# Patient Record
Sex: Male | Born: 1940
Health system: Southern US, Community
[De-identification: ages and names within clinical notes are randomized; demographics above are authoritative.]

## PROBLEM LIST (undated history)

## (undated) DIAGNOSIS — I10 Essential (primary) hypertension: Secondary | ICD-10-CM

## (undated) DIAGNOSIS — R519 Headache, unspecified: Secondary | ICD-10-CM

## (undated) DIAGNOSIS — K635 Polyp of colon: Secondary | ICD-10-CM

## (undated) DIAGNOSIS — I340 Nonrheumatic mitral (valve) insufficiency: Secondary | ICD-10-CM

## (undated) DIAGNOSIS — I2510 Atherosclerotic heart disease of native coronary artery without angina pectoris: Secondary | ICD-10-CM

## (undated) DIAGNOSIS — M1990 Unspecified osteoarthritis, unspecified site: Secondary | ICD-10-CM

## (undated) DIAGNOSIS — R1310 Dysphagia, unspecified: Secondary | ICD-10-CM

## (undated) DIAGNOSIS — E1121 Type 2 diabetes mellitus with diabetic nephropathy: Secondary | ICD-10-CM

## (undated) DIAGNOSIS — E785 Hyperlipidemia, unspecified: Secondary | ICD-10-CM

## (undated) DIAGNOSIS — D696 Thrombocytopenia, unspecified: Secondary | ICD-10-CM

## (undated) DIAGNOSIS — N183 Chronic kidney disease, stage 3 unspecified: Secondary | ICD-10-CM

## (undated) DIAGNOSIS — D500 Iron deficiency anemia secondary to blood loss (chronic): Secondary | ICD-10-CM

## (undated) DIAGNOSIS — C9110 Chronic lymphocytic leukemia of B-cell type not having achieved remission: Secondary | ICD-10-CM

## (undated) DIAGNOSIS — R001 Bradycardia, unspecified: Secondary | ICD-10-CM

## (undated) DIAGNOSIS — E039 Hypothyroidism, unspecified: Secondary | ICD-10-CM

## (undated) DIAGNOSIS — M21949 Unspecified acquired deformity of hand, unspecified hand: Secondary | ICD-10-CM

## (undated) DIAGNOSIS — K922 Gastrointestinal hemorrhage, unspecified: Secondary | ICD-10-CM

## (undated) DIAGNOSIS — R609 Edema, unspecified: Secondary | ICD-10-CM

## (undated) DIAGNOSIS — H409 Unspecified glaucoma: Secondary | ICD-10-CM

## (undated) DIAGNOSIS — N281 Cyst of kidney, acquired: Secondary | ICD-10-CM

## (undated) DIAGNOSIS — I779 Disorder of arteries and arterioles, unspecified: Secondary | ICD-10-CM

## (undated) DIAGNOSIS — R51 Headache: Secondary | ICD-10-CM

## (undated) DIAGNOSIS — D649 Anemia, unspecified: Secondary | ICD-10-CM

## (undated) HISTORY — DX: Gastrointestinal hemorrhage, unspecified: K92.2

## (undated) HISTORY — DX: Hypothyroidism, unspecified: E03.9

## (undated) HISTORY — DX: Chronic lymphocytic leukemia of B-cell type not having achieved remission: C91.10

## (undated) HISTORY — DX: Iron deficiency anemia secondary to blood loss (chronic): D50.0

## (undated) HISTORY — DX: Headache, unspecified: R51.9

## (undated) HISTORY — DX: Thrombocytopenia, unspecified: D69.6

## (undated) HISTORY — DX: Unspecified osteoarthritis, unspecified site: M19.90

## (undated) HISTORY — DX: Edema, unspecified: R60.9

## (undated) HISTORY — DX: Polyp of colon: K63.5

## (undated) HISTORY — DX: Disorder of arteries and arterioles, unspecified: I77.9

## (undated) HISTORY — DX: Nonrheumatic mitral (valve) insufficiency: I34.0

## (undated) HISTORY — DX: Atherosclerotic heart disease of native coronary artery without angina pectoris: I25.10

## (undated) HISTORY — DX: Type 2 diabetes mellitus with diabetic nephropathy: E11.21

## (undated) HISTORY — PX: PTCA: SHX146

## (undated) HISTORY — DX: Chronic kidney disease, stage 3 unspecified: N18.30

## (undated) HISTORY — DX: Chronic kidney disease, stage 3 (moderate): N18.3

## (undated) HISTORY — DX: Bradycardia, unspecified: R00.1

## (undated) HISTORY — DX: Dysphagia, unspecified: R13.10

## (undated) HISTORY — DX: Cyst of kidney, acquired: N28.1

## (undated) HISTORY — DX: Hyperlipidemia, unspecified: E78.5

## (undated) HISTORY — DX: Unspecified acquired deformity of hand, unspecified hand: M21.949

## (undated) HISTORY — DX: Anemia, unspecified: D64.9

## (undated) HISTORY — DX: Headache: R51

## (undated) HISTORY — PX: BACK SURGERY: SHX140

## (undated) HISTORY — DX: Unspecified glaucoma: H40.9

---

## 1960-03-04 DIAGNOSIS — M21949 Unspecified acquired deformity of hand, unspecified hand: Secondary | ICD-10-CM | POA: Insufficient documentation

## 1960-03-04 HISTORY — DX: Unspecified acquired deformity of hand, unspecified hand: M21.949

## 2001-10-22 ENCOUNTER — Encounter (INDEPENDENT_AMBULATORY_CARE_PROVIDER_SITE_OTHER): Payer: Self-pay | Admitting: Specialist

## 2001-10-22 ENCOUNTER — Ambulatory Visit (HOSPITAL_COMMUNITY): Admission: RE | Admit: 2001-10-22 | Discharge: 2001-10-22 | Payer: Self-pay | Admitting: *Deleted

## 2002-05-12 ENCOUNTER — Encounter: Admission: RE | Admit: 2002-05-12 | Discharge: 2002-08-10 | Payer: Self-pay | Admitting: Endocrinology

## 2005-06-26 ENCOUNTER — Encounter: Admission: RE | Admit: 2005-06-26 | Discharge: 2005-06-26 | Payer: Self-pay | Admitting: Nephrology

## 2006-12-01 ENCOUNTER — Ambulatory Visit (HOSPITAL_COMMUNITY): Admission: RE | Admit: 2006-12-01 | Discharge: 2006-12-01 | Payer: Self-pay | Admitting: *Deleted

## 2006-12-01 ENCOUNTER — Encounter (INDEPENDENT_AMBULATORY_CARE_PROVIDER_SITE_OTHER): Payer: Self-pay | Admitting: *Deleted

## 2008-11-02 HISTORY — PX: COLONOSCOPY: SHX174

## 2009-10-24 ENCOUNTER — Ambulatory Visit: Payer: Self-pay | Admitting: Endocrinology

## 2010-03-04 HISTORY — PX: EYE SURGERY: SHX253

## 2010-07-17 NOTE — Op Note (Signed)
NAME:  Scott [name], Scott [name]                ACCOUNT NO.:  [id number]   MEDICAL RECORD NO.:  [id number]          PATIENT TYPE:  AMB   LOCATION:  ENDO                         FACILITY:  [facility]   PHYSICIAN:  Georgiana Spinner, M.D.    DATE OF BIRTH:  [birth year]   DATE OF PROCEDURE:  12/01/2006  DATE OF DISCHARGE:                               OPERATIVE REPORT   PROCEDURE:  Colonoscopy.   INDICATIONS:  Colon polyp.   ANESTHESIA:  Fentanyl 100 mcg, Versed 9 mg.   PROCEDURE:  With the patient mildly sedated in the left lateral  decubitus position, the Pentax videoscopic colonoscope was inserted in  the rectum after what appeared to be a normal rectal exam.  Prostate  felt normal to my examination.  The colonoscope was passed under direct  vision to the cecum identified by ileocecal valve and appendiceal  orifice, both of which were photographed.  From this point the  colonoscope was slowly withdrawn taking circumferential views of colonic  mucosa, stopping at 40 cm from the anal verge at which point a polyp was  seen, photographed and removed using hot biopsy forceps technique,  setting of 20/150 blended current.  We next stopped in the rectum where  a second polyp was seen, photographed and it too was removed using the  hot biopsy forceps technique with the same setting.  We then placed the  endoscope and retroflexed view to view the anal canal from above.  Internal hemorrhoids were seen.  The endoscope was straightened and  withdrawn.  The patient's vital signs and pulse oximeter remained  stable.  The patient tolerated the procedure well without apparent  complications.   FINDINGS:  Diverticulosis scattered throughout the colon but fairly  mild, internal hemorrhoids, polyps as described above 40 cm from anal  verge in the rectum.  Await biopsy report.  The patient will call me for  results and follow-up with me as an outpatient.           ______________________________  Georgiana Spinner,  M.D.     GMO/MEDQ  D:  12/01/2006  T:  12/01/2006  Job:  [id number]

## 2010-07-20 NOTE — Op Note (Signed)
   TNAME:  Aure, FLORIAN CHAUCA                         ACCOUNT NO.:  [id number]   MEDICAL RECORD NO.:  [id number]                   PATIENT TYPE:  AMB   LOCATION:  ENDO                                 FACILITY:  [facility]   PHYSICIAN:  Georgiana Spinner, M.D.                 DATE OF BIRTH:  04-24-1940   DATE OF PROCEDURE:  10/22/2001  DATE OF DISCHARGE:                                 OPERATIVE REPORT   PROCEDURE:  Upper endoscopy.   INDICATIONS FOR PROCEDURE:  Gastroesophageal reflux disease, heme positive.   ANESTHESIA:  Demerol 50, Versed 6 mg.   DESCRIPTION OF PROCEDURE:  With the patient mildly sedated in the left  lateral decubitus position, the Olympus videoscopic endoscope was inserted  in the mouth and passed under direct vision through the esophagus which  appeared normal into the stomach where the fundus was found to be  erythematous and showed changes of snake skinning which was photographed and  biopsied. The body, antrum, duodenal bulb and second portion of the duodenum  all appeared normal. From this point, the endoscope was slowly withdrawn  taking circumferential views, the entire duodenal mucosa visualized,  until  the endoscope was then pulled back in the stomach, placed in retroflexion to  view the stomach from below. The endoscope was straightened and withdrawn  taking circumferential views of the remaining gastric and esophageal mucosa.  The patient's vital signs and pulse oximeter remained stable. The patient  tolerated the procedure well without apparent complications.   FINDINGS:  Changes of gastritis and fundus of stomach biopsied. Await biopsy  report. The patient to call me for results and followup with me as an  outpatient. Proceed to colonoscopy.                                                Georgiana Spinner, M.D.    GMO/MEDQ  D:  10/22/2001  T:  10/23/2001  Job:  [id number]

## 2010-07-20 NOTE — Op Note (Signed)
   TNAME:  Scott [name], ACIE CUSTIS                         ACCOUNT NO.:  [id number]   MEDICAL RECORD NO.:  [id number]                   PATIENT TYPE:  AMB   LOCATION:  ENDO                                 FACILITY:  [facility]   PHYSICIAN:  Georgiana Spinner, M.D.                 DATE OF BIRTH:  [birth year]   DATE OF PROCEDURE:  DATE OF DISCHARGE:                                 OPERATIVE REPORT   PROCEDURE:  Colonoscopy   INDICATIONS:  Hemoccult positivity   ANESTHESIA:  Demerol 20, Versed 2mg    PROCEDURE:  The patient was mildly sedated and laid flat in a decubitus  position. The Olympus videoscopic colonoscope was inserted in the rectum and  passed under direct vision to the cecum, identified by ileocecal valve and  appendiceal orifice. Prep was suboptimal in the cecal area despite washings  and suctioning. From this point, the colonoscope was slowly withdrawn taking  circle views of the entire colon  stopping only in the rectum, which  appeared normal. The rectum showed small hemorrhoids in retroflex. The  endoscope was straightened and withdrawn. The patient's vital signs and  pulse oximeter remained stable. Patient tolerated the procedure well without  complications following a negative colonoscopic examination limited somewhat  by prep in the right colon.   PLAN:  Repeat examination possibly in five years.                                                 Georgiana Spinner, M.D.    GMO/MEDQ  D:  10/22/2001  T:  10/22/2001  Job:  [id number]

## 2011-03-06 DIAGNOSIS — E0789 Other specified disorders of thyroid: Secondary | ICD-10-CM | POA: Diagnosis not present

## 2011-03-06 DIAGNOSIS — E119 Type 2 diabetes mellitus without complications: Secondary | ICD-10-CM | POA: Diagnosis not present

## 2011-03-06 DIAGNOSIS — Z125 Encounter for screening for malignant neoplasm of prostate: Secondary | ICD-10-CM | POA: Diagnosis not present

## 2011-03-06 DIAGNOSIS — I10 Essential (primary) hypertension: Secondary | ICD-10-CM | POA: Diagnosis not present

## 2011-03-06 DIAGNOSIS — E789 Disorder of lipoprotein metabolism, unspecified: Secondary | ICD-10-CM | POA: Diagnosis not present

## 2011-03-06 LAB — CBC
HEMOGLOBIN: 12 g/dL
PLATELET COUNT: 121
WBC: 5

## 2011-03-11 DIAGNOSIS — N289 Disorder of kidney and ureter, unspecified: Secondary | ICD-10-CM | POA: Diagnosis not present

## 2011-03-11 DIAGNOSIS — E119 Type 2 diabetes mellitus without complications: Secondary | ICD-10-CM | POA: Diagnosis not present

## 2011-03-11 DIAGNOSIS — E789 Disorder of lipoprotein metabolism, unspecified: Secondary | ICD-10-CM | POA: Diagnosis not present

## 2011-03-11 DIAGNOSIS — R030 Elevated blood-pressure reading, without diagnosis of hypertension: Secondary | ICD-10-CM | POA: Diagnosis not present

## 2011-03-11 DIAGNOSIS — I10 Essential (primary) hypertension: Secondary | ICD-10-CM | POA: Diagnosis not present

## 2011-03-12 DIAGNOSIS — N2581 Secondary hyperparathyroidism of renal origin: Secondary | ICD-10-CM | POA: Diagnosis not present

## 2011-04-25 DIAGNOSIS — I10 Essential (primary) hypertension: Secondary | ICD-10-CM | POA: Diagnosis not present

## 2011-04-25 DIAGNOSIS — N289 Disorder of kidney and ureter, unspecified: Secondary | ICD-10-CM | POA: Diagnosis not present

## 2011-04-25 DIAGNOSIS — E7800 Pure hypercholesterolemia, unspecified: Secondary | ICD-10-CM | POA: Diagnosis not present

## 2011-04-25 DIAGNOSIS — E119 Type 2 diabetes mellitus without complications: Secondary | ICD-10-CM | POA: Diagnosis not present

## 2011-06-04 DIAGNOSIS — E119 Type 2 diabetes mellitus without complications: Secondary | ICD-10-CM | POA: Diagnosis not present

## 2011-06-04 DIAGNOSIS — E7800 Pure hypercholesterolemia, unspecified: Secondary | ICD-10-CM | POA: Diagnosis not present

## 2011-06-06 DIAGNOSIS — N289 Disorder of kidney and ureter, unspecified: Secondary | ICD-10-CM | POA: Diagnosis not present

## 2011-06-06 DIAGNOSIS — I10 Essential (primary) hypertension: Secondary | ICD-10-CM | POA: Diagnosis not present

## 2011-06-06 DIAGNOSIS — E7800 Pure hypercholesterolemia, unspecified: Secondary | ICD-10-CM | POA: Diagnosis not present

## 2011-07-31 DIAGNOSIS — H409 Unspecified glaucoma: Secondary | ICD-10-CM | POA: Diagnosis not present

## 2011-07-31 DIAGNOSIS — H2510 Age-related nuclear cataract, unspecified eye: Secondary | ICD-10-CM | POA: Diagnosis not present

## 2011-07-31 DIAGNOSIS — H4011X0 Primary open-angle glaucoma, stage unspecified: Secondary | ICD-10-CM | POA: Diagnosis not present

## 2011-08-06 DIAGNOSIS — M722 Plantar fascial fibromatosis: Secondary | ICD-10-CM | POA: Diagnosis not present

## 2011-09-03 DIAGNOSIS — M722 Plantar fascial fibromatosis: Secondary | ICD-10-CM | POA: Diagnosis not present

## 2011-09-04 DIAGNOSIS — E7800 Pure hypercholesterolemia, unspecified: Secondary | ICD-10-CM | POA: Diagnosis not present

## 2011-09-04 LAB — HEMOGLOBIN A1C: A1c: 7.5

## 2011-09-04 LAB — COMPREHENSIVE METABOLIC PANEL
ALT: 23 U/L (ref 10–40)
Creat: 1.9
Glucose: 210
Potassium: 4.4 mmol/L
Sodium: 140 mmol/L (ref 137–147)
Total Bilirubin: 0.6 mg/dL

## 2011-09-04 LAB — LIPID PANEL
Cholesterol, Total: 153
Direct LDL: 84
HDL: 53 mg/dL (ref 35–70)
Triglycerides: 78

## 2011-09-09 DIAGNOSIS — N289 Disorder of kidney and ureter, unspecified: Secondary | ICD-10-CM | POA: Diagnosis not present

## 2011-09-09 DIAGNOSIS — I10 Essential (primary) hypertension: Secondary | ICD-10-CM | POA: Diagnosis not present

## 2011-09-09 DIAGNOSIS — E7800 Pure hypercholesterolemia, unspecified: Secondary | ICD-10-CM | POA: Diagnosis not present

## 2011-09-09 DIAGNOSIS — E1129 Type 2 diabetes mellitus with other diabetic kidney complication: Secondary | ICD-10-CM | POA: Diagnosis not present

## 2011-10-01 DIAGNOSIS — M722 Plantar fascial fibromatosis: Secondary | ICD-10-CM | POA: Diagnosis not present

## 2011-10-09 ENCOUNTER — Observation Stay (HOSPITAL_COMMUNITY)
Admission: EM | Admit: 2011-10-09 | Discharge: 2011-10-11 | Disposition: A | Discharge status: Home / Self Care | Payer: Medicare Other | Attending: Cardiology | Admitting: Cardiology

## 2011-10-09 ENCOUNTER — Emergency Department (HOSPITAL_COMMUNITY): Payer: Medicare Other

## 2011-10-09 ENCOUNTER — Encounter (HOSPITAL_COMMUNITY): Payer: Self-pay | Admitting: Adult Health

## 2011-10-09 DIAGNOSIS — I2510 Atherosclerotic heart disease of native coronary artery without angina pectoris: Secondary | ICD-10-CM | POA: Diagnosis present

## 2011-10-09 DIAGNOSIS — I10 Essential (primary) hypertension: Secondary | ICD-10-CM | POA: Diagnosis present

## 2011-10-09 DIAGNOSIS — N189 Chronic kidney disease, unspecified: Secondary | ICD-10-CM | POA: Insufficient documentation

## 2011-10-09 DIAGNOSIS — E119 Type 2 diabetes mellitus without complications: Secondary | ICD-10-CM | POA: Diagnosis not present

## 2011-10-09 DIAGNOSIS — I498 Other specified cardiac arrhythmias: Secondary | ICD-10-CM | POA: Diagnosis not present

## 2011-10-09 DIAGNOSIS — E039 Hypothyroidism, unspecified: Secondary | ICD-10-CM | POA: Diagnosis present

## 2011-10-09 DIAGNOSIS — R55 Syncope and collapse: Secondary | ICD-10-CM | POA: Diagnosis not present

## 2011-10-09 DIAGNOSIS — I129 Hypertensive chronic kidney disease with stage 1 through stage 4 chronic kidney disease, or unspecified chronic kidney disease: Secondary | ICD-10-CM | POA: Diagnosis not present

## 2011-10-09 DIAGNOSIS — I200 Unstable angina: Secondary | ICD-10-CM

## 2011-10-09 DIAGNOSIS — R079 Chest pain, unspecified: Principal | ICD-10-CM | POA: Insufficient documentation

## 2011-10-09 DIAGNOSIS — Z9861 Coronary angioplasty status: Secondary | ICD-10-CM | POA: Diagnosis not present

## 2011-10-09 DIAGNOSIS — R42 Dizziness and giddiness: Secondary | ICD-10-CM

## 2011-10-09 DIAGNOSIS — R51 Headache: Secondary | ICD-10-CM | POA: Diagnosis not present

## 2011-10-09 DIAGNOSIS — R0602 Shortness of breath: Secondary | ICD-10-CM | POA: Diagnosis not present

## 2011-10-09 DIAGNOSIS — R001 Bradycardia, unspecified: Secondary | ICD-10-CM | POA: Diagnosis present

## 2011-10-09 DIAGNOSIS — R519 Headache, unspecified: Secondary | ICD-10-CM

## 2011-10-09 HISTORY — DX: Essential (primary) hypertension: I10

## 2011-10-09 LAB — BASIC METABOLIC PANEL
BUN: 32 mg/dL — ABNORMAL HIGH (ref 6–23)
CO2: 24 mEq/L (ref 19–32)
Chloride: 111 mEq/L (ref 96–112)
Creatinine, Ser: 2.38 mg/dL — ABNORMAL HIGH (ref 0.50–1.35)

## 2011-10-09 LAB — CBC
HCT: 34.7 % — ABNORMAL LOW (ref 39.0–52.0)
Hemoglobin: 11.6 g/dL — ABNORMAL LOW (ref 13.0–17.0)
MCH: 29.7 pg (ref 26.0–34.0)
MCHC: 33.4 g/dL (ref 30.0–36.0)
RDW: 13.1 % (ref 11.5–15.5)

## 2011-10-09 MED ORDER — NITROGLYCERIN 0.4 MG SL SUBL: 0.4000 mg | SUBLINGUAL_TABLET | SUBLINGUAL | Status: DC | PRN

## 2011-10-09 MED ORDER — ASPIRIN 325 MG PO TABS
325.0000 mg | ORAL_TABLET | ORAL | Status: AC
  Administered 2011-10-09: 325 mg via ORAL
  Filled 2011-10-09: qty 1

## 2011-10-09 NOTE — ED Provider Notes (Signed)
History     CSN: [id number]  Arrival date & time 10/09/11  2122   First MD Initiated Contact with Patient 10/09/11 2145      Chief Complaint  Patient presents with  . Chest Pain    HPI One week of CP feeling dizzy, associated with nausea. Pain located on left side of chest and is a pressure. Pt took one nitro today and it decreased the pressure.  Past Medical History  Diagnosis Date  . Myocardial infarct   . Diabetes mellitus   . Hypertension   . Renal disorder   . Thyroid disease     Past Surgical History  Procedure Date  . Angioplasty     History reviewed. No pertinent family history.  History  Substance Use Topics  . Smoking status: Never Smoker   . Smokeless tobacco: Not on file  . Alcohol Use: No      Review of Systems  All other systems reviewed and are negative.    Allergies  Review of patient's allergies indicates no known allergies.  Home Medications   Current Outpatient Rx  Name Route Sig Dispense Refill  . ASPIRIN 325 MG PO TABS Oral Take 325 mg by mouth daily.    Marland Kitchen CARVEDILOL 25 MG PO TABS Oral Take 25 mg by mouth 2 (two) times daily with a meal.    . FUROSEMIDE 40 MG PO TABS Oral Take 20 mg by mouth daily as needed. For fluid retention    . INSULIN LISPRO (HUMAN) 100 UNIT/ML [employer] SOLN Subcutaneous Inject 20-28 Units into the skin 2 (two) times daily. 28 in a.m. 20 in p.m.    Marland Kitchen LEVOTHYROXINE SODIUM 50 MCG PO TABS Oral Take 50 mcg by mouth daily.    Marland Kitchen NIACIN ER (ANTIHYPERLIPIDEMIC) 1000 MG PO TBCR Oral Take 1,000 mg by mouth at bedtime.    Marland Kitchen ROSUVASTATIN CALCIUM 20 MG PO TABS Oral Take 20 mg by mouth daily.    . TELMISARTAN 80 MG PO TABS Oral Take 80 mg by mouth daily.      BP 143/69  Pulse 61  Temp 97.9 F (36.6 C) (Oral)  Resp 14  SpO2 96%  Physical Exam  Nursing note and vitals reviewed. Constitutional: He is oriented to person, place, and time. He appears well-developed. No distress.  HENT:  Head: Normocephalic and atraumatic.    Eyes: Pupils are equal, round, and reactive to light.  Neck: Normal range of motion.  Cardiovascular: Intact distal pulses.  Bradycardia present.        Sinus bradycardia Rate = 55 Axis: Borderline left axis deviation Interpretation: Abnormal EKG  Pulmonary/Chest: No respiratory distress.  Abdominal: Normal appearance. He exhibits no distension.  Musculoskeletal: Normal range of motion.  Neurological: He is alert and oriented to person, place, and time. No cranial nerve deficit.  Skin: Skin is warm and dry. No rash noted.  Psychiatric: He has a normal mood and affect. His behavior is normal.    ED Course  Procedures (including critical care time)  Labs Reviewed  CBC - Abnormal; Notable for the following:    RBC 3.90 (*)     Hemoglobin 11.6 (*)     HCT 34.7 (*)     Platelets 97 (*)  PLATELET COUNT CONFIRMED BY SMEAR   All other components within normal limits  BASIC METABOLIC PANEL - Abnormal; Notable for the following:    Sodium 146 (*)     Glucose, Bld 104 (*)     BUN 32 (*)  Creatinine, Ser 2.38 (*)     GFR calc non Af Amer 26 (*)     GFR calc Af Amer 30 (*)     All other components within normal limits  POCT I-STAT TROPONIN I   Dg Chest Portable 1 View  10/09/2011  *RADIOLOGY REPORT*  Clinical Data: Chest pain, shortness of breath  PORTABLE CHEST - 1 VIEW  Comparison: 03/11/2011  Findings: Low volume exam.  No focal pneumonia, edema, effusion or pneumothorax. Prominent heart size and vascularity.  IMPRESSION: Low volume exam without acute process  Original Report Authenticated By: Judie Petit. Ruel Favors, M.D.     1. Unstable angina       MDM  Patient admitted to cardiology        Nelia Shi, MD 10/09/11 2351

## 2011-10-09 NOTE — ED Notes (Signed)
One week of CP feeling dizzy, associated with nausea. Pain located on left side of chest and is a pressure. Pt took one nitro today and it decreased the pressure.

## 2011-10-10 ENCOUNTER — Encounter (HOSPITAL_COMMUNITY): Payer: Self-pay | Admitting: Internal Medicine

## 2011-10-10 ENCOUNTER — Inpatient Hospital Stay (HOSPITAL_COMMUNITY): Payer: Medicare Other

## 2011-10-10 DIAGNOSIS — R001 Bradycardia, unspecified: Secondary | ICD-10-CM | POA: Insufficient documentation

## 2011-10-10 DIAGNOSIS — R42 Dizziness and giddiness: Secondary | ICD-10-CM | POA: Diagnosis not present

## 2011-10-10 DIAGNOSIS — I10 Essential (primary) hypertension: Secondary | ICD-10-CM | POA: Diagnosis present

## 2011-10-10 DIAGNOSIS — R51 Headache: Secondary | ICD-10-CM | POA: Diagnosis not present

## 2011-10-10 DIAGNOSIS — R519 Headache, unspecified: Secondary | ICD-10-CM

## 2011-10-10 DIAGNOSIS — I2510 Atherosclerotic heart disease of native coronary artery without angina pectoris: Secondary | ICD-10-CM

## 2011-10-10 DIAGNOSIS — R55 Syncope and collapse: Secondary | ICD-10-CM | POA: Diagnosis not present

## 2011-10-10 DIAGNOSIS — I200 Unstable angina: Secondary | ICD-10-CM

## 2011-10-10 DIAGNOSIS — E039 Hypothyroidism, unspecified: Secondary | ICD-10-CM

## 2011-10-10 HISTORY — DX: Atherosclerotic heart disease of native coronary artery without angina pectoris: I25.10

## 2011-10-10 HISTORY — DX: Bradycardia, unspecified: R00.1

## 2011-10-10 HISTORY — DX: Hypothyroidism, unspecified: E03.9

## 2011-10-10 LAB — CARDIAC PANEL(CRET KIN+CKTOT+MB+TROPI)
CK, MB: 3.5 ng/mL (ref 0.3–4.0)
Relative Index: 2.7 — ABNORMAL HIGH (ref 0.0–2.5)
Total CK: 161 U/L (ref 7–232)
Total CK: 129 U/L (ref 7–232)
Total CK: 107 U/L (ref 7–232)
Troponin I: <0.3 ng/mL (ref <0.30)

## 2011-10-10 LAB — BASIC METABOLIC PANEL
BUN: 30 mg/dL — ABNORMAL HIGH (ref 6–23)
CO2: 23 mEq/L (ref 19–32)
Calcium: 9.3 mg/dL (ref 8.4–10.5)
Creatinine, Ser: 2.27 mg/dL — ABNORMAL HIGH (ref 0.50–1.35)
GFR calc non Af Amer: 27 mL/min — ABNORMAL LOW (ref >90)
Glucose, Bld: 134 mg/dL — ABNORMAL HIGH (ref 70–99)

## 2011-10-10 LAB — CBC
HCT: 31.8 % — ABNORMAL LOW (ref 39.0–52.0)
Hemoglobin: 10.7 g/dL — ABNORMAL LOW (ref 13.0–17.0)
MCH: 30.1 pg (ref 26.0–34.0)
MCH: 29.1 pg (ref 26.0–34.0)
MCHC: 33.6 g/dL (ref 30.0–36.0)
MCV: 89.3 fL (ref 78.0–100.0)
MCV: 90 fL (ref 78.0–100.0)
Platelets: 85 10*3/uL — ABNORMAL LOW (ref 150–400)
RBC: 3.56 MIL/uL — ABNORMAL LOW (ref 4.22–5.81)
RDW: 13.3 % (ref 11.5–15.5)
WBC: 4.4 10*3/uL (ref 4.0–10.5)

## 2011-10-10 LAB — LIPID PANEL
Cholesterol: 116 mg/dL (ref 0–200)
VLDL: 10 mg/dL (ref 0–40)

## 2011-10-10 LAB — GLUCOSE, CAPILLARY
Glucose-Capillary: 124 mg/dL — ABNORMAL HIGH (ref 70–99)
Glucose-Capillary: 122 mg/dL — ABNORMAL HIGH (ref 70–99)
Glucose-Capillary: 154 mg/dL — ABNORMAL HIGH (ref 70–99)

## 2011-10-10 LAB — HEMOGLOBIN A1C: Hgb A1c MFr Bld: 8.2 % — ABNORMAL HIGH (ref <5.7)

## 2011-10-10 LAB — PROTIME-INR: Prothrombin Time: 15.3 seconds — ABNORMAL HIGH (ref 11.6–15.2)

## 2011-10-10 MED ORDER — ASPIRIN 81 MG PO CHEW
324.0000 mg | CHEWABLE_TABLET | ORAL | Status: AC
  Administered 2011-10-10: 324 mg via ORAL
  Filled 2011-10-10: qty 4

## 2011-10-10 MED ORDER — ATORVASTATIN CALCIUM 20 MG PO TABS
20.0000 mg | ORAL_TABLET | Freq: Every day | ORAL | Status: DC
  Administered 2011-10-10: 20 mg via ORAL
  Filled 2011-10-10 (×2): qty 1

## 2011-10-10 MED ORDER — HEPARIN BOLUS VIA INFUSION
4000.0000 [IU] | Freq: Once | INTRAVENOUS | Status: AC
  Administered 2011-10-10: 4000 [IU] via INTRAVENOUS
  Filled 2011-10-10: qty 4000

## 2011-10-10 MED ORDER — HEPARIN (PORCINE) IN NACL 100-0.45 UNIT/ML-% IJ SOLN
1000.0000 [IU]/h | INTRAMUSCULAR | Status: DC
  Administered 2011-10-10 (×2): 1000 [IU]/h via INTRAVENOUS
  Filled 2011-10-10: qty 250

## 2011-10-10 MED ORDER — INSULIN ASPART 100 UNIT/ML ~~LOC~~ SOLN
0.0000 [IU] | Freq: Every day | SUBCUTANEOUS | Status: DC
  Administered 2011-10-10: 3 [IU] via SUBCUTANEOUS

## 2011-10-10 MED ORDER — NIACIN ER 500 MG PO CPCR
1000.0000 mg | ORAL_CAPSULE | Freq: Every day | ORAL | Status: DC
  Administered 2011-10-10: 1000 mg via ORAL
  Filled 2011-10-10 (×3): qty 2

## 2011-10-10 MED ORDER — ONDANSETRON HCL 4 MG/2ML IJ SOLN: 4.0000 mg | Freq: Four times a day (QID) | INTRAMUSCULAR | Status: DC | PRN

## 2011-10-10 MED ORDER — ACETAMINOPHEN 325 MG PO TABS: 650.0000 mg | ORAL_TABLET | ORAL | Status: DC | PRN

## 2011-10-10 MED ORDER — LEVOTHYROXINE SODIUM 50 MCG PO TABS
50.0000 ug | ORAL_TABLET | Freq: Every day | ORAL | Status: DC
  Administered 2011-10-10 – 2011-10-11 (×2): 50 ug via ORAL
  Filled 2011-10-10 (×3): qty 1

## 2011-10-10 MED ORDER — NITROGLYCERIN 0.4 MG SL SUBL: 0.4000 mg | SUBLINGUAL_TABLET | SUBLINGUAL | Status: DC | PRN

## 2011-10-10 MED ORDER — NIACIN ER (ANTIHYPERLIPIDEMIC) 1000 MG PO TBCR: 1000.0000 mg | EXTENDED_RELEASE_TABLET | Freq: Every day | ORAL | Status: DC

## 2011-10-10 MED ORDER — ACETAMINOPHEN 325 MG PO TABS: 650.0000 mg | ORAL_TABLET | Freq: Four times a day (QID) | ORAL | Status: DC | PRN

## 2011-10-10 MED ORDER — CARVEDILOL 25 MG PO TABS
25.0000 mg | ORAL_TABLET | Freq: Two times a day (BID) | ORAL | Status: DC
  Administered 2011-10-10 – 2011-10-11 (×4): 25 mg via ORAL
  Filled 2011-10-10 (×5): qty 1

## 2011-10-10 MED ORDER — HEPARIN (PORCINE) IN NACL 100-0.45 UNIT/ML-% IJ SOLN
900.0000 [IU]/h | INTRAMUSCULAR | Status: DC
  Administered 2011-10-11: 900 [IU]/h via INTRAVENOUS
  Filled 2011-10-10 (×3): qty 250

## 2011-10-10 MED ORDER — SODIUM CHLORIDE 0.9 % IV SOLN
INTRAVENOUS | Status: AC
Start: 2011-10-10 — End: 2011-10-10
  Administered 2011-10-10: 75 mL/h via INTRAVENOUS

## 2011-10-10 MED ORDER — IRBESARTAN 300 MG PO TABS
300.0000 mg | ORAL_TABLET | Freq: Every day | ORAL | Status: DC
  Administered 2011-10-10 – 2011-10-11 (×2): 300 mg via ORAL
  Filled 2011-10-10 (×2): qty 1

## 2011-10-10 MED ORDER — ACETAMINOPHEN 325 MG PO TABS
650.0000 mg | ORAL_TABLET | Freq: Four times a day (QID) | ORAL | Status: DC | PRN
  Administered 2011-10-10: 650 mg via ORAL
  Filled 2011-10-10: qty 2

## 2011-10-10 MED ORDER — ASPIRIN EC 81 MG PO TBEC
81.0000 mg | DELAYED_RELEASE_TABLET | Freq: Every day | ORAL | Status: DC
  Administered 2011-10-11: 81 mg via ORAL
  Filled 2011-10-10 (×2): qty 1

## 2011-10-10 MED ORDER — INSULIN ASPART 100 UNIT/ML ~~LOC~~ SOLN
0.0000 [IU] | Freq: Three times a day (TID) | SUBCUTANEOUS | Status: DC
  Administered 2011-10-10 – 2011-10-11 (×3): 3 [IU] via SUBCUTANEOUS
  Administered 2011-10-11: 2 [IU] via SUBCUTANEOUS

## 2011-10-10 MED ORDER — ASPIRIN EC 81 MG PO TBEC: 81.0000 mg | DELAYED_RELEASE_TABLET | Freq: Every day | ORAL | Status: DC

## 2011-10-10 MED ORDER — ASPIRIN 300 MG RE SUPP
300.0000 mg | RECTAL | Status: AC
  Filled 2011-10-10: qty 1

## 2011-10-10 NOTE — Progress Notes (Signed)
Patient ID: Scott [name], male   DOB: 1940/08/11, 71 y.o.   MRN: [id number]   I spoke with Dr. Briant Cedar about the patient concerning his renal status. He will obtain his office records and put them in the chart. We think that his creatinine is close to baseline. He will not be doing a formal renal consult unless we need further help.  Jerral Bonito, MD

## 2011-10-10 NOTE — Progress Notes (Signed)
ANTICOAGULATION CONSULT NOTE - Follow Up Consult  Pharmacy Consult for Heparin Indication: chest pain/ACS  No Known Allergies  Patient Measurements: Height: 6\' 1"  (185.4 cm) Weight: 190 lb 11.2 oz (86.501 kg) IBW/kg (Calculated) : 79.9  Heparin Dosing Weight: 86.5kg  Vital Signs: Temp: 97.1 F (36.2 C) (08/08 2016) Temp src: Oral (08/08 2016) BP: 140/70 mmHg (08/08 2056) Pulse Rate: 52  (08/08 2016)  Labs:  Basename 10/10/11 2141 10/10/11 1708 10/10/11 1034 10/10/11 0847 10/10/11 0846 10/10/11 0144 10/09/11 2138  HGB 9.9* -- -- -- 10.7* -- --  HCT 30.6* -- -- -- 31.8* -- 34.7*  PLT 85* -- -- -- 89* -- 97*  APTT -- -- -- -- -- 34 --  LABPROT -- -- -- -- -- 15.3* --  INR -- -- -- -- -- 1.18 --  HEPARINUNFRC 0.22* -- 1.19* -- -- -- --  CREATININE -- -- -- -- 2.27* -- 2.38*  CKTOTAL -- 107 -- 129 -- 161 --  CKMB -- 3.3 -- 3.5 -- 4.4* --  TROPONINI -- <0.30 -- <0.30 -- <0.30 --    Estimated Creatinine Clearance: 33.7 ml/min (by C-G formula based on Cr of 2.27).   Medications:  Heparin infusion 750 units/hr  Assessment: 71yom on heparin for [place]. Heparin is now subtherapeutic on 750 units/hr.  Hgb continues to trend down, PLTC stable ~ 90K, no bleeding noted.  Pt may accumulate drug with SCr 2.2.  Will increase cautiously.  Goal of Therapy:  Heparin level 0.3-0.7 units/ml Monitor platelets by anticoagulation protocol: Yes   Plan:  1. Increase heparin to 900 units/hr. 2. Check heparin level and CBC with AM labs.   3. Monitor for s/s of bleeding.  [employer], Pharm.D., BCPS Clinical Pharmacist Pager [phone] 10/10/2011 10:28 PM

## 2011-10-10 NOTE — Care Management Note (Signed)
    Page 1 of 1   10/10/2011     10:48:55 AM   CARE MANAGEMENT NOTE 10/10/2011  Patient:  Scott [name], Scott [name]   Account Number:  [id number]  Date Initiated:  10/10/2011  Documentation initiated by:  Avie Arenas  Subjective/Objective Assessment:   Admitted with CP and syncope.  Has spouse.     Action/Plan:   Anticipated DC Date:  10/11/2011   Anticipated DC Plan:  HOME/SELF CARE      DC Planning Services  CM consult      Choice offered to / List presented to:             Status of service:  In process, will continue to follow Medicare Important Message given?   (If response is "NO", the following Medicare IM given date fields will be blank) Date Medicare IM given:   Date Additional Medicare IM given:    Discharge Disposition:    Per UR Regulation:  Reviewed for med. necessity/level of care/duration of stay  If discussed at Long Length of Stay Meetings, dates discussed:    Comments:  PCP- Dr. Juleen China - Dr. Briant Cedar for renal  Contact:  Wife - Scott [name]   [phone]

## 2011-10-10 NOTE — Progress Notes (Signed)
ANTICOAGULATION CONSULT NOTE - Follow Up Consult  Pharmacy Consult for Heparin Indication: chest pain/ACS  No Known Allergies  Patient Measurements: Height: 6\' 1"  (185.4 cm) Weight: 190 lb 11.2 oz (86.501 kg) IBW/kg (Calculated) : 79.9  Heparin Dosing Weight: 86.5kg  Vital Signs: Temp: 97.4 F (36.3 C) (08/08 0430) Temp src: Oral (08/08 0430) BP: 117/68 mmHg (08/08 1005) Pulse Rate: 60  (08/08 1005)  Labs:  Basename 10/10/11 1034 10/10/11 0847 10/10/11 0846 10/10/11 0144 10/09/11 2138  HGB -- -- 10.7* -- 11.6*  HCT -- -- 31.8* -- 34.7*  PLT -- -- 89* -- 97*  APTT -- -- -- 34 --  LABPROT -- -- -- 15.3* --  INR -- -- -- 1.18 --  HEPARINUNFRC 1.19* -- -- -- --  CREATININE -- -- 2.27* -- 2.38*  CKTOTAL -- 129 -- 161 --  CKMB -- 3.5 -- 4.4* --  TROPONINI -- <0.30 -- <0.30 --    Estimated Creatinine Clearance: 33.7 ml/min (by C-G formula based on Cr of 2.27).   Medications:  Heparin 1000 units/hr  Assessment: 71yom on heparin for [place]. Heparin level (1.19) is supratherpautic - will hold heparin drip ~1hr and then restart at reduced rate. Spoke with patient - verified labs were drawn from opposite arm and no bleeding was reported. - Hg 10.7 - trending down, monitor - Plts 89 - trending down, monitor  Goal of Therapy:  Heparin level 0.3-0.7 units/ml Monitor platelets by anticoagulation protocol: Yes   Plan:  1. Hold heparin drip ~ 1hr (turned off by RN ~1150). Restart heparin drip 750 units/hr (7.5 ml/hr) at 1300 2. Check heparin level and CBC 8 hours after restart 3. Monitor for s/s of bleeding  Cleon Dew [phone] 10/10/2011,12:04 PM

## 2011-10-10 NOTE — H&P (Signed)
Cardiology H&P  Primary Care Povider: No primary provider on file. Primary Cardiologist: Wall  HPI: [name] is a 71 y.o.male previously followed by Dr. Daleen Squibb who presents with unstable angina and near syncope.  He reports chest discomfort with mild exertion that has been occurring intermittently over the last few months.  Recently he has had a few episodes at rest.  He took a nitro tonight with relief but normally it just relieves with rest.  He also reports episodes of feeling dizzy and "head spinning" particuarly when he stands up from a sitting position.  This usually resolves with sitting.  This also seemed worse today. He has never had syncope.  He currently is pain free without EKG changes.  He has not had orthopnea or PND or swelling.  He takes lasix as needed but has not taken it recently.  This pain is not similar to his previous heart attack but it is exertional.  He has no other symptoms.   Past Medical History  Diagnosis Date  . Myocardial infarct 1990s    Angioplasty  . Diabetes mellitus   . Hypertension   . CKD (chronic kidney disease)   . Thyroid disease     Past Surgical History  Procedure Date  . Angioplasty   . Back surgery     Family History  Problem Relation Age of Onset  . Coronary artery disease Son   . Diabetes Sister   . Stomach cancer Sister     Social History:  reports that he has never smoked. He does not have any smokeless tobacco history on file. He reports that he does not drink alcohol or use illicit drugs.  Allergies: No Known Allergies  Current Facility-Administered Medications  Medication Dose Route Frequency Provider Last Rate Last Dose  . aspirin tablet 325 mg  325 mg Oral STAT Nelia Shi, MD   325 mg at 10/09/11 2141  . DISCONTD: nitroGLYCERIN (NITROSTAT) SL tablet 0.4 mg  0.4 mg Sublingual Q5 min PRN Nelia Shi, MD       Current Outpatient Prescriptions  Medication Sig Dispense Refill  . aspirin 325 MG tablet Take 325 mg by  mouth daily.      . carvedilol (COREG) 25 MG tablet Take 25 mg by mouth 2 (two) times daily with a meal.      . furosemide (LASIX) 40 MG tablet Take 20 mg by mouth daily as needed. For fluid retention      . insulin lispro (HUMALOG) 100 UNIT/ML injection Inject 20-28 Units into the skin 2 (two) times daily. 28 in a.m. 20 in p.m.      Marland Kitchen levothyroxine (SYNTHROID, LEVOTHROID) 50 MCG tablet Take 50 mcg by mouth daily.      . niacin (NIASPAN) 1000 MG CR tablet Take 1,000 mg by mouth at bedtime.      . rosuvastatin (CRESTOR) 20 MG tablet Take 20 mg by mouth daily.      Marland Kitchen telmisartan (MICARDIS) 80 MG tablet Take 80 mg by mouth daily.        ROS: A full review of systems is obtained and is negative except as noted in the HPI.  Physical Exam: Blood pressure 143/69, pulse 61, temperature 97.9 F (36.6 C), temperature source Oral, resp. rate 14, SpO2 96.00%.  GENERAL: no acute distress.  EYES: Extra ocular movements are intact. There is no lid lag. Sclera is anicteric.  ENT: Oropharynx is clear. Dentition is within normal limits.  NECK: Supple. The thyroid is not  enlarged.  LYMPH: There are no masses or lymphadenopathy present.  HEART: Regular rate and rhythm with no m/g/r.  Normal S1/S2. No JVD LUNGS: Clear to auscultation There are no rales, rhonchi, or wheezes.  ABDOMEN: Soft, non-tender, and non-distended with normoactive bowel sounds. There is no hepatosplenomegaly.  EXTREMITIES: No clubbing, cyanosis, or edema.  PULSES: . DP/PT pulses were +2 and equal bilaterally.  SKIN: Warm, dry, and intact.  NEUROLOGIC: The patient was oriented to person, place, and time. No overt neurologic deficits were detected.  PSYCH: Normal judgment and insight, mood is appropriate.   Results: Results for orders placed during the hospital encounter of 10/09/11 (from the past 24 hour(s))  CBC     Status: Abnormal   Collection Time   10/09/11  9:38 PM      Component Value Range   WBC 5.9  4.0 - 10.5 K/uL   RBC  3.90 (*) 4.22 - 5.81 MIL/uL   Hemoglobin 11.6 (*) 13.0 - 17.0 g/dL   HCT 47.8 (*) 29.5 - 62.1 %   MCV 89.0  78.0 - 100.0 fL   MCH 29.7  26.0 - 34.0 pg   MCHC 33.4  30.0 - 36.0 g/dL   RDW 30.8  65.7 - 84.6 %   Platelets 97 (*) 150 - 400 K/uL  BASIC METABOLIC PANEL     Status: Abnormal   Collection Time   10/09/11  9:38 PM      Component Value Range   Sodium 146 (*) 135 - 145 mEq/L   Potassium 3.8  3.5 - 5.1 mEq/L   Chloride 111  96 - 112 mEq/L   CO2 24  19 - 32 mEq/L   Glucose, Bld 104 (*) 70 - 99 mg/dL   BUN 32 (*) 6 - 23 mg/dL   Creatinine, Ser 9.62 (*) 0.50 - 1.35 mg/dL   Calcium 95.2  8.4 - 84.1 mg/dL   GFR calc non Af Amer 26 (*) >90 mL/min   GFR calc Af Amer 30 (*) >90 mL/min  POCT I-STAT TROPONIN I     Status: Normal   Collection Time   10/09/11  9:53 PM      Component Value Range   Troponin i, poc 0.00  0.00 - 0.08 ng/mL   Comment 3             EKG: NSR without STT changes CXR: clear  Assessment/Plan: 71 yo WM with CAD and prior AMI s/p angioplasty in the 1990s who is here with unstable angina and near syncope. 1. Unstable Angina - ASA 325 and heparin ggt - trend cardiac enzymes - continue BB, statin, ARB - cath vs functional study tomorrow  2. Near syncope: orthostatic by history, also consider bradyarrhythmias - telemetry - check 2D echo in AM    Ridgely Anastacio 10/10/2011, 12:30 AM

## 2011-10-10 NOTE — Progress Notes (Signed)
Patient ID: Scott [name], male   DOB: [birth year], 71 y.o.   MRN: [id number]   SUBJECTIVE: The patient's primary M.D. Is Dr.Kohut. He is followed by Dr. Briant Cedar for chronic kidney disease. The patient relates a history of coronary disease. He has been seen by Dr. Daleen Squibb in the past. He relates to catheterization that was done in 1990. These records are not available at this time. I have searched the old: System. It is not clear if he had any type of intervention. He has not seen cardiology in many years.  He describes feeling a sensation of dizziness and some mild abdominal discomfort and fleeting localized chest pain. This became worse yesterday became to the hospital. He also has had a mild headache. He thinks his headache is improving since he is been here. He's not had any recurrent chest pain.  His headache continued when he did not sleep during the night. He says it is improving this morning.  Filed Vitals:   10/09/11 2228 10/09/11 2320 10/10/11 0112 10/10/11 0430  BP: 141/73 143/69 147/68 147/74  Pulse: 56 61 53 50  Temp:   97.1 F (36.2 C) 97.4 F (36.3 C)  TempSrc:   Oral Oral  Resp:  14 18 18   Height:   6\' 1"  (1.854 m)   Weight:   190 lb 11.2 oz (86.501 kg)   SpO2: 98% 96% 96% 96%    Intake/Output Summary (Last 24 hours) at 10/10/11 0746 Last data filed at 10/10/11 0645  Gross per 24 hour  Intake    370 ml  Output    600 ml  Net   -230 ml    LABS: Basic Metabolic Panel:  Basename 10/09/11 2138  NA 146*  K 3.8  CL 111  CO2 24  GLUCOSE 104*  BUN 32*  CREATININE 2.38*  CALCIUM 10.0  MG --  PHOS --   Liver Function Tests: No results found for this basename: AST:2,ALT:2,ALKPHOS:2,BILITOT:2,PROT:2,ALBUMIN:2 in the last 72 hours No results found for this basename: LIPASE:2,AMYLASE:2 in the last 72 hours CBC:  Basename 10/09/11 2138  WBC 5.9  NEUTROABS --  HGB 11.6*  HCT 34.7*  MCV 89.0  PLT 97*   Cardiac Enzymes:  Basename 10/10/11 0144  CKTOTAL 161    CKMB 4.4*  CKMBINDEX --  TROPONINI <0.30   BNP: No components found with this basename: POCBNP:3 D-Dimer: No results found for this basename: DDIMER:2 in the last 72 hours Hemoglobin A1C: No results found for this basename: HGBA1C in the last 72 hours Fasting Lipid Panel: No results found for this basename: CHOL,HDL,LDLCALC,TRIG,CHOLHDL,LDLDIRECT in the last 72 hours Thyroid Function Tests: No results found for this basename: TSH,T4TOTAL,FREET3,T3FREE,THYROIDAB in the last 72 hours  RADIOLOGY: Dg Chest Portable 1 View  10/09/2011  *RADIOLOGY REPORT*  Clinical Data: Chest pain, shortness of breath  PORTABLE CHEST - 1 VIEW  Comparison: 03/11/2011  Findings: Low volume exam.  No focal pneumonia, edema, effusion or pneumothorax. Prominent heart size and vascularity.  IMPRESSION: Low volume exam without acute process  Original Report Authenticated By: Judie Petit. Ruel Favors, M.D.    PHYSICAL EXAM  Patient is oriented to person time and place. Affect is normal. His family is in the room at the time of my visit. Head is atraumatic. There is no jugulovenous distention. Lungs reveal scattered rhonchi. Cardiac exam reveals S1 and S2. There no clicks or significant murmurs. Abdomen is soft. There is no peripheral edema.   TELEMETRY:  I have personally reviewed telemetry today October 10, 2011,. There is normal sinus rhythm.   ASSESSMENT AND PLAN:   Unstable angina    The patient has had fleeting chest pain. It is not yet clear if this coronary origin. So far we have only one troponin value. We will have to wait further troponins before further testing. In addition we will need input from his nephrology team to see what his baseline creatinine is as we have no labs available. Will need a recommendation as to whether or not we can proceed with catheterization.   CAD (coronary artery disease)     The patient gives a history of coronary disease with catheterization in the 1990s. At this point I cannot  access any of this information. We think he did not have any definite intervention but we're not sure.   Diabetes mellitus    Patient is on meds for his diabetes. I last for help with internal medicine if his glucoses not controlled.   Hypertension     Blood pressure is stable at this time.   Chronic kidney disease     Patient has known chronic kidney disease but I do not know what his baseline level is. We will need help from the nephrology team.   Hypothyroidism     The patient is on thyroid medication. TSH will be checked.   Dizziness    Etiology of his dizziness yesterday is not clear. He has mild bradycardia. His blood pressure has not been low. We'll continue to monitor him.   Bradycardia.    Patient has bradycardia but it is mild. He is now on a beta blocker. He will be monitored.   Headache    Patient is having a mild persistent headache. In addition he had significant dizziness. Etiology is not clear. I will proceed with a noncontrast head CT to be sure there is no obvious pathology as the patient is receiving heparin and may need to receive other medications.  The patient will be allowed to eat this a.m. As we will not be proceeding with any Or exercise study. Information will be gathered as outlined above. I will schedule a nuclear stress study for tomorrow pending other of evaluation and further cardiac enzymes.  Willa Rough 10/10/2011 7:46 AM

## 2011-10-10 NOTE — Progress Notes (Signed)
Patient ID: Scott [name], male   DOB: 15-Aug-1940, 71 y.o.   MRN: [id number]   Medical records has found the cath report from 1995.:   Remote PTCA 1994, chronic reocclusion distal right with collaterals from circumflex.   08/24/1993  PTCA of Dx-OM2 proximal concentric stenosis, good LV function and and and walks in

## 2011-10-10 NOTE — ED Notes (Signed)
REPORT GIVEN TO FLOOR NURSE , TRANSPORTED TO FLOOR IN STABLE CONDITION , NO CHEST PAIN AT TIME OF TRANSPORT. RESPIRATIONS UNLABORED.

## 2011-10-10 NOTE — Progress Notes (Signed)
ANTICOAGULATION CONSULT NOTE - Initial Consult  Pharmacy Consult for heparin Indication: chest pain/ACS  No Known Allergies  Patient Measurements: Height: 6\' 1"  (185.4 cm) Weight: 190 lb 11.2 oz (86.501 kg) IBW/kg (Calculated) : 79.9  Heparin Dosing Weight: 82 kg  Vital Signs: Temp: 97.1 F (36.2 C) (08/08 0112) Temp src: Oral (08/08 0112) BP: 147/68 mmHg (08/08 0112) Pulse Rate: 53  (08/08 0112)  Labs:  Alvira Philips 10/09/11 2138  HGB 11.6*  HCT 34.7*  PLT 97*  APTT --  LABPROT --  INR --  HEPARINUNFRC --  CREATININE 2.38*  CKTOTAL --  CKMB --  TROPONINI --    Estimated Creatinine Clearance: 32.2 ml/min (by C-G formula based on Cr of 2.38).   Medical History: Past Medical History  Diagnosis Date  . Myocardial infarct 1990s    Angioplasty  . Diabetes mellitus   . Hypertension   . CKD (chronic kidney disease)   . Thyroid disease     Medications:  Scheduled:    . aspirin EC  81 mg Oral Daily  . aspirin  325 mg Oral STAT  . atorvastatin  20 mg Oral q1800  . carvedilol  25 mg Oral BID WC  . insulin aspart  0-15 Units Subcutaneous TID WC  . insulin aspart  0-5 Units Subcutaneous QHS  . irbesartan  300 mg Oral Daily  . levothyroxine  50 mcg Oral QAC breakfast  . niacin  1,000 mg Oral QHS  . DISCONTD: aspirin EC  81 mg Oral Daily  . DISCONTD: niacin  1,000 mg Oral QHS    Assessment: 71 yo male with h/o AMI admitted with unstable angina. Pharmacy consulted to manage IV heparin. Noted baseline Hgb 11.6 and Plt 97.   Goal of Therapy:  Heparin level 0.3-0.7 units/ml Monitor platelets by anticoagulation protocol: Yes   Plan:  1. Heparin IV bolus of 4000 units x 1,  then IV infusion of 1000 units/hr.  2. Heparin level in 8 hours.  3. Daily CBC, heparin level.   Emeline Gins 10/10/2011,1:44 AM

## 2011-10-11 ENCOUNTER — Inpatient Hospital Stay (HOSPITAL_COMMUNITY): Payer: Medicare Other

## 2011-10-11 ENCOUNTER — Encounter (HOSPITAL_COMMUNITY): Payer: Self-pay | Admitting: Physician Assistant

## 2011-10-11 DIAGNOSIS — D693 Immune thrombocytopenic purpura: Secondary | ICD-10-CM | POA: Insufficient documentation

## 2011-10-11 DIAGNOSIS — D649 Anemia, unspecified: Secondary | ICD-10-CM

## 2011-10-11 DIAGNOSIS — I517 Cardiomegaly: Secondary | ICD-10-CM

## 2011-10-11 DIAGNOSIS — D631 Anemia in chronic kidney disease: Secondary | ICD-10-CM | POA: Insufficient documentation

## 2011-10-11 DIAGNOSIS — D696 Thrombocytopenia, unspecified: Secondary | ICD-10-CM

## 2011-10-11 DIAGNOSIS — R079 Chest pain, unspecified: Secondary | ICD-10-CM | POA: Diagnosis not present

## 2011-10-11 HISTORY — DX: Thrombocytopenia, unspecified: D69.6

## 2011-10-11 HISTORY — DX: Anemia, unspecified: D64.9

## 2011-10-11 LAB — BASIC METABOLIC PANEL
BUN: 29 mg/dL — ABNORMAL HIGH (ref 6–23)
CO2: 22 mEq/L (ref 19–32)
Calcium: 9.6 mg/dL (ref 8.4–10.5)
Creatinine, Ser: 2.33 mg/dL — ABNORMAL HIGH (ref 0.50–1.35)

## 2011-10-11 LAB — CBC
HCT: 34 % — ABNORMAL LOW (ref 39.0–52.0)
MCH: 30.4 pg (ref 26.0–34.0)
MCV: 90.7 fL (ref 78.0–100.0)
Platelets: 92 10*3/uL — ABNORMAL LOW (ref 150–400)
RBC: 3.75 MIL/uL — ABNORMAL LOW (ref 4.22–5.81)

## 2011-10-11 LAB — GLUCOSE, CAPILLARY: Glucose-Capillary: 141 mg/dL — ABNORMAL HIGH (ref 70–99)

## 2011-10-11 MED ORDER — NITROGLYCERIN 0.4 MG SL SUBL: 0.4000 mg | SUBLINGUAL_TABLET | SUBLINGUAL | Status: DC | PRN

## 2011-10-11 MED ORDER — REGADENOSON 0.4 MG/5ML IV SOLN
0.4000 mg | Freq: Once | INTRAVENOUS | Status: AC
  Administered 2011-10-11: 0.4 mg via INTRAVENOUS
  Filled 2011-10-11: qty 5

## 2011-10-11 MED ORDER — TECHNETIUM TC 99M TETROFOSMIN IV KIT
10.0000 | PACK | Freq: Once | INTRAVENOUS | Status: AC | PRN
  Administered 2011-10-11: 10 via INTRAVENOUS

## 2011-10-11 MED ORDER — REGADENOSON 0.4 MG/5ML IV SOLN
INTRAVENOUS | Status: AC
  Administered 2011-10-11: 0.4 mg
  Filled 2011-10-11: qty 5

## 2011-10-11 MED ORDER — TECHNETIUM TC 99M TETROFOSMIN IV KIT
30.0000 | PACK | Freq: Once | INTRAVENOUS | Status: AC | PRN
  Administered 2011-10-11: 30 via INTRAVENOUS

## 2011-10-11 NOTE — Progress Notes (Signed)
Patient ID: Scott [name], male   DOB: 1941/01/26, 71 y.o.   MRN: [id number] Patient in stress lab. Chart reviewed. If no significant ischemia, will treat medically and could go home this evening.

## 2011-10-11 NOTE — Progress Notes (Signed)
  Echocardiogram 2D Echocardiogram has been performed.  Scott [name] 10/11/2011, 11:05 AM

## 2011-10-11 NOTE — Progress Notes (Signed)
Pt d/c home with instructions, f/u appt, and r/x. Pt and son verbalized understanding of instructions, pt home with son, escorted self out per pt request.

## 2011-10-11 NOTE — Progress Notes (Signed)
  ANTICOAGULATION CONSULT NOTE - Follow Up Consult  Pharmacy Consult for Heparin Indication: [place]  No Known Allergies  Patient Measurements: Height: 6\' 1"  (185.4 cm) Weight: 190 lb 11.2 oz (86.501 kg) IBW/kg (Calculated) : 79.9  Heparin Dosing Weight: 86.5kg  Vital Signs: Temp: 97.2 F (36.2 C) (08/09 0418) Temp src: Oral (08/09 0418) BP: 147/74 mmHg (08/09 0852) Pulse Rate: 55  (08/09 0852)  Labs:  Basename 10/11/11 0650 10/10/11 2141 10/10/11 1708 10/10/11 1034 10/10/11 0847 10/10/11 0846 10/10/11 0144 10/09/11 2138  HGB 11.4* 9.9* -- -- -- -- -- --  HCT 34.0* 30.6* -- -- -- 31.8* -- --  PLT 92* 85* -- -- -- 89* -- --  APTT -- -- -- -- -- -- 34 --  LABPROT -- -- -- -- -- -- 15.3* --  INR -- -- -- -- -- -- 1.18 --  HEPARINUNFRC 0.31 0.22* -- 1.19* -- -- -- --  CREATININE 2.33* -- -- -- -- 2.27* -- 2.38*  CKTOTAL -- -- 107 -- 129 -- 161 --  CKMB -- -- 3.3 -- 3.5 -- 4.4* --  TROPONINI -- -- <0.30 -- <0.30 -- <0.30 --    Estimated Creatinine Clearance: 32.9 ml/min (by C-G formula based on Cr of 2.33).   Medications:  Heparin 900 units/hr  Assessment: 71yom on heparin for [place]. Heparin level (0.31) is now therapeutic after rate increase last PM. Patient is currently having a stress test performed - will leave heparin at current rate and follow-up test results.  - H/H and Plts low but improving - No significant bleeding reported  Goal of Therapy:  Heparin level 0.3-0.7 units/ml Monitor platelets by anticoagulation protocol: Yes   Plan:  1. Continue heparin drip 900 units/hr (9 ml/hr) 2. Follow-up stress test results - may need to adjust heparin rate up slightly if significant ischemia occurs 3. Daily heparin level and CBC  Cleon Dew [phone] 10/11/2011,8:57 AM

## 2011-10-11 NOTE — Discharge Summary (Signed)
CARDIOLOGY DISCHARGE SUMMARY   Patient ID: Scott [name] MRN: [id number] DOB/AGE: 1941/01/14 71 y.o.  Admit date: 10/09/2011 Discharge date: 10/11/2011  Primary Discharge Diagnosis:  Chest pain - enzymes negative for MI, MV negative for ischemia  Secondary Discharge Diagnosis:  Past Medical History  Diagnosis Date  . Myocardial infarct 1990s    Angioplasty  . Diabetes mellitus   . Hypertension   . CKD (chronic kidney disease)    Remote PTCA 1994, chronic reocclusion distal right with collaterals from circumflex.  08/24/1993 PTCA of Dx-OM2 proximal concentric stenosis, good LV function   . Thyroid disease    Procedures: CT head w/o contrast, Lexiscan MV  Hospital Course: Scott [name] is a 71 year old male with a history of CAD. He had chest pain and came to the ER, where he was admitted for further evaluation and treatment.  Scott [name] also complained of orthostatic dizziness. He was in sinus bradycardia at times but no sustained heart rates less than 50, so his Coreg was continued. He had no critical arrhythmias overnight. His renal function is abnormal and he is to follow up with primary care for this as well as for his thrombocytopenia and anemia. His cardiac enzymes were negative for MI. He had a Lexiscan MV which was negative for ischemia or infarct. He was ambulating without chest pain or SOB and considered stable for discharge, to follow up as an outpatient with cardiology and primary care.  Labs:   Lab Results  Component Value Date   WBC 4.4 10/11/2011   HGB 11.4* 10/11/2011   HCT 34.0* 10/11/2011   MCV 90.7 10/11/2011   PLT 92* 10/11/2011    Lab 10/11/11 0650  NA 147*  K 4.4  CL 113*  CO2 22  BUN 29*  CREATININE 2.33*  CALCIUM 9.6  PROT --  BILITOT --  ALKPHOS --  ALT --  AST --  GLUCOSE 130*    Basename 10/10/11 1708 10/10/11 0847 10/10/11 0144  CKTOTAL 107 129 161  CKMB 3.3 3.5 4.4*  CKMBINDEX -- -- --  TROPONINI <0.30 <0.30 <0.30   Lipid Panel     Component  Value Date/Time   CHOL 116 10/10/2011 0846   TRIG 49 10/10/2011 0846   HDL 54 10/10/2011 0846   CHOLHDL 2.1 10/10/2011 0846   VLDL 10 10/10/2011 0846   LDLCALC 52 10/10/2011 0846    Basename 10/10/11 0144  INR 1.18   Radiology: Ct Head Wo Contrast 10/10/2011  *RADIOLOGY REPORT*  Clinical Data: Syncopal episodes.  Dizziness.  Headaches.  CT HEAD WITHOUT CONTRAST  Technique:  Contiguous axial images were obtained from the base of the skull through the vertex without contrast.  Comparison: None.  Findings: No acute cortical infarct, hemorrhage, mass lesion is present.  Mild generalized atrophy is likely within normal limits for age.  The ventricles are of normal size.  No significant extra- axial fluid collection is present.  Vascular calcifications are present within the cavernous carotid arteries and at the dural margins of the vertebral arteries bilaterally.  The paranasal sinuses and mastoid air cells are clear.  The osseous skull is intact.  IMPRESSION:  1.  Normal CT appearance of the brain for age. 2.  Atherosclerosis.  Original Report Authenticated By: Jamesetta Orleans. MATTERN, M.D.   Nm Myocar Multi W/spect W/wall Motion / Ef 10/11/2011  *RADIOLOGY REPORT*  Clinical Data:  Chest pain  MYOCARDIAL IMAGING WITH SPECT (REST AND PHARMACOLOGIC-STRESS) GATED LEFT VENTRICULAR WALL MOTION STUDY LEFT VENTRICULAR EJECTION  FRACTION  Technique:  Standard myocardial SPECT imaging was performed after resting intravenous injection of 10 mCi Tc-33m tetrofosmin. Subsequently, intravenous infusion of regadenoson was performed under the supervision of the Cardiology staff.  At peak effect of the drug, 30 mCi Tc-15m tetrofosmin was injected intravenously and standard myocardial SPECT  imaging was performed.  Quantitative gated imaging was also performed to evaluate left ventricular wall motion, and estimate left ventricular ejection fraction.  Comparison:  None.  Findings: Lexiscan stress ECG showed no changes with infusion  (reported separately).  Gated images showed normal wall motion, EF 60%.  On resting images, there was a small mild apical septal perfusion defect.  On stress images, there was a small mild apical septal perfusion defect.  IMPRESSION: Fixed, small mild apical septal perfusion defect with normal wall motion likely represents apical thinning. No evidence for ischemia or infarction.  Normal LV systolic function.  Original Report Authenticated By: [email or web address]   Dg Chest Portable 1 View 10/09/2011  *RADIOLOGY REPORT*  Clinical Data: Chest pain, shortness of breath  PORTABLE CHEST - 1 VIEW  Comparison: 03/11/2011  Findings: Low volume exam.  No focal pneumonia, edema, effusion or pneumothorax. Prominent heart size and vascularity.  IMPRESSION: Low volume exam without acute process  Original Report Authenticated By: Judie Petit. Ruel Favors, M.D.   EKG: 10-Oct-2011 03:30:43 Sinus bradycardia PVC Abnormal ECG Vent. rate 53 BPM PR interval 136 ms QRS duration 86 ms QT/QTc 458/429 ms P-R-T axes * 194 153  FOLLOW UP PLANS AND APPOINTMENTS No Known Allergies      BRING ALL MEDICATIONS WITH YOU TO FOLLOW UP APPOINTMENTS  Time spent with patient to include physician time: 32 min Signed: Theodore Demark 10/11/2011, 3:02 PM Co-Sign MD

## 2011-10-15 DIAGNOSIS — R42 Dizziness and giddiness: Secondary | ICD-10-CM | POA: Diagnosis not present

## 2011-10-15 DIAGNOSIS — H9319 Tinnitus, unspecified ear: Secondary | ICD-10-CM | POA: Diagnosis not present

## 2011-10-15 DIAGNOSIS — H903 Sensorineural hearing loss, bilateral: Secondary | ICD-10-CM | POA: Diagnosis not present

## 2011-10-15 NOTE — Discharge Summary (Signed)
Scott [name] C. Whittney Steenson, MD, FACC [facility] Pager:  [phone]  

## 2011-10-17 ENCOUNTER — Encounter: Payer: Self-pay | Admitting: *Deleted

## 2011-10-28 ENCOUNTER — Encounter: Payer: Self-pay | Admitting: Cardiology

## 2011-10-28 ENCOUNTER — Ambulatory Visit (INDEPENDENT_AMBULATORY_CARE_PROVIDER_SITE_OTHER): Payer: Medicare Other | Admitting: Cardiology

## 2011-10-28 VITALS — BP 130/78 | HR 64 | Ht 73.0 in | Wt 186.0 lb

## 2011-10-28 DIAGNOSIS — N189 Chronic kidney disease, unspecified: Secondary | ICD-10-CM | POA: Diagnosis not present

## 2011-10-28 DIAGNOSIS — I2510 Atherosclerotic heart disease of native coronary artery without angina pectoris: Secondary | ICD-10-CM | POA: Diagnosis not present

## 2011-10-28 DIAGNOSIS — I498 Other specified cardiac arrhythmias: Secondary | ICD-10-CM | POA: Diagnosis not present

## 2011-10-28 DIAGNOSIS — I10 Essential (primary) hypertension: Secondary | ICD-10-CM

## 2011-10-28 DIAGNOSIS — R51 Headache: Secondary | ICD-10-CM

## 2011-10-28 DIAGNOSIS — E119 Type 2 diabetes mellitus without complications: Secondary | ICD-10-CM

## 2011-10-28 DIAGNOSIS — R001 Bradycardia, unspecified: Secondary | ICD-10-CM

## 2011-10-28 NOTE — Assessment & Plan Note (Signed)
Myoview and echo reviewed with patient. Continue secondary preventative therapy. I'll see him back in 2 years.

## 2011-10-28 NOTE — Progress Notes (Signed)
HPI [name] returns for close followup after hospital discharge for chest pain rule out MI. Echocardiogram showed EF of 55-60% with mild biatrial enlargement. There was no significant valvular heart disease. He was also no paracardial effusion. Stress Myoview showed EF of 60% with no ischemia.  Since discharge she's had no further chest discomfort. He has been set up to followup with Dr.J. Reesa Chew at [facility]. He has not been as compliant patient. Had not seen him in quite some time prior to this admission.  He says his blood pressure bottoms out and he gets dizzy when he takes a full dose of carvedilol. He's actually taking 12.5 mg twice a day. I've checked his Journal today and his blood pressures are actually under good control.  Past Medical History  Diagnosis Date  . Myocardial infarct 1994    PTCA  . Diabetes mellitus   . Hypertension   . CKD (chronic kidney disease)   . Thyroid disease   . S/P PTCA (percutaneous transluminal coronary angioplasty) 1995    Dx-OM2 proximal concentric stenosis  . CAD (coronary artery disease) 10/10/2011  . Anemia 10/11/2011  . Bradycardia 10/10/2011  . Hypothyroidism 10/10/2011  . Thrombocytopenia 10/11/2011    Current Outpatient Prescriptions  Medication Sig Dispense Refill  . aspirin 325 MG tablet Take 325 mg by mouth daily.      . carvedilol (COREG) 25 MG tablet Take 25 mg by mouth 2 (two) times daily with a meal.      . furosemide (LASIX) 40 MG tablet Take 20 mg by mouth daily as needed. For fluid retention      . HUMALOG MIX 50/50 KWIKPEN (50-50) 100 UNIT/ML SUSP 28 units in the am and 20 units at night      . levothyroxine (SYNTHROID, LEVOTHROID) 50 MCG tablet Take 50 mcg by mouth daily.      . niacin (NIASPAN) 1000 MG CR tablet Take 1,000 mg by mouth at bedtime.      . nitroGLYCERIN (NITROSTAT) 0.4 MG SL tablet Place 1 tablet (0.4 mg total) under the tongue every 5 (five) minutes as needed for chest pain.  25 tablet  3  . rosuvastatin  (CRESTOR) 20 MG tablet Take 20 mg by mouth daily.      Marland Kitchen telmisartan (MICARDIS) 80 MG tablet Take 80 mg by mouth daily.      Marland Kitchen zolpidem (AMBIEN) 10 MG tablet Take 1 tablet by mouth at bedtime.        No Known Allergies  Family History  Problem Relation Age of Onset  . Coronary artery disease Son   . Diabetes Sister   . Stomach cancer Sister     History   Social History  . Marital Status: Married    Spouse Name: N/A    Number of Children: N/A  . Years of Education: N/A   Occupational History  . Not on file.   Social History Main Topics  . Smoking status: Never Smoker   . Smokeless tobacco: Not on file  . Alcohol Use: No  . Drug Use: No  . Sexually Active:    Other Topics Concern  . Not on file   Social History Narrative  . No narrative on file    ROS ALL NEGATIVE EXCEPT THOSE NOTED IN HPI  PE  General Appearance: well developed, well nourished in no acute distress HEENT: symmetrical face, PERRLA, good dentition  Neck: no JVD, thyromegaly, or adenopathy, trachea midline Chest: symmetric without deformity Cardiac: PMI non-displaced, RRR,  normal S1, S2, no gallop or murmur Lung: clear to ausculation and percussion Vascular: all pulses full without bruits  Abdominal: nondistended, nontender, good bowel sounds, no HSM, no bruits Extremities: no cyanosis, clubbing or edema, no sign of DVT, no varicosities  Skin: normal color, no rashes Neuro: alert and oriented x 3, non-focal Pysch: normal affect  EKG  BMET    Component Value Date/Time   NA 147* 10/11/2011 0650   K 4.4 10/11/2011 0650   CL 113* 10/11/2011 0650   CO2 22 10/11/2011 0650   GLUCOSE 130* 10/11/2011 0650   BUN 29* 10/11/2011 0650   CREATININE 2.33* 10/11/2011 0650   CALCIUM 9.6 10/11/2011 0650   GFRNONAA 26* 10/11/2011 0650   GFRAA 31* 10/11/2011 0650    Lipid Panel     Component Value Date/Time   CHOL 116 10/10/2011 0846   TRIG 49 10/10/2011 0846   HDL 54 10/10/2011 0846   CHOLHDL 2.1 10/10/2011 0846   VLDL 10  10/10/2011 0846   LDLCALC 52 10/10/2011 0846    CBC    Component Value Date/Time   WBC 4.4 10/11/2011 0650   RBC 3.75* 10/11/2011 0650   HGB 11.4* 10/11/2011 0650   HCT 34.0* 10/11/2011 0650   PLT 92* 10/11/2011 0650   MCV 90.7 10/11/2011 0650   MCH 30.4 10/11/2011 0650   MCHC 33.5 10/11/2011 0650   RDW 13.4 10/11/2011 0650

## 2011-10-28 NOTE — Assessment & Plan Note (Signed)
Under good control. Continue with 12.5 mg of carvedilol twice a day. Followup with primary care.

## 2011-10-28 NOTE — Patient Instructions (Addendum)
Your physician recommends that you continue on your current medications as directed. Please refer to the Current Medication list given to you today.  Your physician wants you to follow-up in: 2 years  You will receive a reminder letter in the mail two months in advance. If you don't receive a letter, please call our office to schedule the follow-up appointment.  

## 2011-12-03 ENCOUNTER — Encounter: Payer: Self-pay | Admitting: Family Medicine

## 2011-12-03 ENCOUNTER — Ambulatory Visit (INDEPENDENT_AMBULATORY_CARE_PROVIDER_SITE_OTHER): Payer: Medicare Other | Admitting: Family Medicine

## 2011-12-03 VITALS — BP 136/82 | HR 72 | Temp 98.2°F | Ht 73.0 in | Wt 189.0 lb

## 2011-12-03 DIAGNOSIS — E1121 Type 2 diabetes mellitus with diabetic nephropathy: Secondary | ICD-10-CM | POA: Insufficient documentation

## 2011-12-03 DIAGNOSIS — E1122 Type 2 diabetes mellitus with diabetic chronic kidney disease: Secondary | ICD-10-CM | POA: Insufficient documentation

## 2011-12-03 DIAGNOSIS — N058 Unspecified nephritic syndrome with other morphologic changes: Secondary | ICD-10-CM

## 2011-12-03 DIAGNOSIS — E785 Hyperlipidemia, unspecified: Secondary | ICD-10-CM

## 2011-12-03 DIAGNOSIS — E1129 Type 2 diabetes mellitus with other diabetic kidney complication: Secondary | ICD-10-CM

## 2011-12-03 DIAGNOSIS — I2510 Atherosclerotic heart disease of native coronary artery without angina pectoris: Secondary | ICD-10-CM

## 2011-12-03 DIAGNOSIS — D649 Anemia, unspecified: Secondary | ICD-10-CM | POA: Diagnosis not present

## 2011-12-03 DIAGNOSIS — E1165 Type 2 diabetes mellitus with hyperglycemia: Secondary | ICD-10-CM

## 2011-12-03 DIAGNOSIS — Z23 Encounter for immunization: Secondary | ICD-10-CM | POA: Diagnosis not present

## 2011-12-03 DIAGNOSIS — N183 Chronic kidney disease, stage 3 (moderate): Secondary | ICD-10-CM

## 2011-12-03 DIAGNOSIS — E039 Hypothyroidism, unspecified: Secondary | ICD-10-CM

## 2011-12-03 DIAGNOSIS — E1169 Type 2 diabetes mellitus with other specified complication: Secondary | ICD-10-CM | POA: Insufficient documentation

## 2011-12-03 DIAGNOSIS — I10 Essential (primary) hypertension: Secondary | ICD-10-CM

## 2011-12-03 MED ORDER — INSULIN LISPRO PROT & LISPRO (50-50 MIX) 100 UNIT/ML ~~LOC~~ SUSP: SUBCUTANEOUS | Status: DC

## 2011-12-03 MED ORDER — TELMISARTAN 80 MG PO TABS: 80.0000 mg | ORAL_TABLET | Freq: Every day | ORAL | Status: DC

## 2011-12-03 MED ORDER — NIACIN ER (ANTIHYPERLIPIDEMIC) 1000 MG PO TBCR: 1000.0000 mg | EXTENDED_RELEASE_TABLET | Freq: Every day | ORAL | Status: DC

## 2011-12-03 MED ORDER — FUROSEMIDE 40 MG PO TABS: 20.0000 mg | ORAL_TABLET | Freq: Every day | ORAL | Status: DC | PRN

## 2011-12-03 MED ORDER — ROSUVASTATIN CALCIUM 20 MG PO TABS: 20.0000 mg | ORAL_TABLET | Freq: Every day | ORAL | Status: DC

## 2011-12-03 MED ORDER — ZOLPIDEM TARTRATE 10 MG PO TABS: 5.0000 mg | ORAL_TABLET | Freq: Every day | ORAL | Status: DC

## 2011-12-03 MED ORDER — LEVOTHYROXINE SODIUM 50 MCG PO TABS: 50.0000 ug | ORAL_TABLET | Freq: Every day | ORAL | Status: DC

## 2011-12-03 MED ORDER — CARVEDILOL 25 MG PO TABS: 25.0000 mg | ORAL_TABLET | Freq: Two times a day (BID) | ORAL | Status: DC

## 2011-12-03 NOTE — Patient Instructions (Addendum)
You need to work on better diet.  Increase water.  Increase amount of vegetables in diet.  Fresh or frozen vegetables are better than canned. Medicines sent to pharmacy today. Return in 1-2 months for follow up on sugars and blood pressure. Good to meet you today, call us with questions. Flu shot today. Call your insurance about the shingles shot to see if it is covered or how much it would cost and where is cheaper (here or pharmacy).  If you want to receive here, call for nurse visit.

## 2011-12-03 NOTE — Assessment & Plan Note (Signed)
Continue crestor.  Samples provided today.  Reviewed records

## 2011-12-03 NOTE — Assessment & Plan Note (Signed)
Sent in generic levothyroxine, discussed need to be checked if change in size of pills. Will need to check TSH next visit.

## 2011-12-03 NOTE — Assessment & Plan Note (Signed)
Recently seen by cards, rec f/u 2 yrs. Denies anginal sxs. Recent hospitalization for chest pain, r/o.

## 2011-12-03 NOTE — Assessment & Plan Note (Signed)
Chronic, stable.  Continue to monitor. Baseline CR seems to be 1.7-1.9.

## 2011-12-03 NOTE — Progress Notes (Signed)
Subjective:    Patient ID: Scott [name], male    DOB: [birth year], 71 y.o.   MRN: [id number]  HPI CC: new medicare pt to establish  Mr. Baratta is a very pleasant 71 yo with h/o CAD/MI, T2DM with nephropathy, CKD stage 3 (sees Dr. Briant Cedar), HLD, and hypothyroidism who presents today to establish care.  Recent hospitalization 2 mo ago.  T2DM - brings log of random sugars - ranging from 60-220s.  Some low sugars and hypoglycemic sxs.  Keeps OJ in fridge.  Next eye exam 01/2012.  Checks sugars several times in day.  Endorses paresthesias in bilat feet. Lab Results  Component Value Date   HGBA1C 8.2* 10/10/2011  prior A1c 7.5% 09/2011.  HTN - takes metoprolol 25mg  bid, micardis 80mg  daily, sometimes takes 1/2 pill, sometimes takes whole pill.  Also takes lasix 20mg  prn leg swelling.  Brings log showing BP ranging 110-160s/50-70s.  HLD - compliant with crestor and niaspan.  No myalgias.  Hypothyroid - on synthroid.  Has never tried generic - prior PCP did not want switch.  CRI - sees renal Briant Cedar) 2x/year.  Denies falls in last year.   Denies depression/sadness.  Enjoys hobbies. Walks dog.  Preventative: Last CPE 02/2011. UTD pneumovax.  Flu shot today. Doesn't think UTD tetanus.  Has never had shingles shot. Colonoscopy 2010, rec rpt 5 yrs given personal hx colon polyps.  +diverticulosis.  Medications and allergies reviewed and updated in chart.  Past histories reviewed and updated if relevant as below. Patient Active Problem List  Diagnosis  . CAD (coronary artery disease)  . Hypertension  . Hypothyroidism  . Dizziness  . Headache  . Anemia  . Thrombocytopenia  . HLD (hyperlipidemia)  . CKD (chronic kidney disease) stage 3, GFR 30-59 ml/min  . Type 2 diabetes with nephropathy   Past Medical History  Diagnosis Date  . Type 2 diabetes with nephropathy   . Hypertension   . CKD (chronic kidney disease) stage 3, GFR 30-59 ml/min   . CAD (coronary artery disease)  10/10/2011    MI s/p PTCA (Dx-OM2 proximal concentric stenosis)  . Anemia 10/11/2011  . Bradycardia 10/10/2011  . Hypothyroidism 10/10/2011  . Thrombocytopenia 10/11/2011  . HLD (hyperlipidemia)   . Arthritis   . Generalized headaches     frequent  . Glaucoma     s/p laser surgery  . Colon polyps   . Acquired hand deformity 1962    hand saw accident at work   Past Surgical History  Procedure Date  . Back surgery     cervical neck  . Coronary angioplasty 1994, 1995    PTCA  . Colonoscopy 11/2008    1 polyp, diverticulosis, rec rpt 5 yrs (Dr. Randa Evens, [facility])  . Eye surgery 2012    laser surgery for glaucoma   History  Substance Use Topics  . Smoking status: Former Smoker    Quit date: 03/04/1978  . Smokeless tobacco: Never Used  . Alcohol Use: No   Family History  Problem Relation Age of Onset  . Coronary artery disease Son 62    5v CABG and stents  . Diabetes Sister   . Cancer Sister 63    stomach  . Hyperlipidemia Sister   . Stroke Neg Hx    No Known Allergies Current Outpatient Prescriptions on File Prior to Visit  Medication Sig Dispense Refill  . aspirin 325 MG tablet Take 325 mg by mouth daily.      . nitroGLYCERIN (NITROSTAT) 0.4 MG  SL tablet Place 1 tablet (0.4 mg total) under the tongue every 5 (five) minutes as needed for chest pain.  25 tablet  3  . DISCONTD: carvedilol (COREG) 25 MG tablet Take 25 mg by mouth 2 (two) times daily with a meal.      . DISCONTD: furosemide (LASIX) 40 MG tablet Take 20 mg by mouth daily as needed. For fluid retention      . DISCONTD: HUMALOG MIX 50/50 KWIKPEN (50-50) 100 UNIT/ML SUSP 28 units in the am and 20 units at night      . DISCONTD: levothyroxine (SYNTHROID, LEVOTHROID) 50 MCG tablet Take 50 mcg by mouth daily.      Marland Kitchen DISCONTD: niacin (NIASPAN) 1000 MG CR tablet Take 1,000 mg by mouth at bedtime.      Marland Kitchen DISCONTD: rosuvastatin (CRESTOR) 20 MG tablet Take 20 mg by mouth at bedtime.       Marland Kitchen DISCONTD: telmisartan (MICARDIS) 80 MG  tablet Take 80 mg by mouth daily.      Marland Kitchen DISCONTD: zolpidem (AMBIEN) 10 MG tablet Take 5 mg by mouth at bedtime.          Review of Systems  Constitutional: Negative for fever, chills, activity change, appetite change, fatigue and unexpected weight change.  HENT: Negative for hearing loss and neck pain.   Eyes: Positive for visual disturbance.  Respiratory: Negative for cough, chest tightness, shortness of breath and wheezing.   Cardiovascular: Positive for leg swelling. Negative for chest pain and palpitations.  Gastrointestinal: Negative for nausea, vomiting, abdominal pain, diarrhea, constipation, blood in stool and abdominal distention.  Genitourinary: Negative for hematuria and difficulty urinating.  Musculoskeletal: Negative for myalgias and arthralgias.  Skin: Negative for rash.  Neurological: Negative for dizziness, seizures, syncope and headaches.  Hematological: Does not bruise/bleed easily.  Psychiatric/Behavioral: Negative for dysphoric mood. The patient is not nervous/anxious.        Objective:   Physical Exam  Nursing note and vitals reviewed. Constitutional: He is oriented to person, place, and time. He appears well-developed and well-nourished. No distress.  HENT:  Head: Normocephalic and atraumatic.  Right Ear: Hearing, tympanic membrane, external ear and ear canal normal.  Left Ear: Hearing, tympanic membrane, external ear and ear canal normal.  Nose: Nose normal.  Mouth/Throat: Oropharynx is clear and moist. No oropharyngeal exudate.  Eyes: Conjunctivae normal and EOM are normal. Pupils are equal, round, and reactive to light. No scleral icterus.  Neck: Normal range of motion. Neck supple.  Cardiovascular: Normal rate, regular rhythm, normal heart sounds and intact distal pulses.   No murmur heard. Pulses:      Radial pulses are 2+ on the right side, and 2+ on the left side.  Pulmonary/Chest: Effort normal and breath sounds normal. No respiratory distress. He  has no wheezes. He has no rales.  Abdominal: Soft. Bowel sounds are normal. He exhibits no distension and no mass. There is no tenderness. There is no rebound and no guarding.  Musculoskeletal: Normal range of motion. He exhibits no edema.       Diabetic foot exam: Normal inspection No skin breakdown Early callus formation L>R Normal DP/PT pulses Normal sensation to light touch and monofilament Nails normal  Lymphadenopathy:    He has no cervical adenopathy.  Neurological: He is alert and oriented to person, place, and time.       CN grossly intact, station and gait intact  Skin: Skin is warm and dry. No rash noted.  Psychiatric: He has a normal  mood and affect. His behavior is normal. Judgment and thought content normal.       Assessment & Plan:

## 2011-12-03 NOTE — Assessment & Plan Note (Signed)
Check CBC next blood draw. 

## 2011-12-03 NOTE — Assessment & Plan Note (Signed)
BP Readings from Last 3 Encounters:  12/03/11 136/82  10/28/11 130/78  10/11/11 130/65   chronic, stable. Continue meds. On brand micardis, on lasix prn, and coreg 25mg  bid.

## 2011-12-04 NOTE — Assessment & Plan Note (Signed)
Continue current med of humulog 50/50 28u am and 20u pm - sent in refills.   May need switch to 70/30 as current insulin may no longer be covered by insurance. Has not been adherent to diabetic diet - encouraged stricter compliance with this.

## 2011-12-12 ENCOUNTER — Encounter: Payer: Self-pay | Admitting: Family Medicine

## 2012-01-28 DIAGNOSIS — H4011X0 Primary open-angle glaucoma, stage unspecified: Secondary | ICD-10-CM | POA: Diagnosis not present

## 2012-01-28 DIAGNOSIS — H409 Unspecified glaucoma: Secondary | ICD-10-CM | POA: Diagnosis not present

## 2012-01-28 DIAGNOSIS — H2510 Age-related nuclear cataract, unspecified eye: Secondary | ICD-10-CM | POA: Diagnosis not present

## 2012-02-03 ENCOUNTER — Encounter: Payer: Self-pay | Admitting: Family Medicine

## 2012-02-03 ENCOUNTER — Ambulatory Visit (INDEPENDENT_AMBULATORY_CARE_PROVIDER_SITE_OTHER): Payer: Medicare Other | Admitting: Family Medicine

## 2012-02-03 VITALS — BP 142/90 | HR 62 | Temp 97.9°F | Wt 186.5 lb

## 2012-02-03 DIAGNOSIS — E1129 Type 2 diabetes mellitus with other diabetic kidney complication: Secondary | ICD-10-CM

## 2012-02-03 DIAGNOSIS — E119 Type 2 diabetes mellitus without complications: Secondary | ICD-10-CM

## 2012-02-03 DIAGNOSIS — I10 Essential (primary) hypertension: Secondary | ICD-10-CM | POA: Diagnosis not present

## 2012-02-03 DIAGNOSIS — I2510 Atherosclerotic heart disease of native coronary artery without angina pectoris: Secondary | ICD-10-CM | POA: Diagnosis not present

## 2012-02-03 DIAGNOSIS — E1121 Type 2 diabetes mellitus with diabetic nephropathy: Secondary | ICD-10-CM

## 2012-02-03 DIAGNOSIS — N058 Unspecified nephritic syndrome with other morphologic changes: Secondary | ICD-10-CM

## 2012-02-03 DIAGNOSIS — E785 Hyperlipidemia, unspecified: Secondary | ICD-10-CM

## 2012-02-03 DIAGNOSIS — N183 Chronic kidney disease, stage 3 unspecified: Secondary | ICD-10-CM

## 2012-02-03 MED ORDER — TELMISARTAN 80 MG PO TABS: 80.0000 mg | ORAL_TABLET | Freq: Every day | ORAL | Status: DC

## 2012-02-03 MED ORDER — ROSUVASTATIN CALCIUM 20 MG PO TABS: 20.0000 mg | ORAL_TABLET | Freq: Every day | ORAL | Status: DC

## 2012-02-03 MED ORDER — INSULIN LISPRO PROT & LISPRO (50-50 MIX) 100 UNIT/ML ~~LOC~~ SUSP: SUBCUTANEOUS | Status: DC

## 2012-02-03 MED ORDER — LEVOTHYROXINE SODIUM 50 MCG PO TABS: 50.0000 ug | ORAL_TABLET | Freq: Every day | ORAL | Status: DC

## 2012-02-03 MED ORDER — LISINOPRIL 20 MG PO TABS: 20.0000 mg | ORAL_TABLET | Freq: Two times a day (BID) | ORAL | Status: DC

## 2012-02-03 MED ORDER — CARVEDILOL 25 MG PO TABS: 25.0000 mg | ORAL_TABLET | Freq: Two times a day (BID) | ORAL | Status: DC

## 2012-02-03 MED ORDER — NIACIN ER 500 MG PO CPCR: 1000.0000 mg | ORAL_CAPSULE | Freq: Every day | ORAL | Status: DC

## 2012-02-03 MED ORDER — ZOLPIDEM TARTRATE 10 MG PO TABS: 5.0000 mg | ORAL_TABLET | Freq: Every day | ORAL | Status: DC

## 2012-02-03 NOTE — Progress Notes (Addendum)
  Subjective:    Patient ID: Scott [name], male    DOB: 1940/05/26, 71 y.o.   MRN: [id number]  HPI CC: f/u DM  Mr. Ursua is a very pleasant 71 yo with h/o CAD/MI, T2DM with nephropathy, CKD stage 3 (sees Dr. Briant Cedar), HLD, and hypothyroidism who presents for 3 mo f/u.  Prior saw Dr. Juleen China.  Worried about insurance changes - worried about affording medications.  Will do trial of   DM - needs insulin changed as insurance won't cover 50/50.  Brings log of sugars - some low PM sugars to 58-60s, am sugars always high.eye exam on tuesday  HTN - No HA, vision changes, CP/tightness, SOB, leg swelling.  Brings log of sugars showing 105-160/60-80s overall good control, HR 40-60s, mostly 50s.  Denies dizziness.  CRI - last check here was stage 4.    Thinks has had pneumonia shot - will bring me copy of paperwork he received from prior PCP.  Past Medical History  Diagnosis Date  . Type 2 diabetes with nephropathy   . Hypertension   . CKD (chronic kidney disease) stage 3, GFR 30-59 ml/min   . CAD (coronary artery disease) 10/10/2011    MI s/p PTCA (Dx-OM2 proximal concentric stenosis)  . Anemia 10/11/2011  . Bradycardia 10/10/2011  . Hypothyroidism 10/10/2011  . Thrombocytopenia 10/11/2011  . HLD (hyperlipidemia)   . Arthritis   . Generalized headaches     frequent  . Glaucoma     s/p laser surgery  . Colon polyps   . Acquired hand deformity 1962    hand saw accident at work    Review of Systems Per HPI    Objective:   Physical Exam  Nursing note and vitals reviewed. Constitutional: He appears well-developed and well-nourished. No distress.  HENT:  Head: Normocephalic and atraumatic.  Mouth/Throat: Oropharynx is clear and moist. No oropharyngeal exudate.  Neck: Normal range of motion. Neck supple.  Cardiovascular: Normal rate, regular rhythm, normal heart sounds and intact distal pulses.   No murmur heard. Pulmonary/Chest: Effort normal and breath sounds normal. No respiratory  distress. He has no wheezes. He has no rales.  Musculoskeletal: He exhibits no edema.  Skin: Skin is warm and dry. No rash noted.  Psychiatric: He has a normal mood and affect.       Assessment & Plan:

## 2012-02-03 NOTE — Assessment & Plan Note (Signed)
BP Readings from Last 3 Encounters:  02/03/12 142/90  12/03/11 136/82  10/28/11 130/78  a bit high today.  Chronic. Continue meds. Change micardis to lisinopril given pt cost concern - if Dr. Briant Cedar agrees.

## 2012-02-03 NOTE — Assessment & Plan Note (Signed)
Continue ASA, crestor, coreg, and ACEI or ARB.

## 2012-02-03 NOTE — Assessment & Plan Note (Signed)
Continue crestor.  Samples provided today. Great control as of FLP 10/2011. Change niaspan to niacin OTC. Lab Results  Component Value Date   CHOL 116 10/10/2011   HDL 54 10/10/2011   LDLCALC 52 10/10/2011   LDLDIRECT 84 09/04/2011   TRIG 49 10/10/2011   CHOLHDL 2.1 10/10/2011

## 2012-02-03 NOTE — Patient Instructions (Addendum)
Plan to transition from micardis 80mg  to lisinopril 20mg  once daily - if blood pressure consistently >140/90 after 1 week, may go up to lisinopril 20mg  twice daily.  --> check with Dr. Briant Cedar first.  I've printed out prescription for Micardis for 1 month while you see and ask Dr. Briant Cedar. Change from prescription niaspan CR to over the counter niacin 500mg  2 pills nightly. Return to see me in 1 month to see how we're doing with these changes. We may change insulin from 50/50 to 70/30 novolog next visit. Decrease morning insulin to 24 units and increase dinner insulin to 22 units Bring me copy of recent paperwork for immunizations.

## 2012-02-03 NOTE — Assessment & Plan Note (Signed)
PM sugars consistently low, AM sugars high.  Will titrate 50/50 accordingly. rtc 1 mo for f/u, consider switch to 70/30 insulin as likely cheaper.

## 2012-02-03 NOTE — Assessment & Plan Note (Signed)
Follows with renal.  Check renal panel today.

## 2012-02-04 ENCOUNTER — Telehealth: Payer: Self-pay

## 2012-02-04 LAB — RENAL FUNCTION PANEL
BUN: 35 mg/dL — ABNORMAL HIGH (ref 6–23)
Creatinine, Ser: 1.8 mg/dL — ABNORMAL HIGH (ref 0.4–1.5)
GFR: 38.9 mL/min — ABNORMAL LOW (ref >60.00)
Glucose, Bld: 172 mg/dL — ABNORMAL HIGH (ref 70–99)
Phosphorus: 3.4 mg/dL (ref 2.3–4.6)

## 2012-02-04 LAB — MICROALBUMIN / CREATININE URINE RATIO: Microalb Creat Ratio: 15.6 mg/g (ref 0.0–30.0)

## 2012-02-04 LAB — HEMOGLOBIN A1C: Hgb A1c MFr Bld: 8.2 % — ABNORMAL HIGH (ref 4.6–6.5)

## 2012-02-04 MED ORDER — INSULIN LISPRO PROT & LISPRO (50-50 MIX) 100 UNIT/ML ~~LOC~~ SUSP: SUBCUTANEOUS | Status: DC

## 2012-02-04 NOTE — Telephone Encounter (Signed)
Patient notified as instructed by telephone. 

## 2012-02-04 NOTE — Telephone Encounter (Signed)
Please notify sent in pen form of humalog 50/50.

## 2012-02-04 NOTE — Telephone Encounter (Signed)
Pt saw Dr Reece Agar on 02/03/12; pt went to pick up insulin at [facility] and it was in vial form rather than pen. Pt did not pick up insulin; pt thought Dr Reece Agar had already switched pt to new insulin due to insurance changes that do not go into effect until Jan.  Pt does not want to switch to new insulin until Jan and request one more month of Humalog 50/50 in pen form sent to [address]. pts BS last night at 9 pm was 300. Pt out of insulin; pt has not taken insulin since yesterday morning.Please advise.

## 2012-02-06 ENCOUNTER — Encounter: Payer: Self-pay | Admitting: *Deleted

## 2012-03-06 ENCOUNTER — Ambulatory Visit (INDEPENDENT_AMBULATORY_CARE_PROVIDER_SITE_OTHER): Payer: Medicare Other | Admitting: Family Medicine

## 2012-03-06 ENCOUNTER — Encounter: Payer: Self-pay | Admitting: Family Medicine

## 2012-03-06 VITALS — BP 164/90 | HR 56 | Wt 186.0 lb

## 2012-03-06 DIAGNOSIS — E1129 Type 2 diabetes mellitus with other diabetic kidney complication: Secondary | ICD-10-CM

## 2012-03-06 DIAGNOSIS — I10 Essential (primary) hypertension: Secondary | ICD-10-CM

## 2012-03-06 DIAGNOSIS — E1121 Type 2 diabetes mellitus with diabetic nephropathy: Secondary | ICD-10-CM

## 2012-03-06 DIAGNOSIS — N058 Unspecified nephritic syndrome with other morphologic changes: Secondary | ICD-10-CM | POA: Diagnosis not present

## 2012-03-06 MED ORDER — INSULIN ASPART PROT & ASPART (70-30 MIX) 100 UNIT/ML ~~LOC~~ SUSP: 20.0000 [IU] | Freq: Two times a day (BID) | SUBCUTANEOUS | Status: DC

## 2012-03-06 NOTE — Patient Instructions (Signed)
Let's switch you over to novolog 70/30 20units twice daily.  Start at lower dose to see how you do with new insulin.  Call me with any questions or concerns. Return in 1 month for follow up sugars. Good to see you today, call us with questions. No changes to your blood pressure medicines.

## 2012-03-06 NOTE — Progress Notes (Addendum)
  Subjective:    Patient ID: Scott [name], male    DOB: Feb 01, 1941, 72 y.o.   MRN: [id number]  HPI CC: 1 mo f/u DM  DM - most sugars 150-160, few low 100s.  Has had some low PM sugars after supper 10pm - down to 60-70, feels symptomatic with this.  Only happens 1-2x/mo.  Currently on 50/50 - 24u in am and 22 at night.  ADDENDUM ==> pt brings in log of sugars: am sugars 150-200, PM sugars high 100s to 200s.  Lows of 69 and 77 at 10pm.  HTN - No HA, vision changes, CP/tightness, SOB, leg swelling.  Compliant with coreg, lasix, lisinopril. Brings log of BP - ranging 90-150/60s  Sees Dr. Briant Cedar next week.  ok'd switch from micardis to lisinopril 20mg  bid.  Lab Results  Component Value Date   CREATININE 1.8* 02/03/2012    Lab Results  Component Value Date   HGBA1C 8.2* 02/03/2012    Review of Systems Per HPI    Objective:   Physical Exam  Nursing note and vitals reviewed. Constitutional: He appears well-developed and well-nourished. No distress.  HENT:  Mouth/Throat: Oropharynx is clear and moist. No oropharyngeal exudate.  Eyes: Conjunctivae normal and EOM are normal. Pupils are equal, round, and reactive to light.  Cardiovascular: Normal rate, regular rhythm, normal heart sounds and intact distal pulses.   No murmur heard. Pulmonary/Chest: Effort normal and breath sounds normal. No respiratory distress. He has no wheezes. He has no rales.  Musculoskeletal: He exhibits edema (tr pedal edema).  Psychiatric: He has a normal mood and affect.       Assessment & Plan:

## 2012-03-06 NOTE — Assessment & Plan Note (Signed)
Chronic. Having trouble with lows after dinner.  Will switch humalog 50/50 to novalog 70/30 in hopes of 1. Improvement of this problem and 2. Hopeful for cheaper insulin.  Start at lower overall dose of 20u bid (prior was on 24u in am and 22 u in pm). rtc 1 mo for f/u.

## 2012-03-06 NOTE — Assessment & Plan Note (Signed)
Home log very adequate.  No changes today.

## 2012-03-11 ENCOUNTER — Telehealth: Payer: Self-pay | Admitting: Family Medicine

## 2012-03-11 NOTE — Telephone Encounter (Signed)
Received log from pt - Sugars - fasting 180-200s, pm 80-200s BP - 100-140/50-60s  plz phone pt - at what times is he taking 70/30?   Recommend try to schedule meals at same time each day and schedule insulin at same time each day - prior to breakfast and prior to dinner. Recommend increase am 70/30 to 24 units and bring me log next week.  Continue pm 70/30 at 22units.  Blood pressure doing well

## 2012-03-12 NOTE — Telephone Encounter (Signed)
Spoke with patient. He said he takes his insulin normally at 9 am and 6 pm and that it may vary 30 minutes either way sometimes. Advised to try to eat and take insulin at the same time daily and to increase AM dose to 24 units and to continue 22 units in the PM. He verbalized understanding and will bring another sugar log next week.

## 2012-03-20 ENCOUNTER — Telehealth: Payer: Self-pay | Admitting: *Deleted

## 2012-03-20 DIAGNOSIS — E119 Type 2 diabetes mellitus without complications: Secondary | ICD-10-CM | POA: Diagnosis not present

## 2012-03-20 DIAGNOSIS — I10 Essential (primary) hypertension: Secondary | ICD-10-CM | POA: Diagnosis not present

## 2012-03-20 DIAGNOSIS — N2581 Secondary hyperparathyroidism of renal origin: Secondary | ICD-10-CM | POA: Diagnosis not present

## 2012-03-20 DIAGNOSIS — I129 Hypertensive chronic kidney disease with stage 1 through stage 4 chronic kidney disease, or unspecified chronic kidney disease: Secondary | ICD-10-CM | POA: Diagnosis not present

## 2012-03-20 NOTE — Telephone Encounter (Signed)
Patient dropped off sugar and BP log. In your IN box.

## 2012-03-22 NOTE — Telephone Encounter (Signed)
Reviewed log: BP: 100-140/60-70s, one isolated to 171/68.  HR 50-80s. Sugars: fasting 110-160, pm 69-190 Recently increased am insulin to 24 and pm insulin at 22  Sugars are improved - no changes until next appointment.  Monitor and notify me if having low sugars.

## 2012-03-23 NOTE — Telephone Encounter (Signed)
Message left notifying patient.

## 2012-04-06 ENCOUNTER — Encounter: Payer: Self-pay | Admitting: Family Medicine

## 2012-04-06 ENCOUNTER — Ambulatory Visit (INDEPENDENT_AMBULATORY_CARE_PROVIDER_SITE_OTHER): Payer: Medicare Other | Admitting: Family Medicine

## 2012-04-06 VITALS — BP 124/82 | HR 60 | Temp 98.2°F | Wt 186.8 lb

## 2012-04-06 DIAGNOSIS — N058 Unspecified nephritic syndrome with other morphologic changes: Secondary | ICD-10-CM

## 2012-04-06 DIAGNOSIS — E1129 Type 2 diabetes mellitus with other diabetic kidney complication: Secondary | ICD-10-CM | POA: Diagnosis not present

## 2012-04-06 DIAGNOSIS — I10 Essential (primary) hypertension: Secondary | ICD-10-CM

## 2012-04-06 DIAGNOSIS — E039 Hypothyroidism, unspecified: Secondary | ICD-10-CM | POA: Diagnosis not present

## 2012-04-06 DIAGNOSIS — E1121 Type 2 diabetes mellitus with diabetic nephropathy: Secondary | ICD-10-CM

## 2012-04-06 MED ORDER — INSULIN ASPART PROT & ASPART (70-30 MIX) 100 UNIT/ML ~~LOC~~ SUSP: 20.0000 [IU] | Freq: Two times a day (BID) | SUBCUTANEOUS | Status: DC

## 2012-04-06 MED ORDER — ROSUVASTATIN CALCIUM 20 MG PO TABS: 20.0000 mg | ORAL_TABLET | Freq: Every day | ORAL | Status: DC

## 2012-04-06 MED ORDER — INSULIN ASPART PROT & ASPART (70-30 MIX) 100 UNIT/ML ~~LOC~~ SUSP: 24.0000 [IU] | Freq: Two times a day (BID) | SUBCUTANEOUS | Status: DC

## 2012-04-06 NOTE — Assessment & Plan Note (Signed)
Chronic, stable Great control based on home bp #s.

## 2012-04-06 NOTE — Patient Instructions (Addendum)
Return in 2-3 months for medicare wellness visit. Blood pressure and sugars are doing well. Let's decrease carvedilol to 25mg  in am and 12.5mg  (1/2 tablet) in pm. Let's increase novolog 70/30 to 26 units in morning, continue at 24 units at night time. Diabetic shoe prescription provided today.

## 2012-04-06 NOTE — Assessment & Plan Note (Signed)
Check blood work next visit. Discussed goal BP with [facility].

## 2012-04-06 NOTE — Assessment & Plan Note (Signed)
Check TSH next blood work.

## 2012-04-06 NOTE — Progress Notes (Signed)
  Subjective:    Patient ID: Scott [name], male    DOB: June 28, 1940, 72 y.o.   MRN: [id number]  HPI CC: DM f/u  Brings logs:  cbg fasting - 100-160 (one isolated to 259) cbg random - 100-250 (mostly 100s) BP - 86-130/50-60  HR - 40-60s  DM - novolog 70/30 24u bid with meals.  Foot exam 12/2011.  Eye exam 01/2012.  Denies low sugars or hypoglycemic sxs. Lab Results  Component Value Date   HGBA1C 8.2* 02/03/2012    HTN - taking carvedilol 25bid, lisinopril 20mg  bid, lasix 40mg  daily prn (rarely takes).  Noticing mild headache that comes on in evenings.  No dizziness.  No leg swelling, chest pain or tightness, shortness of breath.  Hypothyroid - compliant with med. Lab Results  Component Value Date   TSH 1.335 10/10/2011    CRI - sees Dr. Briant Cedar.    Past Medical History  Diagnosis Date  . Type 2 diabetes with nephropathy   . Hypertension   . CKD (chronic kidney disease) stage 3, GFR 30-59 ml/min   . CAD (coronary artery disease) 10/10/2011    MI s/p PTCA (Dx-OM2 proximal concentric stenosis)  . Anemia 10/11/2011  . Bradycardia 10/10/2011  . Hypothyroidism 10/10/2011  . Thrombocytopenia 10/11/2011  . HLD (hyperlipidemia)   . Arthritis   . Generalized headaches     frequent  . Glaucoma     s/p laser surgery  . Colon polyps   . Acquired hand deformity 1962    hand saw accident at work     Review of Systems Per HPI    Objective:   Physical Exam  Nursing note and vitals reviewed. Constitutional: He appears well-developed and well-nourished. No distress.  HENT:  Head: Normocephalic and atraumatic.  Mouth/Throat: Oropharynx is clear and moist. No oropharyngeal exudate.  Eyes: Conjunctivae normal and EOM are normal. Pupils are equal, round, and reactive to light.  Neck: Normal range of motion. Neck supple. Carotid bruit is not present.  Cardiovascular: Normal rate, regular rhythm, normal heart sounds and intact distal pulses.   No murmur heard. Pulmonary/Chest: Effort  normal and breath sounds normal. No respiratory distress. He has no wheezes. He has no rales.  Musculoskeletal: He exhibits edema (1+ pedal edema).  Lymphadenopathy:    He has no cervical adenopathy.  Psychiatric: He has a normal mood and affect.       Assessment & Plan:

## 2012-04-06 NOTE — Assessment & Plan Note (Signed)
Chronic, good numbers. Some high PM sugars - so will increase am 70/30 to 26u. rtc 2-3 mo for f/u, check A1c at that time.

## 2012-04-14 ENCOUNTER — Telehealth: Payer: Self-pay | Admitting: *Deleted

## 2012-04-14 NOTE — Telephone Encounter (Signed)
Form in your IN box.

## 2012-04-14 NOTE — Telephone Encounter (Signed)
Pt dropped off form with prescription that Dr. Sharen Hones had given him for shoe inserts.  The [employer] is requesting additional information with office notes.  When complete to fax to [phone]. Fax.    Patient number if needed, is [phone].

## 2012-04-15 NOTE — Telephone Encounter (Signed)
Forms and office notes faxed.

## 2012-04-15 NOTE — Telephone Encounter (Signed)
Filled out and placed in Kim's box. 

## 2012-04-24 ENCOUNTER — Other Ambulatory Visit: Payer: Self-pay | Admitting: Family Medicine

## 2012-04-24 NOTE — Telephone Encounter (Signed)
plz phone in. 

## 2012-04-24 NOTE — Telephone Encounter (Signed)
Rx phoned to pharmacy.  

## 2012-04-24 NOTE — Telephone Encounter (Signed)
Ok to refill 

## 2012-05-25 ENCOUNTER — Other Ambulatory Visit: Payer: Self-pay | Admitting: Family Medicine

## 2012-05-25 NOTE — Telephone Encounter (Signed)
plz phone in. 

## 2012-05-25 NOTE — Telephone Encounter (Signed)
Ok to refill 

## 2012-05-26 NOTE — Telephone Encounter (Signed)
Rx called in as directed.   

## 2012-06-25 ENCOUNTER — Other Ambulatory Visit: Payer: Self-pay | Admitting: Family Medicine

## 2012-06-25 DIAGNOSIS — I10 Essential (primary) hypertension: Secondary | ICD-10-CM

## 2012-06-25 DIAGNOSIS — E785 Hyperlipidemia, unspecified: Secondary | ICD-10-CM

## 2012-06-25 DIAGNOSIS — E039 Hypothyroidism, unspecified: Secondary | ICD-10-CM

## 2012-06-25 DIAGNOSIS — D649 Anemia, unspecified: Secondary | ICD-10-CM

## 2012-06-25 DIAGNOSIS — E1121 Type 2 diabetes mellitus with diabetic nephropathy: Secondary | ICD-10-CM

## 2012-06-30 ENCOUNTER — Other Ambulatory Visit (INDEPENDENT_AMBULATORY_CARE_PROVIDER_SITE_OTHER): Payer: Medicare Other

## 2012-06-30 DIAGNOSIS — E039 Hypothyroidism, unspecified: Secondary | ICD-10-CM

## 2012-06-30 DIAGNOSIS — D649 Anemia, unspecified: Secondary | ICD-10-CM | POA: Diagnosis not present

## 2012-06-30 DIAGNOSIS — N058 Unspecified nephritic syndrome with other morphologic changes: Secondary | ICD-10-CM

## 2012-06-30 DIAGNOSIS — N183 Chronic kidney disease, stage 3 unspecified: Secondary | ICD-10-CM

## 2012-06-30 DIAGNOSIS — I10 Essential (primary) hypertension: Secondary | ICD-10-CM

## 2012-06-30 DIAGNOSIS — E1129 Type 2 diabetes mellitus with other diabetic kidney complication: Secondary | ICD-10-CM | POA: Diagnosis not present

## 2012-06-30 DIAGNOSIS — E785 Hyperlipidemia, unspecified: Secondary | ICD-10-CM | POA: Diagnosis not present

## 2012-06-30 DIAGNOSIS — E1121 Type 2 diabetes mellitus with diabetic nephropathy: Secondary | ICD-10-CM

## 2012-06-30 LAB — LIPID PANEL
HDL: 49.5 mg/dL (ref >39.00)
Total CHOL/HDL Ratio: 3
VLDL: 19.8 mg/dL (ref 0.0–40.0)

## 2012-06-30 LAB — RENAL FUNCTION PANEL
CO2: 25 mEq/L (ref 19–32)
Calcium: 9.5 mg/dL (ref 8.4–10.5)
Chloride: 108 mEq/L (ref 96–112)
GFR: 32.27 mL/min — ABNORMAL LOW (ref >60.00)
Potassium: 5 mEq/L (ref 3.5–5.1)
Sodium: 141 mEq/L (ref 135–145)

## 2012-06-30 LAB — CBC WITH DIFFERENTIAL/PLATELET
Basophils Relative: 0.5 % (ref 0.0–3.0)
Eosinophils Relative: 4.5 % (ref 0.0–5.0)
MCV: 88.4 fl (ref 78.0–100.0)
Monocytes Relative: 7.6 % (ref 3.0–12.0)
Neutrophils Relative %: 55.1 % (ref 43.0–77.0)
Platelets: 103 10*3/uL — ABNORMAL LOW (ref 150.0–400.0)
RBC: 4 Mil/uL — ABNORMAL LOW (ref 4.22–5.81)
WBC: 4.7 10*3/uL (ref 4.5–10.5)

## 2012-06-30 LAB — HEMOGLOBIN A1C: Hgb A1c MFr Bld: 7.6 % — ABNORMAL HIGH (ref 4.6–6.5)

## 2012-06-30 LAB — TSH: TSH: 0.79 u[IU]/mL (ref 0.35–5.50)

## 2012-07-06 ENCOUNTER — Encounter: Payer: Self-pay | Admitting: Family Medicine

## 2012-07-06 ENCOUNTER — Ambulatory Visit (INDEPENDENT_AMBULATORY_CARE_PROVIDER_SITE_OTHER): Payer: Medicare Other | Admitting: Family Medicine

## 2012-07-06 VITALS — BP 134/80 | HR 56 | Temp 97.5°F | Ht 73.0 in | Wt 185.5 lb

## 2012-07-06 DIAGNOSIS — D649 Anemia, unspecified: Secondary | ICD-10-CM | POA: Diagnosis not present

## 2012-07-06 DIAGNOSIS — E1129 Type 2 diabetes mellitus with other diabetic kidney complication: Secondary | ICD-10-CM | POA: Diagnosis not present

## 2012-07-06 DIAGNOSIS — Z0000 Encounter for general adult medical examination without abnormal findings: Secondary | ICD-10-CM | POA: Diagnosis not present

## 2012-07-06 DIAGNOSIS — N058 Unspecified nephritic syndrome with other morphologic changes: Secondary | ICD-10-CM

## 2012-07-06 DIAGNOSIS — D696 Thrombocytopenia, unspecified: Secondary | ICD-10-CM

## 2012-07-06 DIAGNOSIS — E1121 Type 2 diabetes mellitus with diabetic nephropathy: Secondary | ICD-10-CM

## 2012-07-06 DIAGNOSIS — N183 Chronic kidney disease, stage 3 unspecified: Secondary | ICD-10-CM

## 2012-07-06 MED ORDER — ROSUVASTATIN CALCIUM 20 MG PO TABS: 20.0000 mg | ORAL_TABLET | Freq: Every day | ORAL | Status: DC

## 2012-07-06 NOTE — Assessment & Plan Note (Signed)
Chronic, stable. Lab Results  Component Value Date   HGBA1C 7.6* 06/30/2012  goal <8% for him given comorbidities and recent hypoglycemic readings. rtc 4 mo for f/u.

## 2012-07-06 NOTE — Assessment & Plan Note (Signed)
Followed by Dr. Briant Cedar

## 2012-07-06 NOTE — Patient Instructions (Addendum)
Let's drop morning novolog 70/30 to 24 units.  This should help night time lows. Call your insurance about the shingles shot to see if it is covered or how much it would cost and where is cheaper (here or pharmacy).  If you want to receive here, call for nurse visit.  Advanced directive packet provided today. Good to see you today, call us with questions. You are doing well. Continue other meds as up to now. Return in 4 months for follow up.

## 2012-07-06 NOTE — Assessment & Plan Note (Signed)
I have personally reviewed the Medicare Annual Wellness questionnaire and have noted 1. The patient's medical and social history 2. Their use of alcohol, tobacco or illicit drugs 3. Their current medications and supplements 4. The patient's functional ability including ADL's, fall risks, home safety risks and hearing or visual impairment. 5. Diet and physical activity 6. Evidence for depression or mood disorders The patients weight, height, BMI have been recorded in the chart.  Hearing and vision has been addressed. I have made referrals, counseling and provided education to the patient based review of the above and I have provided the pt with a written personalized care plan for preventive services. See scanned questionairre. Advanced directives discussed: packet provided.  i also asked him to check with family/friends on how they've completed.  Reviewed preventative protocols and updated unless pt declined. Consider colonoscopy next year for h/o polyps (vs iFOB). Check prostate next year, if normal, will likely age out given comorbidities.

## 2012-07-06 NOTE — Assessment & Plan Note (Signed)
Possibly due to ASA.  Overall very stable recently.  Consider further w/u if deteriorates.

## 2012-07-06 NOTE — Progress Notes (Signed)
Subjective:    Patient ID: Scott [name], male    DOB: January 24, 1941, 72 y.o.   MRN: [id number]  HPI CC: medicare wellness exam  Overall feels well.  Brings logs:  cbg fasting - 100-190 cbg random - 60s-120 BP - 107-144/50-70s HR - 50-60s  Wt Readings from Last 3 Encounters:  07/06/12 185 lb 8 oz (84.142 kg)  04/06/12 186 lb 12 oz (84.709 kg)  03/06/12 186 lb (84.369 kg)  Body mass index is 24.48 kg/(m^2).  Low sugars in evenings - frequent. Eats peanut butter crackers and drinks orange juice.  Hypoglycemic awareness at 60s. When sugars are low, only takes 20u nightly. Lab Results  Component Value Date   HGBA1C 7.6* 06/30/2012  novolog 70/30 26 units at 8am and 24 units at 6pm  1 fall in last year - slipped in mud walking dog, no injury Denies depression/sadness. Enjoys hobbies.  Walks dog.   Passes hearing and vision screens.  Sees Dr. Dione Booze Q6 mo for h/o glaucoma s/p laser surgery.  Preventative:  Last CPE 02/2011.  UTD pneumovax (2013). Flu shot last year Doesn't think UTD tetanus - will await given not covered by insurance.  Has never had shingles shot.  Colonoscopy 2010, rec rpt 5 yrs given personal hx colon polyps. +diverticulosis.  Due 2015 Randa Evens). Prostate screening - denies urinary changes.  Will defer screening this year.  Consider next year.  States has had checked every year and normal but no records available Advanced directives - discussed.  Interested in setting this up.  Medications and allergies reviewed and updated in chart.  Past histories reviewed and updated if relevant as below. Patient Active Problem List   Diagnosis Date Noted  . HLD (hyperlipidemia)   . CKD (chronic kidney disease) stage 3, GFR 30-59 ml/min   . Type 2 diabetes with nephropathy   . Anemia 10/11/2011  . Thrombocytopenia 10/11/2011  . CAD (coronary artery disease) 10/10/2011  . Hypertension 10/10/2011  . Hypothyroidism 10/10/2011   Past Medical History  Diagnosis Date  .  Type 2 diabetes with nephropathy   . Hypertension   . CKD (chronic kidney disease) stage 3, GFR 30-59 ml/min   . CAD (coronary artery disease) 10/10/2011    MI s/p PTCA (Dx-OM2 proximal concentric stenosis)  . Anemia 10/11/2011  . Bradycardia 10/10/2011  . Hypothyroidism 10/10/2011  . Thrombocytopenia 10/11/2011  . HLD (hyperlipidemia)   . Arthritis   . Generalized headaches     frequent  . Glaucoma     s/p laser surgery  . Colon polyps   . Acquired hand deformity 1962    hand saw accident at work   Past Surgical History  Procedure Laterality Date  . Back surgery      cervical neck  . Coronary angioplasty  1994, 1995    PTCA  . Colonoscopy  11/2008    1 polyp, diverticulosis, rec rpt 5 yrs (Dr. Randa Evens, [facility])  . Eye surgery  2012    laser surgery for glaucoma   History  Substance Use Topics  . Smoking status: Former Smoker    Quit date: 03/04/1978  . Smokeless tobacco: Never Used  . Alcohol Use: No   Family History  Problem Relation Age of Onset  . Coronary artery disease Son 49    5v CABG and stents  . Diabetes Sister   . Cancer Sister 50    stomach  . Hyperlipidemia Sister   . Stroke Neg Hx    No Known Allergies Current  Outpatient Prescriptions on File Prior to Visit  Medication Sig Dispense Refill  . aspirin 325 MG tablet Take 325 mg by mouth daily.      . carvedilol (COREG) 25 MG tablet Take 1 tablet (25 mg total) by mouth 2 (two) times daily with a meal.  80 tablet  3  . furosemide (LASIX) 40 MG tablet Take 0.5 tablets (20 mg total) by mouth daily as needed. For fluid retention  30 tablet  11  . levothyroxine (SYNTHROID, LEVOTHROID) 50 MCG tablet Take 1 tablet (50 mcg total) by mouth daily.  90 tablet  3  . lisinopril (PRINIVIL,ZESTRIL) 20 MG tablet Take 1 tablet (20 mg total) by mouth 2 (two) times daily.  180 tablet  3  . niacin 500 MG CR capsule Take 2 capsules (1,000 mg total) by mouth at bedtime.  60 capsule  11  . nitroGLYCERIN (NITROSTAT) 0.4 MG SL tablet  Place 1 tablet (0.4 mg total) under the tongue every 5 (five) minutes as needed for chest pain.  25 tablet  3  . rosuvastatin (CRESTOR) 20 MG tablet Take 1 tablet (20 mg total) by mouth at bedtime.  90 tablet  3  . zolpidem (AMBIEN) 10 MG tablet TAKE ONE-HALF TABLET BY MOUTH AT BEDTIME  30 tablet  0   No current facility-administered medications on file prior to visit.     Review of Systems  Constitutional: Negative for fever, chills, activity change, appetite change, fatigue and unexpected weight change.  HENT: Negative for hearing loss and neck pain.   Eyes: Negative for visual disturbance.  Respiratory: Negative for cough, chest tightness, shortness of breath and wheezing.   Cardiovascular: Positive for leg swelling. Negative for chest pain and palpitations.  Gastrointestinal: Negative for nausea, vomiting, abdominal pain, diarrhea, constipation, blood in stool and abdominal distention.  Genitourinary: Negative for hematuria and difficulty urinating.  Musculoskeletal: Negative for myalgias and arthralgias.  Skin: Negative for rash.  Neurological: Negative for dizziness, seizures, syncope and headaches.  Hematological: Negative for adenopathy. Does not bruise/bleed easily.  Psychiatric/Behavioral: Negative for dysphoric mood. The patient is not nervous/anxious.        Objective:   Physical Exam  Nursing note and vitals reviewed. Constitutional: He is oriented to person, place, and time. He appears well-developed and well-nourished. No distress.  HENT:  Head: Normocephalic and atraumatic.  Nose: Nose normal.  Mouth/Throat: Oropharynx is clear and moist. No oropharyngeal exudate.  Eyes: Conjunctivae and EOM are normal. Pupils are equal, round, and reactive to light. No scleral icterus.  Neck: Normal range of motion. Neck supple. Carotid bruit is not present. No thyromegaly present.  Cardiovascular: Normal rate, regular rhythm, normal heart sounds and intact distal pulses.   No  murmur heard. Pulses:      Radial pulses are 2+ on the right side, and 2+ on the left side.  Pulmonary/Chest: Effort normal and breath sounds normal. No respiratory distress. He has no wheezes. He has no rales.  Musculoskeletal: Normal range of motion. He exhibits edema (tr pedal edema).  Lymphadenopathy:    He has no cervical adenopathy.  Neurological: He is alert and oriented to person, place, and time.  CN grossly intact, station and gait intact  Skin: Skin is warm and dry. No rash noted.  Psychiatric: He has a normal mood and affect.       Assessment & Plan:

## 2012-07-06 NOTE — Assessment & Plan Note (Signed)
Continued improvement.  Anticipate CKD anemia.

## 2012-07-31 DIAGNOSIS — H4011X0 Primary open-angle glaucoma, stage unspecified: Secondary | ICD-10-CM | POA: Diagnosis not present

## 2012-07-31 DIAGNOSIS — H04129 Dry eye syndrome of unspecified lacrimal gland: Secondary | ICD-10-CM | POA: Diagnosis not present

## 2012-07-31 DIAGNOSIS — E119 Type 2 diabetes mellitus without complications: Secondary | ICD-10-CM | POA: Diagnosis not present

## 2012-07-31 DIAGNOSIS — H2510 Age-related nuclear cataract, unspecified eye: Secondary | ICD-10-CM | POA: Diagnosis not present

## 2012-07-31 LAB — HM DIABETES EYE EXAM

## 2012-08-04 ENCOUNTER — Encounter: Payer: Self-pay | Admitting: Family Medicine

## 2012-08-07 ENCOUNTER — Other Ambulatory Visit: Payer: Self-pay | Admitting: *Deleted

## 2012-08-07 MED ORDER — CARVEDILOL 25 MG PO TABS: 25.0000 mg | ORAL_TABLET | Freq: Two times a day (BID) | ORAL | Status: DC

## 2012-08-12 ENCOUNTER — Other Ambulatory Visit: Payer: Self-pay | Admitting: Family Medicine

## 2012-08-12 NOTE — Telephone Encounter (Signed)
Plz phone in

## 2012-08-13 ENCOUNTER — Telehealth: Payer: Self-pay

## 2012-08-13 DIAGNOSIS — E039 Hypothyroidism, unspecified: Secondary | ICD-10-CM

## 2012-08-13 NOTE — Telephone Encounter (Signed)
Ok to do if pt ok with this - but will need to come in 6 wks after change in med for TSH. Lab Results  Component Value Date   TSH 0.79 06/30/2012

## 2012-08-13 NOTE — Telephone Encounter (Signed)
[facility] [address] faxed question; ;is it OK to switch to [facility] generic Levothyroxine.Please advise.

## 2012-08-13 NOTE — Telephone Encounter (Signed)
Rx called in as directed.   

## 2012-08-17 NOTE — Telephone Encounter (Signed)
Pt came by office; said OK with him to change to [facility], [facility] notified; Scott [name] scheduled appt for 6 weeks for TSH 09/28/12 at 8:15 am.

## 2012-09-10 ENCOUNTER — Other Ambulatory Visit: Payer: Self-pay | Admitting: Family Medicine

## 2012-09-11 ENCOUNTER — Other Ambulatory Visit: Payer: Self-pay | Admitting: Family Medicine

## 2012-09-11 NOTE — Telephone Encounter (Signed)
Medication phoned to pharmacy.  

## 2012-09-11 NOTE — Telephone Encounter (Signed)
plz phone in. 

## 2012-09-13 NOTE — Telephone Encounter (Signed)
plz phone in. 

## 2012-09-14 NOTE — Telephone Encounter (Signed)
Rx called in as directed.   

## 2012-09-25 ENCOUNTER — Encounter: Payer: Self-pay | Admitting: Radiology

## 2012-09-28 ENCOUNTER — Other Ambulatory Visit (INDEPENDENT_AMBULATORY_CARE_PROVIDER_SITE_OTHER): Payer: Medicare Other

## 2012-09-28 DIAGNOSIS — E039 Hypothyroidism, unspecified: Secondary | ICD-10-CM | POA: Diagnosis not present

## 2012-09-28 LAB — TSH: TSH: 1.67 u[IU]/mL (ref 0.35–5.50)

## 2012-09-29 ENCOUNTER — Encounter: Payer: Self-pay | Admitting: *Deleted

## 2012-09-30 DIAGNOSIS — Z79899 Other long term (current) drug therapy: Secondary | ICD-10-CM | POA: Diagnosis not present

## 2012-10-01 DIAGNOSIS — N184 Chronic kidney disease, stage 4 (severe): Secondary | ICD-10-CM | POA: Diagnosis not present

## 2012-10-01 DIAGNOSIS — I10 Essential (primary) hypertension: Secondary | ICD-10-CM | POA: Diagnosis not present

## 2012-10-06 ENCOUNTER — Encounter: Payer: Self-pay | Admitting: Family Medicine

## 2012-10-16 ENCOUNTER — Encounter: Payer: Self-pay | Admitting: Family Medicine

## 2012-10-23 ENCOUNTER — Encounter: Payer: Self-pay | Admitting: Nephrology

## 2012-11-06 ENCOUNTER — Encounter: Payer: Self-pay | Admitting: Family Medicine

## 2012-11-06 ENCOUNTER — Ambulatory Visit (INDEPENDENT_AMBULATORY_CARE_PROVIDER_SITE_OTHER): Payer: Medicare Other | Admitting: Family Medicine

## 2012-11-06 VITALS — BP 130/84 | HR 56 | Temp 97.7°F | Wt 183.2 lb

## 2012-11-06 DIAGNOSIS — E1129 Type 2 diabetes mellitus with other diabetic kidney complication: Secondary | ICD-10-CM | POA: Diagnosis not present

## 2012-11-06 DIAGNOSIS — E1121 Type 2 diabetes mellitus with diabetic nephropathy: Secondary | ICD-10-CM

## 2012-11-06 DIAGNOSIS — Z23 Encounter for immunization: Secondary | ICD-10-CM

## 2012-11-06 DIAGNOSIS — N058 Unspecified nephritic syndrome with other morphologic changes: Secondary | ICD-10-CM

## 2012-11-06 MED ORDER — LISINOPRIL 20 MG PO TABS: 20.0000 mg | ORAL_TABLET | Freq: Two times a day (BID) | ORAL | Status: DC

## 2012-11-06 MED ORDER — GLUCOSE BLOOD VI STRP: ORAL_STRIP | Status: DC

## 2012-11-06 MED ORDER — LEVOTHYROXINE SODIUM 50 MCG PO TABS: 50.0000 ug | ORAL_TABLET | Freq: Every day | ORAL | Status: DC

## 2012-11-06 NOTE — Assessment & Plan Note (Signed)
Concerning that he's having low sugars after dinner and PM 70/30 dose. Will decrease AM and PM dose to 22u bid. Defer blood work today. Foot exam today.  UTD eye exam and pneumovax.

## 2012-11-06 NOTE — Progress Notes (Signed)
  Subjective:    Patient ID: Scott [name], male    DOB: [birth year], 72 y.o.   MRN: [id number]  HPI CC: 4 mo f/u  DM - last visit we decreased insulin to 24 u in am.  Notes intermittently bottoming out in evenings after dinner and 70/30 shot - as low as 40 - needs to drink ritz peanut butter crackers with OJ and this helps.  Hypoglycemic awareness to 60s.  Takes 70/30 novolog with meals twice daily around 8am and 8pm.  Most lows are at night time. Occasional paresthesias. Eye exam every 6 months, last 07/2012.   Foot exam today. Lab Results  Component Value Date   HGBA1C 7.6* 06/30/2012   Brings log of sugars:  Fasting 110-200s PM sugars 40-70s.  Brings bp log: 90-140/50-60s  BP Readings from Last 3 Encounters:  11/06/12 130/84  07/06/12 134/80  04/06/12 124/82   Wt Readings from Last 3 Encounters:  11/06/12 183 lb 4 oz (83.122 kg)  07/06/12 185 lb 8 oz (84.142 kg)  04/06/12 186 lb 12 oz (84.709 kg)   Lab Results  Component Value Date   CREATININE 2.2* 06/30/2012   Past Medical History  Diagnosis Date  . Type 2 diabetes with nephropathy   . Hypertension   . CKD (chronic kidney disease) stage 3, GFR 30-59 ml/min     [name]  . CAD (coronary artery disease) 10/10/2011    MI s/p PTCA (Dx-OM2 proximal concentric stenosis)  . Anemia 10/11/2011  . Bradycardia 10/10/2011  . Hypothyroidism 10/10/2011  . Thrombocytopenia 10/11/2011  . HLD (hyperlipidemia)   . Arthritis   . Generalized headaches     frequent  . Glaucoma     s/p laser surgery  . Colon polyps   . Acquired hand deformity 1962    hand saw accident at work     Review of Systems Per HPI    Objective:   Physical Exam  Nursing note and vitals reviewed. Constitutional: He appears well-developed and well-nourished. No distress.  HENT:  Head: Normocephalic and atraumatic.  Mouth/Throat: Oropharynx is clear and moist. No oropharyngeal exudate.  Eyes: Conjunctivae and EOM are normal. Pupils are equal, round, and  reactive to light. No scleral icterus.  Neck: Normal range of motion. Neck supple.  Cardiovascular: Normal rate, regular rhythm, normal heart sounds and intact distal pulses.   No murmur heard. Pulmonary/Chest: Effort normal and breath sounds normal. No respiratory distress. He has no wheezes. He has no rales.  Musculoskeletal: He exhibits no edema.  Diabetic foot exam: Normal inspection No skin breakdown No calluses  Normal DP/PT pulses Normal sensation to light touch and monofilament Nails normal  Lymphadenopathy:    He has no cervical adenopathy.  Skin: Skin is warm and dry. No rash noted.  Psychiatric: He has a normal mood and affect.       Assessment & Plan:

## 2012-11-06 NOTE — Addendum Note (Signed)
Addended by: Josph Macho A on: 11/06/2012 11:05 AM   Modules accepted: Orders

## 2012-11-06 NOTE — Patient Instructions (Signed)
Let's decrease insulin to 22 units in the morning and 22 units in the evenings. Return in 4 months for diabetes follow up. Good to see you today, call us with questions

## 2012-11-26 ENCOUNTER — Other Ambulatory Visit: Payer: Self-pay | Admitting: *Deleted

## 2012-11-26 MED ORDER — GLUCOSE BLOOD VI STRP: ORAL_STRIP | Status: DC

## 2013-01-02 LAB — HM DIABETES EYE EXAM

## 2013-01-14 ENCOUNTER — Other Ambulatory Visit: Payer: Self-pay | Admitting: Family Medicine

## 2013-01-14 NOTE — Telephone Encounter (Signed)
plz phone in. 

## 2013-01-14 NOTE — Telephone Encounter (Signed)
Ok to refill 

## 2013-01-15 ENCOUNTER — Other Ambulatory Visit: Payer: Self-pay | Admitting: Family Medicine

## 2013-01-15 NOTE — Telephone Encounter (Signed)
plz phone in if not already done. 

## 2013-01-15 NOTE — Telephone Encounter (Signed)
Already called in this AM. 

## 2013-01-15 NOTE — Telephone Encounter (Signed)
Rx called in as directed.   

## 2013-02-01 ENCOUNTER — Other Ambulatory Visit: Payer: Self-pay | Admitting: Family Medicine

## 2013-02-01 NOTE — Telephone Encounter (Signed)
Rx called in as directed.   

## 2013-02-01 NOTE — Telephone Encounter (Signed)
plz phone in. 

## 2013-02-02 DIAGNOSIS — H0230 Blepharochalasis unspecified eye, unspecified eyelid: Secondary | ICD-10-CM | POA: Diagnosis not present

## 2013-02-02 DIAGNOSIS — H2510 Age-related nuclear cataract, unspecified eye: Secondary | ICD-10-CM | POA: Diagnosis not present

## 2013-02-02 DIAGNOSIS — H4011X0 Primary open-angle glaucoma, stage unspecified: Secondary | ICD-10-CM | POA: Diagnosis not present

## 2013-02-02 DIAGNOSIS — H04129 Dry eye syndrome of unspecified lacrimal gland: Secondary | ICD-10-CM | POA: Diagnosis not present

## 2013-03-17 ENCOUNTER — Encounter: Payer: Self-pay | Admitting: Family Medicine

## 2013-03-17 ENCOUNTER — Ambulatory Visit (INDEPENDENT_AMBULATORY_CARE_PROVIDER_SITE_OTHER): Payer: Medicare Other | Admitting: Family Medicine

## 2013-03-17 ENCOUNTER — Other Ambulatory Visit: Payer: Self-pay | Admitting: Family Medicine

## 2013-03-17 VITALS — BP 114/62 | HR 60 | Temp 97.6°F | Wt 183.5 lb

## 2013-03-17 DIAGNOSIS — I10 Essential (primary) hypertension: Secondary | ICD-10-CM

## 2013-03-17 DIAGNOSIS — N183 Chronic kidney disease, stage 3 unspecified: Secondary | ICD-10-CM

## 2013-03-17 DIAGNOSIS — E785 Hyperlipidemia, unspecified: Secondary | ICD-10-CM

## 2013-03-17 DIAGNOSIS — Z23 Encounter for immunization: Secondary | ICD-10-CM | POA: Diagnosis not present

## 2013-03-17 DIAGNOSIS — Z2911 Encounter for prophylactic immunotherapy for respiratory syncytial virus (RSV): Secondary | ICD-10-CM | POA: Diagnosis not present

## 2013-03-17 DIAGNOSIS — N058 Unspecified nephritic syndrome with other morphologic changes: Secondary | ICD-10-CM

## 2013-03-17 DIAGNOSIS — E1129 Type 2 diabetes mellitus with other diabetic kidney complication: Secondary | ICD-10-CM

## 2013-03-17 DIAGNOSIS — E039 Hypothyroidism, unspecified: Secondary | ICD-10-CM

## 2013-03-17 DIAGNOSIS — E1121 Type 2 diabetes mellitus with diabetic nephropathy: Secondary | ICD-10-CM

## 2013-03-17 LAB — BASIC METABOLIC PANEL
BUN: 25 mg/dL — AB (ref 6–23)
CHLORIDE: 108 meq/L (ref 96–112)
CO2: 28 mEq/L (ref 19–32)
CREATININE: 1.7 mg/dL — AB (ref 0.4–1.5)
Calcium: 10.4 mg/dL (ref 8.4–10.5)
GFR: 43.1 mL/min — AB (ref >60.00)
Glucose, Bld: 93 mg/dL (ref 70–99)
Potassium: 4.1 mEq/L (ref 3.5–5.1)
Sodium: 141 mEq/L (ref 135–145)

## 2013-03-17 LAB — HEMOGLOBIN A1C: HEMOGLOBIN A1C: 8 % — AB (ref 4.6–6.5)

## 2013-03-17 LAB — TSH: TSH: 1.19 u[IU]/mL (ref 0.35–5.50)

## 2013-03-17 NOTE — Patient Instructions (Addendum)
Blood work today - we will discuss diabetes doctor referral if A1c >8%.  No changes for now until we get blood work back. Good to see you today, call us with quesitons. Look at website for more resources [email or web address] Pass by [place]'s office for referral to diabetes educationrefresher course

## 2013-03-17 NOTE — Progress Notes (Signed)
Subjective:    Patient ID: Scott [name], male    DOB: 09/07/1940, 73 y.o.   MRN: [id number]  HPI CC: 4 mo f/u  Overall doing well.  Sunday had stomach bug with abd cramping, watery diarrhea.  Yesterday started feeling better.  Started pepto bismol.  No nausea/vomiting.  No recent abx use.  Also having some nose bleeds since last seen here.  I recommended against decreased aspirin dose or skipped doses.  Rather recommended nasal saline irrigation.  DM - regularly does check sugars fasting: 130-200, PM 59-188.  No rhyme or reason to his sugar levels.  Compliant with antihyperglycemic regimen which includes: 70/30 22u bid.  He has had low sugars (down to 50) with hypoglycemic symptoms.  Nighttime paresthesias. Last diabetic eye exam 01/2013.  Pneumovax: 2013.   Injections into stomach, alternates sites.  Does wait several seconds after injections.  Times insulin injection with meals.  Did complete diabetes education 4-5 years ago.  HTN - Compliant with current antihypertensive regimen of carvedilol 25mg  bid, and lisinopril 20mg  bid.  Also on lasix 20mg  daily prn swelling - hasn't been needing.  Does check blood pressures at home: 110-150/60s, HR 48-60s.  No low blood pressure readings or symptoms of dizziness/syncope.  Denies HA, vision changes, CP/tightness, SOB, leg swelling.  HLD - complaint with crestor 20mg  nightlyl and niacin 1000mg  CR nightly.  No myalgias  Hypothyroid - compliant with levothyroxine 4mcg daily.  Lab Results  Component Value Date   HGBA1C 7.6* 06/30/2012    BP Readings from Last 3 Encounters:  03/17/13 114/62  11/06/12 130/84  07/06/12 134/80    Wt Readings from Last 3 Encounters:  03/17/13 183 lb 8 oz (83.235 kg)  11/06/12 183 lb 4 oz (83.122 kg)  07/06/12 185 lb 8 oz (84.142 kg)    Past Medical History  Diagnosis Date  . Type 2 diabetes with nephropathy   . Hypertension   . CKD (chronic kidney disease) stage 3, GFR 30-59 ml/min     [name]  . CAD  (coronary artery disease) 10/10/2011    MI s/p PTCA (Dx-OM2 proximal concentric stenosis)  . Anemia 10/11/2011  . Bradycardia 10/10/2011  . Hypothyroidism 10/10/2011  . Thrombocytopenia 10/11/2011  . HLD (hyperlipidemia)   . Arthritis   . Generalized headaches     frequent  . Glaucoma     s/p laser surgery  . Colon polyps   . Acquired hand deformity 1962    hand saw accident at work    Review of Systems Per HPi    Objective:   Physical Exam  Nursing note and vitals reviewed. Constitutional: He appears well-developed and well-nourished. No distress.  HENT:  Head: Normocephalic and atraumatic.  Right Ear: External ear normal.  Left Ear: External ear normal.  Nose: Nose normal.  Mouth/Throat: Oropharynx is clear and moist. No oropharyngeal exudate.  Eyes: Conjunctivae and EOM are normal. Pupils are equal, round, and reactive to light. No scleral icterus.  Neck: Normal range of motion. Neck supple. Carotid bruit is not present.  Cardiovascular: Normal rate, regular rhythm, normal heart sounds and intact distal pulses.   No murmur heard. Pulmonary/Chest: Effort normal and breath sounds normal. No respiratory distress. He has no wheezes. He has no rales.  Musculoskeletal: He exhibits no edema.  Diabetic foot exam: Normal inspection No skin breakdown No calluses  Normal DP/PT pulses Normal sensation to light tough and monofilament Nails normal  Lymphadenopathy:    He has no cervical adenopathy.  Skin:  Skin is warm and dry. No rash noted.  Psychiatric: He has a normal mood and affect.       Assessment & Plan:

## 2013-03-17 NOTE — Assessment & Plan Note (Signed)
Chronic, overall stable. No changes today

## 2013-03-17 NOTE — Assessment & Plan Note (Signed)
Check TSH today

## 2013-03-17 NOTE — Assessment & Plan Note (Signed)
Followed by renal. °

## 2013-03-17 NOTE — Progress Notes (Signed)
Patient aware he is responsible for cost of shingles vaccine if his insurance does not cover it. KAD

## 2013-03-17 NOTE — Assessment & Plan Note (Addendum)
persistent highs and lows - without firm pattern appreciated. I will check A1c today without changes to insulin.  If >8%, will recommend referral to endocrinologist for further eval. Would anticipate goal A1c for him <8% given comorbidities Want to avoid hypoglycemia 2/2 CAD and other comorbidities.  Pt agrees. Will also refer for refresher course on diabetes.

## 2013-03-17 NOTE — Assessment & Plan Note (Signed)
Continue crestor and niaspan.

## 2013-03-17 NOTE — Progress Notes (Signed)
Pre-visit discussion using our clinic review tool. No additional management support is needed unless otherwise documented below in the visit note.  

## 2013-03-17 NOTE — Addendum Note (Signed)
Addended by: Royann Shivers A on: 03/17/2013 11:07 AM   Modules accepted: Orders

## 2013-03-18 ENCOUNTER — Telehealth: Payer: Self-pay | Admitting: Family Medicine

## 2013-03-18 ENCOUNTER — Telehealth: Payer: Self-pay

## 2013-03-18 NOTE — Telephone Encounter (Signed)
Relevant patient education assigned to patient using Emmi. ° °

## 2013-03-31 DIAGNOSIS — N183 Chronic kidney disease, stage 3 unspecified: Secondary | ICD-10-CM | POA: Diagnosis not present

## 2013-03-31 DIAGNOSIS — I129 Hypertensive chronic kidney disease with stage 1 through stage 4 chronic kidney disease, or unspecified chronic kidney disease: Secondary | ICD-10-CM | POA: Diagnosis not present

## 2013-03-31 DIAGNOSIS — E1129 Type 2 diabetes mellitus with other diabetic kidney complication: Secondary | ICD-10-CM | POA: Diagnosis not present

## 2013-03-31 DIAGNOSIS — R809 Proteinuria, unspecified: Secondary | ICD-10-CM | POA: Diagnosis not present

## 2013-04-12 ENCOUNTER — Ambulatory Visit: Payer: Self-pay

## 2013-04-12 DIAGNOSIS — I10 Essential (primary) hypertension: Secondary | ICD-10-CM | POA: Diagnosis not present

## 2013-04-12 DIAGNOSIS — Z7189 Other specified counseling: Secondary | ICD-10-CM | POA: Diagnosis not present

## 2013-04-12 DIAGNOSIS — E119 Type 2 diabetes mellitus without complications: Secondary | ICD-10-CM | POA: Diagnosis not present

## 2013-04-21 ENCOUNTER — Encounter: Payer: Self-pay | Admitting: Family Medicine

## 2013-04-26 ENCOUNTER — Other Ambulatory Visit: Payer: Self-pay | Admitting: Family Medicine

## 2013-05-02 ENCOUNTER — Ambulatory Visit: Payer: Self-pay

## 2013-05-02 DIAGNOSIS — E119 Type 2 diabetes mellitus without complications: Secondary | ICD-10-CM | POA: Diagnosis not present

## 2013-05-02 DIAGNOSIS — Z7189 Other specified counseling: Secondary | ICD-10-CM | POA: Diagnosis not present

## 2013-05-02 DIAGNOSIS — E785 Hyperlipidemia, unspecified: Secondary | ICD-10-CM | POA: Diagnosis not present

## 2013-05-02 DIAGNOSIS — I129 Hypertensive chronic kidney disease with stage 1 through stage 4 chronic kidney disease, or unspecified chronic kidney disease: Secondary | ICD-10-CM | POA: Diagnosis not present

## 2013-05-02 DIAGNOSIS — N183 Chronic kidney disease, stage 3 unspecified: Secondary | ICD-10-CM | POA: Diagnosis not present

## 2013-05-10 ENCOUNTER — Other Ambulatory Visit: Payer: Self-pay | Admitting: *Deleted

## 2013-05-10 MED ORDER — ZOLPIDEM TARTRATE 10 MG PO TABS: ORAL_TABLET | ORAL | Status: DC

## 2013-05-10 NOTE — Telephone Encounter (Signed)
Rx called in as directed/cs 

## 2013-05-10 NOTE — Telephone Encounter (Signed)
Ok to refill 

## 2013-05-10 NOTE — Telephone Encounter (Signed)
plz phone in. 

## 2013-06-02 ENCOUNTER — Ambulatory Visit: Payer: Self-pay

## 2013-06-30 ENCOUNTER — Other Ambulatory Visit: Payer: Self-pay

## 2013-06-30 MED ORDER — ZOLPIDEM TARTRATE 10 MG PO TABS: ORAL_TABLET | ORAL | Status: DC

## 2013-06-30 NOTE — Telephone Encounter (Signed)
plz phone in. 

## 2013-06-30 NOTE — Telephone Encounter (Signed)
Rx called in as directed.   

## 2013-06-30 NOTE — Telephone Encounter (Signed)
pts wife request refill zolpidem to [facility] [address]. Please advise. Pt is out of med.

## 2013-07-17 ENCOUNTER — Other Ambulatory Visit: Payer: Self-pay | Admitting: Family Medicine

## 2013-07-21 ENCOUNTER — Encounter: Payer: Self-pay | Admitting: Family Medicine

## 2013-07-21 ENCOUNTER — Ambulatory Visit (INDEPENDENT_AMBULATORY_CARE_PROVIDER_SITE_OTHER): Payer: Medicare Other | Admitting: Family Medicine

## 2013-07-21 VITALS — BP 124/72 | HR 56 | Temp 97.5°F | Wt 184.5 lb

## 2013-07-21 DIAGNOSIS — E1129 Type 2 diabetes mellitus with other diabetic kidney complication: Secondary | ICD-10-CM | POA: Diagnosis not present

## 2013-07-21 DIAGNOSIS — N058 Unspecified nephritic syndrome with other morphologic changes: Secondary | ICD-10-CM | POA: Diagnosis not present

## 2013-07-21 DIAGNOSIS — Z23 Encounter for immunization: Secondary | ICD-10-CM

## 2013-07-21 DIAGNOSIS — E1121 Type 2 diabetes mellitus with diabetic nephropathy: Secondary | ICD-10-CM

## 2013-07-21 NOTE — Addendum Note (Signed)
Addended by: Royann Shivers A on: 07/21/2013 11:36 AM   Modules accepted: Orders

## 2013-07-21 NOTE — Progress Notes (Signed)
BP 124/72  Pulse 56  Temp(Src) 97.5 F (36.4 C) (Oral)  Wt 184 lb 8 oz (83.689 kg)   CC: 4 mo f/u  Subjective:    Patient ID: Scott [name], male    DOB: Apr 12, 1940, 73 y.o.   MRN: [id number]  HPI: Scott [name] is a 73 y.o. male presenting on 07/21/2013 for Follow-up   DM - regularly does check sugars fasting.  Highest sugar has been 250.  Overall <200 however. Compliant with antihyperglycemic regimen which includes: novolog 70/30 19u bid. Occasional hypoglycemia. Night-time paresthesias. Last diabetic eye exam 01/2013. Pneumovax: 2013. Prevnar: today. Injections into stomach, alternates sites. Does wait several seconds after injections. Times insulin injection with meals. Did complete diabetes education 4-5 years ago. Lab Results  Component Value Date   HGBA1C 8.0* 03/17/2013  I asked him to change novolog 70/30 to 20 units if mealtime cbg <100 and 24 units if mealtime cbg >200 otherwise 22 units. However when he took 22 and 24, noticed increased low sugars. Last few weeks has been taking 19 u bid. This morning fasting sugar 114.  Relevant past medical, surgical, family and social history reviewed and updated as indicated.  Allergies and medications reviewed and updated. Current Outpatient Prescriptions on File Prior to Visit  Medication Sig  . acetaminophen (TYLENOL) 500 MG tablet Take 500 mg by mouth every 6 (six) hours as needed for pain.  Marland Kitchen aspirin 325 MG tablet Take 325 mg by mouth daily.  . carvedilol (COREG) 25 MG tablet TAKE ONE TABLET BY MOUTH TWICE DAILY WITH A MEAL  . CRESTOR 20 MG tablet TAKE ONE TABLET BY MOUTH AT BEDTIME  . furosemide (LASIX) 40 MG tablet Take 0.5 tablets (20 mg total) by mouth daily as needed. For fluid retention  . glucose blood test strip Use to test blood sugar twice daily or as directed. Dx 250.40,  583.81  . levothyroxine (SYNTHROID, LEVOTHROID) 50 MCG tablet Take 1 tablet (50 mcg total) by mouth daily.  . Multiple Vitamin (MULTIVITAMIN)  tablet Take 1 tablet by mouth daily.  . niacin 500 MG CR capsule Take 2 capsules (1,000 mg total) by mouth at bedtime.  . nitroGLYCERIN (NITROSTAT) 0.4 MG SL tablet Place 1 tablet (0.4 mg total) under the tongue every 5 (five) minutes as needed for chest pain.  Marland Kitchen lisinopril (PRINIVIL,ZESTRIL) 20 MG tablet Take 1 tablet (20 mg total) by mouth 2 (two) times daily.  Marland Kitchen NOVOLOG MIX 70/30 FLEXPEN (70-30) 100 UNIT/ML Pen INJECT 26 UNITS SQ IN THE MORNING AND INJECT 24 UNITS SQ AT NIGHT   No current facility-administered medications on file prior to visit.    Review of Systems Per HPI unless specifically indicated above    Objective:    BP 124/72  Pulse 56  Temp(Src) 97.5 F (36.4 C) (Oral)  Wt 184 lb 8 oz (83.689 kg)  Physical Exam  Nursing note and vitals reviewed. Constitutional: He appears well-developed and well-nourished. No distress.  HENT:  Mouth/Throat: Oropharynx is clear and moist. No oropharyngeal exudate.  Cardiovascular: Normal rate, regular rhythm, normal heart sounds and intact distal pulses.   No murmur heard. Pulmonary/Chest: Effort normal and breath sounds normal. No respiratory distress. He has no wheezes. He has no rales.  Musculoskeletal: He exhibits no edema.   Results for orders placed in visit on 03/17/13  HEMOGLOBIN A1C      Result Value Ref Range   Hemoglobin A1C 8.0 (*) 4.6 - 6.5 %  BASIC METABOLIC PANEL  Result Value Ref Range   Sodium 141  135 - 145 mEq/L   Potassium 4.1  3.5 - 5.1 mEq/L   Chloride 108  96 - 112 mEq/L   CO2 28  19 - 32 mEq/L   Glucose, Bld 93  70 - 99 mg/dL   BUN 25 (*) 6 - 23 mg/dL   Creatinine, Ser 1.7 (*) 0.4 - 1.5 mg/dL   Calcium 10.4  8.4 - 10.5 mg/dL   GFR 43.10 (*) >60.00 mL/min  TSH      Result Value Ref Range   TSH 1.19  0.35 - 5.50 uIU/mL  HM DIABETES EYE EXAM      Result Value Ref Range   HM Diabetic Eye Exam per patient         Assessment & Plan:   Problem List Items Addressed This Visit   Type 2 diabetes  with nephropathy - Primary     Still with some low sugars with prior regimen. Discussed change in insulin regimen to basal + mealtime coverage but pt just filled novolog 70/30, concern with finances and donut hole.  Will continue current insulin. Will return to 19-20 u bid of 70/30 to minimize low sugars. Provided with sugar log to bring filled out to next visit.        Follow up plan: Return in about 3 months (around 10/21/2013), or if symptoms worsen or fail to improve, for follow up visit.

## 2013-07-21 NOTE — Progress Notes (Signed)
Pre visit review using our clinic review tool, if applicable. No additional management support is needed unless otherwise documented below in the visit note. 

## 2013-07-21 NOTE — Assessment & Plan Note (Signed)
Still with some low sugars with prior regimen. Discussed change in insulin regimen to basal + mealtime coverage but pt just filled novolog 70/30, concern with finances and donut hole.  Will continue current insulin. Will return to 19-20 u bid of 70/30 to minimize low sugars. Provided with sugar log to bring filled out to next visit.

## 2013-07-21 NOTE — Patient Instructions (Addendum)
Prevnar today. Good to see you today. Return in 51months for follow up Continue keeping track of sugars as up to now, but 2 weeks prior to next appointment use log I gave you today to monitor sugars - several times a day but at different times and jot down numbers for [place] to review.

## 2013-08-04 DIAGNOSIS — H2510 Age-related nuclear cataract, unspecified eye: Secondary | ICD-10-CM | POA: Diagnosis not present

## 2013-08-04 DIAGNOSIS — H31099 Other chorioretinal scars, unspecified eye: Secondary | ICD-10-CM | POA: Diagnosis not present

## 2013-08-04 DIAGNOSIS — E119 Type 2 diabetes mellitus without complications: Secondary | ICD-10-CM | POA: Diagnosis not present

## 2013-08-04 DIAGNOSIS — H02839 Dermatochalasis of unspecified eye, unspecified eyelid: Secondary | ICD-10-CM | POA: Diagnosis not present

## 2013-08-04 DIAGNOSIS — H4011X0 Primary open-angle glaucoma, stage unspecified: Secondary | ICD-10-CM | POA: Diagnosis not present

## 2013-08-04 LAB — HM DIABETES EYE EXAM

## 2013-08-05 ENCOUNTER — Encounter: Payer: Self-pay | Admitting: Family Medicine

## 2013-08-28 ENCOUNTER — Other Ambulatory Visit: Payer: Self-pay | Admitting: Family Medicine

## 2013-08-29 NOTE — Telephone Encounter (Signed)
plz phone in. 

## 2013-08-30 NOTE — Telephone Encounter (Signed)
Rx called in as directed.   

## 2013-09-13 ENCOUNTER — Other Ambulatory Visit: Payer: Self-pay | Admitting: Family Medicine

## 2013-09-14 ENCOUNTER — Encounter: Payer: Self-pay | Admitting: Family Medicine

## 2013-09-20 ENCOUNTER — Other Ambulatory Visit: Payer: Self-pay | Admitting: Family Medicine

## 2013-10-02 HISTORY — PX: US ECHOCARDIOGRAPHY: HXRAD669

## 2013-10-07 ENCOUNTER — Encounter: Payer: Self-pay | Admitting: *Deleted

## 2013-10-16 LAB — BASIC METABOLIC PANEL: Creatinine: 1.6 mg/dL — AB (ref <=1.3)

## 2013-10-16 LAB — HEMOGLOBIN A1C: Hgb A1c MFr Bld: 7.7 % — AB (ref 4.0–6.0)

## 2013-10-18 DIAGNOSIS — R809 Proteinuria, unspecified: Secondary | ICD-10-CM | POA: Diagnosis not present

## 2013-10-18 DIAGNOSIS — E1129 Type 2 diabetes mellitus with other diabetic kidney complication: Secondary | ICD-10-CM | POA: Diagnosis not present

## 2013-10-18 DIAGNOSIS — I129 Hypertensive chronic kidney disease with stage 1 through stage 4 chronic kidney disease, or unspecified chronic kidney disease: Secondary | ICD-10-CM | POA: Diagnosis not present

## 2013-10-18 DIAGNOSIS — N183 Chronic kidney disease, stage 3 unspecified: Secondary | ICD-10-CM | POA: Diagnosis not present

## 2013-10-19 ENCOUNTER — Encounter: Payer: Self-pay | Admitting: Cardiology

## 2013-10-19 ENCOUNTER — Ambulatory Visit (INDEPENDENT_AMBULATORY_CARE_PROVIDER_SITE_OTHER): Payer: Medicare Other | Admitting: Cardiology

## 2013-10-19 VITALS — BP 138/82 | HR 59 | Ht 71.0 in | Wt 181.0 lb

## 2013-10-19 DIAGNOSIS — I2510 Atherosclerotic heart disease of native coronary artery without angina pectoris: Secondary | ICD-10-CM | POA: Diagnosis not present

## 2013-10-19 DIAGNOSIS — I10 Essential (primary) hypertension: Secondary | ICD-10-CM

## 2013-10-19 NOTE — Patient Instructions (Signed)
Your physician wants you to follow-up in: [place] will receive a reminder letter in the mail two months in advance. If you don't receive a letter, please call our office to schedule the follow-up appointment.

## 2013-10-19 NOTE — Progress Notes (Signed)
Patient ID: Scott [name], male   DOB: [birth year], 73 y.o.   MRN: [id number]     Patient Name: Scott [name] Date of Encounter: 10/19/2013  Primary Care Provider:  [name], MD Primary Cardiologist: Dorothy Spark (Dr Verl Blalock)  Problem List   Past Medical History  Diagnosis Date  . Type 2 diabetes with nephropathy     DM refresher course [facility] (04/2013)  . Hypertension   . CKD (chronic kidney disease) stage 3, GFR 30-59 ml/min     [name]  . CAD (coronary artery disease) 10/10/2011    MI s/p PTCA (Dx-OM2 proximal concentric stenosis)  . Anemia 10/11/2011  . Bradycardia 10/10/2011  . Hypothyroidism 10/10/2011  . Thrombocytopenia 10/11/2011  . HLD (hyperlipidemia)   . Arthritis   . Generalized headaches     frequent  . Glaucoma     s/p laser surgery  . Colon polyps   . Acquired hand deformity 1962    hand saw accident at work   Past Surgical History  Procedure Laterality Date  . Back surgery      cervical neck  . [place]  . Colonoscopy  11/2008    1 polyp, diverticulosis, rec rpt 5 yrs (Dr. Oletta Lamas, [name])  . Eye surgery  2012    laser surgery for glaucoma    Allergies  No Known Allergies  HPI  [name] is a former patient of Dr Verl Blalock who underwent 2 cardiac caths in 1990 for chest pain and it is unclear if he underwent any PCI or stenting.  He was doing well until 2013 when he was admitted for chest pain, ruled out for ACS and an exercise stress test was negative for ischemia or prior scar.   He is coming after 2 years. He continues being very active, exercising daily. He has no limitations and no symptoms. He is very complaint with his medicines.   His last echocardiogram showed EF of 55-60% with mild biatrial enlargement. There was no significant valvular heart disease. He was also no pericardial effusion.    Home Medications  Prior to Admission medications   Medication Sig Start Date End Date Taking? Authorizing Provider  acetaminophen  (TYLENOL) 500 MG tablet Take 500 mg by mouth every 6 (six) hours as needed for pain.   Yes Historical Provider, MD  aspirin 325 MG tablet Take 325 mg by mouth daily.   Yes Historical Provider, MD  carvedilol (COREG) 25 MG tablet TAKE ONE TABLET BY MOUTH TWICE DAILY WITH A MEAL 09/13/13  Yes [name], MD  CRESTOR 20 MG tablet TAKE ONE TABLET BY MOUTH AT BEDTIME   Yes [name], MD  furosemide (LASIX) 40 MG tablet Take 0.5 tablets (20 mg total) by mouth daily as needed. For fluid retention 12/03/11  Yes [name], MD  glucose blood test strip Use to test blood sugar twice daily or as directed. Dx 250.40,  583.81 11/26/12  Yes [name], MD  Insulin Aspart Prot & Aspart (NOVOLOG MIX 70/30 FLEXPEN) (70-30) 100 UNIT/ML Pen INJECT 19 UNITS SUBCUTANEOUSLY IN THE MORNING AND 24 UNITS AT NIGHT 09/20/13  Yes [name], MD  levothyroxine (SYNTHROID, LEVOTHROID) 50 MCG tablet Take 1 tablet (50 mcg total) by mouth daily. 11/06/12  Yes [name], MD  lisinopril (PRINIVIL,ZESTRIL) 20 MG tablet Take 1 tablet (20 mg total) by mouth 2 (two) times daily. 11/06/12  Yes [name], MD  Multiple Vitamin (MULTIVITAMIN) tablet Take 1 tablet by mouth daily.  Yes Historical Provider, MD  niacin 500 MG CR capsule Take 2 capsules (1,000 mg total) by mouth at bedtime. 02/03/12  Yes [name], MD  nitroGLYCERIN (NITROSTAT) 0.4 MG SL tablet Place 1 tablet (0.4 mg total) under the tongue every 5 (five) minutes as needed for chest pain. 10/11/11 11/07/14 Yes Rhonda G Barrett, PA-C  zolpidem (AMBIEN) 10 MG tablet TAKE ONE-HALF TABLET BY MOUTH AT BEDTIME   Yes [name], MD    Family History  Family History  Problem Relation Age of Onset  . Coronary artery disease Son 62    5v CABG and stents  . Diabetes Sister   . Stomach cancer Sister 37  . Hyperlipidemia Sister   . Stroke Neg Hx     Social History  History   Social History  . Marital Status: Married    Spouse Name:  N/A    Number of Children: N/A  . Years of Education: N/A   Occupational History  . Not on file.   Social History Main Topics  . Smoking status: Former Smoker    Quit date: 03/04/1978  . Smokeless tobacco: Never Used  . Alcohol Use: No  . Drug Use: No  . Sexual Activity: Not on file   Other Topics Concern  . Not on file   Social History Narrative   Lives with wife, 1 dog   Occupation: retired, was route Hotel manager   Edu: 11th gr   Activity: walks dog 3-4x/day, yardwork, rides bicycle.   Diet: drinks diet coke, no vegetables     Review of Systems, as per HPI, otherwise negative General:  No chills, fever, night sweats or weight changes.  Cardiovascular:  No chest pain, dyspnea on exertion, edema, orthopnea, palpitations, paroxysmal nocturnal dyspnea. Dermatological: No rash, lesions/masses Respiratory: No cough, dyspnea Urologic: No hematuria, dysuria Abdominal:   No nausea, vomiting, diarrhea, bright red blood per rectum, melena, or hematemesis Neurologic:  No visual changes, wkns, changes in mental status. All other systems reviewed and are otherwise negative except as noted above.  Physical Exam  Blood pressure 138/82, pulse 59, height 5\' 11"  (1.803 m), weight 181 lb (82.101 kg).  General: Pleasant, NAD Psych: Normal affect. Neuro: Alert and oriented X 3. Moves all extremities spontaneously. HEENT: Normal  Neck: Supple without bruits or JVD. Lungs:  Resp regular and unlabored, CTA. Heart: RRR no s3, s4, or murmurs. Abdomen: Soft, non-tender, non-distended, BS + x 4.  Extremities: No clubbing, cyanosis or edema. DP/PT/Radials 2+ and equal bilaterally.  Labs:  No results found for this basename: CKTOTAL, CKMB, TROPONINI,  in the last 72 hours Lab Results  Component Value Date   WBC 4.7 06/30/2012   HGB 11.8* 06/30/2012   HCT 35.3* 06/30/2012   MCV 88.4 06/30/2012   PLT 103.0 Repeated and verified X2.* 06/30/2012    No results found for this basename: DDIMER   No  components found with this basename: POCBNP,     Component Value Date/Time   NA 141 03/17/2013 1105   NA 140 09/04/2011   K 4.1 03/17/2013 1105   K 4.4 09/04/2011   CL 108 03/17/2013 1105   CO2 28 03/17/2013 1105   GLUCOSE 93 03/17/2013 1105   BUN 25* 03/17/2013 1105   CREATININE 1.7* 03/17/2013 1105   CREATININE 1.9 09/04/2011   CALCIUM 10.4 03/17/2013 1105   PROT 6.7 09/04/2011   ALBUMIN 4.1 06/30/2012 0936   AST 18 09/04/2011   ALT 23 09/04/2011   ALKPHOS 94 09/04/2011  BILITOT 0.6 09/04/2011   GFRNONAA 26* 10/11/2011 0650   GFRAA 31* 10/11/2011 0650   Lab Results  Component Value Date   CHOL 138 06/30/2012   HDL 49.50 06/30/2012   LDLCALC 69 06/30/2012   TRIG 99.0 06/30/2012    Accessory Clinical Findings  Echocardiogram - 2013 Study Conclusions  - Left ventricle: The cavity size was normal. Wall thickness was normal. The estimated ejection fraction was 55%. Wall motion was normal; there were no regional wall motion abnormalities. Features are consistent with a pseudonormal left ventricular filling pattern, with concomitant abnormal relaxation and increased filling pressure (grade 2 diastolic dysfunction). - Aortic valve: There was no stenosis. - Mitral valve: Trivial regurgitation. - Left atrium: The atrium was mildly dilated. - Right ventricle: The cavity size was normal. Systolic function was normal. - Right atrium: The atrium was mildly dilated. - Tricuspid valve: Peak RV-RA gradient: 55mm Hg (S). - Systemic veins: IVC not visualized. Impressions:  - Normal LV size and systolic function, EF 82%. Moderate diastoilc dysfunction. Normal RV size and systolic function. No significant valvular dysfunction.  ECG - Sinus bradycardia 59 BPM, otherwise normal ECG    Assessment & Plan  1.  CAD, ? Prior intervention, currently asymptomatic on ASA, moderate dose rosuvastatin, carvedilol and lisinopril. Preserved LVEF, negative stress test in 2013. There is no need for further ischemic  evaluation at this time.   2. HTN - well controlled on current regimen  3. Hyperlipidemia - well controlled, lipids will be checked the next wee k at the PCP office  Dorothy Spark, MD, [facility] 10/19/2013, 4:44 PM

## 2013-10-21 ENCOUNTER — Ambulatory Visit (INDEPENDENT_AMBULATORY_CARE_PROVIDER_SITE_OTHER): Payer: Medicare Other | Admitting: Family Medicine

## 2013-10-21 ENCOUNTER — Encounter: Payer: Self-pay | Admitting: Family Medicine

## 2013-10-21 VITALS — BP 116/62 | HR 57 | Temp 97.8°F | Wt 181.5 lb

## 2013-10-21 DIAGNOSIS — E1121 Type 2 diabetes mellitus with diabetic nephropathy: Secondary | ICD-10-CM

## 2013-10-21 DIAGNOSIS — I2510 Atherosclerotic heart disease of native coronary artery without angina pectoris: Secondary | ICD-10-CM

## 2013-10-21 DIAGNOSIS — N058 Unspecified nephritic syndrome with other morphologic changes: Secondary | ICD-10-CM | POA: Diagnosis not present

## 2013-10-21 DIAGNOSIS — E785 Hyperlipidemia, unspecified: Secondary | ICD-10-CM | POA: Diagnosis not present

## 2013-10-21 DIAGNOSIS — E1129 Type 2 diabetes mellitus with other diabetic kidney complication: Secondary | ICD-10-CM

## 2013-10-21 MED ORDER — INSULIN ASPART 100 UNIT/ML FLEXPEN: 5.0000 [IU] | PEN_INJECTOR | Freq: Two times a day (BID) | SUBCUTANEOUS | Status: DC

## 2013-10-21 MED ORDER — INSULIN DETEMIR 100 UNIT/ML FLEXPEN: 20.0000 [IU] | PEN_INJECTOR | Freq: Every day | SUBCUTANEOUS | Status: DC

## 2013-10-21 MED ORDER — ATORVASTATIN CALCIUM 80 MG PO TABS: 80.0000 mg | ORAL_TABLET | Freq: Every day | ORAL | Status: DC

## 2013-10-21 NOTE — Assessment & Plan Note (Signed)
Persistent low sugars despite decreasing novolog 70/30 to 19u bid. Also has high sugars in 200s Will change insulin regimen - to levemir 20 u in am with breakfast (easier to remember) and discussed slow titration to 25u daily Will start novolog aspart at 5 u with largest meal of day, may add onto second largest meal of day if high sugars with this. Pt expressed understanding and agrees with plan  Call me if sugars stay elevated with 25u levemir or if any lows with change. Latest A1c well controlled at 7.7%.

## 2013-10-21 NOTE — Patient Instructions (Addendum)
Stop crestor, start lipitor 80mg  nightly for cholesterol (generic is atorvastatin). Stop novolog 70/30. Start levemir 20 units with breakfast once a day (long acting). May increase by 1 unit every 3 days (to max of 25 units) if average fasting sugar >150. If you're reached 25 units daily, and fasting sugars are still averaging >150 call me or let me know. Start plain novolog 5 units with 1-2 largest meals of the day (for mealtime coverage). Call me if any low sugars persist. Return in 3-4 months for medicare wellness visit.

## 2013-10-21 NOTE — Progress Notes (Signed)
Pre visit review using our clinic review tool, if applicable. No additional management support is needed unless otherwise documented below in the visit note. 

## 2013-10-21 NOTE — Progress Notes (Signed)
BP 116/62  Pulse 57  Temp(Src) 97.8 F (36.6 C) (Oral)  Wt 181 lb 8 oz (82.328 kg)  SpO2 97%   CC: 3 mo f/u  Subjective:    Patient ID: Scott [name], male    DOB: [birth year], 73 y.o.   MRN: [id number]  HPI: Scott [name] is a 73 y.o. male presenting on 10/21/2013 for Follow-up   Saw nephrologist Mercy Moore on Monday - had A1c checked and was 7.7%. Cr 1.62. Tuesday saw Dr. Meda Coffee cardiologist - ok EKG, clear report.   At last visit:  Still with some low sugars with prior regimen.  Discussed change in insulin regimen to basal + mealtime coverage but pt just filled novolog 70/30, concern with finances and donut hole. Will continue current insulin.  Will return to 19-20 u bid of 70/30 to minimize low sugars.  Provided with sugar log to bring filled out to next visit.  DM - regularly does check sugars and brings log today: fasting 130-225, post meal 50-190s.  Does have hypoglycemic awareness. Compliant with antihyperglycemic regimen which includes: novolog 70/30 19u twice daily.  Denies paresthesias. Last diabetic eye exam 08/2013.  Pneumovax: 03/2011.  Prevnar: 07/2013.  Lab Results  Component Value Date   HGBA1C 7.7* 10/16/2013    HLD - compliant with crestor 20mg  daily. Requests alternative 2/2 cost. Will change to lipitor 80mg  daily.  Relevant past medical, surgical, family and social history reviewed and updated as indicated.  Allergies and medications reviewed and updated. Current Outpatient Prescriptions on File Prior to Visit  Medication Sig  . acetaminophen (TYLENOL) 500 MG tablet Take 500 mg by mouth every 6 (six) hours as needed for pain.  Marland Kitchen aspirin 325 MG tablet Take 325 mg by mouth daily.  . carvedilol (COREG) 25 MG tablet TAKE ONE TABLET BY MOUTH TWICE DAILY WITH A MEAL  . furosemide (LASIX) 40 MG tablet Take 0.5 tablets (20 mg total) by mouth daily as needed. For fluid retention  . glucose blood test strip Use to test blood sugar twice daily or as directed. Dx  250.40,  583.81  . levothyroxine (SYNTHROID, LEVOTHROID) 50 MCG tablet Take 1 tablet (50 mcg total) by mouth daily.  Marland Kitchen lisinopril (PRINIVIL,ZESTRIL) 20 MG tablet Take 1 tablet (20 mg total) by mouth 2 (two) times daily.  . Multiple Vitamin (MULTIVITAMIN) tablet Take 1 tablet by mouth daily.  . niacin 500 MG CR capsule Take 2 capsules (1,000 mg total) by mouth at bedtime.  . nitroGLYCERIN (NITROSTAT) 0.4 MG SL tablet Place 1 tablet (0.4 mg total) under the tongue every 5 (five) minutes as needed for chest pain.  Marland Kitchen zolpidem (AMBIEN) 10 MG tablet TAKE ONE-HALF TABLET BY MOUTH AT BEDTIME   No current facility-administered medications on file prior to visit.    Review of Systems Per HPI unless specifically indicated above    Objective:    BP 116/62  Pulse 57  Temp(Src) 97.8 F (36.6 C) (Oral)  Wt 181 lb 8 oz (82.328 kg)  SpO2 97%  Physical Exam  Nursing note and vitals reviewed. Constitutional: He appears well-developed and well-nourished. No distress.  Psychiatric: He has a normal mood and affect.   Results for orders placed in visit on 10/21/13  HEMOGLOBIN A1C      Result Value Ref Range   Hemoglobin A1C 7.7 (*) 4.0 - 6.0 %      Assessment & Plan:   Problem List Items Addressed This Visit   HLD (hyperlipidemia)  Chronic, stable. crestor very expensive - will replace with lipitor 80mg  for same potency.    Relevant Medications      atorvastatin (LIPITOR) tablet   Type 2 diabetes with nephropathy - Primary     Persistent low sugars despite decreasing novolog 70/30 to 19u bid. Also has high sugars in 200s Will change insulin regimen - to levemir 20 u in am with breakfast (easier to remember) and discussed slow titration to 25u daily Will start novolog aspart at 5 u with largest meal of day, may add onto second largest meal of day if high sugars with this. Pt expressed understanding and agrees with plan  Call me if sugars stay elevated with 25u levemir or if any lows with  change. Latest A1c well controlled at 7.7%.    Relevant Medications      atorvastatin (LIPITOR) tablet      Insulin Detemir (LEVEMIR) 100 UNIT/ML Pen      insulin aspart (NOVOLOG FLEXPEN) 100 UNIT/ML FlexPen       Follow up plan: Return in about 3 months (around 01/21/2014), or as needed, for medicare wellness.

## 2013-10-21 NOTE — Assessment & Plan Note (Signed)
Chronic, stable. crestor very expensive - will replace with lipitor 80mg  for same potency.

## 2013-10-25 ENCOUNTER — Encounter (HOSPITAL_COMMUNITY): Payer: Self-pay | Admitting: Emergency Medicine

## 2013-10-25 DIAGNOSIS — I498 Other specified cardiac arrhythmias: Secondary | ICD-10-CM | POA: Diagnosis not present

## 2013-10-25 DIAGNOSIS — D649 Anemia, unspecified: Secondary | ICD-10-CM | POA: Diagnosis not present

## 2013-10-25 DIAGNOSIS — I5030 Unspecified diastolic (congestive) heart failure: Secondary | ICD-10-CM | POA: Insufficient documentation

## 2013-10-25 DIAGNOSIS — E039 Hypothyroidism, unspecified: Secondary | ICD-10-CM | POA: Insufficient documentation

## 2013-10-25 DIAGNOSIS — Z794 Long term (current) use of insulin: Secondary | ICD-10-CM | POA: Insufficient documentation

## 2013-10-25 DIAGNOSIS — N179 Acute kidney failure, unspecified: Secondary | ICD-10-CM | POA: Insufficient documentation

## 2013-10-25 DIAGNOSIS — I129 Hypertensive chronic kidney disease with stage 1 through stage 4 chronic kidney disease, or unspecified chronic kidney disease: Secondary | ICD-10-CM | POA: Insufficient documentation

## 2013-10-25 DIAGNOSIS — R079 Chest pain, unspecified: Principal | ICD-10-CM | POA: Insufficient documentation

## 2013-10-25 DIAGNOSIS — Z7982 Long term (current) use of aspirin: Secondary | ICD-10-CM | POA: Insufficient documentation

## 2013-10-25 DIAGNOSIS — I252 Old myocardial infarction: Secondary | ICD-10-CM | POA: Insufficient documentation

## 2013-10-25 DIAGNOSIS — I509 Heart failure, unspecified: Secondary | ICD-10-CM | POA: Diagnosis not present

## 2013-10-25 DIAGNOSIS — N183 Chronic kidney disease, stage 3 unspecified: Secondary | ICD-10-CM | POA: Diagnosis not present

## 2013-10-25 DIAGNOSIS — E1129 Type 2 diabetes mellitus with other diabetic kidney complication: Secondary | ICD-10-CM | POA: Diagnosis not present

## 2013-10-25 DIAGNOSIS — E785 Hyperlipidemia, unspecified: Secondary | ICD-10-CM | POA: Insufficient documentation

## 2013-10-25 DIAGNOSIS — I2510 Atherosclerotic heart disease of native coronary artery without angina pectoris: Secondary | ICD-10-CM | POA: Diagnosis not present

## 2013-10-25 LAB — BASIC METABOLIC PANEL
ANION GAP: 15 (ref 5–15)
BUN: 32 mg/dL — ABNORMAL HIGH (ref 6–23)
CO2: 23 mEq/L (ref 19–32)
Calcium: 9.6 mg/dL (ref 8.4–10.5)
Chloride: 105 mEq/L (ref 96–112)
Creatinine, Ser: 1.78 mg/dL — ABNORMAL HIGH (ref 0.50–1.35)
GFR calc non Af Amer: 36 mL/min — ABNORMAL LOW (ref >90)
GFR, EST AFRICAN AMERICAN: 42 mL/min — AB (ref >90)
Glucose, Bld: 223 mg/dL — ABNORMAL HIGH (ref 70–99)
Potassium: 5.1 mEq/L (ref 3.7–5.3)
Sodium: 143 mEq/L (ref 137–147)

## 2013-10-25 LAB — CBC
HCT: 32.9 % — ABNORMAL LOW (ref 39.0–52.0)
Hemoglobin: 11 g/dL — ABNORMAL LOW (ref 13.0–17.0)
MCH: 30.5 pg (ref 26.0–34.0)
MCHC: 33.4 g/dL (ref 30.0–36.0)
MCV: 91.1 fL (ref 78.0–100.0)
PLATELETS: 100 10*3/uL — AB (ref 150–400)
RBC: 3.61 MIL/uL — ABNORMAL LOW (ref 4.22–5.81)
RDW: 12.7 % (ref 11.5–15.5)
WBC: 6.2 10*3/uL (ref 4.0–10.5)

## 2013-10-25 LAB — I-STAT TROPONIN, ED: Troponin i, poc: 0 ng/mL (ref 0.00–0.08)

## 2013-10-25 NOTE — ED Notes (Signed)
Patient here with chest pain beginning today. States that his left arm has been hurting for about a week. Today he began to feel burning chest pain in his left chest. States currently the arm feels okay, but he sometimes feels cramping type pain in the tricep of that arm. States recent visit with cardiologist 1 week ago and was said to be okay at that time.

## 2013-10-25 NOTE — ED Notes (Signed)
Patient takes ASA 325 daily and took his dose today.

## 2013-10-26 ENCOUNTER — Emergency Department (HOSPITAL_COMMUNITY): Payer: Medicare Other

## 2013-10-26 ENCOUNTER — Observation Stay (HOSPITAL_COMMUNITY)
Admission: EM | Admit: 2013-10-26 | Discharge: 2013-10-26 | Disposition: A | Discharge status: Home / Self Care | Payer: Medicare Other | Attending: Internal Medicine | Admitting: Internal Medicine

## 2013-10-26 DIAGNOSIS — R079 Chest pain, unspecified: Secondary | ICD-10-CM | POA: Diagnosis not present

## 2013-10-26 DIAGNOSIS — E1129 Type 2 diabetes mellitus with other diabetic kidney complication: Secondary | ICD-10-CM

## 2013-10-26 DIAGNOSIS — I10 Essential (primary) hypertension: Secondary | ICD-10-CM

## 2013-10-26 DIAGNOSIS — I059 Rheumatic mitral valve disease, unspecified: Secondary | ICD-10-CM | POA: Diagnosis not present

## 2013-10-26 DIAGNOSIS — N039 Chronic nephritic syndrome with unspecified morphologic changes: Secondary | ICD-10-CM | POA: Diagnosis not present

## 2013-10-26 DIAGNOSIS — N189 Chronic kidney disease, unspecified: Secondary | ICD-10-CM

## 2013-10-26 DIAGNOSIS — I5033 Acute on chronic diastolic (congestive) heart failure: Secondary | ICD-10-CM

## 2013-10-26 DIAGNOSIS — D631 Anemia in chronic kidney disease: Secondary | ICD-10-CM

## 2013-10-26 DIAGNOSIS — N058 Unspecified nephritic syndrome with other morphologic changes: Secondary | ICD-10-CM

## 2013-10-26 DIAGNOSIS — N183 Chronic kidney disease, stage 3 unspecified: Secondary | ICD-10-CM

## 2013-10-26 DIAGNOSIS — I509 Heart failure, unspecified: Secondary | ICD-10-CM

## 2013-10-26 DIAGNOSIS — D696 Thrombocytopenia, unspecified: Secondary | ICD-10-CM

## 2013-10-26 DIAGNOSIS — Z0000 Encounter for general adult medical examination without abnormal findings: Secondary | ICD-10-CM

## 2013-10-26 DIAGNOSIS — E039 Hypothyroidism, unspecified: Secondary | ICD-10-CM

## 2013-10-26 DIAGNOSIS — E785 Hyperlipidemia, unspecified: Secondary | ICD-10-CM

## 2013-10-26 DIAGNOSIS — E1121 Type 2 diabetes mellitus with diabetic nephropathy: Secondary | ICD-10-CM

## 2013-10-26 LAB — CBC
HEMATOCRIT: 31.6 % — AB (ref 39.0–52.0)
HEMOGLOBIN: 10.2 g/dL — AB (ref 13.0–17.0)
MCH: 29.7 pg (ref 26.0–34.0)
MCHC: 32.3 g/dL (ref 30.0–36.0)
MCV: 91.9 fL (ref 78.0–100.0)
Platelets: 83 10*3/uL — ABNORMAL LOW (ref 150–400)
RBC: 3.44 MIL/uL — ABNORMAL LOW (ref 4.22–5.81)
RDW: 12.8 % (ref 11.5–15.5)
WBC: 6 10*3/uL (ref 4.0–10.5)

## 2013-10-26 LAB — CREATININE, SERUM
Creatinine, Ser: 1.64 mg/dL — ABNORMAL HIGH (ref 0.50–1.35)
GFR calc Af Amer: 46 mL/min — ABNORMAL LOW (ref >90)
GFR calc non Af Amer: 40 mL/min — ABNORMAL LOW (ref >90)

## 2013-10-26 LAB — TROPONIN I
Troponin I: <0.3 ng/mL (ref <0.30)
Troponin I: <0.3 ng/mL (ref <0.30)
Troponin I: <0.3 ng/mL (ref <0.30)

## 2013-10-26 LAB — GLUCOSE, CAPILLARY
Glucose-Capillary: 181 mg/dL — ABNORMAL HIGH (ref 70–99)
Glucose-Capillary: 262 mg/dL — ABNORMAL HIGH (ref 70–99)

## 2013-10-26 LAB — HEMOGLOBIN A1C
HEMOGLOBIN A1C: 7.7 % — AB (ref <5.7)
MEAN PLASMA GLUCOSE: 174 mg/dL — AB (ref <117)

## 2013-10-26 LAB — PRO B NATRIURETIC PEPTIDE: PRO B NATRI PEPTIDE: 1542 pg/mL — AB (ref 0–125)

## 2013-10-26 MED ORDER — ONDANSETRON HCL 4 MG PO TABS: 4.0000 mg | ORAL_TABLET | Freq: Four times a day (QID) | ORAL | Status: DC | PRN

## 2013-10-26 MED ORDER — LEVOTHYROXINE SODIUM 50 MCG PO TABS
50.0000 ug | ORAL_TABLET | Freq: Every day | ORAL | Status: DC
  Administered 2013-10-26: 50 ug via ORAL
  Filled 2013-10-26: qty 1

## 2013-10-26 MED ORDER — LISINOPRIL 20 MG PO TABS
20.0000 mg | ORAL_TABLET | Freq: Two times a day (BID) | ORAL | Status: DC
  Administered 2013-10-26: 20 mg via ORAL
  Filled 2013-10-26 (×3): qty 1

## 2013-10-26 MED ORDER — ASPIRIN 325 MG PO TABS
325.0000 mg | ORAL_TABLET | Freq: Every day | ORAL | Status: DC
  Administered 2013-10-26: 325 mg via ORAL
  Filled 2013-10-26: qty 1

## 2013-10-26 MED ORDER — FUROSEMIDE 10 MG/ML IJ SOLN
20.0000 mg | Freq: Every day | INTRAMUSCULAR | Status: DC
  Administered 2013-10-26: 20 mg via INTRAVENOUS
  Filled 2013-10-26: qty 2

## 2013-10-26 MED ORDER — INSULIN ASPART 100 UNIT/ML ~~LOC~~ SOLN
0.0000 [IU] | Freq: Three times a day (TID) | SUBCUTANEOUS | Status: DC
  Administered 2013-10-26: 5 [IU] via SUBCUTANEOUS
  Administered 2013-10-26: 2 [IU] via SUBCUTANEOUS

## 2013-10-26 MED ORDER — ONDANSETRON HCL 4 MG/2ML IJ SOLN: 4.0000 mg | Freq: Four times a day (QID) | INTRAMUSCULAR | Status: DC | PRN

## 2013-10-26 MED ORDER — ACETAMINOPHEN 325 MG PO TABS: 650.0000 mg | ORAL_TABLET | ORAL | Status: DC | PRN

## 2013-10-26 MED ORDER — GI COCKTAIL ~~LOC~~
30.0000 mL | Freq: Four times a day (QID) | ORAL | Status: DC | PRN
  Filled 2013-10-26: qty 30

## 2013-10-26 MED ORDER — HEPARIN SODIUM (PORCINE) 5000 UNIT/ML IJ SOLN: 5000.0000 [IU] | Freq: Three times a day (TID) | INTRAMUSCULAR | Status: DC

## 2013-10-26 MED ORDER — ATORVASTATIN CALCIUM 80 MG PO TABS
80.0000 mg | ORAL_TABLET | Freq: Every day | ORAL | Status: DC
  Administered 2013-10-26: 80 mg via ORAL
  Filled 2013-10-26: qty 1

## 2013-10-26 NOTE — Progress Notes (Signed)
  Echocardiogram 2D Echocardiogram has been performed.  Scott [name] M 10/26/2013, 3:24 PM

## 2013-10-26 NOTE — H&P (Signed)
[facility] History and Physical  Scott [name] IWL:[id number] DOB: May 30, 1940 DOA: 10/26/2013  Referring physician: Dr Claudine Mouton, [place] PCP: [name], MD  Specialists: none  Chief Complaint: CHest pain Assessment/Plan Active Problems:   * No active hospital problems. * Type II DM w/ nephropathy HTN CKD III CAD s/p MI Anemia HLD dCHF  CP: cardiac vs reflux vs MSK. HEART Score 5. POC trop neg. EKG w/o sign of ischemia.  - Admit for CP r/o - Obs - Tele - Cycle troponins (0/3) - GI cocktail - continue home ASA - Cont Statin  HTN: hypertensive on admission.  - Continue home Coreg, Lisinopril - monitor and change as necessary  dCHF: ?? Exacerbation as w/ CKD and mild symptoms. Last Echo BNP 1542, 1+ LE edema. Uses lasix prn for LE edema. Recent SOB w/ CP episode. Las Echo 10/2011 w/ Grade II diastolic and 17% EF. - Lasix 20 IV Qday - +/- 2D Echo if worsens - Strict I/O - Daily wts  CKD: Cr 1.78 on admission. Baseline around 2.2 in past.  - monitor as will likely increase w/ diuresis  Anemia: 11 on admission. Near baseline. Likely of chronic disease - monitor  Hypothyroid: continue synthroid  DVT Prophylaxis: Hep Smock TID   Code Status: FULL Family Communication: Family not present for pt encounter Disposition Plan: pending workup  HPI: Scott [name] is a 73 y.o. male came [facility] ed 10/26/2013 with  CP. Left sided. BUrining sensation w/ radiation to L arm. H/o previous MI and pt states that this feels just like others. Nitro w/ relief on 8/23 but no relief today. Pain comes and goes and is not necessarily associated w/ exertion. Todays event lasted 10 min. Associated w/ diaphoresis, but denies nausea and SOB. Denies fevers, rash, dysuria, abd pain.  Endorses recent increase in physical exercise around the home w/ brush clearing Also reports heart burn w/ orange juice which he's had to drink more regluarly due to episodes of hypoglycemia   Review of Systems: Per  HPI w/ all other systems negative.   Past Medical History  Diagnosis Date  . Type 2 diabetes with nephropathy     DM refresher course [facility] (04/2013)  . Hypertension   . CKD (chronic kidney disease) stage 3, GFR 30-59 ml/min     [name]  . CAD (coronary artery disease) 10/10/2011    MI s/p PTCA (Dx-OM2 proximal concentric stenosis)  . Anemia 10/11/2011  . Bradycardia 10/10/2011  . Hypothyroidism 10/10/2011  . Thrombocytopenia 10/11/2011  . HLD (hyperlipidemia)   . Arthritis   . Generalized headaches     frequent  . Glaucoma     s/p laser surgery  . Colon polyps   . Acquired hand deformity 1962    hand saw accident at work   Past Surgical History  Procedure Laterality Date  . Back surgery      cervical neck  . [place]  . Colonoscopy  11/2008    1 polyp, diverticulosis, rec rpt 5 yrs (Dr. Oletta Lamas, [name])  . Eye surgery  2012    laser surgery for glaucoma   Social History:  History   Social History Narrative   Lives with wife, 1 dog   Occupation: retired, was route Hotel manager   Edu: 11th gr   Activity: walks dog 3-4x/day, yardwork, rides bicycle.   Diet: drinks diet coke, no vegetables    No Known Allergies  Family History  Problem Relation Age of Onset  . Coronary  artery disease Son 78    5v CABG and stents  . Diabetes Sister   . Stomach cancer Sister 38  . Hyperlipidemia Sister   . Stroke Neg Hx     Prior to Admission medications   Medication Sig Start Date End Date Taking? Authorizing Provider  acetaminophen (TYLENOL) 500 MG tablet Take 1,000 mg by mouth every 6 (six) hours as needed for mild pain.    Yes Historical Provider, MD  aspirin 325 MG tablet Take 325 mg by mouth daily.   Yes Historical Provider, MD  atorvastatin (LIPITOR) 80 MG tablet Take 1 tablet (80 mg total) by mouth daily. 10/21/13  Yes [name], MD  carvedilol (COREG) 25 MG tablet Take 25 mg by mouth 2 (two) times daily with a meal.   Yes Historical Provider, MD  furosemide (LASIX) 40  MG tablet Take 0.5 tablets (20 mg total) by mouth daily as needed. For fluid retention 12/03/11  Yes [name], MD  insulin aspart (NOVOLOG FLEXPEN) 100 UNIT/ML FlexPen Inject 5 Units into the skin See admin instructions. When eating a big meal for dinner   Yes Historical Provider, MD  Insulin Detemir (LEVEMIR FLEXTOUCH) 100 UNIT/ML Pen Inject 20 Units into the skin every morning.   Yes Historical Provider, MD  levothyroxine (SYNTHROID, LEVOTHROID) 50 MCG tablet Take 1 tablet (50 mcg total) by mouth daily. 11/06/12  Yes [name], MD  lisinopril (PRINIVIL,ZESTRIL) 20 MG tablet Take 1 tablet (20 mg total) by mouth 2 (two) times daily. 11/06/12  Yes [name], MD  Multiple Vitamin (MULTIVITAMIN) tablet Take 1 tablet by mouth daily.   Yes Historical Provider, MD  niacin 500 MG CR capsule Take 2 capsules (1,000 mg total) by mouth at bedtime. 02/03/12  Yes [name], MD  nitroGLYCERIN (NITROSTAT) 0.4 MG SL tablet Place 1 tablet (0.4 mg total) under the tongue every 5 (five) minutes as needed for chest pain. 10/11/11 11/07/14 Yes Rhonda G Barrett, PA-C  zolpidem (AMBIEN) 10 MG tablet Take 5 mg by mouth at bedtime.   Yes Historical Provider, MD   Physical Exam: Filed Vitals:   10/25/13 2205 10/26/13 0200 10/26/13 0215  BP: 195/65 188/73 179/71  Pulse: 56 51 57  Temp: 98.2 F (36.8 C)    TempSrc: Oral    Resp: 20 18 16   SpO2: 97% 98% 99%     General:  NAD, WNWD  Eyes: EOMI, PERRL  ENT: mmm  Neck: soft, ROM nml  Cardiovascular: RRR, II/VI systolic murmur  Respiratory: CTAB, nml effort  Abdomen: soft, nonttp  Skin: intact, diffuse ecchymosis   Musculoskeletal: no effusions, CP non reproducible on palpation.   Psychiatric: nml affect  Neurologic: CN2-12 grossly intact.   Labs on Admission:  Basic Metabolic Panel:  Recent Labs Lab 10/25/13 2209  NA 143  K 5.1  CL 105  CO2 23  GLUCOSE 223*  BUN 32*  CREATININE 1.78*  CALCIUM 9.6   Liver Function  Tests: No results found for this basename: AST, ALT, ALKPHOS, BILITOT, PROT, ALBUMIN,  in the last 168 hours No results found for this basename: LIPASE, AMYLASE,  in the last 168 hours No results found for this basename: AMMONIA,  in the last 168 hours CBC:  Recent Labs Lab 10/25/13 2209  WBC 6.2  HGB 11.0*  HCT 32.9*  MCV 91.1  PLT 100*   Cardiac Enzymes: No results found for this basename: CKTOTAL, CKMB, CKMBINDEX, TROPONINI,  in the last 168 hours  BNP (last 3 results) No  results found for this basename: PROBNP,  in the last 8760 hours CBG: No results found for this basename: GLUCAP,  in the last 168 hours  Radiological Exams on Admission: No results found.     Time spent: greater than 70 min in direct pt care and cordination   MERRELL, DAVID J, MD [facility] [email or web address] Password [email or web address] 10/26/2013, 3:06 AM

## 2013-10-26 NOTE — ED Provider Notes (Signed)
CSN: [id number]     Arrival date & time 10/25/13  2155 History   First MD Initiated Contact with Patient 10/26/13 0203     Chief Complaint  Patient presents with  . Chest Pain     (Consider location/radiation/quality/duration/timing/severity/associated sxs/prior Treatment) HPI Scott [name] is a 73 year old male with a past medical history of AMI and CAD coming in with left-sided chest pain. Patient describes the pain as a burning sensation that radiates to his left arm. He states his left arm feels as if it sprained. This is exactly how his prior AMIs have felt.  On Sunday patient stated his symptoms resolved with nitroglycerin. However today when it reoccurred today and nitroglycerin did not help. The pain comes intermittently at random times. It is not exertional. It lasted approximately 10 minutes before spontaneously resolving. He has associated diaphoresis, but denies shortness of breath or emesis. Patient denies fevers recent infections abdominal pain changes in his stool or urine.  Past Medical History  Diagnosis Date  . Type 2 diabetes with nephropathy     DM refresher course [facility] (04/2013)  . Hypertension   . CKD (chronic kidney disease) stage 3, GFR 30-59 ml/min     [name]  . CAD (coronary artery disease) 10/10/2011    MI s/p PTCA (Dx-OM2 proximal concentric stenosis)  . Anemia 10/11/2011  . Bradycardia 10/10/2011  . Hypothyroidism 10/10/2011  . Thrombocytopenia 10/11/2011  . HLD (hyperlipidemia)   . Arthritis   . Generalized headaches     frequent  . Glaucoma     s/p laser surgery  . Colon polyps   . Acquired hand deformity 1962    hand saw accident at work   Past Surgical History  Procedure Laterality Date  . Back surgery      cervical neck  . [place]  . Colonoscopy  11/2008    1 polyp, diverticulosis, rec rpt 5 yrs (Dr. Oletta Lamas, [name])  . Eye surgery  2012    laser surgery for glaucoma   Family History  Problem Relation Age of Onset  . Coronary artery  disease Son 75    5v CABG and stents  . Diabetes Sister   . Stomach cancer Sister 77  . Hyperlipidemia Sister   . Stroke Neg Hx    History  Substance Use Topics  . Smoking status: Former Smoker    Quit date: 03/04/1978  . Smokeless tobacco: Never Used  . Alcohol Use: No    Review of Systems 10 Systems reviewed and are negative for acute change except as noted in the HPI.    Allergies  Review of patient's allergies indicates no known allergies.  Home Medications   Prior to Admission medications   Medication Sig Start Date End Date Taking? Authorizing Provider  acetaminophen (TYLENOL) 500 MG tablet Take 1,000 mg by mouth every 6 (six) hours as needed for mild pain.    Yes Historical Provider, MD  aspirin 325 MG tablet Take 325 mg by mouth daily.   Yes Historical Provider, MD  atorvastatin (LIPITOR) 80 MG tablet Take 1 tablet (80 mg total) by mouth daily. 10/21/13  Yes [name], MD  carvedilol (COREG) 25 MG tablet Take 25 mg by mouth 2 (two) times daily with a meal.   Yes Historical Provider, MD  furosemide (LASIX) 40 MG tablet Take 0.5 tablets (20 mg total) by mouth daily as needed. For fluid retention 12/03/11  Yes [name], MD  insulin aspart (NOVOLOG FLEXPEN) 100 UNIT/ML FlexPen Inject  5 Units into the skin See admin instructions. When eating a big meal for dinner   Yes Historical Provider, MD  Insulin Detemir (LEVEMIR FLEXTOUCH) 100 UNIT/ML Pen Inject 20 Units into the skin every morning.   Yes Historical Provider, MD  levothyroxine (SYNTHROID, LEVOTHROID) 50 MCG tablet Take 1 tablet (50 mcg total) by mouth daily. 11/06/12  Yes [name], MD  lisinopril (PRINIVIL,ZESTRIL) 20 MG tablet Take 1 tablet (20 mg total) by mouth 2 (two) times daily. 11/06/12  Yes [name], MD  Multiple Vitamin (MULTIVITAMIN) tablet Take 1 tablet by mouth daily.   Yes Historical Provider, MD  niacin 500 MG CR capsule Take 2 capsules (1,000 mg total) by mouth at bedtime. 02/03/12   Yes [name], MD  nitroGLYCERIN (NITROSTAT) 0.4 MG SL tablet Place 1 tablet (0.4 mg total) under the tongue every 5 (five) minutes as needed for chest pain. 10/11/11 11/07/14 Yes Rhonda G Barrett, PA-C  zolpidem (AMBIEN) 10 MG tablet Take 5 mg by mouth at bedtime.   Yes Historical Provider, MD   BP 179/71  Pulse 57  Temp(Src) 98.2 F (36.8 C) (Oral)  Resp 16  SpO2 99% Physical Exam  Nursing note and vitals reviewed. Constitutional: He is oriented to person, place, and time. Vital signs are normal. He appears well-developed and well-nourished.  Non-toxic appearance. He does not appear ill. No distress.  HENT:  Head: Normocephalic and atraumatic.  Nose: Nose normal.  Mouth/Throat: Oropharynx is clear and moist. No oropharyngeal exudate.  Eyes: Conjunctivae and EOM are normal. Pupils are equal, round, and reactive to light. No scleral icterus.  Neck: Normal range of motion. Neck supple. No tracheal deviation, no edema, no erythema and normal range of motion present. No mass and no thyromegaly present.  Cardiovascular: Normal rate, regular rhythm, S1 normal, S2 normal, normal heart sounds, intact distal pulses and normal pulses.  Exam reveals no gallop and no friction rub.   No murmur heard. Pulses:      Radial pulses are 2+ on the right side, and 2+ on the left side.       Dorsalis pedis pulses are 2+ on the right side, and 2+ on the left side.  Pulmonary/Chest: Effort normal and breath sounds normal. No respiratory distress. He has no wheezes. He has no rhonchi. He has no rales.  Abdominal: Soft. Normal appearance and bowel sounds are normal. He exhibits no distension, no ascites and no mass. There is no hepatosplenomegaly. There is no tenderness. There is no rebound, no guarding and no CVA tenderness.  Erythematous indurated area in the left lower quadrant where insulin injections are given.  Musculoskeletal: Normal range of motion. He exhibits no edema and no tenderness.  Right upper  extremity is amputated digits from accident when the patient was 73 years old.  Lymphadenopathy:    He has no cervical adenopathy.  Neurological: He is alert and oriented to person, place, and time. He has normal strength. No cranial nerve deficit or sensory deficit. GCS eye subscore is 4. GCS verbal subscore is 5. GCS motor subscore is 6.  Skin: Skin is warm, dry and intact. No petechiae and no rash noted. He is not diaphoretic. No erythema. No pallor.  Psychiatric: He has a normal mood and affect. His behavior is normal. Judgment normal.    ED Course  Procedures (including critical care time) Labs Review Labs Reviewed  CBC - Abnormal; Notable for the following:    RBC 3.61 (*)    Hemoglobin 11.0 (*)  HCT 32.9 (*)    Platelets 100 (*)    All other components within normal limits  BASIC METABOLIC PANEL - Abnormal; Notable for the following:    Glucose, Bld 223 (*)    BUN 32 (*)    Creatinine, Ser 1.78 (*)    GFR calc non Af Amer 36 (*)    GFR calc Af Amer 42 (*)    All other components within normal limits  PRO B NATRIURETIC PEPTIDE - Abnormal; Notable for the following:    Pro B Natriuretic peptide (BNP) 1542.0 (*)    All other components within normal limits  I-STAT TROPOININ, ED    Imaging Review Dg Chest 2 View  10/26/2013   CLINICAL DATA:  Left chest pain and pain down the left arm.  EXAM: CHEST  2 VIEW  COMPARISON:  10/09/2011  FINDINGS: The heart size and mediastinal contours are within normal limits. Both lungs are clear. The visualized skeletal structures are unremarkable.  IMPRESSION: No active cardiopulmonary disease.   Electronically Signed   By: Lucienne Capers M.D.   On: 10/26/2013 03:13     EKG Interpretation None    MUSE is not working, EKG=NSR, rate 60, LAD, no sig ST/T wave changes.  MDM   Final diagnoses:  Chest pain, unspecified chest pain type   Patient presents with a concerning story for ACS. He states this pain is similar to his prior AMI. If  resolved with nitroglycerin yesterday but did not resolve with nitroglycerin today. Patient states he came to the emergency department because the pain persisted and was severe. Patient will be retained in the hospital for workup for his chest pain. Patient's blood pressure has been elevated in the emergency department, she states at normal for him despite taking his medications.  He continues to be asymptomatic and is denying any headache or chest pain currently.  Everlene Balls, MD 10/26/13 [phone]

## 2013-10-26 NOTE — Discharge Instructions (Signed)
STOP taking coreg until you see your doctor We have stopped your coreg as your heart rate was low in 40's and has stabilized to 50's off the medication    Bradycardia Bradycardia is a term for a heart rate (pulse) that, in adults, is slower than 60 beats per minute. A normal rate is 60 to 100 beats per minute. A heart rate below 60 beats per minute may be normal for some adults with healthy hearts. If the rate is too slow, the heart may have trouble pumping the volume of blood the body needs. If the heart rate gets too low, blood flow to the brain may be decreased and may make you feel lightheaded, dizzy, or faint. The heart has a natural pacemaker in the top of the heart called the SA node (sinoatrial or sinus node). This pacemaker sends out regular electrical signals to the muscle of the heart, telling the heart muscle when to beat (contract). The electrical signal travels from the upper parts of the heart (atria) through the AV node (atrioventricular node), to the lower chambers of the heart (ventricles). The ventricles squeeze, pumping the blood from your heart to your lungs and to the rest of your body. CAUSES   Problem with the heart's electrical system.  Problem with the heart's natural pacemaker.  Heart disease, damage, or infection.  Medications.  Problems with minerals and salts (electrolytes). SYMPTOMS   Fainting (syncope).  Fatigue and weakness.  Shortness of breath (dyspnea).  Chest pain (angina).  Drowsiness.  Confusion. DIAGNOSIS   An electrocardiogram (ECG) can help your caregiver determine the type of slow heart rate you have.  If the cause is not seen on an ECG, you may need to wear a heart monitor that records your heart rhythm for several hours or days.  Blood tests. TREATMENT   Electrolyte supplements.  Medications.  Withholding medication which is causing a slow heart rate.  Pacemaker placement. SEEK IMMEDIATE MEDICAL CARE IF:   You feel  lightheaded or faint.  You develop an irregular heart rate.  You feel chest pain or have trouble breathing. MAKE SURE YOU:   Understand these instructions.  Will watch your condition.  Will get help right away if you are not doing well or get worse. Document Released: 11/10/2001 Document Revised: 05/13/2011 Document Reviewed: 05/26/2013 [facility] Patient Information 2015 [place], [place]. This information is not intended to replace advice given to you by your health care provider. Make sure you discuss any questions you have with your health care provider.

## 2013-10-26 NOTE — Progress Notes (Signed)
UR completed 

## 2013-10-26 NOTE — Discharge Summary (Signed)
Physician Discharge Summary  Scott [name] CBJ:[id number] DOB: [birth year] DOA: 10/26/2013  PCP: [name], MD  Admit date: 10/26/2013 Discharge date: 10/26/2013  Recommendations for Outpatient Follow-up:  1. Pt will need to follow up with PCP in 2-3 weeks post discharge 2. Please obtain BMP to evaluate electrolytes and kidney function 3. Please also check CBC to evaluate Hg and Hct levels 4. Stop taking coreg until seen by PCP due to bradycardia with heart rate in 40's  Discharge Diagnoses:  Active Problems:   Chest pain    Discharge Condition: Stable  Diet recommendation: Heart healthy diet discussed in details   History of present illness:  73 y.o. male came [facility] ed 10/26/2013 with left sided chest pain radiating to the left arm. Resolved at the time of the admission. Nitro given in ED and pt explains it help with pain. Denies nausea and SOB. Denies fevers, rash, dysuria, abd pain. Endorses recent increase in physical exercise around the home w/ brush clearing Also reports heart burn w/ orange juice which he's had to drink more regluarly due to episodes of hypoglycemia   Hospital Course:  Active Problems:   Chest pain - now resolved - pt wants to go home - he say she has an appointment with cardiologist and PCP   Acute on chronic renal failure - will continue home medical regimen for now - pt made aware to talk to PCP about continuing Lisinopril    Bradycardia - with HR on 30-40's - will stop Coreg and pt made aware    Procedures/Studies: Dg Chest 2 View  10/26/2013   CLINICAL DATA:  Left chest pain and pain down the left arm.  EXAM: CHEST  2 VIEW  COMPARISON:  10/09/2011  FINDINGS: The heart size and mediastinal contours are within normal limits. Both lungs are clear. The visualized skeletal structures are unremarkable.  IMPRESSION: No active cardiopulmonary disease.   Electronically Signed   By: Lucienne Capers M.D.   On: 10/26/2013 03:13    Consultations:  None  Antibiotics:  None  Discharge Exam: Filed Vitals:   10/26/13 0759  BP: 171/73  Pulse: 51  Temp: 97.9 F (36.6 C)  Resp: 18   Filed Vitals:   10/26/13 0530 10/26/13 0600 10/26/13 0630 10/26/13 0759  BP: 165/73 179/72 169/70 171/73  Pulse: 63 49 45 51  Temp:    97.9 F (36.6 C)  TempSrc:    Oral  Resp: 18 16 13 18   Weight:    82.4 kg (181 lb 10.5 oz)  SpO2: 97% 97% 94% 93%    General: Pt is alert, follows commands appropriately, not in acute distress Cardiovascular: Regular rhythm, bradycardia, no rubs, no gallops Respiratory: Clear to auscultation bilaterally, no wheezing, no crackles, no rhonchi Abdominal: Soft, non tender, non distended, bowel sounds +, no guarding Extremities: no edema, no cyanosis, pulses palpable bilaterally DP and PT Neuro: Grossly nonfocal  Discharge Instructions  Discharge Instructions   Diet - low sodium heart healthy    Complete by:  As directed      Increase activity slowly    Complete by:  As directed             Medication List    STOP taking these medications       carvedilol 25 MG tablet  Commonly known as:  COREG      TAKE these medications       acetaminophen 500 MG tablet  Commonly known as:  TYLENOL  Take 1,000  mg by mouth every 6 (six) hours as needed for mild pain.     aspirin 325 MG tablet  Take 325 mg by mouth daily.     atorvastatin 80 MG tablet  Commonly known as:  LIPITOR  Take 1 tablet (80 mg total) by mouth daily.     furosemide 40 MG tablet  Commonly known as:  LASIX  Take 0.5 tablets (20 mg total) by mouth daily as needed. For fluid retention     LEVEMIR FLEXTOUCH 100 UNIT/ML Pen  Generic drug:  Insulin Detemir  Inject 20 Units into the skin every morning.     levothyroxine 50 MCG tablet  Commonly known as:  SYNTHROID, LEVOTHROID  Take 1 tablet (50 mcg total) by mouth daily.     lisinopril 20 MG tablet  Commonly known as:  PRINIVIL,ZESTRIL  Take 1 tablet (20 mg  total) by mouth 2 (two) times daily.     multivitamin tablet  Take 1 tablet by mouth daily.     niacin 500 MG CR capsule  Take 2 capsules (1,000 mg total) by mouth at bedtime.     nitroGLYCERIN 0.4 MG SL tablet  Commonly known as:  NITROSTAT  Place 1 tablet (0.4 mg total) under the tongue every 5 (five) minutes as needed for chest pain.     NOVOLOG FLEXPEN 100 UNIT/ML FlexPen  Generic drug:  insulin aspart  Inject 5 Units into the skin See admin instructions. When eating a big meal for dinner     zolpidem 10 MG tablet  Commonly known as:  AMBIEN  Take 5 mg by mouth at bedtime.           Follow-up Information   Schedule an appointment as soon as possible for a visit with [name], MD.   Specialty:  [facility]   Contact information:   [place] [address] [phone]        The results of significant diagnostics from this hospitalization (including imaging, microbiology, ancillary and laboratory) are listed below for reference.     Microbiology: No results found for this or any previous visit (from the past 240 hour(s)).   Labs: Basic Metabolic Panel:  Recent Labs Lab 10/25/13 2209 10/26/13 0703  NA 143  --   K 5.1  --   CL 105  --   CO2 23  --   GLUCOSE 223*  --   BUN 32*  --   CREATININE 1.78* 1.64*  CALCIUM 9.6  --    Liver Function Tests: No results found for this basename: AST, ALT, ALKPHOS, BILITOT, PROT, ALBUMIN,  in the last 168 hours No results found for this basename: LIPASE, AMYLASE,  in the last 168 hours No results found for this basename: AMMONIA,  in the last 168 hours CBC:  Recent Labs Lab 10/25/13 2209 10/26/13 0703  WBC 6.2 6.0  HGB 11.0* 10.2*  HCT 32.9* 31.6*  MCV 91.1 91.9  PLT 100* 83*   Cardiac Enzymes:  Recent Labs Lab 10/26/13 0703 10/26/13 0842  TROPONINI <0.30 <0.30   BNP: BNP (last 3 results)  Recent Labs  10/25/13 2209  PROBNP 1542.0*   CBG:  Recent Labs Lab  10/26/13 0832  GLUCAP 181*     SIGNED: Time coordinating discharge: Over 30 minutes  Faye Ramsay, MD  [facility] 10/26/2013, 11:26 AM Pager [phone]  If 7PM-7AM, please contact night-coverage [email or web address] Password [email or web address]

## 2013-10-26 NOTE — ED Notes (Signed)
Spoke with lab, they will add on BNP

## 2013-10-26 NOTE — ED Notes (Signed)
Attempted report 

## 2013-10-26 NOTE — Progress Notes (Signed)
Inpatient Diabetes Program Recommendations  AACE/ADA: New Consensus Statement on Inpatient Glycemic Control (2013)  Target Ranges:  Prepandial:   less than 140 mg/dL      Peak postprandial:   less than 180 mg/dL (1-2 hours)      Critically ill patients:  140 - 180 mg/dL   Results for QUINLAN, VOLLMER (MRN [id number]) as of 10/26/2013 12:43  Ref. Range 10/11/2011 11:37 10/26/2013 08:32 10/26/2013 11:14  Glucose-Capillary Latest Range: 70-99 mg/dL 182 (H) 181 (H) 262 (H)   Diabetes history: DM2 Outpatient Diabetes medications: Levemir 20 units QAM, Novolog 5 units daily (with largest meal) Current orders for Inpatient glycemic control: Novolog 0-9 units AC  Inpatient Diabetes Program Recommendations Insulin - Basal: Please consider ordering Levemir 10 units Q24H (starting now).  Note: Noted in reviewing the chart that patient was on 70/30 19 units BID and was recently changed to "Levemir 20 units QAM (titrate up to 25 units) and Novolog 5 units daily with largest meal, may add onto second largest meal of day if high sugars with this" per Dr. Danise Mina office note. Currently patient is not ordered any basal insulin. Please consider ordering Levemir 10 units Q24H.   Thanks, Barnie Alderman, RN, MSN, CCRN Diabetes Coordinator Inpatient Diabetes Program [phone] (Team Pager) [phone] ([facility] office) [phone] [facility] office)

## 2013-10-27 ENCOUNTER — Encounter: Payer: Self-pay | Admitting: Family Medicine

## 2013-10-29 ENCOUNTER — Ambulatory Visit (INDEPENDENT_AMBULATORY_CARE_PROVIDER_SITE_OTHER): Payer: Medicare Other | Admitting: Family Medicine

## 2013-10-29 ENCOUNTER — Encounter: Payer: Self-pay | Admitting: Family Medicine

## 2013-10-29 VITALS — BP 136/74 | HR 56 | Temp 97.5°F | Wt 176.5 lb

## 2013-10-29 DIAGNOSIS — N183 Chronic kidney disease, stage 3 unspecified: Secondary | ICD-10-CM

## 2013-10-29 DIAGNOSIS — E1129 Type 2 diabetes mellitus with other diabetic kidney complication: Secondary | ICD-10-CM

## 2013-10-29 DIAGNOSIS — N189 Chronic kidney disease, unspecified: Secondary | ICD-10-CM

## 2013-10-29 DIAGNOSIS — D631 Anemia in chronic kidney disease: Secondary | ICD-10-CM

## 2013-10-29 DIAGNOSIS — I10 Essential (primary) hypertension: Secondary | ICD-10-CM | POA: Diagnosis not present

## 2013-10-29 DIAGNOSIS — N058 Unspecified nephritic syndrome with other morphologic changes: Secondary | ICD-10-CM

## 2013-10-29 DIAGNOSIS — R079 Chest pain, unspecified: Secondary | ICD-10-CM

## 2013-10-29 DIAGNOSIS — I2510 Atherosclerotic heart disease of native coronary artery without angina pectoris: Secondary | ICD-10-CM | POA: Diagnosis not present

## 2013-10-29 DIAGNOSIS — N039 Chronic nephritic syndrome with unspecified morphologic changes: Secondary | ICD-10-CM

## 2013-10-29 DIAGNOSIS — E1121 Type 2 diabetes mellitus with diabetic nephropathy: Secondary | ICD-10-CM

## 2013-10-29 MED ORDER — ZOLPIDEM TARTRATE 10 MG PO TABS: 5.0000 mg | ORAL_TABLET | Freq: Every day | ORAL | Status: DC

## 2013-10-29 NOTE — Assessment & Plan Note (Signed)
Recent hospital w/u ok - cycled cardiac enzymes, EKG but echo showed new inferior wall motion abnormality. Suggested f/u with cardiology for further evaluation of this new finding. Pt now off coreg, only on lisinopril 20mg  bid, statin, lasix 20mg  prn swelling, and aspirin 325mg  daily.

## 2013-10-29 NOTE — Progress Notes (Signed)
Pre visit review using our clinic review tool, if applicable. No additional management support is needed unless otherwise documented below in the visit note. 

## 2013-10-29 NOTE — Patient Instructions (Addendum)
Continue slow titration of levemir up to 25 units daily as long as no low sugars. Decrease novolog to 3 units with supper. Schedule follow up appointment with Dr. Meda Coffee. Hold coreg for now. Goal blood pressure <130/90. Good to see you today

## 2013-10-29 NOTE — Assessment & Plan Note (Signed)
Stable during hospitalization 

## 2013-10-29 NOTE — Assessment & Plan Note (Signed)
Anticipate CKD anemia

## 2013-10-29 NOTE — Assessment & Plan Note (Signed)
bp stable on current regimen off coreg so will continue this. Did not see need to check BMP today.

## 2013-10-29 NOTE — Progress Notes (Signed)
BP 136/74  Pulse 56  Temp(Src) 97.5 F (36.4 C) (Oral)  Wt 176 lb 8 oz (80.06 kg)   CC: hosp f/u visit  Subjective:    Patient ID: Scott [name], male    DOB: Apr 18, 1940, 73 y.o.   MRN: [id number]  HPI: Scott [name] is a 73 y.o. male presenting on 10/29/2013 for Follow-up   Scott [name] presents today for hospital follow up visit for chest pain with radiation down left arm. It quickly resolved with sublingual nitroglycerin. Completed chest pain workup which I reviewed. He has not scheduled appt with his cardiologist yet (Dr. Meda Coffee). Prior to hospitalization, he may have been overworking himself in the hot yard.   TnI neg x3, EKG NSR 60s without acute changes, echo (see below). Carvedilol was stopped 2/2 bradycardia. Cr stable at 1.6 (his baseline). Hgb 10.2.  Wt Readings from Last 3 Encounters:  10/29/13 176 lb 8 oz (80.06 kg)  10/26/13 181 lb 10.5 oz (82.4 kg)  10/21/13 181 lb 8 oz (82.328 kg)  notes weight dropping, despite good appetite. Hasn't been eating dinner. Brings log of sugars - sugars seem to significantly drop after 5u novolog. Some lows after novolog to 50s, otherwise 100-200s.  On levemir 21u in am and 5u novolog with dinner.  10/2013 2D echo: Study Conclusions - Left ventricle: Inferior wall hypokinesis (new.) The cavity size was normal. Wall thickness was increased in a pattern of mild LVH. Systolic function was normal. The estimated ejection fraction was in the range of 50% to 55%. - Mitral valve: There was mild regurgitation. - Left atrium: The atrium was mildly dilated. - Atrial septum: No defect or patent foramen ovale was identified.  2013 - admitted for chest pain, ruled out for ACS and an exercise stress test was negative for ischemia or prior scar.   Admit date: 10/26/2013  Discharge date: 10/26/2013  No f/u phone call completed - received D/C summary late. Recommendations for Outpatient Follow-up:  1. Pt will need to follow up with PCP in 2-3 weeks post  discharge 2. Please obtain BMP to evaluate electrolytes and kidney function 3. Please also check CBC to evaluate Hg and Hct levels 4. Stop taking coreg until seen by PCP due to bradycardia with heart rate in 40's Discharge Diagnoses:  Active Problems:  Chest pain  Discharge Condition: Stable  Diet recommendation: Heart healthy diet discussed in details  History of present illness:  73 y.o. male came to [facility] ed 10/26/2013 with left sided chest pain radiating to the left arm. Resolved at the time of the admission. Nitro given in ED and pt explains it help with pain. Denies nausea and SOB. Denies fevers, rash, dysuria, abd pain. Endorses recent increase in physical exercise around the home w/ brush clearing Also reports heart burn w/ orange juice which he's had to drink more regluarly due to episodes of hypoglycemia  Hospital Course:  Active Problems:  Chest pain  - now resolved  - pt wants to go home  - he say she has an appointment with cardiologist and PCP  Acute on chronic renal failure  - will continue home medical regimen for now  - pt made aware to talk to PCP about continuing Lisinopril  Bradycardia  - with HR on 30-40's  - will stop Coreg and pt made aware  Relevant past medical, surgical, family and social history reviewed and updated as indicated.  Allergies and medications reviewed and updated. Current Outpatient Prescriptions on File Prior to Visit  Medication Sig  . acetaminophen (TYLENOL) 500 MG tablet Take 1,000 mg by mouth every 6 (six) hours as needed for mild pain.   Marland Kitchen aspirin 325 MG tablet Take 325 mg by mouth daily.  Marland Kitchen atorvastatin (LIPITOR) 80 MG tablet Take 1 tablet (80 mg total) by mouth daily.  . furosemide (LASIX) 40 MG tablet Take 0.5 tablets (20 mg total) by mouth daily as needed. For fluid retention  . Insulin Detemir (LEVEMIR FLEXTOUCH) 100 UNIT/ML Pen Inject 20 Units into the skin every morning.  Marland Kitchen levothyroxine (SYNTHROID, LEVOTHROID) 50 MCG tablet Take 1  tablet (50 mcg total) by mouth daily.  Marland Kitchen lisinopril (PRINIVIL,ZESTRIL) 20 MG tablet Take 1 tablet (20 mg total) by mouth 2 (two) times daily.  . Multiple Vitamin (MULTIVITAMIN) tablet Take 1 tablet by mouth daily.  . niacin 500 MG CR capsule Take 2 capsules (1,000 mg total) by mouth at bedtime.  . nitroGLYCERIN (NITROSTAT) 0.4 MG SL tablet Place 1 tablet (0.4 mg total) under the tongue every 5 (five) minutes as needed for chest pain.   No current facility-administered medications on file prior to visit.    Review of Systems Per HPI unless specifically indicated above    Objective:    BP 136/74  Pulse 56  Temp(Src) 97.5 F (36.4 C) (Oral)  Wt 176 lb 8 oz (80.06 kg)  Physical Exam  Nursing note and vitals reviewed. Constitutional: He appears well-developed and well-nourished. No distress.  Cardiovascular: Regular rhythm, normal heart sounds and intact distal pulses.  Bradycardia present.   No murmur heard. Pulmonary/Chest: Effort normal and breath sounds normal. No respiratory distress. He has no wheezes. He has no rales.  Musculoskeletal: He exhibits no edema.  Psychiatric: He has a normal mood and affect.       Assessment & Plan:   Problem List Items Addressed This Visit   Anemia in CKD (chronic kidney disease)     Anticipate CKD anemia    Chest pain - Primary     Recent hospital w/u ok - cycled cardiac enzymes, EKG but echo showed new inferior wall motion abnormality. Suggested f/u with cardiology for further evaluation of this new finding. Pt now off coreg, only on lisinopril 20mg  bid, statin, lasix 20mg  prn swelling, and aspirin 325mg  daily.    CKD (chronic kidney disease) stage 3, GFR 30-59 ml/min     Stable during hospitalization.    Hypertension     bp stable on current regimen off coreg so will continue this. Did not see need to check BMP today.    Type 2 diabetes with nephropathy     Has slowly increased levemir to 21 u in am with breakfast. Discussed  importance of taking novolog with a meal, and given marked effect of novolog on cbg's will decrease mealtime correction to 3u with dinner. Advised pt keep me informed with update on sugars. Want to avoid hypoglycemia at all costs. Goal A1c <8%    Relevant Medications      insulin aspart (NOVOLOG FLEXPEN) 100 UNIT/ML FlexPen       Follow up plan: Return if symptoms worsen or fail to improve, for keep scheduled appointment.

## 2013-10-29 NOTE — Assessment & Plan Note (Signed)
Has slowly increased levemir to 21 u in am with breakfast. Discussed importance of taking novolog with a meal, and given marked effect of novolog on cbg's will decrease mealtime correction to 3u with dinner. Advised pt keep me informed with update on sugars. Want to avoid hypoglycemia at all costs. Goal A1c <8%

## 2013-11-11 ENCOUNTER — Other Ambulatory Visit: Payer: Self-pay | Admitting: Family Medicine

## 2013-11-11 DIAGNOSIS — R079 Chest pain, unspecified: Secondary | ICD-10-CM | POA: Diagnosis not present

## 2013-11-11 DIAGNOSIS — Z86010 Personal history of colonic polyps: Secondary | ICD-10-CM | POA: Diagnosis not present

## 2013-11-15 ENCOUNTER — Encounter: Payer: Self-pay | Admitting: Cardiology

## 2013-11-15 ENCOUNTER — Ambulatory Visit (INDEPENDENT_AMBULATORY_CARE_PROVIDER_SITE_OTHER): Payer: Medicare Other | Admitting: Cardiology

## 2013-11-15 ENCOUNTER — Telehealth: Payer: Self-pay | Admitting: Family Medicine

## 2013-11-15 VITALS — BP 140/80 | HR 75 | Ht 71.0 in | Wt 176.0 lb

## 2013-11-15 DIAGNOSIS — R079 Chest pain, unspecified: Secondary | ICD-10-CM | POA: Diagnosis not present

## 2013-11-15 DIAGNOSIS — I2510 Atherosclerotic heart disease of native coronary artery without angina pectoris: Secondary | ICD-10-CM

## 2013-11-15 DIAGNOSIS — Z01810 Encounter for preprocedural cardiovascular examination: Secondary | ICD-10-CM | POA: Insufficient documentation

## 2013-11-15 MED ORDER — GLUCOSE BLOOD VI STRP: ORAL_STRIP | Status: DC

## 2013-11-15 MED ORDER — CARVEDILOL 6.25 MG PO TABS: 6.2500 mg | ORAL_TABLET | Freq: Two times a day (BID) | ORAL | Status: DC

## 2013-11-15 NOTE — Telephone Encounter (Signed)
In your IN box for review. Test strips refilled.

## 2013-11-15 NOTE — Patient Instructions (Addendum)
**Note De-Identified Kais Monje Obfuscation** Your physician has recommended you make the following change in your medication: start taking Coreg 6.25 mg twice daily  Your physician has requested that you have a lexiscan myoview. For further information please visit [email or web address]. Please follow instruction sheet, as given.   Your physician wants you to follow-up in: 6 months. You will receive a reminder letter in the mail two months in advance. If you don't receive a letter, please call our office to schedule the follow-up appointment.

## 2013-11-15 NOTE — Telephone Encounter (Signed)
Pt dropped off his bp and sugar readings  Pt stated he has been checking blood sugar readings more often And has ran out of his test strips  [facility] [address]  Please advise when this has been called in Readings on kim's desk

## 2013-11-15 NOTE — Progress Notes (Signed)
Patient ID: CAIDE CAMPI, male   DOB: [birth year], 73 y.o.   MRN: [id number]    Patient Name: Scott [name] Date of Encounter: 11/15/2013  Primary Care Provider:  [name], MD Primary Cardiologist: Dorothy Spark (Dr Verl Blalock)  Problem List   Past Medical History  Diagnosis Date  . Type 2 diabetes with nephropathy     DM refresher course [facility] (04/2013)  . Hypertension   . CKD (chronic kidney disease) stage 3, GFR 30-59 ml/min     [name]  . CAD (coronary artery disease) 10/10/2011    MI s/p PTCA (Dx-OM2 proximal concentric stenosis)  . Anemia 10/11/2011  . Bradycardia 10/10/2011  . Hypothyroidism 10/10/2011  . Thrombocytopenia 10/11/2011  . HLD (hyperlipidemia)   . Arthritis   . Generalized headaches     frequent  . Glaucoma     s/p laser surgery  . Colon polyps   . Acquired hand deformity 1962    hand saw accident at work   Past Surgical History  Procedure Laterality Date  . Back surgery      cervical neck  . [place]  . Colonoscopy  11/2008    1 polyp, diverticulosis, rec rpt 5 yrs (Dr. Oletta Lamas, [name])  . Eye surgery  2012    laser surgery for glaucoma  . US echocardiography  10/2013    inferior wall hypokinesis, mild LVH, EF 50-55%, mild MR and LA dilation    Allergies  No Known Allergies  HPI  Scott [name] is a former patient of Dr Verl Blalock who underwent 2 cardiac caths in 1990 for chest pain and it is unclear if he underwent any PCI or stenting. He was doing well until 2013 when he was admitted for chest pain, ruled out for ACS and an exercise stress test was negative for ischemia or prior scar. His last echocardiogram showed EF of 55-60% with mild biatrial enlargement. There was no significant valvular heart disease. He was also no pericardial effusion.  He came after 2 years on 10/19/2013. He continued being very active, exercising daily. He had no limitations and no symptoms. He is very complaint with his medicines.  He was scheduled to come back after 1  year.   Only about 2 weeks later he was working daily with wood and developed typical exertional chest pain that was mildly improved by sl NTG. He went to the ED, ACS was ruled out. No chest pain since then. His ECG unchanged from prior. His BB was stopped in the ED because of bradycardia.  Home Medications  Prior to Admission medications   Medication Sig Start Date End Date Taking? Authorizing Provider  acetaminophen (TYLENOL) 500 MG tablet Take 500 mg by mouth every 6 (six) hours as needed for pain.   Yes Historical Provider, MD  aspirin 325 MG tablet Take 325 mg by mouth daily.   Yes Historical Provider, MD  carvedilol (COREG) 25 MG tablet TAKE ONE TABLET BY MOUTH TWICE DAILY WITH A MEAL 09/13/13  Yes [name], MD  CRESTOR 20 MG tablet TAKE ONE TABLET BY MOUTH AT BEDTIME   Yes [name], MD  furosemide (LASIX) 40 MG tablet Take 0.5 tablets (20 mg total) by mouth daily as needed. For fluid retention 12/03/11  Yes [name], MD  glucose blood test strip Use to test blood sugar twice daily or as directed. Dx 250.40,  583.81 11/26/12  Yes [name], MD  Insulin Aspart Prot & Aspart (NOVOLOG MIX 70/30 FLEXPEN) (70-30)  100 UNIT/ML Pen INJECT 19 UNITS SUBCUTANEOUSLY IN THE MORNING AND 24 UNITS AT NIGHT 09/20/13  Yes [name], MD  levothyroxine (SYNTHROID, LEVOTHROID) 50 MCG tablet Take 1 tablet (50 mcg total) by mouth daily. 11/06/12  Yes [name], MD  lisinopril (PRINIVIL,ZESTRIL) 20 MG tablet Take 1 tablet (20 mg total) by mouth 2 (two) times daily. 11/06/12  Yes [name], MD  Multiple Vitamin (MULTIVITAMIN) tablet Take 1 tablet by mouth daily.   Yes Historical Provider, MD  niacin 500 MG CR capsule Take 2 capsules (1,000 mg total) by mouth at bedtime. 02/03/12  Yes [name], MD  nitroGLYCERIN (NITROSTAT) 0.4 MG SL tablet Place 1 tablet (0.4 mg total) under the tongue every 5 (five) minutes as needed for chest pain. 10/11/11 11/07/14 Yes Rhonda G  Barrett, PA-C  zolpidem (AMBIEN) 10 MG tablet TAKE ONE-HALF TABLET BY MOUTH AT BEDTIME   Yes [name], MD    Family History  Family History  Problem Relation Age of Onset  . Coronary artery disease Son 47    5v CABG and stents  . Diabetes Sister   . Stomach cancer Sister 51  . Hyperlipidemia Sister   . Stroke Neg Hx     Social History  History   Social History  . Marital Status: Married    Spouse Name: N/A    Number of Children: N/A  . Years of Education: N/A   Occupational History  . Not on file.   Social History Main Topics  . Smoking status: Former Smoker    Quit date: 03/04/1978  . Smokeless tobacco: Never Used  . Alcohol Use: No  . Drug Use: No  . Sexual Activity: Not on file   Other Topics Concern  . Not on file   Social History Narrative   Lives with wife, 1 dog   Occupation: retired, was route Hotel manager   Edu: 11th gr   Activity: walks dog 3-4x/day, yardwork, rides bicycle.   Diet: drinks diet coke, no vegetables     Review of Systems, as per HPI, otherwise negative General:  No chills, fever, night sweats or weight changes.  Cardiovascular:  No chest pain, dyspnea on exertion, edema, orthopnea, palpitations, paroxysmal nocturnal dyspnea. Dermatological: No rash, lesions/masses Respiratory: No cough, dyspnea Urologic: No hematuria, dysuria Abdominal:   No nausea, vomiting, diarrhea, bright red blood per rectum, melena, or hematemesis Neurologic:  No visual changes, wkns, changes in mental status. All other systems reviewed and are otherwise negative except as noted above.  Physical Exam  Blood pressure 140/80, pulse 75, height 5\' 11"  (1.803 m), weight 176 lb (79.833 kg), SpO2 96.00%.  General: Pleasant, NAD Psych: Normal affect. Neuro: Alert and oriented X 3. Moves all extremities spontaneously. HEENT: Normal  Neck: Supple without bruits or JVD. Lungs:  Resp regular and unlabored, CTA. Heart: RRR no s3, s4, or murmurs. Abdomen: Soft,  non-tender, non-distended, BS + x 4.  Extremities: No clubbing, cyanosis or edema. DP/PT/Radials 2+ and equal bilaterally.  Labs:  No results found for this basename: CKTOTAL, CKMB, TROPONINI,  in the last 72 hours Lab Results  Component Value Date   WBC 6.0 10/26/2013   HGB 10.2* 10/26/2013   HCT 31.6* 10/26/2013   MCV 91.9 10/26/2013   PLT 83* 10/26/2013    No results found for this basename: DDIMER   No components found with this basename: POCBNP,     Component Value Date/Time   NA 143 10/25/2013 2209   NA 140 09/04/2011  K 5.1 10/25/2013 2209   K 4.4 09/04/2011   CL 105 10/25/2013 2209   CO2 23 10/25/2013 2209   GLUCOSE 223* 10/25/2013 2209   BUN 32* 10/25/2013 2209   CREATININE 1.64* 10/26/2013 0703   CREATININE 1.6* 10/16/2013   CREATININE 1.9 09/04/2011   CALCIUM 9.6 10/25/2013 2209   PROT 6.7 09/04/2011   ALBUMIN 4.1 06/30/2012 0936   AST 18 09/04/2011   ALT 23 09/04/2011   ALKPHOS 94 09/04/2011   BILITOT 0.6 09/04/2011   GFRNONAA 40* 10/26/2013 0703   GFRAA 46* 10/26/2013 0703   Lab Results  Component Value Date   CHOL 138 06/30/2012   HDL 49.50 06/30/2012   LDLCALC 69 06/30/2012   TRIG 99.0 06/30/2012    Accessory Clinical Findings  Echocardiogram - 2013 Study Conclusions  - Left ventricle: The cavity size was normal. Wall thickness was normal. The estimated ejection fraction was 55%. Wall motion was normal; there were no regional wall motion abnormalities. Features are consistent with a pseudonormal left ventricular filling pattern, with concomitant abnormal relaxation and increased filling pressure (grade 2 diastolic dysfunction). - Aortic valve: There was no stenosis. - Mitral valve: Trivial regurgitation. - Left atrium: The atrium was mildly dilated. - Right ventricle: The cavity size was normal. Systolic function was normal. - Right atrium: The atrium was mildly dilated. - Tricuspid valve: Peak RV-RA gradient: 53mm Hg (S). - Systemic veins: IVC not  visualized. Impressions:  - Normal LV size and systolic function, EF 40%. Moderate diastoilc dysfunction. Normal RV size and systolic function. No significant valvular dysfunction.  ECG - Sinus bradycardia 59 BPM, otherwise normal ECG  Echo 10/26/2013 - Left ventricle: Inferior wall hypokinesis. The cavity size was normal. Wall thickness was increased in a pattern of mild LVH. Systolic function was normal. The estimated ejection fraction was in the range of 50% to 55%. - Mitral valve: There was mild regurgitation. - Left atrium: The atrium was mildly dilated. - Atrial septum: No defect or patent foramen ovale was identified.  ECG: SR, old inferior MI, otherwise normal  Assessment & Plan  1.  CAD, ? Prior intervention, new typical chest pain, ideally we would cath, however CKD stage 3 (baseline Crea 1.6-1.78), we will schedule a Lexiscan nuclear stress test.  Continue  ASA, moderate dose rosuvastatin, lisinopril, restart lower dose of carvedilol. Preserved LVEF, negative stress test in 2013.   2. HTN - uncontrolled, restart lower dose of carvedilol 6.25 mg PO BID.  3. Hyperlipidemia - well controlled, lipids will be checked the next wee k at the PCP office  4. Preoperative evaluation for colonoscopy - if negative stress test we will send a letter  If negative stress test, follow up in 6 months.  Dorothy Spark, MD, [facility] 11/15/2013, 11:32 AM

## 2013-11-16 NOTE — Telephone Encounter (Signed)
Lab Results  Component Value Date   HGBA1C 7.7* 10/26/2013   reviewed log:  Blood pressures: 102-169/50-80, HR 60-90s Sugars: am overall ok. Few high readings randomly throughout the day for which it seems he's taking extra novolog 5u.  1. plz ensure he's taking levemir 22u every morning and check if he's just taking novolog with dinner or with other meals if sugars are elevated. 2. He was also just restarted on coreg - advise him to watch for low sugars with restarting this medication. No changes to meds otherwise.

## 2013-11-17 ENCOUNTER — Emergency Department: Payer: Self-pay | Admitting: Emergency Medicine

## 2013-11-17 ENCOUNTER — Telehealth: Payer: Self-pay | Admitting: Family Medicine

## 2013-11-17 DIAGNOSIS — T383X1A Poisoning by insulin and oral hypoglycemic [antidiabetic] drugs, accidental (unintentional), initial encounter: Secondary | ICD-10-CM | POA: Diagnosis not present

## 2013-11-17 DIAGNOSIS — Z7982 Long term (current) use of aspirin: Secondary | ICD-10-CM | POA: Diagnosis not present

## 2013-11-17 DIAGNOSIS — E119 Type 2 diabetes mellitus without complications: Secondary | ICD-10-CM | POA: Diagnosis not present

## 2013-11-17 DIAGNOSIS — Z794 Long term (current) use of insulin: Secondary | ICD-10-CM | POA: Diagnosis not present

## 2013-11-17 DIAGNOSIS — Z79899 Other long term (current) drug therapy: Secondary | ICD-10-CM | POA: Diagnosis not present

## 2013-11-17 NOTE — Telephone Encounter (Signed)
Pt called in stating he accidentially took too much insulin, so he started eatting tablets to bring it down, but his blood sugar was dropping rapidly. I put the pt on hold and spoke with Mearl Latin and Benjie Karvonen and they advised to send the pt directly to the ER. Pt was asked if he had anyone with him that could accompany him, he stated yes, his wife. When we hung up, pt was headed straight to the ER.  Thank you

## 2013-11-18 NOTE — Telephone Encounter (Signed)
Can we call for an update today?

## 2013-11-18 NOTE — Telephone Encounter (Signed)
Called pt and he is back home. He said they gave him medication and got his blood sugar stable. Pt said he is doing good and feeling fine and he is back home resting because they kept him until 2:00am

## 2013-11-23 ENCOUNTER — Ambulatory Visit (HOSPITAL_COMMUNITY): Payer: Medicare Other | Attending: Internal Medicine | Admitting: Radiology

## 2013-11-23 VITALS — BP 180/83 | HR 50 | Ht 70.0 in | Wt 176.0 lb

## 2013-11-23 DIAGNOSIS — I10 Essential (primary) hypertension: Secondary | ICD-10-CM | POA: Diagnosis not present

## 2013-11-23 DIAGNOSIS — Z01810 Encounter for preprocedural cardiovascular examination: Secondary | ICD-10-CM | POA: Diagnosis not present

## 2013-11-23 DIAGNOSIS — E119 Type 2 diabetes mellitus without complications: Secondary | ICD-10-CM | POA: Diagnosis not present

## 2013-11-23 DIAGNOSIS — R079 Chest pain, unspecified: Secondary | ICD-10-CM

## 2013-11-23 MED ORDER — TECHNETIUM TC 99M SESTAMIBI GENERIC - CARDIOLITE
33.0000 | Freq: Once | INTRAVENOUS | Status: AC | PRN
  Administered 2013-11-23: 33 via INTRAVENOUS

## 2013-11-23 MED ORDER — TECHNETIUM TC 99M SESTAMIBI GENERIC - CARDIOLITE
11.0000 | Freq: Once | INTRAVENOUS | Status: AC | PRN
  Administered 2013-11-23: 11 via INTRAVENOUS

## 2013-11-23 MED ORDER — REGADENOSON 0.4 MG/5ML IV SOLN
0.4000 mg | Freq: Once | INTRAVENOUS | Status: AC
  Administered 2013-11-23: 0.4 mg via INTRAVENOUS

## 2013-11-23 NOTE — Progress Notes (Signed)
[facility] [address] [place], [place] [address] [phone]    Cardiology Nuclear Med Study  Scott [name] is a 73 y.o. male     MRN : [id number]     DOB: [birth year]  Procedure Date: 11/23/2013  Nuclear Med Background Indication for Stress Test:  Evaluation for Ischemia and Surgical Clearance:Colonoscopy with Dr. Oletta Lamas History:  CAD, MPI 2013 ([facility]) EF 60% Cardiac Risk Factors: Hypertension and IDDM  Symptoms:  Chest Pain with Exertion    Nuclear Pre-Procedure Caffeine/Decaff Intake:  11:00pm NPO After: 7:30am   Lungs:  clear O2 Sat: 97% on room air. IV 0.9% NS with Angio Cath:  22g  IV Site: R Wrist  IV Started by:  Matilde Haymaker, RN  Chest Size (in):  40 Cup Size: n/a  Height: 5\' 10"  (1.778 m)  Weight:  176 lb (79.833 kg)  BMI:  Body mass index is 25.25 kg/(m^2). Tech Comments:  No Coreg x 14 hrs    Nuclear Med Study 1 or 2 day study: 1 day  Stress Test Type:  Treadmill/Lexiscan  Reading MD: n/a  Order Authorizing Provider:  Filiberto Pinks  Resting Radionuclide: Technetium 84m Sestamibi  Resting Radionuclide Dose: 11.0 mCi   Stress Radionuclide:  Technetium 65m Sestamibi  Stress Radionuclide Dose: 33.0 mCi           Stress Protocol Rest HR: 50 Stress HR: 96  Rest BP: 180/83 Stress BP: 137/60  Exercise Time (min): n/a METS: n/a           Dose of Adenosine (mg):  n/a Dose of Lexiscan: 0.4 mg  Dose of Atropine (mg): n/a Dose of Dobutamine: n/a mcg/kg/min (at max HR)  Stress Test Technologist: Glade Lloyd, BS-ES  Nuclear Technologist:  Earl Many, CNMT     Rest Procedure:  Myocardial perfusion imaging was performed at rest 45 minutes following the intravenous administration of Technetium 59m Sestamibi. Rest ECG: NSR with non-specific ST-T wave changes  Stress Procedure:  The patient received IV Lexiscan 0.4 mg over 15-seconds with concurrent low level exercise and then Technetium 98m Sestamibi was injected at  30-seconds while the patient continued walking one more minute.  Quantitative spect images were obtained after a 45-minute delay.  During the infusion of Lexiscan the patient complained of SOB and feeling anxious.  These symptoms began to resolve in recovery.  Stress ECG: No significant change from baseline ECG  QPS Raw Data Images:  Normal; no motion artifact; normal heart/lung ratio. Stress Images:  Normal homogeneous uptake in all areas of the myocardium. Rest Images:  Normal homogeneous uptake in all areas of the myocardium. Subtraction (SDS):  No evidence of ischemia. Transient Ischemic Dilatation (Normal <1.22):  1.10 Lung/Heart Ratio (Normal <0.45):  0.28  Quantitative Gated Spect Images QGS EDV:  143 ml QGS ESV:  59 ml  Impression Exercise Capacity:  Lexiscan with low level exercise. BP Response:  Normal blood pressure response. Clinical Symptoms:  Anxiety, shortness of breath ECG Impression:  No significant ST segment change suggestive of ischemia. Comparison with Prior Nuclear Study: No images to compare  Overall Impression:  Low risk stress nuclear study with no significant ischemia identified. Perfusion images suggestive of left ventricular hypertrophy..  LV Ejection Fraction: 58%.  LV Wall Motion:  NL LV Function; NL Wall Motion  Candee Furbish, MD

## 2013-11-24 ENCOUNTER — Encounter: Payer: Self-pay | Admitting: *Deleted

## 2014-01-14 ENCOUNTER — Other Ambulatory Visit: Payer: Self-pay | Admitting: Family Medicine

## 2014-01-14 NOTE — Telephone Encounter (Signed)
plz phone in. 

## 2014-01-14 NOTE — Telephone Encounter (Signed)
Rx called in as directed.   

## 2014-01-14 NOTE — Telephone Encounter (Signed)
Ok to refill 

## 2014-01-19 ENCOUNTER — Other Ambulatory Visit: Payer: Self-pay | Admitting: Family Medicine

## 2014-01-19 DIAGNOSIS — E039 Hypothyroidism, unspecified: Secondary | ICD-10-CM

## 2014-01-19 DIAGNOSIS — D696 Thrombocytopenia, unspecified: Secondary | ICD-10-CM

## 2014-01-19 DIAGNOSIS — N183 Chronic kidney disease, stage 3 unspecified: Secondary | ICD-10-CM

## 2014-01-19 DIAGNOSIS — D631 Anemia in chronic kidney disease: Secondary | ICD-10-CM

## 2014-01-19 DIAGNOSIS — N189 Chronic kidney disease, unspecified: Secondary | ICD-10-CM

## 2014-01-19 DIAGNOSIS — E1121 Type 2 diabetes mellitus with diabetic nephropathy: Secondary | ICD-10-CM

## 2014-01-19 DIAGNOSIS — I10 Essential (primary) hypertension: Secondary | ICD-10-CM

## 2014-01-19 DIAGNOSIS — E785 Hyperlipidemia, unspecified: Secondary | ICD-10-CM

## 2014-01-19 DIAGNOSIS — Z125 Encounter for screening for malignant neoplasm of prostate: Secondary | ICD-10-CM

## 2014-01-21 ENCOUNTER — Other Ambulatory Visit (INDEPENDENT_AMBULATORY_CARE_PROVIDER_SITE_OTHER): Payer: Medicare Other

## 2014-01-21 DIAGNOSIS — I10 Essential (primary) hypertension: Secondary | ICD-10-CM | POA: Diagnosis not present

## 2014-01-21 DIAGNOSIS — N183 Chronic kidney disease, stage 3 unspecified: Secondary | ICD-10-CM

## 2014-01-21 DIAGNOSIS — D696 Thrombocytopenia, unspecified: Secondary | ICD-10-CM

## 2014-01-21 DIAGNOSIS — E785 Hyperlipidemia, unspecified: Secondary | ICD-10-CM

## 2014-01-21 DIAGNOSIS — E1121 Type 2 diabetes mellitus with diabetic nephropathy: Secondary | ICD-10-CM | POA: Diagnosis not present

## 2014-01-21 DIAGNOSIS — Z125 Encounter for screening for malignant neoplasm of prostate: Secondary | ICD-10-CM

## 2014-01-21 DIAGNOSIS — D631 Anemia in chronic kidney disease: Secondary | ICD-10-CM | POA: Diagnosis not present

## 2014-01-21 DIAGNOSIS — N189 Chronic kidney disease, unspecified: Secondary | ICD-10-CM

## 2014-01-21 DIAGNOSIS — E039 Hypothyroidism, unspecified: Secondary | ICD-10-CM

## 2014-01-21 LAB — CBC WITH DIFFERENTIAL/PLATELET
BASOS PCT: 0.3 % (ref 0.0–3.0)
Basophils Absolute: 0 10*3/uL (ref 0.0–0.1)
EOS PCT: 1.1 % (ref 0.0–5.0)
Eosinophils Absolute: 0.1 10*3/uL (ref 0.0–0.7)
HEMATOCRIT: 36.3 % — AB (ref 39.0–52.0)
HEMOGLOBIN: 12 g/dL — AB (ref 13.0–17.0)
LYMPHS ABS: 3.5 10*3/uL (ref 0.7–4.0)
Lymphocytes Relative: 48.7 % — ABNORMAL HIGH (ref 12.0–46.0)
MCHC: 33 g/dL (ref 30.0–36.0)
MCV: 91.6 fl (ref 78.0–100.0)
MONO ABS: 0.4 10*3/uL (ref 0.1–1.0)
Monocytes Relative: 5.9 % (ref 3.0–12.0)
NEUTROS ABS: 3.2 10*3/uL (ref 1.4–7.7)
NEUTROS PCT: 44 % (ref 43.0–77.0)
Platelets: 114 10*3/uL — ABNORMAL LOW (ref 150.0–400.0)
RBC: 3.97 Mil/uL — AB (ref 4.22–5.81)
RDW: 13.8 % (ref 11.5–15.5)
WBC: 7.3 10*3/uL (ref 4.0–10.5)

## 2014-01-21 LAB — RENAL FUNCTION PANEL
ALBUMIN: 4.3 g/dL (ref 3.5–5.2)
BUN: 31 mg/dL — ABNORMAL HIGH (ref 6–23)
CO2: 29 mEq/L (ref 19–32)
Calcium: 10.1 mg/dL (ref 8.4–10.5)
Chloride: 109 mEq/L (ref 96–112)
Creatinine, Ser: 1.6 mg/dL — ABNORMAL HIGH (ref 0.4–1.5)
GFR: 46.86 mL/min — ABNORMAL LOW (ref >60.00)
GLUCOSE: 106 mg/dL — AB (ref 70–99)
PHOSPHORUS: 2.8 mg/dL (ref 2.3–4.6)
POTASSIUM: 4.8 meq/L (ref 3.5–5.1)
Sodium: 144 mEq/L (ref 135–145)

## 2014-01-21 LAB — LIPID PANEL
CHOL/HDL RATIO: 3
Cholesterol: 154 mg/dL (ref 0–200)
HDL: 60.6 mg/dL (ref >39.00)
LDL CALC: 81 mg/dL (ref 0–99)
NonHDL: 93.4
TRIGLYCERIDES: 63 mg/dL (ref 0.0–149.0)
VLDL: 12.6 mg/dL (ref 0.0–40.0)

## 2014-01-21 LAB — TSH: TSH: 1.66 u[IU]/mL (ref 0.35–4.50)

## 2014-01-21 LAB — PSA, MEDICARE: PSA: 2.44 ng/ml (ref 0.10–4.00)

## 2014-01-21 LAB — HEMOGLOBIN A1C: HEMOGLOBIN A1C: 8.8 % — AB (ref 4.6–6.5)

## 2014-01-25 ENCOUNTER — Ambulatory Visit (INDEPENDENT_AMBULATORY_CARE_PROVIDER_SITE_OTHER): Payer: Medicare Other | Admitting: Family Medicine

## 2014-01-25 ENCOUNTER — Encounter: Payer: Self-pay | Admitting: Family Medicine

## 2014-01-25 VITALS — BP 110/72 | HR 56 | Temp 97.6°F | Ht 73.0 in | Wt 172.8 lb

## 2014-01-25 DIAGNOSIS — Z7189 Other specified counseling: Secondary | ICD-10-CM | POA: Insufficient documentation

## 2014-01-25 DIAGNOSIS — Z0000 Encounter for general adult medical examination without abnormal findings: Secondary | ICD-10-CM | POA: Diagnosis not present

## 2014-01-25 DIAGNOSIS — E1165 Type 2 diabetes mellitus with hyperglycemia: Secondary | ICD-10-CM

## 2014-01-25 DIAGNOSIS — Z23 Encounter for immunization: Secondary | ICD-10-CM | POA: Diagnosis not present

## 2014-01-25 DIAGNOSIS — I2510 Atherosclerotic heart disease of native coronary artery without angina pectoris: Secondary | ICD-10-CM

## 2014-01-25 DIAGNOSIS — E785 Hyperlipidemia, unspecified: Secondary | ICD-10-CM

## 2014-01-25 DIAGNOSIS — E039 Hypothyroidism, unspecified: Secondary | ICD-10-CM

## 2014-01-25 DIAGNOSIS — N183 Chronic kidney disease, stage 3 unspecified: Secondary | ICD-10-CM

## 2014-01-25 DIAGNOSIS — D696 Thrombocytopenia, unspecified: Secondary | ICD-10-CM

## 2014-01-25 DIAGNOSIS — N189 Chronic kidney disease, unspecified: Secondary | ICD-10-CM

## 2014-01-25 DIAGNOSIS — I10 Essential (primary) hypertension: Secondary | ICD-10-CM

## 2014-01-25 DIAGNOSIS — E1121 Type 2 diabetes mellitus with diabetic nephropathy: Secondary | ICD-10-CM

## 2014-01-25 DIAGNOSIS — IMO0002 Reserved for concepts with insufficient information to code with codable children: Secondary | ICD-10-CM

## 2014-01-25 DIAGNOSIS — D631 Anemia in chronic kidney disease: Secondary | ICD-10-CM

## 2014-01-25 NOTE — Assessment & Plan Note (Signed)

## 2014-01-25 NOTE — Assessment & Plan Note (Signed)
Continue levemir 25u QAM and novolog flexpen 5u with supper. Will need to change to humalog kwikpen 2/2 at beginning of year 2/2 formulary change.

## 2014-01-25 NOTE — Assessment & Plan Note (Signed)
Great control,continue regimen.

## 2014-01-25 NOTE — Assessment & Plan Note (Signed)
Stabilized to slightly improved.

## 2014-01-25 NOTE — Addendum Note (Signed)
Addended by: Royann Shivers A on: 01/25/2014 11:12 AM   Modules accepted: Orders

## 2014-01-25 NOTE — Assessment & Plan Note (Signed)
Chronic, stable. Continue levothyroxine. 

## 2014-01-25 NOTE — Assessment & Plan Note (Signed)
Actually improved. Continue to monitor.

## 2014-01-25 NOTE — Progress Notes (Signed)
Pre visit review using our clinic review tool, if applicable. No additional management support is needed unless otherwise documented below in the visit note. 

## 2014-01-25 NOTE — Progress Notes (Signed)
BP 110/72 mmHg  Pulse 56  Temp(Src) 97.6 F (36.4 C) (Oral)  Ht 6\' 1"  (1.854 m)  Wt 172 lb 12 oz (78.359 kg)  BMI 22.80 kg/m2   CC: medicare wellness visit  Subjective:    Patient ID: Scott [name], male    DOB: March 21, 1940, 73 y.o.   MRN: [id number]  HPI: Scott [name] is a 73 y.o. male presenting on 01/25/2014 for Annual Exam   Failed hearing screen today. Notices some trouble. Has seen audiologist, [place] in past. Recent eye exam - Sees Dr. Katy Fitch Q6 mo for h/o glaucoma s/p laser surgery. No falls in last year. Denies depression/sadness. Enjoys hobbies. Walks dog.   Preventative: Colonoscopy 2010, rec rpt 5 yrs given personal hx colon polyps. +diverticulosis. To schedule rpt 2015 Oletta Lamas).  Prostate screening - denies urinary changes. To check this year .year.States has had checked every year and normal but no records available Flu shot today. UTD pneumovax (2013). prevnar 07/2013 zostavax 2015 Doesn't think UTD tetanus - will await given not covered by insurance.  Advanced directives - discussed.wants full code. Packet provided today. Unsure HCPOA, but likely 2 sons.  Lives with wife, 1 dog Occupation: retired, was route Hotel manager Edu: 11th gr Activity: walks dog 3-4x/day, yardwork, rides bicycle. Diet: drinks diet coke, no vegetables  Relevant past medical, surgical, family and social history reviewed and updated as indicated.  Allergies and medications reviewed and updated. Current Outpatient Prescriptions on File Prior to Visit  Medication Sig  . acetaminophen (TYLENOL) 500 MG tablet Take 1,000 mg by mouth every 6 (six) hours as needed for mild pain.   Marland Kitchen aspirin 325 MG tablet Take 325 mg by mouth daily.  Marland Kitchen atorvastatin (LIPITOR) 80 MG tablet Take 1 tablet (80 mg total) by mouth daily.  . carvedilol (COREG) 6.25 MG tablet Take 1 tablet (6.25 mg total) by mouth 2 (two) times daily.  . furosemide (LASIX) 40 MG tablet Take 0.5 tablets (20 mg total) by  mouth daily as needed. For fluid retention  . glucose blood test strip Use to check sugar twice daily or as directed. ONE TOUCH ULTRA Dx: 250.40  . insulin aspart (NOVOLOG FLEXPEN) 100 UNIT/ML FlexPen Inject 5 Units into the skin See admin instructions. With supper  . Insulin Detemir (LEVEMIR FLEXTOUCH) 100 UNIT/ML Pen Inject 25 Units into the skin every morning.   Marland Kitchen levothyroxine (SYNTHROID, LEVOTHROID) 50 MCG tablet TAKE ONE TABLET BY MOUTH ONCE DAILY  . lisinopril (PRINIVIL,ZESTRIL) 20 MG tablet TAKE ONE TABLET BY MOUTH TWICE DAILY  . Multiple Vitamin (MULTIVITAMIN) tablet Take 1 tablet by mouth daily.  . niacin 500 MG CR capsule Take 2 capsules (1,000 mg total) by mouth at bedtime.  . nitroGLYCERIN (NITROSTAT) 0.4 MG SL tablet Place 1 tablet (0.4 mg total) under the tongue every 5 (five) minutes as needed for chest pain.  Marland Kitchen zolpidem (AMBIEN) 10 MG tablet TAKE ONE-HALF TABLET BY MOUTH AT BEDTIME   No current facility-administered medications on file prior to visit.    Review of Systems Per HPI unless specifically indicated above    Objective:    BP 110/72 mmHg  Pulse 56  Temp(Src) 97.6 F (36.4 C) (Oral)  Ht 6\' 1"  (1.854 m)  Wt 172 lb 12 oz (78.359 kg)  BMI 22.80 kg/m2  Physical Exam  Constitutional: He is oriented to person, place, and time. He appears well-developed and well-nourished. No distress.  HENT:  Head: Normocephalic and atraumatic.  Right Ear: Hearing, tympanic membrane, external  ear and ear canal normal.  Left Ear: Hearing, tympanic membrane, external ear and ear canal normal.  Nose: Nose normal.  Mouth/Throat: Uvula is midline, oropharynx is clear and moist and mucous membranes are normal. No oropharyngeal exudate, posterior oropharyngeal edema or posterior oropharyngeal erythema.  Eyes: Conjunctivae and EOM are normal. Pupils are equal, round, and reactive to light. No scleral icterus.  Neck: Normal range of motion. Neck supple. Carotid bruit is not present. No  thyromegaly present.  Cardiovascular: Normal rate, regular rhythm, normal heart sounds and intact distal pulses.   No murmur heard. Pulses:      Radial pulses are 2+ on the right side, and 2+ on the left side.  Pulmonary/Chest: Effort normal and breath sounds normal. No respiratory distress. He has no wheezes. He has no rales.  Abdominal: Soft. Bowel sounds are normal. He exhibits no distension and no mass. There is no tenderness. There is no rebound and no guarding.  Genitourinary: Rectum normal and prostate normal. Rectal exam shows no external hemorrhoid, no internal hemorrhoid, no fissure, no mass, no tenderness and anal tone normal. Prostate is not enlarged (20gm) and not tender.  Musculoskeletal: Normal range of motion. He exhibits no edema.  Lymphadenopathy:    He has no cervical adenopathy.  Neurological: He is alert and oriented to person, place, and time.  CN grossly intact, station and gait intact Recall 0/3 with cue 0/3 Calculation 4/5 serial 3s  Skin: Skin is warm and dry. No rash noted.  Psychiatric: He has a normal mood and affect. His behavior is normal. Judgment and thought content normal.  Nursing note and vitals reviewed.  Results for orders placed or performed in visit on 01/21/14  CBC with Differential  Result Value Ref Range   WBC 7.3 4.0 - 10.5 K/uL   RBC 3.97 (L) 4.22 - 5.81 Mil/uL   Hemoglobin 12.0 (L) 13.0 - 17.0 g/dL   HCT 36.3 (L) 39.0 - 52.0 %   MCV 91.6 78.0 - 100.0 fl   MCHC 33.0 30.0 - 36.0 g/dL   RDW 13.8 11.5 - 15.5 %   Platelets 114.0 (L) 150.0 - 400.0 K/uL   Neutrophils Relative % 44.0 43.0 - 77.0 %   Lymphocytes Relative 48.7 (H) 12.0 - 46.0 %   Monocytes Relative 5.9 3.0 - 12.0 %   Eosinophils Relative 1.1 0.0 - 5.0 %   Basophils Relative 0.3 0.0 - 3.0 %   Neutro Abs 3.2 1.4 - 7.7 K/uL   Lymphs Abs 3.5 0.7 - 4.0 K/uL   Monocytes Absolute 0.4 0.1 - 1.0 K/uL   Eosinophils Absolute 0.1 0.0 - 0.7 K/uL   Basophils Absolute 0.0 0.0 - 0.1 K/uL    Lipid panel  Result Value Ref Range   Cholesterol 154 0 - 200 mg/dL   Triglycerides 63.0 0.0 - 149.0 mg/dL   HDL 60.60 >39.00 mg/dL   VLDL 12.6 0.0 - 40.0 mg/dL   LDL Cholesterol 81 0 - 99 mg/dL   Total CHOL/HDL Ratio 3    NonHDL 93.40   Renal function panel  Result Value Ref Range   Sodium 144 135 - 145 mEq/L   Potassium 4.8 3.5 - 5.1 mEq/L   Chloride 109 96 - 112 mEq/L   CO2 29 19 - 32 mEq/L   Calcium 10.1 8.4 - 10.5 mg/dL   Albumin 4.3 3.5 - 5.2 g/dL   BUN 31 (H) 6 - 23 mg/dL   Creatinine, Ser 1.6 (H) 0.4 - 1.5 mg/dL   Glucose,  Bld 106 (H) 70 - 99 mg/dL   Phosphorus 2.8 2.3 - 4.6 mg/dL   GFR 46.86 (L) >60.00 mL/min  TSH  Result Value Ref Range   TSH 1.66 0.35 - 4.50 uIU/mL  Hemoglobin A1c  Result Value Ref Range   Hgb A1c MFr Bld 8.8 (H) 4.6 - 6.5 %  PSA, Medicare  Result Value Ref Range   PSA 2.44 0.10 - 4.00 ng/ml      Assessment & Plan:   Problem List Items Addressed This Visit    Uncontrolled type 2 diabetes mellitus with nephropathy    Continue levemir 25u QAM and novolog flexpen 5u with supper. Will need to change to humalog kwikpen 2/2 at beginning of year 2/2 formulary change.    Thrombocytopenia    Stabilized to slightly improved.    Medicare annual wellness visit, subsequent - Primary    I have personally reviewed the Medicare Annual Wellness questionnaire and have noted 1. The patient's medical and social history 2. Their use of alcohol, tobacco or illicit drugs 3. Their current medications and supplements 4. The patient's functional ability including ADL's, fall risks, home safety risks and hearing or visual impairment. 5. Diet and physical activity 6. Evidence for depression or mood disorders The patients weight, height, BMI have been recorded in the chart.  Hearing and vision has been addressed. I have made referrals, counseling and provided education to the patient based review of the above and I have provided the pt with a written personalized  care plan for preventive services. Provider list updated - see scanned questionairre.  Reviewed preventative protocols and updated unless pt declined.    Hypothyroidism    Chronic, stable. Continue levothyroxine.    Hypertension    Chronic, stable. Continue current regimen.    HLD (hyperlipidemia)    Great control,continue regimen.    CKD (chronic kidney disease) stage 3, GFR 30-59 ml/min    Stable. Continue to monitor.    CAD (coronary artery disease)    Currently asxs. Continue medical management.    Anemia in CKD (chronic kidney disease)    Actually improved. Continue to monitor.    Advanced care planning/counseling discussion    Advanced directives - discussed.wants full code. Packet provided today. Unsure HCPOA, but likely 2 sons.        Follow up plan: Return in about 3 months (around 04/27/2014), or as needed, for follow up visit.

## 2014-01-25 NOTE — Assessment & Plan Note (Signed)
Stable.       - Continue to monitor

## 2014-01-25 NOTE — Patient Instructions (Addendum)
Flu shot today. You did fail your hearing test today. Consider hearing aides. Schedule colonoscopy. Remind [place] to change novolog flexpen to humalog kwik pen at your next insulin refill. Return in 3-4 months for diabetes follow up.

## 2014-01-25 NOTE — Assessment & Plan Note (Signed)
Chronic, stable. Continue current regimen. 

## 2014-01-25 NOTE — Assessment & Plan Note (Signed)
Advanced directives - discussed.wants full code. Packet provided today. Unsure HCPOA, but likely 2 sons.

## 2014-01-25 NOTE — Assessment & Plan Note (Signed)
Currently asxs. Continue medical management.

## 2014-02-16 ENCOUNTER — Other Ambulatory Visit: Payer: Self-pay | Admitting: *Deleted

## 2014-02-16 DIAGNOSIS — H2513 Age-related nuclear cataract, bilateral: Secondary | ICD-10-CM | POA: Diagnosis not present

## 2014-02-16 DIAGNOSIS — H40013 Open angle with borderline findings, low risk, bilateral: Secondary | ICD-10-CM | POA: Diagnosis not present

## 2014-02-16 MED ORDER — GLUCOSE BLOOD VI STRP: ORAL_STRIP | Status: DC

## 2014-03-09 ENCOUNTER — Telehealth: Payer: Self-pay | Admitting: Family Medicine

## 2014-03-09 MED ORDER — INSULIN LISPRO 100 UNIT/ML (KWIKPEN): 5.0000 [IU] | PEN_INJECTOR | Freq: Every day | SUBCUTANEOUS | Status: DC

## 2014-03-09 NOTE — Telephone Encounter (Signed)
On kim's desk

## 2014-03-09 NOTE — Telephone Encounter (Signed)
Insurance will no longer cover Novolog. Will need substitution to either Humulin or Humalog sent into [facility] today. Only has 1 dose of Novolog left.

## 2014-03-09 NOTE — Telephone Encounter (Signed)
Pt dropped off insurance letter indicating change in coverage for novolog flex pen.  Pt has a letter with covered prescriptions to replace it with. Pt is on last dose of medication and needs refill today.  [address].  On Lugene's desk

## 2014-03-09 NOTE — Telephone Encounter (Signed)
Sent new med [employer], plz ensure levemir still ok to use.

## 2014-03-09 NOTE — Telephone Encounter (Signed)
Patient notified and said that levemir was still okay.

## 2014-03-17 ENCOUNTER — Other Ambulatory Visit: Payer: Self-pay | Admitting: Family Medicine

## 2014-03-17 NOTE — Telephone Encounter (Signed)
plz phone in. 

## 2014-03-17 NOTE — Telephone Encounter (Signed)
Ok to refill 

## 2014-03-18 ENCOUNTER — Other Ambulatory Visit: Payer: Self-pay | Admitting: Family Medicine

## 2014-03-18 NOTE — Telephone Encounter (Signed)
Rx called in as directed.   

## 2014-04-11 ENCOUNTER — Telehealth: Payer: Self-pay | Admitting: Family Medicine

## 2014-04-11 NOTE — Telephone Encounter (Signed)
Pt dropped off list of blood pressure readings for your review.  Pt said that dr Rivka Spring has put pt on lower dosage of medication and pt has been having headaches.  Pt is wondering if he is on correct dosage of medication.  Please review and contact pt if changes need to be made.  Thanks

## 2014-04-11 NOTE — Telephone Encounter (Signed)
Reviewed bp log:  130-160/50-70s, HR 50-60s. One isolated reading to 203/111.  This dose of carvedilol is ok - it was decreased because he was having more bradycardia on higher dose. Ensure he's also taking lisinopril 20mg  bid. If persistent headaches would offer office visit.

## 2014-04-11 NOTE — Telephone Encounter (Signed)
Spoke with patient. He said he has been taking one half of the 25 mg Carvedilol that he had left over on some of the days that he had the headaches and they would go away. I advised him that it was decreased due to his heart rate dropping too low and that it was dangerous to do that without a doctor approving the change. I told him that you suggested an appt for the headaches. He said he knew they were BP related since they went away when he increased the carvedilol. He said he had an appt scheduled for March. I moved his appt to this coming Friday since he didn't need to wait a month for eval.

## 2014-04-11 NOTE — Telephone Encounter (Signed)
In your IN box for review. Medication is carvedilol.

## 2014-04-15 ENCOUNTER — Encounter: Payer: Self-pay | Admitting: Family Medicine

## 2014-04-15 ENCOUNTER — Ambulatory Visit (INDEPENDENT_AMBULATORY_CARE_PROVIDER_SITE_OTHER): Payer: Medicare Other | Admitting: Family Medicine

## 2014-04-15 VITALS — BP 162/69 | HR 60 | Temp 97.9°F | Wt 169.8 lb

## 2014-04-15 DIAGNOSIS — E1129 Type 2 diabetes mellitus with other diabetic kidney complication: Secondary | ICD-10-CM | POA: Diagnosis not present

## 2014-04-15 DIAGNOSIS — E1165 Type 2 diabetes mellitus with hyperglycemia: Secondary | ICD-10-CM | POA: Diagnosis not present

## 2014-04-15 DIAGNOSIS — E1121 Type 2 diabetes mellitus with diabetic nephropathy: Secondary | ICD-10-CM

## 2014-04-15 DIAGNOSIS — I10 Essential (primary) hypertension: Secondary | ICD-10-CM | POA: Diagnosis not present

## 2014-04-15 DIAGNOSIS — IMO0002 Reserved for concepts with insufficient information to code with codable children: Secondary | ICD-10-CM

## 2014-04-15 MED ORDER — AMLODIPINE BESYLATE 2.5 MG PO TABS: 2.5000 mg | ORAL_TABLET | Freq: Every day | ORAL | Status: DC

## 2014-04-15 NOTE — Progress Notes (Signed)
BP 162/69 mmHg  Pulse 60  Temp(Src) 97.9 F (36.6 C) (Oral)  Wt 169 lb 12 oz (76.998 kg)   CC: headache, check BP  Subjective:    Patient ID: Scott [name], male    DOB: 1940/06/15, 74 y.o.   MRN: [id number]  HPI: Scott [name] is a 74 y.o. male presenting on 04/15/2014 for Follow-up and Headache   Worried about elevated blood pressure leading to headaches. Noticing some nose bleeds this week. No significant congestion/pressure in head. Headache described as lingering discomfort around eyes. No vision changes. Denies eye straining. Significant screen time. Last eye exam 08/2013. Hasn't tried tylenol for pain.  Brings log of sugars fasting 140-200s and post prandial 63-300.  Brings log of blood pressures -123-162/60-70s, HR 50-60s.  Pt's meter compared to ours - 16 pts higher  Lab Results  Component Value Date   CREATININE 1.6* 01/21/2014   Lab Results  Component Value Date   HGBA1C 8.8* 01/21/2014   Trouble sleeping at night time. Takes 1am snack nightly.   Relevant past medical, surgical, family and social history reviewed and updated as indicated. Interim medical history since our last visit reviewed. Allergies and medications reviewed and updated. Current Outpatient Prescriptions on File Prior to Visit  Medication Sig  . acetaminophen (TYLENOL) 500 MG tablet Take 1,000 mg by mouth every 6 (six) hours as needed for mild pain.   Marland Kitchen aspirin 325 MG tablet Take 325 mg by mouth daily.  Marland Kitchen atorvastatin (LIPITOR) 80 MG tablet Take 1 tablet (80 mg total) by mouth daily.  . carvedilol (COREG) 6.25 MG tablet Take 1 tablet (6.25 mg total) by mouth 2 (two) times daily.  . furosemide (LASIX) 40 MG tablet Take 0.5 tablets (20 mg total) by mouth daily as needed. For fluid retention  . glucose blood test strip Use to check sugar twice daily or as directed. ONE TOUCH ULTRA BLUE Dx: E11.29  . Insulin Detemir (LEVEMIR FLEXTOUCH) 100 UNIT/ML Pen Inject 30 Units into the skin every morning.     Marland Kitchen levothyroxine (SYNTHROID, LEVOTHROID) 50 MCG tablet TAKE ONE TABLET BY MOUTH ONCE DAILY  . lisinopril (PRINIVIL,ZESTRIL) 20 MG tablet TAKE ONE TABLET BY MOUTH TWICE DAILY  . Multiple Vitamin (MULTIVITAMIN) tablet Take 1 tablet by mouth daily.  . niacin 500 MG CR capsule Take 2 capsules (1,000 mg total) by mouth at bedtime.  . nitroGLYCERIN (NITROSTAT) 0.4 MG SL tablet Place 1 tablet (0.4 mg total) under the tongue every 5 (five) minutes as needed for chest pain.  Marland Kitchen zolpidem (AMBIEN) 10 MG tablet TAKE ONE-HALF TABLET BY MOUTH AT BEDTIME   No current facility-administered medications on file prior to visit.    Review of Systems Per HPI unless specifically indicated above     Objective:    BP 162/69 mmHg  Pulse 60  Temp(Src) 97.9 F (36.6 C) (Oral)  Wt 169 lb 12 oz (76.998 kg)  Wt Readings from Last 3 Encounters:  04/15/14 169 lb 12 oz (76.998 kg)  01/25/14 172 lb 12 oz (78.359 kg)  11/23/13 176 lb (79.833 kg)    Physical Exam  Constitutional: He appears well-developed and well-nourished. No distress.  HENT:  Mouth/Throat: Oropharynx is clear and moist. No oropharyngeal exudate.  Cardiovascular: Normal rate, regular rhythm, normal heart sounds and intact distal pulses.   No murmur heard. Pulmonary/Chest: Effort normal and breath sounds normal. No respiratory distress. He has no wheezes. He has no rales.  Musculoskeletal: He exhibits no edema.  Skin: Skin  is warm and dry. No rash noted.  Psychiatric: He has a normal mood and affect.  Nursing note and vitals reviewed.      Assessment & Plan:   Problem List Items Addressed This Visit    Uncontrolled type 2 diabetes mellitus with nephropathy    Will slowly taper levemir up to 30u goal am. Will decrease lispro to 5u with meals (breakfast and dinner). Discussed when to check sugars as well as need to monitor for low sugars with any change in insulin regimen.      Relevant Medications   insulin lispro (HUMALOG) 100 UNIT/ML  KiwkPen   Hypertension - Primary    Anticipate elevated BP leading to recent headaches. Discussed reason why he's on lower carvediolol. Will continue carvediolol 6.25mg  bid and lisinopril 20mg  bid. Start amlodipine 2.5mg  daily. Pt has upcoming f/u with eye doctor.      Relevant Medications   amLODIpine (NORVASC)  tablet       Follow up plan: Return in about 2 months (around 06/14/2014), or if symptoms worsen or fail to improve.

## 2014-04-15 NOTE — Assessment & Plan Note (Signed)
Anticipate elevated BP leading to recent headaches. Discussed reason why he's on lower carvediolol. Will continue carvediolol 6.25mg  bid and lisinopril 20mg  bid. Start amlodipine 2.5mg  daily. Pt has upcoming f/u with eye doctor.

## 2014-04-15 NOTE — Progress Notes (Signed)
Pre visit review using our clinic review tool, if applicable. No additional management support is needed unless otherwise documented below in the visit note. 

## 2014-04-15 NOTE — Patient Instructions (Addendum)
Increase levemir by 1 unit every 3 days if average sugar > 150. Max levemir will be 30 units in the morning. Decrease lispro to 5 units with meals (breakfast and dinner). Watch for low sugars. Check sugars either prior to a meal or 2 hours after a meal.  Always take insulin with a meal. For blood pressure - start amlodipine 2.5mg  daily. Continue lisinopril and carvedilol. I think your headache is coming from high blood pressures.  Keep next month's appointment for follow up.

## 2014-04-15 NOTE — Assessment & Plan Note (Signed)
Will slowly taper levemir up to 30u goal am. Will decrease lispro to 5u with meals (breakfast and dinner). Discussed when to check sugars as well as need to monitor for low sugars with any change in insulin regimen.

## 2014-04-20 ENCOUNTER — Encounter: Payer: Self-pay | Admitting: *Deleted

## 2014-05-05 ENCOUNTER — Encounter: Payer: Self-pay | Admitting: Family Medicine

## 2014-05-05 ENCOUNTER — Ambulatory Visit (INDEPENDENT_AMBULATORY_CARE_PROVIDER_SITE_OTHER): Payer: Medicare Other | Admitting: Family Medicine

## 2014-05-05 VITALS — BP 150/80 | HR 48 | Temp 98.0°F | Wt 168.1 lb

## 2014-05-05 DIAGNOSIS — E1129 Type 2 diabetes mellitus with other diabetic kidney complication: Secondary | ICD-10-CM

## 2014-05-05 DIAGNOSIS — N183 Chronic kidney disease, stage 3 unspecified: Secondary | ICD-10-CM

## 2014-05-05 DIAGNOSIS — E1121 Type 2 diabetes mellitus with diabetic nephropathy: Secondary | ICD-10-CM

## 2014-05-05 DIAGNOSIS — E1165 Type 2 diabetes mellitus with hyperglycemia: Secondary | ICD-10-CM

## 2014-05-05 DIAGNOSIS — I10 Essential (primary) hypertension: Secondary | ICD-10-CM | POA: Diagnosis not present

## 2014-05-05 DIAGNOSIS — IMO0002 Reserved for concepts with insufficient information to code with codable children: Secondary | ICD-10-CM

## 2014-05-05 LAB — RENAL FUNCTION PANEL
ALBUMIN: 4.4 g/dL (ref 3.5–5.2)
BUN: 28 mg/dL — AB (ref 6–23)
CO2: 30 mEq/L (ref 19–32)
Calcium: 10.3 mg/dL (ref 8.4–10.5)
Chloride: 105 mEq/L (ref 96–112)
Creatinine, Ser: 1.52 mg/dL — ABNORMAL HIGH (ref 0.40–1.50)
GFR: 47.9 mL/min — ABNORMAL LOW (ref >60.00)
Glucose, Bld: 191 mg/dL — ABNORMAL HIGH (ref 70–99)
Phosphorus: 3.2 mg/dL (ref 2.3–4.6)
Potassium: 5.1 mEq/L (ref 3.5–5.1)
Sodium: 139 mEq/L (ref 135–145)

## 2014-05-05 LAB — HEMOGLOBIN A1C: HEMOGLOBIN A1C: 8.4 % — AB (ref 4.6–6.5)

## 2014-05-05 NOTE — Assessment & Plan Note (Signed)
bp better on recheck but still elevated with CKD. However brings log of great control at home. Will not make any changes today. F/u in 3 mo. Pt will continue monitoring bp closely at hoem. Satisfied with low dose amlodipine effect.

## 2014-05-05 NOTE — Assessment & Plan Note (Signed)
Over last week improvement noted in glycemic control - correlating with wife's stricter diabetic diet and lifestyle changes which have translated to positive change for him. Continue current regimen, advised to notify me if any recurrent low sugars of hypoglycemic symptoms.

## 2014-05-05 NOTE — Progress Notes (Signed)
Pre visit review using our clinic review tool, if applicable. No additional management support is needed unless otherwise documented below in the visit note. 

## 2014-05-05 NOTE — Progress Notes (Addendum)
BP 150/80 mmHg  Pulse 48  Temp(Src) 98 F (36.7 C)  Wt 168 lb 1.9 oz (76.259 kg)   CC: f/u visit  Subjective:    Patient ID: Scott [name], male    DOB: 1940/08/07, 74 y.o.   MRN: [id number]  HPI: Scott [name] is a 74 y.o. male presenting on 05/05/2014 for Follow-up   Elevated blood pressures recently - last visit we continued carvedilol 6.25mg  bid and lisinopril 20mg  bid and started amlodipine 2.5mg  daily. bp remaining elevated. Brings log of blood pressures which range 110-140/60s, HR 50-70.   DM - last visit we increased levemir to 30u in am, decreased humalog to 5u with breakfast and dinner. Brings log showing sugars ranging 50-200s.  Lab Results  Component Value Date   HGBA1C 8.4* 05/05/2014   Lab Results  Component Value Date   CREATININE 1.52* 05/05/2014    Relevant past medical, surgical, family and social history reviewed and updated as indicated. Interim medical history since our last visit reviewed. Allergies and medications reviewed and updated. Current Outpatient Prescriptions on File Prior to Visit  Medication Sig  . acetaminophen (TYLENOL) 500 MG tablet Take 1,000 mg by mouth every 6 (six) hours as needed for mild pain.   Marland Kitchen amLODipine (NORVASC) 2.5 MG tablet Take 1 tablet (2.5 mg total) by mouth daily.  Marland Kitchen aspirin 325 MG tablet Take 325 mg by mouth daily.  Marland Kitchen atorvastatin (LIPITOR) 80 MG tablet Take 1 tablet (80 mg total) by mouth daily.  . carvedilol (COREG) 6.25 MG tablet Take 1 tablet (6.25 mg total) by mouth 2 (two) times daily.  . furosemide (LASIX) 40 MG tablet Take 0.5 tablets (20 mg total) by mouth daily as needed. For fluid retention  . glucose blood test strip Use to check sugar twice daily or as directed. ONE TOUCH ULTRA BLUE Dx: E11.29  . Insulin Detemir (LEVEMIR FLEXTOUCH) 100 UNIT/ML Pen Inject 30 Units into the skin every morning.   . insulin lispro (HUMALOG) 100 UNIT/ML KiwkPen Inject 5 Units into the skin 2 (two) times daily before a meal.   .  levothyroxine (SYNTHROID, LEVOTHROID) 50 MCG tablet TAKE ONE TABLET BY MOUTH ONCE DAILY  . lisinopril (PRINIVIL,ZESTRIL) 20 MG tablet TAKE ONE TABLET BY MOUTH TWICE DAILY  . niacin 500 MG CR capsule Take 2 capsules (1,000 mg total) by mouth at bedtime.  . nitroGLYCERIN (NITROSTAT) 0.4 MG SL tablet Place 1 tablet (0.4 mg total) under the tongue every 5 (five) minutes as needed for chest pain.  Marland Kitchen zolpidem (AMBIEN) 10 MG tablet TAKE ONE-HALF TABLET BY MOUTH AT BEDTIME  . Multiple Vitamin (MULTIVITAMIN) tablet Take 1 tablet by mouth daily.   No current facility-administered medications on file prior to visit.    Review of Systems Per HPI unless specifically indicated above     Objective:    BP 150/80 mmHg  Pulse 48  Temp(Src) 98 F (36.7 C)  Wt 168 lb 1.9 oz (76.259 kg)  Wt Readings from Last 3 Encounters:  05/05/14 168 lb 1.9 oz (76.259 kg)  04/15/14 169 lb 12 oz (76.998 kg)  01/25/14 172 lb 12 oz (78.359 kg)    Physical Exam  Constitutional: He appears well-developed and well-nourished. No distress.  HENT:  Head: Normocephalic and atraumatic.  Right Ear: External ear normal.  Left Ear: External ear normal.  Nose: Nose normal.  Mouth/Throat: Oropharynx is clear and moist. No oropharyngeal exudate.  Eyes: Conjunctivae and EOM are normal. Pupils are equal, round, and reactive  to light. No scleral icterus.  Neck: Normal range of motion. Neck supple.  Cardiovascular: Normal rate, regular rhythm, normal heart sounds and intact distal pulses.   No murmur heard. Pulmonary/Chest: Effort normal and breath sounds normal. No respiratory distress. He has no wheezes. He has no rales.  Musculoskeletal: He exhibits no edema.  See HPI for foot exam if done  Lymphadenopathy:    He has no cervical adenopathy.  Skin: Skin is warm and dry. No rash noted.  Psychiatric: He has a normal mood and affect.  Nursing note and vitals reviewed.  Results for orders placed or performed in visit on  05/05/14  Hemoglobin A1c  Result Value Ref Range   Hgb A1c MFr Bld 8.4 (H) 4.6 - 6.5 %  Renal function panel  Result Value Ref Range   Sodium 139 135 - 145 mEq/L   Potassium 5.1 3.5 - 5.1 mEq/L   Chloride 105 96 - 112 mEq/L   CO2 30 19 - 32 mEq/L   Calcium 10.3 8.4 - 10.5 mg/dL   Albumin 4.4 3.5 - 5.2 g/dL   BUN 28 (H) 6 - 23 mg/dL   Creatinine, Ser 1.52 (H) 0.40 - 1.50 mg/dL   Glucose, Bld 191 (H) 70 - 99 mg/dL   Phosphorus 3.2 2.3 - 4.6 mg/dL   GFR 47.90 (L) >60.00 mL/min      Assessment & Plan:   Problem List Items Addressed This Visit    Uncontrolled type 2 diabetes mellitus with nephropathy    Over last week improvement noted in glycemic control - correlating with wife's stricter diabetic diet and lifestyle changes which have translated to positive change for him. Continue current regimen, advised to notify me if any recurrent low sugars of hypoglycemic symptoms.      Relevant Orders   Hemoglobin A1c (Completed)   Hypertension    bp better on recheck but still elevated with CKD. However brings log of great control at home. Will not make any changes today. F/u in 3 mo. Pt will continue monitoring bp closely at hoem. Satisfied with low dose amlodipine effect.      CKD (chronic kidney disease) stage 3, GFR 30-59 ml/min - Primary    Check renal panel today      Relevant Orders   Renal function panel (Completed)       Follow up plan: Return in about 3 months (around 08/05/2014), or if symptoms worsen or fail to improve, for follow up visit.

## 2014-05-05 NOTE — Assessment & Plan Note (Signed)
Check renal panel today. 

## 2014-05-05 NOTE — Patient Instructions (Addendum)
A1c today. Continue current insulin regimen. Watch for any more low sugars and let me know if this happens. No blood pressure med changes today. Keep watching at home. Return in 77mo for follow up visit, sooner if needed.

## 2014-05-06 ENCOUNTER — Encounter: Payer: Self-pay | Admitting: *Deleted

## 2014-05-19 ENCOUNTER — Other Ambulatory Visit: Payer: Self-pay | Admitting: Family Medicine

## 2014-05-19 NOTE — Telephone Encounter (Signed)
plz phone in. 

## 2014-05-19 NOTE — Telephone Encounter (Signed)
Ok to refill 

## 2014-05-19 NOTE — Telephone Encounter (Signed)
Rx called in as directed.   

## 2014-05-23 ENCOUNTER — Ambulatory Visit (INDEPENDENT_AMBULATORY_CARE_PROVIDER_SITE_OTHER): Payer: Medicare Other | Admitting: Family Medicine

## 2014-05-23 ENCOUNTER — Encounter: Payer: Self-pay | Admitting: Family Medicine

## 2014-05-23 VITALS — BP 150/78 | HR 60 | Temp 97.8°F | Wt 167.0 lb

## 2014-05-23 DIAGNOSIS — R42 Dizziness and giddiness: Secondary | ICD-10-CM

## 2014-05-23 DIAGNOSIS — H8111 Benign paroxysmal vertigo, right ear: Secondary | ICD-10-CM | POA: Diagnosis not present

## 2014-05-23 DIAGNOSIS — H8110 Benign paroxysmal vertigo, unspecified ear: Secondary | ICD-10-CM | POA: Insufficient documentation

## 2014-05-23 MED ORDER — LISINOPRIL 20 MG PO TABS: 20.0000 mg | ORAL_TABLET | Freq: Every day | ORAL | Status: DC

## 2014-05-23 NOTE — Patient Instructions (Addendum)
I think you have 2 separate issues going on - benign positional vertigo of right ear and overtreatment with blood pressure medicine Try exercises provided today for vertigo. Decrease lisinopril to 20mg  ONCE daily. Continue amloidipine 2.5mg  daily and carvedilol 6.25mg  twice daily. Call me with an update on blood pressures and dizziness next week.

## 2014-05-23 NOTE — Assessment & Plan Note (Signed)
Describes story for BPPV and dix hallpike positive for this - epley maneuver performed in office and pt tolerated well Advised against sudden head movements, recommended home modified epley and handout provided Update if not improving with this treatment. Not consistent with central cause today.

## 2014-05-23 NOTE — Progress Notes (Signed)
Pre visit review using our clinic review tool, if applicable. No additional management support is needed unless otherwise documented below in the visit note. 

## 2014-05-23 NOTE — Progress Notes (Addendum)
BP 150/78 mmHg  Pulse 60  Temp(Src) 97.8 F (36.6 C) (Oral)  Wt 167 lb (75.751 kg)   CC: dizziness  Subjective:    Patient ID: Scott [name], male    DOB: 29-Jun-1940, 74 y.o.   MRN: [id number]  HPI: Scott [name] is a 74 y.o. male presenting on 05/23/2014 for Dizziness   Ongoing dizziness over last month described as lightheaded - especially noticed early in the morning. This lasts 15-20 min. Had one episode of "room spinning" vertigo right when he lay down and turned his head to left. Slept in recliner. No vertigo. No recent viral illness. No hearing changes or fevers recently.  Amlodipine 2.5mg  daily was started 04/15/2014 with concern for hypertension. Continues lisinopril 20mg  bid and carvedilol 6.25mg  bid.   Lab Results  Component Value Date   TSH 1.66 01/21/2014    Brings long of bp and sugars bp - 99-150/50-60s, HR 50-70s. cbg - isolated lows to 50-60s and highs to >200. Overall 100s.  Relevant past medical, surgical, family and social history reviewed and updated as indicated. Interim medical history since our last visit reviewed. Allergies and medications reviewed and updated. Current Outpatient Prescriptions on File Prior to Visit  Medication Sig  . acetaminophen (TYLENOL) 500 MG tablet Take 1,000 mg by mouth every 6 (six) hours as needed for mild pain.   Marland Kitchen amLODipine (NORVASC) 2.5 MG tablet Take 1 tablet (2.5 mg total) by mouth daily.  Marland Kitchen aspirin 325 MG tablet Take 325 mg by mouth daily.  Marland Kitchen atorvastatin (LIPITOR) 80 MG tablet Take 1 tablet (80 mg total) by mouth daily.  . carvedilol (COREG) 6.25 MG tablet Take 1 tablet (6.25 mg total) by mouth 2 (two) times daily.  . furosemide (LASIX) 40 MG tablet Take 0.5 tablets (20 mg total) by mouth daily as needed. For fluid retention  . glucose blood test strip Use to check sugar twice daily or as directed. ONE TOUCH ULTRA BLUE Dx: E11.29  . Insulin Detemir (LEVEMIR FLEXTOUCH) 100 UNIT/ML Pen Inject 30 Units into the skin  every morning.   . insulin lispro (HUMALOG) 100 UNIT/ML KiwkPen Inject 5 Units into the skin 2 (two) times daily before a meal.   . levothyroxine (SYNTHROID, LEVOTHROID) 50 MCG tablet TAKE ONE TABLET BY MOUTH ONCE DAILY  . Multiple Vitamin (MULTIVITAMIN) tablet Take 1 tablet by mouth daily.  . niacin 500 MG CR capsule Take 2 capsules (1,000 mg total) by mouth at bedtime.  . nitroGLYCERIN (NITROSTAT) 0.4 MG SL tablet Place 1 tablet (0.4 mg total) under the tongue every 5 (five) minutes as needed for chest pain.  Marland Kitchen zolpidem (AMBIEN) 10 MG tablet TAKE ONE-HALF TABLET BY MOUTH AT BEDTIME   No current facility-administered medications on file prior to visit.    Review of Systems Per HPI unless specifically indicated above     Objective:    BP 150/78 mmHg  Pulse 60  Temp(Src) 97.8 F (36.6 C) (Oral)  Wt 167 lb (75.751 kg)  Wt Readings from Last 3 Encounters:  05/23/14 167 lb (75.751 kg)  05/05/14 168 lb 1.9 oz (76.259 kg)  04/15/14 169 lb 12 oz (76.998 kg)    Physical Exam  Constitutional: He appears well-developed and well-nourished. No distress.  HENT:  Right Ear: Tympanic membrane and ear canal normal.  Left Ear: Tympanic membrane and ear canal normal.  Mouth/Throat: Oropharynx is clear and moist. No oropharyngeal exudate.  Eyes: Conjunctivae and EOM are normal. Pupils are equal, round, and reactive  to light. No scleral icterus.  Cardiovascular: Normal rate, normal heart sounds and intact distal pulses.  An irregular rhythm present.  No murmur heard. Pulmonary/Chest: Effort normal and breath sounds normal. No respiratory distress. He has no wheezes. He has no rales.  Neurological:  CN 2-12 intact + dix hallpike on right epley canalith repositioning maneuver performed in office.  Nursing note and vitals reviewed.  Results for orders placed or performed in visit on 05/05/14  Hemoglobin A1c  Result Value Ref Range   Hgb A1c MFr Bld 8.4 (H) 4.6 - 6.5 %  Renal function panel    Result Value Ref Range   Sodium 139 135 - 145 mEq/L   Potassium 5.1 3.5 - 5.1 mEq/L   Chloride 105 96 - 112 mEq/L   CO2 30 19 - 32 mEq/L   Calcium 10.3 8.4 - 10.5 mg/dL   Albumin 4.4 3.5 - 5.2 g/dL   BUN 28 (H) 6 - 23 mg/dL   Creatinine, Ser 1.52 (H) 0.40 - 1.50 mg/dL   Glucose, Bld 191 (H) 70 - 99 mg/dL   Phosphorus 3.2 2.3 - 4.6 mg/dL   GFR 47.90 (L) >60.00 mL/min   Orthostatic vital signs positive today - supine 150/78 HR 60, standing 110/70 HR 74.    Assessment & Plan:   Problem List Items Addressed This Visit    Orthostatic dizziness    Anticipate from overtreatment given + orthostatics in office today. Will decrease lisinopril to 20mg  daily and continue amlodipine and carvedilol as previously prescribed. Call me in 2 wks with bp update. Pt agrees with plan.  EKG - sinus bradycardia rate 50s, normal axis, intervals, no acute ST/T changes      Relevant Orders   EKG 12-Lead (Completed)   BPV (benign positional vertigo) - Primary    Describes story for BPPV and dix hallpike positive for this - epley maneuver performed in office and pt tolerated well Advised against sudden head movements, recommended home modified epley and handout provided Update if not improving with this treatment. Not consistent with central cause today.          Follow up plan: Return if symptoms worsen or fail to improve.

## 2014-05-23 NOTE — Assessment & Plan Note (Addendum)
Anticipate from overtreatment given + orthostatics in office today. Will decrease lisinopril to 20mg  daily and continue amlodipine and carvedilol as previously prescribed. Call me in 2 wks with bp update. Pt agrees with plan.  EKG - sinus bradycardia rate 50s, normal axis, intervals, no acute ST/T changes

## 2014-05-24 ENCOUNTER — Ambulatory Visit: Payer: Medicare Other | Admitting: Cardiology

## 2014-05-27 ENCOUNTER — Encounter: Payer: Self-pay | Admitting: Cardiology

## 2014-05-27 ENCOUNTER — Ambulatory Visit (INDEPENDENT_AMBULATORY_CARE_PROVIDER_SITE_OTHER): Payer: Medicare Other | Admitting: Cardiology

## 2014-05-27 VITALS — BP 126/60 | HR 85 | Ht 73.0 in | Wt 167.2 lb

## 2014-05-27 DIAGNOSIS — E785 Hyperlipidemia, unspecified: Secondary | ICD-10-CM

## 2014-05-27 DIAGNOSIS — I10 Essential (primary) hypertension: Secondary | ICD-10-CM | POA: Diagnosis not present

## 2014-05-27 DIAGNOSIS — R072 Precordial pain: Secondary | ICD-10-CM

## 2014-05-27 NOTE — Progress Notes (Signed)
Patient ID: Scott [name], male   DOB: 1941-01-27, 74 y.o.   MRN: [id number]    Patient Name: Scott [name] Date of Encounter: 05/27/2014  Primary Care Provider:  [name], MD Primary Cardiologist: Dorothy Spark (Dr Verl Blalock)  Chief complain: 6 months follow up  Problem List   Past Medical History  Diagnosis Date  . Type 2 diabetes with nephropathy     DM refresher course [facility] (04/2013)  . Hypertension   . CKD (chronic kidney disease) stage 3, GFR 30-59 ml/min     [name]  . CAD (coronary artery disease) 10/10/2011    MI s/p PTCA (Dx-OM2 proximal concentric stenosis)  . Anemia 10/11/2011  . Bradycardia 10/10/2011  . Hypothyroidism 10/10/2011  . Thrombocytopenia 10/11/2011  . HLD (hyperlipidemia)   . Arthritis   . Generalized headaches     frequent  . Glaucoma     s/p laser surgery  . Colon polyps   . Acquired hand deformity 1962    hand saw accident at work   Past Surgical History  Procedure Laterality Date  . Back surgery      cervical neck  . Scarbro  . Colonoscopy  11/2008    1 polyp, diverticulosis, rec rpt 5 yrs (Dr. Oletta Lamas, [name])  . Eye surgery  2012    laser surgery for glaucoma  . US echocardiography  10/2013    inferior wall hypokinesis, mild LVH, EF 50-55%, mild MR and LA dilation    Allergies  No Known Allergies  HPI  Mr. [name] is a former patient of Dr Verl Blalock who underwent 2 cardiac caths in 1990 for chest pain and it is unclear if he underwent any PCI or stenting. He was doing well until 2013 when he was admitted for chest pain, ruled out for ACS and an exercise stress test was negative for ischemia or prior scar. His last echocardiogram showed EF of 55-60% with mild biatrial enlargement. There was no significant valvular heart disease. He was also no pericardial effusion.  He came after 2 years on 10/19/2013. He continued being very active, exercising daily. He had no limitations and no symptoms. He is very complaint with his medicines.    He was scheduled to come back after 1 year.   Only about 2 weeks later he was working daily with wood and developed typical exertional chest pain that was mildly improved by sl NTG. He went to the ED, ACS was ruled out. No chest pain since then. His ECG unchanged from prior. His BB was stopped in the ED because of bradycardia.  05/27/2014 - the patient is coming after 6 months, he denies any chest pain or SOB, no LE edema, orthopnea or claudications. No palpitations or syncope. He is complaint with his meds and denies any side effects.   Home Medications  Prior to Admission medications   Medication Sig Start Date End Date Taking? Authorizing Provider  acetaminophen (TYLENOL) 500 MG tablet Take 500 mg by mouth every 6 (six) hours as needed for pain.   Yes Historical Provider, MD  aspirin 325 MG tablet Take 325 mg by mouth daily.   Yes Historical Provider, MD  carvedilol (COREG) 25 MG tablet TAKE ONE TABLET BY MOUTH TWICE DAILY WITH A MEAL 09/13/13  Yes [name], MD  CRESTOR 20 MG tablet TAKE ONE TABLET BY MOUTH AT BEDTIME   Yes [name], MD  furosemide (LASIX) 40 MG tablet Take 0.5 tablets (20 mg total) by mouth daily  as needed. For fluid retention 12/03/11  Yes [name], MD  glucose blood test strip Use to test blood sugar twice daily or as directed. Dx 250.40,  583.81 11/26/12  Yes [name], MD  Insulin Aspart Prot & Aspart (NOVOLOG MIX 70/30 FLEXPEN) (70-30) 100 UNIT/ML Pen INJECT 19 UNITS SUBCUTANEOUSLY IN THE MORNING AND 24 UNITS AT NIGHT 09/20/13  Yes [name], MD  levothyroxine (SYNTHROID, LEVOTHROID) 50 MCG tablet Take 1 tablet (50 mcg total) by mouth daily. 11/06/12  Yes [name], MD  lisinopril (PRINIVIL,ZESTRIL) 20 MG tablet Take 1 tablet (20 mg total) by mouth 2 (two) times daily. 11/06/12  Yes [name], MD  Multiple Vitamin (MULTIVITAMIN) tablet Take 1 tablet by mouth daily.   Yes Historical Provider, MD  niacin 500 MG CR capsule Take 2  capsules (1,000 mg total) by mouth at bedtime. 02/03/12  Yes [name], MD  nitroGLYCERIN (NITROSTAT) 0.4 MG SL tablet Place 1 tablet (0.4 mg total) under the tongue every 5 (five) minutes as needed for chest pain. 10/11/11 11/07/14 Yes Rhonda G Barrett, PA-C  zolpidem (AMBIEN) 10 MG tablet TAKE ONE-HALF TABLET BY MOUTH AT BEDTIME   Yes [name], MD    Family History  Family History  Problem Relation Age of Onset  . Coronary artery disease Son 3    5v CABG and stents  . Diabetes Sister   . Stomach cancer Sister 40  . Hyperlipidemia Sister   . Stroke Neg Hx   . Pneumonia Father     caused death  . Other Brother     no communication with brother so unsure of any health conditions    Social History  History   Social History  . Marital Status: Married    Spouse Name: N/A  . Number of Children: N/A  . Years of Education: N/A   Occupational History  . Not on file.   Social History Main Topics  . Smoking status: Former Smoker    Quit date: 03/04/1978  . Smokeless tobacco: Never Used  . Alcohol Use: No  . Drug Use: No  . Sexual Activity: Not on file   Other Topics Concern  . Not on file   Social History Narrative   Lives with wife, 1 dog   Occupation: retired, was route Hotel manager   Edu: 11th gr   Activity: walks dog 3-4x/day, yardwork, rides bicycle.   Diet: drinks diet coke, no vegetables     Review of Systems, as per HPI, otherwise negative General:  No chills, fever, night sweats or weight changes.  Cardiovascular:  No chest pain, dyspnea on exertion, edema, orthopnea, palpitations, paroxysmal nocturnal dyspnea. Dermatological: No rash, lesions/masses Respiratory: No cough, dyspnea Urologic: No hematuria, dysuria Abdominal:   No nausea, vomiting, diarrhea, bright red blood per rectum, melena, or hematemesis Neurologic:  No visual changes, wkns, changes in mental status. All other systems reviewed and are otherwise negative except as noted  above.  Physical Exam  Blood pressure 126/60, pulse 85, height 6\' 1"  (1.854 m), weight 167 lb 3.2 oz (75.841 kg).  General: Pleasant, NAD Psych: Normal affect. Neuro: Alert and oriented X 3. Moves all extremities spontaneously. HEENT: Normal  Neck: Supple without bruits or JVD. Lungs:  Resp regular and unlabored, CTA. Heart: RRR no s3, s4, or murmurs. Abdomen: Soft, non-tender, non-distended, BS + x 4.  Extremities: No clubbing, cyanosis or edema. DP/PT/Radials 2+ and equal bilaterally.  Labs:  No results for input(s): CKTOTAL, CKMB, TROPONINI in the last 72  hours. Lab Results  Component Value Date   WBC 7.3 01/21/2014   HGB 12.0* 01/21/2014   HCT 36.3* 01/21/2014   MCV 91.6 01/21/2014   PLT 114.0* 01/21/2014    No results found for: DDIMER Invalid input(s): POCBNP    Component Value Date/Time   NA 139 05/05/2014 1034   NA 140 09/04/2011   K 5.1 05/05/2014 1034   K 4.4 09/04/2011   CL 105 05/05/2014 1034   CO2 30 05/05/2014 1034   GLUCOSE 191* 05/05/2014 1034   BUN 28* 05/05/2014 1034   CREATININE 1.52* 05/05/2014 1034   CREATININE 1.6* 10/16/2013   CREATININE 1.9 09/04/2011   CALCIUM 10.3 05/05/2014 1034   PROT 6.7 09/04/2011   ALBUMIN 4.4 05/05/2014 1034   AST 18 09/04/2011   ALT 23 09/04/2011   ALKPHOS 94 09/04/2011   BILITOT 0.6 09/04/2011   GFRNONAA 40* 10/26/2013 0703   GFRAA 46* 10/26/2013 0703   Lab Results  Component Value Date   CHOL 154 01/21/2014   HDL 60.60 01/21/2014   LDLCALC 81 01/21/2014   TRIG 63.0 01/21/2014    Accessory Clinical Findings  Echocardiogram - 2013 Study Conclusions  - Left ventricle: The cavity size was normal. Wall thickness was normal. The estimated ejection fraction was 55%. Wall motion was normal; there were no regional wall motion abnormalities. Features are consistent with a pseudonormal left ventricular filling pattern, with concomitant abnormal relaxation and increased filling pressure (grade 2 diastolic  dysfunction). - Aortic valve: There was no stenosis. - Mitral valve: Trivial regurgitation. - Left atrium: The atrium was mildly dilated. - Right ventricle: The cavity size was normal. Systolic function was normal. - Right atrium: The atrium was mildly dilated. - Tricuspid valve: Peak RV-RA gradient: 83mm Hg (S). - Systemic veins: IVC not visualized. Impressions:  - Normal LV size and systolic function, EF 24%. Moderate diastoilc dysfunction. Normal RV size and systolic function. No significant valvular dysfunction.  ECG - Sinus bradycardia 59 BPM, otherwise normal ECG  Echo 10/26/2013 - Left ventricle: Inferior wall hypokinesis. The cavity size was normal. Wall thickness was increased in a pattern of mild LVH. Systolic function was normal. The estimated ejection fraction was in the range of 50% to 55%. - Mitral valve: There was mild regurgitation. - Left atrium: The atrium was mildly dilated. - Atrial septum: No defect or patent foramen ovale was identified.  ECG: SR, old inferior MI, otherwise normal  Lexiscan nuclear stress test: 11/24/2014 Impression Exercise Capacity: Lexiscan with low level exercise. BP Response: Normal blood pressure response. Clinical Symptoms: Anxiety, shortness of breath ECG Impression: No significant ST segment change suggestive of ischemia. Comparison with Prior Nuclear Study: No images to compare  Overall Impression: Low risk stress nuclear study with no significant ischemia identified. Perfusion images suggestive of left ventricular hypertrophy..  LV Ejection Fraction: 58%. LV Wall Motion: NL LV Function; NL Wall Motion  SKAINS, MARK, MD   Assessment & Plan  1.  CAD, ? Prior intervention, normal Lexiscan stress test in 11/2014, no further work up.  Continue  ASA, moderate dose rosuvastatin, lisinopril, restart lower dose of carvedilol. Preserved LVEF.  2. HTN - finally controlled with starting  of carvedilol 6.25 mg PO BID.  3.  Hyperlipidemia - on atorvastatin and niacin, all lipids at goal, on 05/05/14 LDL 81, TG 63.  4. Preoperative evaluation for colonoscopy - no contraindication.   Follow up in 6 months.  Dorothy Spark, MD, [facility] [place] 05/27/2014, 11:34 AM

## 2014-05-27 NOTE — Patient Instructions (Signed)
Your physician recommends that you continue on your current medications as directed. Please refer to the Current Medication list given to you today.     Your physician wants you to follow-up in: 6 MONTHS WITH DR NELSON You will receive a reminder letter in the mail two months in advance. If you don't receive a letter, please call our office to schedule the follow-up appointment.  

## 2014-06-03 ENCOUNTER — Telehealth: Payer: Self-pay | Admitting: Family Medicine

## 2014-06-03 HISTORY — PX: COLONOSCOPY: SHX174

## 2014-06-03 NOTE — Telephone Encounter (Signed)
Message left advising patient.  

## 2014-06-03 NOTE — Telephone Encounter (Signed)
Reviewed log of BP and cbgs pt brings. BP: 100-130/50-60s, HR 50-70s cbg: isolated low to 51. Otherwise 90-200. Dizziness improving.   Continue current regimen of lisinopril 20mg , amlodipine 2.5mg  and carvedilol 6.25mg  bid.

## 2014-06-08 ENCOUNTER — Other Ambulatory Visit: Payer: Self-pay | Admitting: Gastroenterology

## 2014-06-08 DIAGNOSIS — Z86010 Personal history of colonic polyps: Secondary | ICD-10-CM | POA: Diagnosis not present

## 2014-06-08 DIAGNOSIS — K641 Second degree hemorrhoids: Secondary | ICD-10-CM | POA: Diagnosis not present

## 2014-06-08 DIAGNOSIS — D122 Benign neoplasm of ascending colon: Secondary | ICD-10-CM | POA: Diagnosis not present

## 2014-06-08 DIAGNOSIS — D124 Benign neoplasm of descending colon: Secondary | ICD-10-CM | POA: Diagnosis not present

## 2014-06-08 DIAGNOSIS — Z09 Encounter for follow-up examination after completed treatment for conditions other than malignant neoplasm: Secondary | ICD-10-CM | POA: Diagnosis not present

## 2014-06-08 DIAGNOSIS — K635 Polyp of colon: Secondary | ICD-10-CM | POA: Diagnosis not present

## 2014-06-08 LAB — HM COLONOSCOPY

## 2014-06-16 DIAGNOSIS — I129 Hypertensive chronic kidney disease with stage 1 through stage 4 chronic kidney disease, or unspecified chronic kidney disease: Secondary | ICD-10-CM | POA: Diagnosis not present

## 2014-06-16 DIAGNOSIS — N183 Chronic kidney disease, stage 3 (moderate): Secondary | ICD-10-CM | POA: Diagnosis not present

## 2014-06-16 DIAGNOSIS — R809 Proteinuria, unspecified: Secondary | ICD-10-CM | POA: Diagnosis not present

## 2014-06-16 DIAGNOSIS — E1129 Type 2 diabetes mellitus with other diabetic kidney complication: Secondary | ICD-10-CM | POA: Diagnosis not present

## 2014-06-23 ENCOUNTER — Other Ambulatory Visit: Payer: Self-pay | Admitting: Cardiology

## 2014-06-24 NOTE — Telephone Encounter (Signed)
Per Dr Meda Coffee Note on 3.25.16

## 2014-07-14 ENCOUNTER — Encounter: Payer: Self-pay | Admitting: Family Medicine

## 2014-07-18 DIAGNOSIS — Z0289 Encounter for other administrative examinations: Secondary | ICD-10-CM

## 2014-07-21 ENCOUNTER — Other Ambulatory Visit: Payer: Self-pay | Admitting: Family Medicine

## 2014-07-21 ENCOUNTER — Encounter: Payer: Self-pay | Admitting: Family Medicine

## 2014-07-21 NOTE — Telephone Encounter (Signed)
plz phone in. 

## 2014-07-21 NOTE — Telephone Encounter (Signed)
Rx called in as directed.   

## 2014-08-08 ENCOUNTER — Encounter: Payer: Self-pay | Admitting: Family Medicine

## 2014-08-08 ENCOUNTER — Ambulatory Visit (INDEPENDENT_AMBULATORY_CARE_PROVIDER_SITE_OTHER): Payer: Medicare Other | Admitting: Family Medicine

## 2014-08-08 VITALS — BP 132/74 | HR 56 | Temp 97.3°F | Wt 163.2 lb

## 2014-08-08 DIAGNOSIS — N183 Chronic kidney disease, stage 3 unspecified: Secondary | ICD-10-CM

## 2014-08-08 DIAGNOSIS — E1165 Type 2 diabetes mellitus with hyperglycemia: Secondary | ICD-10-CM | POA: Diagnosis not present

## 2014-08-08 DIAGNOSIS — E1129 Type 2 diabetes mellitus with other diabetic kidney complication: Secondary | ICD-10-CM

## 2014-08-08 DIAGNOSIS — I10 Essential (primary) hypertension: Secondary | ICD-10-CM | POA: Diagnosis not present

## 2014-08-08 DIAGNOSIS — E1121 Type 2 diabetes mellitus with diabetic nephropathy: Secondary | ICD-10-CM

## 2014-08-08 DIAGNOSIS — IMO0002 Reserved for concepts with insufficient information to code with codable children: Secondary | ICD-10-CM

## 2014-08-08 LAB — RENAL FUNCTION PANEL
Albumin: 4 g/dL (ref 3.5–5.2)
BUN: 28 mg/dL — AB (ref 6–23)
CO2: 30 mEq/L (ref 19–32)
Calcium: 9.6 mg/dL (ref 8.4–10.5)
Chloride: 107 mEq/L (ref 96–112)
Creatinine, Ser: 1.32 mg/dL (ref 0.40–1.50)
GFR: 56.32 mL/min — ABNORMAL LOW (ref >60.00)
Glucose, Bld: 156 mg/dL — ABNORMAL HIGH (ref 70–99)
Phosphorus: 2.7 mg/dL (ref 2.3–4.6)
Potassium: 4.6 mEq/L (ref 3.5–5.1)
Sodium: 140 mEq/L (ref 135–145)

## 2014-08-08 LAB — HEMOGLOBIN A1C: Hgb A1c MFr Bld: 7.7 % — ABNORMAL HIGH (ref 4.6–6.5)

## 2014-08-08 NOTE — Assessment & Plan Note (Signed)
Chronic, stable. Continue current regimen. 

## 2014-08-08 NOTE — Assessment & Plan Note (Addendum)
Adequate control, discussed low sugars (tend to happen when he waits between novolog shot and meal). Check A1c today. Goal <8% Foot exam today.

## 2014-08-08 NOTE — Progress Notes (Signed)
Pre visit review using our clinic review tool, if applicable. No additional management support is needed unless otherwise documented below in the visit note. 

## 2014-08-08 NOTE — Progress Notes (Signed)
BP 132/74 mmHg  Pulse 56  Temp(Src) 97.3 F (36.3 C) (Oral)  Wt 163 lb 4 oz (74.05 kg)   CC: f/u visit  Subjective:    Patient ID: Scott [name], male    DOB: February 04, 1941, 74 y.o.   MRN: [id number]  HPI: Scott [name] is a 74 y.o. male presenting on 08/08/2014 for Follow-up   Stressed - wife in hospital and now in rehab. Has been very busy taking care of house and wife. Walks dog regularly.  DM - regularly does check sugars: 70-200.  Compliant with antihyperglycemic regimen which includes: levemir 30u QAM and humalog 5u BID AC.  Some low sugars and hypoglycemic symptoms (only once or twice). Denies paresthesias. Last diabetic eye exam 08/04/2013.  Pneumovax: 2015.  Prevnar: 2013. Lab Results  Component Value Date   HGBA1C 8.4* 05/05/2014   Diabetic Foot Exam - Simple   Simple Foot Form  Diabetic Foot exam was performed with the following findings:  Yes 08/08/2014  9:29 AM  Visual Inspection  No deformities, no ulcerations, no other skin breakdown bilaterally:  Yes  Sensation Testing  Intact to touch and monofilament testing bilaterally:  Yes  Pulse Check  Posterior Tibialis and Dorsalis pulse intact bilaterally:  Yes  Comments      HTN - compliant with current regimen. Lisinopril recently decreased to 20mg  once daily 2/2 orthostatic dizziness. bp stable today. Denies any further dizzy episodes. he also had BPPV. Compliant with amlodipine 2.5mg  daily, carvedilol 6.25mg  bid, lasix 20mg  prn, lisinopril 20mg  daily.  CKD - bp stable.  Lab Results  Component Value Date   CREATININE 1.52* 05/05/2014     Relevant past medical, surgical, family and social history reviewed and updated as indicated. Interim medical history since our last visit reviewed. Allergies and medications reviewed and updated. Current Outpatient Prescriptions on File Prior to Visit  Medication Sig  . acetaminophen (TYLENOL) 500 MG tablet Take 1,000 mg by mouth every 6 (six) hours as needed for mild pain.   Marland Kitchen  amLODipine (NORVASC) 2.5 MG tablet Take 1 tablet (2.5 mg total) by mouth daily.  Marland Kitchen aspirin 325 MG tablet Take 325 mg by mouth daily.  Marland Kitchen atorvastatin (LIPITOR) 80 MG tablet Take 1 tablet (80 mg total) by mouth daily.  . carvedilol (COREG) 6.25 MG tablet TAKE ONE TABLET BY MOUTH TWICE DAILY  . furosemide (LASIX) 40 MG tablet Take 0.5 tablets (20 mg total) by mouth daily as needed. For fluid retention  . glucose blood test strip Use to check sugar twice daily or as directed. ONE TOUCH ULTRA BLUE Dx: E11.29  . Insulin Detemir (LEVEMIR FLEXTOUCH) 100 UNIT/ML Pen Inject 30 Units into the skin every morning.   . insulin lispro (HUMALOG) 100 UNIT/ML KiwkPen Inject 5 Units into the skin 2 (two) times daily before a meal.   . levothyroxine (SYNTHROID, LEVOTHROID) 50 MCG tablet TAKE ONE TABLET BY MOUTH ONCE DAILY  . lisinopril (PRINIVIL,ZESTRIL) 20 MG tablet Take 1 tablet (20 mg total) by mouth daily.  . nitroGLYCERIN (NITROSTAT) 0.4 MG SL tablet Place 0.4 mg under the tongue every 5 (five) minutes as needed for chest pain ((MAX of 3 doses)).  Marland Kitchen zolpidem (AMBIEN) 10 MG tablet TAKE ONE-HALF TABLET BY MOUTH AT BEDTIME   No current facility-administered medications on file prior to visit.    Review of Systems Per HPI unless specifically indicated above     Objective:    BP 132/74 mmHg  Pulse 56  Temp(Src) 97.3 F (36.3  C) (Oral)  Wt 163 lb 4 oz (74.05 kg)  Wt Readings from Last 3 Encounters:  08/08/14 163 lb 4 oz (74.05 kg)  05/27/14 167 lb 3.2 oz (75.841 kg)  05/23/14 167 lb (75.751 kg)    Physical Exam  Constitutional: He appears well-developed and well-nourished. No distress.  HENT:  Head: Normocephalic and atraumatic.  Right Ear: External ear normal.  Left Ear: External ear normal.  Nose: Nose normal.  Mouth/Throat: Oropharynx is clear and moist. No oropharyngeal exudate.  Eyes: Conjunctivae and EOM are normal. Pupils are equal, round, and reactive to light. No scleral icterus.  Neck:  Normal range of motion. Neck supple.  Cardiovascular: Normal rate, regular rhythm, normal heart sounds and intact distal pulses.   No murmur heard. Pulmonary/Chest: Effort normal and breath sounds normal. No respiratory distress. He has no wheezes. He has no rales.  Musculoskeletal: He exhibits no edema.  See HPI for foot exam if done  Lymphadenopathy:    He has no cervical adenopathy.  Skin: Skin is warm and dry. No rash noted.  Psychiatric: He has a normal mood and affect.  Nursing note and vitals reviewed.  Results for orders placed or performed in visit on 07/14/14  HM COLONOSCOPY  Result Value Ref Range   HM Colonoscopy hyperplastic polyps rpt 5 yrs       Assessment & Plan:   Problem List Items Addressed This Visit    CKD (chronic kidney disease) stage 3, GFR 30-59 ml/min    Check renal panel today. Aware of importance of good hydration. Avoid NSAIDs and other nephrotoxic agents.      Relevant Orders   Renal function panel   Hypertension    Chronic, stable. Continue current regimen.      Relevant Medications   niacin (NIASPAN) 500 MG CR tablet   Uncontrolled type 2 diabetes mellitus with nephropathy - Primary    Adequate control, discussed low sugars (tend to happen when he waits between novolog shot and meal). Check A1c today. Goal <8% Foot exam today.      Relevant Orders   Hemoglobin A1c       Follow up plan: Return in about 6 months (around 02/07/2015), or as needed, for medicare wellness visit.

## 2014-08-08 NOTE — Assessment & Plan Note (Signed)
Check renal panel today. Aware of importance of good hydration. Avoid NSAIDs and other nephrotoxic agents.

## 2014-08-08 NOTE — Patient Instructions (Addendum)
Good to see you today, call us with questions. Return as needed or in 6 months for medicare wellness visit Blood work today. You are doing well today.

## 2014-08-09 ENCOUNTER — Encounter: Payer: Self-pay | Admitting: *Deleted

## 2014-08-24 ENCOUNTER — Other Ambulatory Visit: Payer: Self-pay | Admitting: Family Medicine

## 2014-08-24 NOTE — Telephone Encounter (Signed)
Ok to refill 

## 2014-08-24 NOTE — Telephone Encounter (Signed)
plz phone in. 

## 2014-08-24 NOTE — Telephone Encounter (Signed)
Rx called in as directed.   

## 2014-08-31 DIAGNOSIS — H2513 Age-related nuclear cataract, bilateral: Secondary | ICD-10-CM | POA: Diagnosis not present

## 2014-08-31 DIAGNOSIS — H02831 Dermatochalasis of right upper eyelid: Secondary | ICD-10-CM | POA: Diagnosis not present

## 2014-08-31 DIAGNOSIS — H02834 Dermatochalasis of left upper eyelid: Secondary | ICD-10-CM | POA: Diagnosis not present

## 2014-08-31 DIAGNOSIS — H4011X2 Primary open-angle glaucoma, moderate stage: Secondary | ICD-10-CM | POA: Diagnosis not present

## 2014-09-07 DIAGNOSIS — H4011X2 Primary open-angle glaucoma, moderate stage: Secondary | ICD-10-CM | POA: Diagnosis not present

## 2014-09-28 DIAGNOSIS — H4011X1 Primary open-angle glaucoma, mild stage: Secondary | ICD-10-CM | POA: Diagnosis not present

## 2014-10-19 ENCOUNTER — Other Ambulatory Visit: Payer: Self-pay | Admitting: Family Medicine

## 2014-11-03 LAB — HM DIABETES EYE EXAM

## 2014-11-09 DIAGNOSIS — H2513 Age-related nuclear cataract, bilateral: Secondary | ICD-10-CM | POA: Diagnosis not present

## 2014-11-09 DIAGNOSIS — H02834 Dermatochalasis of left upper eyelid: Secondary | ICD-10-CM | POA: Diagnosis not present

## 2014-11-09 DIAGNOSIS — H10413 Chronic giant papillary conjunctivitis, bilateral: Secondary | ICD-10-CM | POA: Diagnosis not present

## 2014-11-09 DIAGNOSIS — H02831 Dermatochalasis of right upper eyelid: Secondary | ICD-10-CM | POA: Diagnosis not present

## 2014-11-23 ENCOUNTER — Encounter: Payer: Self-pay | Admitting: Cardiology

## 2014-11-23 ENCOUNTER — Ambulatory Visit (INDEPENDENT_AMBULATORY_CARE_PROVIDER_SITE_OTHER): Payer: Medicare Other | Admitting: Cardiology

## 2014-11-23 ENCOUNTER — Other Ambulatory Visit: Payer: Self-pay | Admitting: Family Medicine

## 2014-11-23 VITALS — BP 140/70 | HR 46 | Ht 73.0 in | Wt 168.4 lb

## 2014-11-23 DIAGNOSIS — I10 Essential (primary) hypertension: Secondary | ICD-10-CM

## 2014-11-23 DIAGNOSIS — I2583 Coronary atherosclerosis due to lipid rich plaque: Secondary | ICD-10-CM

## 2014-11-23 DIAGNOSIS — I2510 Atherosclerotic heart disease of native coronary artery without angina pectoris: Secondary | ICD-10-CM | POA: Diagnosis not present

## 2014-11-23 DIAGNOSIS — E785 Hyperlipidemia, unspecified: Secondary | ICD-10-CM

## 2014-11-23 NOTE — Progress Notes (Signed)
Patient ID: JIE STICKELS, male   DOB: 09/25/1940, 74 y.o.   MRN: [id number]    Patient Name: Scott [name] Date of Encounter: 11/23/2014  Primary Care Provider:  [name], MD Primary Cardiologist: Dorothy Spark (Dr Verl Blalock)  Chief complain: 6 months follow up  Problem List   Past Medical History  Diagnosis Date  . Type 2 diabetes with nephropathy     DM refresher course [facility] (04/2013)  . Hypertension   . CKD (chronic kidney disease) stage 3, GFR 30-59 ml/min     [name]  . CAD (coronary artery disease) 10/10/2011    MI s/p PTCA (Dx-OM2 proximal concentric stenosis)  . Anemia 10/11/2011  . Bradycardia 10/10/2011  . Hypothyroidism 10/10/2011  . Thrombocytopenia 10/11/2011  . HLD (hyperlipidemia)   . Arthritis   . Generalized headaches     frequent  . Glaucoma     s/p laser surgery  . Colon polyps   . Acquired hand deformity 1962    hand saw accident at work   Past Surgical History  Procedure Laterality Date  . Back surgery      cervical neck  . [place]  . Colonoscopy  11/2008    1 polyp, diverticulosis, rec rpt 5 yrs (Dr. Oletta Lamas, [name])  . Eye surgery  2012    laser surgery for glaucoma  . US echocardiography  10/2013    inferior wall hypokinesis, mild LVH, EF 50-55%, mild MR and LA dilation  . Colonoscopy  06/2014    hyperplastic polyp, rpt 5 yrs Oletta Lamas)    Allergies  No Known Allergies  HPI  Mr. [name] is a former patient of Dr Verl Blalock who underwent 2 cardiac caths in 1990 for chest pain and it is unclear if he underwent any PCI or stenting. He was doing well until 2013 when he was admitted for chest pain, ruled out for ACS and an exercise stress test was negative for ischemia or prior scar. His last echocardiogram showed EF of 55-60% with mild biatrial enlargement. There was no significant valvular heart disease. He was also no pericardial effusion.  He came after 2 years on 10/19/2013. He continued being very active, exercising daily. He had no  limitations and no symptoms. He is very complaint with his medicines.  He was scheduled to come back after 1 year.   Only about 2 weeks later he was working daily with wood and developed typical exertional chest pain that was mildly improved by sl NTG. He went to the ED, ACS was ruled out. No chest pain since then. His ECG unchanged from prior. His BB was stopped in the ED because of bradycardia.  11/23/2014 - the patient is coming after 6 months, he denies any chest pain or SOB, no LE edema, orthopnea or claudications. No palpitations or syncope. He is complaint with his meds and denies any side effects. He bruises easily. He brings BP log and his BP runs in 95-140' range, controlled most of the time. He works about 4 hours daily in his yard. No symptoms.  Home Medications  Prior to Admission medications   Medication Sig Start Date End Date Taking? Authorizing Provider  acetaminophen (TYLENOL) 500 MG tablet Take 500 mg by mouth every 6 (six) hours as needed for pain.   Yes Historical Provider, MD  aspirin 325 MG tablet Take 325 mg by mouth daily.   Yes Historical Provider, MD  carvedilol (COREG) 25 MG tablet TAKE ONE TABLET BY MOUTH TWICE DAILY  WITH A MEAL 09/13/13  Yes [name], MD  CRESTOR 20 MG tablet TAKE ONE TABLET BY MOUTH AT BEDTIME   Yes [name], MD  furosemide (LASIX) 40 MG tablet Take 0.5 tablets (20 mg total) by mouth daily as needed. For fluid retention 12/03/11  Yes [name], MD  glucose blood test strip Use to test blood sugar twice daily or as directed. Dx 250.40,  583.81 11/26/12  Yes [name], MD  Insulin Aspart Prot & Aspart (NOVOLOG MIX 70/30 FLEXPEN) (70-30) 100 UNIT/ML Pen INJECT 19 UNITS SUBCUTANEOUSLY IN THE MORNING AND 24 UNITS AT NIGHT 09/20/13  Yes [name], MD  levothyroxine (SYNTHROID, LEVOTHROID) 50 MCG tablet Take 1 tablet (50 mcg total) by mouth daily. 11/06/12  Yes [name], MD  lisinopril (PRINIVIL,ZESTRIL) 20 MG tablet  Take 1 tablet (20 mg total) by mouth 2 (two) times daily. 11/06/12  Yes [name], MD  Multiple Vitamin (MULTIVITAMIN) tablet Take 1 tablet by mouth daily.   Yes Historical Provider, MD  niacin 500 MG CR capsule Take 2 capsules (1,000 mg total) by mouth at bedtime. 02/03/12  Yes [name], MD  nitroGLYCERIN (NITROSTAT) 0.4 MG SL tablet Place 1 tablet (0.4 mg total) under the tongue every 5 (five) minutes as needed for chest pain. 10/11/11 11/07/14 Yes Rhonda G Barrett, PA-C  zolpidem (AMBIEN) 10 MG tablet TAKE ONE-HALF TABLET BY MOUTH AT BEDTIME   Yes [name], MD    Family History  Family History  Problem Relation Age of Onset  . Coronary artery disease Son 39    5v CABG and stents  . Diabetes Sister   . Stomach cancer Sister 42  . Hyperlipidemia Sister   . Stroke Neg Hx   . Pneumonia Father     caused death  . Other Brother     no communication with brother so unsure of any health conditions    Social History  Social History   Social History  . Marital Status: Married    Spouse Name: N/A  . Number of Children: N/A  . Years of Education: N/A   Occupational History  . Not on file.   Social History Main Topics  . Smoking status: Former Smoker    Quit date: 03/04/1978  . Smokeless tobacco: Never Used  . Alcohol Use: No  . Drug Use: No  . Sexual Activity: Not on file   Other Topics Concern  . Not on file   Social History Narrative   Lives with wife, 1 dog   Occupation: retired, was route Hotel manager   Edu: 11th gr   Activity: walks dog (pomeranian) 3-4x/day, yardwork, rides bicycle.   Diet: drinks diet coke, no vegetables     Review of Systems, as per HPI, otherwise negative General:  No chills, fever, night sweats or weight changes.  Cardiovascular:  No chest pain, dyspnea on exertion, edema, orthopnea, palpitations, paroxysmal nocturnal dyspnea. Dermatological: No rash, lesions/masses Respiratory: No cough, dyspnea Urologic: No hematuria,  dysuria Abdominal:   No nausea, vomiting, diarrhea, bright red blood per rectum, melena, or hematemesis Neurologic:  No visual changes, wkns, changes in mental status. All other systems reviewed and are otherwise negative except as noted above.  Physical Exam  Blood pressure 140/70, pulse 46, height 6\' 1"  (1.854 m), weight 168 lb 6.4 oz (76.386 kg), SpO2 98 %.  General: Pleasant, NAD Psych: Normal affect. Neuro: Alert and oriented X 3. Moves all extremities spontaneously. HEENT: Normal  Neck: Supple without bruits or JVD. Lungs:  Resp regular and unlabored, CTA. Heart: RRR no s3, s4, or murmurs. Abdomen: Soft, non-tender, non-distended, BS + x 4.  Extremities: No clubbing, cyanosis or edema. DP/PT/Radials 2+ and equal bilaterally.  Labs:  No results for input(s): CKTOTAL, CKMB, TROPONINI in the last 72 hours. Lab Results  Component Value Date   WBC 7.3 01/21/2014   HGB 12.0* 01/21/2014   HCT 36.3* 01/21/2014   MCV 91.6 01/21/2014   PLT 114.0* 01/21/2014    No results found for: DDIMER Invalid input(s): POCBNP    Component Value Date/Time   NA 140 08/08/2014 0951   NA 140 09/04/2011   K 4.6 08/08/2014 0951   K 4.4 09/04/2011   CL 107 08/08/2014 0951   CO2 30 08/08/2014 0951   GLUCOSE 156* 08/08/2014 0951   BUN 28* 08/08/2014 0951   CREATININE 1.32 08/08/2014 0951   CREATININE 1.6* 10/16/2013   CREATININE 1.9 09/04/2011   CALCIUM 9.6 08/08/2014 0951   PROT 6.7 09/04/2011   ALBUMIN 4.0 08/08/2014 0951   AST 18 09/04/2011   ALT 23 09/04/2011   ALKPHOS 94 09/04/2011   BILITOT 0.6 09/04/2011   GFRNONAA 40* 10/26/2013 0703   GFRAA 46* 10/26/2013 0703   Lab Results  Component Value Date   CHOL 154 01/21/2014   HDL 60.60 01/21/2014   LDLCALC 81 01/21/2014   TRIG 63.0 01/21/2014    Accessory Clinical Findings  Echocardiogram - 2013 Study Conclusions  - Left ventricle: The cavity size was normal. Wall thickness was normal. The estimated ejection fraction was  55%. Wall motion was normal; there were no regional wall motion abnormalities. Features are consistent with a pseudonormal left ventricular filling pattern, with concomitant abnormal relaxation and increased filling pressure (grade 2 diastolic dysfunction). - Aortic valve: There was no stenosis. - Mitral valve: Trivial regurgitation. - Left atrium: The atrium was mildly dilated. - Right ventricle: The cavity size was normal. Systolic function was normal. - Right atrium: The atrium was mildly dilated. - Tricuspid valve: Peak RV-RA gradient: 62mm Hg (S). - Systemic veins: IVC not visualized. Impressions:  - Normal LV size and systolic function, EF 37%. Moderate diastoilc dysfunction. Normal RV size and systolic function. No significant valvular dysfunction.  ECG - Sinus bradycardia 59 BPM, otherwise normal ECG  Echo 10/26/2013 - Left ventricle: Inferior wall hypokinesis. The cavity size was normal. Wall thickness was increased in a pattern of mild LVH. Systolic function was normal. The estimated ejection fraction was in the range of 50% to 55%. - Mitral valve: There was mild regurgitation. - Left atrium: The atrium was mildly dilated. - Atrial septum: No defect or patent foramen ovale was identified.  ECG: SR, old inferior MI, otherwise normal  Lexiscan nuclear stress test: 11/24/2014 Impression Exercise Capacity: Lexiscan with low level exercise. BP Response: Normal blood pressure response. Clinical Symptoms: Anxiety, shortness of breath ECG Impression: No significant ST segment change suggestive of ischemia. Comparison with Prior Nuclear Study: No images to compare  Overall Impression: Low risk stress nuclear study with no significant ischemia identified. Perfusion images suggestive of left ventricular hypertrophy.. LV Ejection Fraction: 58%. LV Wall Motion: NL LV Function; NL Wall Motion  SKAINS, MARK, MD  ECG: Sinus bradycardia, otherwise normal ECG, unchanged  from prior   Assessment & Plan  1.  CAD, ? Prior intervention, normal Lexiscan stress test in 11/2013, no further work up.  Continue  ASA, moderate dose rosuvastatin, lisinopril, restart lower dose of carvedilol. Preserved LVEF.  2. HTN - controlled, continue the same  meds. BP controlled most of the time, gets to 90', so no increase in BP meds.  3. Hyperlipidemia - on atorvastatin and niacin, all lipids at goal, on 05/05/14 LDL 81, TG 63.  4. Sinus bradycardia - asymptomatic, we will continue carvedilol  Follow up in 1 year.  Dorothy Spark, MD, [facility] 11/23/2014, 10:54 AM

## 2014-11-23 NOTE — Patient Instructions (Signed)
Medication Instructions:   Your physician recommends that you continue on your current medications as directed. Please refer to the Current Medication list given to you today.     Labwork:  ONE YEAR PRIOR TO YOUR ONE YEAR FOLLOW-UP APPOINTMENT WITH DR NELSON--CMET, TSH, CBC W DIFF, AND LIPIDS---PLEASE COME FASTING TO THIS LAB APPOINTMENT     Follow-Up:  Your physician wants you to follow-up in: [place] will receive a reminder letter in the mail two months in advance. If you don't receive a letter, please call our office to schedule the follow-up appointment.  PLEASE HAVE YOUR LABS DONE PRIOR TO THIS OFFICE VISIT

## 2014-11-23 NOTE — Telephone Encounter (Signed)
plz phone in. 

## 2014-11-24 NOTE — Telephone Encounter (Signed)
Rx called in as directed.   

## 2014-12-07 ENCOUNTER — Other Ambulatory Visit: Payer: Self-pay | Admitting: Family Medicine

## 2015-01-11 ENCOUNTER — Other Ambulatory Visit: Payer: Self-pay | Admitting: Family Medicine

## 2015-01-20 DIAGNOSIS — I129 Hypertensive chronic kidney disease with stage 1 through stage 4 chronic kidney disease, or unspecified chronic kidney disease: Secondary | ICD-10-CM | POA: Diagnosis not present

## 2015-01-20 DIAGNOSIS — N183 Chronic kidney disease, stage 3 (moderate): Secondary | ICD-10-CM | POA: Diagnosis not present

## 2015-01-20 DIAGNOSIS — E1129 Type 2 diabetes mellitus with other diabetic kidney complication: Secondary | ICD-10-CM | POA: Diagnosis not present

## 2015-01-20 DIAGNOSIS — R809 Proteinuria, unspecified: Secondary | ICD-10-CM | POA: Diagnosis not present

## 2015-01-28 ENCOUNTER — Other Ambulatory Visit: Payer: Self-pay | Admitting: Cardiology

## 2015-01-28 ENCOUNTER — Other Ambulatory Visit: Payer: Self-pay | Admitting: Family Medicine

## 2015-01-28 DIAGNOSIS — IMO0002 Reserved for concepts with insufficient information to code with codable children: Secondary | ICD-10-CM

## 2015-01-28 DIAGNOSIS — D696 Thrombocytopenia, unspecified: Secondary | ICD-10-CM

## 2015-01-28 DIAGNOSIS — E1165 Type 2 diabetes mellitus with hyperglycemia: Secondary | ICD-10-CM

## 2015-01-28 DIAGNOSIS — E1121 Type 2 diabetes mellitus with diabetic nephropathy: Secondary | ICD-10-CM

## 2015-01-28 DIAGNOSIS — D631 Anemia in chronic kidney disease: Secondary | ICD-10-CM

## 2015-01-28 DIAGNOSIS — E785 Hyperlipidemia, unspecified: Secondary | ICD-10-CM

## 2015-01-28 DIAGNOSIS — N183 Chronic kidney disease, stage 3 unspecified: Secondary | ICD-10-CM

## 2015-01-28 DIAGNOSIS — E039 Hypothyroidism, unspecified: Secondary | ICD-10-CM

## 2015-01-28 DIAGNOSIS — N189 Chronic kidney disease, unspecified: Secondary | ICD-10-CM

## 2015-01-28 DIAGNOSIS — I10 Essential (primary) hypertension: Secondary | ICD-10-CM

## 2015-01-30 ENCOUNTER — Other Ambulatory Visit: Payer: Self-pay | Admitting: Family Medicine

## 2015-01-30 NOTE — Telephone Encounter (Signed)
Plz phone in

## 2015-01-30 NOTE — Telephone Encounter (Signed)
Ok to refill 

## 2015-01-31 ENCOUNTER — Other Ambulatory Visit: Payer: Self-pay | Admitting: Family Medicine

## 2015-01-31 NOTE — Telephone Encounter (Signed)
Rx called in as directed.   

## 2015-02-01 ENCOUNTER — Other Ambulatory Visit (INDEPENDENT_AMBULATORY_CARE_PROVIDER_SITE_OTHER): Payer: Medicare Other

## 2015-02-01 DIAGNOSIS — N183 Chronic kidney disease, stage 3 unspecified: Secondary | ICD-10-CM

## 2015-02-01 DIAGNOSIS — D631 Anemia in chronic kidney disease: Secondary | ICD-10-CM

## 2015-02-01 DIAGNOSIS — N189 Chronic kidney disease, unspecified: Secondary | ICD-10-CM | POA: Diagnosis not present

## 2015-02-01 DIAGNOSIS — E039 Hypothyroidism, unspecified: Secondary | ICD-10-CM | POA: Diagnosis not present

## 2015-02-01 DIAGNOSIS — E1165 Type 2 diabetes mellitus with hyperglycemia: Secondary | ICD-10-CM

## 2015-02-01 DIAGNOSIS — E1121 Type 2 diabetes mellitus with diabetic nephropathy: Secondary | ICD-10-CM | POA: Diagnosis not present

## 2015-02-01 DIAGNOSIS — E785 Hyperlipidemia, unspecified: Secondary | ICD-10-CM | POA: Diagnosis not present

## 2015-02-01 DIAGNOSIS — I10 Essential (primary) hypertension: Secondary | ICD-10-CM

## 2015-02-01 DIAGNOSIS — D696 Thrombocytopenia, unspecified: Secondary | ICD-10-CM

## 2015-02-01 DIAGNOSIS — IMO0002 Reserved for concepts with insufficient information to code with codable children: Secondary | ICD-10-CM

## 2015-02-01 LAB — LIPID PANEL
CHOL/HDL RATIO: 3
Cholesterol: 136 mg/dL (ref 0–200)
HDL: 49.4 mg/dL (ref >39.00)
LDL CALC: 73 mg/dL (ref 0–99)
NonHDL: 86.71
TRIGLYCERIDES: 70 mg/dL (ref 0.0–149.0)
VLDL: 14 mg/dL (ref 0.0–40.0)

## 2015-02-01 LAB — CBC WITH DIFFERENTIAL/PLATELET
BASOS ABS: 0 10*3/uL (ref 0.0–0.1)
Basophils Relative: 0.3 % (ref 0.0–3.0)
EOS ABS: 0.1 10*3/uL (ref 0.0–0.7)
Eosinophils Relative: 1.2 % (ref 0.0–5.0)
HCT: 35.8 % — ABNORMAL LOW (ref 39.0–52.0)
Hemoglobin: 11.6 g/dL — ABNORMAL LOW (ref 13.0–17.0)
LYMPHS ABS: 4.7 10*3/uL — AB (ref 0.7–4.0)
Lymphocytes Relative: 60.4 % — ABNORMAL HIGH (ref 12.0–46.0)
MCHC: 32.5 g/dL (ref 30.0–36.0)
MCV: 95.1 fl (ref 78.0–100.0)
MONOS PCT: 3.9 % (ref 3.0–12.0)
Monocytes Absolute: 0.3 10*3/uL (ref 0.1–1.0)
NEUTROS ABS: 2.7 10*3/uL (ref 1.4–7.7)
NEUTROS PCT: 34.2 % — AB (ref 43.0–77.0)
PLATELETS: 134 10*3/uL — AB (ref 150.0–400.0)
RBC: 3.77 Mil/uL — ABNORMAL LOW (ref 4.22–5.81)
RDW: 13.9 % (ref 11.5–15.5)
WBC: 7.8 10*3/uL (ref 4.0–10.5)

## 2015-02-01 LAB — RENAL FUNCTION PANEL
Albumin: 4.1 g/dL (ref 3.5–5.2)
BUN: 34 mg/dL — ABNORMAL HIGH (ref 6–23)
CHLORIDE: 108 meq/L (ref 96–112)
CO2: 29 meq/L (ref 19–32)
Calcium: 9.8 mg/dL (ref 8.4–10.5)
Creatinine, Ser: 1.48 mg/dL (ref 0.40–1.50)
GFR: 49.29 mL/min — AB (ref >60.00)
Glucose, Bld: 65 mg/dL — ABNORMAL LOW (ref 70–99)
PHOSPHORUS: 3.3 mg/dL (ref 2.3–4.6)
Potassium: 4.5 mEq/L (ref 3.5–5.1)
Sodium: 145 mEq/L (ref 135–145)

## 2015-02-01 LAB — MICROALBUMIN / CREATININE URINE RATIO
CREATININE, U: 53.2 mg/dL
Microalb Creat Ratio: 4.3 mg/g (ref 0.0–30.0)
Microalb, Ur: 2.3 mg/dL — ABNORMAL HIGH (ref 0.0–1.9)

## 2015-02-01 LAB — HEMOGLOBIN A1C: Hgb A1c MFr Bld: 8.2 % — ABNORMAL HIGH (ref 4.6–6.5)

## 2015-02-01 LAB — TSH: TSH: 1.65 u[IU]/mL (ref 0.35–4.50)

## 2015-02-08 ENCOUNTER — Encounter: Payer: Self-pay | Admitting: Family Medicine

## 2015-02-08 ENCOUNTER — Ambulatory Visit (INDEPENDENT_AMBULATORY_CARE_PROVIDER_SITE_OTHER): Payer: Medicare Other | Admitting: Family Medicine

## 2015-02-08 VITALS — BP 130/60 | HR 64 | Temp 97.4°F | Ht 71.5 in | Wt 168.2 lb

## 2015-02-08 DIAGNOSIS — N183 Chronic kidney disease, stage 3 unspecified: Secondary | ICD-10-CM

## 2015-02-08 DIAGNOSIS — N189 Chronic kidney disease, unspecified: Secondary | ICD-10-CM

## 2015-02-08 DIAGNOSIS — I2510 Atherosclerotic heart disease of native coronary artery without angina pectoris: Secondary | ICD-10-CM | POA: Diagnosis not present

## 2015-02-08 DIAGNOSIS — E1165 Type 2 diabetes mellitus with hyperglycemia: Secondary | ICD-10-CM

## 2015-02-08 DIAGNOSIS — Z23 Encounter for immunization: Secondary | ICD-10-CM | POA: Diagnosis not present

## 2015-02-08 DIAGNOSIS — Z7189 Other specified counseling: Secondary | ICD-10-CM

## 2015-02-08 DIAGNOSIS — IMO0002 Reserved for concepts with insufficient information to code with codable children: Secondary | ICD-10-CM

## 2015-02-08 DIAGNOSIS — D696 Thrombocytopenia, unspecified: Secondary | ICD-10-CM

## 2015-02-08 DIAGNOSIS — E039 Hypothyroidism, unspecified: Secondary | ICD-10-CM

## 2015-02-08 DIAGNOSIS — I10 Essential (primary) hypertension: Secondary | ICD-10-CM

## 2015-02-08 DIAGNOSIS — D631 Anemia in chronic kidney disease: Secondary | ICD-10-CM

## 2015-02-08 DIAGNOSIS — E1121 Type 2 diabetes mellitus with diabetic nephropathy: Secondary | ICD-10-CM

## 2015-02-08 DIAGNOSIS — E785 Hyperlipidemia, unspecified: Secondary | ICD-10-CM

## 2015-02-08 DIAGNOSIS — Z0000 Encounter for general adult medical examination without abnormal findings: Secondary | ICD-10-CM | POA: Diagnosis not present

## 2015-02-08 MED ORDER — INSULIN LISPRO 100 UNIT/ML (KWIKPEN): 5.0000 [IU] | PEN_INJECTOR | Freq: Three times a day (TID) | SUBCUTANEOUS | Status: DC

## 2015-02-08 NOTE — Assessment & Plan Note (Signed)
Stable. ?ITP.

## 2015-02-08 NOTE — Assessment & Plan Note (Signed)
Chronic, stable 

## 2015-02-08 NOTE — Assessment & Plan Note (Addendum)
Advanced directives - wants full code. Packet provided today. Unsure HCPOA, but likely 2 sons.

## 2015-02-08 NOTE — Patient Instructions (Addendum)
Set up advanced directives. Packet provided today.  We will send labs to Dr Mercy Moore.  Increase water to keep up with recent diarrhea.  Ok to decrease lasix back to 1/2 tablet as needed for leg swelling.  Good to see you today, call us with questions. Return in 4 months for follow up visit.   Health Maintenance, Male A healthy lifestyle and preventative care can promote health and wellness.  Maintain regular health, dental, and eye exams.  Eat a healthy diet. Foods like vegetables, fruits, whole grains, low-fat dairy products, and lean protein foods contain the nutrients you need and are low in calories. Decrease your intake of foods high in solid fats, added sugars, and salt. Get information about a proper diet from your health care provider, if necessary.  Regular physical exercise is one of the most important things you can do for your health. Most adults should get at least 150 minutes of moderate-intensity exercise (any activity that increases your heart rate and causes you to sweat) each week. In addition, most adults need muscle-strengthening exercises on 2 or more days a week.   Maintain a healthy weight. The body mass index (BMI) is a screening tool to identify possible weight problems. It provides an estimate of body fat based on height and weight. Your health care provider can find your BMI and can help you achieve or maintain a healthy weight. For males 20 years and older:  A BMI below 18.5 is considered underweight.  A BMI of 18.5 to 24.9 is normal.  A BMI of 25 to 29.9 is considered overweight.  A BMI of 30 and above is considered obese.  Maintain normal blood lipids and cholesterol by exercising and minimizing your intake of saturated fat. Eat a balanced diet with plenty of fruits and vegetables. Blood tests for lipids and cholesterol should begin at age 24 and be repeated every 5 years. If your lipid or cholesterol levels are high, you are over age 77, or you are at high  risk for heart disease, you may need your cholesterol levels checked more frequently.Ongoing high lipid and cholesterol levels should be treated with medicines if diet and exercise are not working.  If you smoke, find out from your health care provider how to quit. If you do not use tobacco, do not start.  Lung cancer screening is recommended for adults aged 52-80 years who are at high risk for developing lung cancer because of a history of smoking. A yearly low-dose CT scan of the lungs is recommended for people who have at least a 30-pack-year history of smoking and are current smokers or have quit within the past 15 years. A pack year of smoking is smoking an average of 1 pack of cigarettes a day for 1 year (for example, a 30-pack-year history of smoking could mean smoking 1 pack a day for 30 years or 2 packs a day for 15 years). Yearly screening should continue until the smoker has stopped smoking for at least 15 years. Yearly screening should be stopped for people who develop a health problem that would prevent them from having lung cancer treatment.  If you choose to drink alcohol, do not have more than 2 drinks per day. One drink is considered to be 12 oz (360 mL) of beer, 5 oz (150 mL) of wine, or 1.5 oz (45 mL) of liquor.  Avoid the use of street drugs. Do not share needles with anyone. Ask for help if you need support or instructions  about stopping the use of drugs.  High blood pressure causes heart disease and increases the risk of stroke. High blood pressure is more likely to develop in:  People who have blood pressure in the end of the normal range (100-139/85-89 mm Hg).  People who are overweight or obese.  People who are African American.  If you are 59-65 years of age, have your blood pressure checked every 3-5 years. If you are 87 years of age or older, have your blood pressure checked every year. You should have your blood pressure measured twice--once when you are at a hospital  or clinic, and once when you are not at a hospital or clinic. Record the average of the two measurements. To check your blood pressure when you are not at a hospital or clinic, you can use:  An automated blood pressure machine at a pharmacy.  A home blood pressure monitor.  If you are 34-63 years old, ask your health care provider if you should take aspirin to prevent heart disease.  Diabetes screening involves taking a blood sample to check your fasting blood sugar level. This should be done once every 3 years after age 31 if you are at a normal weight and without risk factors for diabetes. Testing should be considered at a younger age or be carried out more frequently if you are overweight and have at least 1 risk factor for diabetes.  Colorectal cancer can be detected and often prevented. Most routine colorectal cancer screening begins at the age of 53 and continues through age 70. However, your health care provider may recommend screening at an earlier age if you have risk factors for colon cancer. On a yearly basis, your health care provider may provide home test kits to check for hidden blood in the stool. A small camera at the end of a tube may be used to directly examine the colon (sigmoidoscopy or colonoscopy) to detect the earliest forms of colorectal cancer. Talk to your health care provider about this at age 59 when routine screening begins. A direct exam of the colon should be repeated every 5-10 years through age 29, unless early forms of precancerous polyps or small growths are found.  People who are at an increased risk for hepatitis B should be screened for this virus. You are considered at high risk for hepatitis B if:  You were born in a country where hepatitis B occurs often. Talk with your health care provider about which countries are considered high risk.  Your parents were born in a high-risk country and you have not received a shot to protect against hepatitis B (hepatitis B  vaccine).  You have HIV or AIDS.  You use needles to inject street drugs.  You live with, or have sex with, someone who has hepatitis B.  You are a man who has sex with other men (MSM).  You get hemodialysis treatment.  You take certain medicines for conditions like cancer, organ transplantation, and autoimmune conditions.  Hepatitis C blood testing is recommended for all people born from [birth year] through 1965 and any individual with known risk factors for hepatitis C.  Healthy men should no longer receive prostate-specific antigen (PSA) blood tests as part of routine cancer screening. Talk to your health care provider about prostate cancer screening.  Testicular cancer screening is not recommended for adolescents or adult males who have no symptoms. Screening includes self-exam, a health care provider exam, and other screening tests. Consult with your health care provider  about any symptoms you have or any concerns you have about testicular cancer.  Practice safe sex. Use condoms and avoid high-risk sexual practices to reduce the spread of sexually transmitted infections (STIs).  You should be screened for STIs, including gonorrhea and chlamydia if:  You are sexually active and are younger than 24 years.  You are older than 24 years, and your health care provider tells you that you are at risk for this type of infection.  Your sexual activity has changed since you were last screened, and you are at an increased risk for chlamydia or gonorrhea. Ask your health care provider if you are at risk.  If you are at risk of being infected with HIV, it is recommended that you take a prescription medicine daily to prevent HIV infection. This is called pre-exposure prophylaxis (PrEP). You are considered at risk if:  You are a man who has sex with other men (MSM).  You are a heterosexual man who is sexually active with multiple partners.  You take drugs by injection.  You are sexually active  with a partner who has HIV.  Talk with your health care provider about whether you are at high risk of being infected with HIV. If you choose to begin PrEP, you should first be tested for HIV. You should then be tested every 3 months for as long as you are taking PrEP.  Use sunscreen. Apply sunscreen liberally and repeatedly throughout the day. You should seek shade when your shadow is shorter than you. Protect yourself by wearing long sleeves, pants, a wide-brimmed hat, and sunglasses year round whenever you are outdoors.  Tell your health care provider of new moles or changes in moles, especially if there is a change in shape or color. Also, tell your health care provider if a mole is larger than the size of a pencil eraser.  A one-time screening for abdominal aortic aneurysm (AAA) and surgical repair of large AAAs by ultrasound is recommended for men aged 23-75 years who are current or former smokers.  Stay current with your vaccines (immunizations).   This information is not intended to replace advice given to you by your health care provider. Make sure you discuss any questions you have with your health care provider.   Document Released: 08/17/2007 Document Revised: 03/11/2014 Document Reviewed: 07/16/2010 Elsevier Interactive Patient Education [employer].

## 2015-02-08 NOTE — Assessment & Plan Note (Signed)
Chronic, stable. A1c above goal <8%. No changes today. Reviewed med regimen, pt unsure if he takes novolog or humalog. refilled humalog.

## 2015-02-08 NOTE — Assessment & Plan Note (Signed)
Appreciate renal care - will send today's labs attn renal.

## 2015-02-08 NOTE — Progress Notes (Signed)
Pre visit review using our clinic review tool, if applicable. No additional management support is needed unless otherwise documented below in the visit note. 

## 2015-02-08 NOTE — Assessment & Plan Note (Signed)
Chronic, stable. Continue levothyroxine 50mcg daily.  ?

## 2015-02-08 NOTE — Assessment & Plan Note (Signed)

## 2015-02-08 NOTE — Assessment & Plan Note (Signed)
Stable. Followed by cards.  

## 2015-02-08 NOTE — Assessment & Plan Note (Signed)
Chronic, stable. Continue atorvastatin and niacin.

## 2015-02-08 NOTE — Assessment & Plan Note (Signed)
Chronic, stable. Continue current regimen. Discussed ok to return lasix back to 1/2 tab prn leg swelling.

## 2015-02-08 NOTE — Progress Notes (Signed)
BP 130/60 mmHg  Pulse 64  Temp(Src) 97.4 F (36.3 C) (Oral)  Ht 5' 11.5" (1.816 m)  Wt 168 lb 4 oz (76.318 kg)  BMI 23.14 kg/m2   CC: medicare wellness visit  Subjective:    Patient ID: Scott [name], male    DOB: 07/04/1940, 74 y.o.   MRN: [id number]  HPI: MANTEJ TRITZ is a 74 y.o. male presenting on 02/08/2015 for Annual Exam   Upset stomach for last 2 days with diarrhea. Requests labs sent to Dr Mercy Moore.   Brings log of sugars and blood pressures: Sugars fasting 90-150, post prandial 90-250 Blood pressures 110-140/50-70s, HR 50-70s No low sugars. Several high readings.  Started on lasix 40mg  daily by renal - has noted some malaise and hypotension.   Failed hearing screen. Has seen [place] audiologist, but not recently.  Recent eye exam - Sees Dr. Katy Fitch Q6 mo for h/o glaucoma s/p laser surgery. + fall in last year - tripped over junk in the garage, no injury.  Denies depression/sadness. Enjoys hobbies. Walks dog.   Preventative: COLONOSCOPY Date: 06/2014 hyperplastic polyp, rpt 5 yrs Oletta Lamas) Prostate screening - normal DRE and PSA last year - will stop. Flu shot 02/08/2015 Pneumovax 2013. prevnar 07/2013 zostavax 2015 Advanced directives - wants full code. Packet provided today. Unsure HCPOA, but likely 2 sons. Seat belt use discussed Sunscreen use discussed.  Lives with wife, 1 dog Occupation: retired, was route Hotel manager Edu: 11th gr Activity: walks dog 3-4x/day, yardwork, rides bicycle. Diet: drinks diet coke, no vegetables  Relevant past medical, surgical, family and social history reviewed and updated as indicated. Interim medical history since our last visit reviewed. Allergies and medications reviewed and updated. Current Outpatient Prescriptions on File Prior to Visit  Medication Sig  . acetaminophen (TYLENOL) 500 MG tablet Take 1,000 mg by mouth every 6 (six) hours as needed for mild pain.   Marland Kitchen amLODipine (NORVASC) 2.5 MG tablet Take 1 tablet (2.5 mg  total) by mouth daily.  Marland Kitchen aspirin 325 MG tablet Take 325 mg by mouth daily.  Marland Kitchen atorvastatin (LIPITOR) 80 MG tablet TAKE ONE TABLET BY MOUTH ONCE DAILY  . carvedilol (COREG) 6.25 MG tablet TAKE ONE TABLET BY MOUTH TWICE DAILY  . glucose blood (ONE TOUCH ULTRA TEST) test strip Use to check sugar twice daily and as needed Dx: E11.21; E11.65  . Insulin Detemir (LEVEMIR FLEXTOUCH) 100 UNIT/ML Pen Inject 30 Units into the skin daily at 10 pm.  . levothyroxine (SYNTHROID, LEVOTHROID) 50 MCG tablet TAKE ONE TABLET BY MOUTH ONCE DAILY  . lisinopril (PRINIVIL,ZESTRIL) 20 MG tablet Take 1 tablet (20 mg total) by mouth daily.  . niacin (SLO-NIACIN) 500 MG tablet Take 500 mg by mouth at bedtime.  . nitroGLYCERIN (NITROSTAT) 0.4 MG SL tablet Place 0.4 mg under the tongue every 5 (five) minutes as needed for chest pain ((MAX of 3 doses)).  Marland Kitchen zolpidem (AMBIEN) 10 MG tablet TAKE ONE-HALF TABLET BY MOUTH AT BEDTIME   No current facility-administered medications on file prior to visit.    Review of Systems Per HPI unless specifically indicated in ROS section     Objective:    BP 130/60 mmHg  Pulse 64  Temp(Src) 97.4 F (36.3 C) (Oral)  Ht 5' 11.5" (1.816 m)  Wt 168 lb 4 oz (76.318 kg)  BMI 23.14 kg/m2  Wt Readings from Last 3 Encounters:  02/08/15 168 lb 4 oz (76.318 kg)  11/23/14 168 lb 6.4 oz (76.386 kg)  08/08/14 163 lb  4 oz (74.05 kg)    Physical Exam  Constitutional: He is oriented to person, place, and time. He appears well-developed and well-nourished. No distress.  HENT:  Head: Normocephalic and atraumatic.  Right Ear: Hearing, tympanic membrane, external ear and ear canal normal.  Left Ear: Hearing, tympanic membrane, external ear and ear canal normal.  Nose: Nose normal.  Mouth/Throat: Uvula is midline, oropharynx is clear and moist and mucous membranes are normal. No oropharyngeal exudate, posterior oropharyngeal edema or posterior oropharyngeal erythema.  Eyes: Conjunctivae and EOM  are normal. Pupils are equal, round, and reactive to light. No scleral icterus.  Neck: Normal range of motion. Neck supple. Carotid bruit is not present. No thyromegaly present.  Cardiovascular: Normal rate, regular rhythm, normal heart sounds and intact distal pulses.   No murmur heard. Pulses:      Radial pulses are 2+ on the right side, and 2+ on the left side.  Pulmonary/Chest: Effort normal and breath sounds normal. No respiratory distress. He has no wheezes. He has no rales.  Abdominal: Soft. Bowel sounds are normal. He exhibits no distension and no mass. There is no hepatosplenomegaly. There is no tenderness. There is no rebound and no guarding.  Musculoskeletal: Normal range of motion. He exhibits edema (tr).  Lymphadenopathy:    He has no cervical adenopathy.  Neurological: He is alert and oriented to person, place, and time.  CN grossly intact, station and gait intact Recall 2/3, 3/3 with cue Calculation 5/5 serial 3s  Skin: Skin is warm and dry. No rash noted.  Psychiatric: He has a normal mood and affect. His behavior is normal. Judgment and thought content normal.  Nursing note and vitals reviewed.  Results for orders placed or performed in visit on 02/08/15  HM DIABETES EYE EXAM  Result Value Ref Range   HM Diabetic Eye Exam No Retinopathy No Retinopathy      Assessment & Plan:   Problem List Items Addressed This Visit    Uncontrolled type 2 diabetes mellitus with nephropathy ([place])    Chronic, stable. A1c above goal <8%. No changes today. Reviewed med regimen, pt unsure if he takes novolog or humalog. refilled humalog.      Relevant Medications   insulin lispro (HUMALOG) 100 UNIT/ML KiwkPen   Thrombocytopenia (HCC)    Stable. ?ITP.      Medicare annual wellness visit, subsequent    I have personally reviewed the Medicare Annual Wellness questionnaire and have noted 1. The patient's medical and social history 2. Their use of alcohol, tobacco or illicit  drugs 3. Their current medications and supplements 4. The patient's functional ability including ADL's, fall risks, home safety risks and hearing or visual impairment. Cognitive function has been assessed and addressed as indicated.  5. Diet and physical activity 6. Evidence for depression or mood disorders The patients weight, height, BMI have been recorded in the chart. I have made referrals, counseling and provided education to the patient based on review of the above and I have provided the pt with a written personalized care plan for preventive services. Provider list updated.. See scanned questionairre as needed for further documentation. Reviewed preventative protocols and updated unless pt declined.       Hypothyroidism    Chronic, stable. Continue levothyroxine 46mcg daily.      Hypertension    Chronic, stable. Continue current regimen. Discussed ok to return lasix back to 1/2 tab prn leg swelling.      Relevant Medications   furosemide (LASIX) 40  MG tablet   HLD (hyperlipidemia)    Chronic, stable. Continue atorvastatin and niacin.       Relevant Medications   furosemide (LASIX) 40 MG tablet   CKD (chronic kidney disease) stage 3, GFR 30-59 ml/min    Appreciate renal care - will send today's labs attn renal.       CAD (coronary artery disease)    Stable. Followed by cards.      Relevant Medications   furosemide (LASIX) 40 MG tablet   Anemia in CKD (chronic kidney disease)    Chronic, stable.       Advanced care planning/counseling discussion    Advanced directives - wants full code. Packet provided today. Unsure HCPOA, but likely 2 sons.       Other Visit Diagnoses    Need for influenza vaccination    -  Primary    Relevant Orders    Flu Vaccine QUAD 36+ mos PF IM (Fluarix & Fluzone Quad PF) (Completed)        Follow up plan: Return in about 4 months (around 06/09/2015), or as needed, for follow up visit.

## 2015-02-22 ENCOUNTER — Other Ambulatory Visit: Payer: Self-pay | Admitting: Family Medicine

## 2015-02-23 ENCOUNTER — Other Ambulatory Visit: Payer: Self-pay | Admitting: *Deleted

## 2015-02-23 MED ORDER — NITROGLYCERIN 0.4 MG SL SUBL: 0.4000 mg | SUBLINGUAL_TABLET | SUBLINGUAL | Status: DC | PRN

## 2015-03-05 HISTORY — PX: CATARACT EXTRACTION, BILATERAL: SHX1313

## 2015-03-17 ENCOUNTER — Telehealth: Payer: Self-pay

## 2015-03-17 NOTE — Telephone Encounter (Signed)
Pt said with levemir pens he is being charged 2 copays; spoke with Sybil at [facility]; levemir comes 5 pens to a box and at pts present dosage is 50 day supply; cannot open box so pt being charged $90.00 for 2 month rx. Pt said he cannot afford that and will cut his dosage back. I advised would send note to Dr Darnell Level but pt said I could if I wanted to but he was going to cut his dosage. Note sent to Dr Darnell Level.

## 2015-03-20 MED ORDER — INSULIN DETEMIR 100 UNIT/ML FLEXPEN: 30.0000 [IU] | PEN_INJECTOR | Freq: Every day | SUBCUTANEOUS | Status: DC

## 2015-03-20 NOTE — Telephone Encounter (Signed)
Don't recommend cutting dosage. Would offer syringes instead of pens but then patient would have to draw out 30 units daily.  Would also suggest he price out options at different pharmacy - there may be a pharmacy that is able to open box to give him exact amt needed.   Lab Results  Component Value Date   HGBA1C 8.2* 02/01/2015

## 2015-03-20 NOTE — Telephone Encounter (Signed)
Message left advising patient to call me and let me know what he prefers to do. Advised to not decrease dosing.

## 2015-03-30 ENCOUNTER — Other Ambulatory Visit: Payer: Self-pay | Admitting: Family Medicine

## 2015-03-30 NOTE — Telephone Encounter (Signed)
plz phone in. Also plz touch base with pt about levemir decision

## 2015-03-30 NOTE — Telephone Encounter (Signed)
Rx called in as directed.   

## 2015-04-20 ENCOUNTER — Other Ambulatory Visit: Payer: Self-pay | Admitting: Family Medicine

## 2015-05-03 ENCOUNTER — Telehealth: Payer: Self-pay

## 2015-05-03 NOTE — Telephone Encounter (Signed)
Hansel came by to see if there was any way to change Humalog to Novalog, the Novalog is in network, Please advise him [phone]

## 2015-05-04 MED ORDER — INSULIN ASPART 100 UNIT/ML FLEXPEN: 5.0000 [IU] | PEN_INJECTOR | Freq: Three times a day (TID) | SUBCUTANEOUS | Status: DC

## 2015-05-04 NOTE — Telephone Encounter (Signed)
plz notify novalog sent in to [facility] instead.  How is he doing with levemir?

## 2015-05-04 NOTE — Telephone Encounter (Signed)
Patient notified. He said the levemir seems to be working well. He will call if he has any issues.

## 2015-05-11 DIAGNOSIS — H10413 Chronic giant papillary conjunctivitis, bilateral: Secondary | ICD-10-CM | POA: Diagnosis not present

## 2015-05-11 DIAGNOSIS — H25813 Combined forms of age-related cataract, bilateral: Secondary | ICD-10-CM | POA: Diagnosis not present

## 2015-05-11 DIAGNOSIS — H04123 Dry eye syndrome of bilateral lacrimal glands: Secondary | ICD-10-CM | POA: Diagnosis not present

## 2015-05-11 DIAGNOSIS — E119 Type 2 diabetes mellitus without complications: Secondary | ICD-10-CM | POA: Diagnosis not present

## 2015-05-11 DIAGNOSIS — H401132 Primary open-angle glaucoma, bilateral, moderate stage: Secondary | ICD-10-CM | POA: Diagnosis not present

## 2015-05-11 LAB — HM DIABETES EYE EXAM

## 2015-05-17 ENCOUNTER — Encounter: Payer: Self-pay | Admitting: Family Medicine

## 2015-05-22 DIAGNOSIS — H2511 Age-related nuclear cataract, right eye: Secondary | ICD-10-CM | POA: Diagnosis not present

## 2015-05-22 DIAGNOSIS — H25812 Combined forms of age-related cataract, left eye: Secondary | ICD-10-CM | POA: Diagnosis not present

## 2015-05-22 DIAGNOSIS — H25811 Combined forms of age-related cataract, right eye: Secondary | ICD-10-CM | POA: Diagnosis not present

## 2015-06-06 DIAGNOSIS — H2512 Age-related nuclear cataract, left eye: Secondary | ICD-10-CM | POA: Diagnosis not present

## 2015-06-09 ENCOUNTER — Ambulatory Visit (INDEPENDENT_AMBULATORY_CARE_PROVIDER_SITE_OTHER): Payer: Medicare Other | Admitting: Family Medicine

## 2015-06-09 ENCOUNTER — Encounter: Payer: Self-pay | Admitting: Family Medicine

## 2015-06-09 VITALS — BP 162/78 | HR 60 | Temp 97.3°F | Wt 172.0 lb

## 2015-06-09 DIAGNOSIS — E1142 Type 2 diabetes mellitus with diabetic polyneuropathy: Secondary | ICD-10-CM

## 2015-06-09 DIAGNOSIS — E1121 Type 2 diabetes mellitus with diabetic nephropathy: Secondary | ICD-10-CM

## 2015-06-09 DIAGNOSIS — N183 Chronic kidney disease, stage 3 unspecified: Secondary | ICD-10-CM

## 2015-06-09 DIAGNOSIS — E1165 Type 2 diabetes mellitus with hyperglycemia: Secondary | ICD-10-CM

## 2015-06-09 DIAGNOSIS — IMO0002 Reserved for concepts with insufficient information to code with codable children: Secondary | ICD-10-CM

## 2015-06-09 DIAGNOSIS — I10 Essential (primary) hypertension: Secondary | ICD-10-CM | POA: Diagnosis not present

## 2015-06-09 DIAGNOSIS — E1140 Type 2 diabetes mellitus with diabetic neuropathy, unspecified: Secondary | ICD-10-CM | POA: Insufficient documentation

## 2015-06-09 LAB — COMPREHENSIVE METABOLIC PANEL
ALT: 12 U/L (ref 0–53)
AST: 17 U/L (ref 0–37)
Albumin: 4.2 g/dL (ref 3.5–5.2)
Alkaline Phosphatase: 65 U/L (ref 39–117)
BUN: 24 mg/dL — AB (ref 6–23)
CALCIUM: 9.9 mg/dL (ref 8.4–10.5)
CHLORIDE: 105 meq/L (ref 96–112)
CO2: 30 meq/L (ref 19–32)
CREATININE: 1.31 mg/dL (ref 0.40–1.50)
GFR: 56.69 mL/min — ABNORMAL LOW (ref >60.00)
Glucose, Bld: 172 mg/dL — ABNORMAL HIGH (ref 70–99)
POTASSIUM: 4.2 meq/L (ref 3.5–5.1)
SODIUM: 143 meq/L (ref 135–145)
Total Bilirubin: 0.8 mg/dL (ref 0.2–1.2)
Total Protein: 6.7 g/dL (ref 6.0–8.3)

## 2015-06-09 LAB — HEMOGLOBIN A1C: Hgb A1c MFr Bld: 8.7 % — ABNORMAL HIGH (ref 4.6–6.5)

## 2015-06-09 NOTE — Progress Notes (Signed)
Pre visit review using our clinic review tool, if applicable. No additional management support is needed unless otherwise documented below in the visit note. 

## 2015-06-09 NOTE — Assessment & Plan Note (Signed)
Discussed possible trial of gabapentin. Pt will let me know if desires to try

## 2015-06-09 NOTE — Assessment & Plan Note (Signed)
Appreciate renal care of patient. 

## 2015-06-09 NOTE — Patient Instructions (Addendum)
You are doing well today. Labs today. Blood pressure too high - start taking lasix water pill 1/2 tablet daily for next week then try as needed again as long as blood pressures stay down. Goal blood pressure ideally <135/85.  Return as needed or in 4 months for follow up visit. Let me know if you'd like to try medicine for stinging of feet (likely from diabetes nerve damage)

## 2015-06-09 NOTE — Progress Notes (Signed)
BP 162/80 mmHg  Pulse 60  Temp(Src) 97.3 F (36.3 C)  Wt 172 lb (78.019 kg)   CC: 4 mo f/u visit DM  Subjective:    Patient ID: Scott [name], male    DOB: [birth year], 75 y.o.   MRN: [id number]  HPI: Scott [name] is a 75 y.o. male presenting on 06/09/2015 for Follow-up   DM - regularly does check sugars and brings log - see below. Compliant with antihyperglycemic regimen which includes: levemir 30u nightly and novolog 5u TID AC. Rare low sugars. + foot paresthesias. Last diabetic eye exam 05/11/2015.  Pneumovax: 2013.  Prevnar: 2015. Lab Results  Component Value Date   HGBA1C 8.2* 02/01/2015   Diabetic Foot Exam - Simple   Simple Foot Form  Diabetic Foot exam was performed with the following findings:  Yes 06/09/2015  9:39 AM  Visual Inspection  See comments:  Yes  Sensation Testing  Intact to touch and monofilament testing bilaterally:  Yes  Pulse Check  Posterior Tibialis and Dorsalis pulse intact bilaterally:  Yes  Comments  Onychomycosis Dry soles throughout      HTN - Compliant with current antihypertensive regimen of amlodipine 2.5mg  daily, carvedilol 6.25mg  bid, lisinopril 20mg  daily.  Does check blood pressures at home: and brings log - see below. No low blood pressure readings or symptoms of dizziness/syncope.  Denies HA, vision changes, CP/tightness, SOB, leg swelling. Stressed with wife's care.   Had one episode of prolonged nose bleed 2 weeks ago.  Cataract surgery a few weeks ago. Continues walking regularly.  Brings log of sugars and blood pressure. bp 120-160/60-70, HR 50-60s Sugars fasting 90-140, PM 180-200. One low sugar 62 this morning, on rpt 15 min later 73 after OJ and peanut butter.   Relevant past medical, surgical, family and social history reviewed and updated as indicated. Interim medical history since our last visit reviewed. Allergies and medications reviewed and updated. Current Outpatient Prescriptions on File Prior to Visit  Medication  Sig  . acetaminophen (TYLENOL) 500 MG tablet Take 1,000 mg by mouth every 6 (six) hours as needed for mild pain.   Marland Kitchen amLODipine (NORVASC) 2.5 MG tablet TAKE ONE TABLET BY MOUTH ONCE DAILY  . aspirin 325 MG tablet Take 325 mg by mouth daily.  Marland Kitchen atorvastatin (LIPITOR) 80 MG tablet TAKE ONE TABLET BY MOUTH ONCE DAILY  . carvedilol (COREG) 6.25 MG tablet TAKE ONE TABLET BY MOUTH TWICE DAILY  . furosemide (LASIX) 40 MG tablet Take 0.5 tablets (20 mg total) by mouth daily as needed for fluid.  Marland Kitchen glucose blood (ONE TOUCH ULTRA TEST) test strip Use to check sugar twice daily and as needed Dx: E11.21; E11.65  . insulin aspart (NOVOLOG FLEXPEN) 100 UNIT/ML FlexPen Inject 5 Units into the skin 3 (three) times daily with meals.  . Insulin Detemir (LEVEMIR FLEXTOUCH) 100 UNIT/ML Pen Inject 30 Units into the skin daily at 10 pm.  . levothyroxine (SYNTHROID, LEVOTHROID) 50 MCG tablet TAKE ONE TABLET BY MOUTH ONCE DAILY  . lisinopril (PRINIVIL,ZESTRIL) 20 MG tablet Take 1 tablet (20 mg total) by mouth daily.  . niacin (SLO-NIACIN) 500 MG tablet Take 1,000 mg by mouth at bedtime.   . nitroGLYCERIN (NITROSTAT) 0.4 MG SL tablet Place 1 tablet (0.4 mg total) under the tongue every 5 (five) minutes as needed for chest pain ((MAX of 3 doses)).  Marland Kitchen zolpidem (AMBIEN) 10 MG tablet TAKE ONE-HALF TABLET BY MOUTH AT BEDTIME   No current facility-administered medications on file prior  to visit.    Review of Systems Per HPI unless specifically indicated in ROS section     Objective:    BP 162/80 mmHg  Pulse 60  Temp(Src) 97.3 F (36.3 C)  Wt 172 lb (78.019 kg)  Wt Readings from Last 3 Encounters:  06/09/15 172 lb (78.019 kg)  02/08/15 168 lb 4 oz (76.318 kg)  11/23/14 168 lb 6.4 oz (76.386 kg)    Physical Exam  Constitutional: He appears well-developed and well-nourished. No distress.  HENT:  Head: Normocephalic and atraumatic.  Right Ear: External ear normal.  Left Ear: External ear normal.  Nose: Nose  normal.  Mouth/Throat: Oropharynx is clear and moist. No oropharyngeal exudate.  Eyes: Conjunctivae and EOM are normal. Pupils are equal, round, and reactive to light. No scleral icterus.  Neck: Normal range of motion. Neck supple.  Cardiovascular: Normal rate, regular rhythm, normal heart sounds and intact distal pulses.   No murmur heard. Pulmonary/Chest: Effort normal and breath sounds normal. No respiratory distress. He has no wheezes. He has no rales.  Musculoskeletal: He exhibits no edema.  Chronic R hand deformity from saw accident at work remotely See HPI for foot exam if done  Lymphadenopathy:    He has no cervical adenopathy.  Skin: Skin is warm and dry. No rash noted.  Psychiatric: He has a normal mood and affect.  Nursing note and vitals reviewed.  Results for orders placed or performed in visit on 05/17/15  HM DIABETES EYE EXAM  Result Value Ref Range   HM Diabetic Eye Exam No Retinopathy No Retinopathy   Lab Results  Component Value Date   WBC 7.8 02/01/2015   HGB 11.6* 02/01/2015   HCT 35.8* 02/01/2015   MCV 95.1 02/01/2015   PLT 134.0* 02/01/2015      Assessment & Plan:   Problem List Items Addressed This Visit    Hypertension    bp remains mildly elevated - advised start taking 1/2 lasix daily for 1 wk then trial PRN as long as bp better controlled. Pt agrees.       CKD (chronic kidney disease) stage 3, GFR 30-59 ml/min    Appreciate renal care of patient.      Relevant Orders   Comprehensive metabolic panel   Uncontrolled type 2 diabetes mellitus with nephropathy ([place]) - Primary    Chronic, overall well controlled based on CBG readings. Check A1c. Anticipate will not need meds titrated at this time. Discussed relation of stress to elevated CBGs.       Relevant Orders   Hemoglobin A1c   Diabetic neuropathy ([place])    Discussed possible trial of gabapentin. Pt will let me know if desires to try          Follow up plan: Return in about 4 months  (around 10/09/2015), or as needed, for follow up visit.  [name], MD

## 2015-06-09 NOTE — Assessment & Plan Note (Signed)
bp remains mildly elevated - advised start taking 1/2 lasix daily for 1 wk then trial PRN as long as bp better controlled. Pt agrees.

## 2015-06-09 NOTE — Assessment & Plan Note (Signed)
Chronic, overall well controlled based on CBG readings. Check A1c. Anticipate will not need meds titrated at this time. Discussed relation of stress to elevated CBGs.

## 2015-06-10 ENCOUNTER — Telehealth: Payer: Self-pay | Admitting: Family Medicine

## 2015-06-10 NOTE — Telephone Encounter (Signed)
Accidentally signed lab results - plz notify: A1c worse at 8.7%. Work on regular meal times. No changes to medicines at this time.  Kidneys stable, liver normal.

## 2015-06-12 ENCOUNTER — Other Ambulatory Visit: Payer: Self-pay | Admitting: Family Medicine

## 2015-06-12 DIAGNOSIS — H2512 Age-related nuclear cataract, left eye: Secondary | ICD-10-CM | POA: Diagnosis not present

## 2015-06-12 DIAGNOSIS — H25812 Combined forms of age-related cataract, left eye: Secondary | ICD-10-CM | POA: Diagnosis not present

## 2015-06-12 NOTE — Telephone Encounter (Signed)
Ok to refill 

## 2015-06-12 NOTE — Telephone Encounter (Signed)
Patient notified as instructed by telephone and verbalized understanding. 

## 2015-06-13 ENCOUNTER — Other Ambulatory Visit: Payer: Self-pay | Admitting: Family Medicine

## 2015-06-13 NOTE — Telephone Encounter (Signed)
Medication phoned to pharmacy.  

## 2015-06-13 NOTE — Telephone Encounter (Signed)
plz phone in. 

## 2015-08-22 ENCOUNTER — Other Ambulatory Visit: Payer: Self-pay | Admitting: Family Medicine

## 2015-08-22 NOTE — Telephone Encounter (Signed)
Px written for call in   

## 2015-08-22 NOTE — Telephone Encounter (Signed)
Ok to refill in Dr. Synthia Innocent absence? Last filled 06/13/15 #30 0RF.

## 2015-08-22 NOTE — Telephone Encounter (Signed)
Rx called in as directed.   

## 2015-08-31 DIAGNOSIS — R809 Proteinuria, unspecified: Secondary | ICD-10-CM | POA: Diagnosis not present

## 2015-08-31 DIAGNOSIS — N183 Chronic kidney disease, stage 3 (moderate): Secondary | ICD-10-CM | POA: Diagnosis not present

## 2015-08-31 DIAGNOSIS — I129 Hypertensive chronic kidney disease with stage 1 through stage 4 chronic kidney disease, or unspecified chronic kidney disease: Secondary | ICD-10-CM | POA: Diagnosis not present

## 2015-08-31 DIAGNOSIS — E1129 Type 2 diabetes mellitus with other diabetic kidney complication: Secondary | ICD-10-CM | POA: Diagnosis not present

## 2015-10-03 ENCOUNTER — Other Ambulatory Visit: Payer: Self-pay | Admitting: Family Medicine

## 2015-10-03 NOTE — Telephone Encounter (Signed)
Ok to refill? Last filled 08/22/15 #30 0RF

## 2015-10-03 NOTE — Telephone Encounter (Signed)
plz phone in. 

## 2015-10-04 NOTE — Telephone Encounter (Signed)
Rx called in as directed.   

## 2015-10-10 ENCOUNTER — Ambulatory Visit (INDEPENDENT_AMBULATORY_CARE_PROVIDER_SITE_OTHER): Payer: Medicare Other | Admitting: Family Medicine

## 2015-10-10 ENCOUNTER — Encounter: Payer: Self-pay | Admitting: Family Medicine

## 2015-10-10 VITALS — BP 128/70 | HR 56 | Temp 97.5°F | Ht 71.5 in | Wt 167.5 lb

## 2015-10-10 DIAGNOSIS — IMO0002 Reserved for concepts with insufficient information to code with codable children: Secondary | ICD-10-CM

## 2015-10-10 DIAGNOSIS — E1165 Type 2 diabetes mellitus with hyperglycemia: Secondary | ICD-10-CM

## 2015-10-10 DIAGNOSIS — I10 Essential (primary) hypertension: Secondary | ICD-10-CM | POA: Diagnosis not present

## 2015-10-10 DIAGNOSIS — E785 Hyperlipidemia, unspecified: Secondary | ICD-10-CM

## 2015-10-10 DIAGNOSIS — E1121 Type 2 diabetes mellitus with diabetic nephropathy: Secondary | ICD-10-CM | POA: Diagnosis not present

## 2015-10-10 DIAGNOSIS — N183 Chronic kidney disease, stage 3 unspecified: Secondary | ICD-10-CM

## 2015-10-10 LAB — RENAL FUNCTION PANEL
Albumin: 4.2 g/dL (ref 3.5–5.2)
BUN: 34 mg/dL — ABNORMAL HIGH (ref 6–23)
CO2: 27 mEq/L (ref 19–32)
Calcium: 10 mg/dL (ref 8.4–10.5)
Chloride: 109 mEq/L (ref 96–112)
Creatinine, Ser: 1.36 mg/dL (ref 0.40–1.50)
GFR: 54.24 mL/min — AB (ref >60.00)
GLUCOSE: 127 mg/dL — AB (ref 70–99)
POTASSIUM: 5.6 meq/L — AB (ref 3.5–5.1)
Phosphorus: 3.3 mg/dL (ref 2.3–4.6)
SODIUM: 142 meq/L (ref 135–145)

## 2015-10-10 LAB — HEMOGLOBIN A1C: Hgb A1c MFr Bld: 8 % — ABNORMAL HIGH (ref 4.6–6.5)

## 2015-10-10 NOTE — Progress Notes (Signed)
Pre visit review using our clinic review tool, if applicable. No additional management support is needed unless otherwise documented below in the visit note. 

## 2015-10-10 NOTE — Patient Instructions (Addendum)
You are doing well today I'm sorry to hear about your wife. Return in 4 months for medicare wellness visit Labs today

## 2015-10-10 NOTE — Assessment & Plan Note (Signed)
Chronic, stable. Check A1c today.  No changes to medications today.

## 2015-10-10 NOTE — Assessment & Plan Note (Addendum)
Recheck renal panel. Latest Cr 1.3 - improvement. Followed by renal Dr Mercy Moore

## 2015-10-10 NOTE — Assessment & Plan Note (Signed)
Chronic, stable. Continue lipitor and niacin.

## 2015-10-10 NOTE — Assessment & Plan Note (Signed)
Chronic, stable today. Elevated at home - will monitor and let me know if persistently elevated.

## 2015-10-10 NOTE — Progress Notes (Signed)
BP 128/70   Pulse (!) 56   Temp 97.5 F (36.4 C) (Oral)   Ht 5' 11.5" (1.816 m)   Wt 167 lb 8 oz (76 kg)   BMI 23.04 kg/m    CC: 4 mo f/u visit Subjective:    Patient ID: Scott [name], male    DOB: 1940/03/10, 75 y.o.   MRN: [id number]  HPI: Scott [name] is a 75 y.o. male presenting on 10/10/2015 for Follow-up (4 montnhs on diabetes)   Wife passed away 27mo ago - of cancer? Family supportive - daughter comes out to house daily, son also involved.   DM - regularly does check sugars and brings log - see below. Compliant with antihyperglycemic regimen which includes: levemir 30u nightly and novolog 5u TID AC. Rare low sugars. + foot paresthesias. Last diabetic eye exam 05/11/2015.  Pneumovax: 2013.  Prevnar: 2015. Lab Results  Component Value Date   HGBA1C 8.7 (H) 06/09/2015     HTN - Compliant with current antihypertensive regimen of amlodipine 2.5mg  daily, carvedilol 6.25mg  bid, lisinopril 20mg  daily. Has lasix 10mg . Does check blood pressures at home: and brings log - see below. No low blood pressure readings or symptoms of dizziness/syncope.  Denies HA, vision changes, CP/tightness, SOB, leg swelling.   Brings log of sugars and blood pressure. BP - 110-150s/50-60s, HR 50-60s. This week a few lows 80s/50s, HR 90-110. Felt lousy with hypotensive readings but denies extra BP med.  cbg - fasting 100-130s, PM 200s. Few lows to 68.   HLD - compliant with atorvastatin 80mg  daily and niacin daily.   Relevant past medical, surgical, family and social history reviewed and updated as indicated. Interim medical history since our last visit reviewed. Allergies and medications reviewed and updated. Current Outpatient Prescriptions on File Prior to Visit  Medication Sig  . acetaminophen (TYLENOL) 500 MG tablet Take 1,000 mg by mouth every 6 (six) hours as needed for mild pain.   Marland Kitchen amLODipine (NORVASC) 2.5 MG tablet TAKE ONE TABLET BY MOUTH ONCE DAILY  . aspirin 325 MG tablet Take 325 mg  by mouth daily.  Marland Kitchen atorvastatin (LIPITOR) 80 MG tablet TAKE ONE TABLET BY MOUTH ONCE DAILY  . carvedilol (COREG) 6.25 MG tablet TAKE ONE TABLET BY MOUTH TWICE DAILY  . furosemide (LASIX) 40 MG tablet Take 0.5 tablets (20 mg total) by mouth daily as needed for fluid.  Marland Kitchen glucose blood (ONE TOUCH ULTRA TEST) test strip Use to check sugar twice daily and as needed Dx: E11.21; E11.65  . insulin aspart (NOVOLOG FLEXPEN) 100 UNIT/ML FlexPen Inject 5 Units into the skin 3 (three) times daily with meals.  . Insulin Detemir (LEVEMIR FLEXTOUCH) 100 UNIT/ML Pen Inject 30 Units into the skin daily at 10 pm.  . levothyroxine (SYNTHROID, LEVOTHROID) 50 MCG tablet TAKE ONE TABLET BY MOUTH ONCE DAILY  . lisinopril (PRINIVIL,ZESTRIL) 20 MG tablet Take 1 tablet (20 mg total) by mouth daily.  Marland Kitchen lisinopril (PRINIVIL,ZESTRIL) 20 MG tablet TAKE ONE TABLET BY MOUTH TWICE DAILY  . niacin (SLO-NIACIN) 500 MG tablet Take 1,000 mg by mouth at bedtime.   . nitroGLYCERIN (NITROSTAT) 0.4 MG SL tablet Place 1 tablet (0.4 mg total) under the tongue every 5 (five) minutes as needed for chest pain ((MAX of 3 doses)).  Marland Kitchen zolpidem (AMBIEN) 10 MG tablet TAKE ONE-HALF TABLET BY MOUTH AT BEDTIME   No current facility-administered medications on file prior to visit.     Review of Systems Per HPI unless specifically indicated in  ROS section     Objective:    BP 128/70   Pulse (!) 56   Temp 97.5 F (36.4 C) (Oral)   Ht 5' 11.5" (1.816 m)   Wt 167 lb 8 oz (76 kg)   BMI 23.04 kg/m   Wt Readings from Last 3 Encounters:  10/10/15 167 lb 8 oz (76 kg)  06/09/15 172 lb (78 kg)  02/08/15 168 lb 4 oz (76.3 kg)    Physical Exam  Constitutional: He appears well-developed and well-nourished. No distress.  HENT:  Head: Normocephalic and atraumatic.  Right Ear: External ear normal.  Left Ear: External ear normal.  Nose: Nose normal.  Mouth/Throat: Oropharynx is clear and moist. No oropharyngeal exudate.  Eyes: Conjunctivae and  EOM are normal. Pupils are equal, round, and reactive to light. No scleral icterus.  Neck: Normal range of motion. Neck supple.  Cardiovascular: Normal rate, regular rhythm, normal heart sounds and intact distal pulses.   No murmur heard. Pulmonary/Chest: Effort normal and breath sounds normal. No respiratory distress. He has no wheezes. He has no rales.  Musculoskeletal: He exhibits no edema.  See HPI for foot exam if done  Lymphadenopathy:    He has no cervical adenopathy.  Skin: Skin is warm and dry. No rash noted.  Psychiatric: He has a normal mood and affect.  Nursing note and vitals reviewed.  Results for orders placed or performed in visit on 06/09/15  Comprehensive metabolic panel  Result Value Ref Range   Sodium 143 135 - 145 mEq/L   Potassium 4.2 3.5 - 5.1 mEq/L   Chloride 105 96 - 112 mEq/L   CO2 30 19 - 32 mEq/L   Glucose, Bld 172 (H) 70 - 99 mg/dL   BUN 24 (H) 6 - 23 mg/dL   Creatinine, Ser 1.31 0.40 - 1.50 mg/dL   Total Bilirubin 0.8 0.2 - 1.2 mg/dL   Alkaline Phosphatase 65 39 - 117 U/L   AST 17 0 - 37 U/L   ALT 12 0 - 53 U/L   Total Protein 6.7 6.0 - 8.3 g/dL   Albumin 4.2 3.5 - 5.2 g/dL   Calcium 9.9 8.4 - 10.5 mg/dL   GFR 56.69 (L) >60.00 mL/min  Hemoglobin A1c  Result Value Ref Range   Hgb A1c MFr Bld 8.7 (H) 4.6 - 6.5 %      Assessment & Plan:   Problem List Items Addressed This Visit    CKD (chronic kidney disease) stage 3, GFR 30-59 ml/min    Recheck renal panel. Latest Cr 1.3 - improvement. Followed by renal Dr Mercy Moore      HLD (hyperlipidemia)    Chronic, stable. Continue lipitor and niacin.      Hypertension    Chronic, stable today. Elevated at home - will monitor and let me know if persistently elevated.       Uncontrolled type 2 diabetes mellitus with nephropathy ([facility]CC) - Primary    Chronic, stable. Check A1c today.  No changes to medications today.       Relevant Orders   Hemoglobin A1c   Renal function panel    Other Visit  Diagnoses   None.      Follow up plan: Return in about 4 months (around 02/09/2016), or as needed, for medicare wellness visit.  [name], MD

## 2015-10-11 ENCOUNTER — Telehealth: Payer: Self-pay

## 2015-10-11 ENCOUNTER — Other Ambulatory Visit: Payer: Self-pay | Admitting: Family Medicine

## 2015-10-11 DIAGNOSIS — E875 Hyperkalemia: Secondary | ICD-10-CM

## 2015-10-11 NOTE — Telephone Encounter (Signed)
Dr. Darnell Level asked me to call patient to advise him on his lab results. Patient is coming in to have his K rechecked on Friday.

## 2015-10-13 ENCOUNTER — Other Ambulatory Visit (INDEPENDENT_AMBULATORY_CARE_PROVIDER_SITE_OTHER): Payer: Medicare Other

## 2015-10-13 ENCOUNTER — Other Ambulatory Visit: Payer: Medicare Other

## 2015-10-13 DIAGNOSIS — E875 Hyperkalemia: Secondary | ICD-10-CM | POA: Diagnosis not present

## 2015-10-13 LAB — POTASSIUM: Potassium: 4.1 mEq/L (ref 3.5–5.1)

## 2015-10-16 ENCOUNTER — Telehealth: Payer: Self-pay | Admitting: Family Medicine

## 2015-10-16 NOTE — Telephone Encounter (Signed)
Pt called to get his lab results from Friday Pt stated he doesn't know how to get into my chart

## 2015-10-16 NOTE — Telephone Encounter (Signed)
LMTRC

## 2015-10-18 NOTE — Telephone Encounter (Signed)
Message left advising patient per DPR.

## 2015-10-21 ENCOUNTER — Emergency Department (HOSPITAL_COMMUNITY)
Admission: EM | Admit: 2015-10-21 | Discharge: 2015-10-21 | Disposition: A | Discharge status: Home / Self Care | Payer: Medicare Other | Attending: Emergency Medicine | Admitting: Emergency Medicine

## 2015-10-21 ENCOUNTER — Emergency Department (HOSPITAL_COMMUNITY): Payer: Medicare Other

## 2015-10-21 ENCOUNTER — Encounter (HOSPITAL_COMMUNITY): Payer: Self-pay | Admitting: Emergency Medicine

## 2015-10-21 DIAGNOSIS — E785 Hyperlipidemia, unspecified: Secondary | ICD-10-CM | POA: Diagnosis not present

## 2015-10-21 DIAGNOSIS — Z87891 Personal history of nicotine dependence: Secondary | ICD-10-CM | POA: Diagnosis not present

## 2015-10-21 DIAGNOSIS — E039 Hypothyroidism, unspecified: Secondary | ICD-10-CM | POA: Diagnosis not present

## 2015-10-21 DIAGNOSIS — E1122 Type 2 diabetes mellitus with diabetic chronic kidney disease: Secondary | ICD-10-CM | POA: Diagnosis not present

## 2015-10-21 DIAGNOSIS — R079 Chest pain, unspecified: Secondary | ICD-10-CM

## 2015-10-21 DIAGNOSIS — Z79899 Other long term (current) drug therapy: Secondary | ICD-10-CM | POA: Diagnosis not present

## 2015-10-21 DIAGNOSIS — R0789 Other chest pain: Secondary | ICD-10-CM | POA: Diagnosis not present

## 2015-10-21 DIAGNOSIS — Z7982 Long term (current) use of aspirin: Secondary | ICD-10-CM | POA: Insufficient documentation

## 2015-10-21 DIAGNOSIS — Z794 Long term (current) use of insulin: Secondary | ICD-10-CM | POA: Insufficient documentation

## 2015-10-21 DIAGNOSIS — I129 Hypertensive chronic kidney disease with stage 1 through stage 4 chronic kidney disease, or unspecified chronic kidney disease: Secondary | ICD-10-CM | POA: Diagnosis not present

## 2015-10-21 DIAGNOSIS — I10 Essential (primary) hypertension: Secondary | ICD-10-CM | POA: Diagnosis not present

## 2015-10-21 DIAGNOSIS — E1140 Type 2 diabetes mellitus with diabetic neuropathy, unspecified: Secondary | ICD-10-CM | POA: Diagnosis not present

## 2015-10-21 DIAGNOSIS — I25118 Atherosclerotic heart disease of native coronary artery with other forms of angina pectoris: Secondary | ICD-10-CM | POA: Diagnosis not present

## 2015-10-21 DIAGNOSIS — I2510 Atherosclerotic heart disease of native coronary artery without angina pectoris: Secondary | ICD-10-CM | POA: Diagnosis not present

## 2015-10-21 DIAGNOSIS — N183 Chronic kidney disease, stage 3 (moderate): Secondary | ICD-10-CM | POA: Diagnosis not present

## 2015-10-21 LAB — BASIC METABOLIC PANEL
ANION GAP: 10 (ref 5–15)
BUN: 32 mg/dL — ABNORMAL HIGH (ref 6–20)
CALCIUM: 10.6 mg/dL — AB (ref 8.9–10.3)
CO2: 23 mmol/L (ref 22–32)
Chloride: 111 mmol/L (ref 101–111)
Creatinine, Ser: 1.4 mg/dL — ABNORMAL HIGH (ref 0.61–1.24)
GFR calc Af Amer: 55 mL/min — ABNORMAL LOW (ref >60)
GFR calc non Af Amer: 48 mL/min — ABNORMAL LOW (ref >60)
GLUCOSE: 102 mg/dL — AB (ref 65–99)
Potassium: 4.1 mmol/L (ref 3.5–5.1)
Sodium: 144 mmol/L (ref 135–145)

## 2015-10-21 LAB — CBG MONITORING, ED: GLUCOSE-CAPILLARY: 140 mg/dL — AB (ref 65–99)

## 2015-10-21 LAB — I-STAT TROPONIN, ED
Troponin i, poc: 0 ng/mL (ref 0.00–0.08)
Troponin i, poc: 0 ng/mL (ref 0.00–0.08)

## 2015-10-21 LAB — CBC
HEMATOCRIT: 38.7 % — AB (ref 39.0–52.0)
HEMOGLOBIN: 12.2 g/dL — AB (ref 13.0–17.0)
MCH: 30.4 pg (ref 26.0–34.0)
MCHC: 31.5 g/dL (ref 30.0–36.0)
MCV: 96.5 fL (ref 78.0–100.0)
Platelets: 134 10*3/uL — ABNORMAL LOW (ref 150–400)
RBC: 4.01 MIL/uL — ABNORMAL LOW (ref 4.22–5.81)
RDW: 13.8 % (ref 11.5–15.5)
WBC: 14.6 10*3/uL — ABNORMAL HIGH (ref 4.0–10.5)

## 2015-10-21 NOTE — Consult Note (Signed)
CARDIOLOGY CONSULT NOTE     Primary Care Physician: [name], MD Referring Physician:  ED physician  Primary Cardiologist:  Elizbeth Squires Date: 10/21/2015  Reason for consultation: chest pain  Scott [name] is a 75 y.o. male with a h/o prior PTCA, DM, and HTN followed by Dr Meda Coffee who now presents with atypical chest pain.  His wife recently died.  Since that time, he has had occasional pain over the left lateral breast.  This is burning in nature.  Denies exertional symptoms.  He is currently pain free.  He has follow-up in cardiology office later this week.  Today, he denies symptoms of palpitations, shortness of breath, orthopnea, PND, lower extremity edema, dizziness, presyncope, syncope, or neurologic sequela. The patient is tolerating medications without difficulties and is otherwise without complaint today.   Past Medical History:  Diagnosis Date  . Acquired hand deformity 1962   hand saw accident at work  . Anemia 10/11/2011  . Arthritis   . Bradycardia 10/10/2011  . CAD (coronary artery disease) 10/10/2011   MI s/p PTCA (Dx-OM2 proximal concentric stenosis)  . CKD (chronic kidney disease) stage 3, GFR 30-59 ml/min    [name]  . Colon polyps   . Generalized headaches    frequent  . Glaucoma    s/p laser surgery  . HLD (hyperlipidemia)   . Hypertension   . Hypothyroidism 10/10/2011  . Thrombocytopenia ([place]) 10/11/2011  . Type 2 diabetes with nephropathy [facility])    DM refresher course [facility] (04/2013)   Past Surgical History:  Procedure Laterality Date  . BACK SURGERY     cervical neck  . CATARACT EXTRACTION, BILATERAL Bilateral 2017  . COLONOSCOPY  11/2008   1 polyp, diverticulosis, rec rpt 5 yrs (Dr. Oletta Lamas, [name])  . COLONOSCOPY  06/2014   hyperplastic polyp, rpt 5 yrs ([facility])  . EYE SURGERY  2012   laser surgery for glaucoma  . PTCA  1994, 1995  . US ECHOCARDIOGRAPHY  10/2013   inferior wall hypokinesis, mild LVH, EF 50-55%, mild MR and LA dilation    No  current facility-administered medications for this encounter.   Current Outpatient Prescriptions:  .  acetaminophen (TYLENOL) 500 MG tablet, Take 500-1,000 mg by mouth daily. , Disp: , Rfl:  .  amLODipine (NORVASC) 2.5 MG tablet, TAKE ONE TABLET BY MOUTH ONCE DAILY, Disp: 90 tablet, Rfl: 3 .  aspirin 325 MG tablet, Take 325 mg by mouth at bedtime. , Disp: , Rfl:  .  atorvastatin (LIPITOR) 80 MG tablet, TAKE ONE TABLET BY MOUTH ONCE DAILY (Patient taking differently: TAKE ONE TABLET BY MOUTH ONCE DAILY AT BEDTIME), Disp: 90 tablet, Rfl: 3 .  carvedilol (COREG) 6.25 MG tablet, TAKE ONE TABLET BY MOUTH TWICE DAILY, Disp: 60 tablet, Rfl: 10 .  furosemide (LASIX) 40 MG tablet, Take 20 mg by mouth daily as needed for fluid (ankle swelling). , Disp: , Rfl:  .  insulin aspart (NOVOLOG FLEXPEN) 100 UNIT/ML FlexPen, Inject 5 Units into the skin 3 (three) times daily with meals., Disp: 15 mL, Rfl: 3 .  Insulin Detemir (LEVEMIR FLEXTOUCH) 100 UNIT/ML Pen, Inject 30 Units into the skin daily at 10 pm., Disp: 9 mL, Rfl: 11 .  levothyroxine (SYNTHROID, LEVOTHROID) 50 MCG tablet, TAKE ONE TABLET BY MOUTH ONCE DAILY, Disp: 90 tablet, Rfl: 3 .  lisinopril (PRINIVIL,ZESTRIL) 20 MG tablet, Take 1 tablet (20 mg total) by mouth daily., Disp: 90 tablet, Rfl: 2 .  niacin (SLO-NIACIN) 500 MG tablet, Take 1,000  mg by mouth at bedtime. , Disp: , Rfl:  .  nitroGLYCERIN (NITROSTAT) 0.4 MG SL tablet, Place 1 tablet (0.4 mg total) under the tongue every 5 (five) minutes as needed for chest pain ((MAX of 3 doses))., Disp: 25 tablet, Rfl: 0 .  zolpidem (AMBIEN) 10 MG tablet, TAKE ONE-HALF TABLET BY MOUTH AT BEDTIME, Disp: 30 tablet, Rfl: 0 .  glucose blood (ONE TOUCH ULTRA TEST) test strip, Use to check sugar twice daily and as needed Dx: E11.21; E11.65, Disp: 100 each, Rfl: 6    No Known Allergies  Social History   Social History  . Marital status: Married    Spouse name: N/A  . Number of children: N/A  . Years of  education: N/A   Occupational History  . Not on file.   Social History Main Topics  . Smoking status: Former Smoker    Quit date: 03/04/1978  . Smokeless tobacco: Never Used  . Alcohol use No  . Drug use: No  . Sexual activity: Not on file   Other Topics Concern  . Not on file   Social History Narrative   Lives with wife, 1 dog   Occupation: retired, was route Hotel manager   Edu: 11th gr   Activity: walks dog (pomeranian) 3-4x/day, yardwork, rides bicycle.   Diet: drinks diet coke, no vegetables    Family History  Problem Relation Age of Onset  . Diabetes Sister   . Stomach cancer Sister 21  . Pneumonia Father     caused death  . Other Brother     no communication with brother so unsure of any health conditions  . Coronary artery disease Son 16    5v CABG and stents  . Hyperlipidemia Sister   . Stroke Neg Hx     ROS- All systems are reviewed and negative except as per the HPI above  Physical Exam: Telemetry: Vitals:   10/21/15 0259 10/21/15 0846 10/21/15 0900 10/21/15 1000  BP: 163/90 160/69 162/76 160/74  Pulse: 64 (!) 57 78 (!) 56  Resp: 18 12 18    Temp: 97.5 F (36.4 C)     TempSrc: Oral     SpO2: 100% 99% 98% 100%  Weight: 169 lb (76.7 kg)     Height: 5\' 11"  (1.803 m)       GEN- The patient is well appearing, alert and oriented x 3 today.   Head- normocephalic, atraumatic Eyes-  Sclera clear, conjunctiva pink Ears- hearing intact Oropharynx- clear Neck- supple, no JVP Lymph- no cervical lymphadenopathy Lungs- Clear to ausculation bilaterally, normal work of breathing Heart- Regular rate and rhythm, no murmurs, rubs or gallops, PMI not laterally displaced GI- soft, NT, ND, + BS Extremities- no clubbing, cyanosis, or edema MS- no significant deformity or atrophy Skin- no rash or lesion Psych- euthymic mood, full affect Neuro- strength and sensation are intact  EKGs: reviewed and reveal sinus bradycardia, no ischemic changes  Labs:   Lab Results    Component Value Date   WBC 14.6 (H) 10/21/2015   HGB 12.2 (L) 10/21/2015   HCT 38.7 (L) 10/21/2015   MCV 96.5 10/21/2015   PLT 134 (L) 10/21/2015    Recent Labs Lab 10/21/15 0321  NA 144  K 4.1  CL 111  CO2 23  BUN 32*  CREATININE 1.40*  CALCIUM 10.6*  GLUCOSE 102*   Lab Results  Component Value Date   CKTOTAL 107 10/10/2011   CKMB 3.3 10/10/2011   TROPONINI <0.30 10/26/2013  Lab Results  Component Value Date   CHOL 136 02/01/2015   CHOL 154 01/21/2014   CHOL 138 06/30/2012   Lab Results  Component Value Date   HDL 49.40 02/01/2015   HDL 60.60 01/21/2014   HDL 49.50 06/30/2012   Lab Results  Component Value Date   LDLCALC 73 02/01/2015   LDLCALC 81 01/21/2014   LDLCALC 69 06/30/2012   Lab Results  Component Value Date   TRIG 70.0 02/01/2015   TRIG 63.0 01/21/2014   TRIG 99.0 06/30/2012   Lab Results  Component Value Date   CHOLHDL 3 02/01/2015   CHOLHDL 3 01/21/2014   CHOLHDL 3 06/30/2012   Lab Results  Component Value Date   LDLDIRECT 84 09/04/2011    myoview 2015 reviewed Dr Thera Flake notes reviewed Pt discussed with ED PA    ASSESSMENT AND PLAN:   1. Chest pain/ CAD Both typical and mostly atypical features ekg and cardiac markers are benign He is current pain free I have advised that he continue his current medicine regimen Outpatient myoview is ordered He has follow-up in our office later this week  2. HTN Stable No change required today  3. HL On statin  OK to discharge to home with close outpatient cardiology follow-up   Thompson Grayer, MD 10/21/2015  11:48 AM

## 2015-10-21 NOTE — Discharge Instructions (Signed)
Please read and follow all provided instructions.  Your diagnoses today include:  1. Chest pain, unspecified chest pain type    Tests performed today include: An EKG of your heart A chest x-ray Cardiac enzymes - a blood test for heart muscle damage Blood counts and electrolytes Vital signs. See below for your results today.   Medications prescribed:   Take any prescribed medications only as directed.  Follow-up instructions: Please follow-up with your Cardiologist at your scheduled appointment for further evaluation of your symptoms.   Return instructions:  SEEK IMMEDIATE MEDICAL ATTENTION IF: You have severe chest pain, especially if the pain is crushing or pressure-like and spreads to the arms, back, neck, or jaw, or if you have sweating, nausea (feeling sick to your stomach), or shortness of breath. THIS IS AN EMERGENCY. Don't wait to see if the pain will go away. Get medical help at once. Call 911 or 0 (operator). DO NOT drive yourself to the hospital.  Your chest pain gets worse and does not go away with rest.  You have an attack of chest pain lasting longer than usual, despite rest and treatment with the medications your caregiver has prescribed.  You wake from sleep with chest pain or shortness of breath. You feel dizzy or faint. You have chest pain not typical of your usual pain for which you originally saw your caregiver.  You have any other emergent concerns regarding your health.  Additional Information: Chest pain comes from many different causes. Your caregiver has diagnosed you as having chest pain that is not specific for one problem, but does not require admission.  You are at low risk for an acute heart condition or other serious illness.   Your vital signs today were: BP 162/76    Pulse 78    Temp 97.5 F (36.4 C) (Oral)    Resp 18    Ht 5\' 11"  (1.803 m)    Wt 76.7 kg    SpO2 98%    BMI 23.57 kg/m  If your blood pressure (BP) was elevated above 135/85 this visit,  please have this repeated by your doctor within one month. --------------

## 2015-10-21 NOTE — ED Provider Notes (Signed)
[facility] DEPT Provider Note   CSN: [id number] Arrival date & time: 10/21/15  0243  History   Chief Complaint Chief Complaint  Patient presents with  . Chest Pain    HPI Scott [name] is a 75 y.o. male.  HPI  75 y.o. male with a hx of DM, HLD, HTN, CAD, presents to the Emergency Department today complaining of chest pain x 1 week. States pain occurs randomly and usually triggered by emotional stress with wife passing away 6 weeks ago. States pain is a dull pressure that is on left side of chest that lasts less than a few minutes and then subsides. Pt denies pain currently. Pt states last bout of pressure was around 2AM. Notes taking NTG and states that he really didn't feel a difference. No N/V. No diaphoresis during episodes. No SOB/ABD pain. No fevers. No headaches or syncope. No recent surgeries. No recent travel. No other symptoms noted.    Cardiologist- Dr. Meda Coffee  Past Medical History:  Diagnosis Date  . Acquired hand deformity 1962   hand saw accident at work  . Anemia 10/11/2011  . Arthritis   . Bradycardia 10/10/2011  . CAD (coronary artery disease) 10/10/2011   MI s/p PTCA (Dx-OM2 proximal concentric stenosis)  . CKD (chronic kidney disease) stage 3, GFR 30-59 ml/min    Scott [name]  . Colon polyps   . Generalized headaches    frequent  . Glaucoma    s/p laser surgery  . HLD (hyperlipidemia)   . Hypertension   . Hypothyroidism 10/10/2011  . Thrombocytopenia ([place]) 10/11/2011  . Type 2 diabetes with nephropathy [facility])    DM refresher course [facility] (04/2013)    Patient Active Problem List   Diagnosis Date Noted  . Diabetic neuropathy ([name]) 06/09/2015  . BPV (benign positional vertigo) 05/23/2014  . Advanced care planning/counseling discussion 01/25/2014  . Chest pain 10/26/2013  . Medicare annual wellness visit, subsequent 07/06/2012  . HLD (hyperlipidemia)   . CKD (chronic kidney disease) stage 3, GFR 30-59 ml/min   . Uncontrolled type 2 diabetes mellitus with  nephropathy ([place])   . Anemia in CKD (chronic kidney disease) 10/11/2011  . Thrombocytopenia ([place]) 10/11/2011  . CAD (coronary artery disease) 10/10/2011  . Hypertension 10/10/2011  . Hypothyroidism 10/10/2011    Past Surgical History:  Procedure Laterality Date  . BACK SURGERY     cervical neck  . CATARACT EXTRACTION, BILATERAL Bilateral 2017  . COLONOSCOPY  11/2008   1 polyp, diverticulosis, rec rpt 5 yrs (Dr. Oletta Lamas, [name])  . COLONOSCOPY  06/2014   hyperplastic polyp, rpt 5 yrs ([facility])  . EYE SURGERY  2012   laser surgery for glaucoma  . PTCA  1994, 1995  . US ECHOCARDIOGRAPHY  10/2013   inferior wall hypokinesis, mild LVH, EF 50-55%, mild MR and LA dilation       Home Medications    Prior to Admission medications   Medication Sig Start Date End Date Taking? Authorizing Provider  acetaminophen (TYLENOL) 500 MG tablet Take 1,000 mg by mouth every 6 (six) hours as needed for mild pain.     Historical Provider, MD  amLODipine (NORVASC) 2.5 MG tablet TAKE ONE TABLET BY MOUTH ONCE DAILY 04/20/15   [name], MD  aspirin 325 MG tablet Take 325 mg by mouth daily.    Historical Provider, MD  atorvastatin (LIPITOR) 80 MG tablet TAKE ONE TABLET BY MOUTH ONCE DAILY 04/20/15   [name], MD  carvedilol (COREG) 6.25 MG tablet TAKE ONE TABLET  BY MOUTH TWICE DAILY 01/30/15   Dorothy Spark, MD  furosemide (LASIX) 40 MG tablet Take 0.5 tablets (20 mg total) by mouth daily as needed for fluid. 02/08/15   [name], MD  glucose blood (ONE TOUCH ULTRA TEST) test strip Use to check sugar twice daily and as needed Dx: E11.21; E11.65 01/11/15   [name], MD  insulin aspart (NOVOLOG FLEXPEN) 100 UNIT/ML FlexPen Inject 5 Units into the skin 3 (three) times daily with meals. 05/04/15   [name], MD  Insulin Detemir (LEVEMIR FLEXTOUCH) 100 UNIT/ML Pen Inject 30 Units into the skin daily at 10 pm. 03/20/15   [name], MD  levothyroxine (SYNTHROID, LEVOTHROID)  50 MCG tablet TAKE ONE TABLET BY MOUTH ONCE DAILY 02/22/15   [name], MD  lisinopril (PRINIVIL,ZESTRIL) 20 MG tablet Take 1 tablet (20 mg total) by mouth daily. 05/23/14   [name], MD  niacin (SLO-NIACIN) 500 MG tablet Take 1,000 mg by mouth at bedtime.     Historical Provider, MD  nitroGLYCERIN (NITROSTAT) 0.4 MG SL tablet Place 1 tablet (0.4 mg total) under the tongue every 5 (five) minutes as needed for chest pain ((MAX of 3 doses)). 02/23/15   [name], MD  zolpidem (AMBIEN) 10 MG tablet TAKE ONE-HALF TABLET BY MOUTH AT BEDTIME 10/03/15   [name], MD    Family History Family History  Problem Relation Age of Onset  . Diabetes Sister   . Stomach cancer Sister 32  . Pneumonia Father     caused death  . Other Brother     no communication with brother so unsure of any health conditions  . Coronary artery disease Son 94    5v CABG and stents  . Hyperlipidemia Sister   . Stroke Neg Hx     Social History Social History  Substance Use Topics  . Smoking status: Former Smoker    Quit date: 03/04/1978  . Smokeless tobacco: Never Used  . Alcohol use No     Allergies   Review of patient's allergies indicates no known allergies.   Review of Systems Review of Systems ROS reviewed and all are negative for acute change except as noted in the HPI.  Physical Exam Updated Vital Signs BP 163/90 (BP Location: Left Arm)   Pulse 64   Temp 97.5 F (36.4 C) (Oral)   Resp 18   Ht 5\' 11"  (1.803 m)   Wt 76.7 kg   SpO2 100%   BMI 23.57 kg/m   Physical Exam  Constitutional: He is oriented to person, place, and time. Vital signs are normal. He appears well-developed and well-nourished.  HENT:  Head: Normocephalic.  Right Ear: Hearing normal.  Left Ear: Hearing normal.  Eyes: Conjunctivae and EOM are normal. Pupils are equal, round, and reactive to light.  Neck: Normal range of motion. Neck supple.  Cardiovascular: Normal rate, regular rhythm, normal heart  sounds and intact distal pulses.   Pulmonary/Chest: Effort normal and breath sounds normal. No respiratory distress. He has no wheezes. He has no rales. He exhibits no tenderness.  Abdominal: Soft.  Neurological: He is alert and oriented to person, place, and time.  Skin: Skin is warm and dry.  Psychiatric: He has a normal mood and affect. His speech is normal and behavior is normal. Thought content normal.   ED Treatments / Results  Labs (all labs ordered are listed, but only abnormal results are displayed) Labs Reviewed  BASIC METABOLIC PANEL - Abnormal; Notable for the following:  Result Value   Glucose, Bld 102 (*)    BUN 32 (*)    Creatinine, Ser 1.40 (*)    Calcium 10.6 (*)    GFR calc non Af Amer 48 (*)    GFR calc Af Amer 55 (*)    All other components within normal limits  CBC - Abnormal; Notable for the following:    WBC 14.6 (*)    RBC 4.01 (*)    Hemoglobin 12.2 (*)    HCT 38.7 (*)    Platelets 134 (*)    All other components within normal limits  CBG MONITORING, ED - Abnormal; Notable for the following:    Glucose-Capillary 140 (*)    All other components within normal limits  I-STAT TROPOININ, ED  I-STAT TROPOININ, ED    EKG  EKG Interpretation  Date/Time:  Saturday October 21 2015 02:54:25 EDT Ventricular Rate:  68 PR Interval:  152 QRS Duration: 84 QT Interval:  408 QTC Calculation: 433 R Axis:   12 Text Interpretation:  Normal sinus rhythm Normal ECG Confirmed by Lita Mains  MD, DAVID ([id number]) on 10/21/2015 8:32:36 AM       Radiology Dg Chest 2 View  Result Date: 10/21/2015 CLINICAL DATA:  Chest pain for 1 day EXAM: CHEST  2 VIEW COMPARISON:  10/26/2013 FINDINGS: Normal heart size and mediastinal contours. Aortic atherosclerotic calcification. No acute infiltrate or edema. No effusion or pneumothorax. No acute osseous findings. Chronic metallic density over the right upper quadrant. IMPRESSION: 1. No active cardiopulmonary disease. 2.  Aortic  Atherosclerosis (ICD10-170.0) Electronically Signed   By: Monte Fantasia M.D.   On: 10/21/2015 03:39    Procedures Procedures (including critical care time)  Medications Ordered in ED Medications - No data to display   Initial Impression / Assessment and Plan / ED Course  I have reviewed the triage vital signs and the nursing notes.  Pertinent labs & imaging results that were available during my care of the patient were reviewed by me and considered in my medical decision making (see chart for details).  Clinical Course    Final Clinical Impressions(s) / ED Diagnoses  I have reviewed and evaluated the relevant laboratory valuesI have reviewed and evaluated the relevant imaging studies. I have interpreted the relevant EKG. I have reviewed the relevant previous healthcare records.I obtained HPI from historian. Patient discussed with supervising physician  ED Course:  Assessment: Pt is a 75yM presents with CP x 1week. Noted increased emotional stress with wife passing 6 weeks ago. Notes pain with thoughts of her. Duration less than a few minutes. CP non exertional. Risk Factors DM, HTN, HLD, CAD. Patient is to be discharged with recommendation to follow up with Cardiologist in regards to today's hospital visit. Chest pain is not likely of cardiac or pulmonary etiology d/t presentation, perc negative, VSS, no tracheal deviation, no JVD or new murmur, RRR, breath sounds equal bilaterally, EKG without acute abnormalities, negative troponin x2, and negative CXR. Pt has been advised to the ED is CP becomes exertional, associated with diaphoresis or nausea, radiates to left jaw/arm, worsens or becomes concerning in any way. Pt appears reliable for follow up and is agreeable to discharge. Patient is in no acute distress. Vital Signs are stable. Patient is able to ambulate. Patient able to tolerate PO.   Pt has Cardiology follow up on Thursday Consult to Cardiology agrees with DC. Has scheduled  follow up.   Disposition/Plan:  DC Home Additional Verbal discharge instructions given and discussed with  patient.  Pt Instructed to f/u with Cardiology in the next week for evaluation and treatment of symptoms. Return precautions given Pt acknowledges and agrees with plan  Supervising Physician Julianne Rice, MD   Final diagnoses:  Chest pain, unspecified chest pain type    New Prescriptions New Prescriptions   No medications on file     Shary Decamp, PA-C 10/21/15 0935    Julianne Rice, MD 10/21/15 [phone]

## 2015-10-21 NOTE — ED Notes (Signed)
Pt tells me that he lost his wife of 85 years in June and he is very sad about this.  Pt is tearful and describes that this is very difficult for him despite the constant support of his children.

## 2015-10-21 NOTE — ED Triage Notes (Signed)
Pt. reports intermittent left chest pain with exertional dyspnea onset last week , denies emesis or diaphoresis , his cardiologist is Dr. Meda Coffee , history of CAD . Pt. Took NTG sl prior to arrival with slight relief.

## 2015-10-23 ENCOUNTER — Other Ambulatory Visit: Payer: Self-pay | Admitting: *Deleted

## 2015-10-23 DIAGNOSIS — R079 Chest pain, unspecified: Secondary | ICD-10-CM

## 2015-10-26 ENCOUNTER — Encounter: Payer: Self-pay | Admitting: Physician Assistant

## 2015-10-26 ENCOUNTER — Ambulatory Visit (INDEPENDENT_AMBULATORY_CARE_PROVIDER_SITE_OTHER): Payer: Medicare Other | Admitting: Physician Assistant

## 2015-10-26 VITALS — BP 136/76 | HR 56 | Ht 71.0 in | Wt 164.1 lb

## 2015-10-26 DIAGNOSIS — R001 Bradycardia, unspecified: Secondary | ICD-10-CM

## 2015-10-26 DIAGNOSIS — I2510 Atherosclerotic heart disease of native coronary artery without angina pectoris: Secondary | ICD-10-CM | POA: Diagnosis not present

## 2015-10-26 DIAGNOSIS — I10 Essential (primary) hypertension: Secondary | ICD-10-CM | POA: Diagnosis not present

## 2015-10-26 DIAGNOSIS — R002 Palpitations: Secondary | ICD-10-CM

## 2015-10-26 DIAGNOSIS — R079 Chest pain, unspecified: Secondary | ICD-10-CM | POA: Diagnosis not present

## 2015-10-26 DIAGNOSIS — E785 Hyperlipidemia, unspecified: Secondary | ICD-10-CM

## 2015-10-26 NOTE — Patient Instructions (Addendum)
Medication Instructions:   Your physician recommends that you continue on your current medications as directed. Please refer to the Current Medication list given to you today.   If you need a refill on your cardiac medications before your next appointment, please call your pharmacy.  Labwork: NONE ORDER TODAY    Testing/Procedures: IN EPIC PER [facility]  Your physician has requested that you have a lexiscan myoview. For further information please visit [email or web address]. Please follow instruction sheet, as given.   Follow-Up:  WITH DE NEL[facility] IN 3 MONTHS    Any Other Special Instructions Will Be Listed Below (If Applicable).

## 2015-10-26 NOTE — Progress Notes (Signed)
Cardiology Office Note    Date:  10/26/2015   ID:  Scott [name], Scott [name] 1940/12/04, MRN [id number]  PCP:  Scott Bush, MD  Cardiologist:  Dr. Meda [name]  CC: chest pain   History of Present Illness:  Scott [name] is a 75 y.o. male with a history of CAD s/p MI s/p PTCA (Dx-OM2 proximal concentric stenosis), DM with peripheral nephropathy, CKD, bradycardia and HTN who presents to clinic for evaluation of chest pain.   [name] is a former patient of Dr Scott [name] who underwent 2 cardiac caths in 1990 for chest pain and it is unclear if he underwent any PCI or stenting. He was doing well until 2013 when he was admitted for chest pain, ruled out for ACS and an exercise stress test was negative for ischemia or prior scar. His last echocardiogram showed EF of 55-60% with mild biatrial enlargement. There was no significant valvular heart disease. He was also no pericardial effusion.   He was recently seen in the ER on 10/21/15 for evaluation of chest pain. His wife had recently died and since that time he has had occasional pain over the left lateral breast that is burning in nature. He ruled out for MI and was seen by Dr. Rayann [name] in consult. He was chest pain free and so he was discharged home with plans for outpatient follow up with myoview, which has not been completed yet.   Wife passed 08/06/15 of bone cancer. He has been having a lot of stress due to this. For about the last 2 weeks or so he has been having intermittent palpitations that last for minutes. He hasn't had any in a couple weeks. When he takes his BP during these episodes his SBP will go up ~ 190. Sometimes gets chest tightness that is not a pain but "something he is just aware of" and then he sits and worries about it. He used to be able to go out and do hours of yard work with no problems but now he is feeling like won't do that anymore because he just wants to sit inside and think about his wife. He gets intermittent chest pain that is  not related to exertion. No associated SOB or DOE. He took a SL NTG and it doesn't seem to help. No LE edema, orthopnea or PND. No dizziness or SOB. Keeps a log of his BP and BS which have all been under good control.     Past Medical History:  Diagnosis Date  . Acquired hand deformity 1962   hand saw accident at work  . Anemia 10/11/2011  . Arthritis   . Bradycardia 10/10/2011  . CAD (coronary artery disease) 10/10/2011   MI s/p PTCA (Dx-OM2 proximal concentric stenosis)  . CKD (chronic kidney disease) stage 3, GFR 30-59 ml/min    Scott [name]  . Colon polyps   . Generalized headaches    frequent  . Glaucoma    s/p laser surgery  . HLD (hyperlipidemia)   . Hypertension   . Hypothyroidism 10/10/2011  . Thrombocytopenia (Scott [name]) 10/11/2011  . Type 2 diabetes with nephropathy Scott [name])    DM refresher course [facility] (04/2013)    Past Surgical History:  Procedure Laterality Date  . BACK SURGERY     cervical neck  . CATARACT EXTRACTION, BILATERAL Bilateral 2017  . COLONOSCOPY  11/2008   1 polyp, diverticulosis, rec rpt 5 yrs (Dr. Oletta [name], Scott [name])  . COLONOSCOPY  06/2014   hyperplastic polyp, rpt 5 yrs (Scott [name])  .  EYE SURGERY  2012   laser surgery for glaucoma  . PTCA  1994, 1995  . US ECHOCARDIOGRAPHY  10/2013   inferior wall hypokinesis, mild LVH, EF 50-55%, mild MR and LA dilation    Current Medications: Outpatient Medications Prior to Visit  Medication Sig Dispense Refill  . acetaminophen (TYLENOL) 500 MG tablet Take 500-1,000 mg by mouth daily.     Marland Kitchen amLODipine (NORVASC) 2.5 MG tablet TAKE ONE TABLET BY MOUTH ONCE DAILY 90 tablet 3  . aspirin 325 MG tablet Take 325 mg by mouth at bedtime.     . carvedilol (COREG) 6.25 MG tablet TAKE ONE TABLET BY MOUTH TWICE DAILY 60 tablet 10  . furosemide (LASIX) 40 MG tablet Take 20 mg by mouth daily as needed for fluid (ankle swelling).     Marland Kitchen glucose blood (ONE TOUCH ULTRA TEST) test strip Use to check sugar twice daily and as needed Dx: E11.21; E11.65  100 each 6  . insulin aspart (NOVOLOG FLEXPEN) 100 UNIT/ML FlexPen Inject 5 Units into the skin 3 (three) times daily with meals. 15 mL 3  . Insulin Detemir (LEVEMIR FLEXTOUCH) 100 UNIT/ML Pen Inject 30 Units into the skin daily at 10 pm. 9 mL 11  . levothyroxine (SYNTHROID, LEVOTHROID) 50 MCG tablet TAKE ONE TABLET BY MOUTH ONCE DAILY 90 tablet 3  . lisinopril (PRINIVIL,ZESTRIL) 20 MG tablet Take 1 tablet (20 mg total) by mouth daily. 90 tablet 2  . niacin (SLO-NIACIN) 500 MG tablet Take 1,000 mg by mouth at bedtime.     . nitroGLYCERIN (NITROSTAT) 0.4 MG SL tablet Place 1 tablet (0.4 mg total) under the tongue every 5 (five) minutes as needed for chest pain ((MAX of 3 doses)). 25 tablet 0  . zolpidem (AMBIEN) 10 MG tablet TAKE ONE-HALF TABLET BY MOUTH AT BEDTIME 30 tablet 0  . atorvastatin (LIPITOR) 80 MG tablet TAKE ONE TABLET BY MOUTH ONCE DAILY (Patient taking differently: TAKE ONE TABLET BY MOUTH ONCE DAILY AT BEDTIME) 90 tablet 3   No facility-administered medications prior to visit.      Allergies:   Review of patient's allergies indicates no known allergies.   Social History   Social History  . Marital status: Married    Spouse name: N/A  . Number of children: N/A  . Years of education: N/A   Social History Main Topics  . Smoking status: Former Smoker    Quit date: 03/04/1978  . Smokeless tobacco: Never Used  . Alcohol use No  . Drug use: No  . Sexual activity: Not Asked   Other Topics Concern  . None   Social History Narrative   Lives with wife, 1 dog   Occupation: retired, was route Hotel manager   Edu: 11th gr   Activity: walks dog (pomeranian) 3-4x/day, yardwork, rides bicycle.   Diet: drinks diet coke, no vegetables     Family History:  The patient's family history includes Coronary artery disease (age of onset: 58) in his son; Diabetes in his sister; Hyperlipidemia in his sister; Other in his brother; Pneumonia in his father; Stomach cancer (age of onset: 58) in his  sister.     ROS:   Please see the history of present illness.    ROS All other systems reviewed and are negative.   PHYSICAL EXAM:   VS:  BP 136/76   Pulse (!) 56   Ht 5\' 11"  (1.803 m)   Wt 164 lb 1.9 oz (74.4 kg)   SpO2 98%   BMI  22.89 kg/m    GEN: Well nourished, well developed, in no acute distress  HEENT: normal  Neck: no JVD, carotid bruits, or masses Cardiac: RRR; no murmurs, rubs, or gallops,no edema  Respiratory:  clear to auscultation bilaterally, normal work of breathing GI: soft, nontender, nondistended, + BS MS: no deformity or atrophy  Skin: warm and dry, no rash Neuro:  Alert and Oriented x 3, Strength and sensation are intact Psych: euthymic mood, full affect  Wt Readings from Last 3 Encounters:  10/26/15 164 lb 1.9 oz (74.4 kg)  10/21/15 169 lb (76.7 kg)  10/10/15 167 lb 8 oz (76 kg)      Studies/Labs Reviewed:   EKG:  EKG is NOT ordered today.    Recent Labs: 02/01/2015: TSH 1.65 06/09/2015: ALT 12 10/21/2015: BUN 32; Creatinine, Ser 1.40; Hemoglobin 12.2; Platelets 134; Potassium 4.1; Sodium 144   Lipid Panel    Component Value Date/Time   CHOL 136 02/01/2015 0923   CHOL 153 09/04/2011   TRIG 70.0 02/01/2015 0923   TRIG 78 09/04/2011   HDL 49.40 02/01/2015 0923   CHOLHDL 3 02/01/2015 0923   VLDL 14.0 02/01/2015 0923   LDLCALC 73 02/01/2015 0923   LDLDIRECT 84 09/04/2011    Additional studies/ records that were reviewed today include:  Echo 10/26/2013 - Left ventricle: Inferior wall hypokinesis. The cavity size was normal. Wall thickness was increased in a pattern of mild LVH. Systolic function was normal. The estimated ejection fraction was in the range of 50% to 55%. - Mitral valve: There was mild regurgitation. - Left atrium: The atrium was mildly dilated. - Atrial septum: No defect or patent foramen ovale was identified.  ECG: SR, old inferior MI, otherwise normal  Lexiscan nuclear stress test: 11/23/2013 Impression Exercise  Capacity: Lexiscan with low level exercise. BP Response: Normal blood pressure response. Clinical Symptoms: Anxiety, shortness of breath ECG Impression: No significant ST segment change suggestive of ischemia. Comparison with Prior Nuclear Study: No images to compare  Overall Impression: Low risk stress nuclear study with no significant ischemia identified. Perfusion images suggestive of left ventricular hypertrophy.. LV Ejection Fraction: 58%. LV Wall Motion: NL LV Function; NL Wall Motion    ASSESSMENT & PLAN:   Chest pain: both typical but mostly atypical. Will set up lexiscan myoview (cannot walk due to arthritis). I think a lot of his sx are from bereavement due to his wifes passing.   Palpitations: I think these are related to anxiety. He has not had recurrence in ~2 weeks. If he has them again we will set up a 30 day cardiac monitor  CAD: prior intervention possible PCTA? Normal Lexiscan stress test in 11/2013. Will update stress test as above. Continue ASA, moderate dose rosuvastatin, lisinopril, and carvedilol. Preserved LVEF.  HTN: controlled, continue the same meds.   Hyperlipidemia: continue atorvastatin and niacin  Sinus bradycardia: asymptomatic, we will continue carvedilol 6.25mg  BID    Medication Adjustments/Labs and Tests Ordered: Current medicines are reviewed at length with the patient today.  Concerns regarding medicines are outlined above.  Medication changes, Labs and Tests ordered today are listed in the Patient Instructions below. Patient Instructions  Medication Instructions:   Your physician recommends that you continue on your current medications as directed. Please refer to the Current Medication list given to you today.   If you need a refill on your cardiac medications before your next appointment, please call your pharmacy.  Labwork: NONE ORDER TODAY    Testing/Procedures: IN EPIC PER ALLRED  Your physician has requested that you have  a lexiscan myoview. For further information please visit [email or web address]. Please follow instruction sheet, as given.   Follow-Up:  WITH DE NELSON IN 3 MONTHS    Any Other Special Instructions Will Be Listed Below (If Applicable).                                                                                                                                                      Mable Fill, PA-C  10/26/2015 11:37 AM    [facility] [address], [place], [place]  [address] Phone: [phone]; Fax: [phone]

## 2015-11-01 ENCOUNTER — Telehealth (HOSPITAL_COMMUNITY): Payer: Self-pay | Admitting: *Deleted

## 2015-11-01 NOTE — Telephone Encounter (Signed)
Attempted to call patient regarding upcoming appointment- no answer, unable to leave message- mailbox full.  Scott [name]

## 2015-11-02 ENCOUNTER — Telehealth (HOSPITAL_COMMUNITY): Payer: Self-pay | Admitting: *Deleted

## 2015-11-02 NOTE — Telephone Encounter (Signed)
Patient given detailed instructions per Myocardial Perfusion Study Information Sheet for the test on 11/07/15. Patient notified to arrive 15 minutes early and that it is imperative to arrive on time for appointment to keep from having the test rescheduled.  If you need to cancel or reschedule your appointment, please call the office within 24 hours of your appointment. Failure to do so may result in a cancellation of your appointment, and a $50 no show fee. Patient verbalized understanding. Kirstie Peri

## 2015-11-07 ENCOUNTER — Ambulatory Visit (HOSPITAL_COMMUNITY): Payer: Medicare Other | Attending: Cardiovascular Disease

## 2015-11-07 DIAGNOSIS — I2510 Atherosclerotic heart disease of native coronary artery without angina pectoris: Secondary | ICD-10-CM | POA: Diagnosis not present

## 2015-11-07 DIAGNOSIS — R002 Palpitations: Secondary | ICD-10-CM | POA: Insufficient documentation

## 2015-11-07 DIAGNOSIS — R079 Chest pain, unspecified: Secondary | ICD-10-CM | POA: Insufficient documentation

## 2015-11-07 DIAGNOSIS — I10 Essential (primary) hypertension: Secondary | ICD-10-CM | POA: Insufficient documentation

## 2015-11-07 DIAGNOSIS — E109 Type 1 diabetes mellitus without complications: Secondary | ICD-10-CM | POA: Diagnosis not present

## 2015-11-07 DIAGNOSIS — R9439 Abnormal result of other cardiovascular function study: Secondary | ICD-10-CM | POA: Insufficient documentation

## 2015-11-07 LAB — MYOCARDIAL PERFUSION IMAGING
CHL CUP NUCLEAR SDS: 3
CHL CUP NUCLEAR SRS: 2
CHL CUP RESTING HR STRESS: 55 {beats}/min
CSEPPHR: 91 {beats}/min
LV dias vol: 132 mL (ref 62–150)
LV sys vol: 56 mL
RATE: 0.2
SSS: 5
TID: 0.89

## 2015-11-07 MED ORDER — TECHNETIUM TC 99M TETROFOSMIN IV KIT
10.4000 | PACK | Freq: Once | INTRAVENOUS | Status: AC | PRN
  Administered 2015-11-07: 10 via INTRAVENOUS
  Filled 2015-11-07: qty 10

## 2015-11-07 MED ORDER — TECHNETIUM TC 99M TETROFOSMIN IV KIT
31.7000 | PACK | Freq: Once | INTRAVENOUS | Status: AC | PRN
  Administered 2015-11-07: 31.7 via INTRAVENOUS
  Filled 2015-11-07: qty 32

## 2015-11-07 MED ORDER — REGADENOSON 0.4 MG/5ML IV SOLN
0.4000 mg | Freq: Once | INTRAVENOUS | Status: AC
  Administered 2015-11-07: 0.4 mg via INTRAVENOUS

## 2015-11-13 ENCOUNTER — Encounter: Payer: Self-pay | Admitting: Emergency Medicine

## 2015-11-13 ENCOUNTER — Emergency Department
Admission: EM | Admit: 2015-11-13 | Discharge: 2015-11-13 | Disposition: A | Discharge status: Home / Self Care | Payer: Medicare Other | Attending: Student in an Organized Health Care Education/Training Program | Admitting: Student in an Organized Health Care Education/Training Program

## 2015-11-13 DIAGNOSIS — E1122 Type 2 diabetes mellitus with diabetic chronic kidney disease: Secondary | ICD-10-CM | POA: Insufficient documentation

## 2015-11-13 DIAGNOSIS — Z794 Long term (current) use of insulin: Secondary | ICD-10-CM | POA: Diagnosis not present

## 2015-11-13 DIAGNOSIS — E039 Hypothyroidism, unspecified: Secondary | ICD-10-CM | POA: Diagnosis not present

## 2015-11-13 DIAGNOSIS — I2510 Atherosclerotic heart disease of native coronary artery without angina pectoris: Secondary | ICD-10-CM | POA: Diagnosis not present

## 2015-11-13 DIAGNOSIS — R040 Epistaxis: Secondary | ICD-10-CM | POA: Insufficient documentation

## 2015-11-13 DIAGNOSIS — I129 Hypertensive chronic kidney disease with stage 1 through stage 4 chronic kidney disease, or unspecified chronic kidney disease: Secondary | ICD-10-CM | POA: Diagnosis not present

## 2015-11-13 DIAGNOSIS — Z87891 Personal history of nicotine dependence: Secondary | ICD-10-CM | POA: Diagnosis not present

## 2015-11-13 DIAGNOSIS — N183 Chronic kidney disease, stage 3 (moderate): Secondary | ICD-10-CM | POA: Diagnosis not present

## 2015-11-13 DIAGNOSIS — Z79899 Other long term (current) drug therapy: Secondary | ICD-10-CM | POA: Diagnosis not present

## 2015-11-13 DIAGNOSIS — D649 Anemia, unspecified: Secondary | ICD-10-CM | POA: Insufficient documentation

## 2015-11-13 LAB — CBC WITH DIFFERENTIAL/PLATELET
BASOS ABS: 0 10*3/uL (ref 0–0.1)
EOS ABS: 0.1 10*3/uL (ref 0–0.7)
HCT: 33 % — ABNORMAL LOW (ref 40.0–52.0)
HEMOGLOBIN: 11.5 g/dL — AB (ref 13.0–18.0)
Lymphocytes Relative: 44 %
Lymphs Abs: 4.1 10*3/uL — ABNORMAL HIGH (ref 1.0–3.6)
MCH: 32.2 pg (ref 26.0–34.0)
MCHC: 34.9 g/dL (ref 32.0–36.0)
MCV: 92.3 fL (ref 80.0–100.0)
Monocytes Absolute: 0.5 10*3/uL (ref 0.2–1.0)
Monocytes Relative: 5 %
Neutro Abs: 4.6 10*3/uL (ref 1.4–6.5)
PLATELETS: 93 10*3/uL — AB (ref 150–440)
RBC: 3.58 MIL/uL — AB (ref 4.40–5.90)
RDW: 14.9 % — ABNORMAL HIGH (ref 11.5–14.5)
WBC: 9.4 10*3/uL (ref 3.8–10.6)

## 2015-11-13 LAB — BASIC METABOLIC PANEL
ANION GAP: 6 (ref 5–15)
BUN: 26 mg/dL — AB (ref 6–20)
CHLORIDE: 107 mmol/L (ref 101–111)
CO2: 29 mmol/L (ref 22–32)
Calcium: 9.9 mg/dL (ref 8.9–10.3)
Creatinine, Ser: 1.21 mg/dL (ref 0.61–1.24)
GFR, EST NON AFRICAN AMERICAN: 57 mL/min — AB (ref >60)
Glucose, Bld: 118 mg/dL — ABNORMAL HIGH (ref 65–99)
POTASSIUM: 4.1 mmol/L (ref 3.5–5.1)
SODIUM: 142 mmol/L (ref 135–145)

## 2015-11-13 MED ORDER — OXYMETAZOLINE HCL 0.05 % NA SOLN
NASAL | Status: AC
  Administered 2015-11-13: 1 via NASAL
  Filled 2015-11-13: qty 15

## 2015-11-13 MED ORDER — OXYMETAZOLINE HCL 0.05 % NA SOLN
1.0000 | Freq: Once | NASAL | Status: AC
  Administered 2015-11-13: 1 via NASAL

## 2015-11-13 NOTE — Discharge Instructions (Signed)
Use nasal clamp as directed

## 2015-11-13 NOTE — ED Triage Notes (Signed)
Patient presents to the ED with nosebleed that began at 1am.  Patient states, "it's been dripping like a faucette."  Patient reports history of htn and diabetes.  Patient states his blood sugar was 267 this morning prior to taking insulin.  Patient has a cotton ball in his nose and bleeding appears controlled.

## 2015-11-13 NOTE — ED Provider Notes (Signed)
[facility] Emergency Department Provider Note   ____________________________________________   First MD Initiated Contact with Patient 11/13/15 1134     (approximate)  I have reviewed the triage vital signs and the nursing notes.   HISTORY  Chief Complaint Epistaxis    HPI Scott [name] is a 75 y.o. male patient complaining of nose bleed that began approximately 1 AM this morning. Patient states she continue having nosebleed. Patient does not want any medication for blood thinners but does take aspirin 800 mg daily. Patient stated there is nose bleed about every 3-4 months. However he was unable to control this episode. Patient arrived to the ED with minimal bleeding at this time. Except for nasal pressure no other palliative measures for this complaint.   Past Medical History:  Diagnosis Date  . Acquired hand deformity 1962   hand saw accident at work  . Anemia 10/11/2011  . Arthritis   . Bradycardia 10/10/2011  . CAD (coronary artery disease) 10/10/2011   MI s/p PTCA (Dx-OM2 proximal concentric stenosis)  . CKD (chronic kidney disease) stage 3, GFR 30-59 ml/min    [name]  . Colon polyps   . Generalized headaches    frequent  . Glaucoma    s/p laser surgery  . HLD (hyperlipidemia)   . Hypertension   . Hypothyroidism 10/10/2011  . Thrombocytopenia ([place]) 10/11/2011  . Type 2 diabetes with nephropathy [facility])    DM refresher course [facility] (04/2013)    Patient Active Problem List   Diagnosis Date Noted  . Diabetic neuropathy ([place]) 06/09/2015  . BPV (benign positional vertigo) 05/23/2014  . Advanced care planning/counseling discussion 01/25/2014  . Chest pain 10/26/2013  . Medicare annual wellness visit, subsequent 07/06/2012  . HLD (hyperlipidemia)   . CKD (chronic kidney disease) stage 3, GFR 30-59 ml/min   . Uncontrolled type 2 diabetes mellitus with nephropathy ([place])   . Anemia in CKD (chronic kidney disease) 10/11/2011  . Thrombocytopenia  ([place]) 10/11/2011  . CAD (coronary artery disease) 10/10/2011  . Hypertension 10/10/2011  . Hypothyroidism 10/10/2011    Past Surgical History:  Procedure Laterality Date  . BACK SURGERY     cervical neck  . CATARACT EXTRACTION, BILATERAL Bilateral 2017  . COLONOSCOPY  11/2008   1 polyp, diverticulosis, rec rpt 5 yrs (Dr. Oletta Lamas, [name])  . COLONOSCOPY  06/2014   hyperplastic polyp, rpt 5 yrs ([place])  . EYE SURGERY  2012   laser surgery for glaucoma  . PTCA  1994, 1995  . US ECHOCARDIOGRAPHY  10/2013   inferior wall hypokinesis, mild LVH, EF 50-55%, mild MR and LA dilation    Prior to Admission medications   Medication Sig Start Date End Date Taking? Authorizing Provider  acetaminophen (TYLENOL) 500 MG tablet Take 500-1,000 mg by mouth daily.    Yes Historical Provider, MD  amLODipine (NORVASC) 2.5 MG tablet TAKE ONE TABLET BY MOUTH ONCE DAILY 04/20/15  Yes [name], MD  aspirin 325 MG tablet Take 325 mg by mouth at bedtime.    Yes Historical Provider, MD  atorvastatin (LIPITOR) 80 MG tablet Take 80 mg by mouth at bedtime.   Yes Historical Provider, MD  carvedilol (COREG) 6.25 MG tablet TAKE ONE TABLET BY MOUTH TWICE DAILY 01/30/15  Yes Dorothy Spark, MD  furosemide (LASIX) 40 MG tablet Take 20 mg by mouth daily as needed for fluid (ankle swelling).  02/08/15  Yes [name], MD  glucose blood (ONE TOUCH ULTRA TEST) test strip Use to  check sugar twice daily and as needed Dx: E11.21; E11.65 01/11/15  Yes [name], MD  insulin aspart (NOVOLOG FLEXPEN) 100 UNIT/ML FlexPen Inject 5 Units into the skin 3 (three) times daily with meals. 05/04/15  Yes [name], MD  Insulin Detemir (LEVEMIR FLEXTOUCH) 100 UNIT/ML Pen Inject 30 Units into the skin daily at 10 pm. 03/20/15  Yes [name], MD  levothyroxine (SYNTHROID, LEVOTHROID) 50 MCG tablet TAKE ONE TABLET BY MOUTH ONCE DAILY 02/22/15  Yes [name], MD  lisinopril (PRINIVIL,ZESTRIL) 20 MG tablet  Take 1 tablet (20 mg total) by mouth daily. 05/23/14  Yes [name], MD  niacin (SLO-NIACIN) 500 MG tablet Take 1,000 mg by mouth at bedtime.    Yes Historical Provider, MD  nitroGLYCERIN (NITROSTAT) 0.4 MG SL tablet Place 1 tablet (0.4 mg total) under the tongue every 5 (five) minutes as needed for chest pain ((MAX of 3 doses)). 02/23/15  Yes [name], MD  zolpidem (AMBIEN) 10 MG tablet TAKE ONE-HALF TABLET BY MOUTH AT BEDTIME 10/03/15  Yes [name], MD    Allergies Review of patient's allergies indicates no known allergies.  Family History  Problem Relation Age of Onset  . Diabetes Sister   . Stomach cancer Sister 27  . Pneumonia Father     caused death  . Other Brother     no communication with brother so unsure of any health conditions  . Coronary artery disease Son 10    5v CABG and stents  . Hyperlipidemia Sister   . Stroke Neg Hx     Social History Social History  Substance Use Topics  . Smoking status: Former Smoker    Quit date: 03/04/1978  . Smokeless tobacco: Never Used  . Alcohol use No    Review of Systems Constitutional: No fever/chills Eyes: No visual changes. ENT: No sore throat. Nosebleed Cardiovascular: Denies chest pain. Respiratory: Denies shortness of breath. Gastrointestinal: No abdominal pain.  No nausea, no vomiting.  No diarrhea.  No constipation. Genitourinary: Negative for dysuria. Musculoskeletal: Negative for back pain. Skin: Negative for rash. Neurological: Negative for headaches, focal weakness or numbness. Endocrine:Hypertension, diabetes, and hyperlipidemia. Hematological/Lymphatic:Thrombocytopenia and anemia.  ____________________________________________   PHYSICAL EXAM:  VITAL SIGNS: ED Triage Vitals  Enc Vitals Group     BP 11/13/15 1044 140/65     Pulse Rate 11/13/15 1044 (!) 55     Resp 11/13/15 1044 18     Temp 11/13/15 1044 97.7 F (36.5 C)     Temp Source 11/13/15 1044 Oral     SpO2 11/13/15 1044  100 %     Weight 11/13/15 1045 165 lb (74.8 kg)     Height 11/13/15 1045 5\' 11"  (1.803 m)     Head Circumference --      Peak Flow --      Pain Score 11/13/15 1044 0     Pain Loc --      Pain Edu? --      Excl. in [place]? --     Constitutional: Alert and oriented. Well appearing and in no acute distress. Eyes: Conjunctivae are normal. PERRL. EOMI. Head: Atraumatic. Nose: No congestion/rhinnorhea. Bleeding from left nostril Mouth/Throat: Mucous membranes are moist.  Oropharynx non-erythematous. Neck: No stridor.  No cervical spine tenderness to palpation. Hematological/Lymphatic/Immunilogical: No cervical lymphadenopathy. Cardiovascular: Normal rate, regular rhythm. Grossly normal heart sounds.  Good peripheral circulation. Respiratory: Normal respiratory effort.  No retractions. Lungs CTAB. Gastrointestinal: Soft and nontender. No distention. No abdominal bruits. No CVA tenderness.  Musculoskeletal: No lower extremity tenderness nor edema.  No joint effusions. Neurologic:  Normal speech and language. No gross focal neurologic deficits are appreciated. No gait instability. Skin:  Skin is warm, dry and intact. No rash noted. Psychiatric: Mood and affect are normal. Speech and behavior are normal.  ____________________________________________   LABS (all labs ordered are listed, but only abnormal results are displayed)  Labs Reviewed  CBC WITH DIFFERENTIAL/PLATELET - Abnormal; Notable for the following:       Result Value   RBC 3.58 (*)    Hemoglobin 11.5 (*)    HCT 33.0 (*)    RDW 14.9 (*)    Platelets 93 (*)    Lymphs Abs 4.1 (*)    All other components within normal limits  BASIC METABOLIC PANEL - Abnormal; Notable for the following:    Glucose, Bld 118 (*)    BUN 26 (*)    GFR calc non Af Amer 57 (*)    All other components within normal limits  PROTIME-INR    ____________________________________________  EKG   ____________________________________________  RADIOLOGY   ____________________________________________   PROCEDURES  Procedure(s) performed: None  Procedures  Critical Care performed: No  ____________________________________________   INITIAL IMPRESSION / ASSESSMENT AND PLAN / ED COURSE  Pertinent labs & imaging results that were available during my care of the patient were reviewed by me and considered in my medical decision making (see chart for details).  Anterior left nostril epistaxis. Patient given discharge Instructions.  Clinical Course  Patient clear nasal passages by blowing and Afrin was sprayed into the nostril. Nasal clamp was applied for 20 minutes.   ____________________________________________   FINAL CLINICAL IMPRESSION(S) / ED DIAGNOSES  Final diagnoses:  Left-sided epistaxis  Anterior epistaxis  Epistaxis, recurrent      NEW MEDICATIONS STARTED DURING THIS VISIT:  New Prescriptions   No medications on file     Note:  This document was prepared using Dragon voice recognition software and may include unintentional dictation errors.    Sable Feil, PA-C 11/13/15 [place], MD 11/13/15 [phone]

## 2015-11-15 ENCOUNTER — Telehealth: Payer: Self-pay | Admitting: *Deleted

## 2015-11-15 NOTE — Telephone Encounter (Signed)
-----   Message from Thompson Grayer, MD sent at 11/13/2015  1:52 PM EDT ----- Results reviewed.  Claiborne Billings, please inform pt of low risk result. Continue current mediacl management. I will route to primary care also.

## 2015-11-15 NOTE — Telephone Encounter (Signed)
COULDN'T LEAVE MESSAGE DUE TO VM NOT BEING SET UP

## 2015-12-13 ENCOUNTER — Other Ambulatory Visit: Payer: Medicare Other | Admitting: *Deleted

## 2015-12-13 DIAGNOSIS — I2510 Atherosclerotic heart disease of native coronary artery without angina pectoris: Secondary | ICD-10-CM | POA: Diagnosis not present

## 2015-12-13 DIAGNOSIS — E7800 Pure hypercholesterolemia, unspecified: Secondary | ICD-10-CM | POA: Diagnosis not present

## 2015-12-13 DIAGNOSIS — I10 Essential (primary) hypertension: Secondary | ICD-10-CM | POA: Diagnosis not present

## 2015-12-13 DIAGNOSIS — E02 Subclinical iodine-deficiency hypothyroidism: Secondary | ICD-10-CM | POA: Diagnosis not present

## 2015-12-13 LAB — COMPREHENSIVE METABOLIC PANEL
ALBUMIN: 3.8 g/dL (ref 3.6–5.1)
ALK PHOS: 48 U/L (ref 40–115)
ALT: 12 U/L (ref 9–46)
AST: 18 U/L (ref 10–35)
BILIRUBIN TOTAL: 0.5 mg/dL (ref 0.2–1.2)
BUN: 33 mg/dL — ABNORMAL HIGH (ref 7–25)
CALCIUM: 9.1 mg/dL (ref 8.6–10.3)
CO2: 24 mmol/L (ref 20–31)
Chloride: 112 mmol/L — ABNORMAL HIGH (ref 98–110)
Creat: 1.3 mg/dL — ABNORMAL HIGH (ref 0.70–1.18)
Glucose, Bld: 137 mg/dL — ABNORMAL HIGH (ref 65–99)
Potassium: 4.5 mmol/L (ref 3.5–5.3)
Sodium: 143 mmol/L (ref 135–146)
Total Protein: 5.8 g/dL — ABNORMAL LOW (ref 6.1–8.1)

## 2015-12-13 LAB — LIPID PANEL
Cholesterol: 124 mg/dL — ABNORMAL LOW (ref 125–200)
HDL: 48 mg/dL (ref >=40)
LDL Cholesterol: 60 mg/dL (ref <130)
TRIGLYCERIDES: 80 mg/dL (ref <150)
Total CHOL/HDL Ratio: 2.6 Ratio (ref <=5.0)
VLDL: 16 mg/dL (ref <30)

## 2015-12-13 LAB — CBC WITH DIFFERENTIAL/PLATELET
BASOS PCT: 0 %
Basophils Absolute: 0 cells/uL (ref 0–200)
EOS PCT: 2 %
Eosinophils Absolute: 186 cells/uL (ref 15–500)
HCT: 31.4 % — ABNORMAL LOW (ref 38.5–50.0)
Hemoglobin: 10.4 g/dL — ABNORMAL LOW (ref 13.2–17.1)
Lymphocytes Relative: 68 %
Lymphs Abs: 6324 cells/uL — ABNORMAL HIGH (ref 850–3900)
MCH: 30.7 pg (ref 27.0–33.0)
MCHC: 33.1 g/dL (ref 32.0–36.0)
MCV: 92.6 fL (ref 80.0–100.0)
MONOS PCT: 4 %
MPV: 9.9 fL (ref 7.5–12.5)
Monocytes Absolute: 372 cells/uL (ref 200–950)
NEUTROS ABS: 2418 {cells}/uL (ref 1500–7800)
Neutrophils Relative %: 26 %
PLATELETS: 107 10*3/uL — AB (ref 140–400)
RBC: 3.39 MIL/uL — AB (ref 4.20–5.80)
RDW: 13.7 % (ref 11.0–15.0)
WBC: 9.3 10*3/uL (ref 3.8–10.8)

## 2015-12-13 LAB — TSH: TSH: 2.03 m[IU]/L (ref 0.40–4.50)

## 2015-12-13 NOTE — Addendum Note (Signed)
Addended by: Eulis Foster on: 12/13/2015 08:59 AM   Modules accepted: Orders

## 2015-12-14 LAB — PATHOLOGIST SMEAR REVIEW

## 2015-12-16 ENCOUNTER — Other Ambulatory Visit: Payer: Self-pay | Admitting: Family Medicine

## 2015-12-19 ENCOUNTER — Telehealth: Payer: Self-pay | Admitting: Family Medicine

## 2015-12-19 DIAGNOSIS — N183 Chronic kidney disease, stage 3 (moderate): Secondary | ICD-10-CM

## 2015-12-19 DIAGNOSIS — D696 Thrombocytopenia, unspecified: Secondary | ICD-10-CM

## 2015-12-19 DIAGNOSIS — D631 Anemia in chronic kidney disease: Secondary | ICD-10-CM

## 2015-12-19 MED ORDER — ZOLPIDEM TARTRATE 10 MG PO TABS: 5.0000 mg | ORAL_TABLET | Freq: Every day | ORAL | 0 refills | Status: DC

## 2015-12-19 NOTE — Telephone Encounter (Signed)
plz phone in. 

## 2015-12-19 NOTE — Telephone Encounter (Signed)
Rx called in as directed.   

## 2015-12-19 NOTE — Telephone Encounter (Signed)
Pt called wanting to know if you received lab work from dr Meda Coffee  (cardology)  They told him is blood was low and he wanted to know what he needed to do

## 2015-12-20 ENCOUNTER — Ambulatory Visit: Payer: Medicare Other | Admitting: Cardiology

## 2015-12-20 NOTE — Telephone Encounter (Signed)
Pt called again checking to see if you received lab work form dr Meda Coffee Best number [phone] Please call pt

## 2015-12-21 NOTE — Telephone Encounter (Signed)
Yes labs reviewed. Let's have him come in for further labwork at his convenience and then 1 wk later for office visit. Thank you.

## 2015-12-21 NOTE — Telephone Encounter (Signed)
Pt returned call - would like call back, declined to schedule appt with me.  Thanks

## 2015-12-21 NOTE — Telephone Encounter (Signed)
Message left advising patient to call and schedule appts.

## 2015-12-22 NOTE — Telephone Encounter (Signed)
Message left for patient to return call.

## 2015-12-25 NOTE — Telephone Encounter (Signed)
Message left for patient to return my call.  

## 2015-12-27 ENCOUNTER — Other Ambulatory Visit (INDEPENDENT_AMBULATORY_CARE_PROVIDER_SITE_OTHER): Payer: Medicare Other

## 2015-12-27 DIAGNOSIS — D696 Thrombocytopenia, unspecified: Secondary | ICD-10-CM

## 2015-12-27 DIAGNOSIS — N183 Chronic kidney disease, stage 3 unspecified: Secondary | ICD-10-CM

## 2015-12-27 DIAGNOSIS — D631 Anemia in chronic kidney disease: Secondary | ICD-10-CM | POA: Diagnosis not present

## 2015-12-27 LAB — IBC PANEL
Iron: 56 ug/dL (ref 42–165)
SATURATION RATIOS: 17.7 % — AB (ref 20.0–50.0)
TRANSFERRIN: 226 mg/dL (ref 212.0–360.0)

## 2015-12-27 LAB — FERRITIN: Ferritin: 34.4 ng/mL (ref 22.0–322.0)

## 2015-12-27 NOTE — Telephone Encounter (Signed)
Appts scheduled:

## 2015-12-29 LAB — PROTEIN ELECTROPHORESIS, SERUM, WITH REFLEX
ALPHA-1-GLOBULIN: 0.2 g/dL (ref 0.2–0.3)
ALPHA-2-GLOBULIN: 0.7 g/dL (ref 0.5–0.9)
Albumin ELP: 4.2 g/dL (ref 3.8–4.8)
BETA GLOBULIN: 0.4 g/dL (ref 0.4–0.6)
Beta 2: 0.3 g/dL (ref 0.2–0.5)
Gamma Globulin: 0.7 g/dL — ABNORMAL LOW (ref 0.8–1.7)
Total Protein, Serum Electrophoresis: 6.4 g/dL (ref 6.1–8.1)

## 2016-01-01 ENCOUNTER — Other Ambulatory Visit: Payer: Self-pay | Admitting: Cardiology

## 2016-01-02 LAB — CD57, CD3, CD8, FLOW CYTOMETRY
CD57+/CD3-/CD8-of % lymphs: 1 % (ref 1–5)
CD57+/CD3-OF % LYMPHS: 2 % (ref 1–10)
CD57+/CD8-of % lymphs: 2 % (ref 1–15)

## 2016-01-03 ENCOUNTER — Encounter: Payer: Self-pay | Admitting: Family Medicine

## 2016-01-03 ENCOUNTER — Ambulatory Visit (INDEPENDENT_AMBULATORY_CARE_PROVIDER_SITE_OTHER): Payer: Medicare Other | Admitting: Family Medicine

## 2016-01-03 VITALS — BP 152/82 | HR 56 | Temp 97.8°F | Wt 165.5 lb

## 2016-01-03 DIAGNOSIS — F5104 Psychophysiologic insomnia: Secondary | ICD-10-CM

## 2016-01-03 DIAGNOSIS — F4320 Adjustment disorder, unspecified: Secondary | ICD-10-CM | POA: Diagnosis not present

## 2016-01-03 DIAGNOSIS — N183 Chronic kidney disease, stage 3 unspecified: Secondary | ICD-10-CM

## 2016-01-03 DIAGNOSIS — I2510 Atherosclerotic heart disease of native coronary artery without angina pectoris: Secondary | ICD-10-CM | POA: Diagnosis not present

## 2016-01-03 DIAGNOSIS — D631 Anemia in chronic kidney disease: Secondary | ICD-10-CM

## 2016-01-03 DIAGNOSIS — D696 Thrombocytopenia, unspecified: Secondary | ICD-10-CM

## 2016-01-03 DIAGNOSIS — F4321 Adjustment disorder with depressed mood: Secondary | ICD-10-CM

## 2016-01-03 DIAGNOSIS — Z23 Encounter for immunization: Secondary | ICD-10-CM

## 2016-01-03 MED ORDER — CARVEDILOL 6.25 MG PO TABS: 6.2500 mg | ORAL_TABLET | Freq: Two times a day (BID) | ORAL | 3 refills | Status: DC

## 2016-01-03 NOTE — Assessment & Plan Note (Signed)
Anemia previously presumed to be related to chronic kidney disease. However recently worsening despite stable kidney function, and with abnormal periph smear - will await flow cytometry testing and discussed possible referral to heme if abnormal. Stable SPEP.  Mild iron deficiency as evidenced by low % sat and low normal ferritin levels - recommended increased iron in diet and starting ferrous sulfate daily. Will also recheck CBC and iron levels next month at CPE.

## 2016-01-03 NOTE — Progress Notes (Signed)
BP (!) 152/82 Comment: No carvedilol x 2 days  Pulse (!) 56   Temp 97.8 F (36.6 C) (Oral)   Wt 165 lb 8 oz (75.1 kg)   BMI 23.08 kg/m    CC: f/u visit Subjective:    Patient ID: ZEPH SISTARE, male    DOB: [birth year], 75 y.o.   MRN: [id number]  HPI: RAHEM DUNNER is a 75 y.o. male presenting on 01/03/2016 for Follow-up   Wife passed away 09-14-2015 - continue struggling with this, notes more trouble at night time. Feels ok during the day. Declines grievance counseling. Ongoing insomnia long term treated with ambien 5mg  nightly. This isn't helping. Declines additional medication.   Recent ER visits for chest pain and epistaxis (resolved with afrin and nasal clamping, no bleeding since). Saw cardiology and lexiscan was ordered - low risk study.   Found to have abnormal CBC - anemia to 10.4, plt at 104, periph smear with absolute lymphocytosis and atypical lymphs suggestive of immunoproliferative process. SPEP normal. Ferritin 34 and %sat 17%. Flow cytometry pending. He did have 20-30lb weight loss over 1 year ago but weight has stabilized in the last several months. Denies night sweats, fatigue, fevers/chills.  Body mass index is 23.08 kg/m.   Relevant past medical, surgical, family and social history reviewed and updated as indicated. Interim medical history since our last visit reviewed. Allergies and medications reviewed and updated. Current Outpatient Prescriptions on File Prior to Visit  Medication Sig  . acetaminophen (TYLENOL) 500 MG tablet Take 500-1,000 mg by mouth daily.   Marland Kitchen amLODipine (NORVASC) 2.5 MG tablet TAKE ONE TABLET BY MOUTH ONCE DAILY  . aspirin 325 MG tablet Take 325 mg by mouth at bedtime.   Marland Kitchen atorvastatin (LIPITOR) 80 MG tablet Take 80 mg by mouth at bedtime.  . furosemide (LASIX) 40 MG tablet Take 20 mg by mouth daily as needed for fluid (ankle swelling).   Marland Kitchen glucose blood (ONE TOUCH ULTRA TEST) test strip Use to check sugar twice daily and as needed Dx:  E11.21; E11.65  . insulin aspart (NOVOLOG FLEXPEN) 100 UNIT/ML FlexPen Inject 5 Units into the skin 3 (three) times daily with meals.  . Insulin Detemir (LEVEMIR FLEXTOUCH) 100 UNIT/ML Pen Inject 30 Units into the skin daily at 10 pm.  . levothyroxine (SYNTHROID, LEVOTHROID) 50 MCG tablet TAKE ONE TABLET BY MOUTH ONCE DAILY  . lisinopril (PRINIVIL,ZESTRIL) 20 MG tablet Take 1 tablet (20 mg total) by mouth daily.  . niacin (SLO-NIACIN) 500 MG tablet Take 1,000 mg by mouth at bedtime.   . nitroGLYCERIN (NITROSTAT) 0.4 MG SL tablet Place 1 tablet (0.4 mg total) under the tongue every 5 (five) minutes as needed for chest pain ((MAX of 3 doses)).  Marland Kitchen zolpidem (AMBIEN) 10 MG tablet Take 0.5 tablets (5 mg total) by mouth at bedtime.   No current facility-administered medications on file prior to visit.     Review of Systems Per HPI unless specifically indicated in ROS section     Objective:    BP (!) 152/82 Comment: No carvedilol x 2 days  Pulse (!) 56   Temp 97.8 F (36.6 C) (Oral)   Wt 165 lb 8 oz (75.1 kg)   BMI 23.08 kg/m   Wt Readings from Last 3 Encounters:  01/03/16 165 lb 8 oz (75.1 kg)  11/13/15 165 lb (74.8 kg)  10/26/15 164 lb 1.9 oz (74.4 kg)    Physical Exam  Constitutional: He appears well-developed and well-nourished. No distress.  Cardiovascular: Regular rhythm, normal heart sounds and intact distal pulses.  Bradycardia present.   No murmur heard. Pulmonary/Chest: Effort normal and breath sounds normal. No respiratory distress. He has no wheezes. He has no rales.  Musculoskeletal: He exhibits no edema.  Skin: Skin is warm and dry. No rash noted.  Nursing note and vitals reviewed.  Results for orders placed or performed in visit on 12/27/15  Ferritin  Result Value Ref Range   Ferritin 34.4 22.0 - 322.0 ng/mL  IBC panel  Result Value Ref Range   Iron 56 42 - 165 ug/dL   Transferrin 226.0 212.0 - 360.0 mg/dL   Saturation Ratios 17.7 (L) 20.0 - 50.0 %  Serum  protein electrophoresis with reflex  Result Value Ref Range   Total Protein, Serum Electrophoresis 6.4 6.1 - 8.1 g/dL   Albumin ELP 4.2 3.8 - 4.8 g/dL   Alpha-1-Globulin 0.2 0.2 - 0.3 g/dL   Alpha-2-Globulin 0.7 0.5 - 0.9 g/dL   Beta Globulin 0.4 0.4 - 0.6 g/dL   Beta 2 0.3 0.2 - 0.5 g/dL   Gamma Globulin 0.7 (L) 0.8 - 1.7 g/dL   Abnormal Protein Band1 NOT DET g/dL   SPE Interp. SEE NOTE    Abnormal Protein Band2 NOT DET g/dL   Abnormal Protein Band3 NOT DET g/dL   Lab Results  Component Value Date   HGBA1C 8.0 (H) 10/10/2015    Lab Results  Component Value Date   WBC 9.3 12/13/2015   HGB 10.4 (L) 12/13/2015   HCT 31.4 (L) 12/13/2015   MCV 92.6 12/13/2015   PLT 107 (L) 12/13/2015       Assessment & Plan:   Problem List Items Addressed This Visit    Anemia in CKD (chronic kidney disease) - Primary    Anemia previously presumed to be related to chronic kidney disease. However recently worsening despite stable kidney function, and with abnormal periph smear - will await flow cytometry testing and discussed possible referral to heme if abnormal. Stable SPEP.  Mild iron deficiency as evidenced by low % sat and low normal ferritin levels - recommended increased iron in diet and starting ferrous sulfate daily. Will also recheck CBC and iron levels next month at [facility].       Chronic insomnia    Ongoing chronic insomnia previously treated with ambien 5mg  nightly - not very effective. Reviewed sleep hygiene.      Grieving    Ongoing mourning over wife's death over summer. Declines counseling or pharmacotherapy.       Thrombocytopenia ([place])    Platelets have fluctuated from 90s -130s over last several years. ?ITP. Await flow cytometry, consider heme referral.        Other Visit Diagnoses    Need for influenza vaccination       Relevant Orders   Flu Vaccine QUAD 36+ mos PF IM (Fluarix & Fluzone Quad PF) (Completed)       Follow up plan: No Follow-up on file.  [name], MD

## 2016-01-03 NOTE — Assessment & Plan Note (Signed)
Ongoing mourning over wife's death over summer. Declines counseling or pharmacotherapy.

## 2016-01-03 NOTE — Assessment & Plan Note (Signed)
Ongoing chronic insomnia previously treated with ambien 5mg  nightly - not very effective. Reviewed sleep hygiene.

## 2016-01-03 NOTE — Progress Notes (Signed)
Pre visit review using our clinic review tool, if applicable. No additional management support is needed unless otherwise documented below in the visit note. 

## 2016-01-03 NOTE — Assessment & Plan Note (Signed)
Platelets have fluctuated from 90s -130s over last several years. ?ITP. Await flow cytometry, consider heme referral.

## 2016-01-03 NOTE — Patient Instructions (Addendum)
Flu shot today We will let you know results of last test and may refer you to blood doctor pending results.  Let me know if you'd like to try different sleep and mood medicine.  I recommend increasing iron in diet (handout provided) and starting iron tablet daily if tolerated (ferrous sulfate 325mg  (65FE)).  Keep follow up physical.

## 2016-01-10 ENCOUNTER — Telehealth: Payer: Self-pay | Admitting: Family Medicine

## 2016-01-10 ENCOUNTER — Other Ambulatory Visit: Payer: Medicare Other

## 2016-01-10 ENCOUNTER — Encounter: Payer: Self-pay | Admitting: Family Medicine

## 2016-01-10 DIAGNOSIS — C9110 Chronic lymphocytic leukemia of B-cell type not having achieved remission: Secondary | ICD-10-CM

## 2016-01-10 DIAGNOSIS — D696 Thrombocytopenia, unspecified: Secondary | ICD-10-CM

## 2016-01-10 DIAGNOSIS — N183 Chronic kidney disease, stage 3 unspecified: Secondary | ICD-10-CM

## 2016-01-10 NOTE — Telephone Encounter (Signed)
Spoke with patient and lab appt scheduled. Spoke with Dr. Darnell Level and he spoke with Terri about labs to draw.

## 2016-01-10 NOTE — Telephone Encounter (Signed)
Called, left message.  Flow cytometry drawn 12/27/2015 was not able to be completed. Pt will need to return for repeat labwork.  I spoke with Dr Marjorie Smolder of [facility] and discussed case, he recommends [facility] order [phone] which is lymphocytic leukemia panel for further evaluation of this.

## 2016-01-13 LAB — CLL LYMPHOMA DIAGNOSTIC PANEL
NUMBER OF MARKERS: 18
VIABILITY: 92 %

## 2016-01-18 DIAGNOSIS — C9110 Chronic lymphocytic leukemia of B-cell type not having achieved remission: Secondary | ICD-10-CM | POA: Insufficient documentation

## 2016-01-18 NOTE — Telephone Encounter (Signed)
Received results today - CLL lymphoma diagnostic panel consistent with chronic lymphocytic leukemia, clonal B cells detected coexpressing CD5, CD23 and surface lambda light chain. CD38 is expressed in 90% of CD19+CD5+ B cells. Will ask to stat scan into chart. Spoke with patient, discussed results, will refer to hematology/oncology, try to expedite.

## 2016-01-18 NOTE — Addendum Note (Signed)
Addended by: [name] on: 01/18/2016 04:45 PM   Modules accepted: Orders

## 2016-01-21 NOTE — Progress Notes (Signed)
[name]  [phone] [phone]) [phone]  ID: [name] OB: 1940/08/09  MR#: [id number]  OIZ#:[id number]  Patient Care Team: [name], MD as PCP - General (Family Medicine)  CHIEF COMPLAINT: CLL.  INTERVAL HISTORY: Patient is a 75 year old male who was noted to have a normal white blood cell, but increased lymphocytes on routine blood work. Subsequent flow cytometry confirmed CLL. He currently is anxious, but otherwise feels well. He has no neurologic complaints. He denies any recent fevers or illnesses. He has a good appetite and denies weight loss. He denies any night sweats. He has noted no lymphadenopathy. He has no chest pain or shortness of breath. He denies any nausea, vomiting, constipation, or diarrhea. He has no urinary complaints. Patient feels at his baseline and offers no further specific complaints today.  REVIEW OF SYSTEMS:   Review of Systems  Constitutional: Negative.  Negative for fever, malaise/fatigue and weight loss.  Respiratory: Negative.  Negative for cough and shortness of breath.   Cardiovascular: Negative.  Negative for chest pain and leg swelling.  Gastrointestinal: Negative.  Negative for abdominal pain.  Genitourinary: Negative.   Musculoskeletal: Negative.   Neurological: Negative.  Negative for weakness.  Psychiatric/Behavioral: The patient is nervous/anxious.     As per HPI. Otherwise, a complete review of systems is negative.  PAST MEDICAL HISTORY: Past Medical History:  Diagnosis Date  . Acquired hand deformity 1962   hand saw accident at work  . Anemia 10/11/2011  . Arthritis   . Bradycardia 10/10/2011  . CAD (coronary artery disease) 10/10/2011   MI s/p PTCA (Dx-OM2 proximal concentric stenosis)  . CKD (chronic kidney disease) stage 3, GFR 30-59 ml/min    [name]  . Colon polyps   . Generalized headaches    frequent  . Glaucoma    s/p laser surgery  . HLD (hyperlipidemia)   . Hypertension   .  Hypothyroidism 10/10/2011  . Thrombocytopenia ([place]) 10/11/2011  . Type 2 diabetes with nephropathy [facility])    DM refresher course [facility] (04/2013)    PAST SURGICAL HISTORY: Past Surgical History:  Procedure Laterality Date  . BACK SURGERY     cervical neck  . CATARACT EXTRACTION, BILATERAL Bilateral 2017  . COLONOSCOPY  11/2008   1 polyp, diverticulosis, rec rpt 5 yrs (Dr. Oletta Lamas, [name])  . COLONOSCOPY  06/2014   hyperplastic polyp, rpt 5 yrs ([place])  . EYE SURGERY  2012   laser surgery for glaucoma  . PTCA  1994, 1995  . US ECHOCARDIOGRAPHY  10/2013   inferior wall hypokinesis, mild LVH, EF 50-55%, mild MR and LA dilation    FAMILY HISTORY: Family History  Problem Relation Age of Onset  . Diabetes Sister   . Stomach cancer Sister 24  . Pneumonia Father     caused death  . Other Brother     no communication with brother so unsure of any health conditions  . Coronary artery disease Son 57    5v CABG and stents  . Hyperlipidemia Sister   . Stroke Neg Hx     ADVANCED DIRECTIVES (Y/N):  N  HEALTH MAINTENANCE: Social History  Substance Use Topics  . Smoking status: Former Smoker    Quit date: 03/04/1978  . Smokeless tobacco: Never Used  . Alcohol use No     Colonoscopy:  PAP:  Bone density:  Lipid panel:  No Known Allergies  Current Outpatient Prescriptions  Medication Sig Dispense Refill  . acetaminophen (TYLENOL) 500 MG tablet Take  500-1,000 mg by mouth daily.     Marland Kitchen amLODipine (NORVASC) 2.5 MG tablet TAKE ONE TABLET BY MOUTH ONCE DAILY 90 tablet 3  . aspirin 325 MG tablet Take 325 mg by mouth at bedtime.     Marland Kitchen atorvastatin (LIPITOR) 80 MG tablet Take 80 mg by mouth at bedtime.    . carvedilol (COREG) 6.25 MG tablet Take 1 tablet (6.25 mg total) by mouth 2 (two) times daily. 180 tablet 3  . ferrous sulfate 325 (65 FE) MG tablet Take 325 mg by mouth daily with breakfast.    . furosemide (LASIX) 40 MG tablet Take 40 mg by mouth daily as needed for fluid (ankle  swelling).     . insulin aspart (NOVOLOG FLEXPEN) 100 UNIT/ML FlexPen Inject 5 Units into the skin 3 (three) times daily with meals. 15 mL 3  . Insulin Detemir (LEVEMIR FLEXTOUCH) 100 UNIT/ML Pen Inject 30 Units into the skin daily at 10 pm. 9 mL 11  . levothyroxine (SYNTHROID, LEVOTHROID) 50 MCG tablet TAKE ONE TABLET BY MOUTH ONCE DAILY 90 tablet 3  . lisinopril (PRINIVIL,ZESTRIL) 20 MG tablet Take 1 tablet (20 mg total) by mouth daily. 90 tablet 2  . niacin (SLO-NIACIN) 500 MG tablet Take 1,000 mg by mouth at bedtime.     . nitroGLYCERIN (NITROSTAT) 0.4 MG SL tablet Place 1 tablet (0.4 mg total) under the tongue every 5 (five) minutes as needed for chest pain ((MAX of 3 doses)). 25 tablet 0  . zolpidem (AMBIEN) 10 MG tablet Take 0.5 tablets (5 mg total) by mouth at bedtime. 30 tablet 0   No current facility-administered medications for this visit.     OBJECTIVE: Vitals:   01/22/16 1345  BP: (!) 154/76  Pulse: 60  Resp: 18  Temp: (!) 96.5 F (35.8 C)     Body mass index is 22.57 kg/m.    ECOG FS:0 - Asymptomatic  General: Well-developed, well-nourished, no acute distress. Eyes: Pink conjunctiva, anicteric sclera. HEENT: Normocephalic, moist mucous membranes, clear oropharnyx. Lungs: Clear to auscultation bilaterally. Heart: Regular rate and rhythm. No rubs, murmurs, or gallops. Abdomen: Soft, nontender, nondistended. No organomegaly noted, normoactive bowel sounds. Musculoskeletal: No edema, cyanosis, or clubbing. Neuro: Alert, answering all questions appropriately. Cranial nerves grossly intact. Skin: No rashes or petechiae noted. Psych: Normal affect. Lymphatics: No cervical, calvicular, axillary or inguinal LAD.   LAB RESULTS:  Lab Results  Component Value Date   NA 143 12/13/2015   K 4.5 12/13/2015   CL 112 (H) 12/13/2015   CO2 24 12/13/2015   GLUCOSE 137 (H) 12/13/2015   BUN 33 (H) 12/13/2015   CREATININE 1.30 (H) 12/13/2015   CALCIUM 9.1 12/13/2015   PROT 5.8  (L) 12/13/2015   ALBUMIN 3.8 12/13/2015   AST 18 12/13/2015   ALT 12 12/13/2015   ALKPHOS 48 12/13/2015   BILITOT 0.5 12/13/2015   GFRNONAA 57 (L) 11/13/2015   GFRAA >60 11/13/2015    Lab Results  Component Value Date   WBC 9.3 12/13/2015   NEUTROABS 2,418 12/13/2015   HGB 10.4 (L) 12/13/2015   HCT 31.4 (L) 12/13/2015   MCV 92.6 12/13/2015   PLT 107 (L) 12/13/2015     STUDIES: No results found.  ASSESSMENT: CLL, Rai stage 0.  PLAN:    1. CLL: Although patient has a normal white blood cell count, his lymphocyte percentage was significantly elevated. CLL confirmed by peripheral blood flow cytometry. No intervention is needed at this time. Can consider imaging with CT scan  in the future to assess for lymphadenopathy, but this is not necessary at this time. Patient does not require bone marrow biopsy. Return to clinic in 3 months with repeat laboratory work and further evaluation. If his blood counts remain stable, he likely can be transitioned to laboratory work and evaluation every 6 months. 2. Thrombocytopenia: Likely secondary to CLL. Patient's final count is greater than 100 therefore he continues to be stage 0. Continue to monitor.  Approximately 35 minutes was spent in discussion of which greater than 50% was consultation.  Patient expressed understanding and was in agreement with this plan. He also understands that He can call clinic at any time with any questions, concerns, or complaints.    Lloyd Huger, MD   01/22/2016 4:20 PM

## 2016-01-22 ENCOUNTER — Inpatient Hospital Stay: Payer: Medicare Other | Attending: Oncology | Admitting: Oncology

## 2016-01-22 VITALS — BP 154/76 | HR 60 | Temp 96.5°F | Resp 18 | Wt 161.8 lb

## 2016-01-22 DIAGNOSIS — D696 Thrombocytopenia, unspecified: Secondary | ICD-10-CM | POA: Diagnosis not present

## 2016-01-22 DIAGNOSIS — I129 Hypertensive chronic kidney disease with stage 1 through stage 4 chronic kidney disease, or unspecified chronic kidney disease: Secondary | ICD-10-CM | POA: Insufficient documentation

## 2016-01-22 DIAGNOSIS — Z79899 Other long term (current) drug therapy: Secondary | ICD-10-CM | POA: Insufficient documentation

## 2016-01-22 DIAGNOSIS — Z87891 Personal history of nicotine dependence: Secondary | ICD-10-CM | POA: Insufficient documentation

## 2016-01-22 DIAGNOSIS — E039 Hypothyroidism, unspecified: Secondary | ICD-10-CM | POA: Insufficient documentation

## 2016-01-22 DIAGNOSIS — E785 Hyperlipidemia, unspecified: Secondary | ICD-10-CM | POA: Insufficient documentation

## 2016-01-22 DIAGNOSIS — N183 Chronic kidney disease, stage 3 (moderate): Secondary | ICD-10-CM | POA: Insufficient documentation

## 2016-01-22 DIAGNOSIS — H409 Unspecified glaucoma: Secondary | ICD-10-CM | POA: Insufficient documentation

## 2016-01-22 DIAGNOSIS — C9110 Chronic lymphocytic leukemia of B-cell type not having achieved remission: Secondary | ICD-10-CM | POA: Diagnosis not present

## 2016-01-22 DIAGNOSIS — E1122 Type 2 diabetes mellitus with diabetic chronic kidney disease: Secondary | ICD-10-CM | POA: Diagnosis not present

## 2016-01-22 DIAGNOSIS — Z7982 Long term (current) use of aspirin: Secondary | ICD-10-CM | POA: Diagnosis not present

## 2016-01-22 DIAGNOSIS — Z794 Long term (current) use of insulin: Secondary | ICD-10-CM | POA: Diagnosis not present

## 2016-01-22 DIAGNOSIS — Z86010 Personal history of colonic polyps: Secondary | ICD-10-CM | POA: Diagnosis not present

## 2016-01-22 DIAGNOSIS — I2510 Atherosclerotic heart disease of native coronary artery without angina pectoris: Secondary | ICD-10-CM | POA: Insufficient documentation

## 2016-01-22 DIAGNOSIS — M1990 Unspecified osteoarthritis, unspecified site: Secondary | ICD-10-CM | POA: Diagnosis not present

## 2016-01-22 NOTE — Progress Notes (Signed)
New evaluation for leukemia. Offers no complaints.

## 2016-02-01 ENCOUNTER — Ambulatory Visit (INDEPENDENT_AMBULATORY_CARE_PROVIDER_SITE_OTHER): Payer: Medicare Other | Admitting: Cardiology

## 2016-02-01 ENCOUNTER — Encounter: Payer: Self-pay | Admitting: Cardiology

## 2016-02-01 VITALS — BP 160/80 | HR 52 | Ht 71.0 in | Wt 165.6 lb

## 2016-02-01 DIAGNOSIS — R001 Bradycardia, unspecified: Secondary | ICD-10-CM

## 2016-02-01 DIAGNOSIS — I10 Essential (primary) hypertension: Secondary | ICD-10-CM

## 2016-02-01 DIAGNOSIS — E785 Hyperlipidemia, unspecified: Secondary | ICD-10-CM

## 2016-02-01 DIAGNOSIS — R002 Palpitations: Secondary | ICD-10-CM | POA: Diagnosis not present

## 2016-02-01 DIAGNOSIS — R072 Precordial pain: Secondary | ICD-10-CM

## 2016-02-01 DIAGNOSIS — I2510 Atherosclerotic heart disease of native coronary artery without angina pectoris: Secondary | ICD-10-CM

## 2016-02-01 MED ORDER — ASPIRIN EC 81 MG PO TBEC: 81.0000 mg | DELAYED_RELEASE_TABLET | Freq: Every day | ORAL | 3 refills | Status: DC

## 2016-02-01 MED ORDER — AMLODIPINE BESYLATE 5 MG PO TABS: 5.0000 mg | ORAL_TABLET | Freq: Every day | ORAL | 3 refills | Status: DC

## 2016-02-01 NOTE — Patient Instructions (Signed)
Medication Instructions:   STOP TAKING ASPIRIN 325 MG NOW  START TAKING ASPIRIN 81 MG ONCE DAILY  INCREASE YOUR AMLODIPINE TO 5 MG ONCE DAILY    Follow-Up:  3 MONTHS WITH DR Meda Coffee      If you need a refill on your cardiac medications before your next appointment, please call your pharmacy.

## 2016-02-01 NOTE — Progress Notes (Signed)
Cardiology Office Note    Date:  02/02/2016   ID:  Scott [name], DOB [birth year], MRN [id number]  PCP:  [name], MD  Cardiologist:  Dr. Meda Coffee  CC: chest pain   History of Present Illness:  Scott [name] is a 75 y.o. male with a history of CAD s/p MI s/p PTCA (Dx-OM2 proximal concentric stenosis), DM with peripheral nephropathy, CKD, bradycardia and HTN who presents to clinic for evaluation of chest pain.   Scott [name] is a former patient of Dr Verl Blalock who underwent 2 cardiac caths in 1990 for chest pain and it is unclear if he underwent any PCI or stenting. He was doing well until 2013 when he was admitted for chest pain, ruled out for ACS and an exercise stress test was negative for ischemia or prior scar. His last echocardiogram showed EF of 55-60% with mild biatrial enlargement. There was no significant valvular heart disease. He was also no pericardial effusion.   He was recently seen in the ER on 10/21/15 for evaluation of chest pain. His wife had recently died and since that time he has had occasional pain over the left lateral breast that is burning in nature. He ruled out for MI and was seen by Dr. Rayann Heman in consult. He was chest pain free and so he was discharged home with plans for outpatient follow up with [facility], which has not been completed yet.   Wife passed 08/06/15 of bone cancer. He has been having a lot of stress due to this. For about the last 2 weeks or so he has been having intermittent palpitations that last for minutes. He hasn't had any in a couple weeks. When he takes his BP during these episodes his SBP will go up ~ 190. Sometimes gets chest tightness that is not a pain but "something he is just aware of" and then he sits and worries about it. He used to be able to go out and do hours of yard work with no problems but now he is feeling like won't do that anymore because he just wants to sit inside and think about his wife. He gets intermittent chest pain that is  not related to exertion. No associated SOB or DOE. He took a SL NTG and it doesn't seem to help. No LE edema, orthopnea or PND. No dizziness or SOB. Keeps a log of his BP and BS which have all been under good control.   02/01/2016 - the patient states that since his last visit he symptoms have improved his chest tightness has improved and palpitations have resolved. He underwent nuclear stress test in September 2017 that showed one segment of ischemia in the apical anterior wall and conservative management was decided. He has been compliant to his meds and denies any symptoms of chest pain or shortness of breath no lower extremity edema orthopnea or proximal nocturnal dyspnea. His concern or nosebleeds, on one occasion he had to go to the ER.  Past Medical History:  Diagnosis Date  . Acquired hand deformity 1962   hand saw accident at work  . Anemia 10/11/2011  . Arthritis   . Bradycardia 10/10/2011  . CAD (coronary artery disease) 10/10/2011   MI s/p PTCA (Dx-OM2 proximal concentric stenosis)  . CKD (chronic kidney disease) stage 3, GFR 30-59 ml/min    [name]  . Colon polyps   . Generalized headaches    frequent  . Glaucoma    s/p laser surgery  . HLD (hyperlipidemia)   .  Hypertension   . Hypothyroidism 10/10/2011  . Thrombocytopenia ([place]) 10/11/2011  . Type 2 diabetes with nephropathy [facility])    DM refresher course [facility] (04/2013)    Past Surgical History:  Procedure Laterality Date  . BACK SURGERY     cervical neck  . CATARACT EXTRACTION, BILATERAL Bilateral 2017  . COLONOSCOPY  11/2008   1 polyp, diverticulosis, rec rpt 5 yrs (Dr. Oletta Lamas, [name])  . COLONOSCOPY  06/2014   hyperplastic polyp, rpt 5 yrs ([facility])  . EYE SURGERY  2012   laser surgery for glaucoma  . PTCA  1994, 1995  . US ECHOCARDIOGRAPHY  10/2013   inferior wall hypokinesis, mild LVH, EF 50-55%, mild MR and LA dilation    Current Medications: Outpatient Medications Prior to Visit  Medication Sig Dispense Refill  .  acetaminophen (TYLENOL) 500 MG tablet Take 500-1,000 mg by mouth daily.     Marland Kitchen atorvastatin (LIPITOR) 80 MG tablet Take 80 mg by mouth at bedtime.    . carvedilol (COREG) 6.25 MG tablet Take 1 tablet (6.25 mg total) by mouth 2 (two) times daily. 180 tablet 3  . ferrous sulfate 325 (65 FE) MG tablet Take 325 mg by mouth daily with breakfast.    . furosemide (LASIX) 40 MG tablet Take 40 mg by mouth daily as needed for fluid (ankle swelling).     . insulin aspart (NOVOLOG FLEXPEN) 100 UNIT/ML FlexPen Inject 5 Units into the skin 3 (three) times daily with meals. 15 mL 3  . Insulin Detemir (LEVEMIR FLEXTOUCH) 100 UNIT/ML Pen Inject 30 Units into the skin daily at 10 pm. 9 mL 11  . levothyroxine (SYNTHROID, LEVOTHROID) 50 MCG tablet TAKE ONE TABLET BY MOUTH ONCE DAILY 90 tablet 3  . lisinopril (PRINIVIL,ZESTRIL) 20 MG tablet Take 1 tablet (20 mg total) by mouth daily. 90 tablet 2  . niacin (SLO-NIACIN) 500 MG tablet Take 1,000 mg by mouth at bedtime.     . nitroGLYCERIN (NITROSTAT) 0.4 MG SL tablet Place 1 tablet (0.4 mg total) under the tongue every 5 (five) minutes as needed for chest pain ((MAX of 3 doses)). 25 tablet 0  . zolpidem (AMBIEN) 10 MG tablet Take 0.5 tablets (5 mg total) by mouth at bedtime. 30 tablet 0  . amLODipine (NORVASC) 2.5 MG tablet TAKE ONE TABLET BY MOUTH ONCE DAILY 90 tablet 3  . aspirin 325 MG tablet Take 325 mg by mouth at bedtime.      No facility-administered medications prior to visit.      Allergies:   Patient has no known allergies.   Social History   Social History  . Marital status: Married    Spouse name: N/A  . Number of children: N/A  . Years of education: N/A   Social History Main Topics  . Smoking status: Former Smoker    Quit date: 03/04/1978  . Smokeless tobacco: Never Used  . Alcohol use No  . Drug use: No  . Sexual activity: Not Asked   Other Topics Concern  . None   Social History Narrative   Widower. Wife passed 08/2015 from cancer. 1 dog     Occupation: retired, was route Hotel manager   Edu: 11th gr   Activity: walks dog (pomeranian) 3-4x/day, yardwork, rides bicycle.   Diet: drinks diet coke, no vegetables     Family History:  The patient's family history includes Coronary artery disease (age of onset: 23) in his son; Diabetes in his sister; Hyperlipidemia in his sister; Other in his brother;  Pneumonia in his father; Stomach cancer (age of onset: 79) in his sister.     ROS:   Please see the history of present illness.    ROS All other systems reviewed and are negative.   PHYSICAL EXAM:   VS:  BP (!) 160/80   Pulse (!) 52   Ht 5\' 11"  (1.803 m)   Wt 165 lb 9.6 oz (75.1 kg)   BMI 23.10 kg/m    GEN: Well nourished, well developed, in no acute distress  HEENT: normal  Neck: no JVD, carotid bruits, or masses Cardiac: RRR; no murmurs, rubs, or gallops,no edema  Respiratory:  clear to auscultation bilaterally, normal work of breathing GI: soft, nontender, nondistended, + BS MS: no deformity or atrophy  Skin: warm and dry, no rash Neuro:  Alert and Oriented x 3, Strength and sensation are intact Psych: euthymic mood, full affect  Wt Readings from Last 3 Encounters:  02/01/16 165 lb 9.6 oz (75.1 kg)  01/22/16 161 lb 13.1 oz (73.4 kg)  01/03/16 165 lb 8 oz (75.1 kg)      Studies/Labs Reviewed:   EKG:  EKG is NOT ordered today.    Recent Labs: 12/13/2015: ALT 12; BUN 33; Creat 1.30; Hemoglobin 10.4; Platelets 107; Potassium 4.5; Sodium 143; TSH 2.03   Lipid Panel    Component Value Date/Time   CHOL 124 (L) 12/13/2015 0859   CHOL 153 09/04/2011   TRIG 80 12/13/2015 0859   TRIG 78 09/04/2011   HDL 48 12/13/2015 0859   CHOLHDL 2.6 12/13/2015 0859   VLDL 16 12/13/2015 0859   LDLCALC 60 12/13/2015 0859   LDLDIRECT 84 09/04/2011    Additional studies/ records that were reviewed today include:  Echo 10/26/2013 - Left ventricle: Inferior wall hypokinesis. The cavity size was normal. Wall thickness was increased  in a pattern of mild LVH. Systolic function was normal. The estimated ejection fraction was in the range of 50% to 55%. - Mitral valve: There was mild regurgitation. - Left atrium: The atrium was mildly dilated. - Atrial septum: No defect or patent foramen ovale was identified.  ECG: SR, old inferior MI, otherwise normal  Lexiscan nuclear stress test: 11/23/2013 Impression Exercise Capacity: Lexiscan with low level exercise. BP Response: Normal blood pressure response. Clinical Symptoms: Anxiety, shortness of breath ECG Impression: No significant ST segment change suggestive of ischemia. Comparison with Prior Nuclear Study: No images to compare  Overall Impression: Low risk stress nuclear study with no significant ischemia identified. Perfusion images suggestive of left ventricular hypertrophy.. LV Ejection Fraction: 58%. LV Wall Motion: NL LV Function; NL Wall Motion  Lexiscan Nuclear stress test: 11/2015  Nuclear stress EF: 58%.  There was no ST segment deviation noted during stress.  Defect 1: There is a small defect of severe severity present in the apex location.  This is a low risk study.  The left ventricular ejection fraction is normal (55-65%).   Low risk stress nuclear study with mild apical thinning and mild apical ischemia; EF 58 with normal wall motion   ASSESSMENT & PLAN:   Chest pain: Minimal ischemia in apical anterior wall, patient is now asymptomatic we'll continue medical management with more intensive blood pressure control.   Palpitations: I think these were related to anxiety after his wife passed, there are now resolved.  CAD: prior intervention possible PCTA? ,  Continue ASA, moderate dose rosuvastatin, lisinopril, and carvedilol. Preserved LVEF.  HTN: uncontrolled, we will increase amlodipine to 5 mg once daily  Hyperlipidemia:  continue atorvastatin and niacin  Sinus bradycardia: asymptomatic, we will continue carvedilol 6.25mg  BID     Medication Adjustments/Labs and Tests Ordered: Current medicines are reviewed at length with the patient today.  Concerns regarding medicines are outlined above.  Medication changes, Labs and Tests ordered today are listed in the Patient Instructions below. Patient Instructions  Medication Instructions:   STOP TAKING ASPIRIN 325 MG NOW  START TAKING ASPIRIN 81 MG ONCE DAILY  INCREASE YOUR AMLODIPINE TO 5 MG ONCE DAILY    Follow-Up:  3 MONTHS WITH DR Meda Coffee      If you need a refill on your cardiac medications before your next appointment, please call your pharmacy.      Signed, Ena Dawley, MD  02/02/2016 11:06 AM    [address], [place], [place]  [address] Phone: [phone]; Fax: [phone]

## 2016-02-02 ENCOUNTER — Other Ambulatory Visit: Payer: Self-pay | Admitting: Family Medicine

## 2016-02-12 ENCOUNTER — Other Ambulatory Visit: Payer: Medicare Other

## 2016-02-12 ENCOUNTER — Ambulatory Visit (INDEPENDENT_AMBULATORY_CARE_PROVIDER_SITE_OTHER): Payer: Medicare Other

## 2016-02-12 VITALS — BP 118/70 | HR 58 | Temp 97.6°F | Ht 70.0 in | Wt 161.5 lb

## 2016-02-12 DIAGNOSIS — Z0000 Encounter for general adult medical examination without abnormal findings: Secondary | ICD-10-CM

## 2016-02-12 NOTE — Progress Notes (Signed)
Subjective:   Scott [name] is a 75 y.o. male who presents for Medicare Annual/Subsequent preventive examination.  Review of Systems:  N/A Cardiac Risk Factors include: advanced age (>63men, >31 women);male gender;dyslipidemia;diabetes mellitus;hypertension     Objective:    Vitals: BP 118/70 (BP Location: Left Arm, Patient Position: Sitting, Cuff Size: Normal)   Pulse (!) 58   Temp 97.6 F (36.4 C) (Oral)   Ht 5\' 10"  (1.778 m) Comment: no shoes  Wt 161 lb 8 oz (73.3 kg)   SpO2 98%   BMI 23.17 kg/m   Body mass index is 23.17 kg/m.  Tobacco History  Smoking Status  . Former Smoker  . Quit date: 03/04/1978  Smokeless Tobacco  . Never Used     Counseling given: No   Past Medical History:  Diagnosis Date  . Acquired hand deformity 1962   hand saw accident at work  . Anemia 10/11/2011  . Arthritis   . Bradycardia 10/10/2011  . CAD (coronary artery disease) 10/10/2011   MI s/p PTCA (Dx-OM2 proximal concentric stenosis)  . CKD (chronic kidney disease) stage 3, GFR 30-59 ml/min    [name]  . Colon polyps   . Generalized headaches    frequent  . Glaucoma    s/p laser surgery  . HLD (hyperlipidemia)   . Hypertension   . Hypothyroidism 10/10/2011  . Thrombocytopenia ([place]) 10/11/2011  . Type 2 diabetes with nephropathy [facility])    DM refresher course [facility] (04/2013)   Past Surgical History:  Procedure Laterality Date  . BACK SURGERY     cervical neck  . CATARACT EXTRACTION, BILATERAL Bilateral 2017  . COLONOSCOPY  11/2008   1 polyp, diverticulosis, rec rpt 5 yrs (Dr. Oletta Lamas, [name])  . COLONOSCOPY  06/2014   hyperplastic polyp, rpt 5 yrs ([facility])  . EYE SURGERY  2012   laser surgery for glaucoma  . PTCA  1994, 1995  . US ECHOCARDIOGRAPHY  10/2013   inferior wall hypokinesis, mild LVH, EF 50-55%, mild MR and LA dilation   Family History  Problem Relation Age of Onset  . Diabetes Sister   . Stomach cancer Sister 25  . Pneumonia Father     caused death  . Other  Brother     no communication with brother so unsure of any health conditions  . Coronary artery disease Son 63    5v CABG and stents  . Hyperlipidemia Sister   . Stroke Neg Hx   . Heart attack Neg Hx    History  Sexual Activity  . Sexual activity: No    Outpatient Encounter Prescriptions as of 02/12/2016  Medication Sig  . acetaminophen (TYLENOL) 500 MG tablet Take 500-1,000 mg by mouth daily.   Marland Kitchen amLODipine (NORVASC) 5 MG tablet Take 1 tablet (5 mg total) by mouth daily.  Marland Kitchen aspirin EC 81 MG tablet Take 1 tablet (81 mg total) by mouth daily.  Marland Kitchen atorvastatin (LIPITOR) 80 MG tablet Take 80 mg by mouth at bedtime.  . carvedilol (COREG) 6.25 MG tablet Take 1 tablet (6.25 mg total) by mouth 2 (two) times daily.  . ferrous sulfate 325 (65 FE) MG tablet Take 325 mg by mouth daily with breakfast.  . furosemide (LASIX) 40 MG tablet Take 40 mg by mouth daily as needed for fluid (ankle swelling).   Marland Kitchen glucose blood (ONE TOUCH ULTRA TEST) test strip Use to check sugar twice daily and as needed Dx:E11.65  . insulin aspart (NOVOLOG FLEXPEN) 100 UNIT/ML FlexPen Inject 5  Units into the skin 3 (three) times daily with meals.  . Insulin Detemir (LEVEMIR FLEXTOUCH) 100 UNIT/ML Pen Inject 30 Units into the skin daily at 10 pm.  . levothyroxine (SYNTHROID, LEVOTHROID) 50 MCG tablet TAKE ONE TABLET BY MOUTH ONCE DAILY  . lisinopril (PRINIVIL,ZESTRIL) 20 MG tablet Take 1 tablet (20 mg total) by mouth daily.  . niacin (SLO-NIACIN) 500 MG tablet Take 1,000 mg by mouth at bedtime.   . nitroGLYCERIN (NITROSTAT) 0.4 MG SL tablet Place 1 tablet (0.4 mg total) under the tongue every 5 (five) minutes as needed for chest pain ((MAX of 3 doses)).  Marland Kitchen zolpidem (AMBIEN) 10 MG tablet Take 0.5 tablets (5 mg total) by mouth at bedtime.   No facility-administered encounter medications on file as of 02/12/2016.     Activities of Daily Living In your present state of health, do you have any difficulty performing the  following activities: 02/12/2016  Hearing? Y  Vision? N  Difficulty concentrating or making decisions? Y  Walking or climbing stairs? Y  Dressing or bathing? N  Doing errands, shopping? N  Preparing Food and eating ? N  Using the Toilet? N  In the past six months, have you accidently leaked urine? N  Do you have problems with loss of bowel control? N  Managing your Medications? N  Managing your Finances? N  Housekeeping or managing your Housekeeping? N  Some recent data might be hidden    Patient Care Team: [name], MD as PCP - General (Family Medicine) Lloyd Huger, MD as Consulting Physician (Oncology) Clent Jacks, MD as Consulting Physician (Ophthalmology) Fleet Contras, MD as Consulting Physician (Nephrology) Dorothy Spark, MD as Consulting Physician (Cardiology)   Assessment:     Hearing Screening   125Hz  250Hz  500Hz  1000Hz  2000Hz  3000Hz  4000Hz  6000Hz  8000Hz   Right ear:   40 40 40  0    Left ear:   40 40 0  0    Vision Screening Comments: Last vision exam in June 2017 with Dr. Katy Fitch   Exercise Activities and Dietary recommendations Current Exercise Habits: Home exercise routine, Type of exercise: treadmill;walking, Time (Minutes): 30, Frequency (Times/Week): 4, Weekly Exercise (Minutes/Week): 120, Intensity: Mild, Exercise limited by: None identified  Goals    . Increase physical activity          Starting 02/12/2016, I will continue to exercise at least 30 min 3-5 days per week.       Fall Risk Fall Risk  02/12/2016 02/08/2015 01/25/2014 07/06/2012  Falls in the past year? No Yes No Yes  Number falls in past yr: - 1 - 1  Injury with Fall? - No - No   Depression Screen PHQ 2/9 Scores 02/12/2016 02/08/2015 01/25/2014 07/06/2012  PHQ - 2 Score 0 0 0 0    Cognitive Function MMSE - Mini Mental State Exam 02/12/2016  Orientation to time 5  Orientation to Place 5  Registration 3  Attention/ Calculation 0  Recall 3  Language- name 2 objects  0  Language- repeat 1  Language- follow 3 step command 3  Language- read & follow direction 0  Write a sentence 0  Copy design 0  Total score 20       PLEASE NOTE: A Mini-Cog screen was completed. Maximum score is 20. A value of 0 denotes this part of Folstein MMSE was not completed or the patient failed this part of the Mini-Cog screening.   Mini-Cog Screening Orientation to Time - Max 5 pts Orientation  to Place - Max 5 pts Registration - Max 3 pts Recall - Max 3 pts Language Repeat - Max 1 pts Language Follow 3 Step Command - Max 3 pts   Immunization History  Administered Date(s) Administered  . Influenza Split 12/03/2011  . Influenza,inj,Quad PF,36+ Mos 11/06/2012, 01/25/2014, 02/08/2015, 01/03/2016  . Pneumococcal Conjugate-13 07/21/2013  . Pneumococcal Polysaccharide-23 03/11/2011  . Zoster 03/17/2013   Screening Tests Health Maintenance  Topic Date Due  . DTaP/Tdap/Td (1 - Tdap) 02/11/2026 (Originally 06/02/1959)  . TETANUS/TDAP  02/11/2026 (Originally 06/02/1959)  . HEMOGLOBIN A1C  04/11/2016  . OPHTHALMOLOGY EXAM  05/10/2016  . FOOT EXAM  06/08/2016  . COLONOSCOPY  06/08/2019  . INFLUENZA VACCINE  Completed  . ZOSTAVAX  Completed  . PNA vac Low Risk Adult  Completed      Plan:     I have personally reviewed and addressed the Medicare Annual Wellness questionnaire and have noted the following in the patient's chart:  A. Medical and social history B. Use of alcohol, tobacco or illicit drugs  C. Current medications and supplements D. Functional ability and status E.  Nutritional status F.  Physical activity G. Advance directives H. List of other physicians I.  Hospitalizations, surgeries, and ER visits in previous 12 months J.  [place] to include hearing, vision, cognitive, depression L. Referrals and appointments - none  In addition, I have reviewed and discussed with patient certain preventive protocols, quality metrics, and best practice  recommendations. A written personalized care plan for preventive services as well as general preventive health recommendations were provided to patient.  See attached scanned questionnaire for additional information.   Signed,   Lindell Noe, MHA, BS, LPN Health Coach

## 2016-02-12 NOTE — Progress Notes (Signed)
Pre visit review using our clinic review tool, if applicable. No additional management support is needed unless otherwise documented below in the visit note. 

## 2016-02-12 NOTE — Patient Instructions (Signed)
Scott [name] , Thank you for taking time to come for your Medicare Wellness Visit. I appreciate your ongoing commitment to your health goals. Please review the following plan we discussed and let me know if I can assist you in the future.   These are the goals we discussed: Goals    . Increase physical activity          Starting 02/12/2016, I will continue to exercise at least 30 min 3-5 days per week.        This is a list of the screening recommended for you and due dates:  Health Maintenance  Topic Date Due  . DTaP/Tdap/Td vaccine (1 - Tdap) 02/11/2026*  . Tetanus Vaccine  02/11/2026*  . Hemoglobin A1C  04/11/2016  . Eye exam for diabetics  05/10/2016  . Complete foot exam   06/08/2016  . Colon Cancer Screening  06/08/2019  . Flu Shot  Completed  . Shingles Vaccine  Completed  . Pneumonia vaccines  Completed  *Topic was postponed. The date shown is not the original due date.   Preventive Care for Adults  A healthy lifestyle and preventive care can promote health and wellness. Preventive health guidelines for adults include the following key practices.  . A routine yearly physical is a good way to check with your health care provider about your health and preventive screening. It is a chance to share any concerns and updates on your health and to receive a thorough exam.  . Visit your dentist for a routine exam and preventive care every 6 months. Brush your teeth twice a day and floss once a day. Good oral hygiene prevents tooth decay and gum disease.  . The frequency of eye exams is based on your age, health, family medical history, use  of contact lenses, and other factors. Follow your health care provider's ecommendations for frequency of eye exams.  . Eat a healthy diet. Foods like vegetables, fruits, whole grains, low-fat dairy products, and lean protein foods contain the nutrients you need without too many calories. Decrease your intake of foods high in solid fats, added  sugars, and salt. Eat the right amount of calories for you. Get information about a proper diet from your health care provider, if necessary.  . Regular physical exercise is one of the most important things you can do for your health. Most adults should get at least 150 minutes of moderate-intensity exercise (any activity that increases your heart rate and causes you to sweat) each week. In addition, most adults need muscle-strengthening exercises on 2 or more days a week.  Silver Sneakers may be a benefit available to you. To determine eligibility, you may visit the website: www.silversneakers.com or contact program at 470-807-6564 Mon-Fri between 8AM-8PM.   . Maintain a healthy weight. The body mass index (BMI) is a screening tool to identify possible weight problems. It provides an estimate of body fat based on height and weight. Your health care provider can find your BMI and can help you achieve or maintain a healthy weight.   For adults 20 years and older: ? A BMI below 18.5 is considered underweight. ? A BMI of 18.5 to 24.9 is normal. ? A BMI of 25 to 29.9 is considered overweight. ? A BMI of 30 and above is considered obese.   . Maintain normal blood lipids and cholesterol levels by exercising and minimizing your intake of saturated fat. Eat a balanced diet with plenty of fruit and vegetables. Blood tests for lipids  and cholesterol should begin at age 38 and be repeated every 5 years. If your lipid or cholesterol levels are high, you are over 50, or you are at high risk for heart disease, you may need your cholesterol levels checked more frequently. Ongoing high lipid and cholesterol levels should be treated with medicines if diet and exercise are not working.  . If you smoke, find out from your health care provider how to quit. If you do not use tobacco, please do not start.  . If you choose to drink alcohol, please do not consume more than 2 drinks per day. One drink is considered to  be 12 ounces (355 mL) of beer, 5 ounces (148 mL) of wine, or 1.5 ounces (44 mL) of liquor.  . If you are 45-61 years old, ask your health care provider if you should take aspirin to prevent strokes.  . Use sunscreen. Apply sunscreen liberally and repeatedly throughout the day. You should seek shade when your shadow is shorter than you. Protect yourself by wearing long sleeves, pants, a wide-brimmed hat, and sunglasses year round, whenever you are outdoors.  . Once a month, do a whole body skin exam, using a mirror to look at the skin on your back. Tell your health care provider of new moles, moles that have irregular borders, moles that are larger than a pencil eraser, or moles that have changed in shape or color.

## 2016-02-12 NOTE — Progress Notes (Signed)
PCP notes:   Health maintenance:  Tetanus - postponed/insurance  Abnormal screenings:   Hearing - failed  Patient concerns:   None  Nurse concerns:  No labs were completed during this encounter due to patient's extensive history of labs ordered in 2017. Patient was advised PCP would assess him and order any necessary labs at next appt.  Next PCP appt:   02/16/16 @ 0930

## 2016-02-16 ENCOUNTER — Encounter: Payer: Self-pay | Admitting: Family Medicine

## 2016-02-16 ENCOUNTER — Ambulatory Visit (INDEPENDENT_AMBULATORY_CARE_PROVIDER_SITE_OTHER): Payer: Medicare Other | Admitting: Family Medicine

## 2016-02-16 VITALS — BP 138/60 | HR 72 | Temp 97.4°F | Wt 168.5 lb

## 2016-02-16 DIAGNOSIS — E1165 Type 2 diabetes mellitus with hyperglycemia: Secondary | ICD-10-CM | POA: Diagnosis not present

## 2016-02-16 DIAGNOSIS — F5104 Psychophysiologic insomnia: Secondary | ICD-10-CM

## 2016-02-16 DIAGNOSIS — Z7189 Other specified counseling: Secondary | ICD-10-CM

## 2016-02-16 DIAGNOSIS — I2510 Atherosclerotic heart disease of native coronary artery without angina pectoris: Secondary | ICD-10-CM | POA: Diagnosis not present

## 2016-02-16 DIAGNOSIS — I10 Essential (primary) hypertension: Secondary | ICD-10-CM | POA: Diagnosis not present

## 2016-02-16 DIAGNOSIS — IMO0002 Reserved for concepts with insufficient information to code with codable children: Secondary | ICD-10-CM

## 2016-02-16 DIAGNOSIS — F4321 Adjustment disorder with depressed mood: Secondary | ICD-10-CM

## 2016-02-16 DIAGNOSIS — C9110 Chronic lymphocytic leukemia of B-cell type not having achieved remission: Secondary | ICD-10-CM

## 2016-02-16 DIAGNOSIS — E1121 Type 2 diabetes mellitus with diabetic nephropathy: Secondary | ICD-10-CM | POA: Diagnosis not present

## 2016-02-16 DIAGNOSIS — D696 Thrombocytopenia, unspecified: Secondary | ICD-10-CM

## 2016-02-16 DIAGNOSIS — F4320 Adjustment disorder, unspecified: Secondary | ICD-10-CM

## 2016-02-16 LAB — HEMOGLOBIN A1C: Hgb A1c MFr Bld: 7.8 % — ABNORMAL HIGH (ref 4.6–6.5)

## 2016-02-16 MED ORDER — TRAZODONE HCL 50 MG PO TABS: 25.0000 mg | ORAL_TABLET | Freq: Every evening | ORAL | 3 refills | Status: DC | PRN

## 2016-02-16 NOTE — Assessment & Plan Note (Signed)
Will trial trazodone in place of ambien. Pt agrees.

## 2016-02-16 NOTE — Assessment & Plan Note (Signed)
Chronic, sugar log reviewed. Check A1c today - if >8%, will discuss med changes.

## 2016-02-16 NOTE — Assessment & Plan Note (Signed)
Chronic, improved with recent med changes by cardiology

## 2016-02-16 NOTE — Patient Instructions (Addendum)
A1c today. Trial trazodone for sleep and mood - start at 1/2 pill at bedtime. Let me know how this helps. If helpful, hopefully we will be able to stop ambien.  Return in 6 months for follow up

## 2016-02-16 NOTE — Assessment & Plan Note (Signed)
Likely CLL related. Anticipate improvement with lower aspirin dose.

## 2016-02-16 NOTE — Progress Notes (Addendum)
BP 138/60   Pulse 72   Temp 97.4 F (36.3 C) (Oral)   Wt 168 lb 8 oz (76.4 kg)   BMI 24.18 kg/m    CC: AMW f/u visit Subjective:    Patient ID: Scott [name], male    DOB: Jul 13, 1940, 74 y.o.   MRN: [id number]  HPI: Scott [name] is a 75 y.o. male presenting on 02/16/2016 for Annual Exam   Saw Katha Cabal on Monday, note reviewed.   Recent dx CLL, stage 0 - established with Dr [name]. Overall asymptomatic. Planned f/u in 3 months then Q6 months.   Ongoing mourning over loss of wife 08/2015. Does better during the day when he's more active. Worse trouble at night time. Trouble with healthy meal choices.   Insomnia - ongoing ambien 5mg  use at bedtome, not very effective. Agrees to trial trazodone.   Brings extensive log of sugars and blood pressures which were reviewed.  Eye exam 05/2015.  Foot exam 06/2015 - rpt today.   Preventative: COLONOSCOPY Date: 06/2014 hyperplastic polyp, rpt 5 yrs Oletta Lamas) Prostate screening - normal DRE and PSA 2015 - decided to stop screening. Flu shot yearly Pneumovax 2013. prevnar 07/2013 zostavax 2015 Advanced directives - previously wanted full code but with wife's death experience, thinks may change this. Packet provided on Monday. Unsure HCPOA, but likely 2 sons.  Seat belt use discussed.  Sunscreen use discussed.  Ex smoker Alcohol use - none  Lives with wife, 1 dog Occupation: retired, was route Hotel manager Edu: 11th gr Activity: walks dog 3-4x/day, yardwork, rides bicycle. Diet: drinks diet coke, no vegetables  Relevant past medical, surgical, family and social history reviewed and updated as indicated. Interim medical history since our last visit reviewed. Allergies and medications reviewed and updated. Current Outpatient Prescriptions on File Prior to Visit  Medication Sig  . acetaminophen (TYLENOL) 500 MG tablet Take 500-1,000 mg by mouth daily.   Marland Kitchen amLODipine (NORVASC) 5 MG tablet Take 1 tablet (5 mg total) by mouth daily.  Marland Kitchen  aspirin EC 81 MG tablet Take 1 tablet (81 mg total) by mouth daily.  Marland Kitchen atorvastatin (LIPITOR) 80 MG tablet Take 80 mg by mouth at bedtime.  . carvedilol (COREG) 6.25 MG tablet Take 1 tablet (6.25 mg total) by mouth 2 (two) times daily.  . ferrous sulfate 325 (65 FE) MG tablet Take 325 mg by mouth daily with breakfast.  . furosemide (LASIX) 40 MG tablet Take 40 mg by mouth daily as needed for fluid (ankle swelling).   Marland Kitchen glucose blood (ONE TOUCH ULTRA TEST) test strip Use to check sugar twice daily and as needed Dx:E11.65  . insulin aspart (NOVOLOG FLEXPEN) 100 UNIT/ML FlexPen Inject 5 Units into the skin 3 (three) times daily with meals.  . Insulin Detemir (LEVEMIR FLEXTOUCH) 100 UNIT/ML Pen Inject 30 Units into the skin daily at 10 pm.  . levothyroxine (SYNTHROID, LEVOTHROID) 50 MCG tablet TAKE ONE TABLET BY MOUTH ONCE DAILY  . lisinopril (PRINIVIL,ZESTRIL) 20 MG tablet Take 1 tablet (20 mg total) by mouth daily.  . niacin (SLO-NIACIN) 500 MG tablet Take 1,000 mg by mouth at bedtime.   . nitroGLYCERIN (NITROSTAT) 0.4 MG SL tablet Place 1 tablet (0.4 mg total) under the tongue every 5 (five) minutes as needed for chest pain ((MAX of 3 doses)).  Marland Kitchen zolpidem (AMBIEN) 10 MG tablet Take 0.5 tablets (5 mg total) by mouth at bedtime.   No current facility-administered medications on file prior to visit.     Review  of Systems Per HPI unless specifically indicated in ROS section     Objective:    BP 138/60   Pulse 72   Temp 97.4 F (36.3 C) (Oral)   Wt 168 lb 8 oz (76.4 kg)   BMI 24.18 kg/m   Wt Readings from Last 3 Encounters:  02/16/16 168 lb 8 oz (76.4 kg)  02/12/16 161 lb 8 oz (73.3 kg)  02/01/16 165 lb 9.6 oz (75.1 kg)    Physical Exam  Constitutional: He appears well-developed and well-nourished. No distress.  HENT:  Head: Normocephalic and atraumatic.  Right Ear: External ear normal.  Left Ear: External ear normal.  Nose: Nose normal.  Mouth/Throat: Oropharynx is clear and  moist. No oropharyngeal exudate.  Eyes: Conjunctivae and EOM are normal. Pupils are equal, round, and reactive to light. No scleral icterus.  Neck: Normal range of motion. Neck supple.  Cardiovascular: Normal rate, regular rhythm, normal heart sounds and intact distal pulses.   No murmur heard. Pulmonary/Chest: Effort normal and breath sounds normal. No respiratory distress. He has no wheezes. He has no rales.  Musculoskeletal: He exhibits edema (1+ bilat).  See HPI for foot exam if done  Lymphadenopathy:    He has no cervical adenopathy.  Skin: Skin is warm and dry. No rash noted.  Psychiatric: He has a normal mood and affect.  Nursing note and vitals reviewed.  Results for orders placed or performed in visit on 01/10/16  CLL Lymphoma Diagnostic Panel  Result Value Ref Range   Clinical Information NOT PROVIDED    Specimen Type NOT PROVIDED    Viability 92 %   Interpretation REPORT    Sample Description REPORT    Gating Strategy REPORT    Number of Markers 18    Markers REPORT    Specimen Type     Clinical Indication     Lab Results  Component Value Date   TSH 2.03 12/13/2015       Assessment & Plan:   Problem List Items Addressed This Visit    Advanced care planning/counseling discussion    Advanced directives - previously wanted full code but with wife's death experience, thinks may change this. Packet provided on Monday. Unsure HCPOA, but likely 2 sons.       Chronic insomnia    Will trial trazodone in place of ambien. Pt agrees.       CLL (chronic lymphocytic leukemia) (HCC)    Stage 0. Appreciate onc care.       Grieving    Ongoing. Support provided. Trial trazodone for sleep.       Hypertension    Chronic, improved with recent med changes by cardiology       Thrombocytopenia ([place])    Likely CLL related. Anticipate improvement with lower aspirin dose.       Uncontrolled type 2 diabetes mellitus with nephropathy (HCC) - Primary    Chronic, sugar log  reviewed. Check A1c today - if >8%, will discuss med changes.       Relevant Orders   Hemoglobin A1c       Follow up plan: Return in about 6 months (around 08/16/2016) for follow up visit.  [name], MD

## 2016-02-16 NOTE — Assessment & Plan Note (Signed)
Stage 0. Appreciate onc care.

## 2016-02-16 NOTE — Assessment & Plan Note (Signed)
Advanced directives - previously wanted full code but with wife's death experience, thinks may change this. Packet provided on Monday. Unsure HCPOA, but likely 2 sons.

## 2016-02-16 NOTE — Assessment & Plan Note (Addendum)
Ongoing. Support provided. Trial trazodone for sleep.

## 2016-02-16 NOTE — Progress Notes (Signed)
Pre visit review using our clinic review tool, if applicable. No additional management support is needed unless otherwise documented below in the visit note. 

## 2016-02-18 NOTE — Progress Notes (Signed)
I reviewed health advisor's note, was available for consultation on the day of service listed in this note, and agree with documentation and plan. Aysia Lowder, MD.   

## 2016-02-21 ENCOUNTER — Telehealth: Payer: Self-pay | Admitting: Family Medicine

## 2016-02-21 DIAGNOSIS — E119 Type 2 diabetes mellitus without complications: Secondary | ICD-10-CM | POA: Diagnosis not present

## 2016-02-21 DIAGNOSIS — H401132 Primary open-angle glaucoma, bilateral, moderate stage: Secondary | ICD-10-CM | POA: Diagnosis not present

## 2016-02-21 DIAGNOSIS — Z961 Presence of intraocular lens: Secondary | ICD-10-CM | POA: Diagnosis not present

## 2016-02-21 DIAGNOSIS — E02 Subclinical iodine-deficiency hypothyroidism: Secondary | ICD-10-CM

## 2016-02-21 LAB — HM DIABETES EYE EXAM

## 2016-02-23 NOTE — Addendum Note (Signed)
Addended by: [name] on: 02/23/2016 01:37 PM   Modules accepted: Orders

## 2016-02-23 NOTE — Telephone Encounter (Signed)
[facility] has changed its generic manufacturer and requires providers approval to change the pt to the new manufacturer.

## 2016-02-23 NOTE — Telephone Encounter (Addendum)
Ok to do - plz notify patient of change and schedule f/u TFTs 6 wks after starts new med. ordered

## 2016-02-28 NOTE — Telephone Encounter (Signed)
Pharmacy and patient notified. Lab appt scheduled. 

## 2016-03-27 ENCOUNTER — Other Ambulatory Visit: Payer: Self-pay | Admitting: Family Medicine

## 2016-03-27 DIAGNOSIS — I129 Hypertensive chronic kidney disease with stage 1 through stage 4 chronic kidney disease, or unspecified chronic kidney disease: Secondary | ICD-10-CM | POA: Diagnosis not present

## 2016-03-27 DIAGNOSIS — I151 Hypertension secondary to other renal disorders: Secondary | ICD-10-CM | POA: Diagnosis not present

## 2016-03-27 DIAGNOSIS — E1129 Type 2 diabetes mellitus with other diabetic kidney complication: Secondary | ICD-10-CM | POA: Diagnosis not present

## 2016-03-27 DIAGNOSIS — N183 Chronic kidney disease, stage 3 (moderate): Secondary | ICD-10-CM | POA: Diagnosis not present

## 2016-03-27 DIAGNOSIS — R809 Proteinuria, unspecified: Secondary | ICD-10-CM | POA: Diagnosis not present

## 2016-04-10 ENCOUNTER — Other Ambulatory Visit: Payer: Self-pay | Admitting: Family Medicine

## 2016-04-11 ENCOUNTER — Other Ambulatory Visit (INDEPENDENT_AMBULATORY_CARE_PROVIDER_SITE_OTHER): Payer: Medicare Other

## 2016-04-11 DIAGNOSIS — E02 Subclinical iodine-deficiency hypothyroidism: Secondary | ICD-10-CM | POA: Diagnosis not present

## 2016-04-11 LAB — TSH: TSH: 1.97 u[IU]/mL (ref 0.35–4.50)

## 2016-04-11 LAB — T4, FREE: FREE T4: 0.84 ng/dL (ref 0.60–1.60)

## 2016-04-17 ENCOUNTER — Encounter: Payer: Self-pay | Admitting: *Deleted

## 2016-04-21 NOTE — Progress Notes (Signed)
[facility]  [phone] [phone]  ID: Scott [name] OB: August 12, 1940  MR#: [id number]  QHU#:[id number]  Patient Care Team: [name], MD as PCP - General (Family Medicine) Lloyd Huger, MD as Consulting Physician (Oncology) Clent Jacks, MD as Consulting Physician (Ophthalmology) Fleet Contras, MD as Consulting Physician (Nephrology) Dorothy Spark, MD as Consulting Physician (Cardiology)  CHIEF COMPLAINT: CLL.  INTERVAL HISTORY: Patient returns to clinic today for repeat laboratory work and further evaluation. He continues to feel well and remains asymptomatic. He has no neurologic complaints. He denies any recent fevers or illnesses. He has a good appetite and denies weight loss. He denies any night sweats. He has noted no lymphadenopathy. He has no chest pain or shortness of breath. He denies any nausea, vomiting, constipation, or diarrhea. He has no urinary complaints. Patient feels at his baseline and offers no specific complaints today.  REVIEW OF SYSTEMS:   Review of Systems  Constitutional: Negative.  Negative for fever, malaise/fatigue and weight loss.  Respiratory: Negative.  Negative for cough and shortness of breath.   Cardiovascular: Negative.  Negative for chest pain and leg swelling.  Gastrointestinal: Negative.  Negative for abdominal pain.  Genitourinary: Negative.   Musculoskeletal: Negative.   Neurological: Negative.  Negative for weakness.  Psychiatric/Behavioral: The patient is nervous/anxious.     As per HPI. Otherwise, a complete review of systems is negative.  PAST MEDICAL HISTORY: Past Medical History:  Diagnosis Date  . Acquired hand deformity 1962   hand saw accident at work  . Anemia 10/11/2011  . Arthritis   . Bradycardia 10/10/2011  . CAD (coronary artery disease) 10/10/2011   MI s/p PTCA (Dx-OM2 proximal concentric stenosis)  . CKD (chronic kidney disease) stage 3, GFR 30-59 ml/min    Scott [name]  . CLL (chronic lymphocytic leukemia) ([place])   . Colon polyps   . Generalized headaches    frequent  . Glaucoma    s/p laser surgery  . HLD (hyperlipidemia)   . Hypertension   . Hypothyroidism 10/10/2011  . Thrombocytopenia ([place]) 10/11/2011  . Type 2 diabetes with nephropathy [facility])    DM refresher course [facility] (04/2013)    PAST SURGICAL HISTORY: Past Surgical History:  Procedure Laterality Date  . BACK SURGERY     cervical neck  . CATARACT EXTRACTION, BILATERAL Bilateral 2017  . COLONOSCOPY  11/2008   1 polyp, diverticulosis, rec rpt 5 yrs (Dr. Oletta Lamas, [name])  . COLONOSCOPY  06/2014   hyperplastic polyp, rpt 5 yrs ([place])  . EYE SURGERY  2012   laser surgery for glaucoma  . PTCA  1994, 1995  . US ECHOCARDIOGRAPHY  10/2013   inferior wall hypokinesis, mild LVH, EF 50-55%, mild MR and LA dilation    FAMILY HISTORY: Family History  Problem Relation Age of Onset  . Diabetes Sister   . Stomach cancer Sister 36  . Pneumonia Father     caused death  . Other Brother     no communication with brother so unsure of any health conditions  . Coronary artery disease Son 69    5v CABG and stents  . Hyperlipidemia Sister   . Stroke Neg Hx   . Heart attack Neg Hx     ADVANCED DIRECTIVES (Y/N):  N  HEALTH MAINTENANCE: Social History  Substance Use Topics  . Smoking status: Former Smoker    Quit date: 03/04/1978  . Smokeless tobacco: Never Used  . Alcohol use No     Colonoscopy:  PAP:  Bone density:  Lipid panel:  No Known Allergies  Current Outpatient Prescriptions  Medication Sig Dispense Refill  . acetaminophen (TYLENOL) 500 MG tablet Take 650 mg by mouth 2 (two) times daily. 2 pills two times a day    . amLODipine (NORVASC) 5 MG tablet Take 1 tablet (5 mg total) by mouth daily. 90 tablet 3  . aspirin EC 81 MG tablet Take 1 tablet (81 mg total) by mouth daily. 90 tablet 3  . atorvastatin (LIPITOR) 80 MG tablet TAKE ONE TABLET BY MOUTH ONCE DAILY 90 tablet 3  .  carvedilol (COREG) 6.25 MG tablet Take 1 tablet (6.25 mg total) by mouth 2 (two) times daily. 180 tablet 3  . ferrous sulfate 325 (65 FE) MG tablet Take 325 mg by mouth daily with breakfast.    . furosemide (LASIX) 40 MG tablet Take 40 mg by mouth daily as needed for fluid (ankle swelling).     Marland Kitchen glucose blood (ONE TOUCH ULTRA TEST) test strip Use to check sugar twice daily and as needed Dx:E11.65 300 each 3  . insulin aspart (NOVOLOG FLEXPEN) 100 UNIT/ML FlexPen Inject 5 Units into the skin 3 (three) times daily with meals. 15 mL 3  . LEVEMIR FLEXTOUCH 100 UNIT/ML Pen INJECT 30 UNITS SUBCUTANEOUSLY DAILY AT 10 PM 15 pen 3  . levothyroxine (SYNTHROID, LEVOTHROID) 50 MCG tablet TAKE ONE TABLET BY MOUTH ONCE DAILY 90 tablet 3  . lisinopril (PRINIVIL,ZESTRIL) 20 MG tablet Take 1 tablet (20 mg total) by mouth daily. 90 tablet 2  . niacin (SLO-NIACIN) 500 MG tablet Take 1,000 mg by mouth at bedtime.     . nitroGLYCERIN (NITROSTAT) 0.4 MG SL tablet Place 1 tablet (0.4 mg total) under the tongue every 5 (five) minutes as needed for chest pain ((MAX of 3 doses)). 25 tablet 0  . traZODone (DESYREL) 50 MG tablet Take 0.5-1 tablets (25-50 mg total) by mouth at bedtime as needed for sleep. (Patient taking differently: Take 25 mg by mouth at bedtime. ) 30 tablet 3   No current facility-administered medications for this visit.     OBJECTIVE: Vitals:   04/22/16 1359  BP: (!) 146/76  Pulse: (!) 57  Resp: 18  Temp: 97.6 F (36.4 C)     Body mass index is 23.79 kg/m.    ECOG FS:0 - Asymptomatic  General: Well-developed, well-nourished, no acute distress. Eyes: Pink conjunctiva, anicteric sclera. Lungs: Clear to auscultation bilaterally. Heart: Regular rate and rhythm. No rubs, murmurs, or gallops. Abdomen: Soft, nontender, nondistended. No organomegaly noted, normoactive bowel sounds. Musculoskeletal: No edema, cyanosis, or clubbing. Neuro: Alert, answering all questions appropriately. Cranial nerves  grossly intact. Skin: No rashes or petechiae noted. Psych: Normal affect.  LAB RESULTS:  Lab Results  Component Value Date   NA 143 12/13/2015   K 4.5 12/13/2015   CL 112 (H) 12/13/2015   CO2 24 12/13/2015   GLUCOSE 137 (H) 12/13/2015   BUN 33 (H) 12/13/2015   CREATININE 1.30 (H) 12/13/2015   CALCIUM 9.1 12/13/2015   PROT 5.8 (L) 12/13/2015   ALBUMIN 3.8 12/13/2015   AST 18 12/13/2015   ALT 12 12/13/2015   ALKPHOS 48 12/13/2015   BILITOT 0.5 12/13/2015   GFRNONAA 57 (L) 11/13/2015   GFRAA >60 11/13/2015    Lab Results  Component Value Date   WBC 14.2 (H) 04/22/2016   NEUTROABS 3.6 04/22/2016   HGB 11.7 (L) 04/22/2016   HCT 34.8 (L) 04/22/2016   MCV 94.3 04/22/2016  PLT 106 (L) 04/22/2016     STUDIES: No results found.  ASSESSMENT: CLL, Rai stage 0.  PLAN:    1. CLL: Confirmed by peripheral blood flow cytometry. No intervention is needed at this time. Can consider imaging with CT scan in the future to assess for lymphadenopathy, but this is not necessary at this time. Patient does not require bone marrow biopsy. Return to clinic in 3 months with repeat laboratory work only and then in 6 months for laboratory work and further evaluation. 2. Thrombocytopenia: Likely secondary to CLL. Patient's final count is greater than 100 therefore he continues to be stage 0. Continue to monitor.  Approximately 20 minutes was spent in discussion of which greater than 50% was consultation.  Patient expressed understanding and was in agreement with this plan. He also understands that He can call clinic at any time with any questions, concerns, or complaints.    Lloyd Huger, MD   04/22/2016 2:07 PM

## 2016-04-22 ENCOUNTER — Encounter: Payer: Self-pay | Admitting: Oncology

## 2016-04-22 ENCOUNTER — Inpatient Hospital Stay: Payer: Medicare Other | Attending: Oncology | Admitting: Oncology

## 2016-04-22 ENCOUNTER — Inpatient Hospital Stay: Payer: Medicare Other

## 2016-04-22 VITALS — BP 146/76 | HR 57 | Temp 97.6°F | Resp 18 | Ht 70.0 in | Wt 165.8 lb

## 2016-04-22 DIAGNOSIS — E1122 Type 2 diabetes mellitus with diabetic chronic kidney disease: Secondary | ICD-10-CM | POA: Insufficient documentation

## 2016-04-22 DIAGNOSIS — E039 Hypothyroidism, unspecified: Secondary | ICD-10-CM | POA: Insufficient documentation

## 2016-04-22 DIAGNOSIS — H409 Unspecified glaucoma: Secondary | ICD-10-CM | POA: Insufficient documentation

## 2016-04-22 DIAGNOSIS — E785 Hyperlipidemia, unspecified: Secondary | ICD-10-CM | POA: Diagnosis not present

## 2016-04-22 DIAGNOSIS — C9110 Chronic lymphocytic leukemia of B-cell type not having achieved remission: Secondary | ICD-10-CM | POA: Insufficient documentation

## 2016-04-22 DIAGNOSIS — I129 Hypertensive chronic kidney disease with stage 1 through stage 4 chronic kidney disease, or unspecified chronic kidney disease: Secondary | ICD-10-CM | POA: Insufficient documentation

## 2016-04-22 DIAGNOSIS — N183 Chronic kidney disease, stage 3 (moderate): Secondary | ICD-10-CM | POA: Insufficient documentation

## 2016-04-22 DIAGNOSIS — Z7982 Long term (current) use of aspirin: Secondary | ICD-10-CM | POA: Insufficient documentation

## 2016-04-22 DIAGNOSIS — I2510 Atherosclerotic heart disease of native coronary artery without angina pectoris: Secondary | ICD-10-CM | POA: Diagnosis not present

## 2016-04-22 DIAGNOSIS — D696 Thrombocytopenia, unspecified: Secondary | ICD-10-CM | POA: Insufficient documentation

## 2016-04-22 DIAGNOSIS — Z86010 Personal history of colonic polyps: Secondary | ICD-10-CM | POA: Insufficient documentation

## 2016-04-22 DIAGNOSIS — Z87891 Personal history of nicotine dependence: Secondary | ICD-10-CM | POA: Diagnosis not present

## 2016-04-22 DIAGNOSIS — Z79899 Other long term (current) drug therapy: Secondary | ICD-10-CM | POA: Diagnosis not present

## 2016-04-22 LAB — CBC WITH DIFFERENTIAL/PLATELET
BASOS ABS: 0.1 10*3/uL (ref 0–0.1)
Basophils Relative: 1 %
Eosinophils Absolute: 0.1 10*3/uL (ref 0–0.7)
Eosinophils Relative: 1 %
HEMATOCRIT: 34.8 % — AB (ref 40.0–52.0)
Hemoglobin: 11.7 g/dL — ABNORMAL LOW (ref 13.0–18.0)
LYMPHS ABS: 9.9 10*3/uL — AB (ref 1.0–3.6)
LYMPHS PCT: 69 %
MCH: 31.8 pg (ref 26.0–34.0)
MCHC: 33.7 g/dL (ref 32.0–36.0)
MCV: 94.3 fL (ref 80.0–100.0)
MONO ABS: 0.5 10*3/uL (ref 0.2–1.0)
MONOS PCT: 3 %
NEUTROS ABS: 3.6 10*3/uL (ref 1.4–6.5)
Neutrophils Relative %: 26 %
Platelets: 106 10*3/uL — ABNORMAL LOW (ref 150–440)
RBC: 3.69 MIL/uL — ABNORMAL LOW (ref 4.40–5.90)
RDW: 13.7 % (ref 11.5–14.5)
WBC: 14.2 10*3/uL — ABNORMAL HIGH (ref 3.8–10.6)

## 2016-04-25 ENCOUNTER — Encounter: Payer: Self-pay | Admitting: Cardiology

## 2016-04-28 LAB — COMP PANEL: LEUKEMIA/LYMPHOMA
Immunophenotypic Profile: 48
PATH INTERP XXX-IMP: POSITIVE

## 2016-05-06 ENCOUNTER — Ambulatory Visit (INDEPENDENT_AMBULATORY_CARE_PROVIDER_SITE_OTHER): Payer: Medicare Other | Admitting: Cardiology

## 2016-05-06 ENCOUNTER — Encounter: Payer: Self-pay | Admitting: Cardiology

## 2016-05-06 VITALS — BP 124/64 | HR 60 | Ht 70.0 in | Wt 165.0 lb

## 2016-05-06 DIAGNOSIS — E785 Hyperlipidemia, unspecified: Secondary | ICD-10-CM

## 2016-05-06 DIAGNOSIS — R079 Chest pain, unspecified: Secondary | ICD-10-CM

## 2016-05-06 DIAGNOSIS — I2510 Atherosclerotic heart disease of native coronary artery without angina pectoris: Secondary | ICD-10-CM | POA: Diagnosis not present

## 2016-05-06 DIAGNOSIS — E7849 Other hyperlipidemia: Secondary | ICD-10-CM

## 2016-05-06 DIAGNOSIS — R002 Palpitations: Secondary | ICD-10-CM | POA: Diagnosis not present

## 2016-05-06 DIAGNOSIS — I10 Essential (primary) hypertension: Secondary | ICD-10-CM | POA: Diagnosis not present

## 2016-05-06 DIAGNOSIS — E784 Other hyperlipidemia: Secondary | ICD-10-CM | POA: Diagnosis not present

## 2016-05-06 MED ORDER — AMLODIPINE BESYLATE 5 MG PO TABS: 5.0000 mg | ORAL_TABLET | Freq: Every day | ORAL | 3 refills | Status: DC

## 2016-05-06 MED ORDER — FUROSEMIDE 40 MG PO TABS: 40.0000 mg | ORAL_TABLET | Freq: Every day | ORAL | 3 refills | Status: DC | PRN

## 2016-05-06 MED ORDER — CARVEDILOL 6.25 MG PO TABS: 6.2500 mg | ORAL_TABLET | Freq: Two times a day (BID) | ORAL | 3 refills | Status: DC

## 2016-05-06 MED ORDER — ATORVASTATIN CALCIUM 80 MG PO TABS: 80.0000 mg | ORAL_TABLET | Freq: Every day | ORAL | 3 refills | Status: DC

## 2016-05-06 MED ORDER — NITROGLYCERIN 0.4 MG SL SUBL: 0.4000 mg | SUBLINGUAL_TABLET | SUBLINGUAL | 0 refills | Status: DC | PRN

## 2016-05-06 MED ORDER — LISINOPRIL 20 MG PO TABS: 20.0000 mg | ORAL_TABLET | Freq: Every day | ORAL | 2 refills | Status: DC

## 2016-05-06 NOTE — Progress Notes (Signed)
Cardiology Office Note    Date:  05/06/2016   ID:  Scott [name], DOB 1940/04/01, MRN [id number]  PCP:  [name], MD  Cardiologist:  Dr. Meda Coffee  CC: chest pain   History of Present Illness:  Scott [name] is a 76 y.o. male with a history of CAD s/p MI s/p PTCA (Dx-OM2 proximal concentric stenosis), DM with peripheral nephropathy, CKD, bradycardia and HTN who presents to clinic for evaluation of chest pain.   Scott [name] is a former patient of Dr Verl Blalock who underwent 2 cardiac caths in 1990 for chest pain and it is unclear if he underwent any PCI or stenting. He was doing well until 2013 when he was admitted for chest pain, ruled out for ACS and an exercise stress test was negative for ischemia or prior scar. His last echocardiogram showed EF of 55-60% with mild biatrial enlargement. There was no significant valvular heart disease. He was also no pericardial effusion.   He was recently seen in the ER on 10/21/15 for evaluation of chest pain. His wife had recently died and since that time he has had occasional pain over the left lateral breast that is burning in nature. He ruled out for MI and was seen by Dr. Rayann Heman in consult. He was chest pain free and so he was discharged home with plans for outpatient follow up with [facility], which has not been completed yet.   Wife passed 08/06/15 of bone cancer. He has been having a lot of stress due to this. For about the last 2 weeks or so he has been having intermittent palpitations that last for minutes. He hasn't had any in a couple weeks. When he takes his BP during these episodes his SBP will go up ~ 190. Sometimes gets chest tightness that is not a pain but "something he is just aware of" and then he sits and worries about it. He used to be able to go out and do hours of yard work with no problems but now he is feeling like won't do that anymore because he just wants to sit inside and think about his wife. He gets intermittent chest pain that is not  related to exertion. No associated SOB or DOE. He took a SL NTG and it doesn't seem to help. No LE edema, orthopnea or PND. No dizziness or SOB. Keeps a log of his BP and BS which have all been under good control.   05/06/2016, this is a 3 months follow-up the patient feels significantly better, he is engaged with housework and yardwork. He doesn't feel so depressed anymore. He denies any chest pain, shortness of breath, dizziness, palpitations. He doesn't like cooking so he basically prepares can food that contains a lot of salt and it looks occasional lower extremity edema for which he takes Lasix as needed. He has lost 4 pounds in the last year.  Past Medical History:  Diagnosis Date  . Acquired hand deformity 1962   hand saw accident at work  . Anemia 10/11/2011  . Arthritis   . Bradycardia 10/10/2011  . CAD (coronary artery disease) 10/10/2011   MI s/p PTCA (Dx-OM2 proximal concentric stenosis)  . CKD (chronic kidney disease) stage 3, GFR 30-59 ml/min    Scott [name]  . CLL (chronic lymphocytic leukemia) ([place])   . Colon polyps   . Generalized headaches    frequent  . Glaucoma    s/p laser surgery  . HLD (hyperlipidemia)   . Hypertension   .  Hypothyroidism 10/10/2011  . Thrombocytopenia ([place]) 10/11/2011  . Type 2 diabetes with nephropathy [facility])    DM refresher course [facility] (04/2013)    Past Surgical History:  Procedure Laterality Date  . BACK SURGERY     cervical neck  . CATARACT EXTRACTION, BILATERAL Bilateral 2017  . COLONOSCOPY  11/2008   1 polyp, diverticulosis, rec rpt 5 yrs (Dr. Oletta Lamas, [name])  . COLONOSCOPY  06/2014   hyperplastic polyp, rpt 5 yrs ([facility])  . EYE SURGERY  2012   laser surgery for glaucoma  . PTCA  1994, 1995  . US ECHOCARDIOGRAPHY  10/2013   inferior wall hypokinesis, mild LVH, EF 50-55%, mild MR and LA dilation    Current Medications: Outpatient Medications Prior to Visit  Medication Sig Dispense Refill  . acetaminophen (TYLENOL) 500 MG tablet Take 650 mg  by mouth 2 (two) times daily. 2 pills two times a day    . aspirin EC 81 MG tablet Take 1 tablet (81 mg total) by mouth daily. 90 tablet 3  . ferrous sulfate 325 (65 FE) MG tablet Take 325 mg by mouth daily with breakfast.    . glucose blood (ONE TOUCH ULTRA TEST) test strip Use to check sugar twice daily and as needed Dx:E11.65 300 each 3  . insulin aspart (NOVOLOG FLEXPEN) 100 UNIT/ML FlexPen Inject 5 Units into the skin 3 (three) times daily with meals. 15 mL 3  . LEVEMIR FLEXTOUCH 100 UNIT/ML Pen INJECT 30 UNITS SUBCUTANEOUSLY DAILY AT 10 PM 15 pen 3  . levothyroxine (SYNTHROID, LEVOTHROID) 50 MCG tablet TAKE ONE TABLET BY MOUTH ONCE DAILY 90 tablet 3  . niacin (SLO-NIACIN) 500 MG tablet Take 1,000 mg by mouth at bedtime.     . traZODone (DESYREL) 50 MG tablet Take 0.5-1 tablets (25-50 mg total) by mouth at bedtime as needed for sleep. (Patient taking differently: Take 25 mg by mouth at bedtime. ) 30 tablet 3  . amLODipine (NORVASC) 5 MG tablet Take 1 tablet (5 mg total) by mouth daily. 90 tablet 3  . atorvastatin (LIPITOR) 80 MG tablet TAKE ONE TABLET BY MOUTH ONCE DAILY 90 tablet 3  . carvedilol (COREG) 6.25 MG tablet Take 1 tablet (6.25 mg total) by mouth 2 (two) times daily. 180 tablet 3  . furosemide (LASIX) 40 MG tablet Take 40 mg by mouth daily as needed for fluid (ankle swelling).     Marland Kitchen lisinopril (PRINIVIL,ZESTRIL) 20 MG tablet Take 1 tablet (20 mg total) by mouth daily. 90 tablet 2  . nitroGLYCERIN (NITROSTAT) 0.4 MG SL tablet Place 1 tablet (0.4 mg total) under the tongue every 5 (five) minutes as needed for chest pain ((MAX of 3 doses)). 25 tablet 0   No facility-administered medications prior to visit.      Allergies:   Patient has no known allergies.   Social History   Social History  . Marital status: Married    Spouse name: N/A  . Number of children: N/A  . Years of education: N/A   Social History Main Topics  . Smoking status: Former Smoker    Quit date: 03/04/1978    . Smokeless tobacco: Never Used  . Alcohol use No  . Drug use: No  . Sexual activity: No   Other Topics Concern  . None   Social History Narrative   Widower. Wife passed 08/2015 from cancer. 1 dog   Occupation: retired, was route Hotel manager   Edu: 11th gr   Activity: walks dog (pomeranian) 3-4x/day, yardwork, rides  bicycle.   Diet: drinks diet coke, no vegetables     Family History:  The patient's family history includes Coronary artery disease (age of onset: 92) in his son; Diabetes in his sister; Hyperlipidemia in his sister; Other in his brother; Pneumonia in his father; Stomach cancer (age of onset: 81) in his sister.     ROS:   Please see the history of present illness.    ROS All other systems reviewed and are negative.   PHYSICAL EXAM:   VS:  BP 124/64   Pulse 60   Ht 5\' 10"  (1.778 m)   Wt 165 lb (74.8 kg)   BMI 23.68 kg/m    GEN: Well nourished, well developed, in no acute distress  HEENT: normal  Neck: no JVD, carotid bruits, or masses Cardiac: RRR; no murmurs, rubs, or gallops,no edema  Respiratory:  clear to auscultation bilaterally, normal work of breathing GI: soft, nontender, nondistended, + BS MS: no deformity or atrophy  Skin: warm and dry, no rash Neuro:  Alert and Oriented x 3, Strength and sensation are intact Psych: euthymic mood, full affect  Wt Readings from Last 3 Encounters:  05/06/16 165 lb (74.8 kg)  04/22/16 165 lb 12.6 oz (75.2 kg)  02/16/16 168 lb 8 oz (76.4 kg)      Studies/Labs Reviewed:   EKG:  EKG is NOT ordered today.    Recent Labs: 12/13/2015: ALT 12; BUN 33; Creat 1.30; Potassium 4.5; Sodium 143 04/11/2016: TSH 1.97 04/22/2016: Hemoglobin 11.7; Platelets 106   Lipid Panel    Component Value Date/Time   CHOL 124 (L) 12/13/2015 0859   CHOL 153 09/04/2011   TRIG 80 12/13/2015 0859   TRIG 78 09/04/2011   HDL 48 12/13/2015 0859   CHOLHDL 2.6 12/13/2015 0859   VLDL 16 12/13/2015 0859   LDLCALC 60 12/13/2015 0859    LDLDIRECT 84 09/04/2011    Additional studies/ records that were reviewed today include:  Echo 10/26/2013 - Left ventricle: Inferior wall hypokinesis. The cavity size was normal. Wall thickness was increased in a pattern of mild LVH. Systolic function was normal. The estimated ejection fraction was in the range of 50% to 55%. - Mitral valve: There was mild regurgitation. - Left atrium: The atrium was mildly dilated. - Atrial septum: No defect or patent foramen ovale was identified.  ECG: SR, old inferior MI, otherwise normal  Lexiscan nuclear stress test: 11/23/2013 Impression Exercise Capacity: Lexiscan with low level exercise. BP Response: Normal blood pressure response. Clinical Symptoms: Anxiety, shortness of breath ECG Impression: No significant ST segment change suggestive of ischemia. Comparison with Prior Nuclear Study: No images to compare  Overall Impression: Low risk stress nuclear study with no significant ischemia identified. Perfusion images suggestive of left ventricular hypertrophy.. LV Ejection Fraction: 58%. LV Wall Motion: NL LV Function; NL Wall Motion  Lexiscan Nuclear stress test: 11/2015  Nuclear stress EF: 58%.  There was no ST segment deviation noted during stress.  Defect 1: There is a small defect of severe severity present in the apex location.  This is a low risk study.  The left ventricular ejection fraction is normal (55-65%).   Low risk stress nuclear study with mild apical thinning and mild apical ischemia; EF 58 with normal wall motion   ASSESSMENT & PLAN:   Chest pain: Minimal ischemia in apical anterior wall, patient is now asymptomatic we'll continue medical management, His blood pressure is now controlled. He brings his diary with majority of normal blood pressure readings.  Palpitations: Most probably related to anxiety after his wife passed away they're now resolved..  CAD: prior intervention possible PCTA? ,  Continue ASA,  moderate dose rosuvastatin, lisinopril, and carvedilol. Preserved LVEF. Lipids at goal in October 2017, normal LFTs, we will repeat prior to next appointment in 6 months.  HTN: Controlled after we increased his amlodipine.  Hyperlipidemia: continue atorvastatin and niacin, no side effects.  Sinus bradycardia: asymptomatic, we will continue carvedilol 6.25mg  BID    Medication Adjustments/Labs and Tests Ordered: Current medicines are reviewed at length with the patient today.  Concerns regarding medicines are outlined above.  Medication changes, Labs and Tests ordered today are listed in the Patient Instructions below. Patient Instructions  Medication Instructions:   Your physician recommends that you continue on your current medications as directed. Please refer to the Current Medication list given to you today.     Labwork:  PRIOR TOO YOUR 6 MONTH FOLLOW-UP APPOINTMENT WITH DR Meda Coffee TO CHECK A ---CMET, CBC W DIFF, TSH, AND LIPIDS---PLEASE COME FASTING TO THIS LAB APPOINTMENT      Follow-Up:  Your physician wants you to follow-up in: [place] will receive a reminder letter in the mail two months in advance. If you don't receive a letter, please call our office to schedule the follow-up appointment.  PLEASE HAVE YOUR LABS DONE A WEEK PRIOR TO THIS APPOINTMENT        If you need a refill on your cardiac medications before your next appointment, please call your pharmacy.      Signed, Ena Dawley, MD  05/06/2016 9:52 AM    [address], [place], [place]  [address] Phone: [phone]; Fax: [phone]

## 2016-05-06 NOTE — Patient Instructions (Signed)
Medication Instructions:   Your physician recommends that you continue on your current medications as directed. Please refer to the Current Medication list given to you today.     Labwork:  PRIOR TOO YOUR 6 MONTH FOLLOW-UP APPOINTMENT WITH DR Meda Coffee TO CHECK A ---CMET, CBC W DIFF, TSH, AND LIPIDS---PLEASE COME FASTING TO THIS LAB APPOINTMENT      Follow-Up:  Your physician wants you to follow-up in: [place] will receive a reminder letter in the mail two months in advance. If you don't receive a letter, please call our office to schedule the follow-up appointment.  PLEASE HAVE YOUR LABS DONE A WEEK PRIOR TO THIS APPOINTMENT        If you need a refill on your cardiac medications before your next appointment, please call your pharmacy.

## 2016-05-07 ENCOUNTER — Other Ambulatory Visit: Payer: Self-pay | Admitting: Family Medicine

## 2016-05-24 ENCOUNTER — Encounter: Payer: Self-pay | Admitting: Family Medicine

## 2016-07-22 ENCOUNTER — Inpatient Hospital Stay: Payer: Medicare Other | Attending: Oncology

## 2016-07-22 DIAGNOSIS — Z79899 Other long term (current) drug therapy: Secondary | ICD-10-CM | POA: Insufficient documentation

## 2016-07-22 DIAGNOSIS — E785 Hyperlipidemia, unspecified: Secondary | ICD-10-CM | POA: Insufficient documentation

## 2016-07-22 DIAGNOSIS — E1122 Type 2 diabetes mellitus with diabetic chronic kidney disease: Secondary | ICD-10-CM | POA: Insufficient documentation

## 2016-07-22 DIAGNOSIS — Z86010 Personal history of colonic polyps: Secondary | ICD-10-CM | POA: Diagnosis not present

## 2016-07-22 DIAGNOSIS — I2510 Atherosclerotic heart disease of native coronary artery without angina pectoris: Secondary | ICD-10-CM | POA: Insufficient documentation

## 2016-07-22 DIAGNOSIS — I129 Hypertensive chronic kidney disease with stage 1 through stage 4 chronic kidney disease, or unspecified chronic kidney disease: Secondary | ICD-10-CM | POA: Diagnosis not present

## 2016-07-22 DIAGNOSIS — C9110 Chronic lymphocytic leukemia of B-cell type not having achieved remission: Secondary | ICD-10-CM | POA: Diagnosis not present

## 2016-07-22 DIAGNOSIS — Z7982 Long term (current) use of aspirin: Secondary | ICD-10-CM | POA: Diagnosis not present

## 2016-07-22 DIAGNOSIS — H409 Unspecified glaucoma: Secondary | ICD-10-CM | POA: Insufficient documentation

## 2016-07-22 DIAGNOSIS — N183 Chronic kidney disease, stage 3 (moderate): Secondary | ICD-10-CM | POA: Insufficient documentation

## 2016-07-22 DIAGNOSIS — D696 Thrombocytopenia, unspecified: Secondary | ICD-10-CM | POA: Diagnosis not present

## 2016-07-22 DIAGNOSIS — Z87891 Personal history of nicotine dependence: Secondary | ICD-10-CM | POA: Insufficient documentation

## 2016-07-22 DIAGNOSIS — E039 Hypothyroidism, unspecified: Secondary | ICD-10-CM | POA: Diagnosis not present

## 2016-07-22 LAB — CBC WITH DIFFERENTIAL/PLATELET
Basophils Absolute: 0 10*3/uL (ref 0–0.1)
Basophils Relative: 0 %
EOS PCT: 1 %
Eosinophils Absolute: 0.1 10*3/uL (ref 0–0.7)
HCT: 34.2 % — ABNORMAL LOW (ref 40.0–52.0)
Hemoglobin: 11.5 g/dL — ABNORMAL LOW (ref 13.0–18.0)
Lymphocytes Relative: 73 %
Lymphs Abs: 8.3 10*3/uL — ABNORMAL HIGH (ref 1.0–3.6)
MCH: 31.9 pg (ref 26.0–34.0)
MCHC: 33.6 g/dL (ref 32.0–36.0)
MCV: 94.8 fL (ref 80.0–100.0)
Monocytes Absolute: 0.4 10*3/uL (ref 0.2–1.0)
Monocytes Relative: 3 %
Neutro Abs: 2.7 10*3/uL (ref 1.4–6.5)
Neutrophils Relative %: 23 %
PLATELETS: 92 10*3/uL — AB (ref 150–440)
RBC: 3.61 MIL/uL — AB (ref 4.40–5.90)
RDW: 13.8 % (ref 11.5–14.5)
WBC: 11.5 10*3/uL — ABNORMAL HIGH (ref 3.8–10.6)

## 2016-07-24 ENCOUNTER — Encounter: Payer: Self-pay | Admitting: Oncology

## 2016-08-16 ENCOUNTER — Encounter: Payer: Self-pay | Admitting: Family Medicine

## 2016-08-16 ENCOUNTER — Ambulatory Visit (INDEPENDENT_AMBULATORY_CARE_PROVIDER_SITE_OTHER): Payer: Medicare Other | Admitting: Family Medicine

## 2016-08-16 VITALS — BP 124/78 | HR 51 | Temp 97.5°F | Wt 164.0 lb

## 2016-08-16 DIAGNOSIS — I2510 Atherosclerotic heart disease of native coronary artery without angina pectoris: Secondary | ICD-10-CM | POA: Diagnosis not present

## 2016-08-16 DIAGNOSIS — E1165 Type 2 diabetes mellitus with hyperglycemia: Secondary | ICD-10-CM | POA: Diagnosis not present

## 2016-08-16 DIAGNOSIS — C9190 Lymphoid leukemia, unspecified not having achieved remission: Secondary | ICD-10-CM

## 2016-08-16 DIAGNOSIS — C9110 Chronic lymphocytic leukemia of B-cell type not having achieved remission: Secondary | ICD-10-CM

## 2016-08-16 DIAGNOSIS — N183 Chronic kidney disease, stage 3 unspecified: Secondary | ICD-10-CM

## 2016-08-16 DIAGNOSIS — E1121 Type 2 diabetes mellitus with diabetic nephropathy: Secondary | ICD-10-CM | POA: Diagnosis not present

## 2016-08-16 DIAGNOSIS — I10 Essential (primary) hypertension: Secondary | ICD-10-CM | POA: Diagnosis not present

## 2016-08-16 DIAGNOSIS — IMO0002 Reserved for concepts with insufficient information to code with codable children: Secondary | ICD-10-CM

## 2016-08-16 LAB — BASIC METABOLIC PANEL
BUN: 61 mg/dL — ABNORMAL HIGH (ref 6–23)
CALCIUM: 10 mg/dL (ref 8.4–10.5)
CO2: 24 meq/L (ref 19–32)
Chloride: 110 mEq/L (ref 96–112)
Creatinine, Ser: 1.9 mg/dL — ABNORMAL HIGH (ref 0.40–1.50)
GFR: 36.79 mL/min — ABNORMAL LOW (ref >60.00)
Glucose, Bld: 110 mg/dL — ABNORMAL HIGH (ref 70–99)
Potassium: 4.5 mEq/L (ref 3.5–5.1)
SODIUM: 143 meq/L (ref 135–145)

## 2016-08-16 LAB — HEMOGLOBIN A1C: HEMOGLOBIN A1C: 8.5 % — AB (ref 4.6–6.5)

## 2016-08-16 NOTE — Patient Instructions (Addendum)
Labs today. You are doing well today. Continue current medicines Keep an eye on feet every day. Return in 6 months for medicare wellness with Katha Cabal and follow up with me.

## 2016-08-16 NOTE — Assessment & Plan Note (Signed)
Update Cr.  

## 2016-08-16 NOTE — Assessment & Plan Note (Signed)
Chronic, stable. Continue current regimen. 

## 2016-08-16 NOTE — Progress Notes (Signed)
BP 124/78   Pulse (!) 51   Temp 97.5 F (36.4 C) (Oral)   Wt 164 lb (74.4 kg)   SpO2 98%   BMI 23.53 kg/m    CC: f/u visit Subjective:    Patient ID: Scott [name], male    DOB: 31-Jul-1940, 76 y.o.   MRN: [id number]  HPI: Scott [name] is a 76 y.o. male presenting on 08/16/2016 for Follow-up   Wife passed away 09/03/15 of bone cancer.  Dog passed away 3 months ago (throat cancer). Has new schnauser.   Recent dx CLL - stage 0 and asymptomatic. Saw Dr [name]. No intervention needed at this time. Planned monitoring Q6 mo.   Brings extensive log of sugars and blood pressures which were reviewed.   DM - regularly does check sugars and brings log mostly fasting - 90-200s. High sugars when he has midnight snack. Compliant with antihyperglycemic regimen which includes: levemir 30u QHS and novolog flexpen 5u TID with meals (actually averages 2 meals per day). Denies low sugars or hypoglycemic symptoms. Nadir 76. Denies paresthesias. Last diabetic eye exam 02/2016.  UTD pneumococcal vaccines Lab Results  Component Value Date   HGBA1C 7.8 (H) 02/16/2016   Diabetic Foot Exam - Simple   Simple Foot Form Diabetic Foot exam was performed with the following findings:  Yes 08/16/2016  9:40 AM  Visual Inspection See comments:  Yes Sensation Testing Intact to touch and monofilament testing bilaterally:  Yes Pulse Check Posterior Tibialis and Dorsalis pulse intact bilaterally:  Yes Comments Left dorsal 2nd toe with fresh abrasion    HTN - Compliant with current antihypertensive regimen of amlodipine 5mg  daily, carvedilol 6.25mg  bid, lisinopril 20mg  daily. Does check blood pressures at home: 120-140/50-60s.  No low blood pressure readings or symptoms of dizziness/syncope. Rare dizziness despite bradycardia. Denies HA, vision changes, CP/tightness, SOB, leg swelling.    Relevant past medical, surgical, family and social history reviewed and updated as indicated. Interim medical history since  our last visit reviewed. Allergies and medications reviewed and updated. Outpatient Medications Prior to Visit  Medication Sig Dispense Refill  . acetaminophen (TYLENOL) 500 MG tablet Take 650 mg by mouth 2 (two) times daily. 2 pills two times a day    . amLODipine (NORVASC) 5 MG tablet Take 1 tablet (5 mg total) by mouth daily. 90 tablet 3  . aspirin EC 81 MG tablet Take 1 tablet (81 mg total) by mouth daily. 90 tablet 3  . atorvastatin (LIPITOR) 80 MG tablet Take 1 tablet (80 mg total) by mouth daily. 90 tablet 3  . carvedilol (COREG) 6.25 MG tablet Take 1 tablet (6.25 mg total) by mouth 2 (two) times daily. 180 tablet 3  . ferrous sulfate 325 (65 FE) MG tablet Take 325 mg by mouth daily with breakfast.    . furosemide (LASIX) 40 MG tablet Take 1 tablet (40 mg total) by mouth daily as needed for fluid (ankle swelling). 30 tablet 3  . glucose blood (ONE TOUCH ULTRA TEST) test strip Use to check sugar twice daily and as needed Dx:E11.65 300 each 3  . LEVEMIR FLEXTOUCH 100 UNIT/ML Pen INJECT 30 UNITS SUBCUTANEOUSLY DAILY AT 10 PM 15 pen 3  . levothyroxine (SYNTHROID, LEVOTHROID) 50 MCG tablet TAKE ONE TABLET BY MOUTH ONCE DAILY 90 tablet 3  . lisinopril (PRINIVIL,ZESTRIL) 20 MG tablet Take 1 tablet (20 mg total) by mouth daily. 90 tablet 2  . niacin (SLO-NIACIN) 500 MG tablet Take 1,000 mg by mouth at bedtime.     Marland Kitchen  nitroGLYCERIN (NITROSTAT) 0.4 MG SL tablet Place 1 tablet (0.4 mg total) under the tongue every 5 (five) minutes as needed for chest pain ((MAX of 3 doses)). 25 tablet 0  . NOVOLOG FLEXPEN 100 UNIT/ML FlexPen INJECT 5 UNITS SUBCUTANEOUSLY THREE TIMES DAILY WITH MEALS 15 pen 3  . traZODone (DESYREL) 50 MG tablet Take 0.5-1 tablets (25-50 mg total) by mouth at bedtime as needed for sleep. (Patient taking differently: Take 25 mg by mouth at bedtime. ) 30 tablet 3   No facility-administered medications prior to visit.      Per HPI unless specifically indicated in ROS section  below Review of Systems     Objective:    BP 124/78   Pulse (!) 51   Temp 97.5 F (36.4 C) (Oral)   Wt 164 lb (74.4 kg)   SpO2 98%   BMI 23.53 kg/m   Wt Readings from Last 3 Encounters:  08/16/16 164 lb (74.4 kg)  05/06/16 165 lb (74.8 kg)  04/22/16 165 lb 12.6 oz (75.2 kg)    Physical Exam  Constitutional: He appears well-developed and well-nourished. No distress.  HENT:  Head: Normocephalic and atraumatic.  Right Ear: External ear normal.  Left Ear: External ear normal.  Nose: Nose normal.  Mouth/Throat: Oropharynx is clear and moist. No oropharyngeal exudate.  Eyes: Conjunctivae and EOM are normal. Pupils are equal, round, and reactive to light. No scleral icterus.  Neck: Normal range of motion. Neck supple.  Cardiovascular: Normal rate, regular rhythm, normal heart sounds and intact distal pulses.   No murmur heard. Pulmonary/Chest: Effort normal and breath sounds normal. No respiratory distress. He has no wheezes. He has no rales.  Musculoskeletal: He exhibits no edema.  See HPI for foot exam if done  Lymphadenopathy:    He has no cervical adenopathy.  Skin: Skin is warm and dry. No rash noted.  Psychiatric: He has a normal mood and affect.  Nursing note and vitals reviewed.  Results for orders placed or performed in visit on 07/22/16  CBC with Differential  Result Value Ref Range   WBC 11.5 (H) 3.8 - 10.6 K/uL   RBC 3.61 (L) 4.40 - 5.90 MIL/uL   Hemoglobin 11.5 (L) 13.0 - 18.0 g/dL   HCT 34.2 (L) 40.0 - 52.0 %   MCV 94.8 80.0 - 100.0 fL   MCH 31.9 26.0 - 34.0 pg   MCHC 33.6 32.0 - 36.0 g/dL   RDW 13.8 11.5 - 14.5 %   Platelets 92 (L) 150 - 440 K/uL   Neutrophils Relative % 23 %   Neutro Abs 2.7 1.4 - 6.5 K/uL   Lymphocytes Relative 73 %   Lymphs Abs 8.3 (H) 1.0 - 3.6 K/uL   Monocytes Relative 3 %   Monocytes Absolute 0.4 0.2 - 1.0 K/uL   Eosinophils Relative 1 %   Eosinophils Absolute 0.1 0 - 0.7 K/uL   Basophils Relative 0 %   Basophils Absolute 0.0  0 - 0.1 K/uL      Assessment & Plan:   Problem List Items Addressed This Visit    CKD (chronic kidney disease) stage 3, GFR 30-59 ml/min    Update Cr.      CLL (chronic lymphocytic leukemia) ([facility])    Stage 0. Appreciate onc care. Discussed monitoring parameters and signs/symptoms to watch for.       Hypertension    Chronic, stable. Continue current regimen.       Uncontrolled type 2 diabetes mellitus with nephropathy ([place]) -  Primary    Update A1c today. Discussed scheduled meals and snack choices. No changes unless A1c trends agove 8%. Foot exam today.       Relevant Orders   Hemoglobin J2T   Basic metabolic panel       Follow up plan: Return in about 6 months (around 02/15/2017) for medicare wellness visit, follow up visit.  [name], MD

## 2016-08-16 NOTE — Assessment & Plan Note (Addendum)
Update A1c today. Discussed scheduled meals and snack choices. No changes unless A1c trends agove 8%. Foot exam today.

## 2016-08-16 NOTE — Assessment & Plan Note (Signed)
Stage 0. Appreciate onc care. Discussed monitoring parameters and signs/symptoms to watch for.

## 2016-08-21 DIAGNOSIS — H401132 Primary open-angle glaucoma, bilateral, moderate stage: Secondary | ICD-10-CM | POA: Diagnosis not present

## 2016-08-27 ENCOUNTER — Telehealth: Payer: Self-pay | Admitting: Family Medicine

## 2016-08-27 NOTE — Telephone Encounter (Signed)
Received sugar log.  He had one low reading 67 and a few highs otherwise ok.  Asked to scan. plz call - overall ok, let us know if further lows <70. Remind to check either before a meal or 2 hours after a meal.

## 2016-08-28 NOTE — Telephone Encounter (Signed)
Attempted to contact pt; mailbox full and unable to receive messages

## 2016-08-30 NOTE — Telephone Encounter (Signed)
Attempted to contact pt; mailbox full and unable to receive messages.

## 2016-09-17 ENCOUNTER — Encounter: Payer: Self-pay | Admitting: *Deleted

## 2016-09-17 NOTE — Telephone Encounter (Signed)
attempted to contact pt; unable to leave vm. Attempt to contact letter mailed

## 2016-09-25 NOTE — Telephone Encounter (Signed)
Spoke to pt and advised per Dr Darnell Level. Pt states his BS readings have been in the min 120s to low 130s

## 2016-09-28 ENCOUNTER — Other Ambulatory Visit: Payer: Self-pay | Admitting: Family Medicine

## 2016-10-09 DIAGNOSIS — N183 Chronic kidney disease, stage 3 (moderate): Secondary | ICD-10-CM | POA: Diagnosis not present

## 2016-10-09 DIAGNOSIS — I129 Hypertensive chronic kidney disease with stage 1 through stage 4 chronic kidney disease, or unspecified chronic kidney disease: Secondary | ICD-10-CM | POA: Diagnosis not present

## 2016-10-09 DIAGNOSIS — E1122 Type 2 diabetes mellitus with diabetic chronic kidney disease: Secondary | ICD-10-CM | POA: Diagnosis not present

## 2016-10-10 ENCOUNTER — Other Ambulatory Visit: Payer: Self-pay | Admitting: Family Medicine

## 2016-10-10 NOTE — Telephone Encounter (Signed)
Last filled 02/16/16 with 3 refills... Please advise

## 2016-10-12 NOTE — Telephone Encounter (Signed)
Reviewed new bp and sugar log - scanned. plz notify pt - bp good, sugars doing well. Continue current regimen. Lab Results  Component Value Date   HGBA1C 8.5 (H) 08/16/2016

## 2016-10-14 NOTE — Telephone Encounter (Signed)
Spoke to pt and advised per Dr G 

## 2016-10-21 ENCOUNTER — Encounter: Payer: Self-pay | Admitting: Oncology

## 2016-10-21 ENCOUNTER — Inpatient Hospital Stay: Payer: Medicare Other

## 2016-10-21 ENCOUNTER — Inpatient Hospital Stay: Payer: Medicare Other | Attending: Oncology | Admitting: Oncology

## 2016-10-21 VITALS — BP 127/71 | HR 57 | Temp 97.8°F | Resp 18 | Ht 70.0 in | Wt 164.7 lb

## 2016-10-21 DIAGNOSIS — I129 Hypertensive chronic kidney disease with stage 1 through stage 4 chronic kidney disease, or unspecified chronic kidney disease: Secondary | ICD-10-CM | POA: Insufficient documentation

## 2016-10-21 DIAGNOSIS — N183 Chronic kidney disease, stage 3 (moderate): Secondary | ICD-10-CM | POA: Insufficient documentation

## 2016-10-21 DIAGNOSIS — C9190 Lymphoid leukemia, unspecified not having achieved remission: Secondary | ICD-10-CM

## 2016-10-21 DIAGNOSIS — E785 Hyperlipidemia, unspecified: Secondary | ICD-10-CM | POA: Insufficient documentation

## 2016-10-21 DIAGNOSIS — C9110 Chronic lymphocytic leukemia of B-cell type not having achieved remission: Secondary | ICD-10-CM | POA: Insufficient documentation

## 2016-10-21 DIAGNOSIS — E119 Type 2 diabetes mellitus without complications: Secondary | ICD-10-CM | POA: Insufficient documentation

## 2016-10-21 DIAGNOSIS — Z79899 Other long term (current) drug therapy: Secondary | ICD-10-CM | POA: Diagnosis not present

## 2016-10-21 DIAGNOSIS — I252 Old myocardial infarction: Secondary | ICD-10-CM | POA: Diagnosis not present

## 2016-10-21 DIAGNOSIS — D696 Thrombocytopenia, unspecified: Secondary | ICD-10-CM | POA: Diagnosis not present

## 2016-10-21 DIAGNOSIS — H409 Unspecified glaucoma: Secondary | ICD-10-CM | POA: Insufficient documentation

## 2016-10-21 DIAGNOSIS — I2510 Atherosclerotic heart disease of native coronary artery without angina pectoris: Secondary | ICD-10-CM | POA: Insufficient documentation

## 2016-10-21 DIAGNOSIS — Z87891 Personal history of nicotine dependence: Secondary | ICD-10-CM | POA: Insufficient documentation

## 2016-10-21 LAB — CBC WITH DIFFERENTIAL/PLATELET
BASOS PCT: 0 %
Basophils Absolute: 0 10*3/uL (ref 0–0.1)
EOS ABS: 0.1 10*3/uL (ref 0–0.7)
EOS PCT: 1 %
HCT: 32.7 % — ABNORMAL LOW (ref 40.0–52.0)
Hemoglobin: 11 g/dL — ABNORMAL LOW (ref 13.0–18.0)
LYMPHS ABS: 8.9 10*3/uL — AB (ref 1.0–3.6)
Lymphocytes Relative: 70 %
MCH: 32.1 pg (ref 26.0–34.0)
MCHC: 33.7 g/dL (ref 32.0–36.0)
MCV: 95.2 fL (ref 80.0–100.0)
Monocytes Absolute: 0.3 10*3/uL (ref 0.2–1.0)
Monocytes Relative: 3 %
NEUTROS PCT: 26 %
Neutro Abs: 3.3 10*3/uL (ref 1.4–6.5)
Platelets: 92 10*3/uL — ABNORMAL LOW (ref 150–440)
RBC: 3.43 MIL/uL — AB (ref 4.40–5.90)
RDW: 13.7 % (ref 11.5–14.5)
WBC: 12.7 10*3/uL — AB (ref 3.8–10.6)

## 2016-10-21 NOTE — Progress Notes (Signed)
Pt has no complaints

## 2016-10-21 NOTE — Progress Notes (Signed)
.  [place]  [phone] [phone]  ID: [name] OB: [birth year]  MR#: [id number]  OMB#:[id number]  Patient Care Team: [name], MD as PCP - General (Family Medicine) Lloyd Huger, MD as Consulting Physician (Oncology) Clent Jacks, MD as Consulting Physician (Ophthalmology) Fleet Contras, MD as Consulting Physician (Nephrology) Dorothy Spark, MD as Consulting Physician (Cardiology)  CHIEF COMPLAINT: CLL.  INTERVAL HISTORY: Patient returns to clinic today for repeat laboratory work and further evaluation. He continues to feel well and remains asymptomatic. He has no neurologic complaints. He denies any recent fevers or illnesses. He has a good appetite and denies weight loss. He denies any night sweats. He has noted no lymphadenopathy. He has no chest pain or shortness of breath. He denies any nausea, vomiting, constipation, or diarrhea. He has no urinary complaints. Patient feels at his baseline and offers no specific complaints today.  REVIEW OF SYSTEMS:   Review of Systems  Constitutional: Negative.  Negative for fever, malaise/fatigue and weight loss.  Respiratory: Negative.  Negative for cough and shortness of breath.   Cardiovascular: Negative.  Negative for chest pain and leg swelling.  Gastrointestinal: Negative.  Negative for abdominal pain.  Genitourinary: Negative.   Musculoskeletal: Negative.   Neurological: Negative.  Negative for weakness.  Psychiatric/Behavioral: The patient is nervous/anxious.     As per HPI. Otherwise, a complete review of systems is negative.  PAST MEDICAL HISTORY: Past Medical History:  Diagnosis Date  . Acquired hand deformity 1962   hand saw accident at work  . Anemia 10/11/2011  . Arthritis   . Bradycardia 10/10/2011  . CAD (coronary artery disease) 10/10/2011   MI s/p PTCA (Dx-OM2 proximal concentric stenosis)  . CKD (chronic kidney disease) stage 3, GFR 30-59 ml/min      [name]  . CLL (chronic lymphocytic leukemia) ([place])   . Colon polyps   . Generalized headaches    frequent  . Glaucoma    s/p laser surgery  . HLD (hyperlipidemia)   . Hypertension   . Hypothyroidism 10/10/2011  . Thrombocytopenia ([place]) 10/11/2011  . Type 2 diabetes with nephropathy [facility])    DM refresher course [facility] (04/2013)    PAST SURGICAL HISTORY: Past Surgical History:  Procedure Laterality Date  . BACK SURGERY     cervical neck  . CATARACT EXTRACTION, BILATERAL Bilateral 2017  . COLONOSCOPY  11/2008   1 polyp, diverticulosis, rec rpt 5 yrs (Dr. Oletta Lamas, [name])  . COLONOSCOPY  06/2014   hyperplastic polyp, rpt 5 yrs ([place])  . EYE SURGERY  2012   laser surgery for glaucoma  . PTCA  1994, 1995  . US ECHOCARDIOGRAPHY  10/2013   inferior wall hypokinesis, mild LVH, EF 50-55%, mild MR and LA dilation    FAMILY HISTORY: Family History  Problem Relation Age of Onset  . Diabetes Sister   . Stomach cancer Sister 70  . Pneumonia Father        caused death  . Other Brother        no communication with brother so unsure of any health conditions  . Coronary artery disease Son 70       5v CABG and stents  . Hyperlipidemia Sister   . Stroke Neg Hx   . Heart attack Neg Hx     ADVANCED DIRECTIVES (Y/N):  N  HEALTH MAINTENANCE: Social History  Substance Use Topics  . Smoking status: Former Smoker    Quit date: 03/04/1978  . Smokeless tobacco:  Never Used  . Alcohol use No     Colonoscopy:  PAP:  Bone density:  Lipid panel:  No Known Allergies  Current Outpatient Prescriptions  Medication Sig Dispense Refill  . acetaminophen (TYLENOL) 500 MG tablet Take 650 mg by mouth 2 (two) times daily. 2 pills two times a day    . amLODipine (NORVASC) 5 MG tablet Take 1 tablet (5 mg total) by mouth daily. 90 tablet 3  . aspirin EC 81 MG tablet Take 1 tablet (81 mg total) by mouth daily. 90 tablet 3  . atorvastatin (LIPITOR) 80 MG tablet Take 1 tablet (80 mg total) by mouth  daily. 90 tablet 3  . carvedilol (COREG) 6.25 MG tablet Take 1 tablet (6.25 mg total) by mouth 2 (two) times daily. 180 tablet 3  . ferrous sulfate 325 (65 FE) MG tablet Take 325 mg by mouth daily with breakfast.    . furosemide (LASIX) 40 MG tablet Take 1 tablet (40 mg total) by mouth daily as needed for fluid (ankle swelling). (Patient taking differently: Take 40 mg by mouth every other day. ) 30 tablet 3  . glucose blood (ONE TOUCH ULTRA TEST) test strip Use to check sugar twice daily and as needed Dx:E11.65 300 each 3  . LEVEMIR FLEXTOUCH 100 UNIT/ML Pen INJECT 30 UNITS SUBCUTANEOUSLY ONCE DAILY AT  10PM 15 pen 3  . levothyroxine (SYNTHROID, LEVOTHROID) 50 MCG tablet TAKE ONE TABLET BY MOUTH ONCE DAILY 90 tablet 3  . lisinopril (PRINIVIL,ZESTRIL) 20 MG tablet Take 1 tablet (20 mg total) by mouth daily. 90 tablet 2  . niacin (SLO-NIACIN) 500 MG tablet Take 1,000 mg by mouth at bedtime.     . nitroGLYCERIN (NITROSTAT) 0.4 MG SL tablet Place 1 tablet (0.4 mg total) under the tongue every 5 (five) minutes as needed for chest pain ((MAX of 3 doses)). 25 tablet 0  . NOVOLOG FLEXPEN 100 UNIT/ML FlexPen INJECT 5 UNITS SUBCUTANEOUSLY THREE TIMES DAILY WITH MEALS 15 pen 3  . traZODone (DESYREL) 50 MG tablet TAKE ONE-HALF TO ONE TABLET BY MOUTH AT BEDTIME AS NEEDED FOR SLEEP 30 tablet 3   No current facility-administered medications for this visit.     OBJECTIVE: Vitals:   10/21/16 1448  BP: 127/71  Pulse: (!) 57  Resp: 18  Temp: 97.8 F (36.6 C)     Body mass index is 23.63 kg/m.    ECOG FS:0 - Asymptomatic  General: Well-developed, well-nourished, no acute distress. Eyes: Pink conjunctiva, anicteric sclera. Lungs: Clear to auscultation bilaterally. Heart: Regular rate and rhythm. No rubs, murmurs, or gallops. Abdomen: Soft, nontender, nondistended. No organomegaly noted, normoactive bowel sounds. Musculoskeletal: No edema, cyanosis, or clubbing. Neuro: Alert, answering all questions  appropriately. Cranial nerves grossly intact. Skin: No rashes or petechiae noted. Psych: Normal affect.  LAB RESULTS:  Lab Results  Component Value Date   NA 143 08/16/2016   K 4.5 08/16/2016   CL 110 08/16/2016   CO2 24 08/16/2016   GLUCOSE 110 (H) 08/16/2016   BUN 61 (H) 08/16/2016   CREATININE 1.90 (H) 08/16/2016   CALCIUM 10.0 08/16/2016   PROT 5.8 (L) 12/13/2015   ALBUMIN 3.8 12/13/2015   AST 18 12/13/2015   ALT 12 12/13/2015   ALKPHOS 48 12/13/2015   BILITOT 0.5 12/13/2015   GFRNONAA 57 (L) 11/13/2015   GFRAA >60 11/13/2015    Lab Results  Component Value Date   WBC 12.7 (H) 10/21/2016   NEUTROABS 3.3 10/21/2016   HGB 11.0 (L) 10/21/2016  HCT 32.7 (L) 10/21/2016   MCV 95.2 10/21/2016   PLT 92 (L) 10/21/2016     STUDIES: No results found.  ASSESSMENT: CLL, Rai stage 0.  PLAN:    1. CLL: Confirmed by peripheral blood flow cytometry. No intervention is needed at this time. Can consider imaging with CT scan in the future to assess for lymphadenopathy, but this is not necessary at this time. Patient does not require bone marrow biopsy. Return to clinic in 3 months with repeat laboratory work only and then in 6 months for laboratory work and further evaluation. 2. Thrombocytopenia: Likely secondary to CLL. Patient's platelet count trending down. Last two lab draws plt were 92. Discussed with Dr. [name] and we are ok to continue to monitor for now.   Approximately 20 minutes was spent in discussion of which greater than 50% was consultation.  Patient expressed understanding and was in agreement with this plan. He also understands that He can call clinic at any time with any questions, concerns, or complaints.    Jacquelin Hawking, NP   10/21/2016 3:06 PM

## 2016-11-05 ENCOUNTER — Other Ambulatory Visit: Payer: Medicare Other | Admitting: *Deleted

## 2016-11-05 DIAGNOSIS — I2510 Atherosclerotic heart disease of native coronary artery without angina pectoris: Secondary | ICD-10-CM | POA: Diagnosis not present

## 2016-11-05 DIAGNOSIS — E785 Hyperlipidemia, unspecified: Secondary | ICD-10-CM

## 2016-11-05 DIAGNOSIS — I10 Essential (primary) hypertension: Secondary | ICD-10-CM | POA: Diagnosis not present

## 2016-11-05 DIAGNOSIS — E784 Other hyperlipidemia: Secondary | ICD-10-CM | POA: Diagnosis not present

## 2016-11-05 DIAGNOSIS — E7849 Other hyperlipidemia: Secondary | ICD-10-CM

## 2016-11-05 LAB — COMPREHENSIVE METABOLIC PANEL
ALT: 11 IU/L (ref 0–44)
AST: 19 IU/L (ref 0–40)
Albumin/Globulin Ratio: 2.2 (ref 1.2–2.2)
Albumin: 4.4 g/dL (ref 3.5–4.8)
Alkaline Phosphatase: 60 IU/L (ref 39–117)
BUN/Creatinine Ratio: 27 — ABNORMAL HIGH (ref 10–24)
BUN: 41 mg/dL — ABNORMAL HIGH (ref 8–27)
Bilirubin Total: 0.5 mg/dL (ref 0.0–1.2)
CO2: 21 mmol/L (ref 20–29)
Calcium: 9.7 mg/dL (ref 8.6–10.2)
Chloride: 110 mmol/L — ABNORMAL HIGH (ref 96–106)
Creatinine, Ser: 1.54 mg/dL — ABNORMAL HIGH (ref 0.76–1.27)
GFR calc Af Amer: 50 mL/min/{1.73_m2} — ABNORMAL LOW (ref >59)
GFR calc non Af Amer: 43 mL/min/{1.73_m2} — ABNORMAL LOW (ref >59)
Globulin, Total: 2 g/dL (ref 1.5–4.5)
Glucose: 136 mg/dL — ABNORMAL HIGH (ref 65–99)
Potassium: 5.1 mmol/L (ref 3.5–5.2)
Sodium: 147 mmol/L — ABNORMAL HIGH (ref 134–144)
Total Protein: 6.4 g/dL (ref 6.0–8.5)

## 2016-11-06 ENCOUNTER — Telehealth: Payer: Self-pay | Admitting: Cardiology

## 2016-11-06 LAB — CBC WITH DIFFERENTIAL/PLATELET
Basophils Absolute: 0 10*3/uL (ref 0.0–0.2)
Basos: 0 %
EOS (ABSOLUTE): 0.2 10*3/uL (ref 0.0–0.4)
Eos: 1 %
Hematocrit: 32 % — ABNORMAL LOW (ref 37.5–51.0)
Hemoglobin: 10.7 g/dL — ABNORMAL LOW (ref 13.0–17.7)
Immature Grans (Abs): 0 10*3/uL (ref 0.0–0.1)
Immature Granulocytes: 0 %
Lymphocytes Absolute: 12.5 10*3/uL — ABNORMAL HIGH (ref 0.7–3.1)
Lymphs: 80 %
MCH: 30.7 pg (ref 26.6–33.0)
MCHC: 33.4 g/dL (ref 31.5–35.7)
MCV: 92 fL (ref 79–97)
Monocytes Absolute: 0.3 10*3/uL (ref 0.1–0.9)
Monocytes: 2 %
Neutrophils Absolute: 2.7 10*3/uL (ref 1.4–7.0)
Neutrophils: 17 %
Platelets: 90 10*3/uL — CL (ref 150–379)
RBC: 3.48 x10E6/uL — ABNORMAL LOW (ref 4.14–5.80)
RDW: 13.4 % (ref 12.3–15.4)
WBC: 15.7 10*3/uL — ABNORMAL HIGH (ref 3.4–10.8)

## 2016-11-06 LAB — LIPID PANEL
Chol/HDL Ratio: 2.9 ratio (ref 0.0–5.0)
Cholesterol, Total: 126 mg/dL (ref 100–199)
HDL: 43 mg/dL (ref >39)
LDL Calculated: 68 mg/dL (ref 0–99)
Triglycerides: 76 mg/dL (ref 0–149)
VLDL Cholesterol Cal: 15 mg/dL (ref 5–40)

## 2016-11-06 LAB — TSH: TSH: 2.09 u[IU]/mL (ref 0.450–4.500)

## 2016-11-06 NOTE — Telephone Encounter (Signed)
F/u message ° °Pt returning Rn call. Please call back to discuss  °

## 2016-11-06 NOTE — Telephone Encounter (Signed)
Notes recorded by Dorothy Spark, MD on 11/06/2016 at 7:54 AM EDT Improved Crea, stable CBC, normal TSH   Notified the pt of lab results per Dr Meda Coffee, as mentioned above. Pt verbalized understanding.

## 2016-11-13 DIAGNOSIS — H10522 Angular blepharoconjunctivitis, left eye: Secondary | ICD-10-CM | POA: Diagnosis not present

## 2016-11-13 DIAGNOSIS — H02834 Dermatochalasis of left upper eyelid: Secondary | ICD-10-CM | POA: Diagnosis not present

## 2016-11-13 DIAGNOSIS — H02831 Dermatochalasis of right upper eyelid: Secondary | ICD-10-CM | POA: Diagnosis not present

## 2017-01-20 ENCOUNTER — Inpatient Hospital Stay: Payer: Medicare Other | Attending: Oncology

## 2017-01-20 ENCOUNTER — Other Ambulatory Visit: Payer: Self-pay

## 2017-01-20 DIAGNOSIS — H409 Unspecified glaucoma: Secondary | ICD-10-CM | POA: Insufficient documentation

## 2017-01-20 DIAGNOSIS — E119 Type 2 diabetes mellitus without complications: Secondary | ICD-10-CM | POA: Diagnosis not present

## 2017-01-20 DIAGNOSIS — I252 Old myocardial infarction: Secondary | ICD-10-CM | POA: Insufficient documentation

## 2017-01-20 DIAGNOSIS — C9110 Chronic lymphocytic leukemia of B-cell type not having achieved remission: Secondary | ICD-10-CM | POA: Insufficient documentation

## 2017-01-20 DIAGNOSIS — I129 Hypertensive chronic kidney disease with stage 1 through stage 4 chronic kidney disease, or unspecified chronic kidney disease: Secondary | ICD-10-CM | POA: Diagnosis not present

## 2017-01-20 DIAGNOSIS — Z87891 Personal history of nicotine dependence: Secondary | ICD-10-CM | POA: Insufficient documentation

## 2017-01-20 DIAGNOSIS — Z79899 Other long term (current) drug therapy: Secondary | ICD-10-CM | POA: Insufficient documentation

## 2017-01-20 DIAGNOSIS — E785 Hyperlipidemia, unspecified: Secondary | ICD-10-CM | POA: Diagnosis not present

## 2017-01-20 DIAGNOSIS — D696 Thrombocytopenia, unspecified: Secondary | ICD-10-CM | POA: Diagnosis not present

## 2017-01-20 DIAGNOSIS — I2510 Atherosclerotic heart disease of native coronary artery without angina pectoris: Secondary | ICD-10-CM | POA: Insufficient documentation

## 2017-01-20 DIAGNOSIS — N183 Chronic kidney disease, stage 3 (moderate): Secondary | ICD-10-CM | POA: Insufficient documentation

## 2017-01-20 LAB — CBC WITH DIFFERENTIAL/PLATELET
BASOS PCT: 0 %
Basophils Absolute: 0 10*3/uL (ref 0–0.1)
Eosinophils Absolute: 0.2 10*3/uL (ref 0–0.7)
Eosinophils Relative: 1 %
HEMATOCRIT: 32.4 % — AB (ref 40.0–52.0)
HEMOGLOBIN: 10.7 g/dL — AB (ref 13.0–18.0)
LYMPHS ABS: 15.2 10*3/uL — AB (ref 1.0–3.6)
LYMPHS PCT: 77 %
MCH: 32.5 pg (ref 26.0–34.0)
MCHC: 33.1 g/dL (ref 32.0–36.0)
MCV: 98 fL (ref 80.0–100.0)
MONO ABS: 0.5 10*3/uL (ref 0.2–1.0)
MONOS PCT: 3 %
NEUTROS ABS: 3.7 10*3/uL (ref 1.4–6.5)
NEUTROS PCT: 19 %
Platelets: 93 10*3/uL — ABNORMAL LOW (ref 150–440)
RBC: 3.3 MIL/uL — ABNORMAL LOW (ref 4.40–5.90)
RDW: 13.8 % (ref 11.5–14.5)
WBC: 19.6 10*3/uL — ABNORMAL HIGH (ref 3.8–10.6)

## 2017-01-26 ENCOUNTER — Encounter: Payer: Self-pay | Admitting: Oncology

## 2017-01-29 ENCOUNTER — Encounter: Payer: Self-pay | Admitting: Cardiology

## 2017-01-29 NOTE — Telephone Encounter (Signed)
Left the pt a message to call back and ask the operator to assist him with scheduling his yearly follow-up appt with Dr Meda Coffee at her very next available.  Attempted to call him x 2 to assist with scheduling his appt, but he did not answer.

## 2017-01-29 NOTE — Telephone Encounter (Signed)
Spoke with the pt and scheduled his 1 year follow-up appt with Dr Meda Coffee on 03/14/17 at 0900.  Pt agreed to appt date and time.

## 2017-02-12 ENCOUNTER — Ambulatory Visit: Payer: Medicare Other

## 2017-02-12 ENCOUNTER — Telehealth: Payer: Self-pay | Admitting: Family Medicine

## 2017-02-12 DIAGNOSIS — N183 Chronic kidney disease, stage 3 unspecified: Secondary | ICD-10-CM

## 2017-02-12 DIAGNOSIS — E1121 Type 2 diabetes mellitus with diabetic nephropathy: Secondary | ICD-10-CM

## 2017-02-12 DIAGNOSIS — IMO0002 Reserved for concepts with insufficient information to code with codable children: Secondary | ICD-10-CM

## 2017-02-12 DIAGNOSIS — E039 Hypothyroidism, unspecified: Secondary | ICD-10-CM

## 2017-02-12 DIAGNOSIS — E1165 Type 2 diabetes mellitus with hyperglycemia: Secondary | ICD-10-CM

## 2017-02-12 DIAGNOSIS — D631 Anemia in chronic kidney disease: Secondary | ICD-10-CM

## 2017-02-12 NOTE — Telephone Encounter (Signed)
Rescheduled awv for 02/19/17. Pt aware

## 2017-02-12 NOTE — Telephone Encounter (Signed)
Ordered

## 2017-02-12 NOTE — Telephone Encounter (Signed)
Copied from [place] [phone]. Topic: Quick Communication - See Telephone Encounter >> Feb 12, 2017  1:55 PM Cleaster Corin, [place] wrote: CRM for notification. See Telephone encounter for:   02/12/17. Pt. Calling to reschedule [place]  sent pt. To Ebony Hail vm please give pt. A call back wants to get appt. Done before the end of the year

## 2017-02-12 NOTE — Telephone Encounter (Signed)
Please enter awv lab orders. Pt coming in tomorrow and annual OV is scheduled for Monday

## 2017-02-13 ENCOUNTER — Other Ambulatory Visit (INDEPENDENT_AMBULATORY_CARE_PROVIDER_SITE_OTHER): Payer: Medicare Other

## 2017-02-13 DIAGNOSIS — IMO0002 Reserved for concepts with insufficient information to code with codable children: Secondary | ICD-10-CM

## 2017-02-13 DIAGNOSIS — E1121 Type 2 diabetes mellitus with diabetic nephropathy: Secondary | ICD-10-CM

## 2017-02-13 DIAGNOSIS — E1165 Type 2 diabetes mellitus with hyperglycemia: Secondary | ICD-10-CM

## 2017-02-13 DIAGNOSIS — D631 Anemia in chronic kidney disease: Secondary | ICD-10-CM

## 2017-02-13 DIAGNOSIS — N183 Chronic kidney disease, stage 3 unspecified: Secondary | ICD-10-CM

## 2017-02-13 LAB — RENAL FUNCTION PANEL
ALBUMIN: 4.2 g/dL (ref 3.5–5.2)
BUN: 34 mg/dL — ABNORMAL HIGH (ref 6–23)
CHLORIDE: 113 meq/L — AB (ref 96–112)
CO2: 25 mEq/L (ref 19–32)
Calcium: 9.5 mg/dL (ref 8.4–10.5)
Creatinine, Ser: 1.41 mg/dL (ref 0.40–1.50)
GFR: 51.84 mL/min — AB (ref >60.00)
Glucose, Bld: 118 mg/dL — ABNORMAL HIGH (ref 70–99)
PHOSPHORUS: 3.3 mg/dL (ref 2.3–4.6)
POTASSIUM: 4.5 meq/L (ref 3.5–5.1)
SODIUM: 145 meq/L (ref 135–145)

## 2017-02-13 LAB — FERRITIN: Ferritin: 64.2 ng/mL (ref 22.0–322.0)

## 2017-02-13 LAB — HEMOGLOBIN A1C: Hgb A1c MFr Bld: 7.5 % — ABNORMAL HIGH (ref 4.6–6.5)

## 2017-02-17 ENCOUNTER — Ambulatory Visit (INDEPENDENT_AMBULATORY_CARE_PROVIDER_SITE_OTHER): Payer: Medicare Other | Admitting: Family Medicine

## 2017-02-17 ENCOUNTER — Encounter: Payer: Self-pay | Admitting: Family Medicine

## 2017-02-17 VITALS — BP 130/72 | HR 51 | Temp 98.0°F | Ht 70.5 in | Wt 160.0 lb

## 2017-02-17 DIAGNOSIS — E1165 Type 2 diabetes mellitus with hyperglycemia: Secondary | ICD-10-CM | POA: Diagnosis not present

## 2017-02-17 DIAGNOSIS — E1121 Type 2 diabetes mellitus with diabetic nephropathy: Secondary | ICD-10-CM

## 2017-02-17 DIAGNOSIS — Z23 Encounter for immunization: Secondary | ICD-10-CM

## 2017-02-17 DIAGNOSIS — N183 Chronic kidney disease, stage 3 unspecified: Secondary | ICD-10-CM

## 2017-02-17 DIAGNOSIS — IMO0002 Reserved for concepts with insufficient information to code with codable children: Secondary | ICD-10-CM

## 2017-02-17 DIAGNOSIS — C9190 Lymphoid leukemia, unspecified not having achieved remission: Secondary | ICD-10-CM

## 2017-02-17 DIAGNOSIS — Z7189 Other specified counseling: Secondary | ICD-10-CM

## 2017-02-17 DIAGNOSIS — I2510 Atherosclerotic heart disease of native coronary artery without angina pectoris: Secondary | ICD-10-CM | POA: Diagnosis not present

## 2017-02-17 DIAGNOSIS — C9110 Chronic lymphocytic leukemia of B-cell type not having achieved remission: Secondary | ICD-10-CM

## 2017-02-17 MED ORDER — INSULIN ASPART 100 UNIT/ML FLEXPEN: 5.0000 [IU] | PEN_INJECTOR | Freq: Every day | SUBCUTANEOUS | 3 refills | Status: DC

## 2017-02-17 MED ORDER — INSULIN DETEMIR 100 UNIT/ML FLEXPEN: 25.0000 [IU] | PEN_INJECTOR | Freq: Every day | SUBCUTANEOUS | 3 refills | Status: DC

## 2017-02-17 NOTE — Assessment & Plan Note (Signed)
Stable period. Appreciate onc care. F/u planned 04/2017.

## 2017-02-17 NOTE — Assessment & Plan Note (Addendum)
Chronic. Stable to improved. Continue to monitor.

## 2017-02-17 NOTE — Assessment & Plan Note (Signed)
Advanced directives - previously wanted full code but with wife's death experience, thinks may change this. Packet provided last year. Unsure HCPOA, but likely 2 sons.

## 2017-02-17 NOTE — Progress Notes (Signed)
BP 130/72 (BP Location: Left Arm, Patient Position: Sitting, Cuff Size: Normal)   Pulse (!) 51   Temp 98 F (36.7 C) (Oral)   Ht 5' 10.5" (1.791 m)   Wt 160 lb (72.6 kg)   SpO2 97%   BMI 22.63 kg/m    CC: AMW f/u visit  Subjective:    Patient ID: Scott [name], male    DOB: 1940-05-03, 76 y.o.   MRN: [id number]  HPI: Scott [name] is a 76 y.o. male presenting on 02/17/2017 for Annual Exam (Pt 2. Provided recent BP and BS readings)   AMW with [facility] rescheduled to this Wednesday due to snow.   Noticing increased knee pain. Would like to try cream but doesn't know name. Will call us with name.   Brings sugar and bp log which was reviewed. BP 120-140/60s, HR 50s. cbg's - 90-200s, he has had some hypoglycemic episodes. 63 on Sunday. Diabetic regimen is levemir 30 units daily at 10pm, novolog 5 units three times daily with meals. He usually eats breakfast and supper at 5pm, small snack with levemir at bedtime.   Preventative: COLONOSCOPY Date: 06/2014 hyperplastic polyp, rpt 5 yrs Oletta Lamas) Prostate screening - normal DRE and PSA 2015 - decided to stop screening. Flu shot yearly Pneumovax 2013. prevnar 07/2013 zostavax 2015 shingrix - discussed Advanced directives - previously wanted full code but with wife's death experience, thinks may change this. Packet provided last year. Unsure HCPOA, but likely 2 sons.  Seat belt use discussed.  Sunscreen use discussed.  Ex smoker Alcohol use - none  Lives with wife, 1 dog Occupation: retired, was route Hotel manager Edu: 11th gr Activity: walks dog 3-4x/day, yardwork, rides bicycle. Diet: drinks diet coke, no vegetables  Relevant past medical, surgical, family and social history reviewed and updated as indicated. Interim medical history since our last visit reviewed. Allergies and medications reviewed and updated. Outpatient Medications Prior to Visit  Medication Sig Dispense Refill  . acetaminophen (TYLENOL) 500 MG tablet Take 650 mg  by mouth 2 (two) times daily. 2 pills two times a day    . amLODipine (NORVASC) 5 MG tablet Take 1 tablet (5 mg total) by mouth daily. 90 tablet 3  . aspirin EC 81 MG tablet Take 1 tablet (81 mg total) by mouth daily. 90 tablet 3  . atorvastatin (LIPITOR) 80 MG tablet Take 1 tablet (80 mg total) by mouth daily. 90 tablet 3  . carvedilol (COREG) 6.25 MG tablet Take 1 tablet (6.25 mg total) by mouth 2 (two) times daily. 180 tablet 3  . ferrous sulfate 325 (65 FE) MG tablet Take 325 mg by mouth daily with breakfast.    . furosemide (LASIX) 40 MG tablet Take 1 tablet (40 mg total) by mouth daily as needed for fluid (ankle swelling). (Patient taking differently: Take 40 mg by mouth every other day. ) 30 tablet 3  . glucose blood (ONE TOUCH ULTRA TEST) test strip Use to check sugar twice daily and as needed Dx:E11.65 300 each 3  . levothyroxine (SYNTHROID, LEVOTHROID) 50 MCG tablet TAKE ONE TABLET BY MOUTH ONCE DAILY 90 tablet 3  . lisinopril (PRINIVIL,ZESTRIL) 20 MG tablet Take 1 tablet (20 mg total) by mouth daily. 90 tablet 2  . niacin (SLO-NIACIN) 500 MG tablet Take 1,000 mg by mouth at bedtime.     . nitroGLYCERIN (NITROSTAT) 0.4 MG SL tablet Place 1 tablet (0.4 mg total) under the tongue every 5 (five) minutes as needed for chest pain ((MAX of  3 doses)). 25 tablet 0  . traZODone (DESYREL) 50 MG tablet TAKE ONE-HALF TO ONE TABLET BY MOUTH AT BEDTIME AS NEEDED FOR SLEEP 30 tablet 3  . LEVEMIR FLEXTOUCH 100 UNIT/ML Pen INJECT 30 UNITS SUBCUTANEOUSLY ONCE DAILY AT  10PM 15 pen 3  . NOVOLOG FLEXPEN 100 UNIT/ML FlexPen INJECT 5 UNITS SUBCUTANEOUSLY THREE TIMES DAILY WITH MEALS 15 pen 3   No facility-administered medications prior to visit.      Per HPI unless specifically indicated in ROS section below Review of Systems     Objective:    BP 130/72 (BP Location: Left Arm, Patient Position: Sitting, Cuff Size: Normal)   Pulse (!) 51   Temp 98 F (36.7 C) (Oral)   Ht 5' 10.5" (1.791 m)   Wt 160  lb (72.6 kg)   SpO2 97%   BMI 22.63 kg/m   Wt Readings from Last 3 Encounters:  02/17/17 160 lb (72.6 kg)  10/21/16 164 lb 11.2 oz (74.7 kg)  08/16/16 164 lb (74.4 kg)    Physical Exam  Constitutional: He appears well-developed and well-nourished. No distress.  HENT:  Mouth/Throat: Oropharynx is clear and moist. No oropharyngeal exudate.  Cardiovascular: Normal rate, regular rhythm, normal heart sounds and intact distal pulses.  No murmur heard. Pulmonary/Chest: Effort normal and breath sounds normal. No respiratory distress. He has no wheezes. He has no rales.  Musculoskeletal: He exhibits edema (tr).  Skin: Skin is warm and dry. No rash noted.  Psychiatric: He has a normal mood and affect.  Nursing note and vitals reviewed.  Results for orders placed or performed in visit on 02/13/17  Ferritin  Result Value Ref Range   Ferritin 64.2 22.0 - 322.0 ng/mL  Hemoglobin A1c  Result Value Ref Range   Hgb A1c MFr Bld 7.5 (H) 4.6 - 6.5 %  Renal function panel  Result Value Ref Range   Sodium 145 135 - 145 mEq/L   Potassium 4.5 3.5 - 5.1 mEq/L   Chloride 113 (H) 96 - 112 mEq/L   CO2 25 19 - 32 mEq/L   Calcium 9.5 8.4 - 10.5 mg/dL   Albumin 4.2 3.5 - 5.2 g/dL   BUN 34 (H) 6 - 23 mg/dL   Creatinine, Ser 1.41 0.40 - 1.50 mg/dL   Glucose, Bld 118 (H) 70 - 99 mg/dL   Phosphorus 3.3 2.3 - 4.6 mg/dL   GFR 51.84 (L) >60.00 mL/min      Assessment & Plan:   Problem List Items Addressed This Visit    Advanced care planning/counseling discussion    Advanced directives - previously wanted full code but with wife's death experience, thinks may change this. Packet provided last year. Unsure HCPOA, but likely 2 sons.       CKD (chronic kidney disease) stage 3, GFR 30-59 ml/min (H[facility])    Chronic. Stable to improved. Continue to monitor.       CLL (chronic lymphocytic leukemia) ([facility])    Stable period. Appreciate onc care. F/u planned 04/2017.      Uncontrolled type 2 diabetes mellitus  with nephropathy ([facility]) - Primary    Chronic, stable control. Hypoglycemic episodes. Will change insulin regimen - decreased levemir to 25u nightly, stop breakfast novolog and only take with supper at 5pm. I did ask him to check sugars before and 2 hours after supper to monitor insulin effect.       Relevant Medications   Insulin Detemir (LEVEMIR FLEXTOUCH) 100 UNIT/ML Pen   insulin aspart (NOVOLOG FLEXPEN) 100  UNIT/ML FlexPen    Other Visit Diagnoses    Need for influenza vaccination       Relevant Orders   Flu Vaccine QUAD 36+ mos IM (Completed)       Follow up plan: Return in about 3 months (around 05/18/2017) for follow up visit.  [name], MD

## 2017-02-17 NOTE — Patient Instructions (Addendum)
Flu shot today Change around insulin - to levemir 25 units at bedtime, and novolog 5 units with supper.  If interested, check with pharmacy about new 2 shot shingles series (shingrix).  Return in 3 months for follow up visit.

## 2017-02-17 NOTE — Assessment & Plan Note (Addendum)
Chronic, stable control. Hypoglycemic episodes. Will change insulin regimen - decreased levemir to 25u nightly, stop breakfast novolog and only take with supper at 5pm. I did ask him to check sugars before and 2 hours after supper to monitor insulin effect.

## 2017-02-18 ENCOUNTER — Ambulatory Visit: Payer: Medicare Other

## 2017-02-19 ENCOUNTER — Ambulatory Visit (INDEPENDENT_AMBULATORY_CARE_PROVIDER_SITE_OTHER): Payer: Medicare Other

## 2017-02-19 VITALS — BP 110/64 | HR 54 | Temp 97.6°F | Ht 70.5 in | Wt 164.5 lb

## 2017-02-19 DIAGNOSIS — Z0000 Encounter for general adult medical examination without abnormal findings: Secondary | ICD-10-CM | POA: Diagnosis not present

## 2017-02-19 NOTE — Patient Instructions (Signed)
Scott [name] , Thank you for taking time to come for your Medicare Wellness Visit. I appreciate your ongoing commitment to your health goals. Please review the following plan we discussed and let me know if I can assist you in the future.   These are the goals we discussed: Goals    . Increase physical activity     Starting 02/19/2017, I will continue to walk at least 10 minutes daily.        This is a list of the screening recommended for you and due dates:  Health Maintenance  Topic Date Due  . DTaP/Tdap/Td vaccine (1 - Tdap) 02/11/2026*  . Tetanus Vaccine  02/11/2026*  . Eye exam for diabetics  02/20/2017  . Hemoglobin A1C  08/14/2017  . Complete foot exam   08/16/2017  . Colon Cancer Screening  06/08/2019  . Flu Shot  Completed  . Pneumonia vaccines  Completed  *Topic was postponed. The date shown is not the original due date.   Preventive Care for Adults  A healthy lifestyle and preventive care can promote health and wellness. Preventive health guidelines for adults include the following key practices.  . A routine yearly physical is a good way to check with your health care provider about your health and preventive screening. It is a chance to share any concerns and updates on your health and to receive a thorough exam.  . Visit your dentist for a routine exam and preventive care every 6 months. Brush your teeth twice a day and floss once a day. Good oral hygiene prevents tooth decay and gum disease.  . The frequency of eye exams is based on your age, health, family medical history, use  of contact lenses, and other factors. Follow your health care provider's recommendations for frequency of eye exams.  . Eat a healthy diet. Foods like vegetables, fruits, whole grains, low-fat dairy products, and lean protein foods contain the nutrients you need without too many calories. Decrease your intake of foods high in solid fats, added sugars, and salt. Eat the right amount of calories  for you. Get information about a proper diet from your health care provider, if necessary.  . Regular physical exercise is one of the most important things you can do for your health. Most adults should get at least 150 minutes of moderate-intensity exercise (any activity that increases your heart rate and causes you to sweat) each week. In addition, most adults need muscle-strengthening exercises on 2 or more days a week.  Silver Sneakers may be a benefit available to you. To determine eligibility, you may visit the website: www.silversneakers.com or contact program at 346-458-9488 Mon-Fri between 8AM-8PM.   . Maintain a healthy weight. The body mass index (BMI) is a screening tool to identify possible weight problems. It provides an estimate of body fat based on height and weight. Your health care provider can find your BMI and can help you achieve or maintain a healthy weight.   For adults 20 years and older: ? A BMI below 18.5 is considered underweight. ? A BMI of 18.5 to 24.9 is normal. ? A BMI of 25 to 29.9 is considered overweight. ? A BMI of 30 and above is considered obese.   . Maintain normal blood lipids and cholesterol levels by exercising and minimizing your intake of saturated fat. Eat a balanced diet with plenty of fruit and vegetables. Blood tests for lipids and cholesterol should begin at age 65 and be repeated every 5 years. If  your lipid or cholesterol levels are high, you are over 50, or you are at high risk for heart disease, you may need your cholesterol levels checked more frequently. Ongoing high lipid and cholesterol levels should be treated with medicines if diet and exercise are not working.  . If you smoke, find out from your health care provider how to quit. If you do not use tobacco, please do not start.  . If you choose to drink alcohol, please do not consume more than 2 drinks per day. One drink is considered to be 12 ounces (355 mL) of beer, 5 ounces (148 mL) of  wine, or 1.5 ounces (44 mL) of liquor.  . If you are 39-16 years old, ask your health care provider if you should take aspirin to prevent strokes.  . Use sunscreen. Apply sunscreen liberally and repeatedly throughout the day. You should seek shade when your shadow is shorter than you. Protect yourself by wearing long sleeves, pants, a wide-brimmed hat, and sunglasses year round, whenever you are outdoors.  . Once a month, do a whole body skin exam, using a mirror to look at the skin on your back. Tell your health care provider of new moles, moles that have irregular borders, moles that are larger than a pencil eraser, or moles that have changed in shape or color.

## 2017-02-19 NOTE — Progress Notes (Signed)
Pre visit review using our clinic review tool, if applicable. No additional management support is needed unless otherwise documented below in the visit note. 

## 2017-02-19 NOTE — Progress Notes (Signed)
Subjective:   Scott [name] is a 76 y.o. male who presents for Medicare Annual/Subsequent preventive examination.  Review of Systems:  N/A Cardiac Risk Factors include: advanced age (>2men, >51 women);male gender;dyslipidemia;diabetes mellitus;hypertension     Objective:    Vitals: BP 110/64 (BP Location: Left Arm, Patient Position: Sitting, Cuff Size: Normal)   Pulse (!) 54   Temp 97.6 F (36.4 C) (Oral)   Ht 5' 10.5" (1.791 m)   Wt 164 lb 8 oz (74.6 kg)   SpO2 98%   BMI 23.27 kg/m   Body mass index is 23.27 kg/m.  Advanced Directives 02/19/2017 10/21/2016 04/22/2016 02/12/2016 01/22/2016 11/13/2015 10/21/2015  Does Patient Have a Medical Advance Directive? No No No No No No No  Would patient like information on creating a medical advance directive? Yes (MAU/Ambulatory/Procedural Areas - Information given) No - Patient declined (No Data) - No - patient declined information No - patient declined information -  Pre-existing out of facility DNR order (yellow form or pink MOST form) - - - - - - -    Tobacco Social History   Tobacco Use  Smoking Status Former Smoker  . Last attempt to quit: 03/04/1978  . Years since quitting: 38.9  Smokeless Tobacco Never Used     Counseling given: No   Clinical Intake:  Pre-visit preparation completed: Yes  Pain : No/denies pain Pain Score: 0-No pain     Nutritional Status: BMI of 19-24  Normal Nutritional Risks: None Diabetes: Yes CBG done?: No Did pt. bring in CBG monitor from home?: No  How often do you need to have someone help you when you read instructions, pamphlets, or other written materials from your doctor or pharmacy?: 1 - Never What is the last grade level you completed in school?: 11th grade  Interpreter Needed?: No  Comments: pt is a widower and lives alone Information entered by :: LPinson, LPN  Past Medical History:  Diagnosis Date  . Acquired hand deformity 1962   hand saw accident at work  . Anemia  10/11/2011  . Arthritis   . Bradycardia 10/10/2011  . CAD (coronary artery disease) 10/10/2011   MI s/p PTCA (Dx-OM2 proximal concentric stenosis)  . CKD (chronic kidney disease) stage 3, GFR 30-59 ml/min (HCC)    [name]  . CLL (chronic lymphocytic leukemia) ([place])   . Colon polyps   . Generalized headaches    frequent  . Glaucoma    s/p laser surgery  . HLD (hyperlipidemia)   . Hypertension   . Hypothyroidism 10/10/2011  . Thrombocytopenia ([place]) 10/11/2011  . Type 2 diabetes with nephropathy [facility])    DM refresher course [facility] (04/2013)   Past Surgical History:  Procedure Laterality Date  . BACK SURGERY     cervical neck  . CATARACT EXTRACTION, BILATERAL Bilateral 2017  . COLONOSCOPY  11/2008   1 polyp, diverticulosis, rec rpt 5 yrs (Dr. Oletta Lamas, [name])  . COLONOSCOPY  06/2014   hyperplastic polyp, rpt 5 yrs ([facility])  . EYE SURGERY  2012   laser surgery for glaucoma  . PTCA  1994, 1995  . US ECHOCARDIOGRAPHY  10/2013   inferior wall hypokinesis, mild LVH, EF 50-55%, mild MR and LA dilation   Family History  Problem Relation Age of Onset  . Diabetes Sister   . Stomach cancer Sister 108  . Pneumonia Father        caused death  . Other Brother        no communication with brother  so unsure of any health conditions  . Coronary artery disease Son 9       5v CABG and stents  . Hyperlipidemia Sister   . Stroke Neg Hx   . Heart attack Neg Hx    Social History   Socioeconomic History  . Marital status: Married    Spouse name: None  . Number of children: None  . Years of education: None  . Highest education level: None  Social Needs  . Financial resource strain: None  . Food insecurity - worry: None  . Food insecurity - inability: None  . Transportation needs - medical: None  . Transportation needs - non-medical: None  Occupational History  . None  Tobacco Use  . Smoking status: Former Smoker    Last attempt to quit: 03/04/1978    Years since quitting: 38.9  . Smokeless  tobacco: Never Used  Substance and Sexual Activity  . Alcohol use: No  . Drug use: No  . Sexual activity: No  Other Topics Concern  . None  Social History Narrative   Widower. Wife passed 08/2015 from cancer. 1 dog   Occupation: retired, was route Hotel manager   Edu: 11th gr   Activity: walks dog (pomeranian) 3-4x/day, yardwork, rides bicycle.   Diet: drinks diet coke, no vegetables    Outpatient Encounter Medications as of 02/19/2017  Medication Sig  . acetaminophen (TYLENOL) 500 MG tablet Take 650 mg by mouth 2 (two) times daily. 2 pills two times a day  . amLODipine (NORVASC) 5 MG tablet Take 1 tablet (5 mg total) by mouth daily.  Marland Kitchen aspirin EC 81 MG tablet Take 1 tablet (81 mg total) by mouth daily.  Marland Kitchen atorvastatin (LIPITOR) 80 MG tablet Take 1 tablet (80 mg total) by mouth daily.  . carvedilol (COREG) 6.25 MG tablet Take 1 tablet (6.25 mg total) by mouth 2 (two) times daily.  . ferrous sulfate 325 (65 FE) MG tablet Take 325 mg by mouth daily with breakfast.  . furosemide (LASIX) 40 MG tablet Take 1 tablet (40 mg total) by mouth daily as needed for fluid (ankle swelling). (Patient taking differently: Take 40 mg by mouth every other day. )  . glucose blood (ONE TOUCH ULTRA TEST) test strip Use to check sugar twice daily and as needed Dx:E11.65  . insulin aspart (NOVOLOG FLEXPEN) 100 UNIT/ML FlexPen Inject 5 Units into the skin daily with supper. (Patient taking differently: Inject into the skin. )  . Insulin Detemir (LEVEMIR FLEXTOUCH) 100 UNIT/ML Pen Inject 25 Units into the skin daily at 10 pm.  . levothyroxine (SYNTHROID, LEVOTHROID) 50 MCG tablet TAKE ONE TABLET BY MOUTH ONCE DAILY  . lisinopril (PRINIVIL,ZESTRIL) 20 MG tablet Take 1 tablet (20 mg total) by mouth daily.  . niacin (SLO-NIACIN) 500 MG tablet Take 1,000 mg by mouth at bedtime.   . nitroGLYCERIN (NITROSTAT) 0.4 MG SL tablet Place 1 tablet (0.4 mg total) under the tongue every 5 (five) minutes as needed for chest pain ((MAX  of 3 doses)).  Marland Kitchen traZODone (DESYREL) 50 MG tablet TAKE ONE-HALF TO ONE TABLET BY MOUTH AT BEDTIME AS NEEDED FOR SLEEP   No facility-administered encounter medications on file as of 02/19/2017.     Activities of Daily Living In your present state of health, do you have any difficulty performing the following activities: 02/19/2017  Hearing? N  Vision? N  Difficulty concentrating or making decisions? N  Walking or climbing stairs? N  Dressing or bathing? N  Doing errands, shopping?  N  Preparing Food and eating ? N  Using the Toilet? N  In the past six months, have you accidently leaked urine? N  Do you have problems with loss of bowel control? N  Managing your Medications? N  Managing your Finances? N  Housekeeping or managing your Housekeeping? N  Some recent data might be hidden    Patient Care Team: [name], MD as PCP - General (Family Medicine) [name], Kathlene November, MD as Consulting Physician (Oncology) Clent Jacks, MD as Consulting Physician (Ophthalmology) Fleet Contras, MD as Consulting Physician (Nephrology) Dorothy Spark, MD as Consulting Physician (Cardiology)   Assessment:   This is a routine wellness examination for [place].  Hearing Screening   125Hz  250Hz  500Hz  1000Hz  2000Hz  3000Hz  4000Hz  6000Hz  8000Hz   Right ear:   40 40 40  0    Left ear:   40 40 0  0    Vision Screening Comments: Jun 2018 with Dr. Katy Fitch  Exercise Activities and Dietary recommendations Current Exercise Habits: Home exercise routine, Type of exercise: treadmill;walking, Time (Minutes): 10, Frequency (Times/Week): 7, Weekly Exercise (Minutes/Week): 70, Intensity: Mild, Exercise limited by: None identified  Goals    . Increase physical activity     Starting 02/19/2017, I will continue to walk at least 10 minutes daily.        Fall Risk Fall Risk  02/19/2017 02/12/2016 02/08/2015 01/25/2014 07/06/2012  Falls in the past year? No No Yes No Yes  Number falls in past yr: - - 1  - 1  Comment - - Tripped over "junk" in garage - Walking the dog and slipped in the mud  Injury with Fall? - - No - No   Depression Screen PHQ 2/9 Scores 02/19/2017 02/12/2016 02/08/2015 01/25/2014  PHQ - 2 Score 0 0 0 0  PHQ- 9 Score 0 - - -    Cognitive Function MMSE - Mini Mental State Exam 02/19/2017 02/12/2016  Orientation to time 5 5  Orientation to Place 5 5  Registration 3 3  Attention/ Calculation 0 0  Recall 1 3  Recall-comments unable to recall 2 of 3 words -  Language- name 2 objects 0 0  Language- repeat 1 1  Language- follow 3 step command 3 3  Language- read & follow direction 0 0  Write a sentence 0 0  Copy design 0 0  Total score 18 20     PLEASE NOTE: A Mini-Cog screen was completed. Maximum score is 20. A value of 0 denotes this part of [place] MMSE was not completed or the patient failed this part of the Mini-Cog screening.   Mini-Cog Screening Orientation to Time - Max 5 pts Orientation to Place - Max 5 pts Registration - Max 3 pts Recall - Max 3 pts Language Repeat - Max 1 pts Language Follow 3 Step Command - Max 3 pts     Immunization History  Administered Date(s) Administered  . Influenza Split 12/03/2011  . Influenza,inj,Quad PF,6+ Mos 11/06/2012, 01/25/2014, 02/08/2015, 01/03/2016, 02/17/2017  . Pneumococcal Conjugate-13 07/21/2013  . Pneumococcal Polysaccharide-23 03/11/2011  . Zoster 03/17/2013    Screening Tests Health Maintenance  Topic Date Due  . DTaP/Tdap/Td (1 - Tdap) 02/11/2026 (Originally 06/02/1959)  . TETANUS/TDAP  02/11/2026 (Originally 06/02/1959)  . OPHTHALMOLOGY EXAM  02/20/2017  . HEMOGLOBIN A1C  08/14/2017  . FOOT EXAM  08/16/2017  . COLONOSCOPY  06/08/2019  . INFLUENZA VACCINE  Completed  . PNA vac Low Risk Adult  Completed  Plan:     I have personally reviewed, addressed, and noted the following in the patient's chart:  A. Medical and social history B. Use of alcohol, tobacco or illicit drugs  C. Current  medications and supplements D. Functional ability and status E.  Nutritional status F.  Physical activity G. Advance directives H. List of other physicians I.  Hospitalizations, surgeries, and ER visits in previous 12 months J.  [place] to include hearing, vision, cognitive, depression L. Referrals and appointments - none  In addition, I have reviewed and discussed with patient certain preventive protocols, quality metrics, and best practice recommendations. A written personalized care plan for preventive services as well as general preventive health recommendations were provided to patient.  See attached scanned questionnaire for additional information.   Signed,   Lindell Noe, MHA, BS, LPN Health Coach

## 2017-02-19 NOTE — Progress Notes (Signed)
PCP notes:   Health maintenance:  No gaps identified.  Abnormal screenings:   Mini-Cog score: 18/20 Hearing - failed  Hearing Screening   125Hz  250Hz  500Hz  1000Hz  2000Hz  3000Hz  4000Hz  6000Hz  8000Hz   Right ear:   40 40 40  0    Left ear:   40 40 0  0     Patient concerns:   Elevated blood glucose readings - PCP was provided a log of patient's blood glucose readings. PCP changed dosage and frequency of Insulin Aspart as well as instruct patient to continue recording blood glucose readings. Patient verbalized understanding. Patient's medication list updated in Epic.   Nurse concerns:  None  Next PCP appt:   05/19/17 @ 0915

## 2017-02-22 NOTE — Progress Notes (Signed)
I reviewed health advisor's note, was available for consultation, and agree with documentation and plan.  

## 2017-02-26 ENCOUNTER — Other Ambulatory Visit: Payer: Self-pay | Admitting: Family Medicine

## 2017-02-26 DIAGNOSIS — H409 Unspecified glaucoma: Secondary | ICD-10-CM | POA: Insufficient documentation

## 2017-02-26 DIAGNOSIS — K635 Polyp of colon: Secondary | ICD-10-CM | POA: Insufficient documentation

## 2017-02-26 DIAGNOSIS — R51 Headache: Secondary | ICD-10-CM

## 2017-02-26 DIAGNOSIS — E02 Subclinical iodine-deficiency hypothyroidism: Secondary | ICD-10-CM

## 2017-02-26 DIAGNOSIS — M1990 Unspecified osteoarthritis, unspecified site: Secondary | ICD-10-CM | POA: Insufficient documentation

## 2017-03-05 ENCOUNTER — Emergency Department
Admission: EM | Admit: 2017-03-05 | Discharge: 2017-03-05 | Disposition: A | Discharge status: Home / Self Care | Payer: Medicare Other | Attending: Emergency Medicine | Admitting: Emergency Medicine

## 2017-03-05 ENCOUNTER — Other Ambulatory Visit: Payer: Self-pay

## 2017-03-05 DIAGNOSIS — I2510 Atherosclerotic heart disease of native coronary artery without angina pectoris: Secondary | ICD-10-CM | POA: Diagnosis not present

## 2017-03-05 DIAGNOSIS — I129 Hypertensive chronic kidney disease with stage 1 through stage 4 chronic kidney disease, or unspecified chronic kidney disease: Secondary | ICD-10-CM | POA: Diagnosis not present

## 2017-03-05 DIAGNOSIS — Z7982 Long term (current) use of aspirin: Secondary | ICD-10-CM | POA: Diagnosis not present

## 2017-03-05 DIAGNOSIS — Z87891 Personal history of nicotine dependence: Secondary | ICD-10-CM | POA: Diagnosis not present

## 2017-03-05 DIAGNOSIS — Z856 Personal history of leukemia: Secondary | ICD-10-CM | POA: Diagnosis not present

## 2017-03-05 DIAGNOSIS — Z794 Long term (current) use of insulin: Secondary | ICD-10-CM | POA: Diagnosis not present

## 2017-03-05 DIAGNOSIS — E039 Hypothyroidism, unspecified: Secondary | ICD-10-CM | POA: Diagnosis not present

## 2017-03-05 DIAGNOSIS — N183 Chronic kidney disease, stage 3 (moderate): Secondary | ICD-10-CM | POA: Insufficient documentation

## 2017-03-05 DIAGNOSIS — Z79899 Other long term (current) drug therapy: Secondary | ICD-10-CM | POA: Diagnosis not present

## 2017-03-05 DIAGNOSIS — E162 Hypoglycemia, unspecified: Secondary | ICD-10-CM

## 2017-03-05 DIAGNOSIS — E1140 Type 2 diabetes mellitus with diabetic neuropathy, unspecified: Secondary | ICD-10-CM | POA: Insufficient documentation

## 2017-03-05 DIAGNOSIS — E11649 Type 2 diabetes mellitus with hypoglycemia without coma: Secondary | ICD-10-CM | POA: Diagnosis not present

## 2017-03-05 DIAGNOSIS — I252 Old myocardial infarction: Secondary | ICD-10-CM | POA: Insufficient documentation

## 2017-03-05 LAB — CBC
HEMATOCRIT: 35.6 % — AB (ref 40.0–52.0)
Hemoglobin: 11.6 g/dL — ABNORMAL LOW (ref 13.0–18.0)
MCH: 31.7 pg (ref 26.0–34.0)
MCHC: 32.6 g/dL (ref 32.0–36.0)
MCV: 97.2 fL (ref 80.0–100.0)
PLATELETS: 98 10*3/uL — AB (ref 150–440)
RBC: 3.66 MIL/uL — ABNORMAL LOW (ref 4.40–5.90)
RDW: 13.7 % (ref 11.5–14.5)
WBC: 28.4 10*3/uL — AB (ref 3.8–10.6)

## 2017-03-05 LAB — COMPREHENSIVE METABOLIC PANEL
ALBUMIN: 4.8 g/dL (ref 3.5–5.0)
ALT: 18 U/L (ref 17–63)
AST: 33 U/L (ref 15–41)
Alkaline Phosphatase: 66 U/L (ref 38–126)
Anion gap: 10 (ref 5–15)
BUN: 37 mg/dL — AB (ref 6–20)
CHLORIDE: 111 mmol/L (ref 101–111)
CO2: 25 mmol/L (ref 22–32)
Calcium: 10.2 mg/dL (ref 8.9–10.3)
Creatinine, Ser: 1.36 mg/dL — ABNORMAL HIGH (ref 0.61–1.24)
GFR calc Af Amer: 57 mL/min — ABNORMAL LOW (ref >60)
GFR calc non Af Amer: 49 mL/min — ABNORMAL LOW (ref >60)
GLUCOSE: 39 mg/dL — AB (ref 65–99)
POTASSIUM: 3.6 mmol/L (ref 3.5–5.1)
SODIUM: 146 mmol/L — AB (ref 135–145)
Total Bilirubin: 1 mg/dL (ref 0.3–1.2)
Total Protein: 7.6 g/dL (ref 6.5–8.1)

## 2017-03-05 LAB — GLUCOSE, CAPILLARY
GLUCOSE-CAPILLARY: 56 mg/dL — AB (ref 65–99)
GLUCOSE-CAPILLARY: 62 mg/dL — AB (ref 65–99)
GLUCOSE-CAPILLARY: 81 mg/dL (ref 65–99)
GLUCOSE-CAPILLARY: 119 mg/dL — AB (ref 65–99)
GLUCOSE-CAPILLARY: 132 mg/dL — AB (ref 65–99)
Glucose-Capillary: 103 mg/dL — ABNORMAL HIGH (ref 65–99)
Glucose-Capillary: 157 mg/dL — ABNORMAL HIGH (ref 65–99)
Glucose-Capillary: 42 mg/dL — CL (ref 65–99)
Glucose-Capillary: 59 mg/dL — ABNORMAL LOW (ref 65–99)

## 2017-03-05 MED ORDER — DEXTROSE 50 % IV SOLN
INTRAVENOUS | Status: AC
  Administered 2017-03-05: 50 mL via INTRAVENOUS
  Filled 2017-03-05: qty 50

## 2017-03-05 MED ORDER — DEXTROSE-NACL 5-0.9 % IV SOLN
INTRAVENOUS | Status: DC
  Administered 2017-03-05: 01:00:00 via INTRAVENOUS

## 2017-03-05 MED ORDER — DEXTROSE 50 % IV SOLN
1.0000 | Freq: Once | INTRAVENOUS | Status: AC
Start: 2017-03-05 — End: 2017-03-05
  Administered 2017-03-05: 50 mL via INTRAVENOUS

## 2017-03-05 MED ORDER — DEXTROSE 50 % IV SOLN
1.0000 | Freq: Once | INTRAVENOUS | Status: AC
  Administered 2017-03-05: 50 mL via INTRAVENOUS

## 2017-03-05 NOTE — Discharge Instructions (Addendum)
Please follow up with your primary care physician and please be careful when taking your medications

## 2017-03-05 NOTE — ED Notes (Signed)
Patient assisted to toilet. States he fells much better. Will continue to monitor

## 2017-03-05 NOTE — ED Triage Notes (Signed)
Pt states 45 min prior to coming in he accidentally took 25 u of his humolog versus his lantus. FSBS prior to coming was 59.

## 2017-03-05 NOTE — ED Notes (Signed)
Pt given graham crackers and peanut butter.

## 2017-03-05 NOTE — ED Provider Notes (Addendum)
[facility] Emergency Department Provider Note   ____________________________________________   First MD Initiated Contact with Patient 03/05/17 [phone]     (approximate)  I have reviewed the triage vital signs and the nursing notes.   HISTORY  Chief Complaint Hypoglycemia    HPI Scott [name] is a 77 y.o. male who comes into the hospital today with hypoglycemia.  The patient states that he took 25 units of his Humalog instead of his Lantus.  He reports that he realized what he had done so he came right into the hospital.  He states that he was feeling a little shaky and lightheaded when he came in.  His blood sugars were in the 80s at home and then 50s when he arrived here.  They dropped into the 40s by the time he got into her room.  The patient states that he drank some orange juice and took some glucagon prior to coming in.  The patient states that the pens were on the desk and he just picked up around 1 because he was distracted.  He is here for evaluation and treatment.   Past Medical History:  Diagnosis Date  . Acquired hand deformity 1962   hand saw accident at work  . Anemia 10/11/2011  . Arthritis   . Bradycardia 10/10/2011  . CAD (coronary artery disease) 10/10/2011   MI s/p PTCA (Dx-OM2 proximal concentric stenosis)  . CKD (chronic kidney disease) stage 3, GFR 30-59 ml/min (HCC)    [name]  . CLL (chronic lymphocytic leukemia) ([place])   . Colon polyps   . Generalized headaches    frequent  . Glaucoma    s/p laser surgery  . HLD (hyperlipidemia)   . Hypertension   . Hypothyroidism 10/10/2011  . Thrombocytopenia ([place]) 10/11/2011  . Type 2 diabetes with nephropathy [facility])    DM refresher course [facility] (04/2013)    Patient Active Problem List   Diagnosis Date Noted  . Type 2 diabetes with nephropathy ([place])   . Glaucoma   . Generalized headaches   . Colon polyps   . Arthritis   . CLL (chronic lymphocytic leukemia) ([place]) 01/18/2016  . Grieving  01/03/2016  . Chronic insomnia 01/03/2016  . Diabetic neuropathy ([place]) 06/09/2015  . BPV (benign positional vertigo) 05/23/2014  . Advanced care planning/counseling discussion 01/25/2014  . Chest pain 10/26/2013  . Medicare annual wellness visit, subsequent 07/06/2012  . HLD (hyperlipidemia)   . CKD (chronic kidney disease) stage 3, GFR 30-59 ml/min (HCC)   . Uncontrolled type 2 diabetes mellitus with nephropathy ([place])   . Anemia in CKD (chronic kidney disease) 10/11/2011  . Thrombocytopenia ([place]) 10/11/2011  . Anemia 10/11/2011  . CAD (coronary artery disease) 10/10/2011  . Hypertension 10/10/2011  . Hypothyroidism 10/10/2011  . Bradycardia 10/10/2011  . Acquired hand deformity 03/04/1960    Past Surgical History:  Procedure Laterality Date  . BACK SURGERY     cervical neck  . CATARACT EXTRACTION, BILATERAL Bilateral 2017  . COLONOSCOPY  11/2008   1 polyp, diverticulosis, rec rpt 5 yrs (Dr. Oletta Lamas, [name])  . COLONOSCOPY  06/2014   hyperplastic polyp, rpt 5 yrs ([facility])  . EYE SURGERY  2012   laser surgery for glaucoma  . PTCA  1994, 1995  . US ECHOCARDIOGRAPHY  10/2013   inferior wall hypokinesis, mild LVH, EF 50-55%, mild MR and LA dilation    Prior to Admission medications   Medication Sig Start Date End Date Taking? Authorizing Provider  acetaminophen (  TYLENOL) 500 MG tablet Take 650 mg by mouth 2 (two) times daily. 2 pills two times a day    [provider]  amLODipine (NORVASC) 5 MG tablet Take 1 tablet (5 mg total) by mouth daily. 05/06/16   Dorothy Spark, MD  aspirin EC 81 MG tablet Take 1 tablet (81 mg total) by mouth daily. 02/01/16   Dorothy Spark, MD  atorvastatin (LIPITOR) 80 MG tablet Take 1 tablet (80 mg total) by mouth daily. 05/06/16   Dorothy Spark, MD  carvedilol (COREG) 6.25 MG tablet Take 1 tablet (6.25 mg total) by mouth 2 (two) times daily. 05/06/16   Dorothy Spark, MD  ferrous sulfate 325 (65 FE) MG tablet Take 325 mg by mouth  daily with breakfast.    [provider]  furosemide (LASIX) 40 MG tablet Take 1 tablet (40 mg total) by mouth daily as needed for fluid (ankle swelling). Patient taking differently: Take 40 mg by mouth every other day.  05/06/16   Dorothy Spark, MD  glucose blood (ONE TOUCH ULTRA TEST) test strip Use to check sugar twice daily and as needed Dx:E11.65 02/02/16   [name], MD  insulin aspart (NOVOLOG FLEXPEN) 100 UNIT/ML FlexPen Inject 5 Units into the skin daily with supper. Patient taking differently: Inject into the skin.  02/17/17   [name], MD  Insulin Detemir (LEVEMIR FLEXTOUCH) 100 UNIT/ML Pen Inject 25 Units into the skin daily at 10 pm. 02/17/17   [name], MD  levothyroxine (SYNTHROID, LEVOTHROID) 50 MCG tablet TAKE ONE TABLET BY MOUTH ONCE DAILY 02/26/17   [name], MD  lisinopril (PRINIVIL,ZESTRIL) 20 MG tablet Take 1 tablet (20 mg total) by mouth daily. 05/06/16   Dorothy Spark, MD  niacin (SLO-NIACIN) 500 MG tablet Take 1,000 mg by mouth at bedtime.     [provider]  nitroGLYCERIN (NITROSTAT) 0.4 MG SL tablet Place 1 tablet (0.4 mg total) under the tongue every 5 (five) minutes as needed for chest pain ((MAX of 3 doses)). 05/06/16   Dorothy Spark, MD  traZODone (DESYREL) 50 MG tablet TAKE ONE-HALF TO ONE TABLET BY MOUTH AT BEDTIME AS NEEDED FOR SLEEP 10/10/16   [name], MD    Allergies Patient has no known allergies.  Family History  Problem Relation Age of Onset  . Diabetes Sister   . Stomach cancer Sister 35  . Pneumonia Father        caused death  . Other Brother        no communication with brother so unsure of any health conditions  . Coronary artery disease Son 97       5v CABG and stents  . Hyperlipidemia Sister   . Stroke Neg Hx   . Heart attack Neg Hx     Social History Social History   Tobacco Use  . Smoking status: Former Smoker    Last attempt to quit: 03/04/1978    Years since  quitting: 39.0  . Smokeless tobacco: Never Used  Substance Use Topics  . Alcohol use: No  . Drug use: No    Review of Systems  Constitutional: Sweats Eyes: No visual changes. ENT: No sore throat. Cardiovascular: Denies chest pain. Respiratory: Denies shortness of breath. Gastrointestinal: No abdominal pain.  No nausea, no vomiting.  No diarrhea.  No constipation. Genitourinary: Negative for dysuria. Musculoskeletal: Negative for back pain. Skin: Negative for rash. Neurological: Lightheaded, dizzy   ____________________________________________   PHYSICAL EXAM:  VITAL SIGNS:  ED Triage Vitals  Enc Vitals Group     BP 03/05/17 0010 (!) 171/92     Pulse Rate 03/05/17 0010 86     Resp 03/05/17 0010 20     Temp 03/05/17 0010 (!) 97.5 F (36.4 C)     Temp Source 03/05/17 0010 Oral     SpO2 03/05/17 0010 100 %     Weight 03/05/17 0011 160 lb (72.6 kg)     Height 03/05/17 0011 6' (1.829 m)     Head Circumference --      Peak Flow --      Pain Score 03/05/17 0010 0     Pain Loc --      Pain Edu? --      Excl. in [place]? --     Constitutional: Alert and oriented. Well appearing and in no acute distress. Eyes: Conjunctivae are normal. PERRL. EOMI. Head: Atraumatic. Nose: No congestion/rhinnorhea. Mouth/Throat: Mucous membranes are moist.  Oropharynx non-erythematous. Cardiovascular: Normal rate, regular rhythm. Grossly normal heart sounds.  Good peripheral circulation. Respiratory: Normal respiratory effort.  No retractions. Lungs CTAB. Gastrointestinal: Soft and nontender. No distention.  Musculoskeletal: No lower extremity tenderness nor edema.   Neurologic:  Normal speech and language.  Skin:  Skin is warm, dry and intact.  Psychiatric: Mood and affect are normal.   ____________________________________________   LABS (all labs ordered are listed, but only abnormal results are displayed)  Labs Reviewed  CBC - Abnormal; Notable for the following components:       Result Value   WBC 28.4 (*)    RBC 3.66 (*)    Hemoglobin 11.6 (*)    HCT 35.6 (*)    Platelets 98 (*)    All other components within normal limits  COMPREHENSIVE METABOLIC PANEL - Abnormal; Notable for the following components:   Sodium 146 (*)    Glucose, Bld 39 (*)    BUN 37 (*)    Creatinine, Ser 1.36 (*)    GFR calc non Af Amer 49 (*)    GFR calc Af Amer 57 (*)    All other components within normal limits  GLUCOSE, CAPILLARY - Abnormal; Notable for the following components:   Glucose-Capillary 42 (*)    All other components within normal limits  GLUCOSE, CAPILLARY - Abnormal; Notable for the following components:   Glucose-Capillary 103 (*)    All other components within normal limits  GLUCOSE, CAPILLARY - Abnormal; Notable for the following components:   Glucose-Capillary 56 (*)    All other components within normal limits  GLUCOSE, CAPILLARY - Abnormal; Notable for the following components:   Glucose-Capillary 62 (*)    All other components within normal limits  GLUCOSE, CAPILLARY - Abnormal; Notable for the following components:   Glucose-Capillary 59 (*)    All other components within normal limits  GLUCOSE, CAPILLARY - Abnormal; Notable for the following components:   Glucose-Capillary 119 (*)    All other components within normal limits  GLUCOSE, CAPILLARY - Abnormal; Notable for the following components:   Glucose-Capillary 132 (*)    All other components within normal limits  GLUCOSE, CAPILLARY - Abnormal; Notable for the following components:   Glucose-Capillary 157 (*)    All other components within normal limits  GLUCOSE, CAPILLARY   ____________________________________________  EKG  none ____________________________________________  RADIOLOGY  No results found.  ____________________________________________   PROCEDURES  Procedure(s) performed: please, see procedure note(s).  .Critical Care Performed by: Loney Hering, MD Authorized  by: Dahlia Client,  Eduard Roux, MD   Critical care provider statement:    Critical care time (minutes):  30   Critical care start time:  03/05/2017 12:35 AM   Critical care end time:  03/05/2017 5:15 AM   Critical care time was exclusive of:  Separately billable procedures and treating other patients   Critical care was necessary to treat or prevent imminent or life-threatening deterioration of the following conditions:  Endocrine crisis   Critical care was time spent personally by me on the following activities:  Development of treatment plan with patient or surrogate, evaluation of patient's response to treatment, examination of patient, obtaining history from patient or surrogate, ordering and performing treatments and interventions, ordering and review of laboratory studies, re-evaluation of patient's condition and review of old charts   I assumed direction of critical care for this patient from another provider in my specialty: no      Critical Care performed: Yes, see critical care note(s)  ____________________________________________   INITIAL IMPRESSION / ASSESSMENT AND PLAN / ED COURSE  As part of my medical decision making, I reviewed the following data within the electronic MEDICAL RECORD NUMBER Notes from prior ED visits and [place] Controlled Substance Database   This is a 78 year old male who comes into the hospital today after accidentally taking too much of his short acting insulin.  The patient's blood sugars when he arrived in the ED were in the 40s.  We did give the patient a dose of dextrose and we also allow the patient to eat.  We continue to monitoring the patient's blood sugar.  It went up slightly and then went back down to the 50s. We then gave the patient D5 normal saline at 150 mL's per hour.  The patient received a second dose of dextrose after another blood sugar in the 50s.  We continue to monitor the patient over 4 hours and then after his blood sugar started to increase we took him  off of the drip.  His blood sugars continued to increase so the decision was made to send the patient home.  The patient is here today for evaluation.      ____________________________________________   FINAL CLINICAL IMPRESSION(S) / ED DIAGNOSES  Final diagnoses:  Hypoglycemia     ED Discharge Orders    None       Note:  This document was prepared using Dragon voice recognition software and may include unintentional dictation errors.    Loney Hering, MD 03/05/17 [id number]    Loney Hering, MD 03/30/17 [phone]

## 2017-03-05 NOTE — ED Notes (Signed)
Pt given orange juice and [place] sandwich tray.

## 2017-03-05 NOTE — ED Notes (Signed)
Fluids stopped per verbal order. Will recheck FS in 30 mins. Patient updated to same.

## 2017-03-14 ENCOUNTER — Encounter: Payer: Self-pay | Admitting: Cardiology

## 2017-03-14 ENCOUNTER — Ambulatory Visit (INDEPENDENT_AMBULATORY_CARE_PROVIDER_SITE_OTHER): Payer: Medicare Other | Admitting: Cardiology

## 2017-03-14 VITALS — BP 144/70 | HR 50 | Ht 71.0 in | Wt 163.2 lb

## 2017-03-14 DIAGNOSIS — R002 Palpitations: Secondary | ICD-10-CM

## 2017-03-14 DIAGNOSIS — E7849 Other hyperlipidemia: Secondary | ICD-10-CM

## 2017-03-14 DIAGNOSIS — I2510 Atherosclerotic heart disease of native coronary artery without angina pectoris: Secondary | ICD-10-CM

## 2017-03-14 DIAGNOSIS — I10 Essential (primary) hypertension: Secondary | ICD-10-CM

## 2017-03-14 DIAGNOSIS — I5033 Acute on chronic diastolic (congestive) heart failure: Secondary | ICD-10-CM

## 2017-03-14 NOTE — Progress Notes (Signed)
Cardiology Office Note    Date:  03/14/2017   ID:  Scott [name], DOB 03/03/1941, MRN [id number]  PCP:  [name], MD  Cardiologist:  Dr. Meda Coffee  CC: 1 year follow up   History of Present Illness:  Scott [name] is a 77 y.o. male with a history of CAD s/p MI s/p PTCA (Dx-OM2 proximal concentric stenosis), DM with peripheral nephropathy, CKD, bradycardia and HTN who presents to clinic for evaluation of chest pain.   Scott [name] is a former patient of Dr Verl Blalock who underwent 2 cardiac caths in 1990 for chest pain and it is unclear if he underwent any PCI or stenting. He was doing well until 2013 when he was admitted for chest pain, ruled out for ACS and an exercise stress test was negative for ischemia or prior scar. His last echocardiogram showed EF of 55-60% with mild biatrial enlargement. There was no significant valvular heart disease. He was also no pericardial effusion.   He was recently seen in the ER on 10/21/15 for evaluation of chest pain. His wife had recently died and since that time he has had occasional pain over the left lateral breast that is burning in nature. He ruled out for MI and was seen by Dr. Rayann Heman in consult. He was chest pain free and so he was discharged home with plans for outpatient follow up with [facility], which has not been completed yet.   Wife passed 08/06/15 of bone cancer. He has been having a lot of stress due to this. For about the last 2 weeks or so he has been having intermittent palpitations that last for minutes. He hasn't had any in a couple weeks. When he takes his BP during these episodes his SBP will go up ~ 190. Sometimes gets chest tightness that is not a pain but "something he is just aware of" and then he sits and worries about it. He used to be able to go out and do hours of yard work with no problems but now he is feeling like won't do that anymore because he just wants to sit inside and think about his wife.   05/06/2016, this is a 3 months  follow-up the patient feels significantly better, he is engaged with housework and yardwork. He doesn't feel so depressed anymore. He denies any chest pain, shortness of breath, dizziness, palpitations. He doesn't like cooking so he basically prepares can food that contains a lot of salt and it looks occasional lower extremity edema for which he takes Lasix as needed. He has lost 4 pounds in the last year.  03/14/2017 - 1 year follow up, the patient feels well, he is working around his house, enjoys his granddaughter. He denies any chest pain or SOB. He denies any chest pain shortness of breath, no palpitations claudications falls or syncope. He has stable lower extremity edema for which he needs to take Lasix approximately 3 times a week. He didn't like using compression stockings as they left line in his calves. He'll obtain his flu shot, denies any fever or chills or cough. His compliant with his medications and tolerating them well. He was diagnosed with CLL a year ago, no therapy indicated he is just being followed.  Past Medical History:  Diagnosis Date  . Acquired hand deformity 1962   hand saw accident at work  . Anemia 10/11/2011  . Arthritis   . Bradycardia 10/10/2011  . CAD (coronary artery disease) 10/10/2011   MI s/p PTCA (Dx-OM2  proximal concentric stenosis)  . CKD (chronic kidney disease) stage 3, GFR 30-59 ml/min (HCC)    Scott [name]  . CLL (chronic lymphocytic leukemia) ([place])   . Colon polyps   . Generalized headaches    frequent  . Glaucoma    s/p laser surgery  . HLD (hyperlipidemia)   . Hypertension   . Hypothyroidism 10/10/2011  . Thrombocytopenia ([place]) 10/11/2011  . Type 2 diabetes with nephropathy [facility])    DM refresher course [facility] (04/2013)    Past Surgical History:  Procedure Laterality Date  . BACK SURGERY     cervical neck  . CATARACT EXTRACTION, BILATERAL Bilateral 2017  . COLONOSCOPY  11/2008   1 polyp, diverticulosis, rec rpt 5 yrs (Dr. Oletta Lamas, [name])  . COLONOSCOPY   06/2014   hyperplastic polyp, rpt 5 yrs ([facility])  . EYE SURGERY  2012   laser surgery for glaucoma  . PTCA  1994, 1995  . US ECHOCARDIOGRAPHY  10/2013   inferior wall hypokinesis, mild LVH, EF 50-55%, mild MR and LA dilation    Current Medications: Outpatient Medications Prior to Visit  Medication Sig Dispense Refill  . acetaminophen (TYLENOL) 500 MG tablet Take 1,000 mg by mouth 2 (two) times daily. 2 pills two times a day    . amLODipine (NORVASC) 5 MG tablet Take 1 tablet (5 mg total) by mouth daily. 90 tablet 3  . aspirin EC 81 MG tablet Take 1 tablet (81 mg total) by mouth daily. 90 tablet 3  . atorvastatin (LIPITOR) 80 MG tablet Take 1 tablet (80 mg total) by mouth daily. 90 tablet 3  . carvedilol (COREG) 6.25 MG tablet Take 1 tablet (6.25 mg total) by mouth 2 (two) times daily. 180 tablet 3  . ferrous sulfate 325 (65 FE) MG tablet Take 325 mg by mouth daily with breakfast.    . furosemide (LASIX) 40 MG tablet Take 40 mg by mouth every other day.    Marland Kitchen glucose blood (ONE TOUCH ULTRA TEST) test strip Use to check sugar twice daily and as needed Dx:E11.65 300 each 3  . insulin aspart (NOVOLOG) 100 UNIT/ML FlexPen Inject 3-7 Units into the skin as directed.    . Insulin Detemir (LEVEMIR FLEXTOUCH) 100 UNIT/ML Pen Inject 25 Units into the skin daily at 10 pm. 15 pen 3  . levothyroxine (SYNTHROID, LEVOTHROID) 50 MCG tablet TAKE ONE TABLET BY MOUTH ONCE DAILY 90 tablet 0  . lisinopril (PRINIVIL,ZESTRIL) 20 MG tablet Take 1 tablet (20 mg total) by mouth daily. 90 tablet 2  . niacin (SLO-NIACIN) 500 MG tablet Take 1,000 mg by mouth at bedtime.     . nitroGLYCERIN (NITROSTAT) 0.4 MG SL tablet Place 1 tablet (0.4 mg total) under the tongue every 5 (five) minutes as needed for chest pain ((MAX of 3 doses)). 25 tablet 0  . traZODone (DESYREL) 50 MG tablet TAKE ONE-HALF TO ONE TABLET BY MOUTH AT BEDTIME AS NEEDED FOR SLEEP 30 tablet 3  . furosemide (LASIX) 40 MG tablet Take 1 tablet (40 mg total)  by mouth daily as needed for fluid (ankle swelling). (Patient not taking: Reported on 03/14/2017) 30 tablet 3  . insulin aspart (NOVOLOG FLEXPEN) 100 UNIT/ML FlexPen Inject 5 Units into the skin daily with supper. (Patient not taking: Reported on 03/14/2017) 15 pen 3   No facility-administered medications prior to visit.      Allergies:   Patient has no known allergies.   Social History   Socioeconomic History  . Marital status: Married  Spouse name: None  . Number of children: None  . Years of education: None  . Highest education level: None  Social Needs  . Financial resource strain: None  . Food insecurity - worry: None  . Food insecurity - inability: None  . Transportation needs - medical: None  . Transportation needs - non-medical: None  Occupational History  . None  Tobacco Use  . Smoking status: Former Smoker    Last attempt to quit: 03/04/1978    Years since quitting: 39.0  . Smokeless tobacco: Never Used  Substance and Sexual Activity  . Alcohol use: No  . Drug use: No  . Sexual activity: No  Other Topics Concern  . None  Social History Narrative   Widower. Wife passed 08/2015 from cancer. 1 dog   Occupation: retired, was route Hotel manager   Edu: 11th gr   Activity: walks dog (pomeranian) 3-4x/day, yardwork, rides bicycle.   Diet: drinks diet coke, no vegetables     Family History:  The patient's family history includes Coronary artery disease (age of onset: 60) in his son; Diabetes in his sister; Hyperlipidemia in his sister; Other in his brother; Pneumonia in his father; Stomach cancer (age of onset: 84) in his sister.     ROS:   Please see the history of present illness.    ROS All other systems reviewed and are negative.   PHYSICAL EXAM:   VS:  BP (!) 144/70   Pulse (!) 50   Ht 5\' 11"  (1.803 m)   Wt 163 lb 3.2 oz (74 kg)   BMI 22.76 kg/m    GEN: Well nourished, well developed, in no acute distress  HEENT: normal  Neck: no JVD, carotid bruits, or  masses Cardiac: RRR; no murmurs, rubs, or gallops,no edema  Respiratory:  clear to auscultation bilaterally, normal work of breathing GI: soft, nontender, nondistended, + BS MS: no deformity or atrophy  Skin: warm and dry, no rash Neuro:  Alert and Oriented x 3, Strength and sensation are intact Psych: euthymic mood, full affect  Wt Readings from Last 3 Encounters:  03/14/17 163 lb 3.2 oz (74 kg)  03/05/17 160 lb (72.6 kg)  02/19/17 164 lb 8 oz (74.6 kg)      Studies/Labs Reviewed:   EKG:  EKG is done today, 03/14/2017, shows SB 50 BPM< unchanged from prior, personally reviewed.   Recent Labs: 11/05/2016: TSH 2.090 03/05/2017: ALT 18; BUN 37; Creatinine, Ser 1.36; Hemoglobin 11.6; Platelets 98; Potassium 3.6; Sodium 146   Lipid Panel    Component Value Date/Time   CHOL 126 11/05/2016 0831   CHOL 153 09/04/2011   TRIG 76 11/05/2016 0831   TRIG 78 09/04/2011   HDL 43 11/05/2016 0831   CHOLHDL 2.9 11/05/2016 0831   CHOLHDL 2.6 12/13/2015 0859   VLDL 16 12/13/2015 0859   LDLCALC 68 11/05/2016 0831   LDLDIRECT 84 09/04/2011    Additional studies/ records that were reviewed today include:  Echo 10/26/2013 - Left ventricle: Inferior wall hypokinesis. The cavity size was normal. Wall thickness was increased in a pattern of mild LVH. Systolic function was normal. The estimated ejection fraction was in the range of 50% to 55%. - Mitral valve: There was mild regurgitation. - Left atrium: The atrium was mildly dilated. - Atrial septum: No defect or patent foramen ovale was identified.  ECG: SR, old inferior MI, otherwise normal  Lexiscan nuclear stress test: 11/23/2013 Impression Exercise Capacity: Lexiscan with low level exercise. BP Response: Normal blood pressure  response. Clinical Symptoms: Anxiety, shortness of breath ECG Impression: No significant ST segment change suggestive of ischemia. Comparison with Prior Nuclear Study: No images to compare  Overall  Impression: Low risk stress nuclear study with no significant ischemia identified. Perfusion images suggestive of left ventricular hypertrophy.. LV Ejection Fraction: 58%. LV Wall Motion: NL LV Function; NL Wall Motion  Lexiscan Nuclear stress test: 11/2015  Nuclear stress EF: 58%.  There was no ST segment deviation noted during stress.  Defect 1: There is a small defect of severe severity present in the apex location.  This is a low risk study.  The left ventricular ejection fraction is normal (55-65%).   Low risk stress nuclear study with mild apical thinning and mild apical ischemia; EF 58 with normal wall motion   ASSESSMENT & PLAN:   CAD: prior intervention in 1990',  he is currently asymptomatic, Continue ASA, moderate dose atorvastatin, followed by his primary care physicians, lipids were all at goal in September 2018, continue lisinopril and carvedilol. Normal LFTs.  HTN: Controlled after we increased his amlodipine. Repeated blood pressure today 124/70 mmHg.  Hyperlipidemia: continue atorvastatin and niacin, no side effects.  Sinus bradycardia: asymptomatic, we will continue carvedilol 6.25mg  BID   Medication Adjustments/Labs and Tests Ordered: Current medicines are reviewed at length with the patient today.  Concerns regarding medicines are outlined above.  Medication changes, Labs and Tests ordered today are listed in the Patient Instructions below. There are no Patient Instructions on file for this visit.   Signed, Ena Dawley, MD  03/14/2017 9:42 AM    [address], [place], [place]  [address] Phone: [phone]; Fax: [phone]

## 2017-03-14 NOTE — Patient Instructions (Signed)

## 2017-03-16 ENCOUNTER — Other Ambulatory Visit: Payer: Self-pay | Admitting: Family Medicine

## 2017-03-21 DIAGNOSIS — Z961 Presence of intraocular lens: Secondary | ICD-10-CM | POA: Diagnosis not present

## 2017-03-21 DIAGNOSIS — E119 Type 2 diabetes mellitus without complications: Secondary | ICD-10-CM | POA: Diagnosis not present

## 2017-03-21 DIAGNOSIS — H401132 Primary open-angle glaucoma, bilateral, moderate stage: Secondary | ICD-10-CM | POA: Diagnosis not present

## 2017-03-21 DIAGNOSIS — H26491 Other secondary cataract, right eye: Secondary | ICD-10-CM | POA: Diagnosis not present

## 2017-03-21 LAB — HM DIABETES EYE EXAM

## 2017-04-05 ENCOUNTER — Emergency Department
Admission: EM | Admit: 2017-04-05 | Discharge: 2017-04-05 | Disposition: A | Discharge status: Home / Self Care | Payer: Medicare Other | Attending: Emergency Medicine | Admitting: Emergency Medicine

## 2017-04-05 ENCOUNTER — Other Ambulatory Visit: Payer: Self-pay

## 2017-04-05 ENCOUNTER — Encounter: Payer: Self-pay | Admitting: Emergency Medicine

## 2017-04-05 DIAGNOSIS — J069 Acute upper respiratory infection, unspecified: Secondary | ICD-10-CM | POA: Insufficient documentation

## 2017-04-05 DIAGNOSIS — I129 Hypertensive chronic kidney disease with stage 1 through stage 4 chronic kidney disease, or unspecified chronic kidney disease: Secondary | ICD-10-CM | POA: Diagnosis not present

## 2017-04-05 DIAGNOSIS — B349 Viral infection, unspecified: Secondary | ICD-10-CM | POA: Insufficient documentation

## 2017-04-05 DIAGNOSIS — N183 Chronic kidney disease, stage 3 (moderate): Secondary | ICD-10-CM | POA: Insufficient documentation

## 2017-04-05 DIAGNOSIS — Z794 Long term (current) use of insulin: Secondary | ICD-10-CM | POA: Insufficient documentation

## 2017-04-05 DIAGNOSIS — J3489 Other specified disorders of nose and nasal sinuses: Secondary | ICD-10-CM | POA: Diagnosis present

## 2017-04-05 DIAGNOSIS — E039 Hypothyroidism, unspecified: Secondary | ICD-10-CM | POA: Diagnosis not present

## 2017-04-05 DIAGNOSIS — E785 Hyperlipidemia, unspecified: Secondary | ICD-10-CM | POA: Diagnosis not present

## 2017-04-05 DIAGNOSIS — E1121 Type 2 diabetes mellitus with diabetic nephropathy: Secondary | ICD-10-CM | POA: Insufficient documentation

## 2017-04-05 DIAGNOSIS — I2510 Atherosclerotic heart disease of native coronary artery without angina pectoris: Secondary | ICD-10-CM | POA: Insufficient documentation

## 2017-04-05 DIAGNOSIS — Z7982 Long term (current) use of aspirin: Secondary | ICD-10-CM | POA: Diagnosis not present

## 2017-04-05 DIAGNOSIS — Z87891 Personal history of nicotine dependence: Secondary | ICD-10-CM | POA: Insufficient documentation

## 2017-04-05 DIAGNOSIS — Z79899 Other long term (current) drug therapy: Secondary | ICD-10-CM | POA: Insufficient documentation

## 2017-04-05 LAB — INFLUENZA PANEL BY PCR (TYPE A & B)
Influenza A By PCR: NEGATIVE
Influenza B By PCR: NEGATIVE

## 2017-04-05 MED ORDER — CETIRIZINE HCL 10 MG PO TABS: 10.0000 mg | ORAL_TABLET | Freq: Every day | ORAL | 0 refills | Status: DC

## 2017-04-05 MED ORDER — OSELTAMIVIR PHOSPHATE 75 MG PO CAPS: 75.0000 mg | ORAL_CAPSULE | Freq: Two times a day (BID) | ORAL | 0 refills | Status: AC

## 2017-04-05 NOTE — ED Triage Notes (Signed)
States runny nose x 3 days, states L eye irritation with clear drainage began today.

## 2017-04-05 NOTE — ED Notes (Signed)
Pt discharged to home.  Family member driving.  Discharge instructions reviewed.  Verbalized understanding.  No questions or concerns at this time.  Teach back verified.  Pt in NAD.  No items left in ED.   

## 2017-04-05 NOTE — ED Notes (Signed)
Pt states that his eye and nose feels like fire.  Pt states that he woke up a few days ago with cold symptoms.  Pt states he is taking an OTC cold medications, but just doesn't feel good.  Pt denies knowing if he has had fevers, but states that he has been having chills at times.

## 2017-04-05 NOTE — ED Provider Notes (Signed)
[facility] Emergency Department Provider Note  ____________________________________________  Time seen: Approximately 9:51 PM  I have reviewed the triage vital signs and the nursing notes.   HISTORY  Chief Complaint Eye Drainage and Nasal Congestion    HPI Scott [name] is a 77 y.o. male presents to the emergency department with copious rhinorrhea and increased tearing of the left eye that started at 3:00 PM today.  Patient reports that symptoms are interfering with his time at the [place].  He denies headache, nonproductive cough, fever, chills, nausea, vomiting, diarrhea or abdominal pain.  No prior history of seasonal allergies.  Patient denies foreign body sensation, pain with extraocular eye muscle movement, photophobia or blurry vision.  No alleviating measures of been attempted.   Past Medical History:  Diagnosis Date  . Acquired hand deformity 1962   hand saw accident at work  . Anemia 10/11/2011  . Arthritis   . Bradycardia 10/10/2011  . CAD (coronary artery disease) 10/10/2011   MI s/p PTCA (Dx-OM2 proximal concentric stenosis)  . CKD (chronic kidney disease) stage 3, GFR 30-59 ml/min (HCC)    [name]  . CLL (chronic lymphocytic leukemia) ([place])   . Colon polyps   . Generalized headaches    frequent  . Glaucoma    s/p laser surgery  . HLD (hyperlipidemia)   . Hypertension   . Hypothyroidism 10/10/2011  . Thrombocytopenia ([place]) 10/11/2011  . Type 2 diabetes with nephropathy [facility])    DM refresher course [facility] (04/2013)    Patient Active Problem List   Diagnosis Date Noted  . Type 2 diabetes with nephropathy ([place])   . Glaucoma   . Generalized headaches   . Colon polyps   . Arthritis   . CLL (chronic lymphocytic leukemia) ([place]) 01/18/2016  . Grieving 01/03/2016  . Chronic insomnia 01/03/2016  . Diabetic neuropathy ([place]) 06/09/2015  . BPV (benign positional vertigo) 05/23/2014  . Advanced care planning/counseling discussion 01/25/2014   . Chest pain 10/26/2013  . Medicare annual wellness visit, subsequent 07/06/2012  . HLD (hyperlipidemia)   . CKD (chronic kidney disease) stage 3, GFR 30-59 ml/min (HCC)   . Uncontrolled type 2 diabetes mellitus with nephropathy ([place])   . Anemia in CKD (chronic kidney disease) 10/11/2011  . Thrombocytopenia ([place]) 10/11/2011  . Anemia 10/11/2011  . CAD (coronary artery disease) 10/10/2011  . Hypertension 10/10/2011  . Hypothyroidism 10/10/2011  . Bradycardia 10/10/2011  . Acquired hand deformity 03/04/1960    Past Surgical History:  Procedure Laterality Date  . BACK SURGERY     cervical neck  . CATARACT EXTRACTION, BILATERAL Bilateral 2017  . COLONOSCOPY  11/2008   1 polyp, diverticulosis, rec rpt 5 yrs (Dr. Oletta Lamas, [name])  . COLONOSCOPY  06/2014   hyperplastic polyp, rpt 5 yrs ([facility])  . EYE SURGERY  2012   laser surgery for glaucoma  . PTCA  1994, 1995  . US ECHOCARDIOGRAPHY  10/2013   inferior wall hypokinesis, mild LVH, EF 50-55%, mild MR and LA dilation    Prior to Admission medications   Medication Sig Start Date End Date Taking? Authorizing Provider  acetaminophen (TYLENOL) 500 MG tablet Take 1,000 mg by mouth 2 (two) times daily. 2 pills two times a day    [provider]  amLODipine (NORVASC) 5 MG tablet Take 1 tablet (5 mg total) by mouth daily. 05/06/16   Dorothy Spark, MD  aspirin EC 81 MG tablet Take 1 tablet (81 mg total) by mouth daily. 02/01/16  Dorothy Spark, MD  atorvastatin (LIPITOR) 80 MG tablet Take 1 tablet (80 mg total) by mouth daily. 05/06/16   Dorothy Spark, MD  carvedilol (COREG) 6.25 MG tablet Take 1 tablet (6.25 mg total) by mouth 2 (two) times daily. 05/06/16   Dorothy Spark, MD  cetirizine (ZYRTEC ALLERGY) 10 MG tablet Take 1 tablet (10 mg total) by mouth daily for 10 days. 04/05/17 04/15/17  Lannie Fields, PA-C  ferrous sulfate 325 (65 FE) MG tablet Take 325 mg by mouth daily with breakfast.    [provider]   furosemide (LASIX) 40 MG tablet Take 40 mg by mouth every other day.    [provider]  glucose blood (ONE TOUCH ULTRA TEST) test strip USE ONE STRIP TO CHECK GLUCOSE TWICE DAILY AND  AS  NEEDED 03/17/17   [name], MD  insulin aspart (NOVOLOG) 100 UNIT/ML FlexPen Inject 3-7 Units into the skin as directed.    [provider]  Insulin Detemir (LEVEMIR FLEXTOUCH) 100 UNIT/ML Pen Inject 25 Units into the skin daily at 10 pm. 02/17/17   [name], MD  levothyroxine (SYNTHROID, LEVOTHROID) 50 MCG tablet TAKE ONE TABLET BY MOUTH ONCE DAILY 02/26/17   [name], MD  lisinopril (PRINIVIL,ZESTRIL) 20 MG tablet Take 1 tablet (20 mg total) by mouth daily. 05/06/16   Dorothy Spark, MD  niacin (SLO-NIACIN) 500 MG tablet Take 1,000 mg by mouth at bedtime.     [provider]  nitroGLYCERIN (NITROSTAT) 0.4 MG SL tablet Place 1 tablet (0.4 mg total) under the tongue every 5 (five) minutes as needed for chest pain ((MAX of 3 doses)). 05/06/16   Dorothy Spark, MD  oseltamivir (TAMIFLU) 75 MG capsule Take 1 capsule (75 mg total) by mouth 2 (two) times daily for 5 days. 04/05/17 04/10/17  Lannie Fields, PA-C  traZODone (DESYREL) 50 MG tablet TAKE ONE-HALF TO ONE TABLET BY MOUTH AT BEDTIME AS NEEDED FOR SLEEP 10/10/16   [name], MD    Allergies Patient has no known allergies.  Family History  Problem Relation Age of Onset  . Diabetes Sister   . Stomach cancer Sister 44  . Pneumonia Father        caused death  . Other Brother        no communication with brother so unsure of any health conditions  . Coronary artery disease Son 70       5v CABG and stents  . Hyperlipidemia Sister   . Stroke Neg Hx   . Heart attack Neg Hx     Social History Social History   Tobacco Use  . Smoking status: Former Smoker    Last attempt to quit: 03/04/1978    Years since quitting: 39.1  . Smokeless tobacco: Never Used  Substance Use Topics  . Alcohol use:  No  . Drug use: No     Review of Systems  Constitutional: No fever/chills Eyes: Patient has increased tearing of left eye. ENT: No upper respiratory complaints. Cardiovascular: no chest pain. Respiratory: no cough. No SOB. Gastrointestinal: No abdominal pain.  No nausea, no vomiting.  No diarrhea.  No constipation. Genitourinary: Negative for dysuria. No hematuria Musculoskeletal: Negative for musculoskeletal pain. Skin: Negative for rash, abrasions, lacerations, ecchymosis. Neurological: Negative for headaches, focal weakness or numbness.  ____________________________________________   PHYSICAL EXAM:  VITAL SIGNS: ED Triage Vitals  Enc Vitals Group     BP 04/05/17 1745 (!) 179/65     Pulse Rate  04/05/17 1745 70     Resp 04/05/17 1745 18     Temp 04/05/17 1745 98.7 F (37.1 C)     Temp Source 04/05/17 1745 Oral     SpO2 04/05/17 1745 100 %     Weight 04/05/17 1747 160 lb (72.6 kg)     Height 04/05/17 1747 5\' 11"  (1.803 m)     Head Circumference --      Peak Flow --      Pain Score 04/05/17 1747 8     Pain Loc --      Pain Edu? --      Excl. in Fate? --      Constitutional: Alert and oriented. Well appearing and in no acute distress. Eyes: Conjunctivae are normal. PERRL. EOMI. increased tearing of left eye visualized.  Patient is holding left eye open and has no pain elicited with extraocular eye muscle testing. Head: Atraumatic. ENT:      Ears: TMs are pearly bilaterally.      Nose: Copious rhinorrhea visualized.      Mouth/Throat: Mucous membranes are moist.  Neck: No stridor.  No cervical spine tenderness to palpation.  Cardiovascular: Normal rate, regular rhythm. Normal S1 and S2.  Good peripheral circulation. Respiratory: Normal respiratory effort without tachypnea or retractions. Lungs CTAB. Good air entry to the bases with no decreased or absent breath sounds. Gastrointestinal: Bowel sounds 4 quadrants. Soft and nontender to palpation. No guarding or  rigidity. No palpable masses. No distention. No CVA tenderness. Musculoskeletal: Full range of motion to all extremities. No gross deformities appreciated. Neurologic:  Normal speech and language. No gross focal neurologic deficits are appreciated.  Skin:  Skin is warm, dry and intact. No rash noted. Psychiatric: Mood and affect are normal. Speech and behavior are normal. Patient exhibits appropriate insight and judgement.   ____________________________________________   LABS (all labs ordered are listed, but only abnormal results are displayed)  Labs Reviewed  INFLUENZA PANEL BY PCR (TYPE A & B)   ____________________________________________  EKG   ____________________________________________  RADIOLOGY   No results found.  ____________________________________________    PROCEDURES  Procedure(s) performed:    Procedures    Medications - No data to display   ____________________________________________   INITIAL IMPRESSION / ASSESSMENT AND PLAN / ED COURSE  Pertinent labs & imaging results that were available during my care of the patient were reviewed by me and considered in my medical decision making (see chart for details).  Review of the [place] CSRS was performed in accordance of the [place] prior to dispensing any controlled drugs.    Assessment and plan Upper respiratory tract infection Patient presents to the emergency department with copious rhinorrhea and increased tearing of the left eye.  Original differential diagnosis included influenza, unspecified viral URI and seasonal allergies.  As patient does not have a history of seasonal, I have increase suspicion for viral process.  Patient tested negative for influenza in the emergency department.  He was discharged with Tamiflu and advised to only start aforementioned medication if he were to experience additional symptoms such as headache, congestion, pharyngitis, nausea, vomiting and diarrhea.  Patient voiced  understanding.  Patient was also discharged with Zyrtec.  Vital signs remained reassuring throughout emergency department course.  Further workup is not necessary at this time.  All patient questions were answered.   ____________________________________________  FINAL CLINICAL IMPRESSION(S) / ED DIAGNOSES  Final diagnoses:  Viral upper respiratory tract infection      NEW MEDICATIONS STARTED DURING  THIS VISIT:  ED Discharge Orders        Ordered    oseltamivir (TAMIFLU) 75 MG capsule  2 times daily     04/05/17 2010    cetirizine (ZYRTEC ALLERGY) 10 MG tablet  Daily     04/05/17 2010          This chart was dictated using voice recognition software/Dragon. Despite best efforts to proofread, errors can occur which can change the meaning. Any change was purely unintentional.    Lannie Fields, PA-C 04/05/17 2156    Earleen Newport, MD 04/05/17 2221

## 2017-04-08 ENCOUNTER — Telehealth: Payer: Self-pay

## 2017-04-08 ENCOUNTER — Encounter: Payer: Self-pay | Admitting: Family Medicine

## 2017-04-08 MED ORDER — GLUCOSE BLOOD VI STRP: ORAL_STRIP | 3 refills | Status: DC

## 2017-04-08 NOTE — Telephone Encounter (Signed)
Spoke with Theadora Rama at [facility] asking about rx for test strips sent 03/17/17.   Says they need dx code on a new rx.  Edited rx to include dx code: E11.21. Sent new rx to pharmacy. Notified pt via [place].

## 2017-04-14 ENCOUNTER — Encounter: Payer: Self-pay | Admitting: Family Medicine

## 2017-04-20 NOTE — Progress Notes (Signed)
[place]  [phone]) [phone] [phone]) [phone]  ID: [name] OB: [birth year]  MR#: [id number]  ZDG#:[id number]  Patient Care Team: [name], MD as PCP - General (Family Medicine) Lloyd Huger, MD as Consulting Physician (Oncology) Clent Jacks, MD as Consulting Physician (Ophthalmology) Fleet Contras, MD as Consulting Physician (Nephrology) Dorothy Spark, MD as Consulting Physician (Cardiology)  CHIEF COMPLAINT: CLL.  INTERVAL HISTORY: Patient returns to clinic today for repeat laboratory work and further evaluation. He recently had cold and congestion, but denies any fevers and states he is feeling better and back to his baseline.  Influenza testing was negative.  He otherwise has felt well and has been asymptomatic. He has no neurologic complaints.  He has a good appetite and denies weight loss. He denies any night sweats. He has noted no lymphadenopathy. He has no chest pain or shortness of breath. He denies any nausea, vomiting, constipation, or diarrhea. He has no urinary complaints. Patient offers no specific complaints today.  REVIEW OF SYSTEMS:   Review of Systems  Constitutional: Negative.  Negative for fever, malaise/fatigue and weight loss.  Respiratory: Negative.  Negative for cough and shortness of breath.   Cardiovascular: Negative.  Negative for chest pain and leg swelling.  Gastrointestinal: Negative.  Negative for abdominal pain.  Genitourinary: Negative.  Negative for dysuria.  Musculoskeletal: Negative.   Skin: Negative.  Negative for rash.  Neurological: Negative.  Negative for weakness.  Psychiatric/Behavioral: The patient is nervous/anxious.     As per HPI. Otherwise, a complete review of systems is negative.  PAST MEDICAL HISTORY: Past Medical History:  Diagnosis Date  . Acquired hand deformity 1962   hand saw accident at work  . Anemia 10/11/2011  . Arthritis   . Bradycardia 10/10/2011  . CAD  (coronary artery disease) 10/10/2011   MI s/p PTCA (Dx-OM2 proximal concentric stenosis)  . CKD (chronic kidney disease) stage 3, GFR 30-59 ml/min (HCC)    [name]  . CLL (chronic lymphocytic leukemia) ([place])   . Colon polyps   . Generalized headaches    frequent  . Glaucoma    s/p laser surgery  . HLD (hyperlipidemia)   . Hypertension   . Hypothyroidism 10/10/2011  . Thrombocytopenia ([place]) 10/11/2011  . Type 2 diabetes with nephropathy [facility])    DM refresher course [facility] (04/2013)    PAST SURGICAL HISTORY: Past Surgical History:  Procedure Laterality Date  . BACK SURGERY     cervical neck  . CATARACT EXTRACTION, BILATERAL Bilateral 2017  . COLONOSCOPY  11/2008   1 polyp, diverticulosis, rec rpt 5 yrs (Dr. Oletta Lamas, [name])  . COLONOSCOPY  06/2014   hyperplastic polyp, rpt 5 yrs ([place])  . EYE SURGERY  2012   laser surgery for glaucoma  . PTCA  1994, 1995  . US ECHOCARDIOGRAPHY  10/2013   inferior wall hypokinesis, mild LVH, EF 50-55%, mild MR and LA dilation    FAMILY HISTORY: Family History  Problem Relation Age of Onset  . Diabetes Sister   . Stomach cancer Sister 64  . Pneumonia Father        caused death  . Other Brother        no communication with brother so unsure of any health conditions  . Coronary artery disease Son 22       5v CABG and stents  . Hyperlipidemia Sister   . Stroke Neg Hx   . Heart attack Neg Hx     ADVANCED DIRECTIVES (Y/N):  N  HEALTH MAINTENANCE: Social History   Tobacco Use  . Smoking status: Former Smoker    Last attempt to quit: 03/04/1978    Years since quitting: 39.1  . Smokeless tobacco: Never Used  Substance Use Topics  . Alcohol use: No  . Drug use: No     Colonoscopy:  PAP:  Bone density:  Lipid panel:  No Known Allergies  Current Outpatient Medications  Medication Sig Dispense Refill  . acetaminophen (TYLENOL) 500 MG tablet Take 1,000 mg by mouth 2 (two) times daily. 2 pills two times a day    . amLODipine (NORVASC)  5 MG tablet Take 1 tablet (5 mg total) by mouth daily. 90 tablet 3  . aspirin EC 81 MG tablet Take 1 tablet (81 mg total) by mouth daily. 90 tablet 3  . atorvastatin (LIPITOR) 80 MG tablet Take 1 tablet (80 mg total) by mouth daily. 90 tablet 3  . carvedilol (COREG) 6.25 MG tablet Take 1 tablet (6.25 mg total) by mouth 2 (two) times daily. 180 tablet 3  . ferrous sulfate 325 (65 FE) MG tablet Take 325 mg by mouth daily with breakfast.    . furosemide (LASIX) 40 MG tablet Take 40 mg by mouth every other day.    Marland Kitchen glucose blood (ONE TOUCH ULTRA TEST) test strip USE ONE STRIP TO CHECK GLUCOSE TWICE DAILY AND  AS  NEEDED. Dx code:  E11.21 150 each 3  . insulin aspart (NOVOLOG) 100 UNIT/ML FlexPen Inject 3-7 Units into the skin as directed.    . Insulin Detemir (LEVEMIR FLEXTOUCH) 100 UNIT/ML Pen Inject 25 Units into the skin daily at 10 pm. 15 pen 3  . levothyroxine (SYNTHROID, LEVOTHROID) 50 MCG tablet TAKE ONE TABLET BY MOUTH ONCE DAILY 90 tablet 0  . lisinopril (PRINIVIL,ZESTRIL) 20 MG tablet Take 1 tablet (20 mg total) by mouth daily. 90 tablet 2  . niacin (SLO-NIACIN) 500 MG tablet Take 1,000 mg by mouth at bedtime.     . nitroGLYCERIN (NITROSTAT) 0.4 MG SL tablet Place 1 tablet (0.4 mg total) under the tongue every 5 (five) minutes as needed for chest pain ((MAX of 3 doses)). 25 tablet 0  . traZODone (DESYREL) 50 MG tablet TAKE ONE-HALF TO ONE TABLET BY MOUTH AT BEDTIME AS NEEDED FOR SLEEP 30 tablet 3  . cetirizine (ZYRTEC ALLERGY) 10 MG tablet Take 1 tablet (10 mg total) by mouth daily for 10 days. 20 tablet 0   No current facility-administered medications for this visit.     OBJECTIVE: Vitals:   04/21/17 1404  BP: (!) 145/76  Pulse: (!) 56  Resp: 18  Temp: 98.3 F (36.8 C)     Body mass index is 22.21 kg/m.    ECOG FS:0 - Asymptomatic  General: Well-developed, well-nourished, no acute distress. Eyes: Pink conjunctiva, anicteric sclera. Lungs: Clear to auscultation  bilaterally. Heart: Regular rate and rhythm. No rubs, murmurs, or gallops. Abdomen: Soft, nontender, nondistended. No organomegaly noted, normoactive bowel sounds. Musculoskeletal: No edema, cyanosis, or clubbing. Neuro: Alert, answering all questions appropriately. Cranial nerves grossly intact. Skin: No rashes or petechiae noted. Psych: Normal affect.  LAB RESULTS:  Lab Results  Component Value Date   NA 146 (H) 03/05/2017   K 3.6 03/05/2017   CL 111 03/05/2017   CO2 25 03/05/2017   GLUCOSE 39 (LL) 03/05/2017   BUN 37 (H) 03/05/2017   CREATININE 1.36 (H) 03/05/2017   CALCIUM 10.2 03/05/2017   PROT 7.6 03/05/2017   ALBUMIN 4.8 03/05/2017  AST 33 03/05/2017   ALT 18 03/05/2017   ALKPHOS 66 03/05/2017   BILITOT 1.0 03/05/2017   GFRNONAA 49 (L) 03/05/2017   GFRAA 57 (L) 03/05/2017    Lab Results  Component Value Date   WBC 33.9 (H) 04/21/2017   NEUTROABS 3.6 04/21/2017   HGB 9.2 (L) 04/21/2017   HCT 28.0 (L) 04/21/2017   MCV 100.6 (H) 04/21/2017   PLT 104 (L) 04/21/2017     STUDIES: No results found.  ASSESSMENT: CLL, Rai stage 0.  PLAN:    1. CLL: Confirmed by peripheral blood flow cytometry.  Patient's white blood cell count has trended up over the past 6 months.  He is also more anemic than previous.  His platelet count is decreased but stable.  No intervention is needed at this time, but if his white count continues to increase would consider treatment with weekly Rituxan. Can consider imaging with CT scan in the future to assess for lymphadenopathy, but this is not necessary at this time. Patient does not require bone marrow biopsy. Return to clinic in 3 months with repeat laboratory work only and then in 6 months for laboratory work and further evaluation. 2. Thrombocytopenia: Likely secondary to CLL. Patient's platelet count is greater than 100 therefore he continues to be stage 0. Continue to monitor.  Approximately 20 minutes was spent in discussion of which  greater than 50% was consultation.  Patient expressed understanding and was in agreement with this plan. He also understands that He can call clinic at any time with any questions, concerns, or complaints.    Lloyd Huger, MD   04/21/2017 2:09 PM

## 2017-04-21 ENCOUNTER — Inpatient Hospital Stay (HOSPITAL_BASED_OUTPATIENT_CLINIC_OR_DEPARTMENT_OTHER): Payer: Medicare Other | Admitting: Oncology

## 2017-04-21 ENCOUNTER — Inpatient Hospital Stay: Payer: Medicare Other | Attending: Oncology

## 2017-04-21 VITALS — BP 145/76 | HR 56 | Temp 98.3°F | Resp 18 | Wt 159.2 lb

## 2017-04-21 DIAGNOSIS — D696 Thrombocytopenia, unspecified: Secondary | ICD-10-CM | POA: Diagnosis not present

## 2017-04-21 DIAGNOSIS — N183 Chronic kidney disease, stage 3 (moderate): Secondary | ICD-10-CM | POA: Diagnosis not present

## 2017-04-21 DIAGNOSIS — Z87891 Personal history of nicotine dependence: Secondary | ICD-10-CM | POA: Diagnosis not present

## 2017-04-21 DIAGNOSIS — C9110 Chronic lymphocytic leukemia of B-cell type not having achieved remission: Secondary | ICD-10-CM | POA: Insufficient documentation

## 2017-04-21 DIAGNOSIS — D649 Anemia, unspecified: Secondary | ICD-10-CM | POA: Insufficient documentation

## 2017-04-21 DIAGNOSIS — I2510 Atherosclerotic heart disease of native coronary artery without angina pectoris: Secondary | ICD-10-CM

## 2017-04-21 LAB — CBC WITH DIFFERENTIAL/PLATELET
Basophils Absolute: 0 10*3/uL (ref 0–0.1)
Basophils Relative: 0 %
EOS ABS: 0.2 10*3/uL (ref 0–0.7)
Eosinophils Relative: 1 %
HCT: 28 % — ABNORMAL LOW (ref 40.0–52.0)
HEMOGLOBIN: 9.2 g/dL — AB (ref 13.0–18.0)
Lymphocytes Relative: 86 %
Lymphs Abs: 29.4 10*3/uL — ABNORMAL HIGH (ref 1.0–3.6)
MCH: 33.2 pg (ref 26.0–34.0)
MCHC: 33 g/dL (ref 32.0–36.0)
MCV: 100.6 fL — ABNORMAL HIGH (ref 80.0–100.0)
MONOS PCT: 2 %
Monocytes Absolute: 0.6 10*3/uL (ref 0.2–1.0)
NEUTROS PCT: 11 %
Neutro Abs: 3.6 10*3/uL (ref 1.4–6.5)
PLATELETS: 104 10*3/uL — AB (ref 150–440)
RBC: 2.78 MIL/uL — AB (ref 4.40–5.90)
RDW: 14.2 % (ref 11.5–14.5)
WBC: 33.9 10*3/uL — AB (ref 3.8–10.6)

## 2017-04-21 NOTE — Progress Notes (Signed)
Pt in today for follow up denies any difficulties or concerns at this time

## 2017-04-29 DIAGNOSIS — I129 Hypertensive chronic kidney disease with stage 1 through stage 4 chronic kidney disease, or unspecified chronic kidney disease: Secondary | ICD-10-CM | POA: Diagnosis not present

## 2017-04-29 DIAGNOSIS — N183 Chronic kidney disease, stage 3 (moderate): Secondary | ICD-10-CM | POA: Diagnosis not present

## 2017-04-29 DIAGNOSIS — E1122 Type 2 diabetes mellitus with diabetic chronic kidney disease: Secondary | ICD-10-CM | POA: Diagnosis not present

## 2017-04-29 DIAGNOSIS — C9190 Lymphoid leukemia, unspecified not having achieved remission: Secondary | ICD-10-CM | POA: Diagnosis not present

## 2017-05-08 ENCOUNTER — Other Ambulatory Visit: Payer: Self-pay | Admitting: Cardiology

## 2017-05-08 DIAGNOSIS — E785 Hyperlipidemia, unspecified: Secondary | ICD-10-CM

## 2017-05-08 DIAGNOSIS — E7849 Other hyperlipidemia: Secondary | ICD-10-CM

## 2017-05-08 DIAGNOSIS — I2510 Atherosclerotic heart disease of native coronary artery without angina pectoris: Secondary | ICD-10-CM

## 2017-05-08 DIAGNOSIS — I10 Essential (primary) hypertension: Secondary | ICD-10-CM

## 2017-05-14 ENCOUNTER — Other Ambulatory Visit: Payer: Self-pay | Admitting: Family Medicine

## 2017-05-14 DIAGNOSIS — E1165 Type 2 diabetes mellitus with hyperglycemia: Secondary | ICD-10-CM

## 2017-05-14 DIAGNOSIS — IMO0002 Reserved for concepts with insufficient information to code with codable children: Secondary | ICD-10-CM

## 2017-05-14 DIAGNOSIS — E1121 Type 2 diabetes mellitus with diabetic nephropathy: Secondary | ICD-10-CM

## 2017-05-15 ENCOUNTER — Other Ambulatory Visit (INDEPENDENT_AMBULATORY_CARE_PROVIDER_SITE_OTHER): Payer: Medicare Other

## 2017-05-15 DIAGNOSIS — E1121 Type 2 diabetes mellitus with diabetic nephropathy: Secondary | ICD-10-CM

## 2017-05-15 DIAGNOSIS — IMO0002 Reserved for concepts with insufficient information to code with codable children: Secondary | ICD-10-CM

## 2017-05-15 DIAGNOSIS — E1165 Type 2 diabetes mellitus with hyperglycemia: Secondary | ICD-10-CM

## 2017-05-15 LAB — HEMOGLOBIN A1C: HEMOGLOBIN A1C: 6.9 % — AB (ref 4.6–6.5)

## 2017-05-15 LAB — RENAL FUNCTION PANEL
Albumin: 4.2 g/dL (ref 3.5–5.2)
BUN: 34 mg/dL — ABNORMAL HIGH (ref 6–23)
CHLORIDE: 111 meq/L (ref 96–112)
CO2: 26 meq/L (ref 19–32)
Calcium: 9.8 mg/dL (ref 8.4–10.5)
Creatinine, Ser: 1.59 mg/dL — ABNORMAL HIGH (ref 0.40–1.50)
GFR: 45.1 mL/min — AB (ref >60.00)
Glucose, Bld: 100 mg/dL — ABNORMAL HIGH (ref 70–99)
PHOSPHORUS: 3.6 mg/dL (ref 2.3–4.6)
POTASSIUM: 4 meq/L (ref 3.5–5.1)
SODIUM: 145 meq/L (ref 135–145)

## 2017-05-19 ENCOUNTER — Encounter: Payer: Self-pay | Admitting: Family Medicine

## 2017-05-19 ENCOUNTER — Ambulatory Visit (INDEPENDENT_AMBULATORY_CARE_PROVIDER_SITE_OTHER): Payer: Medicare Other | Admitting: Family Medicine

## 2017-05-19 VITALS — BP 140/78 | HR 50 | Temp 97.9°F | Wt 166.0 lb

## 2017-05-19 DIAGNOSIS — C9110 Chronic lymphocytic leukemia of B-cell type not having achieved remission: Secondary | ICD-10-CM

## 2017-05-19 DIAGNOSIS — IMO0002 Reserved for concepts with insufficient information to code with codable children: Secondary | ICD-10-CM

## 2017-05-19 DIAGNOSIS — M25561 Pain in right knee: Secondary | ICD-10-CM | POA: Diagnosis not present

## 2017-05-19 DIAGNOSIS — E1165 Type 2 diabetes mellitus with hyperglycemia: Secondary | ICD-10-CM | POA: Diagnosis not present

## 2017-05-19 DIAGNOSIS — N183 Chronic kidney disease, stage 3 unspecified: Secondary | ICD-10-CM

## 2017-05-19 DIAGNOSIS — G8929 Other chronic pain: Secondary | ICD-10-CM

## 2017-05-19 DIAGNOSIS — E1121 Type 2 diabetes mellitus with diabetic nephropathy: Secondary | ICD-10-CM | POA: Diagnosis not present

## 2017-05-19 DIAGNOSIS — I10 Essential (primary) hypertension: Secondary | ICD-10-CM | POA: Diagnosis not present

## 2017-05-19 DIAGNOSIS — C9190 Lymphoid leukemia, unspecified not having achieved remission: Secondary | ICD-10-CM

## 2017-05-19 DIAGNOSIS — I2510 Atherosclerotic heart disease of native coronary artery without angina pectoris: Secondary | ICD-10-CM | POA: Diagnosis not present

## 2017-05-19 NOTE — Assessment & Plan Note (Signed)
Appreciate onc care.  

## 2017-05-19 NOTE — Assessment & Plan Note (Signed)
On voltaren gel.

## 2017-05-19 NOTE — Patient Instructions (Addendum)
No changes today.  Jot down on sugar log whether evening checks are before a meal or 2 hours after a meal.  Return in 3-4 months for diabetes follow up visit.  Good to see you today, call us with questions.

## 2017-05-19 NOTE — Progress Notes (Signed)
BP 140/78 (BP Location: Left Arm, Patient Position: Sitting, Cuff Size: Normal)   Pulse (!) 50   Temp 97.9 F (36.6 C) (Oral)   Wt 166 lb (75.3 kg)   SpO2 99%   BMI 23.15 kg/m    CC: 3 mo f/u visit Subjective:    Patient ID: Scott [name], male    DOB: 03-Nov-1940, 77 y.o.   MRN: [id number]  HPI: Scott [name] is a 77 y.o. male presenting on 05/19/2017 for 3 mo follow-up (Provided record of recent BS and BP readings.)   He's been using voltaren gel for his R knee pain - this has helped.   BP log reviewed: 115-150/50-70s, HR 50-60s CBG log reviewed: fasting 80-120, PM readings 200s (some isolated 296, 397, 444) none <70.  DM - does regularly check sugars: see above - at least twice daily. Compliant with antihyperglycemic regimen which includes: levemir 25u nightly, novolog sliding scale 5 units with meals (supper is sometimes meal, sometimes snack). Few low sugars or hypoglycemic symptoms - one episode needing ER eval 03/2017 (accidental insulin administration). Denies paresthesias. Last diabetic eye exam 03/2017. Pneumovax: 03/2011. Prevnar: 2015. Glucometer brand: one-touch. DSME: refresher course 2015 - declines repeat. New nephrologist.  Lab Results  Component Value Date   HGBA1C 6.9 (H) 05/15/2017   Diabetic Foot Exam - Simple   No data filed     Lab Results  Component Value Date   MICROALBUR 2.3 (H) 02/01/2015     CLL - followed by Dr [name], stable.   Lab Results  Component Value Date   TSH 2.090 11/05/2016    Relevant past medical, surgical, family and social history reviewed and updated as indicated. Interim medical history since our last visit reviewed. Allergies and medications reviewed and updated. Outpatient Medications Prior to Visit  Medication Sig Dispense Refill  . acetaminophen (TYLENOL) 650 MG CR tablet Take 650 mg by mouth every 8 (eight) hours as needed for pain.    Marland Kitchen amLODipine (NORVASC) 5 MG tablet Take 1 tablet (5 mg total) by mouth daily. 90  tablet 3  . aspirin EC 81 MG tablet Take 1 tablet (81 mg total) by mouth daily. 90 tablet 3  . atorvastatin (LIPITOR) 80 MG tablet Take 1 tablet (80 mg total) by mouth daily. 90 tablet 3  . carvedilol (COREG) 6.25 MG tablet TAKE 1 TABLET BY MOUTH TWICE DAILY 180 tablet 3  . diclofenac sodium (VOLTAREN) 1 % GEL Apply topically 4 (four) times daily.    . ferrous sulfate 325 (65 FE) MG tablet Take 325 mg by mouth daily with breakfast.    . furosemide (LASIX) 40 MG tablet Take 40 mg by mouth every other day.    Marland Kitchen glucose blood (ONE TOUCH ULTRA TEST) test strip USE ONE STRIP TO CHECK GLUCOSE TWICE DAILY AND  AS  NEEDED. Dx code:  E11.21 150 each 3  . insulin aspart (NOVOLOG) 100 UNIT/ML FlexPen Inject 3-7 Units into the skin as directed.    . Insulin Detemir (LEVEMIR FLEXTOUCH) 100 UNIT/ML Pen Inject 25 Units into the skin daily at 10 pm. 15 pen 3  . levothyroxine (SYNTHROID, LEVOTHROID) 50 MCG tablet TAKE ONE TABLET BY MOUTH ONCE DAILY 90 tablet 0  . lisinopril (PRINIVIL,ZESTRIL) 20 MG tablet Take 1 tablet (20 mg total) by mouth daily. 90 tablet 2  . niacin (SLO-NIACIN) 500 MG tablet Take 1,000 mg by mouth at bedtime.     . nitroGLYCERIN (NITROSTAT) 0.4 MG SL tablet Place 1 tablet (  0.4 mg total) under the tongue every 5 (five) minutes as needed for chest pain ((MAX of 3 doses)). 25 tablet 0  . traZODone (DESYREL) 50 MG tablet TAKE ONE-HALF TO ONE TABLET BY MOUTH AT BEDTIME AS NEEDED FOR SLEEP 30 tablet 3  . acetaminophen (TYLENOL) 500 MG tablet Take 1,000 mg by mouth 2 (two) times daily. 2 pills two times a day    . cetirizine (ZYRTEC ALLERGY) 10 MG tablet Take 1 tablet (10 mg total) by mouth daily for 10 days. 20 tablet 0   No facility-administered medications prior to visit.      Per HPI unless specifically indicated in ROS section below Review of Systems     Objective:    BP 140/78 (BP Location: Left Arm, Patient Position: Sitting, Cuff Size: Normal)   Pulse (!) 50   Temp 97.9 F (36.6  C) (Oral)   Wt 166 lb (75.3 kg)   SpO2 99%   BMI 23.15 kg/m   Wt Readings from Last 3 Encounters:  05/19/17 166 lb (75.3 kg)  04/21/17 159 lb 4 oz (72.2 kg)  04/05/17 160 lb (72.6 kg)    Physical Exam  Constitutional: He appears well-developed and well-nourished. No distress.  HENT:  Head: Normocephalic and atraumatic.  Right Ear: External ear normal.  Left Ear: External ear normal.  Nose: Nose normal.  Mouth/Throat: Oropharynx is clear and moist. No oropharyngeal exudate.  Eyes: Conjunctivae and EOM are normal. Pupils are equal, round, and reactive to light. No scleral icterus.  Neck: Normal range of motion. Neck supple.  Cardiovascular: Normal rate, regular rhythm, normal heart sounds and intact distal pulses.  No murmur heard. Pulmonary/Chest: Effort normal and breath sounds normal. No respiratory distress. He has no wheezes. He has no rales.  Musculoskeletal: He exhibits edema (tr).  See HPI for foot exam if done  Lymphadenopathy:    He has no cervical adenopathy.  Skin: Skin is warm and dry. No rash noted.  Psychiatric: He has a normal mood and affect.  Nursing note and vitals reviewed.  Results for orders placed or performed in visit on 05/15/17  Renal function panel  Result Value Ref Range   Sodium 145 135 - 145 mEq/L   Potassium 4.0 3.5 - 5.1 mEq/L   Chloride 111 96 - 112 mEq/L   CO2 26 19 - 32 mEq/L   Calcium 9.8 8.4 - 10.5 mg/dL   Albumin 4.2 3.5 - 5.2 g/dL   BUN 34 (H) 6 - 23 mg/dL   Creatinine, Ser 1.59 (H) 0.40 - 1.50 mg/dL   Glucose, Bld 100 (H) 70 - 99 mg/dL   Phosphorus 3.6 2.3 - 4.6 mg/dL   GFR 45.10 (L) >60.00 mL/min  Hemoglobin A1c  Result Value Ref Range   Hgb A1c MFr Bld 6.9 (H) 4.6 - 6.5 %      Assessment & Plan:   Problem List Items Addressed This Visit    CKD (chronic kidney disease) stage 3, GFR 30-59 ml/min (HCC)    Has established with new nephrologist. Continue to monitor this.       CLL (chronic lymphocytic leukemia) ([place])     Appreciate onc care.       Relevant Medications   acetaminophen (TYLENOL) 650 MG CR tablet   Hypertension    Chronic, suboptimal control. Continue current regimen. Ideal goal <130/80 if tolerated.       Right knee pain    On voltaren gel.       Uncontrolled type 2  diabetes mellitus with nephropathy ([place]) - Primary    Chronic. Continue current meds. Some high readings in evenings. Consider splitting levemir. Continue current regimen at this time.  He is using novolog 5 units TID with meals          No orders of the defined types were placed in this encounter.  No orders of the defined types were placed in this encounter.   Follow up plan: Return in about 3 months (around 08/19/2017) for follow up visit.  [name], MD

## 2017-05-19 NOTE — Assessment & Plan Note (Signed)
Chronic, suboptimal control. Continue current regimen. Ideal goal <130/80 if tolerated.

## 2017-05-19 NOTE — Assessment & Plan Note (Signed)
Has established with new nephrologist. Continue to monitor this.

## 2017-05-19 NOTE — Assessment & Plan Note (Addendum)
Chronic. Continue current meds. Some high readings in evenings. Consider splitting levemir. Continue current regimen at this time.  He is using novolog 5 units TID with meals

## 2017-05-27 ENCOUNTER — Other Ambulatory Visit: Payer: Self-pay | Admitting: Family Medicine

## 2017-05-27 DIAGNOSIS — E02 Subclinical iodine-deficiency hypothyroidism: Secondary | ICD-10-CM

## 2017-06-18 ENCOUNTER — Other Ambulatory Visit: Payer: Self-pay | Admitting: Family Medicine

## 2017-06-24 ENCOUNTER — Ambulatory Visit (INDEPENDENT_AMBULATORY_CARE_PROVIDER_SITE_OTHER): Payer: Medicare Other | Admitting: Internal Medicine

## 2017-06-24 ENCOUNTER — Encounter: Payer: Self-pay | Admitting: Internal Medicine

## 2017-06-24 VITALS — BP 118/60 | HR 99 | Wt 164.0 lb

## 2017-06-24 DIAGNOSIS — I952 Hypotension due to drugs: Secondary | ICD-10-CM

## 2017-06-24 DIAGNOSIS — I2510 Atherosclerotic heart disease of native coronary artery without angina pectoris: Secondary | ICD-10-CM | POA: Diagnosis not present

## 2017-06-24 MED ORDER — LISINOPRIL 10 MG PO TABS: 10.0000 mg | ORAL_TABLET | Freq: Every day | ORAL | 0 refills | Status: DC

## 2017-06-24 MED ORDER — LISINOPRIL 20 MG PO TABS: 20.0000 mg | ORAL_TABLET | Freq: Every day | ORAL | 3 refills | Status: DC

## 2017-06-24 NOTE — Patient Instructions (Signed)
Hypotension As your heart beats, it forces blood through your body. This force is called blood pressure. If you have hypotension, you have low blood pressure. When your blood pressure is too low, you may not get enough blood to your brain. You may feel weak, feel light-headed, have a fast heartbeat, or even pass out (faint). Follow these instructions at home: Eating and drinking  Drink enough fluids to keep your pee (urine) clear or pale yellow.  Eat a healthy diet, and follow instructions from your doctor about eating or drinking restrictions. A healthy diet includes: ? Fresh fruits and vegetables. ? Whole grains. ? Low-fat (lean) meats. ? Low-fat dairy products.  Eat extra salt only as told. Do not add extra salt to your diet unless your doctor tells you to.  Eat small meals often.  Avoid standing up quickly after you eat. Medicines  Take over-the-counter and prescription medicines only as told by your doctor. ? Follow instructions from your doctor about changing how much you take (the dosage) of your medicines, if this applies. ? Do not stop or change your medicine on your own. General instructions  Wear compression stockings as told by your doctor.  Get up slowly from lying down or sitting.  Avoid hot showers and a lot of heat as told by your doctor.  Return to your normal activities as told by your doctor. Ask what activities are safe for you.  Do not use any products that contain nicotine or tobacco, such as cigarettes and e-cigarettes. If you need help quitting, ask your doctor.  Keep all follow-up visits as told by your doctor. This is important. Contact a doctor if:  You throw up (vomit).  You have watery poop (diarrhea).  You have a fever for more than 2-3 days.  You feel more thirsty than normal.  You feel weak and tired. Get help right away if:  You have chest pain.  You have a fast or irregular heartbeat.  You lose feeling (get numbness) in any part  of your body.  You cannot move your arms or your legs.  You have trouble talking.  You get sweaty or feel light-headed.  You faint.  You have trouble breathing.  You have trouble staying awake.  You feel confused. This information is not intended to replace advice given to you by your health care provider. Make sure you discuss any questions you have with your health care provider. Document Released: 05/15/2009 Document Revised: 11/07/2015 Document Reviewed: 11/07/2015 Elsevier Interactive Patient Education  2017 [employer]   

## 2017-06-24 NOTE — Progress Notes (Signed)
Subjective:    Patient ID: Scott [name], male    DOB: 03/07/1940, 77 y.o.   MRN: [id number]  HPI  Pt presents to the clinic today with c/o low blood pressure. He has noticed this over this over the last 2 weeks. He reports his blood pressure has been as low as 113/58 and as high as 140/85. He does feel lightheaded, especially with position changes, but denies chest pain, shortness of breath or syncope. He has a history of CAD, CKD, HTN and DM 2 with nephropathy. His BP today is 118/60. He is taking Amlodipine, Carvedilol and Lisinopril as prescribed.  Review of Systems      Past Medical History:  Diagnosis Date  . Acquired hand deformity 1962   hand saw accident at work  . Anemia 10/11/2011  . Arthritis   . Bradycardia 10/10/2011  . CAD (coronary artery disease) 10/10/2011   MI s/p PTCA (Dx-OM2 proximal concentric stenosis)  . CKD (chronic kidney disease) stage 3, GFR 30-59 ml/min (HCC)    [name]  . CLL (chronic lymphocytic leukemia) ([place])   . Colon polyps   . Generalized headaches    frequent  . Glaucoma    s/p laser surgery  . HLD (hyperlipidemia)   . Hypertension   . Hypothyroidism 10/10/2011  . Thrombocytopenia ([place]) 10/11/2011  . Type 2 diabetes with nephropathy [facility])    DM refresher course [facility] (04/2013)    Current Outpatient Medications  Medication Sig Dispense Refill  . acetaminophen (TYLENOL) 650 MG CR tablet Take 650 mg by mouth every 8 (eight) hours as needed for pain.    Marland Kitchen amLODipine (NORVASC) 5 MG tablet Take 1 tablet (5 mg total) by mouth daily. 90 tablet 3  . aspirin EC 81 MG tablet Take 1 tablet (81 mg total) by mouth daily. 90 tablet 3  . atorvastatin (LIPITOR) 80 MG tablet Take 1 tablet (80 mg total) by mouth daily. 90 tablet 3  . carvedilol (COREG) 6.25 MG tablet TAKE 1 TABLET BY MOUTH TWICE DAILY 180 tablet 3  . diclofenac sodium (VOLTAREN) 1 % GEL Apply topically 4 (four) times daily.    . ferrous sulfate 325 (65 FE) MG tablet Take 325 mg by mouth daily  with breakfast.    . furosemide (LASIX) 40 MG tablet Take 40 mg by mouth every other day.    Marland Kitchen glucose blood (ONE TOUCH ULTRA TEST) test strip USE ONE STRIP TO CHECK GLUCOSE TWICE DAILY AND  AS  NEEDED. Dx code:  E11.21 150 each 3  . insulin aspart (NOVOLOG) 100 UNIT/ML FlexPen Inject 3-7 Units into the skin as directed.    . Insulin Detemir (LEVEMIR FLEXTOUCH) 100 UNIT/ML Pen Inject 25 Units into the skin daily at 10 pm. 15 pen 3  . levothyroxine (SYNTHROID, LEVOTHROID) 50 MCG tablet TAKE 1 TABLET BY MOUTH ONCE DAILY 90 tablet 2  . lisinopril (PRINIVIL,ZESTRIL) 20 MG tablet Take 1 tablet (20 mg total) by mouth daily. 90 tablet 2  . niacin (SLO-NIACIN) 500 MG tablet Take 1,000 mg by mouth at bedtime.     . nitroGLYCERIN (NITROSTAT) 0.4 MG SL tablet Place 1 tablet (0.4 mg total) under the tongue every 5 (five) minutes as needed for chest pain ((MAX of 3 doses)). 25 tablet 0  . traZODone (DESYREL) 50 MG tablet TAKE 1/2 TO 1 (ONE-HALF TO ONE) TABLET BY MOUTH AT BEDTIME AS NEEDED FOR SLEEP 30 tablet 2   No current facility-administered medications for this visit.  No Known Allergies  Family History  Problem Relation Age of Onset  . Diabetes Sister   . Stomach cancer Sister 22  . Pneumonia Father        caused death  . Other Brother        no communication with brother so unsure of any health conditions  . Coronary artery disease Son 56       5v CABG and stents  . Hyperlipidemia Sister   . Stroke Neg Hx   . Heart attack Neg Hx     Social History   Socioeconomic History  . Marital status: Married    Spouse name: Not on file  . Number of children: Not on file  . Years of education: Not on file  . Highest education level: Not on file  Occupational History  . Not on file  Social Needs  . Financial resource strain: Not on file  . Food insecurity:    Worry: Not on file    Inability: Not on file  . Transportation needs:    Medical: Not on file    Non-medical: Not on file    Tobacco Use  . Smoking status: Former Smoker    Last attempt to quit: 03/04/1978    Years since quitting: 39.3  . Smokeless tobacco: Never Used  Substance and Sexual Activity  . Alcohol use: No  . Drug use: No  . Sexual activity: Never  Lifestyle  . Physical activity:    Days per week: Not on file    Minutes per session: Not on file  . Stress: Not on file  Relationships  . Social connections:    Talks on phone: Not on file    Gets together: Not on file    Attends religious service: Not on file    Active member of club or organization: Not on file    Attends meetings of clubs or organizations: Not on file    Relationship status: Not on file  . Intimate partner violence:    Fear of current or ex partner: Not on file    Emotionally abused: Not on file    Physically abused: Not on file    Forced sexual activity: Not on file  Other Topics Concern  . Not on file  Social History Narrative   Widower. Wife passed 08/2015 from cancer. 1 dog   Occupation: retired, was route Hotel manager   Edu: 11th gr   Activity: walks dog (pomeranian) 3-4x/day, yardwork, rides bicycle.   Diet: drinks diet coke, no vegetables     Constitutional: Denies fever, malaise, fatigue, headache or abrupt weight changes.  Respiratory: Denies difficulty breathing, shortness of breath, cough or sputum production.   Cardiovascular: Denies chest pain, chest tightness, palpitations or swelling in the hands or feet.  Neurological: Pt reports lightheadedness. Denies difficulty with memory, difficulty with speech or problems with balance and coordination.    Objective:   Physical Exam   BP 118/60 (BP Location: Right Arm, Patient Position: Sitting, Cuff Size: Normal)   Pulse 99   Wt 164 lb (74.4 kg)   SpO2 95%   BMI 22.87 kg/m  Wt Readings from Last 3 Encounters:  06/24/17 164 lb (74.4 kg)  05/19/17 166 lb (75.3 kg)  04/21/17 159 lb 4 oz (72.2 kg)    General: Appears his stated age, in NAD. Cardiovascular:  Normal rate and rhythm. S1,S2 noted.  No murmur, rubs or gallops noted. Pulmonary/Chest: Normal effort and positive vesicular breath sounds. No respiratory distress. No  wheezes, rales or ronchi noted.  Neurological: Alert and oriented.   BMET    Component Value Date/Time   NA 145 05/15/2017 0929   NA 147 (H) 11/05/2016 0831   K 4.0 05/15/2017 0929   K 4.4 09/04/2011   CL 111 05/15/2017 0929   CO2 26 05/15/2017 0929   GLUCOSE 100 (H) 05/15/2017 0929   BUN 34 (H) 05/15/2017 0929   BUN 41 (H) 11/05/2016 0831   CREATININE 1.59 (H) 05/15/2017 0929   CREATININE 1.30 (H) 12/13/2015 0859   CALCIUM 9.8 05/15/2017 0929   GFRNONAA 49 (L) 03/05/2017 0014   GFRAA 57 (L) 03/05/2017 0014    Lipid Panel     Component Value Date/Time   CHOL 126 11/05/2016 0831   CHOL 153 09/04/2011   TRIG 76 11/05/2016 0831   TRIG 78 09/04/2011   HDL 43 11/05/2016 0831   CHOLHDL 2.9 11/05/2016 0831   CHOLHDL 2.6 12/13/2015 0859   VLDL 16 12/13/2015 0859   LDLCALC 68 11/05/2016 0831    CBC    Component Value Date/Time   WBC 33.9 (H) 04/21/2017 1326   RBC 2.78 (L) 04/21/2017 1326   HGB 9.2 (L) 04/21/2017 1326   HGB 10.7 (L) 11/05/2016 0831   HGB 12.0 03/06/2011   HCT 28.0 (L) 04/21/2017 1326   HCT 32.0 (L) 11/05/2016 0831   PLT 104 (L) 04/21/2017 1326   PLT 90 (LL) 11/05/2016 0831   MCV 100.6 (H) 04/21/2017 1326   MCV 92 11/05/2016 0831   MCH 33.2 04/21/2017 1326   MCHC 33.0 04/21/2017 1326   RDW 14.2 04/21/2017 1326   RDW 13.4 11/05/2016 0831   LYMPHSABS 29.4 (H) 04/21/2017 1326   LYMPHSABS 12.5 (H) 11/05/2016 0831   MONOABS 0.6 04/21/2017 1326   EOSABS 0.2 04/21/2017 1326   EOSABS 0.2 11/05/2016 0831   BASOSABS 0.0 04/21/2017 1326   BASOSABS 0.0 11/05/2016 0831    Hgb A1C Lab Results  Component Value Date   HGBA1C 6.9 (H) 05/15/2017           Assessment & Plan:   Hypotension due to Drug:  BP has been lower than normal for him He is symptomatic when BP is  lower Orthostatics negative Will stop Amlodipine Discussed with him why he needs the beta blocker and the ACEI as opposed to the calcium channel blocker He will continue to monitor blood pressures at home  Follow up with PCP in 1-2 weeks for BP recheck Webb Silversmith, NP

## 2017-07-01 ENCOUNTER — Ambulatory Visit (INDEPENDENT_AMBULATORY_CARE_PROVIDER_SITE_OTHER): Payer: Medicare Other | Admitting: Family Medicine

## 2017-07-01 ENCOUNTER — Encounter: Payer: Self-pay | Admitting: Family Medicine

## 2017-07-01 VITALS — BP 122/62 | HR 56 | Temp 97.8°F | Ht 70.5 in | Wt 164.0 lb

## 2017-07-01 DIAGNOSIS — E1121 Type 2 diabetes mellitus with diabetic nephropathy: Secondary | ICD-10-CM

## 2017-07-01 DIAGNOSIS — C9190 Lymphoid leukemia, unspecified not having achieved remission: Secondary | ICD-10-CM

## 2017-07-01 DIAGNOSIS — E1165 Type 2 diabetes mellitus with hyperglycemia: Secondary | ICD-10-CM

## 2017-07-01 DIAGNOSIS — IMO0002 Reserved for concepts with insufficient information to code with codable children: Secondary | ICD-10-CM

## 2017-07-01 DIAGNOSIS — I10 Essential (primary) hypertension: Secondary | ICD-10-CM | POA: Diagnosis not present

## 2017-07-01 DIAGNOSIS — I2510 Atherosclerotic heart disease of native coronary artery without angina pectoris: Secondary | ICD-10-CM

## 2017-07-01 DIAGNOSIS — C9110 Chronic lymphocytic leukemia of B-cell type not having achieved remission: Secondary | ICD-10-CM

## 2017-07-01 NOTE — Assessment & Plan Note (Signed)
Chronic, stable. Continue current regimen. Doing better off amlodipine.

## 2017-07-01 NOTE — Assessment & Plan Note (Addendum)
Monitoring trending white cells. Appreciate excellent onc care.

## 2017-07-01 NOTE — Progress Notes (Signed)
BP 122/62 (BP Location: Left Arm, Patient Position: Sitting, Cuff Size: Normal)   Pulse (!) 56   Temp 97.8 F (36.6 C) (Oral)   Ht 5' 10.5" (1.791 m)   Wt 164 lb (74.4 kg)   SpO2 99%   BMI 23.20 kg/m    CC: HTN f/u visit Subjective:    Patient ID: Scott [name], male    DOB: Dec 26, 1940, 77 y.o.   MRN: [id number]  HPI: JAROD BOZZO is a 77 y.o. male presenting on 07/01/2017 for Hypertension (1 wk follow-up.)   See prior notes for details. Seen by Rollene Fare last week with concern for fluctuating blood pressure readings with orthostatic symptoms when standing when bp low. He was taking his amlodipine, carvedilol, lisinopril and lasix. Amlodipine was discontinued. Here for f/u.   Brings log of blood pressures over the past week 100-150s/30-40s, HR 50-70s. Dizzy symptoms have improved off amlodipine. He feels he is staying well hydrated.   Brings log of sugars concerned about worsening sugar control, decreased energy noted. Log of sugars shows readings 100-300s over the last week. Last few days sugars have been more uncontrolled (hyperglycemia). Denies changes in diet or bowel.  Current insulin regimen is levemir 25u QHS, novolog 3-7 with meals (takes 5u with meals) Lab Results  Component Value Date   HGBA1C 6.9 (H) 05/15/2017     Relevant past medical, surgical, family and social history reviewed and updated as indicated. Interim medical history since our last visit reviewed. Allergies and medications reviewed and updated. Outpatient Medications Prior to Visit  Medication Sig Dispense Refill  . acetaminophen (TYLENOL) 650 MG CR tablet Take 650 mg by mouth every 8 (eight) hours as needed for pain.    Marland Kitchen aspirin EC 81 MG tablet Take 1 tablet (81 mg total) by mouth daily. 90 tablet 3  . atorvastatin (LIPITOR) 80 MG tablet Take 1 tablet (80 mg total) by mouth daily. 90 tablet 3  . carvedilol (COREG) 6.25 MG tablet TAKE 1 TABLET BY MOUTH TWICE DAILY 180 tablet 3  . diclofenac sodium  (VOLTAREN) 1 % GEL Apply topically 4 (four) times daily.    . ferrous sulfate 325 (65 FE) MG tablet Take 325 mg by mouth daily with breakfast.    . furosemide (LASIX) 40 MG tablet Take 40 mg by mouth every other day.    Marland Kitchen glucose blood (ONE TOUCH ULTRA TEST) test strip USE ONE STRIP TO CHECK GLUCOSE TWICE DAILY AND  AS  NEEDED. Dx code:  E11.21 150 each 3  . insulin aspart (NOVOLOG) 100 UNIT/ML FlexPen Inject 3-7 Units into the skin as directed.    . Insulin Detemir (LEVEMIR FLEXTOUCH) 100 UNIT/ML Pen Inject 25 Units into the skin daily at 10 pm. 15 pen 3  . levothyroxine (SYNTHROID, LEVOTHROID) 50 MCG tablet TAKE 1 TABLET BY MOUTH ONCE DAILY 90 tablet 2  . lisinopril (PRINIVIL,ZESTRIL) 20 MG tablet Take 1 tablet (20 mg total) by mouth daily. 90 tablet 3  . niacin (SLO-NIACIN) 500 MG tablet Take 1,000 mg by mouth at bedtime.     . nitroGLYCERIN (NITROSTAT) 0.4 MG SL tablet Place 1 tablet (0.4 mg total) under the tongue every 5 (five) minutes as needed for chest pain ((MAX of 3 doses)). 25 tablet 0  . traZODone (DESYREL) 50 MG tablet TAKE 1/2 TO 1 (ONE-HALF TO ONE) TABLET BY MOUTH AT BEDTIME AS NEEDED FOR SLEEP 30 tablet 2   No facility-administered medications prior to visit.      Per  HPI unless specifically indicated in ROS section below Review of Systems     Objective:    BP 122/62 (BP Location: Left Arm, Patient Position: Sitting, Cuff Size: Normal)   Pulse (!) 56   Temp 97.8 F (36.6 C) (Oral)   Ht 5' 10.5" (1.791 m)   Wt 164 lb (74.4 kg)   SpO2 99%   BMI 23.20 kg/m   Wt Readings from Last 3 Encounters:  07/01/17 164 lb (74.4 kg)  06/24/17 164 lb (74.4 kg)  05/19/17 166 lb (75.3 kg)    Physical Exam  Constitutional: He appears well-developed and well-nourished. No distress.  HENT:  Mouth/Throat: Oropharynx is clear and moist. No oropharyngeal exudate.  Cardiovascular: Normal rate, regular rhythm and intact distal pulses.  No murmur heard. Pulmonary/Chest: Effort normal  and breath sounds normal. No respiratory distress. He has no wheezes. He has no rales.  Musculoskeletal: He exhibits edema (1+ pitting edema).  Psychiatric: He has a normal mood and affect.  Nursing note and vitals reviewed.  Results for orders placed or performed in visit on 05/15/17  Renal function panel  Result Value Ref Range   Sodium 145 135 - 145 mEq/L   Potassium 4.0 3.5 - 5.1 mEq/L   Chloride 111 96 - 112 mEq/L   CO2 26 19 - 32 mEq/L   Calcium 9.8 8.4 - 10.5 mg/dL   Albumin 4.2 3.5 - 5.2 g/dL   BUN 34 (H) 6 - 23 mg/dL   Creatinine, Ser 1.59 (H) 0.40 - 1.50 mg/dL   Glucose, Bld 100 (H) 70 - 99 mg/dL   Phosphorus 3.6 2.3 - 4.6 mg/dL   GFR 45.10 (L) >60.00 mL/min  Hemoglobin A1c  Result Value Ref Range   Hgb A1c MFr Bld 6.9 (H) 4.6 - 6.5 %      Assessment & Plan:   Problem List Items Addressed This Visit    CLL (chronic lymphocytic leukemia) (HCC)    Monitoring trending white cells. Appreciate excellent onc care.       Hypertension - Primary    Chronic, stable. Continue current regimen. Doing better off amlodipine.       Uncontrolled type 2 diabetes mellitus with nephropathy (HCC)    Chronic. Increasing hyperglycemia noted. He has been storing his opened levemir in the fridge. Continue current regimen, but I did recommend he store opened levemir at room temperature according to manufacturer instructions.           No orders of the defined types were placed in this encounter.  No orders of the defined types were placed in this encounter.   Follow up plan: Return if symptoms worsen or fail to improve.  [name], MD

## 2017-07-01 NOTE — Patient Instructions (Signed)
Keep open levemir at room temperature. Try this and see effect on sugar control.  Continue current blood pressure medicines - you are doing better off amlodipine.

## 2017-07-01 NOTE — Assessment & Plan Note (Signed)
Chronic. Increasing hyperglycemia noted. He has been storing his opened levemir in the fridge. Continue current regimen, but I did recommend he store opened levemir at room temperature according to manufacturer instructions.

## 2017-07-02 ENCOUNTER — Telehealth: Payer: Self-pay | Admitting: Cardiology

## 2017-07-02 MED ORDER — FUROSEMIDE 20 MG PO TABS: 20.0000 mg | ORAL_TABLET | Freq: Every day | ORAL | 3 refills | Status: DC

## 2017-07-02 MED ORDER — LISINOPRIL 10 MG PO TABS: 10.0000 mg | ORAL_TABLET | Freq: Every day | ORAL | 3 refills | Status: DC

## 2017-07-02 NOTE — Telephone Encounter (Signed)
Pt calling:  Pt c/o BP issue:  1. What are your last 5 BP readings?  5.1.19   117/39 4.30.19  102/43  2. Are you having any other symptoms (ex. Dizziness, headache, blurred vision, passed out)? dizziness 3. What is your medication issue? Low BP

## 2017-07-02 NOTE — Telephone Encounter (Signed)
Spoke with the pt and informed him that Dr Meda Coffee recommends that we decrease his lasix 20 mg po daily, and lisinopril to 10 mg po daily.  Confirmed the pharmacy of choice with the pt.  Pt education provided on how to correctly obtain his BP daily.  Pt verbalized understanding and agrees with this plan.

## 2017-07-02 NOTE — Telephone Encounter (Signed)
Decrease lasix to 20 mg po daily and lisinopril to 10 mg po daily

## 2017-07-02 NOTE — Telephone Encounter (Signed)
Pt calling to give Dr Meda Coffee his current BP readings.  He states he's dizzy with acceptable readings. Will send this message to Dr Meda Coffee to review and advise on stable readings. Will follow-up with the pt thereafter.

## 2017-07-08 ENCOUNTER — Other Ambulatory Visit: Payer: Self-pay

## 2017-07-08 ENCOUNTER — Inpatient Hospital Stay
Admission: EM | Admit: 2017-07-08 | Discharge: 2017-07-15 | DRG: 841 | Disposition: A | Discharge status: Home Health | Payer: Medicare Other | Attending: Internal Medicine | Admitting: Internal Medicine

## 2017-07-08 ENCOUNTER — Emergency Department: Payer: Medicare Other

## 2017-07-08 DIAGNOSIS — K922 Gastrointestinal hemorrhage, unspecified: Secondary | ICD-10-CM | POA: Diagnosis not present

## 2017-07-08 DIAGNOSIS — E1121 Type 2 diabetes mellitus with diabetic nephropathy: Secondary | ICD-10-CM | POA: Diagnosis present

## 2017-07-08 DIAGNOSIS — E785 Hyperlipidemia, unspecified: Secondary | ICD-10-CM | POA: Diagnosis present

## 2017-07-08 DIAGNOSIS — I10 Essential (primary) hypertension: Secondary | ICD-10-CM | POA: Diagnosis not present

## 2017-07-08 DIAGNOSIS — K921 Melena: Secondary | ICD-10-CM | POA: Diagnosis present

## 2017-07-08 DIAGNOSIS — E1122 Type 2 diabetes mellitus with diabetic chronic kidney disease: Secondary | ICD-10-CM | POA: Diagnosis present

## 2017-07-08 DIAGNOSIS — Z7982 Long term (current) use of aspirin: Secondary | ICD-10-CM | POA: Diagnosis not present

## 2017-07-08 DIAGNOSIS — Z7189 Other specified counseling: Secondary | ICD-10-CM | POA: Diagnosis not present

## 2017-07-08 DIAGNOSIS — D649 Anemia, unspecified: Secondary | ICD-10-CM | POA: Diagnosis not present

## 2017-07-08 DIAGNOSIS — H409 Unspecified glaucoma: Secondary | ICD-10-CM | POA: Diagnosis present

## 2017-07-08 DIAGNOSIS — D693 Immune thrombocytopenic purpura: Secondary | ICD-10-CM | POA: Diagnosis present

## 2017-07-08 DIAGNOSIS — Z79899 Other long term (current) drug therapy: Secondary | ICD-10-CM

## 2017-07-08 DIAGNOSIS — I25119 Atherosclerotic heart disease of native coronary artery with unspecified angina pectoris: Secondary | ICD-10-CM | POA: Diagnosis present

## 2017-07-08 DIAGNOSIS — Z66 Do not resuscitate: Secondary | ICD-10-CM | POA: Diagnosis present

## 2017-07-08 DIAGNOSIS — C9110 Chronic lymphocytic leukemia of B-cell type not having achieved remission: Principal | ICD-10-CM | POA: Diagnosis present

## 2017-07-08 DIAGNOSIS — Z833 Family history of diabetes mellitus: Secondary | ICD-10-CM | POA: Diagnosis not present

## 2017-07-08 DIAGNOSIS — I129 Hypertensive chronic kidney disease with stage 1 through stage 4 chronic kidney disease, or unspecified chronic kidney disease: Secondary | ICD-10-CM | POA: Diagnosis present

## 2017-07-08 DIAGNOSIS — E039 Hypothyroidism, unspecified: Secondary | ICD-10-CM | POA: Diagnosis present

## 2017-07-08 DIAGNOSIS — D696 Thrombocytopenia, unspecified: Secondary | ICD-10-CM | POA: Diagnosis not present

## 2017-07-08 DIAGNOSIS — Z8249 Family history of ischemic heart disease and other diseases of the circulatory system: Secondary | ICD-10-CM

## 2017-07-08 DIAGNOSIS — I248 Other forms of acute ischemic heart disease: Secondary | ICD-10-CM | POA: Diagnosis present

## 2017-07-08 DIAGNOSIS — Z794 Long term (current) use of insulin: Secondary | ICD-10-CM | POA: Diagnosis not present

## 2017-07-08 DIAGNOSIS — R109 Unspecified abdominal pain: Secondary | ICD-10-CM | POA: Diagnosis not present

## 2017-07-08 DIAGNOSIS — D62 Acute posthemorrhagic anemia: Secondary | ICD-10-CM

## 2017-07-08 DIAGNOSIS — Z87891 Personal history of nicotine dependence: Secondary | ICD-10-CM

## 2017-07-08 DIAGNOSIS — D630 Anemia in neoplastic disease: Secondary | ICD-10-CM | POA: Diagnosis present

## 2017-07-08 DIAGNOSIS — N183 Chronic kidney disease, stage 3 (moderate): Secondary | ICD-10-CM | POA: Diagnosis not present

## 2017-07-08 DIAGNOSIS — N179 Acute kidney failure, unspecified: Secondary | ICD-10-CM | POA: Diagnosis present

## 2017-07-08 DIAGNOSIS — Z862 Personal history of diseases of the blood and blood-forming organs and certain disorders involving the immune mechanism: Secondary | ICD-10-CM

## 2017-07-08 DIAGNOSIS — E538 Deficiency of other specified B group vitamins: Secondary | ICD-10-CM | POA: Diagnosis present

## 2017-07-08 DIAGNOSIS — Z9861 Coronary angioplasty status: Secondary | ICD-10-CM

## 2017-07-08 DIAGNOSIS — Z800 Family history of malignant neoplasm of digestive organs: Secondary | ICD-10-CM

## 2017-07-08 DIAGNOSIS — Z79890 Hormone replacement therapy (postmenopausal): Secondary | ICD-10-CM | POA: Diagnosis not present

## 2017-07-08 DIAGNOSIS — D500 Iron deficiency anemia secondary to blood loss (chronic): Secondary | ICD-10-CM | POA: Diagnosis not present

## 2017-07-08 DIAGNOSIS — R079 Chest pain, unspecified: Secondary | ICD-10-CM | POA: Diagnosis not present

## 2017-07-08 DIAGNOSIS — I252 Old myocardial infarction: Secondary | ICD-10-CM

## 2017-07-08 DIAGNOSIS — R531 Weakness: Secondary | ICD-10-CM

## 2017-07-08 DIAGNOSIS — I2510 Atherosclerotic heart disease of native coronary artery without angina pectoris: Secondary | ICD-10-CM | POA: Diagnosis not present

## 2017-07-08 DIAGNOSIS — R5383 Other fatigue: Secondary | ICD-10-CM | POA: Diagnosis not present

## 2017-07-08 HISTORY — DX: Gastrointestinal hemorrhage, unspecified: K92.2

## 2017-07-08 LAB — BASIC METABOLIC PANEL
ANION GAP: 6 (ref 5–15)
BUN: 35 mg/dL — AB (ref 6–20)
CALCIUM: 9.2 mg/dL (ref 8.9–10.3)
CO2: 22 mmol/L (ref 22–32)
Chloride: 114 mmol/L — ABNORMAL HIGH (ref 101–111)
Creatinine, Ser: 1.64 mg/dL — ABNORMAL HIGH (ref 0.61–1.24)
GFR calc Af Amer: 45 mL/min — ABNORMAL LOW (ref >60)
GFR, EST NON AFRICAN AMERICAN: 39 mL/min — AB (ref >60)
GLUCOSE: 204 mg/dL — AB (ref 65–99)
POTASSIUM: 4.3 mmol/L (ref 3.5–5.1)
SODIUM: 142 mmol/L (ref 135–145)

## 2017-07-08 LAB — DIFFERENTIAL
BAND NEUTROPHILS: 0 %
BASOS PCT: 0 %
Basophils Absolute: 0 10*3/uL (ref 0–0.1)
Blasts: 0 %
EOS ABS: 0.4 10*3/uL (ref 0–0.7)
Eosinophils Relative: 1 %
LYMPHS PCT: 82 %
Lymphs Abs: 29.7 10*3/uL — ABNORMAL HIGH (ref 1.0–3.6)
METAMYELOCYTES PCT: 0 %
MONO ABS: 0.4 10*3/uL (ref 0.2–1.0)
MONOS PCT: 1 %
Myelocytes: 0 %
NEUTROS ABS: 5.8 10*3/uL (ref 1.4–6.5)
NRBC: 0 /100{WBCs}
Neutrophils Relative %: 16 %
OTHER: 0 %
PROMYELOCYTES RELATIVE: 0 %

## 2017-07-08 LAB — CBC
HEMATOCRIT: 18.5 % — AB (ref 40.0–52.0)
HEMOGLOBIN: 5.6 g/dL — AB (ref 13.0–18.0)
MCH: 33.6 pg (ref 26.0–34.0)
MCHC: 30.2 g/dL — AB (ref 32.0–36.0)
MCV: 111.2 fL — ABNORMAL HIGH (ref 80.0–100.0)
Platelets: 8 10*3/uL — CL (ref 150–440)
RBC: 1.67 MIL/uL — ABNORMAL LOW (ref 4.40–5.90)
RDW: 16 % — AB (ref 11.5–14.5)
WBC: 36.3 10*3/uL — AB (ref 3.8–10.6)

## 2017-07-08 LAB — PROTIME-INR
INR: 1.1
PROTHROMBIN TIME: 14.1 s (ref 11.4–15.2)

## 2017-07-08 LAB — GLUCOSE, CAPILLARY: Glucose-Capillary: 203 mg/dL — ABNORMAL HIGH (ref 65–99)

## 2017-07-08 LAB — TROPONIN I: TROPONIN I: 0.04 ng/mL — AB (ref <0.03)

## 2017-07-08 LAB — HEPATIC FUNCTION PANEL
ALBUMIN: 3.7 g/dL (ref 3.5–5.0)
ALK PHOS: 53 U/L (ref 38–126)
ALT: 13 U/L — ABNORMAL LOW (ref 17–63)
AST: 22 U/L (ref 15–41)
BILIRUBIN INDIRECT: 0.7 mg/dL (ref 0.3–0.9)
Bilirubin, Direct: 0.1 mg/dL (ref 0.1–0.5)
TOTAL PROTEIN: 5.7 g/dL — AB (ref 6.5–8.1)
Total Bilirubin: 0.8 mg/dL (ref 0.3–1.2)

## 2017-07-08 LAB — PATHOLOGIST SMEAR REVIEW

## 2017-07-08 LAB — MRSA PCR SCREENING: MRSA by PCR: NEGATIVE

## 2017-07-08 LAB — ABO/RH: ABO/RH(D): A POS

## 2017-07-08 MED ORDER — PANTOPRAZOLE SODIUM 40 MG IV SOLR
40.0000 mg | Freq: Two times a day (BID) | INTRAVENOUS | Status: DC
  Administered 2017-07-12 – 2017-07-15 (×7): 40 mg via INTRAVENOUS
  Filled 2017-07-08 (×7): qty 40

## 2017-07-08 MED ORDER — ALBUTEROL SULFATE (2.5 MG/3ML) 0.083% IN NEBU
2.5000 mg | INHALATION_SOLUTION | RESPIRATORY_TRACT | Status: DC | PRN
Start: 2017-07-08 — End: 2017-07-15

## 2017-07-08 MED ORDER — INSULIN ASPART 100 UNIT/ML ~~LOC~~ SOLN
0.0000 [IU] | Freq: Three times a day (TID) | SUBCUTANEOUS | Status: DC
  Administered 2017-07-09: 3 [IU] via SUBCUTANEOUS
  Filled 2017-07-08: qty 1

## 2017-07-08 MED ORDER — ACETAMINOPHEN 325 MG PO TABS
650.0000 mg | ORAL_TABLET | Freq: Four times a day (QID) | ORAL | Status: DC | PRN
  Administered 2017-07-14 (×2): 650 mg via ORAL
  Filled 2017-07-08 (×2): qty 2

## 2017-07-08 MED ORDER — SODIUM CHLORIDE 0.9 % IV SOLN
80.0000 mg | Freq: Once | INTRAVENOUS | Status: AC
  Administered 2017-07-08: 80 mg via INTRAVENOUS
  Filled 2017-07-08: qty 80

## 2017-07-08 MED ORDER — SODIUM CHLORIDE 0.9 % IV SOLN
10.0000 mL/h | Freq: Once | INTRAVENOUS | Status: AC
  Administered 2017-07-08: 10 mL/h via INTRAVENOUS

## 2017-07-08 MED ORDER — OXYMETAZOLINE HCL 0.05 % NA SOLN
1.0000 | Freq: Every evening | NASAL | Status: DC | PRN
  Filled 2017-07-08: qty 15

## 2017-07-08 MED ORDER — ONDANSETRON HCL 4 MG PO TABS: 4.0000 mg | ORAL_TABLET | Freq: Four times a day (QID) | ORAL | Status: DC | PRN

## 2017-07-08 MED ORDER — SODIUM CHLORIDE 0.9% FLUSH
3.0000 mL | Freq: Two times a day (BID) | INTRAVENOUS | Status: DC
  Administered 2017-07-09 – 2017-07-15 (×11): 3 mL via INTRAVENOUS

## 2017-07-08 MED ORDER — ONDANSETRON HCL 4 MG/2ML IJ SOLN: 4.0000 mg | Freq: Four times a day (QID) | INTRAMUSCULAR | Status: DC | PRN

## 2017-07-08 MED ORDER — ACETAMINOPHEN 650 MG RE SUPP: 650.0000 mg | Freq: Four times a day (QID) | RECTAL | Status: DC | PRN

## 2017-07-08 MED ORDER — FUROSEMIDE 40 MG PO TABS
40.0000 mg | ORAL_TABLET | Freq: Once | ORAL | Status: DC
  Filled 2017-07-08: qty 1

## 2017-07-08 MED ORDER — SODIUM CHLORIDE 0.9 % IV SOLN
8.0000 mg/h | INTRAVENOUS | Status: AC
  Administered 2017-07-08 – 2017-07-11 (×6): 8 mg/h via INTRAVENOUS
  Filled 2017-07-08 (×7): qty 80

## 2017-07-08 MED ORDER — OXYCODONE HCL 5 MG PO TABS: 5.0000 mg | ORAL_TABLET | ORAL | Status: DC | PRN

## 2017-07-08 NOTE — Progress Notes (Signed)
Discussed with Dr. Mortimer Fries. Platelets only 8 and anemia, getting transfused. Will admit to stepdown overnight till platelets are improved

## 2017-07-08 NOTE — ED Triage Notes (Signed)
Alert, oriented, ambulatory. States "I had a heart attack in the past and about a month ago I was having a lot of low blood pressures." states his doctor has been changing his meds and taking him off meds for BP. States this morning while walking he started having CP. Has been taking nitro which he states relieves pain. States once he sits down the pain decreases. States this has been going on for a month. Also states dizziness with walking. States SOB with movement. States CHF. Denies COPD. Denies cough. No distress noted in triage.

## 2017-07-08 NOTE — Progress Notes (Signed)
Advance care planning  Purpose of Encounter Discussed regarding acute GI bleed, thrombocytopenia, CODE STATUS discussion  Parties in Attendance Patient and his 2 children.  Son and daughter  Patients Decisional capacity Patient is alert and oriented.  Able to make medical decisions  Discussed with patient regarding severe anemia and thrombocytopenia.  Treatment plan.  Consent obtained for transfusions.  Patient would like aggressive treatment at this time including endoscopy and colonoscopy if needed.  We discussed regarding CODE STATUS.  Patient has no advanced directives in place.  But wants his son and daughter to be healthcare decision makers if he cannot.  After much deliberation he has wished to be DO NOT RESUSCITATE and DO NOT INTUBATE.  CODE STATUS changed.  Orders entered.  Goals of care determination High risk for deterioration and further complications.  Being admitted to stepdown unit   Time spent - 18 min

## 2017-07-08 NOTE — ED Notes (Signed)
Floor unable to take report at this time.

## 2017-07-08 NOTE — H&P (Signed)
[facility] [place] NAME: Scott [name]    MR#:  [id number]  DATE OF BIRTH:  1940-12-16  DATE OF ADMISSION:  07/08/2017  PRIMARY CARE PHYSICIAN: [name], MD   REQUESTING/REFERRING PHYSICIAN: Dr. Quentin Cornwall  CHIEF COMPLAINT:   Chief Complaint  Patient presents with  . Chest Pain    HISTORY OF PRESENT ILLNESS:  Scott [name]  is a 77 y.o. male with a known history of diabetes, hypertension, CLL, hypertension presents to the emergency room complaining of 1 month of shortness of breath and feeling fatigued.  He has had worsening of the symptoms since yesterday with chest pain on exertion.  Feels lightheaded and dizzy.  He has had lower extremity edema.  In the emergency room he was found to have anemia with hemoglobin 5.5 and platelets of 8. He has not noticed any frank blood in stool.  Dark stool.  PAST MEDICAL HISTORY:   Past Medical History:  Diagnosis Date  . Acquired hand deformity 1962   hand saw accident at work  . Anemia 10/11/2011  . Arthritis   . Bradycardia 10/10/2011  . CAD (coronary artery disease) 10/10/2011   MI s/p PTCA (Dx-OM2 proximal concentric stenosis)  . CKD (chronic kidney disease) stage 3, GFR 30-59 ml/min (HCC)    Scott [name]  . CLL (chronic lymphocytic leukemia) ([place])   . Colon polyps   . Generalized headaches    frequent  . Glaucoma    s/p laser surgery  . HLD (hyperlipidemia)   . Hypertension   . Hypothyroidism 10/10/2011  . Thrombocytopenia ([place]) 10/11/2011  . Type 2 diabetes with nephropathy [facility])    DM refresher course [facility] (04/2013)    PAST SURGICAL HISTORY:   Past Surgical History:  Procedure Laterality Date  . BACK SURGERY     cervical neck  . CATARACT EXTRACTION, BILATERAL Bilateral 2017  . COLONOSCOPY  11/2008   1 polyp, diverticulosis, rec rpt 5 yrs (Dr. Oletta Lamas, [name])  . COLONOSCOPY  06/2014   hyperplastic polyp, rpt 5 yrs ([place])  . EYE SURGERY  2012   laser surgery for glaucoma  .  PTCA  1994, 1995  . US ECHOCARDIOGRAPHY  10/2013   inferior wall hypokinesis, mild LVH, EF 50-55%, mild MR and LA dilation    SOCIAL HISTORY:   Social History   Tobacco Use  . Smoking status: Former Smoker    Last attempt to quit: 03/04/1978    Years since quitting: 39.3  . Smokeless tobacco: Never Used  Substance Use Topics  . Alcohol use: No    FAMILY HISTORY:   Family History  Problem Relation Age of Onset  . Diabetes Sister   . Stomach cancer Sister 70  . Pneumonia Father        caused death  . Other Brother        no communication with brother so unsure of any health conditions  . Coronary artery disease Son 67       5v CABG and stents  . Hyperlipidemia Sister   . Stroke Neg Hx   . Heart attack Neg Hx     DRUG ALLERGIES:  No Known Allergies  REVIEW OF SYSTEMS:   Review of Systems  Constitutional: Positive for malaise/fatigue. Negative for chills and fever.  HENT: Negative for sore throat.   Eyes: Negative for blurred vision, double vision and pain.  Respiratory: Positive for shortness of breath. Negative for cough, hemoptysis and wheezing.   Cardiovascular: Positive for chest  pain and leg swelling. Negative for palpitations and orthopnea.  Gastrointestinal: Positive for blood in stool. Negative for abdominal pain, constipation, diarrhea, heartburn, nausea and vomiting.  Genitourinary: Negative for dysuria and hematuria.  Musculoskeletal: Negative for back pain and joint pain.  Skin: Negative for rash.  Neurological: Positive for dizziness. Negative for sensory change, speech change, focal weakness and headaches.  Endo/Heme/Allergies: Does not bruise/bleed easily.  Psychiatric/Behavioral: Negative for depression. The patient is not nervous/anxious.     MEDICATIONS AT HOME:   Prior to Admission medications   Medication Sig Start Date End Date Taking? Authorizing Provider  aspirin EC 81 MG tablet Take 1 tablet (81 mg total) by mouth daily. 02/01/16  Yes  Dorothy Spark, MD  atorvastatin (LIPITOR) 80 MG tablet Take 1 tablet (80 mg total) by mouth daily. 05/06/16  Yes Dorothy Spark, MD  carvedilol (COREG) 6.25 MG tablet TAKE 1 TABLET BY MOUTH TWICE DAILY 05/08/17  Yes Dorothy Spark, MD  ferrous sulfate 325 (65 FE) MG tablet Take 325 mg by mouth daily with breakfast.   Yes [provider]  furosemide (LASIX) 20 MG tablet Take 1 tablet (20 mg total) by mouth daily. 07/02/17 09/30/17 Yes Dorothy Spark, MD  insulin aspart (NOVOLOG) 100 UNIT/ML FlexPen Inject 3-7 Units into the skin 3 (three) times daily with meals.    Yes [provider]  Insulin Detemir (LEVEMIR FLEXTOUCH) 100 UNIT/ML Pen Inject 25 Units into the skin daily at 10 pm. 02/17/17  Yes [name], MD  levothyroxine (SYNTHROID, LEVOTHROID) 50 MCG tablet TAKE 1 TABLET BY MOUTH ONCE DAILY 05/27/17  Yes [name], MD  lisinopril (PRINIVIL,ZESTRIL) 10 MG tablet Take 1 tablet (10 mg total) by mouth daily. 07/02/17 09/30/17 Yes Dorothy Spark, MD  niacin (SLO-NIACIN) 500 MG tablet Take 1,000 mg by mouth at bedtime.    Yes [provider]  traZODone (DESYREL) 50 MG tablet TAKE 1/2 TO 1 (ONE-HALF TO ONE) TABLET BY MOUTH AT BEDTIME AS NEEDED FOR SLEEP 06/18/17  Yes [name], MD  acetaminophen (TYLENOL) 650 MG CR tablet Take 650 mg by mouth every 8 (eight) hours as needed for pain.    [provider]  diclofenac sodium (VOLTAREN) 1 % GEL Apply topically 4 (four) times daily.    [provider]  glucose blood (ONE TOUCH ULTRA TEST) test strip USE ONE STRIP TO CHECK GLUCOSE TWICE DAILY AND  AS  NEEDED. Dx code:  E11.21 04/08/17   [name], MD  nitroGLYCERIN (NITROSTAT) 0.4 MG SL tablet Place 1 tablet (0.4 mg total) under the tongue every 5 (five) minutes as needed for chest pain ((MAX of 3 doses)). 05/06/16   Dorothy Spark, MD     VITAL SIGNS:  Blood pressure (!) 150/67, pulse 71, temperature 98.4 F (36.9 C),  temperature source Oral, resp. rate 14, height 5\' 10"  (1.778 m), weight 74.4 kg (164 lb), SpO2 100 %.  PHYSICAL EXAMINATION:  Physical Exam  GENERAL:  77 y.o.-year-old patient lying in the bed with no acute distress.  Pale looking EYES: Pupils equal, round, reactive to light and accommodation. No scleral icterus. Extraocular muscles intact.  HEENT: Head atraumatic, normocephalic. Oropharynx and nasopharynx clear. No oropharyngeal erythema, moist oral mucosa  NECK:  Supple, no jugular venous distention. No thyroid enlargement, no tenderness.  LUNGS: Normal breath sounds bilaterally, no wheezing, rales, rhonchi. No use of accessory muscles of respiration.  CARDIOVASCULAR: S1, S2 normal. No murmurs, rubs, or gallops.  ABDOMEN: Soft, nontender, nondistended.  Bowel sounds present. No organomegaly or mass.  EXTREMITIES: No  cyanosis, or clubbing. + 2 pedal & radial pulses b/l.  Bilateral lower extremity edema NEUROLOGIC: Cranial nerves II through XII are intact. No focal Motor or sensory deficits appreciated b/l PSYCHIATRIC: The patient is alert and oriented x 3. Good affect.  SKIN: No obvious rash, lesion, or ulcer.   LABORATORY PANEL:   CBC Recent Labs  Lab 07/08/17 1439  WBC 36.3*  HGB 5.6*  HCT 18.5*  PLT 8*   ------------------------------------------------------------------------------------------------------------------  Chemistries  Recent Labs  Lab 07/08/17 1439 07/08/17 1605  NA 142  --   K 4.3  --   CL 114*  --   CO2 22  --   GLUCOSE 204*  --   BUN 35*  --   CREATININE 1.64*  --   CALCIUM 9.2  --   AST  --  22  ALT  --  13*  ALKPHOS  --  53  BILITOT  --  0.8   ------------------------------------------------------------------------------------------------------------------  Cardiac Enzymes Recent Labs  Lab 07/08/17 1439  TROPONINI 0.04*    ------------------------------------------------------------------------------------------------------------------  RADIOLOGY:  Dg Chest 2 View  Result Date: 07/08/2017 CLINICAL DATA:  Chest pain EXAM: CHEST - 2 VIEW COMPARISON:  October 21, 2015 FINDINGS: The heart size and mediastinal contours are within normal limits. Both lungs are clear. The visualized skeletal structures are unremarkable. IMPRESSION: No active cardiopulmonary disease. Electronically Signed   By: Dorise Bullion III M.D   On: 07/08/2017 15:35     IMPRESSION AND PLAN:   *Anemia secondary to GI bleed.  Unclear if it is upper GI bleed versus lower GI bleed.  Patient will be transfused 2 units packed RBC stat.  Start him on Protonix drip.  Consult GI.  Will admit to stepdown with his platelets being extremely low and high risk for deterioration.  Clear liquid diet for now.  *Severe thrombocytopenia of 8000.  Transfuse 1 bag platelets.  Discussed with Dr. [name] his oncologist.  ITP is possible.  No schistocytes on the smear.  Could also be consumptive.  Transfuse to keep over 20,000.  *Chest pain.  Likely due to his severe anemia.  EKG shows no acute changes.  Troponin normal.  Dmitriy monitoring.  *Diabetes mellitus.  Decrease Levemir dose to 10 units.  Normally takes 25 units at home.  Sliding scale insulin.  *DVT prophylaxis with teds  All the records are reviewed and case discussed with ED provider. Management plans discussed with the patient, family and they are in agreement.  CODE STATUS: DO NOT RESUSCITATE  TOTAL TIME TAKING CARE OF THIS PATIENT: 40 minutes.   Leia Alf Scott [name] M.D on 07/08/2017 at 5:22 PM  Between 7am to 6pm - Pager - [phone]  After 6pm go to [email or web address] - password [email or web address] [facility]  Office  [phone]  CC: Primary care physician; [name], MD  Note: This dictation was prepared with Dragon dictation along with smaller phrase technology. Any  transcriptional errors that result from this process are unintentional.

## 2017-07-08 NOTE — ED Notes (Signed)
Blood consent signed and in chart

## 2017-07-08 NOTE — Consult Note (Signed)
Name: Scott [name] MRN: [id number] DOB: 1940-08-06    ADMISSION DATE:  07/08/2017 CONSULTATION DATE: 07/08/2017  REFERRING MD : Dr. Darvin Neighbours   CHIEF COMPLAINT: Chest Pain   BRIEF PATIENT DESCRIPTION:  77 yo male with a hx of CLL admitted with severe anemia likely secondary to GI Bleed guaiac positive , severe thrombocytopenia, and angina    SIGNIFICANT EVENTS  05/7-Pt admitted to ICU with anemia and severe thrombocytopenia   STUDIES:  None  HISTORY OF PRESENT ILLNESS:   This is a 77 yo male with a PMH of Thrombocytopenia, Type II Diabetes Mellitus, Hypothyroidism, HTN, Hyperlipidemia, Colon Polyps, CLL (most recent oncology visit on 04/21/17 pt more anemic, platelet count decreased but stable, and wbc increased planned to consider treatment with weekly Rituxan if wbc continued to increase), CKD Stage III (baseline GFR 30-59 ml/min), CAD, Bradycardia, Arthritis, and Anemia.  He presented to [facility] ER on 05/7 with c/o intermittent midsternal chest pain, exertional dyspnea, generalized weakness, and dizziness. Per ER notes the symptoms started a month ago and has progressively worsened.  He also endorsed hypotension and his PCP has been adjusting his blood pressure medications over the past month. Pt also states he has had dark stools, he does take iron, tylenol, and aspirin daily.  In the ER EKG NSR, no ST elevation, and heart rate 65. Lab results revealed stool guaiac positive, creatinine 1.64, troponin 0.04, wbc 36.3, hbg 5.6, and platelets 8,000 concerning for possible ITP.  Therefore, orders placed for 1 unit of platelets, 2 units of pRBC's, Oncology and GI consulted. He was subsequently admitted to the stepdown unit by hospitalist team for further workup and treatment.   PAST MEDICAL HISTORY :   has a past medical history of Acquired hand deformity (1962), Anemia (10/11/2011), Arthritis, Bradycardia (10/10/2011), CAD (coronary artery disease) (10/10/2011), CKD (chronic kidney disease) stage 3, GFR  30-59 ml/min (HCC), CLL (chronic lymphocytic leukemia) ([place]), Colon polyps, Generalized headaches, Glaucoma, HLD (hyperlipidemia), Hypertension, Hypothyroidism (10/10/2011), Thrombocytopenia ([place]) (10/11/2011), and Type 2 diabetes with nephropathy ([place]).  has a past surgical history that includes Back surgery; Mitral valve replacement (1994, 1995); Colonoscopy (11/2008); Eye surgery (2012); US ECHOCARDIOGRAPHY (10/2013); Colonoscopy (06/2014); and Cataract extraction, bilateral (Bilateral, 2017). Prior to Admission medications   Medication Sig Start Date End Date Taking? Authorizing Provider  aspirin EC 81 MG tablet Take 1 tablet (81 mg total) by mouth daily. 02/01/16  Yes Dorothy Spark, MD  atorvastatin (LIPITOR) 80 MG tablet Take 1 tablet (80 mg total) by mouth daily. 05/06/16  Yes Dorothy Spark, MD  carvedilol (COREG) 6.25 MG tablet TAKE 1 TABLET BY MOUTH TWICE DAILY 05/08/17  Yes Dorothy Spark, MD  ferrous sulfate 325 (65 FE) MG tablet Take 325 mg by mouth daily with breakfast.   Yes [provider]  furosemide (LASIX) 20 MG tablet Take 1 tablet (20 mg total) by mouth daily. 07/02/17 09/30/17 Yes Dorothy Spark, MD  insulin aspart (NOVOLOG) 100 UNIT/ML FlexPen Inject 3-7 Units into the skin 3 (three) times daily with meals.    Yes [provider]  Insulin Detemir (LEVEMIR FLEXTOUCH) 100 UNIT/ML Pen Inject 25 Units into the skin daily at 10 pm. 02/17/17  Yes [name], MD  levothyroxine (SYNTHROID, LEVOTHROID) 50 MCG tablet TAKE 1 TABLET BY MOUTH ONCE DAILY 05/27/17  Yes [name], MD  lisinopril (PRINIVIL,ZESTRIL) 10 MG tablet Take 1 tablet (10 mg total) by mouth daily. 07/02/17 09/30/17 Yes Dorothy Spark, MD  niacin (SLO-NIACIN) 500 MG  tablet Take 1,000 mg by mouth at bedtime.    Yes [provider]  traZODone (DESYREL) 50 MG tablet TAKE 1/2 TO 1 (ONE-HALF TO ONE) TABLET BY MOUTH AT BEDTIME AS NEEDED FOR SLEEP 06/18/17  Yes [name], MD    acetaminophen (TYLENOL) 650 MG CR tablet Take 650 mg by mouth every 8 (eight) hours as needed for pain.    [provider]  diclofenac sodium (VOLTAREN) 1 % GEL Apply topically 4 (four) times daily.    [provider]  glucose blood (ONE TOUCH ULTRA TEST) test strip USE ONE STRIP TO CHECK GLUCOSE TWICE DAILY AND  AS  NEEDED. Dx code:  E11.21 04/08/17   [name], MD  nitroGLYCERIN (NITROSTAT) 0.4 MG SL tablet Place 1 tablet (0.4 mg total) under the tongue every 5 (five) minutes as needed for chest pain ((MAX of 3 doses)). 05/06/16   Dorothy Spark, MD   No Known Allergies  FAMILY HISTORY:  family history includes Coronary artery disease (age of onset: 39) in his son; Diabetes in his sister; Hyperlipidemia in his sister; Other in his brother; Pneumonia in his father; Stomach cancer (age of onset: 77) in his sister. SOCIAL HISTORY:  reports that he quit smoking about 39 years ago. He has never used smokeless tobacco. He reports that he does not drink alcohol or use drugs.  REVIEW OF SYSTEMS: Positives in BOLD  Constitutional: Negative for fever, chills, weight loss, malaise/fatigue and diaphoresis.  HENT: Negative for hearing loss, ear pain, nosebleeds, congestion, sore throat, neck pain, tinnitus and ear discharge.   Eyes: Negative for blurred vision, double vision, photophobia, pain, discharge and redness.  Respiratory:cough, hemoptysis, sputum production, exertional shortness of breath, wheezing and stridor.   Cardiovascular: chest pain, palpitations, orthopnea, claudication, leg swelling and PND.  Gastrointestinal: heartburn, nausea, vomiting, abdominal pain, diarrhea, constipation, blood in stool and melena.  Genitourinary: Negative for dysuria, urgency, frequency, hematuria and flank pain.  Musculoskeletal: Negative for myalgias, back pain, joint pain and falls.  Skin: Negative for itching and rash.  Neurological: dizziness, tingling, tremors, sensory change,  speech change, focal weakness, seizures, loss of consciousness, weakness and headaches.  Endo/Heme/Allergies: Negative for environmental allergies and polydipsia. Does not bruise/bleed easily.  SUBJECTIVE:  No complaints at this time  VITAL SIGNS: Temp:  [98.4 F (36.9 C)] 98.4 F (36.9 C) (05/07 1436) Pulse Rate:  [56-101] 101 (05/07 1830) Resp:  [12-20] 20 (05/07 1830) BP: (132-156)/(53-72) 146/58 (05/07 1830) SpO2:  [98 %-100 %] 100 % (05/07 1830) Weight:  [74.4 kg (164 lb)] 74.4 kg (164 lb) (05/07 1437)  PHYSICAL EXAMINATION: General: well developed, well nourished male, NAD  Neuro: alert and oriented, follows commands  HEENT: supple, no JVD  Cardiovascular: nsr, rrr, no R/G Lungs: clear throughout, even, non labored  Abdomen: +BS x4, soft, obese, non tender, non distended  Musculoskeletal: trace bilateral lower extremity edema  Skin: pale, intact no rashes or lesions   Recent Labs  Lab 07/08/17 1439  NA 142  K 4.3  CL 114*  CO2 22  BUN 35*  CREATININE 1.64*  GLUCOSE 204*   Recent Labs  Lab 07/08/17 1439  HGB 5.6*  HCT 18.5*  WBC 36.3*  PLT 8*   Dg Chest 2 View  Result Date: 07/08/2017 CLINICAL DATA:  Chest pain EXAM: CHEST - 2 VIEW COMPARISON:  October 21, 2015 FINDINGS: The heart size and mediastinal contours are within normal limits. Both lungs are clear. The visualized skeletal structures are unremarkable. IMPRESSION: No  active cardiopulmonary disease. Electronically Signed   By: Dorise Bullion III M.D   On: 07/08/2017 15:35    ASSESSMENT / PLAN: Symptomatic Anemia likely secondary to GI Bleed guaiac positive   Severe Thrombocytopenia CLL Angina likely secondary to severe anemia  Mildly elevated troponin likely secondary to demand ischemia in setting of severe anemia  Acute on chronic renal failure  Diabetes Mellitus P: Supplemental O2 for dyspnea and/or hypoxia  Prn bronchodilator therapy Continuous telemetry monitoring VTE px: SCD's, avoid  chemical prophylaxis  Continue protonix gtt  Trend CBC Monitor for s/sx of bleeding and transfuse for hgb <7 Transfuse platelets to keep platelet count >40,000 GI and Oncology consulted appreciate input  Trend BMP Replace electrolytes as indicated Monitor UOP Trend WBC and monitor fever curve  SSI   Marda Stalker, [place] Pager [phone] (please enter 7 digits) PCCM Consult Pager [phone] (please enter 7 digits)

## 2017-07-08 NOTE — ED Notes (Signed)
Floor unable to take report at this time, primary RN made aware

## 2017-07-08 NOTE — Progress Notes (Signed)
eLink Physician-Brief Progress Note Patient Name: Scott [name] DOB: 09/05/1940 MRN: [id number]   Date of Service  07/08/2017  HPI/Events of Note  77 yo male with a hx of CLL admitted with severe anemia concerning for GI bleed although source of bleed unknown, severe thrombocytopenia, and angina . PCCM asked to assume care in stepdown unit. VSS. Being transfused 2 units PRBC and 1 unit single donor platelets for Hgb = 5.6 and Platelet count = 8.   eICU Interventions  No new orders.      Intervention Category Evaluation Type: New Patient Evaluation  Lysle Dingwall 07/08/2017, 10:13 PM

## 2017-07-08 NOTE — ED Notes (Signed)
RN Lorriane Shire given report

## 2017-07-08 NOTE — ED Provider Notes (Signed)
[facility] Emergency Department Provider Note    First MD Initiated Contact with Patient 07/08/17 1552     (approximate)  I have reviewed the triage vital signs and the nursing notes.   HISTORY  Chief Complaint Chest Pain    HPI ELCHANAN [name] is a 77 y.o. male with a history of anemia as well as CLL on aspirin as well as a history of CAD and CKD presents to the ER with progressively worsening generalized weakness and intermittent episodes of midsternal chest pain significant dizziness when he stands and malaise.  States it is progressively worsening over the past month.  States he was having episodes of low blood pressure 1 month ago and his PCP made some changes to his blood pressure medications but he has had persistent symptoms since then.  States he came today because he was having worsening discomfort and significant exertional dyspnea.  Past Medical History:  Diagnosis Date  . Acquired hand deformity 1962   hand saw accident at work  . Anemia 10/11/2011  . Arthritis   . Bradycardia 10/10/2011  . CAD (coronary artery disease) 10/10/2011   MI s/p PTCA (Dx-OM2 proximal concentric stenosis)  . CKD (chronic kidney disease) stage 3, GFR 30-59 ml/min (HCC)    [name]  . CLL (chronic lymphocytic leukemia) ([place])   . Colon polyps   . Generalized headaches    frequent  . Glaucoma    s/p laser surgery  . HLD (hyperlipidemia)   . Hypertension   . Hypothyroidism 10/10/2011  . Thrombocytopenia ([place]) 10/11/2011  . Type 2 diabetes with nephropathy [facility])    DM refresher course [facility] (04/2013)   Family History  Problem Relation Age of Onset  . Diabetes Sister   . Stomach cancer Sister 72  . Pneumonia Father        caused death  . Other Brother        no communication with brother so unsure of any health conditions  . Coronary artery disease Son 54       5v CABG and stents  . Hyperlipidemia Sister   . Stroke Neg Hx   . Heart attack Neg Hx    Past Surgical  History:  Procedure Laterality Date  . BACK SURGERY     cervical neck  . CATARACT EXTRACTION, BILATERAL Bilateral 2017  . COLONOSCOPY  11/2008   1 polyp, diverticulosis, rec rpt 5 yrs (Dr. Oletta Lamas, [name])  . COLONOSCOPY  06/2014   hyperplastic polyp, rpt 5 yrs ([place])  . EYE SURGERY  2012   laser surgery for glaucoma  . PTCA  1994, 1995  . US ECHOCARDIOGRAPHY  10/2013   inferior wall hypokinesis, mild LVH, EF 50-55%, mild MR and LA dilation   Patient Active Problem List   Diagnosis Date Noted  . GI bleed 07/08/2017  . Right knee pain 05/19/2017  . Glaucoma   . Generalized headaches   . Colon polyps   . Arthritis   . CLL (chronic lymphocytic leukemia) ([place]) 01/18/2016  . Grieving 01/03/2016  . Chronic insomnia 01/03/2016  . Diabetic neuropathy ([place]) 06/09/2015  . BPV (benign positional vertigo) 05/23/2014  . Advanced care planning/counseling discussion 01/25/2014  . Chest pain 10/26/2013  . Medicare annual wellness visit, subsequent 07/06/2012  . HLD (hyperlipidemia)   . CKD (chronic kidney disease) stage 3, GFR 30-59 ml/min (HCC)   . Uncontrolled type 2 diabetes mellitus with nephropathy ([place])   . Anemia in CKD (chronic kidney disease) 10/11/2011  .  Thrombocytopenia ([place]) 10/11/2011  . Anemia 10/11/2011  . CAD (coronary artery disease) 10/10/2011  . Hypertension 10/10/2011  . Hypothyroidism 10/10/2011  . Bradycardia 10/10/2011  . Acquired hand deformity 03/04/1960      Prior to Admission medications   Medication Sig Start Date End Date Taking? Authorizing Provider  aspirin EC 81 MG tablet Take 1 tablet (81 mg total) by mouth daily. 02/01/16  Yes Dorothy Spark, MD  atorvastatin (LIPITOR) 80 MG tablet Take 1 tablet (80 mg total) by mouth daily. 05/06/16  Yes Dorothy Spark, MD  carvedilol (COREG) 6.25 MG tablet TAKE 1 TABLET BY MOUTH TWICE DAILY 05/08/17  Yes Dorothy Spark, MD  ferrous sulfate 325 (65 FE) MG tablet Take 325 mg by mouth daily with breakfast.    Yes [provider]  furosemide (LASIX) 20 MG tablet Take 1 tablet (20 mg total) by mouth daily. 07/02/17 09/30/17 Yes Dorothy Spark, MD  insulin aspart (NOVOLOG) 100 UNIT/ML FlexPen Inject 3-7 Units into the skin 3 (three) times daily with meals.    Yes [provider]  Insulin Detemir (LEVEMIR FLEXTOUCH) 100 UNIT/ML Pen Inject 25 Units into the skin daily at 10 pm. 02/17/17  Yes [name], MD  levothyroxine (SYNTHROID, LEVOTHROID) 50 MCG tablet TAKE 1 TABLET BY MOUTH ONCE DAILY 05/27/17  Yes [name], MD  lisinopril (PRINIVIL,ZESTRIL) 10 MG tablet Take 1 tablet (10 mg total) by mouth daily. 07/02/17 09/30/17 Yes Dorothy Spark, MD  niacin (SLO-NIACIN) 500 MG tablet Take 1,000 mg by mouth at bedtime.    Yes [provider]  traZODone (DESYREL) 50 MG tablet TAKE 1/2 TO 1 (ONE-HALF TO ONE) TABLET BY MOUTH AT BEDTIME AS NEEDED FOR SLEEP 06/18/17  Yes [name], MD  acetaminophen (TYLENOL) 650 MG CR tablet Take 650 mg by mouth every 8 (eight) hours as needed for pain.    [provider]  diclofenac sodium (VOLTAREN) 1 % GEL Apply topically 4 (four) times daily.    [provider]  glucose blood (ONE TOUCH ULTRA TEST) test strip USE ONE STRIP TO CHECK GLUCOSE TWICE DAILY AND  AS  NEEDED. Dx code:  E11.21 04/08/17   [name], MD  nitroGLYCERIN (NITROSTAT) 0.4 MG SL tablet Place 1 tablet (0.4 mg total) under the tongue every 5 (five) minutes as needed for chest pain ((MAX of 3 doses)). 05/06/16   Dorothy Spark, MD    Allergies Patient has no known allergies.    Social History Social History   Tobacco Use  . Smoking status: Former Smoker    Last attempt to quit: 03/04/1978    Years since quitting: 39.3  . Smokeless tobacco: Never Used  Substance Use Topics  . Alcohol use: No  . Drug use: No    Review of Systems Patient denies headaches, rhinorrhea, blurry vision, numbness, shortness of breath, chest pain, edema,  cough, abdominal pain, nausea, vomiting, diarrhea, dysuria, fevers, rashes or hallucinations unless otherwise stated above in HPI. ____________________________________________   PHYSICAL EXAM:  VITAL SIGNS: Vitals:   07/08/17 1800 07/08/17 1830  BP: (!) 142/53 (!) 146/58  Pulse: (!) 56 (!) 101  Resp: 12 20  Temp:    SpO2: 100% 100%    Constitutional: Alert and oriented. Pale appearing Eyes: Conjunctivae are normal. Pale sclera Head: Atraumatic. Nose: No congestion/rhinnorhea. Mouth/Throat: Mucous membranes are moist.   Neck: No stridor. Painless ROM.  Cardiovascular: Normal rate, regular rhythm. Grossly normal heart sounds.  Good peripheral circulation. Respiratory: Normal respiratory  effort.  No retractions. Lungs CTAB. Gastrointestinal: Soft and nontender. No distention. No abdominal bruits. No CVA tenderness. Genitourinary: guaiac positive maroon colored stool Musculoskeletal: No lower extremity tenderness nor edema.  No joint effusions. Neurologic:  Normal speech and language. No gross focal neurologic deficits are appreciated. No facial droop Skin:  Skin is warm, dry and intact. No rash noted. Psychiatric: Mood and affect are normal. Speech and behavior are normal.  ____________________________________________   LABS (all labs ordered are listed, but only abnormal results are displayed)  Results for orders placed or performed during the hospital encounter of 07/08/17 (from the past 24 hour(s))  Basic metabolic panel     Status: Abnormal   Collection Time: 07/08/17  2:39 PM  Result Value Ref Range   Sodium 142 135 - 145 mmol/L   Potassium 4.3 3.5 - 5.1 mmol/L   Chloride 114 (H) 101 - 111 mmol/L   CO2 22 22 - 32 mmol/L   Glucose, Bld 204 (H) 65 - 99 mg/dL   BUN 35 (H) 6 - 20 mg/dL   Creatinine, Ser 1.64 (H) 0.61 - 1.24 mg/dL   Calcium 9.2 8.9 - 10.3 mg/dL   GFR calc non Af Amer 39 (L) >60 mL/min   GFR calc Af Amer 45 (L) >60 mL/min   Anion gap 6 5 - 15  CBC      Status: Abnormal   Collection Time: 07/08/17  2:39 PM  Result Value Ref Range   WBC 36.3 (H) 3.8 - 10.6 K/uL   RBC 1.67 (L) 4.40 - 5.90 MIL/uL   Hemoglobin 5.6 (L) 13.0 - 18.0 g/dL   HCT 18.5 (L) 40.0 - 52.0 %   MCV 111.2 (H) 80.0 - 100.0 fL   MCH 33.6 26.0 - 34.0 pg   MCHC 30.2 (L) 32.0 - 36.0 g/dL   RDW 16.0 (H) 11.5 - 14.5 %   Platelets 8 (LL) 150 - 440 K/uL  Troponin I     Status: Abnormal   Collection Time: 07/08/17  2:39 PM  Result Value Ref Range   Troponin I 0.04 (HH) <0.03 ng/mL  Pathologist smear review     Status: None   Collection Time: 07/08/17  2:39 PM  Result Value Ref Range   Path Review Peripheral blood smear is reviewed.    Differential     Status: Abnormal   Collection Time: 07/08/17  2:39 PM  Result Value Ref Range   Neutrophils Relative % 16 %   Lymphocytes Relative 82 %   Monocytes Relative 1 %   Eosinophils Relative 1 %   Basophils Relative 0 %   Band Neutrophils 0 %   Metamyelocytes Relative 0 %   Myelocytes 0 %   Promyelocytes Relative 0 %   Blasts 0 %   nRBC 0 0 /100 WBC   Other 0 %   Neutro Abs 5.8 1.4 - 6.5 K/uL   Lymphs Abs 29.7 (H) 1.0 - 3.6 K/uL   Monocytes Absolute 0.4 0.2 - 1.0 K/uL   Eosinophils Absolute 0.4 0 - 0.7 K/uL   Basophils Absolute 0.0 0 - 0.1 K/uL   RBC Morphology MIXED RBC POPULATION   ABO/Rh     Status: None   Collection Time: 07/08/17  2:39 PM  Result Value Ref Range   ABO/RH(D)      A POS Performed at [facility], [address] [place], [place] [address]   Hepatic function panel     Status: Abnormal   Collection Time: 07/08/17  4:05 PM  Result Value Ref Range   Total Protein 5.7 (L) 6.5 - 8.1 g/dL   Albumin 3.7 3.5 - 5.0 g/dL   AST 22 15 - 41 U/L   ALT 13 (L) 17 - 63 U/L   Alkaline Phosphatase 53 38 - 126 U/L   Total Bilirubin 0.8 0.3 - 1.2 mg/dL   Bilirubin, Direct 0.1 0.1 - 0.5 mg/dL   Indirect Bilirubin 0.7 0.3 - 0.9 mg/dL  Protime-INR     Status: None   Collection Time: 07/08/17  4:05 PM    Result Value Ref Range   Prothrombin Time 14.1 11.4 - 15.2 seconds   INR 1.10   Type and screen Ordered by PROVIDER DEFAULT     Status: None (Preliminary result)   Collection Time: 07/08/17  4:30 PM  Result Value Ref Range   ABO/RH(D) A POS    Antibody Screen POS    Sample Expiration 07/11/2017    DAT, IgG POS    DAT, complement      POS Performed at [facility], [address] [place], [place] [address]    Antibody Identification PENDING    Antibody ID,T [place] PENDING   Prepare Pheresed Platelets     Status: None (Preliminary result)   Collection Time: 07/08/17  5:01 PM  Result Value Ref Range   Unit Number [id number]    Blood Component Type PLTP LR1 PAS    Unit division 00    Status of Unit ALLOCATED    Transfusion Status OK TO TRANSFUSE    ____________________________________________  EKG My review and personal interpretation at Time: 14:41   Indication: sob  Rate: 65  Rhythm: sinus Axis: normal  Other: normal intervals, nonspecific st abn, no stemi ____________________________________________  RADIOLOGY  I personally reviewed all radiographic images ordered to evaluate for the above acute complaints and reviewed radiology reports and findings.  These findings were personally discussed with the patient.  Please see medical record for radiology report.  ____________________________________________   PROCEDURES  Procedure(s) performed:  .Critical Care Performed by: Merlyn Lot, MD Authorized by: Merlyn Lot, MD   Critical care provider statement:    Critical care time (minutes):  40   Critical care time was exclusive of:  Separately billable procedures and treating other patients   Critical care was necessary to treat or prevent imminent or life-threatening deterioration of the following conditions: GI bleed, anemia.   Critical care was time spent personally by me on the following activities:  Development of treatment plan with patient  or surrogate, discussions with consultants, evaluation of patient's response to treatment, examination of patient, obtaining history from patient or surrogate, ordering and performing treatments and interventions, ordering and review of laboratory studies, ordering and review of radiographic studies, pulse oximetry, re-evaluation of patient's condition and review of old charts      Critical Care performed: yes  ____________________________________________   INITIAL IMPRESSION / [place] / ED COURSE  Pertinent labs & imaging results that were available during my care of the patient were reviewed by me and considered in my medical decision making (see chart for details).  DDX: GI bleed, acute blood loss anemia, malignancy, liver disease, blast crisis, hemolytic anemia  ROMELLE REILEY is a 77 y.o. who presents to the ED with symptoms as described above.  Patient is critically anemic pale and I do suspect primary etiology of his symptoms are secondary to evidence of GI bleed with acute blood loss anemia.  Presentation complicated by  his underlying CLL and the fact that he has 8000 platelets on peripheral smear.  We will need to transfuse both.  The patient will be placed on continuous pulse oximetry and telemetry for monitoring.  Laboratory evaluation will be sent to evaluate for the above complaints.     Clinical Course as of Jul 09 2003  Tue Jul 08, 2017  1655 No evidence of schistocytes on peripheral smear.  More likely secondary to GI blood loss given guaiac positive stool.  Patient started on Protonix.  Will require admission the hospital for further medical management. Will also order platelet transfusion. Have discussed with the patient and available family all diagnostics and treatments performed thus far and all questions were answered to the best of my ability. The patient demonstrates understanding and agreement with plan.    [PR]    Clinical Course User Index [PR]  Merlyn Lot, MD     As part of my medical decision making, I reviewed the following data within the [place] notes reviewed and incorporated, Labs reviewed, notes from prior ED visits and [place] Controlled Substance Database   ____________________________________________   FINAL CLINICAL IMPRESSION(S) / ED DIAGNOSES  Final diagnoses:  Acute blood loss anemia  Thrombocytopenia (HCC)  Weakness      NEW MEDICATIONS STARTED DURING THIS VISIT:  New Prescriptions   No medications on file     Note:  This document was prepared using Dragon voice recognition software and may include unintentional dictation errors.    Merlyn Lot, MD 07/08/17 2005

## 2017-07-09 DIAGNOSIS — D649 Anemia, unspecified: Secondary | ICD-10-CM

## 2017-07-09 DIAGNOSIS — D696 Thrombocytopenia, unspecified: Secondary | ICD-10-CM

## 2017-07-09 LAB — BPAM PLATELET PHERESIS
BLOOD PRODUCT EXPIRATION DATE: 201905092359
ISSUE DATE / TIME: 201905072246
Unit Type and Rh: 5100

## 2017-07-09 LAB — BASIC METABOLIC PANEL
ANION GAP: 6 (ref 5–15)
BUN: 34 mg/dL — ABNORMAL HIGH (ref 6–20)
CO2: 22 mmol/L (ref 22–32)
Calcium: 8.8 mg/dL — ABNORMAL LOW (ref 8.9–10.3)
Chloride: 115 mmol/L — ABNORMAL HIGH (ref 101–111)
Creatinine, Ser: 1.64 mg/dL — ABNORMAL HIGH (ref 0.61–1.24)
GFR, EST AFRICAN AMERICAN: 45 mL/min — AB (ref >60)
GFR, EST NON AFRICAN AMERICAN: 39 mL/min — AB (ref >60)
Glucose, Bld: 250 mg/dL — ABNORMAL HIGH (ref 65–99)
Potassium: 4.2 mmol/L (ref 3.5–5.1)
SODIUM: 143 mmol/L (ref 135–145)

## 2017-07-09 LAB — PREPARE PLATELET PHERESIS: UNIT DIVISION: 0

## 2017-07-09 LAB — GLUCOSE, CAPILLARY
GLUCOSE-CAPILLARY: 236 mg/dL — AB (ref 65–99)
GLUCOSE-CAPILLARY: 159 mg/dL — AB (ref 65–99)
Glucose-Capillary: 234 mg/dL — ABNORMAL HIGH (ref 65–99)
Glucose-Capillary: 151 mg/dL — ABNORMAL HIGH (ref 65–99)

## 2017-07-09 LAB — CBC
HCT: 16.7 % — ABNORMAL LOW (ref 40.0–52.0)
HEMOGLOBIN: 5.2 g/dL — AB (ref 13.0–18.0)
MCH: 34.2 pg — ABNORMAL HIGH (ref 26.0–34.0)
MCHC: 30.9 g/dL — ABNORMAL LOW (ref 32.0–36.0)
MCV: 110.8 fL — ABNORMAL HIGH (ref 80.0–100.0)
Platelets: 12 10*3/uL — CL (ref 150–440)
RBC: 1.51 MIL/uL — AB (ref 4.40–5.90)
RDW: 15.8 % — AB (ref 11.5–14.5)
WBC: 28.4 10*3/uL — AB (ref 3.8–10.6)

## 2017-07-09 LAB — FERRITIN: Ferritin: 21 ng/mL — ABNORMAL LOW (ref 24–336)

## 2017-07-09 LAB — IRON AND TIBC
IRON: 32 ug/dL — AB (ref 45–182)
Saturation Ratios: 11 % — ABNORMAL LOW (ref 17.9–39.5)
TIBC: 290 ug/dL (ref 250–450)
UIBC: 258 ug/dL

## 2017-07-09 LAB — TSH: TSH: 4.365 u[IU]/mL (ref 0.350–4.500)

## 2017-07-09 LAB — RETICULOCYTES
RBC.: 1.55 MIL/uL — ABNORMAL LOW (ref 4.40–5.90)
RETIC CT PCT: 5.6 % — AB (ref 0.4–3.1)
Retic Count, Absolute: 86.8 10*3/uL (ref 19.0–183.0)

## 2017-07-09 LAB — LACTATE DEHYDROGENASE: LDH: 178 U/L (ref 98–192)

## 2017-07-09 LAB — VITAMIN B12: Vitamin B-12: 190 pg/mL (ref 180–914)

## 2017-07-09 LAB — HEMOGLOBIN A1C
Hgb A1c MFr Bld: 5 % (ref 4.8–5.6)
Mean Plasma Glucose: 96.8 mg/dL

## 2017-07-09 LAB — FOLATE: Folate: 22.2 ng/mL (ref >5.9)

## 2017-07-09 MED ORDER — INSULIN ASPART 100 UNIT/ML ~~LOC~~ SOLN
0.0000 [IU] | Freq: Three times a day (TID) | SUBCUTANEOUS | Status: DC
  Administered 2017-07-09: 3 [IU] via SUBCUTANEOUS
  Administered 2017-07-10: 11 [IU] via SUBCUTANEOUS
  Administered 2017-07-10: 15 [IU] via SUBCUTANEOUS
  Administered 2017-07-10 – 2017-07-11 (×2): 5 [IU] via SUBCUTANEOUS
  Administered 2017-07-11: 8 [IU] via SUBCUTANEOUS
  Administered 2017-07-11: 5 [IU] via SUBCUTANEOUS
  Administered 2017-07-12: 3 [IU] via SUBCUTANEOUS
  Administered 2017-07-12: 5 [IU] via SUBCUTANEOUS
  Administered 2017-07-12 – 2017-07-13 (×3): 8 [IU] via SUBCUTANEOUS
  Administered 2017-07-14: 2 [IU] via SUBCUTANEOUS
  Administered 2017-07-14: 09:00:00 5 [IU] via SUBCUTANEOUS
  Administered 2017-07-14: 18:00:00 2 [IU] via SUBCUTANEOUS
  Administered 2017-07-15 (×2): 3 [IU] via SUBCUTANEOUS
  Filled 2017-07-09 (×17): qty 1

## 2017-07-09 MED ORDER — PREDNISONE 50 MG PO TABS
70.0000 mg | ORAL_TABLET | Freq: Every day | ORAL | Status: DC
  Administered 2017-07-09 – 2017-07-14 (×6): 70 mg via ORAL
  Filled 2017-07-09 (×6): qty 1

## 2017-07-09 MED ORDER — INSULIN DETEMIR 100 UNIT/ML ~~LOC~~ SOLN
12.0000 [IU] | Freq: Every day | SUBCUTANEOUS | Status: DC
  Administered 2017-07-09 – 2017-07-10 (×2): 12 [IU] via SUBCUTANEOUS
  Filled 2017-07-09 (×3): qty 0.12

## 2017-07-09 MED ORDER — DIPHENHYDRAMINE HCL 50 MG/ML IJ SOLN
12.5000 mg | Freq: Once | INTRAMUSCULAR | Status: AC
  Administered 2017-07-09: 12.5 mg via INTRAVENOUS
  Filled 2017-07-09: qty 1

## 2017-07-09 MED ORDER — INSULIN ASPART 100 UNIT/ML ~~LOC~~ SOLN
0.0000 [IU] | SUBCUTANEOUS | Status: DC
  Administered 2017-07-09: 5 [IU] via SUBCUTANEOUS
  Filled 2017-07-09: qty 1

## 2017-07-09 MED ORDER — DIPHENHYDRAMINE HCL 50 MG/ML IJ SOLN
25.0000 mg | Freq: Once | INTRAMUSCULAR | Status: AC
  Administered 2017-07-09: 25 mg via INTRAVENOUS
  Filled 2017-07-09: qty 1

## 2017-07-09 MED ORDER — INSULIN ASPART 100 UNIT/ML ~~LOC~~ SOLN
0.0000 [IU] | Freq: Every day | SUBCUTANEOUS | Status: DC
  Administered 2017-07-10: 2 [IU] via SUBCUTANEOUS
  Administered 2017-07-11 – 2017-07-12 (×2): 3 [IU] via SUBCUTANEOUS
  Administered 2017-07-13: 21:00:00 2 [IU] via SUBCUTANEOUS
  Administered 2017-07-14: 21:00:00 3 [IU] via SUBCUTANEOUS
  Filled 2017-07-09 (×4): qty 1

## 2017-07-09 NOTE — Consult Note (Signed)
[facility]  [phone] [phone]  ID: Scott [name] OB: 1941/02/14  MR#: [id number]  HXT#:[id number]  Patient Care Team: [name], MD as PCP - General (Family Medicine) Lloyd Huger, MD as Consulting Physician (Oncology) Clent Jacks, MD as Consulting Physician (Ophthalmology) Fleet Contras, MD as Consulting Physician (Nephrology) Dorothy Spark, MD as Consulting Physician (Cardiology)  CHIEF COMPLAINT: CLL, severe anemia and thrombocytopenia.  INTERVAL HISTORY: Patient is a 77 year old male with a known history of CLL who presented with increasing weakness and fatigue and chest pain.  Subsequent work-up revealed a hemoglobin of less than 6 as well as a platelet count of less than 10.  His type and screen revealed warm antibodies.  He currently feels improved since admission, but not back to baseline.  He has no neurologic complaints.  He denies any recent fevers or illnesses.  He has a good appetite and denies weight loss.  He denies any further chest pain, but has significant dyspnea on exertion.  He denies any hemoptysis.  He has no nausea, vomiting, constipation, or diarrhea.  He has dark stools, but denies any hematochezia.  He has no urinary complaints.  Patient otherwise feels well and offers no further specific complaints.  REVIEW OF SYSTEMS:   Review of Systems  Constitutional: Positive for malaise/fatigue. Negative for fever and weight loss.  Respiratory: Positive for shortness of breath. Negative for cough.   Cardiovascular: Positive for chest pain. Negative for leg swelling.  Gastrointestinal: Positive for melena. Negative for abdominal pain and blood in stool.  Genitourinary: Negative.  Negative for hematuria.  Musculoskeletal: Negative.   Skin: Positive for rash.  Neurological: Negative.  Negative for sensory change, speech change and weakness.  Psychiatric/Behavioral: Negative.  The patient is not  nervous/anxious.     As per HPI. Otherwise, a complete review of systems is negative.  PAST MEDICAL HISTORY: Past Medical History:  Diagnosis Date  . Acquired hand deformity 1962   hand saw accident at work  . Anemia 10/11/2011  . Arthritis   . Bradycardia 10/10/2011  . CAD (coronary artery disease) 10/10/2011   MI s/p PTCA (Dx-OM2 proximal concentric stenosis)  . CKD (chronic kidney disease) stage 3, GFR 30-59 ml/min (HCC)    [name]  . CLL (chronic lymphocytic leukemia) ([place])   . Colon polyps   . Generalized headaches    frequent  . Glaucoma    s/p laser surgery  . HLD (hyperlipidemia)   . Hypertension   . Hypothyroidism 10/10/2011  . Thrombocytopenia ([place]) 10/11/2011  . Type 2 diabetes with nephropathy [facility])    DM refresher course [facility] (04/2013)    PAST SURGICAL HISTORY: Past Surgical History:  Procedure Laterality Date  . BACK SURGERY     cervical neck  . CATARACT EXTRACTION, BILATERAL Bilateral 2017  . COLONOSCOPY  11/2008   1 polyp, diverticulosis, rec rpt 5 yrs (Dr. Oletta Lamas, [name])  . COLONOSCOPY  06/2014   hyperplastic polyp, rpt 5 yrs ([place])  . EYE SURGERY  2012   laser surgery for glaucoma  . PTCA  1994, 1995  . US ECHOCARDIOGRAPHY  10/2013   inferior wall hypokinesis, mild LVH, EF 50-55%, mild MR and LA dilation    FAMILY HISTORY: Family History  Problem Relation Age of Onset  . Diabetes Sister   . Stomach cancer Sister 48  . Pneumonia Father        caused death  . Other Brother        no communication with  brother so unsure of any health conditions  . Coronary artery disease Son 41       5v CABG and stents  . Hyperlipidemia Sister   . Stroke Neg Hx   . Heart attack Neg Hx     ADVANCED DIRECTIVES (Y/N):  @ADVDIR @  HEALTH MAINTENANCE: Social History   Tobacco Use  . Smoking status: Former Smoker    Last attempt to quit: 03/04/1978    Years since quitting: 39.3  . Smokeless tobacco: Never Used  Substance Use Topics  . Alcohol use: No  . Drug  use: No     Colonoscopy:  PAP:  Bone density:  Lipid panel:  No Known Allergies  Current Facility-Administered Medications  Medication Dose Route Frequency Provider Last Rate Last Dose  . acetaminophen (TYLENOL) tablet 650 mg  650 mg Oral Q6H PRN Hillary Bow, MD       Or  . acetaminophen (TYLENOL) suppository 650 mg  650 mg Rectal Q6H PRN Sudini, Srikar, MD      . albuterol (PROVENTIL) (2.5 MG/3ML) 0.083% nebulizer solution 2.5 mg  2.5 mg Nebulization Q2H PRN Sudini, Srikar, MD      . insulin aspart (novoLOG) injection 0-15 Units  0-15 Units Subcutaneous TID WC Pyreddy, Pavan, MD      . insulin aspart (novoLOG) injection 0-5 Units  0-5 Units Subcutaneous QHS Pyreddy, Pavan, MD      . insulin detemir (LEVEMIR) injection 12 Units  12 Units Subcutaneous Daily Saundra Shelling, MD   12 Units at 07/09/17 1208  . ondansetron (ZOFRAN) tablet 4 mg  4 mg Oral Q6H PRN Hillary Bow, MD       Or  . ondansetron (ZOFRAN) injection 4 mg  4 mg Intravenous Q6H PRN Sudini, Srikar, MD      . oxyCODONE (Oxy IR/ROXICODONE) immediate release tablet 5 mg  5 mg Oral Q4H PRN Sudini, Srikar, MD      . oxymetazoline (AFRIN) 0.05 % nasal spray 1 spray  1 spray Each Nare QHS PRN Awilda Bill, NP      . pantoprazole (PROTONIX) 80 mg in sodium chloride 0.9 % 250 mL (0.32 mg/mL) infusion  8 mg/hr Intravenous Continuous Hillary Bow, MD 25 mL/hr at 07/09/17 0743 8 mg/hr at 07/09/17 0743  . [START ON 07/12/2017] pantoprazole (PROTONIX) injection 40 mg  40 mg Intravenous Q12H Sudini, Srikar, MD      . sodium chloride flush (NS) 0.9 % injection 3 mL  3 mL Intravenous Q12H Sudini, Srikar, MD   3 mL at 07/09/17 1111    OBJECTIVE: Vitals:   07/09/17 1211 07/09/17 1300  BP: (!) 150/69 139/60  Pulse: 64 67  Resp: 19 17  Temp:    SpO2: 100% 100%     Body mass index is 23.28 kg/m.    ECOG FS:2 - Symptomatic, <50% confined to bed  General: Well-developed, well-nourished, no acute distress. Eyes: Pink  conjunctiva, anicteric sclera. HEENT: Normocephalic, moist mucous membranes, clear oropharnyx. Lungs: Clear to auscultation bilaterally. Heart: Regular rate and rhythm. No rubs, murmurs, or gallops. Abdomen: Soft, nontender, nondistended. No organomegaly noted, normoactive bowel sounds. Musculoskeletal: No edema, cyanosis, or clubbing. Neuro: Alert, answering all questions appropriately. Cranial nerves grossly intact. Skin: No rashes or petechiae noted. Psych: Normal affect. Lymphatics: No cervical, calvicular, axillary or inguinal LAD.   LAB RESULTS:  Lab Results  Component Value Date   NA 143 07/09/2017   K 4.2 07/09/2017   CL 115 (H) 07/09/2017   CO2 22 07/09/2017  GLUCOSE 250 (H) 07/09/2017   BUN 34 (H) 07/09/2017   CREATININE 1.64 (H) 07/09/2017   CALCIUM 8.8 (L) 07/09/2017   PROT 5.7 (L) 07/08/2017   ALBUMIN 3.7 07/08/2017   AST 22 07/08/2017   ALT 13 (L) 07/08/2017   ALKPHOS 53 07/08/2017   BILITOT 0.8 07/08/2017   GFRNONAA 39 (L) 07/09/2017   GFRAA 45 (L) 07/09/2017    Lab Results  Component Value Date   WBC 28.4 (H) 07/09/2017   NEUTROABS 5.8 07/08/2017   HGB 5.2 (L) 07/09/2017   HCT 16.7 (L) 07/09/2017   MCV 110.8 (H) 07/09/2017   PLT 12 (LL) 07/09/2017     STUDIES: Dg Chest 2 View  Result Date: 07/08/2017 CLINICAL DATA:  Chest pain EXAM: CHEST - 2 VIEW COMPARISON:  October 21, 2015 FINDINGS: The heart size and mediastinal contours are within normal limits. Both lungs are clear. The visualized skeletal structures are unremarkable. IMPRESSION: No active cardiopulmonary disease. Electronically Signed   By: Dorise Bullion III M.D   On: 07/08/2017 15:35    ASSESSMENT: CLL, severe anemia and thrombocytopenia.  PLAN:    1.  CLL: Patient's white blood cell count appears to be approximately his baseline at 28.4.  The warm antibody seen on type and screen may be secondary to his underlying CLL.  Patient was given 70 mg of prednisone daily or approximately 1  mg/kg.  Continue to monitor daily CBC. 2.  Anemia: No evidence of TTP or DIC.  Although patient has a normal LDH and bilirubin, he still may have a low level hemolysis given the warm antibody seen in type and screen.  Prednisone as above and proceed with blood transfusion as ordered.  Patient also noted to have iron deficiency and possible heme positive stools.  Appreciate GI input and patient will need luminal evaluation once his platelets have improved. 3.  Thrombocytopenia: Possibly multifactorial.  May be consumptive secondary to GI bleed, but patient may also have a component of ITP secondary to his CLL.  This is not TTP or DIC as above.  Continue prednisone approximately 1 mg/kg.  Patient only had mild improvement with platelet transfusion.  Monitor.  Appreciate consult, will follow.    Patient expressed understanding and was in agreement with this plan. He also understands that He can call clinic at any time with any questions, concerns, or complaints.    Lloyd Huger, MD   07/09/2017 1:48 PM

## 2017-07-09 NOTE — Progress Notes (Signed)
Notified Dr. [name] of new onset of itching and rash on left leg and back during blood transfusion. Ordered to give 37.5 mg of Benadryl and continue blood administration. No complaints of SOB or chest pain. Vital signs stable. Will continue to monitor closely.

## 2017-07-09 NOTE — Progress Notes (Signed)
Scott [name] at [facility] NAME: Scott [name]    MR#:  [id number]  DATE OF BIRTH:  1941-01-20  SUBJECTIVE:  Patient seen and evaluated today Is lying down comfortably Has on and off chest pain No blood in the stool No vomiting of blood   REVIEW OF SYSTEMS:    ROS  CONSTITUTIONAL: No documented fever. Has fatigue, weakness. No weight gain, no weight loss.  EYES: No blurry or double vision.  ENT: No tinnitus. No postnasal drip. No redness of the oropharynx.  RESPIRATORY: No cough, no wheeze, no hemoptysis. No dyspnea.  CARDIOVASCULAR: No chest pain. No orthopnea. No palpitations. No syncope.  GASTROINTESTINAL: No nausea, no vomiting or diarrhea. No abdominal pain. No melena or hematochezia.  GENITOURINARY: No dysuria or hematuria.  ENDOCRINE: No polyuria or nocturia. No heat or cold intolerance.  HEMATOLOGY: Has anemia. No bruising. No bleeding.  INTEGUMENTARY: No rashes. No lesions.  MUSCULOSKELETAL: No arthritis. No swelling. No gout.  NEUROLOGIC: No numbness, tingling, or ataxia. No seizure-type activity.  PSYCHIATRIC: No anxiety. No insomnia. No ADD.   DRUG ALLERGIES:  No Known Allergies  VITALS:  Blood pressure 139/60, pulse 67, temperature 98.6 F (37 C), temperature source Axillary, resp. rate 17, height 5\' 10"  (1.778 m), weight 73.6 kg (162 lb 4.1 oz), SpO2 100 %.  PHYSICAL EXAMINATION:   Physical Exam  GENERAL:  77 y.o.-year-old patient lying in the bed with no acute distress.  EYES: Pupils equal, round, reactive to light and accommodation. No scleral icterus. Extraocular muscles intact. Pallor present HEENT: Head atraumatic, normocephalic. Oropharynx and nasopharynx clear.  NECK:  Supple, no jugular venous distention. No thyroid enlargement, no tenderness.  LUNGS: Normal breath sounds bilaterally, no wheezing, rales, rhonchi. No use of accessory muscles of respiration.  CARDIOVASCULAR: S1, S2 normal. No murmurs, rubs, or  gallops.  ABDOMEN: Soft, nontender, nondistended. Bowel sounds present. No organomegaly or mass.  EXTREMITIES: No cyanosis, clubbing or edema b/l.    NEUROLOGIC: Cranial nerves II through XII are intact. No focal Motor or sensory deficits b/l.   PSYCHIATRIC: The patient is alert and oriented x 3.  SKIN: No obvious rash, lesion, or ulcer.   LABORATORY PANEL:   CBC Recent Labs  Lab 07/09/17 0513  WBC 28.4*  HGB 5.2*  HCT 16.7*  PLT 12*   ------------------------------------------------------------------------------------------------------------------ Chemistries  Recent Labs  Lab 07/08/17 1605 07/09/17 0513  NA  --  143  K  --  4.2  CL  --  115*  CO2  --  22  GLUCOSE  --  250*  BUN  --  34*  CREATININE  --  1.64*  CALCIUM  --  8.8*  AST 22  --   ALT 13*  --   ALKPHOS 53  --   BILITOT 0.8  --    ------------------------------------------------------------------------------------------------------------------  Cardiac Enzymes Recent Labs  Lab 07/08/17 1439  TROPONINI 0.04*   ------------------------------------------------------------------------------------------------------------------  RADIOLOGY:  Dg Chest 2 View  Result Date: 07/08/2017 CLINICAL DATA:  Chest pain EXAM: CHEST - 2 VIEW COMPARISON:  October 21, 2015 FINDINGS: The heart size and mediastinal contours are within normal limits. Both lungs are clear. The visualized skeletal structures are unremarkable. IMPRESSION: No active cardiopulmonary disease. Electronically Signed   By: Dorise Bullion III M.D   On: 07/08/2017 15:35     ASSESSMENT AND PLAN:  77 year old male patient with history of CLL, diabetes mellitus, hypertension currently in stepdown unit for severe low platelets and anemia.  -Symptomatic  acute anemia secondary to CLL PRBC transfusion IV only after blood is available as patient has rare antibodies Working on acquiring blood for transfusion Status post gastroenterology  evaluation Endoscopy deferred  -Severe thrombocytopenia Platelet transfusion intravenously completed Monitor for spontaneous bleeds  -Chronic lymphocytic leukemia Oncology follow-up  -Musculoskeletal chest pain Bump in troponin secondary to demand ischemia   All the records are reviewed and case discussed with Care Management/Social Worker. Management plans discussed with the patient, family and they are in agreement.  CODE STATUS: DNR  DVT Prophylaxis: SCDs  TOTAL TIME TAKING CARE OF THIS PATIENT: 36 minutes.   POSSIBLE D/C IN 2 to 3 DAYS, DEPENDING ON CLINICAL CONDITION.  Saundra Shelling M.D on 07/09/2017 at 4:05 PM  Between 7am to 6pm - Pager - [phone]  After 6pm go to [email or web address] - password [email or web address] [facility]  Office  [phone]  CC: Primary care physician; [name], MD  Note: This dictation was prepared with Dragon dictation along with smaller phrase technology. Any transcriptional errors that result from this process are unintentional.

## 2017-07-09 NOTE — Consult Note (Signed)
Scott [name] , MD [address] [place], [place], [place], [address] [address], [place], [place], [place], [address] Phone: [phone]  Fax: [phone]  Consultation  Referring Provider:   Dr Estanislado Pandy  Primary Care Physician:  [name], MD Primary Gastroenterologist:  None         Reason for Consultation:     Anemia   Date of Admission:  07/08/2017 Date of Consultation:  07/09/2017         HPI:   Scott [name] is a 77 y.o. male with a known history of CLL presented to the ER with shortness of breath , chest pains. In the ER he was noted to have a Hb of 5.5 grams and a platelet count of 8 . No overt blood loss noted. He has been on iron tablets. In terms of his CLL last seen by Oncology 10/2016 and at that time was not on any treatment . 4 months back his Hb was 11.6 grams with an elevated WCC which dropped to 9.2 grams 2 months back. Slight worsening of renal function as well.On admission mildly elevated troponin   He denies use of any NSAID's, no hematemesis, does have dark colored stools but has been on iron for long. Denies any abdominal pain.   Past Medical History:  Diagnosis Date  . Acquired hand deformity 1962   hand saw accident at work  . Anemia 10/11/2011  . Arthritis   . Bradycardia 10/10/2011  . CAD (coronary artery disease) 10/10/2011   MI s/p PTCA (Dx-OM2 proximal concentric stenosis)  . CKD (chronic kidney disease) stage 3, GFR 30-59 ml/min (HCC)    [name]  . CLL (chronic lymphocytic leukemia) ([facility])   . Colon polyps   . Generalized headaches    frequent  . Glaucoma    s/p laser surgery  . HLD (hyperlipidemia)   . Hypertension   . Hypothyroidism 10/10/2011  . Thrombocytopenia ([facility]) 10/11/2011  . Type 2 diabetes with nephropathy [facility])    DM refresher course [facility] (04/2013)    Past Surgical History:  Procedure Laterality Date  . BACK SURGERY     cervical neck  . CATARACT EXTRACTION, BILATERAL Bilateral 2017  . COLONOSCOPY  11/2008   1 polyp,  diverticulosis, rec rpt 5 yrs (Dr. Oletta Lamas, [name])  . COLONOSCOPY  06/2014   hyperplastic polyp, rpt 5 yrs ([facility])  . EYE SURGERY  2012   laser surgery for glaucoma  . PTCA  1994, 1995  . US ECHOCARDIOGRAPHY  10/2013   inferior wall hypokinesis, mild LVH, EF 50-55%, mild MR and LA dilation    Prior to Admission medications   Medication Sig Start Date End Date Taking? Authorizing Provider  aspirin EC 81 MG tablet Take 1 tablet (81 mg total) by mouth daily. 02/01/16  Yes Dorothy Spark, MD  atorvastatin (LIPITOR) 80 MG tablet Take 1 tablet (80 mg total) by mouth daily. 05/06/16  Yes Dorothy Spark, MD  carvedilol (COREG) 6.25 MG tablet TAKE 1 TABLET BY MOUTH TWICE DAILY 05/08/17  Yes Dorothy Spark, MD  ferrous sulfate 325 (65 FE) MG tablet Take 325 mg by mouth daily with breakfast.   Yes [provider]  furosemide (LASIX) 20 MG tablet Take 1 tablet (20 mg total) by mouth daily. 07/02/17 09/30/17 Yes Dorothy Spark, MD  insulin aspart (NOVOLOG) 100 UNIT/ML FlexPen Inject 3-7 Units into the skin 3 (three) times daily with meals.    Yes [provider]  Insulin Detemir (  LEVEMIR FLEXTOUCH) 100 UNIT/ML Pen Inject 25 Units into the skin daily at 10 pm. 02/17/17  Yes [name], MD  levothyroxine (SYNTHROID, LEVOTHROID) 50 MCG tablet TAKE 1 TABLET BY MOUTH ONCE DAILY 05/27/17  Yes [name], MD  lisinopril (PRINIVIL,ZESTRIL) 10 MG tablet Take 1 tablet (10 mg total) by mouth daily. 07/02/17 09/30/17 Yes Dorothy Spark, MD  niacin (SLO-NIACIN) 500 MG tablet Take 1,000 mg by mouth at bedtime.    Yes [provider]  traZODone (DESYREL) 50 MG tablet TAKE 1/2 TO 1 (ONE-HALF TO ONE) TABLET BY MOUTH AT BEDTIME AS NEEDED FOR SLEEP 06/18/17  Yes [name], MD  acetaminophen (TYLENOL) 650 MG CR tablet Take 650 mg by mouth every 8 (eight) hours as needed for pain.    [provider]  diclofenac sodium (VOLTAREN) 1 % GEL Apply topically 4 (four)  times daily.    [provider]  glucose blood (ONE TOUCH ULTRA TEST) test strip USE ONE STRIP TO CHECK GLUCOSE TWICE DAILY AND  AS  NEEDED. Dx code:  E11.21 04/08/17   [name], MD  nitroGLYCERIN (NITROSTAT) 0.4 MG SL tablet Place 1 tablet (0.4 mg total) under the tongue every 5 (five) minutes as needed for chest pain ((MAX of 3 doses)). 05/06/16   Dorothy Spark, MD    Family History  Problem Relation Age of Onset  . Diabetes Sister   . Stomach cancer Sister 64  . Pneumonia Father        caused death  . Other Brother        no communication with brother so unsure of any health conditions  . Coronary artery disease Son 56       5v CABG and stents  . Hyperlipidemia Sister   . Stroke Neg Hx   . Heart attack Neg Hx      Social History   Tobacco Use  . Smoking status: Former Smoker    Last attempt to quit: 03/04/1978    Years since quitting: 39.3  . Smokeless tobacco: Never Used  Substance Use Topics  . Alcohol use: No  . Drug use: No    Allergies as of 07/08/2017  . (No Known Allergies)    Review of Systems:    All systems reviewed and negative except where noted in HPI.   Physical Exam:  Vital signs in last 24 hours: Temp:  [97.5 F (36.4 C)-98.5 F (36.9 C)] 97.5 F (36.4 C) (05/08 0751) Pulse Rate:  [54-101] 82 (05/08 0700) Resp:  [10-21] 21 (05/08 0700) BP: (122-157)/(50-86) 139/64 (05/08 0700) SpO2:  [97 %-100 %] 98 % (05/08 0700) Weight:  [162 lb 4.1 oz (73.6 kg)-164 lb (74.4 kg)] 162 lb 4.1 oz (73.6 kg) (05/08 0500)   General:   Pleasant, cooperative in NAD Head:  Normocephalic and atraumatic. Eyes:   No icterus.   Conjunctiva pink. PERRLA. Ears:  Normal auditory acuity. Neck:  Supple; no masses or thyroidomegaly Lungs: Respirations even and unlabored. Lungs clear to auscultation bilaterally.   No wheezes, crackles, or rhonchi.  Heart:  Regular rate and rhythm;  Without murmur, clicks, rubs or gallops Abdomen:  Soft, nondistended,  nontender. Normal bowel sounds. No appreciable masses or hepatomegaly.  No rebound or guarding.  Neurologic:  Alert and oriented x3;  grossly normal neurologically. Skin:  Intact without significant lesions or rashes. Cervical Nodes:  No significant cervical adenopathy. Psych:  Alert and cooperative. Normal affect.  LAB RESULTS: Recent Labs    07/08/17 1439 07/09/17  0513  WBC 36.3* 28.4*  HGB 5.6* 5.2*  HCT 18.5* 16.7*  PLT 8* 12*   BMET Recent Labs    07/08/17 1439 07/09/17 0513  NA 142 143  K 4.3 4.2  CL 114* 115*  CO2 22 22  GLUCOSE 204* 250*  BUN 35* 34*  CREATININE 1.64* 1.64*  CALCIUM 9.2 8.8*   LFT Recent Labs    07/08/17 1605  PROT 5.7*  ALBUMIN 3.7  AST 22  ALT 13*  ALKPHOS 53  BILITOT 0.8  BILIDIR 0.1  IBILI 0.7   PT/INR Recent Labs    07/08/17 1605  LABPROT 14.1  INR 1.10    STUDIES: Dg Chest 2 View  Result Date: 07/08/2017 CLINICAL DATA:  Chest pain EXAM: CHEST - 2 VIEW COMPARISON:  October 21, 2015 FINDINGS: The heart size and mediastinal contours are within normal limits. Both lungs are clear. The visualized skeletal structures are unremarkable. IMPRESSION: No active cardiopulmonary disease. Electronically Signed   By: Dorise Bullion III M.D   On: 07/08/2017 15:35      Impression / Plan:   CAMARI WISHAM is a 77 y.o. y/o male admitted with chest pain , fatigue with a known history of CLL on not any treatment and found to have a low Hb of 5.5 grams, some dark stool but been on iron. No frank blood noted in stool. Presently Hb 5.2 grams with platelet count of 12.   Plan  1. PPI 2. With a low platelet count he could have oozing of blood anywhere from the mouth to the anus, urinary tract - a stool test showing blood does not indicate an active bleed or a cause for the anemia. At this time his Hb and platelet counts are very low to perform any form of endoscopy as the procedure itself may cause trauma and bleeding . It is very possible that his  anemia is also secondary to his CLL which we would need more information from Hematology.  3. He has renal failure, thrombocytopenia- ?TTP, ? Autoimmune hemolytic anemia /hemolysis which is seen in CLL patients 4. Check b12, folate ,iron studies , haptoglobulin, reticulocyte count, urineanalysis,LDH  Thank you for involving me in the care of this patient.      LOS: 1 day   Scott Bellows, MD  07/09/2017, 8:19 AM

## 2017-07-10 DIAGNOSIS — I2510 Atherosclerotic heart disease of native coronary artery without angina pectoris: Secondary | ICD-10-CM

## 2017-07-10 DIAGNOSIS — Z87891 Personal history of nicotine dependence: Secondary | ICD-10-CM

## 2017-07-10 DIAGNOSIS — E039 Hypothyroidism, unspecified: Secondary | ICD-10-CM

## 2017-07-10 DIAGNOSIS — C9110 Chronic lymphocytic leukemia of B-cell type not having achieved remission: Principal | ICD-10-CM

## 2017-07-10 DIAGNOSIS — N183 Chronic kidney disease, stage 3 (moderate): Secondary | ICD-10-CM

## 2017-07-10 DIAGNOSIS — R079 Chest pain, unspecified: Secondary | ICD-10-CM

## 2017-07-10 DIAGNOSIS — I10 Essential (primary) hypertension: Secondary | ICD-10-CM

## 2017-07-10 LAB — CBC
HEMATOCRIT: 26.8 % — AB (ref 40.0–52.0)
Hemoglobin: 8.3 g/dL — ABNORMAL LOW (ref 13.0–18.0)
MCH: 30.4 pg (ref 26.0–34.0)
MCHC: 31 g/dL — ABNORMAL LOW (ref 32.0–36.0)
MCV: 98.2 fL (ref 80.0–100.0)
PLATELETS: 5 10*3/uL — AB (ref 150–440)
RBC: 2.73 MIL/uL — AB (ref 4.40–5.90)
RDW: 24.2 % — ABNORMAL HIGH (ref 11.5–14.5)
WBC: 49.1 10*3/uL — AB (ref 3.8–10.6)

## 2017-07-10 LAB — BASIC METABOLIC PANEL
Anion gap: 11 (ref 5–15)
BUN: 32 mg/dL — ABNORMAL HIGH (ref 6–20)
CHLORIDE: 114 mmol/L — AB (ref 101–111)
CO2: 19 mmol/L — AB (ref 22–32)
CREATININE: 1.6 mg/dL — AB (ref 0.61–1.24)
Calcium: 8.8 mg/dL — ABNORMAL LOW (ref 8.9–10.3)
GFR calc non Af Amer: 40 mL/min — ABNORMAL LOW (ref >60)
GFR, EST AFRICAN AMERICAN: 46 mL/min — AB (ref >60)
Glucose, Bld: 254 mg/dL — ABNORMAL HIGH (ref 65–99)
POTASSIUM: 4.4 mmol/L (ref 3.5–5.1)
SODIUM: 144 mmol/L (ref 135–145)

## 2017-07-10 LAB — GLUCOSE, CAPILLARY
GLUCOSE-CAPILLARY: 394 mg/dL — AB (ref 65–99)
Glucose-Capillary: 235 mg/dL — ABNORMAL HIGH (ref 65–99)
Glucose-Capillary: 382 mg/dL — ABNORMAL HIGH (ref 65–99)
Glucose-Capillary: 314 mg/dL — ABNORMAL HIGH (ref 65–99)
Glucose-Capillary: 248 mg/dL — ABNORMAL HIGH (ref 65–99)

## 2017-07-10 LAB — PREPARE RBC (CROSSMATCH)

## 2017-07-10 LAB — HAPTOGLOBIN: Haptoglobin: 56 mg/dL (ref 34–200)

## 2017-07-10 MED ORDER — ACETAMINOPHEN 325 MG PO TABS
650.0000 mg | ORAL_TABLET | Freq: Once | ORAL | Status: AC
  Administered 2017-07-10: 650 mg via ORAL
  Filled 2017-07-10: qty 2

## 2017-07-10 MED ORDER — INSULIN DETEMIR 100 UNIT/ML ~~LOC~~ SOLN
10.0000 [IU] | Freq: Every day | SUBCUTANEOUS | Status: DC
  Administered 2017-07-10: 10 [IU] via SUBCUTANEOUS
  Filled 2017-07-10 (×2): qty 0.1

## 2017-07-10 MED ORDER — FUROSEMIDE 20 MG PO TABS
20.0000 mg | ORAL_TABLET | Freq: Every day | ORAL | Status: DC
  Administered 2017-07-10 – 2017-07-15 (×6): 20 mg via ORAL
  Filled 2017-07-10 (×6): qty 1

## 2017-07-10 MED ORDER — CARVEDILOL 3.125 MG PO TABS
6.2500 mg | ORAL_TABLET | Freq: Two times a day (BID) | ORAL | Status: DC
  Administered 2017-07-10 – 2017-07-15 (×10): 6.25 mg via ORAL
  Filled 2017-07-10: qty 2
  Filled 2017-07-10: qty 1
  Filled 2017-07-10 (×2): qty 2
  Filled 2017-07-10 (×2): qty 1
  Filled 2017-07-10: qty 2
  Filled 2017-07-10: qty 1
  Filled 2017-07-10 (×2): qty 2

## 2017-07-10 MED ORDER — NITROGLYCERIN 0.4 MG SL SUBL: 0.4000 mg | SUBLINGUAL_TABLET | SUBLINGUAL | Status: DC | PRN

## 2017-07-10 MED ORDER — DIPHENHYDRAMINE HCL 25 MG PO CAPS
25.0000 mg | ORAL_CAPSULE | Freq: Once | ORAL | Status: AC
  Administered 2017-07-10: 25 mg via ORAL
  Filled 2017-07-10: qty 1

## 2017-07-10 MED ORDER — DIPHENHYDRAMINE HCL 50 MG/ML IJ SOLN: 25.0000 mg | Freq: Once | INTRAMUSCULAR | Status: DC

## 2017-07-10 MED ORDER — LEVOTHYROXINE SODIUM 50 MCG PO TABS
50.0000 ug | ORAL_TABLET | Freq: Every day | ORAL | Status: DC
  Administered 2017-07-10 – 2017-07-15 (×6): 50 ug via ORAL
  Filled 2017-07-10 (×6): qty 1

## 2017-07-10 MED ORDER — TRAZODONE HCL 50 MG PO TABS
50.0000 mg | ORAL_TABLET | Freq: Every evening | ORAL | Status: DC | PRN
  Administered 2017-07-14 (×2): 50 mg via ORAL
  Filled 2017-07-10 (×2): qty 1

## 2017-07-10 MED ORDER — SODIUM CHLORIDE 0.9 % IV SOLN
Freq: Once | INTRAVENOUS | Status: AC
  Administered 2017-07-10: 23:00:00 via INTRAVENOUS

## 2017-07-10 NOTE — Progress Notes (Signed)
Follow up - Critical Care Medicine Note  Patient Details:    OSHUA [name] is an 77 y.o. male.with history of CLL, anemia, thrombocytopenia presented with severe anemia, difficult blood match secondary to auto antibodies, status post transfusion of 2 units packed red blood cells  Lines, Airways, Drains:    Anti-infectives:  Anti-infectives (From admission, onward)   None      Microbiology: Results for orders placed or performed during the hospital encounter of 07/08/17  MRSA PCR Screening     Status: None   Collection Time: 07/08/17 10:00 PM  Result Value Ref Range Status   MRSA by PCR NEGATIVE NEGATIVE Final    Comment:        The GeneXpert MRSA Assay (FDA approved for NASAL specimens only), is one component of a comprehensive MRSA colonization surveillance program. It is not intended to diagnose MRSA infection nor to guide or monitor treatment for MRSA infections. Performed at [facility], [address] [place], [place] [address]     Studies: Dg Chest 2 View  Result Date: 07/08/2017 CLINICAL DATA:  Chest pain EXAM: CHEST - 2 VIEW COMPARISON:  October 21, 2015 FINDINGS: The heart size and mediastinal contours are within normal limits. Both lungs are clear. The visualized skeletal structures are unremarkable. IMPRESSION: No active cardiopulmonary disease. Electronically Signed   By: Dorise Bullion III M.D   On: 07/08/2017 15:35    Consults: Treatment Team:  Pccm, Ander Gaster, MD Jonathon Bellows, MD Lloyd Huger, MD   Subjective:    Overnight Issues: patient received 2 units of blood, did have a hive reaction to the blood, received Benadryl  Objective:  Vital signs for last 24 hours: Temp:  [97.5 F (36.4 C)-98.6 F (37 C)] 98.4 F (36.9 C) (05/09 0400) Pulse Rate:  [48-73] 48 (05/09 0600) Resp:  [13-22] 14 (05/09 0600) BP: (123-165)/(58-94) 141/68 (05/09 0600) SpO2:  [95 %-100 %] 99 % (05/09 0600) Weight:  [164 lb 14.5 oz (74.8 kg)] 164 lb  14.5 oz (74.8 kg) (05/09 0347)  Hemodynamic parameters for last 24 hours:    Intake/Output from previous day: 05/08 0701 - 05/09 0700 In: 1188.8 [I.V.:498.8; Blood:690] Out: 1640 [[id number]]  Intake/Output this shift: No intake/output data recorded.  Vent settings for last 24 hours:    Physical Exam:  Patient is awake, alert, oriented in no acute distress Vital signs: Please see the above listed vital signs Cardiovascular: Regular rate and rhythm Pulmonary: Clear to auscultation Abdominal: Positive bowel sounds, soft exam Extremities: Amputations noted no clubbing cyanosis or edema Neurologic: No focal deficits appreciated  Assessment/Plan:   Severe anemia. Status post transfusion of 2 units packed red blood cells. Pending repeat H&H, seen by gastroenterology yesterday do not feel active GI bleeding or emergent need for endoscopy. If H&H stable will transfer to the floor  Thrombocytopenia status post platelet transfusion  CLL being followed by hematology oncology for consideration for therapy  Scott [name] 07/10/2017  *Care during the described time interval was provided by me and/or other providers on the critical care team.  I have reviewed this patient's available data, including medical history, events of note, physical examination and test results as part of my evaluation.

## 2017-07-10 NOTE — Progress Notes (Signed)
[place]  [phone] [phone]) [phone]  ID: Scott [name] OB: 05-15-1940  MR#: [id number]  JEH#:[id number]  Patient Care Team: [name], MD as PCP - General (Family Medicine) Lloyd Huger, MD as Consulting Physician (Oncology) Clent Jacks, MD as Consulting Physician (Ophthalmology) Fleet Contras, MD as Consulting Physician (Nephrology) Dorothy Spark, MD as Consulting Physician (Cardiology)  CHIEF COMPLAINT: CLL, severe anemia and thrombocytopenia  INTERVAL HISTORY: Patient more tired today, but otherwise feels well and offers no specific complaints. Mild reaction to blood transfusion last night, but after 25 mg IV benadryl, patient completed the transfusion without incident.  Platelets decreased today.  REVIEW OF SYSTEMS:   Review of Systems  Constitutional: Positive for malaise/fatigue. Negative for fever and weight loss.  Respiratory: Negative.  Negative for cough and shortness of breath.   Cardiovascular: Negative.  Negative for chest pain and leg swelling.  Gastrointestinal: Negative.  Negative for abdominal pain, blood in stool and melena.  Genitourinary: Negative.   Musculoskeletal: Negative.   Skin: Negative.  Negative for rash.  Neurological: Positive for weakness. Negative for sensory change and focal weakness.  Psychiatric/Behavioral: Negative.  The patient is not nervous/anxious.     As per HPI. Otherwise, a complete review of systems is negative.  PAST MEDICAL HISTORY: Past Medical History:  Diagnosis Date  . Acquired hand deformity 1962   hand saw accident at work  . Anemia 10/11/2011  . Arthritis   . Bradycardia 10/10/2011  . CAD (coronary artery disease) 10/10/2011   MI s/p PTCA (Dx-OM2 proximal concentric stenosis)  . CKD (chronic kidney disease) stage 3, GFR 30-59 ml/min (HCC)    [name]  . CLL (chronic lymphocytic leukemia) ([place])   . Colon polyps   . Generalized headaches    frequent  .  Glaucoma    s/p laser surgery  . HLD (hyperlipidemia)   . Hypertension   . Hypothyroidism 10/10/2011  . Thrombocytopenia ([place]) 10/11/2011  . Type 2 diabetes with nephropathy [facility])    DM refresher course [facility] (04/2013)    PAST SURGICAL HISTORY: Past Surgical History:  Procedure Laterality Date  . BACK SURGERY     cervical neck  . CATARACT EXTRACTION, BILATERAL Bilateral 2017  . COLONOSCOPY  11/2008   1 polyp, diverticulosis, rec rpt 5 yrs (Dr. Oletta Lamas, [name])  . COLONOSCOPY  06/2014   hyperplastic polyp, rpt 5 yrs ([place])  . EYE SURGERY  2012   laser surgery for glaucoma  . PTCA  1994, 1995  . US ECHOCARDIOGRAPHY  10/2013   inferior wall hypokinesis, mild LVH, EF 50-55%, mild MR and LA dilation    FAMILY HISTORY: Family History  Problem Relation Age of Onset  . Diabetes Sister   . Stomach cancer Sister 6  . Pneumonia Father        caused death  . Other Brother        no communication with brother so unsure of any health conditions  . Coronary artery disease Son 32       5v CABG and stents  . Hyperlipidemia Sister   . Stroke Neg Hx   . Heart attack Neg Hx     ADVANCED DIRECTIVES (Y/N):  _0 @  HEALTH MAINTENANCE: Social History   Tobacco Use  . Smoking status: Former Smoker    Last attempt to quit: 03/04/1978    Years since quitting: 39.3  . Smokeless tobacco: Never Used  Substance Use Topics  . Alcohol use: No  . Drug use: No  Colonoscopy:  PAP:  Bone density:  Lipid panel:  No Known Allergies  Current Facility-Administered Medications  Medication Dose Route Frequency Provider Last Rate Last Dose  . acetaminophen (TYLENOL) tablet 650 mg  650 mg Oral Q6H PRN Hillary Bow, MD       Or  . acetaminophen (TYLENOL) suppository 650 mg  650 mg Rectal Q6H PRN Sudini, Srikar, MD      . albuterol (PROVENTIL) (2.5 MG/3ML) 0.083% nebulizer solution 2.5 mg  2.5 mg Nebulization Q2H PRN Sudini, Srikar, MD      . carvedilol (COREG) tablet 6.25 mg  6.25 mg Oral  BID Sudini, Srikar, MD      . furosemide (LASIX) tablet 20 mg  20 mg Oral Daily Sudini, Srikar, MD      . insulin aspart (novoLOG) injection 0-15 Units  0-15 Units Subcutaneous TID WC Saundra Shelling, MD   3 Units at 07/09/17 1652  . insulin aspart (novoLOG) injection 0-5 Units  0-5 Units Subcutaneous QHS Pyreddy, Pavan, MD      . insulin detemir (LEVEMIR) injection 10 Units  10 Units Subcutaneous Q2200 Sudini, Srikar, MD      . insulin detemir (LEVEMIR) injection 12 Units  12 Units Subcutaneous Daily Saundra Shelling, MD   12 Units at 07/09/17 1208  . levothyroxine (SYNTHROID, LEVOTHROID) tablet 50 mcg  50 mcg Oral QAC breakfast Sudini, Srikar, MD      . nitroGLYCERIN (NITROSTAT) SL tablet 0.4 mg  0.4 mg Sublingual Q5 min PRN Sudini, Alveta Heimlich, MD      . ondansetron (ZOFRAN) tablet 4 mg  4 mg Oral Q6H PRN Hillary Bow, MD       Or  . ondansetron (ZOFRAN) injection 4 mg  4 mg Intravenous Q6H PRN Sudini, Alveta Heimlich, MD      . oxyCODONE (Oxy IR/ROXICODONE) immediate release tablet 5 mg  5 mg Oral Q4H PRN Sudini, Srikar, MD      . oxymetazoline (AFRIN) 0.05 % nasal spray 1 spray  1 spray Each Nare QHS PRN Awilda Bill, NP      . pantoprazole (PROTONIX) 80 mg in sodium chloride 0.9 % 250 mL (0.32 mg/mL) infusion  8 mg/hr Intravenous Continuous Hillary Bow, MD 25 mL/hr at 07/10/17 0347 8 mg/hr at 07/10/17 0347  . [START ON 07/12/2017] pantoprazole (PROTONIX) injection 40 mg  40 mg Intravenous Q12H Sudini, Srikar, MD      . predniSONE (DELTASONE) tablet 70 mg  70 mg Oral Daily Lloyd Huger, MD   70 mg at 07/09/17 1651  . sodium chloride flush (NS) 0.9 % injection 3 mL  3 mL Intravenous Q12H Hillary Bow, MD   3 mL at 07/09/17 1111  . traZODone (DESYREL) tablet 50 mg  50 mg Oral QHS PRN Sudini, Alveta Heimlich, MD        OBJECTIVE: Vitals:   07/10/17 0500 07/10/17 0600  BP: 128/63 (!) 141/68  Pulse: (!) 59 (!) 48  Resp: 15 14  Temp:    SpO2: 95% 99%     Body mass index is 23.66 kg/m.    ECOG FS:2  - Symptomatic, <50% confined to bed  General: Well-developed, well-nourished, no acute distress. Eyes: Pink conjunctiva, anicteric sclera. Lungs: Clear to auscultation bilaterally. Heart: Regular rate and rhythm. No rubs, murmurs, or gallops. Abdomen: Soft, nontender, nondistended. No organomegaly noted, normoactive bowel sounds. Musculoskeletal: No edema, cyanosis, or clubbing. Neuro: Alert, answering all questions appropriately. Cranial nerves grossly intact. Skin: No rashes or petechiae noted. Psych: Normal affect.   LAB  RESULTS:  Lab Results  Component Value Date   NA 144 07/10/2017   K 4.4 07/10/2017   CL 114 (H) 07/10/2017   CO2 19 (L) 07/10/2017   GLUCOSE 254 (H) 07/10/2017   BUN 32 (H) 07/10/2017   CREATININE 1.60 (H) 07/10/2017   CALCIUM 8.8 (L) 07/10/2017   PROT 5.7 (L) 07/08/2017   ALBUMIN 3.7 07/08/2017   AST 22 07/08/2017   ALT 13 (L) 07/08/2017   ALKPHOS 53 07/08/2017   BILITOT 0.8 07/08/2017   GFRNONAA 40 (L) 07/10/2017   GFRAA 46 (L) 07/10/2017    Lab Results  Component Value Date   WBC 49.1 (H) 07/10/2017   NEUTROABS 5.8 07/08/2017   HGB 8.3 (L) 07/10/2017   HCT 26.8 (L) 07/10/2017   MCV 98.2 07/10/2017   PLT 5 (LL) 07/10/2017     STUDIES: Dg Chest 2 View  Result Date: 07/08/2017 CLINICAL DATA:  Chest pain EXAM: CHEST - 2 VIEW COMPARISON:  October 21, 2015 FINDINGS: The heart size and mediastinal contours are within normal limits. Both lungs are clear. The visualized skeletal structures are unremarkable. IMPRESSION: No active cardiopulmonary disease. Electronically Signed   By: Dorise Bullion III M.D   On: 07/08/2017 15:35    ASSESSMENT: CLL, severe anemia and thrombocytopenia  PLAN:    1.  CLL: Patient's white blood cell count increased today, likely from 70 mg prednisone given last night.  The warm antibody seen on type and screen may be secondary to his underlying CLL.  Continue to monitor daily CBC. 2.  Anemia: No evidence of TTP or DIC.   Patient's hemoglobin significantly improved with blood transfusion last night.  He does not require additional blood today.  Given his iron deficiency, he may benefit from IV iron in the near future.  Continue prednisone as above.  Patient will require luminal evaluation once his platelets have improved. 3.  Thrombocytopenia: Platelet count worse today.  Continue 70 mg prednisone daily.  Patient only has had 1 dose at this point.  Proceed with platelet transfusion today.  May be consumptive secondary to GI bleed, but patient may also have a component of ITP secondary to his CLL.  This is not TTP or DIC as above.    If no improvement in platelet count by the weekend, will consider bone marrow biopsy early next week.  Will follow.    Lloyd Huger, MD   07/10/2017 8:39 AM

## 2017-07-11 LAB — GLUCOSE, CAPILLARY
GLUCOSE-CAPILLARY: 248 mg/dL — AB (ref 65–99)
Glucose-Capillary: 245 mg/dL — ABNORMAL HIGH (ref 65–99)
Glucose-Capillary: 254 mg/dL — ABNORMAL HIGH (ref 65–99)
Glucose-Capillary: 299 mg/dL — ABNORMAL HIGH (ref 65–99)

## 2017-07-11 LAB — CBC WITH DIFFERENTIAL/PLATELET
Basophils Absolute: 0 10*3/uL (ref 0–0.1)
Basophils Relative: 0 %
EOS ABS: 0 10*3/uL (ref 0–0.7)
Eosinophils Relative: 0 %
HEMATOCRIT: 21.8 % — AB (ref 40.0–52.0)
HEMOGLOBIN: 6.9 g/dL — AB (ref 13.0–18.0)
LYMPHS ABS: 24.1 10*3/uL — AB (ref 1.0–3.6)
LYMPHS PCT: 68 %
MCH: 30.4 pg (ref 26.0–34.0)
MCHC: 31.4 g/dL — ABNORMAL LOW (ref 32.0–36.0)
MCV: 96.7 fL (ref 80.0–100.0)
MONO ABS: 0.9 10*3/uL (ref 0.2–1.0)
Monocytes Relative: 3 %
NEUTROS ABS: 10 10*3/uL — AB (ref 1.4–6.5)
Neutrophils Relative %: 29 %
Platelets: 14 10*3/uL — CL (ref 150–440)
RBC: 2.26 MIL/uL — ABNORMAL LOW (ref 4.40–5.90)
RDW: 24.2 % — AB (ref 11.5–14.5)
WBC: 35 10*3/uL — AB (ref 3.8–10.6)

## 2017-07-11 LAB — BASIC METABOLIC PANEL
Anion gap: 7 (ref 5–15)
BUN: 41 mg/dL — AB (ref 6–20)
CALCIUM: 8.5 mg/dL — AB (ref 8.9–10.3)
CHLORIDE: 112 mmol/L — AB (ref 101–111)
CO2: 23 mmol/L (ref 22–32)
CREATININE: 1.66 mg/dL — AB (ref 0.61–1.24)
GFR calc Af Amer: 44 mL/min — ABNORMAL LOW (ref >60)
GFR calc non Af Amer: 38 mL/min — ABNORMAL LOW (ref >60)
Glucose, Bld: 275 mg/dL — ABNORMAL HIGH (ref 65–99)
Potassium: 4.3 mmol/L (ref 3.5–5.1)
SODIUM: 142 mmol/L (ref 135–145)

## 2017-07-11 LAB — BPAM PLATELET PHERESIS
Blood Product Expiration Date: 201905102157
ISSUE DATE / TIME: 201905092344
Unit Type and Rh: 6200

## 2017-07-11 LAB — CBC
HCT: 26 % — ABNORMAL LOW (ref 40.0–52.0)
HEMOGLOBIN: 8.2 g/dL — AB (ref 13.0–18.0)
MCH: 29.2 pg (ref 26.0–34.0)
MCHC: 31.5 g/dL — ABNORMAL LOW (ref 32.0–36.0)
MCV: 92.7 fL (ref 80.0–100.0)
PLATELETS: 22 10*3/uL — AB (ref 150–440)
RBC: 2.8 MIL/uL — AB (ref 4.40–5.90)
RDW: 25.7 % — AB (ref 11.5–14.5)
WBC: 42.2 10*3/uL — AB (ref 3.8–10.6)

## 2017-07-11 LAB — PREPARE PLATELET PHERESIS: Unit division: 0

## 2017-07-11 LAB — PREPARE RBC (CROSSMATCH)

## 2017-07-11 MED ORDER — ACETAMINOPHEN 325 MG PO TABS
650.0000 mg | ORAL_TABLET | Freq: Once | ORAL | Status: AC
Start: 2017-07-11 — End: 2017-07-11
  Administered 2017-07-11: 650 mg via ORAL
  Filled 2017-07-11: qty 2

## 2017-07-11 MED ORDER — INSULIN DETEMIR 100 UNIT/ML ~~LOC~~ SOLN
13.0000 [IU] | Freq: Two times a day (BID) | SUBCUTANEOUS | Status: DC
  Administered 2017-07-11 – 2017-07-15 (×7): 13 [IU] via SUBCUTANEOUS
  Filled 2017-07-11 (×10): qty 0.13

## 2017-07-11 MED ORDER — INSULIN ASPART 100 UNIT/ML ~~LOC~~ SOLN
6.0000 [IU] | Freq: Three times a day (TID) | SUBCUTANEOUS | Status: DC
  Administered 2017-07-11 – 2017-07-15 (×12): 6 [IU] via SUBCUTANEOUS
  Filled 2017-07-11 (×12): qty 1

## 2017-07-11 MED ORDER — DIPHENHYDRAMINE HCL 25 MG PO CAPS
25.0000 mg | ORAL_CAPSULE | Freq: Once | ORAL | Status: AC
  Administered 2017-07-11: 25 mg via ORAL
  Filled 2017-07-11: qty 1

## 2017-07-11 MED ORDER — SODIUM CHLORIDE 0.9 % IV SOLN
Freq: Once | INTRAVENOUS | Status: AC
  Administered 2017-07-11: 12:00:00 via INTRAVENOUS

## 2017-07-11 MED ORDER — SODIUM CHLORIDE 0.9 % IV SOLN: Freq: Once | INTRAVENOUS | Status: DC

## 2017-07-11 MED ORDER — ACETAMINOPHEN 325 MG PO TABS
650.0000 mg | ORAL_TABLET | Freq: Once | ORAL | Status: AC
  Administered 2017-07-11: 650 mg via ORAL
  Filled 2017-07-11: qty 2

## 2017-07-11 NOTE — Progress Notes (Signed)
[place] at [place] NAME: Scott [name]    MR#:  [id number]  DATE OF BIRTH:  [birth year]  SUBJECTIVE:   Laying in bed.  Feels stronger after transfusion. No shortness of breath. No further bleeding.   REVIEW OF SYSTEMS:    ROS  CONSTITUTIONAL: No documented fever. Has fatigue, weakness. No weight gain, no weight loss.  EYES: No blurry or double vision.  ENT: No tinnitus. No postnasal drip. No redness of the oropharynx.  RESPIRATORY: No cough, no wheeze, no hemoptysis. No dyspnea.  CARDIOVASCULAR: No chest pain. No orthopnea. No palpitations. No syncope.  GASTROINTESTINAL: No nausea, no vomiting or diarrhea. No abdominal pain. No melena or hematochezia.  GENITOURINARY: No dysuria or hematuria.  ENDOCRINE: No polyuria or nocturia. No heat or cold intolerance.  HEMATOLOGY: Has anemia. No bruising. No bleeding.  INTEGUMENTARY: No rashes. No lesions.  MUSCULOSKELETAL: No arthritis. No swelling. No gout.  NEUROLOGIC: No numbness, tingling, or ataxia. No seizure-type activity.  PSYCHIATRIC: No anxiety. No insomnia. No ADD.   DRUG ALLERGIES:  No Known Allergies  VITALS:  Blood pressure (!) 152/71, pulse (!) 58, temperature (!) 97.4 F (36.3 C), temperature source Oral, resp. rate 18, height 5\' 10"  (1.778 m), weight 74.8 kg (164 lb 14.5 oz), SpO2 100 %.  PHYSICAL EXAMINATION:   Physical Exam  GENERAL:  77 y.o.-year-old patient lying in the bed with no acute distress.  EYES: Pupils equal, round, reactive to light and accommodation. No scleral icterus. Extraocular muscles intact. Pallor present HEENT: Head atraumatic, normocephalic. Oropharynx and nasopharynx clear.  NECK:  Supple, no jugular venous distention. No thyroid enlargement, no tenderness.  LUNGS: Normal breath sounds bilaterally, no wheezing, rales, rhonchi. No use of accessory muscles of respiration.  CARDIOVASCULAR: S1, S2 normal. No murmurs, rubs, or gallops.  ABDOMEN: Soft,  nontender, nondistended. Bowel sounds present. No organomegaly or mass.  EXTREMITIES: No cyanosis, clubbing .  Minimal edema in lower extremities NEUROLOGIC: Cranial nerves II through XII are intact. No focal Motor or sensory deficits b/l.   PSYCHIATRIC: The patient is alert and oriented x 3.  SKIN: No obvious rash, lesion, or ulcer.   LABORATORY PANEL:   CBC Recent Labs  Lab 07/11/17 0535  WBC 35.0*  HGB 6.9*  HCT 21.8*  PLT 14*   ------------------------------------------------------------------------------------------------------------------ Chemistries  Recent Labs  Lab 07/08/17 1605  07/11/17 0535  NA  --    < > 142  K  --    < > 4.3  CL  --    < > 112*  CO2  --    < > 23  GLUCOSE  --    < > 275*  BUN  --    < > 41*  CREATININE  --    < > 1.66*  CALCIUM  --    < > 8.5*  AST 22  --   --   ALT 13*  --   --   ALKPHOS 53  --   --   BILITOT 0.8  --   --    < > = values in this interval not displayed.   ------------------------------------------------------------------------------------------------------------------  Cardiac Enzymes Recent Labs  Lab 07/08/17 1439  TROPONINI 0.04*   ------------------------------------------------------------------------------------------------------------------  RADIOLOGY:  No results found.   ASSESSMENT AND PLAN:  77 year old male patient with history of CLL, diabetes mellitus, hypertension currently in stepdown unit for severe low platelets and anemia.  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  Also possible consumption from GI bleed.  -Chronic lymphocytic leukemia Oncology follow-up  -Chest pain was likely due to severe anemia.  Has resolved.  -  DM SSI  All the records are reviewed and case discussed with Care Management/Social Worker. Management plans discussed with the patient, family and they are in agreement.  CODE STATUS: DNR  DVT Prophylaxis: SCDs  TOTAL TIME TAKING CARE OF THIS PATIENT: 35 minutes.   Scott [name] M.D on 07/11/2017 at 12:25 PM  Between 7am to 6pm - Pager - [phone]  After 6pm go to [email or web address] - password [email or web address] [facility]  Office  [phone]  CC: Primary care physician; Scott Bush, MD  Note: This dictation was prepared with Dragon dictation along with smaller phrase technology. Any transcriptional errors that result from this process are unintentional.

## 2017-07-11 NOTE — Progress Notes (Signed)
Inpatient Diabetes Program Recommendations  AACE/ADA: New Consensus Statement on Inpatient Glycemic Control (2015)  Target Ranges:  Prepandial:   less than 140 mg/dL      Peak postprandial:   less than 180 mg/dL (1-2 hours)      Critically ill patients:  140 - 180 mg/dL   Results for LEMON, STERNBERG (MRN [id number]) as of 07/11/2017 09:36  Ref. Range 07/10/2017 07:32 07/10/2017 09:26 07/10/2017 11:38 07/10/2017 16:22 07/10/2017 22:19 07/11/2017 07:38  Glucose-Capillary Latest Ref Range: 65 - 99 mg/dL 235 (H)  Novolog 5 units 382 (H)     Levemir 12 units 394 (H)  Novolog 15 units 314 (H)  Novolog 11 units 248 (H)  Novolog 2 units  Levemir 10 units 245 (H)  Novolog 5 units   Review of Glycemic Control  Diabetes history: DM2 Outpatient Diabetes medications: Levemir 25 units QHS, Novolog 3-7 units TID with meals Current orders for Inpatient glycemic control: Levemir 12 units daily, Levemir 10 units QHS, Novolog 0-15 units TID with meals, Novolog 0-5 units QHS; Prednisone 70 mg daily  Inpatient Diabetes Program Recommendations:  Insulin - Basal: If steroids are continued, please consider increasing Levemir to 13 units BID. Insulin - Meal Coverage: If steroids are continued, please consider ordering Novolog 6 units TID with meals for meal coverage if patient eats at least 50% of meals (in addition to Novolog correction scale). Diet: If appropriate, please discontinue Regular diet and order Carb Modified diet.  Noted consult for assistance with inpatient glycemic control. Chart reviewed.  Thanks, Barnie Alderman, RN, MSN, CDE Diabetes Coordinator Inpatient Diabetes Program [phone] (Team Pager from 8am to 5pm)

## 2017-07-11 NOTE — Progress Notes (Signed)
Scott [name] at [place] NAME: Scott [name]    MR#:  [id number]  DATE OF BIRTH:  [birth year]  SUBJECTIVE:   Sitting up in bed.  Son at bedside. One episode of bloody stool overnight.   REVIEW OF SYSTEMS:    ROS  CONSTITUTIONAL: No documented fever. Has fatigue, weakness. No weight gain, no weight loss.  EYES: No blurry or double vision.  ENT: No tinnitus. No postnasal drip. No redness of the oropharynx.  RESPIRATORY: No cough, no wheeze, no hemoptysis. No dyspnea.  CARDIOVASCULAR: No chest pain. No orthopnea. No palpitations. No syncope.  GASTROINTESTINAL: No nausea, no vomiting or diarrhea. No abdominal pain. No melena or hematochezia.  GENITOURINARY: No dysuria or hematuria.  ENDOCRINE: No polyuria or nocturia. No heat or cold intolerance.  HEMATOLOGY: Has anemia. No bruising. No bleeding.  INTEGUMENTARY: No rashes. No lesions.  MUSCULOSKELETAL: No arthritis. No swelling. No gout.  NEUROLOGIC: No numbness, tingling, or ataxia. No seizure-type activity.  PSYCHIATRIC: No anxiety. No insomnia. No ADD.   DRUG ALLERGIES:  No Known Allergies  VITALS:  Blood pressure (!) 152/71, pulse (!) 58, temperature (!) 97.4 F (36.3 C), temperature source Oral, resp. rate 18, height 5\' 10"  (1.778 m), weight 74.8 kg (164 lb 14.5 oz), SpO2 100 %.  PHYSICAL EXAMINATION:   Physical Exam  GENERAL:  77 y.o.-year-old patient lying in the bed with no acute distress.  EYES: Pupils equal, round, reactive to light and accommodation. No scleral icterus. Extraocular muscles intact. Pallor present HEENT: Head atraumatic, normocephalic. Oropharynx and nasopharynx clear.  NECK:  Supple, no jugular venous distention. No thyroid enlargement, no tenderness.  LUNGS: Normal breath sounds bilaterally, no wheezing, rales, rhonchi. No use of accessory muscles of respiration.  CARDIOVASCULAR: S1, S2 normal. No murmurs, rubs, or gallops.  ABDOMEN: Soft, nontender,  nondistended. Bowel sounds present. No organomegaly or mass.  EXTREMITIES: No cyanosis, clubbing .  Minimal edema in lower extremities NEUROLOGIC: Cranial nerves II through XII are intact. No focal Motor or sensory deficits b/l.   PSYCHIATRIC: The patient is alert and oriented x 3.  SKIN: No obvious rash, lesion, or ulcer.   LABORATORY PANEL:   CBC Recent Labs  Lab 07/11/17 0535  WBC 35.0*  HGB 6.9*  HCT 21.8*  PLT 14*   ------------------------------------------------------------------------------------------------------------------ Chemistries  Recent Labs  Lab 07/08/17 1605  07/11/17 0535  NA  --    < > 142  K  --    < > 4.3  CL  --    < > 112*  CO2  --    < > 23  GLUCOSE  --    < > 275*  BUN  --    < > 41*  CREATININE  --    < > 1.66*  CALCIUM  --    < > 8.5*  AST 22  --   --   ALT 13*  --   --   ALKPHOS 53  --   --   BILITOT 0.8  --   --    < > = values in this interval not displayed.   ------------------------------------------------------------------------------------------------------------------  Cardiac Enzymes Recent Labs  Lab 07/08/17 1439  TROPONINI 0.04*   ------------------------------------------------------------------------------------------------------------------  RADIOLOGY:  No results found.   ASSESSMENT AND PLAN:  77 year old male patient with history of CLL, diabetes mellitus, hypertension currently in stepdown unit for severe low platelets and anemia.  - Acute anemia secondary to CLL and GI bleeding Blood transfusion today to  keep hemoglobin greater than 8. 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  Also possible consumption from GI bleed. Oncology following  -Chronic lymphocytic leukemia Oncology follow-up  -Chest pain was likely due to severe anemia.  Has resolved.  - DM SSI  All the  records are reviewed and case discussed with Care Management/Social Worker. Management plans discussed with the patient, family and they are in agreement.  CODE STATUS: DNR  DVT Prophylaxis: SCDs  TOTAL TIME TAKING CARE OF THIS PATIENT: 35 minutes.   Scott [name] M.D on 07/11/2017 at 12:34 PM  Between 7am to 6pm - Pager - [phone]  After 6pm go to [email or web address] - password [email or web address] [facility]  Office  [phone]  CC: Primary care physician; Scott Bush, MD  Note: This dictation was prepared with Dragon dictation along with smaller phrase technology. Any transcriptional errors that result from this process are unintentional.

## 2017-07-11 NOTE — Progress Notes (Signed)
Follow up - Critical Care Medicine Note  Patient Details:    Scott [name] is an 77 y.o. male.with history of CLL, anemia, thrombocytopenia presented with severe anemia, difficult blood match secondary to auto antibodies, status post transfusion of 2 units packed red blood cells  Lines, Airways, Drains:    Anti-infectives:  Anti-infectives (From admission, onward)   None      Microbiology: Results for orders placed or performed during the hospital encounter of 07/08/17  MRSA PCR Screening     Status: None   Collection Time: 07/08/17 10:00 PM  Result Value Ref Range Status   MRSA by PCR NEGATIVE NEGATIVE Final    Comment:        The GeneXpert MRSA Assay (FDA approved for NASAL specimens only), is one component of a comprehensive MRSA colonization surveillance program. It is not intended to diagnose MRSA infection nor to guide or monitor treatment for MRSA infections. Performed at [facility], [address] [place], [place] [address]     Studies: Dg Chest 2 View  Result Date: 07/08/2017 CLINICAL DATA:  Chest pain EXAM: CHEST - 2 VIEW COMPARISON:  October 21, 2015 FINDINGS: The heart size and mediastinal contours are within normal limits. Both lungs are clear. The visualized skeletal structures are unremarkable. IMPRESSION: No active cardiopulmonary disease. Electronically Signed   By: Dorise Bullion III M.D   On: 07/08/2017 15:35    Consults: Treatment Team:  Pccm, Ander Gaster, MD Jonathon Bellows, MD Lloyd Huger, MD   Subjective:    Overnight Issues: patient had an episode of bright red blood per rectum. Has received now 2 units of packed red blood cells and 2 units of platelets. Repeat hemoglobin this morning is 6.9 with a platelet count of 14  Objective:  Vital signs for last 24 hours: Temp:  [97.8 F (36.6 C)-98.2 F (36.8 C)] 97.9 F (36.6 C) (05/10 0400) Pulse Rate:  [53-90] 56 (05/10 0500) Resp:  [12-28] 17 (05/10 0500) BP:  (130-160)/(59-85) 153/69 (05/10 0500) SpO2:  [95 %-100 %] 99 % (05/10 0500) Weight:  [164 lb 14.5 oz (74.8 kg)] 164 lb 14.5 oz (74.8 kg) (05/10 0500)  Hemodynamic parameters for last 24 hours:    Intake/Output from previous day: 05/09 0701 - 05/10 0700 In: 921.7 [I.V.:475; Blood:446.7] Out: 1650 [Urine:1650]  Intake/Output this shift: No intake/output data recorded.  Vent settings for last 24 hours:    Physical Exam:  Patient is awake, alert, oriented in no acute distress Vital signs: Please see the above listed vital signs Cardiovascular: Regular rate and rhythm Pulmonary: Clear to auscultation Abdominal: Positive bowel sounds, soft exam Extremities: Amputations noted no clubbing cyanosis or edema Neurologic: No focal deficits appreciated  Assessment/Plan:   Severe anemia / Thrombocytopenia. Status post transfusion of 2 units packed red blood cells. Appreciate Dr. Gary Fleet consultation. Not felt to be ITP or TTP, most likely autoimmune with warm antibody secondary to CLL. Patient with an episode of prior blood per rectum. Hemoglobin this morning is 6.9 with a platelet count of 14, will transfuse 1 unit of packed red blood cells and another round of platelets. Dr. [name] is considering bone marrow biopsy.  Gastrointestinal bleeding. Appreciate gastroenterology's input. Holding off on endoscopy at this time with the goal to replace platelets and blood products  [name] 07/11/2017  *Care during the described time interval was provided by me and/or other providers on the critical care team.  I have reviewed this patient's available data, including medical history, events of  note, physical examination and test results as part of my evaluation. Patient ID: Scott [name], male   DOB: [birth year], 77 y.o.   MRN: [id number]

## 2017-07-12 DIAGNOSIS — K922 Gastrointestinal hemorrhage, unspecified: Secondary | ICD-10-CM

## 2017-07-12 DIAGNOSIS — R109 Unspecified abdominal pain: Secondary | ICD-10-CM

## 2017-07-12 DIAGNOSIS — R5383 Other fatigue: Secondary | ICD-10-CM

## 2017-07-12 LAB — GLUCOSE, CAPILLARY
GLUCOSE-CAPILLARY: 221 mg/dL — AB (ref 65–99)
GLUCOSE-CAPILLARY: 254 mg/dL — AB (ref 65–99)
Glucose-Capillary: 157 mg/dL — ABNORMAL HIGH (ref 65–99)
Glucose-Capillary: 286 mg/dL — ABNORMAL HIGH (ref 65–99)

## 2017-07-12 LAB — BPAM RBC
BLOOD PRODUCT EXPIRATION DATE: 201906012359
BLOOD PRODUCT EXPIRATION DATE: 201906032359
BLOOD PRODUCT EXPIRATION DATE: 201906032359
Blood Product Expiration Date: 201905312359
ISSUE DATE / TIME: 201905082059
ISSUE DATE / TIME: 201905081713
ISSUE DATE / TIME: 201905101043
UNIT TYPE AND RH: 6200
Unit Type and Rh: 6200
Unit Type and Rh: 6200
Unit Type and Rh: 6200

## 2017-07-12 LAB — PREPARE PLATELET PHERESIS: UNIT DIVISION: 0

## 2017-07-12 LAB — TYPE AND SCREEN
ABO/RH(D): A POS
ANTIBODY SCREEN: POSITIVE
DAT, IgG: POSITIVE
DAT, complement: POSITIVE
UNIT DIVISION: 0
UNIT DIVISION: 0
Unit division: 0
Unit division: 0

## 2017-07-12 LAB — COMPREHENSIVE METABOLIC PANEL
ALT: 19 U/L (ref 17–63)
ANION GAP: 7 (ref 5–15)
AST: 24 U/L (ref 15–41)
Albumin: 3.3 g/dL — ABNORMAL LOW (ref 3.5–5.0)
Alkaline Phosphatase: 50 U/L (ref 38–126)
BUN: 46 mg/dL — ABNORMAL HIGH (ref 6–20)
CALCIUM: 8.5 mg/dL — AB (ref 8.9–10.3)
CHLORIDE: 111 mmol/L (ref 101–111)
CO2: 24 mmol/L (ref 22–32)
Creatinine, Ser: 1.49 mg/dL — ABNORMAL HIGH (ref 0.61–1.24)
GFR calc non Af Amer: 44 mL/min — ABNORMAL LOW (ref >60)
GFR, EST AFRICAN AMERICAN: 50 mL/min — AB (ref >60)
Glucose, Bld: 287 mg/dL — ABNORMAL HIGH (ref 65–99)
Potassium: 4 mmol/L (ref 3.5–5.1)
SODIUM: 142 mmol/L (ref 135–145)
Total Bilirubin: 0.8 mg/dL (ref 0.3–1.2)
Total Protein: 5.4 g/dL — ABNORMAL LOW (ref 6.5–8.1)

## 2017-07-12 LAB — BPAM PLATELET PHERESIS
Blood Product Expiration Date: 201905112359
ISSUE DATE / TIME: 201905101430
Unit Type and Rh: 600

## 2017-07-12 LAB — CBC
HCT: 25.1 % — ABNORMAL LOW (ref 40.0–52.0)
Hemoglobin: 7.9 g/dL — ABNORMAL LOW (ref 13.0–18.0)
MCH: 29.1 pg (ref 26.0–34.0)
MCHC: 31.4 g/dL — AB (ref 32.0–36.0)
MCV: 93 fL (ref 80.0–100.0)
PLATELETS: 12 10*3/uL — AB (ref 150–440)
RBC: 2.7 MIL/uL — ABNORMAL LOW (ref 4.40–5.90)
RDW: 25.7 % — AB (ref 11.5–14.5)
WBC: 44 10*3/uL — ABNORMAL HIGH (ref 3.8–10.6)

## 2017-07-12 LAB — PHOSPHORUS: Phosphorus: 4.6 mg/dL (ref 2.5–4.6)

## 2017-07-12 LAB — MAGNESIUM: Magnesium: 1.2 mg/dL — ABNORMAL LOW (ref 1.7–2.4)

## 2017-07-12 MED ORDER — IMMUNE GLOBULIN (HUMAN) 10 GM/100ML IV SOLN
70.0000 g | INTRAVENOUS | Status: AC
  Administered 2017-07-12 – 2017-07-13 (×2): 70 g via INTRAVENOUS
  Filled 2017-07-12: qty 600
  Filled 2017-07-12: qty 700

## 2017-07-12 MED ORDER — HYDRALAZINE HCL 20 MG/ML IJ SOLN: 10.0000 mg | INTRAMUSCULAR | Status: DC | PRN

## 2017-07-12 MED ORDER — IRON DEXTRAN 50 MG/ML IJ SOLN
100.0000 mg | Freq: Once | INTRAMUSCULAR | Status: AC
  Administered 2017-07-12: 100 mg via INTRAVENOUS
  Filled 2017-07-12 (×4): qty 2

## 2017-07-12 MED ORDER — ALUM & MAG HYDROXIDE-SIMETH 200-200-20 MG/5ML PO SUSP
15.0000 mL | Freq: Four times a day (QID) | ORAL | Status: DC | PRN
  Filled 2017-07-12: qty 30

## 2017-07-12 MED ORDER — MAGNESIUM SULFATE 4 GM/100ML IV SOLN
4.0000 g | Freq: Once | INTRAVENOUS | Status: AC
  Administered 2017-07-12: 4 g via INTRAVENOUS
  Filled 2017-07-12: qty 100

## 2017-07-12 NOTE — Progress Notes (Signed)
   07/12/17 0000  Vitals  BP (!) 166/77  MAP (mmHg) 101  Pulse Rate (!) 47  ECG Heart Rate (!) 49  Cardiac Rhythm SB  Resp 15  Oxygen Therapy  SpO2 98 %   Discussed BP with Ms. Patria Mane, NP, discussed HR, & home medications. Was instructed to alert NP if SBP was > 190, will continue to monitor pt. Closely

## 2017-07-12 NOTE — Progress Notes (Signed)
[facility] [place] NAME: Scott [name]    MR#:  [id number]  DATE OF BIRTH:  04/19/1940  SUBJECTIVE:  CHIEF COMPLAINT:   Chief Complaint  Patient presents with  . Chest Pain  No events overnight, oncology/intensivist input appreciated-possible floor transfer later today  REVIEW OF SYSTEMS:  CONSTITUTIONAL: No fever, fatigue or weakness.  EYES: No blurred or double vision.  EARS, NOSE, AND THROAT: No tinnitus or ear pain.  RESPIRATORY: No cough, shortness of breath, wheezing or hemoptysis.  CARDIOVASCULAR: No chest pain, orthopnea, edema.  GASTROINTESTINAL: No nausea, vomiting, diarrhea or abdominal pain.  GENITOURINARY: No dysuria, hematuria.  ENDOCRINE: No polyuria, nocturia,  HEMATOLOGY: No anemia, easy bruising or bleeding SKIN: No rash or lesion. MUSCULOSKELETAL: No joint pain or arthritis.   NEUROLOGIC: No tingling, numbness, weakness.  PSYCHIATRY: No anxiety or depression.   ROS  DRUG ALLERGIES:  No Known Allergies  VITALS:  Blood pressure (!) 150/67, pulse 62, temperature 97.8 F (36.6 C), temperature source Oral, resp. rate 19, height 5\' 10"  (1.778 m), weight 73.8 kg (162 lb 11.2 oz), SpO2 99 %.  PHYSICAL EXAMINATION:  GENERAL:  77 y.o.-year-old patient lying in the bed with no acute distress.  EYES: Pupils equal, round, reactive to light and accommodation. No scleral icterus. Extraocular muscles intact.  HEENT: Head atraumatic, normocephalic. Oropharynx and nasopharynx clear.  NECK:  Supple, no jugular venous distention. No thyroid enlargement, no tenderness.  LUNGS: Normal breath sounds bilaterally, no wheezing, rales,rhonchi or crepitation. No use of accessory muscles of respiration.  CARDIOVASCULAR: S1, S2 normal. No murmurs, rubs, or gallops.  ABDOMEN: Soft, nontender, nondistended. Bowel sounds present. No organomegaly or mass.  EXTREMITIES: No pedal edema, cyanosis, or clubbing.  NEUROLOGIC: Cranial nerves II  through XII are intact. Muscle strength 5/5 in all extremities. Sensation intact. Gait not checked.  PSYCHIATRIC: The patient is alert and oriented x 3.  SKIN: No obvious rash, lesion, or ulcer.   Physical Exam LABORATORY PANEL:   CBC Recent Labs  Lab 07/12/17 0529  WBC 44.0*  HGB 7.9*  HCT 25.1*  PLT 12*   ------------------------------------------------------------------------------------------------------------------  Chemistries  Recent Labs  Lab 07/12/17 0529  NA 142  K 4.0  CL 111  CO2 24  GLUCOSE 287*  BUN 46*  CREATININE 1.49*  CALCIUM 8.5*  MG 1.2*  AST 24  ALT 19  ALKPHOS 50  BILITOT 0.8   ------------------------------------------------------------------------------------------------------------------  Cardiac Enzymes Recent Labs  Lab 07/08/17 1439  TROPONINI 0.04*   ------------------------------------------------------------------------------------------------------------------  RADIOLOGY:  No results found.  ASSESSMENT AND PLAN:  77 year old male patient with history of CLL, diabetes mellitus, hypertension currently in stepdown unit for severe low platelets and anemia.  - Acute anemia secondary to CLL and GI bleeding Status post transfusion  Gastroenterology input appreciated - enndoscopy deferred due to thrombocytopenia Per oncology-IV iron prior to discharge  -Severe thrombocytopenia Secondary to ITP in setting of CLL-oncology input greatly appreciated/IVIG daily for 2 doses Persistent despite platelet transfusion   -Chronic lymphocytic leukemia Oncology follow-up  -Chest pain  Resolved  likely due to severe anemia  - DM SSI  All the records are reviewed and case discussed with Care Management/Social Workerr. Management plans discussed with the patient, family and they are in agreement.  CODE STATUS: DNR  TOTAL TIME TAKING CARE OF THIS PATIENT: 35 minutes.     POSSIBLE D/C IN 2-3 DAYS, DEPENDING ON CLINICAL  CONDITION.   Avel Peace Euan Wandler M.D on 07/12/2017   Between  7am to 6pm - Pager - [phone]  After 6pm go to [email or web address] - password [email or web address] [facility]  Office  [phone]  CC: Primary care physician; [name], MD  Note: This dictation was prepared with Dragon dictation along with smaller phrase technology. Any transcriptional errors that result from this process are unintentional.

## 2017-07-12 NOTE — Progress Notes (Signed)
Follow up - Critical Care Medicine Note  Patient Details:    Scott [name] is an 77 y.o. male.with history of CLL, anemia, thrombocytopenia presented with severe anemia, difficult blood match secondary to auto antibodies, status post transfusion of 2 units packed red blood cells  Lines, Airways, Drains:    Anti-infectives:  Anti-infectives (From admission, onward)   None      Microbiology: Results for orders placed or performed during the hospital encounter of 07/08/17  MRSA PCR Screening     Status: None   Collection Time: 07/08/17 10:00 PM  Result Value Ref Range Status   MRSA by PCR NEGATIVE NEGATIVE Final    Comment:        The GeneXpert MRSA Assay (FDA approved for NASAL specimens only), is one component of a comprehensive MRSA colonization surveillance program. It is not intended to diagnose MRSA infection nor to guide or monitor treatment for MRSA infections. Performed at [name], [address] [place], [place] [address]     Studies: Dg Chest 2 View  Result Date: 07/08/2017 CLINICAL DATA:  Chest pain EXAM: CHEST - 2 VIEW COMPARISON:  October 21, 2015 FINDINGS: The heart size and mediastinal contours are within normal limits. Both lungs are clear. The visualized skeletal structures are unremarkable. IMPRESSION: No active cardiopulmonary disease. Electronically Signed   By: Dorise Bullion III M.D   On: 07/08/2017 15:35    Consults: Treatment Team:  Pccm, Ander Gaster, MD Jonathon Bellows, MD Lloyd Huger, MD   Subjective:    Overnight Issues:no significant issues over the last 24 hours. Patient does not have any further evidence of active GI bleeding. Received one unit of blood with 1 unit of platelets. Hemoglobin 7.9, platelets 12, refractory to transfusion  Objective:  Vital signs for last 24 hours: Temp:  [97.4 F (36.3 C)-98.4 F (36.9 C)] 97.8 F (36.6 C) (05/11 0744) Pulse Rate:  [45-81] 56 (05/11 0744) Resp:  [14-28] 28 (05/11  0744) BP: (128-167)/(52-90) 156/74 (05/11 0700) SpO2:  [97 %-100 %] 99 % (05/11 0744) Weight:  [162 lb 11.2 oz (73.8 kg)] 162 lb 11.2 oz (73.8 kg) (05/11 0500)  Hemodynamic parameters for last 24 hours:    Intake/Output from previous day: 05/10 0701 - 05/11 0700 In: 1361.1 [I.V.:323.1; Blood:1038] Out: 2925 [Urine:2925]  Intake/Output this shift: No intake/output data recorded.  Vent settings for last 24 hours:    Physical Exam:  Patient is awake, alert, oriented in no acute distress Vital signs: Please see the above listed vital signs Cardiovascular: Regular rate and rhythm Pulmonary: Clear to auscultation Abdominal: Positive bowel sounds, soft exam Extremities: Amputations noted no clubbing cyanosis or edema Neurologic: No focal deficits appreciated  Assessment/Plan:   Severe anemia / Thrombocytopenia. Appreciate Dr. Gary Fleet consultation. Not felt to be ITP or TTP, most likely autoimmune with warm antibody secondary to CLL. patient had one unit of blood and 1 unit of platelets given. Hemoglobin stable, likely limits do not respond to transfusion.  Gastrointestinal bleeding. Appreciate gastroenterology's input. Holding off on endoscopy at this time with the goal to replace platelets and blood products  At this point patient is doing well stable for floor transfer  Arber Wiemers 07/12/2017  *Care during the described time interval was provided by me and/or other providers on the critical care team.  I have reviewed this patient's available data, including medical history, events of note, physical examination and test results as part of my evaluation. Patient ID: [name], male   DOB:  01-Feb-1941, 77 y.o.   MRN: [id number] Patient ID: BRENDIN SITU, male   DOB: Dec 10, 1940, 77 y.o.   MRN: [id number]

## 2017-07-12 NOTE — Plan of Care (Signed)
Pt. A&O x 4, VSS O/N, labs stable, Room air, saline locked. Pt. Rested well, no care concerns at this time. Report given to [place].

## 2017-07-12 NOTE — Progress Notes (Signed)
Scott [name]   DOB:[birth year]   YP#:[id number]    Subjective: Thrombocytopenia/anemia/GI bleed Patient denies any blood in stools overnight.  He had not had a bowel movement this morning.  Complains of fatigue.  Otherwise resting in the bed comfortably.  Complains of mild abdominal pain epigastric.  No fevers.  Admits to easy bruising but no spontaneous bleeding.  Objective:  Vitals:   07/12/17 0744 07/12/17 0800  BP:  (!) 157/74  Pulse: (!) 56 61  Resp: (!) 28 19  Temp: 97.8 F (36.6 C)   SpO2: 99% 99%     Intake/Output Summary (Last 24 hours) at 07/12/2017 0913 Last data filed at 07/12/2017 0744 Gross per 24 hour  Intake 1311.08 ml  Output 3175 ml  Net -1863.92 ml    GENERAL Alert, no distress and comfortable.  Thin built.  Alone. EYES: no pallor or icterus OROPHARYNX: no thrush or ulceration. NECK: supple, no masses felt LYMPH:  no palpable lymphadenopathy in the cervical, axillary or inguinal regions LUNGS: decreased breath sounds to auscultation at bases and  No wheeze or crackles HEART/CVS: regular rate & rhythm and no murmurs; No lower extremity edema ABDOMEN: abdomen soft, tender  on deep palpation. and normal bowel sounds no epigastric tenderness on deep palpation. Musculoskeletal:no cyanosis of digits and no clubbing  PSYCH: alert & oriented x 3 with fluent speech NEURO: no focal motor/sensory deficits SKIN:  no rashes or significant lesions   Labs:  Lab Results  Component Value Date   WBC 44.0 (H) 07/12/2017   HGB 7.9 (L) 07/12/2017   HCT 25.1 (L) 07/12/2017   MCV 93.0 07/12/2017   PLT 12 (LL) 07/12/2017   NEUTROABS 10.0 (H) 07/11/2017    Lab Results  Component Value Date   NA 142 07/12/2017   K 4.0 07/12/2017   CL 111 07/12/2017   CO2 24 07/12/2017    Studies:  No results found.  Assessment & Plan:   #77 year old male patient history of CLL-admitted the hospital for GI bleed/anemia/thrombocytopenia.  #Acute ITP-in the context of CLL-not  responsive to steroids-prednisone 70 mg a day.  Platelets today 12.  Given the recent GI bleed/slow trending down of hemoglobin.  Recommend IVIG 1 g/kg/day x 2.  Discussed the potential side effects with the patient including but not limited to-infusion reactions; renal dysfunction etc.  Hold platelet transfusion; unless patient is actively bleeding.  #Severe anemia hemoglobin 7.9 status post PRBC transfusion no evidence of active hemolysis/likely secondary GI bleed/'s chronic kidney disease.  Iron saturation 11%/ferritin 21-patient may benefit from IV iron infusion prior to discharge.  #CLL-elevated white count 42~predominant lymphocytosis as expected of CLL.  Defer to patient's primary oncologist Dr. [name] for further management once acute issues resolve.  Abdominal discomfort  #Abdominal discomfort-question secondary to steroids/patient on PPI twice a day; recommend adding Maalox.   #The above plan of care was discussed with the patient in detail./Also discussed with the nursing staff.   Cammie Sickle, MD 07/12/2017  9:13 AM

## 2017-07-13 DIAGNOSIS — D62 Acute posthemorrhagic anemia: Secondary | ICD-10-CM

## 2017-07-13 LAB — CBC
HCT: 27.2 % — ABNORMAL LOW (ref 40.0–52.0)
Hemoglobin: 8.6 g/dL — ABNORMAL LOW (ref 13.0–18.0)
MCH: 29.5 pg (ref 26.0–34.0)
MCHC: 31.8 g/dL — ABNORMAL LOW (ref 32.0–36.0)
MCV: 92.7 fL (ref 80.0–100.0)
PLATELETS: 14 10*3/uL — AB (ref 150–440)
RBC: 2.93 MIL/uL — AB (ref 4.40–5.90)
RDW: 24 % — ABNORMAL HIGH (ref 11.5–14.5)
WBC: 44.9 10*3/uL — ABNORMAL HIGH (ref 3.8–10.6)

## 2017-07-13 LAB — BASIC METABOLIC PANEL
ANION GAP: 5 (ref 5–15)
BUN: 41 mg/dL — ABNORMAL HIGH (ref 6–20)
CALCIUM: 8.8 mg/dL — AB (ref 8.9–10.3)
CO2: 27 mmol/L (ref 22–32)
Chloride: 108 mmol/L (ref 101–111)
Creatinine, Ser: 1.2 mg/dL (ref 0.61–1.24)
GFR, EST NON AFRICAN AMERICAN: 57 mL/min — AB (ref >60)
Glucose, Bld: 106 mg/dL — ABNORMAL HIGH (ref 65–99)
Potassium: 3.6 mmol/L (ref 3.5–5.1)
Sodium: 140 mmol/L (ref 135–145)

## 2017-07-13 LAB — GLUCOSE, CAPILLARY
GLUCOSE-CAPILLARY: 273 mg/dL — AB (ref 65–99)
GLUCOSE-CAPILLARY: 277 mg/dL — AB (ref 65–99)
Glucose-Capillary: 106 mg/dL — ABNORMAL HIGH (ref 65–99)
Glucose-Capillary: 210 mg/dL — ABNORMAL HIGH (ref 65–99)

## 2017-07-13 LAB — MAGNESIUM: MAGNESIUM: 1.6 mg/dL — AB (ref 1.7–2.4)

## 2017-07-13 MED ORDER — MAGNESIUM SULFATE 2 GM/50ML IV SOLN
2.0000 g | Freq: Once | INTRAVENOUS | Status: AC
  Administered 2017-07-13: 13:00:00 2 g via INTRAVENOUS
  Filled 2017-07-13: qty 50

## 2017-07-13 NOTE — Progress Notes (Signed)
Scott [name]   DOB:10/12/1940   KL#:[id number]    Subjective: Thrombocytopenia/anemia/GI bleed Patient had IVIG infusion x1 yesterday; tolerated well without major side effects.  Had small amount of blood in the stools.  No profuse rectal bleeding. Denies any abdominal discomfort.  Objective:  Vitals:   07/13/17 1543 07/13/17 1612  BP: (!) 152/69 (!) 166/71  Pulse: (!) 58 (!) 56  Resp:    Temp:    SpO2: 98% 100%     Intake/Output Summary (Last 24 hours) at 07/13/2017 1654 Last data filed at 07/13/2017 1540 Gross per 24 hour  Intake 1660 ml  Output -  Net 1660 ml    GENERAL Alert, no distress and comfortable.  Thin built.  He is accompanied by his 2 sons. EYES: no pallor or icterus OROPHARYNX: no thrush or ulceration. NECK: supple, no masses felt LYMPH:  no palpable lymphadenopathy in the cervical, axillary or inguinal regions LUNGS: decreased breath sounds to auscultation at bases and  No wheeze or crackles HEART/CVS: regular rate & rhythm and no murmurs; No lower extremity edema ABDOMEN: abdomen soft, non-tender; no hepatosplenomegaly. Musculoskeletal:no cyanosis of digits and no clubbing  PSYCH: alert & oriented x 3 with fluent speech NEURO: no focal motor/sensory deficits SKIN:  no rashes or significant lesions   Labs:  Lab Results  Component Value Date   WBC 44.9 (H) 07/13/2017   HGB 8.6 (L) 07/13/2017   HCT 27.2 (L) 07/13/2017   MCV 92.7 07/13/2017   PLT 14 (LL) 07/13/2017   NEUTROABS 10.0 (H) 07/11/2017    Lab Results  Component Value Date   NA 140 07/13/2017   K 3.6 07/13/2017   CL 108 07/13/2017   CO2 27 07/13/2017    Studies:  No results found.  Assessment & Plan:   #77 year old male patient history of CLL-admitted the hospital for GI bleed/anemia/thrombocytopenia.  #Acute ITP CLL/-nonresponse to prednisone 70 mg a day status post IVIG-1 g/kg-platelets today 14 proceed with.  Infusion #2  #Severe anemia hemoglobin 8.5/likely secondary to  underlying CLL/mild GI bleed-monitor for now  #CLL-elevated white count 42~predominant lymphocytosis as expected of CLL.  Defer to patient's primary oncologist Dr. [name] for further management once acute issues resolve.    #Abdominal discomfort-question underlying CLL steroids/patient on PPI twice a day-improved.  #The above plan of care was discussed with the patient in detail./Also discussed with Dr.Salary. Cammie Sickle, MD 07/13/2017  4:54 PM

## 2017-07-13 NOTE — Progress Notes (Signed)
[facility] [place] [facility] [place] NAME: Scott [name]    MR#:  [id number]  DATE OF BIRTH:  04/24/1940  SUBJECTIVE:  CHIEF COMPLAINT:   Chief Complaint  Patient presents with  . Chest Pain  Patient without complaint, stools continue to look dark per patient, no events overnight per nursing staff, patient discussed with oncology/Dr. Brahmanday-continue IVIG, CBC in the morning, replete magnesium  REVIEW OF SYSTEMS:  CONSTITUTIONAL: No fever, fatigue or weakness.  EYES: No blurred or double vision.  EARS, NOSE, AND THROAT: No tinnitus or ear pain.  RESPIRATORY: No cough, shortness of breath, wheezing or hemoptysis.  CARDIOVASCULAR: No chest pain, orthopnea, edema.  GASTROINTESTINAL: No nausea, vomiting, diarrhea or abdominal pain.  GENITOURINARY: No dysuria, hematuria.  ENDOCRINE: No polyuria, nocturia,  HEMATOLOGY: No anemia, easy bruising or bleeding SKIN: No rash or lesion. MUSCULOSKELETAL: No joint pain or arthritis.   NEUROLOGIC: No tingling, numbness, weakness.  PSYCHIATRY: No anxiety or depression.   ROS  DRUG ALLERGIES:  No Known Allergies  VITALS:  Blood pressure 139/72, pulse (!) 55, temperature 98.1 F (36.7 C), temperature source Oral, resp. rate 15, height 5\' 10"  (1.778 m), weight 73.5 kg (162 lb 1.6 oz), SpO2 96 %.  PHYSICAL EXAMINATION:  GENERAL:  77 y.o.-year-old patient lying in the bed with no acute distress.  EYES: Pupils equal, round, reactive to light and accommodation. No scleral icterus. Extraocular muscles intact.  HEENT: Head atraumatic, normocephalic. Oropharynx and nasopharynx clear.  NECK:  Supple, no jugular venous distention. No thyroid enlargement, no tenderness.  LUNGS: Normal breath sounds bilaterally, no wheezing, rales,rhonchi or crepitation. No use of accessory muscles of respiration.  CARDIOVASCULAR: S1, S2 normal. No murmurs, rubs, or gallops.  ABDOMEN: Soft, nontender, nondistended. Bowel sounds present. No  organomegaly or mass.  EXTREMITIES: No pedal edema, cyanosis, or clubbing.  NEUROLOGIC: Cranial nerves II through XII are intact. Muscle strength 5/5 in all extremities. Sensation intact. Gait not checked.  PSYCHIATRIC: The patient is alert and oriented x 3.  SKIN: No obvious rash, lesion, or ulcer.   Physical Exam LABORATORY PANEL:   CBC Recent Labs  Lab 07/13/17 0829  WBC 44.9*  HGB 8.6*  HCT 27.2*  PLT 14*   ------------------------------------------------------------------------------------------------------------------  Chemistries  Recent Labs  Lab 07/12/17 0529 07/13/17 0829  NA 142 140  K 4.0 3.6  CL 111 108  CO2 24 27  GLUCOSE 287* 106*  BUN 46* 41*  CREATININE 1.49* 1.20  CALCIUM 8.5* 8.8*  MG 1.2* 1.6*  AST 24  --   ALT 19  --   ALKPHOS 50  --   BILITOT 0.8  --    ------------------------------------------------------------------------------------------------------------------  Cardiac Enzymes Recent Labs  Lab 07/08/17 1439  TROPONINI 0.04*   ------------------------------------------------------------------------------------------------------------------  RADIOLOGY:  No results found.  ASSESSMENT AND PLAN:  77 year old male patient with history of CLL, diabetes mellitus, hypertension currently in stepdown unit for severe low platelets and anemia.  - Acute anemia secondary to CLL and GI bleeding Stable Status post transfusion  Gastroenterology input appreciated - enndoscopy deferred due to thrombocytopenia Per oncology-status post IV iron infusion   -Severe thrombocytopenia Stable Secondary to ITP in setting of CLL-oncology input greatly appreciated/IVIG daily for 2 doses Persistent despite platelet transfusion  CBC in the morning  -Chronic lymphocytic leukemia Oncology follow-up with Dr. [name] status post discharge  -Chest pain  Resolved  likely due to severe anemia  - DM SSI  All the records are reviewed and case  discussed  with Care Management/Social Workerr. Management plans discussed with the patient, family and they are in agreement.  CODE STATUS: DNR  TOTAL TIME TAKING CARE OF THIS PATIENT: 35 minutes.     POSSIBLE D/C IN 1-3 DAYS, DEPENDING ON CLINICAL CONDITION.   Avel Peace Shaughnessy Gethers M.D on 07/13/2017   Between 7am to 6pm - Pager - [phone]  After 6pm go to [email or web address] - password [email or web address] [facility]  Office  [phone]  CC: Primary care physician; [name], MD  Note: This dictation was prepared with Dragon dictation along with smaller phrase technology. Any transcriptional errors that result from this process are unintentional.

## 2017-07-13 NOTE — Progress Notes (Signed)
  Lucilla Lame, MD [facility]   [address] [place], [place] [address] Phone: [phone] Fax : [phone]   Subjective: The patient continues to have those cytopenias despite platelet transfusion.  The patient's hemoglobin is slightly up to 8.6 today after having a blood transfusion.  He reports that his stools are dark but have gotten a bit lighter   Objective: Vital signs in last 24 hours: Vitals:   07/12/17 1602 07/12/17 2032 07/13/17 0300 07/13/17 0432  BP: (!) 164/71 134/75  139/72  Pulse: 61 62  (!) 55  Resp: 18 16  15   Temp: 98 F (36.7 C) 98 F (36.7 C)  98.1 F (36.7 C)  TempSrc: Oral Oral  Oral  SpO2: 100% 98%  96%  Weight:   162 lb 1.6 oz (73.5 kg)   Height:       Weight change: -9.6 oz (-0.272 kg)  Intake/Output Summary (Last 24 hours) at 07/13/2017 1322 Last data filed at 07/13/2017 1010 Gross per 24 hour  Intake 1420 ml  Output 225 ml  Net 1195 ml     Exam: General: Patient is alert and orientated 3 in no apparent distress Extremities without cyanosis clubbing or edema  Lab Results: @LABTEST2 @ Micro Results: Recent Results (from the past 240 hour(s))  MRSA PCR Screening     Status: None   Collection Time: 07/08/17 10:00 PM  Result Value Ref Range Status   MRSA by PCR NEGATIVE NEGATIVE Final    Comment:        The GeneXpert MRSA Assay (FDA approved for NASAL specimens only), is one component of a comprehensive MRSA colonization surveillance program. It is not intended to diagnose MRSA infection nor to guide or monitor treatment for MRSA infections. Performed at [facility], [address], [place],  [address]    Studies/Results: No results found. Medications: I have reviewed the patient's current medications. Scheduled Meds: . carvedilol  6.25 mg Oral BID  . furosemide  20 mg Oral Daily  . insulin aspart  0-15 Units Subcutaneous TID WC  . insulin aspart  0-5 Units Subcutaneous QHS  . insulin aspart  6 Units  Subcutaneous TID WC  . insulin detemir  13 Units Subcutaneous BID  . levothyroxine  50 mcg Oral QAC breakfast  . pantoprazole  40 mg Intravenous Q12H  . predniSONE  70 mg Oral Daily  . sodium chloride flush  3 mL Intravenous Q12H   Continuous Infusions: . sodium chloride    . IMMUNE GLOBULIN 10% (HUMAN) IV - For Fluid Restriction Only    . magnesium sulfate 1 - 4 g bolus IVPB 2 g (07/13/17 1309)   PRN Meds:.acetaminophen **OR** acetaminophen, albuterol, alum & mag hydroxide-simeth, hydrALAZINE, nitroGLYCERIN, ondansetron **OR** ondansetron (ZOFRAN) IV, oxyCODONE, oxymetazoline, traZODone   Assessment: Active Problems:   GI bleed    Plan: This patient has anemia with Thrombocytopenia and CLL.  The patient has been followed for a GI bleed.  The patient is stable and there is no plan to do any procedures on the patient with thrombocytopenia at this time.   LOS: 5 days   Lucilla Lame 07/13/2017, 1:22 PM

## 2017-07-14 DIAGNOSIS — D500 Iron deficiency anemia secondary to blood loss (chronic): Secondary | ICD-10-CM

## 2017-07-14 LAB — CBC
HEMATOCRIT: 24.8 % — AB (ref 40.0–52.0)
Hemoglobin: 8 g/dL — ABNORMAL LOW (ref 13.0–18.0)
MCH: 30.2 pg (ref 26.0–34.0)
MCHC: 32.4 g/dL (ref 32.0–36.0)
MCV: 93 fL (ref 80.0–100.0)
PLATELETS: 15 10*3/uL — AB (ref 150–440)
RBC: 2.66 MIL/uL — ABNORMAL LOW (ref 4.40–5.90)
RDW: 23.3 % — AB (ref 11.5–14.5)
WBC: 32.2 10*3/uL — AB (ref 3.8–10.6)

## 2017-07-14 LAB — DIRECT PLATELET ANTIBODY

## 2017-07-14 LAB — GLUCOSE, CAPILLARY
Glucose-Capillary: 213 mg/dL — ABNORMAL HIGH (ref 65–99)
Glucose-Capillary: 135 mg/dL — ABNORMAL HIGH (ref 65–99)
Glucose-Capillary: 122 mg/dL — ABNORMAL HIGH (ref 65–99)
Glucose-Capillary: 270 mg/dL — ABNORMAL HIGH (ref 65–99)

## 2017-07-14 LAB — MAGNESIUM: MAGNESIUM: 1.7 mg/dL (ref 1.7–2.4)

## 2017-07-14 MED ORDER — VITAMIN B-12 1000 MCG PO TABS
1000.0000 ug | ORAL_TABLET | Freq: Every day | ORAL | Status: DC
  Administered 2017-07-14 – 2017-07-15 (×2): 1000 ug via ORAL
  Filled 2017-07-14 (×2): qty 1

## 2017-07-14 MED ORDER — SODIUM CHLORIDE 0.9 % IV SOLN
510.0000 mg | Freq: Once | INTRAVENOUS | Status: AC
  Administered 2017-07-14: 510 mg via INTRAVENOUS
  Filled 2017-07-14: qty 17

## 2017-07-14 MED ORDER — METHYLPREDNISOLONE SODIUM SUCC 125 MG IJ SOLR
125.0000 mg | Freq: Every day | INTRAMUSCULAR | Status: AC
  Administered 2017-07-14 – 2017-07-15 (×2): 125 mg via INTRAVENOUS
  Filled 2017-07-14 (×2): qty 2

## 2017-07-14 NOTE — Progress Notes (Signed)
[place]  [phone] [phone]  ID: Scott [name] OB: [birth year]  MR#: [id number]  QIH#:[id number]  Patient Care Team: [name], MD as PCP - General (Family Medicine) Lloyd Huger, MD as Consulting Physician (Oncology) Clent Jacks, MD as Consulting Physician (Ophthalmology) Fleet Contras, MD as Consulting Physician (Nephrology) Dorothy Spark, MD as Consulting Physician (Cardiology)  CHIEF COMPLAINT: CLL, severe anemia and thrombocytopenia  INTERVAL HISTORY: Patient is significantly improved and feels nearly back to his baseline.  He has noted no further blood in his stool.  His platelets remain decreased despite transfusions, high-dose prednisone, and IVIG.   REVIEW OF SYSTEMS:   Review of Systems  Constitutional: Negative.  Negative for fever, malaise/fatigue and weight loss.  Respiratory: Negative.  Negative for cough and shortness of breath.   Cardiovascular: Negative.  Negative for chest pain and leg swelling.  Gastrointestinal: Negative.  Negative for abdominal pain, blood in stool and melena.  Genitourinary: Negative.   Musculoskeletal: Negative.   Skin: Negative.  Negative for rash.  Neurological: Negative.  Negative for sensory change, focal weakness and weakness.  Endo/Heme/Allergies: Does not bruise/bleed easily.  Psychiatric/Behavioral: Negative.  The patient is not nervous/anxious.     As per HPI. Otherwise, a complete review of systems is negative.  PAST MEDICAL HISTORY: Past Medical History:  Diagnosis Date  . Acquired hand deformity 1962   hand saw accident at work  . Anemia 10/11/2011  . Arthritis   . Bradycardia 10/10/2011  . CAD (coronary artery disease) 10/10/2011   MI s/p PTCA (Dx-OM2 proximal concentric stenosis)  . CKD (chronic kidney disease) stage 3, GFR 30-59 ml/min (HCC)    [name]  . CLL (chronic lymphocytic leukemia) ([place])   . Colon polyps   . Generalized headaches    frequent  . Glaucoma    s/p laser surgery  . HLD (hyperlipidemia)   . Hypertension   . Hypothyroidism 10/10/2011  . Thrombocytopenia ([facility]) 10/11/2011  . Type 2 diabetes with nephropathy [facility])    DM refresher course [facility] (04/2013)    PAST SURGICAL HISTORY: Past Surgical History:  Procedure Laterality Date  . BACK SURGERY     cervical neck  . CATARACT EXTRACTION, BILATERAL Bilateral 2017  . COLONOSCOPY  11/2008   1 polyp, diverticulosis, rec rpt 5 yrs (Dr. Oletta Lamas, [name])  . COLONOSCOPY  06/2014   hyperplastic polyp, rpt 5 yrs ([place])  . EYE SURGERY  2012   laser surgery for glaucoma  . PTCA  1994, 1995  . US ECHOCARDIOGRAPHY  10/2013   inferior wall hypokinesis, mild LVH, EF 50-55%, mild MR and LA dilation    FAMILY HISTORY: Family History  Problem Relation Age of Onset  . Diabetes Sister   . Stomach cancer Sister 70  . Pneumonia Father        caused death  . Other Brother        no communication with brother so unsure of any health conditions  . Coronary artery disease Son 46       5v CABG and stents  . Hyperlipidemia Sister   . Stroke Neg Hx   . Heart attack Neg Hx     ADVANCED DIRECTIVES (Y/N):  '@ADVDIR' @  HEALTH MAINTENANCE: Social History   Tobacco Use  . Smoking status: Former Smoker    Last attempt to quit: 03/04/1978    Years since quitting: 39.3  . Smokeless tobacco: Never Used  Substance Use Topics  . Alcohol use: No  . Drug use:  No     Colonoscopy:  PAP:  Bone density:  Lipid panel:  No Known Allergies  Current Facility-Administered Medications  Medication Dose Route Frequency Provider Last Rate Last Dose  . 0.9 %  sodium chloride infusion   Intravenous Once Conforti, John, DO      . acetaminophen (TYLENOL) tablet 650 mg  650 mg Oral Q6H PRN Hillary Bow, MD   650 mg at 07/14/17 1696   Or  . acetaminophen (TYLENOL) suppository 650 mg  650 mg Rectal Q6H PRN Sudini, Alveta Heimlich, MD      . albuterol (PROVENTIL) (2.5 MG/3ML) 0.083% nebulizer solution  2.5 mg  2.5 mg Nebulization Q2H PRN Sudini, Alveta Heimlich, MD      . alum & mag hydroxide-simeth (MAALOX/MYLANTA) 200-200-20 MG/5ML suspension 15 mL  15 mL Oral Q6H PRN Charlaine Dalton R, MD      . carvedilol (COREG) tablet 6.25 mg  6.25 mg Oral BID Hillary Bow, MD   6.25 mg at 07/14/17 0908  . furosemide (LASIX) tablet 20 mg  20 mg Oral Daily Hillary Bow, MD   20 mg at 07/14/17 0908  . hydrALAZINE (APRESOLINE) injection 10 mg  10 mg Intravenous Q4H PRN Tukov-Yual, Magdalene S, NP      . insulin aspart (novoLOG) injection 0-15 Units  0-15 Units Subcutaneous TID WC Saundra Shelling, MD   2 Units at 07/14/17 1234  . insulin aspart (novoLOG) injection 0-5 Units  0-5 Units Subcutaneous QHS Saundra Shelling, MD   2 Units at 07/13/17 2120  . insulin aspart (novoLOG) injection 6 Units  6 Units Subcutaneous TID WC Hillary Bow, MD   6 Units at 07/14/17 1233  . insulin detemir (LEVEMIR) injection 13 Units  13 Units Subcutaneous BID Hillary Bow, MD   13 Units at 07/14/17 [phone]  . levothyroxine (SYNTHROID, LEVOTHROID) tablet 50 mcg  50 mcg Oral QAC breakfast Hillary Bow, MD   50 mcg at 07/14/17 0908  . methylPREDNISolone sodium succinate (SOLU-MEDROL) 125 mg/2 mL injection 125 mg  125 mg Intravenous Daily Lloyd Huger, MD      . nitroGLYCERIN (NITROSTAT) SL tablet 0.4 mg  0.4 mg Sublingual Q5 min PRN Sudini, Alveta Heimlich, MD      . ondansetron (ZOFRAN) tablet 4 mg  4 mg Oral Q6H PRN Hillary [place], MD       Or  . ondansetron (ZOFRAN) injection 4 mg  4 mg Intravenous Q6H PRN Sudini, Srikar, MD      . oxyCODONE (Oxy IR/ROXICODONE) immediate release tablet 5 mg  5 mg Oral Q4H PRN Sudini, Srikar, MD      . oxymetazoline (AFRIN) 0.05 % nasal spray 1 spray  1 spray Each Nare QHS PRN Awilda Bill, NP      . pantoprazole (PROTONIX) injection 40 mg  40 mg Intravenous Q12H Hillary Bow, MD   40 mg at 07/14/17 0909  . sodium chloride flush (NS) 0.9 % injection 3 mL  3 mL Intravenous Q12H Hillary Bow, MD    3 mL at 07/14/17 0910  . traZODone (DESYREL) tablet 50 mg  50 mg Oral QHS PRN Hillary Bow, MD   50 mg at 07/14/17 0030    OBJECTIVE: Vitals:   07/14/17 0407 07/14/17 1016  BP: (!) 147/73 136/82  Pulse: (!) 56 62  Resp: 15 18  Temp: (!) 97.5 F (36.4 C) 98.4 F (36.9 C)  SpO2: 99% 98%     Body mass index is 22.97 kg/m.    ECOG FS:0 - Asymptomatic  General:  Well-developed, well-nourished, no acute distress. Eyes: Pink conjunctiva, anicteric sclera. Lungs: Clear to auscultation bilaterally. Heart: Regular rate and rhythm. No rubs, murmurs, or gallops. Abdomen: Soft, nontender, nondistended. No organomegaly noted, normoactive bowel sounds. Musculoskeletal: No edema, cyanosis, or clubbing. Neuro: Alert, answering all questions appropriately. Cranial nerves grossly intact. Skin: No rashes or petechiae noted. Psych: Normal affect.   LAB RESULTS:  Lab Results  Component Value Date   NA 140 07/13/2017   K 3.6 07/13/2017   CL 108 07/13/2017   CO2 27 07/13/2017   GLUCOSE 106 (H) 07/13/2017   BUN 41 (H) 07/13/2017   CREATININE 1.20 07/13/2017   CALCIUM 8.8 (L) 07/13/2017   PROT 5.4 (L) 07/12/2017   ALBUMIN 3.3 (L) 07/12/2017   AST 24 07/12/2017   ALT 19 07/12/2017   ALKPHOS 50 07/12/2017   BILITOT 0.8 07/12/2017   GFRNONAA 57 (L) 07/13/2017   GFRAA >60 07/13/2017    Lab Results  Component Value Date   WBC 32.2 (H) 07/14/2017   NEUTROABS 10.0 (H) 07/11/2017   HGB 8.0 (L) 07/14/2017   HCT 24.8 (L) 07/14/2017   MCV 93.0 07/14/2017   PLT 15 (LL) 07/14/2017     STUDIES: Dg Chest 2 View  Result Date: 07/08/2017 CLINICAL DATA:  Chest pain EXAM: CHEST - 2 VIEW COMPARISON:  October 21, 2015 FINDINGS: The heart size and mediastinal contours are within normal limits. Both lungs are clear. The visualized skeletal structures are unremarkable. IMPRESSION: No active cardiopulmonary disease. Electronically Signed   By: Dorise Bullion III M.D   On: 07/08/2017 15:35     ASSESSMENT: CLL, severe anemia and thrombocytopenia  PLAN:    1.  CLL: Patient's white blood cell remains increased, but essentially stable.  No intervention needed at this time.  Patient has been instructed to keep his follow-up appointment in the cancer center on Thursday, Jul 17, 2017.  Repeat CBC in morning.   2.  Anemia: No evidence of TTP or DIC.  Patient's hemoglobin remains decreased, but stable.  He has not required a blood transfusion in several days.  Given his iron deficiency, he may benefit from IV iron in the near future.  Patient will require luminal evaluation once his platelets have improved. 3.  Thrombocytopenia: Only mild improvement with high-dose prednisone, IVIG, and transfusions.  Have discontinued oral prednisone and patient will receive daily 125 mg IV Solu-Medrol instead.  If platelet count remains the same or is improved tomorrow morning, patient can be discharged.  If platelet count decreases, will consider bone marrow biopsy in the next several days.  Patient has been instructed to keep follow-up on Thursday as above.     Will follow.    Lloyd Huger, MD   07/14/2017 1:08 PM

## 2017-07-14 NOTE — Progress Notes (Signed)
    Scott Darby, MD [address]  [name]  [name], [name] [address]  Main: [phone]  Fax: [phone] Pager: [phone]   Subjective: Reports light brown stools today. Feels well Family is bedside   Objective: Vital signs in last 24 hours: Vitals:   07/14/17 0407 07/14/17 1016 07/14/17 1341 07/14/17 1928  BP: (!) 147/73 136/82 120/71 (!) 148/81  Pulse: (!) 56 62 61 63  Resp: 15 18  14   Temp: (!) 97.5 F (36.4 C) 98.4 F (36.9 C) (!) 97.3 F (36.3 C) 97.8 F (36.6 C)  TempSrc: Oral  Oral Oral  SpO2: 99% 98% 99% 99%  Weight: 160 lb 1.6 oz (72.6 kg)     Height:       Weight change: -2 lb (-0.907 kg)  Intake/Output Summary (Last 24 hours) at 07/14/2017 1932 Last data filed at 07/14/2017 1901 Gross per 24 hour  Intake 1200 ml  Output -  Net 1200 ml     Exam: Heart:: Regular rate and rhythm or S1S2 present Lungs: clear to auscultation Abdomen: soft, nontender, normal bowel sounds   Lab Results: CBC Latest Ref Rng & Units 07/14/2017 07/13/2017 07/12/2017  WBC 3.8 - 10.6 K/uL 32.2(H) 44.9(H) 44.0(H)  Hemoglobin 13.0 - 18.0 g/dL 8.0(L) 8.6(L) 7.9(L)  Hematocrit 40.0 - 52.0 % 24.8(L) 27.2(L) 25.1(L)  Platelets 150 - 440 K/uL 15(LL) 14(LL) 12(LL)   Micro Results: Recent Results (from the past 240 hour(s))  MRSA PCR Screening     Status: None   Collection Time: 07/08/17 10:00 PM  Result Value Ref Range Status   MRSA by PCR NEGATIVE NEGATIVE Final    Comment:        The GeneXpert MRSA Assay (FDA approved for NASAL specimens only), is one component of a comprehensive MRSA colonization surveillance program. It is not intended to diagnose MRSA infection nor to guide or monitor treatment for MRSA infections. Performed at [facility], [address] [place], [place] [address]    Studies/Results: No results found. Medications: I have reviewed the patient's current medications. Scheduled Meds: . carvedilol  6.25 mg Oral BID  .  furosemide  20 mg Oral Daily  . insulin aspart  0-15 Units Subcutaneous TID WC  . insulin aspart  0-5 Units Subcutaneous QHS  . insulin aspart  6 Units Subcutaneous TID WC  . insulin detemir  13 Units Subcutaneous BID  . levothyroxine  50 mcg Oral QAC breakfast  . methylPREDNISolone (SOLU-MEDROL) injection  125 mg Intravenous Daily  . pantoprazole  40 mg Intravenous Q12H  . sodium chloride flush  3 mL Intravenous Q12H   Continuous Infusions: . sodium chloride     PRN Meds:.acetaminophen **OR** acetaminophen, albuterol, alum & mag hydroxide-simeth, hydrALAZINE, nitroGLYCERIN, ondansetron **OR** ondansetron (ZOFRAN) IV, oxyCODONE, oxymetazoline, traZODone   Assessment: Active Problems:   GI bleed   Acute blood loss anemia  Has iron deficiency and mild B12 deficiency anemia  No evidence of active GI bleed  Plan: Recommend parenteral iron therapy, ferriheme ordered Recommend to start oral B12 1032mcg daily Follow up with GI as outpatient after improvement of thrombocytopenia for endoscopic evaluation  GI will sign off for now, please call us back with questions/concerns   LOS: 6 days   Scott [name] 07/14/2017, 7:32 PM

## 2017-07-14 NOTE — Progress Notes (Signed)
[place] [place] NAME: Laithan Conchas    MR#:  [id number]  DATE OF BIRTH:  10-05-1940  SUBJECTIVE:  CHIEF COMPLAINT:   Chief Complaint  Patient presents with  . Chest Pain  Patient without complaint, case discussed with oncology, continue hospitalization given critically low platelet count, gastroenterology input appreciated  REVIEW OF SYSTEMS:  CONSTITUTIONAL: No fever, fatigue or weakness.  EYES: No blurred or double vision.  EARS, NOSE, AND THROAT: No tinnitus or ear pain.  RESPIRATORY: No cough, shortness of breath, wheezing or hemoptysis.  CARDIOVASCULAR: No chest pain, orthopnea, edema.  GASTROINTESTINAL: No nausea, vomiting, diarrhea or abdominal pain.  GENITOURINARY: No dysuria, hematuria.  ENDOCRINE: No polyuria, nocturia,  HEMATOLOGY: No anemia, easy bruising or bleeding SKIN: No rash or lesion. MUSCULOSKELETAL: No joint pain or arthritis.   NEUROLOGIC: No tingling, numbness, weakness.  PSYCHIATRY: No anxiety or depression.   ROS  DRUG ALLERGIES:  No Known Allergies  VITALS:  Blood pressure 136/82, pulse 62, temperature 98.4 F (36.9 C), resp. rate 18, height '5\' 10"'  (1.778 m), weight 72.6 kg (160 lb 1.6 oz), SpO2 98 %.  PHYSICAL EXAMINATION:  GENERAL:  77 y.o.-year-old patient lying in the bed with no acute distress.  EYES: Pupils equal, round, reactive to light and accommodation. No scleral icterus. Extraocular muscles intact.  HEENT: Head atraumatic, normocephalic. Oropharynx and nasopharynx clear.  NECK:  Supple, no jugular venous distention. No thyroid enlargement, no tenderness.  LUNGS: Normal breath sounds bilaterally, no wheezing, rales,rhonchi or crepitation. No use of accessory muscles of respiration.  CARDIOVASCULAR: S1, S2 normal. No murmurs, rubs, or gallops.  ABDOMEN: Soft, nontender, nondistended. Bowel sounds present. No organomegaly or mass.  EXTREMITIES: No pedal edema, cyanosis, or clubbing.   NEUROLOGIC: Cranial nerves II through XII are intact. Muscle strength 5/5 in all extremities. Sensation intact. Gait not checked.  PSYCHIATRIC: The patient is alert and oriented x 3.  SKIN: No obvious rash, lesion, or ulcer.   Physical Exam LABORATORY PANEL:   CBC Recent Labs  Lab 07/14/17 0415  WBC 32.2*  HGB 8.0*  HCT 24.8*  PLT 15*   ------------------------------------------------------------------------------------------------------------------  Chemistries  Recent Labs  Lab 07/12/17 0529 07/13/17 0829 07/14/17 0415  NA 142 140  --   K 4.0 3.6  --   CL 111 108  --   CO2 24 27  --   GLUCOSE 287* 106*  --   BUN 46* 41*  --   CREATININE 1.49* 1.20  --   CALCIUM 8.5* 8.8*  --   MG 1.2* 1.6* 1.7  AST 24  --   --   ALT 19  --   --   ALKPHOS 50  --   --   BILITOT 0.8  --   --    ------------------------------------------------------------------------------------------------------------------  Cardiac Enzymes Recent Labs  Lab 07/08/17 1439  TROPONINI 0.04*   ------------------------------------------------------------------------------------------------------------------  RADIOLOGY:  No results found.  ASSESSMENT AND PLAN:  77 year old male patient with history of CLL, diabetes mellitus, hypertension currently in stepdown unit for severe low platelets and anemia.  - Acute anemia secondary to CLL and GI bleeding Stable S/p transfusion  Gastroenterology input appreciated-no enndoscopy given continued severe thrombocytopenia  Per oncology-status post IV iron infusion, check CBC in the morning   -Severe thrombocytopenia Stable Treatment resistant/no improvement with high-dose prednisone, received IVIG x2, and s/p platelet transfusions Secondary to ITP in setting of CLL Case discussed with oncology-IV Solu-Medrol for now, continue hospitalization, check CBC in  the morning-if platelet count is improved for the same patient can be discharged home, if  decreased patient will be considered for bone marrow biopsy in the next several days, outpatient follow-up appointment planned for on Thursday with Dr. [name]  -Chronic lymphocytic leukemia Oncology following-there input is greatly appreciated   -Chest pain Resolved likely due to severe anemia  - DM SSI  Disposition pending clinical course to home   All the records are reviewed and case discussed with Care Management/Social Workerr. Management plans discussed with the patient, family and they are in agreement.  CODE STATUS: DNR  TOTAL TIME TAKING CARE OF THIS PATIENT: 35 minutes.     POSSIBLE D/C IN 1-3 DAYS, DEPENDING ON CLINICAL CONDITION.   Avel Peace Salary M.D on 07/14/2017   Between 7am to 6pm - Pager - [phone]  After 6pm go to [email or web address] - password [email or web address] [facility]  Office  [phone]  CC: Primary care physician; [name], MD  Note: This dictation was prepared with Dragon dictation along with smaller phrase technology. Any transcriptional errors that result from this process are unintentional.

## 2017-07-14 NOTE — Progress Notes (Signed)
PT Cancellation Note  Patient Details Name: Scott [name] MRN: [id number] DOB: 1940-04-23   Cancelled Treatment:    Reason Eval/Treat Not Completed: Medical issues which prohibited therapy; Platelets currently at 15 which is below cutoff value for participation with PT services.  Will follow pt and attempt to see pt at a future date as medically appropriate.     Linus Salmons PT, DPT 07/14/17, 8:47 AM

## 2017-07-14 NOTE — Care Management Important Message (Signed)
Copy of signed IM left in patient's room.    

## 2017-07-14 NOTE — Plan of Care (Signed)
  Problem: Health Behavior/Discharge Planning: Goal: Ability to manage health-related needs will improve Outcome: Progressing   Problem: Activity: Goal: Risk for activity intolerance will decrease Outcome: Progressing   Problem: Coping: Goal: Level of anxiety will decrease Outcome: Progressing   Problem: Safety: Goal: Ability to remain free from injury will improve Outcome: Progressing   

## 2017-07-15 ENCOUNTER — Other Ambulatory Visit: Payer: Self-pay | Admitting: Cardiology

## 2017-07-15 DIAGNOSIS — E785 Hyperlipidemia, unspecified: Secondary | ICD-10-CM

## 2017-07-15 DIAGNOSIS — E7849 Other hyperlipidemia: Secondary | ICD-10-CM

## 2017-07-15 DIAGNOSIS — I10 Essential (primary) hypertension: Secondary | ICD-10-CM

## 2017-07-15 DIAGNOSIS — I2510 Atherosclerotic heart disease of native coronary artery without angina pectoris: Secondary | ICD-10-CM

## 2017-07-15 LAB — CBC WITH DIFFERENTIAL/PLATELET
BASOS ABS: 0 10*3/uL (ref 0–0.1)
Basophils Relative: 0 %
EOS ABS: 0 10*3/uL (ref 0–0.7)
Eosinophils Relative: 0 %
HEMATOCRIT: 25.6 % — AB (ref 40.0–52.0)
HEMOGLOBIN: 8.3 g/dL — AB (ref 13.0–18.0)
LYMPHS ABS: 26.9 10*3/uL — AB (ref 1.0–3.6)
Lymphocytes Relative: 82 %
MCH: 30 pg (ref 26.0–34.0)
MCHC: 32.4 g/dL (ref 32.0–36.0)
MCV: 92.7 fL (ref 80.0–100.0)
MONO ABS: 0.3 10*3/uL (ref 0.2–1.0)
Monocytes Relative: 1 %
NEUTROS PCT: 17 %
Neutro Abs: 5.6 10*3/uL (ref 1.4–6.5)
Platelets: 20 10*3/uL — CL (ref 150–440)
RBC: 2.76 MIL/uL — ABNORMAL LOW (ref 4.40–5.90)
RDW: 22.9 % — ABNORMAL HIGH (ref 11.5–14.5)
WBC: 32.8 10*3/uL — AB (ref 3.8–10.6)

## 2017-07-15 LAB — GLUCOSE, CAPILLARY
GLUCOSE-CAPILLARY: 160 mg/dL — AB (ref 65–99)
Glucose-Capillary: 179 mg/dL — ABNORMAL HIGH (ref 65–99)

## 2017-07-15 MED ORDER — PANTOPRAZOLE SODIUM 40 MG PO TBEC: 40.0000 mg | DELAYED_RELEASE_TABLET | Freq: Every day | ORAL | 0 refills | Status: DC

## 2017-07-15 MED ORDER — CYANOCOBALAMIN 1000 MCG PO TABS: 1000.0000 ug | ORAL_TABLET | Freq: Every day | ORAL | 0 refills | Status: DC

## 2017-07-15 NOTE — Progress Notes (Signed)
Discussed discharge instructions and medications with patient and his son. IV removed. All questions addressed. Patient transported home via car by his son.  Clarise Cruz, RN

## 2017-07-15 NOTE — Discharge Summary (Signed)
[facility] [place] NAME: Scott [name]    MR#:  [id number]  DATE OF BIRTH:  02-25-1941  DATE OF ADMISSION:  07/08/2017 ADMITTING PHYSICIAN: Hillary Bow, MD  DATE OF DISCHARGE: 07/15/2017  PRIMARY CARE PHYSICIAN: [name], MD    ADMISSION DIAGNOSIS:  Acute blood loss anemia [D62] Weakness [R53.1] Thrombocytopenia (HCC) [D69.6]  DISCHARGE DIAGNOSIS:  Active Problems:   GI bleed   Acute blood loss anemia   SECONDARY DIAGNOSIS:   Past Medical History:  Diagnosis Date  . Acquired hand deformity 1962   hand saw accident at work  . Anemia 10/11/2011  . Arthritis   . Bradycardia 10/10/2011  . CAD (coronary artery disease) 10/10/2011   MI s/p PTCA (Dx-OM2 proximal concentric stenosis)  . CKD (chronic kidney disease) stage 3, GFR 30-59 ml/min (HCC)    [name]  . CLL (chronic lymphocytic leukemia) ([place])   . Colon polyps   . Generalized headaches    frequent  . Glaucoma    s/p laser surgery  . HLD (hyperlipidemia)   . Hypertension   . Hypothyroidism 10/10/2011  . Thrombocytopenia ([place]) 10/11/2011  . Type 2 diabetes with nephropathy [facility])    DM refresher course [place] (04/2013)    HOSPITAL COURSE:  77 year old male with a history of CLL, diabetes and ITP who presented to the emergency room due to low platelets and anemia.  1.  Acute on chronic anemia due to CLL and GI bleed: Patient was evaluated by GI.  No endoscopy given severe thrombocytopenia.  Patient can follow-up with GI as an outpatient if indicated.  His hemoglobin remained stable. He did receive IV iron as recommended by GI.  2.  Severe thrombocytopenia with history of ITP: Patient was on IV Solu-Medrol as well as IVIG.  His platelet count has improved.  He will have outpatient follow-up with Dr. [name].  It is advised that patient not use scissors, knives and or any sharp objects including razors for now.   3.  CLL: Patient will follow-up with oncology. 4.   Diabetes: Patient may resume outpatient medications 5.  Essential hypertension: Patient will remain on outpatient medications.  6.  B12 deficiency: Patient is started on B12 supplements.  DISCHARGE CONDITIONS AND DIET:   Stable for discharge on heart healthy diet  CONSULTS OBTAINED:  Treatment Team:  Jonathon Bellows, MD Lloyd Huger, MD  DRUG ALLERGIES:  No Known Allergies  DISCHARGE MEDICATIONS:   Allergies as of 07/15/2017   No Known Allergies     Medication List    TAKE these medications   acetaminophen 650 MG CR tablet Commonly known as:  TYLENOL Take 650 mg by mouth every 8 (eight) hours as needed for pain.   aspirin EC 81 MG tablet Take 1 tablet (81 mg total) by mouth daily.   atorvastatin 80 MG tablet Commonly known as:  LIPITOR Take 1 tablet (80 mg total) by mouth daily.   carvedilol 6.25 MG tablet Commonly known as:  COREG TAKE 1 TABLET BY MOUTH TWICE DAILY   cyanocobalamin 1000 MCG tablet Take 1 tablet (1,000 mcg total) by mouth daily. Start taking on:  07/16/2017   diclofenac sodium 1 % Gel Commonly known as:  VOLTAREN Apply topically 4 (four) times daily.   ferrous sulfate 325 (65 FE) MG tablet Take 325 mg by mouth daily with breakfast.   furosemide 20 MG tablet Commonly known as:  LASIX Take 1 tablet (20 mg total) by mouth daily.  glucose blood test strip Commonly known as:  ONE TOUCH ULTRA TEST USE ONE STRIP TO CHECK GLUCOSE TWICE DAILY AND  AS  NEEDED. Dx code:  E11.21   insulin aspart 100 UNIT/ML FlexPen Commonly known as:  NOVOLOG Inject 3-7 Units into the skin 3 (three) times daily with meals.   Insulin Detemir 100 UNIT/ML Pen Commonly known as:  LEVEMIR FLEXTOUCH Inject 25 Units into the skin daily at 10 pm.   levothyroxine 50 MCG tablet Commonly known as:  SYNTHROID, LEVOTHROID TAKE 1 TABLET BY MOUTH ONCE DAILY   lisinopril 10 MG tablet Commonly known as:  PRINIVIL,ZESTRIL Take 1 tablet (10 mg total) by mouth daily.    niacin 500 MG tablet Commonly known as:  SLO-NIACIN Take 1,000 mg by mouth at bedtime.   nitroGLYCERIN 0.4 MG SL tablet Commonly known as:  NITROSTAT Place 1 tablet (0.4 mg total) under the tongue every 5 (five) minutes as needed for chest pain ((MAX of 3 doses)).   pantoprazole 40 MG tablet Commonly known as:  PROTONIX Take 1 tablet (40 mg total) by mouth daily.   traZODone 50 MG tablet Commonly known as:  DESYREL TAKE 1/2 TO 1 (ONE-HALF TO ONE) TABLET BY MOUTH AT BEDTIME AS NEEDED FOR SLEEP         Today   CHIEF COMPLAINT:   Patient ready to be discharged home today no acute events overnight.   VITAL SIGNS:  Blood pressure (!) 154/73, pulse (!) 103, temperature 98 F (36.7 C), temperature source Oral, resp. rate 14, height 5\' 10"  (1.778 m), weight 73.2 kg (161 lb 6.4 oz), SpO2 94 %.   REVIEW OF SYSTEMS:  Review of Systems  Constitutional: Negative.  Negative for chills, fever and malaise/fatigue.  HENT: Negative.  Negative for ear discharge, ear pain, hearing loss, nosebleeds and sore throat.   Eyes: Negative.  Negative for blurred vision and pain.  Respiratory: Negative.  Negative for cough, hemoptysis, shortness of breath and wheezing.   Cardiovascular: Negative.  Negative for chest pain, palpitations and leg swelling.  Gastrointestinal: Negative.  Negative for abdominal pain, blood in stool, diarrhea, nausea and vomiting.  Genitourinary: Negative.  Negative for dysuria.  Musculoskeletal: Negative.  Negative for back pain.  Skin: Negative.   Neurological: Negative for dizziness, tremors, speech change, focal weakness, seizures and headaches.  Endo/Heme/Allergies: Negative.  Does not bruise/bleed easily.  Psychiatric/Behavioral: Negative.  Negative for depression, hallucinations and suicidal ideas.     PHYSICAL EXAMINATION:  GENERAL:  77 y.o.-year-old patient lying in the bed with no acute distress.  NECK:  Supple, no jugular venous distention. No thyroid  enlargement, no tenderness.  LUNGS: Normal breath sounds bilaterally, no wheezing, rales,rhonchi  No use of accessory muscles of respiration.  CARDIOVASCULAR: S1, S2 normal. No murmurs, rubs, or gallops.  ABDOMEN: Soft, non-tender, non-distended. Bowel sounds present. No organomegaly or mass.  EXTREMITIES: No pedal edema, cyanosis, or clubbing.  PSYCHIATRIC: The patient is alert and oriented x 3.  SKIN: No obvious rash, lesion, or ulcer.   DATA REVIEW:   CBC Recent Labs  Lab 07/15/17 0335  WBC 32.8*  HGB 8.3*  HCT 25.6*  PLT 20*    Chemistries  Recent Labs  Lab 07/12/17 0529 07/13/17 0829 07/14/17 0415  NA 142 140  --   K 4.0 3.6  --   CL 111 108  --   CO2 24 27  --   GLUCOSE 287* 106*  --   BUN 46* 41*  --   CREATININE  1.49* 1.20  --   CALCIUM 8.5* 8.8*  --   MG 1.2* 1.6* 1.7  AST 24  --   --   ALT 19  --   --   ALKPHOS 50  --   --   BILITOT 0.8  --   --     Cardiac Enzymes Recent Labs  Lab 07/08/17 1439  TROPONINI 0.04*    Microbiology Results  @MICRORSLT48 @  RADIOLOGY:  No results found.    Allergies as of 07/15/2017   No Known Allergies     Medication List    TAKE these medications   acetaminophen 650 MG CR tablet Commonly known as:  TYLENOL Take 650 mg by mouth every 8 (eight) hours as needed for pain.   aspirin EC 81 MG tablet Take 1 tablet (81 mg total) by mouth daily.   atorvastatin 80 MG tablet Commonly known as:  LIPITOR Take 1 tablet (80 mg total) by mouth daily.   carvedilol 6.25 MG tablet Commonly known as:  COREG TAKE 1 TABLET BY MOUTH TWICE DAILY   cyanocobalamin 1000 MCG tablet Take 1 tablet (1,000 mcg total) by mouth daily. Start taking on:  07/16/2017   diclofenac sodium 1 % Gel Commonly known as:  VOLTAREN Apply topically 4 (four) times daily.   ferrous sulfate 325 (65 FE) MG tablet Take 325 mg by mouth daily with breakfast.   furosemide 20 MG tablet Commonly known as:  LASIX Take 1 tablet (20 mg total) by  mouth daily.   glucose blood test strip Commonly known as:  ONE TOUCH ULTRA TEST USE ONE STRIP TO CHECK GLUCOSE TWICE DAILY AND  AS  NEEDED. Dx code:  E11.21   insulin aspart 100 UNIT/ML FlexPen Commonly known as:  NOVOLOG Inject 3-7 Units into the skin 3 (three) times daily with meals.   Insulin Detemir 100 UNIT/ML Pen Commonly known as:  LEVEMIR FLEXTOUCH Inject 25 Units into the skin daily at 10 pm.   levothyroxine 50 MCG tablet Commonly known as:  SYNTHROID, LEVOTHROID TAKE 1 TABLET BY MOUTH ONCE DAILY   lisinopril 10 MG tablet Commonly known as:  PRINIVIL,ZESTRIL Take 1 tablet (10 mg total) by mouth daily.   niacin 500 MG tablet Commonly known as:  SLO-NIACIN Take 1,000 mg by mouth at bedtime.   nitroGLYCERIN 0.4 MG SL tablet Commonly known as:  NITROSTAT Place 1 tablet (0.4 mg total) under the tongue every 5 (five) minutes as needed for chest pain ((MAX of 3 doses)).   pantoprazole 40 MG tablet Commonly known as:  PROTONIX Take 1 tablet (40 mg total) by mouth daily.   traZODone 50 MG tablet Commonly known as:  DESYREL TAKE 1/2 TO 1 (ONE-HALF TO ONE) TABLET BY MOUTH AT BEDTIME AS NEEDED FOR SLEEP         Management plans discussed with the patient and he is in agreement. Stable for discharge home  Patient should follow up with oncology  CODE STATUS:     Code Status Orders  (From admission, onward)        Start     Ordered   07/08/17 1719  Do not attempt resuscitation (DNR)  Continuous    Question Answer Comment  In the event of cardiac or respiratory ARREST Do not call a "code blue"   In the event of cardiac or respiratory ARREST Do not perform Intubation, CPR, defibrillation or ACLS   In the event of cardiac or respiratory ARREST Use medication by any route, position, wound  care, and other measures to relive pain and suffering. May use oxygen, suction and manual treatment of airway obstruction as needed for comfort.      07/08/17 1720    Code  Status History    Date Active Date Inactive Code Status Order ID Comments User Context   10/26/2013 0603 10/26/2013 1818 Full Code [id number]  Waldemar Dickens, MD ED   10/26/2013 0603 10/26/2013 0603 Full Code [id number]  Waldemar Dickens, MD ED    Advance Directive Documentation     Most Recent Value  Type of Advance Directive  Out of facility DNR (pink MOST or yellow form)  Pre-existing out of facility DNR order (yellow form or pink MOST form)  Yellow form placed in chart (order not valid for inpatient use)  "MOST" Form in Place?  -      TOTAL TIME TAKING CARE OF THIS PATIENT: 38 minutes.    Note: This dictation was prepared with Dragon dictation along with smaller phrase technology. Any transcriptional errors that result from this process are unintentional.  Murline Weigel M.D on 07/15/2017 at 9:18 AM  Between 7am to 6pm - Pager - [phone] After 6pm go to [email or web address] - password [email or web address] [facility]  Office  [phone]  CC: Primary care physician; [name], MD

## 2017-07-15 NOTE — Evaluation (Signed)
Physical Therapy Evaluation Patient Details Name: Scott [name] MRN: [id number] DOB: 01/01/1941 Today's Date: 07/15/2017   History of Present Illness  Pt is a60 y.o.malewith a known history of diabetes, hypertension, CLL, hypertension presents to the emergency room complaining of 1 month of shortness of breath and feeling fatigued. He had worsening of the symptoms recently with chest pain on exertion. Feelt lightheaded and dizzy. He had lower extremity edema. In the emergency room he was found to have anemia with hemoglobin 5.5 and platelets of 8. He has not noticed any blood in stool.  Assessment inlcudes: acute on chronic anemia due to CLL and GI bleed, severe thrombocytopenia with history of ITP, CLL, DM, HTN, and B12 deficiency.     Clinical Impression  Pt presents with minor deficits in strength, gait, balance, and activity tolerance compared to his stated baseline level of function.  Pt was Ind with bed mobility and transfers with good speed and control.  During amb without an AD the pt did present with min instability including mild drifting left and right but he was able to self correct without assistance.  During static balance training per below pt presented with fair to good stability including SLS times of 8 sec on the LLE and 5 sec on the RLE.  Pt will benefit from HHPT services upon discharge to safely address the above deficits for decreased risk of further functional decline and eventual return to PLOF.      Follow Up Recommendations Home health PT    Equipment Recommendations  None recommended by PT    Recommendations for Other Services       Precautions / Restrictions Precautions Precautions: None Restrictions Weight Bearing Restrictions: No      Mobility  Bed Mobility Overal bed mobility: Independent                Transfers Overall transfer level: Independent               General transfer comment: Good eccentric and concentric  control  Ambulation/Gait Ambulation/Gait assistance: Supervision Ambulation Distance (Feet): 120 Feet Assistive device: None Gait Pattern/deviations: Decreased step length - right;Decreased step length - left;Drifts right/left;Step-through pattern Gait velocity: Decreased   General Gait Details: Pt with some minimal drifting left/right during amb but able to self-correct; SpO2 and HR WNL pre and post amb with no adverse symptoms reported  Stairs            Wheelchair Mobility    Modified Rankin (Stroke Patients Only)       Balance Overall balance assessment: Needs assistance   Sitting balance-Leahy Scale: Normal     Standing balance support: No upper extremity supported Standing balance-Leahy Scale: Good                               Pertinent Vitals/Pain Pain Assessment: No/denies pain    Home Living Family/patient expects to be discharged to:: Private residence Living Arrangements: Alone Available Help at Discharge: Family;Available PRN/intermittently Type of Home: House Home Access: Ramped entrance     Home Layout: One level Home Equipment: Walker - 2 wheels      Prior Function Level of Independence: Independent         Comments: Ind amb community distances without AD, Ind with ADLs, no fall history, active including yardwork and woodworking     Hand Dominance   Dominant Hand: Right    Extremity/Trunk Assessment   Upper Extremity  Assessment Upper Extremity Assessment: Overall WFL for tasks assessed    Lower Extremity Assessment Lower Extremity Assessment: Generalized weakness       Communication   Communication: No difficulties  Cognition Arousal/Alertness: Awake/alert Behavior During Therapy: WFL for tasks assessed/performed Overall Cognitive Status: Within Functional Limits for tasks assessed                                        General Comments      Exercises Other Exercises Other Exercises:  Static standing balance training with feet apart, together, and semi-tandem with combinations of eyes open/closed and head still/head turns.  SLS training LLE and RLE.    Assessment/Plan    PT Assessment Patient needs continued PT services  PT Problem List Decreased strength;Decreased activity tolerance;Decreased balance       PT Treatment Interventions Gait training;Balance training;Therapeutic exercise;Therapeutic activities;Patient/family education    PT Goals (Current goals can be found in the Care Plan section)  Acute Rehab PT Goals Patient Stated Goal: To get stronger and get back home PT Goal Formulation: With patient Time For Goal Achievement: 07/28/17 Potential to Achieve Goals: Good    Frequency Min 2X/week   Barriers to discharge        Co-evaluation               AM-PAC PT "6 Clicks" Daily Activity  Outcome Measure Difficulty turning over in bed (including adjusting bedclothes, sheets and blankets)?: None Difficulty moving from lying on back to sitting on the side of the bed? : None Difficulty sitting down on and standing up from a chair with arms (e.g., wheelchair, bedside commode, etc,.)?: None Help needed moving to and from a bed to chair (including a wheelchair)?: None Help needed walking in hospital room?: None Help needed climbing 3-5 steps with a railing? : A Little 6 Click Score: 23    End of Session Equipment Utilized During Treatment: Gait belt Activity Tolerance: Patient tolerated treatment well Patient left: in bed;with call bell/phone within reach;with family/visitor present Nurse Communication: Mobility status PT Visit Diagnosis: Unsteadiness on feet (R26.81);Muscle weakness (generalized) (M62.81)    Time: 6301-6010 PT Time Calculation (min) (ACUTE ONLY): 30 min   Charges:   PT Evaluation $PT Eval Low Complexity: 1 Low PT Treatments $Therapeutic Exercise: 8-22 mins   PT G Codes:        DRoyetta Asal PT, DPT 07/15/17, 11:16  AM

## 2017-07-15 NOTE — Care Management Note (Signed)
Case Management Note  Patient Details  Name: Scott [name] MRN: [id number] Date of Birth: 1940/09/16  Subjective/Objective: Spoke with patient regarding discharge planning. Patient refusing any home health PT at this time. He feels he is ambulating well enough. He has family that will be staying with him and assisting him/ He has a walker if needed. Instructed him on how to access home health in the vent he feels like he needs it in the future.                    Action/Plan:   Expected Discharge Date:  07/15/17               Expected Discharge Plan:     In-House Referral:     Discharge planning Services  CM Consult  Post Acute Care Choice:    Choice offered to:  Patient  DME Arranged:    DME Agency:     HH Arranged:  Patient Refused [place] Agency:     Status of Service:     If discussed at [employer], dates discussed:    Additional Comments:  Jolly Mango, RN 07/15/2017, 11:23 AM

## 2017-07-17 ENCOUNTER — Other Ambulatory Visit: Payer: Self-pay

## 2017-07-17 ENCOUNTER — Inpatient Hospital Stay (HOSPITAL_BASED_OUTPATIENT_CLINIC_OR_DEPARTMENT_OTHER): Payer: Medicare Other | Admitting: Oncology

## 2017-07-17 ENCOUNTER — Inpatient Hospital Stay: Payer: Medicare Other | Attending: Oncology

## 2017-07-17 ENCOUNTER — Ambulatory Visit: Payer: Medicare Other | Admitting: Family Medicine

## 2017-07-17 ENCOUNTER — Encounter: Payer: Self-pay | Admitting: Oncology

## 2017-07-17 VITALS — BP 151/82 | HR 53 | Temp 96.2°F | Resp 18 | Wt 163.0 lb

## 2017-07-17 DIAGNOSIS — D696 Thrombocytopenia, unspecified: Secondary | ICD-10-CM | POA: Diagnosis not present

## 2017-07-17 DIAGNOSIS — I2510 Atherosclerotic heart disease of native coronary artery without angina pectoris: Secondary | ICD-10-CM | POA: Diagnosis not present

## 2017-07-17 DIAGNOSIS — C9110 Chronic lymphocytic leukemia of B-cell type not having achieved remission: Secondary | ICD-10-CM | POA: Insufficient documentation

## 2017-07-17 DIAGNOSIS — E039 Hypothyroidism, unspecified: Secondary | ICD-10-CM | POA: Diagnosis not present

## 2017-07-17 DIAGNOSIS — N183 Chronic kidney disease, stage 3 (moderate): Secondary | ICD-10-CM | POA: Diagnosis not present

## 2017-07-17 DIAGNOSIS — D649 Anemia, unspecified: Secondary | ICD-10-CM | POA: Diagnosis not present

## 2017-07-17 DIAGNOSIS — Z87891 Personal history of nicotine dependence: Secondary | ICD-10-CM

## 2017-07-17 DIAGNOSIS — E1140 Type 2 diabetes mellitus with diabetic neuropathy, unspecified: Secondary | ICD-10-CM | POA: Insufficient documentation

## 2017-07-17 LAB — LACTATE DEHYDROGENASE: LDH: 180 U/L (ref 98–192)

## 2017-07-17 LAB — CBC WITH DIFFERENTIAL/PLATELET
BASOS ABS: 0 10*3/uL (ref 0–0.1)
BASOS PCT: 0 %
EOS ABS: 0 10*3/uL (ref 0–0.7)
EOS PCT: 0 %
HCT: 31 % — ABNORMAL LOW (ref 40.0–52.0)
Hemoglobin: 10 g/dL — ABNORMAL LOW (ref 13.0–18.0)
Lymphocytes Relative: 91 %
Lymphs Abs: 40.4 10*3/uL — ABNORMAL HIGH (ref 1.0–3.6)
MCH: 30.2 pg (ref 26.0–34.0)
MCHC: 32.4 g/dL (ref 32.0–36.0)
MCV: 93.2 fL (ref 80.0–100.0)
MONO ABS: 0.9 10*3/uL (ref 0.2–1.0)
Monocytes Relative: 2 %
Neutro Abs: 3.2 10*3/uL (ref 1.4–6.5)
Neutrophils Relative %: 7 %
PLATELETS: 45 10*3/uL — AB (ref 150–440)
RBC: 3.32 MIL/uL — ABNORMAL LOW (ref 4.40–5.90)
RDW: 22.6 % — AB (ref 11.5–14.5)
WBC: 44.6 10*3/uL — ABNORMAL HIGH (ref 3.8–10.6)

## 2017-07-17 LAB — IRON AND TIBC
IRON: 189 ug/dL — AB (ref 45–182)
Saturation Ratios: 65 % — ABNORMAL HIGH (ref 17.9–39.5)
TIBC: 292 ug/dL (ref 250–450)
UIBC: 103 ug/dL

## 2017-07-17 LAB — FOLATE: Folate: 13.3 ng/mL (ref >5.9)

## 2017-07-17 LAB — FERRITIN: FERRITIN: 247 ng/mL (ref 24–336)

## 2017-07-17 LAB — VITAMIN B12: Vitamin B-12: 1504 pg/mL — ABNORMAL HIGH (ref 180–914)

## 2017-07-17 NOTE — Progress Notes (Signed)
Patient here today for hospital follow up.  

## 2017-07-19 NOTE — Progress Notes (Signed)
[name]  Telephone:(336) [phone] Fax:(336) [phone]  ID: [name] OB: 1940-11-04  MR#: [id number]  ZJQ#:[id number]  Patient Care Team: [name], MD as PCP - General (Family Medicine) Lloyd Huger, MD as Consulting Physician (Oncology) Clent Jacks, MD as Consulting Physician (Ophthalmology) Fleet Contras, MD as Consulting Physician (Nephrology) Dorothy Spark, MD as Consulting Physician (Cardiology)  CHIEF COMPLAINT: CLL, severe anemia and ITP.  INTERVAL HISTORY: Patient returns to clinic today for repeat laboratory work and hospital follow-up.  He was recently admitted to the hospital with significant anemia and thrombocytopenia.  He received high-dose prednisone and IVIG while in the hospital with only moderate improvement of his platelet count.  His hemoglobin has improved with blood transfusions.  Patient noted that heme positive stools and will require colonoscopy in the near future.  He has no neurologic complaints.  He has a good appetite and denies weight loss. He denies any night sweats. He has noted no lymphadenopathy. He has no chest pain or shortness of breath. He denies any nausea, vomiting, constipation, or diarrhea. He has no urinary complaints.  Patient offers no further specific complaints today.  REVIEW OF SYSTEMS:   Review of Systems  Constitutional: Negative.  Negative for fever, malaise/fatigue and weight loss.  Respiratory: Negative.  Negative for cough and shortness of breath.   Cardiovascular: Negative.  Negative for chest pain and leg swelling.  Gastrointestinal: Positive for blood in stool. Negative for abdominal pain.  Genitourinary: Negative.  Negative for dysuria.  Musculoskeletal: Negative.  Negative for back pain.  Skin: Negative.  Negative for rash.  Neurological: Negative.  Negative for sensory change, focal weakness and weakness.  Psychiatric/Behavioral: Negative.  The patient is not nervous/anxious.       As per HPI. Otherwise, a complete review of systems is negative.  PAST MEDICAL HISTORY: Past Medical History:  Diagnosis Date  . Acquired hand deformity 1962   hand saw accident at work  . Anemia 10/11/2011  . Arthritis   . Bradycardia 10/10/2011  . CAD (coronary artery disease) 10/10/2011   MI s/p PTCA (Dx-OM2 proximal concentric stenosis)  . CKD (chronic kidney disease) stage 3, GFR 30-59 ml/min (HCC)    [name]  . CLL (chronic lymphocytic leukemia) ([place])   . Colon polyps   . Generalized headaches    frequent  . Glaucoma    s/p laser surgery  . HLD (hyperlipidemia)   . Hypertension   . Hypothyroidism 10/10/2011  . Thrombocytopenia ([place]) 10/11/2011  . Type 2 diabetes with nephropathy [facility]    DM refresher course [facility] (04/2013)    PAST SURGICAL HISTORY: Past Surgical History:  Procedure Laterality Date  . BACK SURGERY     cervical neck  . CATARACT EXTRACTION, BILATERAL Bilateral 2017  . COLONOSCOPY  11/2008   1 polyp, diverticulosis, rec rpt 5 yrs (Dr. Oletta Lamas, [name])  . COLONOSCOPY  06/2014   hyperplastic polyp, rpt 5 yrs ([facility])  . EYE SURGERY  2012   laser surgery for glaucoma  . PTCA  1994, 1995  . US ECHOCARDIOGRAPHY  10/2013   inferior wall hypokinesis, mild LVH, EF 50-55%, mild MR and LA dilation    FAMILY HISTORY: Family History  Problem Relation Age of Onset  . Diabetes Sister   . Stomach cancer Sister 64  . Pneumonia Father        caused death  . Other Brother        no communication with brother so unsure of any health conditions  .  Coronary artery disease Son 41       5v CABG and stents  . Hyperlipidemia Sister   . Stroke Neg Hx   . Heart attack Neg Hx     ADVANCED DIRECTIVES (Y/N):  N  HEALTH MAINTENANCE: Social History   Tobacco Use  . Smoking status: Former Smoker    Last attempt to quit: 03/04/1978    Years since quitting: 39.4  . Smokeless tobacco: Never Used  Substance Use Topics  . Alcohol use: No  . Drug use: No      Colonoscopy:  PAP:  Bone density:  Lipid panel:  No Known Allergies  Current Outpatient Medications  Medication Sig Dispense Refill  . acetaminophen (TYLENOL) 650 MG CR tablet Take 650 mg by mouth every 8 (eight) hours as needed for pain.    Marland Kitchen atorvastatin (LIPITOR) 80 MG tablet TAKE 1 TABLET BY MOUTH ONCE DAILY 90 tablet 3  . carvedilol (COREG) 6.25 MG tablet TAKE 1 TABLET BY MOUTH TWICE DAILY 180 tablet 3  . diclofenac sodium (VOLTAREN) 1 % GEL Apply topically 4 (four) times daily.    . ferrous sulfate 325 (65 FE) MG tablet Take 325 mg by mouth daily with breakfast.    . furosemide (LASIX) 20 MG tablet Take 1 tablet (20 mg total) by mouth daily. 30 tablet 3  . glucose blood (ONE TOUCH ULTRA TEST) test strip USE ONE STRIP TO CHECK GLUCOSE TWICE DAILY AND  AS  NEEDED. Dx code:  E11.21 150 each 3  . insulin aspart (NOVOLOG) 100 UNIT/ML FlexPen Inject 3-7 Units into the skin 3 (three) times daily with meals.     . Insulin Detemir (LEVEMIR FLEXTOUCH) 100 UNIT/ML Pen Inject 25 Units into the skin daily at 10 pm. 15 pen 3  . levothyroxine (SYNTHROID, LEVOTHROID) 50 MCG tablet TAKE 1 TABLET BY MOUTH ONCE DAILY 90 tablet 2  . lisinopril (PRINIVIL,ZESTRIL) 10 MG tablet Take 1 tablet (10 mg total) by mouth daily. 30 tablet 3  . niacin (SLO-NIACIN) 500 MG tablet Take 1,000 mg by mouth at bedtime.     . nitroGLYCERIN (NITROSTAT) 0.4 MG SL tablet Place 1 tablet (0.4 mg total) under the tongue every 5 (five) minutes as needed for chest pain ((MAX of 3 doses)). 25 tablet 0  . pantoprazole (PROTONIX) 40 MG tablet Take 1 tablet (40 mg total) by mouth daily. 30 tablet 0  . traZODone (DESYREL) 50 MG tablet TAKE 1/2 TO 1 (ONE-HALF TO ONE) TABLET BY MOUTH AT BEDTIME AS NEEDED FOR SLEEP 30 tablet 2  . vitamin B-12 1000 MCG tablet Take 1 tablet (1,000 mcg total) by mouth daily. 120 tablet 0  . aspirin EC 81 MG tablet Take 1 tablet (81 mg total) by mouth daily. (Patient not taking: Reported on 07/17/2017) 90  tablet 3   No current facility-administered medications for this visit.     OBJECTIVE: Vitals:   07/17/17 1000  BP: (!) 151/82  Pulse: (!) 53  Resp: 18  Temp: (!) 96.2 F (35.7 C)     Body mass index is 23.39 kg/m.    ECOG FS:0 - Asymptomatic  General: Well-developed, well-nourished, no acute distress. Eyes: Pink conjunctiva, anicteric sclera. HEENT: Normocephalic, moist mucous membranes, clear oropharnyx. Lungs: Clear to auscultation bilaterally. Heart: Regular rate and rhythm. No rubs, murmurs, or gallops. Abdomen: Soft, nontender, nondistended. No organomegaly noted, normoactive bowel sounds. Musculoskeletal: No edema, cyanosis, or clubbing. Neuro: Alert, answering all questions appropriately. Cranial nerves grossly intact. Skin: No rashes or petechiae  noted. Psych: Normal affect. Lymphatics: No cervical, calvicular, axillary or inguinal LAD.  LAB RESULTS:  Lab Results  Component Value Date   NA 140 07/13/2017   K 3.6 07/13/2017   CL 108 07/13/2017   CO2 27 07/13/2017   GLUCOSE 106 (H) 07/13/2017   BUN 41 (H) 07/13/2017   CREATININE 1.20 07/13/2017   CALCIUM 8.8 (L) 07/13/2017   PROT 5.4 (L) 07/12/2017   ALBUMIN 3.3 (L) 07/12/2017   AST 24 07/12/2017   ALT 19 07/12/2017   ALKPHOS 50 07/12/2017   BILITOT 0.8 07/12/2017   GFRNONAA 57 (L) 07/13/2017   GFRAA >60 07/13/2017    Lab Results  Component Value Date   WBC 44.6 (H) 07/17/2017   NEUTROABS 3.2 07/17/2017   HGB 10.0 (L) 07/17/2017   HCT 31.0 (L) 07/17/2017   MCV 93.2 07/17/2017   PLT 45 (L) 07/17/2017     STUDIES: Dg Chest 2 View  Result Date: 07/08/2017 CLINICAL DATA:  Chest pain EXAM: CHEST - 2 VIEW COMPARISON:  October 21, 2015 FINDINGS: The heart size and mediastinal contours are within normal limits. Both lungs are clear. The visualized skeletal structures are unremarkable. IMPRESSION: No active cardiopulmonary disease. Electronically Signed   By: Dorise Bullion III M.D   On: 07/08/2017 15:35     ASSESSMENT: CLL, Rai stage 0, anemia, ITP.  PLAN:    1. CLL: Confirmed by peripheral blood flow cytometry.  Patient's white blood cell count continues to slowly trend up and is now 44.6.  Given his decreased hemoglobin and now thrombocytopenia with ITP, patient may benefit from treatment with single agent Rituxan in the near future.  Return to clinic in 1 week for laboratory work only and then in 2 weeks for laboratory work and further evaluation. 2.  Thrombocytopenia: Mildly improved.  Likely ITP in the setting of CLL.  Patient received high-dose prednisone and IVIG while in the hospital.  Monitor closely and consider Rituxan as above. 3.  Anemia: Patient's hemoglobin is slowly improving and is now 10.0.  He had heme positive stools while in the hospital and will require luminal evaluation once his platelet count improves.  Approximately 30 minutes was spent in discussion of which greater than 50% was consultation.    Patient expressed understanding and was in agreement with this plan. He also understands that He can call clinic at any time with any questions, concerns, or complaints.    Lloyd Huger, MD   07/19/2017 7:35 AM

## 2017-07-24 ENCOUNTER — Inpatient Hospital Stay: Payer: Medicare Other

## 2017-07-24 DIAGNOSIS — D649 Anemia, unspecified: Secondary | ICD-10-CM | POA: Diagnosis not present

## 2017-07-24 DIAGNOSIS — E039 Hypothyroidism, unspecified: Secondary | ICD-10-CM | POA: Diagnosis not present

## 2017-07-24 DIAGNOSIS — E1140 Type 2 diabetes mellitus with diabetic neuropathy, unspecified: Secondary | ICD-10-CM | POA: Diagnosis not present

## 2017-07-24 DIAGNOSIS — N183 Chronic kidney disease, stage 3 (moderate): Secondary | ICD-10-CM | POA: Diagnosis not present

## 2017-07-24 DIAGNOSIS — D696 Thrombocytopenia, unspecified: Secondary | ICD-10-CM | POA: Diagnosis not present

## 2017-07-24 DIAGNOSIS — C9110 Chronic lymphocytic leukemia of B-cell type not having achieved remission: Secondary | ICD-10-CM | POA: Diagnosis not present

## 2017-07-24 LAB — CBC WITH DIFFERENTIAL/PLATELET
BASOS ABS: 0 10*3/uL (ref 0–0.1)
Basophils Relative: 0 %
EOS ABS: 0.1 10*3/uL (ref 0–0.7)
EOS PCT: 0 %
HCT: 30.3 % — ABNORMAL LOW (ref 40.0–52.0)
Hemoglobin: 9.7 g/dL — ABNORMAL LOW (ref 13.0–18.0)
LYMPHS ABS: 20.9 10*3/uL — AB (ref 1.0–3.6)
LYMPHS PCT: 82 %
MCH: 30.2 pg (ref 26.0–34.0)
MCHC: 32 g/dL (ref 32.0–36.0)
MCV: 94.3 fL (ref 80.0–100.0)
MONO ABS: 0.5 10*3/uL (ref 0.2–1.0)
Monocytes Relative: 2 %
Neutro Abs: 4.1 10*3/uL (ref 1.4–6.5)
Neutrophils Relative %: 16 %
PLATELETS: 70 10*3/uL — AB (ref 150–440)
RBC: 3.21 MIL/uL — ABNORMAL LOW (ref 4.40–5.90)
RDW: 22.1 % — AB (ref 11.5–14.5)
WBC: 25.5 10*3/uL — ABNORMAL HIGH (ref 3.8–10.6)

## 2017-07-29 ENCOUNTER — Other Ambulatory Visit: Payer: Self-pay

## 2017-07-29 MED ORDER — INSULIN ASPART 100 UNIT/ML FLEXPEN: 5.0000 [IU] | PEN_INJECTOR | Freq: Three times a day (TID) | SUBCUTANEOUS | 1 refills | Status: DC

## 2017-07-29 NOTE — Telephone Encounter (Addendum)
Pt came in and said he needed his lantus insulin refilled. I could not fine lantus on med list; I called pt but pt had left waiting room; I called pt at home; pt looked on insulin and said he was needing levemir insulin and not lantus; I spoke with Theadora Rama at [address] and pt picked up levemir on 07/22/17. I asked pt and he checked again and it was novolog that pt needs. Pt said was in hospital first of may and novolog was changed and now pt is supposed to be taking novolog 3-7 units tid with meals. I asked pt how he decided how many units to take; pt said he usually took 5 units unless it was very high or depending on how pt felt. I understood pt to say he did not ck BS before every meal. Pt has about 1 1/2 ml of novolog left per pt.  I spoke with Dr Darnell Level and Dr Darnell Level said to send him a note and let him review. Pt has f/u appt with Dr Darnell Level on 08/22/17. Pt voiced understanding and will wait for cb today or tomorrow about getting refill. I did advise pt to ck BS before each meal. Pt stated"he always does." pt said he has no one to help him with his meds. [facility] [address]

## 2017-07-29 NOTE — Telephone Encounter (Signed)
plz notify sent in. 

## 2017-07-30 NOTE — Telephone Encounter (Signed)
Spoke with pt notifying him rx was sent to [facility].    Pt states he has had edema in bilateral feet for about 1 week.  Says he keeping them elevated and has doubled his furosemide (2 tabs daily).  Denies any pain or redness.

## 2017-07-31 ENCOUNTER — Ambulatory Visit: Payer: Medicare Other | Admitting: Oncology

## 2017-07-31 ENCOUNTER — Other Ambulatory Visit: Payer: Medicare Other

## 2017-07-31 NOTE — Telephone Encounter (Signed)
May double up for 3 days and if not better, schedule office visit for eval.

## 2017-07-31 NOTE — Telephone Encounter (Signed)
Left message on vm per dpr relaying instructions per Dr. Darnell Level.

## 2017-08-01 NOTE — Telephone Encounter (Signed)
Left message on vm per dpr relaying instructions per Dr. Darnell Level.

## 2017-08-05 NOTE — Telephone Encounter (Signed)
Spoke with pt asking about edema.  Says he got my messages and did what Dr. Darnell Level instructed and reports his feet are back to normal.  Expresses his thanks for the call.

## 2017-08-09 NOTE — Progress Notes (Signed)
[place]  Telephone:(336) [phone] Fax:(336) [phone]  ID: [name] OB: 07-Apr-1940  MR#: [id number]  GYJ#:[id number]  Patient Care Team: [name], MD as PCP - General (Family Medicine) Lloyd Huger, MD as Consulting Physician (Oncology) Clent Jacks, MD as Consulting Physician (Ophthalmology) Fleet Contras, MD as Consulting Physician (Nephrology) Dorothy Spark, MD as Consulting Physician (Cardiology)  CHIEF COMPLAINT: CLL, severe anemia and ITP.  INTERVAL HISTORY: Patient returns to clinic today for repeat laboratory work, further evaluation, and hospital follow-up.  He currently feels well and is asymptomatic.  He has no neurologic complaints.  He has a good appetite and denies weight loss. He denies any night sweats. He has noted no lymphadenopathy. He has no chest pain or shortness of breath. He denies any nausea, vomiting, constipation, or diarrhea. He has no urinary complaints.  Patient feels at his baseline offers no specific complaints today.  REVIEW OF SYSTEMS:   Review of Systems  Constitutional: Negative.  Negative for fever, malaise/fatigue and weight loss.  Respiratory: Negative.  Negative for cough and shortness of breath.   Cardiovascular: Negative.  Negative for chest pain and leg swelling.  Gastrointestinal: Negative for abdominal pain and blood in stool.  Genitourinary: Negative.  Negative for dysuria and hematuria.  Musculoskeletal: Negative.  Negative for back pain.  Skin: Negative.  Negative for rash.  Neurological: Negative.  Negative for sensory change, focal weakness and weakness.  Psychiatric/Behavioral: Negative.  The patient is not nervous/anxious.     As per HPI. Otherwise, a complete review of systems is negative.  PAST MEDICAL HISTORY: Past Medical History:  Diagnosis Date  . Acquired hand deformity 1962   hand saw accident at work  . Anemia 10/11/2011  . Arthritis   . Bradycardia 10/10/2011  . CAD  (coronary artery disease) 10/10/2011   MI s/p PTCA (Dx-OM2 proximal concentric stenosis)  . CKD (chronic kidney disease) stage 3, GFR 30-59 ml/min (HCC)    [name]  . CLL (chronic lymphocytic leukemia) ([place])   . Colon polyps   . Generalized headaches    frequent  . Glaucoma    s/p laser surgery  . HLD (hyperlipidemia)   . Hypertension   . Hypothyroidism 10/10/2011  . Thrombocytopenia ([place]) 10/11/2011  . Type 2 diabetes with nephropathy [facility])    DM refresher course [facility] (04/2013)    PAST SURGICAL HISTORY: Past Surgical History:  Procedure Laterality Date  . BACK SURGERY     cervical neck  . CATARACT EXTRACTION, BILATERAL Bilateral 2017  . COLONOSCOPY  11/2008   1 polyp, diverticulosis, rec rpt 5 yrs (Dr. Oletta Lamas, [name])  . COLONOSCOPY  06/2014   hyperplastic polyp, rpt 5 yrs ([facility])  . EYE SURGERY  2012   laser surgery for glaucoma  . PTCA  1994, 1995  . US ECHOCARDIOGRAPHY  10/2013   inferior wall hypokinesis, mild LVH, EF 50-55%, mild MR and LA dilation    FAMILY HISTORY: Family History  Problem Relation Age of Onset  . Diabetes Sister   . Stomach cancer Sister 52  . Pneumonia Father        caused death  . Other Brother        no communication with brother so unsure of any health conditions  . Coronary artery disease Son 49       5v CABG and stents  . Hyperlipidemia Sister   . Stroke Neg Hx   . Heart attack Neg Hx     ADVANCED DIRECTIVES (Y/N):  N  HEALTH MAINTENANCE: Social History   Tobacco Use  . Smoking status: Former Smoker    Last attempt to quit: 03/04/1978    Years since quitting: 39.4  . Smokeless tobacco: Never Used  Substance Use Topics  . Alcohol use: No  . Drug use: No     Colonoscopy:  PAP:  Bone density:  Lipid panel:  No Known Allergies  Current Outpatient Medications  Medication Sig Dispense Refill  . acetaminophen (TYLENOL) 650 MG CR tablet Take 650 mg by mouth every 8 (eight) hours as needed for pain.    Marland Kitchen atorvastatin (LIPITOR)  80 MG tablet TAKE 1 TABLET BY MOUTH ONCE DAILY 90 tablet 3  . carvedilol (COREG) 6.25 MG tablet TAKE 1 TABLET BY MOUTH TWICE DAILY 180 tablet 3  . diclofenac sodium (VOLTAREN) 1 % GEL Apply topically 4 (four) times daily.    . ferrous sulfate 325 (65 FE) MG tablet Take 325 mg by mouth daily with breakfast.    . furosemide (LASIX) 20 MG tablet Take 1 tablet (20 mg total) by mouth daily. 30 tablet 3  . glucose blood (ONE TOUCH ULTRA TEST) test strip USE ONE STRIP TO CHECK GLUCOSE TWICE DAILY AND  AS  NEEDED. Dx code:  E11.21 150 each 3  . insulin aspart (NOVOLOG) 100 UNIT/ML FlexPen Inject 5 Units into the skin 3 (three) times daily with meals. (3-7 units) 15 mL 1  . Insulin Detemir (LEVEMIR FLEXTOUCH) 100 UNIT/ML Pen Inject 25 Units into the skin daily at 10 pm. 15 pen 3  . levothyroxine (SYNTHROID, LEVOTHROID) 50 MCG tablet TAKE 1 TABLET BY MOUTH ONCE DAILY 90 tablet 2  . lisinopril (PRINIVIL,ZESTRIL) 10 MG tablet Take 1 tablet (10 mg total) by mouth daily. 30 tablet 3  . niacin (SLO-NIACIN) 500 MG tablet Take 1,000 mg by mouth at bedtime.     . nitroGLYCERIN (NITROSTAT) 0.4 MG SL tablet Place 1 tablet (0.4 mg total) under the tongue every 5 (five) minutes as needed for chest pain ((MAX of 3 doses)). 25 tablet 0  . pantoprazole (PROTONIX) 40 MG tablet Take 1 tablet (40 mg total) by mouth daily. 30 tablet 0  . traZODone (DESYREL) 50 MG tablet TAKE 1/2 TO 1 (ONE-HALF TO ONE) TABLET BY MOUTH AT BEDTIME AS NEEDED FOR SLEEP 30 tablet 2  . vitamin B-12 1000 MCG tablet Take 1 tablet (1,000 mcg total) by mouth daily. 120 tablet 0  . aspirin EC 81 MG tablet Take 1 tablet (81 mg total) by mouth daily. (Patient not taking: Reported on 07/17/2017) 90 tablet 3   No current facility-administered medications for this visit.     OBJECTIVE: Vitals:   08/11/17 1426  BP: (!) 168/78  Pulse: (!) 54  Resp: 20  Temp: 97.8 F (36.6 C)     Body mass index is 23.2 kg/m.    ECOG FS:0 - Asymptomatic  General:  Well-developed, well-nourished, no acute distress. Eyes: Pink conjunctiva, anicteric sclera. Lungs: Clear to auscultation bilaterally. Heart: Regular rate and rhythm. No rubs, murmurs, or gallops. Abdomen: Soft, nontender, nondistended. No organomegaly noted, normoactive bowel sounds. Musculoskeletal: No edema, cyanosis, or clubbing.  Several fingers missing on right hand. Neuro: Alert, answering all questions appropriately. Cranial nerves grossly intact. Skin: No rashes or petechiae noted. Psych: Normal affect. Lymphatics: No cervical, calvicular, axillary or inguinal LAD.  LAB RESULTS:  Lab Results  Component Value Date   NA 140 07/13/2017   K 3.6 07/13/2017   CL 108 07/13/2017   CO2 27  07/13/2017   GLUCOSE 106 (H) 07/13/2017   BUN 41 (H) 07/13/2017   CREATININE 1.20 07/13/2017   CALCIUM 8.8 (L) 07/13/2017   PROT 5.4 (L) 07/12/2017   ALBUMIN 3.3 (L) 07/12/2017   AST 24 07/12/2017   ALT 19 07/12/2017   ALKPHOS 50 07/12/2017   BILITOT 0.8 07/12/2017   GFRNONAA 57 (L) 07/13/2017   GFRAA >60 07/13/2017    Lab Results  Component Value Date   WBC 9.4 08/11/2017   NEUTROABS 1.2 (L) 08/11/2017   HGB 10.5 (L) 08/11/2017   HCT 31.2 (L) 08/11/2017   MCV 92.7 08/11/2017   PLT 13 (LL) 08/11/2017     STUDIES: No results found.  ASSESSMENT: CLL, Rai stage 0, anemia, ITP.  PLAN:    1. CLL: Confirmed by peripheral blood flow cytometry.  Patient's white blood cell count is now within normal limits.  Given his significant thrombocytopenia, patient will benefit from single agent Rituxan and return later this week for consideration of cycle 1.  Patient will then return to clinic 1 week later for further evaluation and consideration of cycle 2 of 4 of weekly Rituxan. 2.  Thrombocytopenia:  Platelets have significantly declined. Likely ITP in the setting of CLL.  Patient received high-dose prednisone and IVIG while in the hospital, but these treatments were not durable.  Return to  clinic later this week for cycle 1 of Rituxan as above.   3.  Anemia: Patient's hemoglobin is slowly improving and is now greater than 10.0.  He had heme positive stools while in the hospital and will require luminal evaluation once his platelet count improves.  Approximately 30 minutes was spent in discussion of which greater than 50% was consultation.  Patient expressed understanding and was in agreement with this plan. He also understands that He can call clinic at any time with any questions, concerns, or complaints.    Lloyd Huger, MD   08/14/2017 12:07 AM

## 2017-08-11 ENCOUNTER — Encounter: Payer: Self-pay | Admitting: Oncology

## 2017-08-11 ENCOUNTER — Inpatient Hospital Stay (HOSPITAL_BASED_OUTPATIENT_CLINIC_OR_DEPARTMENT_OTHER): Payer: Medicare Other | Admitting: Oncology

## 2017-08-11 ENCOUNTER — Inpatient Hospital Stay: Payer: Medicare Other | Attending: Oncology

## 2017-08-11 VITALS — BP 168/78 | HR 54 | Temp 97.8°F | Resp 20 | Wt 161.7 lb

## 2017-08-11 DIAGNOSIS — C9110 Chronic lymphocytic leukemia of B-cell type not having achieved remission: Secondary | ICD-10-CM

## 2017-08-11 DIAGNOSIS — I129 Hypertensive chronic kidney disease with stage 1 through stage 4 chronic kidney disease, or unspecified chronic kidney disease: Secondary | ICD-10-CM | POA: Insufficient documentation

## 2017-08-11 DIAGNOSIS — Z87891 Personal history of nicotine dependence: Secondary | ICD-10-CM | POA: Insufficient documentation

## 2017-08-11 DIAGNOSIS — E039 Hypothyroidism, unspecified: Secondary | ICD-10-CM | POA: Diagnosis not present

## 2017-08-11 DIAGNOSIS — D696 Thrombocytopenia, unspecified: Secondary | ICD-10-CM

## 2017-08-11 DIAGNOSIS — R0789 Other chest pain: Secondary | ICD-10-CM | POA: Insufficient documentation

## 2017-08-11 DIAGNOSIS — N183 Chronic kidney disease, stage 3 (moderate): Secondary | ICD-10-CM | POA: Diagnosis not present

## 2017-08-11 DIAGNOSIS — D693 Immune thrombocytopenic purpura: Secondary | ICD-10-CM | POA: Diagnosis not present

## 2017-08-11 DIAGNOSIS — I2510 Atherosclerotic heart disease of native coronary artery without angina pectoris: Secondary | ICD-10-CM | POA: Insufficient documentation

## 2017-08-11 DIAGNOSIS — D649 Anemia, unspecified: Secondary | ICD-10-CM | POA: Diagnosis not present

## 2017-08-11 DIAGNOSIS — Z5112 Encounter for antineoplastic immunotherapy: Secondary | ICD-10-CM | POA: Diagnosis not present

## 2017-08-11 LAB — CBC WITH DIFFERENTIAL/PLATELET
BASOS PCT: 0 %
Basophils Absolute: 0 10*3/uL (ref 0–0.1)
Eosinophils Absolute: 0.1 10*3/uL (ref 0–0.7)
Eosinophils Relative: 1 %
HEMATOCRIT: 31.2 % — AB (ref 40.0–52.0)
Hemoglobin: 10.5 g/dL — ABNORMAL LOW (ref 13.0–18.0)
Lymphocytes Relative: 81 %
Lymphs Abs: 7.6 10*3/uL — ABNORMAL HIGH (ref 1.0–3.6)
MCH: 31.1 pg (ref 26.0–34.0)
MCHC: 33.5 g/dL (ref 32.0–36.0)
MCV: 92.7 fL (ref 80.0–100.0)
Monocytes Absolute: 0.5 10*3/uL (ref 0.2–1.0)
Monocytes Relative: 6 %
NEUTROS ABS: 1.2 10*3/uL — AB (ref 1.4–6.5)
Neutrophils Relative %: 12 %
Platelets: 13 10*3/uL — CL (ref 150–440)
RBC: 3.37 MIL/uL — ABNORMAL LOW (ref 4.40–5.90)
RDW: 19.9 % — AB (ref 11.5–14.5)
WBC: 9.4 10*3/uL (ref 3.8–10.6)

## 2017-08-11 NOTE — Progress Notes (Signed)
Patient denies any concerns today.  

## 2017-08-12 ENCOUNTER — Other Ambulatory Visit: Payer: Self-pay | Admitting: Oncology

## 2017-08-12 DIAGNOSIS — D696 Thrombocytopenia, unspecified: Secondary | ICD-10-CM

## 2017-08-13 ENCOUNTER — Inpatient Hospital Stay: Payer: Medicare Other

## 2017-08-13 VITALS — BP 175/75 | HR 69 | Temp 95.2°F | Resp 20 | Wt 161.0 lb

## 2017-08-13 DIAGNOSIS — I2510 Atherosclerotic heart disease of native coronary artery without angina pectoris: Secondary | ICD-10-CM | POA: Diagnosis not present

## 2017-08-13 DIAGNOSIS — T7840XA Allergy, unspecified, initial encounter: Secondary | ICD-10-CM

## 2017-08-13 DIAGNOSIS — N183 Chronic kidney disease, stage 3 (moderate): Secondary | ICD-10-CM | POA: Diagnosis not present

## 2017-08-13 DIAGNOSIS — Z5112 Encounter for antineoplastic immunotherapy: Secondary | ICD-10-CM | POA: Diagnosis not present

## 2017-08-13 DIAGNOSIS — C9110 Chronic lymphocytic leukemia of B-cell type not having achieved remission: Secondary | ICD-10-CM | POA: Diagnosis not present

## 2017-08-13 DIAGNOSIS — D696 Thrombocytopenia, unspecified: Secondary | ICD-10-CM

## 2017-08-13 DIAGNOSIS — D649 Anemia, unspecified: Secondary | ICD-10-CM | POA: Diagnosis not present

## 2017-08-13 DIAGNOSIS — D693 Immune thrombocytopenic purpura: Secondary | ICD-10-CM | POA: Diagnosis not present

## 2017-08-13 MED ORDER — SODIUM CHLORIDE 0.9 % IV SOLN
Freq: Once | INTRAVENOUS | Status: AC
  Administered 2017-08-13: 09:00:00 via INTRAVENOUS
  Filled 2017-08-13: qty 1000

## 2017-08-13 MED ORDER — METHYLPREDNISOLONE SODIUM SUCC 125 MG IJ SOLR
125.0000 mg | Freq: Once | INTRAMUSCULAR | Status: AC | PRN
  Administered 2017-08-13: 125 mg via INTRAVENOUS

## 2017-08-13 MED ORDER — SODIUM CHLORIDE 0.9 % IV SOLN
375.0000 mg/m2 | Freq: Once | INTRAVENOUS | Status: AC
  Administered 2017-08-13: 700 mg via INTRAVENOUS
  Filled 2017-08-13: qty 70
  Filled 2017-08-13: qty 50

## 2017-08-13 MED ORDER — MEPERIDINE HCL 25 MG/ML IJ SOLN
25.0000 mg | Freq: Once | INTRAMUSCULAR | Status: AC
  Administered 2017-08-13: 25 mg via INTRAVENOUS

## 2017-08-13 MED ORDER — DIPHENHYDRAMINE HCL 25 MG PO CAPS
25.0000 mg | ORAL_CAPSULE | Freq: Once | ORAL | Status: AC
  Administered 2017-08-13: 25 mg via ORAL
  Filled 2017-08-13: qty 1

## 2017-08-13 MED ORDER — ACETAMINOPHEN 325 MG PO TABS
650.0000 mg | ORAL_TABLET | Freq: Once | ORAL | Status: AC
  Administered 2017-08-13: 650 mg via ORAL
  Filled 2017-08-13: qty 2

## 2017-08-13 MED ORDER — DIPHENHYDRAMINE HCL 50 MG/ML IJ SOLN
25.0000 mg | Freq: Once | INTRAMUSCULAR | Status: AC | PRN
  Administered 2017-08-13: 25 mg via INTRAVENOUS

## 2017-08-13 MED ORDER — SODIUM CHLORIDE 0.9 % IV SOLN
Freq: Once | INTRAVENOUS | Status: AC | PRN
  Administered 2017-08-13: 11:00:00 via INTRAVENOUS
  Filled 2017-08-13: qty 1000

## 2017-08-13 NOTE — Progress Notes (Signed)
Pt here Pt here receiving his first Rituxan this morning, after 4th bump pt began to complain of "shakey, anxious feeling", infusion stopped, VS taken, see flow sheets, meds given per protocol see MAR, NP Sonia Baller B. At chairside and Dr [name] made aware of situation. Pts son at chairside and explained what was going on and all questions answered at this time.Pt began to feel "normal" and Rituxan infusion restarted at 2nd bump per verbal from NP and pt is tolerating well at this time. Will continue to monitor and assess throughout completion of infusion.

## 2017-08-17 NOTE — Progress Notes (Signed)
[facility]  Telephone:([phone]) [phone] Fax:(336) [phone]  ID: Scott [name] OB: April 24, 1940  MR#: [id number]  NKN#:[id number]  Patient Care Team: [name], MD as PCP - General (Family Medicine) Lloyd Huger, MD as Consulting Physician (Oncology) Clent Jacks, MD as Consulting Physician (Ophthalmology) Fleet Contras, MD as Consulting Physician (Nephrology) Dorothy Spark, MD as Consulting Physician (Cardiology)  CHIEF COMPLAINT: CLL, severe anemia and ITP.  INTERVAL HISTORY: Patient returns to clinic today for further evaluation and consideration of cycle 2 of 4 of weekly Rituxan.  He had a reaction to his infusion last week, but once it resolved and his rate was turned down he completed his infusion.  He noted his blood sugars being extremely high for several days, but he did receive IV steroids for his reaction.  Currently he feels well and is asymptomatic.  He has no neurologic complaints.  He has a good appetite and denies weight loss. He denies any night sweats. He has noted no lymphadenopathy. He has no chest pain or shortness of breath. He denies any nausea, vomiting, constipation, or diarrhea. He has no urinary complaints.  Patient offers no specific complaints today.  REVIEW OF SYSTEMS:   Review of Systems  Constitutional: Negative.  Negative for fever, malaise/fatigue and weight loss.  Respiratory: Negative.  Negative for cough and shortness of breath.   Cardiovascular: Negative.  Negative for chest pain and leg swelling.  Gastrointestinal: Negative for abdominal pain and blood in stool.  Genitourinary: Negative.  Negative for dysuria and hematuria.  Musculoskeletal: Negative.  Negative for back pain.  Skin: Negative.  Negative for rash.  Neurological: Negative.  Negative for sensory change, focal weakness and weakness.  Psychiatric/Behavioral: Negative.  The patient is not nervous/anxious.     As per HPI. Otherwise, a complete review  of systems is negative.  PAST MEDICAL HISTORY: Past Medical History:  Diagnosis Date  . Acquired hand deformity 1962   hand saw accident at work  . Anemia 10/11/2011  . Arthritis   . Bradycardia 10/10/2011  . CAD (coronary artery disease) 10/10/2011   MI s/p PTCA (Dx-OM2 proximal concentric stenosis)  . CKD (chronic kidney disease) stage 3, GFR 30-59 ml/min (HCC)    [name]  . CLL (chronic lymphocytic leukemia) ([name])   . Colon polyps   . Generalized headaches    frequent  . Glaucoma    s/p laser surgery  . HLD (hyperlipidemia)   . Hypertension   . Hypothyroidism 10/10/2011  . Thrombocytopenia ([place]) 10/11/2011  . Type 2 diabetes with nephropathy [facility])    DM refresher course [facility] (04/2013)    PAST SURGICAL HISTORY: Past Surgical History:  Procedure Laterality Date  . BACK SURGERY     cervical neck  . CATARACT EXTRACTION, BILATERAL Bilateral 2017  . COLONOSCOPY  11/2008   1 polyp, diverticulosis, rec rpt 5 yrs (Dr. Oletta Lamas, [name])  . COLONOSCOPY  06/2014   hyperplastic polyp, rpt 5 yrs ([facility])  . EYE SURGERY  2012   laser surgery for glaucoma  . PTCA  1994, 1995  . US ECHOCARDIOGRAPHY  10/2013   inferior wall hypokinesis, mild LVH, EF 50-55%, mild MR and LA dilation    FAMILY HISTORY: Family History  Problem Relation Age of Onset  . Diabetes Sister   . Stomach cancer Sister 56  . Pneumonia Father        caused death  . Other Brother        no communication with brother so unsure of any  health conditions  . Coronary artery disease Son 3       5v CABG and stents  . Hyperlipidemia Sister   . Stroke Neg Hx   . Heart attack Neg Hx     ADVANCED DIRECTIVES (Y/N):  N  HEALTH MAINTENANCE: Social History   Tobacco Use  . Smoking status: Former Smoker    Last attempt to quit: 03/04/1978    Years since quitting: 39.4  . Smokeless tobacco: Never Used  Substance Use Topics  . Alcohol use: No  . Drug use: No     Colonoscopy:  PAP:  Bone density:  Lipid panel:  No  Known Allergies  Current Outpatient Medications  Medication Sig Dispense Refill  . acetaminophen (TYLENOL) 650 MG CR tablet Take 650 mg by mouth every 8 (eight) hours as needed for pain.    Marland Kitchen atorvastatin (LIPITOR) 80 MG tablet TAKE 1 TABLET BY MOUTH ONCE DAILY 90 tablet 3  . carvedilol (COREG) 6.25 MG tablet TAKE 1 TABLET BY MOUTH TWICE DAILY 180 tablet 3  . diclofenac sodium (VOLTAREN) 1 % GEL Apply topically 4 (four) times daily.    . ferrous sulfate 325 (65 FE) MG tablet Take 325 mg by mouth daily with breakfast.    . furosemide (LASIX) 20 MG tablet Take 1 tablet (20 mg total) by mouth daily. 30 tablet 3  . glucose blood (ONE TOUCH ULTRA TEST) test strip USE ONE STRIP TO CHECK GLUCOSE TWICE DAILY AND  AS  NEEDED. Dx code:  E11.21 150 each 3  . insulin aspart (NOVOLOG) 100 UNIT/ML FlexPen Inject 5 Units into the skin 3 (three) times daily with meals. (3-7 units) 15 mL 1  . Insulin Detemir (LEVEMIR FLEXTOUCH) 100 UNIT/ML Pen Inject 25 Units into the skin daily at 10 pm. 15 pen 3  . levothyroxine (SYNTHROID, LEVOTHROID) 50 MCG tablet TAKE 1 TABLET BY MOUTH ONCE DAILY 90 tablet 2  . lisinopril (PRINIVIL,ZESTRIL) 10 MG tablet Take 1 tablet (10 mg total) by mouth daily. 30 tablet 3  . niacin (SLO-NIACIN) 500 MG tablet Take 1,000 mg by mouth at bedtime.     . nitroGLYCERIN (NITROSTAT) 0.4 MG SL tablet Place 1 tablet (0.4 mg total) under the tongue every 5 (five) minutes as needed for chest pain ((MAX of 3 doses)). 25 tablet 0  . pantoprazole (PROTONIX) 40 MG tablet Take 1 tablet (40 mg total) by mouth daily. 30 tablet 0  . traZODone (DESYREL) 50 MG tablet TAKE 1/2 TO 1 (ONE-HALF TO ONE) TABLET BY MOUTH AT BEDTIME AS NEEDED FOR SLEEP 30 tablet 2  . vitamin B-12 1000 MCG tablet Take 1 tablet (1,000 mcg total) by mouth daily. 120 tablet 0  . aspirin EC 81 MG tablet Take 1 tablet (81 mg total) by mouth daily. (Patient not taking: Reported on 07/17/2017) 90 tablet 3   No current facility-administered  medications for this visit.    Facility-Administered Medications Ordered in Other Visits  Medication Dose Route Frequency Provider Last Rate Last Dose  . meperidine (DEMEROL) injection 25 mg  25 mg Intravenous Once PRN Lloyd Huger, MD        OBJECTIVE: Vitals:   08/20/17 0833  BP: (!) 144/76  Pulse: (!) 54  Resp: 20  Temp: (!) 96.4 F (35.8 C)     Body mass index is 23.03 kg/m.    ECOG FS:0 - Asymptomatic  General: Well-developed, well-nourished, no acute distress. Eyes: Pink conjunctiva, anicteric sclera. Lungs: Clear to auscultation bilaterally. Heart: Regular rate  and rhythm. No rubs, murmurs, or gallops. Abdomen: Soft, nontender, nondistended. No organomegaly noted, normoactive bowel sounds. Musculoskeletal: No edema, cyanosis, or clubbing. Neuro: Alert, answering all questions appropriately. Cranial nerves grossly intact. Skin: No rashes or petechiae noted. Psych: Normal affect. Lymphatics: No cervical, calvicular, axillary or inguinal LAD.  LAB RESULTS:  Lab Results  Component Value Date   NA 140 07/13/2017   K 3.6 07/13/2017   CL 108 07/13/2017   CO2 27 07/13/2017   GLUCOSE 106 (H) 07/13/2017   BUN 41 (H) 07/13/2017   CREATININE 1.20 07/13/2017   CALCIUM 8.8 (L) 07/13/2017   PROT 5.4 (L) 07/12/2017   ALBUMIN 3.3 (L) 07/12/2017   AST 24 07/12/2017   ALT 19 07/12/2017   ALKPHOS 50 07/12/2017   BILITOT 0.8 07/12/2017   GFRNONAA 57 (L) 07/13/2017   GFRAA >60 07/13/2017    Lab Results  Component Value Date   WBC 6.0 08/20/2017   NEUTROABS 2.0 08/20/2017   HGB 10.4 (L) 08/20/2017   HCT 30.2 (L) 08/20/2017   MCV 93.6 08/20/2017   PLT 55 (L) 08/20/2017     STUDIES: No results found.  ASSESSMENT: CLL, Rai stage 0, anemia, ITP.  PLAN:    1. CLL: Confirmed by peripheral blood flow cytometry.  Patient's white blood cell count continues to be within normal limits. Given his significant thrombocytopenia, patient will benefit from single agent  Rituxan.  Proceed with cycle 2 of 4 of weekly Rituxan.  Return to clinic in 1 week for further evaluation and consideration of cycle 3.   2.  Thrombocytopenia: Platelets have improved to 55 with just one infusion of Rituxan.  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  Patient received high-dose prednisone and IVIG while in the hospital, but these treatments were not durable.  Return to clinic as above. 3.  Anemia: Patient's hemoglobin is decreased, but stable. He had heme positive stools while in the hospital and will require luminal evaluation once his platelet count improves. 4.  Reaction to Rituxan: Rate-based.  Proceed with treatment as above. 5.  Hyperglycemia: Resolved.  Likely secondary to IV steroids that he received for his reaction last week.   Patient expressed understanding and was in agreement with this plan. He also understands that He can call clinic at any time with any questions, concerns, or complaints.    Lloyd Huger, MD   08/20/2017 11:23 AM

## 2017-08-20 ENCOUNTER — Encounter: Payer: Self-pay | Admitting: Gastroenterology

## 2017-08-20 ENCOUNTER — Ambulatory Visit (INDEPENDENT_AMBULATORY_CARE_PROVIDER_SITE_OTHER): Payer: Medicare Other | Admitting: Gastroenterology

## 2017-08-20 ENCOUNTER — Inpatient Hospital Stay: Payer: Medicare Other

## 2017-08-20 ENCOUNTER — Other Ambulatory Visit: Payer: Self-pay

## 2017-08-20 ENCOUNTER — Encounter: Payer: Self-pay | Admitting: Oncology

## 2017-08-20 ENCOUNTER — Inpatient Hospital Stay (HOSPITAL_BASED_OUTPATIENT_CLINIC_OR_DEPARTMENT_OTHER): Payer: Medicare Other | Admitting: Oncology

## 2017-08-20 ENCOUNTER — Ambulatory Visit: Payer: Medicare Other | Admitting: Gastroenterology

## 2017-08-20 VITALS — BP 164/81 | HR 69 | Resp 17 | Wt 160.6 lb

## 2017-08-20 VITALS — BP 144/76 | HR 54 | Temp 96.4°F | Resp 20 | Wt 160.5 lb

## 2017-08-20 VITALS — BP 144/71 | HR 55 | Resp 20

## 2017-08-20 DIAGNOSIS — I129 Hypertensive chronic kidney disease with stage 1 through stage 4 chronic kidney disease, or unspecified chronic kidney disease: Secondary | ICD-10-CM

## 2017-08-20 DIAGNOSIS — C9110 Chronic lymphocytic leukemia of B-cell type not having achieved remission: Secondary | ICD-10-CM | POA: Diagnosis not present

## 2017-08-20 DIAGNOSIS — Z5112 Encounter for antineoplastic immunotherapy: Secondary | ICD-10-CM | POA: Diagnosis not present

## 2017-08-20 DIAGNOSIS — D649 Anemia, unspecified: Secondary | ICD-10-CM

## 2017-08-20 DIAGNOSIS — D693 Immune thrombocytopenic purpura: Secondary | ICD-10-CM

## 2017-08-20 DIAGNOSIS — N183 Chronic kidney disease, stage 3 (moderate): Secondary | ICD-10-CM

## 2017-08-20 DIAGNOSIS — I2510 Atherosclerotic heart disease of native coronary artery without angina pectoris: Secondary | ICD-10-CM

## 2017-08-20 DIAGNOSIS — D500 Iron deficiency anemia secondary to blood loss (chronic): Secondary | ICD-10-CM | POA: Diagnosis not present

## 2017-08-20 DIAGNOSIS — D696 Thrombocytopenia, unspecified: Secondary | ICD-10-CM

## 2017-08-20 DIAGNOSIS — Z87891 Personal history of nicotine dependence: Secondary | ICD-10-CM

## 2017-08-20 DIAGNOSIS — E039 Hypothyroidism, unspecified: Secondary | ICD-10-CM | POA: Diagnosis not present

## 2017-08-20 LAB — CBC WITH DIFFERENTIAL/PLATELET
BASOS ABS: 0 10*3/uL (ref 0–0.1)
Basophils Relative: 0 %
Eosinophils Absolute: 0.1 10*3/uL (ref 0–0.7)
Eosinophils Relative: 2 %
HEMATOCRIT: 30.2 % — AB (ref 40.0–52.0)
Hemoglobin: 10.4 g/dL — ABNORMAL LOW (ref 13.0–18.0)
LYMPHS ABS: 3.6 10*3/uL (ref 1.0–3.6)
LYMPHS PCT: 59 %
MCH: 32.1 pg (ref 26.0–34.0)
MCHC: 34.3 g/dL (ref 32.0–36.0)
MCV: 93.6 fL (ref 80.0–100.0)
MONO ABS: 0.4 10*3/uL (ref 0.2–1.0)
Monocytes Relative: 6 %
NEUTROS ABS: 2 10*3/uL (ref 1.4–6.5)
Neutrophils Relative %: 33 %
Platelets: 55 10*3/uL — ABNORMAL LOW (ref 150–440)
RBC: 3.23 MIL/uL — AB (ref 4.40–5.90)
RDW: 19 % — ABNORMAL HIGH (ref 11.5–14.5)
WBC: 6 10*3/uL (ref 3.8–10.6)

## 2017-08-20 MED ORDER — SODIUM CHLORIDE 0.9 % IV SOLN
375.0000 mg/m2 | Freq: Once | INTRAVENOUS | Status: AC
  Administered 2017-08-20: 700 mg via INTRAVENOUS
  Filled 2017-08-20: qty 50

## 2017-08-20 MED ORDER — MEPERIDINE HCL 25 MG/ML IJ SOLN: 25.0000 mg | Freq: Once | INTRAMUSCULAR | Status: DC | PRN

## 2017-08-20 MED ORDER — DIPHENHYDRAMINE HCL 25 MG PO CAPS
25.0000 mg | ORAL_CAPSULE | Freq: Once | ORAL | Status: AC
  Administered 2017-08-20: 25 mg via ORAL
  Filled 2017-08-20: qty 1

## 2017-08-20 MED ORDER — ACETAMINOPHEN 325 MG PO TABS
650.0000 mg | ORAL_TABLET | Freq: Once | ORAL | Status: AC
  Administered 2017-08-20: 650 mg via ORAL
  Filled 2017-08-20: qty 2

## 2017-08-20 MED ORDER — SODIUM CHLORIDE 0.9 % IV SOLN
Freq: Once | INTRAVENOUS | Status: AC
  Administered 2017-08-20: 09:00:00 via INTRAVENOUS
  Filled 2017-08-20: qty 1000

## 2017-08-20 NOTE — Progress Notes (Signed)
Patient reports his blood sugars have been running high since infusion last week.

## 2017-08-20 NOTE — Progress Notes (Signed)
[name], MD [address]  [place]  [place], [place] [address]  Main: [phone]  Fax: [phone]    Gastroenterology Consultation  Referring Provider:     [name], MD Primary Care Physician:  [name], MD Primary Gastroenterologist:  Dr. [name] Reason for Consultation:     Iron deficiency anemia        HPI:   Scott [name] is a 77 y.o. male referred by Dr. [name], MD  for consultation & management of and deficiency anemia. Patient was admitted to [facility] in 07/2017 with symptomatic anemia, hemoglobin 5.5, platelets of 8 with known history of CLL. His pancytopenia was thought to be secondary to CLL. Endoscopic evaluation was not performed during that admission because of severe Thrombocytopenia in the setting of CLL.patient received prednisone and IVIG as inpatient. Patient is closely followed by Dr. [name] at [facility] and started on rituximab, received 2 infusions. His platelet count is 55 today, hemoglobin 10.4. He also has severe iron deficiency as well as borderline low B12 levels. He received parenteral iron therapy as well as oral B12. His ferritin levels improved as well as B12 levels. Patient is here to discuss about endoscopy evaluation. He denies abdominal pain, nausea, vomiting, rectal bleeding, melena, hematemesis, hematochezia. He stopped oral iron and B12  NSAIDs:  none  Antiplts/Anticoagulants/Anti thrombotics: none  GI Procedures: underwent colonoscopy in 2010, subcentimeter polyps were removed Underwent colonoscopy in 2016 [address], 10 mm polyp from ascending colon was removed  Past Medical History:  Diagnosis Date  . Acquired hand deformity 1962   hand saw accident at work  . Anemia 10/11/2011  . Arthritis   . Bradycardia 10/10/2011  . CAD (coronary artery disease) 10/10/2011   MI s/p PTCA (Dx-OM2 proximal concentric stenosis)  . CKD (chronic kidney disease) stage 3, GFR  30-59 ml/min (HCC)    [name]  . CLL (chronic lymphocytic leukemia) ([place])   . Colon polyps   . Generalized headaches    frequent  . Glaucoma    s/p laser surgery  . HLD (hyperlipidemia)   . Hypertension   . Hypothyroidism 10/10/2011  . Thrombocytopenia ([place]) 10/11/2011  . Type 2 diabetes with nephropathy [facility])    DM refresher course [facility] (04/2013)    Past Surgical History:  Procedure Laterality Date  . BACK SURGERY     cervical neck  . CATARACT EXTRACTION, BILATERAL Bilateral 2017  . COLONOSCOPY  11/2008   1 polyp, diverticulosis, rec rpt 5 yrs (Dr. Oletta Lamas, [name])  . COLONOSCOPY  06/2014   hyperplastic polyp, rpt 5 yrs ([facility])  . EYE SURGERY  2012   laser surgery for glaucoma  . PTCA  1994, 1995  . US ECHOCARDIOGRAPHY  10/2013   inferior wall hypokinesis, mild LVH, EF 50-55%, mild MR and LA dilation    Current Outpatient Medications:  .  acetaminophen (TYLENOL) 650 MG CR tablet, Take 650 mg by mouth every 8 (eight) hours as needed for pain., Disp: , Rfl:  .  atorvastatin (LIPITOR) 80 MG tablet, TAKE 1 TABLET BY MOUTH ONCE DAILY, Disp: 90 tablet, Rfl: 3 .  carvedilol (COREG) 6.25 MG tablet, TAKE 1 TABLET BY MOUTH TWICE DAILY, Disp: 180 tablet, Rfl: 3 .  ferrous sulfate 325 (65 FE) MG tablet, Take 325 mg by mouth daily with breakfast., Disp: , Rfl:  .  furosemide (LASIX) 20 MG tablet, Take 1 tablet (20 mg total) by mouth daily., Disp: 30 tablet, Rfl: 3 .  glucose blood (ONE TOUCH ULTRA TEST) test strip, USE ONE STRIP TO CHECK GLUCOSE TWICE DAILY AND  AS  NEEDED. Dx code:  E11.21, Disp: 150 each, Rfl: 3 .  insulin aspart (NOVOLOG) 100 UNIT/ML FlexPen, Inject 5 Units into the skin 3 (three) times daily with meals. (3-7 units), Disp: 15 mL, Rfl: 1 .  Insulin Detemir (LEVEMIR FLEXTOUCH) 100 UNIT/ML Pen, Inject 25 Units into the skin daily at 10 pm., Disp: 15 pen, Rfl: 3 .  levothyroxine (SYNTHROID, LEVOTHROID) 50 MCG tablet, TAKE 1 TABLET BY MOUTH ONCE DAILY, Disp: 90 tablet, Rfl:  2 .  lisinopril (PRINIVIL,ZESTRIL) 10 MG tablet, Take 1 tablet (10 mg total) by mouth daily., Disp: 30 tablet, Rfl: 3 .  niacin (SLO-NIACIN) 500 MG tablet, Take 1,000 mg by mouth at bedtime. , Disp: , Rfl:  .  nitroGLYCERIN (NITROSTAT) 0.4 MG SL tablet, Place 1 tablet (0.4 mg total) under the tongue every 5 (five) minutes as needed for chest pain ((MAX of 3 doses))., Disp: 25 tablet, Rfl: 0 .  pantoprazole (PROTONIX) 40 MG tablet, Take 1 tablet (40 mg total) by mouth daily., Disp: 30 tablet, Rfl: 0 .  traZODone (DESYREL) 50 MG tablet, TAKE 1/2 TO 1 (ONE-HALF TO ONE) TABLET BY MOUTH AT BEDTIME AS NEEDED FOR SLEEP, Disp: 30 tablet, Rfl: 2 .  vitamin B-12 1000 MCG tablet, Take 1 tablet (1,000 mcg total) by mouth daily., Disp: 120 tablet, Rfl: 0 .  aspirin EC 81 MG tablet, Take 1 tablet (81 mg total) by mouth daily. (Patient not taking: Reported on 07/17/2017), Disp: 90 tablet, Rfl: 3 .  diclofenac sodium (VOLTAREN) 1 % GEL, Apply topically 4 (four) times daily., Disp: , Rfl:     Family History  Problem Relation Age of Onset  . Diabetes Sister   . Stomach cancer Sister 10  . Pneumonia Father        caused death  . Other Brother        no communication with brother so unsure of any health conditions  . Coronary artery disease Son 59       5v CABG and stents  . Hyperlipidemia Sister   . Stroke Neg Hx   . Heart attack Neg Hx      Social History   Tobacco Use  . Smoking status: Former Smoker    Last attempt to quit: 03/04/1978    Years since quitting: 39.4  . Smokeless tobacco: Never Used  Substance Use Topics  . Alcohol use: No  . Drug use: No    Allergies as of 08/20/2017  . (No Known Allergies)    Review of Systems:    All systems reviewed and negative except where noted in HPI.   Physical Exam:  BP (!) 164/81 (BP Location: Left Arm, Patient Position: Sitting, Cuff Size: Normal)   Pulse 69   Resp 17   Wt 160 lb 9.6 oz (72.8 kg)   BMI 23.04 kg/m  No LMP for male  patient.  General:   Alert,  Well-developed, well-nourished, pleasant and cooperative in NAD Head:  Normocephalic and atraumatic. Eyes:  Sclera clear, no icterus.   Conjunctiva pink. Ears:  Normal auditory acuity. Nose:  No deformity, discharge, or lesions. Mouth:  No deformity or lesions,oropharynx pink & moist. Neck:  Supple; no masses or thyromegaly. Lungs:  Respirations even and unlabored.  Clear throughout to auscultation.   No wheezes, crackles, or rhonchi. No acute distress. Heart:  Regular rate and rhythm; no murmurs, clicks, rubs, or  gallops. Abdomen:  Normal bowel sounds. Soft, non-tender and non-distended without masses, hepatosplenomegaly or hernias noted.  No guarding or rebound tenderness.   Rectal: Not performed Msk:  Symmetrical without gross deformities. Good, equal movement & strength bilaterally. Pulses:  Normal pulses noted. Extremities:  No clubbing or edema.  No cyanosis. Neurologic:  Alert and oriented x3;  grossly normal neurologically. Skin:  Intact without significant lesions or rashes. No jaundice. Lymph Nodes:  No significant cervical adenopathy. Psych:  Alert and cooperative. Normal mood and affect.  Imaging Studies: No abdominal imaging  Assessment and Plan:   Scott [name] is a 77 y.o. Caucasian male with CLL with anemia and Thrombocytopenia, currently on rituximab, cycles 2 out of 4, is seen as a hospital follow-up of iron deficiency anemia  Recommend endoscopic evaluation after completing the course of rituximab and when platelets are consistently about 50  I have discussed alternative options, risks & benefits,  which include, but are not limited to, bleeding, infection, perforation,respiratory complication & drug reaction.  The patient agrees with this plan & written consent will be obtained.     Follow up based on the endoscopy Evaluation   [name], MD

## 2017-08-21 ENCOUNTER — Ambulatory Visit: Payer: Medicare Other | Admitting: Family Medicine

## 2017-08-22 ENCOUNTER — Ambulatory Visit (INDEPENDENT_AMBULATORY_CARE_PROVIDER_SITE_OTHER): Payer: Medicare Other | Admitting: Family Medicine

## 2017-08-22 ENCOUNTER — Encounter: Payer: Self-pay | Admitting: Family Medicine

## 2017-08-22 VITALS — BP 136/82 | HR 53 | Temp 97.8°F | Ht 71.0 in | Wt 159.5 lb

## 2017-08-22 DIAGNOSIS — I2510 Atherosclerotic heart disease of native coronary artery without angina pectoris: Secondary | ICD-10-CM

## 2017-08-22 DIAGNOSIS — E1142 Type 2 diabetes mellitus with diabetic polyneuropathy: Secondary | ICD-10-CM | POA: Diagnosis not present

## 2017-08-22 DIAGNOSIS — K921 Melena: Secondary | ICD-10-CM

## 2017-08-22 DIAGNOSIS — C9110 Chronic lymphocytic leukemia of B-cell type not having achieved remission: Secondary | ICD-10-CM

## 2017-08-22 DIAGNOSIS — D62 Acute posthemorrhagic anemia: Secondary | ICD-10-CM

## 2017-08-22 DIAGNOSIS — C9190 Lymphoid leukemia, unspecified not having achieved remission: Secondary | ICD-10-CM

## 2017-08-22 DIAGNOSIS — E1165 Type 2 diabetes mellitus with hyperglycemia: Secondary | ICD-10-CM

## 2017-08-22 DIAGNOSIS — D693 Immune thrombocytopenic purpura: Secondary | ICD-10-CM | POA: Diagnosis not present

## 2017-08-22 DIAGNOSIS — E1121 Type 2 diabetes mellitus with diabetic nephropathy: Secondary | ICD-10-CM | POA: Diagnosis not present

## 2017-08-22 DIAGNOSIS — R600 Localized edema: Secondary | ICD-10-CM | POA: Diagnosis not present

## 2017-08-22 DIAGNOSIS — IMO0002 Reserved for concepts with insufficient information to code with codable children: Secondary | ICD-10-CM

## 2017-08-22 DIAGNOSIS — R21 Rash and other nonspecific skin eruption: Secondary | ICD-10-CM | POA: Diagnosis not present

## 2017-08-22 MED ORDER — GLUCOSE BLOOD VI STRP: ORAL_STRIP | 3 refills | Status: DC

## 2017-08-22 NOTE — Progress Notes (Signed)
BP 136/82 (BP Location: Left Arm, Patient Position: Sitting, Cuff Size: Normal)   Pulse (!) 53   Temp 97.8 F (36.6 C) (Oral)   Ht 5\' 11"  (1.803 m)   Wt 159 lb 8 oz (72.3 kg)   SpO2 98%   BMI 22.25 kg/m    CC: 3 mo DM f/u Subjective:    Patient ID: Scott [name], male    DOB: [birth year], 77 y.o.   MRN: [id number]  HPI: XACHARY HAMBLY is a 77 y.o. male presenting on 08/22/2017 for Diabetes (Here for 3 mo f/u.)   Recent hospitalization for chest pain and weakness with low platelets and worsened anemia thought ITP in setting of CLL. Received blood transfusions, IV iron, and high dose prednisone and IVIG. Heme positive stools in hospital. Was started on B12 supplementation for B12 deficiency. Code status discussion - DNR per hospital records.   Saw GI ([facility]) - rec EGD/colonoscopy after completes rituxan infusion treatment - scheduled 09/11/2017. Now on daily protonix.   ITP - Has received 2 rituximab infusions, plt count fluctuating. Planned total 4 infusions.   Itchy rash present for months lower abdomen and lower back into buttocks. Has been using anti itch lotion with temporary relief. No other new lotions, detergents, meds, foods.   Chest pain - presumed due to anemia, improved after transfusions. Last nuclear stress test was 2017 - ER 58%, fixed small defect at apex - low risk study without signs of ischemia.  DM - does regularly check sugars and brings log: some high sugars 400-500s after IV steroids in hospital and around rituximab infusions, otherwise running stable. Compliant with antihyperglycemic regimen which includes: levemir 25u daily and novolog 3-7 u TID with meals. Denies low sugars or hypoglycemic symptoms. Last diabetic eye exam 03/2017. Pneumovax: 03/2011. Prevnar: 2015. Glucometer brand: one-touch. DSME: refresher course 2015. Foot exam due.  Lab Results  Component Value Date   HGBA1C 5.0 07/09/2017   Diabetic Foot Exam - Simple   Simple Foot Form Diabetic Foot  exam was performed with the following findings:  Yes 08/22/2017 10:23 AM  Visual Inspection No deformities, no ulcerations, no other skin breakdown bilaterally:  Yes Sensation Testing Intact to touch and monofilament testing bilaterally:  Yes Pulse Check Posterior Tibialis and Dorsalis pulse intact bilaterally:  Yes Comments    Lab Results  Component Value Date   MICROALBUR 2.3 (H) 02/01/2015     Brings BP log - 110-160/50-70s, HR 50-60s  DATE OF ADMISSION:  07/08/2017 DATE OF DISCHARGE: 07/15/2017 TCM f/u phone call not performed. Pt did not return for TCM visit.   D/C diagnosis: Active Problems:   GI bleed   Acute blood loss anemia   Thrombocytopenia  Relevant past medical, surgical, family and social history reviewed and updated as indicated. Interim medical history since our last visit reviewed. Allergies and medications reviewed and updated. Outpatient Medications Prior to Visit  Medication Sig Dispense Refill  . acetaminophen (TYLENOL) 650 MG CR tablet Take 650 mg by mouth every 8 (eight) hours as needed for pain.    Marland Kitchen aspirin EC 81 MG tablet Take 1 tablet (81 mg total) by mouth daily. 90 tablet 3  . atorvastatin (LIPITOR) 80 MG tablet TAKE 1 TABLET BY MOUTH ONCE DAILY 90 tablet 3  . carvedilol (COREG) 6.25 MG tablet TAKE 1 TABLET BY MOUTH TWICE DAILY 180 tablet 3  . diclofenac sodium (VOLTAREN) 1 % GEL Apply topically 4 (four) times daily.    . ferrous sulfate 325 (65  FE) MG tablet Take 325 mg by mouth daily with breakfast.    . furosemide (LASIX) 20 MG tablet Take 1 tablet (20 mg total) by mouth daily. 30 tablet 3  . insulin aspart (NOVOLOG) 100 UNIT/ML FlexPen Inject 5 Units into the skin 3 (three) times daily with meals. (3-7 units) 15 mL 1  . Insulin Detemir (LEVEMIR FLEXTOUCH) 100 UNIT/ML Pen Inject 25 Units into the skin daily at 10 pm. 15 pen 3  . levothyroxine (SYNTHROID, LEVOTHROID) 50 MCG tablet TAKE 1 TABLET BY MOUTH ONCE DAILY 90 tablet 2  . lisinopril  (PRINIVIL,ZESTRIL) 10 MG tablet Take 1 tablet (10 mg total) by mouth daily. 30 tablet 3  . niacin (SLO-NIACIN) 500 MG tablet Take 1,000 mg by mouth at bedtime.     . nitroGLYCERIN (NITROSTAT) 0.4 MG SL tablet Place 1 tablet (0.4 mg total) under the tongue every 5 (five) minutes as needed for chest pain ((MAX of 3 doses)). 25 tablet 0  . pantoprazole (PROTONIX) 40 MG tablet Take 1 tablet (40 mg total) by mouth daily. 30 tablet 0  . traZODone (DESYREL) 50 MG tablet TAKE 1/2 TO 1 (ONE-HALF TO ONE) TABLET BY MOUTH AT BEDTIME AS NEEDED FOR SLEEP 30 tablet 2  . vitamin B-12 1000 MCG tablet Take 1 tablet (1,000 mcg total) by mouth daily. 120 tablet 0  . glucose blood (ONE TOUCH ULTRA TEST) test strip USE ONE STRIP TO CHECK GLUCOSE TWICE DAILY AND  AS  NEEDED. Dx code:  E11.21 150 each 3   No facility-administered medications prior to visit.      Per HPI unless specifically indicated in ROS section below Review of Systems     Objective:    BP 136/82 (BP Location: Left Arm, Patient Position: Sitting, Cuff Size: Normal)   Pulse (!) 53   Temp 97.8 F (36.6 C) (Oral)   Ht 5\' 11"  (1.803 m)   Wt 159 lb 8 oz (72.3 kg)   SpO2 98%   BMI 22.25 kg/m   Wt Readings from Last 3 Encounters:  08/22/17 159 lb 8 oz (72.3 kg)  08/20/17 160 lb 9.6 oz (72.8 kg)  08/20/17 160 lb 8 oz (72.8 kg)    Physical Exam  Constitutional: He appears well-developed and well-nourished. No distress.  HENT:  Head: Normocephalic and atraumatic.  Right Ear: External ear normal.  Left Ear: External ear normal.  Nose: Nose normal.  Mouth/Throat: Oropharynx is clear and moist. No oropharyngeal exudate.  Eyes: Pupils are equal, round, and reactive to light. Conjunctivae and EOM are normal. No scleral icterus.  Neck: Normal range of motion. Neck supple.  Cardiovascular: Normal rate, regular rhythm, normal heart sounds and intact distal pulses.  No murmur heard. Pulmonary/Chest: Effort normal and breath sounds normal. No  respiratory distress. He has no wheezes. He has no rales.  Musculoskeletal: He exhibits edema (1+ pedal edema).  See HPI for foot exam if done  Lymphadenopathy:    He has no cervical adenopathy.  Skin: Skin is warm and dry. No rash noted.  Psychiatric: He has a normal mood and affect.  Nursing note and vitals reviewed.  Results for orders placed or performed in visit on 08/20/17  CBC with Differential/Platelet  Result Value Ref Range   WBC 6.0 3.8 - 10.6 K/uL   RBC 3.23 (L) 4.40 - 5.90 MIL/uL   Hemoglobin 10.4 (L) 13.0 - 18.0 g/dL   HCT 30.2 (L) 40.0 - 52.0 %   MCV 93.6 80.0 - 100.0 fL  MCH 32.1 26.0 - 34.0 pg   MCHC 34.3 32.0 - 36.0 g/dL   RDW 19.0 (H) 11.5 - 14.5 %   Platelets 55 (L) 150 - 440 K/uL   Neutrophils Relative % 33 %   Neutro Abs 2.0 1.4 - 6.5 K/uL   Lymphocytes Relative 59 %   Lymphs Abs 3.6 1.0 - 3.6 K/uL   Monocytes Relative 6 %   Monocytes Absolute 0.4 0.2 - 1.0 K/uL   Eosinophils Relative 2 %   Eosinophils Absolute 0.1 0 - 0.7 K/uL   Basophils Relative 0 %   Basophils Absolute 0.0 0 - 0.1 K/uL   Lab Results  Component Value Date   CREATININE 1.20 07/13/2017   BUN 41 (H) 07/13/2017   NA 140 07/13/2017   K 3.6 07/13/2017   CL 108 07/13/2017   CO2 27 07/13/2017    Lab Results  Component Value Date   HGBA1C 5.0 07/09/2017   Lab Results  Component Value Date   LABPROT 14.1 07/08/2017   Lab Results  Component Value Date   TSH 4.365 07/09/2017       Assessment & Plan:   Problem List Items Addressed This Visit    Uncontrolled type 2 diabetes mellitus with nephropathy ([place]) - Primary    Chronic, fructosamine likely better estimate of sugar control given chronic anemia. Will nee this checked next labwork.  Recent hyperglycemia likely related to IV steroid use.  No changes today.  He is checking sugars > three times a day, and injecting insulin 4 times daily - will see if he qualifies for freestyle libre system.       Skin rash    Itchy rash on  trunk - lower abdomen and lower back into buttock L>R. ?dry sin vs allergic dermatitis. rec mid potency steroid cream and regular moisturizing cream. I also suggested he let Dr [name] know however rash present prior to starting rituximab.       Pedal edema    Noted worsening recently - I did ask him to double up on lasix x2 days to treat this. Update [place] with effect.       Idiopathic thrombocytopenic purpura (ITP) ([facility])    In setting of CLL. Receiving 4 infusions of single agent rituximab. Appreciate heme care.      GI bleed    Heme positive stools during hospitalization in setting of severe thrombocytopenia. Upcoming luminal evaluation next month with GI.       Diabetic neuropathy ([facility])   CLL (chronic lymphocytic leukemia) ([place])    Appreciate heme care.       Acute blood loss anemia    Presumed from GI bleed - pending GI eval.           Meds ordered this encounter  Medications  . glucose blood (ONE TOUCH ULTRA TEST) test strip    Sig: USE TO CHECK GLUCOSE three times DAILY AND AS NEEDED. Dx code:  E11.21    Dispense:  300 each    Refill:  3  . triamcinolone cream (KENALOG) 0.1 %    Sig: Apply 1 application topically 2 (two) times daily. Apply to AA.    Dispense:  45 g    Refill:  0   No orders of the defined types were placed in this encounter.   Follow up plan: Return in about 3 months (around 11/22/2017) for follow up visit.  [name], MD

## 2017-08-22 NOTE — Patient Instructions (Addendum)
We will check into freestyle libre stick-less glucose meter. I have refilled one-touch strips.  Double up on lasix for 2 days to treat leg swelling.  For dry skin rash - use steroid cream in the morning and moisturizing cream like aveeno or eucerin during the day for 2 weeks then update [place].  Continue current medicines.  Return in 3 months for follow up visit.

## 2017-08-23 ENCOUNTER — Emergency Department (HOSPITAL_COMMUNITY): Payer: No Typology Code available for payment source

## 2017-08-23 ENCOUNTER — Telehealth: Payer: Self-pay | Admitting: Family Medicine

## 2017-08-23 ENCOUNTER — Other Ambulatory Visit: Payer: Self-pay

## 2017-08-23 ENCOUNTER — Encounter (HOSPITAL_COMMUNITY): Payer: Self-pay

## 2017-08-23 ENCOUNTER — Emergency Department (HOSPITAL_COMMUNITY)
Admission: EM | Admit: 2017-08-23 | Discharge: 2017-08-24 | Disposition: A | Discharge status: Home / Self Care | Payer: No Typology Code available for payment source | Attending: Emergency Medicine | Admitting: Emergency Medicine

## 2017-08-23 DIAGNOSIS — S3991XA Unspecified injury of abdomen, initial encounter: Secondary | ICD-10-CM | POA: Diagnosis not present

## 2017-08-23 DIAGNOSIS — S59912A Unspecified injury of left forearm, initial encounter: Secondary | ICD-10-CM | POA: Diagnosis not present

## 2017-08-23 DIAGNOSIS — R0789 Other chest pain: Secondary | ICD-10-CM | POA: Diagnosis not present

## 2017-08-23 DIAGNOSIS — S0003XA Contusion of scalp, initial encounter: Secondary | ICD-10-CM | POA: Diagnosis not present

## 2017-08-23 DIAGNOSIS — Z794 Long term (current) use of insulin: Secondary | ICD-10-CM | POA: Insufficient documentation

## 2017-08-23 DIAGNOSIS — T07XXXA Unspecified multiple injuries, initial encounter: Secondary | ICD-10-CM | POA: Insufficient documentation

## 2017-08-23 DIAGNOSIS — N183 Chronic kidney disease, stage 3 (moderate): Secondary | ICD-10-CM | POA: Insufficient documentation

## 2017-08-23 DIAGNOSIS — Z79899 Other long term (current) drug therapy: Secondary | ICD-10-CM | POA: Insufficient documentation

## 2017-08-23 DIAGNOSIS — M7918 Myalgia, other site: Secondary | ICD-10-CM | POA: Insufficient documentation

## 2017-08-23 DIAGNOSIS — Y92410 Unspecified street and highway as the place of occurrence of the external cause: Secondary | ICD-10-CM | POA: Insufficient documentation

## 2017-08-23 DIAGNOSIS — S0990XA Unspecified injury of head, initial encounter: Secondary | ICD-10-CM | POA: Insufficient documentation

## 2017-08-23 DIAGNOSIS — S80811A Abrasion, right lower leg, initial encounter: Secondary | ICD-10-CM | POA: Diagnosis not present

## 2017-08-23 DIAGNOSIS — R079 Chest pain, unspecified: Secondary | ICD-10-CM | POA: Diagnosis not present

## 2017-08-23 DIAGNOSIS — B354 Tinea corporis: Secondary | ICD-10-CM | POA: Insufficient documentation

## 2017-08-23 DIAGNOSIS — S20212A Contusion of left front wall of thorax, initial encounter: Secondary | ICD-10-CM | POA: Diagnosis not present

## 2017-08-23 DIAGNOSIS — Z87891 Personal history of nicotine dependence: Secondary | ICD-10-CM | POA: Diagnosis not present

## 2017-08-23 DIAGNOSIS — R21 Rash and other nonspecific skin eruption: Secondary | ICD-10-CM

## 2017-08-23 DIAGNOSIS — Z23 Encounter for immunization: Secondary | ICD-10-CM | POA: Diagnosis not present

## 2017-08-23 DIAGNOSIS — Z7982 Long term (current) use of aspirin: Secondary | ICD-10-CM | POA: Diagnosis not present

## 2017-08-23 DIAGNOSIS — I2510 Atherosclerotic heart disease of native coronary artery without angina pectoris: Secondary | ICD-10-CM | POA: Insufficient documentation

## 2017-08-23 DIAGNOSIS — I10 Essential (primary) hypertension: Secondary | ICD-10-CM | POA: Diagnosis not present

## 2017-08-23 DIAGNOSIS — Y998 Other external cause status: Secondary | ICD-10-CM | POA: Diagnosis not present

## 2017-08-23 DIAGNOSIS — M542 Cervicalgia: Secondary | ICD-10-CM | POA: Insufficient documentation

## 2017-08-23 DIAGNOSIS — E039 Hypothyroidism, unspecified: Secondary | ICD-10-CM | POA: Diagnosis not present

## 2017-08-23 DIAGNOSIS — S51812A Laceration without foreign body of left forearm, initial encounter: Secondary | ICD-10-CM | POA: Insufficient documentation

## 2017-08-23 DIAGNOSIS — Y9389 Activity, other specified: Secondary | ICD-10-CM | POA: Insufficient documentation

## 2017-08-23 DIAGNOSIS — S2220XA Unspecified fracture of sternum, initial encounter for closed fracture: Secondary | ICD-10-CM | POA: Diagnosis not present

## 2017-08-23 DIAGNOSIS — R600 Localized edema: Secondary | ICD-10-CM | POA: Insufficient documentation

## 2017-08-23 DIAGNOSIS — E1140 Type 2 diabetes mellitus with diabetic neuropathy, unspecified: Secondary | ICD-10-CM | POA: Diagnosis not present

## 2017-08-23 DIAGNOSIS — E1122 Type 2 diabetes mellitus with diabetic chronic kidney disease: Secondary | ICD-10-CM | POA: Insufficient documentation

## 2017-08-23 DIAGNOSIS — S199XXA Unspecified injury of neck, initial encounter: Secondary | ICD-10-CM | POA: Diagnosis not present

## 2017-08-23 DIAGNOSIS — S51819A Laceration without foreign body of unspecified forearm, initial encounter: Secondary | ICD-10-CM

## 2017-08-23 DIAGNOSIS — M79632 Pain in left forearm: Secondary | ICD-10-CM | POA: Diagnosis not present

## 2017-08-23 LAB — COMPREHENSIVE METABOLIC PANEL
ALT: 14 U/L — ABNORMAL LOW (ref 17–63)
AST: 15 U/L (ref 15–41)
Albumin: 3.8 g/dL (ref 3.5–5.0)
Alkaline Phosphatase: 65 U/L (ref 38–126)
Anion gap: 11 (ref 5–15)
BUN: 33 mg/dL — ABNORMAL HIGH (ref 6–20)
CALCIUM: 9.5 mg/dL (ref 8.9–10.3)
CO2: 24 mmol/L (ref 22–32)
CREATININE: 1.75 mg/dL — AB (ref 0.61–1.24)
Chloride: 105 mmol/L (ref 101–111)
GFR calc Af Amer: 42 mL/min — ABNORMAL LOW (ref >60)
GFR, EST NON AFRICAN AMERICAN: 36 mL/min — AB (ref >60)
GLUCOSE: 324 mg/dL — AB (ref 65–99)
Potassium: 4.8 mmol/L (ref 3.5–5.1)
Sodium: 140 mmol/L (ref 135–145)
TOTAL PROTEIN: 6.3 g/dL — AB (ref 6.5–8.1)
Total Bilirubin: 0.8 mg/dL (ref 0.3–1.2)

## 2017-08-23 LAB — SAMPLE TO BLOOD BANK

## 2017-08-23 LAB — CBC
HCT: 34.8 % — ABNORMAL LOW (ref 39.0–52.0)
Hemoglobin: 10.5 g/dL — ABNORMAL LOW (ref 13.0–17.0)
MCH: 29.9 pg (ref 26.0–34.0)
MCHC: 30.2 g/dL (ref 30.0–36.0)
MCV: 99.1 fL (ref 78.0–100.0)
PLATELETS: 74 10*3/uL — AB (ref 150–400)
RBC: 3.51 MIL/uL — ABNORMAL LOW (ref 4.22–5.81)
RDW: 17.4 % — AB (ref 11.5–15.5)
WBC: 7.5 10*3/uL (ref 4.0–10.5)

## 2017-08-23 LAB — PROTIME-INR
INR: 0.98
Prothrombin Time: 12.9 seconds (ref 11.4–15.2)

## 2017-08-23 LAB — CBG MONITORING, ED: Glucose-Capillary: 308 mg/dL — ABNORMAL HIGH (ref 65–99)

## 2017-08-23 MED ORDER — TETANUS-DIPHTH-ACELL PERTUSSIS 5-2.5-18.5 LF-MCG/0.5 IM SUSP
0.5000 mL | Freq: Once | INTRAMUSCULAR | Status: AC
  Administered 2017-08-23: 0.5 mL via INTRAMUSCULAR
  Filled 2017-08-23: qty 0.5

## 2017-08-23 MED ORDER — FREESTYLE LIBRE 14 DAY SENSOR MISC: 1.0000 | 3 refills | Status: DC

## 2017-08-23 MED ORDER — TRIAMCINOLONE ACETONIDE 0.1 % EX CREA: 1.0000 "application " | TOPICAL_CREAM | Freq: Two times a day (BID) | CUTANEOUS | 0 refills | Status: DC

## 2017-08-23 MED ORDER — OXYCODONE-ACETAMINOPHEN 5-325 MG PO TABS
1.0000 | ORAL_TABLET | Freq: Once | ORAL | Status: AC
  Administered 2017-08-23: 1 via ORAL
  Filled 2017-08-23: qty 1

## 2017-08-23 MED ORDER — IOHEXOL 300 MG/ML  SOLN
80.0000 mL | Freq: Once | INTRAMUSCULAR | Status: AC | PRN
  Administered 2017-08-23: 80 mL via INTRAVENOUS

## 2017-08-23 MED ORDER — FREESTYLE LIBRE 14 DAY READER DEVI: 1.0000 | 0 refills | Status: DC

## 2017-08-23 NOTE — Assessment & Plan Note (Signed)
In setting of CLL. Receiving 4 infusions of single agent rituximab. Appreciate heme care.

## 2017-08-23 NOTE — ED Notes (Signed)
Patient transported to CT 

## 2017-08-23 NOTE — Assessment & Plan Note (Addendum)
Chronic, fructosamine likely better estimate of sugar control given chronic anemia. Will nee this checked next labwork.  Recent hyperglycemia likely related to IV steroid use.  No changes today.  He is checking sugars > three times a day, and injecting insulin 4 times daily - will see if he qualifies for freestyle libre system.

## 2017-08-23 NOTE — Assessment & Plan Note (Addendum)
Itchy rash on trunk - lower abdomen and lower back into buttock L>R. ?dry sin vs allergic dermatitis. rec mid potency steroid cream and regular moisturizing cream. I also suggested he let Dr [name] know however rash present prior to starting rituximab.

## 2017-08-23 NOTE — Assessment & Plan Note (Signed)
Appreciate heme care.  

## 2017-08-23 NOTE — Assessment & Plan Note (Signed)
Heme positive stools during hospitalization in setting of severe thrombocytopenia. Upcoming luminal evaluation next month with GI.
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
## 2017-08-23 NOTE — Telephone Encounter (Signed)
plz call on Monday -  1. how is leg swelling after doubling lasix for 2 days? 2. For skin rash - any new detergents, soaps, shampoos, new foods or medicines or other changes that he could be reacting to? 3. Also - I have sent in freestyle libre sensor system for checking sugars to [facility] - plz call [facility] to see if he qualifies for this or what else we need to do to get him approved. He injects insulin 4 times a day, checks sugars >3 times a day.

## 2017-08-23 NOTE — Assessment & Plan Note (Signed)
Noted worsening recently - I did ask him to double up on lasix x2 days to treat this. Update [place] with effect.

## 2017-08-23 NOTE — Assessment & Plan Note (Signed)
Presumed from GI bleed - pending GI eval.

## 2017-08-24 MED ORDER — OXYCODONE-ACETAMINOPHEN 5-325 MG PO TABS: 1.0000 | ORAL_TABLET | ORAL | 0 refills | Status: DC | PRN

## 2017-08-24 NOTE — Discharge Instructions (Addendum)
Walk with a cane for extra stability while taking Percocet. Also recommend a stool softener to avoid constipation. Follow up with your doctor in 2 days for recheck and return here with any severe pain, difficulty breathing, chest pain, vomiting, passing out or for new concern.
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
## 2017-08-25 NOTE — Telephone Encounter (Signed)
Spoke with pt and provided following answers to Dr. Synthia Innocent questions:  1) Leg swelling is a lot better; 2) Also, says the ointment has helped a lot and the rash is better.  Pt had tried a new washing detergent but is going back to what he used to use.  I also, relayed message about the [employer] rx.  I told him I will call him back when I find out something.  Verbalizes understanding.

## 2017-08-26 NOTE — Telephone Encounter (Signed)
Spoke with Crystal at [employer] asking about [employer] reader and sensor rxs.  Medicare does not pay for these items.

## 2017-08-26 NOTE — Telephone Encounter (Signed)
Spoke with pt informing him there may have to pay $50.00 a month for the [employer] sensors.  Says he is willing to give it a try.

## 2017-08-27 ENCOUNTER — Other Ambulatory Visit: Payer: Self-pay

## 2017-08-27 ENCOUNTER — Encounter: Payer: Self-pay | Admitting: Oncology

## 2017-08-27 ENCOUNTER — Inpatient Hospital Stay: Payer: Medicare Other

## 2017-08-27 ENCOUNTER — Inpatient Hospital Stay (HOSPITAL_BASED_OUTPATIENT_CLINIC_OR_DEPARTMENT_OTHER): Payer: Medicare Other | Admitting: Oncology

## 2017-08-27 VITALS — BP 175/88 | HR 71 | Temp 96.8°F

## 2017-08-27 VITALS — BP 169/75 | HR 54 | Temp 98.0°F | Resp 12 | Ht 71.0 in | Wt 163.2 lb

## 2017-08-27 DIAGNOSIS — E039 Hypothyroidism, unspecified: Secondary | ICD-10-CM

## 2017-08-27 DIAGNOSIS — D693 Immune thrombocytopenic purpura: Secondary | ICD-10-CM

## 2017-08-27 DIAGNOSIS — C9110 Chronic lymphocytic leukemia of B-cell type not having achieved remission: Secondary | ICD-10-CM

## 2017-08-27 DIAGNOSIS — Z87891 Personal history of nicotine dependence: Secondary | ICD-10-CM

## 2017-08-27 DIAGNOSIS — I2510 Atherosclerotic heart disease of native coronary artery without angina pectoris: Secondary | ICD-10-CM

## 2017-08-27 DIAGNOSIS — Z5112 Encounter for antineoplastic immunotherapy: Secondary | ICD-10-CM | POA: Diagnosis not present

## 2017-08-27 DIAGNOSIS — R0789 Other chest pain: Secondary | ICD-10-CM | POA: Diagnosis not present

## 2017-08-27 DIAGNOSIS — D649 Anemia, unspecified: Secondary | ICD-10-CM

## 2017-08-27 DIAGNOSIS — D696 Thrombocytopenia, unspecified: Secondary | ICD-10-CM

## 2017-08-27 DIAGNOSIS — N183 Chronic kidney disease, stage 3 (moderate): Secondary | ICD-10-CM | POA: Diagnosis not present

## 2017-08-27 LAB — CBC WITH DIFFERENTIAL/PLATELET
BASOS ABS: 0 10*3/uL (ref 0–0.1)
Basophils Relative: 1 %
Eosinophils Absolute: 0.1 10*3/uL (ref 0–0.7)
Eosinophils Relative: 1 %
HEMATOCRIT: 31.9 % — AB (ref 40.0–52.0)
Hemoglobin: 10.7 g/dL — ABNORMAL LOW (ref 13.0–18.0)
LYMPHS PCT: 55 %
Lymphs Abs: 4.2 10*3/uL — ABNORMAL HIGH (ref 1.0–3.6)
MCH: 31.5 pg (ref 26.0–34.0)
MCHC: 33.7 g/dL (ref 32.0–36.0)
MCV: 93.4 fL (ref 80.0–100.0)
MONO ABS: 0.4 10*3/uL (ref 0.2–1.0)
Monocytes Relative: 5 %
NEUTROS ABS: 2.9 10*3/uL (ref 1.4–6.5)
Neutrophils Relative %: 38 %
Platelets: 74 10*3/uL — ABNORMAL LOW (ref 150–440)
RBC: 3.41 MIL/uL — ABNORMAL LOW (ref 4.40–5.90)
RDW: 18.4 % — AB (ref 11.5–14.5)
WBC: 7.6 10*3/uL (ref 3.8–10.6)

## 2017-08-27 MED ORDER — ACETAMINOPHEN 325 MG PO TABS
650.0000 mg | ORAL_TABLET | Freq: Once | ORAL | Status: AC
  Administered 2017-08-27: 650 mg via ORAL
  Filled 2017-08-27: qty 2

## 2017-08-27 MED ORDER — SODIUM CHLORIDE 0.9 % IV SOLN
375.0000 mg/m2 | Freq: Once | INTRAVENOUS | Status: AC
  Administered 2017-08-27: 700 mg via INTRAVENOUS
  Filled 2017-08-27: qty 50

## 2017-08-27 MED ORDER — DIPHENHYDRAMINE HCL 25 MG PO CAPS
25.0000 mg | ORAL_CAPSULE | Freq: Once | ORAL | Status: AC
  Administered 2017-08-27: 25 mg via ORAL
  Filled 2017-08-27: qty 1

## 2017-08-27 MED ORDER — SODIUM CHLORIDE 0.9 % IV SOLN
Freq: Once | INTRAVENOUS | Status: AC
  Administered 2017-08-27: 10:00:00 via INTRAVENOUS
  Filled 2017-08-27: qty 1000

## 2017-08-27 MED ORDER — SODIUM CHLORIDE 0.9 % IV SOLN: 375.0000 mg/m2 | Freq: Once | INTRAVENOUS | Status: DC

## 2017-08-27 NOTE — Progress Notes (Signed)
Soon after increasing Rituxan infusion to 200 ml/hr at 11:20 pt called this writer to chairside stating he was feeling "nervous, shaky on the inside", stopped Rituxan and started IVF at 999 ml/hr for 10 min. Pt reported feeling back to baseline, restarted infusion at 150 ml/hr and pt tolerated well. After 15 min increased Rituxan to standard 200 ml/hr and pt tolerated remainder of tx without complications. MD notified.
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
## 2017-08-27 NOTE — Telephone Encounter (Signed)
Reviewing chart we received, looks like patient would be eligible for this with traditional medicare through local DME - can we touch base with a local DME to see how we get Rx to them and if they do freestyle libre continuous glucose monitoring? Or touch base with Drug rep about how to get this set up for patient.

## 2017-08-28 NOTE — Telephone Encounter (Addendum)
Yes, I have reached out to our local rep to see if she can help [place] identify a local DME store that we can work with for the patient. So far, the ones contacted either do not file with insurance or do not even have the meters available  Will let you know what I find out.

## 2017-08-28 NOTE — Telephone Encounter (Signed)
Leafy Ro states she will take care of this for pt.
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
## 2017-09-02 NOTE — Telephone Encounter (Signed)
Received information from our rep and passed along to Glorianne Manchester, CMA to have Dr. Danise Mina complete order form and fax to a selected listed partnering commercial DME company that patient/physician have chosen.

## 2017-09-03 ENCOUNTER — Other Ambulatory Visit: Payer: Self-pay | Admitting: *Deleted

## 2017-09-03 ENCOUNTER — Telehealth: Payer: Self-pay | Admitting: Gastroenterology

## 2017-09-03 ENCOUNTER — Inpatient Hospital Stay (HOSPITAL_BASED_OUTPATIENT_CLINIC_OR_DEPARTMENT_OTHER): Payer: Medicare Other | Admitting: Oncology

## 2017-09-03 ENCOUNTER — Inpatient Hospital Stay: Payer: Medicare Other | Attending: Oncology

## 2017-09-03 ENCOUNTER — Other Ambulatory Visit: Payer: Self-pay

## 2017-09-03 ENCOUNTER — Inpatient Hospital Stay: Payer: Medicare Other

## 2017-09-03 VITALS — BP 122/64 | HR 57 | Temp 94.5°F | Resp 18 | Wt 158.5 lb

## 2017-09-03 DIAGNOSIS — D693 Immune thrombocytopenic purpura: Secondary | ICD-10-CM

## 2017-09-03 DIAGNOSIS — C9110 Chronic lymphocytic leukemia of B-cell type not having achieved remission: Secondary | ICD-10-CM | POA: Diagnosis not present

## 2017-09-03 DIAGNOSIS — Z87891 Personal history of nicotine dependence: Secondary | ICD-10-CM | POA: Diagnosis not present

## 2017-09-03 DIAGNOSIS — Z5112 Encounter for antineoplastic immunotherapy: Secondary | ICD-10-CM | POA: Diagnosis not present

## 2017-09-03 DIAGNOSIS — E039 Hypothyroidism, unspecified: Secondary | ICD-10-CM

## 2017-09-03 DIAGNOSIS — I2510 Atherosclerotic heart disease of native coronary artery without angina pectoris: Secondary | ICD-10-CM

## 2017-09-03 DIAGNOSIS — Z79899 Other long term (current) drug therapy: Secondary | ICD-10-CM | POA: Diagnosis not present

## 2017-09-03 DIAGNOSIS — D696 Thrombocytopenia, unspecified: Secondary | ICD-10-CM

## 2017-09-03 LAB — CBC WITH DIFFERENTIAL/PLATELET
BASOS ABS: 0 10*3/uL (ref 0–0.1)
Basophils Relative: 1 %
EOS ABS: 0.1 10*3/uL (ref 0–0.7)
Eosinophils Relative: 2 %
HCT: 33.6 % — ABNORMAL LOW (ref 40.0–52.0)
HEMOGLOBIN: 11.4 g/dL — AB (ref 13.0–18.0)
Lymphocytes Relative: 40 %
Lymphs Abs: 2.6 10*3/uL (ref 1.0–3.6)
MCH: 31.6 pg (ref 26.0–34.0)
MCHC: 33.9 g/dL (ref 32.0–36.0)
MCV: 93.2 fL (ref 80.0–100.0)
Monocytes Absolute: 0.4 10*3/uL (ref 0.2–1.0)
Monocytes Relative: 6 %
NEUTROS PCT: 51 %
Neutro Abs: 3.3 10*3/uL (ref 1.4–6.5)
Platelets: 79 10*3/uL — ABNORMAL LOW (ref 150–440)
RBC: 3.6 MIL/uL — AB (ref 4.40–5.90)
RDW: 17.7 % — ABNORMAL HIGH (ref 11.5–14.5)
WBC: 6.4 10*3/uL (ref 3.8–10.6)

## 2017-09-03 MED ORDER — DIPHENHYDRAMINE HCL 25 MG PO CAPS
25.0000 mg | ORAL_CAPSULE | Freq: Once | ORAL | Status: AC
  Administered 2017-09-03: 25 mg via ORAL
  Filled 2017-09-03: qty 1

## 2017-09-03 MED ORDER — SODIUM CHLORIDE 0.9 % IV SOLN
Freq: Once | INTRAVENOUS | Status: AC
  Administered 2017-09-03: 10:00:00 via INTRAVENOUS
  Filled 2017-09-03: qty 1000

## 2017-09-03 MED ORDER — SODIUM CHLORIDE 0.9 % IV SOLN: 375.0000 mg/m2 | Freq: Once | INTRAVENOUS | Status: DC

## 2017-09-03 MED ORDER — ACETAMINOPHEN 325 MG PO TABS
650.0000 mg | ORAL_TABLET | Freq: Once | ORAL | Status: AC
  Administered 2017-09-03: 650 mg via ORAL
  Filled 2017-09-03: qty 2

## 2017-09-03 MED ORDER — RITUXIMAB CHEMO INJECTION 500 MG/50ML
375.0000 mg/m2 | Freq: Once | INTRAVENOUS | Status: AC
  Administered 2017-09-03: 700 mg via INTRAVENOUS
  Filled 2017-09-03: qty 50

## 2017-09-03 MED ORDER — OXYCODONE-ACETAMINOPHEN 5-325 MG PO TABS: 1.0000 | ORAL_TABLET | ORAL | 0 refills | Status: DC | PRN

## 2017-09-03 NOTE — Telephone Encounter (Signed)
Faxed form today

## 2017-09-03 NOTE — Progress Notes (Signed)
Here for follow up. Stated overall feeling good. Showed staff rash-red mottled,itchy per pt-kenalog cream not helping much . Told pcp Dr Chauncy Passy told pt to show Dr [name].
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
## 2017-09-03 NOTE — Progress Notes (Signed)
Due to previous reaction, confirmed with pharmacy, administered Rituxan at a rate increase of 100/150/200.  Patient tolerating well.
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
## 2017-09-09 ENCOUNTER — Telehealth: Payer: Self-pay

## 2017-09-09 NOTE — Telephone Encounter (Signed)
Received faxed Letter of Medical Necessity form from [facility].  They request form be completed and a copy of pt's most recent chart notes within last 6 mos.    Placed form and printed office office note (08/22/17) in Dr. Synthia Innocent box.

## 2017-09-09 NOTE — Telephone Encounter (Signed)
Filled and in Lisa's box 

## 2017-09-10 ENCOUNTER — Telehealth: Payer: Self-pay

## 2017-09-10 NOTE — Telephone Encounter (Signed)
plz notify patient. If he would still like to try for continuous glucose meter, will need to check TID before meals and at night and keep log for next 2 weeks, then drop off to review.

## 2017-09-10 NOTE — Telephone Encounter (Signed)
Copied from [place] [id number]. Topic: Inquiry >> Sep 10, 2017 10:58 AM Pricilla Handler wrote: Reason for CRM: Missy with [facility] [phone]) called stating that this patient does not qualify for a [facility], because he only checks his glucose 3 times a day. To qualify, Missy states that the patient must check is glucose at lease 4 times a day. Please call Missy at [phone].       Thank You!!!

## 2017-09-10 NOTE — Telephone Encounter (Signed)
Faxed form.

## 2017-09-11 NOTE — Telephone Encounter (Addendum)
Spoke with pt relaying Dr. Synthia Innocent message.  Says he will talk with Dr. Darnell Level about it at [facility] tomorrow.  FYI to Dr. Darnell Level.

## 2017-09-12 ENCOUNTER — Encounter: Payer: Self-pay | Admitting: Family Medicine

## 2017-09-12 ENCOUNTER — Ambulatory Visit (INDEPENDENT_AMBULATORY_CARE_PROVIDER_SITE_OTHER): Payer: Medicare Other | Admitting: Family Medicine

## 2017-09-12 DIAGNOSIS — C9190 Lymphoid leukemia, unspecified not having achieved remission: Secondary | ICD-10-CM | POA: Diagnosis not present

## 2017-09-12 DIAGNOSIS — E1121 Type 2 diabetes mellitus with diabetic nephropathy: Secondary | ICD-10-CM

## 2017-09-12 DIAGNOSIS — E1165 Type 2 diabetes mellitus with hyperglycemia: Secondary | ICD-10-CM

## 2017-09-12 DIAGNOSIS — T148XXA Other injury of unspecified body region, initial encounter: Secondary | ICD-10-CM

## 2017-09-12 DIAGNOSIS — S2220XA Unspecified fracture of sternum, initial encounter for closed fracture: Secondary | ICD-10-CM

## 2017-09-12 DIAGNOSIS — I2510 Atherosclerotic heart disease of native coronary artery without angina pectoris: Secondary | ICD-10-CM

## 2017-09-12 DIAGNOSIS — D693 Immune thrombocytopenic purpura: Secondary | ICD-10-CM | POA: Diagnosis not present

## 2017-09-12 DIAGNOSIS — E1142 Type 2 diabetes mellitus with diabetic polyneuropathy: Secondary | ICD-10-CM | POA: Diagnosis not present

## 2017-09-12 DIAGNOSIS — IMO0002 Reserved for concepts with insufficient information to code with codable children: Secondary | ICD-10-CM

## 2017-09-12 DIAGNOSIS — C9110 Chronic lymphocytic leukemia of B-cell type not having achieved remission: Secondary | ICD-10-CM

## 2017-09-12 MED ORDER — GLUCOSE BLOOD VI STRP: 1.0000 | ORAL_STRIP | Freq: Four times a day (QID) | 3 refills | Status: DC
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
## 2017-09-13 DIAGNOSIS — S2220XA Unspecified fracture of sternum, initial encounter for closed fracture: Secondary | ICD-10-CM | POA: Insufficient documentation

## 2017-09-13 DIAGNOSIS — T148XXA Other injury of unspecified body region, initial encounter: Secondary | ICD-10-CM | POA: Insufficient documentation

## 2017-09-13 HISTORY — DX: Unspecified fracture of sternum, initial encounter for closed fracture: S22.20XA
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
## 2017-09-13 NOTE — Assessment & Plan Note (Signed)
Large wound L forearm - recovering wonderfully without signs of infection. Leg abrasions also healing well.

## 2017-09-13 NOTE — Assessment & Plan Note (Signed)
Stable period, in setting of CLL. Receiving rituximab through heme - appreciate care of Dr Maryjane Hurter.

## 2017-09-13 NOTE — Assessment & Plan Note (Signed)
Chronic. Latest A1c checked during episode of marked anemia in hospital (hemoglobin was 5) - anticipate this was unreliable. Will reassess at f/u visit.  Reviewed continuous glucose monitoring eligibility criteria - I have asked him to check sugars QID AC and HS for the next several weeks and bring log to review - given fluctuating sugars and # of times he injects insulin, this is very reasonable. Will resubmit request at that time.  One-touch test strips refilled.

## 2017-09-18 DIAGNOSIS — H401132 Primary open-angle glaucoma, bilateral, moderate stage: Secondary | ICD-10-CM | POA: Diagnosis not present

## 2017-09-24 ENCOUNTER — Encounter: Payer: Self-pay | Admitting: Oncology

## 2017-09-25 NOTE — Telephone Encounter (Signed)
Megan with [employer] calling to notify Dr. Darnell Level he needs to make an adendum to the chart notes staying the patient check his glucose a minimum of 4x a day in order for Medicare to cover the [facility]. Please addend and re-fax this to to [phone].

## 2017-09-26 NOTE — Telephone Encounter (Signed)
I asked patient to bring log of sugars to review later this month, when I review I will addend office note.

## 2017-09-29 NOTE — Telephone Encounter (Signed)
Noted.  Attempted to return call to [facility] with [place] no answer.  Will try again later.  Need to relay Dr. Synthia Innocent message.

## 2017-10-01 NOTE — Telephone Encounter (Signed)
Spoke with Marylyn Ishihara of [employer] relaying Dr. Synthia Innocent message.  Says he will make a note pt's info will be faxed after next OV.

## 2017-10-03 DIAGNOSIS — D693 Immune thrombocytopenic purpura: Secondary | ICD-10-CM | POA: Diagnosis not present

## 2017-10-03 DIAGNOSIS — C9190 Lymphoid leukemia, unspecified not having achieved remission: Secondary | ICD-10-CM | POA: Diagnosis not present

## 2017-10-03 DIAGNOSIS — I129 Hypertensive chronic kidney disease with stage 1 through stage 4 chronic kidney disease, or unspecified chronic kidney disease: Secondary | ICD-10-CM | POA: Diagnosis not present

## 2017-10-03 DIAGNOSIS — E1122 Type 2 diabetes mellitus with diabetic chronic kidney disease: Secondary | ICD-10-CM | POA: Diagnosis not present

## 2017-10-03 DIAGNOSIS — N183 Chronic kidney disease, stage 3 (moderate): Secondary | ICD-10-CM | POA: Diagnosis not present
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
## 2017-10-08 ENCOUNTER — Encounter: Payer: Self-pay | Admitting: Oncology

## 2017-10-08 ENCOUNTER — Inpatient Hospital Stay: Payer: Medicare Other | Attending: Oncology

## 2017-10-08 ENCOUNTER — Other Ambulatory Visit: Payer: Self-pay

## 2017-10-08 ENCOUNTER — Inpatient Hospital Stay (HOSPITAL_BASED_OUTPATIENT_CLINIC_OR_DEPARTMENT_OTHER): Payer: Medicare Other | Admitting: Oncology

## 2017-10-08 VITALS — BP 155/75 | HR 60 | Temp 97.1°F | Resp 20 | Wt 159.2 lb

## 2017-10-08 DIAGNOSIS — C9110 Chronic lymphocytic leukemia of B-cell type not having achieved remission: Secondary | ICD-10-CM | POA: Diagnosis not present

## 2017-10-08 DIAGNOSIS — I129 Hypertensive chronic kidney disease with stage 1 through stage 4 chronic kidney disease, or unspecified chronic kidney disease: Secondary | ICD-10-CM | POA: Diagnosis not present

## 2017-10-08 DIAGNOSIS — Z87891 Personal history of nicotine dependence: Secondary | ICD-10-CM | POA: Insufficient documentation

## 2017-10-08 DIAGNOSIS — I10 Essential (primary) hypertension: Secondary | ICD-10-CM | POA: Insufficient documentation

## 2017-10-08 DIAGNOSIS — E119 Type 2 diabetes mellitus without complications: Secondary | ICD-10-CM

## 2017-10-08 DIAGNOSIS — D696 Thrombocytopenia, unspecified: Secondary | ICD-10-CM | POA: Diagnosis not present

## 2017-10-08 DIAGNOSIS — D693 Immune thrombocytopenic purpura: Secondary | ICD-10-CM

## 2017-10-08 DIAGNOSIS — I2510 Atherosclerotic heart disease of native coronary artery without angina pectoris: Secondary | ICD-10-CM

## 2017-10-08 DIAGNOSIS — E039 Hypothyroidism, unspecified: Secondary | ICD-10-CM

## 2017-10-08 DIAGNOSIS — D649 Anemia, unspecified: Secondary | ICD-10-CM | POA: Insufficient documentation

## 2017-10-08 DIAGNOSIS — N183 Chronic kidney disease, stage 3 (moderate): Secondary | ICD-10-CM

## 2017-10-08 LAB — CBC WITH DIFFERENTIAL/PLATELET
BASOS PCT: 1 %
Basophils Absolute: 0 10*3/uL (ref 0–0.1)
Eosinophils Absolute: 0.2 10*3/uL (ref 0–0.7)
Eosinophils Relative: 8 %
HCT: 33.7 % — ABNORMAL LOW (ref 40.0–52.0)
Hemoglobin: 11.1 g/dL — ABNORMAL LOW (ref 13.0–18.0)
Lymphocytes Relative: 48 %
Lymphs Abs: 1.3 10*3/uL (ref 1.0–3.6)
MCH: 30.8 pg (ref 26.0–34.0)
MCHC: 33 g/dL (ref 32.0–36.0)
MCV: 93.2 fL (ref 80.0–100.0)
MONOS PCT: 7 %
Monocytes Absolute: 0.2 10*3/uL (ref 0.2–1.0)
NEUTROS PCT: 36 %
Neutro Abs: 1 10*3/uL — ABNORMAL LOW (ref 1.4–6.5)
Platelets: 43 10*3/uL — ABNORMAL LOW (ref 150–440)
RBC: 3.62 MIL/uL — ABNORMAL LOW (ref 4.40–5.90)
RDW: 14.6 % — ABNORMAL HIGH (ref 11.5–14.5)
WBC: 2.7 10*3/uL — ABNORMAL LOW (ref 3.8–10.6)

## 2017-10-08 NOTE — Progress Notes (Signed)
Patient denies any concerns today.  

## 2017-10-09 ENCOUNTER — Telehealth: Payer: Self-pay

## 2017-10-09 NOTE — Telephone Encounter (Signed)
Pt said he cannot get glucose test strips; I spoke with Jasmine at [facility] [address]. Pt has available refills but they need copy of new medicare card. Pt voiced understanding and will go by [facility] [address] now with medicare card. Pt also wants refills on lisinopril and furosemide. Advised to ck with [facility] and if refills needed to ck with Dr Ena Dawley. Pt voiced understanding.

## 2017-10-10 ENCOUNTER — Other Ambulatory Visit: Payer: Self-pay

## 2017-10-10 MED ORDER — FUROSEMIDE 20 MG PO TABS: 20.0000 mg | ORAL_TABLET | Freq: Every day | ORAL | 4 refills | Status: DC

## 2017-10-17 ENCOUNTER — Telehealth: Payer: Self-pay | Admitting: *Deleted

## 2017-10-17 NOTE — Telephone Encounter (Signed)
Patient called and states he need to verify some things regarding the biopsy that is scheduled for 8/21. Please return his call [phone]

## 2017-10-17 NOTE — Telephone Encounter (Signed)
Left vm for patient to return call 

## 2017-10-20 ENCOUNTER — Other Ambulatory Visit: Payer: Medicare Other

## 2017-10-20 ENCOUNTER — Ambulatory Visit: Payer: Medicare Other | Admitting: Oncology

## 2017-10-21 ENCOUNTER — Other Ambulatory Visit: Payer: Self-pay | Admitting: Student

## 2017-10-21 ENCOUNTER — Encounter: Payer: Self-pay | Admitting: Family Medicine

## 2017-10-21 ENCOUNTER — Ambulatory Visit (INDEPENDENT_AMBULATORY_CARE_PROVIDER_SITE_OTHER): Payer: Medicare Other | Admitting: Family Medicine

## 2017-10-21 VITALS — BP 138/72 | HR 56 | Temp 97.8°F | Ht 71.0 in | Wt 156.0 lb

## 2017-10-21 DIAGNOSIS — E1121 Type 2 diabetes mellitus with diabetic nephropathy: Secondary | ICD-10-CM

## 2017-10-21 DIAGNOSIS — I2510 Atherosclerotic heart disease of native coronary artery without angina pectoris: Secondary | ICD-10-CM | POA: Diagnosis not present

## 2017-10-21 DIAGNOSIS — C9190 Lymphoid leukemia, unspecified not having achieved remission: Secondary | ICD-10-CM | POA: Diagnosis not present

## 2017-10-21 DIAGNOSIS — I10 Essential (primary) hypertension: Secondary | ICD-10-CM | POA: Diagnosis not present

## 2017-10-21 DIAGNOSIS — IMO0002 Reserved for concepts with insufficient information to code with codable children: Secondary | ICD-10-CM

## 2017-10-21 DIAGNOSIS — S2220XD Unspecified fracture of sternum, subsequent encounter for fracture with routine healing: Secondary | ICD-10-CM | POA: Diagnosis not present

## 2017-10-21 DIAGNOSIS — C9110 Chronic lymphocytic leukemia of B-cell type not having achieved remission: Secondary | ICD-10-CM

## 2017-10-21 DIAGNOSIS — D693 Immune thrombocytopenic purpura: Secondary | ICD-10-CM

## 2017-10-21 DIAGNOSIS — E1165 Type 2 diabetes mellitus with hyperglycemia: Secondary | ICD-10-CM | POA: Diagnosis not present

## 2017-10-21 LAB — POCT GLYCOSYLATED HEMOGLOBIN (HGB A1C): HEMOGLOBIN A1C: 9.4 % — AB (ref 4.0–5.6)

## 2017-10-21 MED ORDER — TRAZODONE HCL 50 MG PO TABS: 25.0000 mg | ORAL_TABLET | Freq: Every evening | ORAL | 1 refills | Status: DC | PRN

## 2017-10-21 MED ORDER — LISINOPRIL 10 MG PO TABS: 10.0000 mg | ORAL_TABLET | Freq: Every day | ORAL | 1 refills | Status: DC
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
## 2017-10-21 NOTE — Assessment & Plan Note (Signed)
Chronic, stable readings. Goal BP <130/80 given CKD. He has not been taking lisinopril - has been unable to get refill from cardiology office. I will refill today.
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
## 2017-10-21 NOTE — Assessment & Plan Note (Signed)
Worsening. Has bone marrow biopsy scheduled for tomorrow.

## 2017-10-21 NOTE — Assessment & Plan Note (Signed)
Chronic, brittle diabetes based on sugar log he brings. No med changes today.  I have provided him with a printed log to start monitoring more scheduled readings and to bring back short term. Discussed CGM and if he decides to proceed with this, will need to check sugars QID and record readings on new sugar log to review.

## 2017-10-21 NOTE — Assessment & Plan Note (Signed)
Has recovered remarkably well. No apparent residual sequelae. Release letter written for his attorney per patient request.

## 2017-10-21 NOTE — Assessment & Plan Note (Signed)
Persistent pancytopenia. On rituxan. Has bone marrow biopsy scheduled for tomorrow.

## 2017-10-22 ENCOUNTER — Ambulatory Visit: Payer: Medicare Other | Admitting: Family Medicine

## 2017-10-22 ENCOUNTER — Ambulatory Visit: Payer: Medicare Other

## 2017-10-22 ENCOUNTER — Ambulatory Visit: Admission: RE | Admit: 2017-10-22 | Payer: Medicare Other | Source: Ambulatory Visit

## 2017-10-23 ENCOUNTER — Other Ambulatory Visit (HOSPITAL_COMMUNITY)
Admission: RE | Admit: 2017-10-23 | Disposition: A | Discharge status: Home / Self Care | Payer: Medicare Other | Source: Ambulatory Visit | Attending: Oncology | Admitting: Oncology

## 2017-10-23 ENCOUNTER — Ambulatory Visit
Admission: RE | Admit: 2017-10-23 | Discharge: 2017-10-23 | Disposition: A | Discharge status: Home / Self Care | Payer: Medicare Other | Source: Ambulatory Visit | Attending: Oncology | Admitting: Oncology

## 2017-10-23 DIAGNOSIS — E1122 Type 2 diabetes mellitus with diabetic chronic kidney disease: Secondary | ICD-10-CM | POA: Insufficient documentation

## 2017-10-23 DIAGNOSIS — D61818 Other pancytopenia: Secondary | ICD-10-CM | POA: Diagnosis not present

## 2017-10-23 DIAGNOSIS — N189 Chronic kidney disease, unspecified: Secondary | ICD-10-CM | POA: Insufficient documentation

## 2017-10-23 DIAGNOSIS — D693 Immune thrombocytopenic purpura: Secondary | ICD-10-CM | POA: Diagnosis not present

## 2017-10-23 DIAGNOSIS — Z794 Long term (current) use of insulin: Secondary | ICD-10-CM | POA: Insufficient documentation

## 2017-10-23 DIAGNOSIS — C9110 Chronic lymphocytic leukemia of B-cell type not having achieved remission: Secondary | ICD-10-CM | POA: Diagnosis not present

## 2017-10-23 LAB — CBC WITH DIFFERENTIAL/PLATELET
BASOS ABS: 0 10*3/uL (ref 0–0.1)
Basophils Relative: 1 %
EOS ABS: 0.2 10*3/uL (ref 0–0.7)
Eosinophils Relative: 6 %
HCT: 34.2 % — ABNORMAL LOW (ref 40.0–52.0)
Hemoglobin: 11.4 g/dL — ABNORMAL LOW (ref 13.0–18.0)
LYMPHS ABS: 1.7 10*3/uL (ref 1.0–3.6)
Lymphocytes Relative: 49 %
MCH: 30.3 pg (ref 26.0–34.0)
MCHC: 33.3 g/dL (ref 32.0–36.0)
MCV: 91 fL (ref 80.0–100.0)
MONO ABS: 0.2 10*3/uL (ref 0.2–1.0)
MONOS PCT: 7 %
NEUTROS ABS: 1.3 10*3/uL — AB (ref 1.4–6.5)
Neutrophils Relative %: 37 %
PLATELETS: 58 10*3/uL — AB (ref 150–440)
RBC: 3.76 MIL/uL — AB (ref 4.40–5.90)
RDW: 13.9 % (ref 11.5–14.5)
WBC: 3.4 10*3/uL — AB (ref 3.8–10.6)

## 2017-10-23 LAB — GLUCOSE, CAPILLARY: GLUCOSE-CAPILLARY: 195 mg/dL — AB (ref 70–99)

## 2017-10-23 LAB — PROTIME-INR
INR: 1.06
PROTHROMBIN TIME: 13.7 s (ref 11.4–15.2)

## 2017-10-23 LAB — APTT: APTT: 35 s (ref 24–36)

## 2017-10-23 MED ORDER — HEPARIN SOD (PORK) LOCK FLUSH 100 UNIT/ML IV SOLN
INTRAVENOUS | Status: AC
  Filled 2017-10-23: qty 5

## 2017-10-23 MED ORDER — HYDROCODONE-ACETAMINOPHEN 5-325 MG PO TABS
1.0000 | ORAL_TABLET | ORAL | Status: DC | PRN
  Administered 2017-10-23: 1 via ORAL
  Filled 2017-10-23: qty 2

## 2017-10-23 MED ORDER — MIDAZOLAM HCL 2 MG/2ML IJ SOLN
INTRAMUSCULAR | Status: AC
  Filled 2017-10-23: qty 2

## 2017-10-23 MED ORDER — HYDROCODONE-ACETAMINOPHEN 5-325 MG PO TABS
ORAL_TABLET | ORAL | Status: AC
  Filled 2017-10-23: qty 1

## 2017-10-23 MED ORDER — FENTANYL CITRATE (PF) 100 MCG/2ML IJ SOLN
INTRAMUSCULAR | Status: AC | PRN
  Administered 2017-10-23 (×2): 25 ug via INTRAVENOUS

## 2017-10-23 MED ORDER — BUPIVACAINE HCL (PF) 0.25 % IJ SOLN
INTRAMUSCULAR | Status: AC
  Filled 2017-10-23: qty 30

## 2017-10-23 MED ORDER — FENTANYL CITRATE (PF) 100 MCG/2ML IJ SOLN
INTRAMUSCULAR | Status: AC
  Filled 2017-10-23: qty 2

## 2017-10-23 MED ORDER — SODIUM CHLORIDE 0.9 % IV SOLN
INTRAVENOUS | Status: DC
  Administered 2017-10-23: 08:00:00 via INTRAVENOUS

## 2017-10-23 NOTE — Procedures (Signed)
Bone marrow biopsy under CT guidance without difficulty  Complications:  None  Blood Loss: none  See dictation in canopy pacs

## 2017-10-23 NOTE — Discharge Instructions (Signed)
Bone Marrow Aspiration and Bone Marrow Biopsy, Adult, Care After °This sheet gives you information about how to care for yourself after your procedure. Your health care provider may also give you more specific instructions. If you have problems or questions, contact your health care provider. °What can I expect after the procedure? °After the procedure, it is common to have: °· Mild pain and tenderness. °· Swelling. °· Bruising. ° °Follow these instructions at home: °· Take over-the-counter or prescription medicines only as told by your health care provider. °· Do not take baths, swim, or use a hot tub until your health care provider approves. Ask if you can take a shower or have a sponge bath. °· Follow instructions from your health care provider about how to take care of the puncture site. Make sure you: °? Wash your hands with soap and water before you change your bandage (dressing). If soap and water are not available, use hand sanitizer. °? Change your dressing as told by your health care provider. °· Check your puncture site every day for signs of infection. Check for: °? More redness, swelling, or pain. °? More fluid or blood. °? Warmth. °? Pus or a bad smell. °· Return to your normal activities as told by your health care provider. Ask your health care provider what activities are safe for you. °· Do not drive for 24 hours if you were given a medicine to help you relax (sedative). °· Keep all follow-up visits as told by your health care provider. This is important. °Contact a health care provider if: °· You have more redness, swelling, or pain around the puncture site. °· You have more fluid or blood coming from the puncture site. °· Your puncture site feels warm to the touch. °· You have pus or a bad smell coming from the puncture site. °· You have a fever. °· Your pain is not controlled with medicine. °This information is not intended to replace advice given to you by your health care provider. Make sure  you discuss any questions you have with your health care provider. °Document Released: 09/07/2004 Document Revised: 09/08/2015 Document Reviewed: 08/02/2015 °Elsevier Interactive Patient Education © 2018 [employer] ° °

## 2017-10-23 NOTE — OR Nursing (Signed)
Pt reports burning. Dr Golden Circle notified. Cleansed around dressing with aloe wipes. buring improved

## 2017-10-24 DIAGNOSIS — D61818 Other pancytopenia: Secondary | ICD-10-CM | POA: Diagnosis not present

## 2017-10-24 DIAGNOSIS — C9110 Chronic lymphocytic leukemia of B-cell type not having achieved remission: Secondary | ICD-10-CM | POA: Diagnosis not present

## 2017-10-26 NOTE — Progress Notes (Signed)
[place]  [phone] [phone]  ID: Scott [name] OB: 11-05-1940  MR#: [id number]  EMV#:[id number]  Patient Care Team: [name], MD as PCP - General (Family Medicine) Lloyd Huger, MD as Consulting Physician (Oncology) Clent Jacks, MD as Consulting Physician (Ophthalmology) Dorothy Spark, MD as Consulting Physician (Cardiology) Madelon Lips, MD as Consulting Physician (Nephrology)  CHIEF COMPLAINT: CLL.  INTERVAL HISTORY: Patient returns to clinic today for repeat laboratory, further evaluation, and discussion of his bone marrow biopsy results.  He currently feels well and is asymptomatic.  He has no neurologic complaints.  He has a good appetite and denies weight loss. He denies any night sweats. He has noted no lymphadenopathy. He has no chest pain or shortness of breath. He denies any nausea, vomiting, constipation, or diarrhea. He has no urinary complaints.  Patient feels at his baseline offers no specific complaints today.  REVIEW OF SYSTEMS:   Review of Systems  Constitutional: Negative.  Negative for fever, malaise/fatigue and weight loss.  Respiratory: Negative.  Negative for cough and shortness of breath.   Cardiovascular: Negative.  Negative for chest pain and leg swelling.  Gastrointestinal: Negative.  Negative for abdominal pain and blood in stool.  Genitourinary: Negative.  Negative for dysuria and hematuria.  Musculoskeletal: Negative.  Negative for back pain and neck pain.  Skin: Negative.  Negative for itching and rash.  Neurological: Negative.  Negative for sensory change, focal weakness, weakness and headaches.  Psychiatric/Behavioral: Negative.  The patient is not nervous/anxious.     As per HPI. Otherwise, a complete review of systems is negative.  PAST MEDICAL HISTORY: Past Medical History:  Diagnosis Date  . Acquired hand deformity 1962   hand saw accident at work  . Anemia 10/11/2011  .  Arthritis   . Bradycardia 10/10/2011  . CAD (coronary artery disease) 10/10/2011   MI s/p PTCA (Dx-OM2 proximal concentric stenosis)  . CKD (chronic kidney disease) stage 3, GFR 30-59 ml/min (HCC)    [name]  . CLL (chronic lymphocytic leukemia) ([place])   . Colon polyps   . Generalized headaches    frequent  . Glaucoma    s/p laser surgery  . HLD (hyperlipidemia)   . Hypertension   . Hypothyroidism 10/10/2011  . Thrombocytopenia ([place]) 10/11/2011  . Type 2 diabetes with nephropathy [facility])    DM refresher course [facility] (04/2013)    PAST SURGICAL HISTORY: Past Surgical History:  Procedure Laterality Date  . BACK SURGERY     cervical neck  . CATARACT EXTRACTION, BILATERAL Bilateral 2017  . COLONOSCOPY  11/2008   1 polyp, diverticulosis, rec rpt 5 yrs (Dr. Oletta Lamas, [name])  . COLONOSCOPY  06/2014   hyperplastic polyp, rpt 5 yrs ([place])  . EYE SURGERY  2012   laser surgery for glaucoma  . PTCA  1994, 1995  . US ECHOCARDIOGRAPHY  10/2013   inferior wall hypokinesis, mild LVH, EF 50-55%, mild MR and LA dilation    FAMILY HISTORY: Family History  Problem Relation Age of Onset  . Diabetes Sister   . Stomach cancer Sister 1  . Pneumonia Father        caused death  . Other Brother        no communication with brother so unsure of any health conditions  . Coronary artery disease Son 28       5v CABG and stents  . Hyperlipidemia Sister   . Stroke Neg Hx   . Heart attack Neg Hx  ADVANCED DIRECTIVES (Y/N):  N  HEALTH MAINTENANCE: Social History   Tobacco Use  . Smoking status: Former Smoker    Last attempt to quit: 03/04/1978    Years since quitting: 39.6  . Smokeless tobacco: Never Used  Substance Use Topics  . Alcohol use: No  . Drug use: No     Colonoscopy:  PAP:  Bone density:  Lipid panel:  No Known Allergies  Current Outpatient Medications  Medication Sig Dispense Refill  . acetaminophen (TYLENOL) 650 MG CR tablet Take 650 mg by mouth every 8 (eight) hours as  needed for pain.    Marland Kitchen aspirin EC 81 MG tablet Take 1 tablet (81 mg total) by mouth daily. 90 tablet 3  . atorvastatin (LIPITOR) 80 MG tablet TAKE 1 TABLET BY MOUTH ONCE DAILY 90 tablet 3  . carvedilol (COREG) 6.25 MG tablet TAKE 1 TABLET BY MOUTH TWICE DAILY 180 tablet 3  . Continuous Blood Gluc Receiver (FREESTYLE LIBRE 14 DAY READER) DEVI 1 Device by Does not apply route as directed. 1 Device 0  . Continuous Blood Gluc Sensor (FREESTYLE LIBRE 14 DAY SENSOR) MISC 1 Device by Does not apply route every 14 (fourteen) days. 6 each 3  . furosemide (LASIX) 20 MG tablet Take 1 tablet (20 mg total) by mouth daily. 30 tablet 4  . glucose blood (ONE TOUCH ULTRA TEST) test strip 1 each by Other route 4 (four) times daily. and as needed to check sugars Dx code:  E11.21 400 each 3  . insulin aspart (NOVOLOG) 100 UNIT/ML FlexPen Inject 5 Units into the skin 3 (three) times daily with meals. (3-7 units) 15 mL 1  . Insulin Detemir (LEVEMIR FLEXTOUCH) 100 UNIT/ML Pen Inject 25 Units into the skin daily at 10 pm. 15 pen 3  . levothyroxine (SYNTHROID, LEVOTHROID) 50 MCG tablet TAKE 1 TABLET BY MOUTH ONCE DAILY 90 tablet 2  . niacin (SLO-NIACIN) 500 MG tablet Take 1,000 mg by mouth at bedtime.     . nitroGLYCERIN (NITROSTAT) 0.4 MG SL tablet Place 1 tablet (0.4 mg total) under the tongue every 5 (five) minutes as needed for chest pain ((MAX of 3 doses)). 25 tablet 0  . oxyCODONE-acetaminophen (PERCOCET/ROXICET) 5-325 MG tablet Take 1 tablet by mouth every 4 (four) hours as needed for severe pain. 30 tablet 0  . pantoprazole (PROTONIX) 40 MG tablet Take 1 tablet (40 mg total) by mouth daily. 30 tablet 0  . traZODone (DESYREL) 50 MG tablet Take 0.5-1 tablets (25-50 mg total) by mouth at bedtime as needed for sleep. 90 tablet 1  . triamcinolone cream (KENALOG) 0.1 % Apply 1 application topically 2 (two) times daily. Apply to AA. 45 g 0  . vitamin B-12 1000 MCG tablet Take 1 tablet (1,000 mcg total) by mouth daily. 120  tablet 0  . lisinopril (PRINIVIL,ZESTRIL) 10 MG tablet Take 1 tablet (10 mg total) by mouth daily. 90 tablet 1   No current facility-administered medications for this visit.     OBJECTIVE: Vitals:   10/30/17 1014 10/30/17 1018  BP:  (!) 150/70  Pulse:  (!) 51  Resp: 12   Temp:  98.6 F (37 C)     Body mass index is 21.72 kg/m.    ECOG FS:0 - Asymptomatic  General: Well-developed, well-nourished, no acute distress. Eyes: Pink conjunctiva, anicteric sclera. HEENT: Normocephalic, moist mucous membranes. Lungs: Clear to auscultation bilaterally. Heart: Regular rate and rhythm. No rubs, murmurs, or gallops. Abdomen: Soft, nontender, nondistended. No organomegaly noted, normoactive  bowel sounds. Musculoskeletal: No edema, cyanosis, or clubbing. Neuro: Alert, answering all questions appropriately. Cranial nerves grossly intact. Skin: No rashes or petechiae noted. Psych: Normal affect. Lymphatics: No cervical, calvicular, axillary or inguinal LAD.  LAB RESULTS:  Lab Results  Component Value Date   NA 140 08/23/2017   K 4.8 08/23/2017   CL 105 08/23/2017   CO2 24 08/23/2017   GLUCOSE 324 (H) 08/23/2017   BUN 33 (H) 08/23/2017   CREATININE 1.75 (H) 08/23/2017   CALCIUM 9.5 08/23/2017   PROT 6.3 (L) 08/23/2017   ALBUMIN 3.8 08/23/2017   AST 15 08/23/2017   ALT 14 (L) 08/23/2017   ALKPHOS 65 08/23/2017   BILITOT 0.8 08/23/2017   GFRNONAA 36 (L) 08/23/2017   GFRAA 42 (L) 08/23/2017    Lab Results  Component Value Date   WBC 3.4 (L) 2017-11-19   NEUTROABS 1.3 (L) 11/19/17   HGB 11.4 (L) 11-19-2017   HCT 34.2 (L) 11/19/17   MCV 91.0 November 19, 2017   PLT 58 (L) 11/19/2017     STUDIES: Ct Biopsy  Result Date: 11/19/17 INDICATION: ITP EXAM: CT-GUIDED BONE MARROW ASPIRATION AND BIOPSY MEDICATIONS: None. ANESTHESIA/SEDATION: Moderate (conscious) sedation was employed during this procedure. Fentanyl 50 mcg was administered intravenously. Moderate Sedation Time: 15  minutes. The patient's level of consciousness and vital signs were monitored continuously by radiology nursing throughout the procedure under my direct supervision. FLUOROSCOPY TIME:  Not applicable COMPLICATIONS: None immediate. PROCEDURE: Informed written consent was obtained from the patient after a thorough discussion of the procedural risks, benefits and alternatives. All questions were addressed. Maximal Sterile Barrier Technique was utilized including caps, mask, sterile gowns, sterile gloves, sterile drape, hand hygiene and skin antiseptic. A timeout was performed prior to the initiation of the procedure. Utilizing 0.25% Marcaine as a local and deep periosteal anesthetic, an Arrow Oncontrol biopsy needle was placed into the left iliac bone adjacent to the sacroiliac joint. Initial aspiration biopsy was performed and deemed adequate by pathology. Subsequently two 1 cm cores were obtained from the iliac bone without difficulty. The needle was removed and hemostasis obtained at the puncture site. The patient tolerated the procedure well and returned his room in satisfactory condition. IMPRESSION: Successful CT-guided bone marrow biopsy as described. Electronically Signed   By: Inez Catalina M.D.   On: 11-19-2017 10:00    ASSESSMENT: CLL, Rai stage 0, anemia, ITP.  PLAN:    1. CLL: Bone marrow biopsy completed on 2017-11-19 reviewed independently with involvement with CLL, unclear percentage of involvement.  Cytogenetics are pending at time of dictation.  Patient's white blood cell count is now mildly decreased.  He also continues to have mild anemia and thrombocytopenia. He received his last infusion of single agent Rituxan on September 03, 2017.  No further intervention is needed.  If patient requires additional treatment, may have to choose a more aggressive regimen such as Treanda plus Rituxan.  Return to clinic in 4 weeks with repeat laboratory work and further evaluation. 2.  Thrombocytopenia:  Patient's platelets are decreased, but trending up. 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  Patient previously received high-dose prednisone and IVIG, but these treatments were not durable.   3.  Anemia: Hemoglobin decreased, but slightly improved to 11.4.  He was noted to have heme positive stools while in the hospital and will require luminal evaluation in the near future.   4.  Reaction to Rituxan: Rate-based.  Patient will require premedications for any further infusions with  Rituxan.    I spent a total of 30 minutes face-to-face with the patient of which greater than 50% of the visit was spent in counseling and coordination of care as detailed above.    Patient expressed understanding and was in agreement with this plan. He also understands that He can call clinic at any time with any questions, concerns, or complaints.    Lloyd Huger, MD   11/03/2017 7:27 AM

## 2017-10-30 ENCOUNTER — Other Ambulatory Visit: Payer: Self-pay

## 2017-10-30 ENCOUNTER — Inpatient Hospital Stay (HOSPITAL_BASED_OUTPATIENT_CLINIC_OR_DEPARTMENT_OTHER): Payer: Medicare Other | Admitting: Oncology

## 2017-10-30 ENCOUNTER — Encounter: Payer: Self-pay | Admitting: Oncology

## 2017-10-30 VITALS — BP 150/70 | HR 51 | Temp 98.6°F | Resp 12 | Ht 71.0 in | Wt 155.7 lb

## 2017-10-30 DIAGNOSIS — I2510 Atherosclerotic heart disease of native coronary artery without angina pectoris: Secondary | ICD-10-CM

## 2017-10-30 DIAGNOSIS — D696 Thrombocytopenia, unspecified: Secondary | ICD-10-CM

## 2017-10-30 DIAGNOSIS — C9110 Chronic lymphocytic leukemia of B-cell type not having achieved remission: Secondary | ICD-10-CM | POA: Diagnosis not present

## 2017-10-30 DIAGNOSIS — I10 Essential (primary) hypertension: Secondary | ICD-10-CM | POA: Diagnosis not present

## 2017-10-30 DIAGNOSIS — D693 Immune thrombocytopenic purpura: Secondary | ICD-10-CM

## 2017-10-30 DIAGNOSIS — E119 Type 2 diabetes mellitus without complications: Secondary | ICD-10-CM

## 2017-10-30 DIAGNOSIS — Z87891 Personal history of nicotine dependence: Secondary | ICD-10-CM

## 2017-10-30 DIAGNOSIS — D649 Anemia, unspecified: Secondary | ICD-10-CM | POA: Diagnosis not present

## 2017-10-30 NOTE — Progress Notes (Signed)
Patient here for follow up. No changes since last appt. 

## 2017-11-05 ENCOUNTER — Encounter (HOSPITAL_COMMUNITY): Payer: Self-pay | Admitting: Oncology

## 2017-11-17 ENCOUNTER — Ambulatory Visit: Payer: Medicare Other | Admitting: Certified Registered Nurse Anesthetist

## 2017-11-17 ENCOUNTER — Encounter
Admission: RE | Disposition: A | Discharge status: Home / Self Care | Payer: Self-pay | Source: Ambulatory Visit | Attending: Gastroenterology

## 2017-11-17 ENCOUNTER — Encounter: Payer: Self-pay | Admitting: Certified Registered Nurse Anesthetist

## 2017-11-17 ENCOUNTER — Ambulatory Visit
Admission: RE | Admit: 2017-11-17 | Discharge: 2017-11-17 | Disposition: A | Discharge status: Home / Self Care | Payer: Medicare Other | Source: Ambulatory Visit | Attending: Gastroenterology | Admitting: Gastroenterology

## 2017-11-17 DIAGNOSIS — N183 Chronic kidney disease, stage 3 (moderate): Secondary | ICD-10-CM | POA: Diagnosis not present

## 2017-11-17 DIAGNOSIS — Z9861 Coronary angioplasty status: Secondary | ICD-10-CM | POA: Insufficient documentation

## 2017-11-17 DIAGNOSIS — K269 Duodenal ulcer, unspecified as acute or chronic, without hemorrhage or perforation: Secondary | ICD-10-CM | POA: Diagnosis not present

## 2017-11-17 DIAGNOSIS — K2950 Unspecified chronic gastritis without bleeding: Secondary | ICD-10-CM | POA: Insufficient documentation

## 2017-11-17 DIAGNOSIS — D500 Iron deficiency anemia secondary to blood loss (chronic): Secondary | ICD-10-CM | POA: Diagnosis not present

## 2017-11-17 DIAGNOSIS — H409 Unspecified glaucoma: Secondary | ICD-10-CM | POA: Insufficient documentation

## 2017-11-17 DIAGNOSIS — I2510 Atherosclerotic heart disease of native coronary artery without angina pectoris: Secondary | ICD-10-CM | POA: Insufficient documentation

## 2017-11-17 DIAGNOSIS — Z79899 Other long term (current) drug therapy: Secondary | ICD-10-CM | POA: Insufficient documentation

## 2017-11-17 DIAGNOSIS — C9110 Chronic lymphocytic leukemia of B-cell type not having achieved remission: Secondary | ICD-10-CM | POA: Diagnosis not present

## 2017-11-17 DIAGNOSIS — K449 Diaphragmatic hernia without obstruction or gangrene: Secondary | ICD-10-CM | POA: Diagnosis not present

## 2017-11-17 DIAGNOSIS — Z86010 Personal history of colonic polyps: Secondary | ICD-10-CM | POA: Insufficient documentation

## 2017-11-17 DIAGNOSIS — D509 Iron deficiency anemia, unspecified: Secondary | ICD-10-CM | POA: Insufficient documentation

## 2017-11-17 DIAGNOSIS — Z9841 Cataract extraction status, right eye: Secondary | ICD-10-CM | POA: Insufficient documentation

## 2017-11-17 DIAGNOSIS — Z9842 Cataract extraction status, left eye: Secondary | ICD-10-CM | POA: Insufficient documentation

## 2017-11-17 DIAGNOSIS — D120 Benign neoplasm of cecum: Secondary | ICD-10-CM | POA: Insufficient documentation

## 2017-11-17 DIAGNOSIS — Z794 Long term (current) use of insulin: Secondary | ICD-10-CM | POA: Insufficient documentation

## 2017-11-17 DIAGNOSIS — E039 Hypothyroidism, unspecified: Secondary | ICD-10-CM | POA: Insufficient documentation

## 2017-11-17 DIAGNOSIS — I252 Old myocardial infarction: Secondary | ICD-10-CM | POA: Insufficient documentation

## 2017-11-17 DIAGNOSIS — Z7982 Long term (current) use of aspirin: Secondary | ICD-10-CM | POA: Insufficient documentation

## 2017-11-17 DIAGNOSIS — D696 Thrombocytopenia, unspecified: Secondary | ICD-10-CM | POA: Diagnosis not present

## 2017-11-17 DIAGNOSIS — K5730 Diverticulosis of large intestine without perforation or abscess without bleeding: Secondary | ICD-10-CM | POA: Diagnosis not present

## 2017-11-17 DIAGNOSIS — K5790 Diverticulosis of intestine, part unspecified, without perforation or abscess without bleeding: Secondary | ICD-10-CM | POA: Diagnosis not present

## 2017-11-17 DIAGNOSIS — R001 Bradycardia, unspecified: Secondary | ICD-10-CM | POA: Insufficient documentation

## 2017-11-17 DIAGNOSIS — K259 Gastric ulcer, unspecified as acute or chronic, without hemorrhage or perforation: Secondary | ICD-10-CM | POA: Diagnosis not present

## 2017-11-17 DIAGNOSIS — E1122 Type 2 diabetes mellitus with diabetic chronic kidney disease: Secondary | ICD-10-CM | POA: Diagnosis not present

## 2017-11-17 DIAGNOSIS — Z833 Family history of diabetes mellitus: Secondary | ICD-10-CM | POA: Insufficient documentation

## 2017-11-17 DIAGNOSIS — K3189 Other diseases of stomach and duodenum: Secondary | ICD-10-CM

## 2017-11-17 DIAGNOSIS — Z800 Family history of malignant neoplasm of digestive organs: Secondary | ICD-10-CM | POA: Insufficient documentation

## 2017-11-17 DIAGNOSIS — D631 Anemia in chronic kidney disease: Secondary | ICD-10-CM | POA: Diagnosis not present

## 2017-11-17 DIAGNOSIS — M1990 Unspecified osteoarthritis, unspecified site: Secondary | ICD-10-CM | POA: Diagnosis not present

## 2017-11-17 DIAGNOSIS — Z87891 Personal history of nicotine dependence: Secondary | ICD-10-CM | POA: Insufficient documentation

## 2017-11-17 DIAGNOSIS — D126 Benign neoplasm of colon, unspecified: Secondary | ICD-10-CM | POA: Diagnosis not present

## 2017-11-17 DIAGNOSIS — I129 Hypertensive chronic kidney disease with stage 1 through stage 4 chronic kidney disease, or unspecified chronic kidney disease: Secondary | ICD-10-CM | POA: Insufficient documentation

## 2017-11-17 DIAGNOSIS — K279 Peptic ulcer, site unspecified, unspecified as acute or chronic, without hemorrhage or perforation: Secondary | ICD-10-CM | POA: Diagnosis not present

## 2017-11-17 DIAGNOSIS — K635 Polyp of colon: Secondary | ICD-10-CM | POA: Diagnosis not present

## 2017-11-17 DIAGNOSIS — D123 Benign neoplasm of transverse colon: Secondary | ICD-10-CM | POA: Insufficient documentation

## 2017-11-17 DIAGNOSIS — E785 Hyperlipidemia, unspecified: Secondary | ICD-10-CM | POA: Insufficient documentation

## 2017-11-17 DIAGNOSIS — K2960 Other gastritis without bleeding: Secondary | ICD-10-CM | POA: Diagnosis not present

## 2017-11-17 DIAGNOSIS — Z8249 Family history of ischemic heart disease and other diseases of the circulatory system: Secondary | ICD-10-CM | POA: Insufficient documentation

## 2017-11-17 HISTORY — PX: COLONOSCOPY WITH PROPOFOL: SHX5780

## 2017-11-17 HISTORY — PX: ESOPHAGOGASTRODUODENOSCOPY (EGD) WITH PROPOFOL: SHX5813

## 2017-11-17 LAB — GLUCOSE, CAPILLARY: Glucose-Capillary: 143 mg/dL — ABNORMAL HIGH (ref 70–99)

## 2017-11-17 SURGERY — ESOPHAGOGASTRODUODENOSCOPY (EGD) WITH PROPOFOL
Anesthesia: General

## 2017-11-17 MED ORDER — SODIUM CHLORIDE 0.9 % IV SOLN
INTRAVENOUS | Status: DC
  Administered 2017-11-17: 1000 mL via INTRAVENOUS

## 2017-11-17 MED ORDER — PROPOFOL 10 MG/ML IV BOLUS
INTRAVENOUS | Status: DC | PRN
  Administered 2017-11-17: 20 mg via INTRAVENOUS
  Administered 2017-11-17: 60 mg via INTRAVENOUS

## 2017-11-17 MED ORDER — EPHEDRINE SULFATE 50 MG/ML IJ SOLN
INTRAMUSCULAR | Status: AC
  Filled 2017-11-17: qty 1

## 2017-11-17 MED ORDER — PHENYLEPHRINE HCL 10 MG/ML IJ SOLN
INTRAMUSCULAR | Status: AC
  Filled 2017-11-17: qty 1

## 2017-11-17 MED ORDER — LIDOCAINE HCL (PF) 2 % IJ SOLN
INTRAMUSCULAR | Status: AC
  Filled 2017-11-17: qty 10

## 2017-11-17 MED ORDER — PROPOFOL 500 MG/50ML IV EMUL
INTRAVENOUS | Status: AC
  Filled 2017-11-17: qty 50

## 2017-11-17 MED ORDER — PROPOFOL 500 MG/50ML IV EMUL
INTRAVENOUS | Status: DC | PRN
  Administered 2017-11-17: 175 ug/kg/min via INTRAVENOUS

## 2017-11-17 MED ORDER — LIDOCAINE HCL (CARDIAC) PF 100 MG/5ML IV SOSY
PREFILLED_SYRINGE | INTRAVENOUS | Status: DC | PRN
  Administered 2017-11-17: 50 mg via INTRAVENOUS

## 2017-11-17 NOTE — H&P (Signed)
[name], MD [address]  [place]  [place], [place] [address]  Main: [phone]  Fax: [phone] Pager: [phone]  Primary Care Physician:  [name], MD Primary Gastroenterologist:  Dr. [name]  Pre-Procedure History & Physical: HPI:  Scott [name] is a 77 y.o. male is here for an endoscopy and colonoscopy.   Past Medical History:  Diagnosis Date  . Acquired hand deformity 1962   hand saw accident at work  . Anemia 10/11/2011  . Arthritis   . Bradycardia 10/10/2011  . CAD (coronary artery disease) 10/10/2011   MI s/p PTCA (Dx-OM2 proximal concentric stenosis)  . CKD (chronic kidney disease) stage 3, GFR 30-59 ml/min (HCC)    [name]  . CLL (chronic lymphocytic leukemia) ([place])   . Colon polyps   . Generalized headaches    frequent  . Glaucoma    s/p laser surgery  . HLD (hyperlipidemia)   . Hypertension   . Hypothyroidism 10/10/2011  . Thrombocytopenia ([place]) 10/11/2011  . Type 2 diabetes with nephropathy [facility])    DM refresher course [facility] (04/2013)    Past Surgical History:  Procedure Laterality Date  . BACK SURGERY     cervical neck  . CATARACT EXTRACTION, BILATERAL Bilateral 2017  . COLONOSCOPY  11/2008   1 polyp, diverticulosis, rec rpt 5 yrs (Dr. Oletta Lamas, [name])  . COLONOSCOPY  06/2014   hyperplastic polyp, rpt 5 yrs ([place])  . EYE SURGERY  2012   laser surgery for glaucoma  . PTCA  1994, 1995  . US ECHOCARDIOGRAPHY  10/2013   inferior wall hypokinesis, mild LVH, EF 50-55%, mild MR and LA dilation    Prior to Admission medications   Medication Sig Start Date End Date Taking? Authorizing Provider  acetaminophen (TYLENOL) 650 MG CR tablet Take 650 mg by mouth every 8 (eight) hours as needed for pain.   Yes [provider]  aspirin EC 81 MG tablet Take 1 tablet (81 mg total) by mouth daily. 02/01/16  Yes Dorothy Spark, MD  atorvastatin (LIPITOR) 80 MG tablet TAKE 1 TABLET BY MOUTH ONCE DAILY 07/16/17  Yes Dorothy Spark, MD  carvedilol (COREG) 6.25 MG tablet TAKE 1 TABLET BY MOUTH TWICE DAILY 05/08/17  Yes Dorothy Spark, MD  Continuous Blood Gluc Receiver (FREESTYLE LIBRE 14 DAY READER) DEVI 1 Device by Does not apply route as directed. 08/23/17  Yes [name], MD  Continuous Blood Gluc Sensor (FREESTYLE LIBRE 14 DAY SENSOR) MISC 1 Device by Does not apply route every 14 (fourteen) days. 08/23/17  Yes [name], MD  furosemide (LASIX) 20 MG tablet Take 1 tablet (20 mg total) by mouth daily. 10/10/17 01/08/18 Yes Dorothy Spark, MD  glucose blood (ONE TOUCH ULTRA TEST) test strip 1 each by Other route 4 (four) times daily. and as needed to check sugars Dx code:  E11.21 09/12/17  Yes [name], MD  insulin aspart (NOVOLOG) 100 UNIT/ML FlexPen Inject 5 Units into the skin 3 (three) times daily with meals. (3-7 units) 07/29/17  Yes [name], MD  Insulin Detemir (LEVEMIR FLEXTOUCH) 100 UNIT/ML Pen Inject 25 Units into the skin daily at 10 pm. 02/17/17  Yes [name], MD  levothyroxine (SYNTHROID, LEVOTHROID) 50 MCG tablet TAKE 1 TABLET BY MOUTH ONCE DAILY 05/27/17  Yes [name], MD  lisinopril (PRINIVIL,ZESTRIL) 10 MG tablet Take 1 tablet (10 mg total) by mouth daily. 10/21/17  Yes [name], MD  niacin (SLO-NIACIN) 500 MG tablet Take  1,000 mg by mouth at bedtime.    Yes [provider]  nitroGLYCERIN (NITROSTAT) 0.4 MG SL tablet Place 1 tablet (0.4 mg total) under the tongue every 5 (five) minutes as needed for chest pain ((MAX of 3 doses)). 05/06/16  Yes Dorothy Spark, MD  oxyCODONE-acetaminophen (PERCOCET/ROXICET) 5-325 MG tablet Take 1 tablet by mouth every 4 (four) hours as needed for severe pain. 09/03/17  Yes Lloyd Huger, MD  pantoprazole (PROTONIX) 40 MG tablet Take 1 tablet (40 mg total) by mouth daily. 07/15/17  Yes Bettey Costa, MD  traZODone (DESYREL) 50 MG tablet Take 0.5-1 tablets (25-50 mg total) by mouth at bedtime as needed  for sleep. 10/21/17  Yes [name], MD  triamcinolone cream (KENALOG) 0.1 % Apply 1 application topically 2 (two) times daily. Apply to [place]. 08/23/17 08/23/18 Yes [name], MD  vitamin B-12 1000 MCG tablet Take 1 tablet (1,000 mcg total) by mouth daily. 07/16/17  Yes Bettey Costa, MD    Allergies as of 08/20/2017  . (No Known Allergies)    Family History  Problem Relation Age of Onset  . Diabetes Sister   . Stomach cancer Sister 59  . Pneumonia Father        caused death  . Other Brother        no communication with brother so unsure of any health conditions  . Coronary artery disease Son 70       5v CABG and stents  . Hyperlipidemia Sister   . Stroke Neg Hx   . Heart attack Neg Hx     Social History   Socioeconomic History  . Marital status: Widowed    Spouse name: Not on file  . Number of children: Not on file  . Years of education: Not on file  . Highest education level: Not on file  Occupational History  . Not on file  Social Needs  . Financial resource strain: Not on file  . Food insecurity:    Worry: Not on file    Inability: Not on file  . Transportation needs:    Medical: Not on file    Non-medical: Not on file  Tobacco Use  . Smoking status: Former Smoker    Last attempt to quit: 03/04/1978    Years since quitting: 39.7  . Smokeless tobacco: Never Used  Substance and Sexual Activity  . Alcohol use: No  . Drug use: No  . Sexual activity: Never  Lifestyle  . Physical activity:    Days per week: Not on file    Minutes per session: Not on file  . Stress: Not on file  Relationships  . Social connections:    Talks on phone: Not on file    Gets together: Not on file    Attends religious service: Not on file    Active member of club or organization: Not on file    Attends meetings of clubs or organizations: Not on file    Relationship status: Not on file  . Intimate partner violence:    Fear of current or ex partner: Not on file     Emotionally abused: Not on file    Physically abused: Not on file    Forced sexual activity: Not on file  Other Topics Concern  . Not on file  Social History Narrative   Widower. Wife passed 08/2015 from cancer. 1 dog   Occupation: retired, was route Hotel manager   Edu: 11th gr   Activity: walks dog Ecologist)  3-4x/day, yardwork, rides bicycle.   Diet: drinks diet coke, no vegetables    Review of Systems: See HPI, otherwise negative ROS  Physical Exam: BP (!) 110/48 (BP Location: Left Arm)   Pulse 62   Temp (!) 97 F (36.1 C)   Resp 18   Ht 5\' 11"  (1.803 m)   Wt 68 kg   SpO2 100%   BMI 20.92 kg/m  General:   Alert,  pleasant and cooperative in NAD Head:  Normocephalic and atraumatic. Neck:  Supple; no masses or thyromegaly. Lungs:  Clear throughout to auscultation.    Heart:  Regular rate and rhythm. Abdomen:  Soft, nontender and nondistended. Normal bowel sounds, without guarding, and without rebound.   Neurologic:  Alert and  oriented x4;  grossly normal neurologically.  Impression/Plan: Scott [name] is here for an endoscopy and colonoscopy to be performed for iron deficiency anemia  Risks, benefits, limitations, and alternatives regarding  endoscopy and colonoscopy have been reviewed with the patient.  Questions have been answered.  All parties agreeable.   Sherri Sear, MD  11/17/2017, 9:05 AM

## 2017-11-17 NOTE — Anesthesia Procedure Notes (Signed)
Date/Time: 11/17/2017 8:18 AM Performed by: Johnna Acosta, CRNA Pre-anesthesia Checklist: Patient identified, Emergency Drugs available, Suction available, Patient being monitored and Timeout performed Patient Re-evaluated:Patient Re-evaluated prior to induction Oxygen Delivery Method: Nasal cannula Preoxygenation: Pre-oxygenation with 100% oxygen

## 2017-11-17 NOTE — Anesthesia Postprocedure Evaluation (Signed)
Anesthesia Post Note  Patient: Scott [name]  Procedure(s) Performed: ESOPHAGOGASTRODUODENOSCOPY (EGD) WITH PROPOFOL (N/A ) COLONOSCOPY WITH PROPOFOL (N/A )  Patient location during evaluation: Endoscopy Anesthesia Type: General Level of consciousness: awake and alert Pain management: pain level controlled Vital Signs Assessment: post-procedure vital signs reviewed and stable Respiratory status: spontaneous breathing, nonlabored ventilation, respiratory function stable and patient connected to nasal cannula oxygen Cardiovascular status: blood pressure returned to baseline and stable Postop Assessment: no apparent nausea or vomiting Anesthetic complications: no     Last Vitals:  Vitals:   11/17/17 0859 11/17/17 0900  BP: (!) 110/48 (!) 110/48  Pulse:  62  Resp:  18  Temp: (!) 36.1 C (!) 36.1 C  SpO2:  100%    Last Pain:  Vitals:   11/17/17 0944  TempSrc:   PainSc: 0-No pain                 Shalonda Sachse S

## 2017-11-17 NOTE — Op Note (Signed)
[facility] Gastroenterology Patient Name: Scott [name] Procedure Date: 11/17/2017 7:38 AM MRN: [id number] Account #: [id number] Date of Birth: [birth year] Admit Type: Outpatient Age: 77 Room: [facility] ENDO ROOM 2 Gender: Male Note Status: Finalized Procedure:            Upper GI endoscopy Indications:          Unexplained iron deficiency anemia Providers:            Lin Landsman MD, MD Referring MD:         [name] (Referring MD) Medicines:            Monitored Anesthesia Care Complications:        No immediate complications. Estimated blood loss:                        Minimal. Procedure:            Pre-Anesthesia Assessment:                       - Prior to the procedure, a History and Physical was                        performed, and patient medications and allergies were                        reviewed. The patient is competent. The risks and                        benefits of the procedure and the sedation options and                        risks were discussed with the patient. All questions                        were answered and informed consent was obtained.                        Patient identification and proposed procedure were                        verified by the physician, the nurse, the                        anesthesiologist, the anesthetist and the technician in                        the pre-procedure area in the procedure room in the                        endoscopy suite. Mental Status Examination: alert and                        oriented. Airway Examination: normal oropharyngeal                        airway and neck mobility. Respiratory Examination:                        clear to auscultation. CV Examination: normal.  Prophylactic Antibiotics: The patient does not require                        prophylactic antibiotics. Prior Anticoagulants: The                        patient has taken no previous  anticoagulant or                        antiplatelet agents. ASA Grade Assessment: III - A                        patient with severe systemic disease. After reviewing                        the risks and benefits, the patient was deemed in                        satisfactory condition to undergo the procedure. The                        anesthesia plan was to use monitored anesthesia care                        (MAC). Immediately prior to administration of                        medications, the patient was re-assessed for adequacy                        to receive sedatives. The heart rate, respiratory rate,                        oxygen saturations, blood pressure, adequacy of                        pulmonary ventilation, and response to care were                        monitored throughout the procedure. The physical status                        of the patient was re-assessed after the procedure.                       After obtaining informed consent, the endoscope was                        passed under direct vision. Throughout the procedure,                        the patient's blood pressure, pulse, and oxygen                        saturations were monitored continuously. The Endoscope                        was introduced through the mouth, and advanced to the  second part of duodenum. The upper GI endoscopy was                        accomplished without difficulty. The patient tolerated                        the procedure well. Findings:      Three non-bleeding superficial duodenal ulcers with a clean ulcer base       (Forrest Class III) were found in the duodenal bulb and in the second       portion of the duodenum. The largest lesion was 5 mm in largest       dimension.      Two localized, diminutive non-bleeding erosions were found in the       prepyloric region of the stomach. There were stigmata of recent bleeding.      Multiple dispersed,  diminutive non-bleeding erosions were found in the       gastric body. There were stigmata of recent bleeding. Biopsies were       taken with a cold forceps for Helicobacter pylori testing.      A small hiatal hernia was present.      The gastroesophageal junction and examined esophagus were normal. Impression:           - Multiple non-bleeding duodenal ulcers with a clean                        ulcer base (Forrest Class III).                       - Non-bleeding erosive gastropathy.                       - Non-bleeding erosive gastropathy. Biopsied.                       - Small hiatal hernia.                       - Normal gastroesophageal junction and esophagus. Recommendation:       - Await pathology results.                       - Proceed with colonoscopy as scheduled                       See colonoscopy report Procedure Code(s):    --- Professional ---                       [phone], Esophagogastroduodenoscopy, flexible, transoral;                        with biopsy, single or multiple Diagnosis Code(s):    --- Professional ---                       K26.9, Duodenal ulcer, unspecified as acute or chronic,                        without hemorrhage or perforation                       K31.89, Other diseases of stomach and duodenum  K44.9, Diaphragmatic hernia without obstruction or                        gangrene                       D50.9, Iron deficiency anemia, unspecified CPT copyright 2017 American Medical Association. All rights reserved. The codes documented in this report are preliminary and upon coder review may  be revised to meet current compliance requirements. Dr. Ulyess Mort Lin Landsman MD, MD 11/17/2017 8:29:53 AM This report has been signed electronically. Number of Addenda: 0 Note Initiated On: 11/17/2017 7:38 AM      [facility]

## 2017-11-17 NOTE — Anesthesia Post-op Follow-up Note (Signed)
Anesthesia QCDR form completed.        

## 2017-11-17 NOTE — Anesthesia Preprocedure Evaluation (Addendum)
Anesthesia Evaluation  Patient identified by MRN, date of birth, ID band Patient awake    Reviewed: Allergy & Precautions, NPO status , Patient's Chart, lab work & pertinent test results, reviewed documented beta blocker date and time   Airway Mallampati: II  TM Distance: >3 FB     Dental  (+) Upper Dentures, Lower Dentures   Pulmonary former smoker,           Cardiovascular hypertension, Pt. on medications and Pt. on home beta blockers + CAD and + Cardiac Stents       Neuro/Psych  Headaches,    GI/Hepatic   Endo/Other  diabetes, Type 2Hypothyroidism   Renal/GU CRFRenal disease     Musculoskeletal  (+) Arthritis ,   Abdominal   Peds  Hematology  (+) anemia ,   Anesthesia Other Findings CLL.  Reproductive/Obstetrics                            Anesthesia Physical Anesthesia Plan  ASA: III  Anesthesia Plan: General   Post-op Pain Management:    Induction: Intravenous  PONV Risk Score and Plan:   Airway Management Planned:   Additional Equipment:   Intra-op Plan:   Post-operative Plan:   Informed Consent: I have reviewed the patients History and Physical, chart, labs and discussed the procedure including the risks, benefits and alternatives for the proposed anesthesia with the patient or authorized representative who has indicated his/her understanding and acceptance.     Plan Discussed with: CRNA  Anesthesia Plan Comments:         Anesthesia Quick Evaluation

## 2017-11-17 NOTE — Op Note (Signed)
[facility] Gastroenterology Patient Name: Scott [name] Procedure Date: 11/17/2017 7:37 AM MRN: [id number] Account #: [id number] Date of Birth: [birth year] Admit Type: Outpatient Age: 77 Room: [facility] ENDO ROOM 2 Gender: Male Note Status: Finalized Procedure:            Colonoscopy Indications:          Unexplained iron deficiency anemia Providers:            Lin Landsman MD, MD Medicines:            Monitored Anesthesia Care Complications:        No immediate complications. Estimated blood loss: None. Procedure:            Pre-Anesthesia Assessment:                       - Prior to the procedure, a History and Physical was                        performed, and patient medications and allergies were                        reviewed. The patient is competent. The risks and                        benefits of the procedure and the sedation options and                        risks were discussed with the patient. All questions                        were answered and informed consent was obtained.                        Patient identification and proposed procedure were                        verified by the physician, the nurse, the                        anesthesiologist, the anesthetist and the technician in                        the pre-procedure area in the procedure room in the                        endoscopy suite. Mental Status Examination: alert and                        oriented. Airway Examination: normal oropharyngeal                        airway and neck mobility. Respiratory Examination:                        clear to auscultation. CV Examination: normal.                        Prophylactic Antibiotics: The patient does not require  prophylactic antibiotics. Prior Anticoagulants: The                        patient has taken no previous anticoagulant or                        antiplatelet agents. ASA Grade Assessment: III - A                        patient with severe systemic disease. After reviewing                        the risks and benefits, the patient was deemed in                        satisfactory condition to undergo the procedure. The                        anesthesia plan was to use monitored anesthesia care                        (MAC). Immediately prior to administration of                        medications, the patient was re-assessed for adequacy                        to receive sedatives. The heart rate, respiratory rate,                        oxygen saturations, blood pressure, adequacy of                        pulmonary ventilation, and response to care were                        monitored throughout the procedure. The physical status                        of the patient was re-assessed after the procedure.                       After obtaining informed consent, the colonoscope was                        passed under direct vision. Throughout the procedure,                        the patient's blood pressure, pulse, and oxygen                        saturations were monitored continuously. The                        Colonoscope was introduced through the anus and                        advanced to the 20 cm into the ileum. The colonoscopy  was performed without difficulty. The patient tolerated                        the procedure well. The quality of the bowel                        preparation was evaluated using the BBPS [facility] Bowel                        Preparation Scale) with scores of: Right Colon = 3,                        Transverse Colon = 3 and Left Colon = 3 (entire mucosa                        seen well with no residual staining, small fragments of                        stool or opaque liquid). The total BBPS score equals 9. Findings:      The perianal and digital rectal examinations were normal. Pertinent       negatives include normal sphincter  tone and no palpable rectal lesions.      Two sessile polyps were found in the transverse colon and cecum. The       polyps were 5 to 7 mm in size. These polyps were removed with a hot       snare. Resection and retrieval were complete.      Scattered diverticula were found in the sigmoid colon, transverse colon       and ascending colon.      The mucosa vascular pattern in the rectum was locally increased.      Normal mucosa was found in the entire colon. Impression:           - Two 5 to 7 mm polyps in the transverse colon and in                        the cecum, removed with a hot snare. Resected and                        retrieved.                       - Diverticulosis in the sigmoid colon, in the                        transverse colon and in the ascending colon.                       - Increased mucosa vascular pattern in the rectum,                        prominent rectal veins                       - Normal mucosa in the entire examined colon. Recommendation:       - Discharge patient to home (with escort).                       - Resume previous diet  today.                       - Continue present medications.                       - Await pathology results.                       - Consider to repeat colonoscopy in 5 years for                        surveillance based on pathology results and depending                        on patient's overall medical condition at that time. Procedure Code(s):    --- Professional ---                       [phone], Colonoscopy, flexible; with removal of tumor(s),                        polyp(s), or other lesion(s) by snare technique Diagnosis Code(s):    --- Professional ---                       D12.3, Benign neoplasm of transverse colon (hepatic                        flexure or splenic flexure)                       D12.0, Benign neoplasm of cecum                       D50.9, Iron deficiency anemia, unspecified                       K57.30,  Diverticulosis of large intestine without                        perforation or abscess without bleeding CPT copyright 2017 American Medical Association. All rights reserved. The codes documented in this report are preliminary and upon coder review may  be revised to meet current compliance requirements. Dr. Ulyess Mort Lin Landsman MD, MD 11/17/2017 9:00:05 AM This report has been signed electronically. Number of Addenda: 0 Note Initiated On: 11/17/2017 7:37 AM Scope Withdrawal Time: 0 hours 17 minutes 39 seconds  Total Procedure Duration: 0 hours 22 minutes 33 seconds       [facility]

## 2017-11-17 NOTE — Transfer of Care (Signed)
Immediate Anesthesia Transfer of Care Note  Patient: Scott [name]  Procedure(s) Performed: ESOPHAGOGASTRODUODENOSCOPY (EGD) WITH PROPOFOL (N/A ) COLONOSCOPY WITH PROPOFOL (N/A )  Patient Location: PACU  Anesthesia Type:General  Level of Consciousness: sedated  Airway & Oxygen Therapy: Patient Spontanous Breathing and Patient connected to nasal cannula oxygen  Post-op Assessment: Report given to RN and Post -op Vital signs reviewed and stable  Post vital signs: Reviewed and stable  Last Vitals:  Vitals Value Taken Time  BP 110/48 11/17/2017  9:00 AM  Temp 36.1 C 11/17/2017  9:00 AM  Pulse 57 11/17/2017  9:00 AM  Resp 14 11/17/2017  9:00 AM  SpO2 100 % 11/17/2017  9:00 AM  Vitals shown include unvalidated device data.  Last Pain:  Vitals:   11/17/17 0859  TempSrc: Tympanic  PainSc: 0-No pain         Complications: No apparent anesthesia complications

## 2017-11-18 ENCOUNTER — Encounter: Payer: Self-pay | Admitting: Gastroenterology

## 2017-11-19 ENCOUNTER — Encounter: Payer: Self-pay | Admitting: Family Medicine

## 2017-11-19 LAB — SURGICAL PATHOLOGY

## 2017-11-24 ENCOUNTER — Ambulatory Visit: Payer: Medicare Other | Admitting: Family Medicine

## 2017-11-28 ENCOUNTER — Telehealth: Payer: Self-pay | Admitting: Family Medicine

## 2017-11-28 NOTE — Telephone Encounter (Signed)
Patient came into the office to drop off a sugar log for Dr. Danise Mina. Placed on the mail cart.

## 2017-11-28 NOTE — Telephone Encounter (Signed)
Dr. G, do you have this info? 

## 2017-11-30 NOTE — Progress Notes (Signed)
[place]  [phone] [phone]  ID: [name] OB: 1940/04/06  MR#: [id number]  MLY#:[id number]  Patient Care Team: [name], MD as PCP - General (Family Medicine) Lloyd Huger, MD as Consulting Physician (Oncology) Clent Jacks, MD as Consulting Physician (Ophthalmology) Dorothy Spark, MD as Consulting Physician (Cardiology) Madelon Lips, MD as Consulting Physician (Nephrology)  CHIEF COMPLAINT: CLL.  INTERVAL HISTORY: Patient returns to clinic today for repeat laboratory work and further evaluation.  He continues to feel well and remains asymptomatic. He has no neurologic complaints.  He has a good appetite and denies weight loss. He denies any night sweats. He has noted no lymphadenopathy. He has no chest pain or shortness of breath. He denies any nausea, vomiting, constipation, or diarrhea. He has no urinary complaints.  Patient feels at his baseline offers no specific complaints today.  REVIEW OF SYSTEMS:   Review of Systems  Constitutional: Negative.  Negative for fever, malaise/fatigue and weight loss.  Respiratory: Negative.  Negative for cough and shortness of breath.   Cardiovascular: Negative.  Negative for chest pain and leg swelling.  Gastrointestinal: Negative.  Negative for abdominal pain and blood in stool.  Genitourinary: Negative.  Negative for dysuria and hematuria.  Musculoskeletal: Negative.  Negative for back pain and neck pain.  Skin: Negative.  Negative for itching and rash.  Neurological: Negative.  Negative for sensory change, focal weakness, weakness and headaches.  Psychiatric/Behavioral: Negative.  The patient is not nervous/anxious.     As per HPI. Otherwise, a complete review of systems is negative.  PAST MEDICAL HISTORY: Past Medical History:  Diagnosis Date  . Acquired hand deformity 1962   hand saw accident at work  . Anemia 10/11/2011  . Arthritis   . Bradycardia 10/10/2011    . CAD (coronary artery disease) 10/10/2011   MI s/p PTCA (Dx-OM2 proximal concentric stenosis)  . CKD (chronic kidney disease) stage 3, GFR 30-59 ml/min (HCC)    [name]  . CLL (chronic lymphocytic leukemia) ([name])   . Colon polyps   . Generalized headaches    frequent  . Glaucoma    s/p laser surgery  . HLD (hyperlipidemia)   . Hypertension   . Hypothyroidism 10/10/2011  . Thrombocytopenia ([place]) 10/11/2011  . Type 2 diabetes with nephropathy [facility])    DM refresher course [facility] (04/2013)    PAST SURGICAL HISTORY: Past Surgical History:  Procedure Laterality Date  . BACK SURGERY     cervical neck  . CATARACT EXTRACTION, BILATERAL Bilateral 2017  . COLONOSCOPY  11/2008   1 polyp, diverticulosis, rec rpt 5 yrs (Dr. Oletta Lamas, [name])  . COLONOSCOPY  06/2014   hyperplastic polyp, rpt 5 yrs ([place])  . COLONOSCOPY WITH PROPOFOL N/A 11/17/2017   TA, HP, (Vanga, Tally Due, MD)  . ESOPHAGOGASTRODUODENOSCOPY (EGD) WITH PROPOFOL N/A 11/17/2017   healing erosive gastritis, intestinal metaplasia, neg H pylori (Vanga, Tally Due, MD)  . EYE SURGERY  2012   laser surgery for glaucoma  . PTCA  1994, 1995  . US ECHOCARDIOGRAPHY  10/2013   inferior wall hypokinesis, mild LVH, EF 50-55%, mild MR and LA dilation    FAMILY HISTORY: Family History  Problem Relation Age of Onset  . Diabetes Sister   . Stomach cancer Sister 93  . Pneumonia Father        caused death  . Other Brother        no communication with brother so unsure of any health conditions  . Coronary  artery disease Son 44       5v CABG and stents  . Hyperlipidemia Sister   . Stroke Neg Hx   . Heart attack Neg Hx     ADVANCED DIRECTIVES (Y/N):  N  HEALTH MAINTENANCE: Social History   Tobacco Use  . Smoking status: Former Smoker    Last attempt to quit: 03/04/1978    Years since quitting: 39.7  . Smokeless tobacco: Never Used  Substance Use Topics  . Alcohol use: No  . Drug use: No     Colonoscopy:  PAP:  Bone  density:  Lipid panel:  No Known Allergies  Current Outpatient Medications  Medication Sig Dispense Refill  . acetaminophen (TYLENOL) 650 MG CR tablet Take 650 mg by mouth every 8 (eight) hours as needed for pain.    Marland Kitchen aspirin EC 81 MG tablet Take 1 tablet (81 mg total) by mouth daily. 90 tablet 3  . atorvastatin (LIPITOR) 80 MG tablet TAKE 1 TABLET BY MOUTH ONCE DAILY 90 tablet 3  . carvedilol (COREG) 6.25 MG tablet TAKE 1 TABLET BY MOUTH TWICE DAILY 180 tablet 3  . Continuous Blood Gluc Receiver (FREESTYLE LIBRE 14 DAY READER) DEVI 1 Device by Does not apply route as directed. 1 Device 0  . Continuous Blood Gluc Sensor (FREESTYLE LIBRE 14 DAY SENSOR) MISC 1 Device by Does not apply route every 14 (fourteen) days. 6 each 3  . furosemide (LASIX) 20 MG tablet Take 1 tablet (20 mg total) by mouth daily. 30 tablet 4  . glucose blood (ONE TOUCH ULTRA TEST) test strip 1 each by Other route 4 (four) times daily. and as needed to check sugars Dx code:  E11.21 400 each 3  . insulin aspart (NOVOLOG) 100 UNIT/ML FlexPen Inject 5 Units into the skin 3 (three) times daily with meals. (3-7 units) 15 mL 1  . levothyroxine (SYNTHROID, LEVOTHROID) 50 MCG tablet TAKE 1 TABLET BY MOUTH ONCE DAILY 90 tablet 2  . lisinopril (PRINIVIL,ZESTRIL) 10 MG tablet Take 1 tablet (10 mg total) by mouth daily. 90 tablet 1  . niacin (SLO-NIACIN) 500 MG tablet Take 1,000 mg by mouth at bedtime.     . nitroGLYCERIN (NITROSTAT) 0.4 MG SL tablet Place 1 tablet (0.4 mg total) under the tongue every 5 (five) minutes as needed for chest pain ((MAX of 3 doses)). 25 tablet 0  . oxyCODONE-acetaminophen (PERCOCET/ROXICET) 5-325 MG tablet Take 1 tablet by mouth every 4 (four) hours as needed for severe pain. 30 tablet 0  . pantoprazole (PROTONIX) 40 MG tablet Take 1 tablet (40 mg total) by mouth daily. 30 tablet 0  . traZODone (DESYREL) 50 MG tablet Take 0.5-1 tablets (25-50 mg total) by mouth at bedtime as needed for sleep. 90 tablet 1    . triamcinolone cream (KENALOG) 0.1 % Apply 1 application topically 2 (two) times daily. Apply to AA. 45 g 0  . vitamin B-12 1000 MCG tablet Take 1 tablet (1,000 mcg total) by mouth daily. 120 tablet 0  . LEVEMIR FLEXTOUCH 100 UNIT/ML Pen INJECT 25 UNITS SUBCUTANEOUSLY ONCE DAILY AT  10PM 15 pen 3   No current facility-administered medications for this visit.     OBJECTIVE: Vitals:   12/01/17 1030 12/01/17 1034  BP:  (!) 154/71  Pulse:  (!) 51  Resp: 16   Temp:  98.2 F (36.8 C)     Body mass index is 21.53 kg/m.    ECOG FS:0 - Asymptomatic  General: Well-developed, well-nourished, no acute distress.  Eyes: Pink conjunctiva, anicteric sclera. HEENT: Normocephalic, moist mucous membranes. Lungs: Clear to auscultation bilaterally. Heart: Regular rate and rhythm. No rubs, murmurs, or gallops. Abdomen: Soft, nontender, nondistended. No organomegaly noted, normoactive bowel sounds. Musculoskeletal: No edema, cyanosis, or clubbing. Neuro: Alert, answering all questions appropriately. Cranial nerves grossly intact. Skin: No rashes or petechiae noted. Psych: Normal affect. Lymphatics: No cervical, calvicular, axillary or inguinal LAD.  LAB RESULTS:  Lab Results  Component Value Date   NA 140 08/23/2017   K 4.8 08/23/2017   CL 105 08/23/2017   CO2 24 08/23/2017   GLUCOSE 324 (H) 08/23/2017   BUN 33 (H) 08/23/2017   CREATININE 1.75 (H) 08/23/2017   CALCIUM 9.5 08/23/2017   PROT 6.3 (L) 08/23/2017   ALBUMIN 3.8 08/23/2017   AST 15 08/23/2017   ALT 14 (L) 08/23/2017   ALKPHOS 65 08/23/2017   BILITOT 0.8 08/23/2017   GFRNONAA 36 (L) 08/23/2017   GFRAA 42 (L) 08/23/2017    Lab Results  Component Value Date   WBC 5.4 12/01/2017   NEUTROABS 1.3 (L) 10/23/2017   HGB 10.5 (L) 12/01/2017   HCT 31.9 (L) 12/01/2017   MCV 95.4 12/01/2017   PLT 36 (L) 12/01/2017     STUDIES: No results found.  ASSESSMENT: CLL, Rai stage 0, anemia, ITP.  PLAN:    1. CLL: Bone marrow  biopsy completed on October 23, 2017 reviewed independently with involvement with CLL, unclear percentage of involvement.  Although patient was reported to have normal cytogenetics she was noted to have a 50% incidence of mutation in the ATM gene which is commonly associated with deletion 11 q.  Have ordered peripheral FISH panel for further evaluation.  Patient's white blood cell is within normal limits.  He has a mild anemia and worsening thrombocytopenia.  He received his last infusion of single agent Rituxan on September 03, 2017.  No further intervention is needed.  If patient requires additional treatment, may have to choose a more aggressive regimen such as Treanda plus Rituxan.  Return to clinic in 2 weeks for laboratory work only and then in 4 weeks for laboratory work and further evaluation. 2.  Thrombocytopenia: Patient's platelets are trending back down. 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  Patient previously received high-dose prednisone and IVIG, but these treatments were not durable.  Repeat laboratory work in 2 weeks and follow-up in 4 weeks as above. 3.  Anemia: Hemoglobin also trending back down and is now 10.5.  Patient had colonoscopy on November 17, 2017 that reviewed and removed to nonmalignant polyps.  EGD on the same day revealed nonbleeding erosive gastropathy with multiple nonbleeding duodenal ulcers.   4.  Reaction to Rituxan: Rate-based.  Patient will require premedications for any further infusions with Rituxan.    I spent a total of 30 minutes face-to-face with the patient of which greater than 50% of the visit was spent in counseling and coordination of care as detailed above.   Patient expressed understanding and was in agreement with this plan. He also understands that He can call clinic at any time with any questions, concerns, or complaints.    Lloyd Huger, MD   12/04/2017 12:19 PM

## 2017-12-01 ENCOUNTER — Inpatient Hospital Stay (HOSPITAL_BASED_OUTPATIENT_CLINIC_OR_DEPARTMENT_OTHER): Payer: Medicare Other | Admitting: Oncology

## 2017-12-01 ENCOUNTER — Encounter: Payer: Self-pay | Admitting: Oncology

## 2017-12-01 ENCOUNTER — Other Ambulatory Visit: Payer: Self-pay

## 2017-12-01 ENCOUNTER — Inpatient Hospital Stay: Payer: Medicare Other | Attending: Oncology

## 2017-12-01 VITALS — BP 154/71 | HR 51 | Temp 98.2°F | Resp 16 | Ht 71.0 in | Wt 154.4 lb

## 2017-12-01 DIAGNOSIS — N183 Chronic kidney disease, stage 3 (moderate): Secondary | ICD-10-CM | POA: Diagnosis not present

## 2017-12-01 DIAGNOSIS — Z87891 Personal history of nicotine dependence: Secondary | ICD-10-CM | POA: Insufficient documentation

## 2017-12-01 DIAGNOSIS — E039 Hypothyroidism, unspecified: Secondary | ICD-10-CM

## 2017-12-01 DIAGNOSIS — I129 Hypertensive chronic kidney disease with stage 1 through stage 4 chronic kidney disease, or unspecified chronic kidney disease: Secondary | ICD-10-CM | POA: Insufficient documentation

## 2017-12-01 DIAGNOSIS — C9110 Chronic lymphocytic leukemia of B-cell type not having achieved remission: Secondary | ICD-10-CM | POA: Diagnosis not present

## 2017-12-01 DIAGNOSIS — D693 Immune thrombocytopenic purpura: Secondary | ICD-10-CM

## 2017-12-01 DIAGNOSIS — D649 Anemia, unspecified: Secondary | ICD-10-CM

## 2017-12-01 DIAGNOSIS — E1122 Type 2 diabetes mellitus with diabetic chronic kidney disease: Secondary | ICD-10-CM | POA: Diagnosis not present

## 2017-12-01 DIAGNOSIS — D696 Thrombocytopenia, unspecified: Secondary | ICD-10-CM | POA: Diagnosis not present

## 2017-12-01 DIAGNOSIS — I2510 Atherosclerotic heart disease of native coronary artery without angina pectoris: Secondary | ICD-10-CM

## 2017-12-01 LAB — CBC
HCT: 31.9 % — ABNORMAL LOW (ref 40.0–52.0)
HEMOGLOBIN: 10.5 g/dL — AB (ref 13.0–18.0)
MCH: 31.3 pg (ref 26.0–34.0)
MCHC: 32.8 g/dL (ref 32.0–36.0)
MCV: 95.4 fL (ref 80.0–100.0)
PLATELETS: 36 10*3/uL — AB (ref 150–440)
RBC: 3.35 MIL/uL — AB (ref 4.40–5.90)
RDW: 14.6 % — ABNORMAL HIGH (ref 11.5–14.5)
WBC: 5.4 10*3/uL (ref 3.8–10.6)

## 2017-12-01 NOTE — Progress Notes (Signed)
Patient here for follow up.No changes since his last appointment. 

## 2017-12-01 NOTE — Telephone Encounter (Signed)
Received, reviewed log. Sent for scanning. Current sugar regimen: 3-7u with meals, levemir 25u at bedtime.  Plz notify patient - morning sugars looking better but later in the day staying too high - work on scheduled meal times, we will likely change insulin dose next visit - bring sugar log to next visit as well.

## 2017-12-01 NOTE — Telephone Encounter (Signed)
Patient notified as instructed by telephone and verbalized understanding. Patient stated that he has been taking 7-8 units of insulin with meals because of his sugar reading. . Patient stated that he does not have an appetite during the day and if he forces himself to eat he get nauseated. Patient wants to know if there is anything that you can give him to increase his appetite?

## 2017-12-04 ENCOUNTER — Other Ambulatory Visit: Payer: Self-pay | Admitting: Family Medicine

## 2017-12-05 ENCOUNTER — Encounter: Payer: Self-pay | Admitting: Gastroenterology

## 2017-12-05 ENCOUNTER — Ambulatory Visit (INDEPENDENT_AMBULATORY_CARE_PROVIDER_SITE_OTHER): Payer: Medicare Other | Admitting: Gastroenterology

## 2017-12-05 VITALS — BP 157/68 | HR 61 | Resp 16 | Ht 71.0 in | Wt 154.6 lb

## 2017-12-05 DIAGNOSIS — D500 Iron deficiency anemia secondary to blood loss (chronic): Secondary | ICD-10-CM

## 2017-12-05 DIAGNOSIS — K279 Peptic ulcer, site unspecified, unspecified as acute or chronic, without hemorrhage or perforation: Secondary | ICD-10-CM

## 2017-12-05 MED ORDER — PANTOPRAZOLE SODIUM 40 MG PO TBEC: 40.0000 mg | DELAYED_RELEASE_TABLET | Freq: Two times a day (BID) | ORAL | 0 refills | Status: DC

## 2017-12-05 NOTE — Telephone Encounter (Signed)
Spoke to pt. He said his weight is 154#. He is sleeping okay. He will try some Glucerna.

## 2017-12-05 NOTE — Progress Notes (Signed)
[name], MD [address]  [place]  [place], [place] [address]  Main: [phone]  Fax: [phone]    Gastroenterology Consultation  Referring Provider:     [name], MD Primary Care Physician:  [name], MD Primary Gastroenterologist:  Dr. [name] Reason for Consultation:     Iron deficiency anemia        HPI:   Scott [name] is a 77 y.o. male referred by Dr. [name], MD  for consultation & management of and deficiency anemia. Patient was admitted to [facility] in 07/2017 with symptomatic anemia, hemoglobin 5.5, platelets of 8 with known history of CLL. His pancytopenia was thought to be secondary to CLL. Endoscopic evaluation was not performed during that admission because of severe Thrombocytopenia in the setting of CLL.patient received prednisone and IVIG as inpatient. Patient is closely followed by Dr. [name] at [place] and started on rituximab, received 2 infusions. His platelet count is 55 today, hemoglobin 10.4. He also has severe iron deficiency as well as borderline low B12 levels. He received parenteral iron therapy as well as oral B12. His ferritin levels improved as well as B12 levels. Patient is here to discuss about endoscopy evaluation. He denies abdominal pain, nausea, vomiting, rectal bleeding, melena, hematemesis, hematochezia. He stopped oral iron and B12  Follow-up visit 12/05/2017: Patient finished treatment for his CLL with rituximab.  His thrombocytopenia improved.  He underwent EGD and colonoscopy for iron deficiency anemia.  Found to have gastric erosions and duodenal ulcers.  There was no evidence of H. pylori.  Colonoscopy did not reveal evidence of iron deficiency anemia, terminal ileum normal.  Patient denies any bleeding episodes such as hematemesis, hematochezia, epistaxis, rectal bleeding.  He is being followed by Dr. [name] for his CLL.  He denies any GI symptoms.  He is not on  any acid suppression medication.  NSAIDs:  none  Antiplts/Anticoagulants/Anti thrombotics: none  GI Procedures: underwent colonoscopy in 2010, subcentimeter polyps were removed Underwent colonoscopy in 2016 in [place], 10 mm polyp from ascending colon was removed  EGD and colonoscopy 11/2017 - Multiple non-bleeding duodenal ulcers with a clean ulcer base ([place] Class III). - Non-bleeding erosive gastropathy. - Non-bleeding erosive gastropathy. Biopsied. - Small hiatal hernia. - Normal gastroesophageal junction and esophagus.  - Two 5 to 7 mm polyps in the transverse colon and in the cecum, removed with a hot snare. Resected and retrieved. - Diverticulosis in the sigmoid colon, in the transverse colon and in the ascending colon. - Increased mucosa vascular pattern in the rectum, prominent rectal veins - Normal mucosa in the entire examined colon.  DIAGNOSIS:  A. STOMACH, RANDOM; COLD BIOPSY:  - ANTRAL MUCOSA WITH REACTIVE AND HEALING EROSIVE GASTRITIS; FOCAL  INTESTINAL METAPLASIA IS PRESENT.  - OXYNTIC MUCOSA WITH MODERATE CHRONIC GASTRITIS AND CHANGES CONSISTENT  WITH PROTON PUMP INHIBITOR EFFECT.  - IHC FOR HELICOBACTER IS NEGATIVE.  - NEGATIVE FOR DYSPLASIA AND MALIGNANCY.   B. COLON POLYP, CECUM; HOT SNARE:  - TUBULAR ADENOMA.  - NEGATIVE FOR HIGH-GRADE DYSPLASIA AND MALIGNANCY.   C. COLON POLYP, TRANSVERSE; HOT SNARE:  - HYPERPLASTIC POLYP.  - NEGATIVE FOR DYSPLASIA AND MALIGNANCY.   Past Medical History:  Diagnosis Date  . Acquired hand deformity 1962   hand saw accident at work  . Anemia 10/11/2011  . Arthritis   . Bradycardia 10/10/2011  . CAD (coronary artery disease) 10/10/2011   MI s/p PTCA (Dx-OM2 proximal concentric stenosis)  .  CKD (chronic kidney disease) stage 3, GFR 30-59 ml/min (HCC)    [name]  . CLL (chronic lymphocytic leukemia) ([place])   . Colon polyps   . Generalized headaches    frequent  . Glaucoma    s/p laser surgery  . HLD  (hyperlipidemia)   . Hypertension   . Hypothyroidism 10/10/2011  . Thrombocytopenia ([place]) 10/11/2011  . Type 2 diabetes with nephropathy [facility])    DM refresher course [facility] (04/2013)    Past Surgical History:  Procedure Laterality Date  . BACK SURGERY     cervical neck  . CATARACT EXTRACTION, BILATERAL Bilateral 2017  . COLONOSCOPY  11/2008   1 polyp, diverticulosis, rec rpt 5 yrs (Dr. Oletta Lamas, [name])  . COLONOSCOPY  06/2014   hyperplastic polyp, rpt 5 yrs ([place])  . COLONOSCOPY WITH PROPOFOL N/A 11/17/2017   TA, HP, (Vanga, Tally Due, MD)  . ESOPHAGOGASTRODUODENOSCOPY (EGD) WITH PROPOFOL N/A 11/17/2017   healing erosive gastritis, intestinal metaplasia, neg H pylori (Vanga, Tally Due, MD)  . EYE SURGERY  2012   laser surgery for glaucoma  . PTCA  1994, 1995  . US ECHOCARDIOGRAPHY  10/2013   inferior wall hypokinesis, mild LVH, EF 50-55%, mild MR and LA dilation    Current Outpatient Medications:  .  acetaminophen (TYLENOL) 650 MG CR tablet, Take 650 mg by mouth every 8 (eight) hours as needed for pain., Disp: , Rfl:  .  aspirin EC 81 MG tablet, Take 1 tablet (81 mg total) by mouth daily., Disp: 90 tablet, Rfl: 3 .  atorvastatin (LIPITOR) 80 MG tablet, TAKE 1 TABLET BY MOUTH ONCE DAILY, Disp: 90 tablet, Rfl: 3 .  carvedilol (COREG) 6.25 MG tablet, TAKE 1 TABLET BY MOUTH TWICE DAILY, Disp: 180 tablet, Rfl: 3 .  Continuous Blood Gluc Receiver (FREESTYLE LIBRE 14 DAY READER) DEVI, 1 Device by Does not apply route as directed., Disp: 1 Device, Rfl: 0 .  Continuous Blood Gluc Sensor (FREESTYLE LIBRE 14 DAY SENSOR) MISC, 1 Device by Does not apply route every 14 (fourteen) days., Disp: 6 each, Rfl: 3 .  furosemide (LASIX) 20 MG tablet, Take 1 tablet (20 mg total) by mouth daily., Disp: 30 tablet, Rfl: 4 .  glucose blood (ONE TOUCH ULTRA TEST) test strip, 1 each by Other route 4 (four) times daily. and as needed to check sugars Dx code:  E11.21, Disp: 400 each, Rfl: 3 .  insulin aspart  (NOVOLOG) 100 UNIT/ML FlexPen, Inject 5 Units into the skin 3 (three) times daily with meals. (3-7 units), Disp: 15 mL, Rfl: 1 .  LEVEMIR FLEXTOUCH 100 UNIT/ML Pen, INJECT 25 UNITS SUBCUTANEOUSLY ONCE DAILY AT  10PM, Disp: 15 pen, Rfl: 3 .  levothyroxine (SYNTHROID, LEVOTHROID) 50 MCG tablet, TAKE 1 TABLET BY MOUTH ONCE DAILY, Disp: 90 tablet, Rfl: 2 .  lisinopril (PRINIVIL,ZESTRIL) 10 MG tablet, Take 1 tablet (10 mg total) by mouth daily., Disp: 90 tablet, Rfl: 1 .  niacin (SLO-NIACIN) 500 MG tablet, Take 1,000 mg by mouth at bedtime. , Disp: , Rfl:  .  nitroGLYCERIN (NITROSTAT) 0.4 MG SL tablet, Place 1 tablet (0.4 mg total) under the tongue every 5 (five) minutes as needed for chest pain ((MAX of 3 doses))., Disp: 25 tablet, Rfl: 0 .  pantoprazole (PROTONIX) 40 MG tablet, Take 1 tablet (40 mg total) by mouth 2 (two) times daily before a meal., Disp: 180 tablet, Rfl: 0 .  traZODone (DESYREL) 50 MG tablet, Take 0.5-1 tablets (25-50 mg total) by mouth at bedtime as  needed for sleep., Disp: 90 tablet, Rfl: 1 .  triamcinolone cream (KENALOG) 0.1 %, Apply 1 application topically 2 (two) times daily. Apply to AA., Disp: 45 g, Rfl: 0 .  vitamin B-12 1000 MCG tablet, Take 1 tablet (1,000 mcg total) by mouth daily., Disp: 120 tablet, Rfl: 0 .  oxyCODONE-acetaminophen (PERCOCET/ROXICET) 5-325 MG tablet, Take 1 tablet by mouth every 4 (four) hours as needed for severe pain. (Patient not taking: Reported on 12/05/2017), Disp: 30 tablet, Rfl: 0    Family History  Problem Relation Age of Onset  . Diabetes Sister   . Stomach cancer Sister 38  . Pneumonia Father        caused death  . Other Brother        no communication with brother so unsure of any health conditions  . Coronary artery disease Son 55       5v CABG and stents  . Hyperlipidemia Sister   . Stroke Neg Hx   . Heart attack Neg Hx      Social History   Tobacco Use  . Smoking status: Former Smoker    Last attempt to quit: 03/04/1978     Years since quitting: 39.7  . Smokeless tobacco: Never Used  Substance Use Topics  . Alcohol use: No  . Drug use: No    Allergies as of 12/05/2017  . (No Known Allergies)    Review of Systems:    All systems reviewed and negative except where noted in HPI.   Physical Exam:  BP (!) 157/68 (BP Location: Left Arm, Patient Position: Sitting, Cuff Size: Normal)   Pulse 61   Resp 16   Ht 5\' 11"  (1.803 m)   Wt 154 lb 9.6 oz (70.1 kg)   BMI 21.56 kg/m  No LMP for male patient.  General:   Alert,  Well-developed, well-nourished, pleasant and cooperative in NAD Head:  Normocephalic and atraumatic. Eyes:  Sclera clear, no icterus.   Conjunctiva pink. Ears:  Normal auditory acuity. Nose:  No deformity, discharge, or lesions. Mouth:  No deformity or lesions,oropharynx pink & moist. Neck:  Supple; no masses or thyromegaly. Lungs:  Respirations even and unlabored.  Clear throughout to auscultation.   No wheezes, crackles, or rhonchi. No acute distress. Heart:  Regular rate and rhythm; no murmurs, clicks, rubs, or gallops. Abdomen:  Normal bowel sounds. Soft, non-tender and non-distended without masses, hepatosplenomegaly or hernias noted.  No guarding or rebound tenderness.   Rectal: Not performed Msk:  Symmetrical without gross deformities. Good, equal movement & strength bilaterally. Pulses:  Normal pulses noted. Extremities:  No clubbing or edema.  No cyanosis. Neurologic:  Alert and oriented x3;  grossly normal neurologically. Skin:  Intact without significant lesions or rashes. No jaundice. Lymph Nodes:  No significant cervical adenopathy. Psych:  Alert and cooperative. Normal mood and affect.  Imaging Studies: No abdominal imaging  Assessment and Plan:   Scott [name] is a 77 y.o. Caucasian male with CLL with anemia and Thrombocytopenia, s/p rituximab is seen for follow-up of iron deficiency anemia.   Iron deficiency anemia: Most likely blood loss from gastric erosions and  duodenal ulcers No evidence of H. pylori based on biopsies Hemoglobin is fairly stable, ferritin levels are normal Recheck ferritin today Recommend to start Protonix 40 mg twice daily at least for 3 months  Focal intestinal metaplasia in stomach: Will discuss about EGD with gastric mapping during next visit Check H. pylori IgG during next visit and treat empirically with  antibiotics if positive  Personal history of colon adenoma: Repeat colonoscopy in 5 years  Follow up based in 3 months   [name], MD

## 2017-12-05 NOTE — Telephone Encounter (Addendum)
How is he sleeping and how are weights? Would suggest he try glucerna 1/2-1 can 30 min before a meal as this can sometimes help stimulate appetite.

## 2017-12-06 LAB — FERRITIN: Ferritin: 113 ng/mL (ref 30–400)

## 2017-12-15 ENCOUNTER — Inpatient Hospital Stay: Payer: Medicare Other | Attending: Oncology

## 2017-12-15 DIAGNOSIS — C9110 Chronic lymphocytic leukemia of B-cell type not having achieved remission: Secondary | ICD-10-CM | POA: Diagnosis not present

## 2017-12-15 LAB — CBC WITH DIFFERENTIAL/PLATELET
Abs Immature Granulocytes: 0.03 10*3/uL (ref 0.00–0.07)
BASOS ABS: 0 10*3/uL (ref 0.0–0.1)
Basophils Relative: 0 %
EOS PCT: 5 %
Eosinophils Absolute: 0.4 10*3/uL (ref 0.0–0.5)
HCT: 34.5 % — ABNORMAL LOW (ref 39.0–52.0)
HEMOGLOBIN: 11 g/dL — AB (ref 13.0–17.0)
IMMATURE GRANULOCYTES: 0 %
LYMPHS PCT: 55 %
Lymphs Abs: 3.8 10*3/uL (ref 0.7–4.0)
MCH: 31.1 pg (ref 26.0–34.0)
MCHC: 31.9 g/dL (ref 30.0–36.0)
MCV: 97.5 fL (ref 80.0–100.0)
MONO ABS: 0.3 10*3/uL (ref 0.1–1.0)
Monocytes Relative: 4 %
NRBC: 0 % (ref 0.0–0.2)
Neutro Abs: 2.6 10*3/uL (ref 1.7–7.7)
Neutrophils Relative %: 36 %
Platelets: 33 10*3/uL — ABNORMAL LOW (ref 150–400)
RBC: 3.54 MIL/uL — AB (ref 4.22–5.81)
RDW: 14.6 % (ref 11.5–15.5)
Smear Review: DECREASED
WBC: 7.1 10*3/uL (ref 4.0–10.5)

## 2017-12-17 ENCOUNTER — Encounter: Payer: Self-pay | Admitting: Oncology

## 2017-12-22 LAB — BCR-ABL1 FISH
CELLS COUNTED: 200
Cells Analyzed: 200

## 2018-01-04 NOTE — Progress Notes (Signed)
Scott [name]  [phone] [phone]) [phone]  ID: [name] OB: 05/27/1940  MR#: [id number]  OMV#:[id number]  Patient Care Team: [name], MD as PCP - General (Family Medicine) Lloyd Huger, MD as Consulting Physician (Oncology) Clent Jacks, MD as Consulting Physician (Ophthalmology) Dorothy Spark, MD as Consulting Physician (Cardiology) Madelon Lips, MD as Consulting Physician (Nephrology)  CHIEF COMPLAINT: CLL.  INTERVAL HISTORY: Patient returns to clinic today for repeat laboratory can further evaluation.  He continues to feel well and remains asymptomatic.  He denies any easy bleeding or bruising.  He has no neurologic complaints.  He has a good appetite and denies weight loss. He denies any night sweats. He has noted no lymphadenopathy. He has no chest pain or shortness of breath. He denies any nausea, vomiting, constipation, or diarrhea. He has no urinary complaints.  Patient feels at his baseline offers no specific complaints today.  REVIEW OF SYSTEMS:   Review of Systems  Constitutional: Negative.  Negative for fever, malaise/fatigue and weight loss.  Respiratory: Negative.  Negative for cough and shortness of breath.   Cardiovascular: Negative.  Negative for chest pain and leg swelling.  Gastrointestinal: Negative.  Negative for abdominal pain and blood in stool.  Genitourinary: Negative.  Negative for dysuria and hematuria.  Musculoskeletal: Negative.  Negative for back pain and neck pain.  Skin: Negative.  Negative for itching and rash.  Neurological: Negative.  Negative for sensory change, focal weakness, weakness and headaches.  Endo/Heme/Allergies: Does not bruise/bleed easily.  Psychiatric/Behavioral: Negative.  The patient is not nervous/anxious.     As per HPI. Otherwise, a complete review of systems is negative.  PAST MEDICAL HISTORY: Past Medical History:  Diagnosis Date  . Acquired hand deformity  1962   hand saw accident at work  . Anemia 10/11/2011  . Arthritis   . Bradycardia 10/10/2011  . CAD (coronary artery disease) 10/10/2011   MI s/p PTCA (Dx-OM2 proximal concentric stenosis)  . CKD (chronic kidney disease) stage 3, GFR 30-59 ml/min (HCC)    [name]  . CLL (chronic lymphocytic leukemia) ([place])   . Colon polyps   . Generalized headaches    frequent  . GI bleed 07/08/2017  . Glaucoma    s/p laser surgery  . HLD (hyperlipidemia)   . Hypertension   . Hypothyroidism 10/10/2011  . Thrombocytopenia ([place]) 10/11/2011  . Type 2 diabetes with nephropathy [facility])    DM refresher course [facility] (04/2013)    PAST SURGICAL HISTORY: Past Surgical History:  Procedure Laterality Date  . BACK SURGERY     cervical neck  . CATARACT EXTRACTION, BILATERAL Bilateral 2017  . COLONOSCOPY  11/2008   1 polyp, diverticulosis, rec rpt 5 yrs (Dr. Oletta Lamas, [name])  . COLONOSCOPY  06/2014   hyperplastic polyp, rpt 5 yrs ([place])  . COLONOSCOPY WITH PROPOFOL N/A 11/17/2017   TA, HP, (Vanga, Tally Due, MD)  . ESOPHAGOGASTRODUODENOSCOPY (EGD) WITH PROPOFOL N/A 11/17/2017   healing erosive gastritis, intestinal metaplasia, neg H pylori (Vanga, Tally Due, MD)  . EYE SURGERY  2012   laser surgery for glaucoma  . PTCA  1994, 1995  . US ECHOCARDIOGRAPHY  10/2013   inferior wall hypokinesis, mild LVH, EF 50-55%, mild MR and LA dilation    FAMILY HISTORY: Family History  Problem Relation Age of Onset  . Diabetes Sister   . Stomach cancer Sister 71  . Pneumonia Father        caused death  . Other Brother  no communication with brother so unsure of any health conditions  . Coronary artery disease Son 5       5v CABG and stents  . Hyperlipidemia Sister   . Stroke Neg Hx   . Heart attack Neg Hx     ADVANCED DIRECTIVES (Y/N):  N  HEALTH MAINTENANCE: Social History   Tobacco Use  . Smoking status: Former Smoker    Last attempt to quit: 03/04/1978    Years since quitting: 39.8  . Smokeless  tobacco: Never Used  Substance Use Topics  . Alcohol use: No  . Drug use: No     Colonoscopy:  PAP:  Bone density:  Lipid panel:  No Known Allergies  Current Outpatient Medications  Medication Sig Dispense Refill  . acetaminophen (TYLENOL) 650 MG CR tablet Take 650 mg by mouth every 8 (eight) hours as needed for pain.    Marland Kitchen aspirin EC 81 MG tablet Take 1 tablet (81 mg total) by mouth daily. 90 tablet 3  . atorvastatin (LIPITOR) 80 MG tablet TAKE 1 TABLET BY MOUTH ONCE DAILY 90 tablet 3  . carvedilol (COREG) 6.25 MG tablet TAKE 1 TABLET BY MOUTH TWICE DAILY 180 tablet 3  . Continuous Blood Gluc Receiver (FREESTYLE LIBRE 14 DAY READER) DEVI 1 Device by Does not apply route as directed. 1 Device 0  . Continuous Blood Gluc Sensor (FREESTYLE LIBRE 14 DAY SENSOR) MISC 1 Device by Does not apply route every 14 (fourteen) days. 6 each 3  . furosemide (LASIX) 20 MG tablet Take 1 tablet (20 mg total) by mouth daily. 30 tablet 4  . glucose blood (ONE TOUCH ULTRA TEST) test strip 1 each by Other route 4 (four) times daily. and as needed to check sugars Dx code:  E11.21 400 each 3  . insulin aspart (NOVOLOG) 100 UNIT/ML FlexPen Inject 5 Units into the skin 3 (three) times daily with meals. (3-7 units) 15 mL 1  . LEVEMIR FLEXTOUCH 100 UNIT/ML Pen INJECT 25 UNITS SUBCUTANEOUSLY ONCE DAILY AT  10PM 15 pen 3  . levothyroxine (SYNTHROID, LEVOTHROID) 50 MCG tablet TAKE 1 TABLET BY MOUTH ONCE DAILY 90 tablet 2  . lisinopril (PRINIVIL,ZESTRIL) 10 MG tablet Take 1 tablet (10 mg total) by mouth daily. 90 tablet 1  . niacin (SLO-NIACIN) 500 MG tablet Take 1,000 mg by mouth at bedtime.     . nitroGLYCERIN (NITROSTAT) 0.4 MG SL tablet Place 1 tablet (0.4 mg total) under the tongue every 5 (five) minutes as needed for chest pain ((MAX of 3 doses)). 25 tablet 0  . traZODone (DESYREL) 50 MG tablet Take 0.5-1 tablets (25-50 mg total) by mouth at bedtime as needed for sleep. 90 tablet 1  . triamcinolone cream  (KENALOG) 0.1 % Apply 1 application topically 2 (two) times daily. Apply to AA. 45 g 0  . vitamin B-12 1000 MCG tablet Take 1 tablet (1,000 mcg total) by mouth daily. 120 tablet 0  . oxyCODONE-acetaminophen (PERCOCET/ROXICET) 5-325 MG tablet Take 1 tablet by mouth every 4 (four) hours as needed for severe pain. (Patient not taking: Reported on 12/05/2017) 30 tablet 0  . pantoprazole (PROTONIX) 40 MG tablet Take 1 tablet (40 mg total) by mouth 2 (two) times daily before a meal. 180 tablet 0   No current facility-administered medications for this visit.     OBJECTIVE: Vitals:   01/05/18 1133  BP: (!) 147/81  Pulse: 77  Resp: 18  Temp: 97.6 F (36.4 C)     Body mass index  is 21.51 kg/m.    ECOG FS:0 - Asymptomatic  General: Well-developed, well-nourished, no acute distress. Eyes: Pink conjunctiva, anicteric sclera. HEENT: Normocephalic, moist mucous membranes. Lungs: Clear to auscultation bilaterally. Heart: Regular rate and rhythm. No rubs, murmurs, or gallops. Abdomen: Soft, nontender, nondistended. No organomegaly noted, normoactive bowel sounds. Musculoskeletal: No edema, cyanosis, or clubbing. Neuro: Alert, answering all questions appropriately. Cranial nerves grossly intact. Skin: No rashes or petechiae noted. Psych: Normal affect.  LAB RESULTS:  Lab Results  Component Value Date   NA 140 08/23/2017   K 4.8 08/23/2017   CL 105 08/23/2017   CO2 24 08/23/2017   GLUCOSE 324 (H) 08/23/2017   BUN 33 (H) 08/23/2017   CREATININE 1.75 (H) 08/23/2017   CALCIUM 9.5 08/23/2017   PROT 6.3 (L) 08/23/2017   ALBUMIN 3.8 08/23/2017   AST 15 08/23/2017   ALT 14 (L) 08/23/2017   ALKPHOS 65 08/23/2017   BILITOT 0.8 08/23/2017   GFRNONAA 36 (L) 08/23/2017   GFRAA 42 (L) 08/23/2017    Lab Results  Component Value Date   WBC 7.1 01/05/2018   NEUTROABS 2.0 01/05/2018   HGB 10.2 (L) 01/05/2018   HCT 32.9 (L) 01/05/2018   MCV 99.1 01/05/2018   PLT 24 (LL) 01/05/2018      STUDIES: No results found.  ASSESSMENT: CLL, Rai stage 0, anemia, ITP.  PLAN:    1. CLL: Bone marrow biopsy completed on October 23, 2017 reviewed independently with involvement with CLL, unclear percentage of involvement.  Although patient was reported to have normal cytogenetics he was noted to have a 50% incidence of mutation in the ATM gene which is commonly associated with deletion 11q. will order peripheral blood FISH panel for further evaluation.  Patient's white blood cell count continues to be within normal limits.  He received his last infusion of single agent Rituxan on September 03, 2017.  No further intervention is needed.  If patient requires additional treatment could possibly transition him to [place].   2.  Thrombocytopenia: Patient's platelets are trending back down.  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  Patient has received high-dose prednisone, IVIG, and Rituxan with no appreciable durability to improve his platelet count.  Patient will return to clinic next week to receive Nplate injection.  He was then return to clinic in 4 weeks for further evaluation and consideration of additional Nplate.  3.  Anemia: Decreased, but stable at 10.2.  Patient had colonoscopy on November 17, 2017 that removed 2 nonmalignant polyps.  EGD on the same day revealed nonbleeding erosive gastropathy with multiple nonbleeding duodenal ulcers.   4.  Reaction to Rituxan: Rate-based.  Patient will require premedications for any further infusions with Rituxan.    I spent a total of 30 minutes face-to-face with the patient of which greater than 50% of the visit was spent in counseling and coordination of care as detailed above.  Patient expressed understanding and was in agreement with this plan. He also understands that He can call clinic at any time with any questions, concerns, or complaints.    Lloyd Huger, MD   01/06/2018 6:16 AM

## 2018-01-05 ENCOUNTER — Other Ambulatory Visit: Payer: Self-pay | Admitting: *Deleted

## 2018-01-05 ENCOUNTER — Inpatient Hospital Stay: Payer: Medicare Other | Attending: Oncology

## 2018-01-05 ENCOUNTER — Other Ambulatory Visit: Payer: Self-pay

## 2018-01-05 ENCOUNTER — Inpatient Hospital Stay (HOSPITAL_BASED_OUTPATIENT_CLINIC_OR_DEPARTMENT_OTHER): Payer: Medicare Other | Admitting: Oncology

## 2018-01-05 ENCOUNTER — Encounter: Payer: Self-pay | Admitting: Oncology

## 2018-01-05 VITALS — BP 147/81 | HR 77 | Temp 97.6°F | Resp 18 | Wt 154.2 lb

## 2018-01-05 DIAGNOSIS — D693 Immune thrombocytopenic purpura: Secondary | ICD-10-CM | POA: Diagnosis not present

## 2018-01-05 DIAGNOSIS — I129 Hypertensive chronic kidney disease with stage 1 through stage 4 chronic kidney disease, or unspecified chronic kidney disease: Secondary | ICD-10-CM | POA: Diagnosis not present

## 2018-01-05 DIAGNOSIS — I2510 Atherosclerotic heart disease of native coronary artery without angina pectoris: Secondary | ICD-10-CM

## 2018-01-05 DIAGNOSIS — E039 Hypothyroidism, unspecified: Secondary | ICD-10-CM

## 2018-01-05 DIAGNOSIS — N183 Chronic kidney disease, stage 3 (moderate): Secondary | ICD-10-CM | POA: Diagnosis not present

## 2018-01-05 DIAGNOSIS — E1122 Type 2 diabetes mellitus with diabetic chronic kidney disease: Secondary | ICD-10-CM | POA: Insufficient documentation

## 2018-01-05 DIAGNOSIS — D649 Anemia, unspecified: Secondary | ICD-10-CM | POA: Insufficient documentation

## 2018-01-05 DIAGNOSIS — Z79899 Other long term (current) drug therapy: Secondary | ICD-10-CM | POA: Diagnosis not present

## 2018-01-05 DIAGNOSIS — Z87891 Personal history of nicotine dependence: Secondary | ICD-10-CM | POA: Insufficient documentation

## 2018-01-05 DIAGNOSIS — C9110 Chronic lymphocytic leukemia of B-cell type not having achieved remission: Secondary | ICD-10-CM | POA: Insufficient documentation

## 2018-01-05 DIAGNOSIS — R634 Abnormal weight loss: Secondary | ICD-10-CM

## 2018-01-05 LAB — CBC WITH DIFFERENTIAL/PLATELET
ABS IMMATURE GRANULOCYTES: 0.05 10*3/uL (ref 0.00–0.07)
Basophils Absolute: 0 10*3/uL (ref 0.0–0.1)
Basophils Relative: 0 %
EOS PCT: 4 %
Eosinophils Absolute: 0.3 10*3/uL (ref 0.0–0.5)
HEMATOCRIT: 32.9 % — AB (ref 39.0–52.0)
HEMOGLOBIN: 10.2 g/dL — AB (ref 13.0–17.0)
IMMATURE GRANULOCYTES: 1 %
LYMPHS ABS: 4.3 10*3/uL — AB (ref 0.7–4.0)
LYMPHS PCT: 61 %
MCH: 30.7 pg (ref 26.0–34.0)
MCHC: 31 g/dL (ref 30.0–36.0)
MCV: 99.1 fL (ref 80.0–100.0)
MONO ABS: 0.4 10*3/uL (ref 0.1–1.0)
MONOS PCT: 6 %
NEUTROS ABS: 2 10*3/uL (ref 1.7–7.7)
Neutrophils Relative %: 28 %
Platelets: 24 10*3/uL — CL (ref 150–400)
RBC: 3.32 MIL/uL — ABNORMAL LOW (ref 4.22–5.81)
RDW: 14.8 % (ref 11.5–15.5)
WBC Morphology: ABNORMAL
WBC: 7.1 10*3/uL (ref 4.0–10.5)
nRBC: 0 % (ref 0.0–0.2)

## 2018-01-05 NOTE — Progress Notes (Signed)
Pt in for follow up denies any difficulties or concerns today.  

## 2018-01-06 ENCOUNTER — Other Ambulatory Visit: Payer: Self-pay | Admitting: Oncology

## 2018-01-06 ENCOUNTER — Other Ambulatory Visit: Payer: Self-pay | Admitting: *Deleted

## 2018-01-06 DIAGNOSIS — C9110 Chronic lymphocytic leukemia of B-cell type not having achieved remission: Secondary | ICD-10-CM

## 2018-01-12 ENCOUNTER — Inpatient Hospital Stay: Payer: Medicare Other

## 2018-01-12 DIAGNOSIS — D649 Anemia, unspecified: Secondary | ICD-10-CM | POA: Diagnosis not present

## 2018-01-12 DIAGNOSIS — E1122 Type 2 diabetes mellitus with diabetic chronic kidney disease: Secondary | ICD-10-CM | POA: Diagnosis not present

## 2018-01-12 DIAGNOSIS — D693 Immune thrombocytopenic purpura: Secondary | ICD-10-CM | POA: Diagnosis not present

## 2018-01-12 DIAGNOSIS — C9110 Chronic lymphocytic leukemia of B-cell type not having achieved remission: Secondary | ICD-10-CM

## 2018-01-12 DIAGNOSIS — N183 Chronic kidney disease, stage 3 (moderate): Secondary | ICD-10-CM | POA: Diagnosis not present

## 2018-01-12 DIAGNOSIS — I129 Hypertensive chronic kidney disease with stage 1 through stage 4 chronic kidney disease, or unspecified chronic kidney disease: Secondary | ICD-10-CM | POA: Diagnosis not present

## 2018-01-12 LAB — CBC WITH DIFFERENTIAL/PLATELET
Abs Immature Granulocytes: 0.07 10*3/uL (ref 0.00–0.07)
BASOS ABS: 0 10*3/uL (ref 0.0–0.1)
BASOS PCT: 1 %
EOS PCT: 4 %
Eosinophils Absolute: 0.2 10*3/uL (ref 0.0–0.5)
HEMATOCRIT: 30.5 % — AB (ref 39.0–52.0)
Hemoglobin: 9.7 g/dL — ABNORMAL LOW (ref 13.0–17.0)
IMMATURE GRANULOCYTES: 1 %
LYMPHS PCT: 66 %
Lymphs Abs: 3.9 10*3/uL (ref 0.7–4.0)
MCH: 31.7 pg (ref 26.0–34.0)
MCHC: 31.8 g/dL (ref 30.0–36.0)
MCV: 99.7 fL (ref 80.0–100.0)
MONO ABS: 0.3 10*3/uL (ref 0.1–1.0)
Monocytes Relative: 5 %
NEUTROS ABS: 1.4 10*3/uL — AB (ref 1.7–7.7)
NRBC: 0.5 % — AB (ref 0.0–0.2)
Neutrophils Relative %: 23 %
PLATELETS: 27 10*3/uL — AB (ref 150–400)
RBC: 3.06 MIL/uL — AB (ref 4.22–5.81)
RDW: 15.1 % (ref 11.5–15.5)
WBC: 5.9 10*3/uL (ref 4.0–10.5)

## 2018-01-12 MED ORDER — ROMIPLOSTIM 250 MCG ~~LOC~~ SOLR
1.0000 ug/kg | Freq: Once | SUBCUTANEOUS | Status: AC
  Administered 2018-01-12: 70 ug via SUBCUTANEOUS
  Filled 2018-01-12: qty 0.14

## 2018-01-21 LAB — MISC LABCORP TEST (SEND OUT): LABCORP TEST CODE: 113753

## 2018-01-22 ENCOUNTER — Encounter: Payer: Self-pay | Admitting: Family Medicine

## 2018-01-22 ENCOUNTER — Ambulatory Visit (INDEPENDENT_AMBULATORY_CARE_PROVIDER_SITE_OTHER): Payer: Medicare Other | Admitting: Family Medicine

## 2018-01-22 VITALS — BP 120/72 | HR 66 | Temp 97.8°F | Ht 71.0 in | Wt 151.0 lb

## 2018-01-22 DIAGNOSIS — Z23 Encounter for immunization: Secondary | ICD-10-CM | POA: Diagnosis not present

## 2018-01-22 DIAGNOSIS — E7849 Other hyperlipidemia: Secondary | ICD-10-CM | POA: Diagnosis not present

## 2018-01-22 DIAGNOSIS — I2510 Atherosclerotic heart disease of native coronary artery without angina pectoris: Secondary | ICD-10-CM

## 2018-01-22 DIAGNOSIS — E1165 Type 2 diabetes mellitus with hyperglycemia: Secondary | ICD-10-CM

## 2018-01-22 DIAGNOSIS — E785 Hyperlipidemia, unspecified: Secondary | ICD-10-CM | POA: Diagnosis not present

## 2018-01-22 DIAGNOSIS — E1121 Type 2 diabetes mellitus with diabetic nephropathy: Secondary | ICD-10-CM | POA: Diagnosis not present

## 2018-01-22 DIAGNOSIS — IMO0002 Reserved for concepts with insufficient information to code with codable children: Secondary | ICD-10-CM

## 2018-01-22 DIAGNOSIS — E1142 Type 2 diabetes mellitus with diabetic polyneuropathy: Secondary | ICD-10-CM

## 2018-01-22 DIAGNOSIS — C9110 Chronic lymphocytic leukemia of B-cell type not having achieved remission: Secondary | ICD-10-CM | POA: Diagnosis not present

## 2018-01-22 DIAGNOSIS — N183 Chronic kidney disease, stage 3 unspecified: Secondary | ICD-10-CM

## 2018-01-22 DIAGNOSIS — I10 Essential (primary) hypertension: Secondary | ICD-10-CM | POA: Diagnosis not present

## 2018-01-22 DIAGNOSIS — D693 Immune thrombocytopenic purpura: Secondary | ICD-10-CM

## 2018-01-22 LAB — POCT GLYCOSYLATED HEMOGLOBIN (HGB A1C): HEMOGLOBIN A1C: 8.1 % — AB (ref 4.0–5.6)

## 2018-01-22 LAB — MISC LABCORP TEST (SEND OUT): Labcorp test code: 510340

## 2018-01-22 MED ORDER — INSULIN ASPART 100 UNIT/ML FLEXPEN: 5.0000 [IU] | PEN_INJECTOR | Freq: Three times a day (TID) | SUBCUTANEOUS | 3 refills | Status: DC

## 2018-01-22 MED ORDER — TRAZODONE HCL 50 MG PO TABS: 25.0000 mg | ORAL_TABLET | Freq: Every evening | ORAL | 1 refills | Status: DC | PRN

## 2018-01-22 MED ORDER — ATORVASTATIN CALCIUM 80 MG PO TABS: 80.0000 mg | ORAL_TABLET | Freq: Every day | ORAL | 3 refills | Status: DC

## 2018-01-22 MED ORDER — LISINOPRIL 10 MG PO TABS: 10.0000 mg | ORAL_TABLET | Freq: Every day | ORAL | 1 refills | Status: DC

## 2018-01-22 MED ORDER — INSULIN DETEMIR 100 UNIT/ML FLEXPEN: PEN_INJECTOR | SUBCUTANEOUS | 3 refills | Status: DC

## 2018-01-22 NOTE — Assessment & Plan Note (Signed)
Chronic, stable. Continue current regimen. 

## 2018-01-22 NOTE — Assessment & Plan Note (Signed)
Chronic brittle diabetes - no lows recently, but sugars ranging 70-200s. I did ask for closer sugar checks over next 2 days and drop off log to review, titrate insulin accordingly. Anticipate sugar fluctuations largely related to diet and lack of routine mealtimes.  He would be good candidate for CGM, however doesn't currently check QID regularly.

## 2018-01-22 NOTE — Progress Notes (Signed)
BP 120/72 (BP Location: Left Arm, Patient Position: Sitting, Cuff Size: Normal)   Pulse 66   Temp 97.8 F (36.6 C) (Oral)   Ht 5\' 11"  (1.803 m)   Wt 151 lb (68.5 kg)   SpO2 96%   BMI 21.06 kg/m    CC: f/u visit DM Subjective:    Patient ID: Scott [name], male    DOB: 12-17-1940, 77 y.o.   MRN: [id number]  HPI: Scott [name] is a 77 y.o. male presenting on 01/22/2018 for Follow-up (Here for 3 mo f/u. Pt provided copy (to keep) of recent BP and BS readings. )   Dog scratched L dorsal hand this week. Treating with neosporin.   EGD/colonoscopy 11/2017 - nonbleeding erosive gastropathy with multiple duodenal ulcers. H pylori negative. 2 colon polyps were also removed. Started on protonix 40mg  bid for at least 3 mo, rec rpt colonoscopy in 5 yrs.   CLL - platelets markedly dropping started on Nplate injections. Planned f/u next month. He had bone marrow and bone biopsy - returned ok.   He's started drinking glucerna shakes to stimulate appetite.  DM - does regularly check sugars and brings log which was reviewed: Marland Kitchen Compliant with antihyperglycemic regimen which includes: novolog 5-8u with meals, levemir 25u at bedtime. Appetite is improving but he doesn't have a scheduled meal routine. Regularly eats breakfast, less regular with lunch and dinner, does have snacks. He has planned dietician appt coming up with onc 02/02/2018. Denies low sugars or hypoglycemic symptoms. Denies paresthesias. Last diabetic eye exam 03/2017. Pneumovax: 2013. Prevnar: 2015. Glucometer brand: onetouch. DSME: 2015 refresher course. Lab Results  Component Value Date   HGBA1C 8.1 (A) 01/22/2018   Diabetic Foot Exam - Simple   No data filed     Lab Results  Component Value Date   MICROALBUR 2.3 (H) 02/01/2015     HTN - Compliant with current antihypertensive regimen of lasix 20mg  daily, carvedilol 6.25mg  bid, lisinopril 10mg  daily.  Does check blood pressures at home: bp log reviewed 106-155/50-70.  No low  blood pressure readings or symptoms of dizziness/syncope.  Denies HA, vision changes, CP/tightness, SOB, leg swelling.    Relevant past medical, surgical, family and social history reviewed and updated as indicated. Interim medical history since our last visit reviewed. Allergies and medications reviewed and updated. Outpatient Medications Prior to Visit  Medication Sig Dispense Refill  . acetaminophen (TYLENOL) 650 MG CR tablet Take 650 mg by mouth every 8 (eight) hours as needed for pain.    Marland Kitchen aspirin EC 81 MG tablet Take 1 tablet (81 mg total) by mouth daily. 90 tablet 3  . carvedilol (COREG) 6.25 MG tablet TAKE 1 TABLET BY MOUTH TWICE DAILY 180 tablet 3  . glucose blood (ONE TOUCH ULTRA TEST) test strip 1 each by Other route 4 (four) times daily. and as needed to check sugars Dx code:  E11.21 400 each 3  . levothyroxine (SYNTHROID, LEVOTHROID) 50 MCG tablet TAKE 1 TABLET BY MOUTH ONCE DAILY 90 tablet 2  . niacin (SLO-NIACIN) 500 MG tablet Take 1,000 mg by mouth at bedtime.     . nitroGLYCERIN (NITROSTAT) 0.4 MG SL tablet Place 1 tablet (0.4 mg total) under the tongue every 5 (five) minutes as needed for chest pain ((MAX of 3 doses)). 25 tablet 0  . pantoprazole (PROTONIX) 40 MG tablet Take 1 tablet (40 mg total) by mouth 2 (two) times daily before a meal. 180 tablet 0  . vitamin B-12 1000 MCG tablet  Take 1 tablet (1,000 mcg total) by mouth daily. 120 tablet 0  . atorvastatin (LIPITOR) 80 MG tablet TAKE 1 TABLET BY MOUTH ONCE DAILY 90 tablet 3  . insulin aspart (NOVOLOG) 100 UNIT/ML FlexPen Inject 5 Units into the skin 3 (three) times daily with meals. (3-7 units) 15 mL 1  . LEVEMIR FLEXTOUCH 100 UNIT/ML Pen INJECT 25 UNITS SUBCUTANEOUSLY ONCE DAILY AT  10PM 15 pen 3  . lisinopril (PRINIVIL,ZESTRIL) 10 MG tablet Take 1 tablet (10 mg total) by mouth daily. 90 tablet 1  . traZODone (DESYREL) 50 MG tablet Take 0.5-1 tablets (25-50 mg total) by mouth at bedtime as needed for sleep. 90 tablet 1  .  furosemide (LASIX) 20 MG tablet Take 1 tablet (20 mg total) by mouth daily. 30 tablet 4  . Continuous Blood Gluc Receiver (FREESTYLE LIBRE 14 DAY READER) DEVI 1 Device by Does not apply route as directed. 1 Device 0  . Continuous Blood Gluc Sensor (FREESTYLE LIBRE 14 DAY SENSOR) MISC 1 Device by Does not apply route every 14 (fourteen) days. 6 each 3  . oxyCODONE-acetaminophen (PERCOCET/ROXICET) 5-325 MG tablet Take 1 tablet by mouth every 4 (four) hours as needed for severe pain. (Patient not taking: Reported on 12/05/2017) 30 tablet 0  . triamcinolone cream (KENALOG) 0.1 % Apply 1 application topically 2 (two) times daily. Apply to AA. 45 g 0   No facility-administered medications prior to visit.      Per HPI unless specifically indicated in ROS section below Review of Systems     Objective:    BP 120/72 (BP Location: Left Arm, Patient Position: Sitting, Cuff Size: Normal)   Pulse 66   Temp 97.8 F (36.6 C) (Oral)   Ht 5\' 11"  (1.803 m)   Wt 151 lb (68.5 kg)   SpO2 96%   BMI 21.06 kg/m   Wt Readings from Last 3 Encounters:  01/22/18 151 lb (68.5 kg)  01/05/18 154 lb 4 oz (70 kg)  12/05/17 154 lb 9.6 oz (70.1 kg)    Physical Exam  Constitutional: He appears well-developed and well-nourished. No distress.  HENT:  Head: Normocephalic and atraumatic.  Right Ear: External ear normal.  Left Ear: External ear normal.  Nose: Nose normal.  Mouth/Throat: Oropharynx is clear and moist. No oropharyngeal exudate.  Eyes: Pupils are equal, round, and reactive to light. Conjunctivae and EOM are normal. No scleral icterus.  Neck: Normal range of motion. Neck supple.  Cardiovascular: Normal rate, regular rhythm, normal heart sounds and intact distal pulses.  No murmur heard. Pulmonary/Chest: Effort normal and breath sounds normal. No respiratory distress. He has no wheezes. He has no rales.  Musculoskeletal: He exhibits no edema.  See HPI for foot exam if done Chronic acquired R hand  deformity   Lymphadenopathy:    He has no cervical adenopathy.  Skin: Skin is warm and dry. No rash noted.  Laceration L dorsal hand from dog scratch with mild surrounding erythema  Psychiatric: He has a normal mood and affect.  Nursing note and vitals reviewed.  Results for orders placed or performed in visit on 01/22/18  POCT glycosylated hemoglobin (Hb A1C)  Result Value Ref Range   Hemoglobin A1C 8.1 (A) 4.0 - 5.6 %   HbA1c POC (<> result, manual entry)     HbA1c, POC (prediabetic range)     HbA1c, POC (controlled diabetic range)        Assessment & Plan:  Upcoming physical next month Problem List Items Addressed This  Visit    Uncontrolled type 2 diabetes mellitus with nephropathy ([place]) - Primary    Chronic brittle diabetes - no lows recently, but sugars ranging 70-200s. I did ask for closer sugar checks over next 2 days and drop off log to review, titrate insulin accordingly. Anticipate sugar fluctuations largely related to diet and lack of routine mealtimes.  He would be good candidate for CGM, however doesn't currently check QID regularly.       Relevant Medications   atorvastatin (LIPITOR) 80 MG tablet   insulin aspart (NOVOLOG) 100 UNIT/ML FlexPen   Insulin Detemir (LEVEMIR FLEXTOUCH) 100 UNIT/ML Pen   lisinopril (PRINIVIL,ZESTRIL) 10 MG tablet   Other Relevant Orders   POCT glycosylated hemoglobin (Hb A1C) (Completed)   Idiopathic thrombocytopenic purpura (ITP) (HCC)   Hypertension    Chronic, stable. Continue current regimen.       Relevant Medications   atorvastatin (LIPITOR) 80 MG tablet   lisinopril (PRINIVIL,ZESTRIL) 10 MG tablet   HLD (hyperlipidemia)   Relevant Medications   atorvastatin (LIPITOR) 80 MG tablet   lisinopril (PRINIVIL,ZESTRIL) 10 MG tablet   Diabetic neuropathy (HCC)   Relevant Medications   atorvastatin (LIPITOR) 80 MG tablet   insulin aspart (NOVOLOG) 100 UNIT/ML FlexPen   Insulin Detemir (LEVEMIR FLEXTOUCH) 100 UNIT/ML Pen    lisinopril (PRINIVIL,ZESTRIL) 10 MG tablet   CLL (chronic lymphocytic leukemia) (HCC)    Appreciate onc care. Sees regularly.       CKD (chronic kidney disease) stage 3, GFR 30-59 ml/min (HCC)   CAD (coronary artery disease)   Relevant Medications   atorvastatin (LIPITOR) 80 MG tablet   lisinopril (PRINIVIL,ZESTRIL) 10 MG tablet    Other Visit Diagnoses    Need for influenza vaccination       Relevant Orders   Flu Vaccine QUAD 36+ mos IM (Completed)       Meds ordered this encounter  Medications  . atorvastatin (LIPITOR) 80 MG tablet    Sig: Take 1 tablet (80 mg total) by mouth daily.    Dispense:  90 tablet    Refill:  3  . insulin aspart (NOVOLOG) 100 UNIT/ML FlexPen    Sig: Inject 5 Units into the skin 3 (three) times daily with meals. (3-7 units)    Dispense:  15 mL    Refill:  3  . Insulin Detemir (LEVEMIR FLEXTOUCH) 100 UNIT/ML Pen    Sig: INJECT 25 UNITS SUBCUTANEOUSLY ONCE DAILY AT  10PM    Dispense:  15 pen    Refill:  3    Please consider 90 day supplies to promote better adherence  . lisinopril (PRINIVIL,ZESTRIL) 10 MG tablet    Sig: Take 1 tablet (10 mg total) by mouth daily.    Dispense:  90 tablet    Refill:  1  . traZODone (DESYREL) 50 MG tablet    Sig: Take 0.5-1 tablets (25-50 mg total) by mouth at bedtime as needed for sleep.    Dispense:  90 tablet    Refill:  1    Please consider 90 day supplies to promote better adherence   Orders Placed This Encounter  Procedures  . Flu Vaccine QUAD 36+ mos IM  . POCT glycosylated hemoglobin (Hb A1C)    Follow up plan: No follow-ups on file.  [name], MD

## 2018-01-22 NOTE — Patient Instructions (Addendum)
Flu shot today You are doing well today Continue keeping track of sugars, but for 1-2 days this week, use my log to jot down numbers before a meal, 2 hours after a meal, time of meal, and how much insulin you gave yourself for that meal.  Return in 3 months for follow up visit.

## 2018-01-22 NOTE — Assessment & Plan Note (Signed)
Appreciate onc care. Sees regularly. 

## 2018-01-28 ENCOUNTER — Telehealth: Payer: Self-pay | Admitting: Family Medicine

## 2018-01-28 NOTE — Telephone Encounter (Signed)
plz call - I reviewed sugar log he dropped off. Thank you. It looks like he is sometimes giving himself novolog shots every 2-3 hours if sugars remain high on recheck - I don't want him doing this, only do novolog shots with meals three times a day, not at other times. Shouldn't do novolog injection more frequently than Q4 hours as it can build up in system and lead to too low sugars.  We may need to change around how he does novolog. What is his largest meal of the day?

## 2018-01-28 NOTE — Telephone Encounter (Signed)
Spoke with pt relaying Dr. Synthia Innocent instructions.  Pt verbalizes understanding and states breakfast is usually his largest meal of the day.

## 2018-02-01 NOTE — Progress Notes (Signed)
Scott [name]  [phone] Fax:([phone]  ID: Scott [name] OB: 07/09/1940  MR#: [id number]  ATF#:[id number]  Patient Care Team: [name], MD as PCP - General (Family Medicine) Lloyd Huger, MD as Consulting Physician (Oncology) Clent Jacks, MD as Consulting Physician (Ophthalmology) Dorothy Spark, MD as Consulting Physician (Cardiology) Madelon Lips, MD as Consulting Physician (Nephrology)  CHIEF COMPLAINT: 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  INTERVAL HISTORY: Patient returns to clinic today for repeat laboratory, further evaluation, and initiation of Nplate.  He continues to feel well and remains asymptomatic. He denies any easy bleeding or bruising. He has no neurologic complaints.  He has a good appetite and denies weight loss. He denies any night sweats. He has noted no lymphadenopathy. He has no chest pain or shortness of breath. He denies any nausea, vomiting, constipation, or diarrhea. He has no urinary complaints.  Patient feels at his baseline offers no specific complaints today.  REVIEW OF SYSTEMS:   Review of Systems  Constitutional: Negative.  Negative for fever, malaise/fatigue and weight loss.  Respiratory: Negative.  Negative for cough and shortness of breath.   Cardiovascular: Negative.  Negative for chest pain and leg swelling.  Gastrointestinal: Negative.  Negative for abdominal pain and blood in stool.  Genitourinary: Negative.  Negative for dysuria and hematuria.  Musculoskeletal: Negative.  Negative for back pain and neck pain.  Skin: Negative.  Negative for itching and rash.  Neurological: Negative.  Negative for sensory change, focal weakness, weakness and headaches.  Endo/Heme/Allergies: Does not bruise/bleed easily.  Psychiatric/Behavioral: Negative.  The patient is not nervous/anxious.     As per HPI. Otherwise, a complete review of systems is negative.  PAST MEDICAL HISTORY: Past  Medical History:  Diagnosis Date  . Acquired hand deformity 1962   hand saw accident at work  . Anemia 10/11/2011  . Arthritis   . Bradycardia 10/10/2011  . CAD (coronary artery disease) 10/10/2011   MI s/p PTCA (Dx-OM2 proximal concentric stenosis)  . CKD (chronic kidney disease) stage 3, GFR 30-59 ml/min (HCC)    [name]  . CLL (chronic lymphocytic leukemia) ([facility])   . Colon polyps   . Generalized headaches    frequent  . GI bleed 07/08/2017  . Glaucoma    s/p laser surgery  . HLD (hyperlipidemia)   . Hypertension   . Hypothyroidism 10/10/2011  . Thrombocytopenia ([place]) 10/11/2011  . Type 2 diabetes with nephropathy [name])    DM refresher course [facility] (04/2013)    PAST SURGICAL HISTORY: Past Surgical History:  Procedure Laterality Date  . BACK SURGERY     cervical neck  . CATARACT EXTRACTION, BILATERAL Bilateral 2017  . COLONOSCOPY  11/2008   1 polyp, diverticulosis, rec rpt 5 yrs (Dr. Oletta Lamas, [name])  . COLONOSCOPY  06/2014   hyperplastic polyp, rpt 5 yrs ([facility])  . COLONOSCOPY WITH PROPOFOL N/A 11/17/2017   TA, HP, (Vanga, Tally Due, MD)  . ESOPHAGOGASTRODUODENOSCOPY (EGD) WITH PROPOFOL N/A 11/17/2017   healing erosive gastritis, intestinal metaplasia, neg H pylori (Vanga, Tally Due, MD)  . EYE SURGERY  2012   laser surgery for glaucoma  . PTCA  1994, 1995  . US ECHOCARDIOGRAPHY  10/2013   inferior wall hypokinesis, mild LVH, EF 50-55%, mild MR and LA dilation    FAMILY HISTORY: Family History  Problem Relation Age of Onset  . Diabetes Sister   . Stomach cancer Sister 66  . Pneumonia Father  caused death  . Other Brother        no communication with brother so unsure of any health conditions  . Coronary artery disease Son 25       5v CABG and stents  . Hyperlipidemia Sister   . Stroke Neg Hx   . Heart attack Neg Hx     ADVANCED DIRECTIVES (Y/N):  N  HEALTH MAINTENANCE: Social History   Tobacco Use  . Smoking status: Former Smoker    Last attempt to  quit: 03/04/1978    Years since quitting: 39.9  . Smokeless tobacco: Never Used  Substance Use Topics  . Alcohol use: No  . Drug use: No     Colonoscopy:  PAP:  Bone density:  Lipid panel:  No Known Allergies  Current Outpatient Medications  Medication Sig Dispense Refill  . acetaminophen (TYLENOL) 650 MG CR tablet Take 650 mg by mouth every 8 (eight) hours as needed for pain.    Marland Kitchen aspirin EC 81 MG tablet Take 1 tablet (81 mg total) by mouth daily. 90 tablet 3  . atorvastatin (LIPITOR) 80 MG tablet Take 1 tablet (80 mg total) by mouth daily. 90 tablet 3  . carvedilol (COREG) 6.25 MG tablet TAKE 1 TABLET BY MOUTH TWICE DAILY 180 tablet 3  . furosemide (LASIX) 20 MG tablet Take 1 tablet (20 mg total) by mouth daily. 30 tablet 4  . glucose blood (ONE TOUCH ULTRA TEST) test strip 1 each by Other route 4 (four) times daily. and as needed to check sugars Dx code:  E11.21 400 each 3  . insulin aspart (NOVOLOG) 100 UNIT/ML FlexPen Inject 5 Units into the skin 3 (three) times daily with meals. (3-7 units) 15 mL 3  . Insulin Detemir (LEVEMIR FLEXTOUCH) 100 UNIT/ML Pen INJECT 25 UNITS SUBCUTANEOUSLY ONCE DAILY AT  10PM 15 pen 3  . levothyroxine (SYNTHROID, LEVOTHROID) 50 MCG tablet TAKE 1 TABLET BY MOUTH ONCE DAILY 90 tablet 2  . lisinopril (PRINIVIL,ZESTRIL) 10 MG tablet Take 1 tablet (10 mg total) by mouth daily. 90 tablet 1  . niacin (SLO-NIACIN) 500 MG tablet Take 1,000 mg by mouth at bedtime.     . nitroGLYCERIN (NITROSTAT) 0.4 MG SL tablet Place 1 tablet (0.4 mg total) under the tongue every 5 (five) minutes as needed for chest pain ((MAX of 3 doses)). 25 tablet 0  . pantoprazole (PROTONIX) 40 MG tablet Take 1 tablet (40 mg total) by mouth 2 (two) times daily before a meal. 180 tablet 0  . traZODone (DESYREL) 50 MG tablet Take 0.5-1 tablets (25-50 mg total) by mouth at bedtime as needed for sleep. 90 tablet 1  . vitamin B-12 1000 MCG tablet Take 1 tablet (1,000 mcg total) by mouth daily.  (Patient not taking: Reported on 02/02/2018) 120 tablet 0   No current facility-administered medications for this visit.     OBJECTIVE: There were no vitals filed for this visit.   There is no height or weight on file to calculate BMI.    ECOG FS:0 - Asymptomatic  General: Well-developed, well-nourished, no acute distress. Eyes: Pink conjunctiva, anicteric sclera. HEENT: Normocephalic, moist mucous membranes. Lungs: Clear to auscultation bilaterally. Heart: Regular rate and rhythm. No rubs, murmurs, or gallops. Abdomen: Soft, nontender, nondistended. No organomegaly noted, normoactive bowel sounds. Musculoskeletal: No edema, cyanosis, or clubbing. Neuro: Alert, answering all questions appropriately. Cranial nerves grossly intact. Skin: No rashes or petechiae noted. Psych: Normal affect.  LAB RESULTS:  Lab Results  Component Value Date  NA 140 08/23/2017   K 4.8 08/23/2017   CL 105 08/23/2017   CO2 24 08/23/2017   GLUCOSE 324 (H) 08/23/2017   BUN 33 (H) 08/23/2017   CREATININE 1.75 (H) 08/23/2017   CALCIUM 9.5 08/23/2017   PROT 6.3 (L) 08/23/2017   ALBUMIN 3.8 08/23/2017   AST 15 08/23/2017   ALT 14 (L) 08/23/2017   ALKPHOS 65 08/23/2017   BILITOT 0.8 08/23/2017   GFRNONAA 36 (L) 08/23/2017   GFRAA 42 (L) 08/23/2017    Lab Results  Component Value Date   WBC 8.5 02/02/2018   NEUTROABS 2.3 02/02/2018   HGB 10.2 (L) 02/02/2018   HCT 32.3 (L) 02/02/2018   MCV 99.7 02/02/2018   PLT 37 (L) 02/02/2018     STUDIES: No results found.  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  PLAN:    1. CLL: Bone marrow biopsy completed on October 23, 2017 reviewed independently with involvement with CLL, unclear percentage of involvement.  Although patient was reported to have normal cytogenetics he was noted to have a 50% incidence of mutation in the ATM gene which is commonly associated with deletion 11q. 11q- is associated with an unfavorable prognosis and  high risk of not responding to initial treatment. 13q-is a more common mutation and is actually associated with a more favorable prognosis. Patient's white blood cell count continues to be within normal limits.  He received his last infusion of single agent Rituxan on September 03, 2017.  No further intervention is needed.  If patient requires additional treatment could possibly transition him to [place].   2.  Thrombocytopenia: Patient's platelet count has slightly improved and are now 37. 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  Patient has received high-dose prednisone, IVIG, and Rituxan with no appreciable durability to improve his platelet count.  Proceed with Nplate injection today.  Return to clinic in 2 weeks for laboratory work only and then in 4 weeks for laboratory work, further evaluation, and continuation of Nplate.  3.  Anemia: Decreased and essentially stable at 10.2.  Patient had colonoscopy on November 17, 2017 that removed 2 nonmalignant polyps.  EGD on the same day revealed nonbleeding erosive gastropathy with multiple nonbleeding duodenal ulcers.   4.  Reaction to Rituxan: Rate-based.  Patient will require premedications for any further infusions with Rituxan.    I spent a total of 30 minutes face-to-face with the patient of which greater than 50% of the visit was spent in counseling and coordination of care as detailed above.   Patient expressed understanding and was in agreement with this plan. He also understands that He can call clinic at any time with any questions, concerns, or complaints.    Lloyd Huger, MD   02/03/2018 2:14 PM

## 2018-02-01 NOTE — Telephone Encounter (Signed)
Ok - would have him just do novolog 5u with meals, check sugars before and 2 hours after a few times over the next few days and drop off another log if he can prior to next visit.

## 2018-02-02 ENCOUNTER — Inpatient Hospital Stay: Payer: Medicare Other

## 2018-02-02 ENCOUNTER — Other Ambulatory Visit: Payer: Self-pay

## 2018-02-02 ENCOUNTER — Inpatient Hospital Stay (HOSPITAL_BASED_OUTPATIENT_CLINIC_OR_DEPARTMENT_OTHER): Payer: Medicare Other | Admitting: Oncology

## 2018-02-02 ENCOUNTER — Inpatient Hospital Stay: Payer: Medicare Other | Attending: Oncology

## 2018-02-02 DIAGNOSIS — N183 Chronic kidney disease, stage 3 (moderate): Secondary | ICD-10-CM

## 2018-02-02 DIAGNOSIS — I2510 Atherosclerotic heart disease of native coronary artery without angina pectoris: Secondary | ICD-10-CM | POA: Diagnosis not present

## 2018-02-02 DIAGNOSIS — E1122 Type 2 diabetes mellitus with diabetic chronic kidney disease: Secondary | ICD-10-CM | POA: Diagnosis not present

## 2018-02-02 DIAGNOSIS — I129 Hypertensive chronic kidney disease with stage 1 through stage 4 chronic kidney disease, or unspecified chronic kidney disease: Secondary | ICD-10-CM

## 2018-02-02 DIAGNOSIS — D649 Anemia, unspecified: Secondary | ICD-10-CM | POA: Diagnosis not present

## 2018-02-02 DIAGNOSIS — D693 Immune thrombocytopenic purpura: Secondary | ICD-10-CM

## 2018-02-02 DIAGNOSIS — E1121 Type 2 diabetes mellitus with diabetic nephropathy: Secondary | ICD-10-CM

## 2018-02-02 DIAGNOSIS — C9110 Chronic lymphocytic leukemia of B-cell type not having achieved remission: Secondary | ICD-10-CM | POA: Diagnosis not present

## 2018-02-02 DIAGNOSIS — Z87891 Personal history of nicotine dependence: Secondary | ICD-10-CM | POA: Insufficient documentation

## 2018-02-02 DIAGNOSIS — Z79899 Other long term (current) drug therapy: Secondary | ICD-10-CM | POA: Insufficient documentation

## 2018-02-02 LAB — CBC WITH DIFFERENTIAL/PLATELET
Abs Immature Granulocytes: 0.07 10*3/uL (ref 0.00–0.07)
BASOS ABS: 0 10*3/uL (ref 0.0–0.1)
BASOS PCT: 1 %
EOS ABS: 0.3 10*3/uL (ref 0.0–0.5)
EOS PCT: 4 %
HCT: 32.3 % — ABNORMAL LOW (ref 39.0–52.0)
HEMOGLOBIN: 10.2 g/dL — AB (ref 13.0–17.0)
Immature Granulocytes: 1 %
LYMPHS ABS: 5.4 10*3/uL — AB (ref 0.7–4.0)
Lymphocytes Relative: 63 %
MCH: 31.5 pg (ref 26.0–34.0)
MCHC: 31.6 g/dL (ref 30.0–36.0)
MCV: 99.7 fL (ref 80.0–100.0)
Monocytes Absolute: 0.4 10*3/uL (ref 0.1–1.0)
Monocytes Relative: 4 %
NEUTROS PCT: 27 %
NRBC: 0.2 % (ref 0.0–0.2)
Neutro Abs: 2.3 10*3/uL (ref 1.7–7.7)
Platelets: 37 10*3/uL — ABNORMAL LOW (ref 150–400)
RBC: 3.24 MIL/uL — ABNORMAL LOW (ref 4.22–5.81)
RDW: 14.3 % (ref 11.5–15.5)
WBC: 8.5 10*3/uL (ref 4.0–10.5)

## 2018-02-02 MED ORDER — ROMIPLOSTIM 250 MCG ~~LOC~~ SOLR
1.0000 ug/kg | Freq: Once | SUBCUTANEOUS | Status: AC
  Administered 2018-02-02: 70 ug via SUBCUTANEOUS
  Filled 2018-02-02: qty 0.14

## 2018-02-02 NOTE — Telephone Encounter (Signed)
Spoke with pt relaying Dr. G's instructions.  Pt verbalizes understanding.  

## 2018-02-02 NOTE — Progress Notes (Signed)
Patient is here today for follow up for CLL. Patient denies nausea, vomiting, constipation and diarrhea. Patient's appetite is not so good. He takes Glucerna in the morning and at night time to help him with his nutrition.

## 2018-02-02 NOTE — Progress Notes (Signed)
Nutrition  Patient was scheduled to see RD today for nutrition assessment.  Noted appointment was cancelled due to "patient doing what he is suppose to be doing and did not need to see RD."  Medical oncology to refer back to RD if needed in the future.  Kynlea Blackston B. Zenia Resides, [place], [place] Registered Dietitian [phone] (pager)

## 2018-02-04 ENCOUNTER — Telehealth: Payer: Self-pay | Admitting: Family Medicine

## 2018-02-04 NOTE — Telephone Encounter (Signed)
Placed readings in Dr. Synthia Innocent box.

## 2018-02-04 NOTE — Telephone Encounter (Signed)
Best number [phone]  Pt dropped of blood sugar readings In dr g rx tower up front

## 2018-02-06 NOTE — Telephone Encounter (Signed)
Reviewed sugar log. Continue 5 units with meals.  Work on mealtime routine/schedule.  I believe he's only eating 2 meals a day? He has been checking sugars QID recently - would he be interested in getting continuous glucose meter for more easy titration of insulin?

## 2018-02-07 ENCOUNTER — Encounter: Payer: Self-pay | Admitting: Oncology

## 2018-02-09 NOTE — Telephone Encounter (Signed)
Spoke with pt relaying Dr. Synthia Innocent message. Pt verbalizes understanding.  Says he checks BS 3-6 times daily.  Pt says he would like to try the continuous meter.

## 2018-02-12 MED ORDER — FREESTYLE LIBRE 14 DAY SENSOR MISC: 1.0000 | 3 refills | Status: DC

## 2018-02-12 MED ORDER — FREESTYLE LIBRE 14 DAY READER DEVI: 1.0000 | 0 refills | Status: DC

## 2018-02-12 NOTE — Addendum Note (Signed)
Addended by: [name] on: 02/12/2018 12:23 PM   Modules accepted: Orders

## 2018-02-12 NOTE — Telephone Encounter (Signed)
Meter ordered

## 2018-02-16 ENCOUNTER — Inpatient Hospital Stay: Payer: Medicare Other

## 2018-02-16 ENCOUNTER — Other Ambulatory Visit: Payer: Self-pay

## 2018-02-16 DIAGNOSIS — C9110 Chronic lymphocytic leukemia of B-cell type not having achieved remission: Secondary | ICD-10-CM

## 2018-02-16 DIAGNOSIS — E1122 Type 2 diabetes mellitus with diabetic chronic kidney disease: Secondary | ICD-10-CM | POA: Diagnosis not present

## 2018-02-16 DIAGNOSIS — I129 Hypertensive chronic kidney disease with stage 1 through stage 4 chronic kidney disease, or unspecified chronic kidney disease: Secondary | ICD-10-CM | POA: Diagnosis not present

## 2018-02-16 DIAGNOSIS — D693 Immune thrombocytopenic purpura: Secondary | ICD-10-CM | POA: Diagnosis not present

## 2018-02-16 DIAGNOSIS — D649 Anemia, unspecified: Secondary | ICD-10-CM | POA: Diagnosis not present

## 2018-02-16 DIAGNOSIS — N183 Chronic kidney disease, stage 3 (moderate): Secondary | ICD-10-CM | POA: Diagnosis not present

## 2018-02-16 LAB — CBC WITH DIFFERENTIAL/PLATELET
Abs Immature Granulocytes: 0.08 10*3/uL — ABNORMAL HIGH (ref 0.00–0.07)
BASOS PCT: 0 %
Basophils Absolute: 0 10*3/uL (ref 0.0–0.1)
Eosinophils Absolute: 0.2 10*3/uL (ref 0.0–0.5)
Eosinophils Relative: 3 %
HCT: 26.3 % — ABNORMAL LOW (ref 39.0–52.0)
Hemoglobin: 8.3 g/dL — ABNORMAL LOW (ref 13.0–17.0)
Immature Granulocytes: 1 %
Lymphocytes Relative: 67 %
Lymphs Abs: 4.2 10*3/uL — ABNORMAL HIGH (ref 0.7–4.0)
MCH: 31.8 pg (ref 26.0–34.0)
MCHC: 31.6 g/dL (ref 30.0–36.0)
MCV: 100.8 fL — ABNORMAL HIGH (ref 80.0–100.0)
Monocytes Absolute: 0.5 10*3/uL (ref 0.1–1.0)
Monocytes Relative: 8 %
Neutro Abs: 1.3 10*3/uL — ABNORMAL LOW (ref 1.7–7.7)
Neutrophils Relative %: 21 %
Platelets: 30 10*3/uL — ABNORMAL LOW (ref 150–400)
RBC: 2.61 MIL/uL — ABNORMAL LOW (ref 4.22–5.81)
RDW: 14.7 % (ref 11.5–15.5)
WBC: 6.2 10*3/uL (ref 4.0–10.5)
nRBC: 0 % (ref 0.0–0.2)

## 2018-02-19 ENCOUNTER — Other Ambulatory Visit: Payer: Self-pay | Admitting: Family Medicine

## 2018-02-19 ENCOUNTER — Encounter: Payer: Self-pay | Admitting: Oncology

## 2018-02-19 DIAGNOSIS — E1165 Type 2 diabetes mellitus with hyperglycemia: Secondary | ICD-10-CM

## 2018-02-19 DIAGNOSIS — IMO0002 Reserved for concepts with insufficient information to code with codable children: Secondary | ICD-10-CM

## 2018-02-19 DIAGNOSIS — E039 Hypothyroidism, unspecified: Secondary | ICD-10-CM

## 2018-02-19 DIAGNOSIS — C9110 Chronic lymphocytic leukemia of B-cell type not having achieved remission: Secondary | ICD-10-CM

## 2018-02-19 DIAGNOSIS — N183 Chronic kidney disease, stage 3 unspecified: Secondary | ICD-10-CM

## 2018-02-19 DIAGNOSIS — D500 Iron deficiency anemia secondary to blood loss (chronic): Secondary | ICD-10-CM

## 2018-02-19 DIAGNOSIS — E1121 Type 2 diabetes mellitus with diabetic nephropathy: Secondary | ICD-10-CM

## 2018-02-19 DIAGNOSIS — E7849 Other hyperlipidemia: Secondary | ICD-10-CM

## 2018-02-20 ENCOUNTER — Ambulatory Visit: Payer: Medicare Other

## 2018-02-20 ENCOUNTER — Ambulatory Visit (INDEPENDENT_AMBULATORY_CARE_PROVIDER_SITE_OTHER): Payer: Medicare Other

## 2018-02-20 VITALS — BP 100/56 | HR 67 | Temp 97.7°F | Ht 71.5 in | Wt 148.2 lb

## 2018-02-20 DIAGNOSIS — N183 Chronic kidney disease, stage 3 unspecified: Secondary | ICD-10-CM

## 2018-02-20 DIAGNOSIS — E7849 Other hyperlipidemia: Secondary | ICD-10-CM

## 2018-02-20 DIAGNOSIS — E1165 Type 2 diabetes mellitus with hyperglycemia: Secondary | ICD-10-CM

## 2018-02-20 DIAGNOSIS — E1121 Type 2 diabetes mellitus with diabetic nephropathy: Secondary | ICD-10-CM | POA: Diagnosis not present

## 2018-02-20 DIAGNOSIS — IMO0002 Reserved for concepts with insufficient information to code with codable children: Secondary | ICD-10-CM

## 2018-02-20 DIAGNOSIS — D500 Iron deficiency anemia secondary to blood loss (chronic): Secondary | ICD-10-CM

## 2018-02-20 DIAGNOSIS — Z0000 Encounter for general adult medical examination without abnormal findings: Secondary | ICD-10-CM

## 2018-02-20 DIAGNOSIS — E039 Hypothyroidism, unspecified: Secondary | ICD-10-CM | POA: Diagnosis not present

## 2018-02-20 LAB — COMPREHENSIVE METABOLIC PANEL
ALT: 13 U/L (ref 0–53)
AST: 23 U/L (ref 0–37)
Albumin: 4.3 g/dL (ref 3.5–5.2)
Alkaline Phosphatase: 61 U/L (ref 39–117)
BUN: 42 mg/dL — ABNORMAL HIGH (ref 6–23)
CALCIUM: 10.3 mg/dL (ref 8.4–10.5)
CO2: 27 mEq/L (ref 19–32)
CREATININE: 1.8 mg/dL — AB (ref 0.40–1.50)
Chloride: 109 mEq/L (ref 96–112)
GFR: 39.01 mL/min — ABNORMAL LOW (ref >60.00)
Glucose, Bld: 263 mg/dL — ABNORMAL HIGH (ref 70–99)
Potassium: 5.4 mEq/L — ABNORMAL HIGH (ref 3.5–5.1)
Sodium: 143 mEq/L (ref 135–145)
Total Bilirubin: 1.1 mg/dL (ref 0.2–1.2)
Total Protein: 6.8 g/dL (ref 6.0–8.3)

## 2018-02-20 LAB — TSH: TSH: 7.4 u[IU]/mL — ABNORMAL HIGH (ref 0.35–4.50)

## 2018-02-20 LAB — LIPID PANEL
Cholesterol: 117 mg/dL (ref 0–200)
HDL: 31.9 mg/dL — ABNORMAL LOW (ref >39.00)
LDL Cholesterol: 65 mg/dL (ref 0–99)
NonHDL: 85.14
Total CHOL/HDL Ratio: 4
Triglycerides: 103 mg/dL (ref 0.0–149.0)
VLDL: 20.6 mg/dL (ref 0.0–40.0)

## 2018-02-20 LAB — HEMOGLOBIN A1C: Hgb A1c MFr Bld: 9.5 % — ABNORMAL HIGH (ref 4.6–6.5)

## 2018-02-20 LAB — FERRITIN: Ferritin: 124 ng/mL (ref 22.0–322.0)

## 2018-02-20 LAB — IBC PANEL
Iron: 141 ug/dL (ref 42–165)
Saturation Ratios: 50.4 % — ABNORMAL HIGH (ref 20.0–50.0)
Transferrin: 200 mg/dL — ABNORMAL LOW (ref 212.0–360.0)

## 2018-02-20 LAB — VITAMIN D 25 HYDROXY (VIT D DEFICIENCY, FRACTURES): VITD: 48.49 ng/mL (ref 30.00–100.00)

## 2018-02-20 NOTE — Progress Notes (Signed)
PCP notes:   Health maintenance:  A1C - completed  Abnormal screenings:   Hearing - failed  Hearing Screening   125Hz  250Hz  500Hz  1000Hz  2000Hz  3000Hz  4000Hz  6000Hz  8000Hz   Right ear:   40 0 40  0    Left ear:   40 0 40  0    Vision Screening Comments: Vision exam in Jan 2019   Mini-Cog score: 19/20 MMSE - Mini Mental State Exam 02/20/2018 02/19/2017 02/12/2016  Orientation to time 5 5 5   Orientation to Place 5 5 5   Registration 3 3 3   Attention/ Calculation 0 0 0  Recall 2 1 3   Recall-comments unable to recall 1 of 3 word unable to recall 2 of 3 words -  Language- name 2 objects 0 0 0  Language- repeat 1 1 1   Language- follow 3 step command 3 3 3   Language- read & follow direction 0 0 0  Write a sentence 0 0 0  Copy design 0 0 0  Total score 19 18 20     Patient concerns:   Patient verbalized concerns about elevated BG readings and loss of appetite.   Nurse concerns:  None  Next PCP appt:   02/27/18 @ 0930

## 2018-02-20 NOTE — Progress Notes (Signed)
Subjective:   Scott [name] is a 77 y.o. male who presents for Medicare Annual/Subsequent preventive examination.  Review of Systems:  N/A Cardiac Risk Factors include: advanced age (>22men, >63 women);male gender;dyslipidemia;diabetes mellitus;hypertension     Objective:    Vitals: BP (!) 100/56 (BP Location: Right Arm, Patient Position: Sitting, Cuff Size: Normal)   Pulse 67   Temp 97.7 F (36.5 C) (Oral)   Ht 5' 11.5" (1.816 m) Comment: shoes  Wt 148 lb 4 oz (67.2 kg)   SpO2 91%   BMI 20.39 kg/m   Body mass index is 20.39 kg/m.  Advanced Directives 02/20/2018 02/02/2018 01/05/2018 12/01/2017 11/17/2017 10/30/2017 09/03/2017  Does Patient Have a Medical Advance Directive? No No No No No Yes No  Type of Advance Directive - - Public librarian;Living will - [place];Living will -  Does patient want to make changes to medical advance directive? No - Patient declined No - Patient declined - No - Patient declined - No - Patient declined No - Patient declined  Copy of [place] in Chart? - - - No - copy requested - No - copy requested -  Would patient like information on creating a medical advance directive? No - Patient declined No - Patient declined - No - Patient declined - - -  Pre-existing out of facility DNR order (yellow form or pink MOST form) - - - - - - -    Tobacco Social History   Tobacco Use  Smoking Status Former Smoker  . Last attempt to quit: 03/04/1978  . Years since quitting: 39.9  Smokeless Tobacco Never Used     Counseling given: No   Clinical Intake:  Pre-visit preparation completed: Yes  Pain : No/denies pain Pain Score: 0-No pain     Nutritional Status: BMI 25 -29 Overweight Diabetes: Yes CBG done?: No Did pt. bring in CBG monitor from home?: No  How often do you need to have someone help you when you read instructions, pamphlets, or other written materials from your doctor or pharmacy?: 1 -  Never What is the last grade level you completed in school?: 12th grade  Interpreter Needed?: No  Comments: pt is a widower and lives alone Information entered by :: LPinson, LPN  Past Medical History:  Diagnosis Date  . Acquired hand deformity 1962   hand saw accident at work  . Anemia 10/11/2011  . Arthritis   . Bradycardia 10/10/2011  . CAD (coronary artery disease) 10/10/2011   MI s/p PTCA (Dx-OM2 proximal concentric stenosis)  . CKD (chronic kidney disease) stage 3, GFR 30-59 ml/min (HCC)    [name]  . CLL (chronic lymphocytic leukemia) ([place])   . Colon polyps   . Generalized headaches    frequent  . GI bleed 07/08/2017  . Glaucoma    s/p laser surgery  . HLD (hyperlipidemia)   . Hypertension   . Hypothyroidism 10/10/2011  . Thrombocytopenia ([place]) 10/11/2011  . Type 2 diabetes with nephropathy [facility])    DM refresher course [facility] (04/2013)   Past Surgical History:  Procedure Laterality Date  . BACK SURGERY     cervical neck  . CATARACT EXTRACTION, BILATERAL Bilateral 2017  . COLONOSCOPY  11/2008   1 polyp, diverticulosis, rec rpt 5 yrs (Dr. Oletta Lamas, [name])  . COLONOSCOPY  06/2014   hyperplastic polyp, rpt 5 yrs ([facility])  . COLONOSCOPY WITH PROPOFOL N/A 11/17/2017   TA, HP, (Vanga, Tally Due, MD)  . ESOPHAGOGASTRODUODENOSCOPY (  EGD) WITH PROPOFOL N/A 11/17/2017   healing erosive gastritis, intestinal metaplasia, neg H pylori (Vanga, Tally Due, MD)  . EYE SURGERY  2012   laser surgery for glaucoma  . PTCA  1994, 1995  . US ECHOCARDIOGRAPHY  10/2013   inferior wall hypokinesis, mild LVH, EF 50-55%, mild MR and LA dilation   Family History  Problem Relation Age of Onset  . Diabetes Sister   . Stomach cancer Sister 53  . Pneumonia Father        caused death  . Other Brother        no communication with brother so unsure of any health conditions  . Coronary artery disease Son 2       5v CABG and stents  . Hyperlipidemia Sister   . Stroke Neg Hx   . Heart attack Neg  Hx    Social History   Socioeconomic History  . Marital status: Widowed    Spouse name: Not on file  . Number of children: Not on file  . Years of education: Not on file  . Highest education level: Not on file  Occupational History  . Not on file  Social Needs  . Financial resource strain: Not on file  . Food insecurity:    Worry: Not on file    Inability: Not on file  . Transportation needs:    Medical: Not on file    Non-medical: Not on file  Tobacco Use  . Smoking status: Former Smoker    Last attempt to quit: 03/04/1978    Years since quitting: 39.9  . Smokeless tobacco: Never Used  Substance and Sexual Activity  . Alcohol use: No  . Drug use: No  . Sexual activity: Never  Lifestyle  . Physical activity:    Days per week: Not on file    Minutes per session: Not on file  . Stress: Not on file  Relationships  . Social connections:    Talks on phone: Not on file    Gets together: Not on file    Attends religious service: Not on file    Active member of club or organization: Not on file    Attends meetings of clubs or organizations: Not on file    Relationship status: Not on file  Other Topics Concern  . Not on file  Social History Narrative   Widower. Wife passed 08/2015 from cancer. 1 dog   Occupation: retired, was route Hotel manager   Edu: 11th gr   Activity: walks dog (pomeranian) 3-4x/day, yardwork, rides bicycle.   Diet: drinks diet coke, no vegetables    Outpatient Encounter Medications as of 02/20/2018  Medication Sig  . acetaminophen (TYLENOL) 650 MG CR tablet Take 650 mg by mouth every 8 (eight) hours as needed for pain.  Marland Kitchen aspirin EC 81 MG tablet Take 1 tablet (81 mg total) by mouth daily.  Marland Kitchen atorvastatin (LIPITOR) 80 MG tablet Take 1 tablet (80 mg total) by mouth daily.  . carvedilol (COREG) 6.25 MG tablet TAKE 1 TABLET BY MOUTH TWICE DAILY  . Continuous Blood Gluc Receiver (FREESTYLE LIBRE 14 DAY READER) DEVI 1 Device by Does not apply route as  directed. Patient gives insulin and checks sugars 4 times a day. E11.21  . Continuous Blood Gluc Sensor (FREESTYLE LIBRE 14 DAY SENSOR) MISC 1 Device by Does not apply route as directed. Patient gives insulin and checks sugars 4 times a day. E11.21  . glucose blood (ONE TOUCH ULTRA TEST) test strip 1  each by Other route 4 (four) times daily. and as needed to check sugars Dx code:  E11.21  . insulin aspart (NOVOLOG) 100 UNIT/ML FlexPen Inject 5 Units into the skin 3 (three) times daily with meals. (3-7 units)  . Insulin Detemir (LEVEMIR FLEXTOUCH) 100 UNIT/ML Pen INJECT 25 UNITS SUBCUTANEOUSLY ONCE DAILY AT  10PM  . levothyroxine (SYNTHROID, LEVOTHROID) 50 MCG tablet TAKE 1 TABLET BY MOUTH ONCE DAILY  . lisinopril (PRINIVIL,ZESTRIL) 10 MG tablet Take 1 tablet (10 mg total) by mouth daily.  . niacin (SLO-NIACIN) 500 MG tablet Take 1,000 mg by mouth at bedtime.   . nitroGLYCERIN (NITROSTAT) 0.4 MG SL tablet Place 1 tablet (0.4 mg total) under the tongue every 5 (five) minutes as needed for chest pain ((MAX of 3 doses)).  Marland Kitchen pantoprazole (PROTONIX) 40 MG tablet Take 1 tablet (40 mg total) by mouth 2 (two) times daily before a meal.  . traZODone (DESYREL) 50 MG tablet Take 0.5-1 tablets (25-50 mg total) by mouth at bedtime as needed for sleep.  . vitamin B-12 1000 MCG tablet Take 1 tablet (1,000 mcg total) by mouth daily.  . furosemide (LASIX) 20 MG tablet Take 1 tablet (20 mg total) by mouth daily.   No facility-administered encounter medications on file as of 02/20/2018.     Activities of Daily Living In your present state of health, do you have any difficulty performing the following activities: 02/20/2018 07/08/2017  Hearing? Y N  Vision? N N  Difficulty concentrating or making decisions? N N  Walking or climbing stairs? N N  Dressing or bathing? N N  Doing errands, shopping? N N  Preparing Food and eating ? N -  Using the Toilet? N -  In the past six months, have you accidently leaked urine?  N -  Do you have problems with loss of bowel control? N -  Managing your Medications? N -  Managing your Finances? N -  Housekeeping or managing your Housekeeping? N -  Some recent data might be hidden    Patient Care Team: [name], MD as PCP - General (Family Medicine) [name], Kathlene November, MD as Consulting Physician (Oncology) Clent Jacks, MD as Consulting Physician (Ophthalmology) Dorothy Spark, MD as Consulting Physician (Cardiology) Madelon Lips, MD as Consulting Physician (Nephrology)   Assessment:   This is a routine wellness examination for Jardine.   Hearing Screening   125Hz  250Hz  500Hz  1000Hz  2000Hz  3000Hz  4000Hz  6000Hz  8000Hz   Right ear:   40 0 40  0    Left ear:   40 0 40  0    Vision Screening Comments: Vision exam in Jan 2019   Exercise Activities and Dietary recommendations Current Exercise Habits: Home exercise routine, Type of exercise: treadmill, Time (Minutes): 10, Frequency (Times/Week): 1, Weekly Exercise (Minutes/Week): 10, Intensity: Mild, Exercise limited by: None identified  Goals    . Patient Stated     Starting 02/20/2018, I will continue to take medications as prescribed.        Fall Risk Fall Risk  02/20/2018 02/19/2017 02/12/2016 02/08/2015 01/25/2014  Falls in the past year? 0 No No Yes No  Number falls in past yr: - - - 1 -  Comment - - - Tripped over "junk" in garage -  Injury with Fall? - - - No -   Depression Screen PHQ 2/9 Scores 02/20/2018 02/19/2017 02/12/2016 02/08/2015  PHQ - 2 Score 0 0 0 0  PHQ- 9 Score 0 0 - -    Cognitive  Function MMSE - Mini Mental State Exam 02/20/2018 02/19/2017 02/12/2016  Orientation to time 5 5 5   Orientation to Place 5 5 5   Registration 3 3 3   Attention/ Calculation 0 0 0  Recall 2 1 3   Recall-comments unable to recall 1 of 3 word unable to recall 2 of 3 words -  Language- name 2 objects 0 0 0  Language- repeat 1 1 1   Language- follow 3 step command 3 3 3   Language- read &  follow direction 0 0 0  Write a sentence 0 0 0  Copy design 0 0 0  Total score 19 18 20        PLEASE NOTE: A Mini-Cog screen was completed. Maximum score is 20. A value of 0 denotes this part of [place] MMSE was not completed or the patient failed this part of the Mini-Cog screening.   Mini-Cog Screening Orientation to Time - Max 5 pts Orientation to Place - Max 5 pts Registration - Max 3 pts Recall - Max 3 pts Language Repeat - Max 1 pts Language Follow 3 Step Command - Max 3 pts   Immunization History  Administered Date(s) Administered  . Influenza Split 12/03/2011  . Influenza,inj,Quad PF,6+ Mos 11/06/2012, 01/25/2014, 02/08/2015, 01/03/2016, 02/17/2017, 01/22/2018  . Pneumococcal Conjugate-13 07/21/2013  . Pneumococcal Polysaccharide-23 03/11/2011  . Tdap 08/23/2017  . Zoster 03/17/2013    Screening Tests Health Maintenance  Topic Date Due  . OPHTHALMOLOGY EXAM  03/21/2018  . HEMOGLOBIN A1C  08/22/2018  . FOOT EXAM  08/23/2018  . COLONOSCOPY  11/18/2022  . DTaP/Tdap/Td (2 - Td) 08/24/2027  . TETANUS/TDAP  08/24/2027  . INFLUENZA VACCINE  Completed  . PNA vac Low Risk Adult  Completed      Plan:     I have personally reviewed, addressed, and noted the following in the patient's chart:  A. Medical and social history B. Use of alcohol, tobacco or illicit drugs  C. Current medications and supplements D. Functional ability and status E.  Nutritional status F.  Physical activity G. Advance directives H. List of other physicians I.  Hospitalizations, surgeries, and ER visits in previous 12 months J.  [place] to include hearing, vision, cognitive, depression L. Referrals and appointments - none  In addition, I have reviewed and discussed with patient certain preventive protocols, quality metrics, and best practice recommendations. A written personalized care plan for preventive services as well as general preventive health recommendations were  provided to patient.  See attached scanned questionnaire for additional information.   Signed,   Lindell Noe, MHA, BS, LPN Health Coach

## 2018-02-20 NOTE — Patient Instructions (Addendum)
Scott [name] , Thank you for taking time to come for your Medicare Wellness Visit. I appreciate your ongoing commitment to your health goals. Please review the following plan we discussed and let me know if I can assist you in the future.   These are the goals we discussed: Goals    . Patient Stated     Starting 02/20/2018, I will continue to take medications as prescribed.        This is a list of the screening recommended for you and due dates:  Health Maintenance  Topic Date Due  . Eye exam for diabetics  03/21/2018  . Hemoglobin A1C  08/22/2018  . Complete foot exam   08/23/2018  . Colon Cancer Screening  11/18/2022  . DTaP/Tdap/Td vaccine (2 - Td) 08/24/2027  . Tetanus Vaccine  08/24/2027  . Flu Shot  Completed  . Pneumonia vaccines  Completed   Preventive Care for Adults  A healthy lifestyle and preventive care can promote health and wellness. Preventive health guidelines for adults include the following key practices.  . A routine yearly physical is a good way to check with your health care provider about your health and preventive screening. It is a chance to share any concerns and updates on your health and to receive a thorough exam.  . Visit your dentist for a routine exam and preventive care every 6 months. Brush your teeth twice a day and floss once a day. Good oral hygiene prevents tooth decay and gum disease.  . The frequency of eye exams is based on your age, health, family medical history, use  of contact lenses, and other factors. Follow your health care provider's recommendations for frequency of eye exams.  . Eat a healthy diet. Foods like vegetables, fruits, whole grains, low-fat dairy products, and lean protein foods contain the nutrients you need without too many calories. Decrease your intake of foods high in solid fats, added sugars, and salt. Eat the right amount of calories for you. Get information about a proper diet from your health care provider, if  necessary.  . Regular physical exercise is one of the most important things you can do for your health. Most adults should get at least 150 minutes of moderate-intensity exercise (any activity that increases your heart rate and causes you to sweat) each week. In addition, most adults need muscle-strengthening exercises on 2 or more days a week.  Silver Sneakers may be a benefit available to you. To determine eligibility, you may visit the website: www.silversneakers.com or contact program at 205-056-6258 Mon-Fri between 8AM-8PM.   . Maintain a healthy weight. The body mass index (BMI) is a screening tool to identify possible weight problems. It provides an estimate of body fat based on height and weight. Your health care provider can find your BMI and can help you achieve or maintain a healthy weight.   For adults 20 years and older: ? A BMI below 18.5 is considered underweight. ? A BMI of 18.5 to 24.9 is normal. ? A BMI of 25 to 29.9 is considered overweight. ? A BMI of 30 and above is considered obese.   . Maintain normal blood lipids and cholesterol levels by exercising and minimizing your intake of saturated fat. Eat a balanced diet with plenty of fruit and vegetables. Blood tests for lipids and cholesterol should begin at age 78 and be repeated every 5 years. If your lipid or cholesterol levels are high, you are over 50, or you are  at high risk for heart disease, you may need your cholesterol levels checked more frequently. Ongoing high lipid and cholesterol levels should be treated with medicines if diet and exercise are not working.  . If you smoke, find out from your health care provider how to quit. If you do not use tobacco, please do not start.  . If you choose to drink alcohol, please do not consume more than 2 drinks per day. One drink is considered to be 12 ounces (355 mL) of beer, 5 ounces (148 mL) of wine, or 1.5 ounces (44 mL) of liquor.  . If you are 62-1 years old, ask your  health care provider if you should take aspirin to prevent strokes.  . Use sunscreen. Apply sunscreen liberally and repeatedly throughout the day. You should seek shade when your shadow is shorter than you. Protect yourself by wearing long sleeves, pants, a wide-brimmed hat, and sunglasses year round, whenever you are outdoors.  . Once a month, do a whole body skin exam, using a mirror to look at the skin on your back. Tell your health care provider of new moles, moles that have irregular borders, moles that are larger than a pencil eraser, or moles that have changed in shape or color.

## 2018-02-21 ENCOUNTER — Other Ambulatory Visit: Payer: Self-pay | Admitting: Family Medicine

## 2018-02-21 DIAGNOSIS — E1165 Type 2 diabetes mellitus with hyperglycemia: Principal | ICD-10-CM

## 2018-02-21 DIAGNOSIS — E1121 Type 2 diabetes mellitus with diabetic nephropathy: Secondary | ICD-10-CM

## 2018-02-21 DIAGNOSIS — IMO0002 Reserved for concepts with insufficient information to code with codable children: Secondary | ICD-10-CM

## 2018-02-21 NOTE — Progress Notes (Signed)
I reviewed health advisor's note, was available for consultation, and agree with documentation and plan.  

## 2018-02-26 ENCOUNTER — Encounter: Payer: Self-pay | Admitting: Family Medicine

## 2018-02-26 NOTE — Progress Notes (Signed)
BP 120/62 (BP Location: Left Arm, Patient Position: Sitting, Cuff Size: Normal)   Pulse 63   Temp 97.6 F (36.4 C) (Oral)   Ht 5' 11.5" (1.816 m)   Wt 152 lb 8 oz (69.2 kg)   SpO2 97%   BMI 20.97 kg/m    CC: AMW f/u visit Subjective:    Patient ID: Scott [name], male    DOB: April 05, 1940, 77 y.o.   MRN: [id number]  HPI: Scott [name] is a 77 y.o. male presenting on 02/27/2018 for Annual Exam (Pt 2. )    Saw Katha Cabal last week for medicare wellness visit. Note reviewed.   Has not been taking oral b12.   Not feeling well today - cold symptoms, then had nosebleed last night.  Chronic thrombocytopenia presumed due to ITP, despite Nplate  Chronically brittle DM - last visit there was concern of overusing novolog (more than just with meals) so I recommended novolog 5 units with meals only (eats two meals a day). We also prescribed continuous glucose meter as he had increased frequency of cbg checks throughout the day - states CGM would cost $84 q2 wks. Taking levemir 25 u nightly, novolog 8u with meals. Struggles with preparing meals. He does take 1/2-1 glucerna a day. Breakfast is the largest meal.  Lab Results  Component Value Date   HGBA1C 9.5 (H) 02/20/2018     BP running well controlled 110-140s/50-60s, HR 50-70.   Preventative: COLONOSCOPY Date: 06/2014 hyperplastic polyp, rpt 5 yrs Oletta Lamas) Prostate screening - normal DRE and PSA2015-decided tostopscreening. Flu shotyearly Pneumovax 2013. prevnar 07/2013 zostavax 2015 shingrix - discussed, declines Advanced directives -previously wantedfull codebut with wife's death experience, likely would change decision. Has packet at home, needs to get it notarized. Will bring [place] copy when complete. HCPOA likely 2 sons.  Seat belt use discussed. Sunscreen use discussed.No changing moles on skin.  Ex smoker Alcohol use - none  Lives alone - widower 2017, 1 dog Occupation: retired, was route Hotel manager Edu: 11th  gr Activity: walks dog 3-4x/day, yardwork, rides bicycle. Diet: drinks diet coke, no vegetables     Relevant past medical, surgical, family and social history reviewed and updated as indicated. Interim medical history since our last visit reviewed. Allergies and medications reviewed and updated. Outpatient Medications Prior to Visit  Medication Sig Dispense Refill  . acetaminophen (TYLENOL) 650 MG CR tablet Take 650 mg by mouth every 8 (eight) hours as needed for pain.    Marland Kitchen aspirin EC 81 MG tablet Take 1 tablet (81 mg total) by mouth daily. 90 tablet 3  . atorvastatin (LIPITOR) 80 MG tablet Take 1 tablet (80 mg total) by mouth daily. 90 tablet 3  . carvedilol (COREG) 6.25 MG tablet TAKE 1 TABLET BY MOUTH TWICE DAILY 180 tablet 3  . Continuous Blood Gluc Receiver (FREESTYLE LIBRE 14 DAY READER) DEVI 1 Device by Does not apply route as directed. Patient gives insulin and checks sugars 4 times a day. E11.21 1 Device 0  . Continuous Blood Gluc Sensor (FREESTYLE LIBRE 14 DAY SENSOR) MISC 1 Device by Does not apply route as directed. Patient gives insulin and checks sugars 4 times a day. E11.21 6 each 3  . glucose blood (ONE TOUCH ULTRA TEST) test strip 1 each by Other route 4 (four) times daily. and as needed to check sugars Dx code:  E11.21 400 each 3  . Insulin Detemir (LEVEMIR FLEXTOUCH) 100 UNIT/ML Pen INJECT 25 UNITS SUBCUTANEOUSLY ONCE DAILY AT  10PM 15  pen 3  . pantoprazole (PROTONIX) 40 MG tablet Take 1 tablet (40 mg total) by mouth 2 (two) times daily before a meal. 180 tablet 0  . traZODone (DESYREL) 50 MG tablet Take 0.5-1 tablets (25-50 mg total) by mouth at bedtime as needed for sleep. 90 tablet 1  . insulin aspart (NOVOLOG) 100 UNIT/ML FlexPen Inject 5 Units into the skin 3 (three) times daily with meals. (3-7 units) 15 mL 3  . levothyroxine (SYNTHROID, LEVOTHROID) 50 MCG tablet TAKE 1 TABLET BY MOUTH ONCE DAILY 90 tablet 2  . lisinopril (PRINIVIL,ZESTRIL) 10 MG tablet Take 1 tablet (10  mg total) by mouth daily. 90 tablet 1  . niacin (SLO-NIACIN) 500 MG tablet Take 1,000 mg by mouth at bedtime.     . nitroGLYCERIN (NITROSTAT) 0.4 MG SL tablet Place 1 tablet (0.4 mg total) under the tongue every 5 (five) minutes as needed for chest pain ((MAX of 3 doses)). 25 tablet 0  . furosemide (LASIX) 20 MG tablet Take 1 tablet (20 mg total) by mouth daily. 30 tablet 4  . vitamin B-12 1000 MCG tablet Take 1 tablet (1,000 mcg total) by mouth daily. (Patient not taking: Reported on 02/27/2018) 120 tablet 0   No facility-administered medications prior to visit.      Per HPI unless specifically indicated in ROS section below Review of Systems Objective:    BP 120/62 (BP Location: Left Arm, Patient Position: Sitting, Cuff Size: Normal)   Pulse 63   Temp 97.6 F (36.4 C) (Oral)   Ht 5' 11.5" (1.816 m)   Wt 152 lb 8 oz (69.2 kg)   SpO2 97%   BMI 20.97 kg/m   Wt Readings from Last 3 Encounters:  02/27/18 152 lb 8 oz (69.2 kg)  02/20/18 148 lb 4 oz (67.2 kg)  01/22/18 151 lb (68.5 kg)    Physical Exam Vitals signs and nursing note reviewed.  Constitutional:      General: He is not in acute distress.    Appearance: He is well-developed.  HENT:     Head: Normocephalic and atraumatic.     Right Ear: Hearing, tympanic membrane, ear canal and external ear normal.     Left Ear: Hearing, tympanic membrane, ear canal and external ear normal.     Nose: Nose normal.     Mouth/Throat:     Pharynx: Uvula midline. No oropharyngeal exudate or posterior oropharyngeal erythema.  Eyes:     General: No scleral icterus.    Conjunctiva/sclera: Conjunctivae normal.     Pupils: Pupils are equal, round, and reactive to light.  Neck:     Musculoskeletal: Normal range of motion and neck supple.     Vascular: Carotid bruit (L sided) present.  Cardiovascular:     Rate and Rhythm: Normal rate and regular rhythm.     Pulses:          Radial pulses are 2+ on the right side and 2+ on the left side.      Heart sounds: Normal heart sounds. No murmur.  Pulmonary:     Effort: Pulmonary effort is normal. No respiratory distress.     Breath sounds: Normal breath sounds. No wheezing or rales.  Abdominal:     General: Bowel sounds are normal. There is no distension.     Palpations: Abdomen is soft. There is no mass.     Tenderness: There is no abdominal tenderness. There is no guarding or rebound.  Musculoskeletal: Normal range of motion.  Lymphadenopathy:  Cervical: No cervical adenopathy.  Skin:    General: Skin is warm and dry.     Findings: No rash.  Neurological:     Mental Status: He is alert and oriented to person, place, and time.     Comments: CN grossly intact, station and gait intact  Psychiatric:        Behavior: Behavior normal.        Thought Content: Thought content normal.        Judgment: Judgment normal.       Results for orders placed or performed in visit on 02/27/18  Renal function panel  Result Value Ref Range   Sodium 144 135 - 145 mEq/L   Potassium 5.0 3.5 - 5.1 mEq/L   Chloride 109 96 - 112 mEq/L   CO2 25 19 - 32 mEq/L   Calcium 9.9 8.4 - 10.5 mg/dL   Albumin 4.2 3.5 - 5.2 g/dL   BUN 38 (H) 6 - 23 mg/dL   Creatinine, Ser 1.62 (H) 0.40 - 1.50 mg/dL   Glucose, Bld 145 (H) 70 - 99 mg/dL   Phosphorus 3.3 2.3 - 4.6 mg/dL   GFR 44.05 (L) >60.00 mL/min   Lab Results  Component Value Date   WBC 6.2 02/16/2018   HGB 8.3 (L) 02/16/2018   HCT 26.3 (L) 02/16/2018   MCV 100.8 (H) 02/16/2018   PLT 30 (L) 02/16/2018    Lab Results  Component Value Date   CHOL 117 02/20/2018   HDL 31.90 (L) 02/20/2018   LDLCALC 65 02/20/2018   LDLDIRECT 84 09/04/2011   TRIG 103.0 02/20/2018   CHOLHDL 4 02/20/2018    Assessment & Plan:   Problem List Items Addressed This Visit    Uncontrolled type 2 diabetes mellitus with nephropathy ([place]) - Primary    Chronic brittle diabetes - sugar log reviewed with wide fluctuations 110-400s. Latest A1c was worse at 9.5%. Again  reviewed correct timing for novolog to take prior to meals as well as importance of scheduled mealtimes. He normally has breakfast and lunch and tends to not eat dinner. Planning to schedule diabetes refresher course with nutritionist. No further lows since we changed novolog to just with meals - he was previously likely overcorrecting for high sugars too soon between novolog injections. Will again change regimen to 5u with meals but increase to 10u if cbg >250. RTC 1 mo f/u visit. Pt agrees with plan.  We sent in CGM but pt states was unaffordable. I asked him to call insurance as I do think he qualifies for this with good insurance coverage given frequency of sugars checked.       Relevant Medications   insulin aspart (NOVOLOG) 100 UNIT/ML FlexPen   Other Relevant Orders   Renal function panel (Completed)   Idiopathic thrombocytopenic purpura (ITP) (HCC)    Acutely worsening followed by heme, has appt on Monday. Given low plt count, I did suggest he back off aspirin, to check with cards in stent hx.       Hypothyroidism    TSH elevated - will increase levothyroxine to 75mcg daily.       Relevant Medications   levothyroxine (SYNTHROID, LEVOTHROID) 75 MCG tablet   Hypertension    Chronic, overall stable readings. Continue carvedilol, lasix. See below for lisinopril plan.       Relevant Medications   nitroGLYCERIN (NITROSTAT) 0.4 MG SL tablet   Hyperkalemia    Recent K up to 5.4 so we held lisinopril. Will recheck labs, stay  off ACEI for now.       HLD (hyperlipidemia)    Chronic, stable. On niacin and lipitor. Consider stopping niacin in brittle diabetes history.  The ASCVD Risk score Mikey Bussing DC Jr., et al., 2013) failed to calculate for the following reasons:   The valid total cholesterol range is 130 to 320 mg/dL       Relevant Medications   nitroGLYCERIN (NITROSTAT) 0.4 MG SL tablet   Diabetic neuropathy (HCC)   Relevant Medications   insulin aspart (NOVOLOG) 100 UNIT/ML  FlexPen   CLL (chronic lymphocytic leukemia) (HCC)    Followed by onc - appreciate their care.      CKD (chronic kidney disease) stage 3, GFR 30-59 ml/min (HCC)    Sees nephrologist. Cr have recently bumped. See below      CAD (coronary artery disease)   Relevant Medications   nitroGLYCERIN (NITROSTAT) 0.4 MG SL tablet   Anemia in CKD (chronic kidney disease)   Advanced care planning/counseling discussion    Advanced directives -previously wantedfull codebut with wife's death experience, likely would change decision. Has packet at home, needs to get it notarized. Will bring [place] copy when complete. HCPOA likely 2 sons.        Other Visit Diagnoses    Left carotid bruit       Relevant Orders   VAS US CAROTID       Meds ordered this encounter  Medications  . nitroGLYCERIN (NITROSTAT) 0.4 MG SL tablet    Sig: Place 1 tablet (0.4 mg total) under the tongue every 5 (five) minutes as needed for chest pain ((MAX of 3 doses)).    Dispense:  25 tablet    Refill:  0  . insulin aspart (NOVOLOG) 100 UNIT/ML FlexPen    Sig: Take 5 units with meals, take 10 units if mealtime sugar >250    Dispense:  15 mL    Refill:  3  . levothyroxine (SYNTHROID, LEVOTHROID) 75 MCG tablet    Sig: Take 1 tablet (75 mcg total) by mouth daily.    Dispense:  90 tablet    Refill:  1    Note new sig   Orders Placed This Encounter  Procedures  . Renal function panel   Patient Instructions  Labs today.  Stop lisinopril for now.  Increase levothyroxine to 45mcg daily - new dose at pharmacy. New insulin regimen - take 5 units novolog with all meals, if sugars >250 when you check with meal, then take 10 units. Continue levemir 25u at night time.  Call insurance to ask about continuous glucose meter -as you do qualify for it with how much insulin shots you do and how often you're checking. Let me know what you find out.  If interested, check with pharmacy about new 2 shot shingles series (shingrix).    Bring in advanced directive when complete/notarized.  Consider decreasing aspirin frequency - check with cardiology.  We will check carotid ultrasound   Follow up plan: Return in about 4 weeks (around 03/27/2018), or if symptoms worsen or fail to improve, for follow up visit.  [name], MD

## 2018-02-27 ENCOUNTER — Ambulatory Visit (INDEPENDENT_AMBULATORY_CARE_PROVIDER_SITE_OTHER): Payer: Medicare Other | Admitting: Family Medicine

## 2018-02-27 ENCOUNTER — Encounter: Payer: Self-pay | Admitting: Family Medicine

## 2018-02-27 VITALS — BP 120/62 | HR 63 | Temp 97.6°F | Ht 71.5 in | Wt 152.5 lb

## 2018-02-27 DIAGNOSIS — N183 Chronic kidney disease, stage 3 unspecified: Secondary | ICD-10-CM

## 2018-02-27 DIAGNOSIS — I2510 Atherosclerotic heart disease of native coronary artery without angina pectoris: Secondary | ICD-10-CM | POA: Diagnosis not present

## 2018-02-27 DIAGNOSIS — D631 Anemia in chronic kidney disease: Secondary | ICD-10-CM

## 2018-02-27 DIAGNOSIS — Z7189 Other specified counseling: Secondary | ICD-10-CM

## 2018-02-27 DIAGNOSIS — E1142 Type 2 diabetes mellitus with diabetic polyneuropathy: Secondary | ICD-10-CM | POA: Diagnosis not present

## 2018-02-27 DIAGNOSIS — D693 Immune thrombocytopenic purpura: Secondary | ICD-10-CM

## 2018-02-27 DIAGNOSIS — R0989 Other specified symptoms and signs involving the circulatory and respiratory systems: Secondary | ICD-10-CM | POA: Diagnosis not present

## 2018-02-27 DIAGNOSIS — E1165 Type 2 diabetes mellitus with hyperglycemia: Secondary | ICD-10-CM | POA: Diagnosis not present

## 2018-02-27 DIAGNOSIS — E785 Hyperlipidemia, unspecified: Secondary | ICD-10-CM | POA: Diagnosis not present

## 2018-02-27 DIAGNOSIS — I10 Essential (primary) hypertension: Secondary | ICD-10-CM | POA: Diagnosis not present

## 2018-02-27 DIAGNOSIS — IMO0002 Reserved for concepts with insufficient information to code with codable children: Secondary | ICD-10-CM

## 2018-02-27 DIAGNOSIS — E7849 Other hyperlipidemia: Secondary | ICD-10-CM | POA: Diagnosis not present

## 2018-02-27 DIAGNOSIS — E02 Subclinical iodine-deficiency hypothyroidism: Secondary | ICD-10-CM

## 2018-02-27 DIAGNOSIS — E039 Hypothyroidism, unspecified: Secondary | ICD-10-CM | POA: Diagnosis not present

## 2018-02-27 DIAGNOSIS — E1121 Type 2 diabetes mellitus with diabetic nephropathy: Secondary | ICD-10-CM | POA: Diagnosis not present

## 2018-02-27 DIAGNOSIS — E875 Hyperkalemia: Secondary | ICD-10-CM

## 2018-02-27 DIAGNOSIS — C9110 Chronic lymphocytic leukemia of B-cell type not having achieved remission: Secondary | ICD-10-CM

## 2018-02-27 LAB — RENAL FUNCTION PANEL
Albumin: 4.2 g/dL (ref 3.5–5.2)
BUN: 38 mg/dL — ABNORMAL HIGH (ref 6–23)
CO2: 25 meq/L (ref 19–32)
CREATININE: 1.62 mg/dL — AB (ref 0.40–1.50)
Calcium: 9.9 mg/dL (ref 8.4–10.5)
Chloride: 109 mEq/L (ref 96–112)
GFR: 44.05 mL/min — ABNORMAL LOW (ref >60.00)
Glucose, Bld: 145 mg/dL — ABNORMAL HIGH (ref 70–99)
Phosphorus: 3.3 mg/dL (ref 2.3–4.6)
Potassium: 5 mEq/L (ref 3.5–5.1)
Sodium: 144 mEq/L (ref 135–145)

## 2018-02-27 MED ORDER — NITROGLYCERIN 0.4 MG SL SUBL: 0.4000 mg | SUBLINGUAL_TABLET | SUBLINGUAL | 0 refills | Status: DC | PRN

## 2018-02-27 MED ORDER — INSULIN ASPART 100 UNIT/ML FLEXPEN: PEN_INJECTOR | SUBCUTANEOUS | 3 refills | Status: DC

## 2018-02-27 NOTE — Patient Instructions (Addendum)
Labs today.  Stop lisinopril for now.  Increase levothyroxine to 26mcg daily - new dose at pharmacy. New insulin regimen - take 5 units novolog with all meals, if sugars >250 when you check with meal, then take 10 units. Continue levemir 25u at night time.  Call insurance to ask about continuous glucose meter -as you do qualify for it with how much insulin shots you do and how often you're checking. Let me know what you find out.  If interested, check with pharmacy about new 2 shot shingles series (shingrix).  Bring in advanced directive when complete/notarized.  Consider decreasing aspirin frequency - check with cardiology.  We will check carotid ultrasound

## 2018-02-27 NOTE — Progress Notes (Deleted)
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  INTERVAL HISTORY: Patient returns to clinic today for repeat laboratory, further evaluation, and initiation of Nplate.  He continues to feel well and remains asymptomatic. He denies any easy bleeding or bruising. He has no neurologic complaints.  He has a good appetite and denies weight loss. He denies any night sweats. He has noted no lymphadenopathy. He has no chest pain or shortness of breath. He denies any nausea, vomiting, constipation, or diarrhea. He has no urinary complaints.  Patient feels at his baseline offers no specific complaints today.  REVIEW OF SYSTEMS:   Review of Systems  Constitutional: Negative.  Negative for fever, malaise/fatigue and weight loss.  Respiratory: Negative.  Negative for cough and shortness of breath.   Cardiovascular: Negative.  Negative for chest pain and leg swelling.  Gastrointestinal: Negative.  Negative for abdominal pain and blood in stool.  Genitourinary: Negative.  Negative for dysuria and hematuria.  Musculoskeletal: Negative.  Negative for back pain and neck pain.  Skin: Negative.  Negative for itching and rash.  Neurological: Negative.  Negative for sensory change, focal weakness, weakness and headaches.  Endo/Heme/Allergies: Does not bruise/bleed easily.  Psychiatric/Behavioral: Negative.  The patient is not nervous/anxious.     As per HPI. Otherwise, a complete review of systems is negative.  PAST MEDICAL HISTORY: Past  Medical History:  Diagnosis Date  . Acquired hand deformity 1962   hand saw accident at work  . Anemia 10/11/2011  . Arthritis   . Bradycardia 10/10/2011  . CAD (coronary artery disease) 10/10/2011   MI s/p PTCA (Dx-OM2 proximal concentric stenosis)  . CKD (chronic kidney disease) stage 3, GFR 30-59 ml/min (HCC)    [name]  . CLL (chronic lymphocytic leukemia) (Scott [name])   . Colon polyps   . Generalized headaches    frequent  . GI bleed 07/08/2017  . Glaucoma    s/p laser surgery  . HLD (hyperlipidemia)   . Hypertension   . Hypothyroidism 10/10/2011  . Thrombocytopenia ([place]) 10/11/2011  . Type 2 diabetes with nephropathy [facility])    DM refresher course [facility] (04/2013)    PAST SURGICAL HISTORY: Past Surgical History:  Procedure Laterality Date  . BACK SURGERY     cervical neck  . CATARACT EXTRACTION, BILATERAL Bilateral 2017  . COLONOSCOPY  11/2008   1 polyp, diverticulosis, rec rpt 5 yrs (Dr. Oletta Lamas, [name])  . COLONOSCOPY  06/2014   hyperplastic polyp, rpt 5 yrs ([place])  . COLONOSCOPY WITH PROPOFOL N/A 11/17/2017   TA, HP, (Vanga, Tally Due, MD)  . ESOPHAGOGASTRODUODENOSCOPY (EGD) WITH PROPOFOL N/A 11/17/2017   healing erosive gastritis, intestinal metaplasia, neg H pylori (Vanga, Tally Due, MD)  . EYE SURGERY  2012   laser surgery for glaucoma  . PTCA  1994, 1995  . US ECHOCARDIOGRAPHY  10/2013   inferior wall hypokinesis, mild LVH, EF 50-55%, mild MR and LA dilation    FAMILY HISTORY: Family History  Problem Relation Age of Onset  . Diabetes Sister   . Stomach cancer Sister 35  . Pneumonia Father  caused death  . Other Brother        no communication with brother so unsure of any health conditions  . Coronary artery disease Son 43       5v CABG and stents  . Hyperlipidemia Sister   . Stroke Neg Hx   . Heart attack Neg Hx     ADVANCED DIRECTIVES (Y/N):  N  HEALTH MAINTENANCE: Social History   Tobacco Use  . Smoking status: Former Smoker    Last attempt to  quit: 03/04/1978    Years since quitting: 40.0  . Smokeless tobacco: Never Used  Substance Use Topics  . Alcohol use: No  . Drug use: No     Colonoscopy:  PAP:  Bone density:  Lipid panel:  No Known Allergies  Current Outpatient Medications  Medication Sig Dispense Refill  . acetaminophen (TYLENOL) 650 MG CR tablet Take 650 mg by mouth every 8 (eight) hours as needed for pain.    Marland Kitchen aspirin EC 81 MG tablet Take 1 tablet (81 mg total) by mouth daily. 90 tablet 3  . atorvastatin (LIPITOR) 80 MG tablet Take 1 tablet (80 mg total) by mouth daily. 90 tablet 3  . carvedilol (COREG) 6.25 MG tablet TAKE 1 TABLET BY MOUTH TWICE DAILY 180 tablet 3  . Continuous Blood Gluc Receiver (FREESTYLE LIBRE 14 DAY READER) DEVI 1 Device by Does not apply route as directed. Patient gives insulin and checks sugars 4 times a day. E11.21 1 Device 0  . Continuous Blood Gluc Sensor (FREESTYLE LIBRE 14 DAY SENSOR) MISC 1 Device by Does not apply route as directed. Patient gives insulin and checks sugars 4 times a day. E11.21 6 each 3  . furosemide (LASIX) 20 MG tablet Take 1 tablet (20 mg total) by mouth daily. 30 tablet 4  . glucose blood (ONE TOUCH ULTRA TEST) test strip 1 each by Other route 4 (four) times daily. and as needed to check sugars Dx code:  E11.21 400 each 3  . insulin aspart (NOVOLOG) 100 UNIT/ML FlexPen Take 5 units with meals, take 10 units if mealtime sugar >250 15 mL 3  . Insulin Detemir (LEVEMIR FLEXTOUCH) 100 UNIT/ML Pen INJECT 25 UNITS SUBCUTANEOUSLY ONCE DAILY AT  10PM 15 pen 3  . levothyroxine (SYNTHROID, LEVOTHROID) 50 MCG tablet TAKE 1 TABLET BY MOUTH ONCE DAILY 90 tablet 2  . niacin (SLO-NIACIN) 500 MG tablet Take 1,000 mg by mouth at bedtime.     . nitroGLYCERIN (NITROSTAT) 0.4 MG SL tablet Place 1 tablet (0.4 mg total) under the tongue every 5 (five) minutes as needed for chest pain ((MAX of 3 doses)). 25 tablet 0  . pantoprazole (PROTONIX) 40 MG tablet Take 1 tablet (40 mg total) by  mouth 2 (two) times daily before a meal. 180 tablet 0  . traZODone (DESYREL) 50 MG tablet Take 0.5-1 tablets (25-50 mg total) by mouth at bedtime as needed for sleep. 90 tablet 1   No current facility-administered medications for this visit.     OBJECTIVE: There were no vitals filed for this visit.   There is no height or weight on file to calculate BMI.    ECOG FS:0 - Asymptomatic  General: Well-developed, well-nourished, no acute distress. Eyes: Pink conjunctiva, anicteric sclera. HEENT: Normocephalic, moist mucous membranes. Lungs: Clear to auscultation bilaterally. Heart: Regular rate and rhythm. No rubs, murmurs, or gallops. Abdomen: Soft, nontender, nondistended. No organomegaly noted, normoactive bowel sounds. Musculoskeletal: No edema, cyanosis, or clubbing. Neuro: Alert, answering all questions appropriately.  Cranial nerves grossly intact. Skin: No rashes or petechiae noted. Psych: Normal affect.  LAB RESULTS:  Lab Results  Component Value Date   NA 143 02/20/2018   K 5.4 (H) 02/20/2018   CL 109 02/20/2018   CO2 27 02/20/2018   GLUCOSE 263 (H) 02/20/2018   BUN 42 (H) 02/20/2018   CREATININE 1.80 (H) 02/20/2018   CALCIUM 10.3 02/20/2018   PROT 6.8 02/20/2018   ALBUMIN 4.3 02/20/2018   AST 23 02/20/2018   ALT 13 02/20/2018   ALKPHOS 61 02/20/2018   BILITOT 1.1 02/20/2018   GFRNONAA 36 (L) 08/23/2017   GFRAA 42 (L) 08/23/2017    Lab Results  Component Value Date   WBC 6.2 02/16/2018   NEUTROABS 1.3 (L) 02/16/2018   HGB 8.3 (L) 02/16/2018   HCT 26.3 (L) 02/16/2018   MCV 100.8 (H) 02/16/2018   PLT 30 (L) 02/16/2018     STUDIES: No results found.  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  PLAN:    1. CLL: Bone marrow biopsy completed on October 23, 2017 reviewed independently with involvement with CLL, unclear percentage of involvement.  Although patient was reported to have normal cytogenetics he was noted to have a 50% incidence  of mutation in the ATM gene which is commonly associated with deletion 11q. 11q- is associated with an unfavorable prognosis and high risk of not responding to initial treatment. 13q-is a more common mutation and is actually associated with a more favorable prognosis. Patient's white blood cell count continues to be within normal limits.  He received his last infusion of single agent Rituxan on September 03, 2017.  No further intervention is needed.  If patient requires additional treatment could possibly transition him to [place].   2.  Thrombocytopenia: Patient's platelet count has slightly improved and are now 37. 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  Patient has received high-dose prednisone, IVIG, and Rituxan with no appreciable durability to improve his platelet count.  Proceed with Nplate injection today.  Return to clinic in 2 weeks for laboratory work only and then in 4 weeks for laboratory work, further evaluation, and continuation of Nplate.  3.  Anemia: Decreased and essentially stable at 10.2.  Patient had colonoscopy on November 17, 2017 that removed 2 nonmalignant polyps.  EGD on the same day revealed nonbleeding erosive gastropathy with multiple nonbleeding duodenal ulcers.   4.  Reaction to Rituxan: Rate-based.  Patient will require premedications for any further infusions with Rituxan.    I spent a total of 30 minutes face-to-face with the patient of which greater than 50% of the visit was spent in counseling and coordination of care as detailed above.   Patient expressed understanding and was in agreement with this plan. He also understands that He can call clinic at any time with any questions, concerns, or complaints.    Lloyd Huger, MD   02/27/2018 11:00 AM

## 2018-02-28 DIAGNOSIS — E875 Hyperkalemia: Secondary | ICD-10-CM

## 2018-02-28 HISTORY — DX: Hyperkalemia: E87.5

## 2018-02-28 MED ORDER — LEVOTHYROXINE SODIUM 75 MCG PO TABS: 75.0000 ug | ORAL_TABLET | Freq: Every day | ORAL | 1 refills | Status: DC

## 2018-02-28 NOTE — Assessment & Plan Note (Signed)
Sees nephrologist. Cr have recently bumped. See below

## 2018-02-28 NOTE — Assessment & Plan Note (Addendum)
Chronic, overall stable readings. Continue carvedilol, lasix. See below for lisinopril plan.

## 2018-02-28 NOTE — Assessment & Plan Note (Signed)
Followed by onc - appreciate their care.

## 2018-02-28 NOTE — Assessment & Plan Note (Addendum)
Acutely worsening followed by heme, has appt on Monday. Given low plt count, I did suggest he back off aspirin, to check with cards in stent hx.

## 2018-02-28 NOTE — Assessment & Plan Note (Addendum)
TSH elevated - will increase levothyroxine to 40mcg daily.

## 2018-02-28 NOTE — Assessment & Plan Note (Addendum)
Chronic, stable. On niacin and lipitor. Consider stopping niacin in brittle diabetes history.  The ASCVD Risk score Mikey Bussing DC Jr., et al., 2013) failed to calculate for the following reasons:   The valid total cholesterol range is 130 to 320 mg/dL

## 2018-02-28 NOTE — Assessment & Plan Note (Signed)
Recent K up to 5.4 so we held lisinopril. Will recheck labs, stay off ACEI for now.

## 2018-02-28 NOTE — Assessment & Plan Note (Addendum)
Advanced directives -previously wantedfull codebut with wife's death experience, likely would change decision. Has packet at home, needs to get it notarized. Will bring [place] copy when complete. HCPOA likely 2 sons.

## 2018-02-28 NOTE — Assessment & Plan Note (Addendum)
Chronic brittle diabetes - sugar log reviewed with wide fluctuations 110-400s. Latest A1c was worse at 9.5%. Again reviewed correct timing for novolog to take prior to meals as well as importance of scheduled mealtimes. He normally has breakfast and lunch and tends to not eat dinner. Planning to schedule diabetes refresher course with nutritionist. No further lows since we changed novolog to just with meals - he was previously likely overcorrecting for high sugars too soon between novolog injections. Will again change regimen to 5u with meals but increase to 10u if cbg >250. RTC 1 mo f/u visit. Pt agrees with plan.  We sent in CGM but pt states was unaffordable. I asked him to call insurance as I do think he qualifies for this with good insurance coverage given frequency of sugars checked.

## 2018-03-02 ENCOUNTER — Inpatient Hospital Stay: Payer: Medicare Other | Admitting: Oncology

## 2018-03-02 ENCOUNTER — Inpatient Hospital Stay: Payer: Medicare Other

## 2018-03-02 ENCOUNTER — Encounter: Payer: Self-pay | Admitting: Oncology

## 2018-03-02 ENCOUNTER — Inpatient Hospital Stay (HOSPITAL_BASED_OUTPATIENT_CLINIC_OR_DEPARTMENT_OTHER): Payer: Medicare Other | Admitting: Oncology

## 2018-03-02 VITALS — BP 126/66 | HR 66 | Temp 97.4°F | Resp 18 | Wt 147.9 lb

## 2018-03-02 DIAGNOSIS — D693 Immune thrombocytopenic purpura: Secondary | ICD-10-CM

## 2018-03-02 DIAGNOSIS — C9110 Chronic lymphocytic leukemia of B-cell type not having achieved remission: Secondary | ICD-10-CM

## 2018-03-02 DIAGNOSIS — I2510 Atherosclerotic heart disease of native coronary artery without angina pectoris: Secondary | ICD-10-CM

## 2018-03-02 DIAGNOSIS — I129 Hypertensive chronic kidney disease with stage 1 through stage 4 chronic kidney disease, or unspecified chronic kidney disease: Secondary | ICD-10-CM

## 2018-03-02 DIAGNOSIS — N183 Chronic kidney disease, stage 3 (moderate): Secondary | ICD-10-CM

## 2018-03-02 DIAGNOSIS — D649 Anemia, unspecified: Secondary | ICD-10-CM | POA: Diagnosis not present

## 2018-03-02 DIAGNOSIS — E1122 Type 2 diabetes mellitus with diabetic chronic kidney disease: Secondary | ICD-10-CM

## 2018-03-02 LAB — CBC WITH DIFFERENTIAL/PLATELET
Abs Immature Granulocytes: 0.06 10*3/uL (ref 0.00–0.07)
BASOS PCT: 0 %
Basophils Absolute: 0 10*3/uL (ref 0.0–0.1)
Eosinophils Absolute: 0.2 10*3/uL (ref 0.0–0.5)
Eosinophils Relative: 1 %
HCT: 22.7 % — ABNORMAL LOW (ref 39.0–52.0)
Hemoglobin: 7.3 g/dL — ABNORMAL LOW (ref 13.0–17.0)
Immature Granulocytes: 1 %
Lymphocytes Relative: 68 %
Lymphs Abs: 9.1 10*3/uL — ABNORMAL HIGH (ref 0.7–4.0)
MCH: 31.9 pg (ref 26.0–34.0)
MCHC: 32.2 g/dL (ref 30.0–36.0)
MCV: 99.1 fL (ref 80.0–100.0)
Monocytes Absolute: 0.6 10*3/uL (ref 0.1–1.0)
Monocytes Relative: 5 %
NRBC: 0 % (ref 0.0–0.2)
Neutro Abs: 3.4 10*3/uL (ref 1.7–7.7)
Neutrophils Relative %: 25 %
Platelets: 21 10*3/uL — CL (ref 150–400)
RBC: 2.29 MIL/uL — AB (ref 4.22–5.81)
RDW: 15.1 % (ref 11.5–15.5)
WBC: 13.3 10*3/uL — AB (ref 4.0–10.5)

## 2018-03-02 MED ORDER — ROMIPLOSTIM 250 MCG ~~LOC~~ SOLR
2.0000 ug/kg | Freq: Once | SUBCUTANEOUS | Status: AC
  Administered 2018-03-02: 135 ug via SUBCUTANEOUS
  Filled 2018-03-02: qty 0.27

## 2018-03-02 NOTE — Progress Notes (Signed)
Patient here today for follow up regarding CLL, thrombocytopenia. Patient reports that he has a cold the last few days, productive cough with green sputum.

## 2018-03-02 NOTE — Progress Notes (Deleted)
Platelets results of 21 today (verbal given by Deirdre Peer, RN

## 2018-03-02 NOTE — Progress Notes (Signed)
[place]  Telephone:(336) [phone] Fax:(336) [phone]  ID: [name] OB: 02-11-1941  MR#: [id number]  VEL#:[id number]  Patient Care Team: [name], MD as PCP - General (Family Medicine) Lloyd Huger, MD as Consulting Physician (Oncology) Clent Jacks, MD as Consulting Physician (Ophthalmology) Dorothy Spark, MD as Consulting Physician (Cardiology) Madelon Lips, MD as Consulting Physician (Nephrology)  CHIEF COMPLAINT: 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  INTERVAL HISTORY: Patient returns to clinic today for repeat laboratory work, further evaluation, and consideration of additional Nplate.  He complains of increased weakness and occasional balance issues, but denies dizziness.  He otherwise feels well.  He denies any easy bleeding or bruising. He has no other neurologic complaints.  He has a good appetite and denies weight loss. He denies any night sweats. He has noted no lymphadenopathy. He has no chest pain or shortness of breath. He denies any nausea, vomiting, constipation, or diarrhea. He has no urinary complaints.  Patient offers no further specific complaints today.  REVIEW OF SYSTEMS:   Review of Systems  Constitutional: Negative.  Negative for fever, malaise/fatigue and weight loss.  Respiratory: Negative.  Negative for cough and shortness of breath.   Cardiovascular: Negative.  Negative for chest pain and leg swelling.  Gastrointestinal: Negative.  Negative for abdominal pain and blood in stool.  Genitourinary: Negative.  Negative for dysuria and hematuria.  Musculoskeletal: Negative.  Negative for back pain and neck pain.  Skin: Negative.  Negative for itching and rash.  Neurological: Positive for weakness. Negative for sensory change, focal weakness and headaches.  Endo/Heme/Allergies: Does not bruise/bleed easily.  Psychiatric/Behavioral: Negative.  The patient is not nervous/anxious.     As per HPI. Otherwise, a  complete review of systems is negative.  PAST MEDICAL HISTORY: Past Medical History:  Diagnosis Date  . Acquired hand deformity 1962   hand saw accident at work  . Anemia 10/11/2011  . Arthritis   . Bradycardia 10/10/2011  . CAD (coronary artery disease) 10/10/2011   MI s/p PTCA (Dx-OM2 proximal concentric stenosis)  . CKD (chronic kidney disease) stage 3, GFR 30-59 ml/min (HCC)    [name]  . CLL (chronic lymphocytic leukemia) ([place])   . Colon polyps   . Generalized headaches    frequent  . GI bleed 07/08/2017  . Glaucoma    s/p laser surgery  . HLD (hyperlipidemia)   . Hypertension   . Hypothyroidism 10/10/2011  . Thrombocytopenia ([place]) 10/11/2011  . Type 2 diabetes with nephropathy [name])    DM refresher course [facility] (04/2013)    PAST SURGICAL HISTORY: Past Surgical History:  Procedure Laterality Date  . BACK SURGERY     cervical neck  . CATARACT EXTRACTION, BILATERAL Bilateral 2017  . COLONOSCOPY  11/2008   1 polyp, diverticulosis, rec rpt 5 yrs (Dr. Oletta Lamas, [name])  . COLONOSCOPY  06/2014   hyperplastic polyp, rpt 5 yrs ([place])  . COLONOSCOPY WITH PROPOFOL N/A 11/17/2017   TA, HP, (Vanga, Tally Due, MD)  . ESOPHAGOGASTRODUODENOSCOPY (EGD) WITH PROPOFOL N/A 11/17/2017   healing erosive gastritis, intestinal metaplasia, neg H pylori (Vanga, Tally Due, MD)  . EYE SURGERY  2012   laser surgery for glaucoma  . PTCA  1994, 1995  . US ECHOCARDIOGRAPHY  10/2013   inferior wall hypokinesis, mild LVH, EF 50-55%, mild MR and LA dilation    FAMILY HISTORY: Family History  Problem Relation Age of Onset  . Diabetes Sister   . Stomach cancer Sister 10  .  Pneumonia Father        caused death  . Other Brother        no communication with brother so unsure of any health conditions  . Coronary artery disease Son 71       5v CABG and stents  . Hyperlipidemia Sister   . Stroke Neg Hx   . Heart attack Neg Hx     ADVANCED DIRECTIVES (Y/N):  N  HEALTH MAINTENANCE: Social History    Tobacco Use  . Smoking status: Former Smoker    Last attempt to quit: 03/04/1978    Years since quitting: 40.0  . Smokeless tobacco: Never Used  Substance Use Topics  . Alcohol use: No  . Drug use: No     Colonoscopy:  PAP:  Bone density:  Lipid panel:  No Known Allergies  Current Outpatient Medications  Medication Sig Dispense Refill  . acetaminophen (TYLENOL) 650 MG CR tablet Take 650 mg by mouth every 8 (eight) hours as needed for pain.    Marland Kitchen aspirin EC 81 MG tablet Take 1 tablet (81 mg total) by mouth daily. 90 tablet 3  . atorvastatin (LIPITOR) 80 MG tablet Take 1 tablet (80 mg total) by mouth daily. 90 tablet 3  . carvedilol (COREG) 6.25 MG tablet TAKE 1 TABLET BY MOUTH TWICE DAILY 180 tablet 3  . Continuous Blood Gluc Receiver (FREESTYLE LIBRE 14 DAY READER) DEVI 1 Device by Does not apply route as directed. Patient gives insulin and checks sugars 4 times a day. E11.21 1 Device 0  . Continuous Blood Gluc Sensor (FREESTYLE LIBRE 14 DAY SENSOR) MISC 1 Device by Does not apply route as directed. Patient gives insulin and checks sugars 4 times a day. E11.21 6 each 3  . glucose blood (ONE TOUCH ULTRA TEST) test strip 1 each by Other route 4 (four) times daily. and as needed to check sugars Dx code:  E11.21 400 each 3  . insulin aspart (NOVOLOG) 100 UNIT/ML FlexPen Take 5 units with meals, take 10 units if mealtime sugar >250 15 mL 3  . Insulin Detemir (LEVEMIR FLEXTOUCH) 100 UNIT/ML Pen INJECT 25 UNITS SUBCUTANEOUSLY ONCE DAILY AT  10PM 15 pen 3  . levothyroxine (SYNTHROID, LEVOTHROID) 75 MCG tablet Take 1 tablet (75 mcg total) by mouth daily. 90 tablet 1  . nitroGLYCERIN (NITROSTAT) 0.4 MG SL tablet Place 1 tablet (0.4 mg total) under the tongue every 5 (five) minutes as needed for chest pain ((MAX of 3 doses)). 25 tablet 0  . pantoprazole (PROTONIX) 40 MG tablet Take 1 tablet (40 mg total) by mouth 2 (two) times daily before a meal. 180 tablet 0  . traZODone (DESYREL) 50 MG tablet  Take 0.5-1 tablets (25-50 mg total) by mouth at bedtime as needed for sleep. 90 tablet 1  . furosemide (LASIX) 20 MG tablet Take 1 tablet (20 mg total) by mouth daily. 30 tablet 4   No current facility-administered medications for this visit.     OBJECTIVE: Vitals:   03/02/18 0903  BP: 126/66  Pulse: 66  Resp: 18  Temp: (!) 97.4 F (36.3 C)     Body mass index is 20.34 kg/m.    ECOG FS:0 - Asymptomatic  General: Well-developed, well-nourished, no acute distress. Eyes: Pink conjunctiva, anicteric sclera. HEENT: Normocephalic, moist mucous membranes. Lungs: Clear to auscultation bilaterally. Heart: Regular rate and rhythm. No rubs, murmurs, or gallops. Abdomen: Soft, nontender, nondistended. No organomegaly noted, normoactive bowel sounds. Musculoskeletal: No edema, cyanosis, or clubbing. Neuro: Alert,  answering all questions appropriately. Cranial nerves grossly intact. Skin: No rashes or petechiae noted. Psych: Normal affect. Lymphatics: No cervical, calvicular, axillary or inguinal LAD. LAB RESULTS:  Lab Results  Component Value Date   NA 144 02/27/2018   K 5.0 02/27/2018   CL 109 02/27/2018   CO2 25 02/27/2018   GLUCOSE 145 (H) 02/27/2018   BUN 38 (H) 02/27/2018   CREATININE 1.62 (H) 02/27/2018   CALCIUM 9.9 02/27/2018   PROT 6.8 02/20/2018   ALBUMIN 4.2 02/27/2018   AST 23 02/20/2018   ALT 13 02/20/2018   ALKPHOS 61 02/20/2018   BILITOT 1.1 02/20/2018   GFRNONAA 36 (L) 08/23/2017   GFRAA 42 (L) 08/23/2017    Lab Results  Component Value Date   WBC 13.3 (H) 03/02/2018   NEUTROABS 3.4 03/02/2018   HGB 7.3 (L) 03/02/2018   HCT 22.7 (L) 03/02/2018   MCV 99.1 03/02/2018   PLT 21 (LL) 03/02/2018     STUDIES: No results found.  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  PLAN:    1. CLL: Bone marrow biopsy completed on October 23, 2017 reviewed independently with involvement with CLL, unclear percentage of involvement.  He was noted  to have a 50% incidence of mutation in the ATM gene which is commonly associated with deletion 11q. 11q- is associated with an unfavorable prognosis and high risk of not responding to initial treatment. 13q-is a more common mutation and is actually associated with a more favorable prognosis.  Patient's white blood cell count is mildly elevated today at 13.3.  He received his last infusion of single agent Rituxan on September 03, 2017.  No further intervention is needed.  If patient requires additional treatment could possibly transition him to [place].   2.  Thrombocytopenia: Patient's platelet count has decreased to 21. 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  Patient has received high-dose prednisone, IVIG, and Rituxan with no appreciable durability to improve his platelet count.  We will increase Nplate dose of 2 mcg/kg.  Return to clinic in 2 weeks for laboratory work and consideration of Nplate and then in 4 weeks further evaluation.  3.  Anemia: Patient's hemoglobin is trending down and may be related to his balance problem.  He does not require transfusion at this time, but if he trends down below 7.0 would consider one at that point.  Return to clinic as above.  Of note, patient had colonoscopy on November 17, 2017 that removed 2 nonmalignant polyps.  EGD on the same day revealed nonbleeding erosive gastropathy with multiple nonbleeding duodenal ulcers.   4.  Reaction to Rituxan: Rate-based.  Patient will require premedications for any further infusions with Rituxan. 5.  Weakness/balance: Patient reports primary care has ordered carotid Dopplers.  Monitor hemoglobin as above.  Patient expressed understanding and was in agreement with this plan. He also understands that He can call clinic at any time with any questions, concerns, or complaints.    Lloyd Huger, MD   03/02/2018 3:41 PM

## 2018-03-16 ENCOUNTER — Inpatient Hospital Stay: Payer: Medicare Other | Attending: Oncology

## 2018-03-16 ENCOUNTER — Inpatient Hospital Stay: Payer: Medicare Other

## 2018-03-16 DIAGNOSIS — C9110 Chronic lymphocytic leukemia of B-cell type not having achieved remission: Secondary | ICD-10-CM | POA: Diagnosis not present

## 2018-03-16 DIAGNOSIS — D649 Anemia, unspecified: Secondary | ICD-10-CM | POA: Diagnosis not present

## 2018-03-16 DIAGNOSIS — N183 Chronic kidney disease, stage 3 (moderate): Secondary | ICD-10-CM | POA: Insufficient documentation

## 2018-03-16 DIAGNOSIS — D693 Immune thrombocytopenic purpura: Secondary | ICD-10-CM

## 2018-03-16 DIAGNOSIS — Z79899 Other long term (current) drug therapy: Secondary | ICD-10-CM | POA: Diagnosis not present

## 2018-03-16 DIAGNOSIS — I2510 Atherosclerotic heart disease of native coronary artery without angina pectoris: Secondary | ICD-10-CM | POA: Insufficient documentation

## 2018-03-16 DIAGNOSIS — Z87891 Personal history of nicotine dependence: Secondary | ICD-10-CM | POA: Insufficient documentation

## 2018-03-16 DIAGNOSIS — Z7982 Long term (current) use of aspirin: Secondary | ICD-10-CM | POA: Diagnosis not present

## 2018-03-16 DIAGNOSIS — Z794 Long term (current) use of insulin: Secondary | ICD-10-CM | POA: Diagnosis not present

## 2018-03-16 DIAGNOSIS — E1122 Type 2 diabetes mellitus with diabetic chronic kidney disease: Secondary | ICD-10-CM | POA: Diagnosis not present

## 2018-03-16 DIAGNOSIS — I129 Hypertensive chronic kidney disease with stage 1 through stage 4 chronic kidney disease, or unspecified chronic kidney disease: Secondary | ICD-10-CM | POA: Insufficient documentation

## 2018-03-16 LAB — CBC WITH DIFFERENTIAL/PLATELET
Abs Immature Granulocytes: 0.03 10*3/uL (ref 0.00–0.07)
Basophils Absolute: 0 10*3/uL (ref 0.0–0.1)
Basophils Relative: 0 %
Eosinophils Absolute: 0.1 10*3/uL (ref 0.0–0.5)
Eosinophils Relative: 1 %
HEMATOCRIT: 20.6 % — AB (ref 39.0–52.0)
Hemoglobin: 6.6 g/dL — ABNORMAL LOW (ref 13.0–17.0)
Immature Granulocytes: 0 %
Lymphocytes Relative: 57 %
Lymphs Abs: 4.1 10*3/uL — ABNORMAL HIGH (ref 0.7–4.0)
MCH: 32.7 pg (ref 26.0–34.0)
MCHC: 32 g/dL (ref 30.0–36.0)
MCV: 102 fL — AB (ref 80.0–100.0)
MONOS PCT: 6 %
Monocytes Absolute: 0.5 10*3/uL (ref 0.1–1.0)
Neutro Abs: 2.7 10*3/uL (ref 1.7–7.7)
Neutrophils Relative %: 36 %
Platelets: 50 10*3/uL — ABNORMAL LOW (ref 150–400)
RBC: 2.02 MIL/uL — ABNORMAL LOW (ref 4.22–5.81)
RDW: 14.7 % (ref 11.5–15.5)
WBC: 7.4 10*3/uL (ref 4.0–10.5)
nRBC: 0 % (ref 0.0–0.2)

## 2018-03-16 LAB — SAMPLE TO BLOOD BANK

## 2018-03-16 MED ORDER — ROMIPLOSTIM 250 MCG ~~LOC~~ SOLR
135.0000 ug | Freq: Once | SUBCUTANEOUS | Status: AC
  Administered 2018-03-16: 135 ug via SUBCUTANEOUS
  Filled 2018-03-16: qty 0.27

## 2018-03-18 ENCOUNTER — Encounter: Payer: Self-pay | Admitting: Oncology

## 2018-03-18 ENCOUNTER — Telehealth: Payer: Self-pay

## 2018-03-18 NOTE — Telephone Encounter (Signed)
Pt called and has appt 03/26/18 for [place] of neck with FU with Dr Darnell Level on 03/30/18. Pt said when up walking has severe pain in back of neck  But when pt sits down the pain goes away. Pt said yrs ago pt had surgery on back of neck where hurting on and off now. Pt wants to know if carotid US will tell anything about pain in back of neck. Pt request cb.

## 2018-03-18 NOTE — Telephone Encounter (Signed)
Carotid ultrasound will unfortunately not evaluate cervical neck pain as it only checks neck arteries.  If ongoing severe pain, recommend office visit.

## 2018-03-19 ENCOUNTER — Encounter: Payer: Medicare Other | Attending: Family Medicine | Admitting: Dietician

## 2018-03-19 ENCOUNTER — Encounter: Payer: Self-pay | Admitting: Dietician

## 2018-03-19 VITALS — Ht 71.0 in | Wt 148.2 lb

## 2018-03-19 DIAGNOSIS — E1165 Type 2 diabetes mellitus with hyperglycemia: Secondary | ICD-10-CM | POA: Diagnosis not present

## 2018-03-19 DIAGNOSIS — Z794 Long term (current) use of insulin: Secondary | ICD-10-CM

## 2018-03-19 DIAGNOSIS — E1121 Type 2 diabetes mellitus with diabetic nephropathy: Secondary | ICD-10-CM | POA: Insufficient documentation

## 2018-03-19 DIAGNOSIS — Z713 Dietary counseling and surveillance: Secondary | ICD-10-CM | POA: Insufficient documentation

## 2018-03-19 DIAGNOSIS — Z6820 Body mass index (BMI) 20.0-20.9, adult: Secondary | ICD-10-CM | POA: Insufficient documentation

## 2018-03-19 NOTE — Progress Notes (Signed)
Medical Nutrition Therapy: Visit start time: 1330  end time: 1430  Assessment:  Diagnosis: Type 2 diabetes, weight loss Past medical history: HTN, hyperlipidemia, CHF, leukemia Psychosocial issues/ stress concerns: none  Preferred learning method:  . Auditory  Current weight: 148.2lbs Height: 5'11" Medications, supplements: reconciled list in medical record  Progress and evaluation: Patient reports normal weight of about 175lbs, but has experienced decreased appetite in recent months and has gradually lost weight. He is testing BGs regularly; fasting and am BGs in good control, but pm BGs are typically 200-300s. He feels he might be overeating some carb portions with evening meal, most days he eats very little during the day, after a small breakfast.  He used to walk on his treadmill but is now concerned that he will lose more weight if he does exercise.  Physical activity: walking maybe 15 minutes 2x a week  Dietary Intake:  Usual eating pattern includes 2-3 meals and 1-2 snacks per day. Dining out frequency: 5-7 meals per week.  Breakfast: cereal cup with milk; Glucerna 1/2; egg and pc of toast; sometimes out Snack: occasional 1/2 oatmeal cream pie snack cake or 1/2 mini pecan pie Lunch: pkg crackers,or ritz crackers with peanut butter; usually no appetite Snack: low sugar ice cream or none Supper: chicken breast + sweet potato + few hush puppies; 1 small boiled potato with butter; 1/2 glucerna shake if not hungry Snack: none Beverages: diet coke, does not like much water  Nutrition Care Education: Topics covered: diabetes, weight gain Basic nutrition: basic food groups, appropriate nutrient balance, appropriate meal and snack schedule, general nutrition guidelines    Weight gain: importance of eating at regular intervals, including protein with meals and snacks, increasing healthy fats, using light exercise to stimulate appetite Diabetes: appropriate meal and snack schedule,  appropriate carb intake and balance with protein, basic meal planning using plate method and pre-printed menus  Nutritional Diagnosis:  [place]-2.2 Altered nutrition-related laboratory As related to type 2 diabetes.  As evidenced by patient with recent HbA1C of 9.5%. [place]-3.2 Unintentional weight loss As related to poor appetite.  As evidenced by patient report of 25-30lb weight loss, with current BMI of 20.7.  Intervention:   Instruction as noted above.  Patient has added Glucerna shakes to improve nutritional intake.  Established goals to eat at regular intervals and improve nutritional balance in meals.   Patient declined scheduling a follow-up visit but will schedule later if needed.   Will plan to call patient in several weeks to check on progress.  Education Materials given:  . Plate Planner with food lists . Sample meal pattern/ menus . Snacking handout . Goals/ instructions   Learner/ who was taught:  . Patient   Level of understanding: Marland Kitchen Verbalizes/ demonstrates competency   Demonstrated degree of understanding via:   Teach back Learning barriers: . None  Willingness to learn/ readiness for change: . Eager, change in progress   Monitoring and Evaluation:  Dietary intake, exercise, BG control, and body weight      follow up: prn

## 2018-03-19 NOTE — Telephone Encounter (Signed)
Left message on vm per dpr relaying Dr. G's message.  

## 2018-03-19 NOTE — Patient Instructions (Signed)
   Eat a meal or snack every 3-5 hours during the day. Set an alarm to eat if needed.   Control portions of starchy foods like crackers, breads, etc. And include some protein with it like low fat (light, 2%) cheese, eggs, peanut butter or nuts.

## 2018-03-24 DIAGNOSIS — H02834 Dermatochalasis of left upper eyelid: Secondary | ICD-10-CM | POA: Diagnosis not present

## 2018-03-24 DIAGNOSIS — H02831 Dermatochalasis of right upper eyelid: Secondary | ICD-10-CM | POA: Diagnosis not present

## 2018-03-24 DIAGNOSIS — H401132 Primary open-angle glaucoma, bilateral, moderate stage: Secondary | ICD-10-CM | POA: Diagnosis not present

## 2018-03-24 DIAGNOSIS — E119 Type 2 diabetes mellitus without complications: Secondary | ICD-10-CM | POA: Diagnosis not present

## 2018-03-24 DIAGNOSIS — Z961 Presence of intraocular lens: Secondary | ICD-10-CM | POA: Diagnosis not present

## 2018-03-26 ENCOUNTER — Ambulatory Visit (INDEPENDENT_AMBULATORY_CARE_PROVIDER_SITE_OTHER): Payer: Medicare Other

## 2018-03-26 ENCOUNTER — Encounter: Payer: Self-pay | Admitting: Family Medicine

## 2018-03-26 DIAGNOSIS — R0989 Other specified symptoms and signs involving the circulatory and respiratory systems: Secondary | ICD-10-CM

## 2018-03-26 DIAGNOSIS — I6523 Occlusion and stenosis of bilateral carotid arteries: Secondary | ICD-10-CM | POA: Insufficient documentation

## 2018-03-26 NOTE — Progress Notes (Signed)
BP 122/64 (BP Location: Left Arm, Patient Position: Sitting, Cuff Size: Normal)   Pulse 64   Temp 97.7 F (36.5 C) (Oral)   Ht 5' 11.5" (1.816 m)   Wt 150 lb (68 kg)   SpO2 93%   BMI 20.63 kg/m    CC: 1 mo f/u visit Subjective:    Patient ID: Scott [name], male    DOB: 03-09-1940, 78 y.o.   MRN: [id number]  HPI: Scott [name] is a 78 y.o. male presenting on 03/27/2018 for Diabetes (Here for 1 mo f/u. )   CLL with ITP followed by heme. Latest Hgb dropped to 6.6. Continues NPlate for platelets. Denies dyspnea or lightheadedness.   Recent carotid US yesterday, preliminary report up showing 1-39% stenosis R and 40-59% stenosis L. rec rpt 1 yr.   Notes some posterior neck pain with prolonged standing, better when he sits down.   Hypothyroidism - levothyroxine increased to 69mcg last visit, tolerating well.   HLD - on lipitor - niacin stopped last month.   Chronically brittle diabetes - does regularly check sugars and brings log - fasting 80-120s, postprandial 140-1200s. Largely better controlled now. Compliant with antihyperglycemic regimen which includes: levemir 25u nightly, novolog 5u with meals (10 units if cbg >250). Rare 10u. Normally eats breakfast and lunch, skips dinner. CGM - hasn't had a chance to call insurance. Denies low sugars or hypoglycemic symptoms. Denies paresthesias. Last diabetic eye exam 03/2018. Pneumovax: 2013. Prevnar: 2015. Glucometer brand: one-touch. DSME: had diabetes refresher course with nutritionist 03/2018.  Lab Results  Component Value Date   HGBA1C 9.5 (H) 02/20/2018   Diabetic Foot Exam - Simple   No data filed     Lab Results  Component Value Date   MICROALBUR 2.3 (H) 02/01/2015    HTN - brings log which was also reviewed. 120-150/40s-60s, HR 50-80s.  Continued trouble with insomnia despite trazodone 50mg  nightly.      Relevant past medical, surgical, family and social history reviewed and updated as indicated. Interim medical history  since our last visit reviewed. Allergies and medications reviewed and updated. Outpatient Medications Prior to Visit  Medication Sig Dispense Refill  . acetaminophen (TYLENOL) 650 MG CR tablet Take 650 mg by mouth every 8 (eight) hours as needed for pain.    Marland Kitchen aspirin EC 81 MG tablet Take 1 tablet (81 mg total) by mouth daily. 90 tablet 3  . atorvastatin (LIPITOR) 80 MG tablet Take 1 tablet (80 mg total) by mouth daily. 90 tablet 3  . carvedilol (COREG) 6.25 MG tablet TAKE 1 TABLET BY MOUTH TWICE DAILY 180 tablet 3  . glucose blood (ONE TOUCH ULTRA TEST) test strip 1 each by Other route 4 (four) times daily. and as needed to check sugars Dx code:  E11.21 400 each 3  . insulin aspart (NOVOLOG) 100 UNIT/ML FlexPen Take 5 units with meals, take 10 units if mealtime sugar >250 15 mL 3  . Insulin Detemir (LEVEMIR FLEXTOUCH) 100 UNIT/ML Pen INJECT 25 UNITS SUBCUTANEOUSLY ONCE DAILY AT  10PM 15 pen 3  . levothyroxine (SYNTHROID, LEVOTHROID) 75 MCG tablet Take 1 tablet (75 mcg total) by mouth daily. 90 tablet 1  . nitroGLYCERIN (NITROSTAT) 0.4 MG SL tablet Place 1 tablet (0.4 mg total) under the tongue every 5 (five) minutes as needed for chest pain ((MAX of 3 doses)). 25 tablet 0  . traZODone (DESYREL) 50 MG tablet Take 1.5 tablets (75 mg total) by mouth at bedtime as needed for sleep.    Marland Kitchen  traZODone (DESYREL) 50 MG tablet Take 0.5-1 tablets (25-50 mg total) by mouth at bedtime as needed for sleep. 90 tablet 1  . furosemide (LASIX) 20 MG tablet Take 1 tablet (20 mg total) by mouth daily. 30 tablet 4  . Continuous Blood Gluc Receiver (FREESTYLE LIBRE 14 DAY READER) DEVI 1 Device by Does not apply route as directed. Patient gives insulin and checks sugars 4 times a day. E11.21 (Patient not taking: Reported on 03/19/2018) 1 Device 0  . Continuous Blood Gluc Sensor (FREESTYLE LIBRE 14 DAY SENSOR) MISC 1 Device by Does not apply route as directed. Patient gives insulin and checks sugars 4 times a day. E11.21  (Patient not taking: Reported on 03/19/2018) 6 each 3  . pantoprazole (PROTONIX) 40 MG tablet Take 1 tablet (40 mg total) by mouth 2 (two) times daily before a meal. 180 tablet 0   No facility-administered medications prior to visit.      Per HPI unless specifically indicated in ROS section below Review of Systems Objective:    BP 122/64 (BP Location: Left Arm, Patient Position: Sitting, Cuff Size: Normal)   Pulse 64   Temp 97.7 F (36.5 C) (Oral)   Ht 5' 11.5" (1.816 m)   Wt 150 lb (68 kg)   SpO2 93%   BMI 20.63 kg/m   Wt Readings from Last 3 Encounters:  03/27/18 150 lb (68 kg)  03/19/18 148 lb 3.2 oz (67.2 kg)  03/02/18 147 lb 14.4 oz (67.1 kg)    Physical Exam Vitals signs and nursing note reviewed.  Constitutional:      General: He is not in acute distress.    Appearance: He is well-developed.  HENT:     Head: Normocephalic and atraumatic.     Right Ear: External ear normal.     Left Ear: External ear normal.     Nose: Nose normal.     Mouth/Throat:     Pharynx: No oropharyngeal exudate.  Eyes:     General: No scleral icterus.    Conjunctiva/sclera: Conjunctivae normal.     Pupils: Pupils are equal, round, and reactive to light.  Neck:     Musculoskeletal: Normal range of motion and neck supple.  Cardiovascular:     Rate and Rhythm: Normal rate and regular rhythm.     Heart sounds: Normal heart sounds. No murmur.  Pulmonary:     Effort: Pulmonary effort is normal. No respiratory distress.     Breath sounds: Normal breath sounds. No wheezing or rales.  Musculoskeletal: Normal range of motion.     Comments: See HPI for foot exam if done No midline cervical neck pain to palpation, some swelling noted lower cervical spine L of midline. No paracervical or trap mm tenderness  Lymphadenopathy:     Cervical: No cervical adenopathy.  Skin:    General: Skin is warm and dry.     Findings: No rash.       Results for orders placed or performed in visit on 03/16/18    CBC with Differential/Platelet  Result Value Ref Range   WBC 7.4 4.0 - 10.5 K/uL   RBC 2.02 (L) 4.22 - 5.81 MIL/uL   Hemoglobin 6.6 (L) 13.0 - 17.0 g/dL   HCT 20.6 (L) 39.0 - 52.0 %   MCV 102.0 (H) 80.0 - 100.0 fL   MCH 32.7 26.0 - 34.0 pg   MCHC 32.0 30.0 - 36.0 g/dL   RDW 14.7 11.5 - 15.5 %   Platelets 50 (L) 150 -  400 K/uL   nRBC 0.0 0.0 - 0.2 %   Neutrophils Relative % 36 %   Neutro Abs 2.7 1.7 - 7.7 K/uL   Lymphocytes Relative 57 %   Lymphs Abs 4.1 (H) 0.7 - 4.0 K/uL   Monocytes Relative 6 %   Monocytes Absolute 0.5 0.1 - 1.0 K/uL   Eosinophils Relative 1 %   Eosinophils Absolute 0.1 0.0 - 0.5 K/uL   Basophils Relative 0 %   Basophils Absolute 0.0 0.0 - 0.1 K/uL   Immature Granulocytes 0 %   Abs Immature Granulocytes 0.03 0.00 - 0.07 K/uL  Hold Tube- Blood Bank  Result Value Ref Range   Blood Bank Specimen SAMPLE AVAILABLE FOR TESTING    Sample Expiration      03/19/2018 Performed at [facility], [address] [place], [place] [address]    Lab Results  Component Value Date   CREATININE 1.62 (H) 02/27/2018   BUN 38 (H) 02/27/2018   NA 144 02/27/2018   K 5.0 02/27/2018   CL 109 02/27/2018   CO2 25 02/27/2018    Assessment & Plan:   Problem List Items Addressed This Visit    Uncontrolled type 2 diabetes mellitus with nephropathy ([place]) - Primary    Significant improvement in cbg control off niacin and after attending diabetes refresher course. No changes today, will recheck A1c next visit.       Relevant Orders   Microalbumin / creatinine urine ratio   Neck pain    Ongoing, worse with prolonged standing, better with sitting/supine. Check films today.       Relevant Orders   DG Cervical Spine Complete   Hypothyroidism    Now on levothyroxine 57mcg daily. Will reassess next visit.       Hypertension    Chronic, stable off lisinopril - will stay off at this time.       HLD (hyperlipidemia)    Now off niacin with improvement in glycemic  control. Check FLP next visit.       CLL (chronic lymphocytic leukemia) (HCC)   CKD (chronic kidney disease) stage 3, GFR 30-59 ml/min (HCC)   Chronic insomnia    Increase trazodone to 75mg  nightly, consider remeron if ineffective      Carotid stenosis, asymptomatic, bilateral    Reviewed recent carotid US with patient.       Anemia in CKD (chronic kidney disease)    Progressively worse - latest Hgb 6.6. upcoming appt with heme on Monday, will have labs rechecked then.           No orders of the defined types were placed in this encounter.  Orders Placed This Encounter  Procedures  . DG Cervical Spine Complete    Standing Status:   Future    Number of Occurrences:   1    Standing Expiration Date:   05/26/2019    Order Specific Question:   Reason for Exam (SYMPTOM  OR DIAGNOSIS REQUIRED)    Answer:   midline cervical neck pain    Order Specific Question:   Preferred imaging location?    Answer:   [facility]    Order Specific Question:   Radiology Contrast Protocol - do NOT remove file path    Answer:   [email or web address]  . Microalbumin / creatinine urine ratio    Follow up plan: Return in about 3 months (around 06/26/2018) for follow up visit.  [name], MD

## 2018-03-27 ENCOUNTER — Encounter: Payer: Self-pay | Admitting: Family Medicine

## 2018-03-27 ENCOUNTER — Ambulatory Visit (INDEPENDENT_AMBULATORY_CARE_PROVIDER_SITE_OTHER): Payer: Medicare Other | Admitting: Family Medicine

## 2018-03-27 ENCOUNTER — Ambulatory Visit (INDEPENDENT_AMBULATORY_CARE_PROVIDER_SITE_OTHER)
Admission: RE | Admit: 2018-03-27 | Discharge: 2018-03-27 | Disposition: A | Discharge status: Home / Self Care | Payer: Medicare Other | Source: Ambulatory Visit | Attending: Family Medicine | Admitting: Family Medicine

## 2018-03-27 VITALS — BP 122/64 | HR 64 | Temp 97.7°F | Ht 71.5 in | Wt 150.0 lb

## 2018-03-27 DIAGNOSIS — C9110 Chronic lymphocytic leukemia of B-cell type not having achieved remission: Secondary | ICD-10-CM | POA: Diagnosis not present

## 2018-03-27 DIAGNOSIS — I10 Essential (primary) hypertension: Secondary | ICD-10-CM

## 2018-03-27 DIAGNOSIS — E039 Hypothyroidism, unspecified: Secondary | ICD-10-CM | POA: Diagnosis not present

## 2018-03-27 DIAGNOSIS — F5104 Psychophysiologic insomnia: Secondary | ICD-10-CM | POA: Diagnosis not present

## 2018-03-27 DIAGNOSIS — N183 Chronic kidney disease, stage 3 unspecified: Secondary | ICD-10-CM

## 2018-03-27 DIAGNOSIS — E1165 Type 2 diabetes mellitus with hyperglycemia: Secondary | ICD-10-CM

## 2018-03-27 DIAGNOSIS — E7849 Other hyperlipidemia: Secondary | ICD-10-CM

## 2018-03-27 DIAGNOSIS — M542 Cervicalgia: Secondary | ICD-10-CM

## 2018-03-27 DIAGNOSIS — D631 Anemia in chronic kidney disease: Secondary | ICD-10-CM | POA: Diagnosis not present

## 2018-03-27 DIAGNOSIS — M47812 Spondylosis without myelopathy or radiculopathy, cervical region: Secondary | ICD-10-CM | POA: Insufficient documentation

## 2018-03-27 DIAGNOSIS — I6523 Occlusion and stenosis of bilateral carotid arteries: Secondary | ICD-10-CM | POA: Diagnosis not present

## 2018-03-27 DIAGNOSIS — E1121 Type 2 diabetes mellitus with diabetic nephropathy: Secondary | ICD-10-CM

## 2018-03-27 DIAGNOSIS — IMO0002 Reserved for concepts with insufficient information to code with codable children: Secondary | ICD-10-CM

## 2018-03-27 LAB — MICROALBUMIN / CREATININE URINE RATIO
Creatinine,U: 67.4 mg/dL
Microalb Creat Ratio: 8.4 mg/g (ref 0.0–30.0)
Microalb, Ur: 5.6 mg/dL — ABNORMAL HIGH (ref 0.0–1.9)

## 2018-03-27 NOTE — Assessment & Plan Note (Signed)
Ongoing, worse with prolonged standing, better with sitting/supine. Check films today.

## 2018-03-27 NOTE — Assessment & Plan Note (Signed)
Reviewed recent carotid US with patient.

## 2018-03-27 NOTE — Assessment & Plan Note (Signed)
Significant improvement in cbg control off niacin and after attending diabetes refresher course. No changes today, will recheck A1c next visit.

## 2018-03-27 NOTE — Assessment & Plan Note (Signed)
Now off niacin with improvement in glycemic control. Check FLP next visit.

## 2018-03-27 NOTE — Assessment & Plan Note (Signed)
Progressively worse - latest Hgb 6.6. upcoming appt with heme on Monday, will have labs rechecked then.

## 2018-03-27 NOTE — Patient Instructions (Addendum)
Sugars are doing better! Keep up the good work. Call and ask insurance about coverage for continuous glucose monitor. You should qualify - as you've been checking sugars 4 times a day.  Let's get neck xray for neck pain

## 2018-03-27 NOTE — Assessment & Plan Note (Signed)
Increase trazodone to 75mg  nightly, consider remeron if ineffective

## 2018-03-27 NOTE — Assessment & Plan Note (Addendum)
Now on levothyroxine 54mcg daily. Will reassess next visit.

## 2018-03-27 NOTE — Assessment & Plan Note (Signed)
Chronic, stable off lisinopril - will stay off at this time.

## 2018-03-28 ENCOUNTER — Encounter: Payer: Self-pay | Admitting: Family Medicine

## 2018-03-29 NOTE — Progress Notes (Signed)
[place]  Telephone:(336) [phone] Fax:(336) [phone]  ID: Scott [name] OB: Oct 04, 1940  MR#: [id number]  [id number]#:[id number]  Patient Care Team: [name], MD as PCP - General (Family Medicine) Lloyd Huger, MD as Consulting Physician (Oncology) Clent Jacks, MD as Consulting Physician (Ophthalmology) Dorothy Spark, MD as Consulting Physician (Cardiology) Madelon Lips, MD as Consulting Physician (Nephrology)  CHIEF COMPLAINT: 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  INTERVAL HISTORY: Patient returns to clinic today for repeat laboratory work, further evaluation, and continuation of Nplate.  He is noted increased weakness and fatigue as well as occasional dizziness. He denies any easy bleeding or bruising.  He has no other neurologic complaints.  He has a good appetite and denies weight loss. He denies any night sweats. He has noted no lymphadenopathy. He has no chest pain or shortness of breath. He denies any nausea, vomiting, constipation, or diarrhea. He has no urinary complaints.  Patient offers no further specific complaints today.  REVIEW OF SYSTEMS:   Review of Systems  Constitutional: Positive for malaise/fatigue. Negative for fever and weight loss.  Respiratory: Negative.  Negative for cough and shortness of breath.   Cardiovascular: Negative.  Negative for chest pain and leg swelling.  Gastrointestinal: Negative.  Negative for abdominal pain and blood in stool.  Genitourinary: Negative.  Negative for dysuria and hematuria.  Musculoskeletal: Negative.  Negative for back pain and neck pain.  Skin: Negative.  Negative for itching and rash.  Neurological: Positive for dizziness and weakness. Negative for sensory change, focal weakness and headaches.  Endo/Heme/Allergies: Does not bruise/bleed easily.  Psychiatric/Behavioral: Negative.  The patient is not nervous/anxious.     As per HPI. Otherwise, a complete review of systems is  negative.  PAST MEDICAL HISTORY: Past Medical History:  Diagnosis Date  . Acquired hand deformity 1962   hand saw accident at work  . Anemia 10/11/2011  . Arthritis   . Bradycardia 10/10/2011  . CAD (coronary artery disease) 10/10/2011   MI s/p PTCA (Dx-OM2 proximal concentric stenosis)  . CKD (chronic kidney disease) stage 3, GFR 30-59 ml/min (HCC)    [name]  . CLL (chronic lymphocytic leukemia) ([place])   . Colon polyps   . Generalized headaches    frequent  . GI bleed 07/08/2017  . Glaucoma    s/p laser surgery  . HLD (hyperlipidemia)   . Hypertension   . Hypothyroidism 10/10/2011  . Thrombocytopenia ([place]) 10/11/2011  . Type 2 diabetes with nephropathy [facility])    DM refresher course [facility] (04/2013)    PAST SURGICAL HISTORY: Past Surgical History:  Procedure Laterality Date  . BACK SURGERY     cervical neck  . CATARACT EXTRACTION, BILATERAL Bilateral 2017  . COLONOSCOPY  11/2008   1 polyp, diverticulosis, rec rpt 5 yrs (Dr. Oletta Lamas, [name])  . COLONOSCOPY  06/2014   hyperplastic polyp, rpt 5 yrs ([place])  . COLONOSCOPY WITH PROPOFOL N/A 11/17/2017   TA, HP, (Vanga, Tally Due, MD)  . ESOPHAGOGASTRODUODENOSCOPY (EGD) WITH PROPOFOL N/A 11/17/2017   healing erosive gastritis, intestinal metaplasia, neg H pylori (Vanga, Tally Due, MD)  . EYE SURGERY  2012   laser surgery for glaucoma  . PTCA  1994, 1995  . US ECHOCARDIOGRAPHY  10/2013   inferior wall hypokinesis, mild LVH, EF 50-55%, mild MR and LA dilation    FAMILY HISTORY: Family History  Problem Relation Age of Onset  . Diabetes Sister   . Stomach cancer Sister 68  . Pneumonia Father  caused death  . Other Brother        no communication with brother so unsure of any health conditions  . Coronary artery disease Son 59       5v CABG and stents  . Hyperlipidemia Sister   . Stroke Neg Hx   . Heart attack Neg Hx     ADVANCED DIRECTIVES (Y/N):  N  HEALTH MAINTENANCE: Social History   Tobacco Use  . Smoking  status: Former Smoker    Last attempt to quit: 03/04/1978    Years since quitting: 40.1  . Smokeless tobacco: Never Used  Substance Use Topics  . Alcohol use: No  . Drug use: No     Colonoscopy:  PAP:  Bone density:  Lipid panel:  No Known Allergies  Current Outpatient Medications  Medication Sig Dispense Refill  . acetaminophen (TYLENOL) 650 MG CR tablet Take 650 mg by mouth every 8 (eight) hours as needed for pain.    Marland Kitchen aspirin EC 81 MG tablet Take 1 tablet (81 mg total) by mouth daily. 90 tablet 3  . carvedilol (COREG) 6.25 MG tablet TAKE 1 TABLET BY MOUTH TWICE DAILY 180 tablet 3  . glucose blood (ONE TOUCH ULTRA TEST) test strip 1 each by Other route 4 (four) times daily. and as needed to check sugars Dx code:  E11.21 400 each 3  . insulin aspart (NOVOLOG) 100 UNIT/ML FlexPen Take 5 units with meals, take 10 units if mealtime sugar >250 15 mL 3  . levothyroxine (SYNTHROID, LEVOTHROID) 75 MCG tablet Take 1 tablet (75 mcg total) by mouth daily. 90 tablet 1  . nitroGLYCERIN (NITROSTAT) 0.4 MG SL tablet Place 1 tablet (0.4 mg total) under the tongue every 5 (five) minutes as needed for chest pain ((MAX of 3 doses)). 25 tablet 0  . traZODone (DESYREL) 50 MG tablet Take 1.5 tablets (75 mg total) by mouth at bedtime as needed for sleep.    . furosemide (LASIX) 20 MG tablet Take 1 tablet (20 mg total) by mouth daily. (Patient not taking: Reported on 03/30/2018) 30 tablet 4  . Insulin Detemir (LEVEMIR FLEXTOUCH) 100 UNIT/ML Pen INJECT 25 UNITS SUBCUTANEOUSLY ONCE DAILY AT  10PM 15 pen 3   No current facility-administered medications for this visit.     OBJECTIVE: Vitals:   03/30/18 1007  BP: (!) 110/58  Pulse: 61  Temp: (!) 97.5 F (36.4 C)     Body mass index is 20.19 kg/m.    ECOG FS:0 - Asymptomatic  General: Well-developed, well-nourished, no acute distress. Eyes: Pink conjunctiva, anicteric sclera. HEENT: Normocephalic, moist mucous membranes. Lungs: Clear to auscultation  bilaterally. Heart: Regular rate and rhythm. No rubs, murmurs, or gallops. Abdomen: Soft, nontender, nondistended. No organomegaly noted, normoactive bowel sounds. Musculoskeletal: No edema, cyanosis, or clubbing. Neuro: Alert, answering all questions appropriately. Cranial nerves grossly intact. Skin: No rashes or petechiae noted. Psych: Normal affect.  LAB RESULTS:  Lab Results  Component Value Date   NA 144 02/27/2018   K 5.0 02/27/2018   CL 109 02/27/2018   CO2 25 02/27/2018   GLUCOSE 145 (H) 02/27/2018   BUN 38 (H) 02/27/2018   CREATININE 1.62 (H) 02/27/2018   CALCIUM 9.9 02/27/2018   PROT 6.8 02/20/2018   ALBUMIN 4.2 02/27/2018   AST 23 02/20/2018   ALT 13 02/20/2018   ALKPHOS 61 02/20/2018   BILITOT 1.1 02/20/2018   GFRNONAA 36 (L) 08/23/2017   GFRAA 42 (L) 08/23/2017    Lab Results  Component Value  Date   WBC 7.6 03/30/2018   NEUTROABS 2.1 03/30/2018   HGB 6.3 (L) 03/30/2018   HCT 20.7 (L) 03/30/2018   MCV 103.5 (H) 03/30/2018   PLT 78 (L) 03/30/2018     STUDIES: Dg Cervical Spine Complete  Result Date: 03/27/2018 CLINICAL DATA:  Neck pain EXAM: CERVICAL SPINE - COMPLETE 4+ VIEW COMPARISON:  Cervical spine CT 08/23/2017 FINDINGS: 2 mm anterolisthesis C4-5.  Remaining alignment normal. Negative for fracture or mass lesion. Prevertebral soft tissues normal. Multilevel disc and facet degeneration throughout the cervical spine. Mild right foraminal narrowing C3-4, C4-5, C5-6, C6-7. Moderate left foraminal narrowing C5-6 and mild left foraminal narrowing C6-7 IMPRESSION: Cervical spondylosis multilevel.  No acute bony abnormality. Electronically Signed   By: Franchot Gallo M.D.   On: 03/27/2018 10:28   Vas US Carotid  Result Date: 03/26/2018 Carotid Arterial Duplex Study Indications:   Left carotid bruit. Risk Factors:  Hypertension, hyperlipidemia, Diabetes, past history of smoking,                coronary artery disease. Other Factors: Pt c/o "wobbliness" which  has been attributed to anemia. Performing Technologist: Pilar Jarvis RDMS, RVT, RDCS  Examination Guidelines: A complete evaluation includes B-mode imaging, spectral Doppler, color Doppler, and power Doppler as needed of all accessible portions of each vessel. Bilateral testing is considered an integral part of a complete examination. Limited examinations for reoccurring indications may be performed as noted.  Right Carotid Findings: +----------+--------+--------+--------+-----------------------------+--------+           PSV cm/sEDV cm/sStenosisDescribe                     Comments +----------+--------+--------+--------+-----------------------------+--------+ CCA Prox  87      17                                                    +----------+--------+--------+--------+-----------------------------+--------+ CCA Distal109     27                                                    +----------+--------+--------+--------+-----------------------------+--------+ ICA Prox  91      35      1-39%   focal, calcific and irregular         +----------+--------+--------+--------+-----------------------------+--------+ ICA Mid   102     29      1-39%                                         +----------+--------+--------+--------+-----------------------------+--------+ ICA Distal98      27                                                    +----------+--------+--------+--------+-----------------------------+--------+ ECA       70      12                                                    +----------+--------+--------+--------+-----------------------------+--------+ +----------+--------+-------+----------------+-------------------+  PSV cm/sEDV cmsDescribe        Arm Pressure (mmHG) +----------+--------+-------+----------------+-------------------+ [id number]            Multiphasic, [id number]                  +----------+--------+-------+----------------+-------------------+ +---------+--------+---+--------+--+---------+ VertebralPSV cm/s118EDV cm/s22Antegrade +---------+--------+---+--------+--+---------+  Left Carotid Findings: +----------+-------+--------+--------+--------------------------------+--------+           PSV    EDV cm/sStenosisDescribe                        Comments           cm/s                                                            +----------+-------+--------+--------+--------------------------------+--------+ CCA Prox  92     18                                                       +----------+-------+--------+--------+--------------------------------+--------+ CCA Distal96     26              homogeneous and smooth                   +----------+-------+--------+--------+--------------------------------+--------+ ICA Prox  199    56      40-59%  diffuse, hyperechoic and                                                  irregular                                +----------+-------+--------+--------+--------------------------------+--------+ ICA Mid   145    46      40-59%  calcific                                 +----------+-------+--------+--------+--------------------------------+--------+ ICA Distal106    30                                                       +----------+-------+--------+--------+--------------------------------+--------+ ECA       100    15                                                       +----------+-------+--------+--------+--------------------------------+--------+ +----------+--------+--------+----------------+-------------------+ SubclavianPSV cm/sEDV cm/sDescribe        Arm Pressure (mmHG) +----------+--------+--------+----------------+-------------------+           62      7       Multiphasic, [id number]                 +----------+--------+--------+----------------+-------------------+  +---------+--------+--+--------+--+---------+  VertebralPSV cm/s66EDV cm/s23Antegrade +---------+--------+--+--------+--+---------+  Summary: Right Carotid: Velocities in the right ICA are consistent with a 1-39% stenosis.                Non-hemodynamically significant plaque <50% noted in the CCA. The                ECA appears <50% stenosed. Left Carotid: Velocities in the left ICA are consistent with a 40-59% stenosis.               Non-hemodynamically significant plaque noted in the CCA. The ECA               appears <50% stenosed. Vertebrals:  Bilateral vertebral arteries demonstrate antegrade flow. Subclavians: Normal flow hemodynamics were seen in bilateral subclavian              arteries. *See table(s) above for measurements and observations. Suggest follow up study in 12 months. Electronically signed by Ida Rogue MD on 03/26/2018 at 9:22:13 PM.    Final     ASSESSMENT: CLL with ATM (11q-) mutation and 13q-, anemia, ITP.  PLAN:    1. CLL: Bone marrow biopsy completed on October 23, 2017 reviewed independently with involvement with CLL, but unclear percentage.  Although patient was reported to have normal cytogenetics in his bone marrow sample, he was noted to have a 50% incidence of mutation in the ATM gene which is commonly associated with deletion 11q. 11q- is associated with an unfavorable prognosis and high risk of not responding to initial treatment. 13q-is a more common mutation and is actually associated with a more favorable prognosis. Patient's white blood cell count continues to be within normal limits.  He received his last infusion of single agent Rituxan on September 03, 2017.  No further intervention is needed.  If patient requires additional treatment could possibly transition him to [place].   2.  Thrombocytopenia: Patient's platelet count continues to improve and is now 78.  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  Patient has received high-dose prednisone, IVIG, and Rituxan  with no appreciable durability to improve his platelet count.  Proceed with Nplate injection today.  Return to clinic in 4 weeks for repeat laboratory work, further evaluation, and continuation of treatment of his platelet count remains below 100.   3.  Anemia: Patient's hemoglobin has trended down significantly and is now 6.2.  Patient had colonoscopy on November 17, 2017 that removed 2 nonmalignant polyps.  EGD on the same day revealed nonbleeding erosive gastropathy with multiple nonbleeding duodenal ulcers.  Return to clinic on Thursday for 2 units of packed red blood cells. 4.  Reaction to Rituxan: Rate-based.  Patient will require premedications for any further infusions with Rituxan.    I spent a total of 30 minutes face-to-face with the patient of which greater than 50% of the visit was spent in counseling and coordination of care as detailed above.  Patient expressed understanding and was in agreement with this plan. He also understands that He can call clinic at any time with any questions, concerns, or complaints.    Lloyd Huger, MD   03/31/2018 6:40 AM

## 2018-03-30 ENCOUNTER — Inpatient Hospital Stay: Payer: Medicare Other

## 2018-03-30 ENCOUNTER — Other Ambulatory Visit: Payer: Self-pay

## 2018-03-30 ENCOUNTER — Inpatient Hospital Stay (HOSPITAL_BASED_OUTPATIENT_CLINIC_OR_DEPARTMENT_OTHER): Payer: Medicare Other | Admitting: Oncology

## 2018-03-30 VITALS — BP 110/58 | HR 61 | Temp 97.5°F | Ht 71.5 in | Wt 146.8 lb

## 2018-03-30 DIAGNOSIS — D696 Thrombocytopenia, unspecified: Secondary | ICD-10-CM | POA: Diagnosis not present

## 2018-03-30 DIAGNOSIS — D693 Immune thrombocytopenic purpura: Secondary | ICD-10-CM | POA: Diagnosis not present

## 2018-03-30 DIAGNOSIS — D649 Anemia, unspecified: Secondary | ICD-10-CM

## 2018-03-30 DIAGNOSIS — C9110 Chronic lymphocytic leukemia of B-cell type not having achieved remission: Secondary | ICD-10-CM | POA: Diagnosis not present

## 2018-03-30 DIAGNOSIS — E039 Hypothyroidism, unspecified: Secondary | ICD-10-CM

## 2018-03-30 DIAGNOSIS — E1122 Type 2 diabetes mellitus with diabetic chronic kidney disease: Secondary | ICD-10-CM

## 2018-03-30 DIAGNOSIS — I129 Hypertensive chronic kidney disease with stage 1 through stage 4 chronic kidney disease, or unspecified chronic kidney disease: Secondary | ICD-10-CM

## 2018-03-30 DIAGNOSIS — Z79899 Other long term (current) drug therapy: Secondary | ICD-10-CM | POA: Diagnosis not present

## 2018-03-30 DIAGNOSIS — N183 Chronic kidney disease, stage 3 (moderate): Secondary | ICD-10-CM

## 2018-03-30 DIAGNOSIS — Z87891 Personal history of nicotine dependence: Secondary | ICD-10-CM | POA: Diagnosis not present

## 2018-03-30 DIAGNOSIS — I2510 Atherosclerotic heart disease of native coronary artery without angina pectoris: Secondary | ICD-10-CM

## 2018-03-30 LAB — CBC WITH DIFFERENTIAL/PLATELET
Abs Immature Granulocytes: 0.07 10*3/uL (ref 0.00–0.07)
Basophils Absolute: 0 10*3/uL (ref 0.0–0.1)
Basophils Relative: 0 %
Eosinophils Absolute: 0.1 10*3/uL (ref 0.0–0.5)
Eosinophils Relative: 2 %
HCT: 20.7 % — ABNORMAL LOW (ref 39.0–52.0)
HEMOGLOBIN: 6.3 g/dL — AB (ref 13.0–17.0)
Immature Granulocytes: 1 %
Lymphocytes Relative: 60 %
Lymphs Abs: 4.6 10*3/uL — ABNORMAL HIGH (ref 0.7–4.0)
MCH: 31.5 pg (ref 26.0–34.0)
MCHC: 30.4 g/dL (ref 30.0–36.0)
MCV: 103.5 fL — ABNORMAL HIGH (ref 80.0–100.0)
Monocytes Absolute: 0.7 10*3/uL (ref 0.1–1.0)
Monocytes Relative: 9 %
NEUTROS ABS: 2.1 10*3/uL (ref 1.7–7.7)
Neutrophils Relative %: 28 %
Platelets: 78 10*3/uL — ABNORMAL LOW (ref 150–400)
RBC: 2 MIL/uL — AB (ref 4.22–5.81)
RDW: 16.5 % — ABNORMAL HIGH (ref 11.5–15.5)
WBC: 7.6 10*3/uL (ref 4.0–10.5)
nRBC: 0 % (ref 0.0–0.2)

## 2018-03-30 LAB — SAMPLE TO BLOOD BANK

## 2018-03-30 LAB — PREPARE RBC (CROSSMATCH)

## 2018-03-30 MED ORDER — ROMIPLOSTIM 250 MCG ~~LOC~~ SOLR
2.0000 ug/kg | Freq: Once | SUBCUTANEOUS | Status: AC
  Administered 2018-03-30: 135 ug via SUBCUTANEOUS
  Filled 2018-03-30: qty 0.27

## 2018-03-30 NOTE — Progress Notes (Signed)
Patient is here today to follow up on his anemia. Patient stated that he is not "feeling well, funny". Patient denied poor appetite, fever, chills, nausea, vomiting, fatigued, tiredness. Patient is not able to explain his current symptoms.

## 2018-04-01 DIAGNOSIS — D693 Immune thrombocytopenic purpura: Secondary | ICD-10-CM | POA: Diagnosis not present

## 2018-04-01 DIAGNOSIS — C9190 Lymphoid leukemia, unspecified not having achieved remission: Secondary | ICD-10-CM | POA: Diagnosis not present

## 2018-04-01 DIAGNOSIS — E1122 Type 2 diabetes mellitus with diabetic chronic kidney disease: Secondary | ICD-10-CM | POA: Diagnosis not present

## 2018-04-01 DIAGNOSIS — D649 Anemia, unspecified: Secondary | ICD-10-CM | POA: Diagnosis not present

## 2018-04-01 DIAGNOSIS — N183 Chronic kidney disease, stage 3 (moderate): Secondary | ICD-10-CM | POA: Diagnosis not present

## 2018-04-01 DIAGNOSIS — I129 Hypertensive chronic kidney disease with stage 1 through stage 4 chronic kidney disease, or unspecified chronic kidney disease: Secondary | ICD-10-CM | POA: Diagnosis not present

## 2018-04-02 ENCOUNTER — Inpatient Hospital Stay: Payer: Medicare Other

## 2018-04-02 DIAGNOSIS — Z79899 Other long term (current) drug therapy: Secondary | ICD-10-CM | POA: Diagnosis not present

## 2018-04-02 DIAGNOSIS — C9110 Chronic lymphocytic leukemia of B-cell type not having achieved remission: Secondary | ICD-10-CM

## 2018-04-02 DIAGNOSIS — N183 Chronic kidney disease, stage 3 (moderate): Secondary | ICD-10-CM | POA: Diagnosis not present

## 2018-04-02 DIAGNOSIS — I129 Hypertensive chronic kidney disease with stage 1 through stage 4 chronic kidney disease, or unspecified chronic kidney disease: Secondary | ICD-10-CM | POA: Diagnosis not present

## 2018-04-02 DIAGNOSIS — D693 Immune thrombocytopenic purpura: Secondary | ICD-10-CM | POA: Diagnosis not present

## 2018-04-02 DIAGNOSIS — Z87891 Personal history of nicotine dependence: Secondary | ICD-10-CM | POA: Diagnosis not present

## 2018-04-02 MED ORDER — SODIUM CHLORIDE 0.9% IV SOLUTION
250.0000 mL | Freq: Once | INTRAVENOUS | Status: AC
  Administered 2018-04-02: 250 mL via INTRAVENOUS
  Filled 2018-04-02: qty 250

## 2018-04-02 MED ORDER — DIPHENHYDRAMINE HCL 50 MG/ML IJ SOLN: 25.0000 mg | Freq: Once | INTRAMUSCULAR | Status: DC

## 2018-04-02 MED ORDER — ACETAMINOPHEN 325 MG PO TABS
650.0000 mg | ORAL_TABLET | Freq: Once | ORAL | Status: AC
  Administered 2018-04-02: 650 mg via ORAL
  Filled 2018-04-02: qty 2

## 2018-04-02 MED ORDER — DIPHENHYDRAMINE HCL 25 MG PO CAPS
25.0000 mg | ORAL_CAPSULE | Freq: Once | ORAL | Status: AC
  Administered 2018-04-02: 25 mg via ORAL
  Filled 2018-04-02: qty 1

## 2018-04-03 LAB — BPAM RBC
BLOOD PRODUCT EXPIRATION DATE: 202002072359
Blood Product Expiration Date: 202002072359
ISSUE DATE / TIME: 202001300940
ISSUE DATE / TIME: 202001301142
Unit Type and Rh: 600
Unit Type and Rh: 600

## 2018-04-03 LAB — TYPE AND SCREEN
ABO/RH(D): A POS
Antibody Screen: POSITIVE
DAT, COMPLEMENT: POSITIVE
DAT, IgG: POSITIVE
DONOR AG TYPE: NEGATIVE
Unit division: 0
Unit division: 0

## 2018-04-26 NOTE — Progress Notes (Signed)
[name]  Telephone:(336) [phone] Fax:(336) [phone]  ID: [name] OB: May 16, 1940  MR#: [id number]  [id number]#:[id number]  Patient Care Team: [name], MD as PCP - General (Family Medicine) Lloyd Huger, MD as Consulting Physician (Oncology) Clent Jacks, MD as Consulting Physician (Ophthalmology) Dorothy Spark, MD as Consulting Physician (Cardiology) Madelon Lips, MD as Consulting Physician (Nephrology)  CHIEF COMPLAINT: 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  INTERVAL HISTORY: Patient returns to clinic today for repeat laboratory work, further evaluation, and continuation of Nplate as well as blood transfusion.  He continues to have chronic weakness and fatigue, but otherwise feels well.  He does not complain of any dizziness today.  He noted black tarry stools for about a week, but denies any other bleeding.  He continues to have easy bruising.  He has no neurologic complaints. He has a good appetite and denies weight loss. He denies any night sweats. He has noted no lymphadenopathy. He has no chest pain or shortness of breath. He denies any nausea, vomiting, constipation, or diarrhea. He has no urinary complaints.  Patient offers no further specific complaints today.  REVIEW OF SYSTEMS:   Review of Systems  Constitutional: Positive for malaise/fatigue. Negative for fever and weight loss.  Respiratory: Negative.  Negative for cough and shortness of breath.   Cardiovascular: Negative.  Negative for chest pain and leg swelling.  Gastrointestinal: Positive for melena. Negative for abdominal pain and blood in stool.  Genitourinary: Negative.  Negative for dysuria and hematuria.  Musculoskeletal: Negative.  Negative for back pain and neck pain.  Skin: Negative.  Negative for itching and rash.  Neurological: Positive for weakness. Negative for dizziness, sensory change, focal weakness and headaches.  Endo/Heme/Allergies: Bruises/bleeds  easily.  Psychiatric/Behavioral: Negative.  The patient is not nervous/anxious.     As per HPI. Otherwise, a complete review of systems is negative.  PAST MEDICAL HISTORY: Past Medical History:  Diagnosis Date  . Acquired hand deformity 1962   hand saw accident at work  . Anemia 10/11/2011  . Arthritis   . Bradycardia 10/10/2011  . CAD (coronary artery disease) 10/10/2011   MI s/p PTCA (Dx-OM2 proximal concentric stenosis)  . CKD (chronic kidney disease) stage 3, GFR 30-59 ml/min (HCC)    [name]  . CLL (chronic lymphocytic leukemia) ([place])   . Colon polyps   . Generalized headaches    frequent  . GI bleed 07/08/2017  . Glaucoma    s/p laser surgery  . HLD (hyperlipidemia)   . Hypertension   . Hypothyroidism 10/10/2011  . Thrombocytopenia ([place]) 10/11/2011  . Type 2 diabetes with nephropathy [facility])    DM refresher course [facility] (04/2013)    PAST SURGICAL HISTORY: Past Surgical History:  Procedure Laterality Date  . BACK SURGERY     cervical neck  . CATARACT EXTRACTION, BILATERAL Bilateral 2017  . COLONOSCOPY  11/2008   1 polyp, diverticulosis, rec rpt 5 yrs (Dr. Oletta Lamas, [name])  . COLONOSCOPY  06/2014   hyperplastic polyp, rpt 5 yrs ([place])  . COLONOSCOPY WITH PROPOFOL N/A 11/17/2017   TA, HP, (Vanga, Tally Due, MD)  . ESOPHAGOGASTRODUODENOSCOPY (EGD) WITH PROPOFOL N/A 11/17/2017   healing erosive gastritis, intestinal metaplasia, neg H pylori (Vanga, Tally Due, MD)  . EYE SURGERY  2012   laser surgery for glaucoma  . PTCA  1994, 1995  . US ECHOCARDIOGRAPHY  10/2013   inferior wall hypokinesis, mild LVH, EF 50-55%, mild MR and LA dilation    FAMILY  HISTORY: Family History  Problem Relation Age of Onset  . Diabetes Sister   . Stomach cancer Sister 48  . Pneumonia Father        caused death  . Other Brother        no communication with brother so unsure of any health conditions  . Coronary artery disease Son 58       5v CABG and stents  . Hyperlipidemia Sister   .  Stroke Neg Hx   . Heart attack Neg Hx     ADVANCED DIRECTIVES (Y/N):  N  HEALTH MAINTENANCE: Social History   Tobacco Use  . Smoking status: Former Smoker    Last attempt to quit: 03/04/1978    Years since quitting: 40.1  . Smokeless tobacco: Never Used  Substance Use Topics  . Alcohol use: No  . Drug use: No     Colonoscopy:  PAP:  Bone density:  Lipid panel:  No Known Allergies  Current Outpatient Medications  Medication Sig Dispense Refill  . acetaminophen (TYLENOL) 650 MG CR tablet Take 650 mg by mouth every 8 (eight) hours as needed for pain.    Marland Kitchen aspirin EC 81 MG tablet Take 1 tablet (81 mg total) by mouth daily. 90 tablet 3  . atorvastatin (LIPITOR) 80 MG tablet Take 80 mg by mouth daily.    . carvedilol (COREG) 6.25 MG tablet TAKE 1 TABLET BY MOUTH TWICE DAILY 180 tablet 3  . furosemide (LASIX) 20 MG tablet Take 1 tablet (20 mg total) by mouth daily. 30 tablet 4  . glucose blood (ONE TOUCH ULTRA TEST) test strip 1 each by Other route 4 (four) times daily. and as needed to check sugars Dx code:  E11.21 400 each 3  . insulin aspart (NOVOLOG) 100 UNIT/ML FlexPen Take 5 units with meals, take 10 units if mealtime sugar >250 15 mL 3  . Insulin Detemir (LEVEMIR FLEXTOUCH) 100 UNIT/ML Pen INJECT 25 UNITS SUBCUTANEOUSLY ONCE DAILY AT  10PM 15 pen 3  . levothyroxine (SYNTHROID, LEVOTHROID) 75 MCG tablet Take 1 tablet (75 mcg total) by mouth daily. 90 tablet 1  . nitroGLYCERIN (NITROSTAT) 0.4 MG SL tablet Place 1 tablet (0.4 mg total) under the tongue every 5 (five) minutes as needed for chest pain ((MAX of 3 doses)). 25 tablet 0  . traZODone (DESYREL) 50 MG tablet Take 1.5 tablets (75 mg total) by mouth at bedtime as needed for sleep.     No current facility-administered medications for this visit.     OBJECTIVE: Vitals:   04/27/18 1004  BP: 125/69  Pulse: 75  Temp: 98.1 F (36.7 C)     Body mass index is 20.96 kg/m.    ECOG FS:0 - Asymptomatic  General:  Well-developed, well-nourished, no acute distress. Eyes: Pink conjunctiva, anicteric sclera. HEENT: Normocephalic, moist mucous membranes. Lungs: Clear to auscultation bilaterally. Heart: Regular rate and rhythm. No rubs, murmurs, or gallops. Abdomen: Soft, nontender, nondistended. No organomegaly noted, normoactive bowel sounds. Musculoskeletal: No edema, cyanosis, or clubbing. Neuro: Alert, answering all questions appropriately. Cranial nerves grossly intact. Skin: No rashes or petechiae noted. Psych: Normal affect.  LAB RESULTS:  Lab Results  Component Value Date   NA 144 02/27/2018   K 5.0 02/27/2018   CL 109 02/27/2018   CO2 25 02/27/2018   GLUCOSE 145 (H) 02/27/2018   BUN 38 (H) 02/27/2018   CREATININE 1.62 (H) 02/27/2018   CALCIUM 9.9 02/27/2018   PROT 6.8 02/20/2018   ALBUMIN 4.2 02/27/2018  AST 23 02/20/2018   ALT 13 02/20/2018   ALKPHOS 61 02/20/2018   BILITOT 1.1 02/20/2018   GFRNONAA 36 (L) 08/23/2017   GFRAA 42 (L) 08/23/2017    Lab Results  Component Value Date   WBC 6.1 04/27/2018   NEUTROABS 2.3 04/27/2018   HGB 5.7 (L) 04/27/2018   HCT 18.1 (L) 04/27/2018   MCV 101.7 (H) 04/27/2018   PLT 41 (L) 04/27/2018     STUDIES: No results found.  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  PLAN:    1. CLL: Bone marrow biopsy completed on October 23, 2017 reviewed independently with involvement with CLL, but unclear percentage.  Although patient was reported to have normal cytogenetics in his bone marrow sample, peripheral blood FISH testing 50% incidence of mutation in the ATM gene which is commonly associated with deletion 11q. 11q- is associated with an unfavorable prognosis and high risk of not responding to initial treatment. 13q- is a more common mutation and is actually associated with a more favorable prognosis. Patient's white blood cell count continues to be within normal limits.  He received his last infusion of single agent  Rituxan on September 03, 2017.  No further intervention is needed.  If patient requires additional treatment could possibly transition him to [place].   2.  Thrombocytopenia: Patient's platelet count remains decreased and has trended down slightly to 41. 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  Patient has received high-dose prednisone, IVIG, and Rituxan with no appreciable durability to improve his platelet count.  Proceed with Nplate injection today will consider increasing dose with next injection if no significant improvement.  Return to clinic in 2 weeks for repeat laboratory work and further evaluation.    3.  Anemia: Despite receiving 2 units of packed red blood cells 4 weeks ago, patient's hemoglobin remains persistently decreased and is now 5.7.  He reports black tarry stools for about a week that have since resolved. Patient had colonoscopy on November 17, 2017 that removed 2 nonmalignant polyps.  EGD on the same day revealed nonbleeding erosive gastropathy with multiple nonbleeding duodenal ulcers.  Patient will return to clinic on Wednesday for 2 units of packed red blood cells.  We will also draw additional blood work to assess for underlying hemolysis at that time.  If there is no evidence of hemolysis, patient will be referred back to GI for repeat EGD and colonoscopy. 4.  Reaction to Rituxan: Rate-based.  Patient will require premedications for any further infusions with Rituxan.    Patient expressed understanding and was in agreement with this plan. He also understands that He can call clinic at any time with any questions, concerns, or complaints.    Lloyd Huger, MD   04/28/2018 12:35 PM

## 2018-04-27 ENCOUNTER — Other Ambulatory Visit: Payer: Self-pay

## 2018-04-27 ENCOUNTER — Other Ambulatory Visit: Payer: Self-pay | Admitting: *Deleted

## 2018-04-27 ENCOUNTER — Inpatient Hospital Stay: Payer: Medicare Other

## 2018-04-27 ENCOUNTER — Inpatient Hospital Stay: Payer: Medicare Other | Attending: Oncology

## 2018-04-27 ENCOUNTER — Encounter: Payer: Self-pay | Admitting: Oncology

## 2018-04-27 ENCOUNTER — Inpatient Hospital Stay (HOSPITAL_BASED_OUTPATIENT_CLINIC_OR_DEPARTMENT_OTHER): Payer: Medicare Other | Admitting: Oncology

## 2018-04-27 VITALS — BP 125/69 | HR 75 | Temp 98.1°F | Ht 71.0 in | Wt 150.3 lb

## 2018-04-27 DIAGNOSIS — Z7982 Long term (current) use of aspirin: Secondary | ICD-10-CM

## 2018-04-27 DIAGNOSIS — D649 Anemia, unspecified: Secondary | ICD-10-CM | POA: Insufficient documentation

## 2018-04-27 DIAGNOSIS — I10 Essential (primary) hypertension: Secondary | ICD-10-CM

## 2018-04-27 DIAGNOSIS — E1122 Type 2 diabetes mellitus with diabetic chronic kidney disease: Secondary | ICD-10-CM | POA: Insufficient documentation

## 2018-04-27 DIAGNOSIS — Z79899 Other long term (current) drug therapy: Secondary | ICD-10-CM | POA: Insufficient documentation

## 2018-04-27 DIAGNOSIS — Z87891 Personal history of nicotine dependence: Secondary | ICD-10-CM

## 2018-04-27 DIAGNOSIS — C9110 Chronic lymphocytic leukemia of B-cell type not having achieved remission: Secondary | ICD-10-CM

## 2018-04-27 DIAGNOSIS — Z794 Long term (current) use of insulin: Secondary | ICD-10-CM | POA: Diagnosis not present

## 2018-04-27 DIAGNOSIS — D693 Immune thrombocytopenic purpura: Secondary | ICD-10-CM | POA: Insufficient documentation

## 2018-04-27 DIAGNOSIS — N183 Chronic kidney disease, stage 3 (moderate): Secondary | ICD-10-CM

## 2018-04-27 LAB — CBC WITH DIFFERENTIAL/PLATELET
Abs Immature Granulocytes: 0.07 10*3/uL (ref 0.00–0.07)
Basophils Absolute: 0 10*3/uL (ref 0.0–0.1)
Basophils Relative: 0 %
Eosinophils Absolute: 0.1 10*3/uL (ref 0.0–0.5)
Eosinophils Relative: 2 %
HCT: 18.1 % — ABNORMAL LOW (ref 39.0–52.0)
Hemoglobin: 5.7 g/dL — ABNORMAL LOW (ref 13.0–17.0)
Immature Granulocytes: 1 %
LYMPHS ABS: 3.2 10*3/uL (ref 0.7–4.0)
Lymphocytes Relative: 52 %
MCH: 32 pg (ref 26.0–34.0)
MCHC: 31.5 g/dL (ref 30.0–36.0)
MCV: 101.7 fL — ABNORMAL HIGH (ref 80.0–100.0)
Monocytes Absolute: 0.4 10*3/uL (ref 0.1–1.0)
Monocytes Relative: 7 %
NEUTROS PCT: 38 %
Neutro Abs: 2.3 10*3/uL (ref 1.7–7.7)
Platelets: 41 10*3/uL — ABNORMAL LOW (ref 150–400)
RBC: 1.78 MIL/uL — ABNORMAL LOW (ref 4.22–5.81)
RDW: 16.8 % — ABNORMAL HIGH (ref 11.5–15.5)
WBC: 6.1 10*3/uL (ref 4.0–10.5)
nRBC: 0 % (ref 0.0–0.2)

## 2018-04-27 LAB — SAMPLE TO BLOOD BANK

## 2018-04-27 MED ORDER — ROMIPLOSTIM 250 MCG ~~LOC~~ SOLR
2.0000 ug/kg | Freq: Once | SUBCUTANEOUS | Status: AC
  Administered 2018-04-27: 135 ug via SUBCUTANEOUS
  Filled 2018-04-27: qty 0.27

## 2018-04-27 NOTE — Progress Notes (Signed)
Patient is here today to follow up on his Idiopathic thrombocytopenic purpura. Patient stated that he had been doing well with no complaints.

## 2018-04-28 ENCOUNTER — Telehealth: Payer: Self-pay | Admitting: *Deleted

## 2018-04-28 NOTE — Telephone Encounter (Signed)
[facility] called to report that patient has antibodies and they are working on getting his blood crossed matched

## 2018-04-28 NOTE — Telephone Encounter (Signed)
Ok, thanks.

## 2018-04-29 ENCOUNTER — Inpatient Hospital Stay: Payer: Medicare Other

## 2018-04-29 DIAGNOSIS — Z87891 Personal history of nicotine dependence: Secondary | ICD-10-CM | POA: Diagnosis not present

## 2018-04-29 DIAGNOSIS — C9110 Chronic lymphocytic leukemia of B-cell type not having achieved remission: Secondary | ICD-10-CM

## 2018-04-29 DIAGNOSIS — N183 Chronic kidney disease, stage 3 (moderate): Secondary | ICD-10-CM | POA: Diagnosis not present

## 2018-04-29 DIAGNOSIS — D693 Immune thrombocytopenic purpura: Secondary | ICD-10-CM | POA: Diagnosis not present

## 2018-04-29 DIAGNOSIS — D649 Anemia, unspecified: Secondary | ICD-10-CM | POA: Diagnosis not present

## 2018-04-29 DIAGNOSIS — E1122 Type 2 diabetes mellitus with diabetic chronic kidney disease: Secondary | ICD-10-CM | POA: Diagnosis not present

## 2018-04-29 LAB — COMPREHENSIVE METABOLIC PANEL
ALBUMIN: 3.5 g/dL (ref 3.5–5.0)
ALT: 16 U/L (ref 0–44)
AST: 28 U/L (ref 15–41)
Alkaline Phosphatase: 100 U/L (ref 38–126)
Anion gap: 6 (ref 5–15)
BILIRUBIN TOTAL: 1.1 mg/dL (ref 0.3–1.2)
BUN: 41 mg/dL — ABNORMAL HIGH (ref 8–23)
CO2: 28 mmol/L (ref 22–32)
Calcium: 9.4 mg/dL (ref 8.9–10.3)
Chloride: 110 mmol/L (ref 98–111)
Creatinine, Ser: 1.4 mg/dL — ABNORMAL HIGH (ref 0.61–1.24)
GFR calc Af Amer: 56 mL/min — ABNORMAL LOW (ref >60)
GFR calc non Af Amer: 48 mL/min — ABNORMAL LOW (ref >60)
Glucose, Bld: 125 mg/dL — ABNORMAL HIGH (ref 70–99)
Potassium: 4.5 mmol/L (ref 3.5–5.1)
Sodium: 144 mmol/L (ref 135–145)
Total Protein: 6.3 g/dL — ABNORMAL LOW (ref 6.5–8.1)

## 2018-04-29 LAB — CBC WITH DIFFERENTIAL/PLATELET
Abs Immature Granulocytes: 0.04 10*3/uL (ref 0.00–0.07)
BASOS PCT: 0 %
Basophils Absolute: 0 10*3/uL (ref 0.0–0.1)
Eosinophils Absolute: 0.1 10*3/uL (ref 0.0–0.5)
Eosinophils Relative: 2 %
HEMATOCRIT: 17.1 % — AB (ref 39.0–52.0)
Hemoglobin: 5.5 g/dL — ABNORMAL LOW (ref 13.0–17.0)
Immature Granulocytes: 1 %
Lymphocytes Relative: 59 %
Lymphs Abs: 3.4 10*3/uL (ref 0.7–4.0)
MCH: 32.7 pg (ref 26.0–34.0)
MCHC: 32.2 g/dL (ref 30.0–36.0)
MCV: 101.8 fL — ABNORMAL HIGH (ref 80.0–100.0)
Monocytes Absolute: 0.3 10*3/uL (ref 0.1–1.0)
Monocytes Relative: 6 %
NEUTROS ABS: 1.8 10*3/uL (ref 1.7–7.7)
Neutrophils Relative %: 32 %
Platelets: 33 10*3/uL — ABNORMAL LOW (ref 150–400)
RBC: 1.68 MIL/uL — ABNORMAL LOW (ref 4.22–5.81)
RDW: 17.2 % — ABNORMAL HIGH (ref 11.5–15.5)
WBC: 5.7 10*3/uL (ref 4.0–10.5)
nRBC: 0 % (ref 0.0–0.2)

## 2018-04-29 LAB — IRON AND TIBC
Iron: 153 ug/dL (ref 45–182)
Saturation Ratios: 56 % — ABNORMAL HIGH (ref 17.9–39.5)
TIBC: 272 ug/dL (ref 250–450)
UIBC: 119 ug/dL

## 2018-04-29 LAB — FERRITIN: Ferritin: 231 ng/mL (ref 24–336)

## 2018-04-29 LAB — LACTATE DEHYDROGENASE: LDH: 244 U/L — ABNORMAL HIGH (ref 98–192)

## 2018-04-29 MED ORDER — ACETAMINOPHEN 325 MG PO TABS
650.0000 mg | ORAL_TABLET | Freq: Once | ORAL | Status: AC
  Administered 2018-04-29: 650 mg via ORAL
  Filled 2018-04-29: qty 2

## 2018-04-29 MED ORDER — DIPHENHYDRAMINE HCL 50 MG/ML IJ SOLN
25.0000 mg | Freq: Once | INTRAMUSCULAR | Status: AC
  Administered 2018-04-29: 25 mg via INTRAVENOUS
  Filled 2018-04-29: qty 1

## 2018-04-29 MED ORDER — SODIUM CHLORIDE 0.9% IV SOLUTION
250.0000 mL | Freq: Once | INTRAVENOUS | Status: AC
  Administered 2018-04-29: 250 mL via INTRAVENOUS
  Filled 2018-04-29: qty 250

## 2018-04-30 LAB — PREPARE RBC (CROSSMATCH)

## 2018-04-30 LAB — HAPTOGLOBIN: Haptoglobin: 37 mg/dL (ref 34–355)

## 2018-05-03 LAB — TYPE AND SCREEN
ABO/RH(D): A POS
Antibody Screen: POSITIVE
DAT, IgG: POSITIVE
DAT, complement: NEGATIVE
Unit division: 0
Unit division: 0

## 2018-05-03 LAB — BPAM RBC
Blood Product Expiration Date: 202003242359
Blood Product Expiration Date: 202003252359
ISSUE DATE / TIME: 202002260927
ISSUE DATE / TIME: 202002261135
Unit Type and Rh: 6200
Unit Type and Rh: 6200

## 2018-05-05 ENCOUNTER — Telehealth: Payer: Self-pay | Admitting: Dietician

## 2018-05-05 NOTE — Telephone Encounter (Signed)
Called patient to check on dietary progress. He reports improved appetite, and is eating regularly throughout the day. He states he has gained several pounds since his visit in this office on 03/19/18. Encouraged patient to call back with any future questions or concerns.

## 2018-05-08 ENCOUNTER — Other Ambulatory Visit: Payer: Self-pay

## 2018-05-08 ENCOUNTER — Other Ambulatory Visit: Payer: Self-pay | Admitting: *Deleted

## 2018-05-08 ENCOUNTER — Inpatient Hospital Stay: Payer: Medicare Other | Attending: Oncology

## 2018-05-08 DIAGNOSIS — N183 Chronic kidney disease, stage 3 (moderate): Secondary | ICD-10-CM | POA: Diagnosis not present

## 2018-05-08 DIAGNOSIS — C9110 Chronic lymphocytic leukemia of B-cell type not having achieved remission: Secondary | ICD-10-CM

## 2018-05-08 DIAGNOSIS — I6523 Occlusion and stenosis of bilateral carotid arteries: Secondary | ICD-10-CM | POA: Insufficient documentation

## 2018-05-08 DIAGNOSIS — D696 Thrombocytopenia, unspecified: Secondary | ICD-10-CM | POA: Insufficient documentation

## 2018-05-08 DIAGNOSIS — D631 Anemia in chronic kidney disease: Secondary | ICD-10-CM | POA: Diagnosis not present

## 2018-05-08 DIAGNOSIS — E1122 Type 2 diabetes mellitus with diabetic chronic kidney disease: Secondary | ICD-10-CM | POA: Diagnosis not present

## 2018-05-08 DIAGNOSIS — D649 Anemia, unspecified: Secondary | ICD-10-CM

## 2018-05-08 LAB — CBC WITH DIFFERENTIAL/PLATELET
Abs Immature Granulocytes: 0.04 10*3/uL (ref 0.00–0.07)
Basophils Absolute: 0 10*3/uL (ref 0.0–0.1)
Basophils Relative: 0 %
Eosinophils Absolute: 0.1 10*3/uL (ref 0.0–0.5)
Eosinophils Relative: 3 %
HEMATOCRIT: 25.4 % — AB (ref 39.0–52.0)
Hemoglobin: 7.9 g/dL — ABNORMAL LOW (ref 13.0–17.0)
IMMATURE GRANULOCYTES: 1 %
Lymphocytes Relative: 63 %
Lymphs Abs: 3.4 10*3/uL (ref 0.7–4.0)
MCH: 29.8 pg (ref 26.0–34.0)
MCHC: 31.1 g/dL (ref 30.0–36.0)
MCV: 95.8 fL (ref 80.0–100.0)
MONO ABS: 0.4 10*3/uL (ref 0.1–1.0)
Monocytes Relative: 7 %
Neutro Abs: 1.4 10*3/uL — ABNORMAL LOW (ref 1.7–7.7)
Neutrophils Relative %: 26 %
Platelets: 36 10*3/uL — ABNORMAL LOW (ref 150–400)
RBC: 2.65 MIL/uL — ABNORMAL LOW (ref 4.22–5.81)
RDW: 20.3 % — ABNORMAL HIGH (ref 11.5–15.5)
Smear Review: DECREASED
WBC: 5.3 10*3/uL (ref 4.0–10.5)
nRBC: 0 % (ref 0.0–0.2)

## 2018-05-09 LAB — SAMPLE TO BLOOD BANK

## 2018-05-10 NOTE — Progress Notes (Signed)
[place]  [phone] [phone]  ID: Scott [name] OB: January 13, 1941  MR#: [id number]  VVZ#:[id number]  Patient Care Team: [name], MD as PCP - General (Family Medicine) Lloyd Huger, MD as Consulting Physician (Oncology) Clent Jacks, MD as Consulting Physician (Ophthalmology) Dorothy Spark, MD as Consulting Physician (Cardiology) Madelon Lips, MD as Consulting Physician (Nephrology)  CHIEF COMPLAINT: 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  INTERVAL HISTORY: Patient returns to clinic today for repeat laboratory, further evaluation and continuation of Nplate.  He continues to have chronic weakness and fatigue, but this is mildly improved.   He does not complain of any dizziness today.  He has no further black tarry stools.  He continues to have easy bruising.  He has no neurologic complaints. He has a good appetite and denies weight loss. He denies any night sweats. He has noted no lymphadenopathy. He has no chest pain or shortness of breath. He denies any nausea, vomiting, constipation, or diarrhea. He has no urinary complaints.  Patient offers no further specific complaints today.  REVIEW OF SYSTEMS:   Review of Systems  Constitutional: Positive for malaise/fatigue. Negative for fever and weight loss.  Respiratory: Negative.  Negative for cough and shortness of breath.   Cardiovascular: Negative.  Negative for chest pain and leg swelling.  Gastrointestinal: Negative.  Negative for abdominal pain, blood in stool and melena.  Genitourinary: Negative.  Negative for dysuria and hematuria.  Musculoskeletal: Negative.  Negative for back pain and neck pain.  Skin: Negative.  Negative for itching and rash.  Neurological: Positive for weakness. Negative for dizziness, sensory change, focal weakness and headaches.  Endo/Heme/Allergies: Bruises/bleeds easily.  Psychiatric/Behavioral: Negative.  The patient is not  nervous/anxious.     As per HPI. Otherwise, a complete review of systems is negative.  PAST MEDICAL HISTORY: Past Medical History:  Diagnosis Date  . Acquired hand deformity 1962   hand saw accident at work  . Anemia 10/11/2011  . Arthritis   . Bradycardia 10/10/2011  . CAD (coronary artery disease) 10/10/2011   MI s/p PTCA (Dx-OM2 proximal concentric stenosis)  . CKD (chronic kidney disease) stage 3, GFR 30-59 ml/min (HCC)    Scott [name]  . CLL (chronic lymphocytic leukemia) (Scott [name])   . Colon polyps   . Generalized headaches    frequent  . GI bleed 07/08/2017  . Glaucoma    s/p laser surgery  . HLD (hyperlipidemia)   . Hypertension   . Hypothyroidism 10/10/2011  . Thrombocytopenia (Scott [name]) 10/11/2011  . Type 2 diabetes with nephropathy Scott [name])    DM refresher course [facility] (04/2013)    PAST SURGICAL HISTORY: Past Surgical History:  Procedure Laterality Date  . BACK SURGERY     cervical neck  . CATARACT EXTRACTION, BILATERAL Bilateral 2017  . COLONOSCOPY  11/2008   1 polyp, diverticulosis, rec rpt 5 yrs (Dr. Oletta [name], Scott [name])  . COLONOSCOPY  06/2014   hyperplastic polyp, rpt 5 yrs (Scott [name])  . COLONOSCOPY WITH PROPOFOL N/A 11/17/2017   TA, HP, (Scott [name], Scott Due, MD)  . ESOPHAGOGASTRODUODENOSCOPY (EGD) WITH PROPOFOL N/A 11/17/2017   healing erosive gastritis, intestinal metaplasia, neg H pylori (Scott [name], Scott Due, MD)  . EYE SURGERY  2012   laser surgery for glaucoma  . PTCA  1994, 1995  . US ECHOCARDIOGRAPHY  10/2013   inferior wall hypokinesis, mild LVH, EF 50-55%, mild MR and LA dilation    FAMILY HISTORY: Family History  Problem Relation Age of Onset  .  Diabetes Sister   . Stomach cancer Sister 37  . Pneumonia Father        caused death  . Other Brother        no communication with brother so unsure of any health conditions  . Coronary artery disease Son 21       5v CABG and stents  . Hyperlipidemia Sister   . Stroke Neg Hx   . Heart attack Neg Hx     ADVANCED DIRECTIVES  (Y/N):  N  HEALTH MAINTENANCE: Social History   Tobacco Use  . Smoking status: Former Smoker    Last attempt to quit: 03/04/1978    Years since quitting: 40.2  . Smokeless tobacco: Never Used  Substance Use Topics  . Alcohol use: No  . Drug use: No     Colonoscopy:  PAP:  Bone density:  Lipid panel:  No Known Allergies  Current Outpatient Medications  Medication Sig Dispense Refill  . acetaminophen (TYLENOL) 650 MG CR tablet Take 650 mg by mouth every 8 (eight) hours as needed for pain.    Marland Kitchen aspirin EC 81 MG tablet Take 1 tablet (81 mg total) by mouth daily. 90 tablet 3  . atorvastatin (LIPITOR) 80 MG tablet Take 80 mg by mouth daily.    . carvedilol (COREG) 6.25 MG tablet TAKE 1 TABLET BY MOUTH TWICE DAILY 180 tablet 3  . glucose blood (ONE TOUCH ULTRA TEST) test strip 1 each by Other route 4 (four) times daily. and as needed to check sugars Dx code:  E11.21 400 each 3  . insulin aspart (NOVOLOG) 100 UNIT/ML FlexPen Take 5 units with meals, take 10 units if mealtime sugar >250 15 mL 3  . Insulin Detemir (LEVEMIR FLEXTOUCH) 100 UNIT/ML Pen INJECT 25 UNITS SUBCUTANEOUSLY ONCE DAILY AT  10PM 15 pen 3  . levothyroxine (SYNTHROID, LEVOTHROID) 75 MCG tablet Take 1 tablet (75 mcg total) by mouth daily. 90 tablet 1  . nitroGLYCERIN (NITROSTAT) 0.4 MG SL tablet Place 1 tablet (0.4 mg total) under the tongue every 5 (five) minutes as needed for chest pain ((MAX of 3 doses)). 25 tablet 0  . traZODone (DESYREL) 50 MG tablet Take 1.5 tablets (75 mg total) by mouth at bedtime as needed for sleep.    . furosemide (LASIX) 20 MG tablet Take 1 tablet (20 mg total) by mouth daily. 30 tablet 4   No current facility-administered medications for this visit.     OBJECTIVE: Vitals:   05/11/18 0832  BP: 118/64  Pulse: 78  Temp: 97.8 F (36.6 C)     Body mass index is 20.85 kg/m.    ECOG FS:0 - Asymptomatic  General: Well-developed, well-nourished, no acute distress. Eyes: Pink conjunctiva,  anicteric sclera. HEENT: Normocephalic, moist mucous membranes. Lungs: Clear to auscultation bilaterally. Heart: Regular rate and rhythm. No rubs, murmurs, or gallops. Abdomen: Soft, nontender, nondistended. No organomegaly noted, normoactive bowel sounds. Musculoskeletal: No edema, cyanosis, or clubbing. Neuro: Alert, answering all questions appropriately. Cranial nerves grossly intact. Skin: No rashes or petechiae noted. Psych: Normal affect.  LAB RESULTS:  Lab Results  Component Value Date   NA 144 04/29/2018   K 4.5 04/29/2018   CL 110 04/29/2018   CO2 28 04/29/2018   GLUCOSE 125 (H) 04/29/2018   BUN 41 (H) 04/29/2018   CREATININE 1.40 (H) 04/29/2018   CALCIUM 9.4 04/29/2018   PROT 6.3 (L) 04/29/2018   ALBUMIN 3.5 04/29/2018   AST 28 04/29/2018   ALT 16 04/29/2018  ALKPHOS 100 04/29/2018   BILITOT 1.1 04/29/2018   GFRNONAA 48 (L) 04/29/2018   GFRAA 56 (L) 04/29/2018    Lab Results  Component Value Date   WBC 5.3 05/08/2018   NEUTROABS 1.4 (L) 05/08/2018   HGB 7.9 (L) 05/08/2018   HCT 25.4 (L) 05/08/2018   MCV 95.8 05/08/2018   PLT 36 (L) 05/08/2018     STUDIES: No results found.  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  PLAN:    1. CLL: Bone marrow biopsy completed on October 23, 2017 reviewed independently with involvement with CLL, but unclear percentage.  Although patient was reported to have normal cytogenetics in his bone marrow sample, peripheral blood FISH testing 50% incidence of mutation in the ATM gene which is commonly associated with deletion 11q. 11q- is associated with an unfavorable prognosis and high risk of not responding to initial treatment. 13q- is a more common mutation and is actually associated with a more favorable prognosis. Patient's white blood cell count continues to be within normal limits.  He received his last infusion of single agent Rituxan on September 03, 2017. If patient requires additional treatment could  possibly transition him to [place].   2.  Thrombocytopenia: Patient's platelet count remains decreased, but essentially stable at 36. 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  Patient has received high-dose prednisone, IVIG, and Rituxan with no appreciable durability to improve his platelet count.  Will increase dose of Nplate to 3 mcg/kg today.  Return to clinic in 2 weeks with repeat laboratory work and further evaluation.  3.  Anemia: Patient's hemoglobin improved with 2 units of packed red blood cells and is now 7.9.  He previously reported black tarry stools, but none recently.  Patient had colonoscopy on November 17, 2017 that removed 2 nonmalignant polyps.  EGD on the same day revealed nonbleeding erosive gastropathy with multiple nonbleeding duodenal ulcers.  Consider referral back to GI.  Patient does not require blood transfusion today, but may benefit from Procrit in the future.   4.  Reaction to Rituxan: Rate-based.  Patient will require premedications for any further infusions with Rituxan.    Patient expressed understanding and was in agreement with this plan. He also understands that He can call clinic at any time with any questions, concerns, or complaints.    Lloyd Huger, MD   05/11/2018 12:50 PM

## 2018-05-11 ENCOUNTER — Inpatient Hospital Stay (HOSPITAL_BASED_OUTPATIENT_CLINIC_OR_DEPARTMENT_OTHER): Payer: Medicare Other | Admitting: Oncology

## 2018-05-11 ENCOUNTER — Inpatient Hospital Stay: Payer: Medicare Other

## 2018-05-11 ENCOUNTER — Encounter: Payer: Self-pay | Admitting: Oncology

## 2018-05-11 ENCOUNTER — Other Ambulatory Visit: Payer: Self-pay

## 2018-05-11 VITALS — BP 118/64 | HR 78 | Temp 97.8°F | Ht 71.5 in | Wt 151.6 lb

## 2018-05-11 DIAGNOSIS — N183 Chronic kidney disease, stage 3 (moderate): Secondary | ICD-10-CM | POA: Diagnosis not present

## 2018-05-11 DIAGNOSIS — I6523 Occlusion and stenosis of bilateral carotid arteries: Secondary | ICD-10-CM | POA: Diagnosis not present

## 2018-05-11 DIAGNOSIS — D696 Thrombocytopenia, unspecified: Secondary | ICD-10-CM | POA: Diagnosis not present

## 2018-05-11 DIAGNOSIS — C9110 Chronic lymphocytic leukemia of B-cell type not having achieved remission: Secondary | ICD-10-CM

## 2018-05-11 DIAGNOSIS — D693 Immune thrombocytopenic purpura: Secondary | ICD-10-CM | POA: Diagnosis not present

## 2018-05-11 DIAGNOSIS — D631 Anemia in chronic kidney disease: Secondary | ICD-10-CM | POA: Diagnosis not present

## 2018-05-11 DIAGNOSIS — E1122 Type 2 diabetes mellitus with diabetic chronic kidney disease: Secondary | ICD-10-CM | POA: Diagnosis not present

## 2018-05-11 MED ORDER — ROMIPLOSTIM 250 MCG ~~LOC~~ SOLR
3.0000 ug/kg | Freq: Once | SUBCUTANEOUS | Status: AC
  Administered 2018-05-11: 205 ug via SUBCUTANEOUS
  Filled 2018-05-11: qty 0.41

## 2018-05-11 NOTE — Progress Notes (Signed)
Patient is here today to follow up on his CLL. Patient stated that he had been doing well with no complaints.

## 2018-05-20 ENCOUNTER — Other Ambulatory Visit: Payer: Self-pay | Admitting: Cardiology

## 2018-05-20 DIAGNOSIS — E7849 Other hyperlipidemia: Secondary | ICD-10-CM

## 2018-05-20 DIAGNOSIS — I10 Essential (primary) hypertension: Secondary | ICD-10-CM

## 2018-05-20 DIAGNOSIS — E785 Hyperlipidemia, unspecified: Secondary | ICD-10-CM

## 2018-05-20 DIAGNOSIS — I2510 Atherosclerotic heart disease of native coronary artery without angina pectoris: Secondary | ICD-10-CM

## 2018-05-21 ENCOUNTER — Other Ambulatory Visit: Payer: Self-pay

## 2018-05-22 ENCOUNTER — Other Ambulatory Visit: Payer: Self-pay | Admitting: Oncology

## 2018-05-22 ENCOUNTER — Other Ambulatory Visit: Payer: Self-pay

## 2018-05-22 ENCOUNTER — Other Ambulatory Visit: Payer: Self-pay | Admitting: *Deleted

## 2018-05-22 ENCOUNTER — Inpatient Hospital Stay: Payer: Medicare Other

## 2018-05-22 DIAGNOSIS — I6523 Occlusion and stenosis of bilateral carotid arteries: Secondary | ICD-10-CM | POA: Diagnosis not present

## 2018-05-22 DIAGNOSIS — D696 Thrombocytopenia, unspecified: Secondary | ICD-10-CM | POA: Diagnosis not present

## 2018-05-22 DIAGNOSIS — C9110 Chronic lymphocytic leukemia of B-cell type not having achieved remission: Secondary | ICD-10-CM

## 2018-05-22 DIAGNOSIS — N183 Chronic kidney disease, stage 3 (moderate): Secondary | ICD-10-CM | POA: Diagnosis not present

## 2018-05-22 DIAGNOSIS — D649 Anemia, unspecified: Secondary | ICD-10-CM

## 2018-05-22 DIAGNOSIS — E1122 Type 2 diabetes mellitus with diabetic chronic kidney disease: Secondary | ICD-10-CM | POA: Diagnosis not present

## 2018-05-22 DIAGNOSIS — D631 Anemia in chronic kidney disease: Secondary | ICD-10-CM | POA: Diagnosis not present

## 2018-05-22 LAB — CBC WITH DIFFERENTIAL/PLATELET
Abs Immature Granulocytes: 0.04 10*3/uL (ref 0.00–0.07)
Basophils Absolute: 0 10*3/uL (ref 0.0–0.1)
Basophils Relative: 0 %
EOS PCT: 2 %
Eosinophils Absolute: 0.1 10*3/uL (ref 0.0–0.5)
HCT: 17.7 % — ABNORMAL LOW (ref 39.0–52.0)
Hemoglobin: 5.7 g/dL — ABNORMAL LOW (ref 13.0–17.0)
Immature Granulocytes: 1 %
LYMPHS PCT: 61 %
Lymphs Abs: 2.8 10*3/uL (ref 0.7–4.0)
MCH: 30.2 pg (ref 26.0–34.0)
MCHC: 32.2 g/dL (ref 30.0–36.0)
MCV: 93.7 fL (ref 80.0–100.0)
Monocytes Absolute: 0.3 10*3/uL (ref 0.1–1.0)
Monocytes Relative: 7 %
Neutro Abs: 1.3 10*3/uL — ABNORMAL LOW (ref 1.7–7.7)
Neutrophils Relative %: 29 %
Platelets: 33 10*3/uL — ABNORMAL LOW (ref 150–400)
RBC: 1.89 MIL/uL — ABNORMAL LOW (ref 4.22–5.81)
RDW: 22.1 % — ABNORMAL HIGH (ref 11.5–15.5)
Smear Review: NORMAL
WBC: 4.6 10*3/uL (ref 4.0–10.5)
nRBC: 0 % (ref 0.0–0.2)

## 2018-05-22 LAB — SAMPLE TO BLOOD BANK

## 2018-05-24 ENCOUNTER — Other Ambulatory Visit: Payer: Self-pay

## 2018-05-24 LAB — PREPARE RBC (CROSSMATCH)

## 2018-05-24 NOTE — Progress Notes (Signed)
[place]  [phone] [phone]) [phone]  ID: Scott [name] OB: 05/21/1940  MR#: [id number]  YIA#:[id number]  Patient Care Team: [name], MD as PCP - General (Family Medicine) Lloyd Huger, MD as Consulting Physician (Oncology) Clent Jacks, MD as Consulting Physician (Ophthalmology) Dorothy Spark, MD as Consulting Physician (Cardiology) Madelon Lips, MD as Consulting Physician (Nephrology)  CHIEF COMPLAINT: 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  INTERVAL HISTORY: Patient returns to clinic today for repeat laboratory work, further evaluation, and continuation of Nplate, Procrit and blood transfusion.  He has worsening weakness and fatigue, but denies any dizziness. He has no further black tarry stools.  He continues to have easy bruising.  He has no neurologic complaints. He has a good appetite and denies weight loss. He denies any night sweats. He has noted no lymphadenopathy. He has no chest pain or shortness of breath. He denies any nausea, vomiting, constipation, or diarrhea. He has no urinary complaints.  Patient offers no further specific complaints today.    REVIEW OF SYSTEMS:   Review of Systems  Constitutional: Positive for malaise/fatigue. Negative for fever and weight loss.  Respiratory: Negative.  Negative for cough and shortness of breath.   Cardiovascular: Negative.  Negative for chest pain and leg swelling.  Gastrointestinal: Negative.  Negative for abdominal pain, blood in stool and melena.  Genitourinary: Negative.  Negative for dysuria and hematuria.  Musculoskeletal: Negative.  Negative for back pain and neck pain.  Skin: Negative.  Negative for itching and rash.  Neurological: Positive for weakness. Negative for dizziness, sensory change, focal weakness and headaches.  Endo/Heme/Allergies: Bruises/bleeds easily.  Psychiatric/Behavioral: Negative.  The patient is not nervous/anxious.     As per HPI.  Otherwise, a complete review of systems is negative.  PAST MEDICAL HISTORY: Past Medical History:  Diagnosis Date   Acquired hand deformity 1962   hand saw accident at work   Anemia 10/11/2011   Arthritis    Bradycardia 10/10/2011   CAD (coronary artery disease) 10/10/2011   MI s/p PTCA (Dx-OM2 proximal concentric stenosis)   CKD (chronic kidney disease) stage 3, GFR 30-59 ml/min (HCC)    [name]   CLL (chronic lymphocytic leukemia) ([place])    Colon polyps    Generalized headaches    frequent   GI bleed 07/08/2017   Glaucoma    s/p laser surgery   HLD (hyperlipidemia)    Hypertension    Hypothyroidism 10/10/2011   Thrombocytopenia ([name]) 10/11/2011   Type 2 diabetes with nephropathy ([place])    DM refresher course [facility] (04/2013)    PAST SURGICAL HISTORY: Past Surgical History:  Procedure Laterality Date   BACK SURGERY     cervical neck   CATARACT EXTRACTION, BILATERAL Bilateral 2017   COLONOSCOPY  11/2008   1 polyp, diverticulosis, rec rpt 5 yrs (Dr. Oletta Lamas, [name])   COLONOSCOPY  06/2014   hyperplastic polyp, rpt 5 yrs ([place])   COLONOSCOPY WITH PROPOFOL N/A 11/17/2017   TA, HP, ([place], Tally Due, MD)   ESOPHAGOGASTRODUODENOSCOPY (EGD) WITH PROPOFOL N/A 11/17/2017   healing erosive gastritis, intestinal metaplasia, neg H pylori ([place], Tally Due, MD)   EYE SURGERY  2012   laser surgery for glaucoma   PTCA  1994, 1995   US ECHOCARDIOGRAPHY  10/2013   inferior wall hypokinesis, mild LVH, EF 50-55%, mild MR and LA dilation    FAMILY HISTORY: Family History  Problem Relation Age of Onset   Diabetes Sister    Stomach cancer  Sister 102   Pneumonia Father        caused death   Other Brother        no communication with brother so unsure of any health conditions   Coronary artery disease Son 60       5v CABG and stents   Hyperlipidemia Sister    Stroke Neg Hx    Heart attack Neg Hx     ADVANCED DIRECTIVES (Y/N):  N  HEALTH  MAINTENANCE: Social History   Tobacco Use   Smoking status: Former Smoker    Last attempt to quit: 03/04/1978    Years since quitting: 40.2   Smokeless tobacco: Never Used  Substance Use Topics   Alcohol use: No   Drug use: No     Colonoscopy:  PAP:  Bone density:  Lipid panel:  No Known Allergies  Current Outpatient Medications  Medication Sig Dispense Refill   acetaminophen (TYLENOL) 650 MG CR tablet Take 650 mg by mouth every 8 (eight) hours as needed for pain.     aspirin EC 81 MG tablet Take 1 tablet (81 mg total) by mouth daily. 90 tablet 3   atorvastatin (LIPITOR) 80 MG tablet Take 80 mg by mouth daily.     carvedilol (COREG) 6.25 MG tablet Take 1 tablet (6.25 mg total) by mouth 2 (two) times daily. Please make annual appt for future refills. Thank you 180 tablet 0   glucose blood (ONE TOUCH ULTRA TEST) test strip 1 each by Other route 4 (four) times daily. and as needed to check sugars Dx code:  E11.21 400 each 3   insulin aspart (NOVOLOG) 100 UNIT/ML FlexPen Take 5 units with meals, take 10 units if mealtime sugar >250 15 mL 3   Insulin Detemir (LEVEMIR FLEXTOUCH) 100 UNIT/ML Pen INJECT 25 UNITS SUBCUTANEOUSLY ONCE DAILY AT  10PM 15 pen 3   levothyroxine (SYNTHROID, LEVOTHROID) 75 MCG tablet Take 1 tablet (75 mcg total) by mouth daily. 90 tablet 1   nitroGLYCERIN (NITROSTAT) 0.4 MG SL tablet Place 1 tablet (0.4 mg total) under the tongue every 5 (five) minutes as needed for chest pain ((MAX of 3 doses)). 25 tablet 0   traZODone (DESYREL) 50 MG tablet Take 1.5 tablets (75 mg total) by mouth at bedtime as needed for sleep.     furosemide (LASIX) 20 MG tablet Take 1 tablet (20 mg total) by mouth daily. 30 tablet 4   No current facility-administered medications for this visit.     OBJECTIVE: Vitals:   05/25/18 0854  BP: 113/66  Pulse: 65  Resp: 18  Temp: (!) 96.5 F (35.8 C)     Body mass index is 21.45 kg/m.    ECOG FS:0 - Asymptomatic  General:  Well-developed, well-nourished, no acute distress. Eyes: Pink conjunctiva, anicteric sclera. HEENT: Normocephalic, moist mucous membranes. Lungs: Clear to auscultation bilaterally. Heart: Regular rate and rhythm. No rubs, murmurs, or gallops. Abdomen: Soft, nontender, nondistended. No organomegaly noted, normoactive bowel sounds. Musculoskeletal: No edema, cyanosis, or clubbing. Neuro: Alert, answering all questions appropriately. Cranial nerves grossly intact. Skin: No rashes or petechiae noted. Psych: Normal affect.  LAB RESULTS:  Lab Results  Component Value Date   NA 144 04/29/2018   K 4.5 04/29/2018   CL 110 04/29/2018   CO2 28 04/29/2018   GLUCOSE 125 (H) 04/29/2018   BUN 41 (H) 04/29/2018   CREATININE 1.40 (H) 04/29/2018   CALCIUM 9.4 04/29/2018   PROT 6.3 (L) 04/29/2018   ALBUMIN 3.5 04/29/2018  AST 28 04/29/2018   ALT 16 04/29/2018   ALKPHOS 100 04/29/2018   BILITOT 1.1 04/29/2018   GFRNONAA 48 (L) 04/29/2018   GFRAA 56 (L) 04/29/2018    Lab Results  Component Value Date   WBC 4.6 05/22/2018   NEUTROABS 1.3 (L) 05/22/2018   HGB 5.7 (L) 05/22/2018   HCT 17.7 (L) 05/22/2018   MCV 93.7 05/22/2018   PLT 33 (L) 05/22/2018     STUDIES: No results found.  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  PLAN:    1. CLL: Bone marrow biopsy completed on October 23, 2017 reviewed independently with involvement with CLL, but unclear percentage.  Although patient was reported to have normal cytogenetics in his bone marrow sample, peripheral blood FISH testing 50% incidence of mutation in the ATM gene which is commonly associated with deletion 11q. 11q- is associated with an unfavorable prognosis and high risk of not responding to initial treatment. 13q- is a more common mutation and is actually associated with a more favorable prognosis. Patient's white blood cell count continues to be within normal limits.  He received his last infusion of single agent  Rituxan on September 03, 2017. If patient requires additional treatment could possibly transition him to [place].   2.  Thrombocytopenia: Patient's platelet count is decreased, but stable at 33. 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  Patient has received high-dose prednisone, IVIG, and Rituxan with no appreciable durability to improve his platelet count.  Proceed with 3 mcg/kg Nplate today.  Return to clinic in 2 weeks with repeat laboratory work and further evaluation.   3.  Anemia: Patient's hemoglobin has trended down and is now 5.7.  Patient has no evidence of hemolysis and his iron stores are within normal limits.  Given decreased blood supply, patient will only receive 1 unit of packed red blood cells today.  He will also receive Procrit.  He previously reported black tarry stools, but none recently.  Patient had colonoscopy on November 17, 2017 that removed 2 nonmalignant polyps.  EGD on the same day revealed nonbleeding erosive gastropathy with multiple nonbleeding duodenal ulcers.  Consider referral back to GI.  Return to clinic in 2 weeks as above.  We also discussed the possibility of repeating a bone marrow biopsy once the COVID-19 pandemic resolves.  4.  Reaction to Rituxan: Rate-based.  Patient will require premedications for any further infusions with Rituxan.    Patient expressed understanding and was in agreement with this plan. He also understands that He can call clinic at any time with any questions, concerns, or complaints.    Lloyd Huger, MD   05/25/2018 2:52 PM

## 2018-05-25 ENCOUNTER — Encounter: Payer: Self-pay | Admitting: Oncology

## 2018-05-25 ENCOUNTER — Other Ambulatory Visit: Payer: Medicare Other

## 2018-05-25 ENCOUNTER — Ambulatory Visit: Payer: Medicare Other

## 2018-05-25 ENCOUNTER — Inpatient Hospital Stay: Payer: Medicare Other

## 2018-05-25 ENCOUNTER — Other Ambulatory Visit: Payer: Self-pay

## 2018-05-25 ENCOUNTER — Inpatient Hospital Stay (HOSPITAL_BASED_OUTPATIENT_CLINIC_OR_DEPARTMENT_OTHER): Payer: Medicare Other | Admitting: Oncology

## 2018-05-25 ENCOUNTER — Ambulatory Visit: Payer: Medicare Other | Admitting: Oncology

## 2018-05-25 VITALS — BP 113/66 | HR 65 | Temp 96.5°F | Resp 18 | Wt 156.0 lb

## 2018-05-25 VITALS — BP 144/70 | HR 64 | Temp 96.4°F | Resp 18

## 2018-05-25 DIAGNOSIS — E1122 Type 2 diabetes mellitus with diabetic chronic kidney disease: Secondary | ICD-10-CM | POA: Diagnosis not present

## 2018-05-25 DIAGNOSIS — C9110 Chronic lymphocytic leukemia of B-cell type not having achieved remission: Secondary | ICD-10-CM

## 2018-05-25 DIAGNOSIS — I6523 Occlusion and stenosis of bilateral carotid arteries: Secondary | ICD-10-CM

## 2018-05-25 DIAGNOSIS — N183 Chronic kidney disease, stage 3 (moderate): Secondary | ICD-10-CM | POA: Diagnosis not present

## 2018-05-25 DIAGNOSIS — D693 Immune thrombocytopenic purpura: Secondary | ICD-10-CM

## 2018-05-25 DIAGNOSIS — D631 Anemia in chronic kidney disease: Secondary | ICD-10-CM | POA: Diagnosis not present

## 2018-05-25 DIAGNOSIS — D696 Thrombocytopenia, unspecified: Secondary | ICD-10-CM

## 2018-05-25 MED ORDER — DIPHENHYDRAMINE HCL 50 MG/ML IJ SOLN
25.0000 mg | Freq: Once | INTRAMUSCULAR | Status: AC
  Administered 2018-05-25: 25 mg via INTRAVENOUS
  Filled 2018-05-25: qty 1

## 2018-05-25 MED ORDER — EPOETIN ALFA 40000 UNIT/ML IJ SOLN
40000.0000 [IU] | Freq: Once | INTRAMUSCULAR | Status: AC
  Administered 2018-05-25: 40000 [IU] via SUBCUTANEOUS
  Filled 2018-05-25: qty 1

## 2018-05-25 MED ORDER — SODIUM CHLORIDE 0.9% IV SOLUTION
250.0000 mL | Freq: Once | INTRAVENOUS | Status: AC
  Administered 2018-05-25: 250 mL via INTRAVENOUS
  Filled 2018-05-25: qty 250

## 2018-05-25 MED ORDER — ACETAMINOPHEN 325 MG PO TABS
650.0000 mg | ORAL_TABLET | Freq: Once | ORAL | Status: AC
  Administered 2018-05-25: 650 mg via ORAL
  Filled 2018-05-25: qty 2

## 2018-05-25 MED ORDER — ROMIPLOSTIM 250 MCG ~~LOC~~ SOLR
3.0000 ug/kg | Freq: Once | SUBCUTANEOUS | Status: AC
  Administered 2018-05-25: 210 ug via SUBCUTANEOUS
  Filled 2018-05-25: qty 0.42

## 2018-05-25 NOTE — Progress Notes (Signed)
Patient denies any concerns today.  

## 2018-05-30 ENCOUNTER — Emergency Department
Admission: EM | Admit: 2018-05-30 | Discharge: 2018-05-30 | Disposition: A | Discharge status: Home / Self Care | Payer: Medicare Other | Attending: Emergency Medicine | Admitting: Emergency Medicine

## 2018-05-30 ENCOUNTER — Other Ambulatory Visit: Payer: Self-pay

## 2018-05-30 DIAGNOSIS — N183 Chronic kidney disease, stage 3 (moderate): Secondary | ICD-10-CM | POA: Insufficient documentation

## 2018-05-30 DIAGNOSIS — Z794 Long term (current) use of insulin: Secondary | ICD-10-CM | POA: Insufficient documentation

## 2018-05-30 DIAGNOSIS — R040 Epistaxis: Secondary | ICD-10-CM | POA: Insufficient documentation

## 2018-05-30 DIAGNOSIS — Z79899 Other long term (current) drug therapy: Secondary | ICD-10-CM | POA: Insufficient documentation

## 2018-05-30 DIAGNOSIS — I129 Hypertensive chronic kidney disease with stage 1 through stage 4 chronic kidney disease, or unspecified chronic kidney disease: Secondary | ICD-10-CM | POA: Insufficient documentation

## 2018-05-30 DIAGNOSIS — E1140 Type 2 diabetes mellitus with diabetic neuropathy, unspecified: Secondary | ICD-10-CM | POA: Diagnosis not present

## 2018-05-30 DIAGNOSIS — Z7982 Long term (current) use of aspirin: Secondary | ICD-10-CM | POA: Insufficient documentation

## 2018-05-30 DIAGNOSIS — I2510 Atherosclerotic heart disease of native coronary artery without angina pectoris: Secondary | ICD-10-CM | POA: Diagnosis not present

## 2018-05-30 DIAGNOSIS — E039 Hypothyroidism, unspecified: Secondary | ICD-10-CM | POA: Insufficient documentation

## 2018-05-30 DIAGNOSIS — Z87891 Personal history of nicotine dependence: Secondary | ICD-10-CM | POA: Insufficient documentation

## 2018-05-30 DIAGNOSIS — D696 Thrombocytopenia, unspecified: Secondary | ICD-10-CM | POA: Diagnosis not present

## 2018-05-30 DIAGNOSIS — E1122 Type 2 diabetes mellitus with diabetic chronic kidney disease: Secondary | ICD-10-CM | POA: Insufficient documentation

## 2018-05-30 LAB — TYPE AND SCREEN
ABO/RH(D): A POS
Antibody Screen: NEGATIVE
DAT, COMPLEMENT: NEGATIVE
DAT, IgG: POSITIVE
Donor AG Type: NEGATIVE
EXTEND SAMPLE REASON: TRANSFUSED
Unit division: 0

## 2018-05-30 LAB — CBC WITH DIFFERENTIAL/PLATELET
Abs Immature Granulocytes: 0.04 10*3/uL (ref 0.00–0.07)
Basophils Absolute: 0 10*3/uL (ref 0.0–0.1)
Basophils Relative: 1 %
Eosinophils Absolute: 0.1 10*3/uL (ref 0.0–0.5)
Eosinophils Relative: 2 %
HCT: 22.5 % — ABNORMAL LOW (ref 39.0–52.0)
Hemoglobin: 7.1 g/dL — ABNORMAL LOW (ref 13.0–17.0)
IMMATURE GRANULOCYTES: 1 %
LYMPHS ABS: 4.1 10*3/uL — AB (ref 0.7–4.0)
Lymphocytes Relative: 62 %
MCH: 30.7 pg (ref 26.0–34.0)
MCHC: 31.6 g/dL (ref 30.0–36.0)
MCV: 97.4 fL (ref 80.0–100.0)
MONO ABS: 0.4 10*3/uL (ref 0.1–1.0)
Monocytes Relative: 6 %
Neutro Abs: 1.8 10*3/uL (ref 1.7–7.7)
Neutrophils Relative %: 28 %
Platelets: 22 10*3/uL — CL (ref 150–400)
RBC: 2.31 MIL/uL — ABNORMAL LOW (ref 4.22–5.81)
RDW: 21.3 % — ABNORMAL HIGH (ref 11.5–15.5)
Smear Review: DECREASED
WBC Morphology: ABNORMAL
WBC: 6.4 10*3/uL (ref 4.0–10.5)
nRBC: 0 % (ref 0.0–0.2)

## 2018-05-30 LAB — PROTIME-INR
INR: 1 (ref 0.8–1.2)
Prothrombin Time: 12.8 seconds (ref 11.4–15.2)

## 2018-05-30 LAB — BPAM RBC
Blood Product Expiration Date: 202003252359
ISSUE DATE / TIME: 202003231133
Unit Type and Rh: 9500

## 2018-05-30 LAB — BASIC METABOLIC PANEL
Anion gap: 9 (ref 5–15)
BUN: 43 mg/dL — ABNORMAL HIGH (ref 8–23)
CO2: 26 mmol/L (ref 22–32)
Calcium: 9.5 mg/dL (ref 8.9–10.3)
Chloride: 109 mmol/L (ref 98–111)
Creatinine, Ser: 1.47 mg/dL — ABNORMAL HIGH (ref 0.61–1.24)
GFR calc Af Amer: 53 mL/min — ABNORMAL LOW (ref >60)
GFR calc non Af Amer: 45 mL/min — ABNORMAL LOW (ref >60)
GLUCOSE: 88 mg/dL (ref 70–99)
Potassium: 4.3 mmol/L (ref 3.5–5.1)
Sodium: 144 mmol/L (ref 135–145)

## 2018-05-30 MED ORDER — SODIUM CHLORIDE 0.9 % IV SOLN: 10.0000 mL/h | Freq: Once | INTRAVENOUS | Status: DC

## 2018-05-30 MED ORDER — OXYMETAZOLINE HCL 0.05 % NA SOLN
1.0000 | Freq: Once | NASAL | Status: AC
  Administered 2018-05-30: 1 via NASAL
  Filled 2018-05-30: qty 30

## 2018-05-30 MED ORDER — OXYMETAZOLINE HCL 0.05 % NA SOLN: 2.0000 | Freq: Once | NASAL | Status: DC

## 2018-05-30 MED ORDER — AMOXICILLIN-POT CLAVULANATE 875-125 MG PO TABS: 1.0000 | ORAL_TABLET | Freq: Two times a day (BID) | ORAL | 0 refills | Status: AC

## 2018-05-30 NOTE — ED Notes (Signed)
Pt's son [place] [phone].

## 2018-05-30 NOTE — ED Provider Notes (Signed)
[facility] Emergency Department Provider Note ____________________________________________   First MD Initiated Contact with Patient 05/30/18 [phone]     (approximate)  I have reviewed the triage vital signs and the nursing notes.   HISTORY  Chief Complaint Epistaxis    HPI Scott [name] is a 78 y.o. male with PMH as noted below including CLL and thrombocytopenia who presents with epistaxis, acute onset approximately 2 hours ago, persistent course since then, and not relieved by pressure with a plastic clip.  The patient denies any associated lightheadedness or any pain.  He has had brief nosebleeds in the past that have resolved with pressure for short time, and states he had to come to the hospital once for nosebleed about a year ago.   Past Medical History:  Diagnosis Date  . Acquired hand deformity 1962   hand saw accident at work  . Anemia 10/11/2011  . Arthritis   . Bradycardia 10/10/2011  . CAD (coronary artery disease) 10/10/2011   MI s/p PTCA (Dx-OM2 proximal concentric stenosis)  . CKD (chronic kidney disease) stage 3, GFR 30-59 ml/min (HCC)    [name]  . CLL (chronic lymphocytic leukemia) ([place])   . Colon polyps   . Generalized headaches    frequent  . GI bleed 07/08/2017  . Glaucoma    s/p laser surgery  . HLD (hyperlipidemia)   . Hypertension   . Hypothyroidism 10/10/2011  . Thrombocytopenia ([place]) 10/11/2011  . Type 2 diabetes with nephropathy [facility])    DM refresher course [facility] (04/2013)    Patient Active Problem List   Diagnosis Date Noted  . Cervical spondylosis 03/27/2018  . Carotid stenosis, asymptomatic, bilateral 03/26/2018  . Hyperkalemia 02/28/2018  . Iron deficiency anemia due to chronic blood loss   . Sternal fracture 09/13/2017  . Abrasion of skin 09/13/2017  . Skin rash 08/23/2017  . Pedal edema 08/23/2017  . Right knee pain 05/19/2017  . Glaucoma   . Generalized headaches   . Colon polyps   . Arthritis   . CLL (chronic  lymphocytic leukemia) ([place]) 01/18/2016  . Chronic insomnia 01/03/2016  . Diabetic neuropathy ([place]) 06/09/2015  . BPV (benign positional vertigo) 05/23/2014  . Advanced care planning/counseling discussion 01/25/2014  . Medicare annual wellness visit, subsequent 07/06/2012  . HLD (hyperlipidemia)   . CKD (chronic kidney disease) stage 3, GFR 30-59 ml/min (HCC)   . Uncontrolled type 2 diabetes mellitus with nephropathy ([place])   . Anemia in CKD (chronic kidney disease) 10/11/2011  . Idiopathic thrombocytopenic purpura (ITP) (HCC) 10/11/2011  . CAD (coronary artery disease) 10/10/2011  . Hypertension 10/10/2011  . Hypothyroidism 10/10/2011  . Bradycardia 10/10/2011  . Acquired hand deformity 03/04/1960    Past Surgical History:  Procedure Laterality Date  . BACK SURGERY     cervical neck  . CATARACT EXTRACTION, BILATERAL Bilateral 2017  . COLONOSCOPY  11/2008   1 polyp, diverticulosis, rec rpt 5 yrs (Dr. Oletta Lamas, [name])  . COLONOSCOPY  06/2014   hyperplastic polyp, rpt 5 yrs ([place])  . COLONOSCOPY WITH PROPOFOL N/A 11/17/2017   TA, HP, (Vanga, Tally Due, MD)  . ESOPHAGOGASTRODUODENOSCOPY (EGD) WITH PROPOFOL N/A 11/17/2017   healing erosive gastritis, intestinal metaplasia, neg H pylori (Vanga, Tally Due, MD)  . EYE SURGERY  2012   laser surgery for glaucoma  . PTCA  1994, 1995  . US ECHOCARDIOGRAPHY  10/2013   inferior wall hypokinesis, mild LVH, EF 50-55%, mild MR and LA dilation    Prior to Admission  medications   Medication Sig Start Date End Date Taking? Authorizing Provider  aspirin EC 81 MG tablet Take 1 tablet (81 mg total) by mouth daily. 02/01/16  Yes Dorothy Spark, MD  atorvastatin (LIPITOR) 80 MG tablet Take 80 mg by mouth daily.   Yes [provider]  carvedilol (COREG) 6.25 MG tablet Take 1 tablet (6.25 mg total) by mouth 2 (two) times daily. Please make annual appt for future refills. Thank you 05/20/18  Yes Dorothy Spark, MD  insulin aspart  (NOVOLOG) 100 UNIT/ML FlexPen Take 5 units with meals, take 10 units if mealtime sugar >250 02/27/18  Yes [name], MD  Insulin Detemir (LEVEMIR FLEXTOUCH) 100 UNIT/ML Pen INJECT 25 UNITS SUBCUTANEOUSLY ONCE DAILY AT  10PM 01/22/18  Yes [name], MD  levothyroxine (SYNTHROID, LEVOTHROID) 75 MCG tablet Take 1 tablet (75 mcg total) by mouth daily. 02/28/18  Yes [name], MD  traZODone (DESYREL) 50 MG tablet Take 1.5 tablets (75 mg total) by mouth at bedtime as needed for sleep. 03/27/18  Yes [name], MD  acetaminophen (TYLENOL) 650 MG CR tablet Take 650 mg by mouth every 8 (eight) hours as needed for pain.    [provider]  furosemide (LASIX) 20 MG tablet Take 1 tablet (20 mg total) by mouth daily. 10/10/17 04/27/18  Dorothy Spark, MD  glucose blood (ONE TOUCH ULTRA TEST) test strip 1 each by Other route 4 (four) times daily. and as needed to check sugars Dx code:  E11.21 09/12/17   [name], MD  nitroGLYCERIN (NITROSTAT) 0.4 MG SL tablet Place 1 tablet (0.4 mg total) under the tongue every 5 (five) minutes as needed for chest pain ((MAX of 3 doses)). 02/27/18   [name], MD    Allergies Patient has no known allergies.  Family History  Problem Relation Age of Onset  . Diabetes Sister   . Stomach cancer Sister 14  . Pneumonia Father        caused death  . Other Brother        no communication with brother so unsure of any health conditions  . Coronary artery disease Son 26       5v CABG and stents  . Hyperlipidemia Sister   . Stroke Neg Hx   . Heart attack Neg Hx     Social History Social History   Tobacco Use  . Smoking status: Former Smoker    Last attempt to quit: 03/04/1978    Years since quitting: 40.2  . Smokeless tobacco: Never Used  Substance Use Topics  . Alcohol use: No  . Drug use: No    Review of Systems  Constitutional: No fever. Eyes: No redness. ENT: Positive for epistaxis. Cardiovascular: Denies  chest pain. Respiratory: Denies shortness of breath. Gastrointestinal: No vomiting. Genitourinary: Negative for hematuria.  Musculoskeletal: Negative for back pain. Skin: Negative for rash. Neurological: Negative for headache.   ____________________________________________   PHYSICAL EXAM:  VITAL SIGNS: ED Triage Vitals [05/30/18 0941]  Enc Vitals Group     BP (!) 157/70     Pulse Rate 77     Resp 14     Temp 97.8 F (36.6 C)     Temp Source Oral     SpO2 98 %     Weight 155 lb (70.3 kg)     Height      Head Circumference      Peak Flow      Pain Score 0  Pain Loc      Pain Edu?      Excl. in [place]?     Constitutional: Alert and oriented.  Relatively well appearing and in no acute distress. Eyes: Conjunctivae are normal.  Head: Atraumatic. Nose: Left nare with anterior epistaxis. Mouth/Throat: Mucous membranes are moist.   Neck: Normal range of motion.  Cardiovascular: Good peripheral circulation. Respiratory: Normal respiratory effort.   Gastrointestinal:  No distention.  Musculoskeletal:  Extremities warm and well perfused.  Neurologic:  Normal speech and language. No gross focal neurologic deficits are appreciated.  Skin:  Skin is warm and dry. No rash noted. Psychiatric: Mood and affect are normal. Speech and behavior are normal.  ____________________________________________   LABS (all labs ordered are listed, but only abnormal results are displayed)  Labs Reviewed  BASIC METABOLIC PANEL - Abnormal; Notable for the following components:      Result Value   BUN 43 (*)    Creatinine, Ser 1.47 (*)    GFR calc non Af Amer 45 (*)    GFR calc Af Amer 53 (*)    All other components within normal limits  CBC WITH DIFFERENTIAL/PLATELET - Abnormal; Notable for the following components:   RBC 2.31 (*)    Hemoglobin 7.1 (*)    HCT 22.5 (*)    RDW 21.3 (*)    Platelets 22 (*)    Lymphs Abs 4.1 (*)    All other components within normal limits  PROTIME-INR   TYPE AND SCREEN  PREPARE RBC (CROSSMATCH)   ____________________________________________  EKG   ____________________________________________  RADIOLOGY    ____________________________________________   PROCEDURES  Procedure(s) performed: Yes  .Epistaxis Management Date/Time: 05/30/2018 3:34 PM Performed by: Arta Silence, MD Authorized by: Arta Silence, MD   Consent:    Consent obtained:  Verbal   Consent given by:  Patient   Risks discussed:  Bleeding and infection Procedure details:    Treatment site:  L anterior   Treatment method:  Merocel sponge   Treatment complexity:  Limited   Treatment episode: initial   Post-procedure details:    Assessment:  Bleeding stopped   Patient tolerance of procedure:  Tolerated well, no immediate complications    Critical Care performed: Yes  CRITICAL CARE Performed by: Arta Silence   Total critical care time: 40 minutes  Critical care time was exclusive of separately billable procedures and treating other patients.  Critical care was necessary to treat or prevent imminent or life-threatening deterioration.  Critical care was time spent personally by me on the following activities: development of treatment plan with patient and/or surrogate as well as nursing, discussions with consultants, evaluation of patient's response to treatment, examination of patient, obtaining history from patient or surrogate, ordering and performing treatments and interventions, ordering and review of laboratory studies, ordering and review of radiographic studies, pulse oximetry and re-evaluation of patient's condition. ____________________________________________   INITIAL IMPRESSION / ASSESSMENT AND PLAN / ED COURSE  Pertinent labs & imaging results that were available during my care of the patient were reviewed by me and considered in my medical decision making (see chart for details).  78 year old male with history of  CLL and thrombocytopenia presents with epistaxis for the last 2 hours.  The patient put on a plastic clip which he received during a prior hospital visit, but has not had resolution of the nosebleed.  I reviewed the past medical records in [place].  The patient is followed by hematology here and was last seen 5 days  ago.  He has CLL and chronic anemia.  He also has thrombocytopenia thought to be caused by to ITP.  He received a an Nplate infusion during the visit 5 days ago.  On exam the patient is overall relatively well-appearing and his vital signs are normal except for hypertension.  He has blood in the left nare and the epistaxis appears to be relatively anterior.  The remainder of the exam is unremarkable.  We will administer Afrin and continue pressure.  If this does not resolve the bleeding, I will place packing.  We will also obtain labs to see if the patient has had any change in his platelets or hemoglobin.  ----------------------------------------- 3:35 PM on 05/30/2018 -----------------------------------------  The patient still had some bleeding after the Afrin, so I placed nasal packing in the left nare with [place].  The bleeding has now been controlled for the last several hours.  Lab work-up is reassuring except for platelets of 22 which are lower than they have been in the last several weeks.  I consulted Dr. Mike Gip from hematology, and she discussed the case with the patient's hematologist Dr. [name].  She recommends that we transfuse the patient RBCs in order to improve his anemia and give him more reserve in case there is any further bleeding related to his low platelets.  They advised that he should follow-up with them in the cancer center on Monday (in 2 days) for a recheck and possible further treatment at that time, and that he should return to the hospital in the meantime if he has any recurrent bleeding.  I discussed this plan of care with the patient as well as with  his son [name] over the phone and they both expressed agreement.  The patient is receiving a transfusion at this time.  We will observe him afterwards and then discharged home if no further bleeding.  I am signing the patient out to the oncoming physician Dr. Alfred Levins.  ____________________________________________   FINAL CLINICAL IMPRESSION(S) / ED DIAGNOSES  Final diagnoses:  Epistaxis  Thrombocytopenia ([place])      NEW MEDICATIONS STARTED DURING THIS VISIT:  New Prescriptions   No medications on file     Note:  This document was prepared using Dragon voice recognition software and may include unintentional dictation errors.    Arta Silence, MD 05/30/18 1537

## 2018-05-30 NOTE — Discharge Instructions (Addendum)
Follow-up with Dr. [name] at the cancer center on Monday.  Keep the packing in place in the left nostril until you follow-up on Monday.  It must be removed at that time (it cannot stay in longer than 2 to 3 days).  Take the antibiotic as prescribed until you follow-up.  Return to the ER IMMEDIATELY for new, worsening, or persistent bleeding or any other new or worsening symptoms that concern you.

## 2018-05-30 NOTE — ED Notes (Signed)
AAOx3.  Skin warm and dry.  NAD 

## 2018-05-30 NOTE — ED Triage Notes (Signed)
Pt presents via POV c/o epistaxis x2 hours PTA. Pt reports gets blood products at cancer center. Bleeding controlled at this time.

## 2018-05-30 NOTE — ED Provider Notes (Signed)
-----------------------------------------   3:29 PM on 05/30/2018 -----------------------------------------   Blood pressure (!) 145/68, pulse 84, temperature 98.1 F (36.7 C), temperature source Oral, resp. rate 16, weight 70.3 kg, SpO2 98 %.  Assuming care from Dr. Cherylann Banas of [facility] is a 78 y.o. male with a chief complaint of Epistaxis .    Please refer to H&P by previous MD for further details.  The current plan of care is to discharge after blood transfusion.  _________________________ 6:09 PM on 05/30/2018 -----------------------------------------  Patient received blood. Monitored for 30 min with no complications. Will dc home per Dr. Kennith Gain plan with f/u with his hematologist   Alfred Levins, [place], MD 05/30/18 1810

## 2018-05-31 LAB — PREPARE RBC (CROSSMATCH)

## 2018-06-01 ENCOUNTER — Other Ambulatory Visit: Payer: Self-pay

## 2018-06-01 ENCOUNTER — Emergency Department
Admission: EM | Admit: 2018-06-01 | Discharge: 2018-06-01 | Disposition: A | Discharge status: Home / Self Care | Payer: Medicare Other | Attending: Emergency Medicine | Admitting: Emergency Medicine

## 2018-06-01 ENCOUNTER — Ambulatory Visit: Payer: Medicare Other

## 2018-06-01 ENCOUNTER — Encounter: Payer: Self-pay | Admitting: Emergency Medicine

## 2018-06-01 ENCOUNTER — Inpatient Hospital Stay: Payer: Medicare Other

## 2018-06-01 DIAGNOSIS — D631 Anemia in chronic kidney disease: Secondary | ICD-10-CM | POA: Diagnosis not present

## 2018-06-01 DIAGNOSIS — Z4801 Encounter for change or removal of surgical wound dressing: Secondary | ICD-10-CM | POA: Diagnosis not present

## 2018-06-01 DIAGNOSIS — C9110 Chronic lymphocytic leukemia of B-cell type not having achieved remission: Secondary | ICD-10-CM

## 2018-06-01 DIAGNOSIS — Z4800 Encounter for change or removal of nonsurgical wound dressing: Secondary | ICD-10-CM | POA: Diagnosis not present

## 2018-06-01 DIAGNOSIS — Z87891 Personal history of nicotine dependence: Secondary | ICD-10-CM | POA: Diagnosis not present

## 2018-06-01 DIAGNOSIS — E119 Type 2 diabetes mellitus without complications: Secondary | ICD-10-CM | POA: Diagnosis not present

## 2018-06-01 DIAGNOSIS — E1122 Type 2 diabetes mellitus with diabetic chronic kidney disease: Secondary | ICD-10-CM | POA: Diagnosis not present

## 2018-06-01 DIAGNOSIS — E039 Hypothyroidism, unspecified: Secondary | ICD-10-CM | POA: Insufficient documentation

## 2018-06-01 DIAGNOSIS — I6523 Occlusion and stenosis of bilateral carotid arteries: Secondary | ICD-10-CM | POA: Diagnosis not present

## 2018-06-01 DIAGNOSIS — I129 Hypertensive chronic kidney disease with stage 1 through stage 4 chronic kidney disease, or unspecified chronic kidney disease: Secondary | ICD-10-CM | POA: Insufficient documentation

## 2018-06-01 DIAGNOSIS — D696 Thrombocytopenia, unspecified: Secondary | ICD-10-CM | POA: Diagnosis not present

## 2018-06-01 DIAGNOSIS — N183 Chronic kidney disease, stage 3 (moderate): Secondary | ICD-10-CM | POA: Insufficient documentation

## 2018-06-01 DIAGNOSIS — I2510 Atherosclerotic heart disease of native coronary artery without angina pectoris: Secondary | ICD-10-CM | POA: Diagnosis not present

## 2018-06-01 DIAGNOSIS — D649 Anemia, unspecified: Secondary | ICD-10-CM

## 2018-06-01 DIAGNOSIS — D693 Immune thrombocytopenic purpura: Secondary | ICD-10-CM

## 2018-06-01 LAB — TYPE AND SCREEN
ABO/RH(D): A POS
Antibody Screen: NEGATIVE
DONOR AG TYPE: NEGATIVE
Unit division: 0
Unit division: 0
Unit division: 0
Unit division: 0

## 2018-06-01 LAB — BPAM RBC
Blood Product Expiration Date: 202004092359
Blood Product Expiration Date: 202004092359
Blood Product Expiration Date: 202004102359
Blood Product Expiration Date: 202004122359
ISSUE DATE / TIME: 202003281449
Unit Type and Rh: 600
Unit Type and Rh: 600
Unit Type and Rh: 6200
Unit Type and Rh: 6200

## 2018-06-01 LAB — CBC WITH DIFFERENTIAL/PLATELET
Abs Immature Granulocytes: 0.06 10*3/uL (ref 0.00–0.07)
Basophils Absolute: 0 10*3/uL (ref 0.0–0.1)
Basophils Relative: 1 %
Eosinophils Absolute: 0.2 10*3/uL (ref 0.0–0.5)
Eosinophils Relative: 2 %
HCT: 24.8 % — ABNORMAL LOW (ref 39.0–52.0)
Hemoglobin: 8.1 g/dL — ABNORMAL LOW (ref 13.0–17.0)
Immature Granulocytes: 1 %
LYMPHS PCT: 66 %
Lymphs Abs: 5.1 10*3/uL — ABNORMAL HIGH (ref 0.7–4.0)
MCH: 30.6 pg (ref 26.0–34.0)
MCHC: 32.7 g/dL (ref 30.0–36.0)
MCV: 93.6 fL (ref 80.0–100.0)
Monocytes Absolute: 0.3 10*3/uL (ref 0.1–1.0)
Monocytes Relative: 5 %
NEUTROS PCT: 25 %
Neutro Abs: 1.9 10*3/uL (ref 1.7–7.7)
Platelets: 26 10*3/uL — CL (ref 150–400)
RBC: 2.65 MIL/uL — ABNORMAL LOW (ref 4.22–5.81)
RDW: 20.7 % — ABNORMAL HIGH (ref 11.5–15.5)
Smear Review: NORMAL
WBC MORPHOLOGY: ABNORMAL
WBC: 7.6 10*3/uL (ref 4.0–10.5)
nRBC: 0.3 % — ABNORMAL HIGH (ref 0.0–0.2)

## 2018-06-01 LAB — SAMPLE TO BLOOD BANK

## 2018-06-01 MED ORDER — OXYMETAZOLINE HCL 0.05 % NA SOLN
1.0000 | Freq: Once | NASAL | Status: AC
  Administered 2018-06-01: 1 via NASAL
  Filled 2018-06-01: qty 30

## 2018-06-01 MED ORDER — ROMIPLOSTIM 250 MCG ~~LOC~~ SOLR
3.0000 ug/kg | Freq: Once | SUBCUTANEOUS | Status: AC
  Administered 2018-06-01: 210 ug via SUBCUTANEOUS
  Filled 2018-06-01: qty 0.42

## 2018-06-01 NOTE — ED Provider Notes (Signed)
[facility] Emergency Department Provider Note       Time seen: ----------------------------------------- 8:38 AM on 06/01/2018 -----------------------------------------   I have reviewed the triage vital signs and the nursing notes.  HISTORY   Chief Complaint Wound Check    HPI Scott [name] is a 78 y.o. male with a history of anemia, coronary artery disease, chronic kidney disease, GI bleeding, hypertension, thrombocytopenia who presents to the ED for nasal packing removal.  Patient reports she had nasal packing 2 or 3 days ago and states he has had tremendous pain from the packing.  He is requesting removal.  Past Medical History:  Diagnosis Date  . Acquired hand deformity 1962   hand saw accident at work  . Anemia 10/11/2011  . Arthritis   . Bradycardia 10/10/2011  . CAD (coronary artery disease) 10/10/2011   MI s/p PTCA (Dx-OM2 proximal concentric stenosis)  . CKD (chronic kidney disease) stage 3, GFR 30-59 ml/min (HCC)    Scott [name]  . CLL (chronic lymphocytic leukemia) ([place])   . Colon polyps   . Generalized headaches    frequent  . GI bleed 07/08/2017  . Glaucoma    s/p laser surgery  . HLD (hyperlipidemia)   . Hypertension   . Hypothyroidism 10/10/2011  . Thrombocytopenia ([place]) 10/11/2011  . Type 2 diabetes with nephropathy [facility])    DM refresher course [facility] (04/2013)    Patient Active Problem List   Diagnosis Date Noted  . Cervical spondylosis 03/27/2018  . Carotid stenosis, asymptomatic, bilateral 03/26/2018  . Hyperkalemia 02/28/2018  . Iron deficiency anemia due to chronic blood loss   . Sternal fracture 09/13/2017  . Abrasion of skin 09/13/2017  . Skin rash 08/23/2017  . Pedal edema 08/23/2017  . Right knee pain 05/19/2017  . Glaucoma   . Generalized headaches   . Colon polyps   . Arthritis   . CLL (chronic lymphocytic leukemia) ([place]) 01/18/2016  . Chronic insomnia 01/03/2016  . Diabetic neuropathy ([place]) 06/09/2015  . BPV (benign  positional vertigo) 05/23/2014  . Advanced care planning/counseling discussion 01/25/2014  . Medicare annual wellness visit, subsequent 07/06/2012  . HLD (hyperlipidemia)   . CKD (chronic kidney disease) stage 3, GFR 30-59 ml/min (HCC)   . Uncontrolled type 2 diabetes mellitus with nephropathy ([place])   . Anemia in CKD (chronic kidney disease) 10/11/2011  . Idiopathic thrombocytopenic purpura (ITP) (HCC) 10/11/2011  . CAD (coronary artery disease) 10/10/2011  . Hypertension 10/10/2011  . Hypothyroidism 10/10/2011  . Bradycardia 10/10/2011  . Acquired hand deformity 03/04/1960    Past Surgical History:  Procedure Laterality Date  . BACK SURGERY     cervical neck  . CATARACT EXTRACTION, BILATERAL Bilateral 2017  . COLONOSCOPY  11/2008   1 polyp, diverticulosis, rec rpt 5 yrs (Dr. Oletta Lamas, [name])  . COLONOSCOPY  06/2014   hyperplastic polyp, rpt 5 yrs ([place])  . COLONOSCOPY WITH PROPOFOL N/A 11/17/2017   TA, HP, (Vanga, Tally Due, MD)  . ESOPHAGOGASTRODUODENOSCOPY (EGD) WITH PROPOFOL N/A 11/17/2017   healing erosive gastritis, intestinal metaplasia, neg H pylori (Vanga, Tally Due, MD)  . EYE SURGERY  2012   laser surgery for glaucoma  . PTCA  1994, 1995  . US ECHOCARDIOGRAPHY  10/2013   inferior wall hypokinesis, mild LVH, EF 50-55%, mild MR and LA dilation    Allergies Patient has no known allergies.  Social History Social History   Tobacco Use  . Smoking status: Former Smoker    Last attempt to quit: 03/04/1978  Years since quitting: 40.2  . Smokeless tobacco: Never Used  Substance Use Topics  . Alcohol use: No  . Drug use: No   Review of Systems Constitutional: Negative for fever. ENT: Positive for left sided nasal packing Skin: Negative for rash. Neurological: Negative for headaches, focal weakness or numbness.  All systems negative/normal/unremarkable except as stated in the HPI  ____________________________________________   PHYSICAL EXAM:  VITAL  SIGNS: ED Triage Vitals  Enc Vitals Group     BP 06/01/18 0806 (!) 126/58     Pulse Rate 06/01/18 0806 71     Resp 06/01/18 0806 16     Temp --      Temp src --      SpO2 06/01/18 0806 97 %     Weight 06/01/18 0804 154 lb 15.7 oz (70.3 kg)     Height 06/01/18 0804 5\' 11"  (1.803 m)     Head Circumference --      Peak Flow --      Pain Score 06/01/18 0804 0     Pain Loc --      Pain Edu? --      Excl. in [place]? --    Constitutional: Chronically ill-appearing, no distress ENT      Head: Normocephalic and atraumatic.      Nose: No congestion/rhinnorhea.Left-sided Merocel sponges present, no obvious bleeding or blood soaking of the packing.      Mouth/Throat: No blood in the posterior pharynx Musculoskeletal: Nontender with normal range of motion in extremities. No lower extremity tenderness nor edema. Neurologic:  Normal speech and language. No gross focal neurologic deficits are appreciated.  Skin:  Skin is warm, dry and intact. No rash noted. Psychiatric: Mood and affect are normal.  ____________________________________________  ED COURSE:  As part of my medical decision making, I reviewed the following data within the [place] History obtained from family if available, nursing notes, old chart and ekg, as well as notes from prior ED visits. Patient presented for nasal packing removal, we will assess with labs and imaging as indicated at this time.   Procedures Scott [name] was evaluated in Emergency Department on 06/01/2018 for the symptoms described in the history of present illness. He was evaluated in the context of the global COVID-19 pandemic, which necessitated consideration that the patient might be at risk for infection with the SARS-CoV-2 virus that causes COVID-19. Institutional protocols and algorithms that pertain to the evaluation of patients at risk for COVID-19 are in a state of rapid change based on information released by regulatory bodies including  the CDC and federal and state organizations. These policies and algorithms were followed during the patient's care in the ED.  ____________________________________________   DIFFERENTIAL DIAGNOSIS   Nasal packing removal, epistaxis, thrombocytopenia  FINAL ASSESSMENT AND PLAN  Nasal packing removal   Plan: The patient had presented for nasal packing removal.  Remove the packing and applied some Afrin nasal spray.  He has not had any significant bleeding.  I do see some blood on 1 of the turbinates but no active hemorrhage.  He is cleared for outpatient follow-up with ENT as needed   Laurence Aly, MD    Note: This note was generated in part or whole with voice recognition software. Voice recognition is usually quite accurate but there are transcription errors that can and very often do occur. I apologize for any typographical errors that were not detected and corrected.     Earleen Newport,  MD 06/01/18 0840

## 2018-06-01 NOTE — ED Notes (Signed)
Packing removed from left nare by MD. Will continue to monitor for bleeding.

## 2018-06-01 NOTE — ED Notes (Signed)
Pt alert and oriented X4, active, cooperative, pt in NAD. RR even and unlabored, color WNL.  Pt informed to return if any life threatening symptoms occur.  Discharge and followup instructions reviewed. No bleeding noted.

## 2018-06-01 NOTE — ED Triage Notes (Signed)
Presents for nasal packing removal

## 2018-06-03 ENCOUNTER — Telehealth: Payer: Self-pay | Admitting: Emergency Medicine

## 2018-06-03 NOTE — Telephone Encounter (Signed)
Pt called to ER regarding Afrin Nasal spray and whether or not he should be using it. This RN reviewed chart, Dr. Jimmye Norman saw patient on 3/30. This RN able to consult Dr. Jimmye Norman regarding patient question, pt instructed per Dr. Jimmye Norman to use Afrin Nasal spray BID x 3 days then discontinue. Pt states understanding.

## 2018-06-05 ENCOUNTER — Other Ambulatory Visit: Payer: Self-pay

## 2018-06-05 ENCOUNTER — Inpatient Hospital Stay: Payer: Medicare Other | Attending: Oncology

## 2018-06-05 ENCOUNTER — Encounter: Payer: Self-pay | Admitting: Family Medicine

## 2018-06-05 DIAGNOSIS — E1122 Type 2 diabetes mellitus with diabetic chronic kidney disease: Secondary | ICD-10-CM | POA: Diagnosis not present

## 2018-06-05 DIAGNOSIS — I6523 Occlusion and stenosis of bilateral carotid arteries: Secondary | ICD-10-CM | POA: Insufficient documentation

## 2018-06-05 DIAGNOSIS — E039 Hypothyroidism, unspecified: Secondary | ICD-10-CM | POA: Insufficient documentation

## 2018-06-05 DIAGNOSIS — D649 Anemia, unspecified: Secondary | ICD-10-CM

## 2018-06-05 DIAGNOSIS — D693 Immune thrombocytopenic purpura: Secondary | ICD-10-CM | POA: Insufficient documentation

## 2018-06-05 DIAGNOSIS — I129 Hypertensive chronic kidney disease with stage 1 through stage 4 chronic kidney disease, or unspecified chronic kidney disease: Secondary | ICD-10-CM | POA: Insufficient documentation

## 2018-06-05 DIAGNOSIS — D696 Thrombocytopenia, unspecified: Secondary | ICD-10-CM | POA: Diagnosis not present

## 2018-06-05 DIAGNOSIS — D631 Anemia in chronic kidney disease: Secondary | ICD-10-CM | POA: Diagnosis not present

## 2018-06-05 DIAGNOSIS — N183 Chronic kidney disease, stage 3 (moderate): Secondary | ICD-10-CM | POA: Diagnosis not present

## 2018-06-05 DIAGNOSIS — Z87891 Personal history of nicotine dependence: Secondary | ICD-10-CM | POA: Diagnosis not present

## 2018-06-05 DIAGNOSIS — F5104 Psychophysiologic insomnia: Secondary | ICD-10-CM

## 2018-06-05 DIAGNOSIS — C9110 Chronic lymphocytic leukemia of B-cell type not having achieved remission: Secondary | ICD-10-CM

## 2018-06-05 LAB — CBC WITH DIFFERENTIAL/PLATELET
Abs Immature Granulocytes: 0 10*3/uL (ref 0.00–0.07)
Band Neutrophils: 1 %
Basophils Absolute: 0.1 10*3/uL (ref 0.0–0.1)
Basophils Relative: 2 %
Eosinophils Absolute: 0.1 10*3/uL (ref 0.0–0.5)
Eosinophils Relative: 2 %
HCT: 23.7 % — ABNORMAL LOW (ref 39.0–52.0)
Hemoglobin: 7.7 g/dL — ABNORMAL LOW (ref 13.0–17.0)
Lymphocytes Relative: 62 %
Lymphs Abs: 3.9 10*3/uL (ref 0.7–4.0)
MCH: 30.9 pg (ref 26.0–34.0)
MCHC: 32.5 g/dL (ref 30.0–36.0)
MCV: 95.2 fL (ref 80.0–100.0)
Monocytes Absolute: 0.3 10*3/uL (ref 0.1–1.0)
Monocytes Relative: 5 %
Neutro Abs: 1.8 10*3/uL (ref 1.7–7.7)
Neutrophils Relative %: 28 %
Platelets: 30 10*3/uL — ABNORMAL LOW (ref 150–400)
RBC: 2.49 MIL/uL — ABNORMAL LOW (ref 4.22–5.81)
RDW: 19.8 % — ABNORMAL HIGH (ref 11.5–15.5)
Smear Review: NORMAL
WBC Morphology: ABNORMAL
WBC: 6.3 10*3/uL (ref 4.0–10.5)
nRBC: 0 % (ref 0.0–0.2)

## 2018-06-06 LAB — SAMPLE TO BLOOD BANK

## 2018-06-07 NOTE — Progress Notes (Signed)
[place]  [phone] [phone]) [phone]  ID: Scott [name] OB: [birth year]  MR#: [id number]  TIR#:[id number]  Patient Care Team: [name], MD as PCP - General (Family Medicine) Lloyd Huger, MD as Consulting Physician (Oncology) Clent Jacks, MD as Consulting Physician (Ophthalmology) Dorothy Spark, MD as Consulting Physician (Cardiology) Madelon Lips, MD as Consulting Physician (Nephrology)  CHIEF COMPLAINT: 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  INTERVAL HISTORY: Patient returns to clinic today for repeat laboratory, further evaluation, and continuation of Nplate and Procrit.  He was seen in the emergency room approximately 10 days ago with a nosebleed that resolved with packing.  He also received 1 unit of packed red blood cells at that point.  He has chronic weakness and fatigue, but otherwise feels well.  He continues to have easy bruising.  He denies any dizziness or other neurologic complaints. He has a good appetite and denies weight loss. He denies any night sweats. He has noted no lymphadenopathy.  He has no chest pain, shortness of breath, cough, or hemoptysis.  He denies any nausea, vomiting, constipation, or diarrhea. He has no urinary complaints.  Patient offers no further specific complaints today.  REVIEW OF SYSTEMS:   Review of Systems  Constitutional: Positive for malaise/fatigue. Negative for fever and weight loss.  Respiratory: Negative.  Negative for cough and shortness of breath.   Cardiovascular: Negative.  Negative for chest pain and leg swelling.  Gastrointestinal: Negative.  Negative for abdominal pain, blood in stool and melena.  Genitourinary: Negative.  Negative for dysuria and hematuria.  Musculoskeletal: Negative.  Negative for back pain and neck pain.  Skin: Negative.  Negative for itching and rash.  Neurological: Positive for weakness. Negative for dizziness, sensory change, focal weakness  and headaches.  Endo/Heme/Allergies: Bruises/bleeds easily.  Psychiatric/Behavioral: Negative.  The patient is not nervous/anxious.     As per HPI. Otherwise, a complete review of systems is negative.  PAST MEDICAL HISTORY: Past Medical History:  Diagnosis Date  . Acquired hand deformity 1962   hand saw accident at work  . Anemia 10/11/2011  . Arthritis   . Bradycardia 10/10/2011  . CAD (coronary artery disease) 10/10/2011   MI s/p PTCA (Dx-OM2 proximal concentric stenosis)  . CKD (chronic kidney disease) stage 3, GFR 30-59 ml/min (HCC)    [name]  . CLL (chronic lymphocytic leukemia) ([place])   . Colon polyps   . Generalized headaches    frequent  . GI bleed 07/08/2017  . Glaucoma    s/p laser surgery  . HLD (hyperlipidemia)   . Hypertension   . Hypothyroidism 10/10/2011  . Thrombocytopenia ([place]) 10/11/2011  . Type 2 diabetes with nephropathy [facility])    DM refresher course [facility] (04/2013)    PAST SURGICAL HISTORY: Past Surgical History:  Procedure Laterality Date  . BACK SURGERY     cervical neck  . CATARACT EXTRACTION, BILATERAL Bilateral 2017  . COLONOSCOPY  11/2008   1 polyp, diverticulosis, rec rpt 5 yrs (Dr. Oletta Lamas, [name])  . COLONOSCOPY  06/2014   hyperplastic polyp, rpt 5 yrs ([place])  . COLONOSCOPY WITH PROPOFOL N/A 11/17/2017   TA, HP, (Vanga, Tally Due, MD)  . ESOPHAGOGASTRODUODENOSCOPY (EGD) WITH PROPOFOL N/A 11/17/2017   healing erosive gastritis, intestinal metaplasia, neg H pylori (Vanga, Tally Due, MD)  . EYE SURGERY  2012   laser surgery for glaucoma  . PTCA  1994, 1995  . US ECHOCARDIOGRAPHY  10/2013   inferior wall hypokinesis, mild LVH, EF  50-55%, mild MR and LA dilation    FAMILY HISTORY: Family History  Problem Relation Age of Onset  . Diabetes Sister   . Stomach cancer Sister 65  . Pneumonia Father        caused death  . Other Brother        no communication with brother so unsure of any health conditions  . Coronary artery disease Son 82        5v CABG and stents  . Hyperlipidemia Sister   . Stroke Neg Hx   . Heart attack Neg Hx     ADVANCED DIRECTIVES (Y/N):  N  HEALTH MAINTENANCE: Social History   Tobacco Use  . Smoking status: Former Smoker    Last attempt to quit: 03/04/1978    Years since quitting: 40.2  . Smokeless tobacco: Never Used  Substance Use Topics  . Alcohol use: No  . Drug use: No     Colonoscopy:  PAP:  Bone density:  Lipid panel:  No Known Allergies  Current Outpatient Medications  Medication Sig Dispense Refill  . acetaminophen (TYLENOL) 650 MG CR tablet Take 650 mg by mouth every 8 (eight) hours as needed for pain.    Marland Kitchen aspirin EC 81 MG tablet Take 1 tablet (81 mg total) by mouth daily. 90 tablet 3  . atorvastatin (LIPITOR) 80 MG tablet Take 80 mg by mouth daily.    . carvedilol (COREG) 6.25 MG tablet Take 1 tablet (6.25 mg total) by mouth 2 (two) times daily. Please make annual appt for future refills. Thank you 180 tablet 0  . glucose blood (ONE TOUCH ULTRA TEST) test strip 1 each by Other route 4 (four) times daily. and as needed to check sugars Dx code:  E11.21 400 each 3  . insulin aspart (NOVOLOG) 100 UNIT/ML FlexPen Take 5 units with meals, take 10 units if mealtime sugar >250 15 mL 3  . Insulin Detemir (LEVEMIR FLEXTOUCH) 100 UNIT/ML Pen INJECT 25 UNITS SUBCUTANEOUSLY ONCE DAILY AT  10PM 15 pen 3  . levothyroxine (SYNTHROID, LEVOTHROID) 75 MCG tablet Take 1 tablet (75 mcg total) by mouth daily. 90 tablet 1  . nitroGLYCERIN (NITROSTAT) 0.4 MG SL tablet Place 1 tablet (0.4 mg total) under the tongue every 5 (five) minutes as needed for chest pain ((MAX of 3 doses)). 25 tablet 0  . traZODone (DESYREL) 50 MG tablet Take 1.5 tablets (75 mg total) by mouth at bedtime as needed for sleep.    . furosemide (LASIX) 20 MG tablet Take 1 tablet (20 mg total) by mouth daily. 30 tablet 4   No current facility-administered medications for this visit.     OBJECTIVE: Vitals:   06/08/18 0915  BP:  131/70  Pulse: 61  Resp: 18  Temp: (!) 96.5 F (35.8 C)     Body mass index is 21 kg/m.    ECOG FS:0 - Asymptomatic  General: Well-developed, well-nourished, no acute distress. Eyes: Pink conjunctiva, anicteric sclera. HEENT: Normocephalic, moist mucous membranes. Lungs: Clear to auscultation bilaterally. Heart: Regular rate and rhythm. No rubs, murmurs, or gallops. Abdomen: Soft, nontender, nondistended. No organomegaly noted, normoactive bowel sounds. Musculoskeletal: No edema, cyanosis, or clubbing. Neuro: Alert, answering all questions appropriately. Cranial nerves grossly intact. Skin: No rashes or petechiae noted. Psych: Normal affect.  LAB RESULTS:  Lab Results  Component Value Date   NA 144 05/30/2018   K 4.3 05/30/2018   CL 109 05/30/2018   CO2 26 05/30/2018   GLUCOSE 88 05/30/2018  BUN 43 (H) 05/30/2018   CREATININE 1.47 (H) 05/30/2018   CALCIUM 9.5 05/30/2018   PROT 6.3 (L) 04/29/2018   ALBUMIN 3.5 04/29/2018   AST 28 04/29/2018   ALT 16 04/29/2018   ALKPHOS 100 04/29/2018   BILITOT 1.1 04/29/2018   GFRNONAA 45 (L) 05/30/2018   GFRAA 53 (L) 05/30/2018    Lab Results  Component Value Date   WBC 6.3 06/05/2018   NEUTROABS 1.8 06/05/2018   HGB 7.7 (L) 06/05/2018   HCT 23.7 (L) 06/05/2018   MCV 95.2 06/05/2018   PLT 30 (L) 06/05/2018     STUDIES: No results found.  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  PLAN:    1. CLL: Bone marrow biopsy completed on October 23, 2017 reviewed independently with involvement with CLL, but unclear percentage.  Although patient was reported to have normal cytogenetics in his bone marrow sample, peripheral blood FISH testing 50% incidence of mutation in the ATM gene which is commonly associated with deletion 11q. 11q- is associated with an unfavorable prognosis and high risk of not responding to initial treatment. 13q- is a more common mutation and is actually associated with a more favorable  prognosis. Patient's white blood cell count continues to be within normal limits.  He received his last infusion of single agent Rituxan on September 03, 2017. If patient requires additional treatment could possibly transition him to [place].   2.  Thrombocytopenia: Patient's platelet count is decreased, but stable at 30. 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  Patient has received high-dose prednisone, IVIG, and Rituxan with no appreciable durability to improve his platelet count.  Proceed with 3 mcg/kg Nplate today.  If no significant improvement, will increase his dose to 4 mcg/kg.  Can also consider having patient return weekly for injections, but want to limit his exposure during the COVID-19 outbreak, therefore patient will return to clinic in 2 weeks with repeat laboratory work and further evaluation.  3.  Anemia: Patient's hemoglobin improved with blood transfusion in the emergency room and is now 7.7.  Patient has no evidence of hemolysis and his iron stores are within normal limits.  He does not require transfusion today, but will continue with 40,000 units Procrit.  He previously reported black tarry stools, but none recently.  Patient had colonoscopy on November 17, 2017 that removed 2 nonmalignant polyps.  EGD on the same day revealed nonbleeding erosive gastropathy with multiple nonbleeding duodenal ulcers.  Consider referral back to GI.  Return to clinic in 2 weeks as above.  We also discussed the possibility of repeating a bone marrow biopsy once the COVID-19 pandemic resolves.  4.  Reaction to Rituxan: Rate-based.  Patient will require premedications for any further infusions with Rituxan.    I spent a total of 30 minutes face-to-face with the patient of which greater than 50% of the visit was spent in counseling and coordination of care as detailed above.  Patient expressed understanding and was in agreement with this plan. He also understands that He can call clinic at any time with any  questions, concerns, or complaints.    Lloyd Huger, MD   06/08/2018 3:36 PM

## 2018-06-08 ENCOUNTER — Inpatient Hospital Stay (HOSPITAL_BASED_OUTPATIENT_CLINIC_OR_DEPARTMENT_OTHER): Payer: Medicare Other | Admitting: Oncology

## 2018-06-08 ENCOUNTER — Other Ambulatory Visit: Payer: Self-pay

## 2018-06-08 ENCOUNTER — Inpatient Hospital Stay: Payer: Medicare Other

## 2018-06-08 ENCOUNTER — Encounter: Payer: Self-pay | Admitting: Oncology

## 2018-06-08 VITALS — BP 131/70 | HR 61 | Temp 96.5°F | Resp 18 | Wt 150.6 lb

## 2018-06-08 DIAGNOSIS — D631 Anemia in chronic kidney disease: Secondary | ICD-10-CM | POA: Diagnosis not present

## 2018-06-08 DIAGNOSIS — D693 Immune thrombocytopenic purpura: Secondary | ICD-10-CM | POA: Diagnosis not present

## 2018-06-08 DIAGNOSIS — M1990 Unspecified osteoarthritis, unspecified site: Secondary | ICD-10-CM

## 2018-06-08 DIAGNOSIS — D696 Thrombocytopenia, unspecified: Secondary | ICD-10-CM | POA: Diagnosis not present

## 2018-06-08 DIAGNOSIS — I6523 Occlusion and stenosis of bilateral carotid arteries: Secondary | ICD-10-CM | POA: Diagnosis not present

## 2018-06-08 DIAGNOSIS — Z87891 Personal history of nicotine dependence: Secondary | ICD-10-CM

## 2018-06-08 DIAGNOSIS — N183 Chronic kidney disease, stage 3 (moderate): Secondary | ICD-10-CM

## 2018-06-08 DIAGNOSIS — D649 Anemia, unspecified: Secondary | ICD-10-CM | POA: Diagnosis not present

## 2018-06-08 DIAGNOSIS — C9110 Chronic lymphocytic leukemia of B-cell type not having achieved remission: Secondary | ICD-10-CM | POA: Diagnosis not present

## 2018-06-08 MED ORDER — EPOETIN ALFA 40000 UNIT/ML IJ SOLN
40000.0000 [IU] | Freq: Once | INTRAMUSCULAR | Status: AC
  Administered 2018-06-08: 40000 [IU] via SUBCUTANEOUS
  Filled 2018-06-08: qty 1

## 2018-06-08 MED ORDER — ROMIPLOSTIM 250 MCG ~~LOC~~ SOLR
3.0000 ug/kg | Freq: Once | SUBCUTANEOUS | Status: AC
  Administered 2018-06-08: 205 ug via SUBCUTANEOUS
  Filled 2018-06-08: qty 0.41

## 2018-06-08 NOTE — Progress Notes (Signed)
Patient denies any concerns today.  

## 2018-06-09 MED ORDER — SUVOREXANT 5 MG PO TABS: 1.0000 | ORAL_TABLET | Freq: Every evening | ORAL | 0 refills | Status: DC | PRN

## 2018-06-10 ENCOUNTER — Encounter: Payer: Self-pay | Admitting: Family Medicine

## 2018-06-11 ENCOUNTER — Encounter: Payer: Self-pay | Admitting: Family Medicine

## 2018-06-11 ENCOUNTER — Ambulatory Visit (INDEPENDENT_AMBULATORY_CARE_PROVIDER_SITE_OTHER): Payer: Medicare Other | Admitting: Family Medicine

## 2018-06-11 VITALS — BP 131/58 | HR 73 | Ht 71.5 in | Wt 150.0 lb

## 2018-06-11 DIAGNOSIS — D693 Immune thrombocytopenic purpura: Secondary | ICD-10-CM

## 2018-06-11 DIAGNOSIS — N183 Chronic kidney disease, stage 3 unspecified: Secondary | ICD-10-CM

## 2018-06-11 DIAGNOSIS — E1165 Type 2 diabetes mellitus with hyperglycemia: Secondary | ICD-10-CM | POA: Diagnosis not present

## 2018-06-11 DIAGNOSIS — E1121 Type 2 diabetes mellitus with diabetic nephropathy: Secondary | ICD-10-CM | POA: Diagnosis not present

## 2018-06-11 DIAGNOSIS — IMO0002 Reserved for concepts with insufficient information to code with codable children: Secondary | ICD-10-CM

## 2018-06-11 DIAGNOSIS — D631 Anemia in chronic kidney disease: Secondary | ICD-10-CM

## 2018-06-11 DIAGNOSIS — I6523 Occlusion and stenosis of bilateral carotid arteries: Secondary | ICD-10-CM | POA: Diagnosis not present

## 2018-06-11 DIAGNOSIS — C9110 Chronic lymphocytic leukemia of B-cell type not having achieved remission: Secondary | ICD-10-CM | POA: Diagnosis not present

## 2018-06-11 DIAGNOSIS — F5104 Psychophysiologic insomnia: Secondary | ICD-10-CM

## 2018-06-11 MED ORDER — INSULIN ASPART 100 UNIT/ML FLEXPEN: PEN_INJECTOR | SUBCUTANEOUS | 11 refills | Status: DC

## 2018-06-11 MED ORDER — TRAZODONE HCL 100 MG PO TABS: 100.0000 mg | ORAL_TABLET | Freq: Every evening | ORAL | 1 refills | Status: DC | PRN

## 2018-06-11 NOTE — Telephone Encounter (Signed)
plz call pt to schedule a virtual visit.

## 2018-06-11 NOTE — Assessment & Plan Note (Signed)
Longstanding sleep initiation and maintenance insomnia. Reviewed options. Discussed OTC remedies (melatonin).  He actually states trazodone 100mg  has been effective but he stopped as he ran out of medication. Will refill trazodone at 100mg  nightly.  Sleep hygiene reviewed. Handout printed to be mailed to patient.

## 2018-06-11 NOTE — Assessment & Plan Note (Signed)
Upcoming DM f/u later this month.  States novolog no longer covered by insurance. Will send generic insulin aspart to pharmacy to see on cost.

## 2018-06-11 NOTE — Telephone Encounter (Signed)
Left message on vm per dpr asking pt to call back asap to schedule a virtual visit with Dr. Darnell Level today.

## 2018-06-11 NOTE — Progress Notes (Signed)
Virtual visit completed through [employer]. Due to national recommendations of social distancing due to [place], a virtual visit is felt to be most appropriate for this patient at this time.   Patient location: home Provider location: [place] at [facility], office If any vitals were documented, they were collected by patient at home unless specified below.     BP (!) 131/58 (BP Location: Right Arm, Patient Position: Sitting, Cuff Size: Normal)   Pulse 73   Ht 5' 11.5" (1.816 m)   Wt 150 lb (68 kg)   BMI 20.63 kg/m    CC: discuss insomnia Subjective:    Patient ID: Scott [name], male    DOB: [birth year], 78 y.o.   MRN: [id number]  HPI: ARIHANT PENNINGS is a 78 y.o. male presenting on 06/11/2018 for Insomnia (Wants to discuss other tx options. Says Belsomra costs too much. ) and Medication Problem (States he received a letter insrance will no longer cover Novolog. Needs alternative. )   Chronic insomnia - since retirement 2004. Trouble falling asleep as well as staying asleep. Bedtime 11pm, wakes up at 5-6am. Dozes off during the day watching TV about 1 nap a day. Dog doesn't wake him up.   Trazodone ineffective - he stopped a few months ago - ran out. But he feels 100mg  dose was effective to help sleep.  Ambien ineffective  Belsomra unaffordable 06/2018  DM - novolog will no longer be covered by insurance - generic insulin aspart may be covered.   CLL and thrombocytopenia - recent nosebleed with worsening chronic anemia s/p transfusion. Latest hgb 7.7 - closely followed by heme/onc. He has stopped aspirin.      Relevant past medical, surgical, family and social history reviewed and updated as indicated. Interim medical history since our last visit reviewed. Allergies and medications reviewed and updated. Outpatient Medications Prior to Visit  Medication Sig Dispense Refill  . acetaminophen (TYLENOL) 650 MG CR tablet Take 650 mg by mouth every 8 (eight) hours as needed for pain.     Marland Kitchen atorvastatin (LIPITOR) 80 MG tablet Take 80 mg by mouth daily.    . carvedilol (COREG) 6.25 MG tablet Take 1 tablet (6.25 mg total) by mouth 2 (two) times daily. Please make annual appt for future refills. Thank you 180 tablet 0  . glucose blood (ONE TOUCH ULTRA TEST) test strip 1 each by Other route 4 (four) times daily. and as needed to check sugars Dx code:  E11.21 400 each 3  . Insulin Detemir (LEVEMIR FLEXTOUCH) 100 UNIT/ML Pen INJECT 25 UNITS SUBCUTANEOUSLY ONCE DAILY AT  10PM 15 pen 3  . levothyroxine (SYNTHROID, LEVOTHROID) 75 MCG tablet Take 1 tablet (75 mcg total) by mouth daily. 90 tablet 1  . nitroGLYCERIN (NITROSTAT) 0.4 MG SL tablet Place 1 tablet (0.4 mg total) under the tongue every 5 (five) minutes as needed for chest pain ((MAX of 3 doses)). 25 tablet 0  . insulin aspart (NOVOLOG) 100 UNIT/ML FlexPen Take 5 units with meals, take 10 units if mealtime sugar >250 15 mL 3  . aspirin EC 81 MG tablet Take 1 tablet (81 mg total) by mouth daily. (Patient not taking: Reported on 06/11/2018) 90 tablet 3  . furosemide (LASIX) 20 MG tablet Take 1 tablet (20 mg total) by mouth daily. 30 tablet 4  . Suvorexant 5 MG TABS Take 1 tablet by mouth at bedtime as needed (sleep). (Patient not taking: Reported on 06/11/2018) 30 tablet 0  . traZODone (DESYREL) 50 MG  tablet Take 75 mg by mouth at bedtime as needed for sleep. Takes 2 tablets at bedtime     No facility-administered medications prior to visit.      Per HPI unless specifically indicated in ROS section below Review of Systems Objective:    BP (!) 131/58 (BP Location: Right Arm, Patient Position: Sitting, Cuff Size: Normal)   Pulse 73   Ht 5' 11.5" (1.816 m)   Wt 150 lb (68 kg)   BMI 20.63 kg/m   Wt Readings from Last 3 Encounters:  06/11/18 150 lb (68 kg)  06/08/18 150 lb 9.6 oz (68.3 kg)  06/01/18 154 lb 15.7 oz (70.3 kg)     Physical exam: Gen: alert, NAD, not ill appearing Pulm: speaks in complete sentences without increased  work of breathing Psych: normal mood, normal thought content      Results for orders placed or performed in visit on 06/05/18  CBC with Differential/Platelet  Result Value Ref Range   WBC 6.3 4.0 - 10.5 K/uL   RBC 2.49 (L) 4.22 - 5.81 MIL/uL   Hemoglobin 7.7 (L) 13.0 - 17.0 g/dL   HCT 23.7 (L) 39.0 - 52.0 %   MCV 95.2 80.0 - 100.0 fL   MCH 30.9 26.0 - 34.0 pg   MCHC 32.5 30.0 - 36.0 g/dL   RDW 19.8 (H) 11.5 - 15.5 %   Platelets 30 (L) 150 - 400 K/uL   nRBC 0.0 0.0 - 0.2 %   Neutrophils Relative % 28 %   Neutro Abs 1.8 1.7 - 7.7 K/uL   Band Neutrophils 1 %   Lymphocytes Relative 62 %   Lymphs Abs 3.9 0.7 - 4.0 K/uL   Monocytes Relative 5 %   Monocytes Absolute 0.3 0.1 - 1.0 K/uL   Eosinophils Relative 2 %   Eosinophils Absolute 0.1 0.0 - 0.5 K/uL   Basophils Relative 2 %   Basophils Absolute 0.1 0.0 - 0.1 K/uL   WBC Morphology      ABNORMAL LYMPHOCYTES CONSISTANT WITH PATIENTS KNOWN CLL   RBC Morphology UNREMARKABLE    Smear Review Normal platelet morphology    Abs Immature Granulocytes 0.00 0.00 - 0.07 K/uL  Hold Tube- Blood Bank  Result Value Ref Range   Blood Bank Specimen SAMPLE AVAILABLE FOR TESTING    Sample Expiration      06/08/2018 Performed at [facility], [address] [place], [place] [address]    Assessment & Plan:  Over 25 minutes were spent face-to-face with the patient during this encounter and >50% of that time was spent on counseling and coordination of care. Patient did need 10+ minutes of troubleshooting to make [employer]Me work on his phone.  Problem List Items Addressed This Visit    Uncontrolled type 2 diabetes mellitus with nephropathy ([place])    Upcoming DM f/u later this month.  States novolog no longer covered by insurance. Will send generic insulin aspart to pharmacy to see on cost.       Relevant Medications   insulin aspart (NOVOLOG FLEXPEN) 100 UNIT/ML FlexPen   Idiopathic thrombocytopenic purpura (ITP) (HCC)   CLL (chronic  lymphocytic leukemia) (HCC)   Chronic insomnia - Primary    Longstanding sleep initiation and maintenance insomnia. Reviewed options. Discussed OTC remedies (melatonin).  He actually states trazodone 100mg  has been effective but he stopped as he ran out of medication. Will refill trazodone at 100mg  nightly.  Sleep hygiene reviewed. Handout printed to be mailed to patient.  Anemia in CKD (chronic kidney disease)    Progressive. Appreciate onc care.           Meds ordered this encounter  Medications  . traZODone (DESYREL) 100 MG tablet    Sig: Take 1 tablet (100 mg total) by mouth at bedtime as needed for sleep.    Dispense:  90 tablet    Refill:  1  . insulin aspart (NOVOLOG FLEXPEN) 100 UNIT/ML FlexPen    Sig: Take 5 units with meals, take 10 units if mealtime sugar >250    Dispense:  15 mL    Refill:  11    Formulate generic in place of novolog per insurance formulary changes   No orders of the defined types were placed in this encounter.   Follow up plan: No follow-ups on file.  [name], MD

## 2018-06-11 NOTE — Patient Instructions (Addendum)
Start trazodone 100mg  nightly - new dose sent to pharmacy You could consider trying over the counter melatonin 3-5mg  (the sleep hormone) at night to help as well.  We will change novolog to plain insulin aspart.   Sleep hygiene checklist: 1. Avoid naps during the day 2. Avoid stimulants such as caffeine and nicotine. Avoid bedtime alcohol (it can speed onset of sleep but the body's metabolism can cause awakenings). 3. All forms of exercise help ensure sound sleep - limit vigorous exercise to morning or late afternoon 4. Avoid food too close to bedtime including chocolate (which contains caffeine) 5. Soak up natural light 6. Establish regular bedtime routine. 7. Associate bed with sleep - avoid TV, computer or phone, reading while in bed. 8. Ensure pleasant, relaxing sleep environment - quiet, dark, cool room.   Insomnia Insomnia is a sleep disorder that makes it difficult to fall asleep or stay asleep. Insomnia can cause fatigue, low energy, difficulty concentrating, mood swings, and poor performance at work or school. There are three different ways to classify insomnia:  Difficulty falling asleep.  Difficulty staying asleep.  Waking up too early in the morning. Any type of insomnia can be long-term (chronic) or short-term (acute). Both are common. Short-term insomnia usually lasts for three months or less. Chronic insomnia occurs at least three times a week for longer than three months. What are the causes? Insomnia may be caused by another condition, situation, or substance, such as:  Anxiety.  Certain medicines.  Gastroesophageal reflux disease (GERD) or other gastrointestinal conditions.  Asthma or other breathing conditions.  Restless legs syndrome, sleep apnea, or other sleep disorders.  Chronic pain.  Menopause.  Stroke.  Abuse of alcohol, tobacco, or illegal drugs.  Mental health conditions, such as depression.  Caffeine.  Neurological disorders, such as  Alzheimer's disease.  An overactive thyroid (hyperthyroidism). Sometimes, the cause of insomnia may not be known. What increases the risk? Risk factors for insomnia include:  Gender. Women are affected more often than men.  Age. Insomnia is more common as you get older.  Stress.  Lack of exercise.  Irregular work schedule or working night shifts.  Traveling between different time zones.  Certain medical and mental health conditions. What are the signs or symptoms? If you have insomnia, the main symptom is having trouble falling asleep or having trouble staying asleep. This may lead to other symptoms, such as:  Feeling fatigued or having low energy.  Feeling nervous about going to sleep.  Not feeling rested in the morning.  Having trouble concentrating.  Feeling irritable, anxious, or depressed. How is this diagnosed? This condition may be diagnosed based on:  Your symptoms and medical history. Your health care provider may ask about: ? Your sleep habits. ? Any medical conditions you have. ? Your mental health.  A physical exam. How is this treated? Treatment for insomnia depends on the cause. Treatment may focus on treating an underlying condition that is causing insomnia. Treatment may also include:  Medicines to help you sleep.  Counseling or therapy.  Lifestyle adjustments to help you sleep better. Follow these instructions at home: Eating and drinking   Limit or avoid alcohol, caffeinated beverages, and cigarettes, especially close to bedtime. These can disrupt your sleep.  Do not eat a large meal or eat spicy foods right before bedtime. This can lead to digestive discomfort that can make it hard for you to sleep. Sleep habits   Keep a sleep diary to help you and your health  care provider figure out what could be causing your insomnia. Write down: ? When you sleep. ? When you wake up during the night. ? How well you sleep. ? How rested you feel the  next day. ? Any side effects of medicines you are taking. ? What you eat and drink.  Make your bedroom a dark, comfortable place where it is easy to fall asleep. ? Put up shades or blackout curtains to block light from outside. ? Use a white noise machine to block noise. ? Keep the temperature cool.  Limit screen use before bedtime. This includes: ? Watching TV. ? Using your smartphone, tablet, or computer.  Stick to a routine that includes going to bed and waking up at the same times every day and night. This can help you fall asleep faster. Consider making a quiet activity, such as reading, part of your nighttime routine.  Try to avoid taking naps during the day so that you sleep better at night.  Get out of bed if you are still awake after 15 minutes of trying to sleep. Keep the lights down, but try reading or doing a quiet activity. When you feel sleepy, go back to bed. General instructions  Take over-the-counter and prescription medicines only as told by your health care provider.  Exercise regularly, as told by your health care provider. Avoid exercise starting several hours before bedtime.  Use relaxation techniques to manage stress. Ask your health care provider to suggest some techniques that may work well for you. These may include: ? Breathing exercises. ? Routines to release muscle tension. ? Visualizing peaceful scenes.  Make sure that you drive carefully. Avoid driving if you feel very sleepy.  Keep all follow-up visits as told by your health care provider. This is important. Contact a health care provider if:  You are tired throughout the day.  You have trouble in your daily routine due to sleepiness.  You continue to have sleep problems, or your sleep problems get worse. Get help right away if:  You have serious thoughts about hurting yourself or someone else. If you ever feel like you may hurt yourself or others, or have thoughts about taking your own life,  get help right away. You can go to your nearest emergency department or call:  Your local emergency services (911 in the [place]  A suicide crisis helpline, such as the [facility] at 703-608-0958. This is open 24 hours a day. Summary  Insomnia is a sleep disorder that makes it difficult to fall asleep or stay asleep.  Insomnia can be long-term (chronic) or short-term (acute).  Treatment for insomnia depends on the cause. Treatment may focus on treating an underlying condition that is causing insomnia.  Keep a sleep diary to help you and your health care provider figure out what could be causing your insomnia. This information is not intended to replace advice given to you by your health care provider. Make sure you discuss any questions you have with your health care provider. Document Released: 02/16/2000 Document Revised: 11/28/2016 Document Reviewed: 11/28/2016 Elsevier Interactive Patient Education  2019 [employer].

## 2018-06-11 NOTE — Assessment & Plan Note (Signed)
Progressive. Appreciate onc care.

## 2018-06-11 NOTE — Telephone Encounter (Signed)
Per Shirlean Mylar, pt is scheduled today for Doxy.me visit at 10:30.  Left message for pt to call back.  I need to get some info from pt before visit.

## 2018-06-19 ENCOUNTER — Other Ambulatory Visit: Payer: Self-pay | Admitting: *Deleted

## 2018-06-19 ENCOUNTER — Other Ambulatory Visit: Payer: Self-pay | Admitting: Oncology

## 2018-06-19 ENCOUNTER — Inpatient Hospital Stay: Payer: Medicare Other

## 2018-06-19 ENCOUNTER — Other Ambulatory Visit: Payer: Self-pay

## 2018-06-19 DIAGNOSIS — I6523 Occlusion and stenosis of bilateral carotid arteries: Secondary | ICD-10-CM | POA: Diagnosis not present

## 2018-06-19 DIAGNOSIS — C9110 Chronic lymphocytic leukemia of B-cell type not having achieved remission: Secondary | ICD-10-CM

## 2018-06-19 DIAGNOSIS — N183 Chronic kidney disease, stage 3 (moderate): Secondary | ICD-10-CM | POA: Diagnosis not present

## 2018-06-19 DIAGNOSIS — D649 Anemia, unspecified: Secondary | ICD-10-CM

## 2018-06-19 DIAGNOSIS — D631 Anemia in chronic kidney disease: Secondary | ICD-10-CM | POA: Diagnosis not present

## 2018-06-19 DIAGNOSIS — D693 Immune thrombocytopenic purpura: Secondary | ICD-10-CM | POA: Diagnosis not present

## 2018-06-19 DIAGNOSIS — Z87891 Personal history of nicotine dependence: Secondary | ICD-10-CM | POA: Diagnosis not present

## 2018-06-19 DIAGNOSIS — D696 Thrombocytopenia, unspecified: Secondary | ICD-10-CM | POA: Diagnosis not present

## 2018-06-19 LAB — CBC WITH DIFFERENTIAL/PLATELET
Abs Immature Granulocytes: 0.1 10*3/uL — ABNORMAL HIGH (ref 0.00–0.07)
Basophils Absolute: 0 10*3/uL (ref 0.0–0.1)
Basophils Relative: 0 %
Eosinophils Absolute: 0.1 10*3/uL (ref 0.0–0.5)
Eosinophils Relative: 2 %
HCT: 20.5 % — ABNORMAL LOW (ref 39.0–52.0)
Hemoglobin: 6.6 g/dL — ABNORMAL LOW (ref 13.0–17.0)
Lymphocytes Relative: 69 %
Lymphs Abs: 3.9 10*3/uL (ref 0.7–4.0)
MCH: 31.1 pg (ref 26.0–34.0)
MCHC: 32.2 g/dL (ref 30.0–36.0)
MCV: 96.7 fL (ref 80.0–100.0)
Metamyelocytes Relative: 1 %
Monocytes Absolute: 0.2 10*3/uL (ref 0.1–1.0)
Monocytes Relative: 4 %
Neutro Abs: 1.3 10*3/uL — ABNORMAL LOW (ref 1.7–7.7)
Neutrophils Relative %: 24 %
Platelets: 50 10*3/uL — ABNORMAL LOW (ref 150–400)
RBC: 2.12 MIL/uL — ABNORMAL LOW (ref 4.22–5.81)
RDW: 21.6 % — ABNORMAL HIGH (ref 11.5–15.5)
Smear Review: NORMAL
WBC Morphology: ABNORMAL
WBC: 5.6 10*3/uL (ref 4.0–10.5)
nRBC: 0 % (ref 0.0–0.2)

## 2018-06-19 LAB — SAMPLE TO BLOOD BANK

## 2018-06-21 ENCOUNTER — Other Ambulatory Visit: Payer: Self-pay

## 2018-06-22 ENCOUNTER — Encounter: Payer: Self-pay | Admitting: Oncology

## 2018-06-22 ENCOUNTER — Inpatient Hospital Stay: Payer: Medicare Other

## 2018-06-22 ENCOUNTER — Telehealth: Payer: Self-pay

## 2018-06-22 ENCOUNTER — Inpatient Hospital Stay (HOSPITAL_BASED_OUTPATIENT_CLINIC_OR_DEPARTMENT_OTHER): Payer: Medicare Other | Admitting: Oncology

## 2018-06-22 ENCOUNTER — Other Ambulatory Visit: Payer: Self-pay

## 2018-06-22 VITALS — BP 133/71 | HR 61 | Temp 96.4°F | Resp 20

## 2018-06-22 VITALS — BP 118/64 | HR 61 | Temp 95.7°F | Resp 18 | Wt 151.0 lb

## 2018-06-22 DIAGNOSIS — C9110 Chronic lymphocytic leukemia of B-cell type not having achieved remission: Secondary | ICD-10-CM | POA: Diagnosis not present

## 2018-06-22 DIAGNOSIS — N183 Chronic kidney disease, stage 3 (moderate): Secondary | ICD-10-CM

## 2018-06-22 DIAGNOSIS — E1122 Type 2 diabetes mellitus with diabetic chronic kidney disease: Secondary | ICD-10-CM | POA: Diagnosis not present

## 2018-06-22 DIAGNOSIS — Z87891 Personal history of nicotine dependence: Secondary | ICD-10-CM | POA: Diagnosis not present

## 2018-06-22 DIAGNOSIS — D693 Immune thrombocytopenic purpura: Secondary | ICD-10-CM

## 2018-06-22 DIAGNOSIS — I6523 Occlusion and stenosis of bilateral carotid arteries: Secondary | ICD-10-CM | POA: Diagnosis not present

## 2018-06-22 DIAGNOSIS — D696 Thrombocytopenia, unspecified: Secondary | ICD-10-CM | POA: Diagnosis not present

## 2018-06-22 DIAGNOSIS — D631 Anemia in chronic kidney disease: Secondary | ICD-10-CM

## 2018-06-22 DIAGNOSIS — I129 Hypertensive chronic kidney disease with stage 1 through stage 4 chronic kidney disease, or unspecified chronic kidney disease: Secondary | ICD-10-CM | POA: Diagnosis not present

## 2018-06-22 DIAGNOSIS — D649 Anemia, unspecified: Secondary | ICD-10-CM

## 2018-06-22 LAB — PREPARE RBC (CROSSMATCH)

## 2018-06-22 MED ORDER — ACETAMINOPHEN 325 MG PO TABS
650.0000 mg | ORAL_TABLET | Freq: Once | ORAL | Status: AC
  Administered 2018-06-22: 650 mg via ORAL
  Filled 2018-06-22: qty 2

## 2018-06-22 MED ORDER — EPOETIN ALFA 40000 UNIT/ML IJ SOLN
40000.0000 [IU] | Freq: Once | INTRAMUSCULAR | Status: AC
  Administered 2018-06-22: 40000 [IU] via SUBCUTANEOUS
  Filled 2018-06-22: qty 1

## 2018-06-22 MED ORDER — DIPHENHYDRAMINE HCL 50 MG/ML IJ SOLN
25.0000 mg | Freq: Once | INTRAMUSCULAR | Status: AC
  Administered 2018-06-22: 25 mg via INTRAVENOUS
  Filled 2018-06-22: qty 1

## 2018-06-22 MED ORDER — SODIUM CHLORIDE 0.9% IV SOLUTION
250.0000 mL | Freq: Once | INTRAVENOUS | Status: AC
  Administered 2018-06-22: 250 mL via INTRAVENOUS
  Filled 2018-06-22: qty 250

## 2018-06-22 MED ORDER — ROMIPLOSTIM INJECTION 500 MCG
4.0000 ug/kg | Freq: Once | SUBCUTANEOUS | Status: AC
  Administered 2018-06-22: 11:00:00 275 ug via SUBCUTANEOUS
  Filled 2018-06-22: qty 0.55

## 2018-06-22 NOTE — Progress Notes (Signed)
[facility]  [phone] [phone]  ID: Scott [name] OB: April 21, 1940  MR#: [id number]  VQQ#:[id number]  Patient Care Team: [name], MD as PCP - General (Family Medicine) Lloyd Huger, MD as Consulting Physician (Oncology) Clent Jacks, MD as Consulting Physician (Ophthalmology) Dorothy Spark, MD as Consulting Physician (Cardiology) Madelon Lips, MD as Consulting Physician (Nephrology)  CHIEF COMPLAINT: 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  INTERVAL HISTORY: Patient returns to clinic for repeat laboratory, further evaluation, and continuation of Nplate, Procrit, and blood transfusion. He has chronic weakness and fatigue, but otherwise feels well.  He continues to have easy bruising.  He denies any dizziness or other neurologic complaints. He has a good appetite and denies weight loss. He denies any night sweats. He has noted no lymphadenopathy.  He has no chest pain, shortness of breath, cough, or hemoptysis.  He denies any nausea, vomiting, constipation, or diarrhea. He has no urinary complaints.  Patient offers no further specific complaints today.  REVIEW OF SYSTEMS:   Review of Systems  Constitutional: Positive for malaise/fatigue. Negative for fever and weight loss.  Respiratory: Negative.  Negative for cough and shortness of breath.   Cardiovascular: Negative.  Negative for chest pain and leg swelling.  Gastrointestinal: Negative.  Negative for abdominal pain, blood in stool and melena.  Genitourinary: Negative.  Negative for dysuria and hematuria.  Musculoskeletal: Negative.  Negative for back pain and neck pain.  Skin: Negative.  Negative for itching and rash.  Neurological: Positive for weakness. Negative for dizziness, sensory change, focal weakness and headaches.  Endo/Heme/Allergies: Bruises/bleeds easily.  Psychiatric/Behavioral: Negative.  The patient is not nervous/anxious.     As per HPI.  Otherwise, a complete review of systems is negative.  PAST MEDICAL HISTORY: Past Medical History:  Diagnosis Date  . Acquired hand deformity 1962   hand saw accident at work  . Anemia 10/11/2011  . Arthritis   . Bradycardia 10/10/2011  . CAD (coronary artery disease) 10/10/2011   MI s/p PTCA (Dx-OM2 proximal concentric stenosis)  . CKD (chronic kidney disease) stage 3, GFR 30-59 ml/min (HCC)    Scott [name]  . CLL (chronic lymphocytic leukemia) ([place])   . Colon polyps   . Generalized headaches    frequent  . GI bleed 07/08/2017  . Glaucoma    s/p laser surgery  . HLD (hyperlipidemia)   . Hypertension   . Hypothyroidism 10/10/2011  . Thrombocytopenia ([place]) 10/11/2011  . Type 2 diabetes with nephropathy [facility])    DM refresher course [facility] (04/2013)    PAST SURGICAL HISTORY: Past Surgical History:  Procedure Laterality Date  . BACK SURGERY     cervical neck  . CATARACT EXTRACTION, BILATERAL Bilateral 2017  . COLONOSCOPY  11/2008   1 polyp, diverticulosis, rec rpt 5 yrs (Dr. Oletta Lamas, [name])  . COLONOSCOPY  06/2014   hyperplastic polyp, rpt 5 yrs ([place])  . COLONOSCOPY WITH PROPOFOL N/A 11/17/2017   TA, HP, (Vanga, Tally Due, MD)  . ESOPHAGOGASTRODUODENOSCOPY (EGD) WITH PROPOFOL N/A 11/17/2017   healing erosive gastritis, intestinal metaplasia, neg H pylori (Vanga, Tally Due, MD)  . EYE SURGERY  2012   laser surgery for glaucoma  . PTCA  1994, 1995  . US ECHOCARDIOGRAPHY  10/2013   inferior wall hypokinesis, mild LVH, EF 50-55%, mild MR and LA dilation    FAMILY HISTORY: Family History  Problem Relation Age of Onset  . Diabetes Sister   . Stomach cancer Sister 19  . Pneumonia  Father        caused death  . Other Brother        no communication with brother so unsure of any health conditions  . Coronary artery disease Son 53       5v CABG and stents  . Hyperlipidemia Sister   . Stroke Neg Hx   . Heart attack Neg Hx     ADVANCED DIRECTIVES (Y/N):  N  HEALTH MAINTENANCE:  Social History   Tobacco Use  . Smoking status: Former Smoker    Last attempt to quit: 03/04/1978    Years since quitting: 40.3  . Smokeless tobacco: Never Used  Substance Use Topics  . Alcohol use: No  . Drug use: No     Colonoscopy:  PAP:  Bone density:  Lipid panel:  No Known Allergies  Current Outpatient Medications  Medication Sig Dispense Refill  . acetaminophen (TYLENOL) 650 MG CR tablet Take 650 mg by mouth every 8 (eight) hours as needed for pain.    Marland Kitchen atorvastatin (LIPITOR) 80 MG tablet Take 80 mg by mouth daily.    . carvedilol (COREG) 6.25 MG tablet Take 1 tablet (6.25 mg total) by mouth 2 (two) times daily. Please make annual appt for future refills. Thank you 180 tablet 0  . glucose blood (ONE TOUCH ULTRA TEST) test strip 1 each by Other route 4 (four) times daily. and as needed to check sugars Dx code:  E11.21 400 each 3  . insulin aspart (NOVOLOG FLEXPEN) 100 UNIT/ML FlexPen Take 5 units with meals, take 10 units if mealtime sugar >250 15 mL 11  . Insulin Detemir (LEVEMIR FLEXTOUCH) 100 UNIT/ML Pen INJECT 25 UNITS SUBCUTANEOUSLY ONCE DAILY AT  10PM 15 pen 3  . levothyroxine (SYNTHROID, LEVOTHROID) 75 MCG tablet Take 1 tablet (75 mcg total) by mouth daily. 90 tablet 1  . nitroGLYCERIN (NITROSTAT) 0.4 MG SL tablet Place 1 tablet (0.4 mg total) under the tongue every 5 (five) minutes as needed for chest pain ((MAX of 3 doses)). 25 tablet 0  . traZODone (DESYREL) 100 MG tablet Take 1 tablet (100 mg total) by mouth at bedtime as needed for sleep. 90 tablet 1   No current facility-administered medications for this visit.     OBJECTIVE: Vitals:   06/22/18 0857  BP: 118/64  Pulse: 61  Resp: 18  Temp: (!) 95.7 F (35.4 C)     Body mass index is 20.77 kg/m.    ECOG FS:0 - Asymptomatic  General: Well-developed, well-nourished, no acute distress. Eyes: Pink conjunctiva, anicteric sclera. HEENT: Normocephalic, moist mucous membranes. Lungs: Clear to auscultation  bilaterally. Heart: Regular rate and rhythm. No rubs, murmurs, or gallops. Abdomen: Soft, nontender, nondistended. No organomegaly noted, normoactive bowel sounds. Musculoskeletal: No edema, cyanosis, or clubbing. Neuro: Alert, answering all questions appropriately. Cranial nerves grossly intact. Skin: No rashes or petechiae noted. Psych: Normal affect.  LAB RESULTS:  Lab Results  Component Value Date   NA 144 05/30/2018   K 4.3 05/30/2018   CL 109 05/30/2018   CO2 26 05/30/2018   GLUCOSE 88 05/30/2018   BUN 43 (H) 05/30/2018   CREATININE 1.47 (H) 05/30/2018   CALCIUM 9.5 05/30/2018   PROT 6.3 (L) 04/29/2018   ALBUMIN 3.5 04/29/2018   AST 28 04/29/2018   ALT 16 04/29/2018   ALKPHOS 100 04/29/2018   BILITOT 1.1 04/29/2018   GFRNONAA 45 (L) 05/30/2018   GFRAA 53 (L) 05/30/2018    Lab Results  Component Value Date  WBC 5.6 06/19/2018   NEUTROABS 1.3 (L) 06/19/2018   HGB 6.6 (L) 06/19/2018   HCT 20.5 (L) 06/19/2018   MCV 96.7 06/19/2018   PLT 50 (L) 06/19/2018     STUDIES: No results found.  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  PLAN:    1. CLL: Bone marrow biopsy completed on October 23, 2017 reviewed independently with involvement with CLL, but unclear percentage.  Although patient was reported to have normal cytogenetics in his bone marrow sample, peripheral blood FISH testing 50% incidence of mutation in the ATM gene which is commonly associated with deletion 11q. 11q- is associated with an unfavorable prognosis and high risk of not responding to initial treatment. 13q- is a more common mutation and is actually associated with a more favorable prognosis. Patient's white blood cell count continues to be within normal limits.  He received his last infusion of single agent Rituxan on September 03, 2017. If patient requires additional treatment could possibly transition him to [place].   2.  Thrombocytopenia: Patient's platelet count has slightly  improved to 50. 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  Patient has received high-dose prednisone, IVIG, and Rituxan with no appreciable durability to improve his platelet count.  Will increase dose of Nplate to 4 mcg/kg today.  Continue weekly Nplate and Procrit and return to clinic in 2 weeks for further evaluation. 3.  Anemia: Patient's hemoglobin has significantly decreased, therefore will proceed with 2 units pRBCs today. Continue with 40,000 units Procrit as well.  He previously reported black tarry stools, but none recently.  Patient had colonoscopy on November 17, 2017 that removed 2 nonmalignant polyps.  EGD on the same day revealed nonbleeding erosive gastropathy with multiple nonbleeding duodenal ulcers.  Consider referral back to GI after COVID-19.  Return to clinic in 2 weeks as above.  We also discussed the possibility of repeating a bone marrow biopsy once the COVID-19 pandemic resolves.  4.  Reaction to Rituxan: Rate-based.  Patient will require premedications for any further infusions with Rituxan.    I spent a total of 30 minutes face-to-face with the patient of which greater than 50% of the visit was spent in counseling and coordination of care as detailed above.  Patient expressed understanding and was in agreement with this plan. He also understands that He can call clinic at any time with any questions, concerns, or complaints.    Lloyd Huger, MD   06/22/2018 1:01 PM

## 2018-06-22 NOTE — Telephone Encounter (Signed)
Left detailed VM w COVID screen and curbside info   

## 2018-06-22 NOTE — Progress Notes (Signed)
Patient denies any concerns today.  

## 2018-06-24 ENCOUNTER — Other Ambulatory Visit (INDEPENDENT_AMBULATORY_CARE_PROVIDER_SITE_OTHER): Payer: Medicare Other

## 2018-06-24 ENCOUNTER — Other Ambulatory Visit: Payer: Self-pay

## 2018-06-24 ENCOUNTER — Other Ambulatory Visit: Payer: Self-pay | Admitting: Family Medicine

## 2018-06-24 DIAGNOSIS — IMO0002 Reserved for concepts with insufficient information to code with codable children: Secondary | ICD-10-CM

## 2018-06-24 DIAGNOSIS — E1121 Type 2 diabetes mellitus with diabetic nephropathy: Secondary | ICD-10-CM | POA: Diagnosis not present

## 2018-06-24 DIAGNOSIS — E1165 Type 2 diabetes mellitus with hyperglycemia: Principal | ICD-10-CM

## 2018-06-24 DIAGNOSIS — D500 Iron deficiency anemia secondary to blood loss (chronic): Secondary | ICD-10-CM

## 2018-06-24 DIAGNOSIS — N183 Chronic kidney disease, stage 3 unspecified: Secondary | ICD-10-CM

## 2018-06-24 DIAGNOSIS — E039 Hypothyroidism, unspecified: Secondary | ICD-10-CM | POA: Diagnosis not present

## 2018-06-24 DIAGNOSIS — E7849 Other hyperlipidemia: Secondary | ICD-10-CM

## 2018-06-24 DIAGNOSIS — C9110 Chronic lymphocytic leukemia of B-cell type not having achieved remission: Secondary | ICD-10-CM

## 2018-06-24 LAB — RENAL FUNCTION PANEL
Albumin: 4.1 g/dL (ref 3.5–5.2)
BUN: 50 mg/dL — ABNORMAL HIGH (ref 6–23)
CO2: 28 mEq/L (ref 19–32)
Calcium: 9.8 mg/dL (ref 8.4–10.5)
Chloride: 108 mEq/L (ref 96–112)
Creatinine, Ser: 1.48 mg/dL (ref 0.40–1.50)
GFR: 45.96 mL/min — ABNORMAL LOW (ref >60.00)
Glucose, Bld: 205 mg/dL — ABNORMAL HIGH (ref 70–99)
Phosphorus: 4.3 mg/dL (ref 2.3–4.6)
Potassium: 5.3 mEq/L — ABNORMAL HIGH (ref 3.5–5.1)
Sodium: 142 mEq/L (ref 135–145)

## 2018-06-24 LAB — TYPE AND SCREEN
ABO/RH(D): A POS
Antibody Screen: NEGATIVE
Unit division: 0
Unit division: 0

## 2018-06-24 LAB — BPAM RBC
Blood Product Expiration Date: 202005012359
Blood Product Expiration Date: 202005042359
ISSUE DATE / TIME: 202004201045
ISSUE DATE / TIME: 202004201241
Unit Type and Rh: 5100
Unit Type and Rh: 5100

## 2018-06-24 LAB — TSH: TSH: 7.17 u[IU]/mL — ABNORMAL HIGH (ref 0.35–4.50)

## 2018-06-24 LAB — HEMOGLOBIN A1C: Hgb A1c MFr Bld: 7.5 % — ABNORMAL HIGH (ref 4.6–6.5)

## 2018-06-24 LAB — T4, FREE: Free T4: 0.9 ng/dL (ref 0.60–1.60)

## 2018-06-26 ENCOUNTER — Other Ambulatory Visit: Payer: Self-pay

## 2018-06-26 ENCOUNTER — Ambulatory Visit (INDEPENDENT_AMBULATORY_CARE_PROVIDER_SITE_OTHER): Payer: Medicare Other | Admitting: Family Medicine

## 2018-06-26 ENCOUNTER — Ambulatory Visit: Payer: Medicare Other | Admitting: Family Medicine

## 2018-06-26 ENCOUNTER — Encounter: Payer: Self-pay | Admitting: Family Medicine

## 2018-06-26 ENCOUNTER — Telehealth: Payer: Self-pay

## 2018-06-26 VITALS — BP 139/74 | HR 61 | Ht 71.5 in | Wt 150.0 lb

## 2018-06-26 DIAGNOSIS — E02 Subclinical iodine-deficiency hypothyroidism: Secondary | ICD-10-CM

## 2018-06-26 DIAGNOSIS — E1121 Type 2 diabetes mellitus with diabetic nephropathy: Secondary | ICD-10-CM

## 2018-06-26 DIAGNOSIS — IMO0002 Reserved for concepts with insufficient information to code with codable children: Secondary | ICD-10-CM

## 2018-06-26 DIAGNOSIS — I6523 Occlusion and stenosis of bilateral carotid arteries: Secondary | ICD-10-CM | POA: Diagnosis not present

## 2018-06-26 DIAGNOSIS — N183 Chronic kidney disease, stage 3 unspecified: Secondary | ICD-10-CM

## 2018-06-26 DIAGNOSIS — I10 Essential (primary) hypertension: Secondary | ICD-10-CM

## 2018-06-26 DIAGNOSIS — E1165 Type 2 diabetes mellitus with hyperglycemia: Secondary | ICD-10-CM

## 2018-06-26 MED ORDER — LEVOTHYROXINE SODIUM 88 MCG PO TABS: 88.0000 ug | ORAL_TABLET | Freq: Every day | ORAL | 1 refills | Status: DC

## 2018-06-26 NOTE — Telephone Encounter (Signed)
Left message on vm per dpr asking pt to call back.  Need to collect info for today's 9:00 Doxy.me visit.

## 2018-06-26 NOTE — Assessment & Plan Note (Signed)
Chronic, stable. Continue to monitor. He does see renal regularly.

## 2018-06-26 NOTE — Assessment & Plan Note (Signed)
Chronic stable on carvedilol.

## 2018-06-26 NOTE — Assessment & Plan Note (Signed)
Chronic, stable. A1c improving. Continue current regimen. RTC 3 mo DM f/u visit.

## 2018-06-26 NOTE — Assessment & Plan Note (Signed)
Low TSH despite compliance with levothyroxine 40mcg daily - will increase to 23mcg and reassess at f/u visit.

## 2018-06-26 NOTE — Telephone Encounter (Signed)
Spoke with pt collect necessary info for visit.

## 2018-06-26 NOTE — Progress Notes (Signed)
Virtual visit completed through Doxy.Me. Due to national recommendations of social distancing due to [name] 19, a virtual visit is felt to be most appropriate for this patient at this time.   Patient location: home Provider location: [place] at [facility], office If any vitals were documented, they were collected by patient at home unless specified below.    BP 139/74 (BP Location: Left Arm, Patient Position: Sitting, Cuff Size: Normal)   Pulse 61   Ht 5' 11.5" (1.816 m)   Wt 150 lb (68 kg)   BMI 20.63 kg/m    CC: 3 mo f/u visit Subjective:    Patient ID: Scott [name], male    DOB: 07/09/1940, 78 y.o.   MRN: [id number]  HPI: Scott [name] is a 78 y.o. male presenting on 06/26/2018 for Follow-up (3 mo f/u. Says BS was 95 this morning. )   Chronic insomnia - see prior note for details. Last visit we continued trazodone 100mg  dose.   Just had blood transfusion 2u on Monday. Known CLL and ITP followed by heme ([place])  BP log - 120-140/50-70, HR 60-70s Sugar log - 80-150s, rarely 200, 300.   DM (brittle) - does regularly check sugars and brings log. Compliant with antihyperglycemic regimen which includes: levemir 25u nightly insulin aspart 5u with meals (10u if CBG >250). Denies low sugars or hypoglycemic symptoms. Denies paresthesias. Last diabetic eye exam 03/2018. Pneumovax: 2013. Prevnar: 2015. Glucometer brand: onetouch. DSME: refresher course 03/2018. Lab Results  Component Value Date   HGBA1C 7.5 (H) 06/24/2018   Diabetic Foot Exam - Simple   No data filed     Lab Results  Component Value Date   MICROALBUR 5.6 (H) 03/27/2018         Relevant past medical, surgical, family and social history reviewed and updated as indicated. Interim medical history since our last visit reviewed. Allergies and medications reviewed and updated. Outpatient Medications Prior to Visit  Medication Sig Dispense Refill  . acetaminophen (TYLENOL) 650 MG CR tablet Take 650 mg by mouth  every 8 (eight) hours as needed for pain.    Marland Kitchen atorvastatin (LIPITOR) 80 MG tablet Take 80 mg by mouth daily.    . carvedilol (COREG) 6.25 MG tablet Take 1 tablet (6.25 mg total) by mouth 2 (two) times daily. Please make annual appt for future refills. Thank you 180 tablet 0  . glucose blood (ONE TOUCH ULTRA TEST) test strip 1 each by Other route 4 (four) times daily. and as needed to check sugars Dx code:  E11.21 400 each 3  . insulin aspart (NOVOLOG FLEXPEN) 100 UNIT/ML FlexPen Take 5 units with meals, take 10 units if mealtime sugar >250 15 mL 11  . Insulin Detemir (LEVEMIR FLEXTOUCH) 100 UNIT/ML Pen INJECT 25 UNITS SUBCUTANEOUSLY ONCE DAILY AT  10PM 15 pen 3  . nitroGLYCERIN (NITROSTAT) 0.4 MG SL tablet Place 1 tablet (0.4 mg total) under the tongue every 5 (five) minutes as needed for chest pain ((MAX of 3 doses)). 25 tablet 0  . traZODone (DESYREL) 100 MG tablet Take 1 tablet (100 mg total) by mouth at bedtime as needed for sleep. 90 tablet 1  . levothyroxine (SYNTHROID, LEVOTHROID) 75 MCG tablet Take 1 tablet (75 mcg total) by mouth daily. 90 tablet 1   No facility-administered medications prior to visit.      Per HPI unless specifically indicated in ROS section below Review of Systems Objective:    BP 139/74 (BP Location: Left Arm, Patient Position: Sitting,  Cuff Size: Normal)   Pulse 61   Ht 5' 11.5" (1.816 m)   Wt 150 lb (68 kg)   BMI 20.63 kg/m   Wt Readings from Last 3 Encounters:  06/26/18 150 lb (68 kg)  06/22/18 151 lb (68.5 kg)  06/11/18 150 lb (68 kg)     Physical exam: Gen: alert, NAD, not ill appearing Pulm: speaks in complete sentences without increased work of breathing Psych: normal mood, normal thought content      Results for orders placed or performed in visit on 06/24/18  Renal function panel  Result Value Ref Range   Sodium 142 135 - 145 mEq/L   Potassium 5.3 No hemolysis seen (H) 3.5 - 5.1 mEq/L   Chloride 108 96 - 112 mEq/L   CO2 28 19 - 32 mEq/L    Calcium 9.8 8.4 - 10.5 mg/dL   Albumin 4.1 3.5 - 5.2 g/dL   BUN 50 (H) 6 - 23 mg/dL   Creatinine, Ser 1.48 0.40 - 1.50 mg/dL   Glucose, Bld 205 (H) 70 - 99 mg/dL   Phosphorus 4.3 2.3 - 4.6 mg/dL   GFR 45.96 (L) >60.00 mL/min  TSH  Result Value Ref Range   TSH 7.17 (H) 0.35 - 4.50 uIU/mL  T4, free  Result Value Ref Range   Free T4 0.90 0.60 - 1.60 ng/dL  Hemoglobin A1c  Result Value Ref Range   Hgb A1c MFr Bld 7.5 (H) 4.6 - 6.5 %   Assessment & Plan:   Problem List Items Addressed This Visit    Uncontrolled type 2 diabetes mellitus with nephropathy ([place]) - Primary    Chronic, stable. A1c improving. Continue current regimen. RTC 3 mo DM f/u visit.       Hypothyroidism    Low TSH despite compliance with levothyroxine 31mcg daily - will increase to 54mcg and reassess at f/u visit.       Relevant Medications   levothyroxine (SYNTHROID) 88 MCG tablet   Hypertension    Chronic stable on carvedilol.       CKD (chronic kidney disease) stage 3, GFR 30-59 ml/min (HCC)    Chronic, stable. Continue to monitor. He does see renal regularly.           Meds ordered this encounter  Medications  . levothyroxine (SYNTHROID) 88 MCG tablet    Sig: Take 1 tablet (88 mcg total) by mouth daily.    Dispense:  90 tablet    Refill:  1    Note new sig   No orders of the defined types were placed in this encounter.   Follow up plan: Return in about 3 months (around 09/25/2018) for follow up visit.  [name], MD

## 2018-06-27 LAB — FRUCTOSAMINE: Fructosamine: 392 umol/L — ABNORMAL HIGH (ref 205–285)

## 2018-06-28 ENCOUNTER — Other Ambulatory Visit: Payer: Self-pay

## 2018-06-29 ENCOUNTER — Other Ambulatory Visit: Payer: Self-pay

## 2018-06-29 ENCOUNTER — Inpatient Hospital Stay: Payer: Medicare Other

## 2018-06-29 ENCOUNTER — Other Ambulatory Visit: Payer: Self-pay | Admitting: Oncology

## 2018-06-29 VITALS — BP 131/60 | HR 63

## 2018-06-29 DIAGNOSIS — D693 Immune thrombocytopenic purpura: Secondary | ICD-10-CM | POA: Diagnosis not present

## 2018-06-29 DIAGNOSIS — N183 Chronic kidney disease, stage 3 (moderate): Secondary | ICD-10-CM | POA: Diagnosis not present

## 2018-06-29 DIAGNOSIS — D696 Thrombocytopenia, unspecified: Secondary | ICD-10-CM | POA: Diagnosis not present

## 2018-06-29 DIAGNOSIS — Z87891 Personal history of nicotine dependence: Secondary | ICD-10-CM | POA: Diagnosis not present

## 2018-06-29 DIAGNOSIS — C9110 Chronic lymphocytic leukemia of B-cell type not having achieved remission: Secondary | ICD-10-CM

## 2018-06-29 DIAGNOSIS — I6523 Occlusion and stenosis of bilateral carotid arteries: Secondary | ICD-10-CM | POA: Diagnosis not present

## 2018-06-29 DIAGNOSIS — D631 Anemia in chronic kidney disease: Secondary | ICD-10-CM | POA: Diagnosis not present

## 2018-06-29 LAB — CBC WITH DIFFERENTIAL/PLATELET
Abs Immature Granulocytes: 0.06 10*3/uL (ref 0.00–0.07)
Basophils Absolute: 0 10*3/uL (ref 0.0–0.1)
Basophils Relative: 1 %
Eosinophils Absolute: 0.2 10*3/uL (ref 0.0–0.5)
Eosinophils Relative: 4 %
HCT: 26.1 % — ABNORMAL LOW (ref 39.0–52.0)
Hemoglobin: 8.5 g/dL — ABNORMAL LOW (ref 13.0–17.0)
Immature Granulocytes: 1 %
Lymphocytes Relative: 63 %
Lymphs Abs: 3.9 10*3/uL (ref 0.7–4.0)
MCH: 29.9 pg (ref 26.0–34.0)
MCHC: 32.6 g/dL (ref 30.0–36.0)
MCV: 91.9 fL (ref 80.0–100.0)
Monocytes Absolute: 0.4 10*3/uL (ref 0.1–1.0)
Monocytes Relative: 7 %
Neutro Abs: 1.4 10*3/uL — ABNORMAL LOW (ref 1.7–7.7)
Neutrophils Relative %: 24 %
Platelets: 20 10*3/uL — CL (ref 150–400)
RBC: 2.84 MIL/uL — ABNORMAL LOW (ref 4.22–5.81)
RDW: 19.9 % — ABNORMAL HIGH (ref 11.5–15.5)
Smear Review: DECREASED
WBC: 6 10*3/uL (ref 4.0–10.5)
nRBC: 0.3 % — ABNORMAL HIGH (ref 0.0–0.2)

## 2018-06-29 MED ORDER — ROMIPLOSTIM INJECTION 500 MCG
5.0000 ug/kg | Freq: Once | SUBCUTANEOUS | Status: AC
  Administered 2018-06-29: 11:00:00 340 ug via SUBCUTANEOUS
  Filled 2018-06-29: qty 0.68

## 2018-06-29 MED ORDER — EPOETIN ALFA 40000 UNIT/ML IJ SOLN
40000.0000 [IU] | Freq: Once | INTRAMUSCULAR | Status: AC
  Administered 2018-06-29: 11:00:00 40000 [IU] via SUBCUTANEOUS

## 2018-07-03 ENCOUNTER — Inpatient Hospital Stay: Payer: Medicare Other | Attending: Oncology

## 2018-07-03 ENCOUNTER — Other Ambulatory Visit: Payer: Self-pay

## 2018-07-03 DIAGNOSIS — I129 Hypertensive chronic kidney disease with stage 1 through stage 4 chronic kidney disease, or unspecified chronic kidney disease: Secondary | ICD-10-CM | POA: Insufficient documentation

## 2018-07-03 DIAGNOSIS — C9110 Chronic lymphocytic leukemia of B-cell type not having achieved remission: Secondary | ICD-10-CM

## 2018-07-03 DIAGNOSIS — I2510 Atherosclerotic heart disease of native coronary artery without angina pectoris: Secondary | ICD-10-CM | POA: Diagnosis not present

## 2018-07-03 DIAGNOSIS — R5383 Other fatigue: Secondary | ICD-10-CM | POA: Diagnosis not present

## 2018-07-03 DIAGNOSIS — N183 Chronic kidney disease, stage 3 (moderate): Secondary | ICD-10-CM | POA: Diagnosis not present

## 2018-07-03 DIAGNOSIS — Z87891 Personal history of nicotine dependence: Secondary | ICD-10-CM | POA: Insufficient documentation

## 2018-07-03 DIAGNOSIS — I6523 Occlusion and stenosis of bilateral carotid arteries: Secondary | ICD-10-CM | POA: Insufficient documentation

## 2018-07-03 DIAGNOSIS — Z79899 Other long term (current) drug therapy: Secondary | ICD-10-CM | POA: Insufficient documentation

## 2018-07-03 DIAGNOSIS — D693 Immune thrombocytopenic purpura: Secondary | ICD-10-CM | POA: Diagnosis not present

## 2018-07-03 DIAGNOSIS — D631 Anemia in chronic kidney disease: Secondary | ICD-10-CM | POA: Diagnosis not present

## 2018-07-03 DIAGNOSIS — E1122 Type 2 diabetes mellitus with diabetic chronic kidney disease: Secondary | ICD-10-CM | POA: Insufficient documentation

## 2018-07-03 LAB — CBC WITH DIFFERENTIAL/PLATELET
Abs Immature Granulocytes: 0 10*3/uL (ref 0.00–0.07)
Basophils Absolute: 0.1 10*3/uL (ref 0.0–0.1)
Basophils Relative: 1 %
Eosinophils Absolute: 0.1 10*3/uL (ref 0.0–0.5)
Eosinophils Relative: 2 %
HCT: 26.2 % — ABNORMAL LOW (ref 39.0–52.0)
Hemoglobin: 8.5 g/dL — ABNORMAL LOW (ref 13.0–17.0)
Lymphocytes Relative: 65 %
Lymphs Abs: 4 10*3/uL (ref 0.7–4.0)
MCH: 30.1 pg (ref 26.0–34.0)
MCHC: 32.4 g/dL (ref 30.0–36.0)
MCV: 92.9 fL (ref 80.0–100.0)
Monocytes Absolute: 0.2 10*3/uL (ref 0.1–1.0)
Monocytes Relative: 4 %
Neutro Abs: 1.7 10*3/uL (ref 1.7–7.7)
Neutrophils Relative %: 28 %
Platelets: 19 10*3/uL — CL (ref 150–400)
RBC: 2.82 MIL/uL — ABNORMAL LOW (ref 4.22–5.81)
RDW: 19.8 % — ABNORMAL HIGH (ref 11.5–15.5)
Smear Review: NORMAL
WBC Morphology: ABNORMAL
WBC: 6.2 10*3/uL (ref 4.0–10.5)
nRBC: 0 % (ref 0.0–0.2)

## 2018-07-05 NOTE — Progress Notes (Signed)
[name]  Telephone:(336) [phone] Fax:(336) [phone]  ID: [name] OB: [birth year]  MR#: [id number]  LNL#:[id number]  Patient Care Team: [name], MD as PCP - General (Family Medicine) Lloyd Huger, MD as Consulting Physician (Oncology) Clent Jacks, MD as Consulting Physician (Ophthalmology) Dorothy Spark, MD as Consulting Physician (Cardiology) Madelon Lips, MD as Consulting Physician (Nephrology)  CHIEF COMPLAINT: 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  INTERVAL HISTORY: Patient returns to clinic today for repeat laboratory, further evaluation, and continuation of Nplate and Procrit.  He had a nosebleed over the weekend that resolved with direct pressure.  Patient did not seek medical care.  He has chronic weakness and fatigue. He continues to have easy bruising.  He denies any dizziness or other neurologic complaints. He has a good appetite and denies weight loss. He denies any night sweats. He has noted no lymphadenopathy.  He has no chest pain, shortness of breath, cough, or hemoptysis.  He denies any nausea, vomiting, constipation, or diarrhea. He has no urinary complaints.  Patient offers no further specific complaints today.  REVIEW OF SYSTEMS:   Review of Systems  Constitutional: Positive for malaise/fatigue. Negative for fever and weight loss.  HENT: Positive for nosebleeds.   Respiratory: Negative.  Negative for cough and shortness of breath.   Cardiovascular: Negative.  Negative for chest pain and leg swelling.  Gastrointestinal: Negative.  Negative for abdominal pain, blood in stool and melena.  Genitourinary: Negative.  Negative for dysuria and hematuria.  Musculoskeletal: Negative.  Negative for back pain and neck pain.  Skin: Negative.  Negative for itching and rash.  Neurological: Positive for weakness. Negative for dizziness, sensory change, focal weakness and headaches.  Endo/Heme/Allergies: Bruises/bleeds  easily.  Psychiatric/Behavioral: Negative.  The patient is not nervous/anxious.     As per HPI. Otherwise, a complete review of systems is negative.  PAST MEDICAL HISTORY: Past Medical History:  Diagnosis Date   Acquired hand deformity 1962   hand saw accident at work   Anemia 10/11/2011   Arthritis    Bradycardia 10/10/2011   CAD (coronary artery disease) 10/10/2011   MI s/p PTCA (Dx-OM2 proximal concentric stenosis)   CKD (chronic kidney disease) stage 3, GFR 30-59 ml/min (HCC)    [name]   CLL (chronic lymphocytic leukemia) ([place])    Colon polyps    Generalized headaches    frequent   GI bleed 07/08/2017   Glaucoma    s/p laser surgery   HLD (hyperlipidemia)    Hypertension    Hypothyroidism 10/10/2011   Thrombocytopenia ([place]) 10/11/2011   Type 2 diabetes with nephropathy ([place])    DM refresher course [facility] (04/2013)    PAST SURGICAL HISTORY: Past Surgical History:  Procedure Laterality Date   BACK SURGERY     cervical neck   CATARACT EXTRACTION, BILATERAL Bilateral 2017   COLONOSCOPY  11/2008   1 polyp, diverticulosis, rec rpt 5 yrs (Dr. Oletta Lamas, [name])   COLONOSCOPY  06/2014   hyperplastic polyp, rpt 5 yrs ([facility])   COLONOSCOPY WITH PROPOFOL N/A 11/17/2017   TA, HP, (Vanga, Tally Due, MD)   ESOPHAGOGASTRODUODENOSCOPY (EGD) WITH PROPOFOL N/A 11/17/2017   healing erosive gastritis, intestinal metaplasia, neg H pylori (Vanga, Tally Due, MD)   EYE SURGERY  2012   laser surgery for glaucoma   PTCA  1994, 1995   US ECHOCARDIOGRAPHY  10/2013   inferior wall hypokinesis, mild LVH, EF 50-55%, mild MR and LA dilation    FAMILY HISTORY: Family  History  Problem Relation Age of Onset   Diabetes Sister    Stomach cancer Sister 28   Pneumonia Father        caused death   Other Brother        no communication with brother so unsure of any health conditions   Coronary artery disease Son 73       5v CABG and stents   Hyperlipidemia Sister     Stroke Neg Hx    Heart attack Neg Hx     ADVANCED DIRECTIVES (Y/N):  N  HEALTH MAINTENANCE: Social History   Tobacco Use   Smoking status: Former Smoker    Last attempt to quit: 03/04/1978    Years since quitting: 40.3   Smokeless tobacco: Never Used  Substance Use Topics   Alcohol use: No   Drug use: No     Colonoscopy:  PAP:  Bone density:  Lipid panel:  No Known Allergies  Current Outpatient Medications  Medication Sig Dispense Refill   acetaminophen (TYLENOL) 650 MG CR tablet Take 650 mg by mouth every 8 (eight) hours as needed for pain.     atorvastatin (LIPITOR) 80 MG tablet Take 80 mg by mouth daily.     carvedilol (COREG) 6.25 MG tablet Take 1 tablet (6.25 mg total) by mouth 2 (two) times daily. Please make annual appt for future refills. Thank you 180 tablet 0   glucose blood (ONE TOUCH ULTRA TEST) test strip 1 each by Other route 4 (four) times daily. and as needed to check sugars Dx code:  E11.21 400 each 3   insulin aspart (NOVOLOG FLEXPEN) 100 UNIT/ML FlexPen Take 5 units with meals, take 10 units if mealtime sugar >250 15 mL 11   Insulin Detemir (LEVEMIR FLEXTOUCH) 100 UNIT/ML Pen INJECT 25 UNITS SUBCUTANEOUSLY ONCE DAILY AT  10PM 15 pen 3   levothyroxine (SYNTHROID) 88 MCG tablet Take 1 tablet (88 mcg total) by mouth daily. 90 tablet 1   nitroGLYCERIN (NITROSTAT) 0.4 MG SL tablet Place 1 tablet (0.4 mg total) under the tongue every 5 (five) minutes as needed for chest pain ((MAX of 3 doses)). 25 tablet 0   traZODone (DESYREL) 100 MG tablet Take 1 tablet (100 mg total) by mouth at bedtime as needed for sleep. 90 tablet 1   No current facility-administered medications for this visit.     OBJECTIVE: Vitals:   07/06/18 0836  BP: 106/60  Pulse: 67  Resp: 18  Temp: (!) 95 F (35 C)     Body mass index is 20.48 kg/m.    ECOG FS:0 - Asymptomatic  General: Well-developed, well-nourished, no acute distress. Eyes: Pink conjunctiva, anicteric  sclera. HEENT: Normocephalic, moist mucous membranes. Lungs: Clear to auscultation bilaterally. Heart: Regular rate and rhythm. No rubs, murmurs, or gallops. Abdomen: Soft, nontender, nondistended. No organomegaly noted, normoactive bowel sounds. Musculoskeletal: No edema, cyanosis, or clubbing. Neuro: Alert, answering all questions appropriately. Cranial nerves grossly intact. Skin: No rashes or petechiae noted. Psych: Normal affect.  LAB RESULTS:  Lab Results  Component Value Date   NA 142 06/24/2018   K 5.3 No hemolysis seen (H) 06/24/2018   CL 108 06/24/2018   CO2 28 06/24/2018   GLUCOSE 205 (H) 06/24/2018   BUN 50 (H) 06/24/2018   CREATININE 1.48 06/24/2018   CALCIUM 9.8 06/24/2018   PROT 6.3 (L) 04/29/2018   ALBUMIN 4.1 06/24/2018   AST 28 04/29/2018   ALT 16 04/29/2018   ALKPHOS 100 04/29/2018   BILITOT 1.1  04/29/2018   GFRNONAA 45 (L) 05/30/2018   GFRAA 53 (L) 05/30/2018    Lab Results  Component Value Date   WBC 6.2 07/03/2018   NEUTROABS 1.7 07/03/2018   HGB 8.5 (L) 07/03/2018   HCT 26.2 (L) 07/03/2018   MCV 92.9 07/03/2018   PLT 19 (LL) 07/03/2018     STUDIES: No results found.  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  PLAN:    1. CLL: Bone marrow biopsy completed on October 23, 2017 reviewed independently with involvement with CLL, but unclear percentage.  Although patient was reported to have normal cytogenetics in his bone marrow sample, peripheral blood FISH testing 50% incidence of mutation in the ATM gene which is commonly associated with deletion 11q. 11q- is associated with an unfavorable prognosis and high risk of not responding to initial treatment. 13q- is a more common mutation and is actually associated with a more favorable prognosis. Patient's white blood cell count continues to be within normal limits.  He received his last infusion of single agent Rituxan on September 03, 2017. If patient requires additional treatment  could possibly transition him to [place].   2.  Thrombocytopenia: Patient platelet count is decreased and is now 19. 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  Patient has received high-dose prednisone, IVIG, and Rituxan with no appreciable durability to improve his platelet count.  Increase Nplate dose to 6 mcg/kg today.  Continue weekly Nplate and Procrit and return to clinic in 2 weeks for further evaluation. 3.  Anemia: Patient's hemoglobin stable and he does not require transfusion today.  Proceed with 40,000 units Procrit. He previously reported black tarry stools, but none recently.  Patient had colonoscopy on November 17, 2017 that removed 2 nonmalignant polyps.  EGD on the same day revealed nonbleeding erosive gastropathy with multiple nonbleeding duodenal ulcers.  Consider referral back to GI after COVID-19.  Return to clinic in 2 weeks as above.  We also discussed the possibility of repeating a bone marrow biopsy once the COVID-19 pandemic resolves.  4.  Reaction to Rituxan: Rate-based.  Patient will require premedications for any further infusions with Rituxan.   5.  Nosebleeds/easy bruising: Secondary to thrombocytopenia.  Monitor.  I spent a total of 30 minutes face-to-face with the patient of which greater than 50% of the visit was spent in counseling and coordination of care as detailed above.  Patient expressed understanding and was in agreement with this plan. He also understands that He can call clinic at any time with any questions, concerns, or complaints.    Lloyd Huger, MD   07/06/2018 12:36 PM

## 2018-07-06 ENCOUNTER — Inpatient Hospital Stay: Payer: Medicare Other

## 2018-07-06 ENCOUNTER — Other Ambulatory Visit: Payer: Self-pay

## 2018-07-06 ENCOUNTER — Encounter: Payer: Self-pay | Admitting: Oncology

## 2018-07-06 ENCOUNTER — Inpatient Hospital Stay (HOSPITAL_BASED_OUTPATIENT_CLINIC_OR_DEPARTMENT_OTHER): Payer: Medicare Other | Admitting: Oncology

## 2018-07-06 VITALS — BP 106/60 | HR 67 | Temp 95.0°F | Resp 18 | Wt 148.9 lb

## 2018-07-06 DIAGNOSIS — I6523 Occlusion and stenosis of bilateral carotid arteries: Secondary | ICD-10-CM

## 2018-07-06 DIAGNOSIS — D693 Immune thrombocytopenic purpura: Secondary | ICD-10-CM

## 2018-07-06 DIAGNOSIS — N183 Chronic kidney disease, stage 3 (moderate): Secondary | ICD-10-CM | POA: Diagnosis not present

## 2018-07-06 DIAGNOSIS — C9110 Chronic lymphocytic leukemia of B-cell type not having achieved remission: Secondary | ICD-10-CM

## 2018-07-06 DIAGNOSIS — R5383 Other fatigue: Secondary | ICD-10-CM | POA: Diagnosis not present

## 2018-07-06 DIAGNOSIS — D631 Anemia in chronic kidney disease: Secondary | ICD-10-CM | POA: Diagnosis not present

## 2018-07-06 MED ORDER — EPOETIN ALFA 40000 UNIT/ML IJ SOLN
40000.0000 [IU] | Freq: Once | INTRAMUSCULAR | Status: AC
  Administered 2018-07-06: 40000 [IU] via SUBCUTANEOUS
  Filled 2018-07-06: qty 1

## 2018-07-06 MED ORDER — ROMIPLOSTIM INJECTION 500 MCG
6.0000 ug/kg | Freq: Once | SUBCUTANEOUS | Status: AC
  Administered 2018-07-06: 405 ug via SUBCUTANEOUS
  Filled 2018-07-06: qty 0.81

## 2018-07-06 NOTE — Progress Notes (Signed)
Patient here today for follow up regarding CLL and thrombocytopenia. Patient reports nosebleed on Friday.

## 2018-07-12 ENCOUNTER — Other Ambulatory Visit: Payer: Self-pay

## 2018-07-13 ENCOUNTER — Other Ambulatory Visit: Payer: Self-pay | Admitting: Oncology

## 2018-07-13 ENCOUNTER — Other Ambulatory Visit: Payer: Self-pay

## 2018-07-13 ENCOUNTER — Other Ambulatory Visit: Payer: Self-pay | Admitting: *Deleted

## 2018-07-13 ENCOUNTER — Inpatient Hospital Stay: Payer: Medicare Other

## 2018-07-13 VITALS — BP 119/58

## 2018-07-13 DIAGNOSIS — C9110 Chronic lymphocytic leukemia of B-cell type not having achieved remission: Secondary | ICD-10-CM

## 2018-07-13 DIAGNOSIS — D649 Anemia, unspecified: Secondary | ICD-10-CM

## 2018-07-13 DIAGNOSIS — D693 Immune thrombocytopenic purpura: Secondary | ICD-10-CM

## 2018-07-13 DIAGNOSIS — D631 Anemia in chronic kidney disease: Secondary | ICD-10-CM | POA: Diagnosis not present

## 2018-07-13 DIAGNOSIS — R5383 Other fatigue: Secondary | ICD-10-CM | POA: Diagnosis not present

## 2018-07-13 DIAGNOSIS — I6523 Occlusion and stenosis of bilateral carotid arteries: Secondary | ICD-10-CM | POA: Diagnosis not present

## 2018-07-13 DIAGNOSIS — N183 Chronic kidney disease, stage 3 (moderate): Secondary | ICD-10-CM | POA: Diagnosis not present

## 2018-07-13 LAB — CBC WITH DIFFERENTIAL/PLATELET
Abs Immature Granulocytes: 0.06 10*3/uL (ref 0.00–0.07)
Basophils Absolute: 0 10*3/uL (ref 0.0–0.1)
Basophils Relative: 0 %
Eosinophils Absolute: 0.1 10*3/uL (ref 0.0–0.5)
Eosinophils Relative: 2 %
HCT: 21.2 % — ABNORMAL LOW (ref 39.0–52.0)
Hemoglobin: 6.8 g/dL — ABNORMAL LOW (ref 13.0–17.0)
Immature Granulocytes: 1 %
Lymphocytes Relative: 66 %
Lymphs Abs: 3.3 10*3/uL (ref 0.7–4.0)
MCH: 30.1 pg (ref 26.0–34.0)
MCHC: 32.1 g/dL (ref 30.0–36.0)
MCV: 93.8 fL (ref 80.0–100.0)
Monocytes Absolute: 0.4 10*3/uL (ref 0.1–1.0)
Monocytes Relative: 9 %
Neutro Abs: 1.1 10*3/uL — ABNORMAL LOW (ref 1.7–7.7)
Neutrophils Relative %: 22 %
Platelets: 38 10*3/uL — ABNORMAL LOW (ref 150–400)
RBC: 2.26 MIL/uL — ABNORMAL LOW (ref 4.22–5.81)
RDW: 20.8 % — ABNORMAL HIGH (ref 11.5–15.5)
Smear Review: NORMAL
WBC: 5 10*3/uL (ref 4.0–10.5)
nRBC: 0 % (ref 0.0–0.2)

## 2018-07-13 LAB — SAMPLE TO BLOOD BANK

## 2018-07-13 MED ORDER — EPOETIN ALFA 40000 UNIT/ML IJ SOLN
40000.0000 [IU] | Freq: Once | INTRAMUSCULAR | Status: AC
  Administered 2018-07-13: 11:00:00 40000 [IU] via SUBCUTANEOUS

## 2018-07-13 MED ORDER — ROMIPLOSTIM INJECTION 500 MCG
6.0000 ug/kg | Freq: Once | SUBCUTANEOUS | Status: AC
  Administered 2018-07-13: 405 ug via SUBCUTANEOUS
  Filled 2018-07-13: qty 0.81

## 2018-07-15 ENCOUNTER — Other Ambulatory Visit: Payer: Self-pay

## 2018-07-15 ENCOUNTER — Inpatient Hospital Stay: Payer: Medicare Other | Attending: Oncology

## 2018-07-15 DIAGNOSIS — Z87891 Personal history of nicotine dependence: Secondary | ICD-10-CM | POA: Insufficient documentation

## 2018-07-15 DIAGNOSIS — D696 Thrombocytopenia, unspecified: Secondary | ICD-10-CM | POA: Insufficient documentation

## 2018-07-15 DIAGNOSIS — R5382 Chronic fatigue, unspecified: Secondary | ICD-10-CM | POA: Insufficient documentation

## 2018-07-15 DIAGNOSIS — E1122 Type 2 diabetes mellitus with diabetic chronic kidney disease: Secondary | ICD-10-CM | POA: Diagnosis not present

## 2018-07-15 DIAGNOSIS — C9110 Chronic lymphocytic leukemia of B-cell type not having achieved remission: Secondary | ICD-10-CM | POA: Diagnosis not present

## 2018-07-15 DIAGNOSIS — Z833 Family history of diabetes mellitus: Secondary | ICD-10-CM | POA: Diagnosis not present

## 2018-07-15 DIAGNOSIS — Z8249 Family history of ischemic heart disease and other diseases of the circulatory system: Secondary | ICD-10-CM | POA: Insufficient documentation

## 2018-07-15 DIAGNOSIS — N183 Chronic kidney disease, stage 3 (moderate): Secondary | ICD-10-CM | POA: Diagnosis not present

## 2018-07-15 DIAGNOSIS — Z800 Family history of malignant neoplasm of digestive organs: Secondary | ICD-10-CM | POA: Insufficient documentation

## 2018-07-15 DIAGNOSIS — I129 Hypertensive chronic kidney disease with stage 1 through stage 4 chronic kidney disease, or unspecified chronic kidney disease: Secondary | ICD-10-CM | POA: Insufficient documentation

## 2018-07-15 DIAGNOSIS — R531 Weakness: Secondary | ICD-10-CM | POA: Diagnosis not present

## 2018-07-15 DIAGNOSIS — D649 Anemia, unspecified: Secondary | ICD-10-CM | POA: Insufficient documentation

## 2018-07-15 LAB — PREPARE RBC (CROSSMATCH)

## 2018-07-15 MED ORDER — ACETAMINOPHEN 325 MG PO TABS
650.0000 mg | ORAL_TABLET | Freq: Once | ORAL | Status: AC
  Administered 2018-07-15: 650 mg via ORAL
  Filled 2018-07-15: qty 2

## 2018-07-15 MED ORDER — SODIUM CHLORIDE 0.9% IV SOLUTION
250.0000 mL | Freq: Once | INTRAVENOUS | Status: AC
  Administered 2018-07-15: 10:00:00 250 mL via INTRAVENOUS
  Filled 2018-07-15: qty 250

## 2018-07-15 MED ORDER — DIPHENHYDRAMINE HCL 50 MG/ML IJ SOLN
25.0000 mg | Freq: Once | INTRAMUSCULAR | Status: AC
  Administered 2018-07-15: 10:00:00 25 mg via INTRAVENOUS
  Filled 2018-07-15: qty 1

## 2018-07-16 LAB — TYPE AND SCREEN
ABO/RH(D): A POS
Antibody Screen: NEGATIVE
Donor AG Type: NEGATIVE
Donor AG Type: NEGATIVE
Unit division: 0
Unit division: 0

## 2018-07-16 LAB — BPAM RBC
Blood Product Expiration Date: 202005152359
Blood Product Expiration Date: 202005182359
ISSUE DATE / TIME: 202005131009
ISSUE DATE / TIME: 202005131225
Unit Type and Rh: 5100
Unit Type and Rh: 5100

## 2018-07-17 ENCOUNTER — Inpatient Hospital Stay: Payer: Medicare Other

## 2018-07-17 ENCOUNTER — Other Ambulatory Visit: Payer: Self-pay

## 2018-07-17 DIAGNOSIS — C9110 Chronic lymphocytic leukemia of B-cell type not having achieved remission: Secondary | ICD-10-CM

## 2018-07-17 DIAGNOSIS — D631 Anemia in chronic kidney disease: Secondary | ICD-10-CM | POA: Diagnosis not present

## 2018-07-17 DIAGNOSIS — N183 Chronic kidney disease, stage 3 (moderate): Secondary | ICD-10-CM | POA: Diagnosis not present

## 2018-07-17 DIAGNOSIS — I6523 Occlusion and stenosis of bilateral carotid arteries: Secondary | ICD-10-CM | POA: Diagnosis not present

## 2018-07-17 DIAGNOSIS — R5383 Other fatigue: Secondary | ICD-10-CM | POA: Diagnosis not present

## 2018-07-17 DIAGNOSIS — D693 Immune thrombocytopenic purpura: Secondary | ICD-10-CM | POA: Diagnosis not present

## 2018-07-17 LAB — CBC WITH DIFFERENTIAL/PLATELET
Abs Immature Granulocytes: 0.09 10*3/uL — ABNORMAL HIGH (ref 0.00–0.07)
Basophils Absolute: 0 10*3/uL (ref 0.0–0.1)
Basophils Relative: 1 %
Eosinophils Absolute: 0.1 10*3/uL (ref 0.0–0.5)
Eosinophils Relative: 2 %
HCT: 28 % — ABNORMAL LOW (ref 39.0–52.0)
Hemoglobin: 9.3 g/dL — ABNORMAL LOW (ref 13.0–17.0)
Immature Granulocytes: 1 %
Lymphocytes Relative: 66 %
Lymphs Abs: 4.7 10*3/uL — ABNORMAL HIGH (ref 0.7–4.0)
MCH: 29.7 pg (ref 26.0–34.0)
MCHC: 33.2 g/dL (ref 30.0–36.0)
MCV: 89.5 fL (ref 80.0–100.0)
Monocytes Absolute: 0.6 10*3/uL (ref 0.1–1.0)
Monocytes Relative: 8 %
Neutro Abs: 1.6 10*3/uL — ABNORMAL LOW (ref 1.7–7.7)
Neutrophils Relative %: 22 %
Platelets: 36 10*3/uL — ABNORMAL LOW (ref 150–400)
RBC: 3.13 MIL/uL — ABNORMAL LOW (ref 4.22–5.81)
RDW: 19.4 % — ABNORMAL HIGH (ref 11.5–15.5)
Smear Review: NORMAL
WBC: 7.1 10*3/uL (ref 4.0–10.5)
nRBC: 0 % (ref 0.0–0.2)

## 2018-07-18 NOTE — Progress Notes (Signed)
Scott [name]  Telephone:(336) [phone] Fax:(336) [phone]  ID: [name] OB: 12/28/1940  MR#: [id number]  VEH#:[id number]  Patient Care Team: [name], MD as PCP - General (Family Medicine) Lloyd Huger, MD as Consulting Physician (Oncology) Clent Jacks, MD as Consulting Physician (Ophthalmology) Dorothy Spark, MD as Consulting Physician (Cardiology) Madelon Lips, MD as Consulting Physician (Nephrology)  CHIEF COMPLAINT: 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  INTERVAL HISTORY: Patient returns to clinic today for repeat laboratory work, further evaluation, and continuation of Nplate and Procrit.  He continues to have chronic weakness and fatigue, but otherwise feels well.  He does not complain of any nosebleeds this week. He continues to have easy bruising.  He denies any dizziness or other neurologic complaints. He has a good appetite and denies weight loss. He denies any night sweats. He has noted no lymphadenopathy.  He has no chest pain, shortness of breath, cough, or hemoptysis.  He denies any nausea, vomiting, constipation, or diarrhea. He has no urinary complaints.  Patient offers no further specific complaints today.  REVIEW OF SYSTEMS:   Review of Systems  Constitutional: Positive for malaise/fatigue. Negative for fever and weight loss.  HENT: Negative.  Negative for nosebleeds.   Respiratory: Negative.  Negative for cough and shortness of breath.   Cardiovascular: Negative.  Negative for chest pain and leg swelling.  Gastrointestinal: Negative.  Negative for abdominal pain, blood in stool and melena.  Genitourinary: Negative.  Negative for dysuria and hematuria.  Musculoskeletal: Negative.  Negative for back pain and neck pain.  Skin: Negative.  Negative for itching and rash.  Neurological: Positive for weakness. Negative for dizziness, sensory change, focal weakness and headaches.  Endo/Heme/Allergies: Bruises/bleeds easily.   Psychiatric/Behavioral: Negative.  The patient is not nervous/anxious.     As per HPI. Otherwise, a complete review of systems is negative.  PAST MEDICAL HISTORY: Past Medical History:  Diagnosis Date   Acquired hand deformity 1962   hand saw accident at work   Anemia 10/11/2011   Arthritis    Bradycardia 10/10/2011   CAD (coronary artery disease) 10/10/2011   MI s/p PTCA (Dx-OM2 proximal concentric stenosis)   CKD (chronic kidney disease) stage 3, GFR 30-59 ml/min (HCC)    [name]   CLL (chronic lymphocytic leukemia) ([place])    Colon polyps    Generalized headaches    frequent   GI bleed 07/08/2017   Glaucoma    s/p laser surgery   HLD (hyperlipidemia)    Hypertension    Hypothyroidism 10/10/2011   Thrombocytopenia ([place]) 10/11/2011   Type 2 diabetes with nephropathy ([place])    DM refresher course [facility] (04/2013)    PAST SURGICAL HISTORY: Past Surgical History:  Procedure Laterality Date   BACK SURGERY     cervical neck   CATARACT EXTRACTION, BILATERAL Bilateral 2017   COLONOSCOPY  11/2008   1 polyp, diverticulosis, rec rpt 5 yrs (Dr. Oletta Lamas, [name])   COLONOSCOPY  06/2014   hyperplastic polyp, rpt 5 yrs ([place])   COLONOSCOPY WITH PROPOFOL N/A 11/17/2017   TA, HP, (Vanga, Tally Due, MD)   ESOPHAGOGASTRODUODENOSCOPY (EGD) WITH PROPOFOL N/A 11/17/2017   healing erosive gastritis, intestinal metaplasia, neg H pylori ([place], Tally Due, MD)   EYE SURGERY  2012   laser surgery for glaucoma   PTCA  1994, 1995   US ECHOCARDIOGRAPHY  10/2013   inferior wall hypokinesis, mild LVH, EF 50-55%, mild MR and LA dilation    FAMILY HISTORY: Family History  Problem Relation Age of Onset   Diabetes Sister    Stomach cancer Sister 38   Pneumonia Father        caused death   Other Brother        no communication with brother so unsure of any health conditions   Coronary artery disease Son 48       5v CABG and stents   Hyperlipidemia Sister    Stroke  Neg Hx    Heart attack Neg Hx     ADVANCED DIRECTIVES (Y/N):  N  HEALTH MAINTENANCE: Social History   Tobacco Use   Smoking status: Former Smoker    Last attempt to quit: 03/04/1978    Years since quitting: 40.4   Smokeless tobacco: Never Used  Substance Use Topics   Alcohol use: No   Drug use: No     Colonoscopy:  PAP:  Bone density:  Lipid panel:  No Known Allergies  Current Outpatient Medications  Medication Sig Dispense Refill   acetaminophen (TYLENOL) 650 MG CR tablet Take 650 mg by mouth every 8 (eight) hours as needed for pain.     atorvastatin (LIPITOR) 80 MG tablet Take 80 mg by mouth daily.     carvedilol (COREG) 6.25 MG tablet Take 1 tablet (6.25 mg total) by mouth 2 (two) times daily. Please make annual appt for future refills. Thank you 180 tablet 0   glucose blood (ONE TOUCH ULTRA TEST) test strip 1 each by Other route 4 (four) times daily. and as needed to check sugars Dx code:  E11.21 400 each 3   insulin aspart (NOVOLOG FLEXPEN) 100 UNIT/ML FlexPen Take 5 units with meals, take 10 units if mealtime sugar >250 15 mL 11   Insulin Detemir (LEVEMIR FLEXTOUCH) 100 UNIT/ML Pen INJECT 25 UNITS SUBCUTANEOUSLY ONCE DAILY AT  10PM 15 pen 3   levothyroxine (SYNTHROID) 88 MCG tablet Take 1 tablet (88 mcg total) by mouth daily. 90 tablet 1   nitroGLYCERIN (NITROSTAT) 0.4 MG SL tablet Place 1 tablet (0.4 mg total) under the tongue every 5 (five) minutes as needed for chest pain ((MAX of 3 doses)). 25 tablet 0   traZODone (DESYREL) 100 MG tablet Take 1 tablet (100 mg total) by mouth at bedtime as needed for sleep. 90 tablet 1   No current facility-administered medications for this visit.     OBJECTIVE: Vitals:   07/20/18 0839  BP: (!) 157/69  Pulse: 65  Resp: 18  Temp: (!) 96 F (35.6 C)     Body mass index is 20.77 kg/m.    ECOG FS:0 - Asymptomatic  General: Well-developed, well-nourished, no acute distress. Eyes: Pink conjunctiva, anicteric  sclera. HEENT: Normocephalic, moist mucous membranes. Lungs: Clear to auscultation bilaterally. Heart: Regular rate and rhythm. No rubs, murmurs, or gallops. Abdomen: Soft, nontender, nondistended. No organomegaly noted, normoactive bowel sounds. Musculoskeletal: No edema, cyanosis, or clubbing. Neuro: Alert, answering all questions appropriately. Cranial nerves grossly intact. Skin: No rashes or petechiae noted. Psych: Normal affect.  LAB RESULTS:  Lab Results  Component Value Date   NA 142 06/24/2018   K 5.3 No hemolysis seen (H) 06/24/2018   CL 108 06/24/2018   CO2 28 06/24/2018   GLUCOSE 205 (H) 06/24/2018   BUN 50 (H) 06/24/2018   CREATININE 1.48 06/24/2018   CALCIUM 9.8 06/24/2018   PROT 6.3 (L) 04/29/2018   ALBUMIN 4.1 06/24/2018   AST 28 04/29/2018   ALT 16 04/29/2018   ALKPHOS 100 04/29/2018   BILITOT 1.1 04/29/2018  GFRNONAA 45 (L) 05/30/2018   GFRAA 53 (L) 05/30/2018    Lab Results  Component Value Date   WBC 7.1 07/17/2018   NEUTROABS 1.6 (L) 07/17/2018   HGB 9.3 (L) 07/17/2018   HCT 28.0 (L) 07/17/2018   MCV 89.5 07/17/2018   PLT 36 (L) 07/17/2018     STUDIES: No results found.  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  PLAN:    1. CLL: Bone marrow biopsy completed on October 23, 2017 reviewed independently with involvement with CLL, but unclear percentage.  Although patient was reported to have normal cytogenetics in his bone marrow sample, peripheral blood FISH testing 50% incidence of mutation in the ATM gene which is commonly associated with deletion 11q. 11q- is associated with an unfavorable prognosis and high risk of not responding to initial treatment. 13q- is a more common mutation and is actually associated with a more favorable prognosis. Patient's white blood cell count continues to be within normal limits.  He received his last infusion of single agent Rituxan on September 03, 2017. If patient requires additional treatment  could possibly transition him to [place].   2.  Thrombocytopenia: Patient's platelet count was decreased, but stable at 36.  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  Patient has received high-dose prednisone, IVIG, and Rituxan with no appreciable durability to improve his platelet count.  Increase Nplate dose to 7 mcg/kg today.  Continue weekly Nplate and Procrit and return to clinic in 2 weeks for further evaluation. 3.  Anemia: Patient's hemoglobin has mildly improved to 9.3 therefore he does not require transfusion today.  Proceed with 40,000 units Procrit. He previously reported black tarry stools, but none recently.  Patient had colonoscopy on November 17, 2017 that removed 2 nonmalignant polyps.  EGD on the same day revealed nonbleeding erosive gastropathy with multiple nonbleeding duodenal ulcers.  Consider referral back to GI after COVID-19.  Patient has also agreed to a repeat bone marrow biopsy in the next 1 to 2 weeks. 4.  Reaction to Rituxan: Rate-based.  Patient will require premedications for any further infusions with Rituxan.   5.  Nosebleeds/easy bruising: Secondary to thrombocytopenia.  Monitor.  I spent a total of 30 minutes face-to-face with the patient of which greater than 50% of the visit was spent in counseling and coordination of care as detailed above.  Patient expressed understanding and was in agreement with this plan. He also understands that He can call clinic at any time with any questions, concerns, or complaints.    Lloyd Huger, MD   07/21/2018 7:06 AM

## 2018-07-20 ENCOUNTER — Encounter: Payer: Self-pay | Admitting: Oncology

## 2018-07-20 ENCOUNTER — Inpatient Hospital Stay (HOSPITAL_BASED_OUTPATIENT_CLINIC_OR_DEPARTMENT_OTHER): Payer: Medicare Other | Admitting: Oncology

## 2018-07-20 ENCOUNTER — Other Ambulatory Visit: Payer: Self-pay

## 2018-07-20 ENCOUNTER — Inpatient Hospital Stay: Payer: Medicare Other

## 2018-07-20 VITALS — BP 157/69 | HR 65 | Temp 96.0°F | Resp 18 | Wt 151.0 lb

## 2018-07-20 DIAGNOSIS — D693 Immune thrombocytopenic purpura: Secondary | ICD-10-CM

## 2018-07-20 DIAGNOSIS — C9110 Chronic lymphocytic leukemia of B-cell type not having achieved remission: Secondary | ICD-10-CM | POA: Diagnosis not present

## 2018-07-20 DIAGNOSIS — R5383 Other fatigue: Secondary | ICD-10-CM | POA: Diagnosis not present

## 2018-07-20 DIAGNOSIS — Z87891 Personal history of nicotine dependence: Secondary | ICD-10-CM

## 2018-07-20 DIAGNOSIS — N183 Chronic kidney disease, stage 3 (moderate): Secondary | ICD-10-CM | POA: Diagnosis not present

## 2018-07-20 DIAGNOSIS — I6523 Occlusion and stenosis of bilateral carotid arteries: Secondary | ICD-10-CM

## 2018-07-20 DIAGNOSIS — D649 Anemia, unspecified: Secondary | ICD-10-CM

## 2018-07-20 DIAGNOSIS — D631 Anemia in chronic kidney disease: Secondary | ICD-10-CM | POA: Diagnosis not present

## 2018-07-20 MED ORDER — ROMIPLOSTIM INJECTION 500 MCG
7.0000 ug/kg | Freq: Once | SUBCUTANEOUS | Status: AC
  Administered 2018-07-20: 480 ug via SUBCUTANEOUS
  Filled 2018-07-20: qty 0.96

## 2018-07-20 MED ORDER — EPOETIN ALFA 40000 UNIT/ML IJ SOLN
40000.0000 [IU] | Freq: Once | INTRAMUSCULAR | Status: AC
  Administered 2018-07-20: 40000 [IU] via SUBCUTANEOUS
  Filled 2018-07-20: qty 1

## 2018-07-20 NOTE — Progress Notes (Signed)
Patient denies any concerns today.  

## 2018-07-21 ENCOUNTER — Telehealth: Payer: Self-pay | Admitting: *Deleted

## 2018-07-21 NOTE — Telephone Encounter (Signed)
Call placed to patient regarding updates to appointments. Patient is scheduled for CT guided bone marrow biopsy on Tuesday 5/26 at 8:30, patient to arrive at 7:30. Patient is scheduled for lab/nplate/procrit now on 5/27 at 9:15. Patient verbalized understanding of all appointment changes and details.

## 2018-07-24 ENCOUNTER — Other Ambulatory Visit: Payer: Self-pay | Admitting: Student

## 2018-07-28 ENCOUNTER — Other Ambulatory Visit (HOSPITAL_COMMUNITY)
Admission: RE | Admit: 2018-07-28 | Disposition: A | Discharge status: Home / Self Care | Payer: Medicare Other | Source: Ambulatory Visit | Attending: Oncology | Admitting: Oncology

## 2018-07-28 ENCOUNTER — Ambulatory Visit
Admission: RE | Admit: 2018-07-28 | Discharge: 2018-07-28 | Disposition: A | Discharge status: Home / Self Care | Payer: Medicare Other | Source: Ambulatory Visit | Attending: Oncology | Admitting: Oncology

## 2018-07-28 ENCOUNTER — Other Ambulatory Visit: Payer: Self-pay

## 2018-07-28 ENCOUNTER — Ambulatory Visit: Payer: Medicare Other

## 2018-07-28 ENCOUNTER — Other Ambulatory Visit: Payer: Medicare Other

## 2018-07-28 DIAGNOSIS — I2510 Atherosclerotic heart disease of native coronary artery without angina pectoris: Secondary | ICD-10-CM | POA: Diagnosis not present

## 2018-07-28 DIAGNOSIS — Z7901 Long term (current) use of anticoagulants: Secondary | ICD-10-CM | POA: Diagnosis not present

## 2018-07-28 DIAGNOSIS — E039 Hypothyroidism, unspecified: Secondary | ICD-10-CM | POA: Diagnosis not present

## 2018-07-28 DIAGNOSIS — Z79899 Other long term (current) drug therapy: Secondary | ICD-10-CM | POA: Insufficient documentation

## 2018-07-28 DIAGNOSIS — I129 Hypertensive chronic kidney disease with stage 1 through stage 4 chronic kidney disease, or unspecified chronic kidney disease: Secondary | ICD-10-CM | POA: Insufficient documentation

## 2018-07-28 DIAGNOSIS — Z794 Long term (current) use of insulin: Secondary | ICD-10-CM | POA: Insufficient documentation

## 2018-07-28 DIAGNOSIS — Z8249 Family history of ischemic heart disease and other diseases of the circulatory system: Secondary | ICD-10-CM | POA: Insufficient documentation

## 2018-07-28 DIAGNOSIS — M1990 Unspecified osteoarthritis, unspecified site: Secondary | ICD-10-CM | POA: Insufficient documentation

## 2018-07-28 DIAGNOSIS — C9110 Chronic lymphocytic leukemia of B-cell type not having achieved remission: Secondary | ICD-10-CM | POA: Diagnosis not present

## 2018-07-28 DIAGNOSIS — D649 Anemia, unspecified: Secondary | ICD-10-CM | POA: Diagnosis not present

## 2018-07-28 DIAGNOSIS — D696 Thrombocytopenia, unspecified: Secondary | ICD-10-CM | POA: Insufficient documentation

## 2018-07-28 DIAGNOSIS — E785 Hyperlipidemia, unspecified: Secondary | ICD-10-CM | POA: Diagnosis not present

## 2018-07-28 DIAGNOSIS — Z79890 Hormone replacement therapy (postmenopausal): Secondary | ICD-10-CM | POA: Insufficient documentation

## 2018-07-28 DIAGNOSIS — E1122 Type 2 diabetes mellitus with diabetic chronic kidney disease: Secondary | ICD-10-CM | POA: Insufficient documentation

## 2018-07-28 DIAGNOSIS — Z87891 Personal history of nicotine dependence: Secondary | ICD-10-CM | POA: Diagnosis not present

## 2018-07-28 DIAGNOSIS — N183 Chronic kidney disease, stage 3 (moderate): Secondary | ICD-10-CM | POA: Insufficient documentation

## 2018-07-28 DIAGNOSIS — Z800 Family history of malignant neoplasm of digestive organs: Secondary | ICD-10-CM | POA: Diagnosis not present

## 2018-07-28 DIAGNOSIS — Z833 Family history of diabetes mellitus: Secondary | ICD-10-CM | POA: Diagnosis not present

## 2018-07-28 LAB — CBC WITH DIFFERENTIAL/PLATELET
Abs Immature Granulocytes: 0.12 10*3/uL — ABNORMAL HIGH (ref 0.00–0.07)
Basophils Absolute: 0 10*3/uL (ref 0.0–0.1)
Basophils Relative: 1 %
Eosinophils Absolute: 0.1 10*3/uL (ref 0.0–0.5)
Eosinophils Relative: 2 %
HCT: 25.1 % — ABNORMAL LOW (ref 39.0–52.0)
Hemoglobin: 8.1 g/dL — ABNORMAL LOW (ref 13.0–17.0)
Immature Granulocytes: 2 %
Lymphocytes Relative: 63 %
Lymphs Abs: 3.8 10*3/uL (ref 0.7–4.0)
MCH: 29.9 pg (ref 26.0–34.0)
MCHC: 32.3 g/dL (ref 30.0–36.0)
MCV: 92.6 fL (ref 80.0–100.0)
Monocytes Absolute: 0.4 10*3/uL (ref 0.1–1.0)
Monocytes Relative: 6 %
Neutro Abs: 1.5 10*3/uL — ABNORMAL LOW (ref 1.7–7.7)
Neutrophils Relative %: 26 %
Platelets: 21 10*3/uL — CL (ref 150–400)
RBC: 2.71 MIL/uL — ABNORMAL LOW (ref 4.22–5.81)
RDW: 18.9 % — ABNORMAL HIGH (ref 11.5–15.5)
Smear Review: NORMAL
WBC Morphology: ABNORMAL
WBC: 6 10*3/uL (ref 4.0–10.5)
nRBC: 0 % (ref 0.0–0.2)

## 2018-07-28 LAB — PROTIME-INR
INR: 1 (ref 0.8–1.2)
Prothrombin Time: 13.3 seconds (ref 11.4–15.2)

## 2018-07-28 LAB — GLUCOSE, CAPILLARY: Glucose-Capillary: 199 mg/dL — ABNORMAL HIGH (ref 70–99)

## 2018-07-28 MED ORDER — MIDAZOLAM HCL 2 MG/2ML IJ SOLN
INTRAMUSCULAR | Status: AC | PRN
  Administered 2018-07-28: 1 mg via INTRAVENOUS

## 2018-07-28 MED ORDER — FENTANYL CITRATE (PF) 100 MCG/2ML IJ SOLN
INTRAMUSCULAR | Status: AC | PRN
  Administered 2018-07-28: 50 ug via INTRAVENOUS

## 2018-07-28 MED ORDER — HEPARIN SOD (PORK) LOCK FLUSH 100 UNIT/ML IV SOLN
INTRAVENOUS | Status: AC
  Filled 2018-07-28: qty 5

## 2018-07-28 MED ORDER — MIDAZOLAM HCL 5 MG/5ML IJ SOLN
INTRAMUSCULAR | Status: AC
  Filled 2018-07-28: qty 5

## 2018-07-28 MED ORDER — SODIUM CHLORIDE 0.9 % IV SOLN
INTRAVENOUS | Status: DC
  Administered 2018-07-28: 08:00:00 via INTRAVENOUS

## 2018-07-28 MED ORDER — FENTANYL CITRATE (PF) 100 MCG/2ML IJ SOLN
INTRAMUSCULAR | Status: AC
  Filled 2018-07-28: qty 4

## 2018-07-28 NOTE — Procedures (Signed)
Interventional Radiology Procedure Note  Procedure: CT guided aspirate and core biopsy of left posterior iliac bone Complications: None Recommendations: - Bedrest supine x 1 hrs - OTC's PRN  Pain - Follow biopsy results  Signed,  Dima Ferrufino S. Brookelynn Hamor, DO    

## 2018-07-28 NOTE — H&P (Signed)
Chief Complaint: Patient was seen in consultation today for No chief complaint on file.  at the request of Scott [name],Scott [name]  Referring Physician(s): Scott [name],Scott [name]  Supervising Physician: Scott [name]  Patient Status: Scott [name] - Out-pt  History of Present Illness: Scott [name] is a 78 y.o. male presenting for bone marrow biopsy.   Denies any recent symptoms of cough, fever, rigors, chills.    Past Medical History:  Diagnosis Date  . Acquired hand deformity 1962   hand saw accident at work  . Anemia 10/11/2011  . Arthritis   . Bradycardia 10/10/2011  . CAD (coronary artery disease) 10/10/2011   MI s/p PTCA (Dx-OM2 proximal concentric stenosis)  . CKD (chronic kidney disease) stage 3, GFR 30-59 ml/min (HCC)    [name]  . CLL (chronic lymphocytic leukemia) ([place])   . Colon polyps   . Generalized headaches    frequent  . GI bleed 07/08/2017  . Glaucoma    s/p laser surgery  . HLD (hyperlipidemia)   . Hypertension   . Hypothyroidism 10/10/2011  . Thrombocytopenia (Scott [name]) 10/11/2011  . Type 2 diabetes with nephropathy [facility])    DM refresher course [facility] (04/2013)    Past Surgical History:  Procedure Laterality Date  . BACK SURGERY     cervical neck  . CATARACT EXTRACTION, BILATERAL Bilateral 2017  . COLONOSCOPY  11/2008   1 polyp, diverticulosis, rec rpt 5 yrs (Dr. Oletta [name], Scott [name])  . COLONOSCOPY  06/2014   hyperplastic polyp, rpt 5 yrs (Scott [name])  . COLONOSCOPY WITH PROPOFOL N/A 11/17/2017   TA, HP, (Scott, Tally Due, [name])  . ESOPHAGOGASTRODUODENOSCOPY (EGD) WITH PROPOFOL N/A 11/17/2017   healing erosive gastritis, intestinal metaplasia, neg H pylori (Scott, Tally Due, [name])  . EYE SURGERY  2012   laser surgery for glaucoma  . PTCA  1994, 1995  . US ECHOCARDIOGRAPHY  10/2013   inferior wall hypokinesis, mild LVH, EF 50-55%, mild Scott and Scott dilation    Allergies: Patient has no known allergies.  Medications: Prior to Admission medications   Medication Sig Start  Date End Date Taking? Authorizing Provider  acetaminophen (TYLENOL) 650 MG CR tablet Take 650 mg by mouth every 8 (eight) hours as needed for pain.   Yes Provider, Historical, [name]  atorvastatin (LIPITOR) 80 MG tablet Take 80 mg by mouth daily.   Yes Provider, Historical, [name]  carvedilol (COREG) 6.25 MG tablet Take 1 tablet (6.25 mg total) by mouth 2 (two) times daily. Please make annual appt for future refills. Thank you 05/20/18  Yes Dorothy Spark, [name]  insulin aspart (NOVOLOG FLEXPEN) 100 UNIT/ML FlexPen Take 5 units with meals, take 10 units if mealtime sugar >250 06/11/18  Yes Scott Bush, [name]  Insulin Detemir (LEVEMIR FLEXTOUCH) 100 UNIT/ML Pen INJECT 25 UNITS SUBCUTANEOUSLY ONCE DAILY AT  10PM 01/22/18  Yes Scott Bush, [name]  levothyroxine (SYNTHROID) 88 MCG tablet Take 1 tablet (88 mcg total) by mouth daily. 06/26/18  Yes Scott Bush, [name]  traZODone (DESYREL) 100 MG tablet Take 1 tablet (100 mg total) by mouth at bedtime as needed for sleep. 06/11/18  Yes Scott Bush, [name]  glucose blood (ONE TOUCH ULTRA TEST) test strip 1 each by Other route 4 (four) times daily. and as needed to check sugars Dx code:  E11.21 09/12/17   Scott Bush, [name]  nitroGLYCERIN (NITROSTAT) 0.4 MG SL tablet Place 1 tablet (0.4 mg total) under the tongue every 5 (five) minutes as needed for chest pain ((MAX of 3 doses)). 02/27/18  Scott Bush, [name]     Family History  Problem Relation Age of Onset  . Diabetes Sister   . Stomach cancer Sister 51  . Pneumonia Father        caused death  . Other Brother        no communication with brother so unsure of any health conditions  . Coronary artery disease Son 3       5v CABG and stents  . Hyperlipidemia Sister   . Stroke Neg Hx   . Heart attack Neg Hx     Social History   Socioeconomic History  . Marital status: Widowed    Spouse name: Not on file  . Number of children: Not on file  . Years of education: Not on file  . Highest education  level: Not on file  Occupational History  . Not on file  Social Needs  . Financial resource strain: Not on file  . Food insecurity:    Worry: Not on file    Inability: Not on file  . Transportation needs:    Medical: Not on file    Non-medical: Not on file  Tobacco Use  . Smoking status: Former Smoker    Last attempt to quit: 03/04/1978    Years since quitting: 40.4  . Smokeless tobacco: Never Used  Substance and Sexual Activity  . Alcohol use: No  . Drug use: No  . Sexual activity: Never  Lifestyle  . Physical activity:    Days per week: Not on file    Minutes per session: Not on file  . Stress: Not on file  Relationships  . Social connections:    Talks on phone: Not on file    Gets together: Not on file    Attends religious service: Not on file    Active member of club or organization: Not on file    Attends meetings of clubs or organizations: Not on file    Relationship status: Not on file  Other Topics Concern  . Not on file  Social History Narrative   Widower. Wife passed 08/2015 from cancer. 1 dog   Occupation: retired, was route Hotel manager   Edu: 11th gr   Activity: walks dog (pomeranian) 3-4x/day, yardwork, rides bicycle.   Diet: drinks diet coke, no vegetables       Review of Systems: A 12 point ROS discussed and pertinent positives are indicated in the HPI above.  All other systems are negative.  Review of Systems  Vital Signs: BP (!) 167/67   Pulse 61   Temp 97.7 F (36.5 C) (Oral)   Resp 19   Ht '5\' 11"'  (1.803 m)   Wt 68 kg   SpO2 100%   BMI 20.92 kg/m   Physical Exam General: 78 yo male appearing  stated age.  Well-developed, well-nourished.  No distress. HEENT: Atraumatic, normocephalic.  Conjugate gaze, extra-ocular motor intact. No scleral icterus or scleral injection. No lesions on external ears, nose, lips, or gums.  Oral mucosa moist, pink.  Neck: Symmetric with no goiter enlargement.  Chest/Lungs:  Symmetric chest with  inspiration/expiration.  No labored breathing.  Clear to auscultation with no wheezes, rhonchi, or rales.  Heart:  RRR, with no third heart sounds appreciated. No JVD appreciated.  Abdomen:  Soft, NT/ND, with + bowel sounds.   Genito-urinary: Deferred Neurologic: Alert & Oriented to person, place, and time.   Normal affect and insight.  Appropriate questions.  Moving all 4 extremities with gross sensory intact.  Imaging: No results found.  Labs:  CBC: Recent Labs    06/29/18 0853 07/03/18 1003 07/13/18 0957 07/17/18 1004  WBC 6.0 6.2 5.0 7.1  HGB 8.5* 8.5* 6.8* 9.3*  HCT 26.1* 26.2* 21.2* 28.0*  PLT 20* 19* 38* 36*    COAGS: Recent Labs    08/23/17 1855 10/23/17 0747 05/30/18 1003  INR 0.98 1.06 1.0  APTT  --  35  --     BMP: Recent Labs    08/23/17 1855  02/27/18 1040 04/29/18 0809 05/30/18 1003 06/24/18 1015  NA 140   < > 144 144 144 142  K 4.8   < > 5.0 4.5 4.3 5.3 No hemolysis seen*  CL 105   < > 109 110 109 108  CO2 24   < > '25 28 26 28  ' GLUCOSE 324*   < > 145* 125* 88 205*  BUN 33*   < > 38* 41* 43* 50*  CALCIUM 9.5   < > 9.9 9.4 9.5 9.8  CREATININE 1.75*   < > 1.62* 1.40* 1.47* 1.48  GFRNONAA 36*  --   --  48* 45*  --   GFRAA 42*  --   --  56* 53*  --    < > = values in this interval not displayed.    LIVER FUNCTION TESTS: Recent Labs    08/23/17 1855 02/20/18 1011 02/27/18 1040 04/29/18 0809 06/24/18 1015  BILITOT 0.8 1.1  --  1.1  --   AST 15 23  --  28  --   ALT 14* 13  --  16  --   ALKPHOS 65 61  --  100  --   PROT 6.3* 6.8  --  6.3*  --   ALBUMIN 3.8 4.3 4.2 3.5 4.1    TUMOR MARKERS: No results for input(s): AFPTM, CEA, CA199, CHROMGRNA in the last 8760 hours.  Assessment and Plan:  Scott [name] is a 78 yo male presenting for bone marrow biopsy.   Risks and benefits of CT guided bx was discussed with the patient   including, but not limited to bleeding, infection, damage to adjacent structures or low yield requiring additional  tests.  All of the questions were answered and there is agreement to proceed.  Consent signed and in chart. \ Thank you for this interesting consult.  I greatly enjoyed meeting KMARI HALTER and look forward to participating in their care.  A copy of this report was sent to the requesting provider on this date.  Electronically Signed: Corrie Mckusick, DO 07/28/2018, 8:30 AM   I spent a total of  40 Minutes   in face to face in clinical consultation, greater than 50% of which was counseling/coordinating care for bone marrow biopsy.

## 2018-07-28 NOTE — Discharge Instructions (Signed)
Bone Marrow Aspiration and Bone Marrow Biopsy, Adult, Care After This sheet gives you information about how to care for yourself after your procedure. If you have problems or questions, contact your health care provider.  What can I expect after the procedure?  After the procedure, it is common to have:  Mild pain and tenderness.  Swelling.  Bruising.  Follow these instructions at home:  Take over-the-counter or prescription medicines only as told by your health care provider.  You may shower tomorrow ? Remove band aid tomorrow, replace with site has any drainage from biopsy site. ? Wash your hands with soap and water before you touch your biopsy site  If soap and water are not available, use hand sanitizer. ? Change your dressing frequently for bleeding and/or drainage.  Check your puncture site every day for signs of infection. Check for: ? More redness, swelling, or pain. ? More fluid or blood. ? Warmth. ? Pus or a bad smell.  Return to your normal activities in 24hours.   Do not drive for 24 hours if you were given a medicine to help you relax (sedative).  Keep all follow-up visits as told by your health care provider. This is important. Contact a health care provider if:  You have more redness, swelling, or pain around the puncture site.  You have more fluid or blood coming from the puncture site.  Your puncture site feels warm to the touch.  You have pus or a bad smell coming from the puncture site.  You have a fever.  Your pain is not controlled with medicine. This information is not intended to replace advice given to you by your health care provider. Make sure you discuss any questions you have with your health care provider. Document Released: 09/07/2004 Document Revised: 09/08/2015 Document Reviewed: 08/02/2015 Elsevier Interactive Patient Education  2018 [employer].

## 2018-07-28 NOTE — Progress Notes (Signed)
Patient with BMB per DR Earleen Newport, tolerated procedure well. Vitals remained stable throughout entire procedure. Sinus brady per monitor. Received versed 2mg  iv/fentanyl 100 mcg iv for procedure. Dozing at intervals during entire procedure. Transferred to specials for remainder of recovery prior to discharge.

## 2018-07-29 ENCOUNTER — Inpatient Hospital Stay: Payer: Medicare Other

## 2018-07-29 ENCOUNTER — Other Ambulatory Visit: Payer: Self-pay

## 2018-07-29 VITALS — BP 118/62 | HR 59

## 2018-07-29 DIAGNOSIS — C9110 Chronic lymphocytic leukemia of B-cell type not having achieved remission: Secondary | ICD-10-CM

## 2018-07-29 DIAGNOSIS — D693 Immune thrombocytopenic purpura: Secondary | ICD-10-CM

## 2018-07-29 DIAGNOSIS — D631 Anemia in chronic kidney disease: Secondary | ICD-10-CM | POA: Diagnosis not present

## 2018-07-29 DIAGNOSIS — R5383 Other fatigue: Secondary | ICD-10-CM | POA: Diagnosis not present

## 2018-07-29 DIAGNOSIS — N183 Chronic kidney disease, stage 3 (moderate): Secondary | ICD-10-CM | POA: Diagnosis not present

## 2018-07-29 DIAGNOSIS — I6523 Occlusion and stenosis of bilateral carotid arteries: Secondary | ICD-10-CM | POA: Diagnosis not present

## 2018-07-29 DIAGNOSIS — D649 Anemia, unspecified: Secondary | ICD-10-CM

## 2018-07-29 MED ORDER — ROMIPLOSTIM INJECTION 500 MCG
7.0000 ug/kg | Freq: Once | SUBCUTANEOUS | Status: AC
  Administered 2018-07-29: 475 ug via SUBCUTANEOUS
  Filled 2018-07-29: qty 0.95

## 2018-07-29 MED ORDER — EPOETIN ALFA 40000 UNIT/ML IJ SOLN
40000.0000 [IU] | Freq: Once | INTRAMUSCULAR | Status: AC
  Administered 2018-07-29: 40000 [IU] via SUBCUTANEOUS

## 2018-07-31 ENCOUNTER — Other Ambulatory Visit: Payer: Self-pay

## 2018-07-31 ENCOUNTER — Inpatient Hospital Stay: Payer: Medicare Other

## 2018-07-31 DIAGNOSIS — D631 Anemia in chronic kidney disease: Secondary | ICD-10-CM | POA: Diagnosis not present

## 2018-07-31 DIAGNOSIS — C9110 Chronic lymphocytic leukemia of B-cell type not having achieved remission: Secondary | ICD-10-CM

## 2018-07-31 DIAGNOSIS — I6523 Occlusion and stenosis of bilateral carotid arteries: Secondary | ICD-10-CM | POA: Diagnosis not present

## 2018-07-31 DIAGNOSIS — D693 Immune thrombocytopenic purpura: Secondary | ICD-10-CM | POA: Diagnosis not present

## 2018-07-31 DIAGNOSIS — R5383 Other fatigue: Secondary | ICD-10-CM | POA: Diagnosis not present

## 2018-07-31 DIAGNOSIS — N183 Chronic kidney disease, stage 3 (moderate): Secondary | ICD-10-CM | POA: Diagnosis not present

## 2018-07-31 LAB — CBC WITH DIFFERENTIAL/PLATELET
Abs Immature Granulocytes: 0 10*3/uL (ref 0.00–0.07)
Band Neutrophils: 1 %
Basophils Absolute: 0 10*3/uL (ref 0.0–0.1)
Basophils Relative: 0 %
Eosinophils Absolute: 0.2 10*3/uL (ref 0.0–0.5)
Eosinophils Relative: 4 %
HCT: 23.9 % — ABNORMAL LOW (ref 39.0–52.0)
Hemoglobin: 7.6 g/dL — ABNORMAL LOW (ref 13.0–17.0)
Lymphocytes Relative: 61 %
Lymphs Abs: 3.4 10*3/uL (ref 0.7–4.0)
MCH: 29.6 pg (ref 26.0–34.0)
MCHC: 31.8 g/dL (ref 30.0–36.0)
MCV: 93 fL (ref 80.0–100.0)
Monocytes Absolute: 0.3 10*3/uL (ref 0.1–1.0)
Monocytes Relative: 5 %
Neutro Abs: 1.7 10*3/uL (ref 1.7–7.7)
Neutrophils Relative %: 29 %
Platelets: 27 10*3/uL — CL (ref 150–400)
RBC: 2.57 MIL/uL — ABNORMAL LOW (ref 4.22–5.81)
RDW: 19 % — ABNORMAL HIGH (ref 11.5–15.5)
Smear Review: DECREASED
WBC Morphology: ABNORMAL
WBC: 5.5 10*3/uL (ref 4.0–10.5)
nRBC: 0 % (ref 0.0–0.2)

## 2018-07-31 NOTE — Progress Notes (Signed)
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  INTERVAL HISTORY: Patient returns to clinic today for repeat laboratory work, further evaluation, discussion of his bone marrow biopsy results, and continuation of Nplate and Procrit.  He continues to have chronic weakness and fatigue, but otherwise feels well.  He continues to have easy bruising, but denies any bleeding.  He denies any recent fevers or illnesses.  He denies any dizziness or other neurologic complaints. He has a good appetite and denies weight loss. He denies any night sweats. He has noted no lymphadenopathy.  He has no chest pain, shortness of breath, cough, or hemoptysis.  He denies any nausea, vomiting, constipation, or diarrhea. He has no urinary complaints.  Patient offers no further specific complaints today.    REVIEW OF SYSTEMS:   Review of Systems  Constitutional: Positive for malaise/fatigue. Negative for fever and weight loss.  HENT: Negative.  Negative for nosebleeds.   Respiratory: Negative.  Negative for cough and shortness of breath.   Cardiovascular: Negative.  Negative for chest pain and leg swelling.  Gastrointestinal: Negative.  Negative for abdominal pain, blood in stool and melena.  Genitourinary: Negative.  Negative for dysuria and hematuria.  Musculoskeletal: Negative.  Negative for back pain and neck pain.  Skin: Negative.  Negative for itching and rash.  Neurological: Positive for weakness. Negative for dizziness, sensory change, focal  weakness and headaches.  Endo/Heme/Allergies: Bruises/bleeds easily.  Psychiatric/Behavioral: Negative.  The patient is not nervous/anxious.     As per HPI. Otherwise, a complete review of systems is negative.  PAST MEDICAL HISTORY: Past Medical History:  Diagnosis Date   Acquired hand deformity 1962   hand saw accident at work   Anemia 10/11/2011   Arthritis    Bradycardia 10/10/2011   CAD (coronary artery disease) 10/10/2011   MI s/p PTCA (Dx-OM2 proximal concentric stenosis)   CKD (chronic kidney disease) stage 3, GFR 30-59 ml/min (HCC)    [name]   CLL (chronic lymphocytic leukemia) ([name])    Colon polyps    Generalized headaches    frequent   GI bleed 07/08/2017   Glaucoma    s/p laser surgery   HLD (hyperlipidemia)    Hypertension    Hypothyroidism 10/10/2011   Thrombocytopenia ([name]) 10/11/2011   Type 2 diabetes with nephropathy ([name])    DM refresher course [facility] (04/2013)    PAST SURGICAL HISTORY: Past Surgical History:  Procedure Laterality Date   BACK SURGERY     cervical neck   CATARACT EXTRACTION, BILATERAL Bilateral 2017   COLONOSCOPY  11/2008   1 polyp, diverticulosis, rec rpt 5 yrs (Dr. Oletta Lamas, [name])   COLONOSCOPY  06/2014   hyperplastic polyp, rpt 5 yrs ([place])   COLONOSCOPY WITH PROPOFOL N/A 11/17/2017   TA, HP, ([place], Tally Due, MD)   ESOPHAGOGASTRODUODENOSCOPY (EGD) WITH PROPOFOL N/A 11/17/2017   healing erosive gastritis, intestinal metaplasia, neg H pylori ([place], Tally Due, MD)   EYE SURGERY  2012   laser surgery for glaucoma   PTCA  1994, 1995   US ECHOCARDIOGRAPHY  10/2013   inferior wall hypokinesis, mild LVH, EF 50-55%,  mild MR and LA dilation    FAMILY HISTORY: Family History  Problem Relation Age of Onset   Diabetes Sister    Stomach cancer Sister 59   Pneumonia Father        caused death   Other Brother        no communication with brother so unsure of any health conditions   Coronary artery disease Son  74       5v CABG and stents   Hyperlipidemia Sister    Stroke Neg Hx    Heart attack Neg Hx     ADVANCED DIRECTIVES (Y/N):  N  HEALTH MAINTENANCE: Social History   Tobacco Use   Smoking status: Former Smoker    Last attempt to quit: 03/04/1978    Years since quitting: 40.4   Smokeless tobacco: Never Used  Substance Use Topics   Alcohol use: No   Drug use: No     Colonoscopy:  PAP:  Bone density:  Lipid panel:  No Known Allergies  Current Outpatient Medications  Medication Sig Dispense Refill   acetaminophen (TYLENOL) 650 MG CR tablet Take 650 mg by mouth every 8 (eight) hours as needed for pain.     atorvastatin (LIPITOR) 80 MG tablet Take 80 mg by mouth daily.     carvedilol (COREG) 6.25 MG tablet Take 1 tablet (6.25 mg total) by mouth 2 (two) times daily. Please make annual appt for future refills. Thank you 180 tablet 0   glucose blood (ONE TOUCH ULTRA TEST) test strip 1 each by Other route 4 (four) times daily. and as needed to check sugars Dx code:  E11.21 400 each 3   insulin aspart (NOVOLOG FLEXPEN) 100 UNIT/ML FlexPen Take 5 units with meals, take 10 units if mealtime sugar >250 15 mL 11   Insulin Detemir (LEVEMIR FLEXTOUCH) 100 UNIT/ML Pen INJECT 25 UNITS SUBCUTANEOUSLY ONCE DAILY AT  10PM 15 pen 3   levothyroxine (SYNTHROID) 88 MCG tablet Take 1 tablet (88 mcg total) by mouth daily. 90 tablet 1   nitroGLYCERIN (NITROSTAT) 0.4 MG SL tablet Place 1 tablet (0.4 mg total) under the tongue every 5 (five) minutes as needed for chest pain ((MAX of 3 doses)). 25 tablet 0   traZODone (DESYREL) 100 MG tablet Take 1 tablet (100 mg total) by mouth at bedtime as needed for sleep. 90 tablet 1   No current facility-administered medications for this visit.     OBJECTIVE: Vitals:   08/03/18 0834  BP: 124/69  Pulse: 68  Temp: 98.7 F (37.1 C)     Body mass index is 20.92 kg/m.    ECOG FS:0 - Asymptomatic  General: Well-developed, well-nourished, no acute  distress. Eyes: Pink conjunctiva, anicteric sclera. HEENT: Normocephalic, moist mucous membranes. Lungs: Clear to auscultation bilaterally. Heart: Regular rate and rhythm. No rubs, murmurs, or gallops. Abdomen: Soft, nontender, nondistended. No organomegaly noted, normoactive bowel sounds. Musculoskeletal: No edema, cyanosis, or clubbing. Neuro: Alert, answering all questions appropriately. Cranial nerves grossly intact. Skin: No rashes or petechiae noted. Psych: Normal affect.  LAB RESULTS:  Lab Results  Component Value Date   NA 142 06/24/2018   K 5.3 No hemolysis seen (H) 06/24/2018   CL 108 06/24/2018   CO2 28 06/24/2018   GLUCOSE 205 (H) 06/24/2018   BUN 50 (H) 06/24/2018   CREATININE 1.48 06/24/2018   CALCIUM 9.8 06/24/2018   PROT 6.3 (L) 04/29/2018   ALBUMIN 4.1 06/24/2018   AST 28 04/29/2018   ALT 16 04/29/2018  ALKPHOS 100 04/29/2018   BILITOT 1.1 04/29/2018   GFRNONAA 45 (L) 05/30/2018   GFRAA 53 (L) 05/30/2018    Lab Results  Component Value Date   WBC 5.5 07/31/2018   NEUTROABS 1.7 07/31/2018   HGB 7.6 (L) 07/31/2018   HCT 23.9 (L) 07/31/2018   MCV 93.0 07/31/2018   PLT 27 (LL) 07/31/2018     STUDIES: Ct Bone Marrow Biopsy & Aspiration  Result Date: 07/28/2018 INDICATION: 78 year old male with CLL EXAM: CT BONE MARROW BIOPSY AND ASPIRATION MEDICATIONS: None. ANESTHESIA/SEDATION: Moderate (conscious) sedation was employed during this procedure. A total of Versed 2.0 mg and Fentanyl 100 mcg was administered intravenously. Moderate Sedation Time: 12 minutes. The patient's level of consciousness and vital signs were monitored continuously by radiology nursing throughout the procedure under my direct supervision. FLUOROSCOPY TIME:  CT COMPLICATIONS: None PROCEDURE: The procedure risks, benefits, and alternatives were explained to the patient. Questions regarding the procedure were encouraged and answered. The patient understands and consents to the procedure.  Scout CT of the pelvis was performed for surgical planning purposes. The posterior pelvis was prepped with Chlorhexidine in a sterile fashion, and a sterile drape was applied covering the operative field. A sterile gown and sterile gloves were used for the procedure. Local anesthesia was provided with 1% Lidocaine. Left posterior iliac bone was targeted for biopsy. The skin and subcutaneous tissues were infiltrated with 1% lidocaine without epinephrine. A small stab incision was made with an 11 blade scalpel, and an 11 gauge Murphy needle was advanced with CT guidance to the posterior cortex. Manual forced was used to advance the needle through the posterior cortex and the stylet was removed. A bone marrow aspirate was retrieved and passed to a cytotechnologist in the room. The Murphy needle was then advanced without the stylet for a core biopsy. The core biopsy was retrieved and also passed to a cytotechnologist. Manual pressure was used for hemostasis and a sterile dressing was placed. No complications were encountered no significant blood loss was encountered. Patient tolerated the procedure well and remained hemodynamically stable throughout. IMPRESSION: Status post CT-guided bone marrow biopsy, with tissue specimen sent to pathology for complete histopathologic analysis Signed, Dulcy Fanny. Earleen Newport, DO [facility] Radiology Electronically Signed   By: Corrie Mckusick D.O.   On: 07/28/2018 10:02    ASSESSMENT: CLL with ATM (11q-) mutation and 13q-, anemia, ITP.  PLAN:    1. CLL: Repeat bone marrow biopsy on Jul 28, 2018 reviewed independently with 73% involvement of CLL.  Previous bone marrow biopsy on October 23, 2017 revealed only 31% involvement.  Previously, peripheral blood FISH testing 50% incidence of mutation in the ATM gene which is commonly associated with deletion 11q. 11q- is associated with an unfavorable prognosis and high risk of not responding to  initial treatment. 13q- is a more common mutation and is actually associated with a more favorable prognosis. Patient's white blood cell count continues to be within normal limits.  Patient has clear progression of disease in his bone marrow along with a worsening transfusion requirement, will plan to retreat with single agent Rituxan x4 weeks.  At which point can consider adding and Treanda for more durable response.  Return to clinic in 1 week for cycle 1 of 4 of weekly Rituxan. 2.  Thrombocytopenia: Patient's platelet count is 27 today.  Patient has received high-dose prednisone, IVIG, and Rituxan with no appreciable durability to improve his platelet count.  Proceed with 7 mcg/kg Nplate  today.  Return to clinic in 1 week for Rituxan. 3.  Anemia: Patient's hemoglobin is trending down is now 7.6.  Proceed with 40,000 units Procrit. He previously reported black tarry stools, but none recently.  Patient had colonoscopy on November 17, 2017 that removed 2 nonmalignant polyps.  EGD on the same day revealed nonbleeding erosive gastropathy with multiple nonbleeding duodenal ulcers.  Consider referral back to GI after COVID-19.  Repeat bone marrow biopsy as above. 4.  Reaction to Rituxan: Rate-based.  Patient will require premedications for any further infusions with Rituxan.  These have been entered into his treatment plan. 5.  Nosebleeds/easy bruising: Secondary to thrombocytopenia.  Monitor.  Patient expressed understanding and was in agreement with this plan. He also understands that He can call clinic at any time with any questions, concerns, or complaints.    Lloyd Huger, MD   08/03/2018 9:14 AM

## 2018-08-03 ENCOUNTER — Encounter: Payer: Self-pay | Admitting: Oncology

## 2018-08-03 ENCOUNTER — Encounter (HOSPITAL_COMMUNITY): Payer: Self-pay | Admitting: Oncology

## 2018-08-03 ENCOUNTER — Other Ambulatory Visit: Payer: Self-pay

## 2018-08-03 ENCOUNTER — Inpatient Hospital Stay: Payer: Medicare Other

## 2018-08-03 ENCOUNTER — Inpatient Hospital Stay: Payer: Medicare Other | Attending: Oncology | Admitting: Oncology

## 2018-08-03 VITALS — BP 124/69 | HR 68 | Temp 98.7°F | Ht 71.0 in | Wt 150.0 lb

## 2018-08-03 DIAGNOSIS — Z833 Family history of diabetes mellitus: Secondary | ICD-10-CM | POA: Diagnosis not present

## 2018-08-03 DIAGNOSIS — R195 Other fecal abnormalities: Secondary | ICD-10-CM | POA: Insufficient documentation

## 2018-08-03 DIAGNOSIS — Z800 Family history of malignant neoplasm of digestive organs: Secondary | ICD-10-CM | POA: Insufficient documentation

## 2018-08-03 DIAGNOSIS — R5383 Other fatigue: Secondary | ICD-10-CM | POA: Insufficient documentation

## 2018-08-03 DIAGNOSIS — K319 Disease of stomach and duodenum, unspecified: Secondary | ICD-10-CM | POA: Insufficient documentation

## 2018-08-03 DIAGNOSIS — Z5112 Encounter for antineoplastic immunotherapy: Secondary | ICD-10-CM | POA: Diagnosis not present

## 2018-08-03 DIAGNOSIS — Z794 Long term (current) use of insulin: Secondary | ICD-10-CM

## 2018-08-03 DIAGNOSIS — E1122 Type 2 diabetes mellitus with diabetic chronic kidney disease: Secondary | ICD-10-CM

## 2018-08-03 DIAGNOSIS — R531 Weakness: Secondary | ICD-10-CM | POA: Insufficient documentation

## 2018-08-03 DIAGNOSIS — Z8349 Family history of other endocrine, nutritional and metabolic diseases: Secondary | ICD-10-CM

## 2018-08-03 DIAGNOSIS — C9110 Chronic lymphocytic leukemia of B-cell type not having achieved remission: Secondary | ICD-10-CM | POA: Diagnosis not present

## 2018-08-03 DIAGNOSIS — I2510 Atherosclerotic heart disease of native coronary artery without angina pectoris: Secondary | ICD-10-CM | POA: Insufficient documentation

## 2018-08-03 DIAGNOSIS — I129 Hypertensive chronic kidney disease with stage 1 through stage 4 chronic kidney disease, or unspecified chronic kidney disease: Secondary | ICD-10-CM | POA: Diagnosis not present

## 2018-08-03 DIAGNOSIS — Z87891 Personal history of nicotine dependence: Secondary | ICD-10-CM | POA: Diagnosis not present

## 2018-08-03 DIAGNOSIS — D500 Iron deficiency anemia secondary to blood loss (chronic): Secondary | ICD-10-CM | POA: Diagnosis not present

## 2018-08-03 DIAGNOSIS — D630 Anemia in neoplastic disease: Secondary | ICD-10-CM | POA: Diagnosis not present

## 2018-08-03 DIAGNOSIS — D693 Immune thrombocytopenic purpura: Secondary | ICD-10-CM

## 2018-08-03 DIAGNOSIS — N183 Chronic kidney disease, stage 3 (moderate): Secondary | ICD-10-CM | POA: Insufficient documentation

## 2018-08-03 DIAGNOSIS — M1990 Unspecified osteoarthritis, unspecified site: Secondary | ICD-10-CM | POA: Insufficient documentation

## 2018-08-03 DIAGNOSIS — E785 Hyperlipidemia, unspecified: Secondary | ICD-10-CM | POA: Diagnosis not present

## 2018-08-03 DIAGNOSIS — Z8489 Family history of other specified conditions: Secondary | ICD-10-CM

## 2018-08-03 DIAGNOSIS — K269 Duodenal ulcer, unspecified as acute or chronic, without hemorrhage or perforation: Secondary | ICD-10-CM | POA: Diagnosis not present

## 2018-08-03 DIAGNOSIS — D649 Anemia, unspecified: Secondary | ICD-10-CM

## 2018-08-03 DIAGNOSIS — Z79899 Other long term (current) drug therapy: Secondary | ICD-10-CM

## 2018-08-03 MED ORDER — EPOETIN ALFA 40000 UNIT/ML IJ SOLN
40000.0000 [IU] | Freq: Once | INTRAMUSCULAR | Status: AC
  Administered 2018-08-03: 10:00:00 40000 [IU] via SUBCUTANEOUS
  Filled 2018-08-03: qty 1

## 2018-08-03 MED ORDER — ROMIPLOSTIM INJECTION 500 MCG
7.0000 ug/kg | Freq: Once | SUBCUTANEOUS | Status: AC
  Administered 2018-08-03: 475 ug via SUBCUTANEOUS
  Filled 2018-08-03: qty 0.95

## 2018-08-03 NOTE — Progress Notes (Signed)
Patient stated that he had been doing well with no complaints. 

## 2018-08-06 ENCOUNTER — Other Ambulatory Visit: Payer: Self-pay

## 2018-08-07 ENCOUNTER — Inpatient Hospital Stay: Payer: Medicare Other

## 2018-08-07 ENCOUNTER — Other Ambulatory Visit: Payer: Self-pay

## 2018-08-07 DIAGNOSIS — D630 Anemia in neoplastic disease: Secondary | ICD-10-CM | POA: Diagnosis not present

## 2018-08-07 DIAGNOSIS — D693 Immune thrombocytopenic purpura: Secondary | ICD-10-CM | POA: Diagnosis not present

## 2018-08-07 DIAGNOSIS — Z5112 Encounter for antineoplastic immunotherapy: Secondary | ICD-10-CM | POA: Diagnosis not present

## 2018-08-07 DIAGNOSIS — N183 Chronic kidney disease, stage 3 (moderate): Secondary | ICD-10-CM | POA: Diagnosis not present

## 2018-08-07 DIAGNOSIS — I2510 Atherosclerotic heart disease of native coronary artery without angina pectoris: Secondary | ICD-10-CM | POA: Diagnosis not present

## 2018-08-07 DIAGNOSIS — C9110 Chronic lymphocytic leukemia of B-cell type not having achieved remission: Secondary | ICD-10-CM

## 2018-08-07 DIAGNOSIS — D649 Anemia, unspecified: Secondary | ICD-10-CM

## 2018-08-07 LAB — CBC WITH DIFFERENTIAL/PLATELET
Abs Immature Granulocytes: 0.09 10*3/uL — ABNORMAL HIGH (ref 0.00–0.07)
Basophils Absolute: 0 10*3/uL (ref 0.0–0.1)
Basophils Relative: 0 %
Eosinophils Absolute: 0.1 10*3/uL (ref 0.0–0.5)
Eosinophils Relative: 2 %
HCT: 22 % — ABNORMAL LOW (ref 39.0–52.0)
Hemoglobin: 7.1 g/dL — ABNORMAL LOW (ref 13.0–17.0)
Immature Granulocytes: 2 %
Lymphocytes Relative: 55 %
Lymphs Abs: 3.2 10*3/uL (ref 0.7–4.0)
MCH: 30.1 pg (ref 26.0–34.0)
MCHC: 32.3 g/dL (ref 30.0–36.0)
MCV: 93.2 fL (ref 80.0–100.0)
Monocytes Absolute: 0.4 10*3/uL (ref 0.1–1.0)
Monocytes Relative: 7 %
Neutro Abs: 2 10*3/uL (ref 1.7–7.7)
Neutrophils Relative %: 34 %
Platelets: 26 10*3/uL — CL (ref 150–400)
RBC: 2.36 MIL/uL — ABNORMAL LOW (ref 4.22–5.81)
RDW: 19.5 % — ABNORMAL HIGH (ref 11.5–15.5)
Smear Review: NORMAL
WBC: 5.8 10*3/uL (ref 4.0–10.5)
nRBC: 0 % (ref 0.0–0.2)

## 2018-08-07 LAB — SAMPLE TO BLOOD BANK

## 2018-08-09 NOTE — Progress Notes (Signed)
[place]  Telephone:(336) [phone] Fax:(336) [phone]  ID: Scott [name] OB: [birth year]  MR#: [id number]  IZT#:[id number]  Patient Care Team: [name], MD as PCP - General (Family Medicine) Lloyd Huger, MD as Consulting Physician (Oncology) Clent Jacks, MD as Consulting Physician (Ophthalmology) Dorothy Spark, MD as Consulting Physician (Cardiology) Madelon Lips, MD as Consulting Physician (Nephrology)  CHIEF COMPLAINT: 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  INTERVAL HISTORY: Patient returns to clinic today for further evaluation and consideration of cycle 1 of 4 of weekly Rituxan.  He continues to have chronic weakness and fatigue, but otherwise feels well.  He has easy bruising, but denies any bleeding.  He denies any recent fevers or illnesses.  He denies any dizziness or other neurologic complaints. He has a good appetite and denies weight loss. He denies any night sweats. He has noted no lymphadenopathy.  He has no chest pain, shortness of breath, cough, or hemoptysis.  He denies any nausea, vomiting, constipation, or diarrhea. He has no urinary complaints.  Patient offers no further specific complaints today.  REVIEW OF SYSTEMS:   Review of Systems  Constitutional: Positive for malaise/fatigue. Negative for fever and weight loss.  HENT: Negative.  Negative for nosebleeds.   Respiratory: Negative.  Negative for cough and shortness of breath.   Cardiovascular: Negative.  Negative for chest pain and leg swelling.  Gastrointestinal: Negative.  Negative for abdominal pain, blood in stool and melena.  Genitourinary: Negative.  Negative for dysuria and hematuria.  Musculoskeletal: Negative.  Negative for back pain and neck pain.  Skin: Negative.  Negative for itching and rash.  Neurological: Positive for weakness. Negative for dizziness, sensory change, focal weakness and headaches.  Endo/Heme/Allergies: Bruises/bleeds easily.    Psychiatric/Behavioral: Negative.  The patient is not nervous/anxious.78     As per HPI. Otherwise, a complete review of systems is negative.  PAST MEDICAL HISTORY: Past Medical History:  Diagnosis Date   Acquired hand deformity 1962   hand saw accident at work   Anemia 10/11/2011   Arthritis    Bradycardia 10/10/2011   CAD (coronary artery disease) 10/10/2011   MI s/p PTCA (Dx-OM2 proximal concentric stenosis)   CKD (chronic kidney disease) stage 3, GFR 30-59 ml/min (HCC)    [name]   CLL (chronic lymphocytic leukemia) ([place])    Colon polyps    Generalized headaches    frequent   GI bleed 07/08/2017   Glaucoma    s/p laser surgery   HLD (hyperlipidemia)    Hypertension    Hypothyroidism 10/10/2011   Thrombocytopenia ([place]) 10/11/2011   Type 2 diabetes with nephropathy ([place])    DM refresher course [facility] (04/2013)    PAST SURGICAL HISTORY: Past Surgical History:  Procedure Laterality Date   BACK SURGERY     cervical neck   CATARACT EXTRACTION, BILATERAL Bilateral 2017   COLONOSCOPY  11/2008   1 polyp, diverticulosis, rec rpt 5 yrs (Dr. Oletta Lamas, [name])   COLONOSCOPY  06/2014   hyperplastic polyp, rpt 5 yrs ([facility])   COLONOSCOPY WITH PROPOFOL N/A 11/17/2017   TA, HP, (Vanga, Tally Due, MD)   ESOPHAGOGASTRODUODENOSCOPY (EGD) WITH PROPOFOL N/A 11/17/2017   healing erosive gastritis, intestinal metaplasia, neg H pylori (Vanga, Tally Due, MD)   EYE SURGERY  2012   laser surgery for glaucoma   PTCA  1994, 1995   US ECHOCARDIOGRAPHY  10/2013   inferior wall hypokinesis, mild LVH, EF 50-55%, mild MR and LA dilation    FAMILY  HISTORY: Family History  Problem Relation Age of Onset   Diabetes Sister    Stomach cancer Sister 66   Pneumonia Father        caused death   Other Brother        no communication with brother so unsure of any health conditions   Coronary artery disease Son 24       5v CABG and stents   Hyperlipidemia Sister    Stroke  Neg Hx    Heart attack Neg Hx     ADVANCED DIRECTIVES (Y/N):  N  HEALTH MAINTENANCE: Social History   Tobacco Use   Smoking status: Former Smoker    Last attempt to quit: 03/04/1978    Years since quitting: 40.4   Smokeless tobacco: Never Used  Substance Use Topics   Alcohol use: No   Drug use: No     Colonoscopy:  PAP:  Bone density:  Lipid panel:  No Known Allergies  Current Outpatient Medications  Medication Sig Dispense Refill   acetaminophen (TYLENOL) 650 MG CR tablet Take 650 mg by mouth every 8 (eight) hours as needed for pain.     atorvastatin (LIPITOR) 80 MG tablet Take 80 mg by mouth daily.     carvedilol (COREG) 6.25 MG tablet Take 1 tablet (6.25 mg total) by mouth 2 (two) times daily. Please make annual appt for future refills. Thank you 180 tablet 0   glucose blood (ONE TOUCH ULTRA TEST) test strip 1 each by Other route 4 (four) times daily. and as needed to check sugars Dx code:  E11.21 400 each 3   insulin aspart (NOVOLOG FLEXPEN) 100 UNIT/ML FlexPen Take 5 units with meals, take 10 units if mealtime sugar >250 15 mL 11   Insulin Detemir (LEVEMIR FLEXTOUCH) 100 UNIT/ML Pen INJECT 25 UNITS SUBCUTANEOUSLY ONCE DAILY AT  10PM 15 pen 3   levothyroxine (SYNTHROID) 88 MCG tablet Take 1 tablet (88 mcg total) by mouth daily. 90 tablet 1   nitroGLYCERIN (NITROSTAT) 0.4 MG SL tablet Place 1 tablet (0.4 mg total) under the tongue every 5 (five) minutes as needed for chest pain ((MAX of 3 doses)). 25 tablet 0   traZODone (DESYREL) 100 MG tablet Take 1 tablet (100 mg total) by mouth at bedtime as needed for sleep. 90 tablet 1   No current facility-administered medications for this visit.     OBJECTIVE: Vitals:   08/10/18 0842  BP: 126/74  Pulse: 66  Temp: 97.8 F (36.6 C)     Body mass index is 20.6 kg/m.    ECOG FS:0 - Asymptomatic  General: Well-developed, well-nourished, no acute distress. Eyes: Pink conjunctiva, anicteric sclera. HEENT:  Normocephalic, moist mucous membranes. Lungs: Clear to auscultation bilaterally. Heart: Regular rate and rhythm. No rubs, murmurs, or gallops. Abdomen: Soft, nontender, nondistended. No organomegaly noted, normoactive bowel sounds. Musculoskeletal: No edema, cyanosis, or clubbing. Neuro: Alert, answering all questions appropriately. Cranial nerves grossly intact. Skin: No rashes or petechiae noted. Psych: Normal affect.  LAB RESULTS:  Lab Results  Component Value Date   NA 142 06/24/2018   K 5.3 No hemolysis seen (H) 06/24/2018   CL 108 06/24/2018   CO2 28 06/24/2018   GLUCOSE 205 (H) 06/24/2018   BUN 50 (H) 06/24/2018   CREATININE 1.48 06/24/2018   CALCIUM 9.8 06/24/2018   PROT 6.3 (L) 04/29/2018   ALBUMIN 4.1 06/24/2018   AST 28 04/29/2018   ALT 16 04/29/2018   ALKPHOS 100 04/29/2018   BILITOT 1.1 04/29/2018  GFRNONAA 45 (L) 05/30/2018   GFRAA 53 (L) 05/30/2018    Lab Results  Component Value Date   WBC 5.8 08/07/2018   NEUTROABS 2.0 08/07/2018   HGB 7.1 (L) 08/07/2018   HCT 22.0 (L) 08/07/2018   MCV 93.2 08/07/2018   PLT 26 (LL) 08/07/2018     STUDIES: Ct Bone Marrow Biopsy & Aspiration  Result Date: 07/28/2018 INDICATION: 78 year old male with CLL EXAM: CT BONE MARROW BIOPSY AND ASPIRATION MEDICATIONS: None. ANESTHESIA/SEDATION: Moderate (conscious) sedation was employed during this procedure. A total of Versed 2.0 mg and Fentanyl 100 mcg was administered intravenously. Moderate Sedation Time: 12 minutes. The patient's level of consciousness and vital signs were monitored continuously by radiology nursing throughout the procedure under my direct supervision. FLUOROSCOPY TIME:  CT COMPLICATIONS: None PROCEDURE: The procedure risks, benefits, and alternatives were explained to the patient. Questions regarding the procedure were encouraged and answered. The patient understands and consents to the procedure. Scout CT of the pelvis was performed for surgical planning  purposes. The posterior pelvis was prepped with Chlorhexidine in a sterile fashion, and a sterile drape was applied covering the operative field. A sterile gown and sterile gloves were used for the procedure. Local anesthesia was provided with 1% Lidocaine. Left posterior iliac bone was targeted for biopsy. The skin and subcutaneous tissues were infiltrated with 1% lidocaine without epinephrine. A small stab incision was made with an 11 blade scalpel, and an 11 gauge Murphy needle was advanced with CT guidance to the posterior cortex. Manual forced was used to advance the needle through the posterior cortex and the stylet was removed. A bone marrow aspirate was retrieved and passed to a cytotechnologist in the room. The Murphy needle was then advanced without the stylet for a core biopsy. The core biopsy was retrieved and also passed to a cytotechnologist. Manual pressure was used for hemostasis and a sterile dressing was placed. No complications were encountered no significant blood loss was encountered. Patient tolerated the procedure well and remained hemodynamically stable throughout. IMPRESSION: Status post CT-guided bone marrow biopsy, with tissue specimen sent to pathology for complete histopathologic analysis Signed, Dulcy Fanny. Earleen Newport, DO [facility] Radiology Electronically Signed   By: Corrie Mckusick D.O.   On: 07/28/2018 10:02    ASSESSMENT: CLL with ATM (11q-) mutation and 13q-, anemia, ITP.  PLAN:    1. CLL: Repeat bone marrow biopsy on Jul 28, 2018 reviewed independently with 73% involvement of CLL.  Previous bone marrow biopsy on October 23, 2017 revealed only 31% involvement.  Previously, peripheral blood FISH testing 50% incidence of mutation in the ATM gene which is commonly associated with deletion 11q. 11q- is associated with an unfavorable prognosis and high risk of not responding to initial treatment. 13q- is a more common mutation and is  actually associated with a more favorable prognosis. Patient's white blood cell count continues to be within normal limits.  Patient has clear progression of disease in his bone marrow along with a worsening transfusion requirement, will plan to retreat with single agent Rituxan x4 weeks.  At which point can consider adding and Treanda for more durable response.  Proceed with cycle 1 of 4 of weekly Rituxan.  Return to clinic in 1 week for further evaluation and consideration of cycle 2. 2.  Thrombocytopenia: Patient's platelet count is 26 today.  Patient has received high-dose prednisone, IVIG, and Rituxan with no appreciable durability to improve his platelet count.  Rituxan as above.  3.  Anemia: Patient's hemoglobin continues to trend down is now 7.1.  He previously reported black tarry stools, but none recently.  Patient had colonoscopy on November 17, 2017 that removed 2 nonmalignant polyps.  EGD on the same day revealed nonbleeding erosive gastropathy with multiple nonbleeding duodenal ulcers.  Consider referral back to GI after COVID-19.  Rituxan as above.   4.  Reaction to Rituxan: Rate-based.  Patient will require premedications for any further infusions with Rituxan.  These have been entered into his treatment plan. 5.  Nosebleeds/easy bruising: Secondary to thrombocytopenia.  Monitor.  Patient expressed understanding and was in agreement with this plan. He also understands that He can call clinic at any time with any questions, concerns, or complaints.    Lloyd Huger, MD   08/10/2018 9:42 AM

## 2018-08-10 ENCOUNTER — Other Ambulatory Visit: Payer: Self-pay

## 2018-08-10 ENCOUNTER — Other Ambulatory Visit: Payer: Self-pay | Admitting: Oncology

## 2018-08-10 ENCOUNTER — Encounter: Payer: Self-pay | Admitting: Oncology

## 2018-08-10 ENCOUNTER — Inpatient Hospital Stay: Payer: Medicare Other

## 2018-08-10 ENCOUNTER — Inpatient Hospital Stay (HOSPITAL_BASED_OUTPATIENT_CLINIC_OR_DEPARTMENT_OTHER): Payer: Medicare Other | Admitting: Oncology

## 2018-08-10 VITALS — BP 123/64 | HR 71 | Temp 97.0°F | Resp 18

## 2018-08-10 VITALS — BP 126/74 | HR 66 | Temp 97.8°F | Ht 71.0 in | Wt 147.7 lb

## 2018-08-10 DIAGNOSIS — E785 Hyperlipidemia, unspecified: Secondary | ICD-10-CM | POA: Diagnosis not present

## 2018-08-10 DIAGNOSIS — I2510 Atherosclerotic heart disease of native coronary artery without angina pectoris: Secondary | ICD-10-CM | POA: Diagnosis not present

## 2018-08-10 DIAGNOSIS — C9110 Chronic lymphocytic leukemia of B-cell type not having achieved remission: Secondary | ICD-10-CM

## 2018-08-10 DIAGNOSIS — I129 Hypertensive chronic kidney disease with stage 1 through stage 4 chronic kidney disease, or unspecified chronic kidney disease: Secondary | ICD-10-CM

## 2018-08-10 DIAGNOSIS — Z5112 Encounter for antineoplastic immunotherapy: Secondary | ICD-10-CM

## 2018-08-10 DIAGNOSIS — N183 Chronic kidney disease, stage 3 (moderate): Secondary | ICD-10-CM | POA: Diagnosis not present

## 2018-08-10 DIAGNOSIS — Z87891 Personal history of nicotine dependence: Secondary | ICD-10-CM

## 2018-08-10 DIAGNOSIS — M1990 Unspecified osteoarthritis, unspecified site: Secondary | ICD-10-CM

## 2018-08-10 DIAGNOSIS — Z8489 Family history of other specified conditions: Secondary | ICD-10-CM

## 2018-08-10 DIAGNOSIS — D630 Anemia in neoplastic disease: Secondary | ICD-10-CM | POA: Diagnosis not present

## 2018-08-10 DIAGNOSIS — Z800 Family history of malignant neoplasm of digestive organs: Secondary | ICD-10-CM

## 2018-08-10 DIAGNOSIS — Z79899 Other long term (current) drug therapy: Secondary | ICD-10-CM

## 2018-08-10 DIAGNOSIS — R531 Weakness: Secondary | ICD-10-CM | POA: Diagnosis not present

## 2018-08-10 DIAGNOSIS — D693 Immune thrombocytopenic purpura: Secondary | ICD-10-CM | POA: Diagnosis not present

## 2018-08-10 DIAGNOSIS — Z8349 Family history of other endocrine, nutritional and metabolic diseases: Secondary | ICD-10-CM

## 2018-08-10 DIAGNOSIS — E1122 Type 2 diabetes mellitus with diabetic chronic kidney disease: Secondary | ICD-10-CM

## 2018-08-10 DIAGNOSIS — D649 Anemia, unspecified: Secondary | ICD-10-CM

## 2018-08-10 DIAGNOSIS — Z794 Long term (current) use of insulin: Secondary | ICD-10-CM

## 2018-08-10 DIAGNOSIS — Z833 Family history of diabetes mellitus: Secondary | ICD-10-CM

## 2018-08-10 DIAGNOSIS — K319 Disease of stomach and duodenum, unspecified: Secondary | ICD-10-CM

## 2018-08-10 DIAGNOSIS — K269 Duodenal ulcer, unspecified as acute or chronic, without hemorrhage or perforation: Secondary | ICD-10-CM

## 2018-08-10 MED ORDER — FAMOTIDINE IN NACL 20-0.9 MG/50ML-% IV SOLN
20.0000 mg | Freq: Once | INTRAVENOUS | Status: AC
  Administered 2018-08-10: 20 mg via INTRAVENOUS
  Filled 2018-08-10: qty 50

## 2018-08-10 MED ORDER — SODIUM CHLORIDE 0.9 % IV SOLN: 375.0000 mg/m2 | Freq: Once | INTRAVENOUS | Status: DC

## 2018-08-10 MED ORDER — DEXAMETHASONE SODIUM PHOSPHATE 10 MG/ML IJ SOLN
10.0000 mg | Freq: Once | INTRAMUSCULAR | Status: AC
  Administered 2018-08-10: 10 mg via INTRAVENOUS
  Filled 2018-08-10: qty 1

## 2018-08-10 MED ORDER — SODIUM CHLORIDE 0.9 % IV SOLN: 10.0000 mg | Freq: Once | INTRAVENOUS | Status: DC

## 2018-08-10 MED ORDER — SODIUM CHLORIDE 0.9 % IV SOLN
375.0000 mg/m2 | Freq: Once | INTRAVENOUS | Status: AC
  Administered 2018-08-10: 700 mg via INTRAVENOUS
  Filled 2018-08-10: qty 50

## 2018-08-10 MED ORDER — DIPHENHYDRAMINE HCL 50 MG/ML IJ SOLN
25.0000 mg | Freq: Once | INTRAMUSCULAR | Status: AC
  Administered 2018-08-10: 25 mg via INTRAVENOUS
  Filled 2018-08-10: qty 1

## 2018-08-10 MED ORDER — ACETAMINOPHEN 325 MG PO TABS
650.0000 mg | ORAL_TABLET | Freq: Once | ORAL | Status: AC
  Administered 2018-08-10: 650 mg via ORAL
  Filled 2018-08-10: qty 2

## 2018-08-10 MED ORDER — SODIUM CHLORIDE 0.9 % IV SOLN
Freq: Once | INTRAVENOUS | Status: AC
  Administered 2018-08-10: 10:00:00 via INTRAVENOUS
  Filled 2018-08-10: qty 250

## 2018-08-10 NOTE — Progress Notes (Signed)
Patient stated that he had been with no appetite. He tries to eat but he's just not hungry.

## 2018-08-14 ENCOUNTER — Other Ambulatory Visit: Payer: Self-pay

## 2018-08-16 NOTE — Progress Notes (Signed)
Scott [name]  Telephone:(336) [phone] Fax:(336) [phone]  ID: Scott [name] OB: 10-06-1940  MR#: [id number]  WOE#:[id number]  Patient Care Team: [name], MD as PCP - General (Family Medicine) Lloyd Huger, MD as Consulting Physician (Oncology) Clent Jacks, MD as Consulting Physician (Ophthalmology) Dorothy Spark, MD as Consulting Physician (Cardiology) Madelon Lips, MD as Consulting Physician (Nephrology)  CHIEF COMPLAINT: 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  INTERVAL HISTORY: Patient returns to clinic today for further evaluation and consideration of cycle 2 of 4 of weekly Rituxan.  He continues to have chronic weakness and fatigue, which is slightly worse this week but otherwise feels well. He has easy bruising, but denies any bleeding.  He denies any recent fevers or illnesses.  He denies any dizziness or other neurologic complaints. He has a good appetite and denies weight loss. He denies any night sweats. He has noted no lymphadenopathy.  He has no chest pain, shortness of breath, cough, or hemoptysis.  He denies any nausea, vomiting, constipation, or diarrhea. He has no urinary complaints.  Patient offers no further specific complaints today.  REVIEW OF SYSTEMS:   Review of Systems  Constitutional: Positive for malaise/fatigue. Negative for fever and weight loss.  HENT: Negative.  Negative for nosebleeds.   Respiratory: Negative.  Negative for cough and shortness of breath.   Cardiovascular: Negative.  Negative for chest pain and leg swelling.  Gastrointestinal: Negative.  Negative for abdominal pain, blood in stool and melena.  Genitourinary: Negative.  Negative for dysuria and hematuria.  Musculoskeletal: Negative.  Negative for back pain and neck pain.  Skin: Negative.  Negative for itching and rash.  Neurological: Positive for weakness. Negative for dizziness, sensory change, focal weakness and headaches.    Endo/Heme/Allergies: Bruises/bleeds easily.  Psychiatric/Behavioral: Negative.  The patient is not nervous/anxious.     As per HPI. Otherwise, a complete review of systems is negative.  PAST MEDICAL HISTORY: Past Medical History:  Diagnosis Date   Acquired hand deformity 1962   hand saw accident at work   Anemia 10/11/2011   Arthritis    Bradycardia 10/10/2011   CAD (coronary artery disease) 10/10/2011   MI s/p PTCA (Dx-OM2 proximal concentric stenosis)   CKD (chronic kidney disease) stage 3, GFR 30-59 ml/min (HCC)    [name]   CLL (chronic lymphocytic leukemia) ([place])    Colon polyps    Generalized headaches    frequent   GI bleed 07/08/2017   Glaucoma    s/p laser surgery   HLD (hyperlipidemia)    Hypertension    Hypothyroidism 10/10/2011   Thrombocytopenia ([place]) 10/11/2011   Type 2 diabetes with nephropathy ([place])    DM refresher course [facility] (04/2013)    PAST SURGICAL HISTORY: Past Surgical History:  Procedure Laterality Date   BACK SURGERY     cervical neck   CATARACT EXTRACTION, BILATERAL Bilateral 2017   COLONOSCOPY  11/2008   1 polyp, diverticulosis, rec rpt 5 yrs (Dr. Oletta Lamas, [name])   COLONOSCOPY  06/2014   hyperplastic polyp, rpt 5 yrs ([place])   COLONOSCOPY WITH PROPOFOL N/A 11/17/2017   TA, HP, (Vanga, Tally Due, MD)   ESOPHAGOGASTRODUODENOSCOPY (EGD) WITH PROPOFOL N/A 11/17/2017   healing erosive gastritis, intestinal metaplasia, neg H pylori (Vanga, Tally Due, MD)   EYE SURGERY  2012   laser surgery for glaucoma   PTCA  1994, 1995   US ECHOCARDIOGRAPHY  10/2013   inferior wall hypokinesis, mild LVH, EF 50-55%, mild MR and LA  dilation    FAMILY HISTORY: Family History  Problem Relation Age of Onset   Diabetes Sister    Stomach cancer Sister 22   Pneumonia Father        caused death   Other Brother        no communication with brother so unsure of any health conditions   Coronary artery disease Son 2       5v CABG and  stents   Hyperlipidemia Sister    Stroke Neg Hx    Heart attack Neg Hx     ADVANCED DIRECTIVES (Y/N):  N  HEALTH MAINTENANCE: Social History   Tobacco Use   Smoking status: Former Smoker    Quit date: 03/04/1978    Years since quitting: 40.4   Smokeless tobacco: Never Used  Substance Use Topics   Alcohol use: No   Drug use: No     Colonoscopy:  PAP:  Bone density:  Lipid panel:  No Known Allergies  Current Outpatient Medications  Medication Sig Dispense Refill   acetaminophen (TYLENOL) 650 MG CR tablet Take 650 mg by mouth every 8 (eight) hours as needed for pain.     atorvastatin (LIPITOR) 80 MG tablet Take 80 mg by mouth daily.     carvedilol (COREG) 6.25 MG tablet Take 1 tablet (6.25 mg total) by mouth 2 (two) times daily. Please make annual appt for future refills. Thank you 180 tablet 0   glucose blood (ONE TOUCH ULTRA TEST) test strip 1 each by Other route 4 (four) times daily. and as needed to check sugars Dx code:  E11.21 400 each 3   insulin aspart (NOVOLOG FLEXPEN) 100 UNIT/ML FlexPen Take 5 units with meals, take 10 units if mealtime sugar >250 15 mL 11   Insulin Detemir (LEVEMIR FLEXTOUCH) 100 UNIT/ML Pen INJECT 25 UNITS SUBCUTANEOUSLY ONCE DAILY AT  10PM 15 pen 3   levothyroxine (SYNTHROID) 88 MCG tablet Take 1 tablet (88 mcg total) by mouth daily. 90 tablet 1   nitroGLYCERIN (NITROSTAT) 0.4 MG SL tablet Place 1 tablet (0.4 mg total) under the tongue every 5 (five) minutes as needed for chest pain ((MAX of 3 doses)). 25 tablet 0   traZODone (DESYREL) 100 MG tablet Take 1 tablet (100 mg total) by mouth at bedtime as needed for sleep. 90 tablet 1   No current facility-administered medications for this visit.     OBJECTIVE: Vitals:   08/17/18 0855  BP: 120/62  Pulse: 76  Temp: (!) 94.8 F (34.9 C)     Body mass index is 20.92 kg/m.    ECOG FS:0 - Asymptomatic  General: Well-developed, well-nourished, no acute distress. Eyes: Pink  conjunctiva, anicteric sclera. HEENT: Normocephalic, moist mucous membranes. Lungs: Clear to auscultation bilaterally. Heart: Regular rate and rhythm. No rubs, murmurs, or gallops. Abdomen: Soft, nontender, nondistended. No organomegaly noted, normoactive bowel sounds. Musculoskeletal: No edema, cyanosis, or clubbing. Neuro: Alert, answering all questions appropriately. Cranial nerves grossly intact. Skin: No rashes or petechiae noted.  Ecchymosis noted on bilateral upper extremity. Psych: Normal affect.  LAB RESULTS:  Lab Results  Component Value Date   NA 142 06/24/2018   K 5.3 No hemolysis seen (H) 06/24/2018   CL 108 06/24/2018   CO2 28 06/24/2018   GLUCOSE 205 (H) 06/24/2018   BUN 50 (H) 06/24/2018   CREATININE 1.48 06/24/2018   CALCIUM 9.8 06/24/2018   PROT 6.3 (L) 04/29/2018   ALBUMIN 4.1 06/24/2018   AST 28 04/29/2018   ALT 16 04/29/2018  ALKPHOS 100 04/29/2018   BILITOT 1.1 04/29/2018   GFRNONAA 45 (L) 05/30/2018   GFRAA 53 (L) 05/30/2018    Lab Results  Component Value Date   WBC 4.4 08/17/2018   NEUTROABS 1.6 (L) 08/17/2018   HGB 6.6 (L) 08/17/2018   HCT 20.4 (L) 08/17/2018   MCV 95.3 08/17/2018   PLT 46 (L) 08/17/2018     STUDIES: Ct Bone Marrow Biopsy & Aspiration  Result Date: 07/28/2018 INDICATION: 78 year old male with CLL EXAM: CT BONE MARROW BIOPSY AND ASPIRATION MEDICATIONS: None. ANESTHESIA/SEDATION: Moderate (conscious) sedation was employed during this procedure. A total of Versed 2.0 mg and Fentanyl 100 mcg was administered intravenously. Moderate Sedation Time: 12 minutes. The patient's level of consciousness and vital signs were monitored continuously by radiology nursing throughout the procedure under my direct supervision. FLUOROSCOPY TIME:  CT COMPLICATIONS: None PROCEDURE: The procedure risks, benefits, and alternatives were explained to the patient. Questions regarding the procedure were encouraged and answered. The patient understands and  consents to the procedure. Scout CT of the pelvis was performed for surgical planning purposes. The posterior pelvis was prepped with Chlorhexidine in a sterile fashion, and a sterile drape was applied covering the operative field. A sterile gown and sterile gloves were used for the procedure. Local anesthesia was provided with 1% Lidocaine. Left posterior iliac bone was targeted for biopsy. The skin and subcutaneous tissues were infiltrated with 1% lidocaine without epinephrine. A small stab incision was made with an 11 blade scalpel, and an 11 gauge Murphy needle was advanced with CT guidance to the posterior cortex. Manual forced was used to advance the needle through the posterior cortex and the stylet was removed. A bone marrow aspirate was retrieved and passed to a cytotechnologist in the room. The Murphy needle was then advanced without the stylet for a core biopsy. The core biopsy was retrieved and also passed to a cytotechnologist. Manual pressure was used for hemostasis and a sterile dressing was placed. No complications were encountered no significant blood loss was encountered. Patient tolerated the procedure well and remained hemodynamically stable throughout. IMPRESSION: Status post CT-guided bone marrow biopsy, with tissue specimen sent to pathology for complete histopathologic analysis Signed, Dulcy Fanny. Earleen Newport, DO [facility] Radiology Electronically Signed   By: Corrie Mckusick D.O.   On: 07/28/2018 10:02    ASSESSMENT: CLL with ATM (11q-) mutation and 13q-, anemia, ITP.  PLAN:    1. CLL: Repeat bone marrow biopsy on Jul 28, 2018 reviewed independently with 73% involvement of CLL.  Previous bone marrow biopsy on October 23, 2017 revealed only 31% involvement.  Previously, peripheral blood FISH testing 50% incidence of mutation in the ATM gene which is commonly associated with deletion 11q. 11q- is associated with an unfavorable prognosis and high  risk of not responding to initial treatment. 13q- is a more common mutation and is actually associated with a more favorable prognosis. Patient's white blood cell count continues to be within normal limits.  Patient has clear progression of disease in his bone marrow along with a worsening transfusion requirement, will plan to retreat with single agent Rituxan x4 weeks.  At which point can consider adding and Treanda for more durable response.  Proceed with cycle 2 of 4 of weekly Rituxan.  Return to clinic in 1 week for further evaluation and consideration of cycle 3.   2.  Thrombocytopenia: Platelet count has improved to 46 today.  Patient has received high-dose prednisone, IVIG, and Rituxan with  no appreciable durability to improve his platelet count.  Rituxan as above.   3.  Anemia: Patient's hemoglobin continues to trend down is now 6.6.  Patient will require 1 unit of packed red blood cells later this week.  All blood products should be irradiated.  He previously reported black tarry stools, but none recently.  Patient had colonoscopy on November 17, 2017 that removed 2 nonmalignant polyps.  EGD on the same day revealed nonbleeding erosive gastropathy with multiple nonbleeding duodenal ulcers.  Consider referral back to GI after COVID-19.  Rituxan as above.   4.  Reaction to Rituxan: Rate-based.  Patient will require premedications for any further infusions with Rituxan.  These have been entered into his treatment plan. 5.  Easy bruising: Secondary to thrombocytopenia.  Monitor.  Patient expressed understanding and was in agreement with this plan. He also understands that He can call clinic at any time with any questions, concerns, or complaints.    Lloyd Huger, MD   08/17/2018 2:23 PM

## 2018-08-17 ENCOUNTER — Inpatient Hospital Stay: Payer: Medicare Other

## 2018-08-17 ENCOUNTER — Other Ambulatory Visit: Payer: Self-pay

## 2018-08-17 ENCOUNTER — Encounter: Payer: Self-pay | Admitting: Oncology

## 2018-08-17 ENCOUNTER — Inpatient Hospital Stay (HOSPITAL_BASED_OUTPATIENT_CLINIC_OR_DEPARTMENT_OTHER): Payer: Medicare Other | Admitting: Oncology

## 2018-08-17 VITALS — BP 120/62 | HR 76 | Temp 94.8°F | Ht 71.0 in | Wt 150.0 lb

## 2018-08-17 VITALS — BP 156/72 | HR 72 | Temp 95.0°F | Resp 18

## 2018-08-17 DIAGNOSIS — R195 Other fecal abnormalities: Secondary | ICD-10-CM

## 2018-08-17 DIAGNOSIS — Z79899 Other long term (current) drug therapy: Secondary | ICD-10-CM

## 2018-08-17 DIAGNOSIS — I2510 Atherosclerotic heart disease of native coronary artery without angina pectoris: Secondary | ICD-10-CM | POA: Diagnosis not present

## 2018-08-17 DIAGNOSIS — R5383 Other fatigue: Secondary | ICD-10-CM | POA: Diagnosis not present

## 2018-08-17 DIAGNOSIS — N183 Chronic kidney disease, stage 3 (moderate): Secondary | ICD-10-CM | POA: Diagnosis not present

## 2018-08-17 DIAGNOSIS — D649 Anemia, unspecified: Secondary | ICD-10-CM

## 2018-08-17 DIAGNOSIS — D693 Immune thrombocytopenic purpura: Secondary | ICD-10-CM | POA: Diagnosis not present

## 2018-08-17 DIAGNOSIS — K319 Disease of stomach and duodenum, unspecified: Secondary | ICD-10-CM | POA: Diagnosis not present

## 2018-08-17 DIAGNOSIS — Z794 Long term (current) use of insulin: Secondary | ICD-10-CM

## 2018-08-17 DIAGNOSIS — K629 Disease of anus and rectum, unspecified: Secondary | ICD-10-CM

## 2018-08-17 DIAGNOSIS — Z800 Family history of malignant neoplasm of digestive organs: Secondary | ICD-10-CM

## 2018-08-17 DIAGNOSIS — M1990 Unspecified osteoarthritis, unspecified site: Secondary | ICD-10-CM | POA: Diagnosis not present

## 2018-08-17 DIAGNOSIS — R531 Weakness: Secondary | ICD-10-CM | POA: Diagnosis not present

## 2018-08-17 DIAGNOSIS — Z833 Family history of diabetes mellitus: Secondary | ICD-10-CM

## 2018-08-17 DIAGNOSIS — E1122 Type 2 diabetes mellitus with diabetic chronic kidney disease: Secondary | ICD-10-CM

## 2018-08-17 DIAGNOSIS — C9110 Chronic lymphocytic leukemia of B-cell type not having achieved remission: Secondary | ICD-10-CM | POA: Diagnosis not present

## 2018-08-17 DIAGNOSIS — E785 Hyperlipidemia, unspecified: Secondary | ICD-10-CM

## 2018-08-17 DIAGNOSIS — I129 Hypertensive chronic kidney disease with stage 1 through stage 4 chronic kidney disease, or unspecified chronic kidney disease: Secondary | ICD-10-CM | POA: Diagnosis not present

## 2018-08-17 DIAGNOSIS — D500 Iron deficiency anemia secondary to blood loss (chronic): Secondary | ICD-10-CM | POA: Diagnosis not present

## 2018-08-17 DIAGNOSIS — D630 Anemia in neoplastic disease: Secondary | ICD-10-CM | POA: Diagnosis not present

## 2018-08-17 DIAGNOSIS — Z87891 Personal history of nicotine dependence: Secondary | ICD-10-CM

## 2018-08-17 DIAGNOSIS — Z8489 Family history of other specified conditions: Secondary | ICD-10-CM

## 2018-08-17 DIAGNOSIS — Z5112 Encounter for antineoplastic immunotherapy: Secondary | ICD-10-CM | POA: Diagnosis not present

## 2018-08-17 LAB — CBC WITH DIFFERENTIAL/PLATELET
Abs Immature Granulocytes: 0.08 10*3/uL — ABNORMAL HIGH (ref 0.00–0.07)
Basophils Absolute: 0 10*3/uL (ref 0.0–0.1)
Basophils Relative: 1 %
Eosinophils Absolute: 0.1 10*3/uL (ref 0.0–0.5)
Eosinophils Relative: 3 %
HCT: 20.4 % — ABNORMAL LOW (ref 39.0–52.0)
Hemoglobin: 6.6 g/dL — ABNORMAL LOW (ref 13.0–17.0)
Immature Granulocytes: 2 %
Lymphocytes Relative: 51 %
Lymphs Abs: 2.2 10*3/uL (ref 0.7–4.0)
MCH: 30.8 pg (ref 26.0–34.0)
MCHC: 32.4 g/dL (ref 30.0–36.0)
MCV: 95.3 fL (ref 80.0–100.0)
Monocytes Absolute: 0.3 10*3/uL (ref 0.1–1.0)
Monocytes Relative: 6 %
Neutro Abs: 1.6 10*3/uL — ABNORMAL LOW (ref 1.7–7.7)
Neutrophils Relative %: 37 %
Platelets: 46 10*3/uL — ABNORMAL LOW (ref 150–400)
RBC: 2.14 MIL/uL — ABNORMAL LOW (ref 4.22–5.81)
RDW: 21.2 % — ABNORMAL HIGH (ref 11.5–15.5)
Smear Review: NORMAL
WBC: 4.4 10*3/uL (ref 4.0–10.5)
nRBC: 0 % (ref 0.0–0.2)

## 2018-08-17 LAB — SAMPLE TO BLOOD BANK

## 2018-08-17 LAB — PREPARE RBC (CROSSMATCH)

## 2018-08-17 MED ORDER — ACETAMINOPHEN 325 MG PO TABS
650.0000 mg | ORAL_TABLET | Freq: Once | ORAL | Status: AC
  Administered 2018-08-17: 650 mg via ORAL
  Filled 2018-08-17: qty 2

## 2018-08-17 MED ORDER — FAMOTIDINE IN NACL 20-0.9 MG/50ML-% IV SOLN
20.0000 mg | Freq: Once | INTRAVENOUS | Status: AC
  Administered 2018-08-17: 20 mg via INTRAVENOUS
  Filled 2018-08-17: qty 50

## 2018-08-17 MED ORDER — SODIUM CHLORIDE 0.9 % IV SOLN
Freq: Once | INTRAVENOUS | Status: AC
  Administered 2018-08-17: 10:00:00 via INTRAVENOUS
  Filled 2018-08-17: qty 250

## 2018-08-17 MED ORDER — SODIUM CHLORIDE 0.9 % IV SOLN
375.0000 mg/m2 | Freq: Once | INTRAVENOUS | Status: AC
  Administered 2018-08-17: 700 mg via INTRAVENOUS
  Filled 2018-08-17: qty 50

## 2018-08-17 MED ORDER — DEXAMETHASONE SODIUM PHOSPHATE 10 MG/ML IJ SOLN
10.0000 mg | Freq: Once | INTRAMUSCULAR | Status: AC
  Administered 2018-08-17: 10 mg via INTRAVENOUS
  Filled 2018-08-17: qty 1

## 2018-08-17 MED ORDER — DIPHENHYDRAMINE HCL 50 MG/ML IJ SOLN
25.0000 mg | Freq: Once | INTRAMUSCULAR | Status: AC
  Administered 2018-08-17: 25 mg via INTRAVENOUS
  Filled 2018-08-17: qty 1

## 2018-08-17 NOTE — Progress Notes (Signed)
Patient stated that he had been doing well. Patient feels tired today because he was doing a lot at home. Patient denied fever, chills, nausea, vomiting, constipation or diarrhea. Patient's appetite is good.

## 2018-08-17 NOTE — Progress Notes (Signed)
Per Tillie Rung RN per Dr. [name] okay to proceed with 08/17/2018 CBC results. Pt will be scheduled for blood transfusion.   At Approximately 1100 am pt reports shaking after receiving Decadron push. Pt states he did take Insulin this morning, Pt has had snacks and drinks while in infusion chair. Pt denies any other symptoms. VS stable. Dr. [name] aware, per Dr. [name] monitor closely and proceed with treatment.  1130: pt reports "shaking is better". Pt denies any other complaints.  1210: "shaking" is completely resolved, pt denies any other complaints.  1325. PT and VS stable at discharge.

## 2018-08-18 ENCOUNTER — Other Ambulatory Visit: Payer: Self-pay

## 2018-08-19 ENCOUNTER — Inpatient Hospital Stay: Payer: Medicare Other

## 2018-08-19 ENCOUNTER — Other Ambulatory Visit: Payer: Self-pay

## 2018-08-19 DIAGNOSIS — C9110 Chronic lymphocytic leukemia of B-cell type not having achieved remission: Secondary | ICD-10-CM

## 2018-08-19 DIAGNOSIS — I2510 Atherosclerotic heart disease of native coronary artery without angina pectoris: Secondary | ICD-10-CM | POA: Diagnosis not present

## 2018-08-19 DIAGNOSIS — D693 Immune thrombocytopenic purpura: Secondary | ICD-10-CM | POA: Diagnosis not present

## 2018-08-19 DIAGNOSIS — N183 Chronic kidney disease, stage 3 (moderate): Secondary | ICD-10-CM | POA: Diagnosis not present

## 2018-08-19 DIAGNOSIS — Z5112 Encounter for antineoplastic immunotherapy: Secondary | ICD-10-CM | POA: Diagnosis not present

## 2018-08-19 DIAGNOSIS — D630 Anemia in neoplastic disease: Secondary | ICD-10-CM | POA: Diagnosis not present

## 2018-08-19 MED ORDER — SODIUM CHLORIDE 0.9% IV SOLUTION
250.0000 mL | Freq: Once | INTRAVENOUS | Status: AC
  Administered 2018-08-19: 10:00:00 250 mL via INTRAVENOUS
  Filled 2018-08-19: qty 250

## 2018-08-19 MED ORDER — ACETAMINOPHEN 325 MG PO TABS
650.0000 mg | ORAL_TABLET | Freq: Once | ORAL | Status: AC
  Administered 2018-08-19: 650 mg via ORAL
  Filled 2018-08-19: qty 2

## 2018-08-19 MED ORDER — DIPHENHYDRAMINE HCL 50 MG/ML IJ SOLN
25.0000 mg | Freq: Once | INTRAMUSCULAR | Status: AC
  Administered 2018-08-19: 25 mg via INTRAVENOUS
  Filled 2018-08-19: qty 1

## 2018-08-20 LAB — BPAM RBC
Blood Product Expiration Date: 202006262359
ISSUE DATE / TIME: 202006170942
Unit Type and Rh: 6200

## 2018-08-20 LAB — TYPE AND SCREEN
ABO/RH(D): A POS
Antibody Screen: NEGATIVE
Donor AG Type: NEGATIVE
Unit division: 0

## 2018-08-24 ENCOUNTER — Other Ambulatory Visit: Payer: Self-pay | Admitting: Oncology

## 2018-08-24 ENCOUNTER — Other Ambulatory Visit: Payer: Self-pay

## 2018-08-24 ENCOUNTER — Inpatient Hospital Stay: Payer: Medicare Other

## 2018-08-24 ENCOUNTER — Inpatient Hospital Stay (HOSPITAL_BASED_OUTPATIENT_CLINIC_OR_DEPARTMENT_OTHER): Payer: Medicare Other | Admitting: Oncology

## 2018-08-24 ENCOUNTER — Encounter: Payer: Self-pay | Admitting: Oncology

## 2018-08-24 VITALS — BP 147/71 | HR 58 | Temp 95.7°F | Resp 16 | Wt 146.5 lb

## 2018-08-24 DIAGNOSIS — C9110 Chronic lymphocytic leukemia of B-cell type not having achieved remission: Secondary | ICD-10-CM

## 2018-08-24 DIAGNOSIS — I129 Hypertensive chronic kidney disease with stage 1 through stage 4 chronic kidney disease, or unspecified chronic kidney disease: Secondary | ICD-10-CM | POA: Diagnosis not present

## 2018-08-24 DIAGNOSIS — M1990 Unspecified osteoarthritis, unspecified site: Secondary | ICD-10-CM

## 2018-08-24 DIAGNOSIS — Z8489 Family history of other specified conditions: Secondary | ICD-10-CM

## 2018-08-24 DIAGNOSIS — R531 Weakness: Secondary | ICD-10-CM

## 2018-08-24 DIAGNOSIS — I2510 Atherosclerotic heart disease of native coronary artery without angina pectoris: Secondary | ICD-10-CM | POA: Diagnosis not present

## 2018-08-24 DIAGNOSIS — R5383 Other fatigue: Secondary | ICD-10-CM

## 2018-08-24 DIAGNOSIS — N183 Chronic kidney disease, stage 3 (moderate): Secondary | ICD-10-CM | POA: Diagnosis not present

## 2018-08-24 DIAGNOSIS — Z5112 Encounter for antineoplastic immunotherapy: Secondary | ICD-10-CM | POA: Diagnosis not present

## 2018-08-24 DIAGNOSIS — K269 Duodenal ulcer, unspecified as acute or chronic, without hemorrhage or perforation: Secondary | ICD-10-CM

## 2018-08-24 DIAGNOSIS — Z794 Long term (current) use of insulin: Secondary | ICD-10-CM

## 2018-08-24 DIAGNOSIS — E1122 Type 2 diabetes mellitus with diabetic chronic kidney disease: Secondary | ICD-10-CM

## 2018-08-24 DIAGNOSIS — D500 Iron deficiency anemia secondary to blood loss (chronic): Secondary | ICD-10-CM | POA: Diagnosis not present

## 2018-08-24 DIAGNOSIS — D693 Immune thrombocytopenic purpura: Secondary | ICD-10-CM

## 2018-08-24 DIAGNOSIS — D630 Anemia in neoplastic disease: Secondary | ICD-10-CM | POA: Diagnosis not present

## 2018-08-24 DIAGNOSIS — Z833 Family history of diabetes mellitus: Secondary | ICD-10-CM

## 2018-08-24 DIAGNOSIS — R195 Other fecal abnormalities: Secondary | ICD-10-CM

## 2018-08-24 DIAGNOSIS — K319 Disease of stomach and duodenum, unspecified: Secondary | ICD-10-CM | POA: Diagnosis not present

## 2018-08-24 DIAGNOSIS — D649 Anemia, unspecified: Secondary | ICD-10-CM

## 2018-08-24 DIAGNOSIS — Z79899 Other long term (current) drug therapy: Secondary | ICD-10-CM

## 2018-08-24 DIAGNOSIS — Z87891 Personal history of nicotine dependence: Secondary | ICD-10-CM

## 2018-08-24 DIAGNOSIS — Z800 Family history of malignant neoplasm of digestive organs: Secondary | ICD-10-CM

## 2018-08-24 DIAGNOSIS — E785 Hyperlipidemia, unspecified: Secondary | ICD-10-CM

## 2018-08-24 LAB — COMPREHENSIVE METABOLIC PANEL
ALT: 14 U/L (ref 0–44)
AST: 19 U/L (ref 15–41)
Albumin: 3.4 g/dL — ABNORMAL LOW (ref 3.5–5.0)
Alkaline Phosphatase: 87 U/L (ref 38–126)
Anion gap: 9 (ref 5–15)
BUN: 49 mg/dL — ABNORMAL HIGH (ref 8–23)
CO2: 25 mmol/L (ref 22–32)
Calcium: 9 mg/dL (ref 8.9–10.3)
Chloride: 107 mmol/L (ref 98–111)
Creatinine, Ser: 1.5 mg/dL — ABNORMAL HIGH (ref 0.61–1.24)
GFR calc Af Amer: 51 mL/min — ABNORMAL LOW (ref >60)
GFR calc non Af Amer: 44 mL/min — ABNORMAL LOW (ref >60)
Glucose, Bld: 175 mg/dL — ABNORMAL HIGH (ref 70–99)
Potassium: 4.4 mmol/L (ref 3.5–5.1)
Sodium: 141 mmol/L (ref 135–145)
Total Bilirubin: 1 mg/dL (ref 0.3–1.2)
Total Protein: 5.8 g/dL — ABNORMAL LOW (ref 6.5–8.1)

## 2018-08-24 LAB — CBC WITH DIFFERENTIAL/PLATELET
Abs Immature Granulocytes: 0.07 10*3/uL (ref 0.00–0.07)
Basophils Absolute: 0 10*3/uL (ref 0.0–0.1)
Basophils Relative: 0 %
Eosinophils Absolute: 0.1 10*3/uL (ref 0.0–0.5)
Eosinophils Relative: 2 %
HCT: 22.9 % — ABNORMAL LOW (ref 39.0–52.0)
Hemoglobin: 7.2 g/dL — ABNORMAL LOW (ref 13.0–17.0)
Immature Granulocytes: 1 %
Lymphocytes Relative: 57 %
Lymphs Abs: 3.9 10*3/uL (ref 0.7–4.0)
MCH: 30.5 pg (ref 26.0–34.0)
MCHC: 31.4 g/dL (ref 30.0–36.0)
MCV: 97 fL (ref 80.0–100.0)
Monocytes Absolute: 0.3 10*3/uL (ref 0.1–1.0)
Monocytes Relative: 4 %
Neutro Abs: 2.4 10*3/uL (ref 1.7–7.7)
Neutrophils Relative %: 36 %
Platelets: 51 10*3/uL — ABNORMAL LOW (ref 150–400)
RBC: 2.36 MIL/uL — ABNORMAL LOW (ref 4.22–5.81)
RDW: 21.2 % — ABNORMAL HIGH (ref 11.5–15.5)
Smear Review: NORMAL
WBC Morphology: ABNORMAL
WBC: 6.8 10*3/uL (ref 4.0–10.5)
nRBC: 0 % (ref 0.0–0.2)

## 2018-08-24 MED ORDER — ACETAMINOPHEN 325 MG PO TABS
650.0000 mg | ORAL_TABLET | Freq: Once | ORAL | Status: AC
  Administered 2018-08-24: 650 mg via ORAL
  Filled 2018-08-24: qty 2

## 2018-08-24 MED ORDER — SODIUM CHLORIDE 0.9 % IV SOLN
Freq: Once | INTRAVENOUS | Status: AC
  Administered 2018-08-24: 10:00:00 via INTRAVENOUS
  Filled 2018-08-24: qty 250

## 2018-08-24 MED ORDER — DEXAMETHASONE SODIUM PHOSPHATE 10 MG/ML IJ SOLN
10.0000 mg | Freq: Once | INTRAMUSCULAR | Status: AC
  Administered 2018-08-24: 10 mg via INTRAVENOUS
  Filled 2018-08-24: qty 1

## 2018-08-24 MED ORDER — DIPHENHYDRAMINE HCL 50 MG/ML IJ SOLN
25.0000 mg | Freq: Once | INTRAMUSCULAR | Status: AC
  Administered 2018-08-24: 25 mg via INTRAVENOUS
  Filled 2018-08-24: qty 1

## 2018-08-24 MED ORDER — SODIUM CHLORIDE 0.9 % IV SOLN
375.0000 mg/m2 | Freq: Once | INTRAVENOUS | Status: AC
  Administered 2018-08-24: 12:00:00 700 mg via INTRAVENOUS
  Filled 2018-08-24: qty 50

## 2018-08-24 MED ORDER — FAMOTIDINE IN NACL 20-0.9 MG/50ML-% IV SOLN
20.0000 mg | Freq: Once | INTRAVENOUS | Status: AC
  Administered 2018-08-24: 20 mg via INTRAVENOUS
  Filled 2018-08-24: qty 50

## 2018-08-24 NOTE — Progress Notes (Signed)
Labs reviewed by Dr. Norberta Keens, NP, proceed with treatment today.

## 2018-08-24 NOTE — Progress Notes (Signed)
[place]  [phone] [phone]  ID: Scott [name] OB: [birth year]  MR#: [id number]  PRF#:[id number]  Patient Care Team: [name], MD as PCP - General (Family Medicine) Lloyd Huger, MD as Consulting Physician (Oncology) Clent Jacks, MD as Consulting Physician (Ophthalmology) Dorothy Spark, MD as Consulting Physician (Cardiology) Madelon Lips, MD as Consulting Physician (Nephrology)  CHIEF COMPLAINT: 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  INTERVAL HISTORY: Patient returns to clinic today for further evaluation and consideration of cycle 3 of 4 of weekly Rituxan.  He continues to have chronic weakness and fatigue, which has improved since receiving blood last week.  He has easy bruising, but denies any bleeding.  He denies any recent fevers or illnesses.  He denies any dizziness or other neurologic complaints. He has no appetite. He denies weight loss. He denies any night sweats. He has noted no lymphadenopathy.  He has no chest pain, shortness of breath, cough, or hemoptysis.  He denies any nausea, vomiting, constipation, or diarrhea. He has no urinary complaints.  Patient offers no further specific complaints today.  REVIEW OF SYSTEMS:   Review of Systems  Constitutional: Positive for malaise/fatigue. Negative for chills, fever and weight loss.       Energy has improved since receiving blood  HENT: Negative for congestion, ear pain and tinnitus.   Eyes: Negative.  Negative for blurred vision and double vision.  Respiratory: Negative.  Negative for cough, sputum production and shortness of breath.   Cardiovascular: Negative.  Negative for chest pain, palpitations and leg swelling.  Gastrointestinal: Negative.  Negative for abdominal pain, constipation, diarrhea, nausea and vomiting.  Genitourinary: Negative for dysuria, frequency and urgency.  Musculoskeletal: Negative for back pain and falls.  Skin: Negative.   Negative for rash.  Neurological: Positive for weakness. Negative for headaches.  Endo/Heme/Allergies: Negative.  Does not bruise/bleed easily.  Psychiatric/Behavioral: Negative.  Negative for depression. The patient is not nervous/anxious and does not have insomnia.     As per HPI. Otherwise, a complete review of systems is negative.  PAST MEDICAL HISTORY: Past Medical History:  Diagnosis Date   Acquired hand deformity 1962   hand saw accident at work   Anemia 10/11/2011   Arthritis    Bradycardia 10/10/2011   CAD (coronary artery disease) 10/10/2011   MI s/p PTCA (Dx-OM2 proximal concentric stenosis)   CKD (chronic kidney disease) stage 3, GFR 30-59 ml/min (HCC)    [name]   CLL (chronic lymphocytic leukemia) ([place])    Colon polyps    Generalized headaches    frequent   GI bleed 07/08/2017   Glaucoma    s/p laser surgery   HLD (hyperlipidemia)    Hypertension    Hypothyroidism 10/10/2011   Thrombocytopenia ([place]) 10/11/2011   Type 2 diabetes with nephropathy ([place])    DM refresher course [facility] (04/2013)    PAST SURGICAL HISTORY: Past Surgical History:  Procedure Laterality Date   BACK SURGERY     cervical neck   CATARACT EXTRACTION, BILATERAL Bilateral 2017   COLONOSCOPY  11/2008   1 polyp, diverticulosis, rec rpt 5 yrs (Dr. Oletta Lamas, [name])   COLONOSCOPY  06/2014   hyperplastic polyp, rpt 5 yrs Oletta Lamas)   COLONOSCOPY WITH PROPOFOL N/A 11/17/2017   TA, HP, (Vanga, Tally Due, MD)   ESOPHAGOGASTRODUODENOSCOPY (EGD) WITH PROPOFOL N/A 11/17/2017   healing erosive gastritis, intestinal metaplasia, neg H pylori (Vanga, Tally Due, MD)   EYE SURGERY  2012   laser surgery for  glaucoma   PTCA  1994, 1995   US ECHOCARDIOGRAPHY  10/2013   inferior wall hypokinesis, mild LVH, EF 50-55%, mild MR and LA dilation    FAMILY HISTORY: Family History  Problem Relation Age of Onset   Diabetes Sister    Stomach cancer Sister 52   Pneumonia Father        caused  death   Other Brother        no communication with brother so unsure of any health conditions   Coronary artery disease Son 47       5v CABG and stents   Hyperlipidemia Sister    Stroke Neg Hx    Heart attack Neg Hx     ADVANCED DIRECTIVES (Y/N):  N  HEALTH MAINTENANCE: Social History   Tobacco Use   Smoking status: Former Smoker    Quit date: 03/04/1978    Years since quitting: 40.5   Smokeless tobacco: Never Used  Substance Use Topics   Alcohol use: No   Drug use: No     Colonoscopy:  PAP:  Bone density:  Lipid panel:  No Known Allergies  Current Outpatient Medications  Medication Sig Dispense Refill   acetaminophen (TYLENOL) 650 MG CR tablet Take 650 mg by mouth every 8 (eight) hours as needed for pain.     atorvastatin (LIPITOR) 80 MG tablet Take 80 mg by mouth daily.     carvedilol (COREG) 6.25 MG tablet Take 1 tablet (6.25 mg total) by mouth 2 (two) times daily. Please make annual appt for future refills. Thank you 180 tablet 0   glucose blood (ONE TOUCH ULTRA TEST) test strip 1 each by Other route 4 (four) times daily. and as needed to check sugars Dx code:  E11.21 400 each 3   insulin aspart (NOVOLOG FLEXPEN) 100 UNIT/ML FlexPen Take 5 units with meals, take 10 units if mealtime sugar >250 15 mL 11   Insulin Detemir (LEVEMIR FLEXTOUCH) 100 UNIT/ML Pen INJECT 25 UNITS SUBCUTANEOUSLY ONCE DAILY AT  10PM 15 pen 3   levothyroxine (SYNTHROID) 88 MCG tablet Take 1 tablet (88 mcg total) by mouth daily. 90 tablet 1   nitroGLYCERIN (NITROSTAT) 0.4 MG SL tablet Place 1 tablet (0.4 mg total) under the tongue every 5 (five) minutes as needed for chest pain ((MAX of 3 doses)). 25 tablet 0   traZODone (DESYREL) 100 MG tablet Take 1 tablet (100 mg total) by mouth at bedtime as needed for sleep. 90 tablet 1   No current facility-administered medications for this visit.     OBJECTIVE: Vitals:   08/24/18 0841  BP: (!) 147/71  Pulse: (!) 58  Resp: 16  Temp:  (!) 95.7 F (35.4 C)     Body mass index is 20.43 kg/m.    ECOG FS:0 - Asymptomatic  Physical Exam  Constitutional: He is oriented to person, place, and time and well-developed, well-nourished, and in no distress. Vital signs are normal.  Frail appearing  HENT:  Head: Normocephalic and atraumatic.  Eyes: Pupils are equal, round, and reactive to light.  Neck: Normal range of motion.  Cardiovascular: Normal rate, regular rhythm and normal heart sounds.  No murmur heard. Pulmonary/Chest: Effort normal and breath sounds normal. He has no wheezes.  Abdominal: Soft. Normal appearance and bowel sounds are normal. He exhibits no distension. There is no abdominal tenderness.  Musculoskeletal: Normal range of motion.        General: No edema.  Neurological: He is alert and oriented to person, place,  and time. Gait normal.  Skin: Skin is warm and dry. No rash noted.  Psychiatric: Mood, memory, affect and judgment normal.    LAB RESULTS:  Lab Results  Component Value Date   NA 141 08/24/2018   K 4.4 08/24/2018   CL 107 08/24/2018   CO2 25 08/24/2018   GLUCOSE 175 (H) 08/24/2018   BUN 49 (H) 08/24/2018   CREATININE 1.50 (H) 08/24/2018   CALCIUM 9.0 08/24/2018   PROT 5.8 (L) 08/24/2018   ALBUMIN 3.4 (L) 08/24/2018   AST 19 08/24/2018   ALT 14 08/24/2018   ALKPHOS 87 08/24/2018   BILITOT 1.0 08/24/2018   GFRNONAA 44 (L) 08/24/2018   GFRAA 51 (L) 08/24/2018    Lab Results  Component Value Date   WBC 6.8 08/24/2018   NEUTROABS 2.4 08/24/2018   HGB 7.2 (L) 08/24/2018   HCT 22.9 (L) 08/24/2018   MCV 97.0 08/24/2018   PLT 51 (L) 08/24/2018     STUDIES: Ct Bone Marrow Biopsy & Aspiration  Result Date: 07/28/2018 INDICATION: 78 year old male with CLL EXAM: CT BONE MARROW BIOPSY AND ASPIRATION MEDICATIONS: None. ANESTHESIA/SEDATION: Moderate (conscious) sedation was employed during this procedure. A total of Versed 2.0 mg and Fentanyl 100 mcg was administered intravenously.  Moderate Sedation Time: 12 minutes. The patient's level of consciousness and vital signs were monitored continuously by radiology nursing throughout the procedure under my direct supervision. FLUOROSCOPY TIME:  CT COMPLICATIONS: None PROCEDURE: The procedure risks, benefits, and alternatives were explained to the patient. Questions regarding the procedure were encouraged and answered. The patient understands and consents to the procedure. Scout CT of the pelvis was performed for surgical planning purposes. The posterior pelvis was prepped with Chlorhexidine in a sterile fashion, and a sterile drape was applied covering the operative field. A sterile gown and sterile gloves were used for the procedure. Local anesthesia was provided with 1% Lidocaine. Left posterior iliac bone was targeted for biopsy. The skin and subcutaneous tissues were infiltrated with 1% lidocaine without epinephrine. A small stab incision was made with an 11 blade scalpel, and an 11 gauge Murphy needle was advanced with CT guidance to the posterior cortex. Manual forced was used to advance the needle through the posterior cortex and the stylet was removed. A bone marrow aspirate was retrieved and passed to a cytotechnologist in the room. The Murphy needle was then advanced without the stylet for a core biopsy. The core biopsy was retrieved and also passed to a cytotechnologist. Manual pressure was used for hemostasis and a sterile dressing was placed. No complications were encountered no significant blood loss was encountered. Patient tolerated the procedure well and remained hemodynamically stable throughout. IMPRESSION: Status post CT-guided bone marrow biopsy, with tissue specimen sent to pathology for complete histopathologic analysis Signed, Dulcy Fanny. Earleen Newport, DO [facility] Electronically Signed   By: Corrie Mckusick D.O.   On: 07/28/2018 10:02    ASSESSMENT: CLL with ATM (11q-)  mutation and 13q-, anemia, ITP.  PLAN:    1. CLL: Repeat bone marrow biopsy on Jul 28, 2018 reviewed independently with 73% involvement of CLL.  Previous bone marrow biopsy on October 23, 2017 revealed only 31% involvement.  Previously, peripheral blood FISH testing 50% incidence of mutation in the ATM gene which is commonly associated with deletion 11q. 11q- is associated with an unfavorable prognosis and high risk of not responding to initial treatment. 13q- is a more common mutation and is actually associated with a more  favorable prognosis. Patient's white blood cell count continues to be within normal limits.  Patient has clear progression of disease in his bone marrow along with a worsening transfusion requirement, will plan to retreat with single agent Rituxan x4 weeks.  At which point can consider adding and Treanda for more durable response.  Proceed with cycle 3 of 4 of weekly Rituxan.  Return to clinic in 1 week for further evaluation and consideration of cycle 4.   2.  Thrombocytopenia: Platelet count has improved to 51 today.  Patient has received high-dose prednisone, IVIG, and Rituxan with no appreciable durability to improve his platelet count.  Rituxan as above.   3.  Anemia: Today's hemoglobin 7.2.  Has improved since receiving 1 unit packed red blood cells last Wednesday.  All blood products should be irradiated. 4.  Reaction to Rituxan: Rate-based.  Patient will require premedications for any further infusions with Rituxan.  These have been entered into his treatment plan. 5.  Easy bruising: Secondary to thrombocytopenia.  Monitor. 6.  Chronic kidney disease: Creatinine 1.5.  This is baseline for him.  Continue to monitor.  Patient expressed understanding and was in agreement with this plan. He also understands that He can call clinic at any time with any questions, concerns, or complaints.    Jacquelin Hawking, NP   08/24/2018 2:53 PM

## 2018-08-25 LAB — SAMPLE TO BLOOD BANK

## 2018-08-27 ENCOUNTER — Other Ambulatory Visit: Payer: Self-pay | Admitting: Family Medicine

## 2018-08-27 ENCOUNTER — Other Ambulatory Visit: Payer: Self-pay | Admitting: Cardiology

## 2018-08-27 DIAGNOSIS — E02 Subclinical iodine-deficiency hypothyroidism: Secondary | ICD-10-CM

## 2018-08-27 DIAGNOSIS — I10 Essential (primary) hypertension: Secondary | ICD-10-CM

## 2018-08-27 DIAGNOSIS — I2510 Atherosclerotic heart disease of native coronary artery without angina pectoris: Secondary | ICD-10-CM

## 2018-08-27 DIAGNOSIS — E785 Hyperlipidemia, unspecified: Secondary | ICD-10-CM

## 2018-08-27 DIAGNOSIS — E7849 Other hyperlipidemia: Secondary | ICD-10-CM

## 2018-08-28 ENCOUNTER — Other Ambulatory Visit: Payer: Self-pay

## 2018-08-30 NOTE — Progress Notes (Signed)
[place]  Telephone:([phone]) [phone] Fax:(336) [phone]  ID: Scott [name] OB: Nov 08, 1940  MR#: [id number]  LPF#:[id number]  Patient Care Team: [name], MD as PCP - General (Family Medicine) Lloyd Huger, MD as Consulting Physician (Oncology) Clent Jacks, MD as Consulting Physician (Ophthalmology) Dorothy Spark, MD as Consulting Physician (Cardiology) Madelon Lips, MD as Consulting Physician (Nephrology)  CHIEF COMPLAINT: 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  INTERVAL HISTORY: Patient returns to clinic today for further evaluation and consideration of cycle 4 of weekly Rituxan.  He continues to have chronic weakness and fatigue, but otherwise feels well.  He continues to have occasional nosebleeds and is requesting referral to ENT.  He has easy bruising, but denies any other bleeding.  He denies any recent fevers or illnesses.  He denies any dizziness or other neurologic complaints. He has a good appetite and denies weight loss. He denies any night sweats. He has noted no lymphadenopathy.  He has no chest pain, shortness of breath, cough, or hemoptysis.  He denies any nausea, vomiting, constipation, or diarrhea. He has no urinary complaints.  Patient offers no further specific complaints today.  REVIEW OF SYSTEMS:   Review of Systems  Constitutional: Positive for malaise/fatigue. Negative for fever and weight loss.  HENT: Negative.  Negative for nosebleeds.   Respiratory: Negative.  Negative for cough and shortness of breath.   Cardiovascular: Negative.  Negative for chest pain and leg swelling.  Gastrointestinal: Negative.  Negative for abdominal pain, blood in stool and melena.  Genitourinary: Negative.  Negative for dysuria and hematuria.  Musculoskeletal: Negative.  Negative for back pain and neck pain.  Skin: Negative.  Negative for itching and rash.  Neurological: Positive for weakness. Negative for dizziness, sensory change,  focal weakness and headaches.  Endo/Heme/Allergies: Bruises/bleeds easily.  Psychiatric/Behavioral: Negative.  The patient is not nervous/anxious.     As per HPI. Otherwise, a complete review of systems is negative.  PAST MEDICAL HISTORY: Past Medical History:  Diagnosis Date   Acquired hand deformity 1962   hand saw accident at work   Anemia 10/11/2011   Arthritis    Bradycardia 10/10/2011   CAD (coronary artery disease) 10/10/2011   MI s/p PTCA (Dx-OM2 proximal concentric stenosis)   CKD (chronic kidney disease) stage 3, GFR 30-59 ml/min (HCC)    [name]   CLL (chronic lymphocytic leukemia) ([place])    Colon polyps    Generalized headaches    frequent   GI bleed 07/08/2017   Glaucoma    s/p laser surgery   HLD (hyperlipidemia)    Hypertension    Hypothyroidism 10/10/2011   Thrombocytopenia ([place]) 10/11/2011   Type 2 diabetes with nephropathy ([place])    DM refresher course [facility] (04/2013)    PAST SURGICAL HISTORY: Past Surgical History:  Procedure Laterality Date   BACK SURGERY     cervical neck   CATARACT EXTRACTION, BILATERAL Bilateral 2017   COLONOSCOPY  11/2008   1 polyp, diverticulosis, rec rpt 5 yrs (Dr. Oletta Lamas, [name])   COLONOSCOPY  06/2014   hyperplastic polyp, rpt 5 yrs ([place])   COLONOSCOPY WITH PROPOFOL N/A 11/17/2017   TA, HP, (Vanga, Tally Due, MD)   ESOPHAGOGASTRODUODENOSCOPY (EGD) WITH PROPOFOL N/A 11/17/2017   healing erosive gastritis, intestinal metaplasia, neg H pylori (Vanga, Tally Due, MD)   EYE SURGERY  2012   laser surgery for glaucoma   PTCA  1994, 1995   US ECHOCARDIOGRAPHY  10/2013   inferior wall hypokinesis, mild LVH,  EF 50-55%, mild MR and LA dilation    FAMILY HISTORY: Family History  Problem Relation Age of Onset   Diabetes Sister    Stomach cancer Sister 64   Pneumonia Father        caused death   Other Brother        no communication with brother so unsure of any health conditions   Coronary artery  disease Son 67       5v CABG and stents   Hyperlipidemia Sister    Stroke Neg Hx    Heart attack Neg Hx     ADVANCED DIRECTIVES (Y/N):  N  HEALTH MAINTENANCE: Social History   Tobacco Use   Smoking status: Former Smoker    Quit date: 03/04/1978    Years since quitting: 40.5   Smokeless tobacco: Never Used  Substance Use Topics   Alcohol use: No   Drug use: No     Colonoscopy:  PAP:  Bone density:  Lipid panel:  No Known Allergies  Current Outpatient Medications  Medication Sig Dispense Refill   acetaminophen (TYLENOL) 650 MG CR tablet Take 650 mg by mouth every 8 (eight) hours as needed for pain.     atorvastatin (LIPITOR) 80 MG tablet Take 80 mg by mouth daily.     carvedilol (COREG) 6.25 MG tablet TAKE 1 TABLET BY MOUTH TWICE DAILY (MAKE  ANNUAL  APPOINTMENT  FOR  FUTURE  REFILLS) 180 tablet 0   glucose blood (ONE TOUCH ULTRA TEST) test strip 1 each by Other route 4 (four) times daily. and as needed to check sugars Dx code:  E11.21 400 each 3   insulin aspart (NOVOLOG FLEXPEN) 100 UNIT/ML FlexPen Take 5 units with meals, take 10 units if mealtime sugar >250 15 mL 11   Insulin Detemir (LEVEMIR FLEXTOUCH) 100 UNIT/ML Pen INJECT 25 UNITS SUBCUTANEOUSLY ONCE DAILY AT  10PM 15 pen 3   levothyroxine (SYNTHROID) 88 MCG tablet Take 1 tablet (88 mcg total) by mouth daily. 90 tablet 1   nitroGLYCERIN (NITROSTAT) 0.4 MG SL tablet Place 1 tablet (0.4 mg total) under the tongue every 5 (five) minutes as needed for chest pain ((MAX of 3 doses)). 25 tablet 0   traZODone (DESYREL) 100 MG tablet Take 1 tablet (100 mg total) by mouth at bedtime as needed for sleep. 90 tablet 1   No current facility-administered medications for this visit.     OBJECTIVE: Vitals:   08/31/18 1018  BP: 118/63  Pulse: 73  Resp: 20  Temp: (!) 96.3 F (35.7 C)     Body mass index is 19.94 kg/m.    ECOG FS:0 - Asymptomatic  General: Well-developed, well-nourished, no acute  distress. Eyes: Pink conjunctiva, anicteric sclera. HEENT: Normocephalic, moist mucous membranes. Lungs: Clear to auscultation bilaterally. Heart: Regular rate and rhythm. No rubs, murmurs, or gallops. Abdomen: Soft, nontender, nondistended. No organomegaly noted, normoactive bowel sounds. Musculoskeletal: No edema, cyanosis, or clubbing. Neuro: Alert, answering all questions appropriately. Cranial nerves grossly intact. Skin: No rashes or petechiae noted.  Ecchymosis noted on bilateral upper extremities. Psych: Normal affect. Lymphatics: No cervical, calvicular, axillary or inguinal LAD.  LAB RESULTS:  Lab Results  Component Value Date   NA 144 08/31/2018   K 4.8 08/31/2018   CL 110 08/31/2018   CO2 26 08/31/2018   GLUCOSE 160 (H) 08/31/2018   BUN 38 (H) 08/31/2018   CREATININE 1.40 (H) 08/31/2018   CALCIUM 9.2 08/31/2018   PROT 6.4 (L) 08/31/2018   ALBUMIN 3.6  08/31/2018   AST 22 08/31/2018   ALT 16 08/31/2018   ALKPHOS 92 08/31/2018   BILITOT 1.1 08/31/2018   GFRNONAA 48 (L) 08/31/2018   GFRAA 55 (L) 08/31/2018    Lab Results  Component Value Date   WBC 6.5 08/31/2018   NEUTROABS 2.7 08/31/2018   HGB 7.3 (L) 08/31/2018   HCT 22.8 (L) 08/31/2018   MCV 97.4 08/31/2018   PLT 50 (L) 08/31/2018     STUDIES: No results found.  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  PLAN:    1. CLL: Repeat bone marrow biopsy on Jul 28, 2018 reviewed independently with 73% involvement of CLL.  Previous bone marrow biopsy on October 23, 2017 revealed only 31% involvement.  Previously, peripheral blood FISH testing 50% incidence of mutation in the ATM gene which is commonly associated with deletion 11q. 11q- is associated with an unfavorable prognosis and high risk of not responding to initial treatment. 13q- is a more common mutation and is actually associated with a more favorable prognosis. Patient's white blood cell count continues to be within normal limits.   Patient has clear progression of disease in his bone marrow along with a worsening transfusion requirement.  Initial plan was to give 4 weekly cycles of Rituxan, but will extend this an additional 1 to 2 weeks with hopes to improve his platelet count and further reduce his transfusion requirement. Can consider adding and Treanda for more durable response.  Proceed with cycle 4 of weekly Rituxan.  Return to clinic in 1 week for further evaluation and consideration of cycle 5. 2.  Thrombocytopenia: Platelet count is slowly improving and is now 50.  Patient has received high-dose prednisone, IVIG, and Rituxan with no appreciable durability to improve his platelet count.  Rituxan as above.   3.  Anemia: Hemoglobin is 7.3 today.  He does not require a transfusion this week. He previously reported black tarry stools, but none recently.  Patient had colonoscopy on November 17, 2017 that removed 2 nonmalignant polyps.  EGD on the same day revealed nonbleeding erosive gastropathy with multiple nonbleeding duodenal ulcers.  Consider referral back to GI after COVID-19.  Rituxan as above.   4.  Reaction to Rituxan: Rate-based.  Patient will require premedications for any further infusions with Rituxan.  These have been entered into his treatment plan. 5.  Easy bruising: Secondary to thrombocytopenia.  Monitor. 6.  Nosebleeds: Patient was given a referral back to ENT for further evaluation.  Patient expressed understanding and was in agreement with this plan. He also understands that He can call clinic at any time with any questions, concerns, or complaints.    Lloyd Huger, MD   08/31/2018 11:10 AM

## 2018-08-31 ENCOUNTER — Other Ambulatory Visit: Payer: Self-pay

## 2018-08-31 ENCOUNTER — Inpatient Hospital Stay: Payer: Medicare Other

## 2018-08-31 ENCOUNTER — Encounter: Payer: Self-pay | Admitting: Oncology

## 2018-08-31 ENCOUNTER — Inpatient Hospital Stay (HOSPITAL_BASED_OUTPATIENT_CLINIC_OR_DEPARTMENT_OTHER): Payer: Medicare Other | Admitting: Oncology

## 2018-08-31 ENCOUNTER — Ambulatory Visit: Payer: Medicare Other | Admitting: Oncology

## 2018-08-31 ENCOUNTER — Ambulatory Visit: Payer: Medicare Other

## 2018-08-31 ENCOUNTER — Other Ambulatory Visit: Payer: Medicare Other

## 2018-08-31 VITALS — BP 118/63 | HR 73 | Temp 96.3°F | Resp 20 | Wt 143.0 lb

## 2018-08-31 DIAGNOSIS — Z5112 Encounter for antineoplastic immunotherapy: Secondary | ICD-10-CM | POA: Diagnosis not present

## 2018-08-31 DIAGNOSIS — D630 Anemia in neoplastic disease: Secondary | ICD-10-CM | POA: Diagnosis not present

## 2018-08-31 DIAGNOSIS — C9110 Chronic lymphocytic leukemia of B-cell type not having achieved remission: Secondary | ICD-10-CM | POA: Diagnosis not present

## 2018-08-31 DIAGNOSIS — D649 Anemia, unspecified: Secondary | ICD-10-CM

## 2018-08-31 DIAGNOSIS — I2510 Atherosclerotic heart disease of native coronary artery without angina pectoris: Secondary | ICD-10-CM | POA: Diagnosis not present

## 2018-08-31 DIAGNOSIS — D693 Immune thrombocytopenic purpura: Secondary | ICD-10-CM

## 2018-08-31 DIAGNOSIS — N183 Chronic kidney disease, stage 3 (moderate): Secondary | ICD-10-CM | POA: Diagnosis not present

## 2018-08-31 LAB — CBC WITH DIFFERENTIAL/PLATELET
Abs Immature Granulocytes: 0.03 10*3/uL (ref 0.00–0.07)
Basophils Absolute: 0 10*3/uL (ref 0.0–0.1)
Basophils Relative: 0 %
Eosinophils Absolute: 0.1 10*3/uL (ref 0.0–0.5)
Eosinophils Relative: 2 %
HCT: 22.8 % — ABNORMAL LOW (ref 39.0–52.0)
Hemoglobin: 7.3 g/dL — ABNORMAL LOW (ref 13.0–17.0)
Immature Granulocytes: 1 %
Lymphocytes Relative: 53 %
Lymphs Abs: 3.5 10*3/uL (ref 0.7–4.0)
MCH: 31.2 pg (ref 26.0–34.0)
MCHC: 32 g/dL (ref 30.0–36.0)
MCV: 97.4 fL (ref 80.0–100.0)
Monocytes Absolute: 0.2 10*3/uL (ref 0.1–1.0)
Monocytes Relative: 3 %
Neutro Abs: 2.7 10*3/uL (ref 1.7–7.7)
Neutrophils Relative %: 41 %
Platelets: 50 10*3/uL — ABNORMAL LOW (ref 150–400)
RBC: 2.34 MIL/uL — ABNORMAL LOW (ref 4.22–5.81)
RDW: 22 % — ABNORMAL HIGH (ref 11.5–15.5)
Smear Review: NORMAL
WBC: 6.5 10*3/uL (ref 4.0–10.5)
nRBC: 0 % (ref 0.0–0.2)

## 2018-08-31 LAB — COMPREHENSIVE METABOLIC PANEL
ALT: 16 U/L (ref 0–44)
AST: 22 U/L (ref 15–41)
Albumin: 3.6 g/dL (ref 3.5–5.0)
Alkaline Phosphatase: 92 U/L (ref 38–126)
Anion gap: 8 (ref 5–15)
BUN: 38 mg/dL — ABNORMAL HIGH (ref 8–23)
CO2: 26 mmol/L (ref 22–32)
Calcium: 9.2 mg/dL (ref 8.9–10.3)
Chloride: 110 mmol/L (ref 98–111)
Creatinine, Ser: 1.4 mg/dL — ABNORMAL HIGH (ref 0.61–1.24)
GFR calc Af Amer: 55 mL/min — ABNORMAL LOW (ref >60)
GFR calc non Af Amer: 48 mL/min — ABNORMAL LOW (ref >60)
Glucose, Bld: 160 mg/dL — ABNORMAL HIGH (ref 70–99)
Potassium: 4.8 mmol/L (ref 3.5–5.1)
Sodium: 144 mmol/L (ref 135–145)
Total Bilirubin: 1.1 mg/dL (ref 0.3–1.2)
Total Protein: 6.4 g/dL — ABNORMAL LOW (ref 6.5–8.1)

## 2018-08-31 LAB — SAMPLE TO BLOOD BANK

## 2018-08-31 MED ORDER — FAMOTIDINE IN NACL 20-0.9 MG/50ML-% IV SOLN
20.0000 mg | Freq: Once | INTRAVENOUS | Status: AC
  Administered 2018-08-31: 12:00:00 20 mg via INTRAVENOUS
  Filled 2018-08-31: qty 50

## 2018-08-31 MED ORDER — DIPHENHYDRAMINE HCL 50 MG/ML IJ SOLN
25.0000 mg | Freq: Once | INTRAMUSCULAR | Status: AC
  Administered 2018-08-31: 12:00:00 25 mg via INTRAVENOUS
  Filled 2018-08-31: qty 1

## 2018-08-31 MED ORDER — ACETAMINOPHEN 325 MG PO TABS
650.0000 mg | ORAL_TABLET | Freq: Once | ORAL | Status: AC
  Administered 2018-08-31: 650 mg via ORAL
  Filled 2018-08-31: qty 2

## 2018-08-31 MED ORDER — SODIUM CHLORIDE 0.9 % IV SOLN
Freq: Once | INTRAVENOUS | Status: AC
  Administered 2018-08-31: 11:00:00 via INTRAVENOUS
  Filled 2018-08-31: qty 250

## 2018-08-31 MED ORDER — SODIUM CHLORIDE 0.9 % IV SOLN
375.0000 mg/m2 | Freq: Once | INTRAVENOUS | Status: AC
  Administered 2018-08-31: 12:00:00 700 mg via INTRAVENOUS
  Filled 2018-08-31: qty 50

## 2018-08-31 MED ORDER — DEXAMETHASONE SODIUM PHOSPHATE 10 MG/ML IJ SOLN
10.0000 mg | Freq: Once | INTRAMUSCULAR | Status: AC
  Administered 2018-08-31: 10 mg via INTRAVENOUS
  Filled 2018-08-31: qty 1

## 2018-08-31 NOTE — Progress Notes (Signed)
Patient here today for follow up regarding CLL. Patient reports 2 nosebleeds in the last week.

## 2018-09-01 ENCOUNTER — Other Ambulatory Visit: Payer: Self-pay | Admitting: Family Medicine

## 2018-09-01 DIAGNOSIS — E02 Subclinical iodine-deficiency hypothyroidism: Secondary | ICD-10-CM

## 2018-09-02 ENCOUNTER — Encounter: Payer: Self-pay | Admitting: Oncology

## 2018-09-03 ENCOUNTER — Other Ambulatory Visit: Payer: Self-pay

## 2018-09-05 NOTE — Progress Notes (Signed)
[place]  [phone] [phone]) [phone]  ID: Scott [name] OB: [birth year]  MR#: [id number]  HWY#:[id number]  Patient Care Team: [name], MD as PCP - General (Family Medicine) Lloyd Huger, MD as Consulting Physician (Oncology) Clent Jacks, MD as Consulting Physician (Ophthalmology) Dorothy Spark, MD as Consulting Physician (Cardiology) Madelon Lips, MD as Consulting Physician (Nephrology)  CHIEF COMPLAINT: 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  INTERVAL HISTORY: Patient returns to clinic today for further evaluation and his next infusion of weekly Rituxan.  He has a rash on his bilateral shins that is nontender.  He otherwise feels well.  He continues to have chronic weakness and fatigue. He has easy bruising, but denies any other bleeding.  He denies any recent fevers or illnesses.  He denies any dizziness or other neurologic complaints. He has a good appetite and denies weight loss. He denies any night sweats. He has noted no lymphadenopathy.  He has no chest pain, shortness of breath, cough, or hemoptysis.  He denies any nausea, vomiting, constipation, or diarrhea. He has no urinary complaints.  Patient offers no further specific complaints today.  REVIEW OF SYSTEMS:   Review of Systems  Constitutional: Positive for malaise/fatigue. Negative for fever and weight loss.  HENT: Negative.  Negative for nosebleeds.   Respiratory: Negative.  Negative for cough and shortness of breath.   Cardiovascular: Negative.  Negative for chest pain and leg swelling.  Gastrointestinal: Negative.  Negative for abdominal pain, blood in stool and melena.  Genitourinary: Negative.  Negative for dysuria and hematuria.  Musculoskeletal: Negative.  Negative for back pain and neck pain.  Skin: Positive for rash. Negative for itching.  Neurological: Positive for weakness. Negative for dizziness, sensory change, focal weakness and headaches.    Endo/Heme/Allergies: Bruises/bleeds easily.  Psychiatric/Behavioral: Negative.  The patient is not nervous/anxious.     As per HPI. Otherwise, a complete review of systems is negative.  PAST MEDICAL HISTORY: Past Medical History:  Diagnosis Date   Acquired hand deformity 1962   hand saw accident at work   Anemia 10/11/2011   Arthritis    Bradycardia 10/10/2011   CAD (coronary artery disease) 10/10/2011   MI s/p PTCA (Dx-OM2 proximal concentric stenosis)   CKD (chronic kidney disease) stage 3, GFR 30-59 ml/min (HCC)    [name]   CLL (chronic lymphocytic leukemia) ([place])    Colon polyps    Generalized headaches    frequent   GI bleed 07/08/2017   Glaucoma    s/p laser surgery   HLD (hyperlipidemia)    Hypertension    Hypothyroidism 10/10/2011   Thrombocytopenia ([place]) 10/11/2011   Type 2 diabetes with nephropathy ([place])    DM refresher course [facility] (04/2013)    PAST SURGICAL HISTORY: Past Surgical History:  Procedure Laterality Date   BACK SURGERY     cervical neck   CATARACT EXTRACTION, BILATERAL Bilateral 2017   COLONOSCOPY  11/2008   1 polyp, diverticulosis, rec rpt 5 yrs (Dr. Oletta Lamas, [name])   COLONOSCOPY  06/2014   hyperplastic polyp, rpt 5 yrs ([facility])   COLONOSCOPY WITH PROPOFOL N/A 11/17/2017   TA, HP, (Vanga, Tally Due, MD)   ESOPHAGOGASTRODUODENOSCOPY (EGD) WITH PROPOFOL N/A 11/17/2017   healing erosive gastritis, intestinal metaplasia, neg H pylori (Vanga, Tally Due, MD)   EYE SURGERY  2012   laser surgery for glaucoma   PTCA  1994, 1995   US ECHOCARDIOGRAPHY  10/2013   inferior wall hypokinesis, mild LVH, EF  mild MR and LA dilation  ° ° °FAMILY HISTORY: °Family History  °Problem Relation Age of Onset  °• Diabetes Sister   °• Stomach cancer Sister 78  °• Pneumonia Father   °     caused death  °• Other Brother   °     no communication with brother so unsure of any health conditions  °• Coronary artery disease Son 46  °     5v CABG and  stents  °• Hyperlipidemia Sister   °• Stroke Neg Hx   °• Heart attack Neg Hx   ° ° °ADVANCED DIRECTIVES (Y/N):  N ° °HEALTH MAINTENANCE: °Social History  ° °Tobacco Use  °• Smoking status: Former Smoker  °  Quit date: 03/04/1978  °  Years since quitting: 40.5  °• Smokeless tobacco: Never Used  °Substance Use Topics  °• Alcohol use: No  °• Drug use: No  ° ° ° Colonoscopy: ° PAP: ° Bone density: ° Lipid panel: ° °No Known Allergies ° °Current Outpatient Medications  °Medication Sig Dispense Refill  °• acetaminophen (TYLENOL) 650 MG CR tablet Take 650 mg by mouth every 8 (eight) hours as needed for pain.    °• atorvastatin (LIPITOR) 80 MG tablet Take 80 mg by mouth daily.    °• carvedilol (COREG) 6.25 MG tablet TAKE 1 TABLET BY MOUTH TWICE DAILY (MAKE  ANNUAL  APPOINTMENT  FOR  FUTURE  REFILLS) 180 tablet 0  °• glucose blood (ONE TOUCH ULTRA TEST) test strip 1 each by Other route 4 (four) times daily. and as needed to check sugars Dx code:  E11.21 400 each 3  °• insulin aspart (NOVOLOG FLEXPEN) 100 UNIT/ML FlexPen Take 5 units with meals, take 10 units if mealtime sugar >250 15 mL 11  °• Insulin Detemir (LEVEMIR FLEXTOUCH) 100 UNIT/ML Pen INJECT 25 UNITS SUBCUTANEOUSLY ONCE DAILY AT  10PM 15 pen 3  °• levothyroxine (SYNTHROID) 88 MCG tablet Take 1 tablet (88 mcg total) by mouth daily. 90 tablet 1  °• nitroGLYCERIN (NITROSTAT) 0.4 MG SL tablet Place 1 tablet (0.4 mg total) under the tongue every 5 (five) minutes as needed for chest pain ((MAX of 3 doses)). 25 tablet 0  °• traZODone (DESYREL) 100 MG tablet Take 1 tablet (100 mg total) by mouth at bedtime as needed for sleep. 90 tablet 1  ° °No current facility-administered medications for this visit.   ° ° °OBJECTIVE: °Vitals:  ° 09/07/18 0927  °BP: 109/61  °Pulse: 89  °Temp: (!) 96.9 °F (36.1 °C)  °   Body mass index is 20.36 kg/m².    ECOG FS:0 - Asymptomatic ° °General: Well-developed, well-nourished, no acute distress. °Eyes: Pink conjunctiva, anicteric sclera. °HEENT:  Normocephalic, moist mucous membranes, clear oropharnyx. °Lungs: Clear to auscultation bilaterally. °Heart: Regular rate and rhythm. No rubs, murmurs, or gallops. °Abdomen: Soft, nontender, nondistended. No organomegaly noted, normoactive bowel sounds. °Musculoskeletal: No edema, cyanosis, or clubbing. °Neuro: Alert, answering all questions appropriately. Cranial nerves grossly intact. °Skin: Bilateral shin with possible rash. °Psych: Normal affect. ° °LAB RESULTS: ° °Lab Results  °Component Value Date  ° NA 142 09/07/2018  ° K 5.2 (H) 09/07/2018  ° CL 107 09/07/2018  ° CO2 28 09/07/2018  ° GLUCOSE 227 (H) 09/07/2018  ° BUN 42 (H) 09/07/2018  ° CREATININE 1.38 (H) 09/07/2018  ° CALCIUM 9.0 09/07/2018  ° PROT 5.8 (L) 09/07/2018  ° ALBUMIN 3.5 09/07/2018  ° AST 19 09/07/2018  ° ALT 17 09/07/2018  ° ALKPHOS 90 09/07/2018  °   BILITOT 1.2 09/07/2018  ° GFRNONAA 49 (L) 09/07/2018  ° GFRAA 56 (L) 09/07/2018  ° ° °Lab Results  °Component Value Date  ° WBC 4.1 09/07/2018  ° NEUTROABS 2.2 09/07/2018  ° HGB 7.0 (L) 09/07/2018  ° HCT 22.5 (L) 09/07/2018  ° MCV 100.9 (H) 09/07/2018  ° PLT 33 (L) 09/07/2018  ° ° ° °STUDIES: °No results found. ° °ASSESSMENT: CLL with ATM (11q-) mutation and 13q-, anemia, ITP. ° °PLAN:   ° °1. CLL: Repeat bone marrow biopsy on Jul 28, 2018 reviewed independently with 73% involvement of CLL.  Previous bone marrow biopsy on October 23, 2017 revealed only 31% involvement.  Previously, peripheral blood FISH testing 50% incidence of mutation in the ATM gene which is commonly associated with deletion 11q. 11q- is associated with an unfavorable prognosis and high risk of not responding to initial treatment. 13q- is a more common mutation and is actually associated with a more favorable prognosis. Patient's white blood cell count continues to be within normal limits.  Patient has clear progression of disease in his bone marrow along with a worsening transfusion requirement.  Initial plan was to give 4 weekly  cycles of Rituxan, but will extend this an additional 1 to 2 weeks with hopes to improve his platelet count and further reduce his transfusion requirement.  Proceed with cycle 5 of weekly Rituxan.  Patient will return to clinic in 1 week for further evaluation as well as the addition of Treanda to his treatment. °2.  Thrombocytopenia: Platelet count trending down now.  Patient has received high-dose prednisone, IVIG, and Rituxan with no appreciable durability to improve his platelet count.  Rituxan as above.   °3.  Anemia: Hemoglobin is 7.0 today.  He does not require a transfusion this week. He previously reported black tarry stools, but none recently.  Patient had colonoscopy on November 17, 2017 that removed 2 nonmalignant polyps.  EGD on the same day revealed nonbleeding erosive gastropathy with multiple nonbleeding duodenal ulcers.  Consider referral back to GI after COVID-19.  Rituxan as above.   °4.  Reaction to Rituxan: Rate-based.  Patient will require premedications for any further infusions with Rituxan.  These have been entered into his treatment plan. °5.  Easy bruising: Secondary to thrombocytopenia.  Monitor. °6.  Nosebleeds: Patient was given a referral back to ENT for further evaluation. °7.  Rash: Unclear etiology, possibly ringworm or fungal infection.  Have recommended topical treatments. ° °Patient expressed understanding and was in agreement with this plan. He also understands that He can call clinic at any time with any questions, concerns, or complaints.  ° ° °Timothy J Finnegan, MD   09/07/2018 12:44 PM ° ° ° ° °

## 2018-09-07 ENCOUNTER — Inpatient Hospital Stay: Payer: Medicare Other | Attending: Oncology

## 2018-09-07 ENCOUNTER — Inpatient Hospital Stay: Payer: Medicare Other

## 2018-09-07 ENCOUNTER — Inpatient Hospital Stay (HOSPITAL_BASED_OUTPATIENT_CLINIC_OR_DEPARTMENT_OTHER): Payer: Medicare Other | Admitting: Oncology

## 2018-09-07 ENCOUNTER — Encounter: Payer: Self-pay | Admitting: Oncology

## 2018-09-07 ENCOUNTER — Other Ambulatory Visit: Payer: Self-pay | Admitting: Oncology

## 2018-09-07 ENCOUNTER — Other Ambulatory Visit: Payer: Self-pay

## 2018-09-07 VITALS — BP 109/61 | HR 89 | Temp 96.9°F | Ht 71.0 in | Wt 146.0 lb

## 2018-09-07 VITALS — BP 160/75 | HR 64 | Temp 96.4°F | Resp 18

## 2018-09-07 DIAGNOSIS — E1122 Type 2 diabetes mellitus with diabetic chronic kidney disease: Secondary | ICD-10-CM

## 2018-09-07 DIAGNOSIS — Z8249 Family history of ischemic heart disease and other diseases of the circulatory system: Secondary | ICD-10-CM | POA: Insufficient documentation

## 2018-09-07 DIAGNOSIS — Z5111 Encounter for antineoplastic chemotherapy: Secondary | ICD-10-CM | POA: Insufficient documentation

## 2018-09-07 DIAGNOSIS — R5383 Other fatigue: Secondary | ICD-10-CM | POA: Insufficient documentation

## 2018-09-07 DIAGNOSIS — I2510 Atherosclerotic heart disease of native coronary artery without angina pectoris: Secondary | ICD-10-CM | POA: Insufficient documentation

## 2018-09-07 DIAGNOSIS — Z825 Family history of asthma and other chronic lower respiratory diseases: Secondary | ICD-10-CM | POA: Insufficient documentation

## 2018-09-07 DIAGNOSIS — Z800 Family history of malignant neoplasm of digestive organs: Secondary | ICD-10-CM

## 2018-09-07 DIAGNOSIS — K319 Disease of stomach and duodenum, unspecified: Secondary | ICD-10-CM

## 2018-09-07 DIAGNOSIS — K269 Duodenal ulcer, unspecified as acute or chronic, without hemorrhage or perforation: Secondary | ICD-10-CM | POA: Insufficient documentation

## 2018-09-07 DIAGNOSIS — Z79899 Other long term (current) drug therapy: Secondary | ICD-10-CM | POA: Diagnosis not present

## 2018-09-07 DIAGNOSIS — R531 Weakness: Secondary | ICD-10-CM

## 2018-09-07 DIAGNOSIS — D631 Anemia in chronic kidney disease: Secondary | ICD-10-CM | POA: Insufficient documentation

## 2018-09-07 DIAGNOSIS — R110 Nausea: Secondary | ICD-10-CM | POA: Diagnosis not present

## 2018-09-07 DIAGNOSIS — Z5112 Encounter for antineoplastic immunotherapy: Secondary | ICD-10-CM | POA: Insufficient documentation

## 2018-09-07 DIAGNOSIS — Z833 Family history of diabetes mellitus: Secondary | ICD-10-CM | POA: Insufficient documentation

## 2018-09-07 DIAGNOSIS — R21 Rash and other nonspecific skin eruption: Secondary | ICD-10-CM | POA: Insufficient documentation

## 2018-09-07 DIAGNOSIS — Z794 Long term (current) use of insulin: Secondary | ICD-10-CM | POA: Diagnosis not present

## 2018-09-07 DIAGNOSIS — M1990 Unspecified osteoarthritis, unspecified site: Secondary | ICD-10-CM | POA: Diagnosis not present

## 2018-09-07 DIAGNOSIS — K5900 Constipation, unspecified: Secondary | ICD-10-CM | POA: Diagnosis not present

## 2018-09-07 DIAGNOSIS — E785 Hyperlipidemia, unspecified: Secondary | ICD-10-CM | POA: Diagnosis not present

## 2018-09-07 DIAGNOSIS — Z8719 Personal history of other diseases of the digestive system: Secondary | ICD-10-CM

## 2018-09-07 DIAGNOSIS — C9110 Chronic lymphocytic leukemia of B-cell type not having achieved remission: Secondary | ICD-10-CM

## 2018-09-07 DIAGNOSIS — D699 Hemorrhagic condition, unspecified: Secondary | ICD-10-CM | POA: Insufficient documentation

## 2018-09-07 DIAGNOSIS — I129 Hypertensive chronic kidney disease with stage 1 through stage 4 chronic kidney disease, or unspecified chronic kidney disease: Secondary | ICD-10-CM | POA: Insufficient documentation

## 2018-09-07 DIAGNOSIS — D693 Immune thrombocytopenic purpura: Secondary | ICD-10-CM | POA: Diagnosis not present

## 2018-09-07 DIAGNOSIS — N183 Chronic kidney disease, stage 3 (moderate): Secondary | ICD-10-CM | POA: Insufficient documentation

## 2018-09-07 DIAGNOSIS — R109 Unspecified abdominal pain: Secondary | ICD-10-CM | POA: Insufficient documentation

## 2018-09-07 DIAGNOSIS — Z7189 Other specified counseling: Secondary | ICD-10-CM

## 2018-09-07 DIAGNOSIS — Z87891 Personal history of nicotine dependence: Secondary | ICD-10-CM

## 2018-09-07 DIAGNOSIS — Z83438 Family history of other disorder of lipoprotein metabolism and other lipidemia: Secondary | ICD-10-CM

## 2018-09-07 LAB — CBC WITH DIFFERENTIAL/PLATELET
Abs Immature Granulocytes: 0.01 10*3/uL (ref 0.00–0.07)
Basophils Absolute: 0 10*3/uL (ref 0.0–0.1)
Basophils Relative: 0 %
Eosinophils Absolute: 0.1 10*3/uL (ref 0.0–0.5)
Eosinophils Relative: 3 %
HCT: 22.5 % — ABNORMAL LOW (ref 39.0–52.0)
Hemoglobin: 7 g/dL — ABNORMAL LOW (ref 13.0–17.0)
Immature Granulocytes: 0 %
Lymphocytes Relative: 38 %
Lymphs Abs: 1.6 10*3/uL (ref 0.7–4.0)
MCH: 31.4 pg (ref 26.0–34.0)
MCHC: 31.1 g/dL (ref 30.0–36.0)
MCV: 100.9 fL — ABNORMAL HIGH (ref 80.0–100.0)
Monocytes Absolute: 0.2 10*3/uL (ref 0.1–1.0)
Monocytes Relative: 6 %
Neutro Abs: 2.2 10*3/uL (ref 1.7–7.7)
Neutrophils Relative %: 53 %
Platelets: 33 10*3/uL — ABNORMAL LOW (ref 150–400)
RBC: 2.23 MIL/uL — ABNORMAL LOW (ref 4.22–5.81)
RDW: 22.5 % — ABNORMAL HIGH (ref 11.5–15.5)
Smear Review: NORMAL
WBC Morphology: ABNORMAL
WBC: 4.1 10*3/uL (ref 4.0–10.5)
nRBC: 0 % (ref 0.0–0.2)

## 2018-09-07 LAB — COMPREHENSIVE METABOLIC PANEL
ALT: 17 U/L (ref 0–44)
AST: 19 U/L (ref 15–41)
Albumin: 3.5 g/dL (ref 3.5–5.0)
Alkaline Phosphatase: 90 U/L (ref 38–126)
Anion gap: 7 (ref 5–15)
BUN: 42 mg/dL — ABNORMAL HIGH (ref 8–23)
CO2: 28 mmol/L (ref 22–32)
Calcium: 9 mg/dL (ref 8.9–10.3)
Chloride: 107 mmol/L (ref 98–111)
Creatinine, Ser: 1.38 mg/dL — ABNORMAL HIGH (ref 0.61–1.24)
GFR calc Af Amer: 56 mL/min — ABNORMAL LOW (ref >60)
GFR calc non Af Amer: 49 mL/min — ABNORMAL LOW (ref >60)
Glucose, Bld: 227 mg/dL — ABNORMAL HIGH (ref 70–99)
Potassium: 5.2 mmol/L — ABNORMAL HIGH (ref 3.5–5.1)
Sodium: 142 mmol/L (ref 135–145)
Total Bilirubin: 1.2 mg/dL (ref 0.3–1.2)
Total Protein: 5.8 g/dL — ABNORMAL LOW (ref 6.5–8.1)

## 2018-09-07 MED ORDER — ACETAMINOPHEN 325 MG PO TABS
650.0000 mg | ORAL_TABLET | Freq: Once | ORAL | Status: AC
  Administered 2018-09-07: 650 mg via ORAL
  Filled 2018-09-07: qty 2

## 2018-09-07 MED ORDER — DIPHENHYDRAMINE HCL 50 MG/ML IJ SOLN
25.0000 mg | Freq: Once | INTRAMUSCULAR | Status: AC
  Administered 2018-09-07: 11:00:00 25 mg via INTRAVENOUS
  Filled 2018-09-07: qty 1

## 2018-09-07 MED ORDER — DEXAMETHASONE SODIUM PHOSPHATE 10 MG/ML IJ SOLN
10.0000 mg | Freq: Once | INTRAMUSCULAR | Status: AC
  Administered 2018-09-07: 10 mg via INTRAVENOUS
  Filled 2018-09-07: qty 1

## 2018-09-07 MED ORDER — FAMOTIDINE IN NACL 20-0.9 MG/50ML-% IV SOLN
20.0000 mg | Freq: Once | INTRAVENOUS | Status: AC
  Administered 2018-09-07: 20 mg via INTRAVENOUS
  Filled 2018-09-07: qty 50

## 2018-09-07 MED ORDER — ONDANSETRON HCL 8 MG PO TABS: 8.0000 mg | ORAL_TABLET | Freq: Two times a day (BID) | ORAL | 2 refills | Status: DC | PRN

## 2018-09-07 MED ORDER — PROCHLORPERAZINE MALEATE 10 MG PO TABS: 10.0000 mg | ORAL_TABLET | Freq: Four times a day (QID) | ORAL | 2 refills | Status: DC | PRN

## 2018-09-07 MED ORDER — SODIUM CHLORIDE 0.9 % IV SOLN
Freq: Once | INTRAVENOUS | Status: AC
  Administered 2018-09-07: 11:00:00 via INTRAVENOUS
  Filled 2018-09-07: qty 250

## 2018-09-07 MED ORDER — SODIUM CHLORIDE 0.9 % IV SOLN
375.0000 mg/m2 | Freq: Once | INTRAVENOUS | Status: AC
  Administered 2018-09-07: 700 mg via INTRAVENOUS
  Filled 2018-09-07: qty 20

## 2018-09-07 NOTE — Progress Notes (Signed)
START OFF PATHWAY REGIMEN - Lymphoma and CLL   [id number]:Bendamustine + Rituximab (100/375) q21 Days:   A cycle is every 21 days:     Bendamustine      Rituximab-xxxx   **Always confirm dose/schedule in your pharmacy ordering system**  Patient Characteristics: Chronic Lymphocytic Leukemia (CLL), First Line, Treatment Indicated, 17p del (+) or ATM Mutation Positive Disease Type: Chronic Lymphocytic Leukemia (CLL) Disease Type: Not Applicable Disease Type: Not Applicable Line of Therapy: First Line RAI Stage: III Treatment Indicated<= Treatment Indicated ATM Mutation Status: Positive 17p Deletion Status: Negative Intent of Therapy: Non-Curative / Palliative Intent, Discussed with Patient

## 2018-09-07 NOTE — Progress Notes (Signed)
Patient stated that over all he'd been doing well, except for a rash that he has developed on his bilateral lower extremities. Patient stated that they do not itch, hurt or swell.

## 2018-09-11 ENCOUNTER — Other Ambulatory Visit: Payer: Self-pay

## 2018-09-13 NOTE — Progress Notes (Signed)
[place]  Telephone:(336) [phone] Fax:(336) [phone]  ID: [name] OB: 1941/02/02  MR#: [id number]  ZJQ#:[id number]  Patient Care Team: [name], MD as PCP - General (Family Medicine) Lloyd Huger, MD as Consulting Physician (Oncology) Clent Jacks, MD as Consulting Physician (Ophthalmology) Dorothy Spark, MD as Consulting Physician (Cardiology) Madelon Lips, MD as Consulting Physician (Nephrology)  CHIEF COMPLAINT: 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  INTERVAL HISTORY: Patient returns to clinic today for further evaluation and consideration of cycle 1, day 1 of Treanda and Rituxan.  The rash on his bilateral lower extremity is nearly resolved.  He continues to have chronic weakness and fatigue, but otherwise feels well.  He denies any recent fevers or illnesses.  He denies any dizziness or other neurologic complaints. He has a good appetite and denies weight loss. He denies any night sweats. He has noted no lymphadenopathy.  He has no chest pain, shortness of breath, cough, or hemoptysis.  He denies any nausea, vomiting, constipation, or diarrhea. He has no urinary complaints.  Patient offers no further specific complaints today.  REVIEW OF SYSTEMS:   Review of Systems  Constitutional: Positive for malaise/fatigue. Negative for fever and weight loss.  HENT: Negative.  Negative for nosebleeds.   Respiratory: Negative.  Negative for cough and shortness of breath.   Cardiovascular: Negative.  Negative for chest pain and leg swelling.  Gastrointestinal: Negative.  Negative for abdominal pain, blood in stool and melena.  Genitourinary: Negative.  Negative for dysuria and hematuria.  Musculoskeletal: Negative.  Negative for back pain and neck pain.  Skin: Negative.  Negative for itching and rash.  Neurological: Positive for weakness. Negative for dizziness, sensory change, focal weakness and headaches.  Endo/Heme/Allergies:  Bruises/bleeds easily.  Psychiatric/Behavioral: Negative.  The patient is not nervous/anxious.     As per HPI. Otherwise, a complete review of systems is negative.  PAST MEDICAL HISTORY: Past Medical History:  Diagnosis Date  . Acquired hand deformity 1962   hand saw accident at work  . Anemia 10/11/2011  . Arthritis   . Bradycardia 10/10/2011  . CAD (coronary artery disease) 10/10/2011   MI s/p PTCA (Dx-OM2 proximal concentric stenosis)  . CKD (chronic kidney disease) stage 3, GFR 30-59 ml/min (HCC)    [name]  . CLL (chronic lymphocytic leukemia) ([place])   . Colon polyps   . Generalized headaches    frequent  . GI bleed 07/08/2017  . Glaucoma    s/p laser surgery  . HLD (hyperlipidemia)   . Hypertension   . Hypothyroidism 10/10/2011  . Thrombocytopenia ([place]) 10/11/2011  . Type 2 diabetes with nephropathy [facility])    DM refresher course [facility] (04/2013)    PAST SURGICAL HISTORY: Past Surgical History:  Procedure Laterality Date  . BACK SURGERY     cervical neck  . CATARACT EXTRACTION, BILATERAL Bilateral 2017  . COLONOSCOPY  11/2008   1 polyp, diverticulosis, rec rpt 5 yrs (Dr. Oletta Lamas, [name])  . COLONOSCOPY  06/2014   hyperplastic polyp, rpt 5 yrs ([facility])  . COLONOSCOPY WITH PROPOFOL N/A 11/17/2017   TA, HP, (Vanga, Tally Due, MD)  . ESOPHAGOGASTRODUODENOSCOPY (EGD) WITH PROPOFOL N/A 11/17/2017   healing erosive gastritis, intestinal metaplasia, neg H pylori (Vanga, Tally Due, MD)  . EYE SURGERY  2012   laser surgery for glaucoma  . PTCA  1994, 1995  . US ECHOCARDIOGRAPHY  10/2013   inferior wall hypokinesis, mild LVH, EF 50-55%, mild MR and LA dilation  FAMILY HISTORY: Family History  Problem Relation Age of Onset  . Diabetes Sister   . Stomach cancer Sister 9  . Pneumonia Father        caused death  . Other Brother        no communication with brother so unsure of any health conditions  . Coronary artery disease Son 9       5v CABG and stents  .  Hyperlipidemia Sister   . Stroke Neg Hx   . Heart attack Neg Hx     ADVANCED DIRECTIVES (Y/N):  N  HEALTH MAINTENANCE: Social History   Tobacco Use  . Smoking status: Former Smoker    Quit date: 03/04/1978    Years since quitting: 40.5  . Smokeless tobacco: Never Used  Substance Use Topics  . Alcohol use: No  . Drug use: No     Colonoscopy:  PAP:  Bone density:  Lipid panel:  No Known Allergies  Current Outpatient Medications  Medication Sig Dispense Refill  . acetaminophen (TYLENOL) 650 MG CR tablet Take 650 mg by mouth every 8 (eight) hours as needed for pain.    Marland Kitchen atorvastatin (LIPITOR) 80 MG tablet Take 80 mg by mouth daily.    . carvedilol (COREG) 6.25 MG tablet TAKE 1 TABLET BY MOUTH TWICE DAILY (MAKE  ANNUAL  APPOINTMENT  FOR  FUTURE  REFILLS) 180 tablet 0  . glucose blood (ONE TOUCH ULTRA TEST) test strip 1 each by Other route 4 (four) times daily. and as needed to check sugars Dx code:  E11.21 400 each 3  . insulin aspart (NOVOLOG FLEXPEN) 100 UNIT/ML FlexPen Take 5 units with meals, take 10 units if mealtime sugar >250 15 mL 11  . Insulin Detemir (LEVEMIR FLEXTOUCH) 100 UNIT/ML Pen INJECT 25 UNITS SUBCUTANEOUSLY ONCE DAILY AT  10PM 15 pen 3  . levothyroxine (SYNTHROID) 88 MCG tablet Take 1 tablet (88 mcg total) by mouth daily. 90 tablet 1  . nitroGLYCERIN (NITROSTAT) 0.4 MG SL tablet Place 1 tablet (0.4 mg total) under the tongue every 5 (five) minutes as needed for chest pain ((MAX of 3 doses)). 25 tablet 0  . traZODone (DESYREL) 100 MG tablet Take 1 tablet (100 mg total) by mouth at bedtime as needed for sleep. 90 tablet 1  . ondansetron (ZOFRAN) 8 MG tablet Take 1 tablet (8 mg total) by mouth 2 (two) times daily as needed for refractory nausea / vomiting. (Patient not taking: Reported on 09/14/2018) 30 tablet 2  . prochlorperazine (COMPAZINE) 10 MG tablet Take 1 tablet (10 mg total) by mouth every 6 (six) hours as needed (Nausea or vomiting). (Patient not taking:  Reported on 09/14/2018) 60 tablet 2   No current facility-administered medications for this visit.     OBJECTIVE: Vitals:   09/14/18 0916  BP: (!) 124/55  Pulse: 61  Resp: 18     Body mass index is 20.5 kg/m.    ECOG FS:0 - Asymptomatic  General: Well-developed, well-nourished, no acute distress. Eyes: Pink conjunctiva, anicteric sclera. HEENT: Normocephalic, moist mucous membranes. Lungs: Clear to auscultation bilaterally. Heart: Regular rate and rhythm. No rubs, murmurs, or gallops. Abdomen: Soft, nontender, nondistended. No organomegaly noted, normoactive bowel sounds. Musculoskeletal: No edema, cyanosis, or clubbing. Neuro: Alert, answering all questions appropriately. Cranial nerves grossly intact. Skin: Rash on skin nearly resolved. Psych: Normal affect.  LAB RESULTS:  Lab Results  Component Value Date   NA 143 09/14/2018   K 4.3 09/14/2018   CL 111 09/14/2018  CO2 27 09/14/2018   GLUCOSE 116 (H) 09/14/2018   BUN 39 (H) 09/14/2018   CREATININE 1.32 (H) 09/14/2018   CALCIUM 9.3 09/14/2018   PROT 6.1 (L) 09/14/2018   ALBUMIN 3.4 (L) 09/14/2018   AST 20 09/14/2018   ALT 16 09/14/2018   ALKPHOS 82 09/14/2018   BILITOT 0.8 09/14/2018   GFRNONAA 51 (L) 09/14/2018   GFRAA 59 (L) 09/14/2018    Lab Results  Component Value Date   WBC 4.1 09/14/2018   NEUTROABS PENDING 09/14/2018   HGB 6.9 (L) 09/14/2018   HCT 22.3 (L) 09/14/2018   MCV 101.4 (H) 09/14/2018   PLT 54 (L) 09/14/2018     STUDIES: No results found.  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  PLAN:    1. CLL: Repeat bone marrow biopsy on Jul 28, 2018 reviewed independently with 73% involvement of CLL.  Previous bone marrow biopsy on October 23, 2017 revealed only 31% involvement.  Previously, peripheral blood FISH testing 50% incidence of mutation in the ATM gene which is commonly associated with deletion 11q. 11q- is associated with an unfavorable prognosis and high risk of  not responding to initial treatment. 13q- is a more common mutation and is actually associated with a more favorable prognosis. Patient's white blood cell count continues to be within normal limits.  Patient has clear progression of disease in his bone marrow along with a worsening transfusion requirement.  Patient received 5 weekly cycles of Rituxan with mild improvement of his platelet count.  We will proceed with cycle 1, day 1 of Rituxan and Treanda today.  Return to clinic tomorrow for Treanda and blood transfusion.  Patient will then return to clinic in 1 week for repeat laboratory work and further evaluation and then again in 3 weeks for consideration of cycle 2, day 1.   2.  Thrombocytopenia: Mildly improved.  Patient has received high-dose prednisone, IVIG, and Rituxan with no appreciable durability to improve his platelet count.  Proceed with Treanda and Rituxan cautiously.  Expect platelet count to possibly get worse before improving. 3.  Anemia: Hemoglobin is 6.9 today.  Return to clinic on Thursday for 1 unit of packed red blood cells.  He previously reported black tarry stools, but none recently.  Patient had colonoscopy on November 17, 2017 that removed 2 nonmalignant polyps.  EGD on the same day revealed nonbleeding erosive gastropathy with multiple nonbleeding duodenal ulcers.  Consider referral back to GI after COVID-19 and chemotherapy is complete.  Rituxan as above.   4.  Reaction to Rituxan: Rate-based.  Patient will require premedications for any further infusions with Rituxan.  These have been entered into his treatment plan. 5.  Easy bruising: Secondary to thrombocytopenia.  Monitor. 6.  Nosebleeds: Patient does not complain of this this week.  Patient was given a referral back to ENT for further evaluation. 7.  Rash: Resolving.  Patient has been instructed that if rash becomes worse to use over-the-counter antifungal cream.  Patient expressed understanding and was in agreement with  this plan. He also understands that He can call clinic at any time with any questions, concerns, or complaints.    Lloyd Huger, MD   09/14/2018 9:39 AM

## 2018-09-14 ENCOUNTER — Inpatient Hospital Stay: Payer: Medicare Other

## 2018-09-14 ENCOUNTER — Encounter: Payer: Self-pay | Admitting: Oncology

## 2018-09-14 ENCOUNTER — Other Ambulatory Visit: Payer: Self-pay

## 2018-09-14 ENCOUNTER — Other Ambulatory Visit: Payer: Self-pay | Admitting: Oncology

## 2018-09-14 ENCOUNTER — Inpatient Hospital Stay (HOSPITAL_BASED_OUTPATIENT_CLINIC_OR_DEPARTMENT_OTHER): Payer: Medicare Other | Admitting: Oncology

## 2018-09-14 VITALS — BP 124/55 | HR 61 | Resp 18 | Wt 147.0 lb

## 2018-09-14 DIAGNOSIS — M1990 Unspecified osteoarthritis, unspecified site: Secondary | ICD-10-CM

## 2018-09-14 DIAGNOSIS — Z5112 Encounter for antineoplastic immunotherapy: Secondary | ICD-10-CM | POA: Diagnosis not present

## 2018-09-14 DIAGNOSIS — Z800 Family history of malignant neoplasm of digestive organs: Secondary | ICD-10-CM

## 2018-09-14 DIAGNOSIS — Z794 Long term (current) use of insulin: Secondary | ICD-10-CM

## 2018-09-14 DIAGNOSIS — D631 Anemia in chronic kidney disease: Secondary | ICD-10-CM

## 2018-09-14 DIAGNOSIS — Z825 Family history of asthma and other chronic lower respiratory diseases: Secondary | ICD-10-CM

## 2018-09-14 DIAGNOSIS — R110 Nausea: Secondary | ICD-10-CM | POA: Diagnosis not present

## 2018-09-14 DIAGNOSIS — K319 Disease of stomach and duodenum, unspecified: Secondary | ICD-10-CM

## 2018-09-14 DIAGNOSIS — R21 Rash and other nonspecific skin eruption: Secondary | ICD-10-CM

## 2018-09-14 DIAGNOSIS — Z83438 Family history of other disorder of lipoprotein metabolism and other lipidemia: Secondary | ICD-10-CM

## 2018-09-14 DIAGNOSIS — Z5111 Encounter for antineoplastic chemotherapy: Secondary | ICD-10-CM | POA: Diagnosis not present

## 2018-09-14 DIAGNOSIS — Z87891 Personal history of nicotine dependence: Secondary | ICD-10-CM

## 2018-09-14 DIAGNOSIS — Z833 Family history of diabetes mellitus: Secondary | ICD-10-CM

## 2018-09-14 DIAGNOSIS — I129 Hypertensive chronic kidney disease with stage 1 through stage 4 chronic kidney disease, or unspecified chronic kidney disease: Secondary | ICD-10-CM

## 2018-09-14 DIAGNOSIS — D693 Immune thrombocytopenic purpura: Secondary | ICD-10-CM

## 2018-09-14 DIAGNOSIS — D699 Hemorrhagic condition, unspecified: Secondary | ICD-10-CM | POA: Diagnosis not present

## 2018-09-14 DIAGNOSIS — C9110 Chronic lymphocytic leukemia of B-cell type not having achieved remission: Secondary | ICD-10-CM

## 2018-09-14 DIAGNOSIS — E785 Hyperlipidemia, unspecified: Secondary | ICD-10-CM

## 2018-09-14 DIAGNOSIS — E1122 Type 2 diabetes mellitus with diabetic chronic kidney disease: Secondary | ICD-10-CM

## 2018-09-14 DIAGNOSIS — R5383 Other fatigue: Secondary | ICD-10-CM | POA: Diagnosis not present

## 2018-09-14 DIAGNOSIS — D649 Anemia, unspecified: Secondary | ICD-10-CM

## 2018-09-14 DIAGNOSIS — N183 Chronic kidney disease, stage 3 (moderate): Secondary | ICD-10-CM

## 2018-09-14 DIAGNOSIS — I2510 Atherosclerotic heart disease of native coronary artery without angina pectoris: Secondary | ICD-10-CM

## 2018-09-14 DIAGNOSIS — Z79899 Other long term (current) drug therapy: Secondary | ICD-10-CM

## 2018-09-14 DIAGNOSIS — K269 Duodenal ulcer, unspecified as acute or chronic, without hemorrhage or perforation: Secondary | ICD-10-CM

## 2018-09-14 DIAGNOSIS — R531 Weakness: Secondary | ICD-10-CM

## 2018-09-14 DIAGNOSIS — R109 Unspecified abdominal pain: Secondary | ICD-10-CM | POA: Diagnosis not present

## 2018-09-14 DIAGNOSIS — Z8719 Personal history of other diseases of the digestive system: Secondary | ICD-10-CM

## 2018-09-14 LAB — CBC WITH DIFFERENTIAL/PLATELET
Band Neutrophils: 0 %
Basophils Absolute: 0 10*3/uL (ref 0.0–0.1)
Basophils Relative: 0 %
Blasts: 0 %
Eosinophils Absolute: 0 10*3/uL (ref 0.0–0.5)
Eosinophils Relative: 1 %
HCT: 22.3 % — ABNORMAL LOW (ref 39.0–52.0)
Hemoglobin: 6.9 g/dL — ABNORMAL LOW (ref 13.0–17.0)
Lymphocytes Relative: 66 %
Lymphs Abs: 2.7 10*3/uL (ref 0.7–4.0)
MCH: 31.4 pg (ref 26.0–34.0)
MCHC: 30.9 g/dL (ref 30.0–36.0)
MCV: 101.4 fL — ABNORMAL HIGH (ref 80.0–100.0)
Metamyelocytes Relative: 0 %
Monocytes Absolute: 0.2 10*3/uL (ref 0.1–1.0)
Monocytes Relative: 4 %
Myelocytes: 0 %
Neutro Abs: 1.2 10*3/uL — ABNORMAL LOW (ref 1.7–7.7)
Neutrophils Relative %: 29 %
Other: 0 %
Platelets: 54 10*3/uL — ABNORMAL LOW (ref 150–400)
Promyelocytes Relative: 0 %
RBC: 2.2 MIL/uL — ABNORMAL LOW (ref 4.22–5.81)
RDW: 22.5 % — ABNORMAL HIGH (ref 11.5–15.5)
Smear Review: DECREASED
WBC Morphology: ABNORMAL
WBC: 4.1 10*3/uL (ref 4.0–10.5)
nRBC: 0 % (ref 0.0–0.2)
nRBC: 0 /100 WBC

## 2018-09-14 LAB — COMPREHENSIVE METABOLIC PANEL
ALT: 16 U/L (ref 0–44)
AST: 20 U/L (ref 15–41)
Albumin: 3.4 g/dL — ABNORMAL LOW (ref 3.5–5.0)
Alkaline Phosphatase: 82 U/L (ref 38–126)
Anion gap: 5 (ref 5–15)
BUN: 39 mg/dL — ABNORMAL HIGH (ref 8–23)
CO2: 27 mmol/L (ref 22–32)
Calcium: 9.3 mg/dL (ref 8.9–10.3)
Chloride: 111 mmol/L (ref 98–111)
Creatinine, Ser: 1.32 mg/dL — ABNORMAL HIGH (ref 0.61–1.24)
GFR calc Af Amer: 59 mL/min — ABNORMAL LOW (ref >60)
GFR calc non Af Amer: 51 mL/min — ABNORMAL LOW (ref >60)
Glucose, Bld: 116 mg/dL — ABNORMAL HIGH (ref 70–99)
Potassium: 4.3 mmol/L (ref 3.5–5.1)
Sodium: 143 mmol/L (ref 135–145)
Total Bilirubin: 0.8 mg/dL (ref 0.3–1.2)
Total Protein: 6.1 g/dL — ABNORMAL LOW (ref 6.5–8.1)

## 2018-09-14 MED ORDER — ACETAMINOPHEN 325 MG PO TABS
650.0000 mg | ORAL_TABLET | Freq: Once | ORAL | Status: AC
  Administered 2018-09-14: 650 mg via ORAL
  Filled 2018-09-14: qty 2

## 2018-09-14 MED ORDER — SODIUM CHLORIDE 0.9 % IV SOLN
Freq: Once | INTRAVENOUS | Status: AC
  Administered 2018-09-14: 10:00:00 via INTRAVENOUS
  Filled 2018-09-14: qty 250

## 2018-09-14 MED ORDER — DEXAMETHASONE SODIUM PHOSPHATE 10 MG/ML IJ SOLN
10.0000 mg | Freq: Once | INTRAMUSCULAR | Status: AC
  Administered 2018-09-14: 10 mg via INTRAVENOUS
  Filled 2018-09-14: qty 1

## 2018-09-14 MED ORDER — SODIUM CHLORIDE 0.9 % IV SOLN
70.0000 mg/m2 | Freq: Once | INTRAVENOUS | Status: AC
  Administered 2018-09-14: 125 mg via INTRAVENOUS
  Filled 2018-09-14: qty 5

## 2018-09-14 MED ORDER — SODIUM CHLORIDE 0.9 % IV SOLN
375.0000 mg/m2 | Freq: Once | INTRAVENOUS | Status: AC
  Administered 2018-09-14: 700 mg via INTRAVENOUS
  Filled 2018-09-14: qty 50

## 2018-09-14 MED ORDER — DIPHENHYDRAMINE HCL 25 MG PO CAPS
25.0000 mg | ORAL_CAPSULE | Freq: Once | ORAL | Status: AC
  Administered 2018-09-14: 25 mg via ORAL
  Filled 2018-09-14: qty 1

## 2018-09-14 MED ORDER — PALONOSETRON HCL INJECTION 0.25 MG/5ML
0.2500 mg | Freq: Once | INTRAVENOUS | Status: AC
  Administered 2018-09-14: 0.25 mg via INTRAVENOUS

## 2018-09-14 NOTE — Progress Notes (Signed)
ANC 1.3 per Lab

## 2018-09-14 NOTE — Progress Notes (Signed)
Hbg 6.9 today. Per Dr [name] okay to proceed with treatment

## 2018-09-14 NOTE — Progress Notes (Signed)
Patient denies any concerns today.  

## 2018-09-15 ENCOUNTER — Inpatient Hospital Stay: Payer: Medicare Other

## 2018-09-15 ENCOUNTER — Other Ambulatory Visit: Payer: Self-pay | Admitting: Oncology

## 2018-09-15 ENCOUNTER — Other Ambulatory Visit: Payer: Self-pay

## 2018-09-15 VITALS — BP 145/77 | HR 18 | Temp 97.0°F | Resp 16

## 2018-09-15 DIAGNOSIS — C9110 Chronic lymphocytic leukemia of B-cell type not having achieved remission: Secondary | ICD-10-CM

## 2018-09-15 DIAGNOSIS — D693 Immune thrombocytopenic purpura: Secondary | ICD-10-CM | POA: Diagnosis not present

## 2018-09-15 DIAGNOSIS — Z5112 Encounter for antineoplastic immunotherapy: Secondary | ICD-10-CM | POA: Diagnosis not present

## 2018-09-15 DIAGNOSIS — R110 Nausea: Secondary | ICD-10-CM | POA: Diagnosis not present

## 2018-09-15 DIAGNOSIS — R109 Unspecified abdominal pain: Secondary | ICD-10-CM | POA: Diagnosis not present

## 2018-09-15 DIAGNOSIS — Z5111 Encounter for antineoplastic chemotherapy: Secondary | ICD-10-CM | POA: Diagnosis not present

## 2018-09-15 LAB — PREPARE RBC (CROSSMATCH)

## 2018-09-15 MED ORDER — SODIUM CHLORIDE 0.9 % IV SOLN
Freq: Once | INTRAVENOUS | Status: AC
  Administered 2018-09-15: 10:00:00 via INTRAVENOUS
  Filled 2018-09-15: qty 250

## 2018-09-15 MED ORDER — ACETAMINOPHEN 325 MG PO TABS
650.0000 mg | ORAL_TABLET | Freq: Once | ORAL | Status: AC
  Administered 2018-09-15: 12:00:00 650 mg via ORAL
  Filled 2018-09-15: qty 2

## 2018-09-15 MED ORDER — SODIUM CHLORIDE 0.9% IV SOLUTION
250.0000 mL | Freq: Once | INTRAVENOUS | Status: AC
  Administered 2018-09-15: 250 mL via INTRAVENOUS
  Filled 2018-09-15: qty 250

## 2018-09-15 MED ORDER — HEPARIN SOD (PORK) LOCK FLUSH 100 UNIT/ML IV SOLN: 500.0000 [IU] | Freq: Once | INTRAVENOUS | Status: DC | PRN

## 2018-09-15 MED ORDER — SODIUM CHLORIDE 0.9 % IV SOLN
70.0000 mg/m2 | Freq: Once | INTRAVENOUS | Status: AC
  Administered 2018-09-15: 11:00:00 125 mg via INTRAVENOUS
  Filled 2018-09-15: qty 5

## 2018-09-15 MED ORDER — DIPHENHYDRAMINE HCL 50 MG/ML IJ SOLN
25.0000 mg | Freq: Once | INTRAMUSCULAR | Status: AC
  Administered 2018-09-15: 12:00:00 25 mg via INTRAVENOUS
  Filled 2018-09-15: qty 1

## 2018-09-15 MED ORDER — DEXAMETHASONE SODIUM PHOSPHATE 10 MG/ML IJ SOLN
10.0000 mg | Freq: Once | INTRAMUSCULAR | Status: AC
  Administered 2018-09-15: 10:00:00 10 mg via INTRAVENOUS
  Filled 2018-09-15: qty 1

## 2018-09-15 NOTE — Progress Notes (Signed)
Made Dr. [name] aware that patient expressed to me that this morning around 3 am he started to have chest pain. [name] said he took nitroglycerin three times, an aspirin and a blood pressure medication to get relief. No complaints of chest pain now. VSS. Dr. [name] told me to monitor patient closely with his second dose of Bendeka today and inform the patient that if he has chest pain again to go to the ED. Patient verbalizes understanding. Will continue to monitor.      1405: Patient tolerated Bendeka and blood transfusion without any complications. VSS.

## 2018-09-16 LAB — TYPE AND SCREEN
ABO/RH(D): A POS
Antibody Screen: NEGATIVE
Donor AG Type: NEGATIVE
Unit division: 0

## 2018-09-16 LAB — BPAM RBC
Blood Product Expiration Date: 202007202359
ISSUE DATE / TIME: 202007141207
Unit Type and Rh: 5100

## 2018-09-17 ENCOUNTER — Ambulatory Visit: Payer: Medicare Other

## 2018-09-17 LAB — SAMPLE TO BLOOD BANK

## 2018-09-21 ENCOUNTER — Inpatient Hospital Stay (HOSPITAL_BASED_OUTPATIENT_CLINIC_OR_DEPARTMENT_OTHER): Payer: Medicare Other | Admitting: Oncology

## 2018-09-21 ENCOUNTER — Encounter: Payer: Self-pay | Admitting: Oncology

## 2018-09-21 ENCOUNTER — Inpatient Hospital Stay: Payer: Medicare Other

## 2018-09-21 ENCOUNTER — Other Ambulatory Visit: Payer: Self-pay

## 2018-09-21 VITALS — BP 124/62 | HR 64 | Resp 18 | Wt 143.0 lb

## 2018-09-21 DIAGNOSIS — Z825 Family history of asthma and other chronic lower respiratory diseases: Secondary | ICD-10-CM

## 2018-09-21 DIAGNOSIS — D693 Immune thrombocytopenic purpura: Secondary | ICD-10-CM | POA: Diagnosis not present

## 2018-09-21 DIAGNOSIS — M1990 Unspecified osteoarthritis, unspecified site: Secondary | ICD-10-CM

## 2018-09-21 DIAGNOSIS — N183 Chronic kidney disease, stage 3 (moderate): Secondary | ICD-10-CM | POA: Diagnosis not present

## 2018-09-21 DIAGNOSIS — C9110 Chronic lymphocytic leukemia of B-cell type not having achieved remission: Secondary | ICD-10-CM

## 2018-09-21 DIAGNOSIS — R21 Rash and other nonspecific skin eruption: Secondary | ICD-10-CM

## 2018-09-21 DIAGNOSIS — K319 Disease of stomach and duodenum, unspecified: Secondary | ICD-10-CM

## 2018-09-21 DIAGNOSIS — E785 Hyperlipidemia, unspecified: Secondary | ICD-10-CM

## 2018-09-21 DIAGNOSIS — D699 Hemorrhagic condition, unspecified: Secondary | ICD-10-CM | POA: Diagnosis not present

## 2018-09-21 DIAGNOSIS — R531 Weakness: Secondary | ICD-10-CM

## 2018-09-21 DIAGNOSIS — R109 Unspecified abdominal pain: Secondary | ICD-10-CM

## 2018-09-21 DIAGNOSIS — K269 Duodenal ulcer, unspecified as acute or chronic, without hemorrhage or perforation: Secondary | ICD-10-CM

## 2018-09-21 DIAGNOSIS — Z8249 Family history of ischemic heart disease and other diseases of the circulatory system: Secondary | ICD-10-CM

## 2018-09-21 DIAGNOSIS — R5383 Other fatigue: Secondary | ICD-10-CM

## 2018-09-21 DIAGNOSIS — E1122 Type 2 diabetes mellitus with diabetic chronic kidney disease: Secondary | ICD-10-CM | POA: Diagnosis not present

## 2018-09-21 DIAGNOSIS — Z794 Long term (current) use of insulin: Secondary | ICD-10-CM

## 2018-09-21 DIAGNOSIS — D631 Anemia in chronic kidney disease: Secondary | ICD-10-CM

## 2018-09-21 DIAGNOSIS — R110 Nausea: Secondary | ICD-10-CM | POA: Diagnosis not present

## 2018-09-21 DIAGNOSIS — Z83438 Family history of other disorder of lipoprotein metabolism and other lipidemia: Secondary | ICD-10-CM

## 2018-09-21 DIAGNOSIS — Z8719 Personal history of other diseases of the digestive system: Secondary | ICD-10-CM

## 2018-09-21 DIAGNOSIS — Z833 Family history of diabetes mellitus: Secondary | ICD-10-CM

## 2018-09-21 DIAGNOSIS — I129 Hypertensive chronic kidney disease with stage 1 through stage 4 chronic kidney disease, or unspecified chronic kidney disease: Secondary | ICD-10-CM | POA: Diagnosis not present

## 2018-09-21 DIAGNOSIS — Z5112 Encounter for antineoplastic immunotherapy: Secondary | ICD-10-CM | POA: Diagnosis not present

## 2018-09-21 DIAGNOSIS — Z800 Family history of malignant neoplasm of digestive organs: Secondary | ICD-10-CM

## 2018-09-21 DIAGNOSIS — Z5111 Encounter for antineoplastic chemotherapy: Secondary | ICD-10-CM

## 2018-09-21 DIAGNOSIS — I2510 Atherosclerotic heart disease of native coronary artery without angina pectoris: Secondary | ICD-10-CM

## 2018-09-21 DIAGNOSIS — D649 Anemia, unspecified: Secondary | ICD-10-CM

## 2018-09-21 DIAGNOSIS — K5900 Constipation, unspecified: Secondary | ICD-10-CM

## 2018-09-21 DIAGNOSIS — Z79899 Other long term (current) drug therapy: Secondary | ICD-10-CM

## 2018-09-21 LAB — CBC WITH DIFFERENTIAL/PLATELET
Abs Immature Granulocytes: 0.03 10*3/uL (ref 0.00–0.07)
Basophils Absolute: 0 10*3/uL (ref 0.0–0.1)
Basophils Relative: 1 %
Eosinophils Absolute: 0 10*3/uL (ref 0.0–0.5)
Eosinophils Relative: 2 %
HCT: 20.4 % — ABNORMAL LOW (ref 39.0–52.0)
Hemoglobin: 6.5 g/dL — ABNORMAL LOW (ref 13.0–17.0)
Immature Granulocytes: 2 %
Lymphocytes Relative: 30 %
Lymphs Abs: 0.4 10*3/uL — ABNORMAL LOW (ref 0.7–4.0)
MCH: 31.9 pg (ref 26.0–34.0)
MCHC: 31.9 g/dL (ref 30.0–36.0)
MCV: 100 fL (ref 80.0–100.0)
Monocytes Absolute: 0.1 10*3/uL (ref 0.1–1.0)
Monocytes Relative: 7 %
Neutro Abs: 0.9 10*3/uL — ABNORMAL LOW (ref 1.7–7.7)
Neutrophils Relative %: 58 %
Platelets: 58 10*3/uL — ABNORMAL LOW (ref 150–400)
RBC: 2.04 MIL/uL — ABNORMAL LOW (ref 4.22–5.81)
RDW: 22.3 % — ABNORMAL HIGH (ref 11.5–15.5)
Smear Review: NORMAL
WBC: 1.5 10*3/uL — ABNORMAL LOW (ref 4.0–10.5)
nRBC: 0 % (ref 0.0–0.2)

## 2018-09-21 LAB — SAMPLE TO BLOOD BANK

## 2018-09-21 LAB — COMPREHENSIVE METABOLIC PANEL
ALT: 15 U/L (ref 0–44)
AST: 18 U/L (ref 15–41)
Albumin: 3.2 g/dL — ABNORMAL LOW (ref 3.5–5.0)
Alkaline Phosphatase: 77 U/L (ref 38–126)
Anion gap: 7 (ref 5–15)
BUN: 46 mg/dL — ABNORMAL HIGH (ref 8–23)
CO2: 28 mmol/L (ref 22–32)
Calcium: 8.9 mg/dL (ref 8.9–10.3)
Chloride: 106 mmol/L (ref 98–111)
Creatinine, Ser: 1.65 mg/dL — ABNORMAL HIGH (ref 0.61–1.24)
GFR calc Af Amer: 45 mL/min — ABNORMAL LOW (ref >60)
GFR calc non Af Amer: 39 mL/min — ABNORMAL LOW (ref >60)
Glucose, Bld: 181 mg/dL — ABNORMAL HIGH (ref 70–99)
Potassium: 4.8 mmol/L (ref 3.5–5.1)
Sodium: 141 mmol/L (ref 135–145)
Total Bilirubin: 1.2 mg/dL (ref 0.3–1.2)
Total Protein: 5.6 g/dL — ABNORMAL LOW (ref 6.5–8.1)

## 2018-09-21 LAB — PREPARE RBC (CROSSMATCH)

## 2018-09-21 MED ORDER — DOCUSATE SODIUM 100 MG PO CAPS: 100.0000 mg | ORAL_CAPSULE | Freq: Two times a day (BID) | ORAL | 0 refills | Status: DC

## 2018-09-21 MED ORDER — PANTOPRAZOLE SODIUM 40 MG PO TBEC: 40.0000 mg | DELAYED_RELEASE_TABLET | Freq: Every day | ORAL | 0 refills | Status: DC

## 2018-09-21 MED ORDER — SODIUM CHLORIDE 0.9% IV SOLUTION
250.0000 mL | Freq: Once | INTRAVENOUS | Status: AC
  Administered 2018-09-21: 250 mL via INTRAVENOUS
  Filled 2018-09-21: qty 250

## 2018-09-21 MED ORDER — DIPHENHYDRAMINE HCL 25 MG PO CAPS
25.0000 mg | ORAL_CAPSULE | Freq: Once | ORAL | Status: AC
  Administered 2018-09-21: 13:00:00 25 mg via ORAL
  Filled 2018-09-21: qty 1

## 2018-09-21 MED ORDER — ACETAMINOPHEN 325 MG PO TABS
650.0000 mg | ORAL_TABLET | Freq: Once | ORAL | Status: AC
  Administered 2018-09-21: 13:00:00 650 mg via ORAL
  Filled 2018-09-21: qty 2

## 2018-09-21 NOTE — Patient Instructions (Signed)
I am so sorry you are not feeling great today.  Today while in clinic you received 1 unit packed red blood cells for hemoglobin of 6.5.  I am sure this is contributing to your weakness and fatigue.  We spoke about some constipation you are having.  I would try taking over-the-counter Colace once a day to see if this helps make your stools little softer.  If you begin to have diarrhea please stop taking or take every other day.  I noticed your legs are slightly swollen.  I would recommend you keep your legs elevated and apply TED hose during the day to promote blood and fluid to shift back up toward your heart.  You may remove them at nighttime so that your legs can breathe.  Keep your feet elevated as high as possible when resting in your recliner and at bedtime.  This will also help with the swelling.  Take your fluid pill if you need to but also keep an eye on your blood pressure.  I have also added Protonix 40 mg daily to help with acid reflux and the nausea you may be having.  Continue your Zofran and Compazine as needed for nausea.  We talked about Compazine potentially being a little bit stronger and may help better than the Zofran.   If you have any additional questions please reach out to [place]. It was nice to meet you.  Faythe Casa, NP 09/21/2018 11:25 AM

## 2018-09-21 NOTE — Progress Notes (Signed)
Patient reports "my stomach was upset last week", denies nausea, vomiting and diarrhea. Patient denies other concerns today.

## 2018-09-21 NOTE — Progress Notes (Signed)
Scott [name]  [phone] [phone]  ID: Scott [name] OB: March 12, 1940  MR#: [id number]  [id number]#:[id number]  Patient Care Team: [name], MD as PCP - General (Family Medicine) Lloyd Huger, MD as Consulting Physician (Oncology) Clent Jacks, MD as Consulting Physician (Ophthalmology) Dorothy Spark, MD as Consulting Physician (Cardiology) Madelon Lips, MD as Consulting Physician (Nephrology)  CHIEF COMPLAINT: 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  INTERVAL HISTORY: Scott [name], 78 year old male presents to clinic for lab work and assessment.  He recently completed cycle 1 Treanda and Rituxan.  Appears to have tolerated well except for increased fatigue and weakness.  He feels "wore out".  His appetite remains stable.  He denies diarrhea but admits to "hard stools".  He currently does not take any stool softeners or laxatives.  Rash he previously mentioned has returned.  It does not appear to bother him.  Notes intermittent nausea and abdominal pain that is worse after eating.  He is taking Zofran and Compazine and states "it sometimes helps and sometimes does not".  He denies any recent fevers or illnesses.  He denies any dizziness or neurological complaints.  He denies any night sweats, lymphadenopathy, chest pain, shortness of breath, cough or hemoptysis.  He denies any vomiting or urinary complaints.  REVIEW OF SYSTEMS:   Review of Systems  Constitutional: Positive for malaise/fatigue. Negative for chills, fever and weight loss.  HENT: Negative for congestion, ear pain and tinnitus.   Eyes: Negative.  Negative for blurred vision and double vision.  Respiratory: Negative.  Negative for cough, sputum production and shortness of breath.   Cardiovascular: Negative.  Negative for chest pain, palpitations and leg swelling.  Gastrointestinal: Positive for abdominal pain, constipation (hard stools) and nausea. Negative for  diarrhea and vomiting.  Genitourinary: Negative for dysuria, frequency and urgency.  Musculoskeletal: Negative for back pain and falls.  Skin: Negative.  Negative for rash.  Neurological: Positive for weakness. Negative for headaches.  Endo/Heme/Allergies: Negative.  Does not bruise/bleed easily.  Psychiatric/Behavioral: Negative.  Negative for depression. The patient is not nervous/anxious and does not have insomnia.     As per HPI. Otherwise, a complete review of systems is negative.  PAST MEDICAL HISTORY: Past Medical History:  Diagnosis Date  . Acquired hand deformity 1962   hand saw accident at work  . Anemia 10/11/2011  . Arthritis   . Bradycardia 10/10/2011  . CAD (coronary artery disease) 10/10/2011   MI s/p PTCA (Dx-OM2 proximal concentric stenosis)  . CKD (chronic kidney disease) stage 3, GFR 30-59 ml/min (HCC)    Scott [name]  . CLL (chronic lymphocytic leukemia) ([place])   . Colon polyps   . Generalized headaches    frequent  . GI bleed 07/08/2017  . Glaucoma    s/p laser surgery  . HLD (hyperlipidemia)   . Hypertension   . Hypothyroidism 10/10/2011  . Thrombocytopenia ([place]) 10/11/2011  . Type 2 diabetes with nephropathy [facility])    DM refresher course [facility] (04/2013)    PAST SURGICAL HISTORY: Past Surgical History:  Procedure Laterality Date  . BACK SURGERY     cervical neck  . CATARACT EXTRACTION, BILATERAL Bilateral 2017  . COLONOSCOPY  11/2008   1 polyp, diverticulosis, rec rpt 5 yrs (Dr. Oletta Lamas, [name])  . COLONOSCOPY  06/2014   hyperplastic polyp, rpt 5 yrs ([facility])  . COLONOSCOPY WITH PROPOFOL N/A 11/17/2017   TA, HP, (Vanga, Tally Due, MD)  . ESOPHAGOGASTRODUODENOSCOPY (EGD) WITH PROPOFOL N/A 11/17/2017  healing erosive gastritis, intestinal metaplasia, neg H pylori (Vanga, Tally Due, MD)  . EYE SURGERY  2012   laser surgery for glaucoma  . PTCA  1994, 1995  . US ECHOCARDIOGRAPHY  10/2013   inferior wall hypokinesis, mild LVH, EF 50-55%, mild MR and LA dilation     FAMILY HISTORY: Family History  Problem Relation Age of Onset  . Diabetes Sister   . Stomach cancer Sister 21  . Pneumonia Father        caused death  . Other Brother        no communication with brother so unsure of any health conditions  . Coronary artery disease Son 11       5v CABG and stents  . Hyperlipidemia Sister   . Stroke Neg Hx   . Heart attack Neg Hx     ADVANCED DIRECTIVES (Y/N):  N  HEALTH MAINTENANCE: Social History   Tobacco Use  . Smoking status: Former Smoker    Quit date: 03/04/1978    Years since quitting: 40.5  . Smokeless tobacco: Never Used  Substance Use Topics  . Alcohol use: No  . Drug use: No     Colonoscopy:  PAP:  Bone density:  Lipid panel:  No Known Allergies  Current Outpatient Medications  Medication Sig Dispense Refill  . acetaminophen (TYLENOL) 650 MG CR tablet Take 650 mg by mouth every 8 (eight) hours as needed for pain.    Marland Kitchen atorvastatin (LIPITOR) 80 MG tablet Take 80 mg by mouth daily.    . carvedilol (COREG) 6.25 MG tablet TAKE 1 TABLET BY MOUTH TWICE DAILY (MAKE  ANNUAL  APPOINTMENT  FOR  FUTURE  REFILLS) 180 tablet 0  . glucose blood (ONE TOUCH ULTRA TEST) test strip 1 each by Other route 4 (four) times daily. and as needed to check sugars Dx code:  E11.21 400 each 3  . insulin aspart (NOVOLOG FLEXPEN) 100 UNIT/ML FlexPen Take 5 units with meals, take 10 units if mealtime sugar >250 15 mL 11  . Insulin Detemir (LEVEMIR FLEXTOUCH) 100 UNIT/ML Pen INJECT 25 UNITS SUBCUTANEOUSLY ONCE DAILY AT  10PM 15 pen 3  . levothyroxine (SYNTHROID) 88 MCG tablet Take 1 tablet (88 mcg total) by mouth daily. 90 tablet 1  . nitroGLYCERIN (NITROSTAT) 0.4 MG SL tablet Place 1 tablet (0.4 mg total) under the tongue every 5 (five) minutes as needed for chest pain ((MAX of 3 doses)). 25 tablet 0  . ondansetron (ZOFRAN) 8 MG tablet Take 1 tablet (8 mg total) by mouth 2 (two) times daily as needed for refractory nausea / vomiting. (Patient not  taking: Reported on 09/14/2018) 30 tablet 2  . prochlorperazine (COMPAZINE) 10 MG tablet Take 1 tablet (10 mg total) by mouth every 6 (six) hours as needed (Nausea or vomiting). (Patient not taking: Reported on 09/14/2018) 60 tablet 2  . traZODone (DESYREL) 100 MG tablet Take 1 tablet (100 mg total) by mouth at bedtime as needed for sleep. 90 tablet 1   No current facility-administered medications for this visit.     OBJECTIVE: There were no vitals filed for this visit.   There is no height or weight on file to calculate BMI.    ECOG FS:0 - Asymptomatic  Physical Exam Constitutional:      Appearance: Normal appearance.  HENT:     Head: Normocephalic and atraumatic.  Eyes:     Pupils: Pupils are equal, round, and reactive to light.  Neck:     Musculoskeletal:  Normal range of motion.  Cardiovascular:     Rate and Rhythm: Normal rate and regular rhythm.     Heart sounds: Normal heart sounds. No murmur.  Pulmonary:     Effort: Pulmonary effort is normal.     Breath sounds: Normal breath sounds. No wheezing.  Abdominal:     General: Bowel sounds are normal. There is no distension.     Palpations: Abdomen is soft.     Tenderness: There is no abdominal tenderness.  Musculoskeletal: Normal range of motion.  Skin:    General: Skin is warm and dry.     Coloration: Skin is pale.     Findings: No rash.  Neurological:     Mental Status: He is alert and oriented to person, place, and time.  Psychiatric:        Judgment: Judgment normal.    LAB RESULTS:  Lab Results  Component Value Date   NA 143 09/14/2018   K 4.3 09/14/2018   CL 111 09/14/2018   CO2 27 09/14/2018   GLUCOSE 116 (H) 09/14/2018   BUN 39 (H) 09/14/2018   CREATININE 1.32 (H) 09/14/2018   CALCIUM 9.3 09/14/2018   PROT 6.1 (L) 09/14/2018   ALBUMIN 3.4 (L) 09/14/2018   AST 20 09/14/2018   ALT 16 09/14/2018   ALKPHOS 82 09/14/2018   BILITOT 0.8 09/14/2018   GFRNONAA 51 (L) 09/14/2018   GFRAA 59 (L) 09/14/2018     Lab Results  Component Value Date   WBC 4.1 09/14/2018   NEUTROABS 1.2 (L) 09/14/2018   HGB 6.9 (L) 09/14/2018   HCT 22.3 (L) 09/14/2018   MCV 101.4 (H) 09/14/2018   PLT 54 (L) 09/14/2018     STUDIES: No results found.  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  PLAN:    1. CLL: Repeat bone marrow biopsy on Jul 28, 2018 reviewed independently with 73% involvement of CLL.  Previous bone marrow biopsy on October 23, 2017 revealed only 31% involvement.  Previously, peripheral blood FISH testing 50% incidence of mutation in the ATM gene which is commonly associated with deletion 11q. 11q- is associated with an unfavorable prognosis and high risk of not responding to initial treatment. 13q- is a more common mutation and is actually associated with a more favorable prognosis. Patient's white blood cell count continues to be within normal limits.  Patient has clear progression of disease in his bone marrow along with a worsening transfusion requirement.  Patient received 5 weekly cycles of Rituxan with mild improvement of his platelet count.  Recently had cycle 1 Rituxan and Treanda (09/14/2018-09/15/2018).  He returns today to assess tolerance and lab work. He will return to clinic on 10/05/2018 for consideration of cycle 2 Rituxan and Treanda.  Rituxan as above. 2.  Thrombocytopenia: Continues to be decreased but is stable.  58,000 today.  Continue to monitor. 3.  Anemia: Decline in hemoglobin.  Today is 6.5 (6.9).  He will require 1 unit packed red blood cells today. 4.  Bilateral lower extremity rash: Appears to have returned.  Dr. [name] previously mentioned possible topical antifungal cream.  Have offered that to the patient but he states he will wait given it is not bothersome.  We will continue to monitor. 5.  Bilateral lower extremity swelling: Nonpitting.  Recommend TED hose during the day, elevation of legs while resting and at bedtime.  Patient to pick up TED hose at  pharmacy.  States he has a "fluid pill".  I  do not see this on his med list.  6.  GERD-like symptoms/nausea without vomiting: Continue Compazine and Zofran as prescribed.  I have added Protonix 40 mg daily to see if this helps.  He states his pain is postprandial.  Could be developing ulcer or related to current treatment regime.  7.  Hard stools/constipation: Recommend Colace daily OTC.  Not currently on narcotics. 8.  Weakness and fatigue: Multifactorial; disease, anemia and chemotherapy.  Transfuse packed red blood cells as above.  Patient expressed understanding and was in agreement with this plan. He also understands that He can call clinic at any time with any questions, concerns, or complaints.    Jacquelin Hawking, NP   09/21/2018 8:56 AM

## 2018-09-22 ENCOUNTER — Ambulatory Visit: Payer: Medicare Other

## 2018-09-22 LAB — BPAM RBC
Blood Product Expiration Date: 202008062359
ISSUE DATE / TIME: 202007201336
Unit Type and Rh: 5100

## 2018-09-22 LAB — TYPE AND SCREEN
ABO/RH(D): A POS
Antibody Screen: NEGATIVE
Donor AG Type: NEGATIVE
Unit division: 0

## 2018-10-02 ENCOUNTER — Other Ambulatory Visit: Payer: Self-pay

## 2018-10-04 NOTE — Progress Notes (Signed)
Scott [name]  Telephone:(336) [phone] Fax:(336) [phone]  ID: Scott [name] OB: 1941-01-02  MR#: [id number]  [id number]#:[id number]  Patient Care Team: [name], MD as PCP - General (Family Medicine) Lloyd Huger, MD as Consulting Physician (Oncology) Clent Jacks, MD as Consulting Physician (Ophthalmology) Dorothy Spark, MD as Consulting Physician (Cardiology) Madelon Lips, MD as Consulting Physician (Nephrology)  CHIEF COMPLAINT: 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  INTERVAL HISTORY: Patient returns to clinic today for further evaluation and consideration of cycle 2, day 1 of Treanda and Rituxan.  He tolerated his first treatment well without significant side effects.  He continues to have chronic weakness and fatigue, but states this has mildly improved over the past several weeks.  He denies any recent fevers or illnesses.  He denies any dizziness or other neurologic complaints. He has a good appetite and denies weight loss. He denies any night sweats. He has noted no lymphadenopathy.  He has no chest pain, shortness of breath, cough, or hemoptysis.  He denies any nausea, vomiting, constipation, or diarrhea. He has no urinary complaints.  Patient offers no specific complaints today.  REVIEW OF SYSTEMS:   Review of Systems  Constitutional: Positive for malaise/fatigue. Negative for fever and weight loss.  HENT: Negative.  Negative for nosebleeds.   Respiratory: Negative.  Negative for cough and shortness of breath.   Cardiovascular: Negative.  Negative for chest pain and leg swelling.  Gastrointestinal: Negative.  Negative for abdominal pain, blood in stool and melena.  Genitourinary: Negative.  Negative for dysuria and hematuria.  Musculoskeletal: Negative.  Negative for back pain and neck pain.  Skin: Negative.  Negative for itching and rash.  Neurological: Positive for weakness. Negative for dizziness, sensory change, focal weakness and  headaches.  Endo/Heme/Allergies: Bruises/bleeds easily.  Psychiatric/Behavioral: Negative.  The patient is not nervous/anxious.     As per HPI. Otherwise, a complete review of systems is negative.  PAST MEDICAL HISTORY: Past Medical History:  Diagnosis Date  . Acquired hand deformity 1962   hand saw accident at work  . Anemia 10/11/2011  . Arthritis   . Bradycardia 10/10/2011  . CAD (coronary artery disease) 10/10/2011   MI s/p PTCA (Dx-OM2 proximal concentric stenosis)  . CKD (chronic kidney disease) stage 3, GFR 30-59 ml/min (HCC)    [name]  . CLL (chronic lymphocytic leukemia) (Scott [name])   . Colon polyps   . Generalized headaches    frequent  . GI bleed 07/08/2017  . Glaucoma    s/p laser surgery  . HLD (hyperlipidemia)   . Hypertension   . Hypothyroidism 10/10/2011  . Thrombocytopenia ([name]) 10/11/2011  . Type 2 diabetes with nephropathy Scott [name])    DM refresher course Scott [name] (04/2013)    PAST SURGICAL HISTORY: Past Surgical History:  Procedure Laterality Date  . BACK SURGERY     cervical neck  . CATARACT EXTRACTION, BILATERAL Bilateral 2017  . COLONOSCOPY  11/2008   1 polyp, diverticulosis, rec rpt 5 yrs (Dr. Oletta Lamas, [name])  . COLONOSCOPY  06/2014   hyperplastic polyp, rpt 5 yrs ([place])  . COLONOSCOPY WITH PROPOFOL N/A 11/17/2017   TA, HP, (Vanga, Tally Due, MD)  . ESOPHAGOGASTRODUODENOSCOPY (EGD) WITH PROPOFOL N/A 11/17/2017   healing erosive gastritis, intestinal metaplasia, neg H pylori (Vanga, Tally Due, MD)  . EYE SURGERY  2012   laser surgery for glaucoma  . PTCA  1994, 1995  . US ECHOCARDIOGRAPHY  10/2013   inferior wall hypokinesis, mild LVH, EF 50-55%, mild  MR and LA dilation    FAMILY HISTORY: Family History  Problem Relation Age of Onset  . Diabetes Sister   . Stomach cancer Sister 55  . Pneumonia Father        caused death  . Other Brother        no communication with brother so unsure of any health conditions  . Coronary artery disease Son 6       5v  CABG and stents  . Hyperlipidemia Sister   . Stroke Neg Hx   . Heart attack Neg Hx     ADVANCED DIRECTIVES (Y/N):  N  HEALTH MAINTENANCE: Social History   Tobacco Use  . Smoking status: Former Smoker    Quit date: 03/04/1978    Years since quitting: 40.6  . Smokeless tobacco: Never Used  Substance Use Topics  . Alcohol use: No  . Drug use: No     Colonoscopy:  PAP:  Bone density:  Lipid panel:  No Known Allergies  Current Outpatient Medications  Medication Sig Dispense Refill  . acetaminophen (TYLENOL) 650 MG CR tablet Take 650 mg by mouth every 8 (eight) hours as needed for pain.    Marland Kitchen atorvastatin (LIPITOR) 80 MG tablet Take 80 mg by mouth daily.    . carvedilol (COREG) 6.25 MG tablet TAKE 1 TABLET BY MOUTH TWICE DAILY (MAKE  ANNUAL  APPOINTMENT  FOR  FUTURE  REFILLS) 180 tablet 0  . docusate sodium (COLACE) 100 MG capsule Take 1 capsule (100 mg total) by mouth 2 (two) times daily. 10 capsule 0  . glucose blood (ONE TOUCH ULTRA TEST) test strip 1 each by Other route 4 (four) times daily. and as needed to check sugars Dx code:  E11.21 400 each 3  . insulin aspart (NOVOLOG FLEXPEN) 100 UNIT/ML FlexPen Take 5 units with meals, take 10 units if mealtime sugar >250 15 mL 11  . Insulin Detemir (LEVEMIR FLEXTOUCH) 100 UNIT/ML Pen INJECT 25 UNITS SUBCUTANEOUSLY ONCE DAILY AT  10PM 15 pen 3  . levothyroxine (SYNTHROID) 88 MCG tablet Take 1 tablet (88 mcg total) by mouth daily. 90 tablet 1  . nitroGLYCERIN (NITROSTAT) 0.4 MG SL tablet Place 1 tablet (0.4 mg total) under the tongue every 5 (five) minutes as needed for chest pain ((MAX of 3 doses)). 25 tablet 0  . ondansetron (ZOFRAN) 8 MG tablet Take 1 tablet (8 mg total) by mouth 2 (two) times daily as needed for refractory nausea / vomiting. 30 tablet 2  . pantoprazole (PROTONIX) 40 MG tablet Take 1 tablet (40 mg total) by mouth daily. 90 tablet 0  . prochlorperazine (COMPAZINE) 10 MG tablet Take 1 tablet (10 mg total) by mouth every 6  (six) hours as needed (Nausea or vomiting). 60 tablet 2  . traZODone (DESYREL) 100 MG tablet Take 1 tablet (100 mg total) by mouth at bedtime as needed for sleep. 90 tablet 1   No current facility-administered medications for this visit.     OBJECTIVE: Vitals:   10/05/18 0905  BP: 122/72  Pulse: 78  Resp: 18  Temp: (!) 97.2 F (36.2 C)     Body mass index is 20.73 kg/m.    ECOG FS:0 - Asymptomatic  General: Well-developed, well-nourished, no acute distress. Eyes: Pink conjunctiva, anicteric sclera. HEENT: Normocephalic, moist mucous membranes. Lungs: Clear to auscultation bilaterally. Heart: Regular rate and rhythm. No rubs, murmurs, or gallops. Abdomen: Soft, nontender, nondistended. No organomegaly noted, normoactive bowel sounds. Musculoskeletal: No edema, cyanosis, or clubbing. Neuro: Alert,  answering all questions appropriately. Cranial nerves grossly intact. Skin: No rashes or petechiae noted. Psych: Normal affect.  LAB RESULTS:  Lab Results  Component Value Date   NA 140 10/05/2018   K 4.3 10/05/2018   CL 102 10/05/2018   CO2 30 10/05/2018   GLUCOSE 228 (H) 10/05/2018   BUN 42 (H) 10/05/2018   CREATININE 1.56 (H) 10/05/2018   CALCIUM 8.9 10/05/2018   PROT 5.9 (L) 10/05/2018   ALBUMIN 3.5 10/05/2018   AST 19 10/05/2018   ALT 12 10/05/2018   ALKPHOS 100 10/05/2018   BILITOT 1.1 10/05/2018   GFRNONAA 42 (L) 10/05/2018   GFRAA 49 (L) 10/05/2018    Lab Results  Component Value Date   WBC 0.5 (LL) 10/05/2018   NEUTROABS 0.2 (L) 10/05/2018   HGB 7.0 (L) 10/05/2018   HCT 22.0 (L) 10/05/2018   MCV 98.2 10/05/2018   PLT 47 (L) 10/05/2018     STUDIES: No results found.  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  PLAN:    1. CLL: Repeat bone marrow biopsy on Jul 28, 2018 reviewed independently with 73% involvement of CLL.  Previous bone marrow biopsy on October 23, 2017 revealed only 31% involvement.  Previously, peripheral blood  FISH testing 50% incidence of mutation in the ATM gene which is commonly associated with deletion 11q. 11q- is associated with an unfavorable prognosis and high risk of not responding to initial treatment. 13q- is a more common mutation and is actually associated with a more favorable prognosis. Patient's white blood cell count continues to be within normal limits.  Patient has clear progression of disease in his bone marrow along with a worsening transfusion requirement.  Patient received 5 weekly cycles of Rituxan with mild improvement of his platelet count.  Patient received cycle 1 of Rituxan and Treanda 3 weeks ago and tolerated it relatively well.  Delay cycle 2, day 1 secondary to neutropenia.  Return to clinic in 1 week for further evaluation and reconsideration of cycle 2, day 1 of treatment.   2.  Thrombocytopenia: Chronic and relatively unchanged.  Patient has received high-dose prednisone, IVIG, and Rituxan with no appreciable durability to improve his platelet count.  Delay Treanda and Rituxan as above.  Expect platelet count to possibly get worse before improving. 3.  Anemia: Patient's hemoglobin is 7.0 today, therefore he will receive 1 unit of packed red blood cells rather than treatment as above.  He previously reported black tarry stools, but none recently.  Patient had colonoscopy on November 17, 2017 that removed 2 nonmalignant polyps.  EGD on the same day revealed nonbleeding erosive gastropathy with multiple nonbleeding duodenal ulcers.  Consider referral back to GI after COVID-19 and chemotherapy is complete.  4.  Reaction to Rituxan: Rate-based.  Patient will require premedications for any further infusions with Rituxan.  These have been entered into his treatment plan. 5.  Easy bruising: Secondary to thrombocytopenia.  Monitor. 6.  Nosebleeds: Patient does not complain of this this week.  Patient was given a referral back to ENT for further evaluation. 7.  Rash: Resolved.  Patient  expressed understanding and was in agreement with this plan. He also understands that He can call clinic at any time with any questions, concerns, or complaints.    Lloyd Huger, MD   10/05/2018 12:53 PM

## 2018-10-05 ENCOUNTER — Inpatient Hospital Stay (HOSPITAL_BASED_OUTPATIENT_CLINIC_OR_DEPARTMENT_OTHER): Payer: Medicare Other | Admitting: Oncology

## 2018-10-05 ENCOUNTER — Inpatient Hospital Stay: Payer: Medicare Other | Attending: Oncology

## 2018-10-05 ENCOUNTER — Other Ambulatory Visit: Payer: Self-pay

## 2018-10-05 ENCOUNTER — Inpatient Hospital Stay: Payer: Medicare Other

## 2018-10-05 ENCOUNTER — Encounter: Payer: Self-pay | Admitting: Oncology

## 2018-10-05 VITALS — BP 122/72 | HR 78 | Temp 97.2°F | Resp 18 | Wt 148.6 lb

## 2018-10-05 DIAGNOSIS — D649 Anemia, unspecified: Secondary | ICD-10-CM | POA: Insufficient documentation

## 2018-10-05 DIAGNOSIS — I6523 Occlusion and stenosis of bilateral carotid arteries: Secondary | ICD-10-CM

## 2018-10-05 DIAGNOSIS — C9110 Chronic lymphocytic leukemia of B-cell type not having achieved remission: Secondary | ICD-10-CM | POA: Diagnosis not present

## 2018-10-05 DIAGNOSIS — I2510 Atherosclerotic heart disease of native coronary artery without angina pectoris: Secondary | ICD-10-CM | POA: Insufficient documentation

## 2018-10-05 DIAGNOSIS — I129 Hypertensive chronic kidney disease with stage 1 through stage 4 chronic kidney disease, or unspecified chronic kidney disease: Secondary | ICD-10-CM | POA: Diagnosis not present

## 2018-10-05 DIAGNOSIS — Z5111 Encounter for antineoplastic chemotherapy: Secondary | ICD-10-CM | POA: Insufficient documentation

## 2018-10-05 DIAGNOSIS — Z5112 Encounter for antineoplastic immunotherapy: Secondary | ICD-10-CM | POA: Insufficient documentation

## 2018-10-05 DIAGNOSIS — R5383 Other fatigue: Secondary | ICD-10-CM | POA: Insufficient documentation

## 2018-10-05 DIAGNOSIS — E1122 Type 2 diabetes mellitus with diabetic chronic kidney disease: Secondary | ICD-10-CM | POA: Insufficient documentation

## 2018-10-05 DIAGNOSIS — N183 Chronic kidney disease, stage 3 (moderate): Secondary | ICD-10-CM | POA: Insufficient documentation

## 2018-10-05 DIAGNOSIS — R531 Weakness: Secondary | ICD-10-CM | POA: Insufficient documentation

## 2018-10-05 DIAGNOSIS — Z794 Long term (current) use of insulin: Secondary | ICD-10-CM | POA: Diagnosis not present

## 2018-10-05 DIAGNOSIS — Z5189 Encounter for other specified aftercare: Secondary | ICD-10-CM | POA: Diagnosis not present

## 2018-10-05 DIAGNOSIS — Z79899 Other long term (current) drug therapy: Secondary | ICD-10-CM | POA: Insufficient documentation

## 2018-10-05 DIAGNOSIS — E785 Hyperlipidemia, unspecified: Secondary | ICD-10-CM | POA: Insufficient documentation

## 2018-10-05 DIAGNOSIS — D693 Immune thrombocytopenic purpura: Secondary | ICD-10-CM | POA: Diagnosis not present

## 2018-10-05 DIAGNOSIS — Z87891 Personal history of nicotine dependence: Secondary | ICD-10-CM | POA: Diagnosis not present

## 2018-10-05 LAB — CBC WITH DIFFERENTIAL/PLATELET
Abs Immature Granulocytes: 0 10*3/uL (ref 0.00–0.07)
Basophils Absolute: 0 10*3/uL (ref 0.0–0.1)
Basophils Relative: 0 %
Eosinophils Absolute: 0 10*3/uL (ref 0.0–0.5)
Eosinophils Relative: 6 %
HCT: 22 % — ABNORMAL LOW (ref 39.0–52.0)
Hemoglobin: 7 g/dL — ABNORMAL LOW (ref 13.0–17.0)
Immature Granulocytes: 0 %
Lymphocytes Relative: 35 %
Lymphs Abs: 0.2 10*3/uL — ABNORMAL LOW (ref 0.7–4.0)
MCH: 31.3 pg (ref 26.0–34.0)
MCHC: 31.8 g/dL (ref 30.0–36.0)
MCV: 98.2 fL (ref 80.0–100.0)
Monocytes Absolute: 0.1 10*3/uL (ref 0.1–1.0)
Monocytes Relative: 19 %
Neutro Abs: 0.2 10*3/uL — ABNORMAL LOW (ref 1.7–7.7)
Neutrophils Relative %: 40 %
Platelets: 47 10*3/uL — ABNORMAL LOW (ref 150–400)
RBC: 2.24 MIL/uL — ABNORMAL LOW (ref 4.22–5.81)
RDW: 21.3 % — ABNORMAL HIGH (ref 11.5–15.5)
Smear Review: NORMAL
WBC: 0.5 10*3/uL — CL (ref 4.0–10.5)
nRBC: 0 % (ref 0.0–0.2)

## 2018-10-05 LAB — COMPREHENSIVE METABOLIC PANEL
ALT: 12 U/L (ref 0–44)
AST: 19 U/L (ref 15–41)
Albumin: 3.5 g/dL (ref 3.5–5.0)
Alkaline Phosphatase: 100 U/L (ref 38–126)
Anion gap: 8 (ref 5–15)
BUN: 42 mg/dL — ABNORMAL HIGH (ref 8–23)
CO2: 30 mmol/L (ref 22–32)
Calcium: 8.9 mg/dL (ref 8.9–10.3)
Chloride: 102 mmol/L (ref 98–111)
Creatinine, Ser: 1.56 mg/dL — ABNORMAL HIGH (ref 0.61–1.24)
GFR calc Af Amer: 49 mL/min — ABNORMAL LOW (ref >60)
GFR calc non Af Amer: 42 mL/min — ABNORMAL LOW (ref >60)
Glucose, Bld: 228 mg/dL — ABNORMAL HIGH (ref 70–99)
Potassium: 4.3 mmol/L (ref 3.5–5.1)
Sodium: 140 mmol/L (ref 135–145)
Total Bilirubin: 1.1 mg/dL (ref 0.3–1.2)
Total Protein: 5.9 g/dL — ABNORMAL LOW (ref 6.5–8.1)

## 2018-10-05 LAB — SAMPLE TO BLOOD BANK

## 2018-10-05 LAB — PREPARE RBC (CROSSMATCH)

## 2018-10-05 MED ORDER — DIPHENHYDRAMINE HCL 50 MG/ML IJ SOLN
25.0000 mg | Freq: Once | INTRAMUSCULAR | Status: AC
  Administered 2018-10-05: 25 mg via INTRAVENOUS
  Filled 2018-10-05: qty 1

## 2018-10-05 MED ORDER — ACETAMINOPHEN 325 MG PO TABS
650.0000 mg | ORAL_TABLET | Freq: Once | ORAL | Status: AC
  Administered 2018-10-05: 650 mg via ORAL
  Filled 2018-10-05: qty 2

## 2018-10-05 MED ORDER — SODIUM CHLORIDE 0.9% IV SOLUTION
250.0000 mL | Freq: Once | INTRAVENOUS | Status: AC
  Administered 2018-10-05: 250 mL via INTRAVENOUS
  Filled 2018-10-05: qty 250

## 2018-10-05 NOTE — Progress Notes (Signed)
1020: Pt to receive one unit PRBCs and no treatment today per Erline Levine RN per Dr. [name].

## 2018-10-05 NOTE — Progress Notes (Signed)
Pt here for follow up and treatment. Offers no complaints. States is feeling well.

## 2018-10-06 ENCOUNTER — Inpatient Hospital Stay: Payer: Medicare Other

## 2018-10-07 LAB — TYPE AND SCREEN
ABO/RH(D): A POS
Antibody Screen: NEGATIVE
Donor AG Type: NEGATIVE
Unit division: 0

## 2018-10-07 LAB — BPAM RBC
Blood Product Expiration Date: 202008092359
ISSUE DATE / TIME: 202008031202
Unit Type and Rh: 600

## 2018-10-10 ENCOUNTER — Other Ambulatory Visit: Payer: Self-pay | Admitting: Oncology

## 2018-10-10 NOTE — Progress Notes (Signed)
[place]  Telephone:(336) [phone] Fax:(336) [phone]  ID: [name] OB: [birth year]  MR#: [id number]  FYB#:[id number]  Patient Care Team: [name], MD as PCP - General (Family Medicine) Lloyd Huger, MD as Consulting Physician (Oncology) Clent Jacks, MD as Consulting Physician (Ophthalmology) Dorothy Spark, MD as Consulting Physician (Cardiology) Madelon Lips, MD as Consulting Physician (Nephrology)  CHIEF COMPLAINT: 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  INTERVAL HISTORY: Patient returns to clinic today for further evaluation and reconsideration of cycle 2, day 1 of Treanda and Rituxan.  He continues to have chronic weakness and fatigue, but otherwise feels well.  He denies any recent fevers or illnesses.  He denies any dizziness or other neurologic complaints. He has a good appetite and denies weight loss. He denies any night sweats. He has noted no lymphadenopathy.  He has no chest pain, shortness of breath, cough, or hemoptysis.  He denies any nausea, vomiting, constipation, or diarrhea. He has no urinary complaints.  Patient offers no further specific complaints today.  REVIEW OF SYSTEMS:   Review of Systems  Constitutional: Positive for malaise/fatigue. Negative for fever and weight loss.  HENT: Negative.  Negative for nosebleeds.   Respiratory: Negative.  Negative for cough and shortness of breath.   Cardiovascular: Negative.  Negative for chest pain and leg swelling.  Gastrointestinal: Negative.  Negative for abdominal pain, blood in stool and melena.  Genitourinary: Negative.  Negative for dysuria and hematuria.  Musculoskeletal: Negative.  Negative for back pain and neck pain.  Skin: Negative.  Negative for itching and rash.  Neurological: Positive for weakness. Negative for dizziness, sensory change, focal weakness and headaches.  Endo/Heme/Allergies: Bruises/bleeds easily.  Psychiatric/Behavioral: Negative.  The  patient is not nervous/anxious.     As per HPI. Otherwise, a complete review of systems is negative.  PAST MEDICAL HISTORY: Past Medical History:  Diagnosis Date  . Acquired hand deformity 1962   hand saw accident at work  . Anemia 10/11/2011  . Arthritis   . Bradycardia 10/10/2011  . CAD (coronary artery disease) 10/10/2011   MI s/p PTCA (Dx-OM2 proximal concentric stenosis)  . CKD (chronic kidney disease) stage 3, GFR 30-59 ml/min (HCC)    [name]  . CLL (chronic lymphocytic leukemia) ([place])   . Colon polyps   . Generalized headaches    frequent  . GI bleed 07/08/2017  . Glaucoma    s/p laser surgery  . HLD (hyperlipidemia)   . Hypertension   . Hypothyroidism 10/10/2011  . Thrombocytopenia ([place]) 10/11/2011  . Type 2 diabetes with nephropathy [facility])    DM refresher course [facility] (04/2013)    PAST SURGICAL HISTORY: Past Surgical History:  Procedure Laterality Date  . BACK SURGERY     cervical neck  . CATARACT EXTRACTION, BILATERAL Bilateral 2017  . COLONOSCOPY  11/2008   1 polyp, diverticulosis, rec rpt 5 yrs (Dr. Oletta Lamas, [name])  . COLONOSCOPY  06/2014   hyperplastic polyp, rpt 5 yrs ([facility])  . COLONOSCOPY WITH PROPOFOL N/A 11/17/2017   TA, HP, (Vanga, Tally Due, MD)  . ESOPHAGOGASTRODUODENOSCOPY (EGD) WITH PROPOFOL N/A 11/17/2017   healing erosive gastritis, intestinal metaplasia, neg H pylori (Vanga, Tally Due, MD)  . EYE SURGERY  2012   laser surgery for glaucoma  . PTCA  1994, 1995  . US ECHOCARDIOGRAPHY  10/2013   inferior wall hypokinesis, mild LVH, EF 50-55%, mild MR and LA dilation    FAMILY HISTORY: Family History  Problem Relation Age of Onset  .  Diabetes Sister   . Stomach cancer Sister 36  . Pneumonia Father        caused death  . Other Brother        no communication with brother so unsure of any health conditions  . Coronary artery disease Son 10       5v CABG and stents  . Hyperlipidemia Sister   . Stroke Neg Hx   . Heart attack Neg Hx      ADVANCED DIRECTIVES (Y/N):  N  HEALTH MAINTENANCE: Social History   Tobacco Use  . Smoking status: Former Smoker    Quit date: 03/04/1978    Years since quitting: 40.6  . Smokeless tobacco: Never Used  Substance Use Topics  . Alcohol use: No  . Drug use: No     Colonoscopy:  PAP:  Bone density:  Lipid panel:  No Known Allergies  Current Outpatient Medications  Medication Sig Dispense Refill  . acetaminophen (TYLENOL) 650 MG CR tablet Take 650 mg by mouth every 8 (eight) hours as needed for pain.    Marland Kitchen atorvastatin (LIPITOR) 80 MG tablet Take 80 mg by mouth daily.    . carvedilol (COREG) 6.25 MG tablet TAKE 1 TABLET BY MOUTH TWICE DAILY (MAKE  ANNUAL  APPOINTMENT  FOR  FUTURE  REFILLS) 180 tablet 0  . docusate sodium (COLACE) 100 MG capsule Take 1 capsule (100 mg total) by mouth 2 (two) times daily. 10 capsule 0  . glucose blood (ONE TOUCH ULTRA TEST) test strip 1 each by Other route 4 (four) times daily. and as needed to check sugars Dx code:  E11.21 400 each 3  . insulin aspart (NOVOLOG FLEXPEN) 100 UNIT/ML FlexPen Take 5 units with meals, take 10 units if mealtime sugar >250 15 mL 11  . Insulin Detemir (LEVEMIR FLEXTOUCH) 100 UNIT/ML Pen INJECT 25 UNITS SUBCUTANEOUSLY ONCE DAILY AT  10PM 15 pen 3  . levothyroxine (SYNTHROID) 88 MCG tablet Take 1 tablet (88 mcg total) by mouth daily. 90 tablet 1  . nitroGLYCERIN (NITROSTAT) 0.4 MG SL tablet Place 1 tablet (0.4 mg total) under the tongue every 5 (five) minutes as needed for chest pain ((MAX of 3 doses)). 25 tablet 0  . ondansetron (ZOFRAN) 8 MG tablet Take 1 tablet (8 mg total) by mouth 2 (two) times daily as needed for refractory nausea / vomiting. 30 tablet 2  . pantoprazole (PROTONIX) 40 MG tablet Take 1 tablet (40 mg total) by mouth daily. 90 tablet 0  . prochlorperazine (COMPAZINE) 10 MG tablet Take 1 tablet (10 mg total) by mouth every 6 (six) hours as needed (Nausea or vomiting). 60 tablet 2  . traZODone (DESYREL) 100 MG  tablet Take 1 tablet (100 mg total) by mouth at bedtime as needed for sleep. 90 tablet 1   No current facility-administered medications for this visit.     OBJECTIVE: Vitals:   10/12/18 0855  BP: 138/63  Pulse: 63  Resp: 20     Body mass index is 20.98 kg/m.    ECOG FS:0 - Asymptomatic  General: Well-developed, well-nourished, no acute distress. Eyes: Pink conjunctiva, anicteric sclera. HEENT: Normocephalic, moist mucous membranes. Lungs: Clear to auscultation bilaterally. Heart: Regular rate and rhythm. No rubs, murmurs, or gallops. Abdomen: Soft, nontender, nondistended. No organomegaly noted, normoactive bowel sounds. Musculoskeletal: No edema, cyanosis, or clubbing. Neuro: Alert, answering all questions appropriately. Cranial nerves grossly intact. Skin: No rashes or petechiae noted. Psych: Normal affect.  LAB RESULTS:  Lab Results  Component Value  Date   NA 143 10/12/2018   K 4.5 10/12/2018   CL 105 10/12/2018   CO2 29 10/12/2018   GLUCOSE 178 (H) 10/12/2018   BUN 40 (H) 10/12/2018   CREATININE 1.39 (H) 10/12/2018   CALCIUM 9.0 10/12/2018   PROT 5.9 (L) 10/12/2018   ALBUMIN 3.5 10/12/2018   AST 18 10/12/2018   ALT 15 10/12/2018   ALKPHOS 114 10/12/2018   BILITOT 1.0 10/12/2018   GFRNONAA 48 (L) 10/12/2018   GFRAA 56 (L) 10/12/2018    Lab Results  Component Value Date   WBC 0.6 (LL) 10/12/2018   NEUTROABS 0.3 (L) 10/12/2018   HGB 7.6 (L) 10/12/2018   HCT 23.7 (L) 10/12/2018   MCV 97.5 10/12/2018   PLT 40 (L) 10/12/2018     STUDIES: No results found.  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  PLAN:    1. CLL: Repeat bone marrow biopsy on Jul 28, 2018 reviewed independently with 73% involvement of CLL.  Previous bone marrow biopsy on October 23, 2017 revealed only 31% involvement.  Previously, peripheral blood FISH testing 50% incidence of mutation in the ATM gene which is commonly associated with deletion 11q. 11q- is  associated with an unfavorable prognosis and high risk of not responding to initial treatment. 13q- is a more common mutation and is actually associated with a more favorable prognosis. Patient has clear progression of disease in his bone marrow along with a worsening transfusion requirement.  He received 5 weekly cycles of Rituxan with mild improvement of his platelet count.  Patient received cycle 1 of Rituxan and Treanda 4 weeks ago and tolerated it relatively well, although he continues to have persistent neutropenia.  We will once again delay cycle 2, day 1 and patient will receive Udenyca instead.  Return to clinic in 2 weeks for further evaluation and reconsider of treatment. 2.  Thrombocytopenia: Chronic and relatively unchanged.  Patient has received high-dose prednisone, IVIG, and Rituxan with no appreciable durability to improve his platelet count.  Delay Treanda and Rituxan as above.  Expect platelet count to possibly get worse before improving. 3.  Anemia: Hemoglobin is mildly improved to 7.6 today.  He received 1 unit of packed red blood cells last week.  He previously reported black tarry stools, but none recently.  Patient had colonoscopy on November 17, 2017 that removed 2 nonmalignant polyps.  EGD on the same day revealed nonbleeding erosive gastropathy with multiple nonbleeding duodenal ulcers.  Consider referral back to GI after COVID-19 and chemotherapy is complete.  4.  Reaction to Rituxan: Rate-based.  Patient will require premedications for any further infusions with Rituxan.  These have been entered into his treatment plan. 5.  Easy bruising: Secondary to thrombocytopenia.  Monitor. 6.  Nosebleeds: Patient does not complain of this this week.  Patient was given a referral back to ENT for further evaluation. 7.  Rash: Resolved. 8.  Neutropenia: Udenyca as above.  Will add on Pro Neulasta for day 2 of each cycle starting with cycle 2.  Patient expressed understanding and was in  agreement with this plan. He also understands that He can call clinic at any time with any questions, concerns, or complaints.    Lloyd Huger, MD   10/12/2018 6:32 PM

## 2018-10-12 ENCOUNTER — Other Ambulatory Visit: Payer: Self-pay

## 2018-10-12 ENCOUNTER — Encounter: Payer: Self-pay | Admitting: Oncology

## 2018-10-12 ENCOUNTER — Inpatient Hospital Stay (HOSPITAL_BASED_OUTPATIENT_CLINIC_OR_DEPARTMENT_OTHER): Payer: Medicare Other | Admitting: Oncology

## 2018-10-12 ENCOUNTER — Inpatient Hospital Stay: Payer: Medicare Other

## 2018-10-12 VITALS — BP 138/63 | HR 63 | Resp 20 | Wt 150.4 lb

## 2018-10-12 DIAGNOSIS — E785 Hyperlipidemia, unspecified: Secondary | ICD-10-CM | POA: Diagnosis not present

## 2018-10-12 DIAGNOSIS — D693 Immune thrombocytopenic purpura: Secondary | ICD-10-CM

## 2018-10-12 DIAGNOSIS — D649 Anemia, unspecified: Secondary | ICD-10-CM

## 2018-10-12 DIAGNOSIS — R5383 Other fatigue: Secondary | ICD-10-CM | POA: Diagnosis not present

## 2018-10-12 DIAGNOSIS — C9110 Chronic lymphocytic leukemia of B-cell type not having achieved remission: Secondary | ICD-10-CM

## 2018-10-12 DIAGNOSIS — I6523 Occlusion and stenosis of bilateral carotid arteries: Secondary | ICD-10-CM

## 2018-10-12 DIAGNOSIS — R531 Weakness: Secondary | ICD-10-CM | POA: Diagnosis not present

## 2018-10-12 LAB — CBC WITH DIFFERENTIAL/PLATELET
Abs Immature Granulocytes: 0 10*3/uL (ref 0.00–0.07)
Basophils Absolute: 0 10*3/uL (ref 0.0–0.1)
Basophils Relative: 0 %
Eosinophils Absolute: 0.1 10*3/uL (ref 0.0–0.5)
Eosinophils Relative: 8 %
HCT: 23.7 % — ABNORMAL LOW (ref 39.0–52.0)
Hemoglobin: 7.6 g/dL — ABNORMAL LOW (ref 13.0–17.0)
Immature Granulocytes: 0 %
Lymphocytes Relative: 33 %
Lymphs Abs: 0.2 10*3/uL — ABNORMAL LOW (ref 0.7–4.0)
MCH: 31.3 pg (ref 26.0–34.0)
MCHC: 32.1 g/dL (ref 30.0–36.0)
MCV: 97.5 fL (ref 80.0–100.0)
Monocytes Absolute: 0.1 10*3/uL (ref 0.1–1.0)
Monocytes Relative: 14 %
Neutro Abs: 0.3 10*3/uL — ABNORMAL LOW (ref 1.7–7.7)
Neutrophils Relative %: 45 %
Platelets: 40 10*3/uL — ABNORMAL LOW (ref 150–400)
RBC: 2.43 MIL/uL — ABNORMAL LOW (ref 4.22–5.81)
RDW: 20.1 % — ABNORMAL HIGH (ref 11.5–15.5)
Smear Review: NORMAL
WBC Morphology: ABNORMAL
WBC: 0.6 10*3/uL — CL (ref 4.0–10.5)
nRBC: 0 % (ref 0.0–0.2)

## 2018-10-12 LAB — COMPREHENSIVE METABOLIC PANEL
ALT: 15 U/L (ref 0–44)
AST: 18 U/L (ref 15–41)
Albumin: 3.5 g/dL (ref 3.5–5.0)
Alkaline Phosphatase: 114 U/L (ref 38–126)
Anion gap: 9 (ref 5–15)
BUN: 40 mg/dL — ABNORMAL HIGH (ref 8–23)
CO2: 29 mmol/L (ref 22–32)
Calcium: 9 mg/dL (ref 8.9–10.3)
Chloride: 105 mmol/L (ref 98–111)
Creatinine, Ser: 1.39 mg/dL — ABNORMAL HIGH (ref 0.61–1.24)
GFR calc Af Amer: 56 mL/min — ABNORMAL LOW (ref >60)
GFR calc non Af Amer: 48 mL/min — ABNORMAL LOW (ref >60)
Glucose, Bld: 178 mg/dL — ABNORMAL HIGH (ref 70–99)
Potassium: 4.5 mmol/L (ref 3.5–5.1)
Sodium: 143 mmol/L (ref 135–145)
Total Bilirubin: 1 mg/dL (ref 0.3–1.2)
Total Protein: 5.9 g/dL — ABNORMAL LOW (ref 6.5–8.1)

## 2018-10-12 LAB — SAMPLE TO BLOOD BANK

## 2018-10-12 MED ORDER — PEGFILGRASTIM-CBQV 6 MG/0.6ML ~~LOC~~ SOSY
6.0000 mg | PREFILLED_SYRINGE | Freq: Once | SUBCUTANEOUS | Status: AC
  Administered 2018-10-12: 6 mg via SUBCUTANEOUS

## 2018-10-12 NOTE — Progress Notes (Signed)
Patient denies any concerns today.  

## 2018-10-12 NOTE — Progress Notes (Signed)
Per MD patient to get [place] today

## 2018-10-13 ENCOUNTER — Inpatient Hospital Stay: Payer: Medicare Other

## 2018-10-23 NOTE — Progress Notes (Signed)
[name]  Telephone:(336) [phone] Fax:(336) [phone]  ID: Scott [name] OB: 26-Aug-1940  MR#: [id number]  IRC#:[id number]  Patient Care Team: [name], MD as PCP - General (Family Medicine) Lloyd Huger, MD as Consulting Physician (Oncology) Clent Jacks, MD as Consulting Physician (Ophthalmology) Dorothy Spark, MD as Consulting Physician (Cardiology) Madelon Lips, MD as Consulting Physician (Nephrology)  CHIEF COMPLAINT: 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  INTERVAL HISTORY: Patient returns to clinic today for further evaluation and reconsideration of cycle 2, day 1 of Treanda and Rituxan.  He continues to have chronic weakness and fatigue, but otherwise feels well.  He does not complain of any easy bleeding or bruising. He denies any recent fevers or illnesses.  He denies any dizziness or other neurologic complaints. He has a good appetite and denies weight loss. He denies any night sweats. He has noted no lymphadenopathy.  He has no chest pain, shortness of breath, cough, or hemoptysis.  He denies any nausea, vomiting, constipation, or diarrhea. He has no urinary complaints.  Patient offers no further specific complaints today.  REVIEW OF SYSTEMS:   Review of Systems  Constitutional: Positive for malaise/fatigue. Negative for fever and weight loss.  HENT: Negative.  Negative for nosebleeds.   Respiratory: Negative.  Negative for cough and shortness of breath.   Cardiovascular: Negative.  Negative for chest pain and leg swelling.  Gastrointestinal: Negative.  Negative for abdominal pain, blood in stool and melena.  Genitourinary: Negative.  Negative for dysuria and hematuria.  Musculoskeletal: Negative.  Negative for back pain and neck pain.  Skin: Negative.  Negative for itching and rash.  Neurological: Negative.  Negative for dizziness, sensory change, focal weakness, weakness and headaches.  Endo/Heme/Allergies: Bruises/bleeds  easily.  Psychiatric/Behavioral: Negative.  The patient is not nervous/anxious.     As per HPI. Otherwise, a complete review of systems is negative.  PAST MEDICAL HISTORY: Past Medical History:  Diagnosis Date  . Acquired hand deformity 1962   hand saw accident at work  . Anemia 10/11/2011  . Arthritis   . Bradycardia 10/10/2011  . CAD (coronary artery disease) 10/10/2011   MI s/p PTCA (Dx-OM2 proximal concentric stenosis)  . CKD (chronic kidney disease) stage 3, GFR 30-59 ml/min (HCC)    [name]  . CLL (chronic lymphocytic leukemia) ([place])   . Colon polyps   . Generalized headaches    frequent  . GI bleed 07/08/2017  . Glaucoma    s/p laser surgery  . HLD (hyperlipidemia)   . Hypertension   . Hypothyroidism 10/10/2011  . Thrombocytopenia ([place]) 10/11/2011  . Type 2 diabetes with nephropathy [name])    DM refresher course [facility] (04/2013)    PAST SURGICAL HISTORY: Past Surgical History:  Procedure Laterality Date  . BACK SURGERY     cervical neck  . CATARACT EXTRACTION, BILATERAL Bilateral 2017  . COLONOSCOPY  11/2008   1 polyp, diverticulosis, rec rpt 5 yrs (Dr. Oletta Lamas, [name])  . COLONOSCOPY  06/2014   hyperplastic polyp, rpt 5 yrs ([facility])  . COLONOSCOPY WITH PROPOFOL N/A 11/17/2017   TA, HP, (Vanga, Tally Due, MD)  . ESOPHAGOGASTRODUODENOSCOPY (EGD) WITH PROPOFOL N/A 11/17/2017   healing erosive gastritis, intestinal metaplasia, neg H pylori (Vanga, Tally Due, MD)  . EYE SURGERY  2012   laser surgery for glaucoma  . PTCA  1994, 1995  . US ECHOCARDIOGRAPHY  10/2013   inferior wall hypokinesis, mild LVH, EF 50-55%, mild MR and LA dilation  FAMILY HISTORY: Family History  Problem Relation Age of Onset  . Diabetes Sister   . Stomach cancer Sister 80  . Pneumonia Father        caused death  . Other Brother        no communication with brother so unsure of any health conditions  . Coronary artery disease Son 88       5v CABG and stents  . Hyperlipidemia Sister   .  Stroke Neg Hx   . Heart attack Neg Hx     ADVANCED DIRECTIVES (Y/N):  N  HEALTH MAINTENANCE: Social History   Tobacco Use  . Smoking status: Former Smoker    Quit date: 03/04/1978    Years since quitting: 40.6  . Smokeless tobacco: Never Used  Substance Use Topics  . Alcohol use: No  . Drug use: No     Colonoscopy:  PAP:  Bone density:  Lipid panel:  No Known Allergies  Current Outpatient Medications  Medication Sig Dispense Refill  . acetaminophen (TYLENOL) 650 MG CR tablet Take 650 mg by mouth every 8 (eight) hours as needed for pain.    Marland Kitchen atorvastatin (LIPITOR) 80 MG tablet Take 80 mg by mouth daily.    . carvedilol (COREG) 6.25 MG tablet TAKE 1 TABLET BY MOUTH TWICE DAILY (MAKE  ANNUAL  APPOINTMENT  FOR  FUTURE  REFILLS) 180 tablet 0  . docusate sodium (COLACE) 100 MG capsule Take 1 capsule (100 mg total) by mouth 2 (two) times daily. 10 capsule 0  . glucose blood (ONE TOUCH ULTRA TEST) test strip 1 each by Other route 4 (four) times daily. and as needed to check sugars Dx code:  E11.21 400 each 3  . insulin aspart (NOVOLOG FLEXPEN) 100 UNIT/ML FlexPen Take 5 units with meals, take 10 units if mealtime sugar >250 15 mL 11  . Insulin Detemir (LEVEMIR FLEXTOUCH) 100 UNIT/ML Pen INJECT 25 UNITS SUBCUTANEOUSLY ONCE DAILY AT  10PM 15 pen 3  . levothyroxine (SYNTHROID) 88 MCG tablet Take 1 tablet (88 mcg total) by mouth daily. 90 tablet 1  . nitroGLYCERIN (NITROSTAT) 0.4 MG SL tablet Place 1 tablet (0.4 mg total) under the tongue every 5 (five) minutes as needed for chest pain ((MAX of 3 doses)). 25 tablet 0  . ondansetron (ZOFRAN) 8 MG tablet Take 1 tablet (8 mg total) by mouth 2 (two) times daily as needed for refractory nausea / vomiting. 30 tablet 2  . pantoprazole (PROTONIX) 40 MG tablet Take 1 tablet (40 mg total) by mouth daily. 90 tablet 0  . prochlorperazine (COMPAZINE) 10 MG tablet Take 1 tablet (10 mg total) by mouth every 6 (six) hours as needed (Nausea or vomiting). 60  tablet 2  . traZODone (DESYREL) 100 MG tablet Take 1 tablet (100 mg total) by mouth at bedtime as needed for sleep. 90 tablet 1   No current facility-administered medications for this visit.    Facility-Administered Medications Ordered in Other Visits  Medication Dose Route Frequency Provider Last Rate Last Dose  . heparin lock flush 100 unit/mL  500 Units Intracatheter Once PRN Lloyd Huger, MD        OBJECTIVE: Vitals:   10/26/18 0910  BP: (!) 150/70  Pulse: (!) 52  Temp: 97.8 F (36.6 C)     Body mass index is 21.48 kg/m.    ECOG FS:0 - Asymptomatic  General: Well-developed, well-nourished, no acute distress. Eyes: Pink conjunctiva, anicteric sclera. HEENT: Normocephalic, moist mucous membranes. Lungs: Clear to  auscultation bilaterally. Heart: Regular rate and rhythm. No rubs, murmurs, or gallops. Abdomen: Soft, nontender, nondistended. No organomegaly noted, normoactive bowel sounds. Musculoskeletal: No edema, cyanosis, or clubbing. Neuro: Alert, answering all questions appropriately. Cranial nerves grossly intact. Skin: No rashes or petechiae noted. Psych: Normal affect.  LAB RESULTS:  Lab Results  Component Value Date   NA 143 10/26/2018   K 4.3 10/26/2018   CL 109 10/26/2018   CO2 27 10/26/2018   GLUCOSE 106 (H) 10/26/2018   BUN 27 (H) 10/26/2018   CREATININE 1.19 10/26/2018   CALCIUM 9.0 10/26/2018   PROT 6.1 (L) 10/26/2018   ALBUMIN 3.6 10/26/2018   AST 18 10/26/2018   ALT 14 10/26/2018   ALKPHOS 117 10/26/2018   BILITOT 0.7 10/26/2018   GFRNONAA 58 (L) 10/26/2018   GFRAA >60 10/26/2018    Lab Results  Component Value Date   WBC 2.7 (L) 10/26/2018   NEUTROABS 2.0 10/26/2018   HGB 7.3 (L) 10/26/2018   HCT 23.2 (L) 10/26/2018   MCV 103.1 (H) 10/26/2018   PLT 111 (L) 10/26/2018     STUDIES: No results found.  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  PLAN:    1. CLL: Repeat bone marrow biopsy on Jul 28, 2018  reviewed independently with 73% involvement of CLL.  Previous bone marrow biopsy on October 23, 2017 revealed only 31% involvement.  Previously, peripheral blood FISH testing 50% incidence of mutation in the ATM gene which is commonly associated with deletion 11q. 11q- is associated with an unfavorable prognosis and high risk of not responding to initial treatment. 13q- is a more common mutation and is actually associated with a more favorable prognosis. Patient has clear progression of disease in his bone marrow along with a worsening transfusion requirement.  He received 5 weekly cycles of Rituxan with mild improvement of his platelet count.  Patient initially received cycle 1 of Rituxan and Treanda on July 13 and 14, but given persistent neutropenia treatment has been delayed.  He received Udenyca 2 weeks ago with improvement of his white count.  Proceed with cycle 2, day 1 of treatment today.  Return to clinic tomorrow for Rituxan and on pro-Neulasta.  Patient will then return to clinic weekly for laboratory work and then in 4 weeks for further evaluation and consideration of cycle 3, day 1. 2.  Thrombocytopenia: Significantly improved.  Patient has received high-dose prednisone, IVIG, and Rituxan with no appreciable durability to improve his platelet count.   3.  Anemia: Hemoglobin has trended down to 7.3.  Patient has not required a blood transfusion approximately 3 to 4 weeks.  No transfusion needed at this time.  Patient had colonoscopy on November 17, 2017 that removed 2 nonmalignant polyps.  EGD on the same day revealed nonbleeding erosive gastropathy with multiple nonbleeding duodenal ulcers.  Consider referral back to GI after COVID-19 and chemotherapy is complete.  4.  Reaction to Rituxan: Rate-based.  Patient will require premedications for any further infusions with Rituxan.  These have been entered into his treatment plan. 5.  Easy bruising: Resolved. 6.  Nosebleeds: Resolved.  Patient was  previously given a referral back to ENT for further evaluation. 7.  Rash: Resolved. 8.  Neutropenia: Continue with OnPro Neulasta for day 2 of each cycle.  Patient expressed understanding and was in agreement with this plan. He also understands that He can call clinic at any time with any questions, concerns, or complaints.    Lloyd Huger,  MD   10/26/2018 1:55 PM

## 2018-10-26 ENCOUNTER — Other Ambulatory Visit: Payer: Self-pay

## 2018-10-26 ENCOUNTER — Inpatient Hospital Stay: Payer: Medicare Other

## 2018-10-26 ENCOUNTER — Inpatient Hospital Stay (HOSPITAL_BASED_OUTPATIENT_CLINIC_OR_DEPARTMENT_OTHER): Payer: Medicare Other | Admitting: Oncology

## 2018-10-26 ENCOUNTER — Encounter: Payer: Self-pay | Admitting: Oncology

## 2018-10-26 VITALS — BP 150/70 | HR 52 | Temp 97.8°F | Ht 71.0 in | Wt 154.0 lb

## 2018-10-26 VITALS — BP 155/68 | HR 78 | Temp 97.8°F | Resp 20

## 2018-10-26 DIAGNOSIS — D649 Anemia, unspecified: Secondary | ICD-10-CM

## 2018-10-26 DIAGNOSIS — C9110 Chronic lymphocytic leukemia of B-cell type not having achieved remission: Secondary | ICD-10-CM

## 2018-10-26 DIAGNOSIS — I6523 Occlusion and stenosis of bilateral carotid arteries: Secondary | ICD-10-CM

## 2018-10-26 DIAGNOSIS — Z136 Encounter for screening for cardiovascular disorders: Secondary | ICD-10-CM | POA: Diagnosis not present

## 2018-10-26 DIAGNOSIS — R531 Weakness: Secondary | ICD-10-CM | POA: Diagnosis not present

## 2018-10-26 DIAGNOSIS — R5383 Other fatigue: Secondary | ICD-10-CM | POA: Diagnosis not present

## 2018-10-26 DIAGNOSIS — D693 Immune thrombocytopenic purpura: Secondary | ICD-10-CM | POA: Diagnosis not present

## 2018-10-26 DIAGNOSIS — E785 Hyperlipidemia, unspecified: Secondary | ICD-10-CM | POA: Diagnosis not present

## 2018-10-26 LAB — COMPREHENSIVE METABOLIC PANEL
ALT: 14 U/L (ref 0–44)
AST: 18 U/L (ref 15–41)
Albumin: 3.6 g/dL (ref 3.5–5.0)
Alkaline Phosphatase: 117 U/L (ref 38–126)
Anion gap: 7 (ref 5–15)
BUN: 27 mg/dL — ABNORMAL HIGH (ref 8–23)
CO2: 27 mmol/L (ref 22–32)
Calcium: 9 mg/dL (ref 8.9–10.3)
Chloride: 109 mmol/L (ref 98–111)
Creatinine, Ser: 1.19 mg/dL (ref 0.61–1.24)
GFR calc Af Amer: >60 mL/min (ref >60)
GFR calc non Af Amer: 58 mL/min — ABNORMAL LOW (ref >60)
Glucose, Bld: 106 mg/dL — ABNORMAL HIGH (ref 70–99)
Potassium: 4.3 mmol/L (ref 3.5–5.1)
Sodium: 143 mmol/L (ref 135–145)
Total Bilirubin: 0.7 mg/dL (ref 0.3–1.2)
Total Protein: 6.1 g/dL — ABNORMAL LOW (ref 6.5–8.1)

## 2018-10-26 LAB — CBC WITH DIFFERENTIAL/PLATELET
Abs Immature Granulocytes: 0.03 10*3/uL (ref 0.00–0.07)
Basophils Absolute: 0 10*3/uL (ref 0.0–0.1)
Basophils Relative: 0 %
Eosinophils Absolute: 0.1 10*3/uL (ref 0.0–0.5)
Eosinophils Relative: 4 %
HCT: 23.2 % — ABNORMAL LOW (ref 39.0–52.0)
Hemoglobin: 7.3 g/dL — ABNORMAL LOW (ref 13.0–17.0)
Immature Granulocytes: 1 %
Lymphocytes Relative: 13 %
Lymphs Abs: 0.3 10*3/uL — ABNORMAL LOW (ref 0.7–4.0)
MCH: 32.4 pg (ref 26.0–34.0)
MCHC: 31.5 g/dL (ref 30.0–36.0)
MCV: 103.1 fL — ABNORMAL HIGH (ref 80.0–100.0)
Monocytes Absolute: 0.2 10*3/uL (ref 0.1–1.0)
Monocytes Relative: 7 %
Neutro Abs: 2 10*3/uL (ref 1.7–7.7)
Neutrophils Relative %: 75 %
Platelets: 111 10*3/uL — ABNORMAL LOW (ref 150–400)
RBC: 2.25 MIL/uL — ABNORMAL LOW (ref 4.22–5.81)
RDW: 21.6 % — ABNORMAL HIGH (ref 11.5–15.5)
WBC: 2.7 10*3/uL — ABNORMAL LOW (ref 4.0–10.5)
nRBC: 0 % (ref 0.0–0.2)

## 2018-10-26 LAB — SAMPLE TO BLOOD BANK

## 2018-10-26 MED ORDER — HEPARIN SOD (PORK) LOCK FLUSH 100 UNIT/ML IV SOLN: 500.0000 [IU] | Freq: Once | INTRAVENOUS | Status: DC | PRN

## 2018-10-26 MED ORDER — SODIUM CHLORIDE 0.9 % IV SOLN
Freq: Once | INTRAVENOUS | Status: AC
  Administered 2018-10-26: 11:00:00 via INTRAVENOUS
  Filled 2018-10-26: qty 250

## 2018-10-26 MED ORDER — PALONOSETRON HCL INJECTION 0.25 MG/5ML: 0.2500 mg | Freq: Once | INTRAVENOUS | Status: DC

## 2018-10-26 MED ORDER — METHYLPREDNISOLONE SODIUM SUCC 125 MG IJ SOLR
125.0000 mg | Freq: Once | INTRAMUSCULAR | Status: AC | PRN
  Administered 2018-10-26: 12:00:00 125 mg via INTRAVENOUS

## 2018-10-26 MED ORDER — DEXAMETHASONE SODIUM PHOSPHATE 10 MG/ML IJ SOLN
10.0000 mg | Freq: Once | INTRAMUSCULAR | Status: AC
  Administered 2018-10-26: 10 mg via INTRAVENOUS
  Filled 2018-10-26: qty 1

## 2018-10-26 MED ORDER — SODIUM CHLORIDE 0.9 % IV SOLN
70.0000 mg/m2 | Freq: Once | INTRAVENOUS | Status: AC
  Administered 2018-10-26: 125 mg via INTRAVENOUS
  Filled 2018-10-26: qty 5

## 2018-10-26 MED ORDER — PALONOSETRON HCL INJECTION 0.25 MG/5ML
0.2500 mg | Freq: Once | INTRAVENOUS | Status: AC
  Administered 2018-10-26: 13:00:00 0.25 mg via INTRAVENOUS
  Filled 2018-10-26: qty 5

## 2018-10-26 MED ORDER — DIPHENHYDRAMINE HCL 25 MG PO CAPS
25.0000 mg | ORAL_CAPSULE | Freq: Once | ORAL | Status: AC
  Administered 2018-10-26: 25 mg via ORAL
  Filled 2018-10-26: qty 1

## 2018-10-26 MED ORDER — SODIUM CHLORIDE 0.9 % IV SOLN
Freq: Once | INTRAVENOUS | Status: AC
  Administered 2018-10-26: 11:00:00 via INTRAVENOUS
  Filled 2018-10-26: qty 180

## 2018-10-26 MED ORDER — FAMOTIDINE IN NACL 20-0.9 MG/50ML-% IV SOLN
20.0000 mg | Freq: Once | INTRAVENOUS | Status: AC | PRN
  Administered 2018-10-26: 12:00:00 20 mg via INTRAVENOUS

## 2018-10-26 MED ORDER — ACETAMINOPHEN 325 MG PO TABS
650.0000 mg | ORAL_TABLET | Freq: Once | ORAL | Status: AC
  Administered 2018-10-26: 650 mg via ORAL
  Filled 2018-10-26: qty 2

## 2018-10-26 MED ORDER — DIPHENHYDRAMINE HCL 50 MG/ML IJ SOLN
50.0000 mg | Freq: Once | INTRAMUSCULAR | Status: AC | PRN
  Administered 2018-10-26: 12:00:00 50 mg via INTRAVENOUS

## 2018-10-26 NOTE — Progress Notes (Signed)
Patient stated that he had been doing well with no complaints. 

## 2018-10-26 NOTE — Progress Notes (Signed)
Shortly after starting [name] Scott [name] started to complain of chest heaviness and a severe headache. [name] was stopped at 1140 and a NS bolus was started. Scott Baller, [name] was called and came to assess the patient. Vital signs remained stable throughout the reaction. Benadryl, solumedrol, pepcid was given and a 12 lead EKG was performed. EKG was normal and looked over by Scott Baller, [name]. Per Scott [name] and Dr. [name], do not restart [name] and just give [name] today. Patient tolerated [place] without any complications. He was sent home to get rest and will return tomorrow for day 2 [place].

## 2018-10-27 ENCOUNTER — Inpatient Hospital Stay: Payer: Medicare Other

## 2018-10-27 ENCOUNTER — Encounter: Payer: Self-pay | Admitting: Oncology

## 2018-10-27 ENCOUNTER — Other Ambulatory Visit: Payer: Self-pay

## 2018-10-27 VITALS — BP 125/65 | HR 67 | Temp 97.5°F | Resp 18

## 2018-10-27 DIAGNOSIS — R5383 Other fatigue: Secondary | ICD-10-CM | POA: Diagnosis not present

## 2018-10-27 DIAGNOSIS — D649 Anemia, unspecified: Secondary | ICD-10-CM | POA: Diagnosis not present

## 2018-10-27 DIAGNOSIS — E785 Hyperlipidemia, unspecified: Secondary | ICD-10-CM | POA: Diagnosis not present

## 2018-10-27 DIAGNOSIS — R531 Weakness: Secondary | ICD-10-CM | POA: Diagnosis not present

## 2018-10-27 DIAGNOSIS — D693 Immune thrombocytopenic purpura: Secondary | ICD-10-CM | POA: Diagnosis not present

## 2018-10-27 DIAGNOSIS — C9110 Chronic lymphocytic leukemia of B-cell type not having achieved remission: Secondary | ICD-10-CM | POA: Diagnosis not present

## 2018-10-27 MED ORDER — DEXAMETHASONE SODIUM PHOSPHATE 10 MG/ML IJ SOLN
10.0000 mg | Freq: Once | INTRAMUSCULAR | Status: AC
  Administered 2018-10-27: 14:00:00 10 mg via INTRAVENOUS
  Filled 2018-10-27: qty 1

## 2018-10-27 MED ORDER — SODIUM CHLORIDE 0.9 % IV SOLN
Freq: Once | INTRAVENOUS | Status: AC
  Administered 2018-10-27: 14:00:00 via INTRAVENOUS
  Filled 2018-10-27: qty 250

## 2018-10-27 MED ORDER — SODIUM CHLORIDE 0.9 % IV SOLN
70.0000 mg/m2 | Freq: Once | INTRAVENOUS | Status: AC
  Administered 2018-10-27: 125 mg via INTRAVENOUS
  Filled 2018-10-27: qty 5

## 2018-10-27 MED ORDER — PEGFILGRASTIM 6 MG/0.6ML ~~LOC~~ PSKT
6.0000 mg | PREFILLED_SYRINGE | Freq: Once | SUBCUTANEOUS | Status: AC
  Administered 2018-10-27: 6 mg via SUBCUTANEOUS
  Filled 2018-10-27: qty 0.6

## 2018-10-27 NOTE — Progress Notes (Unsigned)
Infusion Reaction Progress Note  Date: 10/26/2018  Time: 1230  Pre-medications given: Decadron, Benadryl  Medication given: Rituxan  Reaction Type: Chest tightness and headache  Patient reported chest tightness and headache fairly quickly after initiation of Rituxan.  He has received several doses of Rituxan in the past.  This is his first dose of [name] (bio-similar).  Vital signs remained stable throughout entire reaction.  He received 125 mg Solu-Medrol, IV Pepcid and IV Benadryl.  Patient noted a fuzzy feeling in his head.  Given he had chest tightness, performed EKG which revealed normal sinus rhythm.  Symptoms resolved about 15 minutes after receiving medications.  We did not continue Rituxan per patient.  Hx of reaction: Yes.  Reported chest pain after receiving Rituxan Treanda last month.  This occurred 48 hours after the initial dosing.   Reaction medication: Rituxan and Treanda  Re-challenge: Stop Rituxan.  Okay to give Treanda per Dr. [name]  Assessment  Physical Exam  Constitutional: He is oriented to person, place, and time and well-developed, well-nourished, and in no distress. Vital signs are normal.  HENT:  Head: Normocephalic and atraumatic.  Eyes: Pupils are equal, round, and reactive to light.  Neck: Normal range of motion.  Cardiovascular: Normal rate, regular rhythm and normal heart sounds.  No murmur heard. Pulmonary/Chest: Effort normal and breath sounds normal. He has no wheezes.  Abdominal: Soft. Normal appearance and bowel sounds are normal. He exhibits no distension. There is no abdominal tenderness.  Musculoskeletal: Normal range of motion.        General: No edema.  Neurological: He is alert and oriented to person, place, and time. Gait normal.  Skin: Skin is warm and dry. No rash noted.  Psychiatric: Mood, memory, affect and judgment normal.    Faythe Casa, NP 10/27/2018 3:50 PM   CC: Dr. [name] updated.

## 2018-10-29 ENCOUNTER — Ambulatory Visit (INDEPENDENT_AMBULATORY_CARE_PROVIDER_SITE_OTHER): Payer: Medicare Other | Admitting: Family Medicine

## 2018-10-29 ENCOUNTER — Other Ambulatory Visit (INDEPENDENT_AMBULATORY_CARE_PROVIDER_SITE_OTHER): Payer: Medicare Other

## 2018-10-29 ENCOUNTER — Encounter: Payer: Self-pay | Admitting: Family Medicine

## 2018-10-29 VITALS — BP 141/60 | HR 63 | Ht 71.5 in | Wt 154.0 lb

## 2018-10-29 DIAGNOSIS — IMO0002 Reserved for concepts with insufficient information to code with codable children: Secondary | ICD-10-CM

## 2018-10-29 DIAGNOSIS — C9110 Chronic lymphocytic leukemia of B-cell type not having achieved remission: Secondary | ICD-10-CM | POA: Diagnosis not present

## 2018-10-29 DIAGNOSIS — E1121 Type 2 diabetes mellitus with diabetic nephropathy: Secondary | ICD-10-CM

## 2018-10-29 DIAGNOSIS — D693 Immune thrombocytopenic purpura: Secondary | ICD-10-CM

## 2018-10-29 DIAGNOSIS — N183 Chronic kidney disease, stage 3 unspecified: Secondary | ICD-10-CM

## 2018-10-29 DIAGNOSIS — E1165 Type 2 diabetes mellitus with hyperglycemia: Secondary | ICD-10-CM | POA: Diagnosis not present

## 2018-10-29 LAB — POCT GLYCOSYLATED HEMOGLOBIN (HGB A1C): Hemoglobin A1C: 7.6 % — AB (ref 4.0–5.6)

## 2018-10-29 NOTE — Assessment & Plan Note (Signed)
Marked improvement in creatinine recently Cr down to 1.2 (baseline was around 1.7-1.9). he does see nephrology.

## 2018-10-29 NOTE — Assessment & Plan Note (Signed)
Appreciate onc care.  

## 2018-10-29 NOTE — Assessment & Plan Note (Addendum)
Chronic, stable. Recent high after IV solumedrol after reaction to [name] infusion this week. Continue current regimen. He will come in this afternoon for POC A1c and drop off sugar log

## 2018-10-29 NOTE — Progress Notes (Signed)
Virtual visit attempted through MyChart then Doxy.Me. Due to national recommendations of social distancing due to COVID-19, a virtual visit is felt to be most appropriate for this patient at this time. Reviewed limitations of a virtual visit. Interactive audio and video telecommunications were attempted between myself and [name], however failed due to patient having technical difficulties. We continued and completed visit with audio only.  Time: 9:04am - 9:19am   Patient location: home Provider location: [facility], office If any vitals were documented, they were collected by patient at home unless specified below.    BP (!) 141/60   Pulse 63   Ht 5' 11.5" (1.816 m)   Wt 154 lb (69.9 kg)   BMI 21.18 kg/m    CC: 4 mo f/u visit Subjective:    Patient ID: Scott [name], male    DOB: 1941/01/25, 78 y.o.   MRN: [id number]  HPI: Scott [name] is a 78 y.o. male presenting on 10/29/2018 for Follow-up (3 mo f/u.  States BS has been a little elevated due to steroid administered. )   CLL with thrombocytopenia due to ITP - regularly sees Dr Maryjane Hurter. Increased bone marrow involvement noted with progression of disease. On Rituxan and Neulasta. Ongoing anemia Hgb 7.3. Last blood transfusion was 1 mo ago.  DM (brittle) - does regularly check sugars and keeps log. This week cbg 140-210s, isolated highs 400s for a day after steroid IV on Tuesday, this morning fasting cbg 159. Elevated readings attributed to prednisone he's receiving. Compliant with antihyperglycemic regimen which includes: levemir 25u nightly, insulin aspart 5u with meals (10u if CBG >250). Denies low sugars or hypoglycemic symptoms. Denies paresthesias. Last diabetic eye exam 03/2018. Pneumovax: 2013. Prevnar: 2015. Glucometer brand: onetouch. DSME: refresher course 03/2018. Lab Results  Component Value Date   HGBA1C 7.5 (H) 06/24/2018   Diabetic Foot Exam - Simple   No data filed     Lab Results  Component  Value Date   MICROALBUR 5.6 (H) 03/27/2018         Relevant past medical, surgical, family and social history reviewed and updated as indicated. Interim medical history since our last visit reviewed. Allergies and medications reviewed and updated. Outpatient Medications Prior to Visit  Medication Sig Dispense Refill  . acetaminophen (TYLENOL) 650 MG CR tablet Take 650 mg by mouth every 8 (eight) hours as needed for pain.    Marland Kitchen atorvastatin (LIPITOR) 80 MG tablet Take 80 mg by mouth daily.    . carvedilol (COREG) 6.25 MG tablet TAKE 1 TABLET BY MOUTH TWICE DAILY (MAKE  ANNUAL  APPOINTMENT  FOR  FUTURE  REFILLS) 180 tablet 0  . docusate sodium (COLACE) 100 MG capsule Take 1 capsule (100 mg total) by mouth 2 (two) times daily. 10 capsule 0  . glucose blood (ONE TOUCH ULTRA TEST) test strip 1 each by Other route 4 (four) times daily. and as needed to check sugars Dx code:  E11.21 400 each 3  . insulin aspart (NOVOLOG FLEXPEN) 100 UNIT/ML FlexPen Take 5 units with meals, take 10 units if mealtime sugar >250 15 mL 11  . Insulin Detemir (LEVEMIR FLEXTOUCH) 100 UNIT/ML Pen INJECT 25 UNITS SUBCUTANEOUSLY ONCE DAILY AT  10PM 15 pen 3  . levothyroxine (SYNTHROID) 88 MCG tablet Take 1 tablet (88 mcg total) by mouth daily. 90 tablet 1  . nitroGLYCERIN (NITROSTAT) 0.4 MG SL tablet Place 1 tablet (0.4 mg total) under the tongue every 5 (five) minutes as needed  for chest pain ((MAX of 3 doses)). 25 tablet 0  . ondansetron (ZOFRAN) 8 MG tablet Take 1 tablet (8 mg total) by mouth 2 (two) times daily as needed for refractory nausea / vomiting. 30 tablet 2  . pantoprazole (PROTONIX) 40 MG tablet Take 1 tablet (40 mg total) by mouth daily. 90 tablet 0  . prochlorperazine (COMPAZINE) 10 MG tablet Take 1 tablet (10 mg total) by mouth every 6 (six) hours as needed (Nausea or vomiting). 60 tablet 2  . traZODone (DESYREL) 100 MG tablet Take 1 tablet (100 mg total) by mouth at bedtime as needed for sleep. 90 tablet 1    No facility-administered medications prior to visit.      Per HPI unless specifically indicated in ROS section below Review of Systems Objective:    BP (!) 141/60   Pulse 63   Ht 5' 11.5" (1.816 m)   Wt 154 lb (69.9 kg)   BMI 21.18 kg/m   Wt Readings from Last 3 Encounters:  10/29/18 154 lb (69.9 kg)  10/26/18 154 lb (69.9 kg)  10/12/18 150 lb 6.4 oz (68.2 kg)     Physical exam: Gen: alert, NAD, not ill appearing Pulm: speaks in complete sentences without increased work of breathing Psych: normal mood, normal thought content      Results for orders placed or performed in visit on 10/26/18  Comprehensive metabolic panel  Result Value Ref Range   Sodium 143 135 - 145 mmol/L   Potassium 4.3 3.5 - 5.1 mmol/L   Chloride 109 98 - 111 mmol/L   CO2 27 22 - 32 mmol/L   Glucose, Bld 106 (H) 70 - 99 mg/dL   BUN 27 (H) 8 - 23 mg/dL   Creatinine, Ser 1.19 0.61 - 1.24 mg/dL   Calcium 9.0 8.9 - 10.3 mg/dL   Total Protein 6.1 (L) 6.5 - 8.1 g/dL   Albumin 3.6 3.5 - 5.0 g/dL   AST 18 15 - 41 U/L   ALT 14 0 - 44 U/L   Alkaline Phosphatase 117 38 - 126 U/L   Total Bilirubin 0.7 0.3 - 1.2 mg/dL   GFR calc non Af Amer 58 (L) >60 mL/min   GFR calc Af Amer >60 >60 mL/min   Anion gap 7 5 - 15  CBC with Differential  Result Value Ref Range   WBC 2.7 (L) 4.0 - 10.5 K/uL   RBC 2.25 (L) 4.22 - 5.81 MIL/uL   Hemoglobin 7.3 (L) 13.0 - 17.0 g/dL   HCT 23.2 (L) 39.0 - 52.0 %   MCV 103.1 (H) 80.0 - 100.0 fL   MCH 32.4 26.0 - 34.0 pg   MCHC 31.5 30.0 - 36.0 g/dL   RDW 21.6 (H) 11.5 - 15.5 %   Platelets 111 (L) 150 - 400 K/uL   nRBC 0.0 0.0 - 0.2 %   Neutrophils Relative % 75 %   Neutro Abs 2.0 1.7 - 7.7 K/uL   Lymphocytes Relative 13 %   Lymphs Abs 0.3 (L) 0.7 - 4.0 K/uL   Monocytes Relative 7 %   Monocytes Absolute 0.2 0.1 - 1.0 K/uL   Eosinophils Relative 4 %   Eosinophils Absolute 0.1 0.0 - 0.5 K/uL   Basophils Relative 0 %   Basophils Absolute 0.0 0.0 - 0.1 K/uL   Immature  Granulocytes 1 %   Abs Immature Granulocytes 0.03 0.00 - 0.07 K/uL  Hold Tube- Blood Bank  Result Value Ref Range   Blood Bank Specimen SAMPLE AVAILABLE  FOR TESTING    Sample Expiration      10/29/2018,2359 Performed at [facility], [address] [place], [place] [address]    Assessment & Plan:   Problem List Items Addressed This Visit    Uncontrolled type 2 diabetes mellitus with nephropathy ([place]) - Primary    Chronic, stable. Recent high after IV solumedrol after reaction to [name] infusion this week. Continue current regimen. He will come in this afternoon for POC A1c and drop off sugar log      Relevant Orders   POCT glycosylated hemoglobin (Hb A1C)   Idiopathic thrombocytopenic purpura (ITP) (HCC)   CLL (chronic lymphocytic leukemia) ([place])    Appreciate onc care.       CKD (chronic kidney disease) stage 3, GFR 30-59 ml/min (HCC)    Marked improvement in creatinine recently Cr down to 1.2 (baseline was around 1.7-1.9). he does see nephrology.          No orders of the defined types were placed in this encounter.  Orders Placed This Encounter  Procedures  . POCT glycosylated hemoglobin (Hb A1C)    Standing Status:   Future    Standing Expiration Date:   11/29/2018    I discussed the assessment and treatment plan with the patient. The patient was provided an opportunity to ask questions and all were answered. The patient agreed with the plan and demonstrated an understanding of the instructions. The patient was advised to call back or seek an in-person evaluation if the symptoms worsen or if the condition fails to improve as anticipated.  Follow up plan: No follow-ups on file.  [name], MD

## 2018-11-02 ENCOUNTER — Inpatient Hospital Stay: Payer: Medicare Other

## 2018-11-02 ENCOUNTER — Other Ambulatory Visit: Payer: Self-pay

## 2018-11-02 DIAGNOSIS — D693 Immune thrombocytopenic purpura: Secondary | ICD-10-CM | POA: Diagnosis not present

## 2018-11-02 DIAGNOSIS — E785 Hyperlipidemia, unspecified: Secondary | ICD-10-CM | POA: Diagnosis not present

## 2018-11-02 DIAGNOSIS — D649 Anemia, unspecified: Secondary | ICD-10-CM

## 2018-11-02 DIAGNOSIS — C9110 Chronic lymphocytic leukemia of B-cell type not having achieved remission: Secondary | ICD-10-CM

## 2018-11-02 DIAGNOSIS — R531 Weakness: Secondary | ICD-10-CM | POA: Diagnosis not present

## 2018-11-02 DIAGNOSIS — R5383 Other fatigue: Secondary | ICD-10-CM | POA: Diagnosis not present

## 2018-11-02 LAB — COMPREHENSIVE METABOLIC PANEL
ALT: 15 U/L (ref 0–44)
AST: 21 U/L (ref 15–41)
Albumin: 3.7 g/dL (ref 3.5–5.0)
Alkaline Phosphatase: 169 U/L — ABNORMAL HIGH (ref 38–126)
Anion gap: 8 (ref 5–15)
BUN: 32 mg/dL — ABNORMAL HIGH (ref 8–23)
CO2: 31 mmol/L (ref 22–32)
Calcium: 8.8 mg/dL — ABNORMAL LOW (ref 8.9–10.3)
Chloride: 106 mmol/L (ref 98–111)
Creatinine, Ser: 1.44 mg/dL — ABNORMAL HIGH (ref 0.61–1.24)
GFR calc Af Amer: 54 mL/min — ABNORMAL LOW (ref >60)
GFR calc non Af Amer: 46 mL/min — ABNORMAL LOW (ref >60)
Glucose, Bld: 131 mg/dL — ABNORMAL HIGH (ref 70–99)
Potassium: 4.6 mmol/L (ref 3.5–5.1)
Sodium: 145 mmol/L (ref 135–145)
Total Bilirubin: 0.9 mg/dL (ref 0.3–1.2)
Total Protein: 6 g/dL — ABNORMAL LOW (ref 6.5–8.1)

## 2018-11-02 LAB — CBC WITH DIFFERENTIAL/PLATELET
Abs Immature Granulocytes: 0.1 10*3/uL — ABNORMAL HIGH (ref 0.00–0.07)
Band Neutrophils: 13 %
Basophils Absolute: 0 10*3/uL (ref 0.0–0.1)
Basophils Relative: 0 %
Eosinophils Absolute: 0.2 10*3/uL (ref 0.0–0.5)
Eosinophils Relative: 2 %
HCT: 25.8 % — ABNORMAL LOW (ref 39.0–52.0)
Hemoglobin: 8.1 g/dL — ABNORMAL LOW (ref 13.0–17.0)
Lymphocytes Relative: 4 %
Lymphs Abs: 0.4 10*3/uL — ABNORMAL LOW (ref 0.7–4.0)
MCH: 33.3 pg (ref 26.0–34.0)
MCHC: 31.4 g/dL (ref 30.0–36.0)
MCV: 106.2 fL — ABNORMAL HIGH (ref 80.0–100.0)
Metamyelocytes Relative: 1 %
Monocytes Absolute: 0.5 10*3/uL (ref 0.1–1.0)
Monocytes Relative: 5 %
Neutro Abs: 8.4 10*3/uL — ABNORMAL HIGH (ref 1.7–7.7)
Neutrophils Relative %: 75 %
Platelets: 107 10*3/uL — ABNORMAL LOW (ref 150–400)
RBC: 2.43 MIL/uL — ABNORMAL LOW (ref 4.22–5.81)
RDW: 22.1 % — ABNORMAL HIGH (ref 11.5–15.5)
Smear Review: DECREASED
WBC: 9.6 10*3/uL (ref 4.0–10.5)
nRBC: 0 % (ref 0.0–0.2)

## 2018-11-02 LAB — SAMPLE TO BLOOD BANK

## 2018-11-04 ENCOUNTER — Ambulatory Visit (INDEPENDENT_AMBULATORY_CARE_PROVIDER_SITE_OTHER): Payer: Medicare Other | Admitting: Family Medicine

## 2018-11-04 ENCOUNTER — Encounter: Payer: Self-pay | Admitting: Family Medicine

## 2018-11-04 ENCOUNTER — Other Ambulatory Visit: Payer: Self-pay

## 2018-11-04 VITALS — BP 122/64 | HR 83 | Temp 97.6°F | Ht 71.5 in | Wt 154.2 lb

## 2018-11-04 DIAGNOSIS — E1165 Type 2 diabetes mellitus with hyperglycemia: Secondary | ICD-10-CM | POA: Diagnosis not present

## 2018-11-04 DIAGNOSIS — Z23 Encounter for immunization: Secondary | ICD-10-CM | POA: Diagnosis not present

## 2018-11-04 DIAGNOSIS — B354 Tinea corporis: Secondary | ICD-10-CM | POA: Diagnosis not present

## 2018-11-04 DIAGNOSIS — R21 Rash and other nonspecific skin eruption: Secondary | ICD-10-CM | POA: Diagnosis not present

## 2018-11-04 DIAGNOSIS — C9110 Chronic lymphocytic leukemia of B-cell type not having achieved remission: Secondary | ICD-10-CM | POA: Diagnosis not present

## 2018-11-04 DIAGNOSIS — I6523 Occlusion and stenosis of bilateral carotid arteries: Secondary | ICD-10-CM

## 2018-11-04 DIAGNOSIS — IMO0002 Reserved for concepts with insufficient information to code with codable children: Secondary | ICD-10-CM

## 2018-11-04 DIAGNOSIS — E1121 Type 2 diabetes mellitus with diabetic nephropathy: Secondary | ICD-10-CM

## 2018-11-04 LAB — POCT SKIN KOH: Skin KOH, POC: POSITIVE — AB

## 2018-11-04 MED ORDER — TERBINAFINE HCL 250 MG PO TABS: 250.0000 mg | ORAL_TABLET | Freq: Every day | ORAL | 0 refills | Status: DC

## 2018-11-04 MED ORDER — CLOTRIMAZOLE 1 % EX CREA: 1.0000 "application " | TOPICAL_CREAM | Freq: Two times a day (BID) | CUTANEOUS | 0 refills | Status: DC

## 2018-11-04 NOTE — Patient Instructions (Addendum)
Flu shot today Skin scraping today to check for fungal infection. We will call you with results. If normal, will refer you to skin doctor for further evaluation.

## 2018-11-04 NOTE — Assessment & Plan Note (Addendum)
KOH positive today. Will treat for disseminated tinea corporis with topical antifungal lotrimin and recommend 7d course of oral terbinafine - but pt to check with onc first in active CLL treatment. Update if not better with this.

## 2018-11-04 NOTE — Progress Notes (Signed)
This visit was conducted in person.  BP 122/64 (BP Location: Left Arm, Patient Position: Sitting, Cuff Size: Normal)   Pulse 83   Temp 97.6 F (36.4 C) (Temporal)   Ht 5' 11.5" (1.816 m)   Wt 154 lb 3 oz (69.9 kg)   SpO2 95%   BMI 21.21 kg/m    CC: check rash Subjective:    Patient ID: Scott [name], male    DOB: 04/25/1940, 78 y.o.   MRN: [id number]  HPI: Scott [name] is a 78 y.o. male presenting on 11/04/2018 for Rash (C/o rash all the way around waist and medial sides of bilateral thighs.  Noticed about 2 mos ago.  Areas are itchy.  Tried Benadryl gel, helpful with itching. )   Recurrent itchy rash on trunk around waist as well as lower legs (also present last year) itching improved with benadryl cream and moisturizing cream.   Onc doesn't think related to meds (Rituxan [place] and Neulasta) in h/o CLL and ITP. Last received dexamethasone IV 8/25.   No new foods.  No new lotions, detergents, soaps or shampoos.      Relevant past medical, surgical, family and social history reviewed and updated as indicated. Interim medical history since our last visit reviewed. Allergies and medications reviewed and updated. Outpatient Medications Prior to Visit  Medication Sig Dispense Refill  . acetaminophen (TYLENOL) 650 MG CR tablet Take 650 mg by mouth every 8 (eight) hours as needed for pain.    Marland Kitchen atorvastatin (LIPITOR) 80 MG tablet Take 80 mg by mouth daily.    . carvedilol (COREG) 6.25 MG tablet TAKE 1 TABLET BY MOUTH TWICE DAILY (MAKE  ANNUAL  APPOINTMENT  FOR  FUTURE  REFILLS) 180 tablet 0  . docusate sodium (COLACE) 100 MG capsule Take 1 capsule (100 mg total) by mouth 2 (two) times daily. 10 capsule 0  . glucose blood (ONE TOUCH ULTRA TEST) test strip 1 each by Other route 4 (four) times daily. and as needed to check sugars Dx code:  E11.21 400 each 3  . insulin aspart (NOVOLOG FLEXPEN) 100 UNIT/ML FlexPen Take 5 units with meals, take 10 units if mealtime sugar >250 15 mL 11   . Insulin Detemir (LEVEMIR FLEXTOUCH) 100 UNIT/ML Pen INJECT 25 UNITS SUBCUTANEOUSLY ONCE DAILY AT  10PM 15 pen 3  . levothyroxine (SYNTHROID) 88 MCG tablet Take 1 tablet (88 mcg total) by mouth daily. 90 tablet 1  . nitroGLYCERIN (NITROSTAT) 0.4 MG SL tablet Place 1 tablet (0.4 mg total) under the tongue every 5 (five) minutes as needed for chest pain ((MAX of 3 doses)). 25 tablet 0  . ondansetron (ZOFRAN) 8 MG tablet Take 1 tablet (8 mg total) by mouth 2 (two) times daily as needed for refractory nausea / vomiting. 30 tablet 2  . pantoprazole (PROTONIX) 40 MG tablet Take 1 tablet (40 mg total) by mouth daily. 90 tablet 0  . prochlorperazine (COMPAZINE) 10 MG tablet Take 1 tablet (10 mg total) by mouth every 6 (six) hours as needed (Nausea or vomiting). 60 tablet 2  . traZODone (DESYREL) 100 MG tablet Take 1 tablet (100 mg total) by mouth at bedtime as needed for sleep. 90 tablet 1   No facility-administered medications prior to visit.      Per HPI unless specifically indicated in ROS section below Review of Systems Objective:    BP 122/64 (BP Location: Left Arm, Patient Position: Sitting, Cuff Size: Normal)   Pulse 83   Temp 97.6  F (36.4 C) (Temporal)   Ht 5' 11.5" (1.816 m)   Wt 154 lb 3 oz (69.9 kg)   SpO2 95%   BMI 21.21 kg/m   Wt Readings from Last 3 Encounters:  11/04/18 154 lb 3 oz (69.9 kg)  10/29/18 154 lb (69.9 kg)  10/26/18 154 lb (69.9 kg)    Physical Exam Skin:    General: Skin is warm.     Findings: Erythema and rash present.     Comments: Scaly pruritic erythematous rash throughout lower abd into groin, as well as lower back into buttock. Scaly erythematous rash also present on L>R anterior legs       Results for orders placed or performed in visit on 11/04/18  POCT Skin KOH  Result Value Ref Range   Skin KOH, POC Positive (A) Negative   Lab Results  Component Value Date   CREATININE 1.44 (H) 11/02/2018   BUN 32 (H) 11/02/2018   NA 145 11/02/2018   K  4.6 11/02/2018   CL 106 11/02/2018   CO2 31 11/02/2018    Lab Results  Component Value Date   ALT 15 11/02/2018   AST 21 11/02/2018   ALKPHOS 169 (H) 11/02/2018   BILITOT 0.9 11/02/2018    Lab Results  Component Value Date   WBC 9.6 11/02/2018   HGB 8.1 (L) 11/02/2018   HCT 25.8 (L) 11/02/2018   MCV 106.2 (H) 11/02/2018   PLT 107 (L) 11/02/2018    Assessment & Plan:   Problem List Items Addressed This Visit    Uncontrolled type 2 diabetes mellitus with nephropathy ([place])   Skin rash - Primary    KOH positive today. Will treat for disseminated tinea corporis with topical antifungal lotrimin and recommend 7d course of oral terbinafine - but pt to check with onc first in active CLL treatment. Update if not better with this.       Relevant Orders   POCT Skin KOH (Completed)   CLL (chronic lymphocytic leukemia) (HCC)   Relevant Medications   terbinafine (LAMISIL) 250 MG tablet    Other Visit Diagnoses    Need for influenza vaccination       Relevant Orders   Flu Vaccine QUAD High Dose(Fluad) (Completed)       Meds ordered this encounter  Medications  . clotrimazole (LOTRIMIN) 1 % cream    Sig: Apply 1 application topically 2 (two) times daily.    Dispense:  80 g    Refill:  0  . terbinafine (LAMISIL) 250 MG tablet    Sig: Take 1 tablet (250 mg total) by mouth daily.    Dispense:  7 tablet    Refill:  0    Check with oncology prior to starting medication   Orders Placed This Encounter  Procedures  . Flu Vaccine QUAD High Dose(Fluad)  . POCT Skin KOH    Follow up plan: No follow-ups on file.  [name], MD

## 2018-11-05 ENCOUNTER — Encounter: Payer: Self-pay | Admitting: Family Medicine

## 2018-11-05 ENCOUNTER — Telehealth: Payer: Self-pay | Admitting: *Deleted

## 2018-11-05 NOTE — Telephone Encounter (Signed)
Left message onpt voice mail that he does not need to be seen by [place] and to go ahead and take medicine his PCP ordered

## 2018-11-05 NOTE — Telephone Encounter (Signed)
Chart review shows that he was diagnosed with tinea corporis per PCP. I don't believe we need to see him for this. He is currently on treanda and rituxan for CLL.

## 2018-11-05 NOTE — Telephone Encounter (Signed)
Patient called reporting that he has a rash on his abdominal and legs for which he saw his PCP yesterday and ordered medicine for it. Patient asking if we need to see it before he starts medicine. Please advise

## 2018-11-05 NOTE — Telephone Encounter (Signed)
Agreed -

## 2018-11-12 ENCOUNTER — Telehealth: Payer: Self-pay | Admitting: Family Medicine

## 2018-11-12 NOTE — Telephone Encounter (Signed)
Spoke with pt to get details of med issue.  Pt denies any N/V/D.  Says it's causing constipation but it's working for the rash.  States he has 2 pills left but does not want to take them.  Pls advise.

## 2018-11-12 NOTE — Telephone Encounter (Signed)
Patient stated he was prescribed a medication for a rash and he stated he is having a really hard time with it.  He stated that it is making his stomach hurt and he would like to know what we recommend him do.   Patient is requesting a call back . C/b# [phone]

## 2018-11-13 NOTE — Telephone Encounter (Addendum)
Ok - don't take last 2 doses of terbinafine pill. Instead continue topical antifungal lotrimin (clotrimazole) until rash gone.

## 2018-11-13 NOTE — Telephone Encounter (Signed)
Spoke with pt relaying Dr. Darnell Level' instructions.  Pt verbalizes understanding.

## 2018-11-16 ENCOUNTER — Other Ambulatory Visit: Payer: Self-pay

## 2018-11-16 ENCOUNTER — Inpatient Hospital Stay: Payer: Medicare Other | Attending: Oncology

## 2018-11-16 DIAGNOSIS — I129 Hypertensive chronic kidney disease with stage 1 through stage 4 chronic kidney disease, or unspecified chronic kidney disease: Secondary | ICD-10-CM | POA: Diagnosis not present

## 2018-11-16 DIAGNOSIS — D693 Immune thrombocytopenic purpura: Secondary | ICD-10-CM | POA: Insufficient documentation

## 2018-11-16 DIAGNOSIS — L02423 Furuncle of right upper limb: Secondary | ICD-10-CM | POA: Diagnosis not present

## 2018-11-16 DIAGNOSIS — D649 Anemia, unspecified: Secondary | ICD-10-CM | POA: Diagnosis not present

## 2018-11-16 DIAGNOSIS — Z794 Long term (current) use of insulin: Secondary | ICD-10-CM | POA: Insufficient documentation

## 2018-11-16 DIAGNOSIS — L02424 Furuncle of left upper limb: Secondary | ICD-10-CM | POA: Insufficient documentation

## 2018-11-16 DIAGNOSIS — E785 Hyperlipidemia, unspecified: Secondary | ICD-10-CM | POA: Diagnosis not present

## 2018-11-16 DIAGNOSIS — R531 Weakness: Secondary | ICD-10-CM | POA: Insufficient documentation

## 2018-11-16 DIAGNOSIS — L0232 Furuncle of buttock: Secondary | ICD-10-CM | POA: Diagnosis not present

## 2018-11-16 DIAGNOSIS — D701 Agranulocytosis secondary to cancer chemotherapy: Secondary | ICD-10-CM | POA: Diagnosis not present

## 2018-11-16 DIAGNOSIS — R7989 Other specified abnormal findings of blood chemistry: Secondary | ICD-10-CM | POA: Insufficient documentation

## 2018-11-16 DIAGNOSIS — C9110 Chronic lymphocytic leukemia of B-cell type not having achieved remission: Secondary | ICD-10-CM | POA: Diagnosis not present

## 2018-11-16 DIAGNOSIS — E1122 Type 2 diabetes mellitus with diabetic chronic kidney disease: Secondary | ICD-10-CM | POA: Diagnosis not present

## 2018-11-16 DIAGNOSIS — Z8249 Family history of ischemic heart disease and other diseases of the circulatory system: Secondary | ICD-10-CM | POA: Diagnosis not present

## 2018-11-16 DIAGNOSIS — Z79899 Other long term (current) drug therapy: Secondary | ICD-10-CM | POA: Diagnosis not present

## 2018-11-16 DIAGNOSIS — R5383 Other fatigue: Secondary | ICD-10-CM | POA: Insufficient documentation

## 2018-11-16 DIAGNOSIS — N183 Chronic kidney disease, stage 3 (moderate): Secondary | ICD-10-CM | POA: Diagnosis not present

## 2018-11-16 LAB — CBC WITH DIFFERENTIAL/PLATELET
Abs Immature Granulocytes: 0.01 10*3/uL (ref 0.00–0.07)
Basophils Absolute: 0 10*3/uL (ref 0.0–0.1)
Basophils Relative: 0 %
Eosinophils Absolute: 0.1 10*3/uL (ref 0.0–0.5)
Eosinophils Relative: 4 %
HCT: 26.4 % — ABNORMAL LOW (ref 39.0–52.0)
Hemoglobin: 8.1 g/dL — ABNORMAL LOW (ref 13.0–17.0)
Immature Granulocytes: 0 %
Lymphocytes Relative: 9 %
Lymphs Abs: 0.3 10*3/uL — ABNORMAL LOW (ref 0.7–4.0)
MCH: 32.8 pg (ref 26.0–34.0)
MCHC: 30.7 g/dL (ref 30.0–36.0)
MCV: 106.9 fL — ABNORMAL HIGH (ref 80.0–100.0)
Monocytes Absolute: 0.3 10*3/uL (ref 0.1–1.0)
Monocytes Relative: 9 %
Neutro Abs: 2.5 10*3/uL (ref 1.7–7.7)
Neutrophils Relative %: 78 %
Platelets: 75 10*3/uL — ABNORMAL LOW (ref 150–400)
RBC: 2.47 MIL/uL — ABNORMAL LOW (ref 4.22–5.81)
RDW: 18 % — ABNORMAL HIGH (ref 11.5–15.5)
WBC: 3.3 10*3/uL — ABNORMAL LOW (ref 4.0–10.5)
nRBC: 0 % (ref 0.0–0.2)

## 2018-11-16 LAB — COMPREHENSIVE METABOLIC PANEL
ALT: 15 U/L (ref 0–44)
AST: 18 U/L (ref 15–41)
Albumin: 3.7 g/dL (ref 3.5–5.0)
Alkaline Phosphatase: 118 U/L (ref 38–126)
Anion gap: 7 (ref 5–15)
BUN: 35 mg/dL — ABNORMAL HIGH (ref 8–23)
CO2: 29 mmol/L (ref 22–32)
Calcium: 9.3 mg/dL (ref 8.9–10.3)
Chloride: 108 mmol/L (ref 98–111)
Creatinine, Ser: 1.3 mg/dL — ABNORMAL HIGH (ref 0.61–1.24)
GFR calc Af Amer: >60 mL/min (ref >60)
GFR calc non Af Amer: 52 mL/min — ABNORMAL LOW (ref >60)
Glucose, Bld: 122 mg/dL — ABNORMAL HIGH (ref 70–99)
Potassium: 4.9 mmol/L (ref 3.5–5.1)
Sodium: 144 mmol/L (ref 135–145)
Total Bilirubin: 0.6 mg/dL (ref 0.3–1.2)
Total Protein: 6.2 g/dL — ABNORMAL LOW (ref 6.5–8.1)

## 2018-11-17 ENCOUNTER — Encounter: Payer: Self-pay | Admitting: Family Medicine

## 2018-11-17 ENCOUNTER — Ambulatory Visit (INDEPENDENT_AMBULATORY_CARE_PROVIDER_SITE_OTHER): Payer: Medicare Other | Admitting: Family Medicine

## 2018-11-17 ENCOUNTER — Ambulatory Visit (INDEPENDENT_AMBULATORY_CARE_PROVIDER_SITE_OTHER)
Admission: RE | Admit: 2018-11-17 | Discharge: 2018-11-17 | Disposition: A | Discharge status: Home / Self Care | Payer: Medicare Other | Source: Ambulatory Visit | Attending: Family Medicine | Admitting: Family Medicine

## 2018-11-17 VITALS — BP 118/80 | HR 68 | Temp 98.2°F | Ht 71.5 in | Wt 151.3 lb

## 2018-11-17 DIAGNOSIS — S40812A Abrasion of left upper arm, initial encounter: Secondary | ICD-10-CM | POA: Diagnosis not present

## 2018-11-17 DIAGNOSIS — B354 Tinea corporis: Secondary | ICD-10-CM

## 2018-11-17 DIAGNOSIS — L03317 Cellulitis of buttock: Secondary | ICD-10-CM

## 2018-11-17 DIAGNOSIS — M19072 Primary osteoarthritis, left ankle and foot: Secondary | ICD-10-CM | POA: Diagnosis not present

## 2018-11-17 DIAGNOSIS — I6523 Occlusion and stenosis of bilateral carotid arteries: Secondary | ICD-10-CM | POA: Diagnosis not present

## 2018-11-17 DIAGNOSIS — T148XXA Other injury of unspecified body region, initial encounter: Secondary | ICD-10-CM

## 2018-11-17 DIAGNOSIS — M79672 Pain in left foot: Secondary | ICD-10-CM

## 2018-11-17 HISTORY — DX: Cellulitis of buttock: L03.317

## 2018-11-17 MED ORDER — DOXYCYCLINE HYCLATE 100 MG PO TABS: 100.0000 mg | ORAL_TABLET | Freq: Two times a day (BID) | ORAL | 0 refills | Status: DC

## 2018-11-17 NOTE — Progress Notes (Signed)
This visit was conducted in person.  BP 118/80 (BP Location: Left Arm, Patient Position: Sitting, Cuff Size: Normal)   Pulse 68   Temp 98.2 F (36.8 C) (Tympanic)   Ht 5' 11.5" (1.816 m)   Wt 151 lb 5 oz (68.6 kg)   SpO2 99%   BMI 20.81 kg/m    CC: check knot on back, several other concerns Subjective:    Patient ID: Scott [name], male    DOB: January 14, 1941, 78 y.o.   MRN: [id number]  HPI: Scott [name] is a 78 y.o. male presenting on 11/17/2018 for Cyst (C/o knot on low back at top of buttocks.  Noticed 3-4 days ago. Area is painful. ), Blister (C/o blister on UEs. Noticed 11/14/18.  Pt broke blister on left arm, drained clear fluid. ), and Foot Pain (C/o pain on medial side of left foot.  Started about 1 wk ago. )   1 wk h/o knot on lower back at L buttock, painful. Denies inciting trauma/injury or bug bites. Has tried topical clotrimazole cream to area.   Noticed large blister distal upper L arm he self drained with needle - clear fluid. Now with new pustule R upper arm   Several wk h/o L medial foot pain. Denies inciting trauma/injury.  No h/o gout.  Struggles with known arthritis of bilateral knees - requests handicap placard. Uses voltaren gel with benefit.   Diffuse tinea dx by positive KOH s/p treatment with terbinafine 1 wk course, did not tolerate full regimen, stopped early due to GI upset. Rash continues improving      Relevant past medical, surgical, family and social history reviewed and updated as indicated. Interim medical history since our last visit reviewed. Allergies and medications reviewed and updated. Outpatient Medications Prior to Visit  Medication Sig Dispense Refill  . acetaminophen (TYLENOL) 650 MG CR tablet Take 650 mg by mouth every 8 (eight) hours as needed for pain.    Marland Kitchen atorvastatin (LIPITOR) 80 MG tablet Take 80 mg by mouth daily.    . carvedilol (COREG) 6.25 MG tablet TAKE 1 TABLET BY MOUTH TWICE DAILY (MAKE  ANNUAL  APPOINTMENT  FOR  FUTURE   REFILLS) 180 tablet 0  . clotrimazole (LOTRIMIN) 1 % cream Apply 1 application topically 2 (two) times daily. 80 g 0  . docusate sodium (COLACE) 100 MG capsule Take 1 capsule (100 mg total) by mouth 2 (two) times daily. 10 capsule 0  . glucose blood (ONE TOUCH ULTRA TEST) test strip 1 each by Other route 4 (four) times daily. and as needed to check sugars Dx code:  E11.21 400 each 3  . insulin aspart (NOVOLOG FLEXPEN) 100 UNIT/ML FlexPen Take 5 units with meals, take 10 units if mealtime sugar >250 15 mL 11  . Insulin Detemir (LEVEMIR FLEXTOUCH) 100 UNIT/ML Pen INJECT 25 UNITS SUBCUTANEOUSLY ONCE DAILY AT  10PM 15 pen 3  . levothyroxine (SYNTHROID) 88 MCG tablet Take 1 tablet (88 mcg total) by mouth daily. 90 tablet 1  . nitroGLYCERIN (NITROSTAT) 0.4 MG SL tablet Place 1 tablet (0.4 mg total) under the tongue every 5 (five) minutes as needed for chest pain ((MAX of 3 doses)). 25 tablet 0  . ondansetron (ZOFRAN) 8 MG tablet Take 1 tablet (8 mg total) by mouth 2 (two) times daily as needed for refractory nausea / vomiting. 30 tablet 2  . pantoprazole (PROTONIX) 40 MG tablet Take 1 tablet (40 mg total) by mouth daily. 90 tablet 0  . prochlorperazine (  COMPAZINE) 10 MG tablet Take 1 tablet (10 mg total) by mouth every 6 (six) hours as needed (Nausea or vomiting). 60 tablet 2  . traZODone (DESYREL) 100 MG tablet Take 1 tablet (100 mg total) by mouth at bedtime as needed for sleep. 90 tablet 1  . terbinafine (LAMISIL) 250 MG tablet Take 1 tablet (250 mg total) by mouth daily. 7 tablet 0   No facility-administered medications prior to visit.      Per HPI unless specifically indicated in ROS section below Review of Systems Objective:    BP 118/80 (BP Location: Left Arm, Patient Position: Sitting, Cuff Size: Normal)   Pulse 68   Temp 98.2 F (36.8 C) (Tympanic)   Ht 5' 11.5" (1.816 m)   Wt 151 lb 5 oz (68.6 kg)   SpO2 99%   BMI 20.81 kg/m   Wt Readings from Last 3 Encounters:  11/17/18 151 lb 5  oz (68.6 kg)  11/04/18 154 lb 3 oz (69.9 kg)  10/29/18 154 lb (69.9 kg)    Physical Exam Vitals signs and nursing note reviewed.  Constitutional:      General: He is not in acute distress.    Appearance: Normal appearance. He is ill-appearing (chronically frail appearing).  Musculoskeletal: Normal range of motion.     Comments:  2+ DP on right, 1+ DP on left R foot WNL L foot:  No redness or edema No ankle ligament laxity or pain with testing ankle No pain at 1st MTPJ or at base of 5th MT No pain with percussion at malleoli No pain at achilles or with calcaneal squeeze Point tender along navicular bone.   Skin:    General: Skin is warm and dry.     Findings: Ecchymosis and erythema present.          Comments:  2 areas of denuded skin to L lateral distal upper arm proximal to elbow  L inner upper buttock with erythematous indurated area with central sore without fluctuance, unable to express drainage from lesion.   Neurological:     Mental Status: He is alert.       Results for orders placed or performed in visit on 11/16/18  Comprehensive metabolic panel  Result Value Ref Range   Sodium 144 135 - 145 mmol/L   Potassium 4.9 3.5 - 5.1 mmol/L   Chloride 108 98 - 111 mmol/L   CO2 29 22 - 32 mmol/L   Glucose, Bld 122 (H) 70 - 99 mg/dL   BUN 35 (H) 8 - 23 mg/dL   Creatinine, Ser 1.30 (H) 0.61 - 1.24 mg/dL   Calcium 9.3 8.9 - 10.3 mg/dL   Total Protein 6.2 (L) 6.5 - 8.1 g/dL   Albumin 3.7 3.5 - 5.0 g/dL   AST 18 15 - 41 U/L   ALT 15 0 - 44 U/L   Alkaline Phosphatase 118 38 - 126 U/L   Total Bilirubin 0.6 0.3 - 1.2 mg/dL   GFR calc non Af Amer 52 (L) >60 mL/min   GFR calc Af Amer >60 >60 mL/min   Anion gap 7 5 - 15  CBC with Differential  Result Value Ref Range   WBC 3.3 (L) 4.0 - 10.5 K/uL   RBC 2.47 (L) 4.22 - 5.81 MIL/uL   Hemoglobin 8.1 (L) 13.0 - 17.0 g/dL   HCT 26.4 (L) 39.0 - 52.0 %   MCV 106.9 (H) 80.0 - 100.0 fL   MCH 32.8 26.0 - 34.0 pg   MCHC 30.7  30.0  - 36.0 g/dL   RDW 18.0 (H) 11.5 - 15.5 %   Platelets 75 (L) 150 - 400 K/uL   nRBC 0.0 0.0 - 0.2 %   Neutrophils Relative % 78 %   Neutro Abs 2.5 1.7 - 7.7 K/uL   Lymphocytes Relative 9 %   Lymphs Abs 0.3 (L) 0.7 - 4.0 K/uL   Monocytes Relative 9 %   Monocytes Absolute 0.3 0.1 - 1.0 K/uL   Eosinophils Relative 4 %   Eosinophils Absolute 0.1 0.0 - 0.5 K/uL   Basophils Relative 0 %   Basophils Absolute 0.0 0.0 - 0.1 K/uL   Immature Granulocytes 0 %   Abs Immature Granulocytes 0.01 0.00 - 0.07 K/uL   Assessment & Plan:   Problem List Items Addressed This Visit    Tinea corporis    This has improved with 5d terbinafine course (could not tolerate longer). Continue to monitor. Pancytopenia likely contributing.       Left foot pain - Primary    Xray to r/o navicular fracture - clear on my read. Anticipate tendonitis - rec topical voltaren.  Update if not improving with treatment.       Relevant Orders   DG Foot Complete Left   Cellulitis of buttock, left    Anticipate cellulitis with developing abscess, nothing to drain today. Treat with doxy course and warm compresses. Red flags to seek further care reviewed. Pt agrees with plan.       Abrasion of skin    Abrasions to left distal upper arm dressed with abx ointment and covered with adhesive bandage.           Meds ordered this encounter  Medications  . doxycycline (VIBRA-TABS) 100 MG tablet    Sig: Take 1 tablet (100 mg total) by mouth 2 (two) times daily.    Dispense:  14 tablet    Refill:  0   Orders Placed This Encounter  Procedures  . DG Foot Complete Left    Standing Status:   Future    Number of Occurrences:   1    Standing Expiration Date:   01/17/2020    Order Specific Question:   Reason for Exam (SYMPTOM  OR DIAGNOSIS REQUIRED)    Answer:   L navicular pain, no known trauma    Order Specific Question:   Preferred imaging location?    Answer:   [facility]    Order Specific Question:   Radiology  Contrast Protocol - do NOT remove file path    Answer:   [email or web address]    Patient Instructions  L foot xray today to rule out fracture.  Keep open wounds on arms covered with antibiotic ointment and bandaid.  I think you have skin infection to left buttock - treat with antibiotic sent to pharmacy, use warm compresses to area 3 times a day 15 min at a time.  Return if area coming to a head or worsening pain.    Follow up plan: Return if symptoms worsen or fail to improve.  [name], MD

## 2018-11-17 NOTE — Assessment & Plan Note (Signed)
Anticipate cellulitis with developing abscess, nothing to drain today. Treat with doxy course and warm compresses. Red flags to seek further care reviewed. Pt agrees with plan.

## 2018-11-17 NOTE — Assessment & Plan Note (Signed)
Xray to r/o navicular fracture - clear on my read. Anticipate tendonitis - rec topical voltaren.  Update if not improving with treatment.

## 2018-11-17 NOTE — Assessment & Plan Note (Signed)
This has improved with 5d terbinafine course (could not tolerate longer). Continue to monitor. Pancytopenia likely contributing.

## 2018-11-17 NOTE — Patient Instructions (Addendum)
L foot xray today to rule out fracture.  Keep open wounds on arms covered with antibiotic ointment and bandaid.  I think you have skin infection to left buttock - treat with antibiotic sent to pharmacy, use warm compresses to area 3 times a day 15 min at a time.  Return if area coming to a head or worsening pain.

## 2018-11-17 NOTE — Assessment & Plan Note (Signed)
Abrasions to left distal upper arm dressed with abx ointment and covered with adhesive bandage.

## 2018-11-20 ENCOUNTER — Other Ambulatory Visit: Payer: Self-pay

## 2018-11-23 ENCOUNTER — Encounter: Payer: Self-pay | Admitting: Surgery

## 2018-11-23 ENCOUNTER — Other Ambulatory Visit: Payer: Self-pay

## 2018-11-23 ENCOUNTER — Inpatient Hospital Stay: Payer: Medicare Other

## 2018-11-23 ENCOUNTER — Ambulatory Visit (INDEPENDENT_AMBULATORY_CARE_PROVIDER_SITE_OTHER): Payer: Medicare Other | Admitting: Surgery

## 2018-11-23 ENCOUNTER — Other Ambulatory Visit: Payer: Self-pay | Admitting: Surgery

## 2018-11-23 ENCOUNTER — Inpatient Hospital Stay (HOSPITAL_BASED_OUTPATIENT_CLINIC_OR_DEPARTMENT_OTHER): Payer: Medicare Other | Admitting: Oncology

## 2018-11-23 VITALS — BP 160/80 | HR 74 | Temp 97.7°F | Ht 72.0 in | Wt 153.0 lb

## 2018-11-23 VITALS — BP 161/85 | HR 81 | Temp 95.6°F | Resp 18 | Wt 153.0 lb

## 2018-11-23 DIAGNOSIS — C9110 Chronic lymphocytic leukemia of B-cell type not having achieved remission: Secondary | ICD-10-CM | POA: Diagnosis not present

## 2018-11-23 DIAGNOSIS — D649 Anemia, unspecified: Secondary | ICD-10-CM | POA: Diagnosis not present

## 2018-11-23 DIAGNOSIS — I6523 Occlusion and stenosis of bilateral carotid arteries: Secondary | ICD-10-CM | POA: Diagnosis not present

## 2018-11-23 DIAGNOSIS — D701 Agranulocytosis secondary to cancer chemotherapy: Secondary | ICD-10-CM | POA: Diagnosis not present

## 2018-11-23 DIAGNOSIS — D693 Immune thrombocytopenic purpura: Secondary | ICD-10-CM | POA: Diagnosis not present

## 2018-11-23 DIAGNOSIS — K611 Rectal abscess: Secondary | ICD-10-CM | POA: Diagnosis not present

## 2018-11-23 DIAGNOSIS — R7989 Other specified abnormal findings of blood chemistry: Secondary | ICD-10-CM | POA: Diagnosis not present

## 2018-11-23 DIAGNOSIS — L03317 Cellulitis of buttock: Secondary | ICD-10-CM

## 2018-11-23 DIAGNOSIS — R531 Weakness: Secondary | ICD-10-CM | POA: Diagnosis not present

## 2018-11-23 LAB — CBC WITH DIFFERENTIAL/PLATELET
Abs Immature Granulocytes: 0.01 10*3/uL (ref 0.00–0.07)
Basophils Absolute: 0 10*3/uL (ref 0.0–0.1)
Basophils Relative: 1 %
Eosinophils Absolute: 0.1 10*3/uL (ref 0.0–0.5)
Eosinophils Relative: 5 %
HCT: 26.1 % — ABNORMAL LOW (ref 39.0–52.0)
Hemoglobin: 8.3 g/dL — ABNORMAL LOW (ref 13.0–17.0)
Immature Granulocytes: 1 %
Lymphocytes Relative: 18 %
Lymphs Abs: 0.3 10*3/uL — ABNORMAL LOW (ref 0.7–4.0)
MCH: 33.3 pg (ref 26.0–34.0)
MCHC: 31.8 g/dL (ref 30.0–36.0)
MCV: 104.8 fL — ABNORMAL HIGH (ref 80.0–100.0)
Monocytes Absolute: 0.2 10*3/uL (ref 0.1–1.0)
Monocytes Relative: 9 %
Neutro Abs: 1.2 10*3/uL — ABNORMAL LOW (ref 1.7–7.7)
Neutrophils Relative %: 66 %
Platelets: 54 10*3/uL — ABNORMAL LOW (ref 150–400)
RBC: 2.49 MIL/uL — ABNORMAL LOW (ref 4.22–5.81)
RDW: 17 % — ABNORMAL HIGH (ref 11.5–15.5)
WBC: 1.7 10*3/uL — ABNORMAL LOW (ref 4.0–10.5)
nRBC: 0 % (ref 0.0–0.2)

## 2018-11-23 LAB — COMPREHENSIVE METABOLIC PANEL
ALT: 13 U/L (ref 0–44)
AST: 18 U/L (ref 15–41)
Albumin: 3.6 g/dL (ref 3.5–5.0)
Alkaline Phosphatase: 101 U/L (ref 38–126)
Anion gap: 7 (ref 5–15)
BUN: 33 mg/dL — ABNORMAL HIGH (ref 8–23)
CO2: 29 mmol/L (ref 22–32)
Calcium: 9.5 mg/dL (ref 8.9–10.3)
Chloride: 107 mmol/L (ref 98–111)
Creatinine, Ser: 1.31 mg/dL — ABNORMAL HIGH (ref 0.61–1.24)
GFR calc Af Amer: >60 mL/min (ref >60)
GFR calc non Af Amer: 52 mL/min — ABNORMAL LOW (ref >60)
Glucose, Bld: 56 mg/dL — ABNORMAL LOW (ref 70–99)
Potassium: 4.6 mmol/L (ref 3.5–5.1)
Sodium: 143 mmol/L (ref 135–145)
Total Bilirubin: 0.7 mg/dL (ref 0.3–1.2)
Total Protein: 6.4 g/dL — ABNORMAL LOW (ref 6.5–8.1)

## 2018-11-23 MED ORDER — SULFAMETHOXAZOLE-TRIMETHOPRIM 800-160 MG PO TABS: 1.0000 | ORAL_TABLET | Freq: Two times a day (BID) | ORAL | 0 refills | Status: DC

## 2018-11-23 NOTE — Patient Instructions (Addendum)
Dont Take the dressing off until Wednesday . Return in two weeks.

## 2018-11-23 NOTE — Progress Notes (Signed)
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  INTERVAL HISTORY: Patient returns to clinic today for further evaluation and consideration of cycle 3, day 1 of Treanda and Rituxan.  He continues to have chronic weakness and fatigue.  Appetite stable.  Complains of leison on his bottom/gluteal fold that is painful especially when he is sitting down.  Leison has been present for 2 to 3 weeks.  He was evaluated by Dr. Danise Mina and started on doxycycline 100 mg x 10 days.  Patient does not feel that it is improving and it has become very tender.  He also has noticed 2 new lesions on left and right arm that appeared to be boils or blisters.  He denies any recent fevers.  REVIEW OF SYSTEMS:   Review of Systems  Constitutional: Positive for malaise/fatigue.  Skin:       Several skin lesions; one on left and right arm and one on bottom  Neurological: Positive for weakness.   As per HPI. Otherwise, a complete review of systems is negative.  PAST MEDICAL HISTORY: Past Medical History:  Diagnosis Date  . Acquired hand deformity 1962   hand saw accident at work  . Anemia 10/11/2011  . Arthritis   . Bradycardia 10/10/2011  . CAD (coronary artery disease) 10/10/2011   MI s/p PTCA (Dx-OM2 proximal concentric stenosis)  . CKD (chronic kidney disease) stage 3, GFR 30-59 ml/min (HCC)    Scott [name]  . CLL (chronic lymphocytic leukemia) ([place])   . Colon polyps   . Generalized headaches    frequent  . GI bleed 07/08/2017  . Glaucoma    s/p  laser surgery  . HLD (hyperlipidemia)   . Hypertension   . Hypothyroidism 10/10/2011  . Thrombocytopenia ([place]) 10/11/2011  . Type 2 diabetes with nephropathy [facility])    DM refresher course [facility] (04/2013)    PAST SURGICAL HISTORY: Past Surgical History:  Procedure Laterality Date  . BACK SURGERY     cervical neck  . CATARACT EXTRACTION, BILATERAL Bilateral 2017  . COLONOSCOPY  11/2008   1 polyp, diverticulosis, rec rpt 5 yrs (Dr. Oletta Lamas, [name])  . COLONOSCOPY  06/2014   hyperplastic polyp, rpt 5 yrs ([place])  . COLONOSCOPY WITH PROPOFOL N/A 11/17/2017   TA, HP, (Vanga, Tally Due, MD)  . ESOPHAGOGASTRODUODENOSCOPY (EGD) WITH PROPOFOL N/A 11/17/2017   healing erosive gastritis, intestinal metaplasia, neg H pylori (Vanga, Tally Due, MD)  . EYE SURGERY  2012   laser surgery for glaucoma  . PTCA  1994, 1995  . US ECHOCARDIOGRAPHY  10/2013   inferior wall hypokinesis, mild LVH, EF 50-55%, mild MR and LA dilation    FAMILY HISTORY: Family History  Problem Relation Age of Onset  . Diabetes Sister   . Stomach cancer Sister 78  . Pneumonia Father        caused death  . Other Brother        no communication with brother so unsure of any health conditions  . Coronary artery disease Son 71       5v CABG and stents  . Hyperlipidemia Sister   .  Stroke Neg Hx   . Heart attack Neg Hx     ADVANCED DIRECTIVES (Y/N):  N  HEALTH MAINTENANCE: Social History   Tobacco Use  . Smoking status: Former Smoker    Quit date: 03/04/1978    Years since quitting: 40.7  . Smokeless tobacco: Never Used  Substance Use Topics  . Alcohol use: No  . Drug use: No     Colonoscopy:  PAP:  Bone density:  Lipid panel:  No Known Allergies  Current Outpatient Medications  Medication Sig Dispense Refill  . acetaminophen (TYLENOL) 650 MG CR tablet Take 650 mg by mouth every 8 (eight) hours as needed for pain.    Marland Kitchen atorvastatin (LIPITOR) 80 MG tablet Take 80 mg by mouth daily.    . carvedilol (COREG)  6.25 MG tablet TAKE 1 TABLET BY MOUTH TWICE DAILY (MAKE  ANNUAL  APPOINTMENT  FOR  FUTURE  REFILLS) 180 tablet 0  . clotrimazole (LOTRIMIN) 1 % cream Apply 1 application topically 2 (two) times daily. 80 g 0  . docusate sodium (COLACE) 100 MG capsule Take 1 capsule (100 mg total) by mouth 2 (two) times daily. 10 capsule 0  . doxycycline (VIBRA-TABS) 100 MG tablet Take 1 tablet (100 mg total) by mouth 2 (two) times daily. 14 tablet 0  . glucose blood (ONE TOUCH ULTRA TEST) test strip 1 each by Other route 4 (four) times daily. and as needed to check sugars Dx code:  E11.21 400 each 3  . insulin aspart (NOVOLOG FLEXPEN) 100 UNIT/ML FlexPen Take 5 units with meals, take 10 units if mealtime sugar >250 15 mL 11  . Insulin Detemir (LEVEMIR FLEXTOUCH) 100 UNIT/ML Pen INJECT 25 UNITS SUBCUTANEOUSLY ONCE DAILY AT  10PM 15 pen 3  . levothyroxine (SYNTHROID) 88 MCG tablet Take 1 tablet (88 mcg total) by mouth daily. 90 tablet 1  . nitroGLYCERIN (NITROSTAT) 0.4 MG SL tablet Place 1 tablet (0.4 mg total) under the tongue every 5 (five) minutes as needed for chest pain ((MAX of 3 doses)). 25 tablet 0  . ondansetron (ZOFRAN) 8 MG tablet Take 1 tablet (8 mg total) by mouth 2 (two) times daily as needed for refractory nausea / vomiting. 30 tablet 2  . pantoprazole (PROTONIX) 40 MG tablet Take 1 tablet (40 mg total) by mouth daily. 90 tablet 0  . prochlorperazine (COMPAZINE) 10 MG tablet Take 1 tablet (10 mg total) by mouth every 6 (six) hours as needed (Nausea or vomiting). 60 tablet 2  . traZODone (DESYREL) 100 MG tablet Take 1 tablet (100 mg total) by mouth at bedtime as needed for sleep. 90 tablet 1   No current facility-administered medications for this visit.     OBJECTIVE: There were no vitals filed for this visit.   There is no height or weight on file to calculate BMI.    ECOG FS:0 - Asymptomatic   Physical Exam Cardiovascular:     Rate and Rhythm: Normal rate and regular rhythm.     Pulses: Normal  pulses.     Heart sounds: Normal heart sounds.  Pulmonary:     Effort: Pulmonary effort is normal.     Breath sounds: Normal breath sounds.  Abdominal:     Palpations: Abdomen is soft.  Skin:    Findings: Lesion present.     Comments: 2 areas of denuded skin to L lateral distal upper arm proximal to elbow  L inner upper buttock with erythematous indurated area with central sore without fluctuance, unable to  express drainage from lesion.    Neurological:     Mental Status: He is alert.    Media Information   Media Information    Media Information     LAB RESULTS:  Lab Results  Component Value Date   NA 144 11/16/2018   K 4.9 11/16/2018   CL 108 11/16/2018   CO2 29 11/16/2018   GLUCOSE 122 (H) 11/16/2018   BUN 35 (H) 11/16/2018   CREATININE 1.30 (H) 11/16/2018   CALCIUM 9.3 11/16/2018   PROT 6.2 (L) 11/16/2018   ALBUMIN 3.7 11/16/2018   AST 18 11/16/2018   ALT 15 11/16/2018   ALKPHOS 118 11/16/2018   BILITOT 0.6 11/16/2018   GFRNONAA 52 (L) 11/16/2018   GFRAA >60 11/16/2018    Lab Results  Component Value Date   WBC 1.7 (L) 11/23/2018   NEUTROABS 1.2 (L) 11/23/2018   HGB 8.3 (L) 11/23/2018   HCT 26.1 (L) 11/23/2018   MCV 104.8 (H) 11/23/2018   PLT 54 (L) 11/23/2018     STUDIES: Dg Foot Complete Left  Result Date: 11/17/2018 CLINICAL DATA:  Left navicular pain. No known trauma. EXAM: LEFT FOOT - COMPLETE 3+ VIEW COMPARISON:  None. FINDINGS: There is no fracture or dislocation. Minimal arthritic change at the second MTP joint. Minimal dorsal spurring on the proximal navicular with slight chondrocalcinosis between the navicular and the cuneiforms only seen on the lateral view. The navicular otherwise appears normal. No other abnormalities. IMPRESSION: Minimal arthritic changes as described. Electronically Signed   By: Lorriane Shire M.D.   On: 11/17/2018 16:38    ASSESSMENT: CLL with ATM (11q-) mutation and 13q-, anemia, ITP.  PLAN:    1. CLL: Labs today  reveal significant thrombocytopenia and mild neutropenia.  ANC 1.2.  Platelet count 54,000.  Given worsening cellulitis on intergluteal cleft and decreased blood counts, will hold treatment today.  Return to clinic in 1 week repeat labs and reconsideration of cycle 3-day 1. 2.  Thrombocytopenia: Decreased.  Hold tx as above.  3.  Anemia: Hemoglobin is improved and is stable at 8.3 today. 4.  Reaction to Rituxan: Rate-based.  Patient will require premedications for any further infusions with Rituxan.  These have been entered into his treatment plan. 5.  Cellulitis to right intergluteal cleft: Unclear etiology.  Could be chemo induced.  Both Rituxan and Treanda have skin rash listed as adverse reactions both less than 17% experience this side effect.  Recently completed 10-day course of doxycycline.  May need I&D.  Induration present and tender; without fluctuance unable to express drainage. Will consult surgery.  Scheduled to see Dr. Candis Schatz office after this appt today for possible I&D. RX Bactrim 800-160 mg twice daily x10 days.  Continue course of doxycycline. No dose reduction required for creatinine clearance >30.  Current creatinine clearance 45.6 mL/min.  6.  Nosebleeds: Resolved.  Patient was previously given a referral back to ENT for further evaluation. 7.  Neutropenia: Hold treatment today.  Will require on prior Neulasta after each cycle.   Patient expressed understanding and was in agreement with this plan. He also understands that He can call clinic at any time with any questions, concerns, or complaints.   Greater than 50% was spent in counseling and coordination of care with this patient including but not limited to discussion of the relevant topics above (See A&P) including, but not limited to diagnosis and management of acute and chronic medical conditions.   Jacquelin Hawking, NP   11/23/2018 8:54  AM     

## 2018-11-24 ENCOUNTER — Inpatient Hospital Stay: Payer: Medicare Other

## 2018-11-24 ENCOUNTER — Encounter: Payer: Self-pay | Admitting: Surgery

## 2018-11-24 NOTE — Progress Notes (Signed)
Outpatient Surgical Follow Up  11/24/2018  Scott [name] is an 78 y.o. male.   Chief Complaint  Patient presents with  . Other    HPI: 78 year old male with multiple medical issues including chronic lymphocytic leukemia, thrombocytopenia, CAD.  Comes d from Oncology  Referred by Mrs. Burns NP.  He feels weak and a few days feels a knot around the perianal area.  It is intermittently causing him pain that is mild to moderate intensity.  No fevers no chills.  She has had a reaction to some of the Rituxin.  CBC shows a white count of 1.7 and a hemoglobin of 8.3 platelets of 54,000.  CMP normal except mild elevation of the creatinine of 1.3. He denies any fevers any chills.  He does report significant weakness.  Past Medical History:  Diagnosis Date  . Acquired hand deformity 1962   hand saw accident at work  . Anemia 10/11/2011  . Arthritis   . Bradycardia 10/10/2011  . CAD (coronary artery disease) 10/10/2011   MI s/p PTCA (Dx-OM2 proximal concentric stenosis)  . CKD (chronic kidney disease) stage 3, GFR 30-59 ml/min (HCC)    [name]  . CLL (chronic lymphocytic leukemia) ([place])   . Colon polyps   . Generalized headaches    frequent  . GI bleed 07/08/2017  . Glaucoma    s/p laser surgery  . HLD (hyperlipidemia)   . Hypertension   . Hypothyroidism 10/10/2011  . Thrombocytopenia ([place]) 10/11/2011  . Type 2 diabetes with nephropathy [facility])    DM refresher course [facility] (04/2013)    Past Surgical History:  Procedure Laterality Date  . BACK SURGERY     cervical neck  . CATARACT EXTRACTION, BILATERAL Bilateral 2017  . COLONOSCOPY  11/2008   1 polyp, diverticulosis, rec rpt 5 yrs (Dr. Oletta Lamas, [name])  . COLONOSCOPY  06/2014   hyperplastic polyp, rpt 5 yrs ([facility])  . COLONOSCOPY WITH PROPOFOL N/A 11/17/2017   TA, HP, (Vanga, Tally Due, MD)  . ESOPHAGOGASTRODUODENOSCOPY (EGD) WITH PROPOFOL N/A 11/17/2017   healing erosive gastritis, intestinal metaplasia, neg H pylori (Vanga, Tally Due,  MD)  . EYE SURGERY  2012   laser surgery for glaucoma  . PTCA  1994, 1995  . US ECHOCARDIOGRAPHY  10/2013   inferior wall hypokinesis, mild LVH, EF 50-55%, mild MR and LA dilation    Family History  Problem Relation Age of Onset  . Diabetes Sister   . Stomach cancer Sister 17  . Pneumonia Father        caused death  . Other Brother        no communication with brother so unsure of any health conditions  . Coronary artery disease Son 60       5v CABG and stents  . Hyperlipidemia Sister   . Stroke Neg Hx   . Heart attack Neg Hx     Social History:  reports that he quit smoking about 40 years ago. He has never used smokeless tobacco. He reports that he does not drink alcohol or use drugs.  Allergies: No Known Allergies  Medications reviewed.    ROS Full ROS performed and is otherwise negative other than what is stated in HPI   BP (!) 160/80   Pulse 74   Temp 97.7 F (36.5 C) (Skin)   Ht 6' (1.829 m)   Wt 153 lb (69.4 kg)   SpO2 97%   BMI 20.75 kg/m   Physical Exam Vitals signs and nursing note reviewed.  Exam conducted with a chaperone present.  Constitutional:      General: He is not in acute distress.    Appearance: He is normal weight. He is ill-appearing.  Eyes:     General: No scleral icterus.       Right eye: No discharge.        Left eye: No discharge.  Cardiovascular:     Rate and Rhythm: Normal rate and regular rhythm.     Heart sounds: No murmur.  Pulmonary:     Effort: Pulmonary effort is normal. No respiratory distress.     Breath sounds: No wheezing.  Abdominal:     General: Abdomen is flat. There is no distension.     Palpations: There is no mass.     Tenderness: There is no abdominal tenderness. There is no guarding.  Genitourinary:    Comments: Evidence of posterior perirectal abscess.  Some erythema fluctuance. Musculoskeletal: Normal range of motion.        General: No swelling.  Skin:    General: Skin is warm.     Capillary Refill:  Capillary refill takes less than 2 seconds.     Coloration: Skin is not jaundiced.  Neurological:     General: No focal deficit present.     Mental Status: He is alert.  Psychiatric:        Mood and Affect: Mood normal.        Behavior: Behavior normal.        Thought Content: Thought content normal.        Judgment: Judgment normal.        Results for orders placed or performed in visit on 11/23/18 (from the past 48 hour(s))  Comprehensive metabolic panel     Status: Abnormal   Collection Time: 11/23/18  8:38 AM  Result Value Ref Range   Sodium 143 135 - 145 mmol/L   Potassium 4.6 3.5 - 5.1 mmol/L   Chloride 107 98 - 111 mmol/L   CO2 29 22 - 32 mmol/L   Glucose, Bld 56 (L) 70 - 99 mg/dL   BUN 33 (H) 8 - 23 mg/dL   Creatinine, Ser 1.31 (H) 0.61 - 1.24 mg/dL   Calcium 9.5 8.9 - 10.3 mg/dL   Total Protein 6.4 (L) 6.5 - 8.1 g/dL   Albumin 3.6 3.5 - 5.0 g/dL   AST 18 15 - 41 U/L   ALT 13 0 - 44 U/L   Alkaline Phosphatase 101 38 - 126 U/L   Total Bilirubin 0.7 0.3 - 1.2 mg/dL   GFR calc non Af Amer 52 (L) >60 mL/min   GFR calc Af Amer >60 >60 mL/min   Anion gap 7 5 - 15    Comment: Performed at [facility], [address] [place], [place] [address]  CBC with Differential     Status: Abnormal   Collection Time: 11/23/18  8:38 AM  Result Value Ref Range   WBC 1.7 (L) 4.0 - 10.5 K/uL   RBC 2.49 (L) 4.22 - 5.81 MIL/uL   Hemoglobin 8.3 (L) 13.0 - 17.0 g/dL   HCT 26.1 (L) 39.0 - 52.0 %   MCV 104.8 (H) 80.0 - 100.0 fL   MCH 33.3 26.0 - 34.0 pg   MCHC 31.8 30.0 - 36.0 g/dL   RDW 17.0 (H) 11.5 - 15.5 %   Platelets 54 (L) 150 - 400 K/uL    Comment: Immature Platelet Fraction may be clinically indicated, consider ordering this additional test [id number]  nRBC 0.0 0.0 - 0.2 %   Neutrophils Relative % 66 %   Neutro Abs 1.2 (L) 1.7 - 7.7 K/uL   Lymphocytes Relative 18 %   Lymphs Abs 0.3 (L) 0.7 - 4.0 K/uL   Monocytes Relative 9 %   Monocytes Absolute 0.2 0.1 - 1.0  K/uL   Eosinophils Relative 5 %   Eosinophils Absolute 0.1 0.0 - 0.5 K/uL   Basophils Relative 1 %   Basophils Absolute 0.0 0.0 - 0.1 K/uL   Immature Granulocytes 1 %   Abs Immature Granulocytes 0.01 0.00 - 0.07 K/uL    Comment: Performed at [facility], [address] [place], [place] [address]    Assessment/Plan: 78 year old immunosuppressed male with a history of perirectal abscess.  I do recommend I&D here in the office.  Procedure discussed with the patient detail.  Risk benefit and possible complications.  Given that he is immunocompromise I will definitely agree with antibiotic management as well.  Procedure Note 1.  Incision and drainage of perirectal abscess  EBL: minimal  Complications: none  After informed consent was obtained  The patient was prepped and draped in the usual sterile fashion.  I specifically told him about his thrombocytopenia and the chances of bleeding.  I used lidocaine 1% with epinephrine and infiltrated the skin surrounding the abscess.  There was significant induration.  Using 11 blade knife elliptical incision was created, I drained 2 cc of pus, as of this was induration.  We broke down the loculations with hemostat.  We obtained hemostasis with pressure and cautery. Packing placed. No complications   Greater than 50% of the 45 minutes  visit was spent in counseling/coordination of care   Caroleen Hamman, MD [place] Surgeon

## 2018-11-26 ENCOUNTER — Other Ambulatory Visit: Payer: Self-pay | Admitting: Cardiology

## 2018-11-26 DIAGNOSIS — I2510 Atherosclerotic heart disease of native coronary artery without angina pectoris: Secondary | ICD-10-CM

## 2018-11-26 DIAGNOSIS — E785 Hyperlipidemia, unspecified: Secondary | ICD-10-CM

## 2018-11-26 DIAGNOSIS — I10 Essential (primary) hypertension: Secondary | ICD-10-CM

## 2018-11-26 DIAGNOSIS — E7849 Other hyperlipidemia: Secondary | ICD-10-CM

## 2018-11-27 LAB — ANAEROBIC AND AEROBIC CULTURE

## 2018-11-30 ENCOUNTER — Other Ambulatory Visit: Payer: Self-pay

## 2018-11-30 ENCOUNTER — Inpatient Hospital Stay: Payer: Medicare Other

## 2018-11-30 ENCOUNTER — Other Ambulatory Visit: Payer: Self-pay | Admitting: Oncology

## 2018-11-30 ENCOUNTER — Inpatient Hospital Stay (HOSPITAL_BASED_OUTPATIENT_CLINIC_OR_DEPARTMENT_OTHER): Payer: Medicare Other | Admitting: Oncology

## 2018-11-30 DIAGNOSIS — C9110 Chronic lymphocytic leukemia of B-cell type not having achieved remission: Secondary | ICD-10-CM | POA: Diagnosis not present

## 2018-11-30 DIAGNOSIS — I6523 Occlusion and stenosis of bilateral carotid arteries: Secondary | ICD-10-CM | POA: Diagnosis not present

## 2018-11-30 DIAGNOSIS — D701 Agranulocytosis secondary to cancer chemotherapy: Secondary | ICD-10-CM | POA: Diagnosis not present

## 2018-11-30 DIAGNOSIS — R7989 Other specified abnormal findings of blood chemistry: Secondary | ICD-10-CM | POA: Diagnosis not present

## 2018-11-30 DIAGNOSIS — R531 Weakness: Secondary | ICD-10-CM | POA: Diagnosis not present

## 2018-11-30 DIAGNOSIS — D693 Immune thrombocytopenic purpura: Secondary | ICD-10-CM | POA: Diagnosis not present

## 2018-11-30 DIAGNOSIS — D649 Anemia, unspecified: Secondary | ICD-10-CM | POA: Diagnosis not present

## 2018-11-30 LAB — COMPREHENSIVE METABOLIC PANEL
ALT: 17 U/L (ref 0–44)
AST: 22 U/L (ref 15–41)
Albumin: 3.9 g/dL (ref 3.5–5.0)
Alkaline Phosphatase: 89 U/L (ref 38–126)
Anion gap: 7 (ref 5–15)
BUN: 33 mg/dL — ABNORMAL HIGH (ref 8–23)
CO2: 25 mmol/L (ref 22–32)
Calcium: 9.4 mg/dL (ref 8.9–10.3)
Chloride: 106 mmol/L (ref 98–111)
Creatinine, Ser: 1.83 mg/dL — ABNORMAL HIGH (ref 0.61–1.24)
GFR calc Af Amer: 40 mL/min — ABNORMAL LOW (ref >60)
GFR calc non Af Amer: 35 mL/min — ABNORMAL LOW (ref >60)
Glucose, Bld: 152 mg/dL — ABNORMAL HIGH (ref 70–99)
Potassium: 5.2 mmol/L — ABNORMAL HIGH (ref 3.5–5.1)
Sodium: 138 mmol/L (ref 135–145)
Total Bilirubin: 0.5 mg/dL (ref 0.3–1.2)
Total Protein: 6.6 g/dL (ref 6.5–8.1)

## 2018-11-30 LAB — CBC WITH DIFFERENTIAL/PLATELET
Abs Immature Granulocytes: 0.1 10*3/uL — ABNORMAL HIGH (ref 0.00–0.07)
Basophils Absolute: 0 10*3/uL (ref 0.0–0.1)
Basophils Relative: 0 %
Eosinophils Absolute: 0.1 10*3/uL (ref 0.0–0.5)
Eosinophils Relative: 2 %
HCT: 24.2 % — ABNORMAL LOW (ref 39.0–52.0)
Hemoglobin: 7.6 g/dL — ABNORMAL LOW (ref 13.0–17.0)
Immature Granulocytes: 4 %
Lymphocytes Relative: 6 %
Lymphs Abs: 0.2 10*3/uL — ABNORMAL LOW (ref 0.7–4.0)
MCH: 33.5 pg (ref 26.0–34.0)
MCHC: 31.4 g/dL (ref 30.0–36.0)
MCV: 106.6 fL — ABNORMAL HIGH (ref 80.0–100.0)
Monocytes Absolute: 0.1 10*3/uL (ref 0.1–1.0)
Monocytes Relative: 6 %
Neutro Abs: 2.1 10*3/uL (ref 1.7–7.7)
Neutrophils Relative %: 82 %
Platelets: 39 10*3/uL — ABNORMAL LOW (ref 150–400)
RBC: 2.27 MIL/uL — ABNORMAL LOW (ref 4.22–5.81)
RDW: 17 % — ABNORMAL HIGH (ref 11.5–15.5)
WBC: 2.6 10*3/uL — ABNORMAL LOW (ref 4.0–10.5)
nRBC: 0 % (ref 0.0–0.2)

## 2018-11-30 MED ORDER — PREDNISONE 50 MG PO TABS: ORAL_TABLET | ORAL | 0 refills | Status: DC

## 2018-11-30 MED ORDER — SODIUM CHLORIDE 0.9 % IV SOLN
INTRAVENOUS | Status: DC
  Administered 2018-11-30: 10:00:00 via INTRAVENOUS
  Filled 2018-11-30 (×2): qty 250

## 2018-11-30 NOTE — Progress Notes (Signed)
[place]  [phone] [phone]  ID: [name] OB: 06-27-1940  MR#: [id number]  BTC#:[id number]  Patient Care Team: [name], MD as PCP - General (Family Medicine) Lloyd Huger, MD as Consulting Physician (Oncology) Clent Jacks, MD as Consulting Physician (Ophthalmology) Dorothy Spark, MD as Consulting Physician (Cardiology) Madelon Lips, MD as Consulting Physician (Nephrology)  CHIEF COMPLAINT: 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   INTERVAL HISTORY: Patient presents to clinic today for further evaluation and reconsideration of cycle 3-day 1 of Treanda and Rituxan.  Last week, treatment held due to thrombocytopenia and painful lesion on his bottom/gluteal fold.  He was evaluated by Dr. Harlow Ohms office and an I&D was performed.  He started on Bactrim.   Today, patient states his bottom is still very tender.  He can stand but has trouble sitting for long periods of time.  He denies any fevers or recent illnesses.  States last Monday after his I&D he had trouble stopping the blood flow from his wound for about 30 minutes.  He also had a nosebleed this past weekend that took several minutes to stop.  He continues to feel weak and fatigued.  His appetite is stable.  He does note improvement of 2 new lesions on left and right arm.  REVIEW OF SYSTEMS:   Review of Systems  Constitutional: Positive for malaise/fatigue.  Skin:       Improving skin lesions; on left and right arm.  Sacral lesion continues to be tender and painful.  Neurological: Positive for weakness.   As per HPI. Otherwise, a complete review of systems is negative.  PAST MEDICAL HISTORY: Past Medical History:  Diagnosis Date   Acquired hand deformity 1962   hand saw accident at work   Anemia 10/11/2011   Arthritis    Bradycardia 10/10/2011   CAD (coronary artery disease) 10/10/2011   MI s/p PTCA (Dx-OM2 proximal concentric stenosis)   CKD  (chronic kidney disease) stage 3, GFR 30-59 ml/min (HCC)    [name]   CLL (chronic lymphocytic leukemia) ([place])    Colon polyps    Generalized headaches    frequent   GI bleed 07/08/2017   Glaucoma    s/p laser surgery   HLD (hyperlipidemia)    Hypertension    Hypothyroidism 10/10/2011   Thrombocytopenia ([place]) 10/11/2011   Type 2 diabetes with nephropathy ([place])    DM refresher course [facility] (04/2013)    PAST SURGICAL HISTORY: Past Surgical History:  Procedure Laterality Date   BACK SURGERY     cervical neck   CATARACT EXTRACTION, BILATERAL Bilateral 2017   COLONOSCOPY  11/2008   1 polyp, diverticulosis, rec rpt 5 yrs (Dr. Oletta Lamas, [name])   COLONOSCOPY  06/2014   hyperplastic polyp, rpt 5 yrs ([place])   COLONOSCOPY WITH PROPOFOL N/A 11/17/2017   TA, HP, (Vanga, Tally Due, MD)   ESOPHAGOGASTRODUODENOSCOPY (EGD) WITH PROPOFOL N/A 11/17/2017   healing erosive gastritis, intestinal metaplasia, neg H pylori ([place], Tally Due, MD)   EYE SURGERY  2012   laser surgery for glaucoma   PTCA  1994, 1995   US ECHOCARDIOGRAPHY  10/2013   inferior wall hypokinesis, mild LVH, EF 50-55%, mild MR and LA dilation    FAMILY HISTORY: Family History  Problem Relation Age of Onset   Diabetes Sister    Stomach cancer Sister 1   Pneumonia Father        caused death   Other Brother  no communication with brother so unsure of any health conditions   Coronary artery disease Son 76       5v CABG and stents   Hyperlipidemia Sister    Stroke Neg Hx    Heart attack Neg Hx     ADVANCED DIRECTIVES (Y/N):  N  HEALTH MAINTENANCE: Social History   Tobacco Use   Smoking status: Former Smoker    Quit date: 03/04/1978    Years since quitting: 40.7   Smokeless tobacco: Never Used  Substance Use Topics   Alcohol use: No   Drug use: No     Colonoscopy:  PAP:  Bone density:  Lipid panel:  No Known Allergies  Current Outpatient Medications  Medication Sig  Dispense Refill   acetaminophen (TYLENOL) 650 MG CR tablet Take 650 mg by mouth every 8 (eight) hours as needed for pain.     atorvastatin (LIPITOR) 80 MG tablet Take 80 mg by mouth daily.     carvedilol (COREG) 6.25 MG tablet Take 1 tablet (6.25 mg total) by mouth 2 (two) times daily with a meal. Please make overdue appt with Dr. Meda Coffee before anymore refills. 3rd and Final Attempt 30 tablet 0   clotrimazole (LOTRIMIN) 1 % cream Apply 1 application topically 2 (two) times daily. 80 g 0   docusate sodium (COLACE) 100 MG capsule Take 1 capsule (100 mg total) by mouth 2 (two) times daily. 10 capsule 0   doxycycline (VIBRA-TABS) 100 MG tablet Take 1 tablet (100 mg total) by mouth 2 (two) times daily. 14 tablet 0   glucose blood (ONE TOUCH ULTRA TEST) test strip 1 each by Other route 4 (four) times daily. and as needed to check sugars Dx code:  E11.21 400 each 3   insulin aspart (NOVOLOG FLEXPEN) 100 UNIT/ML FlexPen Take 5 units with meals, take 10 units if mealtime sugar >250 15 mL 11   Insulin Detemir (LEVEMIR FLEXTOUCH) 100 UNIT/ML Pen INJECT 25 UNITS SUBCUTANEOUSLY ONCE DAILY AT  10PM 15 pen 3   levothyroxine (SYNTHROID) 88 MCG tablet Take 1 tablet (88 mcg total) by mouth daily. 90 tablet 1   nitroGLYCERIN (NITROSTAT) 0.4 MG SL tablet Place 1 tablet (0.4 mg total) under the tongue every 5 (five) minutes as needed for chest pain ((MAX of 3 doses)). 25 tablet 0   ondansetron (ZOFRAN) 8 MG tablet Take 1 tablet (8 mg total) by mouth 2 (two) times daily as needed for refractory nausea / vomiting. 30 tablet 2   pantoprazole (PROTONIX) 40 MG tablet Take 1 tablet (40 mg total) by mouth daily. 90 tablet 0   prochlorperazine (COMPAZINE) 10 MG tablet Take 1 tablet (10 mg total) by mouth every 6 (six) hours as needed (Nausea or vomiting). 60 tablet 2   sulfamethoxazole-trimethoprim (BACTRIM DS) 800-160 MG tablet Take 1 tablet by mouth 2 (two) times daily. 20 tablet 0   traZODone (DESYREL) 100 MG  tablet Take 1 tablet (100 mg total) by mouth at bedtime as needed for sleep. 90 tablet 1   No current facility-administered medications for this visit.     OBJECTIVE: There were no vitals filed for this visit.   There is no height or weight on file to calculate BMI.    ECOG FS:0 - Asymptomatic   Physical Exam Cardiovascular:     Rate and Rhythm: Normal rate and regular rhythm.     Pulses: Normal pulses.     Heart sounds: Normal heart sounds.  Pulmonary:     Effort: Pulmonary  effort is normal.     Breath sounds: Normal breath sounds.  Abdominal:     Palpations: Abdomen is soft.  Skin:    Findings: Lesion present.     Comments: 2 areas of denuded skin to L lateral distal upper arm proximal to elbow- noted improvement. L inner upper buttock- s/p I & D.  Bandage on.  Recently changed.  Scheduled to return to surgeon's office on next Tuesday for reevaluation.  Neurological:     Mental Status: He is alert.       Lab Results  Component Value Date   NA 143 11/23/2018   K 4.6 11/23/2018   CL 107 11/23/2018   CO2 29 11/23/2018   GLUCOSE 56 (L) 11/23/2018   BUN 33 (H) 11/23/2018   CREATININE 1.31 (H) 11/23/2018   CALCIUM 9.5 11/23/2018   PROT 6.4 (L) 11/23/2018   ALBUMIN 3.6 11/23/2018   AST 18 11/23/2018   ALT 13 11/23/2018   ALKPHOS 101 11/23/2018   BILITOT 0.7 11/23/2018   GFRNONAA 52 (L) 11/23/2018   GFRAA >60 11/23/2018    Lab Results  Component Value Date   WBC 1.7 (L) 11/23/2018   NEUTROABS 1.2 (L) 11/23/2018   HGB 8.3 (L) 11/23/2018   HCT 26.1 (L) 11/23/2018   MCV 104.8 (H) 11/23/2018   PLT 54 (L) 11/23/2018     STUDIES: Dg Foot Complete Left  Result Date: 11/17/2018 CLINICAL DATA:  Left navicular pain. No known trauma. EXAM: LEFT FOOT - COMPLETE 3+ VIEW COMPARISON:  None. FINDINGS: There is no fracture or dislocation. Minimal arthritic change at the second MTP joint. Minimal dorsal spurring on the proximal navicular with slight chondrocalcinosis between the  navicular and the cuneiforms only seen on the lateral view. The navicular otherwise appears normal. No other abnormalities. IMPRESSION: Minimal arthritic changes as described. Electronically Signed   By: Lorriane Shire M.D.   On: 11/17/2018 16:38    ASSESSMENT: CLL with ATM (11q-) mutation and 13q-, anemia, ITP.  PLAN:    1. CLL: Labs today reveal significant thrombocytopenia.  Platelets 39,000 today.  Continue to hold treatment d/t thrombocytopenia.  Return to clinic in 1 week for labs, assessment and reconsideration of cycle 3-day 1 Rituxan and Treanda. 2.  Thrombocytopenia: Decreased.  Hold tx as above.  3.  Anemia: Decreased and is 7.6 today.  We will schedule him to return to clinic on Thursday for lab work and possible blood transfusion.. 4.  Reaction to Rituxan: Rate-based.  Patient will require premedications for any further infusions with Rituxan.  These have been entered into his treatment plan. 5.  Cellulitis to right intergluteal cleft: Status post I&D with Dr. Perrin Maltese.  He is scheduled for follow-up next week.  Continue Bactrim BID X 10 days.  6.  Nosebleeds: Notes a nosebleed over the weekend.  Likely due to thrombocytopenia.  Hold treatment as above. 7.  Neutropenia: Improved.  Requires Neulasta with treatments. 8.  Elevated creatinine: Increased from 1.3-1.83 today.  Will give 1 L NaCl today.   Patient expressed understanding and was in agreement with this plan. He also understands that He can call clinic at any time with any questions, concerns, or complaints.   Greater than 50% was spent in counseling and coordination of care with this patient including but not limited to discussion of the relevant topics above (See A&P) including, but not limited to diagnosis and management of acute and chronic medical conditions.   Jacquelin Hawking, NP   11/30/2018 8:51 AM

## 2018-11-30 NOTE — Progress Notes (Signed)
Consulted with Dr. [name] and it was recommended to begin 50 mg prednisone daily for 7 days due to persistent thrombocytopenia.  He will return to clinic on Thursday for labs and possible 1 unit packed red blood cells.  Orders are placed.  Patient called and VM left for return phone call.   Faythe Casa, NP 11/30/2018 4:01 PM

## 2018-11-30 NOTE — Progress Notes (Signed)
Patient asking about information regarding port a cath placement. Provided information, both written and verbal. Patient says he will think about it.

## 2018-12-01 ENCOUNTER — Inpatient Hospital Stay: Payer: Medicare Other

## 2018-12-03 ENCOUNTER — Encounter: Payer: Self-pay | Admitting: Oncology

## 2018-12-03 ENCOUNTER — Inpatient Hospital Stay: Payer: Medicare Other | Attending: Oncology

## 2018-12-03 ENCOUNTER — Other Ambulatory Visit: Payer: Self-pay

## 2018-12-03 ENCOUNTER — Inpatient Hospital Stay: Payer: Medicare Other

## 2018-12-03 DIAGNOSIS — Z794 Long term (current) use of insulin: Secondary | ICD-10-CM | POA: Diagnosis not present

## 2018-12-03 DIAGNOSIS — E1122 Type 2 diabetes mellitus with diabetic chronic kidney disease: Secondary | ICD-10-CM | POA: Insufficient documentation

## 2018-12-03 DIAGNOSIS — N189 Chronic kidney disease, unspecified: Secondary | ICD-10-CM | POA: Insufficient documentation

## 2018-12-03 DIAGNOSIS — Z5112 Encounter for antineoplastic immunotherapy: Secondary | ICD-10-CM | POA: Insufficient documentation

## 2018-12-03 DIAGNOSIS — R7989 Other specified abnormal findings of blood chemistry: Secondary | ICD-10-CM | POA: Insufficient documentation

## 2018-12-03 DIAGNOSIS — R5383 Other fatigue: Secondary | ICD-10-CM | POA: Insufficient documentation

## 2018-12-03 DIAGNOSIS — C9110 Chronic lymphocytic leukemia of B-cell type not having achieved remission: Secondary | ICD-10-CM | POA: Diagnosis not present

## 2018-12-03 DIAGNOSIS — R531 Weakness: Secondary | ICD-10-CM | POA: Insufficient documentation

## 2018-12-03 DIAGNOSIS — E785 Hyperlipidemia, unspecified: Secondary | ICD-10-CM | POA: Diagnosis not present

## 2018-12-03 DIAGNOSIS — Z5111 Encounter for antineoplastic chemotherapy: Secondary | ICD-10-CM | POA: Diagnosis not present

## 2018-12-03 DIAGNOSIS — Z452 Encounter for adjustment and management of vascular access device: Secondary | ICD-10-CM | POA: Diagnosis not present

## 2018-12-03 DIAGNOSIS — Z79899 Other long term (current) drug therapy: Secondary | ICD-10-CM | POA: Diagnosis not present

## 2018-12-03 DIAGNOSIS — D693 Immune thrombocytopenic purpura: Secondary | ICD-10-CM | POA: Insufficient documentation

## 2018-12-03 DIAGNOSIS — D649 Anemia, unspecified: Secondary | ICD-10-CM

## 2018-12-03 DIAGNOSIS — I129 Hypertensive chronic kidney disease with stage 1 through stage 4 chronic kidney disease, or unspecified chronic kidney disease: Secondary | ICD-10-CM | POA: Insufficient documentation

## 2018-12-03 DIAGNOSIS — Z9889 Other specified postprocedural states: Secondary | ICD-10-CM | POA: Diagnosis not present

## 2018-12-03 DIAGNOSIS — Z5189 Encounter for other specified aftercare: Secondary | ICD-10-CM | POA: Diagnosis not present

## 2018-12-03 LAB — COMPREHENSIVE METABOLIC PANEL
ALT: 17 U/L (ref 0–44)
AST: 24 U/L (ref 15–41)
Albumin: 4.1 g/dL (ref 3.5–5.0)
Alkaline Phosphatase: 84 U/L (ref 38–126)
Anion gap: 8 (ref 5–15)
BUN: 38 mg/dL — ABNORMAL HIGH (ref 8–23)
CO2: 23 mmol/L (ref 22–32)
Calcium: 9.6 mg/dL (ref 8.9–10.3)
Chloride: 109 mmol/L (ref 98–111)
Creatinine, Ser: 1.85 mg/dL — ABNORMAL HIGH (ref 0.61–1.24)
GFR calc Af Amer: 40 mL/min — ABNORMAL LOW (ref >60)
GFR calc non Af Amer: 34 mL/min — ABNORMAL LOW (ref >60)
Glucose, Bld: 131 mg/dL — ABNORMAL HIGH (ref 70–99)
Potassium: 5.4 mmol/L — ABNORMAL HIGH (ref 3.5–5.1)
Sodium: 140 mmol/L (ref 135–145)
Total Bilirubin: 0.5 mg/dL (ref 0.3–1.2)
Total Protein: 6.3 g/dL — ABNORMAL LOW (ref 6.5–8.1)

## 2018-12-03 LAB — CBC WITH DIFFERENTIAL/PLATELET
Abs Immature Granulocytes: 0.14 10*3/uL — ABNORMAL HIGH (ref 0.00–0.07)
Basophils Absolute: 0 10*3/uL (ref 0.0–0.1)
Basophils Relative: 1 %
Eosinophils Absolute: 0 10*3/uL (ref 0.0–0.5)
Eosinophils Relative: 1 %
HCT: 22.5 % — ABNORMAL LOW (ref 39.0–52.0)
Hemoglobin: 7.1 g/dL — ABNORMAL LOW (ref 13.0–17.0)
Immature Granulocytes: 7 %
Lymphocytes Relative: 10 %
Lymphs Abs: 0.2 10*3/uL — ABNORMAL LOW (ref 0.7–4.0)
MCH: 34 pg (ref 26.0–34.0)
MCHC: 31.6 g/dL (ref 30.0–36.0)
MCV: 107.7 fL — ABNORMAL HIGH (ref 80.0–100.0)
Monocytes Absolute: 0.1 10*3/uL (ref 0.1–1.0)
Monocytes Relative: 4 %
Neutro Abs: 1.6 10*3/uL — ABNORMAL LOW (ref 1.7–7.7)
Neutrophils Relative %: 77 %
Platelets: 40 10*3/uL — ABNORMAL LOW (ref 150–400)
RBC: 2.09 MIL/uL — ABNORMAL LOW (ref 4.22–5.81)
RDW: 17.5 % — ABNORMAL HIGH (ref 11.5–15.5)
Smear Review: NORMAL
WBC: 2 10*3/uL — ABNORMAL LOW (ref 4.0–10.5)
nRBC: 0 % (ref 0.0–0.2)

## 2018-12-03 LAB — PREPARE RBC (CROSSMATCH)

## 2018-12-03 MED ORDER — SODIUM CHLORIDE 0.9% IV SOLUTION
250.0000 mL | Freq: Once | INTRAVENOUS | Status: AC
  Administered 2018-12-03: 09:00:00 250 mL via INTRAVENOUS
  Filled 2018-12-03: qty 250

## 2018-12-03 MED ORDER — ACETAMINOPHEN 325 MG PO TABS
650.0000 mg | ORAL_TABLET | Freq: Once | ORAL | Status: AC
  Administered 2018-12-03: 650 mg via ORAL
  Filled 2018-12-03: qty 2

## 2018-12-03 MED ORDER — DIPHENHYDRAMINE HCL 25 MG PO CAPS
25.0000 mg | ORAL_CAPSULE | Freq: Once | ORAL | Status: AC
  Administered 2018-12-03: 25 mg via ORAL
  Filled 2018-12-03: qty 1

## 2018-12-04 LAB — TYPE AND SCREEN
ABO/RH(D): A POS
Antibody Screen: NEGATIVE
Unit division: 0

## 2018-12-04 LAB — BPAM RBC
Blood Product Expiration Date: 202010222359
ISSUE DATE / TIME: 202010011032
Unit Type and Rh: 6200

## 2018-12-04 NOTE — Progress Notes (Signed)
No answer, message left on VM

## 2018-12-07 ENCOUNTER — Inpatient Hospital Stay (HOSPITAL_BASED_OUTPATIENT_CLINIC_OR_DEPARTMENT_OTHER): Payer: Medicare Other | Admitting: Nurse Practitioner

## 2018-12-07 ENCOUNTER — Other Ambulatory Visit: Payer: Self-pay

## 2018-12-07 ENCOUNTER — Other Ambulatory Visit: Payer: Self-pay | Admitting: *Deleted

## 2018-12-07 ENCOUNTER — Telehealth (INDEPENDENT_AMBULATORY_CARE_PROVIDER_SITE_OTHER): Payer: Self-pay

## 2018-12-07 ENCOUNTER — Encounter: Payer: Self-pay | Admitting: Oncology

## 2018-12-07 ENCOUNTER — Telehealth: Payer: Self-pay | Admitting: *Deleted

## 2018-12-07 ENCOUNTER — Ambulatory Visit: Payer: Medicare Other | Admitting: Physician Assistant

## 2018-12-07 ENCOUNTER — Inpatient Hospital Stay: Payer: Medicare Other

## 2018-12-07 VITALS — BP 141/76 | HR 59 | Temp 95.8°F | Resp 16 | Wt 150.5 lb

## 2018-12-07 DIAGNOSIS — C9110 Chronic lymphocytic leukemia of B-cell type not having achieved remission: Secondary | ICD-10-CM

## 2018-12-07 DIAGNOSIS — I6523 Occlusion and stenosis of bilateral carotid arteries: Secondary | ICD-10-CM

## 2018-12-07 DIAGNOSIS — Z5112 Encounter for antineoplastic immunotherapy: Secondary | ICD-10-CM | POA: Diagnosis not present

## 2018-12-07 DIAGNOSIS — Z5111 Encounter for antineoplastic chemotherapy: Secondary | ICD-10-CM | POA: Diagnosis not present

## 2018-12-07 DIAGNOSIS — D693 Immune thrombocytopenic purpura: Secondary | ICD-10-CM | POA: Diagnosis not present

## 2018-12-07 DIAGNOSIS — D708 Other neutropenia: Secondary | ICD-10-CM | POA: Diagnosis not present

## 2018-12-07 DIAGNOSIS — D649 Anemia, unspecified: Secondary | ICD-10-CM

## 2018-12-07 DIAGNOSIS — D696 Thrombocytopenia, unspecified: Secondary | ICD-10-CM

## 2018-12-07 DIAGNOSIS — Z5189 Encounter for other specified aftercare: Secondary | ICD-10-CM | POA: Diagnosis not present

## 2018-12-07 DIAGNOSIS — Z452 Encounter for adjustment and management of vascular access device: Secondary | ICD-10-CM | POA: Diagnosis not present

## 2018-12-07 LAB — CBC WITH DIFFERENTIAL/PLATELET
Abs Immature Granulocytes: 0.09 10*3/uL — ABNORMAL HIGH (ref 0.00–0.07)
Basophils Absolute: 0 10*3/uL (ref 0.0–0.1)
Basophils Relative: 1 %
Eosinophils Absolute: 0 10*3/uL (ref 0.0–0.5)
Eosinophils Relative: 0 %
HCT: 28.4 % — ABNORMAL LOW (ref 39.0–52.0)
Hemoglobin: 9.3 g/dL — ABNORMAL LOW (ref 13.0–17.0)
Immature Granulocytes: 7 %
Lymphocytes Relative: 21 %
Lymphs Abs: 0.3 10*3/uL — ABNORMAL LOW (ref 0.7–4.0)
MCH: 33.9 pg (ref 26.0–34.0)
MCHC: 32.7 g/dL (ref 30.0–36.0)
MCV: 103.6 fL — ABNORMAL HIGH (ref 80.0–100.0)
Monocytes Absolute: 0.1 10*3/uL (ref 0.1–1.0)
Monocytes Relative: 10 %
Neutro Abs: 0.8 10*3/uL — ABNORMAL LOW (ref 1.7–7.7)
Neutrophils Relative %: 61 %
Platelets: 60 10*3/uL — ABNORMAL LOW (ref 150–400)
RBC: 2.74 MIL/uL — ABNORMAL LOW (ref 4.22–5.81)
RDW: 18.8 % — ABNORMAL HIGH (ref 11.5–15.5)
Smear Review: DECREASED
WBC: 1.2 10*3/uL — CL (ref 4.0–10.5)
nRBC: 0 % (ref 0.0–0.2)

## 2018-12-07 LAB — COMPREHENSIVE METABOLIC PANEL
ALT: 19 U/L (ref 0–44)
AST: 17 U/L (ref 15–41)
Albumin: 4.2 g/dL (ref 3.5–5.0)
Alkaline Phosphatase: 76 U/L (ref 38–126)
Anion gap: 9 (ref 5–15)
BUN: 40 mg/dL — ABNORMAL HIGH (ref 8–23)
CO2: 27 mmol/L (ref 22–32)
Calcium: 9.5 mg/dL (ref 8.9–10.3)
Chloride: 107 mmol/L (ref 98–111)
Creatinine, Ser: 1.33 mg/dL — ABNORMAL HIGH (ref 0.61–1.24)
GFR calc Af Amer: 59 mL/min — ABNORMAL LOW (ref >60)
GFR calc non Af Amer: 51 mL/min — ABNORMAL LOW (ref >60)
Glucose, Bld: 98 mg/dL (ref 70–99)
Potassium: 4.2 mmol/L (ref 3.5–5.1)
Sodium: 143 mmol/L (ref 135–145)
Total Bilirubin: 0.7 mg/dL (ref 0.3–1.2)
Total Protein: 6.8 g/dL (ref 6.5–8.1)

## 2018-12-07 MED ORDER — PREDNISONE 5 MG PO TABS: ORAL_TABLET | ORAL | 0 refills | Status: AC

## 2018-12-07 NOTE — Telephone Encounter (Signed)
patient calling the cancer center. concerned about the nasal swab for covid-19 due to a nose bleed he had today. He would like the test to be performed in a different manner. Nose bleeding lasted 1.5 hours. Nose bleed no stopped. Lauren, NP made aware. She will asked Dr. [name] about referral to ENT.   I spoke preadmit and they do not have any alt. way to swab him for covid. she said the patient already called them as well. I asked the patient nursing team to be very gentle when swabbing the patient's nose. pt is aware to remind the nurses as well. Also, pt aware that if the nose bleeds start back again, then he may have to go to the ER for packing. He stated that he would like to avoid the ER as much as possible.  He gave verbal understanding.  Pt has not yet picked up the prednisone script but states that he will as soon as possible.

## 2018-12-07 NOTE — Telephone Encounter (Signed)
Patient returned my call and we discussed his Covid testing at the [facility] on 12/08/2018 and his procedure on 12/09/2018 with Dr. Delana Meyer with a 11:00 am arrival time to the [facility].

## 2018-12-07 NOTE — Telephone Encounter (Signed)
From the Cancer center:  Scott [name],  Im so sorry I need to cancel this request... I didn't realize that he has a Psychologist, sport and exercise already that we will need to use for the port.    Im so sorry! Scott [name] are just always my immediate "go to"     The patient just called and was given the information. So I need to cancel this procedure correct ? He may be confusing [place] with someone else ?    Yes it needs to be cancelled, I will make sure that him & son are clear on everything.  Thank you

## 2018-12-07 NOTE — Telephone Encounter (Signed)
Hi Scott [name],   Patient needs a port placement asap, he decided Friday that he wanted a port and now is refusing todays treatment without the port. Lol ...   Thank you so much for your help  [name],  I have him scheduled with Dr. Delana Meyer for Wednesday 12/09/2018 with a 11:00 am arrival time to the [facility]. Patient will have to do Covid testing on 12/08/2018 before 11:00 am at the [facility]. He needs to be NPO, half of his insulin and one person can be with him. I attempted to contact him but a message was left for return call.  Thank you.

## 2018-12-07 NOTE — Progress Notes (Signed)
[place]  [phone] [phone]) [phone]  ID: [name] OB: 1940/06/09  MR#: [id number]  JQB#:[id number]  Patient Care Team: [name], MD as PCP - General (Family Medicine) Lloyd Huger, MD as Consulting Physician (Oncology) Clent Jacks, MD as Consulting Physician (Ophthalmology) Dorothy Spark, MD as Consulting Physician (Cardiology) Madelon Lips, MD as Consulting Physician (Nephrology)  CHIEF COMPLAINT: CLL with ATM (11q-) mutation and 13q-, ITP   INTERVAL HISTORY: Scott [name], 78 year old male, returns to clinic for consideration of cycle 3, day 1 of bendamustine and rituxan. Previously, treatment has been held due to ongoing thrombocytopenia and wound on gluteal fold, s/p I&D and bactrim with Dr. Dahlia Byes. Last week platelets were 40 and he was started on prednisone for seven days.   Today, he says that he continues to heal from I&D but area is less tender. He is scheduled for follow up with surgery tomorrow.  He says it is becoming increasingly difficult to get his blood in place IVs and he would like to have port placed.  He says it is too painful to be stuck repeatedly.  He wishes to hold treatment today due to discomfort with IV sticks and proceed with port placement.  He says he has not had any more nosebleeds.  No blood in his stool.  Continues to feel weak and fatigued.  Appetite is stable.  No recent fevers or illness. Denies nausea, vomiting, diarrhea, constipation.  Denies chest pain or palpitations.  Denies urinary complaints.  Denies other specific complaints.  REVIEW OF SYSTEMS:   Review of Systems  Constitutional: Positive for malaise/fatigue. Negative for chills, fever and weight loss.  Eyes: Negative for blurred vision, pain and redness.  Respiratory: Negative for cough, hemoptysis, sputum production and shortness of breath.   Cardiovascular: Negative for chest pain, palpitations, orthopnea, claudication and  leg swelling.  Gastrointestinal: Negative for abdominal pain, constipation, diarrhea, heartburn, nausea and vomiting.  Genitourinary: Negative for dysuria, frequency and urgency.  Musculoskeletal: Negative for back pain, falls, joint pain, myalgias and neck pain.  Skin:       Sacral lesion-s/p I&D- improving.   Neurological: Positive for weakness. Negative for dizziness and headaches.  Endo/Heme/Allergies: Negative for environmental allergies. Bruises/bleeds easily.  Psychiatric/Behavioral: Negative for depression. The patient is nervous/anxious. The patient does not have insomnia.     PAST MEDICAL HISTORY: Past Medical History:  Diagnosis Date  . Acquired hand deformity 1962   hand saw accident at work  . Anemia 10/11/2011  . Arthritis   . Bradycardia 10/10/2011  . CAD (coronary artery disease) 10/10/2011   MI s/p PTCA (Dx-OM2 proximal concentric stenosis)  . CKD (chronic kidney disease) stage 3, GFR 30-59 ml/min    [name]  . CLL (chronic lymphocytic leukemia) ([place])   . Colon polyps   . Generalized headaches    frequent  . GI bleed 07/08/2017  . Glaucoma    s/p laser surgery  . HLD (hyperlipidemia)   . Hypertension   . Hypothyroidism 10/10/2011  . Thrombocytopenia ([place]) 10/11/2011  . Type 2 diabetes with nephropathy [facility])    DM refresher course [facility] (04/2013)    PAST SURGICAL HISTORY: Past Surgical History:  Procedure Laterality Date  . BACK SURGERY     cervical neck  . CATARACT EXTRACTION, BILATERAL Bilateral 2017  . COLONOSCOPY  11/2008   1 polyp, diverticulosis, rec rpt 5 yrs (Dr. Oletta Lamas, [name])  . COLONOSCOPY  06/2014   hyperplastic polyp, rpt 5 yrs ([place])  .  COLONOSCOPY WITH PROPOFOL N/A 11/17/2017   TA, HP, (Vanga, Tally Due, MD)  . ESOPHAGOGASTRODUODENOSCOPY (EGD) WITH PROPOFOL N/A 11/17/2017   healing erosive gastritis, intestinal metaplasia, neg H pylori (Vanga, Tally Due, MD)  . EYE SURGERY  2012   laser surgery for glaucoma  . PTCA  1994, 1995  . US  ECHOCARDIOGRAPHY  10/2013   inferior wall hypokinesis, mild LVH, EF 50-55%, mild MR and LA dilation    FAMILY HISTORY: Family History  Problem Relation Age of Onset  . Diabetes Sister   . Stomach cancer Sister 13  . Pneumonia Father        caused death  . Other Brother        no communication with brother so unsure of any health conditions  . Coronary artery disease Son 35       5v CABG and stents  . Hyperlipidemia Sister   . Stroke Neg Hx   . Heart attack Neg Hx     ADVANCED DIRECTIVES (Y/N):  N  HEALTH MAINTENANCE: Social History   Tobacco Use  . Smoking status: Former Smoker    Quit date: 03/04/1978    Years since quitting: 40.7  . Smokeless tobacco: Never Used  Substance Use Topics  . Alcohol use: No  . Drug use: No    Colonoscopy:  Bone density:  Lipid panel:  No Known Allergies  Current Outpatient Medications  Medication Sig Dispense Refill  . acetaminophen (TYLENOL) 650 MG CR tablet Take 650 mg by mouth every 8 (eight) hours as needed for pain.    Marland Kitchen atorvastatin (LIPITOR) 80 MG tablet Take 80 mg by mouth daily.    . carvedilol (COREG) 6.25 MG tablet Take 1 tablet (6.25 mg total) by mouth 2 (two) times daily with a meal. Please make overdue appt with Dr. Meda Coffee before anymore refills. 3rd and Final Attempt 30 tablet 0  . clotrimazole (LOTRIMIN) 1 % cream Apply 1 application topically 2 (two) times daily. 80 g 0  . docusate sodium (COLACE) 100 MG capsule Take 1 capsule (100 mg total) by mouth 2 (two) times daily. 10 capsule 0  . doxycycline (VIBRA-TABS) 100 MG tablet Take 1 tablet (100 mg total) by mouth 2 (two) times daily. 14 tablet 0  . glucose blood (ONE TOUCH ULTRA TEST) test strip 1 each by Other route 4 (four) times daily. and as needed to check sugars Dx code:  E11.21 400 each 3  . insulin aspart (NOVOLOG FLEXPEN) 100 UNIT/ML FlexPen Take 5 units with meals, take 10 units if mealtime sugar >250 15 mL 11  . Insulin Detemir (LEVEMIR FLEXTOUCH) 100 UNIT/ML Pen  INJECT 25 UNITS SUBCUTANEOUSLY ONCE DAILY AT  10PM 15 pen 3  . levothyroxine (SYNTHROID) 88 MCG tablet Take 1 tablet (88 mcg total) by mouth daily. 90 tablet 1  . nitroGLYCERIN (NITROSTAT) 0.4 MG SL tablet Place 1 tablet (0.4 mg total) under the tongue every 5 (five) minutes as needed for chest pain ((MAX of 3 doses)). 25 tablet 0  . ondansetron (ZOFRAN) 8 MG tablet Take 1 tablet (8 mg total) by mouth 2 (two) times daily as needed for refractory nausea / vomiting. 30 tablet 2  . pantoprazole (PROTONIX) 40 MG tablet Take 1 tablet (40 mg total) by mouth daily. 90 tablet 0  . predniSONE (DELTASONE) 50 MG tablet Take one tablet (50 mg) daily X 7 days. (Patient taking differently: 25 mg. Take one tablet (50 mg) daily X 7 days.) 7 tablet 0  . prochlorperazine (  COMPAZINE) 10 MG tablet Take 1 tablet (10 mg total) by mouth every 6 (six) hours as needed (Nausea or vomiting). 60 tablet 2  . sulfamethoxazole-trimethoprim (BACTRIM DS) 800-160 MG tablet Take 1 tablet by mouth 2 (two) times daily. 20 tablet 0  . traZODone (DESYREL) 100 MG tablet Take 1 tablet (100 mg total) by mouth at bedtime as needed for sleep. 90 tablet 1   No current facility-administered medications for this visit.     OBJECTIVE: Vitals:   12/07/18 0847  BP: (!) 141/76  Pulse: (!) 59  Resp: 16  Temp: (!) 95.8 F (35.4 C)     Body mass index is 20.41 kg/m.      ECOG FS:0 - Asymptomatic   Physical Exam Constitutional:      General: He is not in acute distress.    Comments: Unaccompanied. Wearing mask.   HENT:     Head: Normocephalic and atraumatic.     Right Ear: External ear normal.     Left Ear: External ear normal.     Nose: No congestion.  Eyes:     General: No scleral icterus.    Conjunctiva/sclera: Conjunctivae normal.  Cardiovascular:     Rate and Rhythm: Normal rate and regular rhythm.     Pulses: Normal pulses.     Heart sounds: Normal heart sounds.  Pulmonary:     Effort: Pulmonary effort is normal.      Breath sounds: Normal breath sounds.  Abdominal:     General: There is no distension.     Palpations: Abdomen is soft.  Musculoskeletal:        General: No deformity.     Right lower leg: No edema.     Left lower leg: No edema.  Lymphadenopathy:     Cervical: No cervical adenopathy.  Skin:    Findings: Lesion present.     Comments: L lateral upper arm- 2 areas of denuded skin- improved; nearly healed.   Known left inner buttock wound s/p I & D. Bandage in place, clean & dry.   Neurological:     Mental Status: He is alert and oriented to person, place, and time.     Motor: No weakness.  Psychiatric:        Mood and Affect: Mood is anxious.        Speech: Speech normal.        Behavior: Behavior normal.       Lab Results  Component Value Date   NA 140 12/03/2018   K 5.4 (H) 12/03/2018   CL 109 12/03/2018   CO2 23 12/03/2018   GLUCOSE 131 (H) 12/03/2018   BUN 38 (H) 12/03/2018   CREATININE 1.85 (H) 12/03/2018   CALCIUM 9.6 12/03/2018   PROT 6.3 (L) 12/03/2018   ALBUMIN 4.1 12/03/2018   AST 24 12/03/2018   ALT 17 12/03/2018   ALKPHOS 84 12/03/2018   BILITOT 0.5 12/03/2018   GFRNONAA 34 (L) 12/03/2018   GFRAA 40 (L) 12/03/2018    Lab Results  Component Value Date   WBC 1.2 (LL) 12/07/2018   NEUTROABS PENDING 12/07/2018   HGB 9.3 (L) 12/07/2018   HCT 28.4 (L) 12/07/2018   MCV 103.6 (H) 12/07/2018   PLT 60 (L) 12/07/2018     STUDIES: Dg Foot Complete Left  Result Date: 11/17/2018 CLINICAL DATA:  Left navicular pain. No known trauma. EXAM: LEFT FOOT - COMPLETE 3+ VIEW COMPARISON:  None. FINDINGS: There is no fracture or dislocation. Minimal arthritic change at the  second MTP joint. Minimal dorsal spurring on the proximal navicular with slight chondrocalcinosis between the navicular and the cuneiforms only seen on the lateral view. The navicular otherwise appears normal. No other abnormalities. IMPRESSION: Minimal arthritic changes as described. Electronically  Signed   By: Lorriane Shire M.D.   On: 11/17/2018 16:38    ASSESSMENT: CLL with ATM (11q-) mutation and 13q-, anemia, ITP.  PLAN:   1. CLL- with ATM 11q- mutation and 13q-, anemia and ITP.  Pancytopenia (see below). Counts acceptable for treatment, however, patient wishes to hold treatment today due to preference to have port placed. Will work with nursing to coordinate placement asap.   2. Thrombocytopenia- improved post prednisone. Plt 60,000 today; improved. No further episodes of bleeding. Continue to monitor.   3. Anemia-hemoglobin 9.3 s/p 1 unit of pRBCs last week. Improved. Continue to monitor  4. Reaction to rituxan- rate-based. Continue premedications for all further infusions with rituxan. Orders in treatment plan.   5. Cellulitis to right intergluteal cleft: s/p I&D with Dr. Dahlia Byes. On bactrim x 10 days and scheduled to see surgery for follow-up tomorrow  6. Epistaxis: resolved. Continue to monitor in setting of ongoing thrombocytopenia  7. Neutropenia- WBC 1.2, ANC 0.8. Worse. Afebrile. Plan for Neulasta with treatments. Will plan to start vacyclovir prophylaxis once he has tapered off of prednisone and completed bactrim.   8. Elevated Creatinine- 1.33 today. Improved. IV fluids with treatment. Encouraged oral fluids in interim.  9. Poor IV access- patient wishes to have port placed for treatments due to discomfort and difficulty of IV access. Will refer for port placement asap.    Patient expressed understanding and was in agreement with this plan. He also understands that He can call clinic at any time with any questions, concerns, or complaints.   Greater than 50% was spent in counseling and coordination of care with this patient including but not limited to discussion of the relevant topics above (See A&P) including, but not limited to diagnosis and management of acute and chronic medical conditions.   A total of (25) minutes of face-to-face time was spent with this patient  with greater than 50% of that time in counseling and care-coordination.  Beckey Rutter, DNP, AGNP-[facility] [phone] (clinic)  CC: Dr. [name]

## 2018-12-08 ENCOUNTER — Other Ambulatory Visit: Admission: RE | Admit: 2018-12-08 | Payer: Medicare Other | Source: Ambulatory Visit

## 2018-12-08 ENCOUNTER — Ambulatory Visit: Payer: Medicare Other

## 2018-12-08 ENCOUNTER — Other Ambulatory Visit (INDEPENDENT_AMBULATORY_CARE_PROVIDER_SITE_OTHER): Payer: Self-pay | Admitting: Nurse Practitioner

## 2018-12-08 ENCOUNTER — Other Ambulatory Visit
Admission: RE | Admit: 2018-12-08 | Discharge: 2018-12-08 | Disposition: A | Discharge status: Home / Self Care | Payer: Medicare Other | Source: Ambulatory Visit | Attending: Surgery | Admitting: Surgery

## 2018-12-08 ENCOUNTER — Encounter: Payer: Self-pay | Admitting: Surgery

## 2018-12-08 ENCOUNTER — Ambulatory Visit: Payer: Medicare Other | Admitting: Physician Assistant

## 2018-12-08 ENCOUNTER — Other Ambulatory Visit: Payer: Self-pay

## 2018-12-08 ENCOUNTER — Ambulatory Visit (INDEPENDENT_AMBULATORY_CARE_PROVIDER_SITE_OTHER): Payer: Medicare Other | Admitting: Surgery

## 2018-12-08 VITALS — BP 174/81 | HR 63 | Temp 96.3°F | Resp 16 | Ht 71.0 in | Wt 150.0 lb

## 2018-12-08 DIAGNOSIS — Z01812 Encounter for preprocedural laboratory examination: Secondary | ICD-10-CM | POA: Insufficient documentation

## 2018-12-08 DIAGNOSIS — C9110 Chronic lymphocytic leukemia of B-cell type not having achieved remission: Secondary | ICD-10-CM | POA: Diagnosis not present

## 2018-12-08 DIAGNOSIS — Z20828 Contact with and (suspected) exposure to other viral communicable diseases: Secondary | ICD-10-CM | POA: Insufficient documentation

## 2018-12-08 LAB — SARS CORONAVIRUS 2 (TAT 6-24 HRS): SARS Coronavirus 2: NEGATIVE

## 2018-12-08 MED ORDER — CEFAZOLIN SODIUM-DEXTROSE 2-4 GM/100ML-% IV SOLN
2.0000 g | INTRAVENOUS | Status: AC
  Administered 2018-12-09: 2 g via INTRAVENOUS

## 2018-12-08 NOTE — Telephone Encounter (Signed)
Per chart review, Dr. Hampton Abbot will be inserting port tomorrow for patient.

## 2018-12-08 NOTE — Patient Instructions (Signed)

## 2018-12-08 NOTE — Progress Notes (Signed)
12/08/2018  Reason for Visit:  CLL requiring port-a-cath  Referring Provider:  Beckey Rutter, NP   History of Present Illness: Scott [name] is a 78 y.o. male presenting for evaluation of port-a-cath placement.  He has a history of CLL and has been getting infusions via peripheral IV.  He reports that it has been getting much harder to get an IV on him and it hurts him more when they try to stick him.  He would like to get a port-a-cath so there is a better access for his infusions.  He denies currently any fevers, chills, chest pain, shortness of breath.  He also does have issues with leukopenia and thrombocytopenia.  His most recent platelet count is 60 on labs yesterday and his WBC is 1.2.  He is right handed.  Of note, he recently had an I&D of left buttocks abscess with Dr. Dahlia Byes.  Past Medical History: Past Medical History:  Diagnosis Date  . Acquired hand deformity 1962   hand saw accident at work  . Anemia 10/11/2011  . Arthritis   . Bradycardia 10/10/2011  . CAD (coronary artery disease) 10/10/2011   MI s/p PTCA (Dx-OM2 proximal concentric stenosis)  . CKD (chronic kidney disease) stage 3, GFR 30-59 ml/min    [name]  . CLL (chronic lymphocytic leukemia) ([place])   . Colon polyps   . Generalized headaches    frequent  . GI bleed 07/08/2017  . Glaucoma    s/p laser surgery  . HLD (hyperlipidemia)   . Hypertension   . Hypothyroidism 10/10/2011  . Thrombocytopenia ([place]) 10/11/2011  . Type 2 diabetes with nephropathy [facility])    DM refresher course [facility] (04/2013)     Past Surgical History: Past Surgical History:  Procedure Laterality Date  . BACK SURGERY     cervical neck  . CATARACT EXTRACTION, BILATERAL Bilateral 2017  . COLONOSCOPY  11/2008   1 polyp, diverticulosis, rec rpt 5 yrs (Dr. Oletta Lamas, [name])  . COLONOSCOPY  06/2014   hyperplastic polyp, rpt 5 yrs ([place])  . COLONOSCOPY WITH PROPOFOL N/A 11/17/2017   TA, HP, (Vanga, Tally Due, MD)  . ESOPHAGOGASTRODUODENOSCOPY  (EGD) WITH PROPOFOL N/A 11/17/2017   healing erosive gastritis, intestinal metaplasia, neg H pylori (Vanga, Tally Due, MD)  . EYE SURGERY  2012   laser surgery for glaucoma  . PTCA  1994, 1995  . US ECHOCARDIOGRAPHY  10/2013   inferior wall hypokinesis, mild LVH, EF 50-55%, mild MR and LA dilation    Home Medications: Prior to Admission medications   Medication Sig Start Date End Date Taking? Authorizing Provider  acetaminophen (TYLENOL) 650 MG CR tablet Take 1,300 mg by mouth 2 (two) times daily.    Yes [provider]  atorvastatin (LIPITOR) 80 MG tablet Take 80 mg by mouth daily.   Yes [provider]  carvedilol (COREG) 6.25 MG tablet Take 1 tablet (6.25 mg total) by mouth 2 (two) times daily with a meal. Please make overdue appt with Dr. Meda Coffee before anymore refills. 3rd and Final Attempt 11/27/18  Yes Dorothy Spark, MD  clotrimazole (LOTRIMIN) 1 % cream Apply 1 application topically 2 (two) times daily. 11/04/18  Yes [name], MD  docusate sodium (COLACE) 100 MG capsule Take 1 capsule (100 mg total) by mouth 2 (two) times daily. Patient taking differently: Take 100 mg by mouth daily.  09/21/18  Yes Burns, Wandra Feinstein, NP  glucose blood (ONE TOUCH ULTRA TEST) test strip 1 each by Other route 4 (four)  times daily. and as needed to check sugars Dx code:  E11.21 09/12/17  Yes [name], MD  insulin aspart (NOVOLOG FLEXPEN) 100 UNIT/ML FlexPen Take 5 units with meals, take 10 units if mealtime sugar >250 Patient taking differently: Inject 5-10 Units into the skin See admin instructions. Take 5 units with meals, take 10 units if mealtime sugar >250 06/11/18  Yes [name], MD  Insulin Detemir (LEVEMIR FLEXTOUCH) 100 UNIT/ML Pen INJECT 25 UNITS SUBCUTANEOUSLY ONCE DAILY AT  10PM Patient taking differently: Inject 25 Units into the skin at bedtime.  01/22/18  Yes [name], MD  levothyroxine (SYNTHROID) 88 MCG tablet Take 1 tablet (88 mcg total)  by mouth daily. 06/26/18  Yes [name], MD  nitroGLYCERIN (NITROSTAT) 0.4 MG SL tablet Place 1 tablet (0.4 mg total) under the tongue every 5 (five) minutes as needed for chest pain ((MAX of 3 doses)). 02/27/18  Yes [name], MD  ondansetron (ZOFRAN) 8 MG tablet Take 1 tablet (8 mg total) by mouth 2 (two) times daily as needed for refractory nausea / vomiting. 09/07/18  Yes Lloyd Huger, MD  oxymetazoline (AFRIN) 0.05 % nasal spray Place 1 spray into left nostril daily as needed (nose bleeds).   Yes [provider]  pantoprazole (PROTONIX) 40 MG tablet Take 1 tablet (40 mg total) by mouth daily. 09/21/18  Yes Burns, Wandra Feinstein, NP  predniSONE (DELTASONE) 5 MG tablet Take 4 tablets (20 mg total) by mouth daily with breakfast for 3 days, THEN 2 tablets (10 mg total) daily with breakfast for 3 days, THEN 1 tablet (5 mg total) daily with breakfast for 3 days. 12/07/18 12/16/18 Yes Verlon Au, NP  predniSONE (DELTASONE) 50 MG tablet Take 25 mg by mouth daily with breakfast.   Yes [provider]  prochlorperazine (COMPAZINE) 10 MG tablet Take 1 tablet (10 mg total) by mouth every 6 (six) hours as needed (Nausea or vomiting). 09/07/18  Yes Lloyd Huger, MD  sulfamethoxazole-trimethoprim (BACTRIM DS) 800-160 MG tablet Take 1 tablet by mouth 2 (two) times daily. 11/23/18  Yes Jacquelin Hawking, NP  traZODone (DESYREL) 100 MG tablet Take 1 tablet (100 mg total) by mouth at bedtime as needed for sleep. Patient taking differently: Take 100 mg by mouth at bedtime.  06/11/18  Yes [name], MD    Allergies: No Known Allergies  Social History:  reports that he quit smoking about 40 years ago. He has never used smokeless tobacco. He reports that he does not drink alcohol or use drugs.   Family History: Family History  Problem Relation Age of Onset  . Diabetes Sister   . Stomach cancer Sister 66  . Pneumonia Father        caused death  . Other Brother         no communication with brother so unsure of any health conditions  . Coronary artery disease Son 53       5v CABG and stents  . Hyperlipidemia Sister   . Stroke Neg Hx   . Heart attack Neg Hx     Review of Systems: Review of Systems  Constitutional: Negative for chills and fever.  Respiratory: Negative for shortness of breath.   Cardiovascular: Negative for chest pain.  Gastrointestinal: Negative for abdominal pain, nausea and vomiting.  Genitourinary: Negative for dysuria.  Musculoskeletal: Negative for myalgias.  Skin: Negative for rash.  Neurological: Negative for dizziness.  Psychiatric/Behavioral: Negative for depression.    Physical Exam BP Marland Kitchen)  174/81   Pulse 63   Temp (!) 96.3 F (35.7 C) (Temporal)   Resp 16   Ht 5\' 11"  (1.803 m)   Wt 150 lb (68 kg)   SpO2 99%   BMI 20.92 kg/m  CONSTITUTIONAL: No acute distress HEENT:  Normocephalic, atraumatic, extraocular motion intact. NECK: Trachea is midline, and there is no jugular venous distension.  RESPIRATORY:  Lungs are clear, and breath sounds are equal bilaterally. Normal respiratory effort without pathologic use of accessory muscles. CARDIOVASCULAR: Heart is regular without murmurs, gallops, or rubs. GI: The abdomen is soft, non-distended, non-tender.  MUSCULOSKELETAL:  Normal muscle strength and tone in all four extremities.  No peripheral edema or cyanosis. SKIN: left upper buttocks I&D site healing well, without any purulent drainage, erythema, or induration.  Wound covered with bandaid. NEUROLOGIC:  Motor and sensation is grossly normal.  Cranial nerves are grossly intact. PSYCH:  Alert and oriented to person, place and time. Affect is normal.  Laboratory Analysis: Labs 12/07/18: Na 143, K 4.2, Cl 107, CO2 27, BUN 40, Cr 1.33, LFTs within normal, WBC 1.2, Hgb 9.3, Hct 28.4, Plt 60  Imaging: No results found.  Assessment and Plan: This is a 78 y.o. male with CLL requiring port-a-cath for chemotherapy  treatments.  Discussed with the patient the role for left subclavian port-a-cath placement.  Discussed with him the procedure at length, including post-op outcomes, restrictions, and risks of surgery including risks of bleeding, infection, and injury to surrounding structures.  He's scheduled for tomorrow 10/7.  He will go today for COVID testing.    His buttocks wound is healing well, without any evidence of infection.  He can do dry dressing or bandaid once daily to keep it clean and dry.  Face-to-face time spent with the patient and care providers was 60 minutes, with more than 50% of the time spent counseling, educating, and coordinating care of the patient.     Melvyn Neth, [facility]

## 2018-12-08 NOTE — H&P (View-Only) (Signed)
12/08/2018  Reason for Visit:  CLL requiring port-a-cath  Referring Provider:  Beckey Rutter, NP   History of Present Illness: Scott [name] is a 78 y.o. male presenting for evaluation of port-a-cath placement.  He has a history of CLL and has been getting infusions via peripheral IV.  He reports that it has been getting much harder to get an IV on him and it hurts him more when they try to stick him.  He would like to get a port-a-cath so there is a better access for his infusions.  He denies currently any fevers, chills, chest pain, shortness of breath.  He also does have issues with leukopenia and thrombocytopenia.  His most recent platelet count is 60 on labs yesterday and his WBC is 1.2.  He is right handed.  Of note, he recently had an I&D of left buttocks abscess with Dr. Dahlia Byes.  Past Medical History: Past Medical History:  Diagnosis Date  . Acquired hand deformity 1962   hand saw accident at work  . Anemia 10/11/2011  . Arthritis   . Bradycardia 10/10/2011  . CAD (coronary artery disease) 10/10/2011   MI s/p PTCA (Dx-OM2 proximal concentric stenosis)  . CKD (chronic kidney disease) stage 3, GFR 30-59 ml/min    [name]  . CLL (chronic lymphocytic leukemia) ([place])   . Colon polyps   . Generalized headaches    frequent  . GI bleed 07/08/2017  . Glaucoma    s/p laser surgery  . HLD (hyperlipidemia)   . Hypertension   . Hypothyroidism 10/10/2011  . Thrombocytopenia ([place]) 10/11/2011  . Type 2 diabetes with nephropathy [facility])    DM refresher course [facility] (04/2013)     Past Surgical History: Past Surgical History:  Procedure Laterality Date  . BACK SURGERY     cervical neck  . CATARACT EXTRACTION, BILATERAL Bilateral 2017  . COLONOSCOPY  11/2008   1 polyp, diverticulosis, rec rpt 5 yrs (Dr. Oletta Lamas, [name])  . COLONOSCOPY  06/2014   hyperplastic polyp, rpt 5 yrs ([place])  . COLONOSCOPY WITH PROPOFOL N/A 11/17/2017   TA, HP, (Vanga, Tally Due, MD)  . ESOPHAGOGASTRODUODENOSCOPY  (EGD) WITH PROPOFOL N/A 11/17/2017   healing erosive gastritis, intestinal metaplasia, neg H pylori (Vanga, Tally Due, MD)  . EYE SURGERY  2012   laser surgery for glaucoma  . PTCA  1994, 1995  . US ECHOCARDIOGRAPHY  10/2013   inferior wall hypokinesis, mild LVH, EF 50-55%, mild MR and LA dilation    Home Medications: Prior to Admission medications   Medication Sig Start Date End Date Taking? Authorizing Provider  acetaminophen (TYLENOL) 650 MG CR tablet Take 1,300 mg by mouth 2 (two) times daily.    Yes [provider]  atorvastatin (LIPITOR) 80 MG tablet Take 80 mg by mouth daily.   Yes [provider]  carvedilol (COREG) 6.25 MG tablet Take 1 tablet (6.25 mg total) by mouth 2 (two) times daily with a meal. Please make overdue appt with Dr. Meda Coffee before anymore refills. 3rd and Final Attempt 11/27/18  Yes Dorothy Spark, MD  clotrimazole (LOTRIMIN) 1 % cream Apply 1 application topically 2 (two) times daily. 11/04/18  Yes [name], MD  docusate sodium (COLACE) 100 MG capsule Take 1 capsule (100 mg total) by mouth 2 (two) times daily. Patient taking differently: Take 100 mg by mouth daily.  09/21/18  Yes Burns, Wandra Feinstein, NP  glucose blood (ONE TOUCH ULTRA TEST) test strip 1 each by Other route 4 (four)  times daily. and as needed to check sugars Dx code:  E11.21 09/12/17  Yes [name], MD  insulin aspart (NOVOLOG FLEXPEN) 100 UNIT/ML FlexPen Take 5 units with meals, take 10 units if mealtime sugar >250 Patient taking differently: Inject 5-10 Units into the skin See admin instructions. Take 5 units with meals, take 10 units if mealtime sugar >250 06/11/18  Yes [name], MD  Insulin Detemir (LEVEMIR FLEXTOUCH) 100 UNIT/ML Pen INJECT 25 UNITS SUBCUTANEOUSLY ONCE DAILY AT  10PM Patient taking differently: Inject 25 Units into the skin at bedtime.  01/22/18  Yes [name], MD  levothyroxine (SYNTHROID) 88 MCG tablet Take 1 tablet (88 mcg total)  by mouth daily. 06/26/18  Yes [name], MD  nitroGLYCERIN (NITROSTAT) 0.4 MG SL tablet Place 1 tablet (0.4 mg total) under the tongue every 5 (five) minutes as needed for chest pain ((MAX of 3 doses)). 02/27/18  Yes [name], MD  ondansetron (ZOFRAN) 8 MG tablet Take 1 tablet (8 mg total) by mouth 2 (two) times daily as needed for refractory nausea / vomiting. 09/07/18  Yes Lloyd Huger, MD  oxymetazoline (AFRIN) 0.05 % nasal spray Place 1 spray into left nostril daily as needed (nose bleeds).   Yes [provider]  pantoprazole (PROTONIX) 40 MG tablet Take 1 tablet (40 mg total) by mouth daily. 09/21/18  Yes Burns, Wandra Feinstein, NP  predniSONE (DELTASONE) 5 MG tablet Take 4 tablets (20 mg total) by mouth daily with breakfast for 3 days, THEN 2 tablets (10 mg total) daily with breakfast for 3 days, THEN 1 tablet (5 mg total) daily with breakfast for 3 days. 12/07/18 12/16/18 Yes Verlon Au, NP  predniSONE (DELTASONE) 50 MG tablet Take 25 mg by mouth daily with breakfast.   Yes [provider]  prochlorperazine (COMPAZINE) 10 MG tablet Take 1 tablet (10 mg total) by mouth every 6 (six) hours as needed (Nausea or vomiting). 09/07/18  Yes Lloyd Huger, MD  sulfamethoxazole-trimethoprim (BACTRIM DS) 800-160 MG tablet Take 1 tablet by mouth 2 (two) times daily. 11/23/18  Yes Jacquelin Hawking, NP  traZODone (DESYREL) 100 MG tablet Take 1 tablet (100 mg total) by mouth at bedtime as needed for sleep. Patient taking differently: Take 100 mg by mouth at bedtime.  06/11/18  Yes [name], MD    Allergies: No Known Allergies  Social History:  reports that he quit smoking about 40 years ago. He has never used smokeless tobacco. He reports that he does not drink alcohol or use drugs.   Family History: Family History  Problem Relation Age of Onset  . Diabetes Sister   . Stomach cancer Sister 69  . Pneumonia Father        caused death  . Other Brother         no communication with brother so unsure of any health conditions  . Coronary artery disease Son 76       5v CABG and stents  . Hyperlipidemia Sister   . Stroke Neg Hx   . Heart attack Neg Hx     Review of Systems: Review of Systems  Constitutional: Negative for chills and fever.  Respiratory: Negative for shortness of breath.   Cardiovascular: Negative for chest pain.  Gastrointestinal: Negative for abdominal pain, nausea and vomiting.  Genitourinary: Negative for dysuria.  Musculoskeletal: Negative for myalgias.  Skin: Negative for rash.  Neurological: Negative for dizziness.  Psychiatric/Behavioral: Negative for depression.    Physical Exam BP Marland Kitchen)  174/81   Pulse 63   Temp (!) 96.3 F (35.7 C) (Temporal)   Resp 16   Ht 5\' 11"  (1.803 m)   Wt 150 lb (68 kg)   SpO2 99%   BMI 20.92 kg/m  CONSTITUTIONAL: No acute distress HEENT:  Normocephalic, atraumatic, extraocular motion intact. NECK: Trachea is midline, and there is no jugular venous distension.  RESPIRATORY:  Lungs are clear, and breath sounds are equal bilaterally. Normal respiratory effort without pathologic use of accessory muscles. CARDIOVASCULAR: Heart is regular without murmurs, gallops, or rubs. GI: The abdomen is soft, non-distended, non-tender.  MUSCULOSKELETAL:  Normal muscle strength and tone in all four extremities.  No peripheral edema or cyanosis. SKIN: left upper buttocks I&D site healing well, without any purulent drainage, erythema, or induration.  Wound covered with bandaid. NEUROLOGIC:  Motor and sensation is grossly normal.  Cranial nerves are grossly intact. PSYCH:  Alert and oriented to person, place and time. Affect is normal.  Laboratory Analysis: Labs 12/07/18: Na 143, K 4.2, Cl 107, CO2 27, BUN 40, Cr 1.33, LFTs within normal, WBC 1.2, Hgb 9.3, Hct 28.4, Plt 60  Imaging: No results found.  Assessment and Plan: This is a 78 y.o. male with CLL requiring port-a-cath for chemotherapy  treatments.  Discussed with the patient the role for left subclavian port-a-cath placement.  Discussed with him the procedure at length, including post-op outcomes, restrictions, and risks of surgery including risks of bleeding, infection, and injury to surrounding structures.  He's scheduled for tomorrow 10/7.  He will go today for COVID testing.    His buttocks wound is healing well, without any evidence of infection.  He can do dry dressing or bandaid once daily to keep it clean and dry.  Face-to-face time spent with the patient and care providers was 60 minutes, with more than 50% of the time spent counseling, educating, and coordinating care of the patient.     Melvyn Neth, [facility]

## 2018-12-09 ENCOUNTER — Other Ambulatory Visit: Payer: Self-pay

## 2018-12-09 ENCOUNTER — Ambulatory Visit: Payer: Medicare Other

## 2018-12-09 ENCOUNTER — Ambulatory Visit: Admission: RE | Admit: 2018-12-09 | Payer: Medicare Other | Source: Ambulatory Visit | Admitting: Vascular Surgery

## 2018-12-09 ENCOUNTER — Encounter: Admission: RE | Payer: Self-pay | Source: Ambulatory Visit

## 2018-12-09 ENCOUNTER — Telehealth: Payer: Self-pay | Admitting: Surgery

## 2018-12-09 ENCOUNTER — Ambulatory Visit: Payer: Medicare Other | Admitting: Anesthesiology

## 2018-12-09 ENCOUNTER — Ambulatory Visit
Admission: RE | Admit: 2018-12-09 | Discharge: 2018-12-09 | Disposition: A | Discharge status: Home / Self Care | Payer: Medicare Other | Source: Ambulatory Visit | Attending: Surgery | Admitting: Surgery

## 2018-12-09 ENCOUNTER — Encounter
Admission: RE | Disposition: A | Discharge status: Home / Self Care | Payer: Self-pay | Source: Ambulatory Visit | Attending: Surgery

## 2018-12-09 DIAGNOSIS — I252 Old myocardial infarction: Secondary | ICD-10-CM | POA: Diagnosis not present

## 2018-12-09 DIAGNOSIS — E1122 Type 2 diabetes mellitus with diabetic chronic kidney disease: Secondary | ICD-10-CM | POA: Diagnosis not present

## 2018-12-09 DIAGNOSIS — Z4682 Encounter for fitting and adjustment of non-vascular catheter: Secondary | ICD-10-CM | POA: Diagnosis not present

## 2018-12-09 DIAGNOSIS — E785 Hyperlipidemia, unspecified: Secondary | ICD-10-CM | POA: Insufficient documentation

## 2018-12-09 DIAGNOSIS — I2510 Atherosclerotic heart disease of native coronary artery without angina pectoris: Secondary | ICD-10-CM | POA: Insufficient documentation

## 2018-12-09 DIAGNOSIS — N183 Chronic kidney disease, stage 3 unspecified: Secondary | ICD-10-CM | POA: Diagnosis not present

## 2018-12-09 DIAGNOSIS — Z79899 Other long term (current) drug therapy: Secondary | ICD-10-CM | POA: Insufficient documentation

## 2018-12-09 DIAGNOSIS — E039 Hypothyroidism, unspecified: Secondary | ICD-10-CM | POA: Insufficient documentation

## 2018-12-09 DIAGNOSIS — I129 Hypertensive chronic kidney disease with stage 1 through stage 4 chronic kidney disease, or unspecified chronic kidney disease: Secondary | ICD-10-CM | POA: Diagnosis not present

## 2018-12-09 DIAGNOSIS — Z87891 Personal history of nicotine dependence: Secondary | ICD-10-CM | POA: Diagnosis not present

## 2018-12-09 DIAGNOSIS — Z794 Long term (current) use of insulin: Secondary | ICD-10-CM | POA: Diagnosis not present

## 2018-12-09 DIAGNOSIS — Z95828 Presence of other vascular implants and grafts: Secondary | ICD-10-CM

## 2018-12-09 DIAGNOSIS — Z452 Encounter for adjustment and management of vascular access device: Secondary | ICD-10-CM | POA: Diagnosis not present

## 2018-12-09 DIAGNOSIS — Z79890 Hormone replacement therapy (postmenopausal): Secondary | ICD-10-CM | POA: Insufficient documentation

## 2018-12-09 DIAGNOSIS — C9110 Chronic lymphocytic leukemia of B-cell type not having achieved remission: Secondary | ICD-10-CM

## 2018-12-09 DIAGNOSIS — Z7952 Long term (current) use of systemic steroids: Secondary | ICD-10-CM | POA: Diagnosis not present

## 2018-12-09 DIAGNOSIS — Z955 Presence of coronary angioplasty implant and graft: Secondary | ICD-10-CM | POA: Diagnosis not present

## 2018-12-09 HISTORY — PX: PORTACATH PLACEMENT: SHX2246

## 2018-12-09 LAB — GLUCOSE, CAPILLARY
Glucose-Capillary: 73 mg/dL (ref 70–99)
Glucose-Capillary: 232 mg/dL — ABNORMAL HIGH (ref 70–99)
Glucose-Capillary: 153 mg/dL — ABNORMAL HIGH (ref 70–99)
Glucose-Capillary: 185 mg/dL — ABNORMAL HIGH (ref 70–99)

## 2018-12-09 SURGERY — PORTA CATH INSERTION
Anesthesia: Moderate Sedation

## 2018-12-09 SURGERY — INSERTION, TUNNELED CENTRAL VENOUS DEVICE, WITH PORT
Anesthesia: General | Site: Chest | Laterality: Left

## 2018-12-09 MED ORDER — HEPARIN SODIUM (PORCINE) 5000 UNIT/ML IJ SOLN
INTRAMUSCULAR | Status: AC
  Filled 2018-12-09: qty 1

## 2018-12-09 MED ORDER — LIDOCAINE HCL (PF) 1 % IJ SOLN
INTRAMUSCULAR | Status: DC | PRN
  Administered 2018-12-09: 10 mL

## 2018-12-09 MED ORDER — CEFAZOLIN SODIUM-DEXTROSE 2-4 GM/100ML-% IV SOLN
INTRAVENOUS | Status: AC
  Filled 2018-12-09: qty 100

## 2018-12-09 MED ORDER — FENTANYL CITRATE (PF) 100 MCG/2ML IJ SOLN
INTRAMUSCULAR | Status: DC | PRN
  Administered 2018-12-09 (×3): 12.5 ug via INTRAVENOUS
  Administered 2018-12-09: 25 ug via INTRAVENOUS

## 2018-12-09 MED ORDER — GABAPENTIN 300 MG PO CAPS
ORAL_CAPSULE | ORAL | Status: AC
  Filled 2018-12-09: qty 1

## 2018-12-09 MED ORDER — FENTANYL CITRATE (PF) 100 MCG/2ML IJ SOLN
INTRAMUSCULAR | Status: AC
  Filled 2018-12-09: qty 2

## 2018-12-09 MED ORDER — EPHEDRINE SULFATE 50 MG/ML IJ SOLN
INTRAMUSCULAR | Status: DC | PRN
  Administered 2018-12-09: 5 mg via INTRAVENOUS
  Administered 2018-12-09: 10 mg via INTRAVENOUS
  Administered 2018-12-09 (×2): 5 mg via INTRAVENOUS
  Administered 2018-12-09: 10 mg via INTRAVENOUS

## 2018-12-09 MED ORDER — LIDOCAINE HCL (PF) 1 % IJ SOLN
INTRAMUSCULAR | Status: AC
  Filled 2018-12-09: qty 30

## 2018-12-09 MED ORDER — CHLORHEXIDINE GLUCONATE CLOTH 2 % EX PADS: 6.0000 | MEDICATED_PAD | Freq: Once | CUTANEOUS | Status: DC

## 2018-12-09 MED ORDER — FENTANYL CITRATE (PF) 250 MCG/5ML IJ SOLN
INTRAMUSCULAR | Status: AC
  Filled 2018-12-09: qty 5

## 2018-12-09 MED ORDER — LIDOCAINE HCL (CARDIAC) PF 100 MG/5ML IV SOSY
PREFILLED_SYRINGE | INTRAVENOUS | Status: DC | PRN
  Administered 2018-12-09: 60 mg via INTRAVENOUS

## 2018-12-09 MED ORDER — DEXTROSE-NACL 5-0.45 % IV SOLN
Freq: Once | INTRAVENOUS | Status: AC
  Administered 2018-12-09: 13:00:00 via INTRAVENOUS

## 2018-12-09 MED ORDER — GABAPENTIN 300 MG PO CAPS
300.0000 mg | ORAL_CAPSULE | ORAL | Status: AC
  Administered 2018-12-09: 14:00:00 300 mg via ORAL

## 2018-12-09 MED ORDER — ACETAMINOPHEN 500 MG PO TABS
ORAL_TABLET | ORAL | Status: AC
  Filled 2018-12-09: qty 2

## 2018-12-09 MED ORDER — SODIUM CHLORIDE (PF) 0.9 % IJ SOLN
INTRAMUSCULAR | Status: AC
  Filled 2018-12-09: qty 50

## 2018-12-09 MED ORDER — OXYCODONE HCL 5 MG PO TABS: 5.0000 mg | ORAL_TABLET | ORAL | 0 refills | Status: DC | PRN

## 2018-12-09 MED ORDER — BUPIVACAINE HCL (PF) 0.5 % IJ SOLN
INTRAMUSCULAR | Status: DC | PRN
  Administered 2018-12-09: 10 mL

## 2018-12-09 MED ORDER — SODIUM CHLORIDE 0.9 % IV SOLN: INTRAVENOUS | Status: DC

## 2018-12-09 MED ORDER — PROPOFOL 500 MG/50ML IV EMUL
INTRAVENOUS | Status: AC
  Filled 2018-12-09: qty 50

## 2018-12-09 MED ORDER — ACETAMINOPHEN 500 MG PO TABS
1000.0000 mg | ORAL_TABLET | ORAL | Status: AC
  Administered 2018-12-09: 14:00:00 1000 mg via ORAL

## 2018-12-09 MED ORDER — ONDANSETRON HCL 4 MG/2ML IJ SOLN: 4.0000 mg | Freq: Once | INTRAMUSCULAR | Status: DC | PRN

## 2018-12-09 MED ORDER — SODIUM CHLORIDE 0.9 % IV SOLN
INTRAVENOUS | Status: DC
  Administered 2018-12-09: 13:00:00 50 mL/h via INTRAVENOUS

## 2018-12-09 MED ORDER — ONDANSETRON HCL 4 MG/2ML IJ SOLN
INTRAMUSCULAR | Status: DC | PRN
  Administered 2018-12-09: 4 mg via INTRAVENOUS

## 2018-12-09 MED ORDER — PROPOFOL 500 MG/50ML IV EMUL
INTRAVENOUS | Status: DC | PRN
  Administered 2018-12-09: 100 ug/kg/min via INTRAVENOUS

## 2018-12-09 MED ORDER — FENTANYL CITRATE (PF) 100 MCG/2ML IJ SOLN
25.0000 ug | INTRAMUSCULAR | Status: DC | PRN
  Administered 2018-12-09: 17:00:00 25 ug via INTRAVENOUS

## 2018-12-09 MED ORDER — CHLORHEXIDINE GLUCONATE CLOTH 2 % EX PADS
6.0000 | MEDICATED_PAD | Freq: Once | CUTANEOUS | Status: AC
  Administered 2018-12-09: 13:00:00 6 via TOPICAL

## 2018-12-09 MED ORDER — BUPIVACAINE HCL (PF) 0.5 % IJ SOLN
INTRAMUSCULAR | Status: AC
  Filled 2018-12-09: qty 30

## 2018-12-09 SURGICAL SUPPLY — 32 items
ADH SKN CLS APL DERMABOND .7 (GAUZE/BANDAGES/DRESSINGS) ×1
APL PRP STRL LF DISP 70% ISPRP (MISCELLANEOUS) ×1
BLADE SURG SZ11 CARB STEEL (BLADE) ×3 IMPLANT
CANISTER SUCT 1200ML W/VALVE (MISCELLANEOUS) ×3 IMPLANT
CHLORAPREP W/TINT 26 (MISCELLANEOUS) ×3 IMPLANT
COVER LIGHT HANDLE STERIS (MISCELLANEOUS) ×6 IMPLANT
COVER WAND RF STERILE (DRAPES) IMPLANT
DECANTER SPIKE VIAL GLASS SM (MISCELLANEOUS) IMPLANT
DERMABOND ADVANCED (GAUZE/BANDAGES/DRESSINGS) ×2
DERMABOND ADVANCED .7 DNX12 (GAUZE/BANDAGES/DRESSINGS) ×1 IMPLANT
DRAPE C-ARM XRAY 36X54 (DRAPES) ×3 IMPLANT
ELECT REM PT RETURN 9FT ADLT (ELECTROSURGICAL) ×3
ELECTRODE REM PT RTRN 9FT ADLT (ELECTROSURGICAL) ×1 IMPLANT
GLOVE SURG SYN 7.0 (GLOVE) ×6 IMPLANT
GLOVE SURG SYN 7.5  E (GLOVE) ×4
GLOVE SURG SYN 7.5 E (GLOVE) ×2 IMPLANT
GOWN STRL REUS W/ TWL LRG LVL3 (GOWN DISPOSABLE) ×2 IMPLANT
GOWN STRL REUS W/TWL LRG LVL3 (GOWN DISPOSABLE) ×6
KIT PORT POWER 8FR ISP CVUE (Port) ×3 IMPLANT
KIT TURNOVER KIT A (KITS) ×3 IMPLANT
LABEL OR SOLS (LABEL) ×3 IMPLANT
NEEDLE FILTER BLUNT 18X 1/2SAF (NEEDLE) ×2
NEEDLE FILTER BLUNT 18X1 1/2 (NEEDLE) ×1 IMPLANT
NEEDLE HYPO 22GX1.5 SAFETY (NEEDLE) ×3 IMPLANT
NS IRRIG 500ML POUR BTL (IV SOLUTION) ×3 IMPLANT
PACK PORT-A-CATH (MISCELLANEOUS) ×3 IMPLANT
SUT MNCRL AB 4-0 PS2 18 (SUTURE) ×3 IMPLANT
SUT PROLENE 3 0 SH DA (SUTURE) ×3 IMPLANT
SUT VIC AB 3-0 SH 27 (SUTURE) ×3
SUT VIC AB 3-0 SH 27X BRD (SUTURE) ×1 IMPLANT
SYR 10ML LL (SYRINGE) ×3 IMPLANT
SYR 3ML LL SCALE MARK (SYRINGE) ×3 IMPLANT

## 2018-12-09 NOTE — Anesthesia Preprocedure Evaluation (Addendum)
Anesthesia Evaluation  Patient identified by MRN, date of birth, ID band Patient awake    Reviewed: Allergy & Precautions, NPO status , Patient's Chart, lab work & pertinent test results  History of Anesthesia Complications Negative for: history of anesthetic complications  Airway Mallampati: II       Dental  (+) Edentulous Upper, Edentulous Lower   Pulmonary neg sleep apnea, neg COPD, Not current smoker, former smoker,           Cardiovascular hypertension, Pt. on medications and Pt. on home beta blockers + Past MI and + Cardiac Stents  (-) dysrhythmias (-) Valvular Problems/Murmurs     Neuro/Psych neg Seizures    GI/Hepatic Neg liver ROS, neg GERD  ,  Endo/Other  diabetes, Type 2, Insulin DependentHypothyroidism   Renal/GU Renal InsufficiencyRenal disease     Musculoskeletal   Abdominal   Peds  Hematology  (+) anemia ,   Anesthesia Other Findings   Reproductive/Obstetrics                           Anesthesia Physical Anesthesia Plan  ASA: III  Anesthesia Plan: General   Post-op Pain Management:    Induction: Intravenous  PONV Risk Score and Plan: 2 and Propofol infusion and TIVA  Airway Management Planned: Nasal Cannula  Additional Equipment:   Intra-op Plan:   Post-operative Plan:   Informed Consent: I have reviewed the patients History and Physical, chart, labs and discussed the procedure including the risks, benefits and alternatives for the proposed anesthesia with the patient or authorized representative who has indicated his/her understanding and acceptance.       Plan Discussed with:   Anesthesia Plan Comments:         Anesthesia Quick Evaluation

## 2018-12-09 NOTE — Anesthesia Post-op Follow-up Note (Signed)
Anesthesia QCDR form completed.        

## 2018-12-09 NOTE — Op Note (Signed)
  Procedure Date:  12/09/2018  Pre-operative Diagnosis:  Chronic Lymphocytic Leukemia  Post-operative Diagnosis:  Chronic Lymphocytic Leukemia  Procedure:  Left subclavian port-a-cath placement  Surgeon:  Melvyn Neth, MD  Assistant:  Deno Etienne, PA-S  Anesthesia:  MAC  Estimated Blood Loss:  10 ml  Specimens:  None  Complications:  None apparent  Indications for Procedure:  This is a 78 y.o. male who requires a port-a-cath for chemotherapy.  The risks of bleeding, infection, injury to surrounding structures, thrombosis, nonfunction, pneumothorax, hemothorax, and need for further procedures were discussed with the patient and was willing to proceed.  Description of Procedure: The patient was correctly identified in the preoperative area and brought into the operating room.  The patient was placed supine with VTE prophylaxis in place.  Appropriate time-outs were performed.  Anesthesia was induced and the patient was intubated.  Appropriate antibiotics were infused.  The left chest and neck were prepped and draped in usual sterile fashion. The patient was placed in Trendelenburg position and local anesthetic was infiltrated into the skin and subcutaneous tissues in the anterior chest wall. The large bore needle was placed into the subclavian vein using ultrasound guidance and then the Seldinger wire was advanced. At first the wire was advancing up to the jugular vein, but was able to mobilize well to go down to SVC.  Fluoroscopy was utilized to confirm that the Seldinger wire was in the superior vena cava.  An incision was made and a port pocket developed with blunt and electrocautery dissection. The introducer dilator was placed over the Seldinger wire the wire was removed. The previously flushed catheter was placed into the introducer dilator and the peel-away sheath was removed. The catheter length was confirmed and trimmed utilizing fluoroscopy for proper positioning. The  catheter was then attached to the previously flushed port. The port was placed into the pocket. The port was secured in place with 3-0 Prolenes and flushed for function and heparin locked.  The wound was closed with interrupted 3-0 Vicryl followed by 4-0 subcuticular Monocryl sutures and sealed with DermaBond.  The patient was emerged from anesthesia and extubated and brought to the recovery room for further management.  A chest x-ray was ordered.  The patient tolerated the procedure well and all counts were correct at the end of the case.   Melvyn Neth, MD

## 2018-12-09 NOTE — Telephone Encounter (Signed)
Pt has been advised of pre admission date/time, Covid Testing date and Surgery date.  Surgery Date: 12/09/18 with Dr Stephanie Acre placement Preadmission Testing Date: Patient informed to arrive 2 hours early-[facility]-the day of surgery-1:00 Covid Testing Date: 12/08/18-completed - patient advised to go to the [place] ([place])

## 2018-12-09 NOTE — Anesthesia Postprocedure Evaluation (Signed)
Anesthesia Post Note  Patient: Scott [name]  Procedure(s) Performed: INSERTION PORT-A-CATH (Left Chest)  Patient location during evaluation: PACU Anesthesia Type: General Level of consciousness: awake and alert Pain management: pain level controlled Vital Signs Assessment: post-procedure vital signs reviewed and stable Respiratory status: spontaneous breathing, nonlabored ventilation, respiratory function stable and patient connected to nasal cannula oxygen Cardiovascular status: blood pressure returned to baseline and stable Postop Assessment: no apparent nausea or vomiting Anesthetic complications: no     Last Vitals:  Vitals:   12/09/18 1718 12/09/18 1725  BP: (!) 173/65 (!) 166/67  Pulse: (!) 58 (!) 54  Resp: 17 (!) 22  Temp:  (!) 36.4 C  SpO2: 98% 99%    Last Pain:  Vitals:   12/09/18 1725  TempSrc:   PainSc: 2                  Precious Haws [name]

## 2018-12-09 NOTE — Interval H&P Note (Signed)
History and Physical Interval Note:  12/09/2018 2:52 PM  [name]  has presented today for surgery, with the diagnosis of CLL.  The various methods of treatment have been discussed with the patient and family. After consideration of risks, benefits and other options for treatment, the patient has consented to  Procedure(s): INSERTION PORT-A-CATH (N/A) as a surgical intervention.  The patient's history has been reviewed, patient examined, no change in status, stable for surgery.  I have reviewed the patient's chart and labs.  Questions were answered to the patient's satisfaction.     Yancy Knoble

## 2018-12-09 NOTE — Discharge Instructions (Signed)

## 2018-12-09 NOTE — Transfer of Care (Signed)
Immediate Anesthesia Transfer of Care Note  Patient: Scott [name]  Procedure(s) Performed: INSERTION PORT-A-CATH (Left Chest)  Patient Location: PACU    Anesthesia Type:MAC   Level of Consciousness: sedated  Airway & Oxygen Therapy: Patient Spontanous Breathing and Patient connected to face mask oxygen  Post-op Assessment: Report given to RN  Post vital signs: Reviewed and stable  Last Vitals:  Vitals Value Taken Time  BP 154/65 12/09/18 1648  Temp    Pulse 58 12/09/18 1647  Resp 13 12/09/18 1647  SpO2 100 % 12/09/18 1647  Vitals shown include unvalidated device data.  Last Pain:  Vitals:   12/09/18 1251  TempSrc: Tympanic  PainSc: 0-No pain         Complications: No apparent anesthesia complications

## 2018-12-10 ENCOUNTER — Other Ambulatory Visit: Payer: Self-pay | Admitting: Family Medicine

## 2018-12-10 ENCOUNTER — Encounter: Payer: Self-pay | Admitting: Surgery

## 2018-12-14 ENCOUNTER — Inpatient Hospital Stay: Payer: Medicare Other

## 2018-12-14 ENCOUNTER — Encounter: Payer: Self-pay | Admitting: Nurse Practitioner

## 2018-12-14 ENCOUNTER — Inpatient Hospital Stay (HOSPITAL_BASED_OUTPATIENT_CLINIC_OR_DEPARTMENT_OTHER): Payer: Medicare Other | Admitting: Nurse Practitioner

## 2018-12-14 ENCOUNTER — Other Ambulatory Visit: Payer: Self-pay | Admitting: Physician Assistant

## 2018-12-14 ENCOUNTER — Other Ambulatory Visit: Payer: Self-pay

## 2018-12-14 VITALS — BP 164/74 | HR 61 | Temp 95.3°F | Resp 18 | Wt 151.3 lb

## 2018-12-14 VITALS — BP 140/67 | HR 66 | Resp 18

## 2018-12-14 DIAGNOSIS — E7849 Other hyperlipidemia: Secondary | ICD-10-CM

## 2018-12-14 DIAGNOSIS — I10 Essential (primary) hypertension: Secondary | ICD-10-CM

## 2018-12-14 DIAGNOSIS — E785 Hyperlipidemia, unspecified: Secondary | ICD-10-CM

## 2018-12-14 DIAGNOSIS — C9110 Chronic lymphocytic leukemia of B-cell type not having achieved remission: Secondary | ICD-10-CM | POA: Diagnosis not present

## 2018-12-14 DIAGNOSIS — Z452 Encounter for adjustment and management of vascular access device: Secondary | ICD-10-CM | POA: Diagnosis not present

## 2018-12-14 DIAGNOSIS — I2510 Atherosclerotic heart disease of native coronary artery without angina pectoris: Secondary | ICD-10-CM

## 2018-12-14 DIAGNOSIS — Z5111 Encounter for antineoplastic chemotherapy: Secondary | ICD-10-CM | POA: Diagnosis not present

## 2018-12-14 DIAGNOSIS — Z5112 Encounter for antineoplastic immunotherapy: Secondary | ICD-10-CM | POA: Diagnosis not present

## 2018-12-14 DIAGNOSIS — D693 Immune thrombocytopenic purpura: Secondary | ICD-10-CM | POA: Diagnosis not present

## 2018-12-14 DIAGNOSIS — Z5189 Encounter for other specified aftercare: Secondary | ICD-10-CM | POA: Diagnosis not present

## 2018-12-14 LAB — CBC WITH DIFFERENTIAL/PLATELET
Abs Immature Granulocytes: 0.07 10*3/uL (ref 0.00–0.07)
Basophils Absolute: 0 10*3/uL (ref 0.0–0.1)
Basophils Relative: 0 %
Eosinophils Absolute: 0 10*3/uL (ref 0.0–0.5)
Eosinophils Relative: 0 %
HCT: 28.7 % — ABNORMAL LOW (ref 39.0–52.0)
Hemoglobin: 9.3 g/dL — ABNORMAL LOW (ref 13.0–17.0)
Immature Granulocytes: 5 %
Lymphocytes Relative: 18 %
Lymphs Abs: 0.3 10*3/uL — ABNORMAL LOW (ref 0.7–4.0)
MCH: 33.6 pg (ref 26.0–34.0)
MCHC: 32.4 g/dL (ref 30.0–36.0)
MCV: 103.6 fL — ABNORMAL HIGH (ref 80.0–100.0)
Monocytes Absolute: 0.1 10*3/uL (ref 0.1–1.0)
Monocytes Relative: 8 %
Neutro Abs: 1 10*3/uL — ABNORMAL LOW (ref 1.7–7.7)
Neutrophils Relative %: 69 %
Platelets: 71 10*3/uL — ABNORMAL LOW (ref 150–400)
RBC: 2.77 MIL/uL — ABNORMAL LOW (ref 4.22–5.81)
RDW: 17.7 % — ABNORMAL HIGH (ref 11.5–15.5)
WBC: 1.5 10*3/uL — ABNORMAL LOW (ref 4.0–10.5)
nRBC: 0 % (ref 0.0–0.2)

## 2018-12-14 LAB — COMPREHENSIVE METABOLIC PANEL
ALT: 21 U/L (ref 0–44)
AST: 22 U/L (ref 15–41)
Albumin: 3.8 g/dL (ref 3.5–5.0)
Alkaline Phosphatase: 71 U/L (ref 38–126)
Anion gap: 8 (ref 5–15)
BUN: 31 mg/dL — ABNORMAL HIGH (ref 8–23)
CO2: 29 mmol/L (ref 22–32)
Calcium: 9.2 mg/dL (ref 8.9–10.3)
Chloride: 104 mmol/L (ref 98–111)
Creatinine, Ser: 1.27 mg/dL — ABNORMAL HIGH (ref 0.61–1.24)
GFR calc Af Amer: >60 mL/min (ref >60)
GFR calc non Af Amer: 54 mL/min — ABNORMAL LOW (ref >60)
Glucose, Bld: 146 mg/dL — ABNORMAL HIGH (ref 70–99)
Potassium: 4.1 mmol/L (ref 3.5–5.1)
Sodium: 141 mmol/L (ref 135–145)
Total Bilirubin: 0.8 mg/dL (ref 0.3–1.2)
Total Protein: 6.2 g/dL — ABNORMAL LOW (ref 6.5–8.1)

## 2018-12-14 MED ORDER — SODIUM CHLORIDE 0.9 % IV SOLN
70.0000 mg/m2 | Freq: Once | INTRAVENOUS | Status: AC
  Administered 2018-12-14: 125 mg via INTRAVENOUS
  Filled 2018-12-14: qty 5

## 2018-12-14 MED ORDER — SODIUM CHLORIDE 0.9 % IV SOLN
Freq: Once | INTRAVENOUS | Status: AC
  Administered 2018-12-14: 10:00:00 via INTRAVENOUS
  Filled 2018-12-14: qty 250

## 2018-12-14 MED ORDER — FAMOTIDINE IN NACL 20-0.9 MG/50ML-% IV SOLN
20.0000 mg | Freq: Once | INTRAVENOUS | Status: AC
  Administered 2018-12-14: 12:00:00 20 mg via INTRAVENOUS

## 2018-12-14 MED ORDER — SODIUM CHLORIDE 0.9 % IV SOLN
375.0000 mg/m2 | Freq: Once | INTRAVENOUS | Status: AC
  Administered 2018-12-14: 700 mg via INTRAVENOUS
  Filled 2018-12-14: qty 50

## 2018-12-14 MED ORDER — ACETAMINOPHEN 325 MG PO TABS
650.0000 mg | ORAL_TABLET | Freq: Once | ORAL | Status: AC
  Administered 2018-12-14: 11:00:00 650 mg via ORAL
  Filled 2018-12-14: qty 2

## 2018-12-14 MED ORDER — DEXAMETHASONE SODIUM PHOSPHATE 10 MG/ML IJ SOLN
10.0000 mg | Freq: Once | INTRAMUSCULAR | Status: AC
  Administered 2018-12-14: 10 mg via INTRAVENOUS
  Filled 2018-12-14: qty 1

## 2018-12-14 MED ORDER — DIPHENHYDRAMINE HCL 25 MG PO CAPS
25.0000 mg | ORAL_CAPSULE | Freq: Once | ORAL | Status: AC
  Administered 2018-12-14: 25 mg via ORAL
  Filled 2018-12-14: qty 1

## 2018-12-14 MED ORDER — PALONOSETRON HCL INJECTION 0.25 MG/5ML
0.2500 mg | Freq: Once | INTRAVENOUS | Status: AC
  Administered 2018-12-14: 0.25 mg via INTRAVENOUS
  Filled 2018-12-14: qty 5

## 2018-12-14 MED ORDER — METHYLPREDNISOLONE SODIUM SUCC 125 MG IJ SOLR
125.0000 mg | Freq: Once | INTRAMUSCULAR | Status: AC
  Administered 2018-12-14: 11:00:00 125 mg via INTRAVENOUS

## 2018-12-14 NOTE — Progress Notes (Signed)
[facility]  [phone]) [phone] [phone]) [phone]  ID: [name] OB: 1940/07/25  MR#: [id number]  RWE#:[id number]  Patient Care Team: [name], MD as PCP - General (Family Medicine) Lloyd Huger, MD as Consulting Physician (Oncology) Clent Jacks, MD as Consulting Physician (Ophthalmology) Dorothy Spark, MD as Consulting Physician (Cardiology) Madelon Lips, MD as Consulting Physician (Nephrology)  CHIEF COMPLAINT: CLL with ATM (11q-) mutation and 13q-, ITP   INTERVAL HISTORY: Scott [name], 78 year old male, returns to clinic for consideration of cycle 3, day 1 of bendamustine and rituxan with Neulasta support. Previously, treatment has been held due to ongoing thrombocytopenia (status post prednisone) and wound on gluteal fold (status post I&D and Bactrim), and port placement due to poor IV access.  Today, he says that port was placed with Dr.Piscoya on 12/09/2018 and he is healing well.  After his visit last week he had one episode of nosebleed.  None since.  He wishes to proceed with treatment today.  Denies blood in the stool.  Weakness and fatigue have improved slightly.  Appetite and weight are stable.  No recent fevers or illness.  Denies nausea, vomiting, diarrhea, constipation.  Denies chest pain or palpitations.  Denies urinary complaints.  Denies other specific complaints today.  REVIEW OF SYSTEMS:   Review of Systems  Constitutional: Positive for malaise/fatigue. Negative for chills, fever and weight loss.  Eyes: Negative for blurred vision, pain and redness.  Respiratory: Negative for cough, hemoptysis, sputum production and shortness of breath.   Cardiovascular: Negative for chest pain, palpitations, orthopnea, claudication and leg swelling.  Gastrointestinal: Negative for abdominal pain, constipation, diarrhea, heartburn, nausea and vomiting.  Genitourinary: Negative for dysuria, frequency and urgency.  Musculoskeletal:  Negative for back pain, falls, joint pain, myalgias and neck pain.  Skin:       Sacral lesion-s/p I&D New port- left upper chest  Neurological: Positive for weakness. Negative for dizziness and headaches.  Endo/Heme/Allergies: Negative for environmental allergies. Bruises/bleeds easily.  Psychiatric/Behavioral: Negative for depression. The patient is nervous/anxious. The patient does not have insomnia.     PAST MEDICAL HISTORY: Past Medical History:  Diagnosis Date  . Acquired hand deformity 1962   hand saw accident at work  . Anemia 10/11/2011  . Arthritis   . Bradycardia 10/10/2011  . CAD (coronary artery disease) 10/10/2011   MI s/p PTCA (Dx-OM2 proximal concentric stenosis)  . CKD (chronic kidney disease) stage 3, GFR 30-59 ml/min    [name]  . CLL (chronic lymphocytic leukemia) ([place])   . Colon polyps   . Generalized headaches    frequent  . GI bleed 07/08/2017  . Glaucoma    s/p laser surgery  . HLD (hyperlipidemia)   . Hypertension   . Hypothyroidism 10/10/2011  . Thrombocytopenia ([place]) 10/11/2011  . Type 2 diabetes with nephropathy [facility])    DM refresher course [facility] (04/2013)    PAST SURGICAL HISTORY: Past Surgical History:  Procedure Laterality Date  . BACK SURGERY     cervical neck  . CATARACT EXTRACTION, BILATERAL Bilateral 2017  . COLONOSCOPY  11/2008   1 polyp, diverticulosis, rec rpt 5 yrs (Dr. Oletta Lamas, [name])  . COLONOSCOPY  06/2014   hyperplastic polyp, rpt 5 yrs ([place])  . COLONOSCOPY WITH PROPOFOL N/A 11/17/2017   TA, HP, (Vanga, Tally Due, MD)  . ESOPHAGOGASTRODUODENOSCOPY (EGD) WITH PROPOFOL N/A 11/17/2017   healing erosive gastritis, intestinal metaplasia, neg H pylori (Vanga, Tally Due, MD)  . EYE SURGERY  2012  laser surgery for glaucoma  . PORTACATH PLACEMENT Left 12/09/2018   Procedure: INSERTION PORT-A-CATH;  Surgeon: [name], MD;  Location: [facility] ORS;  Service: General;  Laterality: Left;  . PTCA  1994, 1995  . US ECHOCARDIOGRAPHY  10/2013    inferior wall hypokinesis, mild LVH, EF 50-55%, mild MR and LA dilation    FAMILY HISTORY: Family History  Problem Relation Age of Onset  . Diabetes Sister   . Stomach cancer Sister 51  . Pneumonia Father        caused death  . Other Brother        no communication with brother so unsure of any health conditions  . Coronary artery disease Son 23       5v CABG and stents  . Hyperlipidemia Sister   . Stroke Neg Hx   . Heart attack Neg Hx     ADVANCED DIRECTIVES (Y/N):  N  HEALTH MAINTENANCE: Social History   Tobacco Use  . Smoking status: Former Smoker    Quit date: 03/04/1978    Years since quitting: 40.8  . Smokeless tobacco: Never Used  Substance Use Topics  . Alcohol use: No  . Drug use: No    Colonoscopy:  Bone density:  Lipid panel:  No Known Allergies  Current Outpatient Medications  Medication Sig Dispense Refill  . acetaminophen (TYLENOL) 650 MG CR tablet Take 1,300 mg by mouth 2 (two) times daily.     Marland Kitchen atorvastatin (LIPITOR) 80 MG tablet Take 80 mg by mouth daily.    . carvedilol (COREG) 6.25 MG tablet Take 1 tablet (6.25 mg total) by mouth 2 (two) times daily with a meal. Please make overdue appt with Dr. Meda Coffee before anymore refills. 3rd and Final Attempt 30 tablet 0  . clotrimazole (LOTRIMIN) 1 % cream Apply 1 application topically 2 (two) times daily. 80 g 0  . docusate sodium (COLACE) 100 MG capsule Take 1 capsule (100 mg total) by mouth 2 (two) times daily. (Patient taking differently: Take 100 mg by mouth daily. ) 10 capsule 0  . glucose blood (ONE TOUCH ULTRA TEST) test strip 1 each by Other route 4 (four) times daily. and as needed to check sugars Dx code:  E11.21 400 each 3  . insulin aspart (NOVOLOG FLEXPEN) 100 UNIT/ML FlexPen Take 5 units with meals, take 10 units if mealtime sugar >250 (Patient taking differently: Inject 5-10 Units into the skin See admin instructions. Take 5 units with meals, take 10 units if mealtime sugar >250) 15 mL 11  .  Insulin Detemir (LEVEMIR FLEXTOUCH) 100 UNIT/ML Pen INJECT 25 UNITS SUBCUTANEOUSLY ONCE DAILY AT  10PM (Patient taking differently: Inject 25 Units into the skin at bedtime. ) 15 pen 3  . levothyroxine (SYNTHROID) 88 MCG tablet Take 1 tablet (88 mcg total) by mouth daily. 90 tablet 1  . nitroGLYCERIN (NITROSTAT) 0.4 MG SL tablet Place 1 tablet (0.4 mg total) under the tongue every 5 (five) minutes as needed for chest pain ((MAX of 3 doses)). 25 tablet 0  . ondansetron (ZOFRAN) 8 MG tablet Take 1 tablet (8 mg total) by mouth 2 (two) times daily as needed for refractory nausea / vomiting. 30 tablet 2  . oxyCODONE (OXY IR/ROXICODONE) 5 MG immediate release tablet Take 1 tablet (5 mg total) by mouth every 4 (four) hours as needed for severe pain. 20 tablet 0  . oxymetazoline (AFRIN) 0.05 % nasal spray Place 1 spray into left nostril daily as needed (nose bleeds).    Marland Kitchen  pantoprazole (PROTONIX) 40 MG tablet Take 1 tablet (40 mg total) by mouth daily. 90 tablet 0  . predniSONE (DELTASONE) 5 MG tablet Take 4 tablets (20 mg total) by mouth daily with breakfast for 3 days, THEN 2 tablets (10 mg total) daily with breakfast for 3 days, THEN 1 tablet (5 mg total) daily with breakfast for 3 days. 21 tablet 0  . prochlorperazine (COMPAZINE) 10 MG tablet Take 1 tablet (10 mg total) by mouth every 6 (six) hours as needed (Nausea or vomiting). 60 tablet 2  . sulfamethoxazole-trimethoprim (BACTRIM DS) 800-160 MG tablet Take 1 tablet by mouth 2 (two) times daily. 20 tablet 0  . traZODone (DESYREL) 100 MG tablet TAKE 1 TABLET BY MOUTH AT BEDTIME AS NEEDED FOR SLEEP 90 tablet 0   No current facility-administered medications for this visit.     OBJECTIVE: Vitals:   12/14/18 0922  BP: (!) 164/74  Pulse: 61  Resp: 18  Temp: (!) 95.3 F (35.2 C)  SpO2: 100%     Body mass index is 21.1 kg/m.      ECOG FS:1 - Symptomatic but completely ambulatory   Physical Exam Constitutional:      Comments: Thin  build-unaccompanied, wearing mask  HENT:     Head: Normocephalic and atraumatic.     Nose: Nose normal.  Eyes:     General: No scleral icterus.    Conjunctiva/sclera: Conjunctivae normal.  Cardiovascular:     Rate and Rhythm: Normal rate and regular rhythm.  Pulmonary:     Effort: Pulmonary effort is normal.     Breath sounds: Normal breath sounds.  Abdominal:     Palpations: Abdomen is soft.     Tenderness: There is no guarding.  Musculoskeletal:        General: No swelling or tenderness.  Skin:    General: Skin is warm and dry.  Neurological:     Mental Status: He is alert and oriented to person, place, and time.     Motor: No weakness.  Psychiatric:        Mood and Affect: Mood is anxious.        Speech: Speech normal.        Behavior: Behavior is cooperative.      Lab Results  Component Value Date   NA 141 12/14/2018   K 4.1 12/14/2018   CL 104 12/14/2018   CO2 29 12/14/2018   GLUCOSE 146 (H) 12/14/2018   BUN 31 (H) 12/14/2018   CREATININE 1.27 (H) 12/14/2018   CALCIUM 9.2 12/14/2018   PROT 6.2 (L) 12/14/2018   ALBUMIN 3.8 12/14/2018   AST 22 12/14/2018   ALT 21 12/14/2018   ALKPHOS 71 12/14/2018   BILITOT 0.8 12/14/2018   GFRNONAA 54 (L) 12/14/2018   GFRAA >60 12/14/2018    Lab Results  Component Value Date   WBC 1.5 (L) 12/14/2018   NEUTROABS 1.0 (L) 12/14/2018   HGB 9.3 (L) 12/14/2018   HCT 28.7 (L) 12/14/2018   MCV 103.6 (H) 12/14/2018   PLT 71 (L) 12/14/2018     STUDIES: Dg Chest Port 1 View  Result Date: 12/09/2018 CLINICAL DATA:  POST OP INSERTION PORT-A-CATH. EXAM: PORTABLE CHEST 1 VIEW COMPARISON:  None. FINDINGS: There is a left-sided Port-A-Cath with the tip projecting over the SVC. There are low lung volumes. There is no focal consolidation. There is no pleural effusion or pneumothorax. The heart and mediastinal contours are unremarkable. There is no acute osseous abnormality. IMPRESSION: Left-sided Port-A-Cath with the  tip projecting  over the SVC. Electronically Signed   By: Kathreen Devoid   On: 12/09/2018 19:32   Dg Foot Complete Left  Result Date: 11/17/2018 CLINICAL DATA:  Left navicular pain. No known trauma. EXAM: LEFT FOOT - COMPLETE 3+ VIEW COMPARISON:  None. FINDINGS: There is no fracture or dislocation. Minimal arthritic change at the second MTP joint. Minimal dorsal spurring on the proximal navicular with slight chondrocalcinosis between the navicular and the cuneiforms only seen on the lateral view. The navicular otherwise appears normal. No other abnormalities. IMPRESSION: Minimal arthritic changes as described. Electronically Signed   By: Lorriane Shire M.D.   On: 11/17/2018 16:38   Dg C-arm 1-60 Min-no Report  Result Date: 12/09/2018 Fluoroscopy was utilized by the requesting physician.  No radiographic interpretation.    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  PLAN:   1. CLL-with ATM 11q- mutation and 13q-, anemia and ITP. Repeat bone marrow biopsy on 07/28/2018 with 70% involvement of CLL.  Previous bone marrow biopsy on 10/23/2017 revealed only 31% involvement.  Previously, peripheral blood FISH testing 50% incidence of mutation in the ATM gene which is commonly associated with deletion of 11 q. with an unfavorable prognosis and high risk of not responding to initial treatment.  13 q. is a more common mutation and is actually associated with a more favorable prognosis.  Patient has clear progression of disease in his bone marrow with a worsening transfusion requirement.  He received 5 weekly cycles of Rituxan with mild improvement in his platelet count.  Patient initially received cycle 1 of Rituxan and bendamustine on 09/14/2018-09/15/2018, but given persistent neutropenia, treatment has been delayed.  He received Udenyca with improvement of white count.  Cycle 2 complicated by thrombocytopenia as well and anemia. Labs reviewed in detail today.  Counts acceptable for treatment and patient wishes  to proceed.  Proceed with cycle 3-day 1 Rituxan and Treanda/Bendeka.  Patient will return tomorrow for cycle 3-day 2 with Neulasta Onpro support for prevention of febrile neutropenia is. 2.  Thrombocytopenia-patient has received high-dose prednisone, IVIG, and Rituxan.  Counts declined after cycle 2 and he received prednisone.  Counts improving.  Plts 71,000 today.  He had 1 nosebleed last week but none since.  3.  Anemia-colonoscopy on 11/17/2017 related to nonmalignant polyps.  EGD on that same day revealed nonbleeding erosive gastropathy with multiple nonbleeding duodenal ulcers.  Will consider referral back to GI after completion of chemotherapy.  Hemoglobin 9.3 today.  He has previously required transfusions.  No need for transfusion today.  Continue to monitor. 4.  Reaction to Rituxan-rate based.  Again reviewed premedications for all further infusions with Rituxan.  Orders are in treatment plan.  Patient reports compliance with premeds today. 5.  Cellulitis to right intergluteal cleft-status post I&D with Dr.Pabon along with Bactrim x 10 days.  He is seeing surgery for follow-up. 6.  Epistaxis-resolved.  Continue to monitor in the setting of thrombocytopenia. 7.  Neutropenia-WBC 1.5, ANC 1.0.  Stable.  Proceed with Neulasta with treatment.  We will plan to start on the acyclovir prophylaxis at next visit as he will have tapered off prednisone and completed Bactrim at that time. 8.  Elevated creatinine-serum creatinine 1.27 today.  Improved.  Continue plan for IV fluids with treatment.  Encouraged oral fluids in the interim. 9.  Port-A-Cath-port placed for administration of chemotherapy due to poor IV access.  Again discussed need for ongoing maintenance with regular flushing of port every 8  weeks  Disposition: Return to clinic tomorrow for cycle 3-day 2.  Will check labs (CBC, CMP, hold tube) weekly for possible transfusion.  In 4 weeks labs, follow-up with Dr. [name] in consideration cycle 4-day  1  Patient expressed understanding and was in agreement with this plan. He also understands that He can call clinic at any time with any questions, concerns, or complaints.   Greater than 50% was spent in counseling and coordination of care with this patient including but not limited to discussion of the relevant topics above (See A&P) including, but not limited to diagnosis and management of acute and chronic medical conditions.   Beckey Rutter, DNP, AGNP-[facility] [place] [facility] [place] (work cell) [phone] (office)  Cc: Dr [name]

## 2018-12-14 NOTE — Telephone Encounter (Signed)
°*  STAT* If patient is at the pharmacy, call can be transferred to refill team.   1. Which medications need to be refilled? (please list name of each medication and dose if known)  Carvedilol- pt only have 2 pills left- his appt is on 12-30-18 with Dayna Dunn  2. Which pharmacy/location (including street and city if local pharmacy) is medication to be sent to? [place], [place]     3. Do they need a 30 day or 90 day supply?  Enough until his appt on 12-30-18

## 2018-12-14 NOTE — Progress Notes (Signed)
1110 Rituxan started 1130 pt c/o SOB and feeling flush, drug stopped, benadyrl 25mg  given IVP, IVFs at 200cc/hr, BP stable, o2 sats 99-100%, in touch with Alease Medina, NP for further orders, solumedrol 125mg  given IVP 1300 Rituxin restarted at 50cc/hr without further complication.

## 2018-12-15 ENCOUNTER — Other Ambulatory Visit: Payer: Self-pay

## 2018-12-15 ENCOUNTER — Inpatient Hospital Stay: Payer: Medicare Other

## 2018-12-15 VITALS — BP 146/90 | HR 60 | Temp 97.1°F | Resp 18

## 2018-12-15 DIAGNOSIS — Z5189 Encounter for other specified aftercare: Secondary | ICD-10-CM | POA: Diagnosis not present

## 2018-12-15 DIAGNOSIS — C9110 Chronic lymphocytic leukemia of B-cell type not having achieved remission: Secondary | ICD-10-CM

## 2018-12-15 DIAGNOSIS — Z5111 Encounter for antineoplastic chemotherapy: Secondary | ICD-10-CM | POA: Diagnosis not present

## 2018-12-15 DIAGNOSIS — Z5112 Encounter for antineoplastic immunotherapy: Secondary | ICD-10-CM | POA: Diagnosis not present

## 2018-12-15 DIAGNOSIS — Z452 Encounter for adjustment and management of vascular access device: Secondary | ICD-10-CM | POA: Diagnosis not present

## 2018-12-15 DIAGNOSIS — D693 Immune thrombocytopenic purpura: Secondary | ICD-10-CM | POA: Diagnosis not present

## 2018-12-15 MED ORDER — PEGFILGRASTIM 6 MG/0.6ML ~~LOC~~ PSKT
6.0000 mg | PREFILLED_SYRINGE | Freq: Once | SUBCUTANEOUS | Status: AC
  Administered 2018-12-15: 6 mg via SUBCUTANEOUS
  Filled 2018-12-15: qty 0.6

## 2018-12-15 MED ORDER — SODIUM CHLORIDE 0.9% FLUSH
10.0000 mL | INTRAVENOUS | Status: DC | PRN
  Administered 2018-12-15: 10 mL via INTRAVENOUS
  Filled 2018-12-15: qty 10

## 2018-12-15 MED ORDER — LIDOCAINE-PRILOCAINE 2.5-2.5 % EX CREA: 1.0000 "application " | TOPICAL_CREAM | CUTANEOUS | 2 refills | Status: DC | PRN

## 2018-12-15 MED ORDER — CARVEDILOL 6.25 MG PO TABS: 6.2500 mg | ORAL_TABLET | Freq: Two times a day (BID) | ORAL | 0 refills | Status: DC

## 2018-12-15 MED ORDER — SODIUM CHLORIDE 0.9 % IV SOLN
70.0000 mg/m2 | Freq: Once | INTRAVENOUS | Status: AC
  Administered 2018-12-15: 125 mg via INTRAVENOUS
  Filled 2018-12-15: qty 5

## 2018-12-15 MED ORDER — SODIUM CHLORIDE 0.9 % IV SOLN
Freq: Once | INTRAVENOUS | Status: AC
  Administered 2018-12-15: 14:00:00 via INTRAVENOUS
  Filled 2018-12-15: qty 250

## 2018-12-15 MED ORDER — DEXAMETHASONE SODIUM PHOSPHATE 10 MG/ML IJ SOLN
10.0000 mg | Freq: Once | INTRAMUSCULAR | Status: AC
  Administered 2018-12-15: 10 mg via INTRAVENOUS
  Filled 2018-12-15: qty 1

## 2018-12-15 MED ORDER — HEPARIN SOD (PORK) LOCK FLUSH 100 UNIT/ML IV SOLN
500.0000 [IU] | Freq: Once | INTRAVENOUS | Status: AC
  Administered 2018-12-15: 14:00:00 500 [IU] via INTRAVENOUS
  Filled 2018-12-15: qty 5

## 2018-12-15 NOTE — Telephone Encounter (Signed)
Pt's medication was sent to pt's pharmacy as requested. Confirmation received.  °

## 2018-12-21 ENCOUNTER — Inpatient Hospital Stay: Payer: Medicare Other

## 2018-12-21 ENCOUNTER — Other Ambulatory Visit: Payer: Self-pay

## 2018-12-21 DIAGNOSIS — D693 Immune thrombocytopenic purpura: Secondary | ICD-10-CM | POA: Diagnosis not present

## 2018-12-21 DIAGNOSIS — C9110 Chronic lymphocytic leukemia of B-cell type not having achieved remission: Secondary | ICD-10-CM | POA: Diagnosis not present

## 2018-12-21 DIAGNOSIS — Z5112 Encounter for antineoplastic immunotherapy: Secondary | ICD-10-CM | POA: Diagnosis not present

## 2018-12-21 DIAGNOSIS — Z5189 Encounter for other specified aftercare: Secondary | ICD-10-CM | POA: Diagnosis not present

## 2018-12-21 DIAGNOSIS — Z5111 Encounter for antineoplastic chemotherapy: Secondary | ICD-10-CM | POA: Diagnosis not present

## 2018-12-21 DIAGNOSIS — Z452 Encounter for adjustment and management of vascular access device: Secondary | ICD-10-CM | POA: Diagnosis not present

## 2018-12-21 LAB — CBC WITH DIFFERENTIAL/PLATELET
Abs Immature Granulocytes: 0.08 10*3/uL — ABNORMAL HIGH (ref 0.00–0.07)
Basophils Absolute: 0 10*3/uL (ref 0.0–0.1)
Basophils Relative: 1 %
Eosinophils Absolute: 0 10*3/uL (ref 0.0–0.5)
Eosinophils Relative: 1 %
HCT: 28.8 % — ABNORMAL LOW (ref 39.0–52.0)
Hemoglobin: 9.3 g/dL — ABNORMAL LOW (ref 13.0–17.0)
Immature Granulocytes: 4 %
Lymphocytes Relative: 4 %
Lymphs Abs: 0.1 10*3/uL — ABNORMAL LOW (ref 0.7–4.0)
MCH: 33.6 pg (ref 26.0–34.0)
MCHC: 32.3 g/dL (ref 30.0–36.0)
MCV: 104 fL — ABNORMAL HIGH (ref 80.0–100.0)
Monocytes Absolute: 0.3 10*3/uL (ref 0.1–1.0)
Monocytes Relative: 18 %
Neutro Abs: 1.4 10*3/uL — ABNORMAL LOW (ref 1.7–7.7)
Neutrophils Relative %: 72 %
Platelets: 33 10*3/uL — ABNORMAL LOW (ref 150–400)
RBC: 2.77 MIL/uL — ABNORMAL LOW (ref 4.22–5.81)
RDW: 16.8 % — ABNORMAL HIGH (ref 11.5–15.5)
Smear Review: NORMAL
WBC: 1.9 10*3/uL — ABNORMAL LOW (ref 4.0–10.5)
nRBC: 0 % (ref 0.0–0.2)

## 2018-12-21 LAB — SAMPLE TO BLOOD BANK

## 2018-12-21 MED ORDER — SODIUM CHLORIDE 0.9% FLUSH
10.0000 mL | Freq: Once | INTRAVENOUS | Status: AC
  Administered 2018-12-21: 08:00:00 10 mL via INTRAVENOUS
  Filled 2018-12-21: qty 10

## 2018-12-21 MED ORDER — HEPARIN SOD (PORK) LOCK FLUSH 100 UNIT/ML IV SOLN
500.0000 [IU] | Freq: Once | INTRAVENOUS | Status: AC
  Administered 2018-12-21: 09:00:00 500 [IU] via INTRAVENOUS
  Filled 2018-12-21: qty 5

## 2018-12-21 NOTE — Progress Notes (Signed)
Hemoglobin: 9.3, Platelets: 33,000. MD, Dr. [name], notified. Per MD order: patient does not need pRBCs or Platelet transfusion today. Per MD order: patient may be discharged to home at this time.

## 2018-12-23 ENCOUNTER — Other Ambulatory Visit: Payer: Self-pay | Admitting: Family Medicine

## 2018-12-25 ENCOUNTER — Encounter: Payer: Self-pay | Admitting: Surgery

## 2018-12-25 ENCOUNTER — Ambulatory Visit (INDEPENDENT_AMBULATORY_CARE_PROVIDER_SITE_OTHER): Payer: Self-pay | Admitting: Surgery

## 2018-12-25 ENCOUNTER — Other Ambulatory Visit: Payer: Self-pay

## 2018-12-25 VITALS — BP 157/81 | HR 76 | Temp 97.7°F | Resp 14 | Ht 71.0 in | Wt 154.0 lb

## 2018-12-25 DIAGNOSIS — Z09 Encounter for follow-up examination after completed treatment for conditions other than malignant neoplasm: Secondary | ICD-10-CM

## 2018-12-25 DIAGNOSIS — C9110 Chronic lymphocytic leukemia of B-cell type not having achieved remission: Secondary | ICD-10-CM

## 2018-12-25 NOTE — Progress Notes (Signed)
12/25/2018  HPI: Scott [name] is a 78 y.o. male s/p left subclavian Port-A-Cath placement on 10/7 for chronic lymphocytic leukemia.  Patient presents today for follow-up.  Patient reports that he has been doing very well and has been using already Port-A-Cath with no complications.  Denies any problems with the incision.  Vital signs: BP (!) 157/81   Pulse 76   Temp 97.7 F (36.5 C)   Resp 14   Ht 5\' 11"  (1.803 m)   Wt 154 lb (69.9 kg)   SpO2 95%   BMI 21.48 kg/m    Physical Exam: Constitutional: No acute distress Skin: Patient's left subclavian Port-A-Cath is in place with good healing wound.  There is no evidence of wound breakdown, infection, erythema, induration.  Port is palpable underneath the skin.  There is no evidence of any skin breakdown or ulceration  Assessment/Plan: This is a 78 y.o. male s/p left subclavian Port-A-Cath placement.  -Discussed with the patient that he is healing very well and I do not see any complications from his surgery.  The Dermabond glue will continue falling off on its own. -Patient will follow-up with [place] on an as-needed basis.   Melvyn Neth, [facility]

## 2018-12-25 NOTE — Patient Instructions (Signed)
   Follow-up with our office as needed.  Please call and ask to speak with a nurse if you develop questions or concerns.  

## 2018-12-26 ENCOUNTER — Other Ambulatory Visit: Payer: Self-pay | Admitting: Family Medicine

## 2018-12-26 DIAGNOSIS — E02 Subclinical iodine-deficiency hypothyroidism: Secondary | ICD-10-CM

## 2018-12-28 ENCOUNTER — Other Ambulatory Visit: Payer: Self-pay

## 2018-12-28 ENCOUNTER — Inpatient Hospital Stay: Payer: Medicare Other

## 2018-12-28 ENCOUNTER — Inpatient Hospital Stay: Payer: Medicare Other | Admitting: *Deleted

## 2018-12-28 DIAGNOSIS — D693 Immune thrombocytopenic purpura: Secondary | ICD-10-CM | POA: Diagnosis not present

## 2018-12-28 DIAGNOSIS — Z5111 Encounter for antineoplastic chemotherapy: Secondary | ICD-10-CM | POA: Diagnosis not present

## 2018-12-28 DIAGNOSIS — Z5112 Encounter for antineoplastic immunotherapy: Secondary | ICD-10-CM | POA: Diagnosis not present

## 2018-12-28 DIAGNOSIS — C9110 Chronic lymphocytic leukemia of B-cell type not having achieved remission: Secondary | ICD-10-CM | POA: Diagnosis not present

## 2018-12-28 DIAGNOSIS — Z5189 Encounter for other specified aftercare: Secondary | ICD-10-CM | POA: Diagnosis not present

## 2018-12-28 DIAGNOSIS — Z95828 Presence of other vascular implants and grafts: Secondary | ICD-10-CM

## 2018-12-28 DIAGNOSIS — Z452 Encounter for adjustment and management of vascular access device: Secondary | ICD-10-CM | POA: Diagnosis not present

## 2018-12-28 LAB — CBC WITH DIFFERENTIAL/PLATELET
Abs Immature Granulocytes: 0.07 10*3/uL (ref 0.00–0.07)
Basophils Absolute: 0 10*3/uL (ref 0.0–0.1)
Basophils Relative: 0 %
Eosinophils Absolute: 0 10*3/uL (ref 0.0–0.5)
Eosinophils Relative: 0 %
HCT: 28.3 % — ABNORMAL LOW (ref 39.0–52.0)
Hemoglobin: 9 g/dL — ABNORMAL LOW (ref 13.0–17.0)
Immature Granulocytes: 3 %
Lymphocytes Relative: 7 %
Lymphs Abs: 0.2 10*3/uL — ABNORMAL LOW (ref 0.7–4.0)
MCH: 33.5 pg (ref 26.0–34.0)
MCHC: 31.8 g/dL (ref 30.0–36.0)
MCV: 105.2 fL — ABNORMAL HIGH (ref 80.0–100.0)
Monocytes Absolute: 0.2 10*3/uL (ref 0.1–1.0)
Monocytes Relative: 7 %
Neutro Abs: 2 10*3/uL (ref 1.7–7.7)
Neutrophils Relative %: 83 %
Platelets: 68 10*3/uL — ABNORMAL LOW (ref 150–400)
RBC: 2.69 MIL/uL — ABNORMAL LOW (ref 4.22–5.81)
RDW: 16.4 % — ABNORMAL HIGH (ref 11.5–15.5)
WBC: 2.4 10*3/uL — ABNORMAL LOW (ref 4.0–10.5)
nRBC: 0 % (ref 0.0–0.2)

## 2018-12-28 LAB — COMPREHENSIVE METABOLIC PANEL
ALT: 17 U/L (ref 0–44)
AST: 22 U/L (ref 15–41)
Albumin: 3.4 g/dL — ABNORMAL LOW (ref 3.5–5.0)
Alkaline Phosphatase: 107 U/L (ref 38–126)
Anion gap: 9 (ref 5–15)
BUN: 26 mg/dL — ABNORMAL HIGH (ref 8–23)
CO2: 29 mmol/L (ref 22–32)
Calcium: 8.2 mg/dL — ABNORMAL LOW (ref 8.9–10.3)
Chloride: 106 mmol/L (ref 98–111)
Creatinine, Ser: 1.36 mg/dL — ABNORMAL HIGH (ref 0.61–1.24)
GFR calc Af Amer: 57 mL/min — ABNORMAL LOW (ref >60)
GFR calc non Af Amer: 49 mL/min — ABNORMAL LOW (ref >60)
Glucose, Bld: 147 mg/dL — ABNORMAL HIGH (ref 70–99)
Potassium: 4.1 mmol/L (ref 3.5–5.1)
Sodium: 144 mmol/L (ref 135–145)
Total Bilirubin: 0.6 mg/dL (ref 0.3–1.2)
Total Protein: 5.8 g/dL — ABNORMAL LOW (ref 6.5–8.1)

## 2018-12-28 LAB — SAMPLE TO BLOOD BANK

## 2018-12-28 MED ORDER — SODIUM CHLORIDE 0.9% FLUSH
10.0000 mL | Freq: Once | INTRAVENOUS | Status: AC
  Administered 2018-12-28: 10 mL via INTRAVENOUS
  Filled 2018-12-28: qty 10

## 2018-12-28 MED ORDER — HEPARIN SOD (PORK) LOCK FLUSH 100 UNIT/ML IV SOLN
500.0000 [IU] | Freq: Once | INTRAVENOUS | Status: AC
  Administered 2018-12-28: 09:00:00 500 [IU] via INTRAVENOUS

## 2018-12-28 MED ORDER — HEPARIN SOD (PORK) LOCK FLUSH 100 UNIT/ML IV SOLN
INTRAVENOUS | Status: AC
  Filled 2018-12-28: qty 5

## 2018-12-29 ENCOUNTER — Encounter: Payer: Self-pay | Admitting: Physician Assistant

## 2018-12-29 NOTE — Progress Notes (Signed)
Cardiology Office Note    Date:  12/30/2018   ID:  Rich, Yaeger [birth year], MRN [id number]  PCP:  [name], MD  Cardiologist:  Ena Dawley, MD  Electrophysiologist:  None   Chief Complaint: f/u CAD  History of Present Illness:   Scott [name] is a 78 y.o. male with history of CAD s/p MI s/p remote PTCA (Dx-OM2 proximal concentric stenosis), DM with peripheral nephropathy, CKD stage III (sees CKA), bradycardia, CLL, carotid artery disease by duplex 03/2018 ([id number] RICA, [id number] LICA), chronic mild edema, HTN, orthostasis who presents to clinic for overdue follow-up.  I do not have prior cath records but in his [place] a previous Epic user outlined that he had "Remote PTCA 1994, chronic reocclusion distal right with collaterals from circumflex, and 08/24/1993 PTCA of Dx-OM2 proximal concentric stenosis." He has not had a recent cath. He was doing well until 2013 when he was admitted for chest pain, ruled out for ACS and an exercise stress test was negative for ischemia or prior scar. It showed fixed, small mild apical septal perfusion defect with normal wall motion likely representing apical thinning. Echo was unremarkable for acute cause at that time. Most recent echocardiogram in 10/2013 showed inferior wall hypokinesis, EF 50-55%, mild mitral regurgitation, mild LAE. He was seen again for chest pain in the context of his wife recently passing away. He had a nuclear stress test 11/2015 which was felt to be low risk with mild apical thinning and mild apical ischemia, EF 58 with normal wall motion. He was treated medically at that time. He has since been found to have CLL and is being managed by heme-onc. He was on Lasix in the past as well as several other antihypertensives but they had to be stopped due to low BP - this was also in the context of severely low platelets per his report. His beta blocker was previously reduced due to bradycardia. Aspirin was stopped due to low platelets  and frequent nosebleeds. Last labs 12/2018 showed Hgb 9.0, plt 68 (as low as 33), K 4.1, Cr 1.36, normal AST/ALT, 06/2018 A1C 7.5, TSH elevated but free T4 normal (managed by primary care), 02/2018 LDL 65.   He is seen back for follow-up today doing well from cardiac standpoint without angina, dyspnea, palpitations, orthopnea, syncope or dizziness. He has chronic mild edema which he states is at baseline. He brings in a log of BPs which are quite labile from [id number] systolic, more frequently 150 and above.   Past Medical History:  Diagnosis Date   Acquired hand deformity 1962   hand saw accident at work   Anemia 10/11/2011   Arthritis    Bradycardia 10/10/2011   CAD (coronary artery disease) 10/10/2011   MI s/p PTCA (Dx-OM2 proximal concentric stenosis)   Carotid artery disease (HCC)    CKD (chronic kidney disease) stage 3, GFR 30-59 ml/min    [name]   CLL (chronic lymphocytic leukemia) ([place])    Colon polyps    Generalized headaches    frequent   GI bleed 07/08/2017   Glaucoma    s/p laser surgery   HLD (hyperlipidemia)    Hypertension    Hypothyroidism 10/10/2011   Thrombocytopenia ([place]) 10/11/2011   Type 2 diabetes with nephropathy [facility])    DM refresher course [facility] (04/2013)    Past Surgical History:  Procedure Laterality Date   BACK SURGERY     cervical neck   CATARACT EXTRACTION, BILATERAL Bilateral 2017   COLONOSCOPY  11/2008   1 polyp, diverticulosis, rec rpt 5 yrs (Dr. Oletta Lamas, [name])   COLONOSCOPY  06/2014   hyperplastic polyp, rpt 5 yrs Oletta Lamas)   COLONOSCOPY WITH PROPOFOL N/A 11/17/2017   TA, HP, (Vanga, Tally Due, MD)   ESOPHAGOGASTRODUODENOSCOPY (EGD) WITH PROPOFOL N/A 11/17/2017   healing erosive gastritis, intestinal metaplasia, neg H pylori (Vanga, Tally Due, MD)   EYE SURGERY  2012   laser surgery for glaucoma   PORTACATH PLACEMENT Left 12/09/2018   Procedure: INSERTION PORT-A-CATH;  Surgeon: [name], MD;  Location: [facility] ORS;   Service: General;  Laterality: Left;   PTCA  1994, 1995   US ECHOCARDIOGRAPHY  10/2013   inferior wall hypokinesis, mild LVH, EF 50-55%, mild MR and LA dilation    Current Medications: Current Meds  Medication Sig   acetaminophen (TYLENOL) 650 MG CR tablet Take 1,300 mg by mouth 2 (two) times daily.    atorvastatin (LIPITOR) 80 MG tablet Take 80 mg by mouth daily.   carvedilol (COREG) 6.25 MG tablet Take 1 tablet (6.25 mg total) by mouth 2 (two) times daily with a meal. Please keep upcoming appt in October before anymore refills. Thank you   clotrimazole (LOTRIMIN) 1 % cream Apply 1 application topically 2 (two) times daily.   docusate sodium (COLACE) 100 MG capsule Take 100 mg by mouth daily.   glucose blood (ONE TOUCH ULTRA TEST) test strip 1 each by Other route 4 (four) times daily. and as needed to check sugars Dx code:  E11.21   insulin aspart (NOVOLOG FLEXPEN) 100 UNIT/ML FlexPen Take 5 units with meals, take 10 units if mealtime sugar >250 (Patient taking differently: Inject 5-10 Units into the skin See admin instructions. Take 5 units with meals, take 10 units if mealtime sugar >250)   LEVEMIR FLEXTOUCH 100 UNIT/ML Pen INJECT 25 UNITS SUBCUTANEOUSLY ONCE DAILY AT  10PM   levothyroxine (SYNTHROID) 88 MCG tablet Take 1 tablet by mouth once daily   lidocaine-prilocaine (EMLA) cream Apply 1 application topically as needed. To port site prior to access   nitroGLYCERIN (NITROSTAT) 0.4 MG SL tablet Place 1 tablet (0.4 mg total) under the tongue every 5 (five) minutes as needed for chest pain ((MAX of 3 doses)).   ondansetron (ZOFRAN) 8 MG tablet Take 1 tablet (8 mg total) by mouth 2 (two) times daily as needed for refractory nausea / vomiting.   oxyCODONE (OXY IR/ROXICODONE) 5 MG immediate release tablet Take 1 tablet (5 mg total) by mouth every 4 (four) hours as needed for severe pain.   oxymetazoline (AFRIN) 0.05 % nasal spray Place 1 spray into left nostril daily as needed  (nose bleeds).   prochlorperazine (COMPAZINE) 10 MG tablet Take 1 tablet (10 mg total) by mouth every 6 (six) hours as needed (Nausea or vomiting).   traZODone (DESYREL) 100 MG tablet TAKE 1 TABLET BY MOUTH AT BEDTIME AS NEEDED FOR SLEEP      Allergies:   Patient has no known allergies.   Social History   Socioeconomic History   Marital status: Widowed    Spouse name: Not on file   Number of children: Not on file   Years of education: Not on file   Highest education level: Not on file  Occupational History   Not on file  Social Needs   Financial resource strain: Not on file   Food insecurity    Worry: Not on file    Inability: Not on file   Transportation needs    Medical:  Not on file    Non-medical: Not on file  Tobacco Use   Smoking status: Former Smoker    Quit date: 03/04/1978    Years since quitting: 40.8   Smokeless tobacco: Never Used  Substance and Sexual Activity   Alcohol use: No   Drug use: No   Sexual activity: Never  Lifestyle   Physical activity    Days per week: Not on file    Minutes per session: Not on file   Stress: Not on file  Relationships   Social connections    Talks on phone: Not on file    Gets together: Not on file    Attends religious service: Not on file    Active member of club or organization: Not on file    Attends meetings of clubs or organizations: Not on file    Relationship status: Not on file  Other Topics Concern   Not on file  Social History Narrative   Widower. Wife passed 08/2015 from cancer. 1 dog   Occupation: retired, was route Hotel manager   Edu: 11th gr   Activity: walks dog (pomeranian) 3-4x/day, yardwork, rides bicycle.   Diet: drinks diet coke, no vegetables     Family History:  The patient's family history includes Coronary artery disease (age of onset: 63) in his son; Diabetes in his sister; Hyperlipidemia in his sister; Other in his brother; Pneumonia in his father; Stomach cancer (age of onset:  31) in his sister. There is no history of Stroke or Heart attack.  ROS:   Please see the history of present illness.  All other systems are reviewed and otherwise negative.    EKGs/Labs/Other Studies Reviewed:    Studies reviewed were summarized above.   EKG:  EKG is ordered today, personally reviewed, demonstrating NSR 66bpm, no acute changes  Recent Labs: 06/24/2018: TSH 7.17 12/28/2018: ALT 17; BUN 26; Creatinine, Ser 1.36; Hemoglobin 9.0; Platelets 68; Potassium 4.1; Sodium 144  Recent Lipid Panel    Component Value Date/Time   CHOL 117 02/20/2018 1011   CHOL 126 11/05/2016 0831   CHOL 153 09/04/2011   TRIG 103.0 02/20/2018 1011   TRIG 78 09/04/2011   HDL 31.90 (L) 02/20/2018 1011   HDL 43 11/05/2016 0831   CHOLHDL 4 02/20/2018 1011   VLDL 20.6 02/20/2018 1011   LDLCALC 65 02/20/2018 1011   LDLCALC 68 11/05/2016 0831   LDLDIRECT 84 09/04/2011    PHYSICAL EXAM:    VS:  BP (!) 160/88    Pulse 63    Ht 5\' 11"  (1.803 m)    Wt 153 lb 12.8 oz (69.8 kg)    SpO2 97%    BMI 21.45 kg/m   BMI: Body mass index is 21.45 kg/m.  GEN: Thin well developed pleasant WM, in no acute distress HEENT: normocephalic, atraumatic Neck: no JVD, carotid bruits, or masses Cardiac: RRR; no murmurs, rubs, or gallops, trace BLE edema  Respiratory:  clear to auscultation bilaterally, normal work of breathing GI: soft, nontender, nondistended, + BS MS: generalized atrophy noted Skin: warm and dry, no rash Neuro:  Alert and Oriented x 3, Strength and sensation are intact, follows commands Psych: euthymic mood, full affect  Wt Readings from Last 3 Encounters:  12/30/18 153 lb 12.8 oz (69.8 kg)  12/25/18 154 lb (69.9 kg)  12/14/18 151 lb 4.8 oz (68.6 kg)     ASSESSMENT & PLAN:   1. CAD - no recent angina. No longer on ASA due to low platelets and  nosebleeds in context of CLL. He had bradycardia on higher doses of coreg but is tolerating current dose well. Continue statin. Lipids followed by  primary care. 2. History of bradycardia - resolved on lower dose of carvedilol. 3. Essential HTN - labile. CKD present as well (he's not sure who he sees at [facility] for this but goes there 2x/year). Will restart amlodipine at 5mg  daily. He indicates he has several bottles of this at home already so did not want rx. He will check expiration date and call if there is any issue. His PCP traditionally manages his blood pressure changes but he knew he was coming in for visit today. I do not necessarily think he has any other active cardiac issues requiring ongoing specialist visit so I recommended he f/u with PCP in 1 week for BP recheck. At one point he was on ACEI as well but given his CKD and ongoing pandemic, I will defer to primary care to decide if still needed after amlodipine initiation since he also has a physical with them later this year. Watch for worsening edema on amlodipine but it sounds like in the past this did not particularly worsen with CCB. 4. Carotid artery disease - I offered to order updated study for him for 03/2019 but he would like PCP to manage. 5. Mild mitral regurgitation - no symptoms to suggest progression. Given comorbidities, would follow conservatively by symptom surveillance yearly.  Disposition: F/u with Dr. Meda Coffee in 1 year.  Medication Adjustments/Labs and Tests Ordered: Current medicines are reviewed at length with the patient today.  Concerns regarding medicines are outlined above. Medication changes, Labs and Tests ordered today are summarized above and listed in the Patient Instructions accessible in Encounters.   Signed, Charlie Pitter, PA-C  12/30/2018 11:54 AM    [address], [place], [place]  [address] Phone: [phone]; Fax: [phone]

## 2018-12-30 ENCOUNTER — Ambulatory Visit (INDEPENDENT_AMBULATORY_CARE_PROVIDER_SITE_OTHER): Payer: Medicare Other | Admitting: Physician Assistant

## 2018-12-30 ENCOUNTER — Encounter: Payer: Self-pay | Admitting: Physician Assistant

## 2018-12-30 ENCOUNTER — Other Ambulatory Visit: Payer: Self-pay | Admitting: Nurse Practitioner

## 2018-12-30 ENCOUNTER — Other Ambulatory Visit: Payer: Self-pay

## 2018-12-30 VITALS — BP 160/88 | HR 63 | Ht 71.0 in | Wt 153.8 lb

## 2018-12-30 DIAGNOSIS — I10 Essential (primary) hypertension: Secondary | ICD-10-CM | POA: Diagnosis not present

## 2018-12-30 DIAGNOSIS — I2510 Atherosclerotic heart disease of native coronary artery without angina pectoris: Secondary | ICD-10-CM | POA: Diagnosis not present

## 2018-12-30 DIAGNOSIS — I340 Nonrheumatic mitral (valve) insufficiency: Secondary | ICD-10-CM

## 2018-12-30 DIAGNOSIS — Z87898 Personal history of other specified conditions: Secondary | ICD-10-CM

## 2018-12-30 DIAGNOSIS — I6523 Occlusion and stenosis of bilateral carotid arteries: Secondary | ICD-10-CM

## 2018-12-30 MED ORDER — AMLODIPINE BESYLATE 5 MG PO TABS: 5.0000 mg | ORAL_TABLET | Freq: Every day | ORAL | 3 refills | Status: DC

## 2018-12-30 NOTE — Telephone Encounter (Signed)
Pt's medication was sent to pt's pharmacy as requested. Confirmation received.  °

## 2018-12-30 NOTE — Patient Instructions (Signed)
Medication Instructions:  Your physician has recommended you make the following change in your medication:  1.  RESTART  Amlodipine 5 mg daily.  FOLLOW UP WITH YOUR PRIMARY CARE DR IN 1 WEEK FOR BLOOD PRESSURE CHECK  *If you need a refill on your cardiac medications before your next appointment, please call your pharmacy*  Lab Work: None ordered  If you have labs (blood work) drawn today and your tests are completely normal, you will receive your results only by: Marland Kitchen MyChart Message (if you have MyChart) OR . A paper copy in the mail If you have any lab test that is abnormal or we need to change your treatment, we will call you to review the results.  Testing/Procedures: None ordered  Follow-Up: At [facility], you and your health needs are our priority.  As part of our continuing mission to provide you with exceptional heart care, we have created designated Provider Care Teams.  These Care Teams include your primary Cardiologist (physician) and Advanced Practice Providers (APPs -  Physician Assistants and Nurse Practitioners) who all work together to provide you with the care you need, when you need it.  Your next appointment:   12 months  The format for your next appointment:   In Person  Provider:   Ena Dawley, MD  Other Instructions

## 2019-01-04 ENCOUNTER — Other Ambulatory Visit: Payer: Self-pay

## 2019-01-04 ENCOUNTER — Inpatient Hospital Stay: Payer: Medicare Other

## 2019-01-04 ENCOUNTER — Inpatient Hospital Stay: Payer: Medicare Other | Attending: Oncology

## 2019-01-04 DIAGNOSIS — Z794 Long term (current) use of insulin: Secondary | ICD-10-CM | POA: Insufficient documentation

## 2019-01-04 DIAGNOSIS — D649 Anemia, unspecified: Secondary | ICD-10-CM | POA: Insufficient documentation

## 2019-01-04 DIAGNOSIS — C9110 Chronic lymphocytic leukemia of B-cell type not having achieved remission: Secondary | ICD-10-CM | POA: Insufficient documentation

## 2019-01-04 DIAGNOSIS — E1122 Type 2 diabetes mellitus with diabetic chronic kidney disease: Secondary | ICD-10-CM | POA: Diagnosis not present

## 2019-01-04 DIAGNOSIS — Z5112 Encounter for antineoplastic immunotherapy: Secondary | ICD-10-CM | POA: Diagnosis not present

## 2019-01-04 DIAGNOSIS — N189 Chronic kidney disease, unspecified: Secondary | ICD-10-CM | POA: Diagnosis not present

## 2019-01-04 DIAGNOSIS — I129 Hypertensive chronic kidney disease with stage 1 through stage 4 chronic kidney disease, or unspecified chronic kidney disease: Secondary | ICD-10-CM | POA: Diagnosis not present

## 2019-01-04 DIAGNOSIS — Z5189 Encounter for other specified aftercare: Secondary | ICD-10-CM | POA: Insufficient documentation

## 2019-01-04 DIAGNOSIS — D693 Immune thrombocytopenic purpura: Secondary | ICD-10-CM | POA: Diagnosis not present

## 2019-01-04 DIAGNOSIS — Z5111 Encounter for antineoplastic chemotherapy: Secondary | ICD-10-CM | POA: Diagnosis not present

## 2019-01-04 DIAGNOSIS — Z87891 Personal history of nicotine dependence: Secondary | ICD-10-CM | POA: Diagnosis not present

## 2019-01-04 DIAGNOSIS — Z79899 Other long term (current) drug therapy: Secondary | ICD-10-CM | POA: Insufficient documentation

## 2019-01-04 DIAGNOSIS — R531 Weakness: Secondary | ICD-10-CM | POA: Insufficient documentation

## 2019-01-04 DIAGNOSIS — Z452 Encounter for adjustment and management of vascular access device: Secondary | ICD-10-CM | POA: Diagnosis not present

## 2019-01-04 DIAGNOSIS — E785 Hyperlipidemia, unspecified: Secondary | ICD-10-CM | POA: Insufficient documentation

## 2019-01-04 DIAGNOSIS — R5383 Other fatigue: Secondary | ICD-10-CM | POA: Diagnosis not present

## 2019-01-04 LAB — CBC WITH DIFFERENTIAL/PLATELET
Abs Immature Granulocytes: 0.09 10*3/uL — ABNORMAL HIGH (ref 0.00–0.07)
Basophils Absolute: 0 10*3/uL (ref 0.0–0.1)
Basophils Relative: 0 %
Eosinophils Absolute: 0.1 10*3/uL (ref 0.0–0.5)
Eosinophils Relative: 2 %
HCT: 27.7 % — ABNORMAL LOW (ref 39.0–52.0)
Hemoglobin: 8.7 g/dL — ABNORMAL LOW (ref 13.0–17.0)
Immature Granulocytes: 3 %
Lymphocytes Relative: 9 %
Lymphs Abs: 0.3 10*3/uL — ABNORMAL LOW (ref 0.7–4.0)
MCH: 33 pg (ref 26.0–34.0)
MCHC: 31.4 g/dL (ref 30.0–36.0)
MCV: 104.9 fL — ABNORMAL HIGH (ref 80.0–100.0)
Monocytes Absolute: 0.3 10*3/uL (ref 0.1–1.0)
Monocytes Relative: 11 %
Neutro Abs: 2.2 10*3/uL (ref 1.7–7.7)
Neutrophils Relative %: 75 %
Platelets: 92 10*3/uL — ABNORMAL LOW (ref 150–400)
RBC: 2.64 MIL/uL — ABNORMAL LOW (ref 4.22–5.81)
RDW: 15.5 % (ref 11.5–15.5)
WBC: 3 10*3/uL — ABNORMAL LOW (ref 4.0–10.5)
nRBC: 0 % (ref 0.0–0.2)

## 2019-01-04 LAB — COMPREHENSIVE METABOLIC PANEL
ALT: 15 U/L (ref 0–44)
AST: 22 U/L (ref 15–41)
Albumin: 3.5 g/dL (ref 3.5–5.0)
Alkaline Phosphatase: 96 U/L (ref 38–126)
Anion gap: 9 (ref 5–15)
BUN: 32 mg/dL — ABNORMAL HIGH (ref 8–23)
CO2: 27 mmol/L (ref 22–32)
Calcium: 9 mg/dL (ref 8.9–10.3)
Chloride: 106 mmol/L (ref 98–111)
Creatinine, Ser: 1.41 mg/dL — ABNORMAL HIGH (ref 0.61–1.24)
GFR calc Af Amer: 55 mL/min — ABNORMAL LOW (ref >60)
GFR calc non Af Amer: 47 mL/min — ABNORMAL LOW (ref >60)
Glucose, Bld: 144 mg/dL — ABNORMAL HIGH (ref 70–99)
Potassium: 4.4 mmol/L (ref 3.5–5.1)
Sodium: 142 mmol/L (ref 135–145)
Total Bilirubin: 0.5 mg/dL (ref 0.3–1.2)
Total Protein: 6.1 g/dL — ABNORMAL LOW (ref 6.5–8.1)

## 2019-01-04 LAB — SAMPLE TO BLOOD BANK

## 2019-01-04 MED ORDER — HEPARIN SOD (PORK) LOCK FLUSH 100 UNIT/ML IV SOLN
INTRAVENOUS | Status: AC
  Filled 2019-01-04: qty 5

## 2019-01-04 MED ORDER — SODIUM CHLORIDE 0.9% FLUSH
10.0000 mL | Freq: Once | INTRAVENOUS | Status: AC
  Administered 2019-01-04: 09:00:00 10 mL via INTRAVENOUS
  Filled 2019-01-04: qty 10

## 2019-01-04 MED ORDER — HEPARIN SOD (PORK) LOCK FLUSH 100 UNIT/ML IV SOLN
500.0000 [IU] | Freq: Once | INTRAVENOUS | Status: AC
  Administered 2019-01-04: 500 [IU] via INTRAVENOUS

## 2019-01-04 NOTE — Progress Notes (Signed)
Labs reviewed with MD and treatment team. Verbal order that pt does not need a blood or PLT transfusion today and may be discharged. Pt updated and all questions answered at this time.   Isair Inabinet [employer]

## 2019-01-08 NOTE — Progress Notes (Signed)
[place]  [phone] [phone]) [phone]  ID: Scott [name] OB: 05/12/1940  MR#: [id number]  WKM#:[id number]  Patient Care Team: [name], MD as PCP - General (Family Medicine) Dorothy Spark, MD as PCP - Cardiology (Cardiology) Lloyd Huger, MD as Consulting Physician (Oncology) Clent Jacks, MD as Consulting Physician (Ophthalmology) Dorothy Spark, MD as Consulting Physician (Cardiology) Madelon Lips, MD as Consulting Physician (Nephrology)  CHIEF COMPLAINT: 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  INTERVAL HISTORY: Patient returns to clinic today for further evaluation and consideration of cycle 4, day 1 of Treanda and Rituxan.  He continues to have chronic weakness and fatigue, but states this has significantly improved.  He currently feels well and is asymptomatic. He does not complain of any easy bleeding or bruising. He denies any recent fevers or illnesses.  He denies any dizziness or other neurologic complaints. He has a good appetite and denies weight loss. He denies any night sweats. He has noted no lymphadenopathy.  He has no chest pain, shortness of breath, cough, or hemoptysis.  He denies any nausea, vomiting, constipation, or diarrhea. He has no urinary complaints.  Patient offers no further specific complaints today.  REVIEW OF SYSTEMS:   Review of Systems  Constitutional: Positive for malaise/fatigue. Negative for fever and weight loss.  HENT: Negative.  Negative for nosebleeds.   Respiratory: Negative.  Negative for cough and shortness of breath.   Cardiovascular: Negative.  Negative for chest pain and leg swelling.  Gastrointestinal: Negative.  Negative for abdominal pain, blood in stool and melena.  Genitourinary: Negative.  Negative for dysuria and hematuria.  Musculoskeletal: Negative.  Negative for back pain and neck pain.  Skin: Negative.  Negative for itching and rash.  Neurological: Negative.   Negative for dizziness, sensory change, focal weakness, weakness and headaches.  Endo/Heme/Allergies: Negative.  Does not bruise/bleed easily.  Psychiatric/Behavioral: Negative.  The patient is not nervous/anxious.     As per HPI. Otherwise, a complete review of systems is negative.  PAST MEDICAL HISTORY: Past Medical History:  Diagnosis Date  . Acquired hand deformity 1962   hand saw accident at work  . Anemia 10/11/2011  . Arthritis   . Bradycardia 10/10/2011  . CAD (coronary artery disease) 10/10/2011   MI s/p PTCA (Dx-OM2 proximal concentric stenosis)  . Carotid artery disease ([place])   . Chronic edema   . CKD (chronic kidney disease) stage 3, GFR 30-59 ml/min    [name]  . CLL (chronic lymphocytic leukemia) ([place])   . Colon polyps   . Generalized headaches    frequent  . GI bleed 07/08/2017  . Glaucoma    s/p laser surgery  . HLD (hyperlipidemia)   . Hypertension   . Hypothyroidism 10/10/2011  . Thrombocytopenia ([place]) 10/11/2011  . Type 2 diabetes with nephropathy [facility])    DM refresher course [facility] (04/2013)    PAST SURGICAL HISTORY: Past Surgical History:  Procedure Laterality Date  . BACK SURGERY     cervical neck  . CATARACT EXTRACTION, BILATERAL Bilateral 2017  . COLONOSCOPY  11/2008   1 polyp, diverticulosis, rec rpt 5 yrs (Dr. Oletta Lamas, [name])  . COLONOSCOPY  06/2014   hyperplastic polyp, rpt 5 yrs ([place])  . COLONOSCOPY WITH PROPOFOL N/A 11/17/2017   TA, HP, (Vanga, Tally Due, MD)  . ESOPHAGOGASTRODUODENOSCOPY (EGD) WITH PROPOFOL N/A 11/17/2017   healing erosive gastritis, intestinal metaplasia, neg H pylori (Vanga, Tally Due, MD)  . EYE SURGERY  2012  laser surgery for glaucoma  . PORTACATH PLACEMENT Left 12/09/2018   Procedure: INSERTION PORT-A-CATH;  Surgeon: [name], MD;  Location: [facility] ORS;  Service: General;  Laterality: Left;  . PTCA  1994, 1995  . US ECHOCARDIOGRAPHY  10/2013   inferior wall hypokinesis, mild LVH, EF 50-55%, mild MR and LA dilation     FAMILY HISTORY: Family History  Problem Relation Age of Onset  . Diabetes Sister   . Stomach cancer Sister 71  . Pneumonia Father        caused death  . Other Brother        no communication with brother so unsure of any health conditions  . Coronary artery disease Son 44       5v CABG and stents  . Hyperlipidemia Sister   . Stroke Neg Hx   . Heart attack Neg Hx     ADVANCED DIRECTIVES (Y/N):  N  HEALTH MAINTENANCE: Social History   Tobacco Use  . Smoking status: Former Smoker    Quit date: 03/04/1978    Years since quitting: 40.8  . Smokeless tobacco: Never Used  Substance Use Topics  . Alcohol use: No  . Drug use: No     Colonoscopy:  PAP:  Bone density:  Lipid panel:  No Known Allergies  Current Outpatient Medications  Medication Sig Dispense Refill  . acetaminophen (TYLENOL) 650 MG CR tablet Take 1,300 mg by mouth 2 (two) times daily.     Marland Kitchen amLODipine (NORVASC) 5 MG tablet Take 1 tablet (5 mg total) by mouth daily. 90 tablet 3  . atorvastatin (LIPITOR) 80 MG tablet Take 80 mg by mouth daily.    . carvedilol (COREG) 6.25 MG tablet Take 1 tablet (6.25 mg total) by mouth 2 (two) times daily with a meal. Please keep upcoming appt in October before anymore refills. Thank you 60 tablet 0  . clotrimazole (LOTRIMIN) 1 % cream Apply 1 application topically 2 (two) times daily. 80 g 0  . docusate sodium (COLACE) 100 MG capsule Take 100 mg by mouth daily.    Marland Kitchen glucose blood (ONE TOUCH ULTRA TEST) test strip 1 each by Other route 4 (four) times daily. and as needed to check sugars Dx code:  E11.21 400 each 3  . insulin aspart (NOVOLOG FLEXPEN) 100 UNIT/ML FlexPen Take 5 units with meals, take 10 units if mealtime sugar >250 (Patient taking differently: Inject 5-10 Units into the skin See admin instructions. Take 5 units with meals, take 10 units if mealtime sugar >250) 15 mL 11  . LEVEMIR FLEXTOUCH 100 UNIT/ML Pen INJECT 25 UNITS SUBCUTANEOUSLY ONCE DAILY AT  10PM 15 mL 0   . levothyroxine (SYNTHROID) 88 MCG tablet Take 1 tablet by mouth once daily 90 tablet 0  . lidocaine-prilocaine (EMLA) cream Apply 1 application topically as needed. To port site prior to access 30 g 2  . nitroGLYCERIN (NITROSTAT) 0.4 MG SL tablet Place 1 tablet (0.4 mg total) under the tongue every 5 (five) minutes as needed for chest pain ((MAX of 3 doses)). 25 tablet 0  . ondansetron (ZOFRAN) 8 MG tablet Take 1 tablet (8 mg total) by mouth 2 (two) times daily as needed for refractory nausea / vomiting. 30 tablet 2  . oxyCODONE (OXY IR/ROXICODONE) 5 MG immediate release tablet Take 1 tablet (5 mg total) by mouth every 4 (four) hours as needed for severe pain. 20 tablet 0  . oxymetazoline (AFRIN) 0.05 % nasal spray Place 1 spray into left nostril daily  as needed (nose bleeds).    . prochlorperazine (COMPAZINE) 10 MG tablet Take 1 tablet (10 mg total) by mouth every 6 (six) hours as needed (Nausea or vomiting). 60 tablet 2  . traZODone (DESYREL) 100 MG tablet TAKE 1 TABLET BY MOUTH AT BEDTIME AS NEEDED FOR SLEEP 90 tablet 0   No current facility-administered medications for this visit.     OBJECTIVE: Vitals:   01/11/19 0835  BP: (!) 142/78  Pulse: 68  Resp: 16  Temp: (!) 94.9 F (34.9 C)  SpO2: 100%     Body mass index is 21.45 kg/m.    ECOG FS:0 - Asymptomatic  General: Well-developed, well-nourished, no acute distress. Eyes: Pink conjunctiva, anicteric sclera. HEENT: Normocephalic, moist mucous membranes. Lungs: Clear to auscultation bilaterally. Heart: Regular rate and rhythm. No rubs, murmurs, or gallops. Abdomen: Soft, nontender, nondistended. No organomegaly noted, normoactive bowel sounds. Musculoskeletal: No edema, cyanosis, or clubbing. Neuro: Alert, answering all questions appropriately. Cranial nerves grossly intact. Skin: No rashes or petechiae noted. Psych: Normal affect.  LAB RESULTS:  Lab Results  Component Value Date   NA 140 01/11/2019   K 4.0 01/11/2019    CL 106 01/11/2019   CO2 27 01/11/2019   GLUCOSE 263 (H) 01/11/2019   BUN 32 (H) 01/11/2019   CREATININE 1.38 (H) 01/11/2019   CALCIUM 8.7 (L) 01/11/2019   PROT 6.2 (L) 01/11/2019   ALBUMIN 3.5 01/11/2019   AST 21 01/11/2019   ALT 16 01/11/2019   ALKPHOS 95 01/11/2019   BILITOT 0.6 01/11/2019   GFRNONAA 49 (L) 01/11/2019   GFRAA 56 (L) 01/11/2019    Lab Results  Component Value Date   WBC 2.8 (L) 01/11/2019   NEUTROABS 2.1 01/11/2019   HGB 8.5 (L) 01/11/2019   HCT 26.2 (L) 01/11/2019   MCV 102.3 (H) 01/11/2019   PLT 90 (L) 01/11/2019     STUDIES: No results found.  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  PLAN:    1. CLL: Repeat bone marrow biopsy on Jul 28, 2018 reviewed independently with 73% involvement of CLL.  Previous bone marrow biopsy on October 23, 2017 revealed only 31% involvement.  Previously, peripheral blood FISH testing 50% incidence of mutation in the ATM gene which is commonly associated with deletion 11q. 11q- is associated with an unfavorable prognosis and high risk of not responding to initial treatment. 13q- is a more common mutation and is actually associated with a more favorable prognosis. Patient has clear progression of disease in his bone marrow along with a worsening transfusion requirement.  He received 5 weekly cycles of Rituxan with mild improvement of his platelet count.  Proceed with cycle 4, day 1 of Rituxan and Treanda today.  Return to clinic tomorrow for Treanda and On-pro Neulasta.  Patient will then return to clinic in 1, 2, and 4 weeks for laboratory work only and consideration of blood transfusion.  He will then return to clinic in 6 weeks for further evaluation.  2.  Thrombocytopenia: Significantly improved.  Patient has received high-dose prednisone, IVIG, and Rituxan with no appreciable durability to improve his platelet count.   3.  Anemia: Decreased, but essentially stable at 8.5.  Patient has not required a blood  transfusion since December 03, 2018.  Continue to monitor as above. Patient had colonoscopy on November 17, 2017 that removed 2 nonmalignant polyps.  EGD on the same day revealed nonbleeding erosive gastropathy with multiple nonbleeding duodenal ulcers.  Consider referral back to GI  after chemotherapy is complete.  4.  Reaction to Rituxan: Rate-based.  Patient will require premedications for any further infusions with Rituxan.  These have been entered into his treatment plan. 5.  Easy bruising: Resolved. 6.  Nosebleeds: Resolved.  Patient was previously given a referral back to ENT for further evaluation. 7.  Rash: Resolved. 8.  Neutropenia: Continue with OnPro Neulasta on day 2 of each cycle.   I spent a total of 30 minutes face-to-face with the patient of which greater than 50% of the visit was spent in counseling and coordination of care as detailed above.   Patient expressed understanding and was in agreement with this plan. He also understands that He can call clinic at any time with any questions, concerns, or complaints.    Lloyd Huger, MD   01/11/2019 9:14 AM

## 2019-01-11 ENCOUNTER — Inpatient Hospital Stay (HOSPITAL_BASED_OUTPATIENT_CLINIC_OR_DEPARTMENT_OTHER): Payer: Medicare Other | Admitting: Oncology

## 2019-01-11 ENCOUNTER — Inpatient Hospital Stay: Payer: Medicare Other

## 2019-01-11 ENCOUNTER — Other Ambulatory Visit: Payer: Self-pay

## 2019-01-11 VITALS — BP 141/70 | HR 73 | Temp 97.0°F | Resp 18

## 2019-01-11 VITALS — BP 142/78 | HR 68 | Temp 94.9°F | Resp 16 | Wt 153.8 lb

## 2019-01-11 DIAGNOSIS — I6523 Occlusion and stenosis of bilateral carotid arteries: Secondary | ICD-10-CM

## 2019-01-11 DIAGNOSIS — E785 Hyperlipidemia, unspecified: Secondary | ICD-10-CM | POA: Diagnosis not present

## 2019-01-11 DIAGNOSIS — D649 Anemia, unspecified: Secondary | ICD-10-CM | POA: Diagnosis not present

## 2019-01-11 DIAGNOSIS — Z95828 Presence of other vascular implants and grafts: Secondary | ICD-10-CM

## 2019-01-11 DIAGNOSIS — C9110 Chronic lymphocytic leukemia of B-cell type not having achieved remission: Secondary | ICD-10-CM | POA: Diagnosis not present

## 2019-01-11 DIAGNOSIS — D693 Immune thrombocytopenic purpura: Secondary | ICD-10-CM | POA: Diagnosis not present

## 2019-01-11 DIAGNOSIS — R5383 Other fatigue: Secondary | ICD-10-CM | POA: Diagnosis not present

## 2019-01-11 DIAGNOSIS — R531 Weakness: Secondary | ICD-10-CM | POA: Diagnosis not present

## 2019-01-11 LAB — CBC WITH DIFFERENTIAL/PLATELET
Abs Immature Granulocytes: 0.03 10*3/uL (ref 0.00–0.07)
Basophils Absolute: 0 10*3/uL (ref 0.0–0.1)
Basophils Relative: 1 %
Eosinophils Absolute: 0.1 10*3/uL (ref 0.0–0.5)
Eosinophils Relative: 2 %
HCT: 26.2 % — ABNORMAL LOW (ref 39.0–52.0)
Hemoglobin: 8.5 g/dL — ABNORMAL LOW (ref 13.0–17.0)
Immature Granulocytes: 1 %
Lymphocytes Relative: 9 %
Lymphs Abs: 0.2 10*3/uL — ABNORMAL LOW (ref 0.7–4.0)
MCH: 33.2 pg (ref 26.0–34.0)
MCHC: 32.4 g/dL (ref 30.0–36.0)
MCV: 102.3 fL — ABNORMAL HIGH (ref 80.0–100.0)
Monocytes Absolute: 0.3 10*3/uL (ref 0.1–1.0)
Monocytes Relative: 11 %
Neutro Abs: 2.1 10*3/uL (ref 1.7–7.7)
Neutrophils Relative %: 76 %
Platelets: 90 10*3/uL — ABNORMAL LOW (ref 150–400)
RBC: 2.56 MIL/uL — ABNORMAL LOW (ref 4.22–5.81)
RDW: 14.9 % (ref 11.5–15.5)
WBC: 2.8 10*3/uL — ABNORMAL LOW (ref 4.0–10.5)
nRBC: 0 % (ref 0.0–0.2)

## 2019-01-11 LAB — COMPREHENSIVE METABOLIC PANEL
ALT: 16 U/L (ref 0–44)
AST: 21 U/L (ref 15–41)
Albumin: 3.5 g/dL (ref 3.5–5.0)
Alkaline Phosphatase: 95 U/L (ref 38–126)
Anion gap: 7 (ref 5–15)
BUN: 32 mg/dL — ABNORMAL HIGH (ref 8–23)
CO2: 27 mmol/L (ref 22–32)
Calcium: 8.7 mg/dL — ABNORMAL LOW (ref 8.9–10.3)
Chloride: 106 mmol/L (ref 98–111)
Creatinine, Ser: 1.38 mg/dL — ABNORMAL HIGH (ref 0.61–1.24)
GFR calc Af Amer: 56 mL/min — ABNORMAL LOW (ref >60)
GFR calc non Af Amer: 49 mL/min — ABNORMAL LOW (ref >60)
Glucose, Bld: 263 mg/dL — ABNORMAL HIGH (ref 70–99)
Potassium: 4 mmol/L (ref 3.5–5.1)
Sodium: 140 mmol/L (ref 135–145)
Total Bilirubin: 0.6 mg/dL (ref 0.3–1.2)
Total Protein: 6.2 g/dL — ABNORMAL LOW (ref 6.5–8.1)

## 2019-01-11 LAB — SAMPLE TO BLOOD BANK

## 2019-01-11 MED ORDER — DIPHENHYDRAMINE HCL 25 MG PO CAPS
25.0000 mg | ORAL_CAPSULE | Freq: Once | ORAL | Status: AC
  Administered 2019-01-11: 25 mg via ORAL
  Filled 2019-01-11: qty 1

## 2019-01-11 MED ORDER — ACETAMINOPHEN 325 MG PO TABS
650.0000 mg | ORAL_TABLET | Freq: Once | ORAL | Status: AC
  Administered 2019-01-11: 650 mg via ORAL
  Filled 2019-01-11: qty 2

## 2019-01-11 MED ORDER — DEXAMETHASONE SODIUM PHOSPHATE 10 MG/ML IJ SOLN
10.0000 mg | Freq: Once | INTRAMUSCULAR | Status: AC
  Administered 2019-01-11: 10 mg via INTRAVENOUS
  Filled 2019-01-11: qty 1

## 2019-01-11 MED ORDER — SODIUM CHLORIDE 0.9 % IV SOLN
70.0000 mg/m2 | Freq: Once | INTRAVENOUS | Status: AC
  Administered 2019-01-11: 125 mg via INTRAVENOUS
  Filled 2019-01-11: qty 5

## 2019-01-11 MED ORDER — SODIUM CHLORIDE 0.9 % IV SOLN
Freq: Once | INTRAVENOUS | Status: AC
  Administered 2019-01-11: 10:00:00 via INTRAVENOUS
  Filled 2019-01-11: qty 250

## 2019-01-11 MED ORDER — PALONOSETRON HCL INJECTION 0.25 MG/5ML
0.2500 mg | Freq: Once | INTRAVENOUS | Status: AC
  Administered 2019-01-11: 0.25 mg via INTRAVENOUS

## 2019-01-11 MED ORDER — SODIUM CHLORIDE 0.9 % IV SOLN
375.0000 mg/m2 | Freq: Once | INTRAVENOUS | Status: AC
  Administered 2019-01-11: 700 mg via INTRAVENOUS
  Filled 2019-01-11: qty 50

## 2019-01-11 MED ORDER — SODIUM CHLORIDE 0.9% FLUSH
10.0000 mL | Freq: Once | INTRAVENOUS | Status: AC
  Administered 2019-01-11: 10 mL via INTRAVENOUS
  Filled 2019-01-11: qty 10

## 2019-01-11 MED ORDER — HEPARIN SOD (PORK) LOCK FLUSH 100 UNIT/ML IV SOLN
500.0000 [IU] | Freq: Once | INTRAVENOUS | Status: AC | PRN
  Administered 2019-01-11: 500 [IU]
  Filled 2019-01-11 (×2): qty 5

## 2019-01-11 NOTE — Progress Notes (Signed)
Pt tolerated infusion well with no signs of complications or signs of reaction. RN educated pt on the importance of notifying the clinic if complications arises or if it is an emergency to call 911. Pt verbalized understanding. All questions answered at this time. VSS and pt stable for discharge.   Willman Cuny [employer]

## 2019-01-11 NOTE — Progress Notes (Signed)
Patient is here for follow up he is doing well no complaints  

## 2019-01-12 ENCOUNTER — Other Ambulatory Visit: Payer: Self-pay

## 2019-01-12 ENCOUNTER — Inpatient Hospital Stay: Payer: Medicare Other

## 2019-01-12 ENCOUNTER — Other Ambulatory Visit: Payer: Self-pay | Admitting: Physician Assistant

## 2019-01-12 ENCOUNTER — Other Ambulatory Visit: Payer: Self-pay | Admitting: Family Medicine

## 2019-01-12 VITALS — BP 136/66 | HR 68 | Temp 96.3°F | Resp 18

## 2019-01-12 DIAGNOSIS — I2510 Atherosclerotic heart disease of native coronary artery without angina pectoris: Secondary | ICD-10-CM

## 2019-01-12 DIAGNOSIS — D693 Immune thrombocytopenic purpura: Secondary | ICD-10-CM | POA: Diagnosis not present

## 2019-01-12 DIAGNOSIS — R5383 Other fatigue: Secondary | ICD-10-CM | POA: Diagnosis not present

## 2019-01-12 DIAGNOSIS — D649 Anemia, unspecified: Secondary | ICD-10-CM | POA: Diagnosis not present

## 2019-01-12 DIAGNOSIS — I10 Essential (primary) hypertension: Secondary | ICD-10-CM

## 2019-01-12 DIAGNOSIS — C9110 Chronic lymphocytic leukemia of B-cell type not having achieved remission: Secondary | ICD-10-CM | POA: Diagnosis not present

## 2019-01-12 DIAGNOSIS — E785 Hyperlipidemia, unspecified: Secondary | ICD-10-CM

## 2019-01-12 DIAGNOSIS — R531 Weakness: Secondary | ICD-10-CM | POA: Diagnosis not present

## 2019-01-12 DIAGNOSIS — E7849 Other hyperlipidemia: Secondary | ICD-10-CM

## 2019-01-12 MED ORDER — HEPARIN SOD (PORK) LOCK FLUSH 100 UNIT/ML IV SOLN
500.0000 [IU] | Freq: Once | INTRAVENOUS | Status: AC | PRN
  Administered 2019-01-12: 500 [IU]
  Filled 2019-01-12: qty 5

## 2019-01-12 MED ORDER — PEGFILGRASTIM 6 MG/0.6ML ~~LOC~~ PSKT
6.0000 mg | PREFILLED_SYRINGE | Freq: Once | SUBCUTANEOUS | Status: AC
  Administered 2019-01-12: 6 mg via SUBCUTANEOUS
  Filled 2019-01-12: qty 0.6

## 2019-01-12 MED ORDER — SODIUM CHLORIDE 0.9 % IV SOLN
70.0000 mg/m2 | Freq: Once | INTRAVENOUS | Status: AC
  Administered 2019-01-12: 125 mg via INTRAVENOUS
  Filled 2019-01-12: qty 5

## 2019-01-12 MED ORDER — SODIUM CHLORIDE 0.9 % IV SOLN
Freq: Once | INTRAVENOUS | Status: AC
  Administered 2019-01-12: 14:00:00 via INTRAVENOUS
  Filled 2019-01-12: qty 250

## 2019-01-12 MED ORDER — DEXAMETHASONE SODIUM PHOSPHATE 10 MG/ML IJ SOLN
10.0000 mg | Freq: Once | INTRAMUSCULAR | Status: AC
  Administered 2019-01-12: 10 mg via INTRAVENOUS
  Filled 2019-01-12: qty 1

## 2019-01-14 ENCOUNTER — Telehealth: Payer: Self-pay | Admitting: Surgery

## 2019-01-14 NOTE — Telephone Encounter (Signed)
Patient is calling and is a little concerned about his blood vessel is raised up to his port. Please call patient and advise.

## 2019-01-14 NOTE — Telephone Encounter (Signed)
Patient states his port was accessed Monday and Tuesday this week. Stating there is a blood vessel  above the port and he has not noticed this before. Showed picture to Dr.Cannon- looks normal. Patient notified.

## 2019-01-15 ENCOUNTER — Other Ambulatory Visit: Payer: Self-pay

## 2019-01-15 ENCOUNTER — Telehealth: Payer: Self-pay

## 2019-01-15 DIAGNOSIS — E1121 Type 2 diabetes mellitus with diabetic nephropathy: Secondary | ICD-10-CM

## 2019-01-15 DIAGNOSIS — IMO0002 Reserved for concepts with insufficient information to code with codable children: Secondary | ICD-10-CM

## 2019-01-15 MED ORDER — ONETOUCH ULTRA VI STRP: ORAL_STRIP | 0 refills | Status: DC

## 2019-01-15 NOTE — Telephone Encounter (Signed)
Received faxed rx request from [facility] asking for new rx with ICD 10 code on it.  Sent new rx.

## 2019-01-17 ENCOUNTER — Other Ambulatory Visit: Payer: Self-pay

## 2019-01-17 DIAGNOSIS — D649 Anemia, unspecified: Secondary | ICD-10-CM

## 2019-01-18 ENCOUNTER — Other Ambulatory Visit: Payer: Self-pay

## 2019-01-18 ENCOUNTER — Inpatient Hospital Stay: Payer: Medicare Other

## 2019-01-18 DIAGNOSIS — R5383 Other fatigue: Secondary | ICD-10-CM | POA: Diagnosis not present

## 2019-01-18 DIAGNOSIS — D649 Anemia, unspecified: Secondary | ICD-10-CM

## 2019-01-18 DIAGNOSIS — D693 Immune thrombocytopenic purpura: Secondary | ICD-10-CM | POA: Diagnosis not present

## 2019-01-18 DIAGNOSIS — E785 Hyperlipidemia, unspecified: Secondary | ICD-10-CM | POA: Diagnosis not present

## 2019-01-18 DIAGNOSIS — R531 Weakness: Secondary | ICD-10-CM | POA: Diagnosis not present

## 2019-01-18 DIAGNOSIS — C9110 Chronic lymphocytic leukemia of B-cell type not having achieved remission: Secondary | ICD-10-CM | POA: Diagnosis not present

## 2019-01-18 LAB — CBC WITH DIFFERENTIAL/PLATELET
Abs Immature Granulocytes: 0 10*3/uL (ref 0.00–0.07)
Band Neutrophils: 8 %
Basophils Absolute: 0 10*3/uL (ref 0.0–0.1)
Basophils Relative: 0 %
Eosinophils Absolute: 0.1 10*3/uL (ref 0.0–0.5)
Eosinophils Relative: 4 %
HCT: 27.4 % — ABNORMAL LOW (ref 39.0–52.0)
Hemoglobin: 8.7 g/dL — ABNORMAL LOW (ref 13.0–17.0)
Lymphocytes Relative: 5 %
Lymphs Abs: 0.1 10*3/uL — ABNORMAL LOW (ref 0.7–4.0)
MCH: 32.5 pg (ref 26.0–34.0)
MCHC: 31.8 g/dL (ref 30.0–36.0)
MCV: 102.2 fL — ABNORMAL HIGH (ref 80.0–100.0)
Metamyelocytes Relative: 1 %
Monocytes Absolute: 0.5 10*3/uL (ref 0.1–1.0)
Monocytes Relative: 19 %
Neutro Abs: 2 10*3/uL (ref 1.7–7.7)
Neutrophils Relative %: 63 %
Platelets: 61 10*3/uL — ABNORMAL LOW (ref 150–400)
RBC: 2.68 MIL/uL — ABNORMAL LOW (ref 4.22–5.81)
RDW: 15.2 % (ref 11.5–15.5)
Smear Review: DECREASED
WBC Morphology: ABNORMAL
WBC: 2.8 10*3/uL — ABNORMAL LOW (ref 4.0–10.5)
nRBC: 0 % (ref 0.0–0.2)

## 2019-01-18 LAB — COMPREHENSIVE METABOLIC PANEL
ALT: 16 U/L (ref 0–44)
AST: 30 U/L (ref 15–41)
Albumin: 3.6 g/dL (ref 3.5–5.0)
Alkaline Phosphatase: 96 U/L (ref 38–126)
Anion gap: 6 (ref 5–15)
BUN: 40 mg/dL — ABNORMAL HIGH (ref 8–23)
CO2: 29 mmol/L (ref 22–32)
Calcium: 8.9 mg/dL (ref 8.9–10.3)
Chloride: 105 mmol/L (ref 98–111)
Creatinine, Ser: 1.37 mg/dL — ABNORMAL HIGH (ref 0.61–1.24)
GFR calc Af Amer: 57 mL/min — ABNORMAL LOW (ref >60)
GFR calc non Af Amer: 49 mL/min — ABNORMAL LOW (ref >60)
Glucose, Bld: 87 mg/dL (ref 70–99)
Potassium: 4.1 mmol/L (ref 3.5–5.1)
Sodium: 140 mmol/L (ref 135–145)
Total Bilirubin: 0.7 mg/dL (ref 0.3–1.2)
Total Protein: 5.7 g/dL — ABNORMAL LOW (ref 6.5–8.1)

## 2019-01-18 LAB — SAMPLE TO BLOOD BANK

## 2019-01-18 MED ORDER — SODIUM CHLORIDE 0.9% FLUSH
10.0000 mL | Freq: Once | INTRAVENOUS | Status: AC
  Administered 2019-01-18: 10 mL via INTRAVENOUS
  Filled 2019-01-18: qty 10

## 2019-01-18 MED ORDER — HEPARIN SOD (PORK) LOCK FLUSH 100 UNIT/ML IV SOLN
500.0000 [IU] | Freq: Once | INTRAVENOUS | Status: AC
  Administered 2019-01-18: 500 [IU] via INTRAVENOUS
  Filled 2019-01-18: qty 5

## 2019-01-23 ENCOUNTER — Other Ambulatory Visit: Payer: Self-pay | Admitting: Family Medicine

## 2019-01-25 ENCOUNTER — Inpatient Hospital Stay: Payer: Medicare Other

## 2019-01-25 ENCOUNTER — Other Ambulatory Visit: Payer: Self-pay

## 2019-01-25 DIAGNOSIS — C9110 Chronic lymphocytic leukemia of B-cell type not having achieved remission: Secondary | ICD-10-CM | POA: Diagnosis not present

## 2019-01-25 DIAGNOSIS — R5383 Other fatigue: Secondary | ICD-10-CM | POA: Diagnosis not present

## 2019-01-25 DIAGNOSIS — D649 Anemia, unspecified: Secondary | ICD-10-CM | POA: Diagnosis not present

## 2019-01-25 DIAGNOSIS — D693 Immune thrombocytopenic purpura: Secondary | ICD-10-CM | POA: Diagnosis not present

## 2019-01-25 DIAGNOSIS — E785 Hyperlipidemia, unspecified: Secondary | ICD-10-CM | POA: Diagnosis not present

## 2019-01-25 DIAGNOSIS — R531 Weakness: Secondary | ICD-10-CM | POA: Diagnosis not present

## 2019-01-25 LAB — CBC WITH DIFFERENTIAL/PLATELET
Abs Immature Granulocytes: 0.11 10*3/uL — ABNORMAL HIGH (ref 0.00–0.07)
Basophils Absolute: 0 10*3/uL (ref 0.0–0.1)
Basophils Relative: 0 %
Eosinophils Absolute: 0 10*3/uL (ref 0.0–0.5)
Eosinophils Relative: 1 %
HCT: 29.3 % — ABNORMAL LOW (ref 39.0–52.0)
Hemoglobin: 9.1 g/dL — ABNORMAL LOW (ref 13.0–17.0)
Immature Granulocytes: 4 %
Lymphocytes Relative: 10 %
Lymphs Abs: 0.3 10*3/uL — ABNORMAL LOW (ref 0.7–4.0)
MCH: 32.6 pg (ref 26.0–34.0)
MCHC: 31.1 g/dL (ref 30.0–36.0)
MCV: 105 fL — ABNORMAL HIGH (ref 80.0–100.0)
Monocytes Absolute: 0.2 10*3/uL (ref 0.1–1.0)
Monocytes Relative: 7 %
Neutro Abs: 2.2 10*3/uL (ref 1.7–7.7)
Neutrophils Relative %: 78 %
Platelets: 57 10*3/uL — ABNORMAL LOW (ref 150–400)
RBC: 2.79 MIL/uL — ABNORMAL LOW (ref 4.22–5.81)
RDW: 15 % (ref 11.5–15.5)
WBC: 2.8 10*3/uL — ABNORMAL LOW (ref 4.0–10.5)
nRBC: 0 % (ref 0.0–0.2)

## 2019-01-25 LAB — SAMPLE TO BLOOD BANK

## 2019-01-25 MED ORDER — SODIUM CHLORIDE 0.9% FLUSH
10.0000 mL | Freq: Once | INTRAVENOUS | Status: AC
  Administered 2019-01-25: 10 mL via INTRAVENOUS
  Filled 2019-01-25: qty 10

## 2019-01-25 MED ORDER — HEPARIN SOD (PORK) LOCK FLUSH 100 UNIT/ML IV SOLN
500.0000 [IU] | Freq: Once | INTRAVENOUS | Status: AC
  Administered 2019-01-25: 500 [IU] via INTRAVENOUS
  Filled 2019-01-25: qty 5

## 2019-01-25 NOTE — Progress Notes (Signed)
Scott [name] Hgb is 9.1 and platelets are 57 today. Per Dr. [name] patient does not need a blood transfusion today. Port deaccessed and patient was sent home.

## 2019-01-27 DIAGNOSIS — H401132 Primary open-angle glaucoma, bilateral, moderate stage: Secondary | ICD-10-CM | POA: Diagnosis not present

## 2019-02-08 ENCOUNTER — Inpatient Hospital Stay: Payer: Medicare Other | Attending: Oncology

## 2019-02-08 ENCOUNTER — Inpatient Hospital Stay: Payer: Medicare Other

## 2019-02-08 ENCOUNTER — Other Ambulatory Visit: Payer: Self-pay

## 2019-02-08 DIAGNOSIS — D649 Anemia, unspecified: Secondary | ICD-10-CM | POA: Insufficient documentation

## 2019-02-08 DIAGNOSIS — C9110 Chronic lymphocytic leukemia of B-cell type not having achieved remission: Secondary | ICD-10-CM

## 2019-02-08 DIAGNOSIS — Z87891 Personal history of nicotine dependence: Secondary | ICD-10-CM | POA: Diagnosis not present

## 2019-02-08 DIAGNOSIS — Z8249 Family history of ischemic heart disease and other diseases of the circulatory system: Secondary | ICD-10-CM | POA: Insufficient documentation

## 2019-02-08 DIAGNOSIS — Z794 Long term (current) use of insulin: Secondary | ICD-10-CM | POA: Diagnosis not present

## 2019-02-08 DIAGNOSIS — I129 Hypertensive chronic kidney disease with stage 1 through stage 4 chronic kidney disease, or unspecified chronic kidney disease: Secondary | ICD-10-CM | POA: Diagnosis not present

## 2019-02-08 DIAGNOSIS — E039 Hypothyroidism, unspecified: Secondary | ICD-10-CM | POA: Diagnosis not present

## 2019-02-08 DIAGNOSIS — E1122 Type 2 diabetes mellitus with diabetic chronic kidney disease: Secondary | ICD-10-CM | POA: Insufficient documentation

## 2019-02-08 DIAGNOSIS — R531 Weakness: Secondary | ICD-10-CM | POA: Insufficient documentation

## 2019-02-08 DIAGNOSIS — Z833 Family history of diabetes mellitus: Secondary | ICD-10-CM | POA: Diagnosis not present

## 2019-02-08 DIAGNOSIS — R5383 Other fatigue: Secondary | ICD-10-CM | POA: Diagnosis not present

## 2019-02-08 DIAGNOSIS — D693 Immune thrombocytopenic purpura: Secondary | ICD-10-CM | POA: Insufficient documentation

## 2019-02-08 DIAGNOSIS — E785 Hyperlipidemia, unspecified: Secondary | ICD-10-CM | POA: Diagnosis not present

## 2019-02-08 DIAGNOSIS — Z79899 Other long term (current) drug therapy: Secondary | ICD-10-CM | POA: Insufficient documentation

## 2019-02-08 DIAGNOSIS — N189 Chronic kidney disease, unspecified: Secondary | ICD-10-CM | POA: Insufficient documentation

## 2019-02-08 LAB — CBC WITH DIFFERENTIAL/PLATELET
Abs Immature Granulocytes: 0.02 10*3/uL (ref 0.00–0.07)
Basophils Absolute: 0 10*3/uL (ref 0.0–0.1)
Basophils Relative: 0 %
Eosinophils Absolute: 0.1 10*3/uL (ref 0.0–0.5)
Eosinophils Relative: 3 %
HCT: 28.1 % — ABNORMAL LOW (ref 39.0–52.0)
Hemoglobin: 8.8 g/dL — ABNORMAL LOW (ref 13.0–17.0)
Immature Granulocytes: 1 %
Lymphocytes Relative: 11 %
Lymphs Abs: 0.3 10*3/uL — ABNORMAL LOW (ref 0.7–4.0)
MCH: 32.4 pg (ref 26.0–34.0)
MCHC: 31.3 g/dL (ref 30.0–36.0)
MCV: 103.3 fL — ABNORMAL HIGH (ref 80.0–100.0)
Monocytes Absolute: 0.3 10*3/uL (ref 0.1–1.0)
Monocytes Relative: 11 %
Neutro Abs: 1.8 10*3/uL (ref 1.7–7.7)
Neutrophils Relative %: 74 %
Platelets: 63 10*3/uL — ABNORMAL LOW (ref 150–400)
RBC: 2.72 MIL/uL — ABNORMAL LOW (ref 4.22–5.81)
RDW: 14.5 % (ref 11.5–15.5)
WBC: 2.4 10*3/uL — ABNORMAL LOW (ref 4.0–10.5)
nRBC: 0 % (ref 0.0–0.2)

## 2019-02-08 LAB — COMPREHENSIVE METABOLIC PANEL
ALT: 18 U/L (ref 0–44)
AST: 27 U/L (ref 15–41)
Albumin: 3.8 g/dL (ref 3.5–5.0)
Alkaline Phosphatase: 86 U/L (ref 38–126)
Anion gap: 6 (ref 5–15)
BUN: 39 mg/dL — ABNORMAL HIGH (ref 8–23)
CO2: 29 mmol/L (ref 22–32)
Calcium: 9.4 mg/dL (ref 8.9–10.3)
Chloride: 109 mmol/L (ref 98–111)
Creatinine, Ser: 1.38 mg/dL — ABNORMAL HIGH (ref 0.61–1.24)
GFR calc Af Amer: 56 mL/min — ABNORMAL LOW (ref >60)
GFR calc non Af Amer: 49 mL/min — ABNORMAL LOW (ref >60)
Glucose, Bld: 137 mg/dL — ABNORMAL HIGH (ref 70–99)
Potassium: 5.3 mmol/L — ABNORMAL HIGH (ref 3.5–5.1)
Sodium: 144 mmol/L (ref 135–145)
Total Bilirubin: 0.8 mg/dL (ref 0.3–1.2)
Total Protein: 6.1 g/dL — ABNORMAL LOW (ref 6.5–8.1)

## 2019-02-18 NOTE — Progress Notes (Signed)
[facility]  [phone] [phone]  ID: [name] OB: Apr 08, 1940  MR#: [id number]  DJM#:[id number]  Patient Care Team: [name], MD as PCP - General (Family Medicine) Dorothy Spark, MD as PCP - Cardiology (Cardiology) Lloyd Huger, MD as Consulting Physician (Oncology) Clent Jacks, MD as Consulting Physician (Ophthalmology) Dorothy Spark, MD as Consulting Physician (Cardiology) Madelon Lips, MD as Consulting Physician (Nephrology)  CHIEF COMPLAINT: 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  INTERVAL HISTORY: Patient returns to clinic today for repeat laboratory work and further evaluation.  He continues to have mild weakness and fatigue, but this has significantly improved.  He otherwise feels well and is asymptomatic.  He does not complain of any easy bleeding or bruising. He denies any recent fevers or illnesses.  He denies any dizziness or other neurologic complaints. He has a good appetite and denies weight loss. He denies any night sweats. He has noted no lymphadenopathy.  He has no chest pain, shortness of breath, cough, or hemoptysis.  He denies any nausea, vomiting, constipation, or diarrhea. He has no urinary complaints.  Patient offers no further specific complaints today.  REVIEW OF SYSTEMS:   Review of Systems  Constitutional: Positive for malaise/fatigue. Negative for fever and weight loss.  HENT: Negative.  Negative for nosebleeds.   Respiratory: Negative.  Negative for cough and shortness of breath.   Cardiovascular: Negative.  Negative for chest pain and leg swelling.  Gastrointestinal: Negative.  Negative for abdominal pain, blood in stool and melena.  Genitourinary: Negative.  Negative for dysuria and hematuria.  Musculoskeletal: Negative.  Negative for back pain and neck pain.  Skin: Negative.  Negative for itching and rash.  Neurological: Negative.  Negative for dizziness, sensory change,  focal weakness, weakness and headaches.  Endo/Heme/Allergies: Negative.  Does not bruise/bleed easily.  Psychiatric/Behavioral: Negative.  The patient is not nervous/anxious.     As per HPI. Otherwise, a complete review of systems is negative.  PAST MEDICAL HISTORY: Past Medical History:  Diagnosis Date  . Acquired hand deformity 1962   hand saw accident at work  . Anemia 10/11/2011  . Arthritis   . Bradycardia 10/10/2011  . CAD (coronary artery disease) 10/10/2011   MI s/p PTCA (Dx-OM2 proximal concentric stenosis)  . Carotid artery disease ([place])   . Chronic edema   . CKD (chronic kidney disease) stage 3, GFR 30-59 ml/min    [name]  . CLL (chronic lymphocytic leukemia) ([place])   . Colon polyps   . Generalized headaches    frequent  . GI bleed 07/08/2017  . Glaucoma    s/p laser surgery  . HLD (hyperlipidemia)   . Hypertension   . Hypothyroidism 10/10/2011  . Thrombocytopenia ([place]) 10/11/2011  . Type 2 diabetes with nephropathy [facility])    DM refresher course [facility] (04/2013)    PAST SURGICAL HISTORY: Past Surgical History:  Procedure Laterality Date  . BACK SURGERY     cervical neck  . CATARACT EXTRACTION, BILATERAL Bilateral 2017  . COLONOSCOPY  11/2008   1 polyp, diverticulosis, rec rpt 5 yrs (Dr. Oletta Lamas, [name])  . COLONOSCOPY  06/2014   hyperplastic polyp, rpt 5 yrs ([place])  . COLONOSCOPY WITH PROPOFOL N/A 11/17/2017   TA, HP, (Vanga, Tally Due, MD)  . ESOPHAGOGASTRODUODENOSCOPY (EGD) WITH PROPOFOL N/A 11/17/2017   healing erosive gastritis, intestinal metaplasia, neg H pylori (Vanga, Tally Due, MD)  . EYE SURGERY  2012   laser surgery for glaucoma  . PORTACATH  PLACEMENT Left 12/09/2018   Procedure: INSERTION PORT-A-CATH;  Surgeon: [name], MD;  Location: [facility] ORS;  Service: General;  Laterality: Left;  . PTCA  1994, 1995  . US ECHOCARDIOGRAPHY  10/2013   inferior wall hypokinesis, mild LVH, EF 50-55%, mild MR and LA dilation    FAMILY HISTORY: Family History    Problem Relation Age of Onset  . Diabetes Sister   . Stomach cancer Sister 64  . Pneumonia Father        caused death  . Other Brother        no communication with brother so unsure of any health conditions  . Coronary artery disease Son 78       5v CABG and stents  . Hyperlipidemia Sister   . Stroke Neg Hx   . Heart attack Neg Hx     ADVANCED DIRECTIVES (Y/N):  N  HEALTH MAINTENANCE: Social History   Tobacco Use  . Smoking status: Former Smoker    Quit date: 03/04/1978    Years since quitting: 41.0  . Smokeless tobacco: Never Used  Substance Use Topics  . Alcohol use: No  . Drug use: No     Colonoscopy:  PAP:  Bone density:  Lipid panel:  No Known Allergies  Current Outpatient Medications  Medication Sig Dispense Refill  . acetaminophen (TYLENOL) 650 MG CR tablet Take 1,300 mg by mouth 2 (two) times daily.     Marland Kitchen amLODipine (NORVASC) 5 MG tablet Take 1 tablet (5 mg total) by mouth daily. 90 tablet 3  . atorvastatin (LIPITOR) 80 MG tablet Take 1 tablet by mouth once daily 90 tablet 0  . carvedilol (COREG) 6.25 MG tablet Take 1 tablet (6.25 mg total) by mouth 2 (two) times daily with a meal. 180 tablet 3  . clotrimazole (LOTRIMIN) 1 % cream Apply 1 application topically 2 (two) times daily. 80 g 0  . docusate sodium (COLACE) 100 MG capsule Take 100 mg by mouth daily as needed.     Marland Kitchen glucose blood (ONETOUCH ULTRA) test strip USE 1 STRIP TO CHECK GLUCOSE 4 TIMES DAILY AND  AS  NEEDED  TO  CHECK  SUGARS 300 each 0  . insulin aspart (NOVOLOG FLEXPEN) 100 UNIT/ML FlexPen Take 5 units with meals, take 10 units if mealtime sugar >250 (Patient taking differently: Inject 5-10 Units into the skin See admin instructions. Take 5 units with meals, take 10 units if mealtime sugar >250) 15 mL 11  . LEVEMIR FLEXTOUCH 100 UNIT/ML Pen INJECT 25 UNITS SUBCUTANEOUSLY ONCE DAILY AT  10PM 15 mL 0  . levothyroxine (SYNTHROID) 88 MCG tablet Take 1 tablet by mouth once daily 90 tablet 0  .  nitroGLYCERIN (NITROSTAT) 0.4 MG SL tablet Place 1 tablet (0.4 mg total) under the tongue every 5 (five) minutes as needed for chest pain ((MAX of 3 doses)). 25 tablet 0  . oxymetazoline (AFRIN) 0.05 % nasal spray Place 1 spray into left nostril daily as needed (nose bleeds).    . traZODone (DESYREL) 100 MG tablet TAKE 1 TABLET BY MOUTH AT BEDTIME AS NEEDED FOR SLEEP 90 tablet 0  . lidocaine-prilocaine (EMLA) cream Apply 1 application topically as needed. To port site prior to access (Patient not taking: Reported on 02/23/2019) 30 g 2  . ondansetron (ZOFRAN) 8 MG tablet Take 1 tablet (8 mg total) by mouth 2 (two) times daily as needed for refractory nausea / vomiting. (Patient not taking: Reported on 02/23/2019) 30 tablet 2  . oxyCODONE (OXY  IR/ROXICODONE) 5 MG immediate release tablet Take 1 tablet (5 mg total) by mouth every 4 (four) hours as needed for severe pain. (Patient not taking: Reported on 02/23/2019) 20 tablet 0  . prochlorperazine (COMPAZINE) 10 MG tablet Take 1 tablet (10 mg total) by mouth every 6 (six) hours as needed (Nausea or vomiting). (Patient not taking: Reported on 02/23/2019) 60 tablet 2   No current facility-administered medications for this visit.   Facility-Administered Medications Ordered in Other Visits  Medication Dose Route Frequency Provider Last Rate Last Admin  . sodium chloride flush (NS) 0.9 % injection 10 mL  10 mL Intravenous Once Lloyd Huger, MD        OBJECTIVE: Vitals:   02/23/19 0833  BP: 128/69  Pulse: 69  Temp: (!) 94.6 F (34.8 C)     Body mass index is 21.56 kg/m.    ECOG FS:0 - Asymptomatic  General: Well-developed, well-nourished, no acute distress. Eyes: Pink conjunctiva, anicteric sclera. HEENT: Normocephalic, moist mucous membranes. Lungs: No audible wheezing or coughing. Heart: Regular rate and rhythm. Abdomen: Soft, nontender, no obvious distention. Musculoskeletal: No edema, cyanosis, or clubbing. Neuro: Alert, answering  all questions appropriately. Cranial nerves grossly intact. Skin: No rashes or petechiae noted. Psych: Normal affect.   LAB RESULTS:  Lab Results  Component Value Date   NA 141 02/23/2019   K 4.2 02/23/2019   CL 106 02/23/2019   CO2 29 02/23/2019   GLUCOSE 223 (H) 02/23/2019   BUN 50 (H) 02/23/2019   CREATININE 1.45 (H) 02/23/2019   CALCIUM 9.7 02/23/2019   PROT 6.3 (L) 02/23/2019   ALBUMIN 4.0 02/23/2019   AST 22 02/23/2019   ALT 18 02/23/2019   ALKPHOS 84 02/23/2019   BILITOT 0.5 02/23/2019   GFRNONAA 46 (L) 02/23/2019   GFRAA 53 (L) 02/23/2019    Lab Results  Component Value Date   WBC 2.5 (L) 02/23/2019   NEUTROABS 1.8 02/23/2019   HGB 8.6 (L) 02/23/2019   HCT 27.7 (L) 02/23/2019   MCV 103.4 (H) 02/23/2019   PLT 100 (L) 02/23/2019     STUDIES: No results found.  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  PLAN:    1. CLL: Repeat bone marrow biopsy on Jul 28, 2018 reviewed independently with 73% involvement of CLL.  Previous bone marrow biopsy on October 23, 2017 revealed only 31% involvement.  Previously, peripheral blood FISH testing 50% incidence of mutation in the ATM gene which is commonly associated with deletion 11q. 11q- is associated with an unfavorable prognosis and high risk of not responding to initial treatment. 13q- is a more common mutation and is actually associated with a more favorable prognosis. Patient has clear progression of disease in his bone marrow along with a worsening transfusion requirement.  He received 5 weekly cycles of Rituxan with mild improvement of his platelet count.  Patient then received 4 cycles of Rituxan plus Treanda completing treatment on January 12, 2019.  No further interventions are needed.  Return to clinic in 4 weeks for laboratory work only and then in 8 weeks for laboratory work and further evaluation.   2.  Thrombocytopenia: Significantly improved with platelet count 100 today.  Previously, he  received high-dose prednisone, IVIG, and Rituxan with no appreciable durability to improve his platelet count.   3.  Anemia: Chronic and unchanged with hemoglobin at 8.6.  Patient's last blood transfusion was given on December 03, 2018.  Patient had colonoscopy on November 17, 2017 that  removed 2 nonmalignant polyps.  EGD on the same day revealed nonbleeding erosive gastropathy with multiple nonbleeding duodenal ulcers.  Consider referral back to GI after chemotherapy is complete.  4.  Reaction to Rituxan: Rate-based.  Patient will require premedications for any further infusions with Rituxan.  These have been entered into his treatment plan. 5.  Easy bruising: Resolved. 6.  Nosebleeds: Resolved.  Patient was previously given a referral back to ENT for further evaluation. 7.  Rash: Resolved. 8.  Leukopenia: Chronic and unchanged.  If patient requires retreatment, he will likely Neulasta as well.    Patient expressed understanding and was in agreement with this plan. He also understands that He can call clinic at any time with any questions, concerns, or complaints.    Lloyd Huger, MD   02/23/2019 9:11 AM

## 2019-02-22 ENCOUNTER — Ambulatory Visit (INDEPENDENT_AMBULATORY_CARE_PROVIDER_SITE_OTHER): Payer: Medicare Other

## 2019-02-22 ENCOUNTER — Ambulatory Visit: Payer: Medicare Other

## 2019-02-22 ENCOUNTER — Other Ambulatory Visit: Payer: Self-pay | Admitting: Family Medicine

## 2019-02-22 ENCOUNTER — Other Ambulatory Visit (INDEPENDENT_AMBULATORY_CARE_PROVIDER_SITE_OTHER): Payer: Medicare Other

## 2019-02-22 VITALS — BP 143/65 | Wt 161.0 lb

## 2019-02-22 DIAGNOSIS — C9110 Chronic lymphocytic leukemia of B-cell type not having achieved remission: Secondary | ICD-10-CM

## 2019-02-22 DIAGNOSIS — IMO0002 Reserved for concepts with insufficient information to code with codable children: Secondary | ICD-10-CM

## 2019-02-22 DIAGNOSIS — N183 Chronic kidney disease, stage 3 unspecified: Secondary | ICD-10-CM

## 2019-02-22 DIAGNOSIS — E785 Hyperlipidemia, unspecified: Secondary | ICD-10-CM

## 2019-02-22 DIAGNOSIS — E039 Hypothyroidism, unspecified: Secondary | ICD-10-CM

## 2019-02-22 DIAGNOSIS — E1165 Type 2 diabetes mellitus with hyperglycemia: Secondary | ICD-10-CM

## 2019-02-22 DIAGNOSIS — E1169 Type 2 diabetes mellitus with other specified complication: Secondary | ICD-10-CM | POA: Diagnosis not present

## 2019-02-22 DIAGNOSIS — E1121 Type 2 diabetes mellitus with diabetic nephropathy: Secondary | ICD-10-CM | POA: Diagnosis not present

## 2019-02-22 DIAGNOSIS — D500 Iron deficiency anemia secondary to blood loss (chronic): Secondary | ICD-10-CM

## 2019-02-22 DIAGNOSIS — Z0000 Encounter for general adult medical examination without abnormal findings: Secondary | ICD-10-CM

## 2019-02-22 LAB — LIPID PANEL
Cholesterol: 148 mg/dL (ref 0–200)
HDL: 41.6 mg/dL (ref >39.00)
LDL Cholesterol: 83 mg/dL (ref 0–99)
NonHDL: 106.51
Total CHOL/HDL Ratio: 4
Triglycerides: 120 mg/dL (ref 0.0–149.0)
VLDL: 24 mg/dL (ref 0.0–40.0)

## 2019-02-22 LAB — MICROALBUMIN / CREATININE URINE RATIO
Creatinine,U: 134 mg/dL
Microalb Creat Ratio: 21.6 mg/g (ref 0.0–30.0)
Microalb, Ur: 29 mg/dL — ABNORMAL HIGH (ref 0.0–1.9)

## 2019-02-22 LAB — T4, FREE: Free T4: 0.87 ng/dL (ref 0.60–1.60)

## 2019-02-22 LAB — TSH: TSH: 2.39 u[IU]/mL (ref 0.35–4.50)

## 2019-02-22 LAB — HEMOGLOBIN A1C: Hgb A1c MFr Bld: 7.9 % — ABNORMAL HIGH (ref 4.6–6.5)

## 2019-02-22 NOTE — Patient Instructions (Signed)
Scott [name] , Thank you for taking time to come for your Medicare Wellness Visit. I appreciate your ongoing commitment to your health goals. Please review the following plan we discussed and let me know if I can assist you in the future.   Screening recommendations/referrals: Colonoscopy: Up to date, completed 11/17/2017 Recommended yearly ophthalmology/optometry visit for glaucoma screening and checkup Recommended yearly dental visit for hygiene and checkup  Vaccinations: Influenza vaccine: Up to date, completed 11/04/2018 Pneumococcal vaccine: Completed series Tdap vaccine: Up to date, completed 08/23/2017 Shingles vaccine: discussed    Advanced directives: Please bring a copy of your POA (Power of [place]) and/or Living Will to your next appointment.   Conditions/risks identified: diabetes, hypertension, hyperlipidemia  Next appointment: 03/01/2019 @ 9:30 am   Preventive Care 78 Years and Older, Male Preventive care refers to lifestyle choices and visits with your health care provider that can promote health and wellness. What does preventive care include?  A yearly physical exam. This is also called an annual well check.  Dental exams once or twice a year.  Routine eye exams. Ask your health care provider how often you should have your eyes checked.  Personal lifestyle choices, including:  Daily care of your teeth and gums.  Regular physical activity.  Eating a healthy diet.  Avoiding tobacco and drug use.  Limiting alcohol use.  Practicing safe sex.  Taking low doses of aspirin every day.  Taking vitamin and mineral supplements as recommended by your health care provider. What happens during an annual well check? The services and screenings done by your health care provider during your annual well check will depend on your age, overall health, lifestyle risk factors, and family history of disease. Counseling  Your health care provider may ask you questions about  your:  Alcohol use.  Tobacco use.  Drug use.  Emotional well-being.  Home and relationship well-being.  Sexual activity.  Eating habits.  History of falls.  Memory and ability to understand (cognition).  Work and work Statistician. Screening  You may have the following tests or measurements:  Height, weight, and BMI.  Blood pressure.  Lipid and cholesterol levels. These may be checked every 5 years, or more frequently if you are over 42 years old.  Skin check.  Lung cancer screening. You may have this screening every year starting at age 63 if you have a 30-pack-year history of smoking and currently smoke or have quit within the past 15 years.  Fecal occult blood test (FOBT) of the stool. You may have this test every year starting at age 81.  Flexible sigmoidoscopy or colonoscopy. You may have a sigmoidoscopy every 5 years or a colonoscopy every 10 years starting at age 65.  Prostate cancer screening. Recommendations will vary depending on your family history and other risks.  Hepatitis C blood test.  Hepatitis B blood test.  Sexually transmitted disease (STD) testing.  Diabetes screening. This is done by checking your blood sugar (glucose) after you have not eaten for a while (fasting). You may have this done every 1-3 years.  Abdominal aortic aneurysm (AAA) screening. You may need this if you are a current or former smoker.  Osteoporosis. You may be screened starting at age 43 if you are at high risk. Talk with your health care provider about your test results, treatment options, and if necessary, the need for more tests. Vaccines  Your health care provider may recommend certain vaccines, such as:  Influenza vaccine. This is recommended every year.  Tetanus, diphtheria, and acellular pertussis (Tdap, Td) vaccine. You may need a Td booster every 10 years.  Zoster vaccine. You may need this after age 3.  Pneumococcal 13-valent conjugate (PCV13) vaccine.  One dose is recommended after age 82.  Pneumococcal polysaccharide (PPSV23) vaccine. One dose is recommended after age 60. Talk to your health care provider about which screenings and vaccines you need and how often you need them. This information is not intended to replace advice given to you by your health care provider. Make sure you discuss any questions you have with your health care provider. Document Released: 03/17/2015 Document Revised: 11/08/2015 Document Reviewed: 12/20/2014 Elsevier Interactive Patient Education  2017 [place] Prevention in the Home Falls can cause injuries. They can happen to people of all ages. There are many things you can do to make your home safe and to help prevent falls. What can I do on the outside of my home?  Regularly fix the edges of walkways and driveways and fix any cracks.  Remove anything that might make you trip as you walk through a door, such as a raised step or threshold.  Trim any bushes or trees on the path to your home.  Use bright outdoor lighting.  Clear any walking paths of anything that might make someone trip, such as rocks or tools.  Regularly check to see if handrails are loose or broken. Make sure that both sides of any steps have handrails.  Any raised decks and porches should have guardrails on the edges.  Have any leaves, snow, or ice cleared regularly.  Use sand or salt on walking paths during winter.  Clean up any spills in your garage right away. This includes oil or grease spills. What can I do in the bathroom?  Use night lights.  Install grab bars by the toilet and in the tub and shower. Do not use towel bars as grab bars.  Use non-skid mats or decals in the tub or shower.  If you need to sit down in the shower, use a plastic, non-slip stool.  Keep the floor dry. Clean up any water that spills on the floor as soon as it happens.  Remove soap buildup in the tub or shower regularly.  Attach bath  mats securely with double-sided non-slip rug tape.  Do not have throw rugs and other things on the floor that can make you trip. What can I do in the bedroom?  Use night lights.  Make sure that you have a light by your bed that is easy to reach.  Do not use any sheets or blankets that are too big for your bed. They should not hang down onto the floor.  Have a firm chair that has side arms. You can use this for support while you get dressed.  Do not have throw rugs and other things on the floor that can make you trip. What can I do in the kitchen?  Clean up any spills right away.  Avoid walking on wet floors.  Keep items that you use a lot in easy-to-reach places.  If you need to reach something above you, use a strong step stool that has a grab bar.  Keep electrical cords out of the way.  Do not use floor polish or wax that makes floors slippery. If you must use wax, use non-skid floor wax.  Do not have throw rugs and other things on the floor that can make you trip. What can I do  with my stairs?  Do not leave any items on the stairs.  Make sure that there are handrails on both sides of the stairs and use them. Fix handrails that are broken or loose. Make sure that handrails are as long as the stairways.  Check any carpeting to make sure that it is firmly attached to the stairs. Fix any carpet that is loose or worn.  Avoid having throw rugs at the top or bottom of the stairs. If you do have throw rugs, attach them to the floor with carpet tape.  Make sure that you have a light switch at the top of the stairs and the bottom of the stairs. If you do not have them, ask someone to add them for you. What else can I do to help prevent falls?  Wear shoes that:  Do not have high heels.  Have rubber bottoms.  Are comfortable and fit you well.  Are closed at the toe. Do not wear sandals.  If you use a stepladder:  Make sure that it is fully opened. Do not climb a closed  stepladder.  Make sure that both sides of the stepladder are locked into place.  Ask someone to hold it for you, if possible.  Clearly mark and make sure that you can see:  Any grab bars or handrails.  First and last steps.  Where the edge of each step is.  Use tools that help you move around (mobility aids) if they are needed. These include:  Canes.  Walkers.  Scooters.  Crutches.  Turn on the lights when you go into a dark area. Replace any light bulbs as soon as they burn out.  Set up your furniture so you have a clear path. Avoid moving your furniture around.  If any of your floors are uneven, fix them.  If there are any pets around you, be aware of where they are.  Review your medicines with your doctor. Some medicines can make you feel dizzy. This can increase your chance of falling. Ask your doctor what other things that you can do to help prevent falls. This information is not intended to replace advice given to you by your health care provider. Make sure you discuss any questions you have with your health care provider. Document Released: 12/15/2008 Document Revised: 07/27/2015 Document Reviewed: 03/25/2014 Elsevier Interactive Patient Education  2017 [employer].

## 2019-02-22 NOTE — Progress Notes (Signed)
Subjective:   Scott [name] is a 78 y.o. male who presents for Medicare Annual/Subsequent preventive examination.  Review of Systems: N/A   This visit is being conducted through telemedicine via telephone at the nurse health advisor's home address due to the COVID-19 pandemic. This patient has given me verbal consent via doximity to conduct this visit, patient states they are participating from their home address. Patient and myself are on the telephone call. There is no referral for this visit. Some vital signs may be absent or patient reported.    Patient identification: identified by name, DOB, and current address   Cardiac Risk Factors include: advanced age (>92men, >35 women);diabetes mellitus;hypertension;male gender;dyslipidemia     Objective:    Vitals: BP (!) 143/65   Wt 161 lb (73 kg)   BMI 22.45 kg/m   Body mass index is 22.45 kg/m.  Advanced Directives 02/22/2019 01/11/2019 12/14/2018 12/09/2018 12/07/2018 11/23/2018 10/26/2018  Does Patient Have a Medical Advance Directive? Yes Yes No No Yes No No  Type of Paramedic of [place];Living will Living will;Healthcare Power of Attorney - - - - -  Does patient want to make changes to medical advance directive? - - No - Patient declined No - Patient declined No - Patient declined No - Patient declined No - Patient declined  Copy of [place] in Chart? No - copy requested No - copy requested - - - - -  Would patient like information on creating a medical advance directive? - - - - - - No - Patient declined  Pre-existing out of facility DNR order (yellow form or pink MOST form) - - - - - - -    Tobacco Social History   Tobacco Use  Smoking Status Former Smoker  . Quit date: 03/04/1978  . Years since quitting: 41.0  Smokeless Tobacco Never Used     Counseling given: Not Answered   Clinical Intake:  Pre-visit preparation completed: Yes  Pain : No/denies pain     Nutritional  Risks: None Diabetes: Yes CBG done?: No Did pt. bring in CBG monitor from home?: No  How often do you need to have someone help you when you read instructions, pamphlets, or other written materials from your doctor or pharmacy?: 1 - Never What is the last grade level you completed in school?: 11th  Interpreter Needed?: No  Information entered by :: CJohnson, LPN  Past Medical History:  Diagnosis Date  . Acquired hand deformity 1962   hand saw accident at work  . Anemia 10/11/2011  . Arthritis   . Bradycardia 10/10/2011  . CAD (coronary artery disease) 10/10/2011   MI s/p PTCA (Dx-OM2 proximal concentric stenosis)  . Carotid artery disease ([place])   . Chronic edema   . CKD (chronic kidney disease) stage 3, GFR 30-59 ml/min    [name]  . CLL (chronic lymphocytic leukemia) ([place])   . Colon polyps   . Generalized headaches    frequent  . GI bleed 07/08/2017  . Glaucoma    s/p laser surgery  . HLD (hyperlipidemia)   . Hypertension   . Hypothyroidism 10/10/2011  . Thrombocytopenia ([place]) 10/11/2011  . Type 2 diabetes with nephropathy [facility])    DM refresher course [facility] (04/2013)   Past Surgical History:  Procedure Laterality Date  . BACK SURGERY     cervical neck  . CATARACT EXTRACTION, BILATERAL Bilateral 2017  . COLONOSCOPY  11/2008   1 polyp, diverticulosis, rec rpt 5 yrs (  Dr. Oletta Lamas, [name])  . COLONOSCOPY  06/2014   hyperplastic polyp, rpt 5 yrs ([place])  . COLONOSCOPY WITH PROPOFOL N/A 11/17/2017   TA, HP, (Vanga, Tally Due, MD)  . ESOPHAGOGASTRODUODENOSCOPY (EGD) WITH PROPOFOL N/A 11/17/2017   healing erosive gastritis, intestinal metaplasia, neg H pylori (Vanga, Tally Due, MD)  . EYE SURGERY  2012   laser surgery for glaucoma  . PORTACATH PLACEMENT Left 12/09/2018   Procedure: INSERTION PORT-A-CATH;  Surgeon: [name], MD;  Location: [facility] ORS;  Service: General;  Laterality: Left;  . PTCA  1994, 1995  . US ECHOCARDIOGRAPHY  10/2013   inferior wall hypokinesis, mild  LVH, EF 50-55%, mild MR and LA dilation   Family History  Problem Relation Age of Onset  . Diabetes Sister   . Stomach cancer Sister 21  . Pneumonia Father        caused death  . Other Brother        no communication with brother so unsure of any health conditions  . Coronary artery disease Son 69       5v CABG and stents  . Hyperlipidemia Sister   . Stroke Neg Hx   . Heart attack Neg Hx    Social History   Socioeconomic History  . Marital status: Widowed    Spouse name: Not on file  . Number of children: Not on file  . Years of education: Not on file  . Highest education level: Not on file  Occupational History  . Not on file  Tobacco Use  . Smoking status: Former Smoker    Quit date: 03/04/1978    Years since quitting: 41.0  . Smokeless tobacco: Never Used  Substance and Sexual Activity  . Alcohol use: No  . Drug use: No  . Sexual activity: Never  Other Topics Concern  . Not on file  Social History Narrative   Widower. Wife passed 08/2015 from cancer. 1 dog   Occupation: retired, was route Hotel manager   Edu: 11th gr   Activity: walks dog (pomeranian) 3-4x/day, yardwork, rides bicycle.   Diet: drinks diet coke, no vegetables   Social Determinants of Health   Financial Resource Strain:   . Difficulty of Paying Living Expenses: Not on file  Food Insecurity:   . Worried About Charity fundraiser in the Last Year: Not on file  . Ran Out of Food in the Last Year: Not on file  Transportation Needs:   . Lack of Transportation (Medical): Not on file  . Lack of Transportation (Non-Medical): Not on file  Physical Activity:   . Days of Exercise per Week: Not on file  . Minutes of Exercise per Session: Not on file  Stress:   . Feeling of Stress : Not on file  Social Connections:   . Frequency of Communication with Friends and Family: Not on file  . Frequency of Social Gatherings with Friends and Family: Not on file  . Attends Religious Services: Not on file  . Active  Member of Clubs or Organizations: Not on file  . Attends Archivist Meetings: Not on file  . Marital Status: Not on file    Outpatient Encounter Medications as of 02/22/2019  Medication Sig  . acetaminophen (TYLENOL) 650 MG CR tablet Take 1,300 mg by mouth 2 (two) times daily.   Marland Kitchen amLODipine (NORVASC) 5 MG tablet Take 1 tablet (5 mg total) by mouth daily.  Marland Kitchen atorvastatin (LIPITOR) 80 MG tablet Take 1 tablet by mouth once daily  .  carvedilol (COREG) 6.25 MG tablet Take 1 tablet (6.25 mg total) by mouth 2 (two) times daily with a meal.  . clotrimazole (LOTRIMIN) 1 % cream Apply 1 application topically 2 (two) times daily.  Marland Kitchen docusate sodium (COLACE) 100 MG capsule Take 100 mg by mouth daily.  Marland Kitchen glucose blood (ONETOUCH ULTRA) test strip USE 1 STRIP TO CHECK GLUCOSE 4 TIMES DAILY AND  AS  NEEDED  TO  CHECK  SUGARS  . insulin aspart (NOVOLOG FLEXPEN) 100 UNIT/ML FlexPen Take 5 units with meals, take 10 units if mealtime sugar >250 (Patient taking differently: Inject 5-10 Units into the skin See admin instructions. Take 5 units with meals, take 10 units if mealtime sugar >250)  . LEVEMIR FLEXTOUCH 100 UNIT/ML Pen INJECT 25 UNITS SUBCUTANEOUSLY ONCE DAILY AT  10PM  . levothyroxine (SYNTHROID) 88 MCG tablet Take 1 tablet by mouth once daily  . lidocaine-prilocaine (EMLA) cream Apply 1 application topically as needed. To port site prior to access  . nitroGLYCERIN (NITROSTAT) 0.4 MG SL tablet Place 1 tablet (0.4 mg total) under the tongue every 5 (five) minutes as needed for chest pain ((MAX of 3 doses)).  Marland Kitchen ondansetron (ZOFRAN) 8 MG tablet Take 1 tablet (8 mg total) by mouth 2 (two) times daily as needed for refractory nausea / vomiting.  Marland Kitchen oxyCODONE (OXY IR/ROXICODONE) 5 MG immediate release tablet Take 1 tablet (5 mg total) by mouth every 4 (four) hours as needed for severe pain.  Marland Kitchen oxymetazoline (AFRIN) 0.05 % nasal spray Place 1 spray into left nostril daily as needed (nose bleeds).  .  prochlorperazine (COMPAZINE) 10 MG tablet Take 1 tablet (10 mg total) by mouth every 6 (six) hours as needed (Nausea or vomiting).  . traZODone (DESYREL) 100 MG tablet TAKE 1 TABLET BY MOUTH AT BEDTIME AS NEEDED FOR SLEEP   No facility-administered encounter medications on file as of 02/22/2019.    Activities of Daily Living In your present state of health, do you have any difficulty performing the following activities: 02/22/2019 12/09/2018  Hearing? Y N  Comment hearing loss noted -  Vision? N Y  Difficulty concentrating or making decisions? Y N  Comment forgets frequently -  Walking or climbing stairs? N N  Dressing or bathing? N N  Doing errands, shopping? N -  Preparing Food and eating ? N -  Using the Toilet? N -  In the past six months, have you accidently leaked urine? N -  Do you have problems with loss of bowel control? N -  Managing your Medications? N -  Managing your Finances? N -  Housekeeping or managing your Housekeeping? N -  Some recent data might be hidden    Patient Care Team: [name], MD as PCP - General (Family Medicine) Dorothy Spark, MD as PCP - Cardiology (Cardiology) Lloyd Huger, MD as Consulting Physician (Oncology) Clent Jacks, MD as Consulting Physician (Ophthalmology) Dorothy Spark, MD as Consulting Physician (Cardiology) Madelon Lips, MD as Consulting Physician (Nephrology)   Assessment:   This is a routine wellness examination for [place].  Exercise Activities and Dietary recommendations Current Exercise Habits: The patient does not participate in regular exercise at present, Exercise limited by: None identified  Goals    . Patient Stated     Starting 02/20/2018, I will continue to take medications as prescribed.     . Patient Stated     02/22/2019, I will maintain and continue medications as prescribed.  Fall Risk Fall Risk  02/22/2019 12/25/2018 12/08/2018 10/26/2018 03/19/2018  Falls in the past  year? 1 0 0 0 0  Comment tripped out of shower - - - -  Number falls in past yr: 0 0 - - -  Comment - - - - -  Injury with Fall? 0 0 - - -  Risk for fall due to : Medication side effect - - - -  Follow up Falls evaluation completed;Falls prevention discussed - - - -   Is the patient's home free of loose throw rugs in walkways, pet beds, electrical cords, etc?   yes      Grab bars in the bathroom? yes      Handrails on the stairs?   yes      Adequate lighting?   yes  Timed Get Up and Go Performed: N/A  Depression Screen PHQ 2/9 Scores 02/22/2019 03/19/2018 02/20/2018 02/19/2017  PHQ - 2 Score 0 0 0 0  PHQ- 9 Score 0 - 0 0    Cognitive Function MMSE - Mini Mental State Exam 02/22/2019 02/20/2018 02/19/2017 02/12/2016  Orientation to time 5 5 5 5   Orientation to Place 5 5 5 5   Registration 3 3 3 3   Attention/ Calculation 5 0 0 0  Recall 3 2 1 3   Recall-comments - unable to recall 1 of 3 word unable to recall 2 of 3 words -  Language- name 2 objects - 0 0 0  Language- repeat 1 1 1 1   Language- follow 3 step command - 3 3 3   Language- read & follow direction - 0 0 0  Write a sentence - 0 0 0  Copy design - 0 0 0  Total score - 19 18 20   Mini Cog  Mini-Cog screen was completed. Maximum score is 22. A value of 0 denotes this part of the MMSE was not completed or the patient failed this part of the Mini-Cog screening.       Immunization History  Administered Date(s) Administered  . Fluad Quad(high Dose 65+) 11/04/2018  . Influenza Split 12/03/2011  . Influenza,inj,Quad PF,6+ Mos 11/06/2012, 01/25/2014, 02/08/2015, 01/03/2016, 02/17/2017, 01/22/2018  . Pneumococcal Conjugate-13 07/21/2013  . Pneumococcal Polysaccharide-23 03/11/2011  . Tdap 08/23/2017  . Zoster 03/17/2013    Qualifies for Shingles Vaccine? Yes  Screening Tests Health Maintenance  Topic Date Due  . FOOT EXAM  08/23/2018  . URINE MICROALBUMIN  03/28/2019  . HEMOGLOBIN A1C  05/01/2019  . OPHTHALMOLOGY  EXAM  01/03/2020  . COLONOSCOPY  11/18/2022  . DTaP/Tdap/Td (2 - Td) 08/24/2027  . TETANUS/TDAP  08/24/2027  . INFLUENZA VACCINE  Completed  . PNA vac Low Risk Adult  Completed   Cancer Screenings: Lung: Low Dose CT Chest recommended if Age 45-80 years, 30 pack-year currently smoking OR have quit w/in 15years. Patient does not qualify. Colorectal: completed 11/17/2017  Additional Screenings:  Hepatitis C Screening: N/A      Plan:    Patient will maintain and continue medications as prescribed.   I have personally reviewed and noted the following in the patient's chart:   . Medical and social history . Use of alcohol, tobacco or illicit drugs  . Current medications and supplements . Functional ability and status . Nutritional status . Physical activity . Advanced directives . List of other physicians . Hospitalizations, surgeries, and ER visits in previous 12 months . Vitals . Screenings to include cognitive, depression, and falls . Referrals and appointments  In addition, I have  reviewed and discussed with patient certain preventive protocols, quality metrics, and best practice recommendations. A written personalized care plan for preventive services as well as general preventive health recommendations were provided to patient.     Andrez Grime, LPN  [id number]

## 2019-02-22 NOTE — Progress Notes (Signed)
PCP notes:  Health Maintenance: Needs foot exam   Abnormal Screenings: none   Patient concerns: Patient needs a prescription for some new test strips that his insurance covers.    Nurse concerns: none   Next PCP appt.: 03/01/2019 @ 9:30 am

## 2019-02-23 ENCOUNTER — Encounter: Payer: Self-pay | Admitting: Oncology

## 2019-02-23 ENCOUNTER — Inpatient Hospital Stay (HOSPITAL_BASED_OUTPATIENT_CLINIC_OR_DEPARTMENT_OTHER): Payer: Medicare Other | Admitting: Oncology

## 2019-02-23 ENCOUNTER — Other Ambulatory Visit: Payer: Self-pay

## 2019-02-23 ENCOUNTER — Inpatient Hospital Stay: Payer: Medicare Other

## 2019-02-23 VITALS — BP 128/69 | HR 69 | Temp 94.6°F | Wt 154.6 lb

## 2019-02-23 DIAGNOSIS — C9110 Chronic lymphocytic leukemia of B-cell type not having achieved remission: Secondary | ICD-10-CM

## 2019-02-23 DIAGNOSIS — D649 Anemia, unspecified: Secondary | ICD-10-CM

## 2019-02-23 DIAGNOSIS — D693 Immune thrombocytopenic purpura: Secondary | ICD-10-CM | POA: Diagnosis not present

## 2019-02-23 DIAGNOSIS — I6523 Occlusion and stenosis of bilateral carotid arteries: Secondary | ICD-10-CM | POA: Diagnosis not present

## 2019-02-23 DIAGNOSIS — Z95828 Presence of other vascular implants and grafts: Secondary | ICD-10-CM

## 2019-02-23 LAB — COMPREHENSIVE METABOLIC PANEL
ALT: 18 U/L (ref 0–44)
AST: 22 U/L (ref 15–41)
Albumin: 4 g/dL (ref 3.5–5.0)
Alkaline Phosphatase: 84 U/L (ref 38–126)
Anion gap: 6 (ref 5–15)
BUN: 50 mg/dL — ABNORMAL HIGH (ref 8–23)
CO2: 29 mmol/L (ref 22–32)
Calcium: 9.7 mg/dL (ref 8.9–10.3)
Chloride: 106 mmol/L (ref 98–111)
Creatinine, Ser: 1.45 mg/dL — ABNORMAL HIGH (ref 0.61–1.24)
GFR calc Af Amer: 53 mL/min — ABNORMAL LOW (ref >60)
GFR calc non Af Amer: 46 mL/min — ABNORMAL LOW (ref >60)
Glucose, Bld: 223 mg/dL — ABNORMAL HIGH (ref 70–99)
Potassium: 4.2 mmol/L (ref 3.5–5.1)
Sodium: 141 mmol/L (ref 135–145)
Total Bilirubin: 0.5 mg/dL (ref 0.3–1.2)
Total Protein: 6.3 g/dL — ABNORMAL LOW (ref 6.5–8.1)

## 2019-02-23 LAB — CBC WITH DIFFERENTIAL/PLATELET
Abs Immature Granulocytes: 0.04 10*3/uL (ref 0.00–0.07)
Basophils Absolute: 0 10*3/uL (ref 0.0–0.1)
Basophils Relative: 0 %
Eosinophils Absolute: 0.1 10*3/uL (ref 0.0–0.5)
Eosinophils Relative: 5 %
HCT: 27.7 % — ABNORMAL LOW (ref 39.0–52.0)
Hemoglobin: 8.6 g/dL — ABNORMAL LOW (ref 13.0–17.0)
Immature Granulocytes: 2 %
Lymphocytes Relative: 11 %
Lymphs Abs: 0.3 10*3/uL — ABNORMAL LOW (ref 0.7–4.0)
MCH: 32.1 pg (ref 26.0–34.0)
MCHC: 31 g/dL (ref 30.0–36.0)
MCV: 103.4 fL — ABNORMAL HIGH (ref 80.0–100.0)
Monocytes Absolute: 0.3 10*3/uL (ref 0.1–1.0)
Monocytes Relative: 10 %
Neutro Abs: 1.8 10*3/uL (ref 1.7–7.7)
Neutrophils Relative %: 72 %
Platelets: 100 10*3/uL — ABNORMAL LOW (ref 150–400)
RBC: 2.68 MIL/uL — ABNORMAL LOW (ref 4.22–5.81)
RDW: 14.2 % (ref 11.5–15.5)
WBC: 2.5 10*3/uL — ABNORMAL LOW (ref 4.0–10.5)
nRBC: 0 % (ref 0.0–0.2)

## 2019-02-23 MED ORDER — SODIUM CHLORIDE 0.9% FLUSH
10.0000 mL | Freq: Once | INTRAVENOUS | Status: DC
  Filled 2019-02-23: qty 10

## 2019-02-23 NOTE — Progress Notes (Signed)
Patient prescreened for appointment. Patient reports he has concerns about his port, "sometimes it doesn't feel like."

## 2019-03-01 ENCOUNTER — Ambulatory Visit (INDEPENDENT_AMBULATORY_CARE_PROVIDER_SITE_OTHER): Payer: Medicare Other | Admitting: Family Medicine

## 2019-03-01 ENCOUNTER — Encounter: Payer: Self-pay | Admitting: Family Medicine

## 2019-03-01 ENCOUNTER — Other Ambulatory Visit: Payer: Self-pay

## 2019-03-01 VITALS — BP 118/64 | HR 64 | Temp 97.9°F | Ht 70.5 in | Wt 153.5 lb

## 2019-03-01 DIAGNOSIS — E039 Hypothyroidism, unspecified: Secondary | ICD-10-CM

## 2019-03-01 DIAGNOSIS — C9110 Chronic lymphocytic leukemia of B-cell type not having achieved remission: Secondary | ICD-10-CM | POA: Diagnosis not present

## 2019-03-01 DIAGNOSIS — I6523 Occlusion and stenosis of bilateral carotid arteries: Secondary | ICD-10-CM

## 2019-03-01 DIAGNOSIS — I10 Essential (primary) hypertension: Secondary | ICD-10-CM

## 2019-03-01 DIAGNOSIS — N1831 Chronic kidney disease, stage 3a: Secondary | ICD-10-CM

## 2019-03-01 DIAGNOSIS — D693 Immune thrombocytopenic purpura: Secondary | ICD-10-CM

## 2019-03-01 DIAGNOSIS — IMO0002 Reserved for concepts with insufficient information to code with codable children: Secondary | ICD-10-CM

## 2019-03-01 DIAGNOSIS — D631 Anemia in chronic kidney disease: Secondary | ICD-10-CM

## 2019-03-01 DIAGNOSIS — Z794 Long term (current) use of insulin: Secondary | ICD-10-CM

## 2019-03-01 DIAGNOSIS — E785 Hyperlipidemia, unspecified: Secondary | ICD-10-CM

## 2019-03-01 DIAGNOSIS — E1169 Type 2 diabetes mellitus with other specified complication: Secondary | ICD-10-CM

## 2019-03-01 DIAGNOSIS — E1142 Type 2 diabetes mellitus with diabetic polyneuropathy: Secondary | ICD-10-CM

## 2019-03-01 DIAGNOSIS — Z7189 Other specified counseling: Secondary | ICD-10-CM | POA: Diagnosis not present

## 2019-03-01 DIAGNOSIS — N182 Chronic kidney disease, stage 2 (mild): Secondary | ICD-10-CM

## 2019-03-01 DIAGNOSIS — E1165 Type 2 diabetes mellitus with hyperglycemia: Secondary | ICD-10-CM

## 2019-03-01 DIAGNOSIS — E1122 Type 2 diabetes mellitus with diabetic chronic kidney disease: Secondary | ICD-10-CM

## 2019-03-01 LAB — FRUCTOSAMINE: Fructosamine: 348 umol/L — ABNORMAL HIGH (ref 205–285)

## 2019-03-01 NOTE — Assessment & Plan Note (Signed)
Chronic, stable on carvedilol and amlodipine. Consider addition of ACEI (but caution in h/o hyperkalemia).

## 2019-03-01 NOTE — Assessment & Plan Note (Signed)
Continue to monitor kidney function.  

## 2019-03-01 NOTE — Assessment & Plan Note (Signed)
Advanced directives -previously wantedfull codebut with wife's death experience, likely would change decision. Has updated living will with lawyer - will bring [place] copy. HCPOA likely 2 sons.

## 2019-03-01 NOTE — Assessment & Plan Note (Signed)
Chronic, overall stable. Continue current regimen.  We called pharmacy - glucose strips are available for him.

## 2019-03-01 NOTE — Assessment & Plan Note (Addendum)
No bruits appreciated. Consider updated carotid US early next year. Continue statin.

## 2019-03-01 NOTE — Patient Instructions (Addendum)
If interested, check with pharmacy about new 2 shot shingles series (shingrix).  Bring me copy of your updated living will/advanced directive.  You are doing well today - continue current medicines.  Return as needed or in 4-6 months for diabetes follow up visit.   Health Maintenance After Age 78 After age 54, you are at a higher risk for certain long-term diseases and infections as well as injuries from falls. Falls are a major cause of broken bones and head injuries in people who are older than age 12. Getting regular preventive care can help to keep you healthy and well. Preventive care includes getting regular testing and making lifestyle changes as recommended by your health care provider. Talk with your health care provider about:  Which screenings and tests you should have. A screening is a test that checks for a disease when you have no symptoms.  A diet and exercise plan that is right for you. What should I know about screenings and tests to prevent falls? Screening and testing are the best ways to find a health problem early. Early diagnosis and treatment give you the best chance of managing medical conditions that are common after age 70. Certain conditions and lifestyle choices may make you more likely to have a fall. Your health care provider may recommend:  Regular vision checks. Poor vision and conditions such as cataracts can make you more likely to have a fall. If you wear glasses, make sure to get your prescription updated if your vision changes.  Medicine review. Work with your health care provider to regularly review all of the medicines you are taking, including over-the-counter medicines. Ask your health care provider about any side effects that may make you more likely to have a fall. Tell your health care provider if any medicines that you take make you feel dizzy or sleepy.  Osteoporosis screening. Osteoporosis is a condition that causes the bones to get weaker. This can  make the bones weak and cause them to break more easily.  Blood pressure screening. Blood pressure changes and medicines to control blood pressure can make you feel dizzy.  Strength and balance checks. Your health care provider may recommend certain tests to check your strength and balance while standing, walking, or changing positions.  Foot health exam. Foot pain and numbness, as well as not wearing proper footwear, can make you more likely to have a fall.  Depression screening. You may be more likely to have a fall if you have a fear of falling, feel emotionally low, or feel unable to do activities that you used to do.  Alcohol use screening. Using too much alcohol can affect your balance and may make you more likely to have a fall. What actions can I take to lower my risk of falls? General instructions  Talk with your health care provider about your risks for falling. Tell your health care provider if: ? You fall. Be sure to tell your health care provider about all falls, even ones that seem minor. ? You feel dizzy, sleepy, or off-balance.  Take over-the-counter and prescription medicines only as told by your health care provider. These include any supplements.  Eat a healthy diet and maintain a healthy weight. A healthy diet includes low-fat dairy products, low-fat (lean) meats, and fiber from whole grains, beans, and lots of fruits and vegetables. Home safety  Remove any tripping hazards, such as rugs, cords, and clutter.  Install safety equipment such as grab bars in bathrooms and safety  rails on stairs.  Keep rooms and walkways well-lit. Activity   Follow a regular exercise program to stay fit. This will help you maintain your balance. Ask your health care provider what types of exercise are appropriate for you.  If you need a cane or walker, use it as recommended by your health care provider.  Wear supportive shoes that have nonskid soles. Lifestyle  Do not drink  alcohol if your health care provider tells you not to drink.  If you drink alcohol, limit how much you have: ? 0-1 drink a day for women. ? 0-2 drinks a day for men.  Be aware of how much alcohol is in your drink. In the [place] one drink equals one typical bottle of beer (12 oz), one-half glass of wine (5 oz), or one shot of hard liquor (1 oz).  Do not use any products that contain nicotine or tobacco, such as cigarettes and e-cigarettes. If you need help quitting, ask your health care provider. Summary  Having a healthy lifestyle and getting preventive care can help to protect your health and wellness after age 30.  Screening and testing are the best way to find a health problem early and help you avoid having a fall. Early diagnosis and treatment give you the best chance for managing medical conditions that are more common for people who are older than age 68.  Falls are a major cause of broken bones and head injuries in people who are older than age 60. Take precautions to prevent a fall at home.  Work with your health care provider to learn what changes you can make to improve your health and wellness and to prevent falls. This information is not intended to replace advice given to you by your health care provider. Make sure you discuss any questions you have with your health care provider. Document Released: 01/01/2017 Document Revised: 06/11/2018 Document Reviewed: 01/01/2017 Elsevier Patient Education  2020 [employer].

## 2019-03-01 NOTE — Progress Notes (Signed)
This visit was conducted in person.  BP 118/64 (BP Location: Left Arm, Patient Position: Sitting, Cuff Size: Normal)   Pulse 64   Temp 97.9 F (36.6 C) (Temporal)   Ht 5' 10.5" (1.791 m)   Wt 153 lb 8 oz (69.6 kg)   SpO2 98%   BMI 21.71 kg/m    CC: CPE Subjective:    Patient ID: Scott [name], male    DOB: 1940/11/08, 78 y.o.   MRN: [id number]  HPI: Scott [name] is a 78 y.o. male presenting on 03/01/2019 for Annual Exam (Prt 2.  Provided a log (to keep) hor recent BP and BS readings.  States insurance no longer covers test strips. )   Saw health advisor last week for medicare wellness visit. Note reviewed.   Has met new lady friend he is spending time with and enjoys this.  Overall feeling well.   No exam data present    Clinical Support from 02/22/2019 in [place] at [facility] Total Score  0      Fall Risk  02/22/2019 12/25/2018 12/08/2018 10/26/2018 03/19/2018  Falls in the past year? 1 0 0 0 0  Comment tripped out of shower - - - -  Number falls in past yr: 0 0 - - -  Comment - - - - -  Injury with Fall? 0 0 - - -  Risk for fall due to : Medication side effect - - - -  Follow up Falls evaluation completed;Falls prevention discussed - - - -    Brings log of blood pressures and sugars which was reviewed. Amlodipine recently added.   CLL - followed by onc, concern for progression of disease however latest plt and Hgb counts were actually improved.  Thrombocytopenia not responsive to IVIG, high dose predinsone, and rituxan.  Anemia requiring transfusions - latest EGD 11/2017 showed non bleeding erosive gastropathy with multiple nonbleeding duodenal ulcers. No transfusions in the past month.   DM - levemir 25 u at night, novolog 5u with meals, 10u if cbg >250. Having trouble finding affordable test strips - out of test strips for last 3-4 wks.  Lab Results  Component Value Date   HGBA1C 7.9 (H) 02/22/2019      Preventative: ESOPHAGOGASTRODUODENOSCOPY (EGD) WITH PROPOFOL 11/17/2017 - healing erosive gastritis, intestinal metaplasia, neg H pylori (Vanga, Tally Due, MD) COLONOSCOPY WITH PROPOFOL 11/17/2017 - TA, HP, (Vanga, Tally Due, MD) Prostate screening - normal DRE and PSA2015-decided to age out. Flu shotyearly Pneumovax Apr 22, 2011. prevnar 07/2013 Tdap 08/2017 zostavax 2013-04-21 shingrix - discussed, declines  Advanced directives -previously wantedfull codebut with wife's death experience, likely would change decision. Has updated living will with lawyer - will bring [place] copy. HCPOA likely 2 sons.  Seat belt use discussed. Sunscreen use discussed.No changing moles on skin.  Ex smoker Alcohol use - none Dentist - has dentures, new ones Eye exam yearly  Bowel - no constipation - takes stool softener PRN Bladder - no incontinence  Lives alone - widower 04/22/2015, dog also passed away Occupation: retired, was route Hotel manager Edu: 11th gr Activity: some treadmill  Diet: drinks diet coke, no vegetables     Relevant past medical, surgical, family and social history reviewed and updated as indicated. Interim medical history since our last visit reviewed. Allergies and medications reviewed and updated. Outpatient Medications Prior to Visit  Medication Sig Dispense Refill  . acetaminophen (TYLENOL) 650 MG CR tablet Take 1,300 mg by mouth 2 (two) times daily.     Marland Kitchen  amLODipine (NORVASC) 5 MG tablet Take 1 tablet (5 mg total) by mouth daily. 90 tablet 3  . atorvastatin (LIPITOR) 80 MG tablet Take 1 tablet by mouth once daily 90 tablet 0  . carvedilol (COREG) 6.25 MG tablet Take 1 tablet (6.25 mg total) by mouth 2 (two) times daily with a meal. 180 tablet 3  . clotrimazole (LOTRIMIN) 1 % cream Apply 1 application topically 2 (two) times daily. 80 g 0  . docusate sodium (COLACE) 100 MG capsule Take 100 mg by mouth daily as needed.     . glucose blood (ONETOUCH ULTRA) test strip USE 1 STRIP TO CHECK  GLUCOSE 4 TIMES DAILY AND  AS  NEEDED  TO  CHECK  SUGARS 300 each 0  . insulin aspart (NOVOLOG FLEXPEN) 100 UNIT/ML FlexPen Take 5 units with meals, take 10 units if mealtime sugar >250 (Patient taking differently: Inject 5-10 Units into the skin See admin instructions. Take 5 units with meals, take 10 units if mealtime sugar >250) 15 mL 11  . LEVEMIR FLEXTOUCH 100 UNIT/ML Pen INJECT 25 UNITS SUBCUTANEOUSLY ONCE DAILY AT  10PM 15 mL 0  . levothyroxine (SYNTHROID) 88 MCG tablet Take 1 tablet by mouth once daily 90 tablet 0  . lidocaine-prilocaine (EMLA) cream Apply 1 application topically as needed. To port site prior to access 30 g 2  . nitroGLYCERIN (NITROSTAT) 0.4 MG SL tablet Place 1 tablet (0.4 mg total) under the tongue every 5 (five) minutes as needed for chest pain ((MAX of 3 doses)). 25 tablet 0  . oxymetazoline (AFRIN) 0.05 % nasal spray Place 1 spray into left nostril daily as needed (nose bleeds).    . traZODone (DESYREL) 100 MG tablet TAKE 1 TABLET BY MOUTH AT BEDTIME AS NEEDED FOR SLEEP 90 tablet 0  . ondansetron (ZOFRAN) 8 MG tablet Take 1 tablet (8 mg total) by mouth 2 (two) times daily as needed for refractory nausea / vomiting. (Patient not taking: Reported on 02/23/2019) 30 tablet 2  . oxyCODONE (OXY IR/ROXICODONE) 5 MG immediate release tablet Take 1 tablet (5 mg total) by mouth every 4 (four) hours as needed for severe pain. (Patient not taking: Reported on 02/23/2019) 20 tablet 0  . prochlorperazine (COMPAZINE) 10 MG tablet Take 1 tablet (10 mg total) by mouth every 6 (six) hours as needed (Nausea or vomiting). 60 tablet 2   Facility-Administered Medications Prior to Visit  Medication Dose Route Frequency Provider Last Rate Last Admin  . sodium chloride flush (NS) 0.9 % injection 10 mL  10 mL Intravenous Once Finnegan, Timothy J, MD         Per HPI unless specifically indicated in ROS section below Review of Systems Objective:    BP 118/64 (BP Location: Left Arm, Patient  Position: Sitting, Cuff Size: Normal)   Pulse 64   Temp 97.9 F (36.6 C) (Temporal)   Ht 5' 10.5" (1.791 m)   Wt 153 lb 8 oz (69.6 kg)   SpO2 98%   BMI 21.71 kg/m   Wt Readings from Last 3 Encounters:  03/01/19 153 lb 8 oz (69.6 kg)  02/23/19 154 lb 9.6 oz (70.1 kg)  02/22/19 161 lb (73 kg)    Physical Exam Vitals and nursing note reviewed.  Constitutional:      General: He is not in acute distress.    Appearance: Normal appearance. He is well-developed. He is not ill-appearing.  HENT:     Head: Normocephalic and atraumatic.     Right Ear: Hearing,   tympanic membrane, ear canal and external ear normal.     Left Ear: Hearing, tympanic membrane, ear canal and external ear normal.     Mouth/Throat:     Pharynx: Uvula midline.  Eyes:     General: No scleral icterus.    Extraocular Movements: Extraocular movements intact.     Conjunctiva/sclera: Conjunctivae normal.     Pupils: Pupils are equal, round, and reactive to light.  Cardiovascular:     Rate and Rhythm: Normal rate and regular rhythm.     Pulses: Normal pulses.          Radial pulses are 2+ on the right side and 2+ on the left side.     Heart sounds: Normal heart sounds. No murmur.  Pulmonary:     Effort: Pulmonary effort is normal. No respiratory distress.     Breath sounds: Normal breath sounds. No wheezing, rhonchi or rales.  Abdominal:     General: Abdomen is flat. Bowel sounds are normal. There is no distension.     Palpations: Abdomen is soft. There is no mass.     Tenderness: There is no abdominal tenderness. There is no guarding or rebound.     Hernia: No hernia is present.  Musculoskeletal:        General: Normal range of motion.     Cervical back: Normal range of motion and neck supple.     Right lower leg: No edema.     Left lower leg: No edema.  Lymphadenopathy:     Cervical: No cervical adenopathy.  Skin:    General: Skin is warm and dry.     Findings: No rash.  Neurological:     General: No focal  deficit present.     Mental Status: He is alert and oriented to person, place, and time.     Comments: CN grossly intact, station and gait intact  Psychiatric:        Mood and Affect: Mood normal.        Behavior: Behavior normal.        Thought Content: Thought content normal.        Judgment: Judgment normal.        Diabetic Foot Exam - Simple   Simple Foot Form Diabetic Foot exam was performed with the following findings: Yes 03/01/2019 10:02 AM  Visual Inspection No deformities, no ulcerations, no other skin breakdown bilaterally: Yes Sensation Testing Intact to touch and monofilament testing bilaterally: Yes Pulse Check Posterior Tibialis and Dorsalis pulse intact bilaterally: Yes Comments     Results for orders placed or performed in visit on 02/23/19  Comprehensive metabolic panel  Result Value Ref Range   Sodium 141 135 - 145 mmol/L   Potassium 4.2 3.5 - 5.1 mmol/L   Chloride 106 98 - 111 mmol/L   CO2 29 22 - 32 mmol/L   Glucose, Bld 223 (H) 70 - 99 mg/dL   BUN 50 (H) 8 - 23 mg/dL   Creatinine, Ser 1.45 (H) 0.61 - 1.24 mg/dL   Calcium 9.7 8.9 - 10.3 mg/dL   Total Protein 6.3 (L) 6.5 - 8.1 g/dL   Albumin 4.0 3.5 - 5.0 g/dL   AST 22 15 - 41 U/L   ALT 18 0 - 44 U/L   Alkaline Phosphatase 84 38 - 126 U/L   Total Bilirubin 0.5 0.3 - 1.2 mg/dL   GFR calc non Af Amer 46 (L) >60 mL/min   GFR calc Af Amer 53 (L) >60 mL/min  Anion gap 6 5 - 15  CBC with Differential  Result Value Ref Range   WBC 2.5 (L) 4.0 - 10.5 K/uL   RBC 2.68 (L) 4.22 - 5.81 MIL/uL   Hemoglobin 8.6 (L) 13.0 - 17.0 g/dL   HCT 27.7 (L) 39.0 - 52.0 %   MCV 103.4 (H) 80.0 - 100.0 fL   MCH 32.1 26.0 - 34.0 pg   MCHC 31.0 30.0 - 36.0 g/dL   RDW 14.2 11.5 - 15.5 %   Platelets 100 (L) 150 - 400 K/uL   nRBC 0.0 0.0 - 0.2 %   Neutrophils Relative % 72 %   Neutro Abs 1.8 1.7 - 7.7 K/uL   Lymphocytes Relative 11 %   Lymphs Abs 0.3 (L) 0.7 - 4.0 K/uL   Monocytes Relative 10 %   Monocytes Absolute  0.3 0.1 - 1.0 K/uL   Eosinophils Relative 5 %   Eosinophils Absolute 0.1 0.0 - 0.5 K/uL   Basophils Relative 0 %   Basophils Absolute 0.0 0.0 - 0.1 K/uL   Immature Granulocytes 2 %   Abs Immature Granulocytes 0.04 0.00 - 0.07 K/uL   Assessment & Plan:  This visit occurred during the SARS-CoV-2 public health emergency.  Safety protocols were in place, including screening questions prior to the visit, additional usage of staff PPE, and extensive cleaning of exam room while observing appropriate contact time as indicated for disinfecting solutions.   Problem List Items Addressed This Visit    Uncontrolled diabetes mellitus with stage 2 chronic kidney disease, with long-term current use of insulin (HCC)    Chronic, overall stable. Continue current regimen.  We called pharmacy - glucose strips are available for him.       Idiopathic thrombocytopenic purpura (ITP) (HCC)   Hypothyroidism    Chronic, stable on current levothyroxine dose.       Hypertension    Chronic, stable on carvedilol and amlodipine. Consider addition of ACEI (but caution in h/o hyperkalemia).       Hyperlipidemia associated with type 2 diabetes mellitus (HCC)    Chronic, stable on current regimen - continue.  The ASCVD Risk score (Goff DC Jr., et al., 2013) failed to calculate for the following reasons:   The patient has a prior MI or stroke diagnosis       Diabetic neuropathy (HCC)   CLL (chronic lymphocytic leukemia) (HCC)    Appreciate onc care. Overall stable period      CKD (chronic kidney disease) stage 3, GFR 30-59 ml/min (HCC)    Continue to monitor kidney function.       Carotid stenosis, asymptomatic, bilateral    No bruits appreciated. Consider updated carotid US early next year. Continue statin.       Anemia in CKD (chronic kidney disease)    Stable period, known CLL. Appreciate onc care.       Advanced care planning/counseling discussion - Primary    Advanced directives -previously  wantedfull codebut with wife's death experience, likely would change decision. Has updated living will with lawyer - will bring us copy. HCPOA likely 2 sons.           No orders of the defined types were placed in this encounter.  No orders of the defined types were placed in this encounter.   Patient instructions: If interested, check with pharmacy about new 2 shot shingles series (shingrix).  Bring me copy of your updated living will/advanced directive.  You are doing well today - continue current medicines.    Return as needed or in 4-6 months for diabetes follow up visit.   Follow up plan: Return in about 6 months (around 08/30/2019) for follow up visit.  Javier Gutierrez, MD   

## 2019-03-01 NOTE — Assessment & Plan Note (Addendum)
Stable period, known CLL. Appreciate onc care.

## 2019-03-01 NOTE — Assessment & Plan Note (Signed)
Appreciate onc care. Overall stable period

## 2019-03-01 NOTE — Assessment & Plan Note (Signed)
Chronic, stable on current regimen - continue.  The ASCVD Risk score Scott Bussing DC Jr., et al., 2013) failed to calculate for the following reasons:   The patient has a prior MI or stroke diagnosis

## 2019-03-01 NOTE — Assessment & Plan Note (Signed)
Chronic, stable on current levothyroxine dose  

## 2019-03-10 ENCOUNTER — Other Ambulatory Visit: Payer: Self-pay | Admitting: Family Medicine

## 2019-03-10 DIAGNOSIS — E1121 Type 2 diabetes mellitus with diabetic nephropathy: Secondary | ICD-10-CM

## 2019-03-10 DIAGNOSIS — IMO0002 Reserved for concepts with insufficient information to code with codable children: Secondary | ICD-10-CM

## 2019-03-10 MED ORDER — ONETOUCH ULTRA VI STRP: ORAL_STRIP | 5 refills | Status: DC

## 2019-03-10 NOTE — Telephone Encounter (Signed)
Per pharmacy need to resend one touch ultra strip  Without as needed directions per medicare. RX adjusted and resent

## 2019-03-22 ENCOUNTER — Other Ambulatory Visit: Payer: Self-pay | Admitting: Family Medicine

## 2019-03-22 DIAGNOSIS — E02 Subclinical iodine-deficiency hypothyroidism: Secondary | ICD-10-CM

## 2019-03-23 ENCOUNTER — Other Ambulatory Visit: Payer: Self-pay

## 2019-03-23 ENCOUNTER — Inpatient Hospital Stay: Payer: Medicare Other | Attending: Oncology

## 2019-03-23 ENCOUNTER — Encounter: Payer: Self-pay | Admitting: Family Medicine

## 2019-03-23 ENCOUNTER — Inpatient Hospital Stay: Payer: Medicare Other

## 2019-03-23 DIAGNOSIS — IMO0002 Reserved for concepts with insufficient information to code with codable children: Secondary | ICD-10-CM

## 2019-03-23 DIAGNOSIS — Z95828 Presence of other vascular implants and grafts: Secondary | ICD-10-CM

## 2019-03-23 DIAGNOSIS — C9110 Chronic lymphocytic leukemia of B-cell type not having achieved remission: Secondary | ICD-10-CM | POA: Insufficient documentation

## 2019-03-23 DIAGNOSIS — D649 Anemia, unspecified: Secondary | ICD-10-CM

## 2019-03-23 DIAGNOSIS — E1121 Type 2 diabetes mellitus with diabetic nephropathy: Secondary | ICD-10-CM

## 2019-03-23 LAB — COMPREHENSIVE METABOLIC PANEL
ALT: 29 U/L (ref 0–44)
AST: 32 U/L (ref 15–41)
Albumin: 4.2 g/dL (ref 3.5–5.0)
Alkaline Phosphatase: 83 U/L (ref 38–126)
Anion gap: 10 (ref 5–15)
BUN: 42 mg/dL — ABNORMAL HIGH (ref 8–23)
CO2: 25 mmol/L (ref 22–32)
Calcium: 9.9 mg/dL (ref 8.9–10.3)
Chloride: 110 mmol/L (ref 98–111)
Creatinine, Ser: 1.58 mg/dL — ABNORMAL HIGH (ref 0.61–1.24)
GFR calc Af Amer: 48 mL/min — ABNORMAL LOW (ref >60)
GFR calc non Af Amer: 41 mL/min — ABNORMAL LOW (ref >60)
Glucose, Bld: 83 mg/dL (ref 70–99)
Potassium: 4.4 mmol/L (ref 3.5–5.1)
Sodium: 145 mmol/L (ref 135–145)
Total Bilirubin: 0.5 mg/dL (ref 0.3–1.2)
Total Protein: 6.8 g/dL (ref 6.5–8.1)

## 2019-03-23 LAB — CBC WITH DIFFERENTIAL/PLATELET
Abs Immature Granulocytes: 0.06 10*3/uL (ref 0.00–0.07)
Basophils Absolute: 0 10*3/uL (ref 0.0–0.1)
Basophils Relative: 1 %
Eosinophils Absolute: 0.1 10*3/uL (ref 0.0–0.5)
Eosinophils Relative: 4 %
HCT: 30.9 % — ABNORMAL LOW (ref 39.0–52.0)
Hemoglobin: 9.8 g/dL — ABNORMAL LOW (ref 13.0–17.0)
Immature Granulocytes: 2 %
Lymphocytes Relative: 13 %
Lymphs Abs: 0.4 10*3/uL — ABNORMAL LOW (ref 0.7–4.0)
MCH: 31.7 pg (ref 26.0–34.0)
MCHC: 31.7 g/dL (ref 30.0–36.0)
MCV: 100 fL (ref 80.0–100.0)
Monocytes Absolute: 0.4 10*3/uL (ref 0.1–1.0)
Monocytes Relative: 13 %
Neutro Abs: 2 10*3/uL (ref 1.7–7.7)
Neutrophils Relative %: 67 %
Platelets: 107 10*3/uL — ABNORMAL LOW (ref 150–400)
RBC: 3.09 MIL/uL — ABNORMAL LOW (ref 4.22–5.81)
RDW: 12.5 % (ref 11.5–15.5)
WBC: 2.9 10*3/uL — ABNORMAL LOW (ref 4.0–10.5)
nRBC: 0 % (ref 0.0–0.2)

## 2019-03-23 MED ORDER — HEPARIN SOD (PORK) LOCK FLUSH 100 UNIT/ML IV SOLN
500.0000 [IU] | Freq: Once | INTRAVENOUS | Status: AC
  Administered 2019-03-23: 500 [IU] via INTRAVENOUS
  Filled 2019-03-23: qty 5

## 2019-03-23 MED ORDER — SODIUM CHLORIDE 0.9% FLUSH
10.0000 mL | Freq: Once | INTRAVENOUS | Status: AC
  Administered 2019-03-23: 08:00:00 10 mL via INTRAVENOUS
  Filled 2019-03-23: qty 10

## 2019-03-25 ENCOUNTER — Telehealth: Payer: Self-pay

## 2019-03-25 MED ORDER — ONETOUCH ULTRA VI STRP: ORAL_STRIP | 1 refills | Status: DC

## 2019-03-25 NOTE — Telephone Encounter (Signed)
Indication for QID checking is brittle diabetes with hyper and hypoglycemia.  But plz give ok to fill TID so he can at least get some.

## 2019-03-25 NOTE — Telephone Encounter (Signed)
plz call pharmacy to see what's going on. We called pharmacy last month and they said they had them available for him. He is getting a different story from the pharmacy.  We refilled test strips per pharmacy specifications on 03/10/2019 (see refill encounter)

## 2019-03-25 NOTE — Telephone Encounter (Signed)
Telephone call to patients son [name] to schedule palliative care visit.  Son asks if nurse has spoke with patient about palliative care.  Son informed he was listed as contact person.  Son calls patient and patient in agreement with palliative team visiting on 03/30/19 at 2:30 PM.

## 2019-03-25 NOTE — Addendum Note (Signed)
Addended by: Brenton Grills on: [id number] AM   Modules accepted: Orders

## 2019-03-25 NOTE — Telephone Encounter (Addendum)
Spoke with pharmacy asking about refill.  Pharmacist states they still have not received the 'ok' to fill rx to check BS QID.  However, they can fill rx for pt to check BS TID.  Is it ok to send? By the way, when I asked about previously being told test strips were filled and ready, she did not have an answer and apologized about the misinformation.   FYI to Dr. Darnell Level.  I am also trying to find a way to contact [employer] office where info was faxed (2x's) but there is no contact number and the pharmacist at local [place] did not see a number in the note they had.

## 2019-03-25 NOTE — Telephone Encounter (Signed)
Noted.  Sent rx for with directions to check BS TID.  Notified pt via [place].

## 2019-03-30 ENCOUNTER — Telehealth: Payer: Self-pay | Admitting: *Deleted

## 2019-03-30 ENCOUNTER — Other Ambulatory Visit: Payer: Self-pay

## 2019-03-30 ENCOUNTER — Other Ambulatory Visit: Payer: Medicare Other

## 2019-03-30 DIAGNOSIS — Z515 Encounter for palliative care: Secondary | ICD-10-CM

## 2019-03-30 NOTE — Progress Notes (Signed)
COMMUNITY PALLIATIVE CARE SW NOTE  PATIENT NAME: Scott [name] DOB: Jul 03, 1940 MRN: [id number]  PRIMARY CARE PROVIDER: [name], MD  RESPONSIBLE PARTY:  Acct ID - Guarantor Home Phone Work Phone Relationship Acct Type  [id number] - Borin,[name]  Self P/F     [address], [place], [place] [address]     PLAN OF CARE and INTERVENTIONS:             1. GOALS OF CARE/ ADVANCE CARE PLANNING:  Patient is a FULL CODE. Patient has Living Will. HCPOA is two of his sons - including Scott [name] (patient's son). SW requested copies. Goal is to remain at home and continue to improve. 2. SOCIAL/EMOTIONAL/SPIRITUAL ASSESSMENT/ INTERVENTIONS:  SW and RN met with patient, Scott [name] and Scott [name] (patient's girlfriend) in the home. Patient provides brief medical history. Patient has multiple myeloma and reports that his platelets have improved the past few months. Patient denies pain. Patient reports that his appetite is good. Patient showed team his log of his blood sugars and vital signs that he checks daily. Patient said he is sleeping well. Denies falls. Patient is a widower, and lives alone. Maevis visits daily. 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 Patient has four adult children, all reside in [place] area. Patient said he has 12 grandchildren. Patient enjoys his two dogs, being outdoors and spending time with Maevis. SW provided education on palliative care services, discussed goals of care and provided supportive counseling.  3. PATIENT/CAREGIVER EDUCATION/ COPING:  Patient was alert, oriented and engaged in conversation. Patient was open in expressing his feelings with team. Patient is hopeful with is recent good report from MD. Family is supportive.  4. PERSONAL EMERGENCY PLAN:  Family will call 9-1-1 for emergencies. 5. COMMUNITY RESOURCES COORDINATION/ HEALTH CARE NAVIGATION:  Scott [name] helps to coordinate care and transport to appointments.  6. FINANCIAL/LEGAL  CONCERNS/INTERVENTIONS:  None.     SOCIAL HX:  Social History   Tobacco Use  . Smoking status: Former Smoker    Quit date: 03/04/1978    Years since quitting: 41.0  . Smokeless tobacco: Never Used  Substance Use Topics  . Alcohol use: No    CODE STATUS:   Code Status: Prior (FULL) ADVANCED DIRECTIVES: Y MOST FORM COMPLETE:  No. HOSPICE EDUCATION PROVIDED: None.  PPS: Patient is mostly independent of ADLs.  I spent 45 minutes with patient/family, from 2:45-3:45p providing education, support and consultation.   Margaretmary Lombard, LCSW

## 2019-03-30 NOTE — Telephone Encounter (Signed)
Ok to change formulation. We never received [facility] request.  plz schedule 6 wk labs to recheck thyroid function on new dose.   Second time [facility] has dropped the ball in 1 week week for this patient (see mychart message on sugar testing strips).

## 2019-03-30 NOTE — Progress Notes (Signed)
PATIENT NAME: Scott [name] DOB: 03/24/1940 MRN: [id number]  PRIMARY CARE PROVIDER: [name], MD  RESPONSIBLE PARTY:  Acct ID - Guarantor Home Phone Work Phone Relationship Acct Type  [id number] - [name],[name]  Self P/F     [address], [place], [place] [address]    PLAN OF CARE and INTERVENTIONS:               1.  GOALS OF CARE/ ADVANCE CARE PLANNING:  Remain at home and continue receiving care/treatment at cancer center.               2.  PATIENT/CAREGIVER EDUCATION:  Education on fall precautions, education on bleeding precautions, education on s/s of infection, support                3.  DISEASE STATUS: SW and RN made scheduled palliative care home care visit. Palliative care team met with patient, patients son [name] and patients friend [name] to explain palliative care services. Patient is a 79 year old patient of Dr. Gary Fleet with multiple myeloma. Patient reports his platelets have been better than they have been in several months. Patient reports he has had to receive blood transfusions in the past. Patient continues to have nose bleeds due to low platelet count. Patient denies having any pain. Patient also denies suffering any recent falls.  Patient states he wants to continue going to cancer center and receiving treatments as long as he is able. Patients son supports patients decision and is in agreement with patient being a Full Code. Patient reports his appetite is good and patient has put weight on since his friend [name] cooks for him. Patient reports his current weight is 153 pounds. Patient denies having any cough or shortness of breath. Patient checks his blood sugar and vital signs as his weight everyday. Patient knows his medications and what medications are takes for.  Patient reports his Synthroid was not at [place] informed him they needed to contact MD for new order as they had switched Synthroid manufacturers. Nurse called doctor Gutierrez's  office and requested MD contact Pharmacy regarding patients Synthroid as patient has been out of medication all week. Patients sugar has been over 400 when he has to take steroids before treatment at the [facility].Patient reports his blood sugar this AM was 191 and 135 at lunch.   Patient's vital signs are stable. Patient in agreement with palliative care services. Patient has a living will and [place].  Support provided to patient and family. Nurse reviewed patient's medications with patient. Patient and family encouraged to contact palliative care with questions or concerns.    HISTORY OF PRESENT ILLNESS: Patient is a 79 year old patient who resides in his own home.  Patient agreeable to palliative care services.  Patient will be seen monthly and PRN.      CODE STATUS: Full Code  ADVANCED DIRECTIVES: Y MOST FORM: No PPS: 60%   PHYSICAL EXAM:   VITALS: Today's Vitals   03/30/19 1526  BP: 130/70  Pulse: 76  Resp: 18  Temp: 97.7 F (36.5 C)  TempSrc: Temporal  SpO2: 98%  Weight: 153 lb (69.4 kg)  Height: '5\' 10"'  (1.778 m)  PainSc: 0-No pain    LUNGS: clear to auscultation  CARDIAC: Cor RRR  EXTREMITIES: none edema SKIN: Skin color, texture, turgor normal. No rashes or lesions  NEURO: Alert and oriented x 4       Nilda Simmer, RN

## 2019-03-30 NOTE — Telephone Encounter (Signed)
Virgie nurse with [facility] called stating that patient has been without his Synthroid 88 mcg all week. Virgie stated that it sounds like they are changing manufacturers and needs the doctor's approval to make the change. Virgie thought that [facility] was reaching out to  Dr. Danise Mina about this. Virgie requested that Dr. Danise Mina approve the change so that the patient can get his Synthroid. Virgie requested that the patient be notified when this has been done.   [facility] [address]

## 2019-03-31 ENCOUNTER — Encounter: Payer: Self-pay | Admitting: Family Medicine

## 2019-04-01 NOTE — Telephone Encounter (Addendum)
Spoke with [facility] informing them Dr. Darnell Level authorizes change in manufacturer for levothyroxine.   Verbalizes understanding and will prepare for pt.  Also, I asked about approval for form for pt test strips to test QID.  They provided Medicare Help Desk [phone].    Spoke with Vaughan Basta and was told that number is just for pharmacies to call when trying to process a claim.

## 2019-04-01 NOTE — Telephone Encounter (Signed)
Lvm for pt to call back.  Need to inform him the manufacturer has changed for the levothyroxine and the pharmacy is filling that for him today.  Also, pt needs a 6 wk lab visit.

## 2019-04-01 NOTE — Telephone Encounter (Signed)
Pt returning call.  Says he was notified by [facility] about the change.  Scheduled lab visit on 05/14/19 at 10:00.

## 2019-04-02 ENCOUNTER — Encounter: Payer: Self-pay | Admitting: Oncology
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
## 2019-04-13 ENCOUNTER — Other Ambulatory Visit: Payer: Self-pay | Admitting: Family Medicine

## 2019-04-22 NOTE — Progress Notes (Signed)
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  INTERVAL HISTORY: Patient returns to clinic today for repeat laboratory work and further evaluation.  He currently feels well and is asymptomatic.  He does not complain of weakness or fatigue today. He does not complain of any easy bleeding or bruising. He denies any recent fevers or illnesses.  He has no neurologic complaints.  He has a good appetite and denies weight loss. He denies any night sweats. He has noted no lymphadenopathy.  He has no chest pain, shortness of breath, cough, or hemoptysis.  He denies any nausea, vomiting, constipation, or diarrhea. He has no urinary complaints.  Patient offers no specific complaints today.  REVIEW OF SYSTEMS:   Review of Systems  Constitutional: Negative.  Negative for fever, malaise/fatigue and weight loss.  HENT: Negative.  Negative for nosebleeds.   Respiratory: Negative.  Negative for cough and shortness of breath.   Cardiovascular: Negative.  Negative for chest pain and leg swelling.  Gastrointestinal: Negative.  Negative for abdominal pain, blood in stool and melena.  Genitourinary: Negative.  Negative for dysuria and hematuria.  Musculoskeletal: Negative.  Negative for back pain and neck pain.  Skin: Negative.  Negative for itching and rash.  Neurological: Negative.  Negative for dizziness, sensory change, focal weakness, weakness and headaches.  Endo/Heme/Allergies:  Negative.  Does not bruise/bleed easily.  Psychiatric/Behavioral: Negative.  The patient is not nervous/anxious.     As per HPI. Otherwise, a complete review of systems is negative.  PAST MEDICAL HISTORY: Past Medical History:  Diagnosis Date  . Acquired hand deformity 1962   hand saw accident at work  . Anemia 10/11/2011  . Arthritis   . Bradycardia 10/10/2011  . CAD (coronary artery disease) 10/10/2011   MI s/p PTCA (Dx-OM2 proximal concentric stenosis)  . Carotid artery disease ([place])   . Chronic edema   . CKD (chronic kidney disease) stage 3, GFR 30-59 ml/min    [name]  . CLL (chronic lymphocytic leukemia) ([place])   . Colon polyps   . Generalized headaches    frequent  . GI bleed 07/08/2017  . Glaucoma    s/p laser surgery  . HLD (hyperlipidemia)   . Hypertension   . Hypothyroidism 10/10/2011  . Thrombocytopenia ([place]) 10/11/2011  . Type 2 diabetes with nephropathy [facility])    DM refresher course [facility] (04/2013)    PAST SURGICAL HISTORY: Past Surgical History:  Procedure Laterality Date  . BACK SURGERY     cervical neck  . CATARACT EXTRACTION, BILATERAL Bilateral 2017  . COLONOSCOPY  11/2008   1 polyp, diverticulosis, rec rpt 5 yrs (Dr. Oletta Lamas, [name])  . COLONOSCOPY  06/2014   hyperplastic polyp, rpt 5 yrs ([place])  . COLONOSCOPY WITH PROPOFOL N/A 11/17/2017   TA, HP, (Vanga, Tally Due, MD)  . ESOPHAGOGASTRODUODENOSCOPY (EGD) WITH PROPOFOL N/A 11/17/2017   healing erosive gastritis, intestinal metaplasia, neg H pylori (Vanga, Tally Due, MD)  . EYE SURGERY  2012   laser surgery for glaucoma  . PORTACATH PLACEMENT Left 12/09/2018   Procedure: INSERTION PORT-A-CATH;  Surgeon: [name], MD;  Location: [facility] ORS;  Service: General;  Laterality: Left;  . PTCA  1994, 1995  . US ECHOCARDIOGRAPHY  10/2013   inferior wall hypokinesis, mild LVH, EF 50-55%, mild MR and LA dilation    FAMILY HISTORY: Family History  Problem Relation Age of Onset  . Diabetes Sister   . Stomach  cancer Sister 7  . Pneumonia Father        caused death  . Other Brother        no communication with brother so unsure of any health conditions  . Coronary artery disease Son 75       5v CABG and stents  . Hyperlipidemia Sister   . Stroke Neg Hx   . Heart attack Neg Hx     ADVANCED DIRECTIVES (Y/N):  N  HEALTH MAINTENANCE: Social History   Tobacco Use  . Smoking status: Former Smoker    Quit date: 03/04/1978    Years since quitting: 41.1  . Smokeless tobacco: Never Used  Substance Use Topics  . Alcohol use: No  . Drug use: No     Colonoscopy:  PAP:  Bone density:  Lipid panel:  No Known Allergies  Current Outpatient Medications  Medication Sig Dispense Refill  . acetaminophen (TYLENOL) 650 MG CR tablet Take 1,300 mg by mouth 2 (two) times daily.     Marland Kitchen amLODipine (NORVASC) 5 MG tablet Take 1 tablet (5 mg total) by mouth daily. 90 tablet 3  . atorvastatin (LIPITOR) 80 MG tablet Take 1 tablet by mouth once daily 90 tablet 3  . carvedilol (COREG) 6.25 MG tablet Take 1 tablet (6.25 mg total) by mouth 2 (two) times daily with a meal. 180 tablet 3  . clotrimazole (LOTRIMIN) 1 % cream Apply 1 application topically 2 (two) times daily. 80 g 0  . docusate sodium (COLACE) 100 MG capsule Take 100 mg by mouth daily as needed.     Marland Kitchen glucose blood (ONETOUCH ULTRA) test strip USE 1 STRIP TO CHECK GLUCOSE 3 TIMES DAILY 300 each 1  . insulin aspart (NOVOLOG FLEXPEN) 100 UNIT/ML FlexPen Take 5 units with meals, take 10 units if mealtime sugar >250 (Patient taking differently: Inject 5-10 Units into the skin See admin instructions. Take 5 units with meals, take 10 units if mealtime sugar >250) 15 mL 11  . LEVEMIR FLEXTOUCH 100 UNIT/ML Pen INJECT 25 UNITS SUBCUTANEOUSLY ONCE DAILY AT  10PM 15 mL 2  . levothyroxine (SYNTHROID) 88 MCG tablet Take 1 tablet by mouth once daily 90 tablet 1  . lidocaine-prilocaine (EMLA) cream Apply 1 application topically as needed. To port site prior to access  30 g 2  . nitroGLYCERIN (NITROSTAT) 0.4 MG SL tablet Place 1 tablet (0.4 mg total) under the tongue every 5 (five) minutes as needed for chest pain ((MAX of 3 doses)). 25 tablet 0  . oxymetazoline (AFRIN) 0.05 % nasal spray Place 1 spray into left nostril daily as needed (nose bleeds).    . traZODone (DESYREL) 100 MG tablet TAKE 1 TABLET BY MOUTH AT BEDTIME AS NEEDED FOR SLEEP 90 tablet 0  . benzonatate (TESSALON) 200 MG capsule Take 1 capsule (200 mg total) by mouth 3 (three) times daily as needed for cough. 15 capsule 0   No current facility-administered medications for this visit.   Facility-Administered Medications Ordered in Other Visits  Medication Dose Route Frequency Provider Last Rate Last Admin  . sodium chloride flush (NS) 0.9 % injection 10 mL  10  mL Intravenous Once Lloyd Huger, MD        OBJECTIVE: Vitals:   04/27/19 0839  BP: 126/68  Pulse: (!) 58  Resp: 18  Temp: (!) 96.8 F (36 C)  SpO2: 100%     Body mass index is 21.81 kg/m.    ECOG FS:0 - Asymptomatic  General: Well-developed, well-nourished, no acute distress. Eyes: Pink conjunctiva, anicteric sclera. HEENT: Normocephalic, moist mucous membranes. Lungs: No audible wheezing or coughing. Heart: Regular rate and rhythm. Abdomen: Soft, nontender, no obvious distention. Musculoskeletal: No edema, cyanosis, or clubbing. Neuro: Alert, answering all questions appropriately. Cranial nerves grossly intact. Skin: No rashes or petechiae noted. Psych: Normal affect.  LAB RESULTS:  Lab Results  Component Value Date   NA 142 04/27/2019   K 4.6 04/27/2019   CL 104 04/27/2019   CO2 28 04/27/2019   GLUCOSE 301 (H) 04/27/2019   BUN 41 (H) 04/27/2019   CREATININE 1.67 (H) 04/27/2019   CALCIUM 10.1 04/27/2019   PROT 6.7 04/27/2019   ALBUMIN 3.9 04/27/2019   AST 27 04/27/2019   ALT 17 04/27/2019   ALKPHOS 105 04/27/2019   BILITOT 0.7 04/27/2019   GFRNONAA 39 (L) 04/27/2019   GFRAA 45 (L) 04/27/2019     Lab Results  Component Value Date   WBC 3.7 (L) 04/27/2019   NEUTROABS 2.8 04/27/2019   HGB 10.4 (L) 04/27/2019   HCT 33.3 (L) 04/27/2019   MCV 97.7 04/27/2019   PLT 137 (L) 04/27/2019     STUDIES: No results found.  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  PLAN:    1. CLL: Repeat bone marrow biopsy on Jul 28, 2018 reviewed independently with 73% involvement of CLL.  Previous bone marrow biopsy on October 23, 2017 revealed only 31% involvement.  Previously, peripheral blood FISH testing 50% incidence of mutation in the ATM gene which is commonly associated with deletion 11q. 11q- is associated with an unfavorable prognosis and high risk of not responding to initial treatment. 13q- is a more common mutation and is actually associated with a more favorable prognosis. Patient had clear progression of disease in his bone marrow along with a worsening transfusion requirement.  He received 5 weekly cycles of Rituxan with mild improvement of his platelet count.  Patient then received 4 cycles of Rituxan plus Treanda completing treatment on January 12, 2019.  Patient's blood counts are significantly improved.  No intervention is needed at this time.  Return to clinic in 3 months with repeat laboratory work and further evaluation.  2.  Thrombocytopenia: Platelet count improved to 137 today.  Previously, he received high-dose prednisone, IVIG, and Rituxan with no appreciable durability to improve his platelet count.   3.  Anemia: Hemoglobin continues to slowly improve and is now 10.4.  Patient's last blood transfusion was given on December 03, 2018.  Patient had colonoscopy on November 17, 2017 that removed 2 nonmalignant polyps.  EGD on the same day revealed nonbleeding erosive gastropathy with multiple nonbleeding duodenal ulcers.  Will refer back to GI for further evaluation.   4.  Reaction to Rituxan: Rate-based.  Patient will require premedications for any further  infusions with Rituxan.  These have been entered into his treatment plan. 5.  Easy bruising: Resolved. 6.  Nosebleeds: Resolved.  Patient was previously given a referral back to ENT for further evaluation. 7.  Rash: Resolved. 8.  Leukopenia: Improved, mild. 9.  Chronic renal insufficiency: Patient's creatinine has trended up slightly to 1.67.  Continue to monitor. 10.  Hyperglycemia: Patient has poor blood glucose control.  Continue monitoring and treatment per primary care.   Patient expressed understanding and was in agreement with this plan. He also understands that He can call clinic at any time with any questions, concerns, or complaints.    Lloyd Huger, MD   04/28/2019 11:48 AM

## 2019-04-23 ENCOUNTER — Other Ambulatory Visit: Payer: Self-pay | Admitting: Family Medicine

## 2019-04-26 NOTE — Progress Notes (Signed)
Patient is coming in for follow up he is doing well no complaints  

## 2019-04-27 ENCOUNTER — Inpatient Hospital Stay: Payer: Medicare Other | Attending: Oncology

## 2019-04-27 ENCOUNTER — Other Ambulatory Visit: Payer: Self-pay

## 2019-04-27 ENCOUNTER — Inpatient Hospital Stay (HOSPITAL_BASED_OUTPATIENT_CLINIC_OR_DEPARTMENT_OTHER): Payer: Medicare Other | Admitting: Oncology

## 2019-04-27 ENCOUNTER — Inpatient Hospital Stay: Payer: Medicare Other

## 2019-04-27 VITALS — BP 126/68 | HR 58 | Temp 96.8°F | Resp 18 | Wt 152.0 lb

## 2019-04-27 DIAGNOSIS — C9110 Chronic lymphocytic leukemia of B-cell type not having achieved remission: Secondary | ICD-10-CM | POA: Insufficient documentation

## 2019-04-27 DIAGNOSIS — I129 Hypertensive chronic kidney disease with stage 1 through stage 4 chronic kidney disease, or unspecified chronic kidney disease: Secondary | ICD-10-CM | POA: Diagnosis not present

## 2019-04-27 DIAGNOSIS — Z95828 Presence of other vascular implants and grafts: Secondary | ICD-10-CM

## 2019-04-27 DIAGNOSIS — N183 Chronic kidney disease, stage 3 unspecified: Secondary | ICD-10-CM | POA: Diagnosis not present

## 2019-04-27 DIAGNOSIS — Z79899 Other long term (current) drug therapy: Secondary | ICD-10-CM | POA: Insufficient documentation

## 2019-04-27 DIAGNOSIS — Z8349 Family history of other endocrine, nutritional and metabolic diseases: Secondary | ICD-10-CM | POA: Diagnosis not present

## 2019-04-27 DIAGNOSIS — Z8249 Family history of ischemic heart disease and other diseases of the circulatory system: Secondary | ICD-10-CM | POA: Insufficient documentation

## 2019-04-27 DIAGNOSIS — Z794 Long term (current) use of insulin: Secondary | ICD-10-CM | POA: Diagnosis not present

## 2019-04-27 DIAGNOSIS — D649 Anemia, unspecified: Secondary | ICD-10-CM | POA: Insufficient documentation

## 2019-04-27 DIAGNOSIS — Z833 Family history of diabetes mellitus: Secondary | ICD-10-CM | POA: Diagnosis not present

## 2019-04-27 DIAGNOSIS — E1165 Type 2 diabetes mellitus with hyperglycemia: Secondary | ICD-10-CM | POA: Insufficient documentation

## 2019-04-27 DIAGNOSIS — D693 Immune thrombocytopenic purpura: Secondary | ICD-10-CM | POA: Diagnosis not present

## 2019-04-27 DIAGNOSIS — E785 Hyperlipidemia, unspecified: Secondary | ICD-10-CM | POA: Diagnosis not present

## 2019-04-27 DIAGNOSIS — E1122 Type 2 diabetes mellitus with diabetic chronic kidney disease: Secondary | ICD-10-CM | POA: Insufficient documentation

## 2019-04-27 DIAGNOSIS — Z87891 Personal history of nicotine dependence: Secondary | ICD-10-CM | POA: Diagnosis not present

## 2019-04-27 LAB — COMPREHENSIVE METABOLIC PANEL
ALT: 17 U/L (ref 0–44)
AST: 27 U/L (ref 15–41)
Albumin: 3.9 g/dL (ref 3.5–5.0)
Alkaline Phosphatase: 105 U/L (ref 38–126)
Anion gap: 10 (ref 5–15)
BUN: 41 mg/dL — ABNORMAL HIGH (ref 8–23)
CO2: 28 mmol/L (ref 22–32)
Calcium: 10.1 mg/dL (ref 8.9–10.3)
Chloride: 104 mmol/L (ref 98–111)
Creatinine, Ser: 1.67 mg/dL — ABNORMAL HIGH (ref 0.61–1.24)
GFR calc Af Amer: 45 mL/min — ABNORMAL LOW (ref >60)
GFR calc non Af Amer: 39 mL/min — ABNORMAL LOW (ref >60)
Glucose, Bld: 301 mg/dL — ABNORMAL HIGH (ref 70–99)
Potassium: 4.6 mmol/L (ref 3.5–5.1)
Sodium: 142 mmol/L (ref 135–145)
Total Bilirubin: 0.7 mg/dL (ref 0.3–1.2)
Total Protein: 6.7 g/dL (ref 6.5–8.1)

## 2019-04-27 LAB — CBC WITH DIFFERENTIAL/PLATELET
Abs Immature Granulocytes: 0.17 10*3/uL — ABNORMAL HIGH (ref 0.00–0.07)
Basophils Absolute: 0 10*3/uL (ref 0.0–0.1)
Basophils Relative: 1 %
Eosinophils Absolute: 0.1 10*3/uL (ref 0.0–0.5)
Eosinophils Relative: 2 %
HCT: 33.3 % — ABNORMAL LOW (ref 39.0–52.0)
Hemoglobin: 10.4 g/dL — ABNORMAL LOW (ref 13.0–17.0)
Immature Granulocytes: 5 %
Lymphocytes Relative: 7 %
Lymphs Abs: 0.3 10*3/uL — ABNORMAL LOW (ref 0.7–4.0)
MCH: 30.5 pg (ref 26.0–34.0)
MCHC: 31.2 g/dL (ref 30.0–36.0)
MCV: 97.7 fL (ref 80.0–100.0)
Monocytes Absolute: 0.4 10*3/uL (ref 0.1–1.0)
Monocytes Relative: 10 %
Neutro Abs: 2.8 10*3/uL (ref 1.7–7.7)
Neutrophils Relative %: 75 %
Platelets: 137 10*3/uL — ABNORMAL LOW (ref 150–400)
RBC: 3.41 MIL/uL — ABNORMAL LOW (ref 4.22–5.81)
RDW: 11.9 % (ref 11.5–15.5)
WBC: 3.7 10*3/uL — ABNORMAL LOW (ref 4.0–10.5)
nRBC: 0 % (ref 0.0–0.2)

## 2019-04-27 LAB — SAMPLE TO BLOOD BANK

## 2019-04-27 MED ORDER — HEPARIN SOD (PORK) LOCK FLUSH 100 UNIT/ML IV SOLN
500.0000 [IU] | Freq: Once | INTRAVENOUS | Status: AC
  Administered 2019-04-27: 09:00:00 500 [IU] via INTRAVENOUS
  Filled 2019-04-27: qty 5

## 2019-04-27 MED ORDER — SODIUM CHLORIDE 0.9% FLUSH
10.0000 mL | Freq: Once | INTRAVENOUS | Status: AC
  Administered 2019-04-27: 09:00:00 10 mL via INTRAVENOUS
  Filled 2019-04-27: qty 10

## 2019-04-28 ENCOUNTER — Ambulatory Visit: Payer: Medicare Other

## 2019-04-28 ENCOUNTER — Ambulatory Visit
Admission: RE | Admit: 2019-04-28 | Discharge: 2019-04-28 | Disposition: A | Discharge status: Home / Self Care | Payer: Medicare Other | Source: Ambulatory Visit | Attending: Family Medicine | Admitting: Family Medicine

## 2019-04-28 ENCOUNTER — Encounter: Payer: Self-pay | Admitting: Primary Care

## 2019-04-28 ENCOUNTER — Ambulatory Visit: Payer: Medicare Other | Admitting: Primary Care

## 2019-04-28 ENCOUNTER — Ambulatory Visit (INDEPENDENT_AMBULATORY_CARE_PROVIDER_SITE_OTHER): Payer: Medicare Other | Admitting: Primary Care

## 2019-04-28 ENCOUNTER — Ambulatory Visit (INDEPENDENT_AMBULATORY_CARE_PROVIDER_SITE_OTHER): Payer: Medicare Other | Admitting: Internal Medicine

## 2019-04-28 ENCOUNTER — Ambulatory Visit
Admission: RE | Admit: 2019-04-28 | Discharge: 2019-04-28 | Disposition: A | Discharge status: Home / Self Care | Payer: Medicare Other | Source: Ambulatory Visit | Attending: Internal Medicine | Admitting: Internal Medicine

## 2019-04-28 VITALS — BP 141/69 | HR 79 | Temp 98.3°F | Ht 70.0 in | Wt 152.0 lb

## 2019-04-28 DIAGNOSIS — R059 Cough, unspecified: Secondary | ICD-10-CM

## 2019-04-28 DIAGNOSIS — R05 Cough: Secondary | ICD-10-CM

## 2019-04-28 DIAGNOSIS — R051 Acute cough: Secondary | ICD-10-CM | POA: Insufficient documentation

## 2019-04-28 HISTORY — DX: Cough, unspecified: R05.9

## 2019-04-28 HISTORY — DX: Cough: R05

## 2019-04-28 MED ORDER — AZITHROMYCIN 250 MG PO TABS: ORAL_TABLET | ORAL | 0 refills | Status: AC

## 2019-04-28 MED ORDER — BENZONATATE 200 MG PO CAPS: 200.0000 mg | ORAL_CAPSULE | Freq: Three times a day (TID) | ORAL | 0 refills | Status: DC | PRN

## 2019-04-28 NOTE — Progress Notes (Signed)
Date:  04/28/2019   Name:  Scott [name]   DOB:  10/30/1940   MRN:  [id number]  [facility] note. Chief Complaint: Cough (Was tested Covid 6 month ago but not recently. Started with a sore throat- that is now better. He is now having a dry cough. Mints help at times. No fever, or body aches. )  Cough This is a new problem. The current episode started in the past 7 days. The problem has been gradually improving. The problem occurs every few hours. The cough is productive of sputum and productive of brown sputum. Pertinent negatives include no chest pain, chills, fever, headaches, nasal congestion, postnasal drip, shortness of breath or wheezing. He has tried prescription cough suppressant (tessalon perles) for the symptoms.    Lab Results  Component Value Date   CREATININE 1.67 (H) 04/27/2019   BUN 41 (H) 04/27/2019   NA 142 04/27/2019   K 4.6 04/27/2019   CL 104 04/27/2019   CO2 28 04/27/2019   Lab Results  Component Value Date   CHOL 148 02/22/2019   HDL 41.60 02/22/2019   LDLCALC 83 02/22/2019   LDLDIRECT 84 09/04/2011   TRIG 120.0 02/22/2019   CHOLHDL 4 02/22/2019   Lab Results  Component Value Date   TSH 2.39 02/22/2019   Lab Results  Component Value Date   HGBA1C 7.9 (H) 02/22/2019     Review of Systems  Constitutional: Negative for appetite change, chills and fever.  HENT: Negative for congestion, postnasal drip, sinus pressure and trouble swallowing.   Respiratory: Positive for cough and chest tightness. Negative for shortness of breath and wheezing.   Cardiovascular: Negative for chest pain and palpitations.  Neurological: Negative for dizziness, light-headedness and headaches.  Psychiatric/Behavioral: Negative for sleep disturbance.    Patient Active Problem List   Diagnosis Date Noted  . Cough 04/28/2019  . Left foot pain 11/17/2018  . Cellulitis of buttock, left 11/17/2018  . Cervical spondylosis 03/27/2018  . Carotid stenosis,  asymptomatic, bilateral 03/26/2018  . Hyperkalemia 02/28/2018  . Iron deficiency anemia due to chronic blood loss   . Sternal fracture 09/13/2017  . Tinea corporis 08/23/2017  . Pedal edema 08/23/2017  . Right knee pain 05/19/2017  . Glaucoma   . Generalized headaches   . Colon polyps   . Arthritis   . CLL (chronic lymphocytic leukemia) ([place]) 01/18/2016  . Chronic insomnia 01/03/2016  . Diabetic neuropathy ([place]) 06/09/2015  . BPV (benign positional vertigo) 05/23/2014  . Advanced care planning/counseling discussion 01/25/2014  . Medicare annual wellness visit, subsequent 07/06/2012  . Hyperlipidemia associated with type 2 diabetes mellitus ([place])   . CKD (chronic kidney disease) stage 3, GFR 30-59 ml/min ([facility])   . Uncontrolled diabetes mellitus with stage 2 chronic kidney disease, with long-term current use of insulin ([place])   . Anemia in CKD (chronic kidney disease) 10/11/2011  . Idiopathic thrombocytopenic purpura (ITP) ([facility]) 10/11/2011  . CAD (coronary artery disease) 10/10/2011  . Hypertension 10/10/2011  . Hypothyroidism 10/10/2011  . Bradycardia 10/10/2011  . Acquired hand deformity 03/04/1960    No Known Allergies  Past Surgical History:  Procedure Laterality Date  . BACK SURGERY     cervical neck  . CATARACT EXTRACTION, BILATERAL Bilateral 2017  . COLONOSCOPY  11/2008   1 polyp, diverticulosis, rec rpt 5 yrs (Dr. Oletta Lamas, [name])  . COLONOSCOPY  06/2014   hyperplastic polyp, rpt 5 yrs ([place])  . COLONOSCOPY WITH PROPOFOL N/A 11/17/2017   TA, HP, (  Lin Landsman, MD)  . ESOPHAGOGASTRODUODENOSCOPY (EGD) WITH PROPOFOL N/A 11/17/2017   healing erosive gastritis, intestinal metaplasia, neg H pylori (Vanga, Tally Due, MD)  . EYE SURGERY  2012   laser surgery for glaucoma  . PORTACATH PLACEMENT Left 12/09/2018   Procedure: INSERTION PORT-A-CATH;  Surgeon: [name], MD;  Location: [facility] ORS;  Service: General;  Laterality: Left;  . PTCA  1994, 1995  . US  ECHOCARDIOGRAPHY  10/2013   inferior wall hypokinesis, mild LVH, EF 50-55%, mild MR and LA dilation    Social History   Tobacco Use  . Smoking status: Former Smoker    Quit date: 03/04/1978    Years since quitting: 41.1  . Smokeless tobacco: Never Used  Substance Use Topics  . Alcohol use: No  . Drug use: No     Medication list has been reviewed and updated.  Current Meds  Medication Sig  . acetaminophen (TYLENOL) 650 MG CR tablet Take 1,300 mg by mouth 2 (two) times daily.   Marland Kitchen amLODipine (NORVASC) 5 MG tablet Take 1 tablet (5 mg total) by mouth daily.  Marland Kitchen atorvastatin (LIPITOR) 80 MG tablet Take 1 tablet by mouth once daily  . benzonatate (TESSALON) 200 MG capsule Take 1 capsule (200 mg total) by mouth 3 (three) times daily as needed for cough.  . carvedilol (COREG) 6.25 MG tablet Take 1 tablet (6.25 mg total) by mouth 2 (two) times daily with a meal.  . clotrimazole (LOTRIMIN) 1 % cream Apply 1 application topically 2 (two) times daily.  Marland Kitchen docusate sodium (COLACE) 100 MG capsule Take 100 mg by mouth daily as needed.   Marland Kitchen glucose blood (ONETOUCH ULTRA) test strip USE 1 STRIP TO CHECK GLUCOSE 3 TIMES DAILY  . insulin aspart (NOVOLOG FLEXPEN) 100 UNIT/ML FlexPen Take 5 units with meals, take 10 units if mealtime sugar >250 (Patient taking differently: Inject 5-10 Units into the skin See admin instructions. Take 5 units with meals, take 10 units if mealtime sugar >250)  . LEVEMIR FLEXTOUCH 100 UNIT/ML Pen INJECT 25 UNITS SUBCUTANEOUSLY ONCE DAILY AT  10PM  . levothyroxine (SYNTHROID) 88 MCG tablet Take 1 tablet by mouth once daily  . lidocaine-prilocaine (EMLA) cream Apply 1 application topically as needed. To port site prior to access  . nitroGLYCERIN (NITROSTAT) 0.4 MG SL tablet Place 1 tablet (0.4 mg total) under the tongue every 5 (five) minutes as needed for chest pain ((MAX of 3 doses)).  Marland Kitchen oxymetazoline (AFRIN) 0.05 % nasal spray Place 1 spray into left nostril daily as needed (nose  bleeds).  . traZODone (DESYREL) 100 MG tablet TAKE 1 TABLET BY MOUTH AT BEDTIME AS NEEDED FOR SLEEP    PHQ 2/9 Scores 02/22/2019 03/19/2018 02/20/2018 02/19/2017  PHQ - 2 Score 0 0 0 0  PHQ- 9 Score 0 - 0 0    BP Readings from Last 3 Encounters:  04/28/19 (!) 141/69  04/27/19 126/68  03/30/19 130/70    Physical Exam Constitutional:      General: He is not in acute distress.    Appearance: Normal appearance.  HENT:     Head: Normocephalic.     Right Ear: Tympanic membrane normal.     Left Ear: Tympanic membrane normal.     Nose: Nose normal.     Right Sinus: No maxillary sinus tenderness or frontal sinus tenderness.     Left Sinus: No maxillary sinus tenderness or frontal sinus tenderness.  Cardiovascular:     Rate and Rhythm: Normal rate  and regular rhythm.     Pulses: Normal pulses.     Heart sounds: No murmur.  Pulmonary:     Effort: Pulmonary effort is normal. No accessory muscle usage or respiratory distress.     Breath sounds: Examination of the right-upper field reveals wheezing. Examination of the left-upper field reveals wheezing. Wheezing present. No rhonchi.  Musculoskeletal:     Cervical back: Normal range of motion.  Lymphadenopathy:     Cervical: No cervical adenopathy.  Neurological:     Mental Status: He is alert.  Psychiatric:        Attention and Perception: Attention normal.        Mood and Affect: Mood normal.     Wt Readings from Last 3 Encounters:  04/28/19 152 lb (68.9 kg)  04/28/19 152 lb (68.9 kg)  04/27/19 152 lb (68.9 kg)    BP (!) 141/69   Pulse 79   Temp 98.3 F (36.8 C) (Oral)   Ht 5\' 10"  (1.778 m)   Wt 152 lb (68.9 kg)   SpO2 99%   BMI 21.81 kg/m   Assessment and Plan: 1. Cough in adult Suspect bacterial bronchitis so will treat with Zpak. Pt is comfortable on room air with normal O2 sats CXR official read is pending - by my reading NAD; port in place. He will continue Tessalon at least twice a day as these have helped  significantly He is advised to remain at home until Covid test returns. If he has further concerns, he will contact his PCP. - DG Chest 2 View; Future - Novel Coronavirus, NAA ([facility]) - azithromycin (ZITHROMAX Z-PAK) 250 MG tablet; UAD  Dispense: 6 each; Refill: 0   Partially dictated using Editor, commissioning. Any errors are unintentional.  Halina Maidens, MD [facility]  04/28/2019

## 2019-04-28 NOTE — Progress Notes (Signed)
Subjective:    Patient ID: Scott [name], male    DOB: [birth year], 79 y.o.   MRN: [id number]  HPI  Virtual Visit via Video Note  I connected with [name] on 04/28/19 at  9:40 AM EST by a video enabled telemedicine application and verified that I am speaking with the correct person using two identifiers.  Location: Patient: Home Provider: Office   I discussed the limitations of evaluation and management by telemedicine and the availability of in person appointments. The patient expressed understanding and agreed to proceed.  History of Present Illness:  Scott [name] is a 79 year old male patient of Dr. Danise Mina with a history of CAD, hypertension (not on ACE-I), uncontrolled type 2 diabetes, BPV, CKD, chronic insomnia, CLL, Tobacco abuse (quit in 1980) who presents today with a chief complaint of cough.  Symptoms began two weeks ago with a sore throat which has since dissipated. Since then he's experienced mostly dry cough. He notices coughing spells and cannot stop. He's been taking Mucinex DM without improvement to his cough. This morning his cough was "non stop".   He denies sore throat now, fevers, known exposure to Covid-19, loss of taste/smell, diarrhea, headaches. Overall his symptoms are about the same, no worse but no better. His girlfriend lives with him and has had the same symptoms for the last five day. His ex son in law and grandson come to visit him once weekly, they do not wear masks.   He underwent lab work yesterday including CBC and CMP, CBC with WBC count of 3.7.   Observations/Objective:  Alert and oriented. Appears well, not sickly. No distress. Speaking in complete sentences. Dry cough noted during exam.  Assessment and Plan:  Dry cough x 2 weeks, high risk Covid-19 exposure with his ex son in law and grandson visiting without wearing masks. Also girlfriend at home with same symptoms.  He actually appears pretty well via video. Given his history  of CLL coupled with presentation and cough I prefer that he be seen in person and undergo chest xray and Covid-19 testing.   Rx for Ladona Ridgel sent to pharmacy for now. Will have him scheduled at the respiratory clinic for this evening.   Follow Up Instructions:  We will call you to schedule you for the respiratory clinic this evening.  You may take Benzonatate capsules for cough, I sent this to your pharmacy. Take 1 capsule by mouth three times daily as needed for cough.  It was a pleasure meeting you! [name], NP-C    I discussed the assessment and treatment plan with the patient. The patient was provided an opportunity to ask questions and all were answered. The patient agreed with the plan and demonstrated an understanding of the instructions.   The patient was advised to call back or seek an in-person evaluation if the symptoms worsen or if the condition fails to improve as anticipated.    Pleas Koch, NP    Review of Systems  Constitutional: Negative for chills, fatigue and fever.  HENT: Positive for postnasal drip. Negative for congestion and sore throat.   Respiratory: Positive for cough. Negative for shortness of breath.   Cardiovascular: Negative for chest pain.  Gastrointestinal: Negative for diarrhea and nausea.  Neurological: Negative for headaches.       Past Medical History:  Diagnosis Date  . Acquired hand deformity 1962   hand saw accident at work  . Anemia 10/11/2011  . Arthritis   .  Bradycardia 10/10/2011  . CAD (coronary artery disease) 10/10/2011   MI s/p PTCA (Dx-OM2 proximal concentric stenosis)  . Carotid artery disease ([place])   . Chronic edema   . CKD (chronic kidney disease) stage 3, GFR 30-59 ml/min    [name]  . CLL (chronic lymphocytic leukemia) ([place])   . Colon polyps   . Generalized headaches    frequent  . GI bleed 07/08/2017  . Glaucoma    s/p laser surgery  . HLD (hyperlipidemia)   . Hypertension   . Hypothyroidism  10/10/2011  . Thrombocytopenia ([place]) 10/11/2011  . Type 2 diabetes with nephropathy [facility])    DM refresher course [facility] (04/2013)     Social History   Socioeconomic History  . Marital status: Widowed    Spouse name: Not on file  . Number of children: Not on file  . Years of education: Not on file  . Highest education level: Not on file  Occupational History  . Not on file  Tobacco Use  . Smoking status: Former Smoker    Quit date: 03/04/1978    Years since quitting: 41.1  . Smokeless tobacco: Never Used  Substance and Sexual Activity  . Alcohol use: No  . Drug use: No  . Sexual activity: Never  Other Topics Concern  . Not on file  Social History Narrative   Widower. Wife passed 08/2015 from cancer. 1 dog   Occupation: retired, was route Hotel manager   Edu: 11th gr   Activity: walks dog (pomeranian) 3-4x/day, yardwork, rides bicycle.   Diet: drinks diet coke, no vegetables   Social Determinants of Health   Financial Resource Strain:   . Difficulty of Paying Living Expenses: Not on file  Food Insecurity:   . Worried About Charity fundraiser in the Last Year: Not on file  . Ran Out of Food in the Last Year: Not on file  Transportation Needs:   . Lack of Transportation (Medical): Not on file  . Lack of Transportation (Non-Medical): Not on file  Physical Activity:   . Days of Exercise per Week: Not on file  . Minutes of Exercise per Session: Not on file  Stress:   . Feeling of Stress : Not on file  Social Connections:   . Frequency of Communication with Friends and Family: Not on file  . Frequency of Social Gatherings with Friends and Family: Not on file  . Attends Religious Services: Not on file  . Active Member of Clubs or Organizations: Not on file  . Attends Archivist Meetings: Not on file  . Marital Status: Not on file  Intimate Partner Violence:   . Fear of Current or Ex-Partner: Not on file  . Emotionally Abused: Not on file  . Physically Abused: Not on file   . Sexually Abused: Not on file    Past Surgical History:  Procedure Laterality Date  . BACK SURGERY     cervical neck  . CATARACT EXTRACTION, BILATERAL Bilateral 2017  . COLONOSCOPY  11/2008   1 polyp, diverticulosis, rec rpt 5 yrs (Dr. Oletta Lamas, [name])  . COLONOSCOPY  06/2014   hyperplastic polyp, rpt 5 yrs ([place])  . COLONOSCOPY WITH PROPOFOL N/A 11/17/2017   TA, HP, (Vanga, Tally Due, MD)  . ESOPHAGOGASTRODUODENOSCOPY (EGD) WITH PROPOFOL N/A 11/17/2017   healing erosive gastritis, intestinal metaplasia, neg H pylori (Vanga, Tally Due, MD)  . EYE SURGERY  2012   laser surgery for glaucoma  . PORTACATH PLACEMENT Left 12/09/2018   Procedure:  INSERTION PORT-A-CATH;  Surgeon: [name], MD;  Location: [facility] ORS;  Service: General;  Laterality: Left;  . PTCA  1994, 1995  . US ECHOCARDIOGRAPHY  10/2013   inferior wall hypokinesis, mild LVH, EF 50-55%, mild MR and LA dilation    Family History  Problem Relation Age of Onset  . Diabetes Sister   . Stomach cancer Sister 58  . Pneumonia Father        caused death  . Other Brother        no communication with brother so unsure of any health conditions  . Coronary artery disease Son 62       5v CABG and stents  . Hyperlipidemia Sister   . Stroke Neg Hx   . Heart attack Neg Hx     No Known Allergies  Current Outpatient Medications on File Prior to Visit  Medication Sig Dispense Refill  . acetaminophen (TYLENOL) 650 MG CR tablet Take 1,300 mg by mouth 2 (two) times daily.     Marland Kitchen amLODipine (NORVASC) 5 MG tablet Take 1 tablet (5 mg total) by mouth daily. 90 tablet 3  . atorvastatin (LIPITOR) 80 MG tablet Take 1 tablet by mouth once daily 90 tablet 3  . carvedilol (COREG) 6.25 MG tablet Take 1 tablet (6.25 mg total) by mouth 2 (two) times daily with a meal. 180 tablet 3  . clotrimazole (LOTRIMIN) 1 % cream Apply 1 application topically 2 (two) times daily. 80 g 0  . docusate sodium (COLACE) 100 MG capsule Take 100 mg by mouth  daily as needed.     Marland Kitchen glucose blood (ONETOUCH ULTRA) test strip USE 1 STRIP TO CHECK GLUCOSE 3 TIMES DAILY 300 each 1  . insulin aspart (NOVOLOG FLEXPEN) 100 UNIT/ML FlexPen Take 5 units with meals, take 10 units if mealtime sugar >250 (Patient taking differently: Inject 5-10 Units into the skin See admin instructions. Take 5 units with meals, take 10 units if mealtime sugar >250) 15 mL 11  . LEVEMIR FLEXTOUCH 100 UNIT/ML Pen INJECT 25 UNITS SUBCUTANEOUSLY ONCE DAILY AT  10PM 15 mL 2  . levothyroxine (SYNTHROID) 88 MCG tablet Take 1 tablet by mouth once daily 90 tablet 1  . lidocaine-prilocaine (EMLA) cream Apply 1 application topically as needed. To port site prior to access 30 g 2  . nitroGLYCERIN (NITROSTAT) 0.4 MG SL tablet Place 1 tablet (0.4 mg total) under the tongue every 5 (five) minutes as needed for chest pain ((MAX of 3 doses)). 25 tablet 0  . oxymetazoline (AFRIN) 0.05 % nasal spray Place 1 spray into left nostril daily as needed (nose bleeds).    . traZODone (DESYREL) 100 MG tablet TAKE 1 TABLET BY MOUTH AT BEDTIME AS NEEDED FOR SLEEP 90 tablet 0   Current Facility-Administered Medications on File Prior to Visit  Medication Dose Route Frequency Provider Last Rate Last Admin  . sodium chloride flush (NS) 0.9 % injection 10 mL  10 mL Intravenous Once Finnegan, Kathlene November, MD        Temp (!) 93.5 F (34.2 C) (Temporal)   Wt 152 lb (68.9 kg)   BMI 21.81 kg/m    Objective:   Physical Exam  Constitutional: He is oriented to person, place, and time. He appears well-nourished.  Respiratory: Effort normal.  Dry cough once during video visit, speaking in complete sentences.   Neurological: He is alert and oriented to person, place, and time.  Psychiatric: He has a normal mood and affect.  Assessment & Plan:

## 2019-04-28 NOTE — Patient Instructions (Signed)
Take tessalon perles at least twice a day.  Drink plenty of fluids.  We will call you about your Covid test and chest xray

## 2019-04-28 NOTE — Assessment & Plan Note (Signed)
Dry cough x 2 weeks, high risk Covid-19 exposure with his ex son in law and grandson visiting without wearing masks. Also girlfriend at home with same symptoms.  He actually appears pretty well via video. Given his history of CLL coupled with presentation and cough I prefer that he be seen in person and undergo chest xray and Covid-19 testing.   Rx for Ladona Ridgel sent to pharmacy for now. Will have him scheduled at the respiratory clinic for this evening.

## 2019-04-28 NOTE — Patient Instructions (Signed)
We will call you to schedule you for the respiratory clinic this evening.  You may take Benzonatate capsules for cough, I sent this to your pharmacy. Take 1 capsule by mouth three times daily as needed for cough.  It was a pleasure meeting you! [name], NP-C

## 2019-04-29 ENCOUNTER — Telehealth: Payer: Self-pay

## 2019-04-29 LAB — SPECIMEN STATUS REPORT

## 2019-04-29 LAB — NOVEL CORONAVIRUS, NAA: SARS-CoV-2, NAA: NOT DETECTED

## 2019-04-29 NOTE — Telephone Encounter (Signed)
Telephone call to patient to schedule palliative care visit with patient. Patient/family in agreement with home visit on 04-30-19 at 11:30AM.

## 2019-04-30 ENCOUNTER — Other Ambulatory Visit: Payer: Self-pay

## 2019-04-30 ENCOUNTER — Other Ambulatory Visit: Payer: Medicare Other

## 2019-04-30 DIAGNOSIS — Z515 Encounter for palliative care: Secondary | ICD-10-CM

## 2019-04-30 NOTE — Progress Notes (Addendum)
COMMUNITY PALLIATIVE CARE SW NOTE  PATIENT NAME: Scott [name] DOB: [birth year] MRN: [id number]  PRIMARY CARE PROVIDER: [name], MD  RESPONSIBLE PARTY:  Acct ID - Guarantor Home Phone Work Phone Relationship Acct Type  [id number] - Pizzuto,[name]  Self P/F     [place], [place],  [address]     PLAN OF CARE and INTERVENTIONS:             1. GOALS OF CARE/ ADVANCE CARE PLANNING:  Patient is a FULL CODE. HCPOA is [name], patient's son. Patient has Living Will complete. Goal is to continue to improve. 2. SOCIAL/EMOTIONAL/SPIRITUAL ASSESSMENT/ INTERVENTIONS:   SW met with patient and Scott [name] (patient's girlfriend) in the home. Patient reports he is "better". Patient has not felt good the past few days, had a cough develop the past few weeks. Patient said the cough was worse on Monday so he called PCP. PCP completed telehealth visit. Patient was sent for an x-ray, and patient said his report was clear. Patient also had COVID-19 test and it was negative. Patient reviewed his log of vitals. Patient has had about five instances where his blood sugar was higher than 300. SW and patient discussed the importance of monitoring his diet and taking medication as prescribed. Patient verbalized understanding, and Scott [name] is supportive. Patient is sleeping well. Patient appetite is good. SW discussed goals of care, used active and reflective listening, and provided supportive counseling.  3. PATIENT/CAREGIVER EDUCATION/ COPING:  Patient was alert, oriented. Patient was engaged in conversation. Patient is open with expressing his feelings. Patient's family and friends are supportive.  4. PERSONAL EMERGENCY PLAN:  Family will call 9-1-1 for emergencies. 5. COMMUNITY RESOURCES COORDINATION/ HEALTH CARE NAVIGATION:  Patient and [name] manage care. Patient is scheduled for labs on 3/12. Patient is scheduled for an appointment with GI on 3/30. 6. FINANCIAL/LEGAL CONCERNS/INTERVENTIONS:   None.     SOCIAL HX:  Social History   Tobacco Use  . Smoking status: Former Smoker    Quit date: 03/04/1978    Years since quitting: 41.1  . Smokeless tobacco: Never Used  Substance Use Topics  . Alcohol use: No    CODE STATUS:   Code Status: Prior (FULL) ADVANCED DIRECTIVES: Y MOST FORM COMPLETE:  No. HOSPICE EDUCATION PROVIDED: None.  PPS: Patient is independent of ADLs.  I spent7mnutes with patient/family, [id number] education, support and consultation.  WMargaretmary Lombard LCSW

## 2019-05-02 DIAGNOSIS — Z23 Encounter for immunization: Secondary | ICD-10-CM | POA: Diagnosis not present

## 2019-05-11 ENCOUNTER — Other Ambulatory Visit: Payer: Self-pay

## 2019-05-11 ENCOUNTER — Ambulatory Visit (INDEPENDENT_AMBULATORY_CARE_PROVIDER_SITE_OTHER): Payer: Medicare Other | Admitting: Family Medicine

## 2019-05-11 ENCOUNTER — Encounter: Payer: Self-pay | Admitting: Family Medicine

## 2019-05-11 VITALS — BP 120/68 | HR 62 | Temp 97.5°F | Ht 70.5 in | Wt 153.4 lb

## 2019-05-11 DIAGNOSIS — D492 Neoplasm of unspecified behavior of bone, soft tissue, and skin: Secondary | ICD-10-CM | POA: Diagnosis not present

## 2019-05-11 DIAGNOSIS — E039 Hypothyroidism, unspecified: Secondary | ICD-10-CM | POA: Diagnosis not present

## 2019-05-11 DIAGNOSIS — D0421 Carcinoma in situ of skin of right ear and external auricular canal: Secondary | ICD-10-CM | POA: Insufficient documentation

## 2019-05-11 LAB — T4, FREE: Free T4: 1.17 ng/dL (ref 0.60–1.60)

## 2019-05-11 LAB — TSH: TSH: 1.09 u[IU]/mL (ref 0.35–4.50)

## 2019-05-11 NOTE — Patient Instructions (Signed)
We will refer you to skin doctor for further evaluation of both spots on ears.

## 2019-05-11 NOTE — Assessment & Plan Note (Signed)
Update TFT with recent manufacturer change

## 2019-05-11 NOTE — Progress Notes (Signed)
This visit was conducted in person.  BP 120/68 (BP Location: Left Arm, Patient Position: Sitting, Cuff Size: Normal)   Pulse 62   Temp (!) 97.5 F (36.4 C) (Temporal)   Ht 5' 10.5" (1.791 m)   Wt 153 lb 6 oz (69.6 kg)   SpO2 100%   BMI 21.70 kg/m    CC: check skin lesion Subjective:    Patient ID: Scott [name], male    DOB: Jun 29, 1940, 79 y.o.   MRN: [id number]  HPI: Scott [name] is a 79 y.o. male presenting on 05/11/2019 for Skin Lesion (C/o growth behind right ear and on outer edge of left ear. Denies any pain/irritation.  Noticed 3-4 wks ago.  Area has increased in size.  Tried Neosporin and Vaseline. )   Noted growth behind both ears - present for 3-4 weeks. Seems to be growing. Treating with neosporin and clotrimazole without benefit. Also tried vaseline. Rough patch on rim of left ear, then growth behind R ear.   Seen last month virtually for cough, referred to [facility] - reassuring eval with negative covid test.  Treated with tessalon perls and azithromycin.  Known CLL followed by Dr [name].   Scott [name] tolerated his first covid shot last week!  Requests labs today (was planned for Friday).      Relevant past medical, surgical, family and social history reviewed and updated as indicated. Interim medical history since our last visit reviewed. Allergies and medications reviewed and updated. Outpatient Medications Prior to Visit  Medication Sig Dispense Refill  . acetaminophen (TYLENOL) 650 MG CR tablet Take 1,300 mg by mouth 2 (two) times daily.     Marland Kitchen amLODipine (NORVASC) 5 MG tablet Take 1 tablet (5 mg total) by mouth daily. 90 tablet 3  . atorvastatin (LIPITOR) 80 MG tablet Take 1 tablet by mouth once daily 90 tablet 3  . carvedilol (COREG) 6.25 MG tablet Take 1 tablet (6.25 mg total) by mouth 2 (two) times daily with a meal. 180 tablet 3  . docusate sodium (COLACE) 100 MG capsule Take 100 mg by mouth daily as needed.     Marland Kitchen glucose blood (ONETOUCH ULTRA) test strip USE 1  STRIP TO CHECK GLUCOSE 3 TIMES DAILY 300 each 1  . insulin aspart (NOVOLOG FLEXPEN) 100 UNIT/ML FlexPen Take 5 units with meals, take 10 units if mealtime sugar >250 (Patient taking differently: Inject 5-10 Units into the skin See admin instructions. Take 5 units with meals, take 10 units if mealtime sugar >250) 15 mL 11  . LEVEMIR FLEXTOUCH 100 UNIT/ML Pen INJECT 25 UNITS SUBCUTANEOUSLY ONCE DAILY AT  10PM 15 mL 2  . levothyroxine (SYNTHROID) 88 MCG tablet Take 1 tablet by mouth once daily 90 tablet 1  . nitroGLYCERIN (NITROSTAT) 0.4 MG SL tablet Place 1 tablet (0.4 mg total) under the tongue every 5 (five) minutes as needed for chest pain ((MAX of 3 doses)). 25 tablet 0  . oxymetazoline (AFRIN) 0.05 % nasal spray Place 1 spray into left nostril daily as needed (nose bleeds).    . traZODone (DESYREL) 100 MG tablet TAKE 1 TABLET BY MOUTH AT BEDTIME AS NEEDED FOR SLEEP 90 tablet 0  . benzonatate (TESSALON) 200 MG capsule Take 1 capsule (200 mg total) by mouth 3 (three) times daily as needed for cough. 15 capsule 0  . clotrimazole (LOTRIMIN) 1 % cream Apply 1 application topically 2 (two) times daily. 80 g 0  . lidocaine-prilocaine (EMLA) cream Apply 1 application topically as needed. To  port site prior to access 30 g 2   Facility-Administered Medications Prior to Visit  Medication Dose Route Frequency Provider Last Rate Last Admin  . sodium chloride flush (NS) 0.9 % injection 10 mL  10 mL Intravenous Once [name], Kathlene November, MD         Per HPI unless specifically indicated in ROS section below Review of Systems Objective:    BP 120/68 (BP Location: Left Arm, Patient Position: Sitting, Cuff Size: Normal)   Pulse 62   Temp (!) 97.5 F (36.4 C) (Temporal)   Ht 5' 10.5" (1.791 m)   Wt 153 lb 6 oz (69.6 kg)   SpO2 100%   BMI 21.70 kg/m   Wt Readings from Last 3 Encounters:  05/11/19 153 lb 6 oz (69.6 kg)  04/28/19 152 lb (68.9 kg)  04/28/19 152 lb (68.9 kg)    Physical Exam Vitals and  nursing note reviewed.  Constitutional:      Appearance: Normal appearance. Scott [name] is not ill-appearing.  Skin:    General: Skin is warm and dry.     Findings: Lesion present.     Comments:  Hyperkeratotic growth R posterior ear Rough patch of skin superior L ear helix  Neurological:     Mental Status: Scott [name] is alert.            Results for orders placed or performed in visit on 04/28/19  Novel Coronavirus, NAA ([facility])   Specimen: Oropharyngeal(OP) collection in vial transport medium   OROPHARYNGEA  IS THIS  Result Value Ref Range   SARS-CoV-2, NAA Not Detected Not Detected  Specimen status report  Result Value Ref Range   specimen status report Comment    Assessment & Plan:  This visit occurred during the SARS-CoV-2 public health emergency.  Safety protocols were in place, including screening questions prior to the visit, additional usage of staff PPE, and extensive cleaning of exam room while observing appropriate contact time as indicated for disinfecting solutions.   Problem List Items Addressed This Visit    Hypothyroidism    Update TFT with recent manufacturer change      Relevant Orders   TSH   T4, free   Abnormal skin growth - Primary    ?SK on right Will refer to derm for further evaluation.       Relevant Orders   Ambulatory referral to Dermatology       No orders of the defined types were placed in this encounter.  Orders Placed This Encounter  Procedures  . TSH  . T4, free  . Ambulatory referral to Dermatology    Referral Priority:   Routine    Referral Type:   Consultation    Referral Reason:   Specialty Services Required    Requested Specialty:   Dermatology    Number of Visits Requested:   1    Patient Instructions  We will refer you to skin doctor for further evaluation of both spots on ears.    Follow up plan: Return if symptoms worsen or fail to improve.  [name], MD

## 2019-05-11 NOTE — Assessment & Plan Note (Addendum)
?  SK on right Will refer to derm for further evaluation.

## 2019-05-13 ENCOUNTER — Telehealth: Payer: Self-pay

## 2019-05-13 NOTE — Telephone Encounter (Signed)
Telephone call to schedule palliative care visit.  Patient in agreement with RN makin ghome visit 05/14/19 at 1:00 PM.

## 2019-05-14 ENCOUNTER — Other Ambulatory Visit: Payer: Medicare Other

## 2019-05-14 ENCOUNTER — Other Ambulatory Visit: Payer: Self-pay

## 2019-05-14 DIAGNOSIS — Z515 Encounter for palliative care: Secondary | ICD-10-CM

## 2019-05-14 NOTE — Progress Notes (Signed)
PATIENT NAME: Scott [name] DOB: November 17, 1940 MRN: [id number]  PRIMARY CARE PROVIDER: [name], MD  RESPONSIBLE PARTY:  Acct ID - Guarantor Home Phone Work Phone Relationship Acct Type  [id number] - Scott [name]  Self P/F     Scott [name], [place], [place] [address]    PLAN OF CARE and INTERVENTIONS:               1.  GOALS OF CARE/ ADVANCE CARE PLANNING:  Remain at home and be comfortable.               2.  PATIENT/CAREGIVER EDUCATION: Education on fall precautions, education on s/s of infection, reviewed meds, support                3.  DISEASE STATUS: RN made scheduled palliative care home visit. Patient walked outside and met nurse at end of ramp. Patient alert and oriented and denies having pain at the present time. Patient reports he had his check up with Dr Scott [name] office this week.  Patient reports he has not received any treatment for cancer for 3  months. Patient reports his hemoglobin was up to 10.4 and his platelets were 137. Patient reports he feels good and is able to get out of the house and enjoy spending time with his dogs. Patient talkative and reports he received his first Covid-48. Patient states he is scheduled to receive second Covid vaccine the end of March. Patient denies suffering any recent falls. Patient reports his appetite is good. Patients current weighed 153 pounds. Patient has a lesion come up behind right ear that is likely a skin cancer. Patient reports MD is making him a referral to see dermatologist. Nurse informed patient to make sure he follows up with Dermatology.Patient's blood sugar this morning was 69. Patient reports he ate some crackers and drink some orange juice and blood sugar came up to 98. Patients blood sugars run high and low.  Patient reports he has not been taking all of 25 units of insulin at night time due to his sugar dropping early in the AM. Patient reports he took 15 units of insulin last PM.  Patients son Scott [name] remains  attentive to patients needs. Patients friend Scott [name] also spends time with patient and patient looks forward to Scott [name] visits. Patient has no edema in his lower extremities. Patient reports he has been sleeping well and slept until 9 AM this morning. Patient's vital signs are stable. Patient remains in agreement with palliative care services. Patient encouraged to contact palliative care with questions or concerns.      HISTORY OF PRESENT ILLNESS:  Patient is a 79 year old make who is followed by palliative care.  Patient is seen monthly and PRN by palliative care.   CODE STATUS: Full Code  ADVANCED DIRECTIVES: Y MOST FORM: No PPS: 60%   PHYSICAL EXAM:   VITALS: Today's Vitals   05/14/19 1325  BP: 126/72  Pulse: 64  Resp: 16  SpO2: 99%  Weight: 153 lb (69.4 kg)  PainSc: 0-No pain    LUNGS: clear to auscultation  CARDIAC: Cor RRR  EXTREMITIES: none edema SKIN: Skin color, texture, turgor normal. No rashes or lesions  NEURO: Alert and oriented x 4, no falls, tires easily.       Scott Simmer, RN

## 2019-05-23 DIAGNOSIS — Z23 Encounter for immunization: Secondary | ICD-10-CM | POA: Diagnosis not present

## 2019-05-31 ENCOUNTER — Telehealth: Payer: Self-pay | Admitting: *Deleted

## 2019-05-31 NOTE — Telephone Encounter (Signed)
Patient called and is asking why he needs a colonoscopy when he had one 18 months ago. He is scheduled for tomorrow and would like a return call before proceeding with this

## 2019-05-31 NOTE — Telephone Encounter (Signed)
Informed patient of doctor response and that if he chooses to not have it done, he would need to call Dr Jamelle Haring office. He said he is scheduled for tomorrow at 3

## 2019-05-31 NOTE — Telephone Encounter (Signed)
I'm pretty sure he had a recent history of heme+ stools.

## 2019-06-01 ENCOUNTER — Other Ambulatory Visit: Payer: Self-pay

## 2019-06-01 ENCOUNTER — Ambulatory Visit (INDEPENDENT_AMBULATORY_CARE_PROVIDER_SITE_OTHER): Payer: Medicare Other | Admitting: Gastroenterology

## 2019-06-01 ENCOUNTER — Encounter: Payer: Self-pay | Admitting: Gastroenterology

## 2019-06-01 VITALS — BP 134/76 | HR 58 | Temp 97.6°F | Ht 70.5 in | Wt 153.6 lb

## 2019-06-01 DIAGNOSIS — K279 Peptic ulcer, site unspecified, unspecified as acute or chronic, without hemorrhage or perforation: Secondary | ICD-10-CM

## 2019-06-01 DIAGNOSIS — K3189 Other diseases of stomach and duodenum: Secondary | ICD-10-CM | POA: Diagnosis not present

## 2019-06-01 DIAGNOSIS — K2970 Gastritis, unspecified, without bleeding: Secondary | ICD-10-CM

## 2019-06-01 NOTE — Progress Notes (Signed)
Scott Darby, MD [address]  [place], [place] [address]  Main: [phone]  Fax: [phone]    Gastroenterology Consultation  Referring Provider:     Lloyd Huger, MD Primary Care Physician:  Scott Bush, MD Primary Gastroenterologist:  Dr. Cephas [name] Reason for Consultation:     Iron deficiency anemia        HPI:   Scott [name] is a 79 y.o. male referred by Dr. [name], MD  for consultation & management of and deficiency anemia. Patient was admitted to [facility] in 07/2017 with symptomatic anemia, hemoglobin 5.5, platelets of 8 with known history of CLL. His pancytopenia was thought to be secondary to CLL. Endoscopic evaluation was not performed during that admission because of severe Thrombocytopenia in the setting of CLL.patient received prednisone and IVIG as inpatient. Patient is closely followed by Dr. [name] at [place] and started on rituximab, received 2 infusions. His platelet count is 55 today, hemoglobin 10.4. He also has severe iron deficiency as well as borderline low B12 levels. He received parenteral iron therapy as well as oral B12. His ferritin levels improved as well as B12 levels. Patient is here to discuss about endoscopy evaluation. He denies abdominal pain, nausea, vomiting, rectal bleeding, melena, hematemesis, hematochezia. He stopped oral iron and B12  Follow-up visit 12/05/2017: Patient finished treatment for his CLL with rituximab.  His thrombocytopenia improved.  He underwent EGD and colonoscopy for iron deficiency anemia.  Found to have gastric erosions and duodenal ulcers.  There was no evidence of H. pylori.  Colonoscopy did not reveal evidence of iron deficiency anemia, terminal ileum normal.  Patient denies any bleeding episodes such as hematemesis, hematochezia, epistaxis, rectal bleeding.  He is being followed by Dr. [name] for his CLL.  He denies any GI symptoms.  He is not  on any acid suppression medication.  Follow-up visit 06/01/2019 Patient reports doing well from CLL standpoint.  He last chemo was 3 months ago.  He is here for follow-up of dysphagia and gastric intestinal metaplasia.  He reports that whenever he tries to eat any dry food, he feels stuck in his throat which goes down smoothly with water.  His last upper endoscopy did not reveal any structural lesions in the esophagus.  He thought he needed a colonoscopy per Dr. [name].  Patient is currently not on any PPI.  He denies any other GI symptoms.  He is no longer iron deficient.  His B12 levels are normal.  NSAIDs:  none  Antiplts/Anticoagulants/Anti thrombotics: none  GI Procedures: underwent colonoscopy in 2010, subcentimeter polyps were removed Underwent colonoscopy in 2016 in [place], 10 mm polyp from ascending colon was removed  EGD and colonoscopy 11/2017 - Multiple non-bleeding duodenal ulcers with a clean ulcer base ([place] Class III). - Non-bleeding erosive gastropathy. - Non-bleeding erosive gastropathy. Biopsied. - Small hiatal hernia. - Normal gastroesophageal junction and esophagus.  - Two 5 to 7 mm polyps in the transverse colon and in the cecum, removed with a hot snare. Resected and retrieved. - Diverticulosis in the sigmoid colon, in the transverse colon and in the ascending colon. - Increased mucosa vascular pattern in the rectum, prominent rectal veins - Normal mucosa in the entire examined colon.  DIAGNOSIS:  A. STOMACH, RANDOM; COLD BIOPSY:  - ANTRAL MUCOSA WITH REACTIVE AND HEALING EROSIVE GASTRITIS; FOCAL  INTESTINAL METAPLASIA IS PRESENT.  - OXYNTIC MUCOSA WITH MODERATE CHRONIC GASTRITIS AND CHANGES CONSISTENT  WITH  PROTON PUMP INHIBITOR EFFECT.  - IHC FOR HELICOBACTER IS NEGATIVE.  - NEGATIVE FOR DYSPLASIA AND MALIGNANCY.   B. COLON POLYP, CECUM; HOT SNARE:  - TUBULAR ADENOMA.  - NEGATIVE FOR HIGH-GRADE DYSPLASIA AND MALIGNANCY.   C. COLON POLYP,  TRANSVERSE; HOT SNARE:  - HYPERPLASTIC POLYP.  - NEGATIVE FOR DYSPLASIA AND MALIGNANCY.   Past Medical History:  Diagnosis Date  . Acquired hand deformity 1962   hand saw accident at work  . Anemia 10/11/2011  . Arthritis   . Bradycardia 10/10/2011  . CAD (coronary artery disease) 10/10/2011   MI s/p PTCA (Dx-OM2 proximal concentric stenosis)  . Carotid artery disease ([place])   . Cellulitis of buttock, left 11/17/2018  . Chronic edema   . CKD (chronic kidney disease) stage 3, GFR 30-59 ml/min    [name]  . CLL (chronic lymphocytic leukemia) ([place])   . Colon polyps   . Cough 04/28/2019  . Generalized headaches    frequent  . GI bleed 07/08/2017  . Glaucoma    s/p laser surgery  . HLD (hyperlipidemia)   . Hyperkalemia 02/28/2018  . Hypertension   . Hypothyroidism 10/10/2011  . Iron deficiency anemia due to chronic blood loss   . Thrombocytopenia ([place]) 10/11/2011  . Type 2 diabetes with nephropathy [facility])    DM refresher course [facility] (04/2013)    Past Surgical History:  Procedure Laterality Date  . BACK SURGERY     cervical neck  . CATARACT EXTRACTION, BILATERAL Bilateral 2017  . COLONOSCOPY  11/2008   1 polyp, diverticulosis, rec rpt 5 yrs (Dr. Oletta Lamas, [name])  . COLONOSCOPY  06/2014   hyperplastic polyp, rpt 5 yrs ([place])  . COLONOSCOPY WITH PROPOFOL N/A 11/17/2017   TA, HP, (Johni Narine, Tally Due, MD)  . ESOPHAGOGASTRODUODENOSCOPY (EGD) WITH PROPOFOL N/A 11/17/2017   healing erosive gastritis, intestinal metaplasia, neg H pylori (Ida Uppal, Tally Due, MD)  . EYE SURGERY  2012   laser surgery for glaucoma  . PORTACATH PLACEMENT Left 12/09/2018   Procedure: INSERTION PORT-A-CATH;  Surgeon: [name], MD;  Location: [facility] ORS;  Service: General;  Laterality: Left;  . PTCA  1994, 1995  . US ECHOCARDIOGRAPHY  10/2013   inferior wall hypokinesis, mild LVH, EF 50-55%, mild MR and LA dilation    Current Outpatient Medications:  .  acetaminophen (TYLENOL) 650 MG CR tablet, Take 1,300 mg  by mouth 2 (two) times daily. , Disp: , Rfl:  .  amLODipine (NORVASC) 5 MG tablet, Take 1 tablet (5 mg total) by mouth daily., Disp: 90 tablet, Rfl: 3 .  atorvastatin (LIPITOR) 80 MG tablet, Take 1 tablet by mouth once daily, Disp: 90 tablet, Rfl: 3 .  carvedilol (COREG) 6.25 MG tablet, Take 1 tablet (6.25 mg total) by mouth 2 (two) times daily with a meal., Disp: 180 tablet, Rfl: 3 .  docusate sodium (COLACE) 100 MG capsule, Take 100 mg by mouth daily as needed. , Disp: , Rfl:  .  glucose blood (ONETOUCH ULTRA) test strip, USE 1 STRIP TO CHECK GLUCOSE 3 TIMES DAILY, Disp: 300 each, Rfl: 1 .  insulin aspart (NOVOLOG FLEXPEN) 100 UNIT/ML FlexPen, Take 5 units with meals, take 10 units if mealtime sugar >250 (Patient taking differently: Inject 5-10 Units into the skin See admin instructions. Take 5 units with meals, take 10 units if mealtime sugar >250), Disp: 15 mL, Rfl: 11 .  LEVEMIR FLEXTOUCH 100 UNIT/ML Pen, INJECT 25 UNITS SUBCUTANEOUSLY ONCE DAILY AT  10PM, Disp: 15 mL, Rfl: 2 .  levothyroxine (SYNTHROID) 88 MCG tablet, Take 1 tablet by mouth once daily, Disp: 90 tablet, Rfl: 1 .  oxymetazoline (AFRIN) 0.05 % nasal spray, Place 1 spray into left nostril daily as needed (nose bleeds)., Disp: , Rfl:  .  traZODone (DESYREL) 100 MG tablet, TAKE 1 TABLET BY MOUTH AT BEDTIME AS NEEDED FOR SLEEP, Disp: 90 tablet, Rfl: 0 .  nitroGLYCERIN (NITROSTAT) 0.4 MG SL tablet, Place 1 tablet (0.4 mg total) under the tongue every 5 (five) minutes as needed for chest pain ((MAX of 3 doses)). (Patient not taking: Reported on 06/01/2019), Disp: 25 tablet, Rfl: 0 No current facility-administered medications for this visit.  Facility-Administered Medications Ordered in Other Visits:  .  sodium chloride flush (NS) 0.9 % injection 10 mL, 10 mL, Intravenous, Once, Finnegan, Kathlene November, MD    Family History  Problem Relation Age of Onset  . Diabetes Sister   . Stomach cancer Sister 10  . Pneumonia Father        caused  death  . Other Brother        no communication with brother so unsure of any health conditions  . Coronary artery disease Son 66       5v CABG and stents  . Hyperlipidemia Sister   . Stroke Neg Hx   . Heart attack Neg Hx      Social History   Tobacco Use  . Smoking status: Former Smoker    Quit date: 03/04/1978    Years since quitting: 41.2  . Smokeless tobacco: Never Used  Substance Use Topics  . Alcohol use: No  . Drug use: No    Allergies as of 06/01/2019  . (No Known Allergies)    Review of Systems:    All systems reviewed and negative except where noted in HPI.   Physical Exam:  BP 134/76   Pulse (!) 58   Temp 97.6 F (36.4 C) (Oral)   Ht 5' 10.5" (1.791 m)   Wt 153 lb 9.6 oz (69.7 kg)   BMI 21.73 kg/m  No LMP for male patient.  General:   Alert,  Well-developed, well-nourished, pleasant and cooperative in NAD Head:  Normocephalic and atraumatic. Eyes:  Sclera clear, no icterus.   Conjunctiva pink. Ears:  Normal auditory acuity, lesion behind the right auricle. Nose:  No deformity, discharge, or lesions. Mouth:  No deformity or lesions,oropharynx pink & moist. Neck:  Supple; no masses or thyromegaly. Lungs:  Respirations even and unlabored.  Clear throughout to auscultation.   No wheezes, crackles, or rhonchi. No acute distress. Heart:  Regular rate and rhythm; no murmurs, clicks, rubs, or gallops. Abdomen:  Normal bowel sounds. Soft, non-tender and non-distended without masses, hepatosplenomegaly or hernias noted.  No guarding or rebound tenderness.   Rectal: Not performed Msk:  Symmetrical without gross deformities. Good, equal movement & strength bilaterally. Pulses:  Normal pulses noted. Extremities:  No clubbing or edema.  No cyanosis. Neurologic:  Alert and oriented x3;  grossly normal neurologically. Skin: Dry scaly skin with maculopapular rash, petechiae no jaundice. Lymph Nodes:  No significant cervical adenopathy. Psych:  Alert and cooperative.  Normal mood and affect.  Imaging Studies: No abdominal imaging  Assessment and Plan:   AUDRA MCSWEENEY is a 79 y.o. Caucasian male with CLL with anemia and Thrombocytopenia, s/p rituximab, history of iron deficiency anemia, history of focal intestinal metaplasia of the stomach, duodenal ulcers, tubular adenoma of colon is seen for follow-up.   Patient is no longer iron deficient.  He does have mild normocytic anemia secondary to underlying CLL  Focal intestinal metaplasia in stomach: Recommend no EGD with gastric mapping Check H. pylori IgG based on the results of gastric mapping  Dysphagia EGD with biopsies, if unremarkable, will refer to esophageal manometry to evaluate for dysmotility disorder  Personal history of colon adenoma in 2019: Repeat colonoscopy in 2024  Follow up based on endoscopy results   Scott Darby, MD

## 2019-06-10 ENCOUNTER — Other Ambulatory Visit: Payer: Self-pay | Admitting: Family Medicine

## 2019-06-11 ENCOUNTER — Emergency Department
Admission: EM | Admit: 2019-06-11 | Discharge: 2019-06-11 | Disposition: A | Discharge status: Home / Self Care | Payer: Medicare Other | Attending: Emergency Medicine | Admitting: Emergency Medicine

## 2019-06-11 ENCOUNTER — Encounter: Payer: Self-pay | Admitting: Emergency Medicine

## 2019-06-11 ENCOUNTER — Emergency Department: Payer: Medicare Other

## 2019-06-11 ENCOUNTER — Other Ambulatory Visit: Payer: Self-pay

## 2019-06-11 DIAGNOSIS — R519 Headache, unspecified: Secondary | ICD-10-CM | POA: Diagnosis not present

## 2019-06-11 DIAGNOSIS — S0993XA Unspecified injury of face, initial encounter: Secondary | ICD-10-CM | POA: Diagnosis present

## 2019-06-11 DIAGNOSIS — Z79891 Long term (current) use of opiate analgesic: Secondary | ICD-10-CM | POA: Diagnosis not present

## 2019-06-11 DIAGNOSIS — M542 Cervicalgia: Secondary | ICD-10-CM | POA: Diagnosis not present

## 2019-06-11 DIAGNOSIS — W19XXXA Unspecified fall, initial encounter: Secondary | ICD-10-CM

## 2019-06-11 DIAGNOSIS — S0990XA Unspecified injury of head, initial encounter: Secondary | ICD-10-CM | POA: Diagnosis not present

## 2019-06-11 DIAGNOSIS — Z794 Long term (current) use of insulin: Secondary | ICD-10-CM | POA: Insufficient documentation

## 2019-06-11 DIAGNOSIS — I10 Essential (primary) hypertension: Secondary | ICD-10-CM | POA: Diagnosis not present

## 2019-06-11 DIAGNOSIS — Y9289 Other specified places as the place of occurrence of the external cause: Secondary | ICD-10-CM | POA: Insufficient documentation

## 2019-06-11 DIAGNOSIS — Y999 Unspecified external cause status: Secondary | ICD-10-CM | POA: Diagnosis not present

## 2019-06-11 DIAGNOSIS — Y9389 Activity, other specified: Secondary | ICD-10-CM | POA: Diagnosis not present

## 2019-06-11 DIAGNOSIS — S0083XA Contusion of other part of head, initial encounter: Secondary | ICD-10-CM | POA: Diagnosis not present

## 2019-06-11 DIAGNOSIS — I129 Hypertensive chronic kidney disease with stage 1 through stage 4 chronic kidney disease, or unspecified chronic kidney disease: Secondary | ICD-10-CM | POA: Diagnosis not present

## 2019-06-11 DIAGNOSIS — I2510 Atherosclerotic heart disease of native coronary artery without angina pectoris: Secondary | ICD-10-CM | POA: Insufficient documentation

## 2019-06-11 DIAGNOSIS — W01198A Fall on same level from slipping, tripping and stumbling with subsequent striking against other object, initial encounter: Secondary | ICD-10-CM | POA: Insufficient documentation

## 2019-06-11 DIAGNOSIS — Z79899 Other long term (current) drug therapy: Secondary | ICD-10-CM | POA: Insufficient documentation

## 2019-06-11 DIAGNOSIS — E039 Hypothyroidism, unspecified: Secondary | ICD-10-CM | POA: Insufficient documentation

## 2019-06-11 DIAGNOSIS — S199XXA Unspecified injury of neck, initial encounter: Secondary | ICD-10-CM | POA: Diagnosis not present

## 2019-06-11 DIAGNOSIS — E1122 Type 2 diabetes mellitus with diabetic chronic kidney disease: Secondary | ICD-10-CM | POA: Diagnosis not present

## 2019-06-11 DIAGNOSIS — N183 Chronic kidney disease, stage 3 unspecified: Secondary | ICD-10-CM | POA: Diagnosis not present

## 2019-06-11 LAB — CBC WITH DIFFERENTIAL/PLATELET
Abs Immature Granulocytes: 0 10*3/uL (ref 0.00–0.07)
Basophils Absolute: 0 10*3/uL (ref 0.0–0.1)
Basophils Relative: 1 %
Eosinophils Absolute: 0.1 10*3/uL (ref 0.0–0.5)
Eosinophils Relative: 4 %
HCT: 31.9 % — ABNORMAL LOW (ref 39.0–52.0)
Hemoglobin: 10.4 g/dL — ABNORMAL LOW (ref 13.0–17.0)
Immature Granulocytes: 0 %
Lymphocytes Relative: 11 %
Lymphs Abs: 0.3 10*3/uL — ABNORMAL LOW (ref 0.7–4.0)
MCH: 30.1 pg (ref 26.0–34.0)
MCHC: 32.6 g/dL (ref 30.0–36.0)
MCV: 92.5 fL (ref 80.0–100.0)
Monocytes Absolute: 0.3 10*3/uL (ref 0.1–1.0)
Monocytes Relative: 9 %
Neutro Abs: 2.3 10*3/uL (ref 1.7–7.7)
Neutrophils Relative %: 75 %
Platelets: 136 10*3/uL — ABNORMAL LOW (ref 150–400)
RBC: 3.45 MIL/uL — ABNORMAL LOW (ref 4.22–5.81)
RDW: 12.6 % (ref 11.5–15.5)
WBC: 3 10*3/uL — ABNORMAL LOW (ref 4.0–10.5)
nRBC: 0 % (ref 0.0–0.2)

## 2019-06-11 LAB — BASIC METABOLIC PANEL
Anion gap: 7 (ref 5–15)
BUN: 53 mg/dL — ABNORMAL HIGH (ref 8–23)
CO2: 27 mmol/L (ref 22–32)
Calcium: 9.9 mg/dL (ref 8.9–10.3)
Chloride: 105 mmol/L (ref 98–111)
Creatinine, Ser: 1.59 mg/dL — ABNORMAL HIGH (ref 0.61–1.24)
GFR calc Af Amer: 47 mL/min — ABNORMAL LOW (ref >60)
GFR calc non Af Amer: 41 mL/min — ABNORMAL LOW (ref >60)
Glucose, Bld: 191 mg/dL — ABNORMAL HIGH (ref 70–99)
Potassium: 4.4 mmol/L (ref 3.5–5.1)
Sodium: 139 mmol/L (ref 135–145)

## 2019-06-11 NOTE — ED Notes (Signed)
Pt states having a fall the other day and that his family wanted him to get checked out since he has leukemia. Pt states having a mild headache and has bruising around his right eye. Pt also has a few scabs on his hands.

## 2019-06-11 NOTE — ED Notes (Signed)
Purple and green top tubes sent to lab.

## 2019-06-11 NOTE — ED Triage Notes (Signed)
Pt to ED via POV c/o fall. Pt states that he remembers falling. Pt states that he tripped over a bush and hit his head on the sidewalk. Pt states that this happened last Saturday. Pt states that he is not dizzy but feels off balance at times when he takes a step. This has happened in the past when his platelets were low. Pt is in NAD at this time.

## 2019-06-11 NOTE — ED Provider Notes (Signed)
[facility] Emergency Department Provider Note   ____________________________________________   First MD Initiated Contact with Patient 06/11/19 1146     (approximate)  I have reviewed the triage vital signs and the nursing notes.   HISTORY  Chief Complaint Fall    HPI Scott [name] is a 79 y.o. male history of coronary disease,  chronic lymphocytic leukemia, diabetes  Patient reports Saturday he was helping of someone move, as he was doing so he tripped on a curb.  He fell struck his face over the right side.  Been sore just to the right of his eye since that time.  No fevers or chills.  No numbness weakness or tingling.  He reports he will have little bit of dizziness from time to time which is not a new issue for him, he is noticed that that has been ongoing issue for several weeks.  He is here because he got a bruise around his eye that is been present for a few days because of his low platelet counts his family recommended he needs to come make sure nothing got broken when he felt  No chest pain no trouble breathing.  No shortness of breath.  No fevers or chills.  No other illness no abdominal pain.  Past Medical History:  Diagnosis Date  . Acquired hand deformity 1962   hand saw accident at work  . Anemia 10/11/2011  . Arthritis   . Bradycardia 10/10/2011  . CAD (coronary artery disease) 10/10/2011   MI s/p PTCA (Dx-OM2 proximal concentric stenosis)  . Carotid artery disease ([place])   . Cellulitis of buttock, left 11/17/2018  . Chronic edema   . CKD (chronic kidney disease) stage 3, GFR 30-59 ml/min    [name]  . CLL (chronic lymphocytic leukemia) ([name])   . Colon polyps   . Cough 04/28/2019  . Generalized headaches    frequent  . GI bleed 07/08/2017  . Glaucoma    s/p laser surgery  . HLD (hyperlipidemia)   . Hyperkalemia 02/28/2018  . Hypertension   . Hypothyroidism 10/10/2011  . Iron deficiency anemia due to chronic blood loss   .  Thrombocytopenia ([place]) 10/11/2011  . Type 2 diabetes with nephropathy [facility])    DM refresher course [facility] (04/2013)    Patient Active Problem List   Diagnosis Date Noted  . Abnormal skin growth 05/11/2019  . Left foot pain 11/17/2018  . Cervical spondylosis 03/27/2018  . Carotid stenosis, asymptomatic, bilateral 03/26/2018  . Sternal fracture 09/13/2017  . Tinea corporis 08/23/2017  . Pedal edema 08/23/2017  . Right knee pain 05/19/2017  . Glaucoma   . Colon polyps   . Arthritis   . CLL (chronic lymphocytic leukemia) ([name]) 01/18/2016  . Chronic insomnia 01/03/2016  . Diabetic neuropathy ([name]) 06/09/2015  . BPV (benign positional vertigo) 05/23/2014  . Advanced care planning/counseling discussion 01/25/2014  . Hyperlipidemia associated with type 2 diabetes mellitus ([place])   . CKD (chronic kidney disease) stage 3, GFR 30-59 ml/min (H[facility])   . Uncontrolled diabetes mellitus with stage 2 chronic kidney disease, with long-term current use of insulin ([name])   . Anemia in CKD (chronic kidney disease) 10/11/2011  . Idiopathic thrombocytopenic purpura (ITP) (H[facility]) 10/11/2011  . CAD (coronary artery disease) 10/10/2011  . Hypertension 10/10/2011  . Hypothyroidism 10/10/2011  . Acquired hand deformity 03/04/1960    Past Surgical History:  Procedure Laterality Date  . BACK SURGERY     cervical neck  . CATARACT EXTRACTION, BILATERAL  Bilateral 2017  . COLONOSCOPY  11/2008   1 polyp, diverticulosis, rec rpt 5 yrs (Dr. Oletta Lamas, [name])  . COLONOSCOPY  06/2014   hyperplastic polyp, rpt 5 yrs ([place])  . COLONOSCOPY WITH PROPOFOL N/A 11/17/2017   TA, HP, (Vanga, Tally Due, MD)  . ESOPHAGOGASTRODUODENOSCOPY (EGD) WITH PROPOFOL N/A 11/17/2017   healing erosive gastritis, intestinal metaplasia, neg H pylori (Vanga, Tally Due, MD)  . EYE SURGERY  2012   laser surgery for glaucoma  . PORTACATH PLACEMENT Left 12/09/2018   Procedure: INSERTION PORT-A-CATH;  Surgeon: [name], MD;  Location: [facility]  ORS;  Service: General;  Laterality: Left;  . PTCA  1994, 1995  . US ECHOCARDIOGRAPHY  10/2013   inferior wall hypokinesis, mild LVH, EF 50-55%, mild MR and LA dilation    Prior to Admission medications   Medication Sig Start Date End Date Taking? Authorizing Provider  acetaminophen (TYLENOL) 650 MG CR tablet Take 1,300 mg by mouth 2 (two) times daily.     [provider]  amLODipine (NORVASC) 5 MG tablet Take 1 tablet (5 mg total) by mouth daily. 12/30/18   Burtis Junes, NP  atorvastatin (LIPITOR) 80 MG tablet Take 1 tablet by mouth once daily 04/23/19   [name], MD  carvedilol (COREG) 6.25 MG tablet Take 1 tablet (6.25 mg total) by mouth 2 (two) times daily with a meal. 01/13/19   Dunn, Dayna N, PA-C  docusate sodium (COLACE) 100 MG capsule Take 100 mg by mouth daily as needed.     [provider]  glucose blood (ONETOUCH ULTRA) test strip USE 1 STRIP TO CHECK GLUCOSE 3 TIMES DAILY 03/25/19   [name], MD  insulin aspart (NOVOLOG FLEXPEN) 100 UNIT/ML FlexPen Take 5 units with meals, take 10 units if mealtime sugar >250 Patient taking differently: Inject 5-10 Units into the skin See admin instructions. Take 5 units with meals, take 10 units if mealtime sugar >250 06/11/18   [name], MD  LEVEMIR FLEXTOUCH 100 UNIT/ML Pen INJECT 25 UNITS SUBCUTANEOUSLY ONCE DAILY AT  10PM 04/14/19   [name], MD  levothyroxine (SYNTHROID) 88 MCG tablet Take 1 tablet by mouth once daily 03/22/19   [name], MD  nitroGLYCERIN (NITROSTAT) 0.4 MG SL tablet Place 1 tablet (0.4 mg total) under the tongue every 5 (five) minutes as needed for chest pain ((MAX of 3 doses)). Patient not taking: Reported on 06/01/2019 02/27/18   [name], MD  oxymetazoline (AFRIN) 0.05 % nasal spray Place 1 spray into left nostril daily as needed (nose bleeds).    [provider]  traZODone (DESYREL) 100 MG tablet TAKE 1 TABLET BY MOUTH AT BEDTIME AS NEEDED FOR  SLEEP 06/11/19   [name], MD    Allergies Patient has no known allergies.  Family History  Problem Relation Age of Onset  . Diabetes Sister   . Stomach cancer Sister 20  . Pneumonia Father        caused death  . Other Brother        no communication with brother so unsure of any health conditions  . Coronary artery disease Son 2       5v CABG and stents  . Hyperlipidemia Sister   . Stroke Neg Hx   . Heart attack Neg Hx     Social History Social History   Tobacco Use  . Smoking status: Former Smoker    Quit date: 03/04/1978    Years since quitting: 41.2  . Smokeless tobacco: Never  Used  Substance Use Topics  . Alcohol use: No  . Drug use: No    Review of Systems Constitutional: No fever/chills Eyes: No visual changes though he did feel like he had a little bit of a blurry feeling in the right eye after the fall but that is getting better going away now.  No loss of vision. ENT: No sore throat.  Some slight discomfort in the back of his upper neck at times since falling Cardiovascular: Denies chest pain. Respiratory: Denies shortness of breath. Gastrointestinal: No abdominal pain.   Genitourinary: Negative for dysuria. Musculoskeletal: Negative for back pain. Skin: Negative for rash. Neurological: Negative for headaches, areas of focal weakness or numbness.    ____________________________________________   PHYSICAL EXAM:  VITAL SIGNS: ED Triage Vitals  Enc Vitals Group     BP 06/11/19 0920 (!) 147/79     Pulse Rate 06/11/19 0920 67     Resp 06/11/19 0920 17     Temp 06/11/19 0920 97.6 F (36.4 C)     Temp Source 06/11/19 0920 Oral     SpO2 06/11/19 0920 99 %     Weight 06/11/19 0920 150 lb (68 kg)     Height 06/11/19 0920 5\' 11"  (1.803 m)     Head Circumference --      Peak Flow --      Pain Score 06/11/19 0930 0     Pain Loc --      Pain Edu? --      Excl. in [place]? --     Constitutional: Alert and oriented. Well appearing and in no acute  distress. Eyes: Conjunctivae are normal.  Examination with ophthalmoscope reveals no dark or obstructed retina.  Posterior retina is crisp on the right side. Head: Atraumatic except for some ecchymosis without significant swelling or edema just over the lateral right orbit.  There is no proptosis.  There is no hematoma.. Nose: No congestion/rhinnorhea. Mouth/Throat: Mucous membranes are moist.  Ports some mild discomfort in his upper neck with range of motion, reports very minimal just feels little achy since the fall. Neck: No stridor.  Cardiovascular: Normal rate, regular rhythm. Grossly normal heart sounds.  Good peripheral circulation. Respiratory: Normal respiratory effort.  No retractions. Lungs CTAB. Gastrointestinal: Soft and nontender. No distention. Musculoskeletal: No lower extremity tenderness nor edema. Neurologic:  Normal speech and language. No gross focal neurologic deficits are appreciated.  Skin:  Skin is warm, dry and intact. No rash noted. Psychiatric: Mood and affect are normal. Speech and behavior are normal.  ____________________________________________   LABS (all labs ordered are listed, but only abnormal results are displayed)  Labs Reviewed  CBC WITH DIFFERENTIAL/PLATELET - Abnormal; Notable for the following components:      Result Value   WBC 3.0 (*)    RBC 3.45 (*)    Hemoglobin 10.4 (*)    HCT 31.9 (*)    Platelets 136 (*)    Lymphs Abs 0.3 (*)    All other components within normal limits  BASIC METABOLIC PANEL - Abnormal; Notable for the following components:   Glucose, Bld 191 (*)    BUN 53 (*)    Creatinine, Ser 1.59 (*)    GFR calc non Af Amer 41 (*)    GFR calc Af Amer 47 (*)    All other components within normal limits   ____________________________________________  EKG  Is reviewed interpreted by me at 930 Heart rate 60 QRS 100 QTc 440 Occasional PVC.  Sinus rhythm,  probable LVH.  No evidence of acute  ischemia ____________________________________________  RADIOLOGY  CT Head Wo Contrast  Result Date: 06/11/2019 CLINICAL DATA:  Recent fall with headaches and neck pain, initial encounter EXAM: CT HEAD WITHOUT CONTRAST CT CERVICAL SPINE WITHOUT CONTRAST TECHNIQUE: Multidetector CT imaging of the head and cervical spine was performed following the standard protocol without intravenous contrast. Multiplanar CT image reconstructions of the cervical spine were also generated. COMPARISON:  None. FINDINGS: CT HEAD FINDINGS Brain: Mild atrophic changes and chronic white ischemic changes seen. No findings to suggest acute hemorrhage, acute infarction or space-occupying mass lesion is seen. Vascular: No hyperdense vessel or unexpected calcification. Skull: Normal. Negative for fracture or focal lesion. Sinuses/Orbits: No acute finding. Other: None. CT CERVICAL SPINE FINDINGS Alignment: Within normal limits. Skull base and vertebrae: 7 cervical segments are well visualized. Vertebral body height is well maintained. Multilevel osteophytic changes and facet hypertrophic changes are seen. No acute fracture or acute facet abnormality is noted. Soft tissues and spinal canal: Surrounding soft tissue structures are within normal limits. Upper chest: Visualized lung apices are unremarkable. Other: None IMPRESSION: CT of the head: Chronic atrophic and ischemic changes without acute abnormality. CT of the cervical spine: Multilevel degenerative change without acute abnormality. Electronically Signed   By: Inez Catalina M.D.   On: 06/11/2019 12:37   CT Cervical Spine Wo Contrast  Result Date: 06/11/2019 CLINICAL DATA:  Recent fall with headaches and neck pain, initial encounter EXAM: CT HEAD WITHOUT CONTRAST CT CERVICAL SPINE WITHOUT CONTRAST TECHNIQUE: Multidetector CT imaging of the head and cervical spine was performed following the standard protocol without intravenous contrast. Multiplanar CT image reconstructions of the  cervical spine were also generated. COMPARISON:  None. FINDINGS: CT HEAD FINDINGS Brain: Mild atrophic changes and chronic white ischemic changes seen. No findings to suggest acute hemorrhage, acute infarction or space-occupying mass lesion is seen. Vascular: No hyperdense vessel or unexpected calcification. Skull: Normal. Negative for fracture or focal lesion. Sinuses/Orbits: No acute finding. Other: None. CT CERVICAL SPINE FINDINGS Alignment: Within normal limits. Skull base and vertebrae: 7 cervical segments are well visualized. Vertebral body height is well maintained. Multilevel osteophytic changes and facet hypertrophic changes are seen. No acute fracture or acute facet abnormality is noted. Soft tissues and spinal canal: Surrounding soft tissue structures are within normal limits. Upper chest: Visualized lung apices are unremarkable. Other: None IMPRESSION: CT of the head: Chronic atrophic and ischemic changes without acute abnormality. CT of the cervical spine: Multilevel degenerative change without acute abnormality. Electronically Signed   By: Inez Catalina M.D.   On: 06/11/2019 12:37    Imaging studies reviewed, negative for acute. ____________________________________________   PROCEDURES  Procedure(s) performed: None  Procedures  Critical Care performed: No  ____________________________________________   INITIAL IMPRESSION / ASSESSMENT AND PLAN / ED COURSE  Pertinent labs & imaging results that were available during my care of the patient were reviewed by me and considered in my medical decision making (see chart for details).   Patient presents after a fall.  Has a history of thrombocytopenia in the past, this is improved stable today.  His lab work appears stable.  Reports a mechanical fall.  Does have some bruising over the right side of face and given his low platelet count seems reasonable obtain imaging studies which are reassuring and negative.  Patient denies any preceding  symptoms denies any acute medical illness.  Lab work reassuring.  Discussed with the patient, and he is comfortable with plan for discharge  and follow-up.  Return precautions and treatment recommendations and follow-up discussed with the patient who is agreeable with the plan.      ____________________________________________   FINAL CLINICAL IMPRESSION(S) / ED DIAGNOSES  Final diagnoses:  Fall, initial encounter  Contusion of face, initial encounter        Note:  This document was prepared using Systems analyst and may include unintentional dictation errors       Delman Kitten, MD 06/11/19 1315

## 2019-06-15 ENCOUNTER — Other Ambulatory Visit: Payer: Self-pay

## 2019-06-15 ENCOUNTER — Telehealth: Payer: Self-pay

## 2019-06-15 ENCOUNTER — Ambulatory Visit (INDEPENDENT_AMBULATORY_CARE_PROVIDER_SITE_OTHER): Payer: Medicare Other | Admitting: Dermatology

## 2019-06-15 DIAGNOSIS — L570 Actinic keratosis: Secondary | ICD-10-CM | POA: Diagnosis not present

## 2019-06-15 DIAGNOSIS — D0421 Carcinoma in situ of skin of right ear and external auricular canal: Secondary | ICD-10-CM | POA: Diagnosis not present

## 2019-06-15 DIAGNOSIS — D692 Other nonthrombocytopenic purpura: Secondary | ICD-10-CM | POA: Diagnosis not present

## 2019-06-15 DIAGNOSIS — D485 Neoplasm of uncertain behavior of skin: Secondary | ICD-10-CM

## 2019-06-15 DIAGNOSIS — C4492 Squamous cell carcinoma of skin, unspecified: Secondary | ICD-10-CM

## 2019-06-15 HISTORY — DX: Squamous cell carcinoma of skin, unspecified: C44.92

## 2019-06-15 NOTE — Progress Notes (Signed)
   New Patient Visit  Subjective  Scott [name] is a 79 y.o. male who presents for the following: Growth (right post ear x 2 mos. Pt fell, scab came off, and is improving some now. No pain, no itch, no bleeding.) and spots (left ear x 2 mos, scaly).   Objective  Well appearing patient in no apparent distress; mood and affect are within normal limits.  A focused examination was performed including face, ears, scalp. Relevant physical exam findings are noted in the Assessment and Plan.  Objective  Left Ear Helix x 5, L antihelix x 2, R antihelix x 1 (8): Erythematous thin papules/macules with gritty scale.   Objective  Right Posterior Ear: 2 x 1.0cm pink scaly plaque with crusting     Objective  Right Periocular: Violaceous macules and patches.   Assessment & Plan  AK (actinic keratosis) (8) Left Ear Helix x 5, L antihelix x 2, R antihelix x 1  Destruction of lesion - Left Ear Helix x 5, L antihelix x 2, R antihelix x 1  Destruction method: cryotherapy   Informed consent: discussed and consent obtained   Lesion destroyed using liquid nitrogen: Yes   Region frozen until ice ball extended beyond lesion: Yes   Outcome: patient tolerated procedure well with no complications   Post-procedure details: wound care instructions given    Neoplasm of uncertain behavior of skin Right Posterior Ear  Skin / nail biopsy Type of biopsy: tangential   Informed consent: discussed and consent obtained   Patient was prepped and draped in usual sterile fashion: Area prepped with alcohol. Anesthesia: the lesion was anesthetized in a standard fashion   Anesthetic:  1% lidocaine w/ epinephrine 1-100,000 buffered w/ 8.4% NaHCO3 Instrument used: flexible razor blade   Hemostasis achieved with: pressure, aluminum chloride and electrodesiccation   Outcome: patient tolerated procedure well   Post-procedure details: wound care instructions given   Post-procedure details comment:  Ointment and  small bandage applied  Specimen 1 - Surgical pathology Differential Diagnosis: Inflamed SK R/O BCC/SCC Check Margins: No 2 x 1.0cm pink scaly plaque with crusting *2 pieces  Senile purpura ([place]) Right Periocular  From recent fall. Benign, observe.    Return if symptoms worsen or fail to improve, for Pending bx.   IJamesetta Orleans, CMA, am acting as scribe for Brendolyn Patty, MD .

## 2019-06-15 NOTE — Patient Instructions (Signed)

## 2019-06-15 NOTE — Telephone Encounter (Signed)
SW attempted to contact patient to schedule visit. SW left VM with contact information.  

## 2019-06-18 ENCOUNTER — Telehealth: Payer: Self-pay

## 2019-06-18 NOTE — Telephone Encounter (Signed)
Advised pt of bx results and scheduled appt for [facility] of [facility]./sh

## 2019-06-18 NOTE — Telephone Encounter (Signed)
-----   Message from Brendolyn Patty, MD sent at 06/18/2019 10:44 AM EDT ----- Skin , right posterior ear SQUAMOUS CELL CARCINOMA IN SITU, HYPERTROPHIC, CRUSTED  S[facility]IS skin cancer. Pt needs appointment for [facility].

## 2019-06-24 ENCOUNTER — Telehealth: Payer: Self-pay

## 2019-06-24 ENCOUNTER — Other Ambulatory Visit
Admission: RE | Admit: 2019-06-24 | Discharge: 2019-06-24 | Disposition: A | Discharge status: Home / Self Care | Payer: Medicare Other | Source: Ambulatory Visit | Attending: Gastroenterology | Admitting: Gastroenterology

## 2019-06-24 ENCOUNTER — Other Ambulatory Visit: Payer: Self-pay

## 2019-06-24 DIAGNOSIS — Z20822 Contact with and (suspected) exposure to covid-19: Secondary | ICD-10-CM | POA: Insufficient documentation

## 2019-06-24 DIAGNOSIS — Z01812 Encounter for preprocedural laboratory examination: Secondary | ICD-10-CM | POA: Insufficient documentation

## 2019-06-24 LAB — SARS CORONAVIRUS 2 (TAT 6-24 HRS): SARS Coronavirus 2: NEGATIVE

## 2019-06-24 NOTE — Telephone Encounter (Signed)
Telephone call to patient to schedule palliative care visit with patient. Patient/family in agreement with home visit on 06-25-19 at 1:00PM.

## 2019-06-25 ENCOUNTER — Other Ambulatory Visit: Payer: Medicare Other

## 2019-06-25 DIAGNOSIS — Z515 Encounter for palliative care: Secondary | ICD-10-CM

## 2019-06-25 NOTE — Progress Notes (Signed)
COMMUNITY PALLIATIVE CARE SW NOTE  PATIENT NAME: Scott [name] DOB: December 11, 1940 MRN: [id number]  PRIMARY CARE PROVIDER: [name], MD  RESPONSIBLE PARTY:  Acct ID - Guarantor Home Phone Work Phone Relationship Acct Type  [id number] - [place],[name]  Self P/F     [place], [place],  [address]     PLAN OF CARE and INTERVENTIONS:             1. GOALS OF CARE/ ADVANCE CARE PLANNING:  Patient is a FULL CODE. HCPOA is [name], patient's son. Patient has Living Will complete. Goal is to continue to improve and maintain independence.  2. SOCIAL/EMOTIONAL/SPIRITUAL ASSESSMENT/ INTERVENTIONS:  SW met with patient in his home. Discussed ED visit on 4/9 following a fall that occurred on 4/3. Patient said they did CT scan, and patient did "bruise" his brain. Patient showed SW his discharge papers and the education they provided on head injuries. Patient denies any concerns since ED visit other than some soreness around his eye. Patient reports that he does have a skin cancer on his right ear, plan to remove this spot soon. Patient continues to log his vital signs. Patient said he is monitoring his blood sugar and feels he is doing better with this. Patient reports his appetite is good. Patient said he is sleeping good. 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 Patient said he feels "good" overall. SW reviewed goals, used active and reflective listening, and provided emotional support. 3. PATIENT/CAREGIVER EDUCATION/ COPING:  Patient was alert, oriented. Patient was very engaged in conversation. Patient openly expresses his feelings with SW. Patient enjoys the company of his girlfriend and feels that she is motivation for him. Patient's family and friends are very supportive.  4. PERSONAL EMERGENCY PLAN:  Family will call 9-1-1 for emergencies. 5. COMMUNITY RESOURCES COORDINATION/ HEALTH CARE NAVIGATION:  Patient and [name]  manage care. Patient has a large calendar that helps him keep track of appointments. Patient is going on Monday for a EGD. Maevis (patient's girlfriend) is going to take patient to this procedure and help with care post-op. Patient has dermatology appointment on 5/4. PCP follow-up is scheduled on 5/11. 6. FINANCIAL/LEGAL CONCERNS/INTERVENTIONS:  None.     SOCIAL HX:  Social History   Tobacco Use  . Smoking status: Former Smoker    Quit date: 03/04/1978    Years since quitting: 41.3  . Smokeless tobacco: Never Used  Substance Use Topics  . Alcohol use: No    CODE STATUS: FULL CODE ADVANCED DIRECTIVES: Y MOST FORM COMPLETE:  No. HOSPICE EDUCATION PROVIDED: None.  PPS: Patient is independent of ADLs.  I spent48mnutes with patient/family, from1:30-2:00pproviding education, support and consultation.  WMargaretmary Lombard LCSW

## 2019-06-28 ENCOUNTER — Encounter: Payer: Self-pay | Admitting: Gastroenterology

## 2019-06-28 ENCOUNTER — Ambulatory Visit: Payer: Medicare Other | Admitting: Anesthesiology

## 2019-06-28 ENCOUNTER — Other Ambulatory Visit: Payer: Self-pay

## 2019-06-28 ENCOUNTER — Encounter
Admission: RE | Disposition: A | Discharge status: Home / Self Care | Payer: Self-pay | Source: Home / Self Care | Attending: Gastroenterology

## 2019-06-28 ENCOUNTER — Ambulatory Visit
Admission: RE | Admit: 2019-06-28 | Discharge: 2019-06-28 | Disposition: A | Discharge status: Home / Self Care | Payer: Medicare Other | Attending: Gastroenterology | Admitting: Gastroenterology

## 2019-06-28 DIAGNOSIS — K2970 Gastritis, unspecified, without bleeding: Secondary | ICD-10-CM

## 2019-06-28 DIAGNOSIS — Z794 Long term (current) use of insulin: Secondary | ICD-10-CM | POA: Diagnosis not present

## 2019-06-28 DIAGNOSIS — Z856 Personal history of leukemia: Secondary | ICD-10-CM | POA: Diagnosis not present

## 2019-06-28 DIAGNOSIS — I129 Hypertensive chronic kidney disease with stage 1 through stage 4 chronic kidney disease, or unspecified chronic kidney disease: Secondary | ICD-10-CM | POA: Insufficient documentation

## 2019-06-28 DIAGNOSIS — K3189 Other diseases of stomach and duodenum: Secondary | ICD-10-CM | POA: Diagnosis not present

## 2019-06-28 DIAGNOSIS — K319 Disease of stomach and duodenum, unspecified: Secondary | ICD-10-CM | POA: Diagnosis not present

## 2019-06-28 DIAGNOSIS — Z8719 Personal history of other diseases of the digestive system: Secondary | ICD-10-CM | POA: Insufficient documentation

## 2019-06-28 DIAGNOSIS — E1122 Type 2 diabetes mellitus with diabetic chronic kidney disease: Secondary | ICD-10-CM | POA: Diagnosis not present

## 2019-06-28 DIAGNOSIS — K449 Diaphragmatic hernia without obstruction or gangrene: Secondary | ICD-10-CM | POA: Diagnosis not present

## 2019-06-28 DIAGNOSIS — Z87891 Personal history of nicotine dependence: Secondary | ICD-10-CM | POA: Diagnosis not present

## 2019-06-28 DIAGNOSIS — E039 Hypothyroidism, unspecified: Secondary | ICD-10-CM | POA: Diagnosis not present

## 2019-06-28 DIAGNOSIS — D631 Anemia in chronic kidney disease: Secondary | ICD-10-CM | POA: Diagnosis not present

## 2019-06-28 DIAGNOSIS — E785 Hyperlipidemia, unspecified: Secondary | ICD-10-CM | POA: Insufficient documentation

## 2019-06-28 DIAGNOSIS — Z9861 Coronary angioplasty status: Secondary | ICD-10-CM | POA: Insufficient documentation

## 2019-06-28 DIAGNOSIS — R1310 Dysphagia, unspecified: Secondary | ICD-10-CM

## 2019-06-28 DIAGNOSIS — K259 Gastric ulcer, unspecified as acute or chronic, without hemorrhage or perforation: Secondary | ICD-10-CM | POA: Diagnosis not present

## 2019-06-28 DIAGNOSIS — N183 Chronic kidney disease, stage 3 unspecified: Secondary | ICD-10-CM | POA: Insufficient documentation

## 2019-06-28 DIAGNOSIS — K31A0 Gastric intestinal metaplasia, unspecified: Secondary | ICD-10-CM

## 2019-06-28 DIAGNOSIS — I2510 Atherosclerotic heart disease of native coronary artery without angina pectoris: Secondary | ICD-10-CM | POA: Insufficient documentation

## 2019-06-28 DIAGNOSIS — T182XXA Foreign body in stomach, initial encounter: Secondary | ICD-10-CM | POA: Diagnosis not present

## 2019-06-28 DIAGNOSIS — K2100 Gastro-esophageal reflux disease with esophagitis, without bleeding: Secondary | ICD-10-CM | POA: Diagnosis not present

## 2019-06-28 DIAGNOSIS — Z79890 Hormone replacement therapy (postmenopausal): Secondary | ICD-10-CM | POA: Insufficient documentation

## 2019-06-28 DIAGNOSIS — Z79899 Other long term (current) drug therapy: Secondary | ICD-10-CM | POA: Diagnosis not present

## 2019-06-28 DIAGNOSIS — I252 Old myocardial infarction: Secondary | ICD-10-CM | POA: Insufficient documentation

## 2019-06-28 DIAGNOSIS — K2950 Unspecified chronic gastritis without bleeding: Secondary | ICD-10-CM | POA: Diagnosis not present

## 2019-06-28 HISTORY — PX: ESOPHAGOGASTRODUODENOSCOPY (EGD) WITH PROPOFOL: SHX5813

## 2019-06-28 LAB — GLUCOSE, CAPILLARY: Glucose-Capillary: 112 mg/dL — ABNORMAL HIGH (ref 70–99)

## 2019-06-28 SURGERY — ESOPHAGOGASTRODUODENOSCOPY (EGD) WITH PROPOFOL
Anesthesia: General

## 2019-06-28 MED ORDER — PROPOFOL 500 MG/50ML IV EMUL
INTRAVENOUS | Status: DC | PRN
  Administered 2019-06-28: 140 ug/kg/min via INTRAVENOUS
  Administered 2019-06-28: 40 mg via INTRAVENOUS

## 2019-06-28 MED ORDER — PROPOFOL 500 MG/50ML IV EMUL
INTRAVENOUS | Status: AC
  Filled 2019-06-28: qty 50

## 2019-06-28 MED ORDER — LIDOCAINE HCL (CARDIAC) PF 100 MG/5ML IV SOSY
PREFILLED_SYRINGE | INTRAVENOUS | Status: DC | PRN
  Administered 2019-06-28: 50 mg via INTRAVENOUS

## 2019-06-28 MED ORDER — SODIUM CHLORIDE 0.9 % IV SOLN: INTRAVENOUS | Status: DC

## 2019-06-28 MED ORDER — PROPOFOL 10 MG/ML IV BOLUS
INTRAVENOUS | Status: AC
  Filled 2019-06-28: qty 20

## 2019-06-28 MED ORDER — OMEPRAZOLE 40 MG PO CPDR: 40.0000 mg | DELAYED_RELEASE_CAPSULE | Freq: Every day | ORAL | 0 refills | Status: DC

## 2019-06-28 NOTE — Transfer of Care (Signed)
Immediate Anesthesia Transfer of Care Note  Patient: Scott [name]  Procedure(s) Performed: ESOPHAGOGASTRODUODENOSCOPY (EGD) WITH PROPOFOL (N/A )  Patient Location: PACU  Anesthesia Type:General  Level of Consciousness: awake, alert  and oriented  Airway & Oxygen Therapy: Patient Spontanous Breathing and Patient connected to nasal cannula oxygen  Post-op Assessment: Report given to RN and Post -op Vital signs reviewed and stable  Post vital signs: Reviewed and stable  Last Vitals:  Vitals Value Taken Time  BP    Temp    Pulse 62 06/28/19 0913  Resp 15 06/28/19 0913  SpO2 100 % 06/28/19 0913  Vitals shown include unvalidated device data.  Last Pain:  Vitals:   06/28/19 0807  PainSc: 0-No pain         Complications: No apparent anesthesia complications

## 2019-06-28 NOTE — Anesthesia Preprocedure Evaluation (Addendum)
Anesthesia Evaluation  Patient identified by MRN, date of birth, ID band Patient awake    Reviewed: Allergy & Precautions, H&P , NPO status , Patient's Chart, lab work & pertinent test results  Airway Mallampati: II  TM Distance: <3 FB Neck ROM: full    Dental  (+) Upper Dentures, Lower Dentures   Pulmonary former smoker,    breath sounds clear to auscultation       Cardiovascular hypertension, + CAD   Rhythm:regular Rate:Normal  Carotid stenosis   Neuro/Psych  Headaches, negative psych ROS   GI/Hepatic negative GI ROS, Neg liver ROS,   Endo/Other  diabetesHypothyroidism   Renal/GU Renal disease (CRI)  negative genitourinary   Musculoskeletal   Abdominal   Peds  Hematology  (+) Blood dyscrasia, anemia , CLL thrombocytopenia   Anesthesia Other Findings Past Medical History: 1962: Acquired hand deformity     Comment:  hand saw accident at work 10/11/2011: Anemia No date: Arthritis 10/10/2011: Bradycardia 10/10/2011: CAD (coronary artery disease)     Comment:  MI s/p PTCA (Dx-OM2 proximal concentric stenosis) No date: Carotid artery disease ([place]) 11/17/2018: Cellulitis of buttock, left No date: Chronic edema No date: CKD (chronic kidney disease) stage 3, GFR 30-59 ml/min     Comment:  [name] No date: CLL (chronic lymphocytic leukemia) (HCC) No date: Colon polyps 04/28/2019: Cough No date: Generalized headaches     Comment:  frequent 07/08/2017: GI bleed No date: Glaucoma     Comment:  s/p laser surgery No date: HLD (hyperlipidemia) 02/28/2018: Hyperkalemia No date: Hypertension 10/10/2011: Hypothyroidism No date: Iron deficiency anemia due to chronic blood loss 10/11/2011: Thrombocytopenia ([place]) No date: Type 2 diabetes with nephropathy ([place])     Comment:  DM refresher course [facility] (04/2013)  Past Surgical History: No date: BACK SURGERY     Comment:  cervical neck 2017: CATARACT EXTRACTION, BILATERAL;  Bilateral 11/2008: COLONOSCOPY     Comment:  1 polyp, diverticulosis, rec rpt 5 yrs (Dr. Oletta Lamas,               [name]) 06/2014: COLONOSCOPY     Comment:  hyperplastic polyp, rpt 5 yrs Oletta Lamas) 11/17/2017: COLONOSCOPY WITH PROPOFOL; N/A     Comment:  TA, HP, Lin Landsman, MD) 11/17/2017: ESOPHAGOGASTRODUODENOSCOPY (EGD) WITH PROPOFOL; N/A     Comment:  healing erosive gastritis, intestinal metaplasia, neg H               pylori Marius Ditch, Tally Due, MD) 2012: EYE SURGERY     Comment:  laser surgery for glaucoma 12/09/2018: PORTACATH PLACEMENT; Left     Comment:  Procedure: INSERTION PORT-A-CATH;  Surgeon: [name], MD;  Location: [facility] ORS;  Service: General;                Laterality: Left; 1994, 1995: PTCA 10/2013: US ECHOCARDIOGRAPHY     Comment:  inferior wall hypokinesis, mild LVH, EF 50-55%, mild MR               and LA dilation     Reproductive/Obstetrics negative OB ROS                            Anesthesia Physical Anesthesia Plan  ASA: III  Anesthesia Plan: General   Post-op Pain Management:    Induction:   PONV Risk Score and Plan: Propofol infusion and TIVA  Airway Management Planned: Natural  Airway and Nasal Cannula  Additional Equipment:   Intra-op Plan:   Post-operative Plan:   Informed Consent: I have reviewed the patients History and Physical, chart, labs and discussed the procedure including the risks, benefits and alternatives for the proposed anesthesia with the patient or authorized representative who has indicated his/her understanding and acceptance.     Dental Advisory Given  Plan Discussed with: Anesthesiologist  Anesthesia Plan Comments:        Anesthesia Quick Evaluation

## 2019-06-28 NOTE — H&P (Signed)
[name], MD [address]  [place]  [place], [place] [address]  Main: [phone]  Fax: [phone] Pager: [phone]  Primary Care Physician:  [name], MD Primary Gastroenterologist:  Dr. [name]  Pre-Procedure History & Physical: HPI:  Scott [name] is a 79 y.o. male is here for an endoscopy.   Past Medical History:  Diagnosis Date  . Acquired hand deformity 1962   hand saw accident at work  . Anemia 10/11/2011  . Arthritis   . Bradycardia 10/10/2011  . CAD (coronary artery disease) 10/10/2011   MI s/p PTCA (Dx-OM2 proximal concentric stenosis)  . Carotid artery disease ([place])   . Cellulitis of buttock, left 11/17/2018  . Chronic edema   . CKD (chronic kidney disease) stage 3, GFR 30-59 ml/min    [name]  . CLL (chronic lymphocytic leukemia) ([place])   . Colon polyps   . Cough 04/28/2019  . Generalized headaches    frequent  . GI bleed 07/08/2017  . Glaucoma    s/p laser surgery  . HLD (hyperlipidemia)   . Hyperkalemia 02/28/2018  . Hypertension   . Hypothyroidism 10/10/2011  . Iron deficiency anemia due to chronic blood loss   . Thrombocytopenia ([place]) 10/11/2011  . Type 2 diabetes with nephropathy [facility])    DM refresher course [facility] (04/2013)    Past Surgical History:  Procedure Laterality Date  . BACK SURGERY     cervical neck  . CATARACT EXTRACTION, BILATERAL Bilateral 2017  . COLONOSCOPY  11/2008   1 polyp, diverticulosis, rec rpt 5 yrs (Dr. Oletta Lamas, [name])  . COLONOSCOPY  06/2014   hyperplastic polyp, rpt 5 yrs ([place])  . COLONOSCOPY WITH PROPOFOL N/A 11/17/2017   TA, HP, (Rane Blitch, Tally Due, MD)  . ESOPHAGOGASTRODUODENOSCOPY (EGD) WITH PROPOFOL N/A 11/17/2017   healing erosive gastritis, intestinal metaplasia, neg H pylori (Gavriela Cashin, Tally Due, MD)  . EYE SURGERY  2012   laser surgery for glaucoma  . PORTACATH PLACEMENT Left 12/09/2018   Procedure: INSERTION PORT-A-CATH;  Surgeon: [name], MD;  Location: [facility] ORS;  Service:  General;  Laterality: Left;  . PTCA  1994, 1995  . US ECHOCARDIOGRAPHY  10/2013   inferior wall hypokinesis, mild LVH, EF 50-55%, mild MR and LA dilation    Prior to Admission medications   Medication Sig Start Date End Date Taking? Authorizing Provider  amLODipine (NORVASC) 5 MG tablet Take 1 tablet (5 mg total) by mouth daily. 12/30/18  Yes Burtis Junes, NP  atorvastatin (LIPITOR) 80 MG tablet Take 1 tablet by mouth once daily 04/23/19  Yes [name], MD  carvedilol (COREG) 6.25 MG tablet Take 1 tablet (6.25 mg total) by mouth 2 (two) times daily with a meal. 01/13/19  Yes Dunn, Dayna N, PA-C  docusate sodium (COLACE) 100 MG capsule Take 100 mg by mouth daily as needed.    Yes [provider]  glucose blood (ONETOUCH ULTRA) test strip USE 1 STRIP TO CHECK GLUCOSE 3 TIMES DAILY 03/25/19  Yes [name], MD  insulin aspart (NOVOLOG FLEXPEN) 100 UNIT/ML FlexPen Take 5 units with meals, take 10 units if mealtime sugar >250 Patient taking differently: Inject 5-10 Units into the skin See admin instructions. Take 5 units with meals, take 10 units if mealtime sugar >250 06/11/18  Yes [name], MD  LEVEMIR FLEXTOUCH 100 UNIT/ML Pen INJECT 25 UNITS SUBCUTANEOUSLY ONCE DAILY AT  10PM 04/14/19  Yes [name], MD  levothyroxine (SYNTHROID) 88 MCG tablet Take 1 tablet by  mouth once daily 03/22/19  Yes [name], MD  oxymetazoline (AFRIN) 0.05 % nasal spray Place 1 spray into left nostril daily as needed (nose bleeds).   Yes [provider]  traZODone (DESYREL) 100 MG tablet TAKE 1 TABLET BY MOUTH AT BEDTIME AS NEEDED FOR SLEEP 06/11/19  Yes [name], MD  acetaminophen (TYLENOL) 650 MG CR tablet Take 1,300 mg by mouth 2 (two) times daily.     [provider]  nitroGLYCERIN (NITROSTAT) 0.4 MG SL tablet Place 1 tablet (0.4 mg total) under the tongue every 5 (five) minutes as needed for chest pain ((MAX of 3 doses)). Patient not taking:  Reported on 06/01/2019 02/27/18   [name], MD    Allergies as of 06/02/2019  . (No Known Allergies)    Family History  Problem Relation Age of Onset  . Diabetes Sister   . Stomach cancer Sister 66  . Pneumonia Father        caused death  . Other Brother        no communication with brother so unsure of any health conditions  . Coronary artery disease Son 63       5v CABG and stents  . Hyperlipidemia Sister   . Stroke Neg Hx   . Heart attack Neg Hx     Social History   Socioeconomic History  . Marital status: Widowed    Spouse name: Not on file  . Number of children: Not on file  . Years of education: Not on file  . Highest education level: Not on file  Occupational History  . Not on file  Tobacco Use  . Smoking status: Former Smoker    Quit date: 03/04/1978    Years since quitting: 41.3  . Smokeless tobacco: Never Used  Substance and Sexual Activity  . Alcohol use: No  . Drug use: No  . Sexual activity: Never  Other Topics Concern  . Not on file  Social History Narrative   Widower. Wife passed 08/2015 from cancer. 1 dog   Occupation: retired, was route Hotel manager   Edu: 11th gr   Activity: walks dog (pomeranian) 3-4x/day, yardwork, rides bicycle.   Diet: drinks diet coke, no vegetables   Social Determinants of Health   Financial Resource Strain:   . Difficulty of Paying Living Expenses:   Food Insecurity:   . Worried About Charity fundraiser in the Last Year:   . Arboriculturist in the Last Year:   Transportation Needs:   . Film/video editor (Medical):   Marland Kitchen Lack of Transportation (Non-Medical):   Physical Activity:   . Days of Exercise per Week:   . Minutes of Exercise per Session:   Stress:   . Feeling of Stress :   Social Connections:   . Frequency of Communication with Friends and Family:   . Frequency of Social Gatherings with Friends and Family:   . Attends Religious Services:   . Active Member of Clubs or Organizations:   . Attends  Archivist Meetings:   Marland Kitchen Marital Status:   Intimate Partner Violence:   . Fear of Current or Ex-Partner:   . Emotionally Abused:   Marland Kitchen Physically Abused:   . Sexually Abused:     Review of Systems: See HPI, otherwise negative ROS  Physical Exam: BP (!) 158/77   Pulse (!) 58   Temp (!) 96.8 F (36 C)   Resp 16   Ht 5\' 11"  (1.803 m)  Wt 68.2 kg   SpO2 100%   BMI 20.96 kg/m  General:   Alert,  pleasant and cooperative in NAD Head:  Normocephalic and atraumatic. Neck:  Supple; no masses or thyromegaly. Lungs:  Clear throughout to auscultation.    Heart:  Regular rate and rhythm. Abdomen:  Soft, nontender and nondistended. Normal bowel sounds, without guarding, and without rebound.   Neurologic:  Alert and  oriented x4;  grossly normal neurologically.  Impression/Plan: Scott [name] is here for an endoscopy to be performed for dysphagia, gastric metaplasia  Risks, benefits, limitations, and alternatives regarding  endoscopy have been reviewed with the patient.  Questions have been answered.  All parties agreeable.   Sherri Sear, MD  06/28/2019, 8:51 AM

## 2019-06-28 NOTE — Op Note (Signed)
[facility] Gastroenterology Patient Name: Scott [name] Procedure Date: 06/28/2019 8:22 AM MRN: [id number] Account #: [id number] Date of Birth: [birth year] Admit Type: Outpatient Age: 79 Room: [facility] ENDO ROOM 1 Gender: Male Note Status: Finalized Procedure:             Upper GI endoscopy Indications:           Dysphagia, Intestinal metaplasia, Follow-up of                         intestinal metaplasia Providers:             Lin Landsman MD, MD Medicines:             Monitored Anesthesia Care Complications:         No immediate complications. Estimated blood loss:                         Minimal. Procedure:             Pre-Anesthesia Assessment:                        - Prior to the procedure, a History and Physical was                         performed, and patient medications and allergies were                         reviewed. The patient is competent. The risks and                         benefits of the procedure and the sedation options and                         risks were discussed with the patient. All questions                         were answered and informed consent was obtained.                         Patient identification and proposed procedure were                         verified by the physician, the nurse, the                         anesthesiologist, the anesthetist and the technician                         in the pre-procedure area in the procedure room in the                         endoscopy suite. Mental Status Examination: alert and                         oriented. Airway Examination: normal oropharyngeal                         airway and neck mobility. Respiratory Examination:  clear to auscultation. CV Examination: normal.                         Prophylactic Antibiotics: The patient does not require                         prophylactic antibiotics. Prior Anticoagulants: The                         patient  has taken no previous anticoagulant or                         antiplatelet agents. ASA Grade Assessment: III - A                         patient with severe systemic disease. After reviewing                         the risks and benefits, the patient was deemed in                         satisfactory condition to undergo the procedure. The                         anesthesia plan was to use monitored anesthesia care                         (MAC). Immediately prior to administration of                         medications, the patient was re-assessed for adequacy                         to receive sedatives. The heart rate, respiratory                         rate, oxygen saturations, blood pressure, adequacy of                         pulmonary ventilation, and response to care were                         monitored throughout the procedure. The physical                         status of the patient was re-assessed after the                         procedure.                        After obtaining informed consent, the endoscope was                         passed under direct vision. Throughout the procedure,                         the patient's blood pressure, pulse, and oxygen  saturations were monitored continuously. The Endoscope                         was introduced through the mouth, and advanced to the                         second part of duodenum. The upper GI endoscopy was                         accomplished without difficulty. The patient tolerated                         the procedure well. Findings:      The duodenal bulb and second portion of the duodenum were normal.      Multiple dispersed small erosions with stigmata of recent bleeding were       found in the gastric antrum. Biopsies were taken with a cold forceps for       histology.      The gastric body was normal. Biopsies were taken with a cold forceps for       histology.      A medium  amount of food (residue) was found in the gastric fundus.      A small hiatal hernia was present.      Localized mucosal changes characterized by granularity were found at the       gastroesophageal junction. Biopsies were taken with a cold forceps for       histology.      The examined esophagus was normal. Impression:            - Normal duodenal bulb and second portion of the                         duodenum.                        - Erosive gastropathy with stigmata of recent                         bleeding. Biopsied.                        - Normal gastric body. Biopsied.                        - A medium amount of food (residue) in the stomach.                        - Small hiatal hernia.                        - Granular mucosa in the esophagus. Biopsied.                        - Normal esophagus. Recommendation:        - Await pathology results.                        - Discharge patient to home (with escort).                        -  Resume previous diet today.                        - Continue present medications.                        - Use Prilosec (omeprazole) 40 mg PO daily for 3                         months.                        - Return to my office as previously scheduled. Procedure Code(s):     --- Professional ---                        [phone], Esophagogastroduodenoscopy, flexible,                         transoral; with biopsy, single or multiple Diagnosis Code(s):     --- Professional ---                        K92.2, Gastrointestinal hemorrhage, unspecified                        K44.9, Diaphragmatic hernia without obstruction or                         gangrene                        K22.8, Other specified diseases of esophagus                        R13.10, Dysphagia, unspecified                        K31.89, Other diseases of stomach and duodenum CPT copyright 2019 American Medical Association. All rights reserved. The codes documented in this report  are preliminary and upon coder review may  be revised to meet current compliance requirements. Dr. Ulyess Mort Lin Landsman MD, MD 06/28/2019 9:14:29 AM This report has been signed electronically. Number of Addenda: 0 Note Initiated On: 06/28/2019 8:22 AM Estimated Blood Loss:  Estimated blood loss was minimal.      [facility]

## 2019-06-28 NOTE — Anesthesia Postprocedure Evaluation (Signed)
Anesthesia Post Note  Patient: Scott [name]  Procedure(s) Performed: ESOPHAGOGASTRODUODENOSCOPY (EGD) WITH PROPOFOL (N/A )  Patient location during evaluation: PACU Anesthesia Type: General Level of consciousness: awake and alert Pain management: pain level controlled Vital Signs Assessment: post-procedure vital signs reviewed and stable Respiratory status: spontaneous breathing, nonlabored ventilation and respiratory function stable Cardiovascular status: blood pressure returned to baseline and stable Postop Assessment: no apparent nausea or vomiting Anesthetic complications: no     Last Vitals:  Vitals:   06/28/19 0807 06/28/19 0913  BP: (!) 158/77 111/70  Pulse: (!) 58   Resp: 16   Temp: (!) 36 C (!) 35.7 C  SpO2: 100%     Last Pain:  Vitals:   06/28/19 0953  TempSrc:   PainSc: 0-No pain                 Tera Mater

## 2019-06-29 ENCOUNTER — Encounter: Payer: Self-pay | Admitting: *Deleted

## 2019-06-29 LAB — SURGICAL PATHOLOGY

## 2019-06-30 ENCOUNTER — Telehealth: Payer: Self-pay

## 2019-06-30 ENCOUNTER — Other Ambulatory Visit: Payer: Self-pay

## 2019-06-30 ENCOUNTER — Other Ambulatory Visit: Payer: Self-pay | Admitting: Gastroenterology

## 2019-06-30 DIAGNOSIS — K2970 Gastritis, unspecified, without bleeding: Secondary | ICD-10-CM

## 2019-06-30 DIAGNOSIS — K31A0 Gastric intestinal metaplasia, unspecified: Secondary | ICD-10-CM

## 2019-06-30 NOTE — Telephone Encounter (Signed)
Patient verbalized understanding of results. Patient made follow up appointment  °

## 2019-06-30 NOTE — Telephone Encounter (Signed)
-----   Message from Lin Landsman, MD sent at 06/30/2019  3:44 PM EDT ----- The biopsy results from recent upper endoscopy suggest that he has special type of inflammation in his stomach.  There is no evidence of cancer.  I recommend to check H. pylori IgG, ordered  Continue omeprazole 40 mg once daily before breakfast or dinner  Follow-up in 2 to 3 months  Rohini Vanga

## 2019-07-06 ENCOUNTER — Ambulatory Visit (INDEPENDENT_AMBULATORY_CARE_PROVIDER_SITE_OTHER): Payer: Medicare Other | Admitting: Dermatology

## 2019-07-06 ENCOUNTER — Other Ambulatory Visit: Payer: Self-pay

## 2019-07-06 DIAGNOSIS — D0421 Carcinoma in situ of skin of right ear and external auricular canal: Secondary | ICD-10-CM | POA: Diagnosis not present

## 2019-07-06 DIAGNOSIS — D099 Carcinoma in situ, unspecified: Secondary | ICD-10-CM

## 2019-07-06 DIAGNOSIS — L578 Other skin changes due to chronic exposure to nonionizing radiation: Secondary | ICD-10-CM

## 2019-07-06 MED ORDER — MUPIROCIN 2 % EX OINT: 1.0000 "application " | TOPICAL_OINTMENT | Freq: Two times a day (BID) | CUTANEOUS | 1 refills | Status: DC

## 2019-07-06 NOTE — Progress Notes (Signed)
   Follow-Up Visit   Subjective  Scott [name] is a 79 y.o. male who presents for the following: SCCis (Right post ear, bx proven 06/16/19). Here for treatment with EDC.   The following portions of the chart were reviewed this encounter and updated as appropriate:      Review of Systems:  No other skin or systemic complaints except as noted in HPI or Assessment and Plan.  Objective  Well appearing patient in no apparent distress; mood and affect are within normal limits.  A focused examination was performed including Right ear. Relevant physical exam findings are noted in the Assessment and Plan.  Objective  Right Posterior ear: Keratotic pink papule/nodule or plaque. 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  Destruction of lesion  Destruction method: electrodesiccation and curettage   Informed consent: discussed and consent obtained   Timeout:  patient name, date of birth, surgical site, and procedure verified Anesthesia: the lesion was anesthetized in a standard fashion   Anesthetic:  1% lidocaine w/ epinephrine 1-100,000 buffered w/ 8.4% NaHCO3 Curettage performed in three different directions: Yes   Electrodesiccation performed over the curetted area: Yes   Lesion length (cm):  2 Lesion width (cm):  1 Margin per side (cm):  0.1 Final wound size (cm):  2.2 Hemostasis achieved with:  pressure, aluminum chloride and electrodesiccation Outcome: patient tolerated procedure well with no complications   Post-procedure details: wound care instructions given   Additional details:  Mupirocin and bandaid applied  mupirocin ointment (BACTROBAN) 2 %  EDC Today Start mupirocin ointment, apply to wound once to twice daily and keep covered until healed if possible. Actinic Damage - diffuse scaly erythematous macules with underlying  dyspigmentation - Recommend daily broad spectrum sunscreen SPF 30+ to sun-exposed areas, reapply every 2 hours as needed.  - Call for new or changing lesions.  Return in about 3 months (around 10/06/2019) for [facility] post ear.  Marene Lenz, CMA, am acting as scribe for Brendolyn Patty, MD .  Documentation: I have reviewed the above documentation for accuracy and completeness, and I agree with the above.  Brendolyn Patty, MD

## 2019-07-06 NOTE — Patient Instructions (Signed)

## 2019-07-13 ENCOUNTER — Other Ambulatory Visit: Payer: Self-pay

## 2019-07-13 ENCOUNTER — Encounter: Payer: Self-pay | Admitting: Family Medicine

## 2019-07-13 ENCOUNTER — Ambulatory Visit (INDEPENDENT_AMBULATORY_CARE_PROVIDER_SITE_OTHER): Payer: Medicare Other | Admitting: Family Medicine

## 2019-07-13 VITALS — BP 122/70 | HR 66 | Temp 97.9°F | Ht 71.0 in | Wt 156.1 lb

## 2019-07-13 DIAGNOSIS — K2970 Gastritis, unspecified, without bleeding: Secondary | ICD-10-CM

## 2019-07-13 DIAGNOSIS — Z794 Long term (current) use of insulin: Secondary | ICD-10-CM | POA: Diagnosis not present

## 2019-07-13 DIAGNOSIS — E1122 Type 2 diabetes mellitus with diabetic chronic kidney disease: Secondary | ICD-10-CM

## 2019-07-13 DIAGNOSIS — I10 Essential (primary) hypertension: Secondary | ICD-10-CM

## 2019-07-13 DIAGNOSIS — N1832 Chronic kidney disease, stage 3b: Secondary | ICD-10-CM

## 2019-07-13 DIAGNOSIS — D0421 Carcinoma in situ of skin of right ear and external auricular canal: Secondary | ICD-10-CM

## 2019-07-13 DIAGNOSIS — K31A0 Gastric intestinal metaplasia, unspecified: Secondary | ICD-10-CM

## 2019-07-13 DIAGNOSIS — N183 Chronic kidney disease, stage 3 unspecified: Secondary | ICD-10-CM | POA: Diagnosis not present

## 2019-07-13 DIAGNOSIS — K3189 Other diseases of stomach and duodenum: Secondary | ICD-10-CM | POA: Diagnosis not present

## 2019-07-13 LAB — POCT GLYCOSYLATED HEMOGLOBIN (HGB A1C): Hemoglobin A1C: 7.6 % — AB (ref 4.0–5.6)

## 2019-07-13 MED ORDER — LISINOPRIL 5 MG PO TABS: 5.0000 mg | ORAL_TABLET | Freq: Every evening | ORAL | 6 refills | Status: DC

## 2019-07-13 NOTE — Assessment & Plan Note (Addendum)
Pending H pylori serology.  To continue omeprazole 40mg  daily.

## 2019-07-13 NOTE — Assessment & Plan Note (Signed)
Appreciate derm care. Recent EDC treatment.

## 2019-07-13 NOTE — Assessment & Plan Note (Addendum)
Chronic, some improvement. He endorses some low readings - see log. Many high readings recently as well. He is afraid to take humalog at times due to fear of hypoglycemia. He is not consistently using mealtime insulin. Will continue levemir 25u nightly, drop humalog to 3u with meals, 6u if CBG >250 at mealtime. RTC 3 mo f/u visit. Let me know sooner if low sugars continue.

## 2019-07-13 NOTE — Assessment & Plan Note (Signed)
Add low dose lisinopril 5mg  nightly given elevated BP readings recently - goal <130/80 in CKD, if tolerated

## 2019-07-13 NOTE — Progress Notes (Signed)
This visit was conducted in person.  BP 122/70 (BP Location: Left Arm, Patient Position: Sitting, Cuff Size: Normal)   Pulse 66   Temp 97.9 F (36.6 C) (Temporal)   Ht 5\' 11"  (1.803 m)   Wt 156 lb 2 oz (70.8 kg)   SpO2 95%   BMI 21.78 kg/m    CC: 6 mo f/u visit  Subjective:    Patient ID: Scott [name], male    DOB: November 09, 1940, 79 y.o.   MRN: [id number]  HPI: Scott [name] is a 79 y.o. 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   CLL - stable period.   Recent ER visit for fall - while helping GF's daughter move some things out of storage, tripped over shrub.   Gastritis with metaplasia and dysphagia s/p EGD ([facility]) - planned H pylori testing (pending), rec continue omeprazole 40mg  daily.   HTN - BP elevated in the mornings, then BP drops. Over the past 3 weeks averaging 140-170/60-70, HR 60-70s. Compliant with carvedilol 6.25mg  bid, amlodipine 5mg  daily.   DM - does regularly check sugars and brings log. Compliant with antihyperglycemic regimen which includes: levemir 25u at night and humalog 5u with meals, 10u if cbg >250. Doesn't take in evenings afraid sugars will drop. Often times doesn't take humalog with meals. Low sugars in am. Denies paresthesias. Last diabetic eye exam 01/2019. Pneumovax: 2013. Prevnar: 2015. Glucometer brand: onetouch. DSME: 2015, 2020. CGM was too expensive - $50 every 2 weeks.  Lab Results  Component Value Date   HGBA1C 7.6 (A) 07/13/2019   Diabetic Foot Exam - Simple   No data filed     Lab Results  Component Value Date   MICROALBUR 29.0 (H) 02/22/2019         Relevant past medical, surgical, family and social history reviewed and updated as indicated. Interim medical history since our last visit reviewed. Allergies and medications reviewed and updated. Outpatient Medications Prior  to Visit  Medication Sig Dispense Refill  . acetaminophen (TYLENOL) 650 MG CR tablet Take 1,300 mg by mouth 2 (two) times daily.     Marland Kitchen amLODipine (NORVASC) 5 MG tablet Take 1 tablet (5 mg total) by mouth daily. 90 tablet 3  . atorvastatin (LIPITOR) 80 MG tablet Take 1 tablet by mouth once daily 90 tablet 3  . carvedilol (COREG) 6.25 MG tablet Take 1 tablet (6.25 mg total) by mouth 2 (two) times daily with a meal. 180 tablet 3  . docusate sodium (COLACE) 100 MG capsule Take 100 mg by mouth daily as needed.     Marland Kitchen glucose blood (ONETOUCH ULTRA) test strip USE 1 STRIP TO CHECK GLUCOSE 3 TIMES DAILY 300 each 1  . insulin aspart (NOVOLOG FLEXPEN) 100 UNIT/ML FlexPen Take 5 units with meals, take 10 units if mealtime sugar >250 (Patient taking differently: Inject 5-10 Units into the skin See admin instructions. Take 5 units with meals, take 10 units if mealtime sugar >250) 15 mL 11  . LEVEMIR FLEXTOUCH 100 UNIT/ML Pen INJECT 25 UNITS SUBCUTANEOUSLY ONCE DAILY AT  10PM 15 mL 2  . levothyroxine (SYNTHROID) 88 MCG tablet Take 1 tablet by mouth once daily 90 tablet 1  . mupirocin ointment (BACTROBAN) 2 % Place 1 application into the nose 2 (two) times daily. Apply to wound once to twice daily. Keep wound  covered 22 g 1  . nitroGLYCERIN (NITROSTAT) 0.4 MG SL tablet Place 1 tablet (0.4 mg total) under the tongue every 5 (five) minutes as needed for chest pain ((MAX of 3 doses)). 25 tablet 0  . omeprazole (PRILOSEC) 40 MG capsule Take 1 capsule (40 mg total) by mouth daily before breakfast. 30 capsule 0  . oxymetazoline (AFRIN) 0.05 % nasal spray Place 1 spray into left nostril daily as needed (nose bleeds).    . traZODone (DESYREL) 100 MG tablet TAKE 1 TABLET BY MOUTH AT BEDTIME AS NEEDED FOR SLEEP 90 tablet 0   Facility-Administered Medications Prior to Visit  Medication Dose Route Frequency Provider Last Rate Last Admin  . sodium chloride flush (NS) 0.9 % injection 10 mL  10 mL Intravenous Once [name],  Kathlene November, MD         Per HPI unless specifically indicated in ROS section below Review of Systems Objective:  BP 122/70 (BP Location: Left Arm, Patient Position: Sitting, Cuff Size: Normal)   Pulse 66   Temp 97.9 F (36.6 C) (Temporal)   Ht 5\' 11"  (1.803 m)   Wt 156 lb 2 oz (70.8 kg)   SpO2 95%   BMI 21.78 kg/m   Wt Readings from Last 3 Encounters:  07/13/19 156 lb 2 oz (70.8 kg)  06/28/19 150 lb 5 oz (68.2 kg)  06/12/19 153 lb 6 oz (69.6 kg)      Physical Exam Vitals and nursing note reviewed.  Constitutional:      Appearance: Normal appearance. He is not ill-appearing.  Cardiovascular:     Rate and Rhythm: Normal rate and regular rhythm.     Pulses: Normal pulses.     Heart sounds: Normal heart sounds. No murmur.  Pulmonary:     Effort: Pulmonary effort is normal. No respiratory distress.     Breath sounds: Normal breath sounds. No wheezing, rhonchi or rales.  Musculoskeletal:     Right lower leg: No edema.     Left lower leg: No edema.  Neurological:     Mental Status: He is alert.  Psychiatric:        Mood and Affect: Mood normal.        Behavior: Behavior normal.       Results for orders placed or performed in visit on 07/13/19  POCT glycosylated hemoglobin (Hb A1C)  Result Value Ref Range   Hemoglobin A1C 7.6 (A) 4.0 - 5.6 %   HbA1c POC (<> result, manual entry)     HbA1c, POC (prediabetic range)     HbA1c, POC (controlled diabetic range)     Assessment & Plan:  This visit occurred during the SARS-CoV-2 public health emergency.  Safety protocols were in place, including screening questions prior to the visit, additional usage of staff PPE, and extensive cleaning of exam room while observing appropriate contact time as indicated for disinfecting solutions.   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 Recent EDC treatment.       Hypertension    BP remaining  elevated at home - will add lisinopril 5mg  nightly. Has onc labs scheduled in 2 wks.       Relevant Medications   lisinopril (ZESTRIL) 5 MG tablet   Gastritis with intestinal metaplasia of stomach    Pending H pylori serology.  To continue omeprazole 40mg  daily.       Controlled  diabetes mellitus with stage 3 chronic kidney disease, with long-term current use of insulin (HCC) - Primary    Chronic, some improvement. He endorses some low readings - see log. Many high readings recently as well. He is afraid to take humalog at times due to fear of hypoglycemia. He is not consistently using mealtime insulin. Will continue levemir 25u nightly, drop humalog to 3u with meals, 6u if CBG >250 at mealtime. RTC 3 mo f/u visit. Let me know sooner if low sugars continue.       Relevant Medications   lisinopril (ZESTRIL) 5 MG tablet   Other Relevant Orders   POCT glycosylated hemoglobin (Hb A1C) (Completed)   CKD (chronic kidney disease) stage 3, GFR 30-59 ml/min (HCC)    Add low dose lisinopril 5mg  nightly given elevated BP readings recently - goal <130/80 in CKD, if tolerated           Meds ordered this encounter  Medications  . lisinopril (ZESTRIL) 5 MG tablet    Sig: Take 1 tablet (5 mg total) by mouth at bedtime.    Dispense:  30 tablet    Refill:  6   Orders Placed This Encounter  Procedures  . POCT glycosylated hemoglobin (Hb A1C)    Patient Instructions  Blood pressures are staying too high - start lisinopril 5mg  at night. Continue other blood pressure medicines.  Sugars are doing a little better.  Check sugars before meal. Take humalog 3 units with meals but increase to 6 units if sugar is >250 Jot down on log whether sugar is before a meal or 2 hours after an meal.  Return in 3 months for follow up visit.    Follow up plan: Return in about 3 months (around 10/13/2019) for follow up visit.  [name], MD

## 2019-07-13 NOTE — Assessment & Plan Note (Addendum)
BP remaining elevated at home - will add lisinopril 5mg  nightly. Has onc labs scheduled in 2 wks.

## 2019-07-13 NOTE — Patient Instructions (Addendum)
Blood pressures are staying too high - start lisinopril 5mg  at night. Continue other blood pressure medicines.  Sugars are doing a little better.  Check sugars before meal. Take humalog 3 units with meals but increase to 6 units if sugar is >250 Jot down on log whether sugar is before a meal or 2 hours after an meal.  Return in 3 months for follow up visit.

## 2019-07-14 ENCOUNTER — Encounter: Payer: Self-pay | Admitting: Family Medicine

## 2019-07-16 NOTE — Telephone Encounter (Addendum)
Lvm asking pt to call back.  [See Dr. Synthia Innocent message]

## 2019-07-16 NOTE — Telephone Encounter (Signed)
Pt calling back.  Discussed issue with nipples.  Says it is both but denies any redness or discharge.  I gave him Dr. Synthia Innocent recommendations and to call and schedule OV if it doesn't help.  Pt verbalizes understanding.  FYI to Dr. Darnell Level.

## 2019-07-16 NOTE — Telephone Encounter (Signed)
plz call to triage - is this both nipples or just one?  If both and no discharge or redness, ok to try moisturizing cream like aveeno or eucerin twice daily, schedule OV if not better with this.

## 2019-07-21 ENCOUNTER — Encounter: Payer: Self-pay | Admitting: Family Medicine

## 2019-07-22 ENCOUNTER — Other Ambulatory Visit: Payer: Self-pay | Admitting: Gastroenterology

## 2019-07-22 ENCOUNTER — Telehealth: Payer: Self-pay

## 2019-07-22 NOTE — Telephone Encounter (Signed)
Telephone call to schedule palliative care visit.  Patient in agreement with RN making home visit 07/26/19 at 3:00 PM.

## 2019-07-24 NOTE — Progress Notes (Signed)
[place]  [phone] [phone]) [phone]  ID: [name] OB: 19-Sep-1940  MR#: [id number]  BHA#:[id number]  Patient Care Team: [name], MD as PCP - General (Family Medicine) Dorothy Spark, MD as PCP - Cardiology (Cardiology) Lloyd Huger, MD as Consulting Physician (Oncology) Clent Jacks, MD as Consulting Physician (Ophthalmology) Dorothy Spark, MD as Consulting Physician (Cardiology) Madelon Lips, MD as Consulting Physician (Nephrology)  CHIEF COMPLAINT: 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  INTERVAL HISTORY: Patient returns to clinic today for repeat laboratory work and further evaluation.  He continues to feel well and remains asymptomatic.  He does not complain of any weakness or fatigue. He denies any recent fevers or illnesses.  He has no neurologic complaints.  He has a good appetite and denies weight loss. He denies any night sweats. He has noted no lymphadenopathy.  He has no chest pain, shortness of breath, cough, or hemoptysis.  He denies any nausea, vomiting, constipation, or diarrhea.  He denies any melena or hematochezia.  He has no urinary complaints.  Patient offers no specific complaints today.  REVIEW OF SYSTEMS:   Review of Systems  Constitutional: Negative.  Negative for fever, malaise/fatigue and weight loss.  HENT: Negative.  Negative for nosebleeds.   Respiratory: Negative.  Negative for cough and shortness of breath.   Cardiovascular: Negative.  Negative for chest pain and leg swelling.  Gastrointestinal: Negative.  Negative for abdominal pain, blood in stool and melena.  Genitourinary: Negative.  Negative for dysuria and hematuria.  Musculoskeletal: Negative.  Negative for back pain and neck pain.  Skin: Negative.  Negative for itching and rash.  Neurological: Negative.  Negative for dizziness, sensory change, focal weakness, weakness and headaches.  Endo/Heme/Allergies: Negative.  Does  not bruise/bleed easily.  Psychiatric/Behavioral: Negative.  The patient is not nervous/anxious.     As per HPI. Otherwise, a complete review of systems is negative.  PAST MEDICAL HISTORY: Past Medical History:  Diagnosis Date  . Acquired hand deformity 1962   hand saw accident at work  . Anemia 10/11/2011  . Arthritis   . Bradycardia 10/10/2011  . CAD (coronary artery disease) 10/10/2011   MI s/p PTCA (Dx-OM2 proximal concentric stenosis)  . Carotid artery disease ([place])   . Cellulitis of buttock, left 11/17/2018  . Chronic edema   . CKD (chronic kidney disease) stage 3, GFR 30-59 ml/min    [name]  . CLL (chronic lymphocytic leukemia) ([place])   . Colon polyps   . Cough 04/28/2019  . Generalized headaches    frequent  . GI bleed 07/08/2017  . Glaucoma    s/p laser surgery  . HLD (hyperlipidemia)   . Hyperkalemia 02/28/2018  . Hypertension   . Hypothyroidism 10/10/2011  . Iron deficiency anemia due to chronic blood loss   . Squamous cell carcinoma of skin 06/15/2019   Right posterior ear. SCCis, hypertrophic, crusted  . Thrombocytopenia ([place]) 10/11/2011  . Type 2 diabetes with nephropathy [facility])    DM refresher course [facility] (04/2013)    PAST SURGICAL HISTORY: Past Surgical History:  Procedure Laterality Date  . BACK SURGERY     cervical neck  . CATARACT EXTRACTION, BILATERAL Bilateral 2017  . COLONOSCOPY  11/2008   1 polyp, diverticulosis, rec rpt 5 yrs (Dr. Oletta Lamas, [name])  . COLONOSCOPY  06/2014   hyperplastic polyp, rpt 5 yrs ([place])  . COLONOSCOPY WITH PROPOFOL N/A 11/17/2017   TA, HP, ([facility], Tally Due, MD)  . ESOPHAGOGASTRODUODENOSCOPY (EGD)  WITH PROPOFOL N/A 11/17/2017   healing erosive gastritis, intestinal metaplasia, neg H pylori ([facility], Tally Due, MD)  . ESOPHAGOGASTRODUODENOSCOPY (EGD) WITH PROPOFOL N/A 06/28/2019   erosive gastropathy with stigmata of recent bleed, granular tissue in esophagus biopsied- reflux esophagitis, small HH ([facility], Tally Due, MD)  .  EYE SURGERY  2012   laser surgery for glaucoma  . PORTACATH PLACEMENT Left 12/09/2018   Procedure: INSERTION PORT-A-CATH;  Surgeon: [name], MD;  Location: [facility] ORS;  Service: General;  Laterality: Left;  . PTCA  1994, 1995  . US ECHOCARDIOGRAPHY  10/2013   inferior wall hypokinesis, mild LVH, EF 50-55%, mild MR and LA dilation    FAMILY HISTORY: Family History  Problem Relation Age of Onset  . Diabetes Sister   . Stomach cancer Sister 53  . Pneumonia Father        caused death  . Other Brother        no communication with brother so unsure of any health conditions  . Coronary artery disease Son 52       5v CABG and stents  . Hyperlipidemia Sister   . Stroke Neg Hx   . Heart attack Neg Hx     ADVANCED DIRECTIVES (Y/N):  N  HEALTH MAINTENANCE: Social History   Tobacco Use  . Smoking status: Former Smoker    Quit date: 03/04/1978    Years since quitting: 41.4  . Smokeless tobacco: Never Used  Substance Use Topics  . Alcohol use: No  . Drug use: No     Colonoscopy:  PAP:  Bone density:  Lipid panel:  No Known Allergies  Current Outpatient Medications  Medication Sig Dispense Refill  . acetaminophen (TYLENOL) 650 MG CR tablet Take 1,300 mg by mouth 2 (two) times daily.     Marland Kitchen amLODipine (NORVASC) 5 MG tablet Take 1 tablet (5 mg total) by mouth daily. 90 tablet 3  . atorvastatin (LIPITOR) 80 MG tablet Take 1 tablet by mouth once daily 90 tablet 3  . carvedilol (COREG) 6.25 MG tablet Take 1 tablet (6.25 mg total) by mouth 2 (two) times daily with a meal. 180 tablet 3  . docusate sodium (COLACE) 100 MG capsule Take 100 mg by mouth daily as needed.     Marland Kitchen glucose blood (ONETOUCH ULTRA) test strip Use 1 strip to check sugars QID PRN (TID AC and HS and PRN) 300 each 6  . insulin aspart (NOVOLOG FLEXPEN) 100 UNIT/ML FlexPen Take 5 units with meals, take 10 units if mealtime sugar >250 (Patient taking differently: Inject 5-10 Units into the skin See admin instructions. Take  5 units with meals, take 10 units if mealtime sugar >250) 15 mL 11  . LEVEMIR FLEXTOUCH 100 UNIT/ML Pen INJECT 25 UNITS SUBCUTANEOUSLY ONCE DAILY AT  10PM 15 mL 2  . levothyroxine (SYNTHROID) 88 MCG tablet Take 1 tablet by mouth once daily 90 tablet 1  . lisinopril (ZESTRIL) 5 MG tablet Take 1 tablet (5 mg total) by mouth at bedtime. 30 tablet 6  . mupirocin ointment (BACTROBAN) 2 % Place 1 application into the nose 2 (two) times daily. Apply to wound once to twice daily. Keep wound covered 22 g 1  . nitroGLYCERIN (NITROSTAT) 0.4 MG SL tablet Place 1 tablet (0.4 mg total) under the tongue every 5 (five) minutes as needed for chest pain ((MAX of 3 doses)). 25 tablet 0  . omeprazole (PRILOSEC) 40 MG capsule TAKE 1 CAPSULE BY MOUTH ONCE DAILY BEFORE BREAKFAST 30 capsule 0  .  oxymetazoline (AFRIN) 0.05 % nasal spray Place 1 spray into left nostril daily as needed (nose bleeds).    . traZODone (DESYREL) 100 MG tablet TAKE 1 TABLET BY MOUTH AT BEDTIME AS NEEDED FOR SLEEP 90 tablet 0   No current facility-administered medications for this visit.   Facility-Administered Medications Ordered in Other Visits  Medication Dose Route Frequency Provider Last Rate Last Admin  . sodium chloride flush (NS) 0.9 % injection 10 mL  10 mL Intravenous Once Lloyd Huger, MD        OBJECTIVE: Vitals:   07/27/19 1121  BP: (!) 145/73  Pulse: 62  Resp: 20  Temp: (!) 97 F (36.1 C)  SpO2: 99%     Body mass index is 21.59 kg/m.    ECOG FS:0 - Asymptomatic  General: Well-developed, well-nourished, no acute distress. Eyes: Pink conjunctiva, anicteric sclera. HEENT: Normocephalic, moist mucous membranes. Lungs: No audible wheezing or coughing. Heart: Regular rate and rhythm. Abdomen: Soft, nontender, no obvious distention. Musculoskeletal: No edema, cyanosis, or clubbing. Neuro: Alert, answering all questions appropriately. Cranial nerves grossly intact. Skin: No rashes or petechiae noted. Psych: Normal  affect.  LAB RESULTS:  Lab Results  Component Value Date   NA 141 07/27/2019   K 4.6 07/27/2019   CL 105 07/27/2019   CO2 26 07/27/2019   GLUCOSE 232 (H) 07/27/2019   BUN 55 (H) 07/27/2019   CREATININE 1.95 (H) 07/27/2019   CALCIUM 9.8 07/27/2019   PROT 6.6 07/27/2019   ALBUMIN 4.3 07/27/2019   AST 24 07/27/2019   ALT 23 07/27/2019   ALKPHOS 80 07/27/2019   BILITOT 0.9 07/27/2019   GFRNONAA 32 (L) 07/27/2019   GFRAA 37 (L) 07/27/2019    Lab Results  Component Value Date   WBC 4.3 07/27/2019   NEUTROABS 3.6 07/27/2019   HGB 10.3 (L) 07/27/2019   HCT 31.2 (L) 07/27/2019   MCV 94.3 07/27/2019   PLT 125 (L) 07/27/2019     STUDIES: No results found.  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  PLAN:    1. CLL: Repeat bone marrow biopsy on Jul 28, 2018 reviewed independently with 73% involvement of CLL.  Previous bone marrow biopsy on October 23, 2017 revealed only 31% involvement.  Previously, peripheral blood FISH testing 50% incidence of mutation in the ATM gene which is commonly associated with deletion 11q. 11q- is associated with an unfavorable prognosis and high risk of not responding to initial treatment. 13q- is a more common mutation and is actually associated with a more favorable prognosis. Patient had clear progression of disease in his bone marrow along with a worsening transfusion requirement.  He received 5 weekly cycles of Rituxan with mild improvement of his platelet count.  Patient then received 4 cycles of Rituxan plus Treanda completing treatment on January 12, 2019.  Patient's white blood cell count is now within normal limits and his hemoglobin and platelets are decreased, but relatively stable.  No intervention is needed at this time.  Return to clinic in 3 months with repeat laboratory work and further evaluation.   2.  Thrombocytopenia: Chronic and unchanged.  Patient platelet count is 125 today.  Previously, he received high-dose  prednisone, IVIG, and Rituxan with no appreciable durability to improve his platelet count.  Platelets improved upon treatment of his CLL. 3.  Anemia: Chronic and unchanged.  Hemoglobin 10.3 today.  Patient's last blood transfusion was given on December 03, 2018.  Patient had colonoscopy on November 17, 2017 that removed 2 nonmalignant polyps.  EGD on the same day revealed nonbleeding erosive gastropathy with multiple nonbleeding duodenal ulcers.  Repeat EGD did not reveal significant pathology.  Continue follow-up with GI as scheduled. 4.  Reaction to Rituxan: Rate-based.  Patient will require premedications for any further infusions with Rituxan.  These have been entered into his treatment plan. 5.  Easy bruising: Resolved. 6.  Nosebleeds: Resolved.  Patient was previously given a referral back to ENT for further evaluation. 7.  Chronic renal insufficiency: Patient's creatinine is now 1.95.  Consider referral to nephrology in the near future.   Patient expressed understanding and was in agreement with this plan. He also understands that He can call clinic at any time with any questions, concerns, or complaints.    Lloyd Huger, MD   07/27/2019 11:41 AM

## 2019-07-26 ENCOUNTER — Other Ambulatory Visit: Payer: Medicare Other

## 2019-07-26 ENCOUNTER — Other Ambulatory Visit: Payer: Self-pay

## 2019-07-26 ENCOUNTER — Telehealth: Payer: Self-pay

## 2019-07-26 ENCOUNTER — Other Ambulatory Visit: Payer: Self-pay | Admitting: Emergency Medicine

## 2019-07-26 DIAGNOSIS — IMO0002 Reserved for concepts with insufficient information to code with codable children: Secondary | ICD-10-CM

## 2019-07-26 DIAGNOSIS — C9110 Chronic lymphocytic leukemia of B-cell type not having achieved remission: Secondary | ICD-10-CM

## 2019-07-26 DIAGNOSIS — Z515 Encounter for palliative care: Secondary | ICD-10-CM

## 2019-07-26 MED ORDER — ONETOUCH ULTRA VI STRP: ORAL_STRIP | 6 refills | Status: DC

## 2019-07-26 NOTE — Telephone Encounter (Signed)
Vergie RN with [facility] left v/m that pt needs refill on diabetic test strips; Vergie said that pt was advised by Dr Darnell Level to test BS 6 x a day. Per med list has ck BS 3 x a day. And on 07/13/19 office note had ck sugar before meal.  Pt runs out of strips and request new rx  With updated instructions sent to [facility] [address]. Vergie request cb when this is done so she can let pt know that test strips were sent in.Please advise.

## 2019-07-26 NOTE — Telephone Encounter (Signed)
Will send in higher # and see if insurance will cover. Reason - brittle diabetes with highs and lows.  I want him to check QID AC and HS and PRN

## 2019-07-26 NOTE — Progress Notes (Signed)
PATIENT NAME: Scott [name] DOB: 1940-06-04 MRN: [id number]  PRIMARY CARE PROVIDER: [name], MD  RESPONSIBLE PARTY:  Acct ID - Guarantor Home Phone Work Phone Relationship Acct Type  [id number] - [id number],[name] [phone]  Self P/F     [address], [place], [place] [address]    PLAN OF CARE and INTERVENTIONS:               1.  GOALS OF CARE/ ADVANCE CARE PLANNING:  Remain at home and remain independent for as long as possible.                2.  PATIENT/CAREGIVER EDUCATION:  Education on fall precautions,                3.  DISEASE STATUS:RN made scheduled palliative care home visit. Nurse met with patient who was outside talking with a friend when nurse arrived. Patient states he got up early this AM and is painting his deck. Patient alert and oriented. Patient talkative and pleasant today. Patient reports he sees Dr [name] [address] tomorrow. Patient has not seen Dr. Maryjane Hurter in 2 months. Patient reports he had six month follow-up with Dr. Danise Mina and "everything looked good." Patients current weight 156.2 lbs. Patient has recovered from fall from 06/11/19.  Patient reports his right eye orbital bone is still slightly sore. Patient states he is going to follow up with eye doctor. Patient reports he has decreased his insulin from 5 units to 3 units in the a.m. and decreased insulin from 25 to 15 at night. Patients blood sugar 128 this am. Patients vital signs are stable. Patient denies having any shortness of breath or cough. Patient reports his appetite is good. Patients breath sounds are clear. Patient has no edema in his lower extremities. Patient reports he has been sleeping good. Patient had skin cancer behind right ear that was removed, area is healing well. Patient requests nurse contact Dr Gutierrez's office to request RX be faxed to [facility] for blood strips as he is checking blood sugar 6 times per day.  Patient reports MD has requested he check blood sugar  6 times per day.   Nurse reviewed patient's medications. Patient was started on new medication for blood pressure (Lisinopril). Nurse reviewed medications with patient. Patient remains in agreement with palliative care services. Patient encouraged to contact palliative care with questions or concerns.      HISTORY OF PRESENT ILLNESS:  Patient is a 79 year old male who resides in his own home.  Patient is followed by palliative care and is seen monthly and PRN.   CODE STATUS: Full Code  ADVANCED DIRECTIVES: Y MOST FORM: No PPS: 60%   PHYSICAL EXAM:   VITALS: Today's Vitals   07/26/19 1505  Weight: 156 lb 3.2 oz (70.9 kg)  Height: '5\' 11"'  (1.803 m)  PainSc: 0-No pain    LUNGS: clear to auscultation  CARDIAC: Cor RRR  EXTREMITIES: Trace edema SKIN: Skin color, texture, turgor normal. No rashes or lesions  NEURO: Alert and oriented x 4       Nilda Simmer, RN

## 2019-07-27 ENCOUNTER — Encounter: Payer: Self-pay | Admitting: Oncology

## 2019-07-27 ENCOUNTER — Other Ambulatory Visit: Payer: Self-pay

## 2019-07-27 ENCOUNTER — Inpatient Hospital Stay (HOSPITAL_BASED_OUTPATIENT_CLINIC_OR_DEPARTMENT_OTHER): Payer: Medicare Other | Admitting: Oncology

## 2019-07-27 ENCOUNTER — Inpatient Hospital Stay: Payer: Medicare Other | Attending: Oncology

## 2019-07-27 VITALS — BP 145/73 | HR 62 | Temp 97.0°F | Resp 20 | Wt 154.8 lb

## 2019-07-27 DIAGNOSIS — D631 Anemia in chronic kidney disease: Secondary | ICD-10-CM | POA: Insufficient documentation

## 2019-07-27 DIAGNOSIS — D693 Immune thrombocytopenic purpura: Secondary | ICD-10-CM

## 2019-07-27 DIAGNOSIS — C9110 Chronic lymphocytic leukemia of B-cell type not having achieved remission: Secondary | ICD-10-CM

## 2019-07-27 DIAGNOSIS — Z87891 Personal history of nicotine dependence: Secondary | ICD-10-CM | POA: Insufficient documentation

## 2019-07-27 DIAGNOSIS — D649 Anemia, unspecified: Secondary | ICD-10-CM

## 2019-07-27 DIAGNOSIS — I129 Hypertensive chronic kidney disease with stage 1 through stage 4 chronic kidney disease, or unspecified chronic kidney disease: Secondary | ICD-10-CM | POA: Diagnosis not present

## 2019-07-27 DIAGNOSIS — E1122 Type 2 diabetes mellitus with diabetic chronic kidney disease: Secondary | ICD-10-CM | POA: Diagnosis not present

## 2019-07-27 LAB — COMPREHENSIVE METABOLIC PANEL
ALT: 23 U/L (ref 0–44)
AST: 24 U/L (ref 15–41)
Albumin: 4.3 g/dL (ref 3.5–5.0)
Alkaline Phosphatase: 80 U/L (ref 38–126)
Anion gap: 10 (ref 5–15)
BUN: 55 mg/dL — ABNORMAL HIGH (ref 8–23)
CO2: 26 mmol/L (ref 22–32)
Calcium: 9.8 mg/dL (ref 8.9–10.3)
Chloride: 105 mmol/L (ref 98–111)
Creatinine, Ser: 1.95 mg/dL — ABNORMAL HIGH (ref 0.61–1.24)
GFR calc Af Amer: 37 mL/min — ABNORMAL LOW (ref >60)
GFR calc non Af Amer: 32 mL/min — ABNORMAL LOW (ref >60)
Glucose, Bld: 232 mg/dL — ABNORMAL HIGH (ref 70–99)
Potassium: 4.6 mmol/L (ref 3.5–5.1)
Sodium: 141 mmol/L (ref 135–145)
Total Bilirubin: 0.9 mg/dL (ref 0.3–1.2)
Total Protein: 6.6 g/dL (ref 6.5–8.1)

## 2019-07-27 LAB — CBC WITH DIFFERENTIAL/PLATELET
Abs Immature Granulocytes: 0.01 10*3/uL (ref 0.00–0.07)
Basophils Absolute: 0 10*3/uL (ref 0.0–0.1)
Basophils Relative: 1 %
Eosinophils Absolute: 0.1 10*3/uL (ref 0.0–0.5)
Eosinophils Relative: 1 %
HCT: 31.2 % — ABNORMAL LOW (ref 39.0–52.0)
Hemoglobin: 10.3 g/dL — ABNORMAL LOW (ref 13.0–17.0)
Immature Granulocytes: 0 %
Lymphocytes Relative: 8 %
Lymphs Abs: 0.4 10*3/uL — ABNORMAL LOW (ref 0.7–4.0)
MCH: 31.1 pg (ref 26.0–34.0)
MCHC: 33 g/dL (ref 30.0–36.0)
MCV: 94.3 fL (ref 80.0–100.0)
Monocytes Absolute: 0.3 10*3/uL (ref 0.1–1.0)
Monocytes Relative: 7 %
Neutro Abs: 3.6 10*3/uL (ref 1.7–7.7)
Neutrophils Relative %: 83 %
Platelets: 125 10*3/uL — ABNORMAL LOW (ref 150–400)
RBC: 3.31 MIL/uL — ABNORMAL LOW (ref 4.22–5.81)
RDW: 13.2 % (ref 11.5–15.5)
WBC: 4.3 10*3/uL (ref 4.0–10.5)
nRBC: 0 % (ref 0.0–0.2)

## 2019-07-27 LAB — SAMPLE TO BLOOD BANK

## 2019-07-28 DIAGNOSIS — H02831 Dermatochalasis of right upper eyelid: Secondary | ICD-10-CM | POA: Diagnosis not present

## 2019-07-28 DIAGNOSIS — H401132 Primary open-angle glaucoma, bilateral, moderate stage: Secondary | ICD-10-CM | POA: Diagnosis not present

## 2019-07-28 DIAGNOSIS — H02834 Dermatochalasis of left upper eyelid: Secondary | ICD-10-CM | POA: Diagnosis not present

## 2019-07-28 DIAGNOSIS — E119 Type 2 diabetes mellitus without complications: Secondary | ICD-10-CM | POA: Diagnosis not present

## 2019-07-28 DIAGNOSIS — Z961 Presence of intraocular lens: Secondary | ICD-10-CM | POA: Diagnosis not present

## 2019-07-28 LAB — HM DIABETES EYE EXAM

## 2019-07-28 NOTE — Telephone Encounter (Signed)
Returned Vergie's call notifying her Dr. Darnell Level sent in a new rx with enough strips for new instructions.  She verbalizes understanding and will notify pt.

## 2019-07-30 ENCOUNTER — Encounter: Payer: Self-pay | Admitting: Family Medicine

## 2019-08-11 ENCOUNTER — Telehealth: Payer: Self-pay

## 2019-08-11 NOTE — Telephone Encounter (Signed)
Received faxed Request for Additional Documentation for Medical Necessity (Diabetes) from [facility].    Faxed request, OV notes and pt's BS log.

## 2019-08-11 NOTE — Telephone Encounter (Signed)
Patient came by office to leave a copy of his covid immunizations.  Copy's in rx tower.  Patient said he's been out of his test strips for several weeks.  I let him know the rx was sent to [facility] on 07/26/19 and it was confirmed and Darcus Pester was notifed.  Patient said he called [facility] this morning and they said they didn't have it.  Can you call [facility] to get this cleared up?

## 2019-08-11 NOTE — Telephone Encounter (Signed)
Updated pt's immunizations.    Spoke with [facility] [address] asking about rx.  Says they received it however, it requires Medicare's version of [place] for what they consider high utilization of test strips.  Informed him we received that form today and have faxed it back.  [facility] asks that we contact them when we get the approval so they can fill for pt.    Notified pt of above.  Verbalizes understanding.

## 2019-08-23 ENCOUNTER — Telehealth: Payer: Self-pay

## 2019-08-23 NOTE — Telephone Encounter (Signed)
Telephone call to schedule palliative care visit.  Patient in agreement with RN making home visit 08/24/19 at 3:30 PM.

## 2019-08-24 ENCOUNTER — Other Ambulatory Visit: Payer: Self-pay

## 2019-08-24 ENCOUNTER — Other Ambulatory Visit: Payer: Medicare Other

## 2019-08-24 NOTE — Telephone Encounter (Signed)
Lattie Haw CMA said she faxed info to [employer]	[place]. [place] notified as instructed. [place] wants to know if someone can come by and pick up form and other needed info such as office notes and pts BS log and take [facility] local [facility]. [place] request cb.

## 2019-08-24 NOTE — Progress Notes (Unsigned)
PATIENT NAME: Scott [name] DOB: 01-05-1941 MRN: [id number]  PRIMARY CARE PROVIDER: [name], MD  RESPONSIBLE PARTY:  Acct ID - Guarantor Home Phone Work Phone Relationship Acct Type  [id number] - [name],[name]  Self P/F     [place], [place], [place] [address]    PLAN OF CARE and INTERVENTIONS:               1.  GOALS OF CARE/ ADVANCE CARE PLANNING:  Remain at home and continue to function independently.               2.  PATIENT/CAREGIVER EDUCATION:  Education on fall precautions, education on s/s of infection, reviewed meds, support               3.  DISEASE STATUS: SW [place] and RN made scheduled palliative care home visit. Palliative care team met with patient and his grandson Scott [name] who was in home with patient today. Patient smiling and pleasant, alert and oriented x 4.  Patient denies having any pain at the present time. Patient reports he has not received his glucose testing strips. Nurse called [facility] to follow up on why strips have not been filled. [facility] states that Dr. Danise [name] office needs to fill out form for Medicare to pay for strips as MD wants patients blood sugars checked 6 times per day. SW Scott [name] called Dr  Scott [name] office and they state they have filled out paperwork and faxed to [employer].  MD's office in agreement with making copy of paperwork and SW to pick up and deliver to [facility] to expedite patient getting his blood sugar strips. Patients vital signs are stable other than heart rate remains slightly decreased. Patient reports he saw Dr Scott [name] 07/27/19 and received a good report. Patient will follow up again with Dr Scott [name] again in 3 months. Patient denies having any shortness of breath or cough. Patient reports his appetite is good. Patient's breath sounds are clear. Patient has no edema in his lower extremities. Patient has have no changes in current medications. Patient reports his current weight is 159 pounds. Nurse reviewed patient's  medications with patient. Patient enjoys spending time with his 2 dogs as well as his companion Scott [name]. Patient remains in agreement with palliative care services. Patient encouraged to contact palliative care with questions or concerns.      HISTORY OF PRESENT ILLNESS:  Patient is a 79 year old male who resides in his home.  Patient is followed y palliative care and is seen monthly and PRN.   CODE STATUS: Full Code  ADVANCED DIRECTIVES: Y MOST FORM: No PPS: 60%   PHYSICAL EXAM:   VITALS: Today's Vitals   08/24/19 1503  BP: 136/80  Pulse: (!) 55  Resp: 16  Temp: (!) 97.4 F (36.3 C)  TempSrc: Temporal  SpO2: 98%  Weight: 159 lb (72.1 kg)  PainSc: 0-No pain    LUNGS: clear to auscultation  CARDIAC: Cor Brady  EXTREMITIES: none edema SKIN: Skin color, texture, turgor normal. No rashes or lesions  NEURO: positive for weakness       Nilda Simmer, RN

## 2019-08-24 NOTE — Telephone Encounter (Signed)
[place] with [facility] said that [place] [facility] [address]. Had not received the faxed form for why pt needs additional diabetic test strips; pt has no test strips at this time. [place] request cb when resolved.

## 2019-08-24 NOTE — Telephone Encounter (Addendum)
Spoke with [place] notifying her the form and all requested info was faxed on 08/10/19.  I explained there is only a fax number on the info, no phn #.   Informed her I have contacted [place] to see if they had a phn # for corporate but they do not have anything.  Not sure what to do at this point.  Asia is requesting a copy of the form and info to take to [place].

## 2019-08-25 NOTE — Telephone Encounter (Signed)
[place] called back Requested a call back when you could

## 2019-08-25 NOTE — Telephone Encounter (Signed)
Lvm returning [place]'s call.  Asked her to call back.

## 2019-08-25 NOTE — Telephone Encounter (Signed)
[place] contacted the office and states that she has spoken with the medicare audit team - and they are getting referral taken care of and will be faxing paperwork to [place] as well as the pharmacy. FYI to [place].

## 2019-08-25 NOTE — Telephone Encounter (Signed)
Noted  

## 2019-08-25 NOTE — Telephone Encounter (Addendum)
Lvm for [place] asking her to call back.  Need to inform [place], due to HIPAA, I cannot give her a copy of pt's info.  However, if she can get another fax # or a phn # that would be help.

## 2019-08-25 NOTE — Telephone Encounter (Signed)
Received the same form we have been faxing to the [place] Team.  I faxed it to the fax # on form again.  Spoke with [place] relaying info above.  States she was given phn #  [phone] for [facility] Audit Team.  [place] spoke with them and was told they could see that our faxed info was received.  So the representative was sending a note to make sure it will be reviewed.  She will have the team send approval/denial by end of business day today.

## 2019-09-01 ENCOUNTER — Other Ambulatory Visit: Payer: Self-pay | Admitting: Gastroenterology

## 2019-09-07 ENCOUNTER — Other Ambulatory Visit: Payer: Self-pay

## 2019-09-07 ENCOUNTER — Encounter: Payer: Self-pay | Admitting: Family Medicine

## 2019-09-07 ENCOUNTER — Ambulatory Visit (INDEPENDENT_AMBULATORY_CARE_PROVIDER_SITE_OTHER): Payer: Medicare Other | Admitting: Family Medicine

## 2019-09-07 VITALS — BP 122/70 | HR 63 | Temp 96.5°F | Ht 71.0 in | Wt 151.6 lb

## 2019-09-07 DIAGNOSIS — R300 Dysuria: Secondary | ICD-10-CM | POA: Diagnosis not present

## 2019-09-07 DIAGNOSIS — E785 Hyperlipidemia, unspecified: Secondary | ICD-10-CM

## 2019-09-07 DIAGNOSIS — E1169 Type 2 diabetes mellitus with other specified complication: Secondary | ICD-10-CM | POA: Diagnosis not present

## 2019-09-07 LAB — POC URINALSYSI DIPSTICK (AUTOMATED)
Bilirubin, UA: NEGATIVE
Blood, UA: NEGATIVE
Glucose, UA: NEGATIVE
Ketones, UA: 5
Leukocytes, UA: NEGATIVE
Nitrite, UA: NEGATIVE
Protein, UA: POSITIVE — AB
Spec Grav, UA: 1.025 (ref 1.010–1.025)
Urobilinogen, UA: 0.2 E.U./dL
pH, UA: 5.5 (ref 5.0–8.0)

## 2019-09-07 MED ORDER — LEVEMIR FLEXTOUCH 100 UNIT/ML ~~LOC~~ SOPN: PEN_INJECTOR | SUBCUTANEOUS | Status: DC

## 2019-09-07 MED ORDER — TAMSULOSIN HCL 0.4 MG PO CAPS: 0.4000 mg | ORAL_CAPSULE | Freq: Every day | ORAL | 3 refills | Status: DC

## 2019-09-07 NOTE — Patient Instructions (Signed)
Hold atorvastatin for 1 week and see if the aches get better.  Either way, let us know.   Then try tamsulosin for urinary symptoms.  See if that helps.   Update [place] as needed.  Take care.  Glad to see you.

## 2019-09-07 NOTE — Progress Notes (Signed)
This visit occurred during the SARS-CoV-2 public health emergency.  Safety protocols were in place, including screening questions prior to the visit, additional usage of staff PPE, and extensive cleaning of exam room while observing appropriate contact time as indicated for disinfecting solutions.  He had cut his insulin back to 15 units of levemir at night given some prev lower sugars.  He is doing better on the lower dose and avoiding AM lows now.    He had some L flank pain, noted more at night.  Still sore at the time of the OV.  That is consistent, going on for months.  Pain can radiate to the R side of the back, but more often on the L side.  L flank is sore each AM upon waking.  Taking tylenol 2 tabs BID for pain, with some relief.    He has urinary urgency with some occ leakage.  Sx started in the last few months, gradually getting worse.  No burning with urination.  Stream is still okay.  No FCNAVD.    Meds, vitals, and allergies reviewed.   ROS: Per HPI unless specifically indicated in ROS section   GEN: nad, alert and oriented HEENT: NCAT NECK: supple w/o LA CV: rrr.  PULM: ctab, no inc wob ABD: soft, +bs, left lower back slightly tender to palpation EXT: no edema SKIN: Well-perfused.

## 2019-09-08 ENCOUNTER — Other Ambulatory Visit: Payer: Self-pay | Admitting: Family Medicine

## 2019-09-10 DIAGNOSIS — N3943 Post-void dribbling: Secondary | ICD-10-CM | POA: Insufficient documentation

## 2019-09-10 NOTE — Assessment & Plan Note (Signed)
He has been having chronic musculoskeletal pain and I question if some of this could be related to statin use.  Discussed holding his statin for 1 week to see if he improves and then updating [place].  He agrees.  At this point still okay for outpatient follow-up.  I do not expect him to have an ominous process or something urgent such as a kidney stone given his ongoing pain that is variable during the day and responsive to Tylenol.

## 2019-09-10 NOTE — Assessment & Plan Note (Signed)
Given his age, likely to have symptoms from BPH and reasonable to try Flomax with routine cautions and he will update [place] as needed.  He agrees with plan.

## 2019-09-14 ENCOUNTER — Telehealth: Payer: Self-pay

## 2019-09-14 NOTE — Telephone Encounter (Signed)
Telephone call to schedule palliative care visit.  Patient in agreement with RN making home visit 09/20/19 at 1:30 PM.

## 2019-09-15 ENCOUNTER — Encounter: Payer: Self-pay | Admitting: Family Medicine

## 2019-09-15 NOTE — Telephone Encounter (Signed)
Patient stopped by the office and stated the medication seems to work better during the day, back still hurts medication doesn't help at night. In the morning when he gets up patient says his back hurts real bad. Stated during the day pain eases off. He states the medication helps his legs more and does really good.

## 2019-09-17 NOTE — Telephone Encounter (Signed)
plz call and clarify - what med does he meant has helped?  He recently saw Dr Damita Dunnings for flank pain, held atorvastatin, stared on flomax for BPH symptoms. I don't see he was started on medication for back or leg pain?

## 2019-09-20 ENCOUNTER — Other Ambulatory Visit: Payer: Self-pay

## 2019-09-20 ENCOUNTER — Other Ambulatory Visit: Payer: Self-pay | Admitting: Family Medicine

## 2019-09-20 ENCOUNTER — Telehealth: Payer: Self-pay

## 2019-09-20 ENCOUNTER — Other Ambulatory Visit: Payer: Medicare Other

## 2019-09-20 DIAGNOSIS — IMO0002 Reserved for concepts with insufficient information to code with codable children: Secondary | ICD-10-CM

## 2019-09-20 DIAGNOSIS — E02 Subclinical iodine-deficiency hypothyroidism: Secondary | ICD-10-CM

## 2019-09-20 DIAGNOSIS — E1169 Type 2 diabetes mellitus with other specified complication: Secondary | ICD-10-CM

## 2019-09-20 NOTE — Telephone Encounter (Signed)
I received a call from Scott [name] with [facility]. She states the patient wanted Dr. Darnell Level to know that he has stopped taking the Lipitor and his muscle pain/aches are feeling a lot better. Patient is wondering if he should stay off of the Lipitor or if he should restart this?  Patient also needs a new prescription for his One Touch test strips. For his insurance to cover these test strips, the directions need to state that he is to check his sugars 3x daily. Dr. Darnell Level, is this ok to change directions for this?  Please advise   Scott [name] can be reached back at [phone]

## 2019-09-20 NOTE — Progress Notes (Signed)
PATIENT NAME: Scott [name] DOB: 1940/10/25 MRN: [id number]  PRIMARY CARE PROVIDER: [name], MD  RESPONSIBLE PARTY:  Acct ID - Guarantor Home Phone Work Phone Relationship Acct Type  [id number] - Denn[place],[name]  Self P/F     [place], [place], [place] [address]    PLAN OF CARE and INTERVENTIONS:               1.  GOALS OF CARE/ ADVANCE CARE PLANNING:  Remain in his home and remain independent.                2.  PATIENT/CAREGIVER EDUCATION:  Education on fall precautions, education on s/s of infection, reviewed meds, support               3.  DISEASE STATUS: RN made scheduled palliative care home visit. Patient agreed at nurse at door. Patient denies having pain at the present time. Patient reports he was having pain in his lower back as well as his legs. Patient reports he saw Dr. Damita Dunnings and Dr. Damita Dunnings discontinued his atorvastatin. Patient states he continues to have soreness in his lower back but reports pain subsides once he gets up and moves around. Patient states he takes Tylenol as needed for pain. Patient still has not received his glucose strips from Pharmacy. Nurse called [place].  Pharmacist states Medicare will only pay for strips for blood sugars to be checked 3 times per day.  Nurse called Dr Bosie Clos office and requested MD fax order for blood testing strips from 4 times to 3 times per day so patients strips would be covered. Patient scheduled to see Dr. [name] again next month. Patient not currently receiving any treatment for multiple myeloma.  Patients current weight is 152 pounds. Patients vital signs are stable. Patient reports he suffered a fall 2 weeks ago. Patient reports the back back of foot got stuck under recliner lift and caused him to fall in the floor. Patient states he did not suffer any injuries. Patient reports his appetite is good. Patient denies having any cough or shortness of breath. Patients breath sounds are clear. Patient has  no edema in his lower extremities. Nurse reviewed patient's medications with patient. Patient was given RX for Flomax but has not started medication.  Patient remains in agreement with palliative care services. Patient encouraged to contact palliative care with questions or concerns.      HISTORY OF PRESENT ILLNESS:  Patient is a 79 year old male who resides in his home.  Patient is followed by palliative care and is seen monthly and PRN.   CODE STATUS: Full Code  ADVANCED DIRECTIVES: Y MOST FORM: No PPS: 60%   PHYSICAL EXAM:   VITALS: Today's Vitals   09/20/19 1312  BP: 130/80  Pulse: (!) 57  Resp: 18  Temp: 97.9 F (36.6 C)  TempSrc: Temporal  SpO2: 98%  Weight: 152 lb (68.9 kg)  Height: '5\' 11"'  (1.803 m)  PainSc: 0-No pain    LUNGS: clear to auscultation  CARDIAC: Cor RRR and Cor Brady  EXTREMITIES: none edema SKIN: Skin color, texture, turgor normal. No rashes or lesions  NEURO: Alert and oriented / forgetful       Nilda Simmer, RN

## 2019-09-21 MED ORDER — ONETOUCH ULTRA VI STRP: 1.0000 | ORAL_STRIP | Freq: Three times a day (TID) | 3 refills | Status: DC

## 2019-09-21 MED ORDER — ONETOUCH ULTRA VI STRP: ORAL_STRIP | 3 refills | Status: DC

## 2019-09-21 NOTE — Telephone Encounter (Signed)
Lvm asking Scott [name] to call back.  Need to relay Dr. Synthia Innocent message.   E-scribed refill for test strips.

## 2019-09-21 NOTE — Addendum Note (Signed)
Addended by: [name] on: 09/21/2019 11:02 AM   Modules accepted: Orders

## 2019-09-21 NOTE — Telephone Encounter (Signed)
Would suggest he try lipitor 40mg  MWF dosing, update [place] with effect on muscle/body aches (previously was taking 80mg  daily).   Ok to send in One Touch strips with these directions.

## 2019-09-21 NOTE — Addendum Note (Signed)
Addended by: Brenton Grills on: 9/61/1643 53:91 PM   Modules accepted: Orders

## 2019-09-22 NOTE — Telephone Encounter (Signed)
Spoke with Virgie relaying Dr. Synthia Innocent message about Lipitor and notifying her the test strips with new directions was sent to [facility].  She verbalizes understanding and requests I call pt to inform him.  Left message on vm per dpr relaying Dr. Synthia Innocent message about the Liptor instructions.  Also, notified him rx for test strips with new directions sent to [facility].  Also, sent MyChart message.

## 2019-09-24 NOTE — Telephone Encounter (Signed)
Spoke with pt asking which medication was he referring to.   States since stopping the Lipitor, his leg pain has improved.  Says his back hurts when getting out of bed but gets better during the day.

## 2019-10-05 ENCOUNTER — Other Ambulatory Visit: Payer: Self-pay | Admitting: Gastroenterology

## 2019-10-13 ENCOUNTER — Other Ambulatory Visit: Payer: Self-pay

## 2019-10-13 ENCOUNTER — Ambulatory Visit (INDEPENDENT_AMBULATORY_CARE_PROVIDER_SITE_OTHER)
Admission: RE | Admit: 2019-10-13 | Discharge: 2019-10-13 | Disposition: A | Discharge status: Home / Self Care | Payer: Medicare Other | Source: Ambulatory Visit | Attending: Family Medicine | Admitting: Family Medicine

## 2019-10-13 ENCOUNTER — Encounter: Payer: Self-pay | Admitting: Family Medicine

## 2019-10-13 ENCOUNTER — Ambulatory Visit (INDEPENDENT_AMBULATORY_CARE_PROVIDER_SITE_OTHER): Payer: Medicare Other | Admitting: Family Medicine

## 2019-10-13 VITALS — BP 156/78 | HR 61 | Temp 97.7°F | Ht 71.0 in | Wt 152.2 lb

## 2019-10-13 DIAGNOSIS — E785 Hyperlipidemia, unspecified: Secondary | ICD-10-CM | POA: Diagnosis not present

## 2019-10-13 DIAGNOSIS — G8929 Other chronic pain: Secondary | ICD-10-CM

## 2019-10-13 DIAGNOSIS — N3943 Post-void dribbling: Secondary | ICD-10-CM | POA: Diagnosis not present

## 2019-10-13 DIAGNOSIS — E1122 Type 2 diabetes mellitus with diabetic chronic kidney disease: Secondary | ICD-10-CM

## 2019-10-13 DIAGNOSIS — E1169 Type 2 diabetes mellitus with other specified complication: Secondary | ICD-10-CM | POA: Diagnosis not present

## 2019-10-13 DIAGNOSIS — C9110 Chronic lymphocytic leukemia of B-cell type not having achieved remission: Secondary | ICD-10-CM

## 2019-10-13 DIAGNOSIS — N183 Chronic kidney disease, stage 3 unspecified: Secondary | ICD-10-CM

## 2019-10-13 DIAGNOSIS — M545 Low back pain, unspecified: Secondary | ICD-10-CM | POA: Insufficient documentation

## 2019-10-13 DIAGNOSIS — Z794 Long term (current) use of insulin: Secondary | ICD-10-CM

## 2019-10-13 DIAGNOSIS — I10 Essential (primary) hypertension: Secondary | ICD-10-CM

## 2019-10-13 LAB — POCT GLYCOSYLATED HEMOGLOBIN (HGB A1C): Hemoglobin A1C: 7.7 % — AB (ref 4.0–5.6)

## 2019-10-13 MED ORDER — FREESTYLE LIBRE 14 DAY SENSOR MISC: 3 refills | Status: DC

## 2019-10-13 MED ORDER — FREESTYLE LIBRE 14 DAY READER DEVI: 3 refills | Status: DC

## 2019-10-13 NOTE — Assessment & Plan Note (Signed)
Baseline lumbar films today.

## 2019-10-13 NOTE — Assessment & Plan Note (Signed)
Ongoing trouble despite dropping atorvastatin to 40mg  MWF. Consider further titration.

## 2019-10-13 NOTE — Patient Instructions (Addendum)
Keep log of sugars at home with my sugar log sheet and bring in 2 weeks.  Increase water intake - this will help sugar control and blood pressure control.  Lumbar xray today. Trial off flomax, monitor urinary symptoms.  We will send in continuous glucose meter to price out Return in 3 months for diabetes follow up visit.

## 2019-10-13 NOTE — Assessment & Plan Note (Signed)
Chronic, sugars fluctuate without obvious patterns. He is limited by insurance in how he can check sugars - despite taking insulin injections 4 times a day. Will send in freestyle libre continuous glucose monitor to see if will be covered. If not, will need to increase frequency of testing strip so he can safely check sugars. He went 2 months without strips due to difficulty getting them filled - this is dangerous.

## 2019-10-13 NOTE — Assessment & Plan Note (Signed)
This has improved on flomax - given low BP endorsed, rec trial off flomax to see if ongoing urinary trouble.

## 2019-10-13 NOTE — Assessment & Plan Note (Signed)
Appreciate onc care.  

## 2019-10-13 NOTE — Progress Notes (Signed)
This visit was conducted in person.  BP (!) 156/78 (BP Location: Right Arm, Patient Position: Sitting, Cuff Size: Normal)   Pulse 61   Temp 97.7 F (36.5 C) (Temporal)   Ht 5\' 11"  (1.803 m)   Wt 152 lb 3 oz (69 kg)   SpO2 98%   BMI 21.23 kg/m   Elevated on recheck. CC: DM f/u visit  Subjective:    Patient ID: Scott [name], male    DOB: Sep 12, 1940, 79 y.o.   MRN: [id number]  HPI: Scott [name] is a 79 y.o. male presenting on 10/13/2019 for Diabetes (Here for 3 mo f/u.  Provided recent BS readings. )   DM - does regularly check sugars and brings log: no real pattern to sugars - lows to 70-90 in am and night, highs to 250-300 in am and night. Compliant with antihyperglycemic regimen which includes: novolog 5u with meals, 10u if mealtime sugar >250 [actually taking 3-6 units with meals depending on sugars], levemir 15u qhs [actually taking 18 units nightly]. Denies low sugars or hypoglycemic symptoms. Denies paresthesias. Last diabetic eye exam 07/2019. Pneumovax: 2013. Prevnar: 2015. Glucometer brand: one touch. DSME: 2015, 2020 refresher courses. Continuous glucose monitor was unaffordable.  Lab Results  Component Value Date   HGBA1C 7.7 (A) 10/13/2019   Diabetic Foot Exam - Simple   No data filed     Lab Results  Component Value Date   MICROALBUR 29.0 (H) 02/22/2019     HTN - Compliant with current antihypertensive regimen of amlodipine 5mg  daily, carvedilol 6.25mg  bid, lisinopril 5mg  daily. Does check blood pressures at home and brings log: 115-160s/60-80s, 50-60s. Some lows to 90 systolic. Denies HA, vision changes, CP/tightness, SOB, leg swelling. Bought new BP cuff.   HLD - lipitor 80mg  daily caused body aches so we have tried 40mg  MWF dosing - myalgias continue worse upon awakening - describes lower back pain and stiffness when he first wakes up - hip soreness stays with him all day. Shoulders are fine. Ongoing knee pains. Has bought 3 different mattresses to help - to no  avail. Ongoing for 1 year  Saw Dr Damita Dunnings last month - started on flomax for some urinary leaking he was having - this has helped.    CLL seeing Dr [name] - progression of CLL with worsening transfusion requirement (thrombocytopenia and anemia).   Has established with [facility] home [facility].      Relevant past medical, surgical, family and social history reviewed and updated as indicated. Interim medical history since our last visit reviewed. Allergies and medications reviewed and updated. Outpatient Medications Prior to Visit  Medication Sig Dispense Refill  . acetaminophen (TYLENOL) 650 MG CR tablet Take 1,300 mg by mouth 2 (two) times daily.     Marland Kitchen amLODipine (NORVASC) 5 MG tablet Take 1 tablet (5 mg total) by mouth daily. 90 tablet 3  . atorvastatin (LIPITOR) 80 MG tablet Take 0.5 tablets (40 mg total) by mouth every Monday, Wednesday, and Friday.    . carvedilol (COREG) 6.25 MG tablet Take 1 tablet (6.25 mg total) by mouth 2 (two) times daily with a meal. 180 tablet 3  . docusate sodium (COLACE) 100 MG capsule Take 100 mg by mouth daily as needed.     Marland Kitchen glucose blood (ONETOUCH ULTRA) test strip Use as instructed to check blood sugar 3 (three) times a day. 300 each 3  . insulin aspart (NOVOLOG FLEXPEN) 100 UNIT/ML FlexPen Take 5 units with meals, take 10 units if  mealtime sugar >250 15 mL 11  . insulin detemir (LEVEMIR FLEXTOUCH) 100 UNIT/ML FlexPen INJECT 15 UNITS SUBCUTANEOUSLY ONCE DAILY AT  10PM    . levothyroxine (SYNTHROID) 88 MCG tablet Take 1 tablet by mouth once daily 90 tablet 1  . lisinopril (ZESTRIL) 5 MG tablet Take 1 tablet (5 mg total) by mouth at bedtime. 30 tablet 6  . nitroGLYCERIN (NITROSTAT) 0.4 MG SL tablet Place 1 tablet (0.4 mg total) under the tongue every 5 (five) minutes as needed for chest pain ((MAX of 3 doses)). 25 tablet 0  . omeprazole (PRILOSEC) 40 MG capsule TAKE 1 CAPSULE BY MOUTH ONCE DAILY BEFORE BREAKFAST 30 capsule 0  . oxymetazoline  (AFRIN) 0.05 % nasal spray Place 1 spray into left nostril daily as needed (nose bleeds).    . tamsulosin (FLOMAX) 0.4 MG CAPS capsule Take 1 capsule (0.4 mg total) by mouth daily. 30 capsule 3  . traZODone (DESYREL) 100 MG tablet TAKE 1 TABLET BY MOUTH AT BEDTIME AS NEEDED FOR SLEEP 90 tablet 0   Facility-Administered Medications Prior to Visit  Medication Dose Route Frequency Provider Last Rate Last Admin  . sodium chloride flush (NS) 0.9 % injection 10 mL  10 mL Intravenous Once [name], Kathlene November, MD         Per HPI unless specifically indicated in ROS section below Review of Systems Objective:  BP (!) 156/78 (BP Location: Right Arm, Patient Position: Sitting, Cuff Size: Normal)   Pulse 61   Temp 97.7 F (36.5 C) (Temporal)   Ht 5\' 11"  (1.803 m)   Wt 152 lb 3 oz (69 kg)   SpO2 98%   BMI 21.23 kg/m   Wt Readings from Last 3 Encounters:  10/13/19 152 lb 3 oz (69 kg)  09/20/19 152 lb (68.9 kg)  09/07/19 151 lb 9 oz (68.7 kg)      Physical Exam Vitals and nursing note reviewed.  Constitutional:      General: He is not in acute distress.    Appearance: Normal appearance. He is well-developed. He is not ill-appearing.  Eyes:     General: No scleral icterus.    Extraocular Movements: Extraocular movements intact.     Conjunctiva/sclera: Conjunctivae normal.     Pupils: Pupils are equal, round, and reactive to light.  Cardiovascular:     Rate and Rhythm: Normal rate and regular rhythm.     Pulses: Normal pulses.     Heart sounds: Normal heart sounds. No murmur heard.   Pulmonary:     Effort: Pulmonary effort is normal. No respiratory distress.     Breath sounds: Normal breath sounds. No wheezing, rhonchi or rales.  Musculoskeletal:     Cervical back: Normal range of motion and neck supple.     Right lower leg: No edema.     Left lower leg: No edema.     Comments: See HPI for foot exam if done  Lymphadenopathy:     Cervical: No cervical adenopathy.  Skin:    General:  Skin is warm and dry.     Findings: No rash.  Neurological:     Mental Status: He is alert.  Psychiatric:        Mood and Affect: Mood normal.        Behavior: Behavior normal.       Results for orders placed or performed in visit on 10/13/19  POCT glycosylated hemoglobin (Hb A1C)  Result Value Ref Range   Hemoglobin A1C 7.7 (A) 4.0 -  5.6 %   HbA1c POC (<> result, manual entry)     HbA1c, POC (prediabetic range)     HbA1c, POC (controlled diabetic range)     Lab Results  Component Value Date   CREATININE 1.95 (H) 07/27/2019   BUN 55 (H) 07/27/2019   NA 141 07/27/2019   K 4.6 07/27/2019   CL 105 07/27/2019   CO2 26 07/27/2019    Assessment & Plan:  This visit occurred during the SARS-CoV-2 public health emergency.  Safety protocols were in place, including screening questions prior to the visit, additional usage of staff PPE, and extensive cleaning of exam room while observing appropriate contact time as indicated for disinfecting solutions.   Problem List Items Addressed This Visit    Urinary dribbling    This has improved on flomax - given low BP endorsed, rec trial off flomax to see if ongoing urinary trouble.       Hypertension    BP elevated today despite recent addition of lisinopril to his regimen (along with carvedilol bid and amlodipine 5mg  daily). Widely fluctuating at home as well - as low as 90 systolic. Continue checking at home, no changes today. Discussed possible flomax contributing to lows - will stop and assess effect on urination.       Hyperlipidemia associated with type 2 diabetes mellitus ([place])    Ongoing trouble despite dropping atorvastatin to 40mg  MWF. Consider further titration.       Controlled diabetes mellitus with stage 3 chronic kidney disease, with long-term current use of insulin (HCC) - Primary    Chronic, sugars fluctuate without obvious patterns. He is limited by insurance in how he can check sugars - despite taking insulin injections 4  times a day. Will send in freestyle libre continuous glucose monitor to see if will be covered. If not, will need to increase frequency of testing strip so he can safely check sugars. He went 2 months without strips due to difficulty getting them filled - this is dangerous.       Relevant Orders   POCT glycosylated hemoglobin (Hb A1C) (Completed)   CLL (chronic lymphocytic leukemia) ([place])    Appreciate onc care.       Chronic midline low back pain without sciatica    Baseline lumbar films today.       Relevant Orders   DG Lumbar Spine Complete       Meds ordered this encounter  Medications  . Continuous Blood Gluc Sensor (FREESTYLE LIBRE 14 DAY SENSOR) MISC    Sig: Use as directed to check sugars QID AC HS    Dispense:  2 each    Refill:  3  . Continuous Blood Gluc Receiver (FREESTYLE LIBRE 14 DAY READER) DEVI    Sig: Use as directed to check sugars QID AC HS    Dispense:  2 each    Refill:  3   Orders Placed This Encounter  Procedures  . DG Lumbar Spine Complete    Standing Status:   Future    Number of Occurrences:   1    Standing Expiration Date:   10/12/2020    Order Specific Question:   Reason for Exam (SYMPTOM  OR DIAGNOSIS REQUIRED)    Answer:   chronic lower back pain    Order Specific Question:   Preferred imaging location?    Answer:   [facility]    Order Specific Question:   Radiology Contrast Protocol - do NOT remove file path  Answer:   [email or web address]  . POCT glycosylated hemoglobin (Hb A1C)    Patient Instructions  Keep log of sugars at home with my sugar log sheet and bring in 2 weeks.  Increase water intake - this will help sugar control and blood pressure control.  Lumbar xray today. Trial off flomax, monitor urinary symptoms.  We will send in continuous glucose meter to price out Return in 3 months for diabetes follow up visit.   Follow up plan: Return in about 3 months (around 01/13/2020),  or if symptoms worsen or fail to improve, for follow up visit.  [name], MD

## 2019-10-13 NOTE — Assessment & Plan Note (Addendum)
BP elevated today despite recent addition of lisinopril to his regimen (along with carvedilol bid and amlodipine 5mg  daily). Widely fluctuating at home as well - as low as 90 systolic. Continue checking at home, no changes today. Discussed possible flomax contributing to lows - will stop and assess effect on urination.

## 2019-10-20 ENCOUNTER — Other Ambulatory Visit: Payer: Self-pay | Admitting: Family Medicine

## 2019-10-21 NOTE — Progress Notes (Signed)
[facility] [facility]  [phone] [phone]  ID: Scott [name] OB: 05/14/1940  MR#: [id number]  KNL#:[id number]  Patient Care Team: [name], MD as PCP - General (Family Medicine) Dorothy Spark, MD as PCP - Cardiology (Cardiology) Lloyd Huger, MD as Consulting Physician (Oncology) Clent Jacks, MD as Consulting Physician (Ophthalmology) Dorothy Spark, MD as Consulting Physician (Cardiology) Madelon Lips, MD as Consulting Physician (Nephrology)  I connected with Scott [name] on 10/28/19 at 10:15 AM EDT by video enabled telemedicine visit and verified that I am speaking with the correct person using two identifiers.   I discussed the limitations, risks, security and privacy concerns of performing an evaluation and management service by telemedicine and the availability of in-person appointments. I also discussed with the patient that there may be a patient responsible charge related to this service. The patient expressed understanding and agreed to proceed.   Other persons participating in the visit and their role in the encounter: Patient, MD.  Patient's location: Clinic. Provider's location: Home.  CHIEF COMPLAINT: 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  INTERVAL HISTORY: Patient agreed to video assisted telemedicine visit for routine 22-monthevaluation.  He continues to feel well and remains asymptomatic.  He does not complain of any weakness or fatigue today. He denies any recent fevers or illnesses.  He has no neurologic complaints.  He has a good appetite and denies weight loss. He denies any night sweats. He has noted no lymphadenopathy.  He has no chest pain, shortness of breath, cough, or hemoptysis.  He denies any nausea, vomiting, constipation, or diarrhea.  He denies any melena or hematochezia.  He has no urinary complaints.  Patient feels at his baseline offers no specific complaints today.  REVIEW OF  SYSTEMS:   Review of Systems  Constitutional: Negative.  Negative for fever, malaise/fatigue and weight loss.  HENT: Negative.  Negative for nosebleeds.   Respiratory: Negative.  Negative for cough and shortness of breath.   Cardiovascular: Negative.  Negative for chest pain and leg swelling.  Gastrointestinal: Negative.  Negative for abdominal pain, blood in stool and melena.  Genitourinary: Negative.  Negative for dysuria and hematuria.  Musculoskeletal: Negative.  Negative for back pain and neck pain.  Skin: Negative.  Negative for itching and rash.  Neurological: Negative.  Negative for dizziness, sensory change, focal weakness, weakness and headaches.  Endo/Heme/Allergies: Negative.  Does not bruise/bleed easily.  Psychiatric/Behavioral: Negative.  The patient is not nervous/anxious.     As per HPI. Otherwise, a complete review of systems is negative.  PAST MEDICAL HISTORY: Past Medical History:  Diagnosis Date  . Acquired hand deformity 1962   hand saw accident at work  . Anemia 10/11/2011  . Arthritis   . Bradycardia 10/10/2011  . CAD (coronary artery disease) 10/10/2011   MI s/p PTCA (Dx-OM2 proximal concentric stenosis)  . Carotid artery disease ([place]   . Cellulitis of buttock, left 11/17/2018  . Chronic edema   . CKD (chronic kidney disease) stage 3, GFR 30-59 ml/min    [name]  . CLL ([place] lymphocytic [place]) [place]   . Colon polyps   . Cough 04/28/2019  . Generalized headaches    frequent  . GI bleed 07/08/2017  . Glaucoma    s/p laser surgery  . HLD (hyperlipidemia)   . Hyperkalemia 02/28/2018  . Hypertension   . Hypothyroidism 10/10/2011  . Iron deficiency anemia due to chronic blood loss   . Squamous cell carcinoma of  skin 06/15/2019   Right posterior ear. SCCis, hypertrophic, crusted  . Thrombocytopenia ([place]) 10/11/2011  . Type 2 diabetes with nephropathy [facility])    DM refresher course [facility] (04/2013)    PAST SURGICAL HISTORY: Past Surgical History:    Procedure Laterality Date  . BACK SURGERY     cervical neck  . CATARACT EXTRACTION, BILATERAL Bilateral 2017  . COLONOSCOPY  11/2008   1 polyp, diverticulosis, rec rpt 5 yrs (Dr. Oletta Lamas, [name])  . COLONOSCOPY  06/2014   hyperplastic polyp, rpt 5 yrs ([place])  . COLONOSCOPY WITH PROPOFOL N/A 11/17/2017   TA, HP, (Vanga, Tally Due, MD)  . ESOPHAGOGASTRODUODENOSCOPY (EGD) WITH PROPOFOL N/A 11/17/2017   healing erosive gastritis, intestinal metaplasia, neg H pylori (Vanga, Tally Due, MD)  . ESOPHAGOGASTRODUODENOSCOPY (EGD) WITH PROPOFOL N/A 06/28/2019   erosive gastropathy with stigmata of recent bleed, granular tissue in esophagus biopsied- reflux esophagitis, small HH (Vanga, Tally Due, MD)  . EYE SURGERY  2012   laser surgery for glaucoma  . PORTACATH PLACEMENT Left 12/09/2018   Procedure: INSERTION PORT-A-CATH;  Surgeon: [name], MD;  Location: [facility] ORS;  Service: General;  Laterality: Left;  . PTCA  1994, 1995  . US ECHOCARDIOGRAPHY  10/2013   inferior wall hypokinesis, mild LVH, EF 50-55%, mild MR and LA dilation    FAMILY HISTORY: Family History  Problem Relation Age of Onset  . Diabetes Sister   . Stomach cancer Sister 45  . Pneumonia Father        caused death  . Other Brother        no communication with brother so unsure of any health conditions  . Coronary artery disease Son 72       5v CABG and stents  . Hyperlipidemia Sister   . Stroke Neg Hx   . Heart attack Neg Hx     ADVANCED DIRECTIVES (Y/N):  N  HEALTH MAINTENANCE: Social History   Tobacco Use  . Smoking status: Former Smoker    Quit date: 03/04/1978    Years since quitting: 41.6  . Smokeless tobacco: Never Used  Vaping Use  . Vaping Use: Never used  Substance Use Topics  . Alcohol use: No  . Drug use: No     Colonoscopy:  PAP:  Bone density:  Lipid panel:  No Known Allergies  Current Outpatient Medications  Medication Sig Dispense Refill  . acetaminophen (TYLENOL) 650 MG CR  tablet Take 1,300 mg by mouth 2 (two) times daily.     Marland Kitchen amLODipine (NORVASC) 5 MG tablet Take 1 tablet (5 mg total) by mouth daily. 90 tablet 3  . atorvastatin (LIPITOR) 80 MG tablet Take 0.5 tablets (40 mg total) by mouth every Monday, Wednesday, and Friday.    . carvedilol (COREG) 6.25 MG tablet Take 1 tablet (6.25 mg total) by mouth 2 (two) times daily with a meal. 180 tablet 3  . Continuous Blood Gluc Receiver (FREESTYLE LIBRE 14 DAY READER) DEVI Use as directed to check sugars QID AC HS 2 each 3  . Continuous Blood Gluc Sensor (FREESTYLE LIBRE 14 DAY SENSOR) MISC Use as directed to check sugars QID AC HS 2 each 3  . docusate sodium (COLACE) 100 MG capsule Take 100 mg by mouth daily as needed.     Marland Kitchen glucose blood (ONETOUCH ULTRA) test strip Use as instructed to check blood sugar 3 (three) times a day. 300 each 3  . insulin detemir (LEVEMIR FLEXTOUCH) 100 UNIT/ML FlexPen INJECT 15 UNITS SUBCUTANEOUSLY ONCE DAILY  AT  10PM    . levothyroxine (SYNTHROID) 88 MCG tablet Take 1 tablet by mouth once daily 90 tablet 1  . lisinopril (ZESTRIL) 5 MG tablet Take 1 tablet (5 mg total) by mouth at bedtime. 30 tablet 6  . nitroGLYCERIN (NITROSTAT) 0.4 MG SL tablet Place 1 tablet (0.4 mg total) under the tongue every 5 (five) minutes as needed for chest pain ((MAX of 3 doses)). 25 tablet 0  . NOVOLOG FLEXPEN 100 UNIT/ML FlexPen INJECT 5 UNITS SUBCUTANEOUSLY WITH MEALS. INJECT 10 UNITS IF MEAL TIME SUGAR IS GREATER THAN 250. 15 mL 0  . omeprazole (PRILOSEC) 40 MG capsule TAKE 1 CAPSULE BY MOUTH ONCE DAILY BEFORE BREAKFAST 30 capsule 0  . oxymetazoline (AFRIN) 0.05 % nasal spray Place 1 spray into left nostril daily as needed (nose bleeds).    . tamsulosin (FLOMAX) 0.4 MG CAPS capsule Take 1 capsule (0.4 mg total) by mouth daily. 30 capsule 3  . traZODone (DESYREL) 100 MG tablet TAKE 1 TABLET BY MOUTH AT BEDTIME AS NEEDED FOR SLEEP 90 tablet 0   No current facility-administered medications for this visit.    Facility-Administered Medications Ordered in Other Visits  Medication Dose Route Frequency Provider Last Rate Last Admin  . sodium chloride flush (NS) 0.9 % injection 10 mL  10 mL Intravenous Once Lloyd Huger, MD        OBJECTIVE: Vitals:   10/28/19 1024  BP: (!) 150/66  Pulse: (!) 56  Temp: (!) 96.9 F (36.1 C)  SpO2: 100%     Body mass index is 21.51 kg/m.    ECOG FS:0 - Asymptomatic  General: Well-developed, well-nourished, no acute distress. HEENT: Normocephalic. Neuro: Alert, answering all questions appropriately. Cranial nerves grossly intact. Psych: Normal affect.  LAB RESULTS:  Lab Results  Component Value Date   NA 143 10/28/2019   K 5.6 (H) 10/28/2019   CL 106 10/28/2019   CO2 27 10/28/2019   GLUCOSE 181 (H) 10/28/2019   BUN 50 (H) 10/28/2019   CREATININE 1.75 (H) 10/28/2019   CALCIUM 9.8 10/28/2019   PROT 6.6 10/28/2019   ALBUMIN 4.3 10/28/2019   AST 25 10/28/2019   ALT 21 10/28/2019   ALKPHOS 81 10/28/2019   BILITOT 0.5 10/28/2019   GFRNONAA 36 (L) 10/28/2019   GFRAA 42 (L) 10/28/2019    Lab Results  Component Value Date   WBC 3.8 (L) 10/28/2019   NEUTROABS 2.8 10/28/2019   HGB 10.8 (L) 10/28/2019   HCT 33.0 (L) 10/28/2019   MCV 94.3 10/28/2019   PLT 118 (L) 10/28/2019     STUDIES: DG Lumbar Spine Complete  Result Date: 10/13/2019 CLINICAL DATA:  Chronic lower back pain EXAM: LUMBAR SPINE - COMPLETE 4+ VIEW COMPARISON:  None. FINDINGS: Five non rib bearing segments of the lumbar spine are identified. There is normal lumbar lordosis. Mild lumbar levoscoliosis, apex left at L3. There is minimal retrolisthesis of L2 upon L3 and minimal anterolisthesis of L4 upon L5, likely degenerative in nature. There is marked intervertebral disc space narrowing and endplate remodeling at Z7-6 and L2-3 in keeping with changes of severe degenerative disc disease. Milder degenerative changes are noted at L4-5. Oblique views demonstrate no evidence of pars  defect. Atherosclerotic calcifications seen within the abdominal aorta. The paraspinal soft tissues are otherwise unremarkable. IMPRESSION: 1. Advanced degenerative disc disease at L1-2 and L2-3. Moderate degenerative changes at L4-5. 2. Minimal retrolisthesis of L2 upon L3 and minimal anterolisthesis of L4 upon L5, likely degenerative in nature. Electronically  Signed   By: Fidela Salisbury MD   On: 10/13/2019 21:51    ASSESSMENT: CLL with ATM (11q-) mutation and 13q-, anemia, ITP.  PLAN:    1. CLL: Repeat bone marrow biopsy on Jul 28, 2018 reviewed independently with 73% involvement of CLL.  Previous bone marrow biopsy on October 23, 2017 revealed only 31% involvement.  Previously, peripheral blood FISH testing 50% incidence of mutation in the ATM gene which is commonly associated with deletion 11q. 11q- is associated with an unfavorable prognosis and high risk of not responding to initial treatment. 13q- is a more common mutation and is actually associated with a more favorable prognosis. Patient had clear progression of disease in his bone marrow along with a worsening transfusion requirement.  He received 5 weekly cycles of Rituxan with mild improvement of his platelet count.  Patient then received 4 cycles of Rituxan plus Treanda completing treatment on January 12, 2019.  Patient has a mild pancytopenia, but this is chronic and unchanged.  No intervention is needed at this time.  If patient required retreatment, would consider Rituxan plus Treanda once again.  Return to clinic in 3 months with repeat laboratory work and further evaluation.   2.  Thrombocytopenia: Chronic and unchanged.  Patient's platelet count is 118 today.  Previously, he received high-dose prednisone, IVIG, and Rituxan with no appreciable durability to improve his platelet count.  Platelets improved upon treatment of his CLL. 3.  Anemia: Chronic and unchanged.  Patient's hemoglobin has trended up slightly to 10.8.  His last blood  transfusion was given on December 03, 2018.  Patient had colonoscopy on November 17, 2017 that removed 2 nonmalignant polyps.  EGD on the same day revealed nonbleeding erosive gastropathy with multiple nonbleeding duodenal ulcers.  Repeat EGD did not reveal significant pathology.  Continue follow-up with GI as scheduled. 4.  Reaction to Rituxan: Rate-based.  Patient will require premedications for any further infusions with Rituxan.  These have been entered into his treatment plan. 5.  Easy bruising: Resolved. 6.  Nosebleeds: Resolved.   7.  Chronic renal insufficiency: Chronic and unchanged.  Consider referral to nephrology in the near future.  I provided 20 minutes of face-to-face video visit time during this encounter which included chart review, counseling, and coordination of care as documented above.   Patient expressed understanding and was in agreement with this plan. He also understands that He can call clinic at any time with any questions, concerns, or complaints.    Lloyd Huger, MD   10/28/2019 10:56 AM

## 2019-10-26 ENCOUNTER — Encounter: Payer: Self-pay | Admitting: Gastroenterology

## 2019-10-26 ENCOUNTER — Ambulatory Visit (INDEPENDENT_AMBULATORY_CARE_PROVIDER_SITE_OTHER): Payer: Medicare Other | Admitting: Gastroenterology

## 2019-10-26 ENCOUNTER — Other Ambulatory Visit: Payer: Self-pay

## 2019-10-26 VITALS — BP 131/63 | HR 65 | Temp 97.5°F | Ht 71.0 in | Wt 153.2 lb

## 2019-10-26 DIAGNOSIS — R1310 Dysphagia, unspecified: Secondary | ICD-10-CM

## 2019-10-26 DIAGNOSIS — K2970 Gastritis, unspecified, without bleeding: Secondary | ICD-10-CM | POA: Diagnosis not present

## 2019-10-26 DIAGNOSIS — K3189 Other diseases of stomach and duodenum: Secondary | ICD-10-CM | POA: Diagnosis not present

## 2019-10-26 DIAGNOSIS — R1319 Other dysphagia: Secondary | ICD-10-CM

## 2019-10-26 DIAGNOSIS — D649 Anemia, unspecified: Secondary | ICD-10-CM

## 2019-10-26 NOTE — Progress Notes (Signed)
[name], MD [address]  [place]  [place], [place] [address]  Main: [phone]  Fax: [phone]    Gastroenterology Consultation  Referring Provider:     [name], MD Primary Care Physician:  [name], MD Primary Gastroenterologist:  Dr. [name] Reason for Consultation:     Iron deficiency anemia        HPI:   Scott [name] is a 79 y.o. male referred by Dr. [name], MD  for consultation & management of and deficiency anemia. Patient was admitted to [facility] in 07/2017 with symptomatic anemia, hemoglobin 5.5, platelets of 8 with known history of CLL. His pancytopenia was thought to be secondary to CLL. Endoscopic evaluation was not performed during that admission because of severe Thrombocytopenia in the setting of CLL.patient received prednisone and IVIG as inpatient. Patient is closely followed by Dr. [name] at [place] and started on rituximab, received 2 infusions. His platelet count is 55 today, hemoglobin 10.4. He also has severe iron deficiency as well as borderline low B12 levels. He received parenteral iron therapy as well as oral B12. His ferritin levels improved as well as B12 levels. Patient is here to discuss about endoscopy evaluation. He denies abdominal pain, nausea, vomiting, rectal bleeding, melena, hematemesis, hematochezia. He stopped oral iron and B12  Follow-up visit 12/05/2017: Patient finished treatment for his CLL with rituximab.  His thrombocytopenia improved.  He underwent EGD and colonoscopy for iron deficiency anemia.  Found to have gastric erosions and duodenal ulcers.  There was no evidence of H. pylori.  Colonoscopy did not reveal evidence of iron deficiency anemia, terminal ileum normal.  Patient denies any bleeding episodes such as hematemesis, hematochezia, epistaxis, rectal bleeding.  He is being followed by Dr. [name] for his CLL.  He denies any GI symptoms.  He is not on  any acid suppression medication.  Follow-up visit 06/01/2019 Patient reports doing well from CLL standpoint.  He last chemo was 3 months ago.  He is here for follow-up of dysphagia and gastric intestinal metaplasia.  He reports that whenever he tries to eat any dry food, he feels stuck in his throat which goes down smoothly with water.  His last upper endoscopy did not reveal any structural lesions in the esophagus.  He thought he needed a colonoscopy per Dr. [name].  Patient is currently not on any PPI.  He denies any other GI symptoms.  He is no longer iron deficient.  His B12 levels are normal.  Follow-up visit 10/26/2019 Patient reports that he has intermittent difficulty swallowing, food getting stuck only to certain dry foods such as steak.  He tries to eat in small pieces and drink water, food eventually passes down.  He denies regurgitation.  He is in fact gaining weight.  He does not have any other concerns today  NSAIDs:  none  Antiplts/Anticoagulants/Anti thrombotics: none  GI Procedures: underwent colonoscopy in 2010, subcentimeter polyps were removed Underwent colonoscopy in 2016 in [place], 10 mm polyp from ascending colon was removed  EGD and colonoscopy 11/2017 - Multiple non-bleeding duodenal ulcers with a clean ulcer base (For[place] Class III). - Non-bleeding erosive gastropathy. - Non-bleeding erosive gastropathy. Biopsied. - Small hiatal hernia. - Normal gastroesophageal junction and esophagus.  - Two 5 to 7 mm polyps in the transverse colon and in the cecum, removed with a hot snare. Resected and retrieved. - Diverticulosis in the sigmoid colon, in the transverse colon and in the ascending  colon. - Increased mucosa vascular pattern in the rectum, prominent rectal veins - Normal mucosa in the entire examined colon.  DIAGNOSIS:  A. STOMACH, RANDOM; COLD BIOPSY:  - ANTRAL MUCOSA WITH REACTIVE AND HEALING EROSIVE GASTRITIS; FOCAL  INTESTINAL METAPLASIA IS PRESENT.  -  OXYNTIC MUCOSA WITH MODERATE CHRONIC GASTRITIS AND CHANGES CONSISTENT  WITH PROTON PUMP INHIBITOR EFFECT.  - IHC FOR HELICOBACTER IS NEGATIVE.  - NEGATIVE FOR DYSPLASIA AND MALIGNANCY.   B. COLON POLYP, CECUM; HOT SNARE:  - TUBULAR ADENOMA.  - NEGATIVE FOR HIGH-GRADE DYSPLASIA AND MALIGNANCY.   C. COLON POLYP, TRANSVERSE; HOT SNARE:  - HYPERPLASTIC POLYP.  - NEGATIVE FOR DYSPLASIA AND MALIGNANCY.   Upper endoscopy 06/28/2019 - Normal duodenal bulb and second portion of the duodenum. - Erosive gastropathy with stigmata of recent bleeding. Biopsied. - Normal gastric body. Biopsied. - A medium amount of food (residue) in the stomach. - Small hiatal hernia. - Granular mucosa in the esophagus. Biopsied. - Normal esophagus.  DIAGNOSIS:  A. STOMACH, INCISURA; COLD BIOPSY:  - ANTRAL-TYPE AND TRANSITIONAL-TYPE MUCOSA WITH CONGESTION AND MILD  REACTIVE FOVEOLAR HYPERPLASIA.  - NEGATIVE FOR H. PYLORI, INTESTINAL METAPLASIA, DYSPLASIA, AND  MALIGNANCY.   B. STOMACH, ANTRUM GREATER CURVATURE; COLD BIOPSY:  - OXYNTIC AND ANTRAL MUCOSA WITH MILD CHRONIC INFLAMMATION.  - NEGATIVE FOR H. PYLORI, INTESTINAL METAPLASIA, DYSPLASIA, AND  MALIGNANCY.   C. STOMACH, ANTRUM LESSER CURVATURE; COLD BIOPSY:  - OXYNTIC MUCOSA WITH EDEMA.  - NEGATIVE FOR H. PYLORI, INTESTINAL METAPLASIA, DYSPLASIA, AND  MALIGNANCY.   D. STOMACH, BODY GREATER CURVATURE; COLD BIOPSY:  - OXYNTIC MUCOSA WITH FOCAL INTESTINAL METAPLASIA INVOLVING 10% OF ONE  OF TWO FRAGMENTS, SEE COMMENT.  - NEGATIVE FOR H. PYLORI, DYSPLASIA, AND MALIGNANCY.   E. STOMACH, BODY LESSER CURVATURE; COLD BIOPSY:  - OXYNTIC MUCOSA WITH EDEMA.  - NEGATIVE FOR H. PYLORI, INTESTINAL METAPLASIA, DYSPLASIA, AND  MALIGNANCY.   F. GASTROESOPHAGEAL JUNCTION; COLD BIOPSY:  - SQUAMOUS-LINED MUCOSA WITH PARAKERATOSIS AND ACTIVE INFLAMMATION,  CONSISTENT WITH REFLUX ESOPHAGITIS.  - COLUMNAR-LINED MUCOSA WITH MILD CHRONIC INFLAMMATION AND  REACTIVE  FOVEOLAR HYPERPLASIA.  - NEGATIVE FOR INTESTINAL METAPLASIA (GOBLET CELLS), DYSPLASIA, AND  MALIGNANCY.   Past Medical History:  Diagnosis Date  . Acquired hand deformity 1962   hand saw accident at work  . Anemia 10/11/2011  . Arthritis   . Bradycardia 10/10/2011  . CAD (coronary artery disease) 10/10/2011   MI s/p PTCA (Dx-OM2 proximal concentric stenosis)  . Carotid artery disease ([place])   . Cellulitis of buttock, left 11/17/2018  . Chronic edema   . CKD (chronic kidney disease) stage 3, GFR 30-59 ml/min    [name]  . CLL (chronic lymphocytic leukemia) ([place])   . Colon polyps   . Cough 04/28/2019  . Generalized headaches    frequent  . GI bleed 07/08/2017  . Glaucoma    s/p laser surgery  . HLD (hyperlipidemia)   . Hyperkalemia 02/28/2018  . Hypertension   . Hypothyroidism 10/10/2011  . Iron deficiency anemia due to chronic blood loss   . Squamous cell carcinoma of skin 06/15/2019   Right posterior ear. SCCis, hypertrophic, crusted  . Thrombocytopenia ([place]) 10/11/2011  . Type 2 diabetes with nephropathy [facility])    DM refresher course [facility] (04/2013)    Past Surgical History:  Procedure Laterality Date  . BACK SURGERY     cervical neck  . CATARACT EXTRACTION, BILATERAL Bilateral 2017  . COLONOSCOPY  11/2008   1 polyp, diverticulosis, rec rpt 5 yrs (Dr. Oletta Lamas,  [facility])  . COLONOSCOPY  06/2014   hyperplastic polyp, rpt 5 yrs ([facility])  . COLONOSCOPY WITH PROPOFOL N/A 11/17/2017   TA, HP, (Alfhild Partch, Tally Due, MD)  . ESOPHAGOGASTRODUODENOSCOPY (EGD) WITH PROPOFOL N/A 11/17/2017   healing erosive gastritis, intestinal metaplasia, neg H pylori (Darlina Mccaughey, Tally Due, MD)  . ESOPHAGOGASTRODUODENOSCOPY (EGD) WITH PROPOFOL N/A 06/28/2019   erosive gastropathy with stigmata of recent bleed, granular tissue in esophagus biopsied- reflux esophagitis, small HH (Riti Rollyson, Tally Due, MD)  . EYE SURGERY  2012   laser surgery for glaucoma  . PORTACATH PLACEMENT Left 12/09/2018   Procedure:  INSERTION PORT-A-CATH;  Surgeon: [name], MD;  Location: [facility] ORS;  Service: General;  Laterality: Left;  . PTCA  1994, 1995  . US ECHOCARDIOGRAPHY  10/2013   inferior wall hypokinesis, mild LVH, EF 50-55%, mild MR and LA dilation    Current Outpatient Medications:  .  amLODipine (NORVASC) 5 MG tablet, Take 1 tablet (5 mg total) by mouth daily., Disp: 90 tablet, Rfl: 3 .  atorvastatin (LIPITOR) 80 MG tablet, Take 0.5 tablets (40 mg total) by mouth every Monday, Wednesday, and Friday., Disp: , Rfl:  .  carvedilol (COREG) 6.25 MG tablet, Take 1 tablet (6.25 mg total) by mouth 2 (two) times daily with a meal., Disp: 180 tablet, Rfl: 3 .  Continuous Blood Gluc Receiver (FREESTYLE LIBRE 14 DAY READER) DEVI, Use as directed to check sugars QID AC HS, Disp: 2 each, Rfl: 3 .  Continuous Blood Gluc Sensor (FREESTYLE LIBRE 14 DAY SENSOR) MISC, Use as directed to check sugars QID AC HS, Disp: 2 each, Rfl: 3 .  docusate sodium (COLACE) 100 MG capsule, Take 100 mg by mouth daily as needed. , Disp: , Rfl:  .  glucose blood (ONETOUCH ULTRA) test strip, Use as instructed to check blood sugar 3 (three) times a day., Disp: 300 each, Rfl: 3 .  insulin detemir (LEVEMIR FLEXTOUCH) 100 UNIT/ML FlexPen, INJECT 15 UNITS SUBCUTANEOUSLY ONCE DAILY AT  10PM, Disp: , Rfl:  .  levothyroxine (SYNTHROID) 88 MCG tablet, Take 1 tablet by mouth once daily, Disp: 90 tablet, Rfl: 1 .  lisinopril (ZESTRIL) 5 MG tablet, Take 1 tablet (5 mg total) by mouth at bedtime., Disp: 30 tablet, Rfl: 6 .  nitroGLYCERIN (NITROSTAT) 0.4 MG SL tablet, Place 1 tablet (0.4 mg total) under the tongue every 5 (five) minutes as needed for chest pain ((MAX of 3 doses))., Disp: 25 tablet, Rfl: 0 .  NOVOLOG FLEXPEN 100 UNIT/ML FlexPen, INJECT 5 UNITS SUBCUTANEOUSLY WITH MEALS. INJECT 10 UNITS IF MEAL TIME SUGAR IS GREATER THAN 250., Disp: 15 mL, Rfl: 0 .  omeprazole (PRILOSEC) 40 MG capsule, TAKE 1 CAPSULE BY MOUTH ONCE DAILY BEFORE BREAKFAST, Disp:  30 capsule, Rfl: 0 .  oxymetazoline (AFRIN) 0.05 % nasal spray, Place 1 spray into left nostril daily as needed (nose bleeds)., Disp: , Rfl:  .  tamsulosin (FLOMAX) 0.4 MG CAPS capsule, Take 1 capsule (0.4 mg total) by mouth daily., Disp: 30 capsule, Rfl: 3 .  traZODone (DESYREL) 100 MG tablet, TAKE 1 TABLET BY MOUTH AT BEDTIME AS NEEDED FOR SLEEP, Disp: 90 tablet, Rfl: 0 .  acetaminophen (TYLENOL) 650 MG CR tablet, Take 1,300 mg by mouth 2 (two) times daily.  (Patient not taking: Reported on 10/26/2019), Disp: , Rfl:  No current facility-administered medications for this visit.  Facility-Administered Medications Ordered in Other Visits:  .  sodium chloride flush (NS) 0.9 % injection 10 mL, 10 mL, Intravenous, Once, [name], [employer]  J, MD    Family History  Problem Relation Age of Onset  . Diabetes Sister   . Stomach cancer Sister 80  . Pneumonia Father        caused death  . Other Brother        no communication with brother so unsure of any health conditions  . Coronary artery disease Son 19       5v CABG and stents  . Hyperlipidemia Sister   . Stroke Neg Hx   . Heart attack Neg Hx      Social History   Tobacco Use  . Smoking status: Former Smoker    Quit date: 03/04/1978    Years since quitting: 41.6  . Smokeless tobacco: Never Used  Vaping Use  . Vaping Use: Never used  Substance Use Topics  . Alcohol use: No  . Drug use: No    Allergies as of 10/26/2019  . (No Known Allergies)    Review of Systems:    All systems reviewed and negative except where noted in HPI.   Physical Exam:  BP 131/63 (BP Location: Left Arm, Patient Position: Sitting, Cuff Size: Normal)   Pulse 65   Temp (!) 97.5 F (36.4 C) (Oral)   Ht 5\' 11"  (1.803 m)   Wt 153 lb 4 oz (69.5 kg)   BMI 21.37 kg/m  No LMP for male patient.  General:   Alert,  Well-developed, well-nourished, pleasant and cooperative in NAD Head:  Normocephalic and atraumatic. Eyes:  Sclera clear, no icterus.    Conjunctiva pink. Ears:  Normal auditory acuity, lesion behind the right auricle. Nose:  No deformity, discharge, or lesions. Mouth:  No deformity or lesions,oropharynx pink & moist. Neck:  Supple; no masses or thyromegaly. Lungs:  Respirations even and unlabored.  Clear throughout to auscultation.   No wheezes, crackles, or rhonchi. No acute distress. Heart:  Regular rate and rhythm; no murmurs, clicks, rubs, or gallops. Abdomen:  Normal bowel sounds. Soft, non-tender and non-distended without masses, hepatosplenomegaly or hernias noted.  No guarding or rebound tenderness.   Rectal: Not performed Msk:  Symmetrical without gross deformities. Good, equal movement & strength bilaterally. Pulses:  Normal pulses noted. Extremities:  No clubbing or edema.  No cyanosis. Neurologic:  Alert and oriented x3;  grossly normal neurologically. Skin: Dry scaly skin with maculopapular rash, petechiae no jaundice. Lymph Nodes:  No significant cervical adenopathy. Psych:  Alert and cooperative. Normal mood and affect.  Imaging Studies: No abdominal imaging  Assessment and Plan:   Scott [name] is a 79 y.o. Caucasian male with CLL with anemia and Thrombocytopenia, s/p rituximab, history of iron deficiency anemia, history of focal intestinal metaplasia of the stomach, duodenal ulcers, tubular adenoma of colon is seen for follow-up.   Patient is no longer iron deficient.  He does have mild normocytic anemia secondary to underlying CLL  Focal intestinal metaplasia in stomach: Only 1 fragment Check H. pylori IgG  Dysphagia: Intermittently to hard foods only, however, patient is gaining weight EGD revealed normal esophagus.  GE junction biopsies revealed reflux esophagitis. Will observe for 3 to 6 months, and if symptoms are progressive, l refer to esophageal manometry to evaluate for dysmotility disorder Continue omeprazole 40 mg daily before breakfast  Personal history of colon adenoma in 2019: Repeat  colonoscopy in 2024  Follow up in 3 to 4 months   [name], MD

## 2019-10-27 ENCOUNTER — Encounter: Payer: Self-pay | Admitting: Oncology

## 2019-10-27 LAB — H. PYLORI ANTIBODY, IGG: H. pylori, IgG AbS: 0.47 Index Value (ref 0.00–0.79)

## 2019-10-28 ENCOUNTER — Inpatient Hospital Stay: Payer: Medicare Other | Attending: Oncology

## 2019-10-28 ENCOUNTER — Other Ambulatory Visit: Payer: Self-pay

## 2019-10-28 ENCOUNTER — Inpatient Hospital Stay (HOSPITAL_BASED_OUTPATIENT_CLINIC_OR_DEPARTMENT_OTHER): Payer: Medicare Other | Admitting: Oncology

## 2019-10-28 VITALS — BP 150/66 | HR 56 | Temp 96.9°F | Wt 154.2 lb

## 2019-10-28 DIAGNOSIS — C9110 Chronic lymphocytic leukemia of B-cell type not having achieved remission: Secondary | ICD-10-CM | POA: Insufficient documentation

## 2019-10-28 DIAGNOSIS — D649 Anemia, unspecified: Secondary | ICD-10-CM

## 2019-10-28 LAB — CBC WITH DIFFERENTIAL/PLATELET
Abs Immature Granulocytes: 0.01 10*3/uL (ref 0.00–0.07)
Basophils Absolute: 0 10*3/uL (ref 0.0–0.1)
Basophils Relative: 1 %
Eosinophils Absolute: 0.1 10*3/uL (ref 0.0–0.5)
Eosinophils Relative: 2 %
HCT: 33 % — ABNORMAL LOW (ref 39.0–52.0)
Hemoglobin: 10.8 g/dL — ABNORMAL LOW (ref 13.0–17.0)
Immature Granulocytes: 0 %
Lymphocytes Relative: 13 %
Lymphs Abs: 0.5 10*3/uL — ABNORMAL LOW (ref 0.7–4.0)
MCH: 30.9 pg (ref 26.0–34.0)
MCHC: 32.7 g/dL (ref 30.0–36.0)
MCV: 94.3 fL (ref 80.0–100.0)
Monocytes Absolute: 0.4 10*3/uL (ref 0.1–1.0)
Monocytes Relative: 10 %
Neutro Abs: 2.8 10*3/uL (ref 1.7–7.7)
Neutrophils Relative %: 74 %
Platelets: 118 10*3/uL — ABNORMAL LOW (ref 150–400)
RBC: 3.5 MIL/uL — ABNORMAL LOW (ref 4.22–5.81)
RDW: 12.6 % (ref 11.5–15.5)
WBC: 3.8 10*3/uL — ABNORMAL LOW (ref 4.0–10.5)
nRBC: 0 % (ref 0.0–0.2)

## 2019-10-28 LAB — COMPREHENSIVE METABOLIC PANEL
ALT: 21 U/L (ref 0–44)
AST: 25 U/L (ref 15–41)
Albumin: 4.3 g/dL (ref 3.5–5.0)
Alkaline Phosphatase: 81 U/L (ref 38–126)
Anion gap: 10 (ref 5–15)
BUN: 50 mg/dL — ABNORMAL HIGH (ref 8–23)
CO2: 27 mmol/L (ref 22–32)
Calcium: 9.8 mg/dL (ref 8.9–10.3)
Chloride: 106 mmol/L (ref 98–111)
Creatinine, Ser: 1.75 mg/dL — ABNORMAL HIGH (ref 0.61–1.24)
GFR calc Af Amer: 42 mL/min — ABNORMAL LOW (ref >60)
GFR calc non Af Amer: 36 mL/min — ABNORMAL LOW (ref >60)
Glucose, Bld: 181 mg/dL — ABNORMAL HIGH (ref 70–99)
Potassium: 5.6 mmol/L — ABNORMAL HIGH (ref 3.5–5.1)
Sodium: 143 mmol/L (ref 135–145)
Total Bilirubin: 0.5 mg/dL (ref 0.3–1.2)
Total Protein: 6.6 g/dL (ref 6.5–8.1)

## 2019-10-29 LAB — SAMPLE TO BLOOD BANK

## 2019-11-02 ENCOUNTER — Other Ambulatory Visit: Payer: Self-pay

## 2019-11-02 ENCOUNTER — Ambulatory Visit (INDEPENDENT_AMBULATORY_CARE_PROVIDER_SITE_OTHER): Payer: Medicare Other | Admitting: Dermatology

## 2019-11-02 DIAGNOSIS — L578 Other skin changes due to chronic exposure to nonionizing radiation: Secondary | ICD-10-CM | POA: Diagnosis not present

## 2019-11-02 DIAGNOSIS — Z86007 Personal history of in-situ neoplasm of skin: Secondary | ICD-10-CM | POA: Diagnosis not present

## 2019-11-02 DIAGNOSIS — L570 Actinic keratosis: Secondary | ICD-10-CM | POA: Diagnosis not present

## 2019-11-02 NOTE — Progress Notes (Signed)
   Follow-Up Visit   Subjective  Scott [name] is a 79 y.o. male who presents for the following: Follow-up.  Patient here today to follow up on SCCis at right posterior ear treated with [facility] May 2021. Patient advises there is a spot at left ear that has been present for a few months with no symptoms. It may have been present at last visit. No other new or changing spots per patient.   The following portions of the chart were reviewed this encounter and updated as appropriate:      Review of Systems:  No other skin or systemic complaints except as noted in HPI or Assessment and Plan.  Objective  Well appearing patient in no apparent distress; mood and affect are within normal limits.  A focused examination was performed including face, ears. Relevant physical exam findings are noted in the Assessment and Plan.  Objective  Right Posterior Ear: Well healed scar with no evidence of recurrence.   Objective  left mid ear helix x 3, left ear antihelix x 1, right ear helix x 2, right ear antihelix x 1, nasal tip x 1 (8): Keratotic macules   Assessment & Plan  History of squamous cell carcinoma in situ (SCCIS) of skin Right Posterior Ear  S/p [facility] 5/21. Clear. Observe for recurrence. Call clinic for new or changing lesions.  Recommend regular skin exams, daily broad-spectrum spf 30+ sunscreen use, and photoprotection.      AK (actinic keratosis) (8) left mid ear helix x 3, left ear antihelix x 1, right ear helix x 2, right ear antihelix x 1, nasal tip x 1  Hypertrophic   Destruction of lesion - left mid ear helix x 3, left ear antihelix x 1, right ear helix x 2, right ear antihelix x 1, nasal tip x 1 Complexity: simple   Destruction method: cryotherapy   Informed consent: discussed and consent obtained   Lesion destroyed using liquid nitrogen: Yes   Region frozen until ice ball extended beyond lesion: Yes   Outcome: patient tolerated procedure well with no complications     Post-procedure details: wound care instructions given    Actinic Damage - diffuse scaly erythematous macules with underlying dyspigmentation - Recommend daily broad spectrum sunscreen SPF 30+ to sun-exposed areas, reapply every 2 hours as needed.  - Call for new or changing lesions.  Return in about 3 months (around 02/01/2020) for AK follow up, recheck SCCis site at right post ear.  Graciella Belton, RMA, am acting as scribe for Brendolyn Patty, MD . Documentation: I have reviewed the above documentation for accuracy and completeness, and I agree with the above.  Brendolyn Patty MD

## 2019-11-02 NOTE — Patient Instructions (Signed)

## 2019-11-19 ENCOUNTER — Other Ambulatory Visit: Payer: Self-pay | Admitting: Gastroenterology

## 2019-11-29 ENCOUNTER — Telehealth: Payer: Self-pay

## 2019-11-29 NOTE — Telephone Encounter (Signed)
156pm.  Phone call made to patient.  No answer but message has been left requesting a call back.  PLAN:  Awaiting call back.  If no call back,  I will call patient again in October.

## 2019-12-06 ENCOUNTER — Other Ambulatory Visit: Payer: Self-pay | Admitting: Family Medicine

## 2019-12-07 NOTE — Telephone Encounter (Signed)
Refill request Trazodone Last refill 09/09/19 #90 Last office visit 10/13/19

## 2019-12-10 DIAGNOSIS — Z23 Encounter for immunization: Secondary | ICD-10-CM | POA: Diagnosis not present

## 2019-12-15 ENCOUNTER — Other Ambulatory Visit: Payer: Medicare Other

## 2019-12-15 ENCOUNTER — Telehealth: Payer: Self-pay

## 2019-12-15 ENCOUNTER — Other Ambulatory Visit: Payer: Self-pay

## 2019-12-15 VITALS — BP 108/58 | HR 61 | Resp 22

## 2019-12-15 DIAGNOSIS — Z515 Encounter for palliative care: Secondary | ICD-10-CM

## 2019-12-15 NOTE — Progress Notes (Signed)
PATIENT NAME: Scott HUEBERT DOB: [birth year] MRN: [id number]  PRIMARY CARE PROVIDER: [name], MD  RESPONSIBLE PARTY:  Acct ID - Guarantor Home Phone Work Phone Relationship Acct Type  [id number] - Scott [name],[name]  Self P/F     [address], [place], [place] [address]    PLAN OF CARE and INTERVENTIONS:               1.  GOALS OF CARE/ ADVANCE CARE PLANNING:  Remain at home and independent.               2.  PATIENT/CAREGIVER EDUCATION:  Reviewed s/s of hypo/hyperglycemia and safety.               5.  DISEASE STATUS: Arrived at patient's home to find him outside with his 2 dogs.  Patient states he mowed the lawn this morning and enjoys being outside on nice days.  He often will watch television or surf the internet.  He has a shop in the backyard that he reports "piddling in".  Patient denies any pain.  No complaints of shortness of breath, chest pain or dizziness.  States he has not had to take nitroglycerin in a very long time.  Currently checking blood sugars 4 times a day.  Blood sugars are ranging from 89-330.  He currently has a [employer] that is monitoring blood sugars.  Patient continues with Levimer insulin from 13-15 units.  Education provided on signs and symptoms of hypo and hyperglycemia.  Patient states he knows when his blood sugar is low but has a harder time knowing when it is elevated.  He reports his appetite is well at this time and is eating 2-3 meals a day.  He has a companion that visits often and assists with meals.  No issues are reported with bowels and last BM was today.  Patient is taking Colace on a PRN basis.  No longer taking Flomax.  All other medications are being taking as directed.  Patient received his COVID-19 and flu vaccination recently.  Patient states he recently did a virtual visit with his oncologist and does not need any treatment at this time.   HISTORY OF PRESENT ILLNESS:  79 year old male with H/O of HTN, Type 2 Diabetes and  Idiopathic Thrombocytopenic Purpura.  Patient is being see by Palliative Care monthly and PRN.  CODE STATUS: Full Code  ADVANCED DIRECTIVES: N MOST FORM: No PPS: 60%   PHYSICAL EXAM:   VITALS: Today's Vitals   12/15/19 1447  BP: (!) 108/58  Pulse: 61  Resp: (!) 22  SpO2: 94%  PainSc: 0-No pain    LUNGS: Clear to auscultation. CARDIAC: HRR EXTREMITIES: No edema SKIN: Warm and dry to touch.  No rash or lesions reported by patient. NEURO:  Alert and oriented.  Forgetful at times.  Time: 125 pm-215 pm.       Lorenza Burton, RN

## 2019-12-15 NOTE — Telephone Encounter (Signed)
10 am.  Phone call made to patient to schedule a PC appointment.  Patient answered and is agreeable to a 130 pm appointment today.

## 2019-12-20 ENCOUNTER — Other Ambulatory Visit: Payer: Self-pay | Admitting: Family Medicine

## 2019-12-21 NOTE — Telephone Encounter (Signed)
Last OV 10/13/19 Last fill 09/07/19 Rx Damita Dunnings

## 2020-01-14 ENCOUNTER — Other Ambulatory Visit: Payer: Self-pay

## 2020-01-14 ENCOUNTER — Ambulatory Visit (INDEPENDENT_AMBULATORY_CARE_PROVIDER_SITE_OTHER): Payer: Medicare Other | Admitting: Family Medicine

## 2020-01-14 ENCOUNTER — Encounter: Payer: Self-pay | Admitting: Family Medicine

## 2020-01-14 VITALS — BP 136/70 | HR 64 | Temp 97.9°F | Ht 71.0 in | Wt 152.6 lb

## 2020-01-14 DIAGNOSIS — N183 Chronic kidney disease, stage 3 unspecified: Secondary | ICD-10-CM | POA: Diagnosis not present

## 2020-01-14 DIAGNOSIS — I10 Essential (primary) hypertension: Secondary | ICD-10-CM | POA: Diagnosis not present

## 2020-01-14 DIAGNOSIS — E1142 Type 2 diabetes mellitus with diabetic polyneuropathy: Secondary | ICD-10-CM | POA: Diagnosis not present

## 2020-01-14 DIAGNOSIS — I2510 Atherosclerotic heart disease of native coronary artery without angina pectoris: Secondary | ICD-10-CM | POA: Diagnosis not present

## 2020-01-14 DIAGNOSIS — C9110 Chronic lymphocytic leukemia of B-cell type not having achieved remission: Secondary | ICD-10-CM | POA: Diagnosis not present

## 2020-01-14 DIAGNOSIS — E1122 Type 2 diabetes mellitus with diabetic chronic kidney disease: Secondary | ICD-10-CM | POA: Diagnosis not present

## 2020-01-14 DIAGNOSIS — Z794 Long term (current) use of insulin: Secondary | ICD-10-CM | POA: Diagnosis not present

## 2020-01-14 LAB — POCT GLYCOSYLATED HEMOGLOBIN (HGB A1C): Hemoglobin A1C: 7.6 % — AB (ref 4.0–5.6)

## 2020-01-14 MED ORDER — LISINOPRIL 5 MG PO TABS
5.0000 mg | ORAL_TABLET | Freq: Every evening | ORAL | 3 refills | Status: DC
Start: 2020-01-14 — End: 2021-03-02

## 2020-01-14 MED ORDER — AMLODIPINE BESYLATE 5 MG PO TABS
5.0000 mg | ORAL_TABLET | Freq: Every day | ORAL | 3 refills | Status: DC
Start: 2020-01-14 — End: 2021-01-08

## 2020-01-14 MED ORDER — CARVEDILOL 6.25 MG PO TABS: 6.2500 mg | ORAL_TABLET | Freq: Two times a day (BID) | ORAL | 3 refills | Status: DC

## 2020-01-14 NOTE — Assessment & Plan Note (Signed)
H/o this.  ?

## 2020-01-14 NOTE — Patient Instructions (Addendum)
Your goal A1c is <8%.  Continue levemir 10 units nightly and novolog 3-5 units with meals.  We will refer you to diabetes doctor in [place] for evaluation.  Return in 3 months for medicare wellness visit.  Blood pressures are running ok. Continue current medicines.

## 2020-01-14 NOTE — Assessment & Plan Note (Signed)
Chronic. Persistent brittle diabetes with widely fluctuating levels leading to self tapering of insulin which he tends to do. He was started on insulin 9 yrs ago by prior PCP, off oral antihyperglycemics since then. He has completed DMSE courses in 2015 and again 2020. He started CGM (freestyle [place]) 2 months ago and is better able to check sugars which has led him to note hypoglycemia to 50s in the mornings so he has dropped his levemir dose down to 10u nightly (I don't see evidence of lows on his CGM readouts). In office today cbg 210 using CGM.  I will refer to endocrinology due to brittle diabetes for recommendations on optimal insulin dosing regimen.

## 2020-01-14 NOTE — Progress Notes (Signed)
Patient ID: Scott [name], male    DOB: 1940-09-01, 79 y.o.   MRN: [id number]  This visit was conducted in person.  BP 136/70 (BP Location: Left Arm, Patient Position: Sitting, Cuff Size: Normal)   Pulse 64   Temp 97.9 F (36.6 C) (Temporal)   Ht 5\' 11"  (1.803 m)   Wt 152 lb 9 oz (69.2 kg)   SpO2 96%   BMI 21.28 kg/m    CC: 3 mo DM f/u visit  Subjective:   HPI: Scott [name] is a 79 y.o. male presenting on 01/14/2020 for Diabetes (Here for 3 mo f/u.  Pt provided copy [to keep] of recent BP readings. )   He's received flu shot and covid booster - will let us know dates to update chart  DM - does regularly check sugars: brings CGM app with readings - sugar ranging 80s to 200s, lowest from 3am-12pm, . Compliant with antihyperglycemic regimen which includes: novolog 5u with meals, 10u if mealtime cbg >250 [he's actually taking 3-5 units with meals, notes 6 units will drop sugars too low], levemir 18u at night (he dropped to 10 units QHS due to am hypoglycemia). Occasional low sugars. Occasional burning at soles that improves when removing shoes. Last diabetic eye exam 07/2019. Pneumovax: 2013. Prevnar: 2015. Glucometer brand: one touch. Also has freestyle libre CGM ($70/month). DSME: 2015, 2020. Started using CGM 2-3 months ago. Last week he started walking on treadmill - notes this can drop sugars. Has been only on insulin for the past 9 years - prior on metformin until this lost effect.  Lab Results  Component Value Date   HGBA1C 7.6 (A) 01/14/2020   Diabetic Foot Exam - Simple   Simple Foot Form Diabetic Foot exam was performed with the following findings: Yes 01/14/2020  9:37 AM  Visual Inspection No deformities, no ulcerations, no other skin breakdown bilaterally: Yes Sensation Testing Intact to touch and monofilament testing bilaterally: Yes Pulse Check Posterior Tibialis and Dorsalis pulse intact bilaterally: Yes Comments 2+ DP bilaterally    Lab Results  Component  Value Date   MICROALBUR 29.0 (H) 02/22/2019     HTN - brings log of blood pressures showing average 140/80s - continues amlodipine 5mg  daily, lisinopril 5mg  daily, carvedilol 6.25mg  bid.  CLL followed by oncology.  Dysphagia - saw GI s/p reassuring EGD with reflux esophagitis. Continue omeprazole 40mg  daily, planned reassess in 3-6 months and if persistent consider manometry.      Relevant past medical, surgical, family and social history reviewed and updated as indicated. Interim medical history since our last visit reviewed. Allergies and medications reviewed and updated. Outpatient Medications Prior to Visit  Medication Sig Dispense Refill  . acetaminophen (TYLENOL) 650 MG CR tablet Take 1,300 mg by mouth 2 (two) times daily.     Marland Kitchen atorvastatin (LIPITOR) 80 MG tablet Take 0.5 tablets (40 mg total) by mouth every Monday, Wednesday, and Friday.    . Continuous Blood Gluc Receiver (FREESTYLE LIBRE 14 DAY READER) DEVI Use as directed to check sugars QID AC HS 2 each 3  . Continuous Blood Gluc Sensor (FREESTYLE LIBRE 14 DAY SENSOR) MISC Use as directed to check sugars QID AC HS 2 each 3  . insulin detemir (LEVEMIR FLEXTOUCH) 100 UNIT/ML FlexPen INJECT 25 UNITS SUBCUTANEOUSLY ONCE DAILY AT  10PM 15 mL 0  . levothyroxine (SYNTHROID) 88 MCG tablet Take 1 tablet by mouth once daily 90 tablet 1  . nitroGLYCERIN (NITROSTAT) 0.4 MG SL tablet Place  1 tablet (0.4 mg total) under the tongue every 5 (five) minutes as needed for chest pain ((MAX of 3 doses)). 25 tablet 0  . NOVOLOG FLEXPEN 100 UNIT/ML FlexPen INJECT 5 UNITS SUBCUTANEOUSLY WITH MEALS. INJECT 10 UNITS IF MEAL TIME SUGAR IS GREATER THAN 250. 15 mL 0  . oxymetazoline (AFRIN) 0.05 % nasal spray Place 1 spray into left nostril daily as needed (nose bleeds).    . traZODone (DESYREL) 100 MG tablet TAKE 1 TABLET BY MOUTH AT BEDTIME AS NEEDED FOR SLEEP 90 tablet 0  . amLODipine (NORVASC) 5 MG tablet Take 1 tablet (5 mg total) by mouth daily. 90  tablet 3  . carvedilol (COREG) 6.25 MG tablet Take 1 tablet (6.25 mg total) by mouth 2 (two) times daily with a meal. 180 tablet 3  . lisinopril (ZESTRIL) 5 MG tablet Take 1 tablet (5 mg total) by mouth at bedtime. 30 tablet 6  . docusate sodium (COLACE) 100 MG capsule Take 100 mg by mouth daily as needed.     Marland Kitchen glucose blood (ONETOUCH ULTRA) test strip Use as instructed to check blood sugar 3 (three) times a day. 300 each 3  . omeprazole (PRILOSEC) 40 MG capsule TAKE 1 CAPSULE BY MOUTH ONCE DAILY BEFORE BREAKFAST 30 capsule 3  . tamsulosin (FLOMAX) 0.4 MG CAPS capsule Take 1 capsule (0.4 mg total) by mouth daily. 30 capsule 3   Facility-Administered Medications Prior to Visit  Medication Dose Route Frequency Provider Last Rate Last Admin  . sodium chloride flush (NS) 0.9 % injection 10 mL  10 mL Intravenous Once [name], Kathlene November, MD         Per HPI unless specifically indicated in ROS section below Review of Systems Objective:  BP 136/70 (BP Location: Left Arm, Patient Position: Sitting, Cuff Size: Normal)   Pulse 64   Temp 97.9 F (36.6 C) (Temporal)   Ht 5\' 11"  (1.803 m)   Wt 152 lb 9 oz (69.2 kg)   SpO2 96%   BMI 21.28 kg/m   Wt Readings from Last 3 Encounters:  01/14/20 152 lb 9 oz (69.2 kg)  10/28/19 154 lb 3.2 oz (69.9 kg)  10/26/19 153 lb 4 oz (69.5 kg)      Physical Exam Vitals and nursing note reviewed.  Constitutional:      General: He is not in acute distress.    Appearance: Normal appearance. He is well-developed. He is not ill-appearing.  HENT:     Head: Normocephalic and atraumatic.  Eyes:     General: No scleral icterus.    Extraocular Movements: Extraocular movements intact.     Conjunctiva/sclera: Conjunctivae normal.     Pupils: Pupils are equal, round, and reactive to light.  Cardiovascular:     Rate and Rhythm: Normal rate and regular rhythm.     Heart sounds: Normal heart sounds. No murmur heard.   Pulmonary:     Effort: Pulmonary effort is  normal. No respiratory distress.     Breath sounds: Normal breath sounds. No wheezing or rales.  Musculoskeletal:     Cervical back: Normal range of motion and neck supple.     Comments: See HPI for foot exam if done  Lymphadenopathy:     Cervical: No cervical adenopathy.  Skin:    General: Skin is warm and dry.     Findings: No rash.  Neurological:     Mental Status: He is alert.       Results for orders placed or performed in  visit on 01/14/20  POCT glycosylated hemoglobin (Hb A1C)  Result Value Ref Range   Hemoglobin A1C 7.6 (A) 4.0 - 5.6 %   HbA1c POC (<> result, manual entry)     HbA1c, POC (prediabetic range)     HbA1c, POC (controlled diabetic range)     Lab Results  Component Value Date   CREATININE 1.75 (H) 10/28/2019   BUN 50 (H) 10/28/2019   NA 143 10/28/2019   K 5.6 (H) 10/28/2019   CL 106 10/28/2019   CO2 27 10/28/2019    Assessment & Plan:  This visit occurred during the SARS-CoV-2 public health emergency.  Safety protocols were in place, including screening questions prior to the visit, additional usage of staff PPE, and extensive cleaning of exam room while observing appropriate contact time as indicated for disinfecting solutions.   Problem List Items Addressed This Visit    Hypertension    Chronic, stable. flomax stopped last visit - concerned it was contributing to fluctuating blood pressures. BP averaging 140s/80s at home, this is likely adequate given age/comorbidities although would want tighter control based on CKD.       Relevant Medications   carvedilol (COREG) 6.25 MG tablet   amLODipine (NORVASC) 5 MG tablet   lisinopril (ZESTRIL) 5 MG tablet   Diabetic neuropathy (HCC)    H/o this.       Relevant Medications   lisinopril (ZESTRIL) 5 MG tablet   Controlled diabetes mellitus with stage 3 chronic kidney disease, with long-term current use of insulin (HCC) - Primary    Chronic. Persistent brittle diabetes with widely fluctuating levels  leading to self tapering of insulin which he tends to do. He was started on insulin 9 yrs ago by prior PCP, off oral antihyperglycemics since then. He has completed DMSE courses in 2015 and again 2020. He started CGM (freestyle [place]) 2 months ago and is better able to check sugars which has led him to note hypoglycemia to 50s in the mornings so he has dropped his levemir dose down to 10u nightly (I don't see evidence of lows on his CGM readouts). In office today cbg 210 using CGM.  I will refer to endocrinology due to brittle diabetes for recommendations on optimal insulin dosing regimen.       Relevant Medications   lisinopril (ZESTRIL) 5 MG tablet   Other Relevant Orders   POCT glycosylated hemoglobin (Hb A1C) (Completed)   Ambulatory referral to Endocrinology   CLL (chronic lymphocytic leukemia) ([place])    Appreciate onc care [name])      CAD (coronary artery disease)    Want to avoid hypoglycemia.       Relevant Medications   carvedilol (COREG) 6.25 MG tablet   amLODipine (NORVASC) 5 MG tablet   lisinopril (ZESTRIL) 5 MG tablet   Other Relevant Orders   Ambulatory referral to Endocrinology       Meds ordered this encounter  Medications  . carvedilol (COREG) 6.25 MG tablet    Sig: Take 1 tablet (6.25 mg total) by mouth 2 (two) times daily with a meal.    Dispense:  180 tablet    Refill:  3  . amLODipine (NORVASC) 5 MG tablet    Sig: Take 1 tablet (5 mg total) by mouth daily.    Dispense:  90 tablet    Refill:  3  . lisinopril (ZESTRIL) 5 MG tablet    Sig: Take 1 tablet (5 mg total) by mouth at bedtime.    Dispense:  90 tablet    Refill:  3   Orders Placed This Encounter  Procedures  . Ambulatory referral to Endocrinology    Referral Priority:   Routine    Referral Type:   Consultation    Referral Reason:   Specialty Services Required    Number of Visits Requested:   1  . POCT glycosylated hemoglobin (Hb A1C)    Patient Instructions  Your goal A1c is <8%.    Continue levemir 10 units nightly and novolog 3-5 units with meals.  We will refer you to diabetes doctor in [place] for evaluation.  Return in 3 months for medicare wellness visit.  Blood pressures are running ok. Continue current medicines.   Follow up plan: Return in about 3 months (around 04/15/2020) for medicare wellness visit.  [name], MD

## 2020-01-14 NOTE — Assessment & Plan Note (Signed)
Chronic, stable. flomax stopped last visit - concerned it was contributing to fluctuating blood pressures. BP averaging 140s/80s at home, this is likely adequate given age/comorbidities although would want tighter control based on CKD.

## 2020-01-14 NOTE — Assessment & Plan Note (Signed)
Appreciate onc care Scott [name])

## 2020-01-14 NOTE — Assessment & Plan Note (Signed)
Want to avoid hypoglycemia.

## 2020-01-17 ENCOUNTER — Telehealth: Payer: Self-pay

## 2020-01-17 NOTE — Telephone Encounter (Signed)
The other continuous glucose monitor to price out was DexCom G6 monitor.

## 2020-01-17 NOTE — Telephone Encounter (Signed)
Patient came in today to drop off copies of his covid booster shot along with updated flu shot records but [place] states he was here the other day and was suggested to try a new meter to check sugar levels, he hs forgotten what the name of it was and is requesting a phone call in return.

## 2020-01-17 NOTE — Telephone Encounter (Signed)
Spoke with pt relaying Dr. Synthia Innocent message.  Pt verbalizes understanding and will check on cost.   Looking for pt's info to update immunizations.  Was not in rx tower.

## 2020-01-18 ENCOUNTER — Telehealth: Payer: Self-pay

## 2020-01-18 NOTE — Telephone Encounter (Signed)
10:05AM: Palliative care SW outreached patient/family for monthly telephonic visit.  SW left HIPPA complaint VM. Awaiting return call.  Will continue to offer palliative care support.

## 2020-01-18 NOTE — Telephone Encounter (Signed)
Received copies of immunizations.  Updated pt's chart.

## 2020-01-26 NOTE — Progress Notes (Signed)
Scott [name]  [phone] [phone]) [phone]  ID: [name] OB: 17-Mar-1940  MR#: [id number]  XBM#:[id number]  Patient Care Team: [name], MD as PCP - General (Family Medicine) Dorothy Spark, MD as PCP - Cardiology (Cardiology) Lloyd Huger, MD as Consulting Physician (Oncology) Clent Jacks, MD as Consulting Physician (Ophthalmology) Dorothy Spark, MD as Consulting Physician (Cardiology) Madelon Lips, MD as Consulting Physician (Nephrology)   CHIEF COMPLAINT: 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  INTERVAL HISTORY: Patient returns to clinic today for repeat laboratory can routine 22-monthevaluation.  He continues to feel well and remains asymptomatic.  He denies any weakness or fatigue.  He denies any recent fevers or illnesses.  He has no neurologic complaints.  He has a good appetite and denies weight loss. He denies any night sweats. He has noted no lymphadenopathy.  He has no chest pain, shortness of breath, cough, or hemoptysis.  He denies any nausea, vomiting, constipation, or diarrhea.  He denies any melena or hematochezia.  He has no urinary complaints.  Patient offers no specific complaints today.  REVIEW OF SYSTEMS:   Review of Systems  Constitutional: Negative.  Negative for fever, malaise/fatigue and weight loss.  HENT: Negative.  Negative for nosebleeds.   Respiratory: Negative.  Negative for cough and shortness of breath.   Cardiovascular: Negative.  Negative for chest pain and leg swelling.  Gastrointestinal: Negative.  Negative for abdominal pain, blood in stool and melena.  Genitourinary: Negative.  Negative for dysuria and hematuria.  Musculoskeletal: Negative.  Negative for back pain and neck pain.  Skin: Negative.  Negative for itching and rash.  Neurological: Negative.  Negative for dizziness, sensory change, focal weakness, weakness and headaches.  Endo/Heme/Allergies: Negative.  Does not  bruise/bleed easily.  Psychiatric/Behavioral: Negative.  The patient is not nervous/anxious.     As per HPI. Otherwise, a complete review of systems is negative.  PAST MEDICAL HISTORY: Past Medical History:  Diagnosis Date  . Acquired hand deformity 1962   hand saw accident at work  . Anemia 10/11/2011  . Arthritis   . Bradycardia 10/10/2011  . CAD (coronary artery disease) 10/10/2011   MI s/p PTCA (Dx-OM2 proximal concentric stenosis)  . Carotid artery disease ([place]   . Cellulitis of buttock, left 11/17/2018  . Chronic edema   . CKD (chronic kidney disease) stage 3, GFR 30-59 ml/min (HCC)    [name]  . CLL ([place]ocytic leukemia) ([place]   . Colon polyps   . Cough 04/28/2019  . Generalized headaches    frequent  . GI bleed 07/08/2017  . Glaucoma    s/p laser surgery  . HLD (hyperlipidemia)   . Hyperkalemia 02/28/2018  . Hypertension   . Hypothyroidism 10/10/2011  . Iron deficiency anemia due to chronic blood loss   . Squamous cell carcinoma of skin 06/15/2019   Right posterior ear. SCCis, hypertrophic, crusted  . [place]rombocytopenia ([place] 10/11/2011  . Type 2 diabetes with nephropathy ([facility]    DM refresher course [facility] (04/2013)    PAST SURGICAL HISTORY: Past Surgical History:  Procedure Laterality Date  . BACK SURGERY     cervical neck  . CATARACT EXTRACTION, BILATERAL Bilateral 2017  . COLONOSCOPY  11/2008   1 polyp, diverticulosis, rec rpt 5 yrs (Dr. EOletta Lamas ESadie Haber  . COLONOSCOPY  06/2014   hyperplastic polyp, rpt 5 yrs ([place])  . COLONOSCOPY WITH PROPOFOL N/A 11/17/2017   TA, HP, (Vanga, RTally Due MD)  . ESOPHAGOGASTRODUODENOSCOPY (EGD)  WITH PROPOFOL N/A 11/17/2017   healing erosive gastritis, intestinal metaplasia, neg H pylori (Vanga, Tally Due, MD)  . ESOPHAGOGASTRODUODENOSCOPY (EGD) WITH PROPOFOL N/A 06/28/2019   erosive gastropathy with stigmata of recent bleed, granular tissue in esophagus biopsied- reflux esophagitis, small HH (Vanga, Tally Due, MD)  .  EYE SURGERY  2012   laser surgery for glaucoma  . PORTACATH PLACEMENT Left 12/09/2018   Procedure: INSERTION PORT-A-CATH;  Surgeon: [name], MD;  Location: [facility] ORS;  Service: General;  Laterality: Left;  . PTCA  1994, 1995  . US ECHOCARDIOGRAPHY  10/2013   inferior wall hypokinesis, mild LVH, EF 50-55%, mild MR and LA dilation    FAMILY HISTORY: Family History  Problem Relation Age of Onset  . Diabetes Sister   . Stomach cancer Sister 23  . Pneumonia Father        caused death  . Other Brother        no communication with brother so unsure of any health conditions  . Coronary artery disease Son 52       5v CABG and stents  . Hyperlipidemia Sister   . Stroke Neg Hx   . Heart attack Neg Hx     ADVANCED DIRECTIVES (Y/N):  N  HEALTH MAINTENANCE: Social History   Tobacco Use  . Smoking status: Former Smoker    Quit date: 03/04/1978    Years since quitting: 41.9  . Smokeless tobacco: Never Used  Vaping Use  . Vaping Use: Never used  Substance Use Topics  . Alcohol use: No  . Drug use: No     Colonoscopy:  PAP:  Bone density:  Lipid panel:  No Known Allergies  Current Outpatient Medications  Medication Sig Dispense Refill  . acetaminophen (TYLENOL) 650 MG CR tablet Take 1,300 mg by mouth 2 (two) times daily.     Marland Kitchen amLODipine (NORVASC) 5 MG tablet Take 1 tablet (5 mg total) by mouth daily. 90 tablet 3  . atorvastatin (LIPITOR) 80 MG tablet Take 0.5 tablets (40 mg total) by mouth every Monday, Wednesday, and Friday.    . carvedilol (COREG) 6.25 MG tablet Take 1 tablet (6.25 mg total) by mouth 2 (two) times daily with a meal. 180 tablet 3  . Continuous Blood Gluc Receiver (FREESTYLE LIBRE 14 DAY READER) DEVI Use as directed to check sugars QID AC HS 2 each 3  . Continuous Blood Gluc Sensor (FREESTYLE LIBRE 14 DAY SENSOR) MISC USE AS DIRECTED TO  CHECK  SUGARS  FOUR  TIMES  DAILY  BEFORE  MEALS  AND  AT  BEDTIME 2 each 3  . insulin detemir (LEVEMIR FLEXTOUCH) 100  UNIT/ML FlexPen INJECT 25 UNITS SUBCUTANEOUSLY ONCE DAILY AT  10PM 15 mL 0  . levothyroxine (SYNTHROID) 88 MCG tablet Take 1 tablet by mouth once daily 90 tablet 1  . lisinopril (ZESTRIL) 5 MG tablet Take 1 tablet (5 mg total) by mouth at bedtime. 90 tablet 3  . nitroGLYCERIN (NITROSTAT) 0.4 MG SL tablet Place 1 tablet (0.4 mg total) under the tongue every 5 (five) minutes as needed for chest pain ((MAX of 3 doses)). 25 tablet 0  . NOVOLOG FLEXPEN 100 UNIT/ML FlexPen INJECT 5 UNITS SUBCUTANEOUSLY WITH MEALS. INJECT 10 UNITS IF MEAL TIME SUGAR IS GREATER THAN 250. 15 mL 0  . oxymetazoline (AFRIN) 0.05 % nasal spray Place 1 spray into left nostril daily as needed (nose bleeds).    . traZODone (DESYREL) 100 MG tablet TAKE 1 TABLET BY MOUTH AT BEDTIME  AS NEEDED FOR SLEEP 90 tablet 0   No current facility-administered medications for this visit.   Facility-Administered Medications Ordered in Other Visits  Medication Dose Route Frequency Provider Last Rate Last Admin  . sodium chloride flush (NS) 0.9 % injection 10 mL  10 mL Intravenous Once Lloyd Huger, MD        OBJECTIVE: Vitals:   02/03/20 1053  BP: (!) 154/68  Pulse: (!) 58  Temp: (!) 97.1 F (36.2 C)  SpO2: 100%     Body mass index is 21.65 kg/m.    ECOG FS:0 - Asymptomatic  General: Well-developed, well-nourished, no acute distress. Eyes: Pink conjunctiva, anicteric sclera. HEENT: Normocephalic, moist mucous membranes. Lungs: No audible wheezing or coughing. Heart: Regular rate and rhythm. Abdomen: Soft, nontender, no obvious distention. Musculoskeletal: No edema, cyanosis, or clubbing. Neuro: Alert, answering all questions appropriately. Cranial nerves grossly intact. Skin: No rashes or petechiae noted. Psych: Normal affect.   LAB RESULTS:  Lab Results  Component Value Date   NA 142 02/03/2020   K 5.0 02/03/2020   CL 106 02/03/2020   CO2 26 02/03/2020   GLUCOSE 179 (H) 02/03/2020   BUN 43 (H) 02/03/2020    CREATININE 1.53 (H) 02/03/2020   CALCIUM 9.7 02/03/2020   PROT 6.6 02/03/2020   ALBUMIN 4.1 02/03/2020   AST 21 02/03/2020   ALT 21 02/03/2020   ALKPHOS 79 02/03/2020   BILITOT 0.6 02/03/2020   GFRNONAA 46 (L) 02/03/2020   GFRAA 42 (L) 10/28/2019    Lab Results  Component Value Date   WBC 3.9 (L) 02/03/2020   NEUTROABS 2.7 02/03/2020   HGB 11.0 (L) 02/03/2020   HCT 33.9 (L) 02/03/2020   MCV 96.3 02/03/2020   PLT 117 (L) 02/03/2020     STUDIES: No results found.  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  PLAN:    1. CLL: Repeat bone marrow biopsy on Jul 28, 2018 reviewed independently with 73% involvement of CLL.  Previous bone marrow biopsy on October 23, 2017 revealed only 31% involvement.  Previously, peripheral blood FISH testing 50% incidence of mutation in the ATM gene which is commonly associated with deletion 11q. 11q- is associated with an unfavorable prognosis and high risk of not responding to initial treatment. 13q- is a more common mutation and is actually associated with a more favorable prognosis. Patient had clear progression of disease in his bone marrow along with a worsening transfusion requirement.  He received 5 weekly cycles of Rituxan with mild improvement of his platelet count.  Patient then received 4 cycles of Rituxan plus Treanda completing treatment on January 12, 2019.  Patient continues to have a mild pancytopenia, but this is chronic and unchanged.  No intervention is needed at this time. If patient required retreatment, would consider Rituxan plus Treanda once again.  Return to clinic in 3 months with repeat laboratory work and further evaluation.   2.  Thrombocytopenia: Chronic and unchanged.  Patient's platelet count is 117 today.  Previously, he received high-dose prednisone, IVIG, and Rituxan with no appreciable durability to improve his platelet count.  Platelets improved upon treatment of his CLL. 3.  Anemia: Chronic and  unchanged.  Patient's hemoglobin is 11.0 today.  His last blood transfusion was given on December 03, 2018.  Patient had colonoscopy on November 17, 2017 that removed 2 nonmalignant polyps.  EGD on the same day revealed nonbleeding erosive gastropathy with multiple nonbleeding duodenal ulcers.  Repeat EGD did not reveal  significant pathology.  Continue follow-up with GI as scheduled. 4.  Reaction to Rituxan: Rate-based.  Patient will require premedications for any further infusions with Rituxan.  These have been entered into his treatment plan. 5.  Easy bruising: Resolved. 6.  Nosebleeds: Resolved.   7.  Chronic renal insufficiency: Chronic and unchanged.  Consider referral to nephrology in the near future.   Patient expressed understanding and was in agreement with this plan. He also understands that He can call clinic at any time with any questions, concerns, or complaints.    Lloyd Huger, MD   02/04/2020 9:53 AM

## 2020-02-01 ENCOUNTER — Ambulatory Visit (INDEPENDENT_AMBULATORY_CARE_PROVIDER_SITE_OTHER): Payer: Medicare Other | Admitting: Dermatology

## 2020-02-01 ENCOUNTER — Other Ambulatory Visit: Payer: Self-pay

## 2020-02-01 DIAGNOSIS — L570 Actinic keratosis: Secondary | ICD-10-CM | POA: Diagnosis not present

## 2020-02-01 DIAGNOSIS — Z86007 Personal history of in-situ neoplasm of skin: Secondary | ICD-10-CM | POA: Diagnosis not present

## 2020-02-01 DIAGNOSIS — I2510 Atherosclerotic heart disease of native coronary artery without angina pectoris: Secondary | ICD-10-CM

## 2020-02-01 NOTE — Progress Notes (Signed)
   Follow-Up Visit   Subjective  Scott [name] is a 79 y.o. male who presents for the following: Follow-up (AK f/u for left mid ear helix x 3, left ear antihelix x 1, right ear helix x 2, right ear antihelix x 1, nasal tip x 1. Also rechecking SCCIS site on right posterior ear. ).   The following portions of the chart were reviewed this encounter and updated as appropriate:     Review of Systems: No other skin or systemic complaints except as noted in HPI or Assessment and Plan.   Objective  Well appearing patient in no apparent distress; mood and affect are within normal limits.  A focused examination was performed including face and ears. Relevant physical exam findings are noted in the Assessment and Plan.  Objective  Left Upper Ear Helix x 3, residual r ear helix x 3  , right posterior ear helix x 1, right antihelix x 1 (8): Erythematous thin papules/macules with gritty scale.   Objective  Right posterior Ear: Well healed scar with no evidence of recurrence.   Assessment & Plan  AK (actinic keratosis) (8) Left Upper Ear Helix x 3, residual r ear helix x 3  , right posterior ear helix x 1, right antihelix x 1  Prior to procedure, discussed risks of blister formation, small wound, skin dyspigmentation, or rare scar following cryotherapy.    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 Observe for recurrence. Call clinic for new or changing lesions.  Recommend regular skin exams, daily  broad-spectrum spf 30+ sunscreen use, and photoprotection.     Return in about 6 months (around 07/31/2020) for UBSE.   I, Harriett Sine, CMA, am acting as scribe for Brendolyn Patty, MD.  Documentation: I have reviewed the above documentation for accuracy and completeness, and I agree with the above.  Brendolyn Patty MD

## 2020-02-02 ENCOUNTER — Other Ambulatory Visit: Payer: Self-pay | Admitting: Family Medicine

## 2020-02-02 ENCOUNTER — Telehealth: Payer: Self-pay

## 2020-02-02 ENCOUNTER — Other Ambulatory Visit: Payer: Self-pay | Admitting: *Deleted

## 2020-02-02 DIAGNOSIS — C9110 Chronic lymphocytic leukemia of B-cell type not having achieved remission: Secondary | ICD-10-CM

## 2020-02-02 NOTE — Telephone Encounter (Signed)
125 pm.  Phone call made to patient to schedule PC visit for next week.  No answer but message has been left on VM.  PLAN:  Awaiting call back.  If no call back, I will attempt to contact patient again.

## 2020-02-03 ENCOUNTER — Inpatient Hospital Stay (HOSPITAL_BASED_OUTPATIENT_CLINIC_OR_DEPARTMENT_OTHER): Payer: Medicare Other | Admitting: Oncology

## 2020-02-03 ENCOUNTER — Inpatient Hospital Stay: Payer: Medicare Other | Attending: Oncology

## 2020-02-03 ENCOUNTER — Encounter: Payer: Self-pay | Admitting: Oncology

## 2020-02-03 VITALS — BP 154/68 | HR 58 | Temp 97.1°F | Wt 155.2 lb

## 2020-02-03 DIAGNOSIS — D631 Anemia in chronic kidney disease: Secondary | ICD-10-CM | POA: Diagnosis not present

## 2020-02-03 DIAGNOSIS — E1122 Type 2 diabetes mellitus with diabetic chronic kidney disease: Secondary | ICD-10-CM | POA: Insufficient documentation

## 2020-02-03 DIAGNOSIS — C9110 Chronic lymphocytic leukemia of B-cell type not having achieved remission: Secondary | ICD-10-CM | POA: Diagnosis not present

## 2020-02-03 DIAGNOSIS — I2510 Atherosclerotic heart disease of native coronary artery without angina pectoris: Secondary | ICD-10-CM | POA: Diagnosis not present

## 2020-02-03 DIAGNOSIS — I129 Hypertensive chronic kidney disease with stage 1 through stage 4 chronic kidney disease, or unspecified chronic kidney disease: Secondary | ICD-10-CM | POA: Diagnosis not present

## 2020-02-03 DIAGNOSIS — Z87891 Personal history of nicotine dependence: Secondary | ICD-10-CM | POA: Diagnosis not present

## 2020-02-03 DIAGNOSIS — D693 Immune thrombocytopenic purpura: Secondary | ICD-10-CM | POA: Insufficient documentation

## 2020-02-03 LAB — COMPREHENSIVE METABOLIC PANEL
ALT: 21 U/L (ref 0–44)
AST: 21 U/L (ref 15–41)
Albumin: 4.1 g/dL (ref 3.5–5.0)
Alkaline Phosphatase: 79 U/L (ref 38–126)
Anion gap: 10 (ref 5–15)
BUN: 43 mg/dL — ABNORMAL HIGH (ref 8–23)
CO2: 26 mmol/L (ref 22–32)
Calcium: 9.7 mg/dL (ref 8.9–10.3)
Chloride: 106 mmol/L (ref 98–111)
Creatinine, Ser: 1.53 mg/dL — ABNORMAL HIGH (ref 0.61–1.24)
GFR, Estimated: 46 mL/min — ABNORMAL LOW (ref >60)
Glucose, Bld: 179 mg/dL — ABNORMAL HIGH (ref 70–99)
Potassium: 5 mmol/L (ref 3.5–5.1)
Sodium: 142 mmol/L (ref 135–145)
Total Bilirubin: 0.6 mg/dL (ref 0.3–1.2)
Total Protein: 6.6 g/dL (ref 6.5–8.1)

## 2020-02-03 LAB — CBC WITH DIFFERENTIAL/PLATELET
Abs Immature Granulocytes: 0.01 10*3/uL (ref 0.00–0.07)
Basophils Absolute: 0 10*3/uL (ref 0.0–0.1)
Basophils Relative: 1 %
Eosinophils Absolute: 0.1 10*3/uL (ref 0.0–0.5)
Eosinophils Relative: 2 %
HCT: 33.9 % — ABNORMAL LOW (ref 39.0–52.0)
Hemoglobin: 11 g/dL — ABNORMAL LOW (ref 13.0–17.0)
Immature Granulocytes: 0 %
Lymphocytes Relative: 16 %
Lymphs Abs: 0.6 10*3/uL — ABNORMAL LOW (ref 0.7–4.0)
MCH: 31.3 pg (ref 26.0–34.0)
MCHC: 32.4 g/dL (ref 30.0–36.0)
MCV: 96.3 fL (ref 80.0–100.0)
Monocytes Absolute: 0.4 10*3/uL (ref 0.1–1.0)
Monocytes Relative: 11 %
Neutro Abs: 2.7 10*3/uL (ref 1.7–7.7)
Neutrophils Relative %: 70 %
Platelets: 117 10*3/uL — ABNORMAL LOW (ref 150–400)
RBC: 3.52 MIL/uL — ABNORMAL LOW (ref 4.22–5.81)
RDW: 12.6 % (ref 11.5–15.5)
WBC: 3.9 10*3/uL — ABNORMAL LOW (ref 4.0–10.5)
nRBC: 0 % (ref 0.0–0.2)

## 2020-02-03 LAB — SAMPLE TO BLOOD BANK

## 2020-02-04 ENCOUNTER — Telehealth: Payer: Self-pay

## 2020-02-04 NOTE — Telephone Encounter (Signed)
204 pm.  Phone call completed to patient to schedule an in-person visit for next week.  Patient is agreeable to next Wednesday at 1 pm.

## 2020-02-09 ENCOUNTER — Other Ambulatory Visit: Payer: Medicare Other

## 2020-02-09 ENCOUNTER — Other Ambulatory Visit: Payer: Self-pay

## 2020-02-09 VITALS — BP 140/68 | HR 63 | Temp 97.6°F | Resp 18

## 2020-02-09 DIAGNOSIS — Z515 Encounter for palliative care: Secondary | ICD-10-CM

## 2020-02-09 NOTE — Progress Notes (Signed)
PATIENT NAME: Scott [name] DOB: 10/21/1940 MRN: [id number]  PRIMARY CARE PROVIDER: [name], MD  RESPONSIBLE PARTY:  Acct ID - Guarantor Home Phone Work Phone Relationship Acct Type  [id number] - Bernardy,[name] [phone]  Self P/F     [address], [place], [place] [address]    PLAN OF CARE and INTERVENTIONS:               1.  GOALS OF CARE/ ADVANCE CARE PLANNING:  Patient desires to remain home and independent.               2.  PATIENT/CAREGIVER EDUCATION:  Diabetes               4. PERSONAL EMERGENCY PLAN:  Patient and family will activate 911 for emergencies.               5.  DISEASE STATUS:  Greeted at the door by patient.  He is ambulating independently without assistive devices.  Gait is steady and no falls are reported.  Patient has a companion living with him who helps with household chores and meals.  Patient reports his appetite is good and he is eating 2-3 meals a day.  Patient is taking Levimer nightly but only 12 units vs the 25 units listed on the medication profile.  This am, fasting blood sugar is 64.  Typically fasting blood sugars are ranging from 145- 279 over the last 3 days. Patient is checking blood sugar checks a minimum of 4 x daily.   Reviewed medications with patient.  He confirms medications are correct with the exception of Levimer (see above).   Patient denies any chest pain,  shortness of breath, or dizziness.   Overall patient feels he is doing very well at this time.  There are no new concerns or complaints.    HISTORY OF PRESENT ILLNESS:  79 year old male with HTN and Diabetes.  Patient is being followed by Palliative Care monthly and PRN.  CODE STATUS: Full ADVANCED DIRECTIVES: Yes MOST FORM: No PPS: 60%   PHYSICAL EXAM:   VITALS: Today's Vitals   02/09/20 1313  BP: 140/68  Pulse: 63  Resp: 18  Temp: 97.6 F (36.4 C)  SpO2: 97%  PainSc: 0-No pain    LUNGS: CTA CARDIAC: HRR EXTREMITIES: Trace edema to bilateral lower  extremities. SKIN: No skin breakdown reported. NEURO: Alert and oriented x 3       Lorenza Burton, RN

## 2020-03-01 ENCOUNTER — Encounter: Payer: Self-pay | Admitting: *Deleted

## 2020-03-01 ENCOUNTER — Other Ambulatory Visit: Payer: Self-pay

## 2020-03-01 DIAGNOSIS — Z5321 Procedure and treatment not carried out due to patient leaving prior to being seen by health care provider: Secondary | ICD-10-CM | POA: Insufficient documentation

## 2020-03-01 DIAGNOSIS — R519 Headache, unspecified: Secondary | ICD-10-CM | POA: Diagnosis not present

## 2020-03-01 DIAGNOSIS — R030 Elevated blood-pressure reading, without diagnosis of hypertension: Secondary | ICD-10-CM | POA: Diagnosis not present

## 2020-03-01 LAB — CBC
HCT: 36.3 % — ABNORMAL LOW (ref 39.0–52.0)
Hemoglobin: 12 g/dL — ABNORMAL LOW (ref 13.0–17.0)
MCH: 31.7 pg (ref 26.0–34.0)
MCHC: 33.1 g/dL (ref 30.0–36.0)
MCV: 95.8 fL (ref 80.0–100.0)
Platelets: 160 10*3/uL (ref 150–400)
RBC: 3.79 MIL/uL — ABNORMAL LOW (ref 4.22–5.81)
RDW: 12.3 % (ref 11.5–15.5)
WBC: 5.2 10*3/uL (ref 4.0–10.5)
nRBC: 0 % (ref 0.0–0.2)

## 2020-03-01 LAB — BASIC METABOLIC PANEL
Anion gap: 10 (ref 5–15)
BUN: 41 mg/dL — ABNORMAL HIGH (ref 8–23)
CO2: 26 mmol/L (ref 22–32)
Calcium: 9.9 mg/dL (ref 8.9–10.3)
Chloride: 106 mmol/L (ref 98–111)
Creatinine, Ser: 1.63 mg/dL — ABNORMAL HIGH (ref 0.61–1.24)
GFR, Estimated: 43 mL/min — ABNORMAL LOW (ref >60)
Glucose, Bld: 182 mg/dL — ABNORMAL HIGH (ref 70–99)
Potassium: 4.8 mmol/L (ref 3.5–5.1)
Sodium: 142 mmol/L (ref 135–145)

## 2020-03-01 NOTE — ED Triage Notes (Signed)
Pt says his BP was high at home. He took his regular medication, checked his BP later and it was reading "200". He took additional BP med and ASA. Denies pain, or dizziness, just says he "feels nervous". Did have a headache, but was subsided with tylenol.

## 2020-03-02 ENCOUNTER — Other Ambulatory Visit: Payer: Self-pay | Admitting: Family Medicine

## 2020-03-02 ENCOUNTER — Emergency Department
Admission: EM | Admit: 2020-03-02 | Discharge: 2020-03-02 | Disposition: A | Discharge status: Home / Self Care | Payer: Medicare Other | Attending: Emergency Medicine | Admitting: Emergency Medicine

## 2020-03-07 DIAGNOSIS — Z961 Presence of intraocular lens: Secondary | ICD-10-CM | POA: Diagnosis not present

## 2020-03-07 DIAGNOSIS — H401132 Primary open-angle glaucoma, bilateral, moderate stage: Secondary | ICD-10-CM | POA: Diagnosis not present

## 2020-03-07 DIAGNOSIS — H04123 Dry eye syndrome of bilateral lacrimal glands: Secondary | ICD-10-CM | POA: Diagnosis not present

## 2020-03-07 DIAGNOSIS — H02831 Dermatochalasis of right upper eyelid: Secondary | ICD-10-CM | POA: Diagnosis not present

## 2020-03-07 DIAGNOSIS — E119 Type 2 diabetes mellitus without complications: Secondary | ICD-10-CM | POA: Diagnosis not present

## 2020-03-07 DIAGNOSIS — H02834 Dermatochalasis of left upper eyelid: Secondary | ICD-10-CM | POA: Diagnosis not present

## 2020-03-07 LAB — HM DIABETES EYE EXAM

## 2020-03-13 ENCOUNTER — Encounter: Payer: Self-pay | Admitting: Family Medicine

## 2020-03-17 ENCOUNTER — Telehealth: Payer: Self-pay

## 2020-03-17 NOTE — Telephone Encounter (Signed)
1:30PM: Palliative care SW outreached patient to complete telephonic visit.   Palliative care SW outreached patient to complete telephonic visit. Patient provided update on medical condition and/or changes. Patient shared that he went to the ED on 12/30 due to hypertension but never saw anyone because his wait was over 8 hours. Patient shared that he believes that his BP was hugh that day because he was out working in the yard all day. Patient stated that since then his BP has been fine and he has been feeling okay.  No recent falls. Patients denies any pain. Patient has sleeping okay. Appetite is normal.  Patient denies financial, food insecurities or issues with transportation. No other psychosocial needs. In home visit scheduled for 05/01/2020 @11am  with SW and RN.  Palliative care will continue to monitor and assist with long term care planning as needed.

## 2020-03-21 ENCOUNTER — Other Ambulatory Visit: Payer: Self-pay | Admitting: Family Medicine

## 2020-03-21 DIAGNOSIS — E02 Subclinical iodine-deficiency hypothyroidism: Secondary | ICD-10-CM

## 2020-03-23 ENCOUNTER — Other Ambulatory Visit: Payer: Self-pay | Admitting: Family Medicine

## 2020-03-24 NOTE — Telephone Encounter (Signed)
Pharmacy requests refill on: Levemir Flex Touch 100 unit/mL  LAST REFILL: 12/21/2019 (Q-15 mL, R-0) LAST OV: 01/14/2020 NEXT OV: 04/19/2020 PHARMACY: [facility] #[id number] [place], [place]  Hgb A1C (01/14/2020):7.6

## 2020-03-27 ENCOUNTER — Encounter: Payer: Medicare Other | Admitting: Family Medicine

## 2020-04-09 ENCOUNTER — Other Ambulatory Visit: Payer: Self-pay | Admitting: Family Medicine

## 2020-04-09 DIAGNOSIS — N1831 Chronic kidney disease, stage 3a: Secondary | ICD-10-CM

## 2020-04-09 DIAGNOSIS — N183 Chronic kidney disease, stage 3 unspecified: Secondary | ICD-10-CM

## 2020-04-09 DIAGNOSIS — E785 Hyperlipidemia, unspecified: Secondary | ICD-10-CM

## 2020-04-09 DIAGNOSIS — E1122 Type 2 diabetes mellitus with diabetic chronic kidney disease: Secondary | ICD-10-CM

## 2020-04-09 DIAGNOSIS — N1832 Chronic kidney disease, stage 3b: Secondary | ICD-10-CM

## 2020-04-09 DIAGNOSIS — E1169 Type 2 diabetes mellitus with other specified complication: Secondary | ICD-10-CM

## 2020-04-09 DIAGNOSIS — C9110 Chronic lymphocytic leukemia of B-cell type not having achieved remission: Secondary | ICD-10-CM

## 2020-04-09 DIAGNOSIS — E039 Hypothyroidism, unspecified: Secondary | ICD-10-CM

## 2020-04-09 DIAGNOSIS — D631 Anemia in chronic kidney disease: Secondary | ICD-10-CM

## 2020-04-09 DIAGNOSIS — Z1159 Encounter for screening for other viral diseases: Secondary | ICD-10-CM

## 2020-04-10 ENCOUNTER — Other Ambulatory Visit: Payer: Medicare Other

## 2020-04-11 ENCOUNTER — Ambulatory Visit (INDEPENDENT_AMBULATORY_CARE_PROVIDER_SITE_OTHER): Payer: Medicare Other

## 2020-04-11 ENCOUNTER — Other Ambulatory Visit: Payer: Self-pay

## 2020-04-11 DIAGNOSIS — Z0000 Encounter for general adult medical examination without abnormal findings: Secondary | ICD-10-CM

## 2020-04-11 NOTE — Progress Notes (Signed)
PCP notes:  Health Maintenance: No gaps noted    Abnormal Screenings: none   Patient concerns: Hearing issues  Nurse concerns: none   Next PCP appt.: 04/19/2020 @ 11:30 am

## 2020-04-11 NOTE — Progress Notes (Signed)
Subjective:   Scott [name] is a 80 y.o. male who presents for Medicare Annual/Subsequent preventive examination.  Review of Systems: N/A      I connected with the patient today by telephone and verified that I am speaking with the correct person using two identifiers. Location patient: home Location nurse: work Persons participating in the telephone visit: patient, nurse.   I discussed the limitations, risks, security and privacy concerns of performing an evaluation and management service by telephone and the availability of in person appointments. I also discussed with the patient that there may be a patient responsible charge related to this service. The patient expressed understanding and verbally consented to this telephonic visit.        Cardiac Risk Factors include: advanced age (>24men, >92 women);male gender;diabetes mellitus;hypertension;Other (see comment), Risk factor comments: hyperlipidemia     Objective:    Today's Vitals   There is no height or weight on file to calculate BMI.  Advanced Directives 04/11/2020 02/03/2020 07/27/2019 06/28/2019 06/11/2019 04/26/2019 02/23/2019  Does Patient Have a Medical Advance Directive? Yes Yes Yes Yes No;Yes Yes Yes  Type of Paramedic of [place];Living will [place];Living will [place];Living will - [place];Living will Living will;Healthcare Power of [place];Living will  Does patient want to make changes to medical advance directive? - Yes (ED - Information included in AVS) No - Patient declined - - - No - Patient declined  Copy of [place] in Chart? Yes - validated most recent copy scanned in chart (See row information) - No - copy requested - - - No - copy requested  Would patient like information on creating a medical advance directive? - Yes (ED - Information included in AVS) - - No - Patient declined  - -  Pre-existing out of facility DNR order (yellow form or pink MOST form) - - - - - - -    Current Medications (verified) Outpatient Encounter Medications as of 04/11/2020  Medication Sig  . acetaminophen (TYLENOL) 650 MG CR tablet Take 1,300 mg by mouth 2 (two) times daily.  Marland Kitchen amLODipine (NORVASC) 5 MG tablet Take 1 tablet (5 mg total) by mouth daily.  Marland Kitchen atorvastatin (LIPITOR) 80 MG tablet Take 0.5 tablets (40 mg total) by mouth every Monday, Wednesday, and Friday.  . carvedilol (COREG) 6.25 MG tablet Take 1 tablet (6.25 mg total) by mouth 2 (two) times daily with a meal.  . Continuous Blood Gluc Receiver (FREESTYLE LIBRE 14 DAY READER) DEVI Use as directed to check sugars QID AC HS  . Continuous Blood Gluc Sensor (FREESTYLE LIBRE 14 DAY SENSOR) MISC USE AS DIRECTED TO  CHECK  SUGARS  FOUR  TIMES  DAILY  BEFORE  MEALS  AND  AT  BEDTIME  . LEVEMIR FLEXTOUCH 100 UNIT/ML FlexPen INJECT 25 UNITS SUBCUTANEOUSLY ONCE DAILY AT  10PM  . levothyroxine (SYNTHROID) 88 MCG tablet Take 1 tablet by mouth once daily  . lisinopril (ZESTRIL) 5 MG tablet Take 1 tablet (5 mg total) by mouth at bedtime.  . nitroGLYCERIN (NITROSTAT) 0.4 MG SL tablet Place 1 tablet (0.4 mg total) under the tongue every 5 (five) minutes as needed for chest pain ((MAX of 3 doses)).  Marland Kitchen NOVOLOG FLEXPEN 100 UNIT/ML FlexPen INJECT 5 UNITS SUBCUTANEOUSLY WITH MEALS. INJECT 10 UNITS IF MEAL TIME SUGAR IS GREATER THAN 250.  Marland Kitchen oxymetazoline (AFRIN) 0.05 % nasal spray Place 1 spray into left  nostril daily as needed (nose bleeds).  . traZODone (DESYREL) 100 MG tablet TAKE 1 TABLET BY MOUTH AT BEDTIME AS NEEDED FOR SLEEP   Facility-Administered Encounter Medications as of 04/11/2020  Medication  . sodium chloride flush (NS) 0.9 % injection 10 mL    Allergies (verified) Patient has no known allergies.   History: Past Medical History:  Diagnosis Date  . Acquired hand deformity 1962   hand saw accident at work  . Anemia 10/11/2011  .  Arthritis   . Bradycardia 10/10/2011  . CAD (coronary artery disease) 10/10/2011   MI s/p PTCA (Dx-OM2 proximal concentric stenosis)  . Carotid artery disease ([place])   . Cellulitis of buttock, left 11/17/2018  . Chronic edema   . CKD (chronic kidney disease) stage 3, GFR 30-59 ml/min (HCC)    Scott [name]  . CLL (chronic lymphocytic leukemia) ([place])   . Colon polyps   . Cough 04/28/2019  . Generalized headaches    frequent  . GI bleed 07/08/2017  . Glaucoma    s/p laser surgery  . HLD (hyperlipidemia)   . Hyperkalemia 02/28/2018  . Hypertension   . Hypothyroidism 10/10/2011  . Iron deficiency anemia due to chronic blood loss   . Squamous cell carcinoma of skin 06/15/2019   Right posterior ear. SCCis, hypertrophic, crusted  . Thrombocytopenia ([place]) 10/11/2011  . Type 2 diabetes with nephropathy [facility])    DM refresher course [facility] (04/2013)   Past Surgical History:  Procedure Laterality Date  . BACK SURGERY     cervical neck  . CATARACT EXTRACTION, BILATERAL Bilateral 2017  . COLONOSCOPY  11/2008   1 polyp, diverticulosis, rec rpt 5 yrs (Dr. Oletta Lamas, [name])  . COLONOSCOPY  06/2014   hyperplastic polyp, rpt 5 yrs ([place])  . COLONOSCOPY WITH PROPOFOL N/A 11/17/2017   TA, HP, (Vanga, Tally Due, MD)  . ESOPHAGOGASTRODUODENOSCOPY (EGD) WITH PROPOFOL N/A 11/17/2017   healing erosive gastritis, intestinal metaplasia, neg H pylori (Vanga, Tally Due, MD)  . ESOPHAGOGASTRODUODENOSCOPY (EGD) WITH PROPOFOL N/A 06/28/2019   erosive gastropathy with stigmata of recent bleed, granular tissue in esophagus biopsied- reflux esophagitis, small HH (Vanga, Tally Due, MD)  . EYE SURGERY  2012   laser surgery for glaucoma  . PORTACATH PLACEMENT Left 12/09/2018   Procedure: INSERTION PORT-A-CATH;  Surgeon: [name], MD;  Location: [facility] ORS;  Service: General;  Laterality: Left;  . PTCA  1994, 1995  . US ECHOCARDIOGRAPHY  10/2013   inferior wall hypokinesis, mild LVH, EF 50-55%, mild MR and LA dilation    Family History  Problem Relation Age of Onset  . Diabetes Sister   . Stomach cancer Sister 43  . Pneumonia Father        caused death  . Other Brother        no communication with brother so unsure of any health conditions  . Coronary artery disease Son 69       5v CABG and stents  . Hyperlipidemia Sister   . Stroke Neg Hx   . Heart attack Neg Hx    Social History   Socioeconomic History  . Marital status: Widowed    Spouse name: Not on file  . Number of children: Not on file  . Years of education: Not on file  . Highest education level: Not on file  Occupational History  . Not on file  Tobacco Use  . Smoking status: Former Smoker    Quit date: 03/04/1978    Years since quitting: 42.1  .  Smokeless tobacco: Never Used  Vaping Use  . Vaping Use: Never used  Substance and Sexual Activity  . Alcohol use: No  . Drug use: No  . Sexual activity: Never  Other Topics Concern  . Not on file  Social History Narrative   Widower. Wife passed 08/2015 from cancer. 1 dog   Occupation: retired, was route Hotel manager   Edu: 11th gr   Activity: walks dog (pomeranian) 3-4x/day, yardwork, rides bicycle.   Diet: drinks diet coke, no vegetables   Social Determinants of Health   Financial Resource Strain: Low Risk   . Difficulty of Paying Living Expenses: Not hard at all  Food Insecurity: No Food Insecurity  . Worried About Charity fundraiser in the Last Year: Never true  . Ran Out of Food in the Last Year: Never true  Transportation Needs: No Transportation Needs  . Lack of Transportation (Medical): No  . Lack of Transportation (Non-Medical): No  Physical Activity: Inactive  . Days of Exercise per Week: 0 days  . Minutes of Exercise per Session: 0 min  Stress: No Stress Concern Present  . Feeling of Stress : Not at all  Social Connections: Not on file    Tobacco Counseling Counseling given: Not Answered   Clinical Intake:  Pre-visit preparation completed: Yes  Pain :  No/denies pain     Nutritional Risks: None Diabetes: Yes CBG done?: No Did pt. bring in CBG monitor from home?: No  How often do you need to have someone help you when you read instructions, pamphlets, or other written materials from your doctor or pharmacy?: 1 - Never What is the last grade level you completed in school?: 11th  Diabetic: Yes Nutrition Risk Assessment:   Has the patient had any N/V/D within the last 2 months?  No  Does the patient have any non-healing wounds?  No  Has the patient had any unintentional weight loss or weight gain?  No   Diabetes:  Is the patient diabetic?  Yes  If diabetic, was a CBG obtained today?  No  telephone visit  Did the patient bring in their glucometer from home?  No  telephone visit  How often do you monitor your CBG's? Everyday.   Financial Strains and Diabetes Management:  Are you having any financial strains with the device, your supplies or your medication? No .  Does the patient want to be seen by Chronic Care Management for management of their diabetes?  No  Would the patient like to be referred to a Nutritionist or for Diabetic Management?  No   Diabetic Exams:  Diabetic Eye Exam: Completed 03/07/2020 Diabetic Foot Exam: Completed 01/14/2020   Interpreter Needed?: No  Information entered by :: CJohnson, LPN   Activities of Daily Living In your present state of health, do you have any difficulty performing the following activities: 04/11/2020  Hearing? Y  Comment some hearing loss  Vision? N  Difficulty concentrating or making decisions? Y  Comment some memory issues noted  Walking or climbing stairs? N  Dressing or bathing? N  Doing errands, shopping? N  Preparing Food and eating ? N  Using the Toilet? N  In the past six months, have you accidently leaked urine? Y  Comment sometimes  Do you have problems with loss of bowel control? Y  Comment sometimes  Managing your Medications? N  Managing your Finances? N   Housekeeping or managing your Housekeeping? N  Some recent data might be hidden  Patient Care Team: [name], MD as PCP - General (Family Medicine) Dorothy Spark, MD as PCP - Cardiology (Cardiology) Lloyd Huger, MD as Consulting Physician (Oncology) Clent Jacks, MD as Consulting Physician (Ophthalmology) Dorothy Spark, MD as Consulting Physician (Cardiology) Madelon Lips, MD as Consulting Physician (Nephrology)  Indicate any recent Medical Services you may have received from other than [facility] providers in the past year (date may be approximate).     Assessment:   This is a routine wellness examination for [place].  Hearing/Vision screen  Hearing Screening   125Hz  250Hz  500Hz  1000Hz  2000Hz  3000Hz  4000Hz  6000Hz  8000Hz   Right ear:           Left ear:           Vision Screening Comments: Patient gets annual eye exams   Dietary issues and exercise activities discussed: Current Exercise Habits: The patient does not participate in regular exercise at present, Exercise limited by: None identified  Goals    . Patient Stated     Starting 02/20/2018, I will continue to take medications as prescribed.     . Patient Stated     02/22/2019, I will maintain and continue medications as prescribed.     . Patient Stated     04/11/2020, I will maintain and continue medications as prescribed.       Depression Screen PHQ 2/9 Scores 04/11/2020 02/22/2019 03/19/2018 02/20/2018 02/19/2017 02/12/2016 02/08/2015  PHQ - 2 Score 0 0 0 0 0 0 0  PHQ- 9 Score 0 0 - 0 0 - -    Fall Risk Fall Risk  04/11/2020 02/22/2019 12/25/2018 12/08/2018 10/26/2018  Falls in the past year? 0 1 0 0 0  Comment - tripped out of shower - - -  Number falls in past yr: 0 0 0 - -  Comment - - - - -  Injury with Fall? 0 0 0 - -  Risk for fall due to : Medication side effect Medication side effect - - -  Follow up Falls evaluation completed;Falls prevention discussed Falls evaluation  completed;Falls prevention discussed - - -    FALL RISK PREVENTION PERTAINING TO THE HOME:  Any stairs in or around the home? Yes  If so, are there any without handrails? No  Home free of loose throw rugs in walkways, pet beds, electrical cords, etc? Yes  Adequate lighting in your home to reduce risk of falls? Yes   ASSISTIVE DEVICES UTILIZED TO PREVENT FALLS:  Life alert? No  Use of a cane, walker or w/c? No  Grab bars in the bathroom? Yes  Shower chair or bench in shower? No  Elevated toilet seat or a handicapped toilet? No   TIMED UP AND GO:  Was the test performed? N/A telephone visit .   Cognitive Function: MMSE - Mini Mental State Exam 04/11/2020 02/22/2019 02/20/2018 02/19/2017 02/12/2016  Orientation to time 5 5 5 5 5   Orientation to Place 5 5 5 5 5   Registration 3 3 3 3 3   Attention/ Calculation 5 5 0 0 0  Recall 3 3 2 1 3   Recall-comments - - unable to recall 1 of 3 word unable to recall 2 of 3 words -  Language- name 2 objects - - 0 0 0  Language- repeat 1 1 1 1 1   Language- follow 3 step command - - 3 3 3   Language- read & follow direction - - 0 0 0  Write a sentence - - 0 0 0  Copy design - - 0 0 0  Total score - - 19 18 20   Mini Cog  Mini-Cog screen was completed. Maximum score is 22. A value of 0 denotes this part of the MMSE was not completed or the patient failed this part of the Mini-Cog screening.       Immunizations Immunization History  Administered Date(s) Administered  . Fluad Quad(high Dose 65+) 11/04/2018  . Influenza Split 12/03/2011  . Influenza, High Dose Seasonal PF 12/10/2019  . Influenza,inj,Quad PF,6+ Mos 11/06/2012, 01/25/2014, 02/08/2015, 01/03/2016, 02/17/2017, 01/22/2018  . PFIZER(Purple Top)SARS-COV-2 Vaccination 05/02/2019, 05/23/2019, 12/10/2019  . Pneumococcal Conjugate-13 07/21/2013  . Pneumococcal Polysaccharide-23 03/11/2011  . Tdap 08/23/2017  . Zoster 03/17/2013    TDAP status: Up to date  Flu Vaccine status: Up to  date  Pneumococcal vaccine status: Up to date  Covid-19 vaccine status: Completed vaccines  Qualifies for Shingles Vaccine? Yes   Zostavax completed Yes   Shingrix Completed?: No.    Education has been provided regarding the importance of this vaccine. Patient has been advised to call insurance company to determine out of pocket expense if they have not yet received this vaccine. Advised may also receive vaccine at local pharmacy or Health Dept. Verbalized acceptance and understanding.  Screening Tests Health Maintenance  Topic Date Due  . Hepatitis C Screening  Never done  . COVID-19 Vaccine (4 - Booster for Pfizer series) 06/09/2020  . HEMOGLOBIN A1C  07/13/2020  . FOOT EXAM  01/13/2021  . OPHTHALMOLOGY EXAM  03/07/2021  . COLONOSCOPY (Pts 45-61yrs Insurance coverage will need to be confirmed)  11/18/2022  . TETANUS/TDAP  08/24/2027  . INFLUENZA VACCINE  Completed  . PNA vac Low Risk Adult  Completed    Health Maintenance  Health Maintenance Due  Topic Date Due  . Hepatitis C Screening  Never done    Colorectal cancer screening: Type of screening: Colonoscopy. Completed 11/17/2017. Repeat every 5 years  Lung Cancer Screening: (Low Dose CT Chest recommended if Age 11-80 years, 30 pack-year currently smoking OR have quit w/in 15years.) does not qualify.    Additional Screening:  Hepatitis C Screening: does qualify; Completed due  Vision Screening: Recommended annual ophthalmology exams for early detection of glaucoma and other disorders of the eye. Is the patient up to date with their annual eye exam?  Yes  Who is the provider or what is the name of the office in which the patient attends annual eye exams? Dr. Katy Fitch  If pt is not established with a provider, would they like to be referred to a provider to establish care? No .   Dental Screening: Recommended annual dental exams for proper oral hygiene  Community Resource Referral / Chronic Care Management: CRR required  this visit?  No   CCM required this visit?  No      Plan:     I have personally reviewed and noted the following in the patient's chart:   . Medical and social history . Use of alcohol, tobacco or illicit drugs  . Current medications and supplements . Functional ability and status . Nutritional status . Physical activity . Advanced directives . List of other physicians . Hospitalizations, surgeries, and ER visits in previous 12 months . Vitals . Screenings to include cognitive, depression, and falls . Referrals and appointments  In addition, I have reviewed and discussed with patient certain preventive protocols, quality metrics, and best practice recommendations. A written personalized care plan for preventive services as well as general preventive  health recommendations were provided to patient.   Due to this being a telephonic visit, the after visit summary with patients personalized plan was offered to patient via mail or my-chart. Patient preferred to pick up at office at next visit or via mychart.   Andrez Grime, LPN   0/08/2692

## 2020-04-11 NOTE — Patient Instructions (Signed)
[name] , Thank you for taking time to come for your Medicare Wellness Visit. I appreciate your ongoing commitment to your health goals. Please review the following plan we discussed and let me know if I can assist you in the future.   Screening recommendations/referrals: Colonoscopy: Up to date, completed 11/17/2017, due 11/2022 Recommended yearly ophthalmology/optometry visit for glaucoma screening and checkup Recommended yearly dental visit for hygiene and checkup  Vaccinations: Influenza vaccine: Up to date, completed 12/10/2019, due 10/2020 Pneumococcal vaccine: Completed series Tdap vaccine: Up to date, completed 08/23/2017, due 08/2027 Shingles vaccine: due, check with your insurance regarding coverage if interested    Covid-19: Completed series  Advanced directives: copy in chart   Conditions/risks identified: diabetes, hypertension, hyperlipidemia   Next appointment: Follow up in one year for your annual wellness visit.   Preventive Care 32 Years and Older, Male Preventive care refers to lifestyle choices and visits with your health care provider that can promote health and wellness. What does preventive care include?  A yearly physical exam. This is also called an annual well check.  Dental exams once or twice a year.  Routine eye exams. Ask your health care provider how often you should have your eyes checked.  Personal lifestyle choices, including:  Daily care of your teeth and gums.  Regular physical activity.  Eating a healthy diet.  Avoiding tobacco and drug use.  Limiting alcohol use.  Practicing safe sex.  Taking low doses of aspirin every day.  Taking vitamin and mineral supplements as recommended by your health care provider. What happens during an annual well check? The services and screenings done by your health care provider during your annual well check will depend on your age, overall health, lifestyle risk factors, and family history of  disease. Counseling  Your health care provider may ask you questions about your:  Alcohol use.  Tobacco use.  Drug use.  Emotional well-being.  Home and relationship well-being.  Sexual activity.  Eating habits.  History of falls.  Memory and ability to understand (cognition).  Work and work Statistician. Screening  You may have the following tests or measurements:  Height, weight, and BMI.  Blood pressure.  Lipid and cholesterol levels. These may be checked every 5 years, or more frequently if you are over 43 years old.  Skin check.  Lung cancer screening. You may have this screening every year starting at age 33 if you have a 30-pack-year history of smoking and currently smoke or have quit within the past 15 years.  Fecal occult blood test (FOBT) of the stool. You may have this test every year starting at age 76.  Flexible sigmoidoscopy or colonoscopy. You may have a sigmoidoscopy every 5 years or a colonoscopy every 10 years starting at age 40.  Prostate cancer screening. Recommendations will vary depending on your family history and other risks.  Hepatitis C blood test.  Hepatitis B blood test.  Sexually transmitted disease (STD) testing.  Diabetes screening. This is done by checking your blood sugar (glucose) after you have not eaten for a while (fasting). You may have this done every 1-3 years.  Abdominal aortic aneurysm (AAA) screening. You may need this if you are a current or former smoker.  Osteoporosis. You may be screened starting at age 19 if you are at high risk. Talk with your health care provider about your test results, treatment options, and if necessary, the need for more tests. Vaccines  Your health care provider may recommend certain  vaccines, such as:  Influenza vaccine. This is recommended every year.  Tetanus, diphtheria, and acellular pertussis (Tdap, Td) vaccine. You may need a Td booster every 10 years.  Zoster vaccine. You may  need this after age 63.  Pneumococcal 13-valent conjugate (PCV13) vaccine. One dose is recommended after age 25.  Pneumococcal polysaccharide (PPSV23) vaccine. One dose is recommended after age 22. Talk to your health care provider about which screenings and vaccines you need and how often you need them. This information is not intended to replace advice given to you by your health care provider. Make sure you discuss any questions you have with your health care provider. Document Released: 03/17/2015 Document Revised: 11/08/2015 Document Reviewed: 12/20/2014 Elsevier Interactive Patient Education  2017 [employer] Prevention in the Home Falls can cause injuries. They can happen to people of all ages. There are many things you can do to make your home safe and to help prevent falls. What can I do on the outside of my home?  Regularly fix the edges of walkways and driveways and fix any cracks.  Remove anything that might make you trip as you walk through a door, such as a raised step or threshold.  Trim any bushes or trees on the path to your home.  Use bright outdoor lighting.  Clear any walking paths of anything that might make someone trip, such as rocks or tools.  Regularly check to see if handrails are loose or broken. Make sure that both sides of any steps have handrails.  Any raised decks and porches should have guardrails on the edges.  Have any leaves, snow, or ice cleared regularly.  Use sand or salt on walking paths during winter.  Clean up any spills in your garage right away. This includes oil or grease spills. What can I do in the bathroom?  Use night lights.  Install grab bars by the toilet and in the tub and shower. Do not use towel bars as grab bars.  Use non-skid mats or decals in the tub or shower.  If you need to sit down in the shower, use a plastic, non-slip stool.  Keep the floor dry. Clean up any water that spills on the floor as soon as it  happens.  Remove soap buildup in the tub or shower regularly.  Attach bath mats securely with double-sided non-slip rug tape.  Do not have throw rugs and other things on the floor that can make you trip. What can I do in the bedroom?  Use night lights.  Make sure that you have a light by your bed that is easy to reach.  Do not use any sheets or blankets that are too big for your bed. They should not hang down onto the floor.  Have a firm chair that has side arms. You can use this for support while you get dressed.  Do not have throw rugs and other things on the floor that can make you trip. What can I do in the kitchen?  Clean up any spills right away.  Avoid walking on wet floors.  Keep items that you use a lot in easy-to-reach places.  If you need to reach something above you, use a strong step stool that has a grab bar.  Keep electrical cords out of the way.  Do not use floor polish or wax that makes floors slippery. If you must use wax, use non-skid floor wax.  Do not have throw rugs and other things  on the floor that can make you trip. What can I do with my stairs?  Do not leave any items on the stairs.  Make sure that there are handrails on both sides of the stairs and use them. Fix handrails that are broken or loose. Make sure that handrails are as long as the stairways.  Check any carpeting to make sure that it is firmly attached to the stairs. Fix any carpet that is loose or worn.  Avoid having throw rugs at the top or bottom of the stairs. If you do have throw rugs, attach them to the floor with carpet tape.  Make sure that you have a light switch at the top of the stairs and the bottom of the stairs. If you do not have them, ask someone to add them for you. What else can I do to help prevent falls?  Wear shoes that:  Do not have high heels.  Have rubber bottoms.  Are comfortable and fit you well.  Are closed at the toe. Do not wear sandals.  If you  use a stepladder:  Make sure that it is fully opened. Do not climb a closed stepladder.  Make sure that both sides of the stepladder are locked into place.  Ask someone to hold it for you, if possible.  Clearly mark and make sure that you can see:  Any grab bars or handrails.  First and last steps.  Where the edge of each step is.  Use tools that help you move around (mobility aids) if they are needed. These include:  Canes.  Walkers.  Scooters.  Crutches.  Turn on the lights when you go into a dark area. Replace any light bulbs as soon as they burn out.  Set up your furniture so you have a clear path. Avoid moving your furniture around.  If any of your floors are uneven, fix them.  If there are any pets around you, be aware of where they are.  Review your medicines with your doctor. Some medicines can make you feel dizzy. This can increase your chance of falling. Ask your doctor what other things that you can do to help prevent falls. This information is not intended to replace advice given to you by your health care provider. Make sure you discuss any questions you have with your health care provider. Document Released: 12/15/2008 Document Revised: 07/27/2015 Document Reviewed: 03/25/2014 Elsevier Interactive Patient Education  2017 [employer].

## 2020-04-12 ENCOUNTER — Other Ambulatory Visit (INDEPENDENT_AMBULATORY_CARE_PROVIDER_SITE_OTHER): Payer: Medicare Other

## 2020-04-12 ENCOUNTER — Other Ambulatory Visit: Payer: Self-pay

## 2020-04-12 DIAGNOSIS — Z794 Long term (current) use of insulin: Secondary | ICD-10-CM

## 2020-04-12 DIAGNOSIS — Z1159 Encounter for screening for other viral diseases: Secondary | ICD-10-CM | POA: Diagnosis not present

## 2020-04-12 DIAGNOSIS — E1169 Type 2 diabetes mellitus with other specified complication: Secondary | ICD-10-CM

## 2020-04-12 DIAGNOSIS — E1122 Type 2 diabetes mellitus with diabetic chronic kidney disease: Secondary | ICD-10-CM | POA: Diagnosis not present

## 2020-04-12 DIAGNOSIS — E785 Hyperlipidemia, unspecified: Secondary | ICD-10-CM | POA: Diagnosis not present

## 2020-04-12 DIAGNOSIS — N1831 Chronic kidney disease, stage 3a: Secondary | ICD-10-CM | POA: Diagnosis not present

## 2020-04-12 DIAGNOSIS — C9110 Chronic lymphocytic leukemia of B-cell type not having achieved remission: Secondary | ICD-10-CM

## 2020-04-12 DIAGNOSIS — E039 Hypothyroidism, unspecified: Secondary | ICD-10-CM | POA: Diagnosis not present

## 2020-04-12 DIAGNOSIS — N1832 Chronic kidney disease, stage 3b: Secondary | ICD-10-CM

## 2020-04-12 DIAGNOSIS — D631 Anemia in chronic kidney disease: Secondary | ICD-10-CM

## 2020-04-12 DIAGNOSIS — N183 Chronic kidney disease, stage 3 unspecified: Secondary | ICD-10-CM | POA: Diagnosis not present

## 2020-04-12 LAB — CBC WITH DIFFERENTIAL/PLATELET
Basophils Absolute: 0 10*3/uL (ref 0.0–0.1)
Basophils Relative: 0.5 % (ref 0.0–3.0)
Eosinophils Absolute: 0.1 10*3/uL (ref 0.0–0.7)
Eosinophils Relative: 2.3 % (ref 0.0–5.0)
HCT: 35.2 % — ABNORMAL LOW (ref 39.0–52.0)
Hemoglobin: 11.9 g/dL — ABNORMAL LOW (ref 13.0–17.0)
Lymphocytes Relative: 18.7 % (ref 12.0–46.0)
Lymphs Abs: 0.7 10*3/uL (ref 0.7–4.0)
MCHC: 33.7 g/dL (ref 30.0–36.0)
MCV: 93.4 fl (ref 78.0–100.0)
Monocytes Absolute: 0.3 10*3/uL (ref 0.1–1.0)
Monocytes Relative: 8.4 % (ref 3.0–12.0)
Neutro Abs: 2.5 10*3/uL (ref 1.4–7.7)
Neutrophils Relative %: 70.1 % (ref 43.0–77.0)
Platelets: 130 10*3/uL — ABNORMAL LOW (ref 150.0–400.0)
RBC: 3.77 Mil/uL — ABNORMAL LOW (ref 4.22–5.81)
RDW: 13 % (ref 11.5–15.5)
WBC: 3.5 10*3/uL — ABNORMAL LOW (ref 4.0–10.5)

## 2020-04-12 LAB — COMPREHENSIVE METABOLIC PANEL
ALT: 20 U/L (ref 0–53)
AST: 20 U/L (ref 0–37)
Albumin: 4.4 g/dL (ref 3.5–5.2)
Alkaline Phosphatase: 83 U/L (ref 39–117)
BUN: 39 mg/dL — ABNORMAL HIGH (ref 6–23)
CO2: 29 mEq/L (ref 19–32)
Calcium: 10.2 mg/dL (ref 8.4–10.5)
Chloride: 104 mEq/L (ref 96–112)
Creatinine, Ser: 1.56 mg/dL — ABNORMAL HIGH (ref 0.40–1.50)
GFR: 41.88 mL/min — ABNORMAL LOW (ref >60.00)
Glucose, Bld: 175 mg/dL — ABNORMAL HIGH (ref 70–99)
Potassium: 5 mEq/L (ref 3.5–5.1)
Sodium: 141 mEq/L (ref 135–145)
Total Bilirubin: 0.6 mg/dL (ref 0.2–1.2)
Total Protein: 6.5 g/dL (ref 6.0–8.3)

## 2020-04-12 LAB — LIPID PANEL
Cholesterol: 179 mg/dL (ref 0–200)
HDL: 40.4 mg/dL (ref >39.00)
LDL Cholesterol: 99 mg/dL (ref 0–99)
NonHDL: 138.15
Total CHOL/HDL Ratio: 4
Triglycerides: 198 mg/dL — ABNORMAL HIGH (ref 0.0–149.0)
VLDL: 39.6 mg/dL (ref 0.0–40.0)

## 2020-04-12 LAB — TSH: TSH: 1.8 u[IU]/mL (ref 0.35–4.50)

## 2020-04-12 LAB — VITAMIN D 25 HYDROXY (VIT D DEFICIENCY, FRACTURES): VITD: 41.62 ng/mL (ref 30.00–100.00)

## 2020-04-12 LAB — HEMOGLOBIN A1C: Hgb A1c MFr Bld: 8 % — ABNORMAL HIGH (ref 4.6–6.5)

## 2020-04-13 LAB — HEPATITIS C ANTIBODY
Hepatitis C Ab: NONREACTIVE
SIGNAL TO CUT-OFF: 0.01 (ref <1.00)

## 2020-04-14 ENCOUNTER — Ambulatory Visit: Payer: Medicare Other | Admitting: Family Medicine

## 2020-04-19 ENCOUNTER — Other Ambulatory Visit: Payer: Self-pay

## 2020-04-19 ENCOUNTER — Encounter: Payer: Self-pay | Admitting: Family Medicine

## 2020-04-19 ENCOUNTER — Ambulatory Visit (INDEPENDENT_AMBULATORY_CARE_PROVIDER_SITE_OTHER): Payer: Medicare Other | Admitting: Family Medicine

## 2020-04-19 VITALS — BP 134/78 | HR 62 | Temp 97.7°F | Ht 70.0 in | Wt 156.3 lb

## 2020-04-19 DIAGNOSIS — C9110 Chronic lymphocytic leukemia of B-cell type not having achieved remission: Secondary | ICD-10-CM | POA: Diagnosis not present

## 2020-04-19 DIAGNOSIS — E1122 Type 2 diabetes mellitus with diabetic chronic kidney disease: Secondary | ICD-10-CM | POA: Diagnosis not present

## 2020-04-19 DIAGNOSIS — Z794 Long term (current) use of insulin: Secondary | ICD-10-CM

## 2020-04-19 DIAGNOSIS — N183 Chronic kidney disease, stage 3 unspecified: Secondary | ICD-10-CM | POA: Diagnosis not present

## 2020-04-19 DIAGNOSIS — M21941 Unspecified acquired deformity of hand, right hand: Secondary | ICD-10-CM

## 2020-04-19 DIAGNOSIS — E1169 Type 2 diabetes mellitus with other specified complication: Secondary | ICD-10-CM | POA: Diagnosis not present

## 2020-04-19 DIAGNOSIS — I6523 Occlusion and stenosis of bilateral carotid arteries: Secondary | ICD-10-CM

## 2020-04-19 DIAGNOSIS — E785 Hyperlipidemia, unspecified: Secondary | ICD-10-CM

## 2020-04-19 DIAGNOSIS — N1832 Chronic kidney disease, stage 3b: Secondary | ICD-10-CM | POA: Diagnosis not present

## 2020-04-19 DIAGNOSIS — I10 Essential (primary) hypertension: Secondary | ICD-10-CM

## 2020-04-19 DIAGNOSIS — D61818 Other pancytopenia: Secondary | ICD-10-CM | POA: Insufficient documentation

## 2020-04-19 DIAGNOSIS — E02 Subclinical iodine-deficiency hypothyroidism: Secondary | ICD-10-CM

## 2020-04-19 MED ORDER — ATORVASTATIN CALCIUM 80 MG PO TABS: 80.0000 mg | ORAL_TABLET | ORAL | 3 refills | Status: DC

## 2020-04-19 MED ORDER — LEVOTHYROXINE SODIUM 88 MCG PO TABS: 88.0000 ug | ORAL_TABLET | Freq: Every day | ORAL | 3 refills | Status: DC

## 2020-04-19 NOTE — Patient Instructions (Addendum)
If interested, check with pharmacy about new 2 shot shingles series (shingrix).  Continue current medicines including lower dose of levemir 15u daily.  Increase water intake for kidneys.  Return as needed or in 4 months for follow up visit.   Health Maintenance After Age 80 After age 19, you are at a higher risk for certain long-term diseases and infections as well as injuries from falls. Falls are a major cause of broken bones and head injuries in people who are older than age 73. Getting regular preventive care can help to keep you healthy and well. Preventive care includes getting regular testing and making lifestyle changes as recommended by your health care provider. Talk with your health care provider about:  Which screenings and tests you should have. A screening is a test that checks for a disease when you have no symptoms.  A diet and exercise plan that is right for you. What should I know about screenings and tests to prevent falls? Screening and testing are the best ways to find a health problem early. Early diagnosis and treatment give you the best chance of managing medical conditions that are common after age 15. Certain conditions and lifestyle choices may make you more likely to have a fall. Your health care provider may recommend:  Regular vision checks. Poor vision and conditions such as cataracts can make you more likely to have a fall. If you wear glasses, make sure to get your prescription updated if your vision changes.  Medicine review. Work with your health care provider to regularly review all of the medicines you are taking, including over-the-counter medicines. Ask your health care provider about any side effects that may make you more likely to have a fall. Tell your health care provider if any medicines that you take make you feel dizzy or sleepy.  Osteoporosis screening. Osteoporosis is a condition that causes the bones to get weaker. This can make the bones weak and  cause them to break more easily.  Blood pressure screening. Blood pressure changes and medicines to control blood pressure can make you feel dizzy.  Strength and balance checks. Your health care provider may recommend certain tests to check your strength and balance while standing, walking, or changing positions.  Foot health exam. Foot pain and numbness, as well as not wearing proper footwear, can make you more likely to have a fall.  Depression screening. You may be more likely to have a fall if you have a fear of falling, feel emotionally low, or feel unable to do activities that you used to do.  Alcohol use screening. Using too much alcohol can affect your balance and may make you more likely to have a fall. What actions can I take to lower my risk of falls? General instructions  Talk with your health care provider about your risks for falling. Tell your health care provider if: ? You fall. Be sure to tell your health care provider about all falls, even ones that seem minor. ? You feel dizzy, sleepy, or off-balance.  Take over-the-counter and prescription medicines only as told by your health care provider. These include any supplements.  Eat a healthy diet and maintain a healthy weight. A healthy diet includes low-fat dairy products, low-fat (lean) meats, and fiber from whole grains, beans, and lots of fruits and vegetables. Home safety  Remove any tripping hazards, such as rugs, cords, and clutter.  Install safety equipment such as grab bars in bathrooms and safety rails on stairs.  Keep rooms and walkways well-lit. Activity  Follow a regular exercise program to stay fit. This will help you maintain your balance. Ask your health care provider what types of exercise are appropriate for you.  If you need a cane or walker, use it as recommended by your health care provider.  Wear supportive shoes that have nonskid soles.   Lifestyle  Do not drink alcohol if your health care  provider tells you not to drink.  If you drink alcohol, limit how much you have: ? 0-1 drink a day for women. ? 0-2 drinks a day for men.  Be aware of how much alcohol is in your drink. In the [place] one drink equals one typical bottle of beer (12 oz), one-half glass of wine (5 oz), or one shot of hard liquor (1 oz).  Do not use any products that contain nicotine or tobacco, such as cigarettes and e-cigarettes. If you need help quitting, ask your health care provider. Summary  Having a healthy lifestyle and getting preventive care can help to protect your health and wellness after age 75.  Screening and testing are the best way to find a health problem early and help you avoid having a fall. Early diagnosis and treatment give you the best chance for managing medical conditions that are more common for people who are older than age 3.  Falls are a major cause of broken bones and head injuries in people who are older than age 68. Take precautions to prevent a fall at home.  Work with your health care provider to learn what changes you can make to improve your health and wellness and to prevent falls. This information is not intended to replace advice given to you by your health care provider. Make sure you discuss any questions you have with your health care provider. Document Revised: 06/11/2018 Document Reviewed: 01/01/2017 Elsevier Patient Education  2021 [employer].

## 2020-04-19 NOTE — Assessment & Plan Note (Signed)
Update carotid US as overdue.

## 2020-04-19 NOTE — Assessment & Plan Note (Signed)
Chronic, stable on current regimen of lisinopril carvedilol and amlodipine.

## 2020-04-19 NOTE — Progress Notes (Signed)
Patient ID: Scott [name], male    DOB: 01-19-1941, 80 y.o.   MRN: [id number]  This visit was conducted in person.  BP 134/78   Pulse 62   Temp 97.7 F (36.5 C) (Temporal)   Ht 5\' 10"  (1.778 m)   Wt 156 lb 5 oz (70.9 kg)   SpO2 100%   BMI 22.43 kg/m    CC: CPE Subjective:   HPI: Scott [name] is a 80 y.o. male presenting on 04/19/2020 for Annual Exam (Prt 2. )   Saw health advisor last week for medicare wellness visit. Note reviewed.  Enjoys hanging out with his GF.   No exam data present  Flowsheet Row Clinical Support from 04/11/2020 in [facility]  PHQ-2 Total Score 0      Fall Risk  04/11/2020 02/22/2019 12/25/2018 12/08/2018 10/26/2018  Falls in the past year? 0 1 0 0 0  Comment - tripped out of shower - - -  Number falls in past yr: 0 0 0 - -  Comment - - - - -  Injury with Fall? 0 0 0 - -  Risk for fall due to : Medication side effect Medication side effect - - -  Follow up Falls evaluation completed;Falls prevention discussed Falls evaluation completed;Falls prevention discussed - - -    CLL followed by oncology [name]).   Insulin using T2DM - uses CGM to monitor sugars. Current regimen includes novolog 3-5u with meals, levemir 14-15u QAM. Previously on metformin. cbg today 101 @ 7am, 93 @ 8:30am, 138 @ 11am. Last visit we referred to endocrinology - pending appt next month.   Preventative: ESOPHAGOGASTRODUODENOSCOPY (EGD) WITH PROPOFOL 11/17/2017 - healing erosive gastritis, intestinal metaplasia, neg H pylori (Vanga, Tally Due, MD) COLONOSCOPY WITH PROPOFOL 11/17/2017 - TA, HP, (Vanga, Tally Due, MD) Prostate screening - normal DRE and PSA2015-decided to age out. Flu shotyearly COVID vaccine Pfizer 04/2019, 05/2019, 12/2019  Pneumovax 2013. prevnar 07/2013 Tdap 08/2017 zostavax 2015 shingrix - discussed   Advanced directives -previously wantedfull codebut with wife's death experience, likely would change decision. Has  updated living will with lawyer - will bring [place] copy. HCPOA likely 2 sons. Seat belt use discussed. Sunscreen use discussed.No changing moles on skin.sees derm.  Ex smoker Alcohol use - none Dentist - has dentures, new ones Eye exam yearly  Bowel - no constipation - takes stool softener PRN Bladder - no incontinence  Livesalone - widower2017, dog also passed away Occupation: retired, was route Hotel manager Edu: 11th grade Activity: some treadmill  Diet: drinks diet coke, no vegetables     Relevant past medical, surgical, family and social history reviewed and updated as indicated. Interim medical history since our last visit reviewed. Allergies and medications reviewed and updated. Outpatient Medications Prior to Visit  Medication Sig Dispense Refill  . acetaminophen (TYLENOL) 650 MG CR tablet Take 1,300 mg by mouth 2 (two) times daily.    Marland Kitchen amLODipine (NORVASC) 5 MG tablet Take 1 tablet (5 mg total) by mouth daily. 90 tablet 3  . carvedilol (COREG) 6.25 MG tablet Take 1 tablet (6.25 mg total) by mouth 2 (two) times daily with a meal. 180 tablet 3  . Continuous Blood Gluc Receiver (FREESTYLE LIBRE 14 DAY READER) DEVI Use as directed to check sugars QID AC HS 2 each 3  . Continuous Blood Gluc Sensor (FREESTYLE LIBRE 14 DAY SENSOR) MISC USE AS DIRECTED TO  CHECK  SUGARS  FOUR  TIMES  DAILY  BEFORE  MEALS  AND  AT  BEDTIME 2 each 3  . lisinopril (ZESTRIL) 5 MG tablet Take 1 tablet (5 mg total) by mouth at bedtime. 90 tablet 3  . nitroGLYCERIN (NITROSTAT) 0.4 MG SL tablet Place 1 tablet (0.4 mg total) under the tongue every 5 (five) minutes as needed for chest pain ((MAX of 3 doses)). 25 tablet 0  . oxymetazoline (AFRIN) 0.05 % nasal spray Place 1 spray into left nostril daily as needed (nose bleeds).    . traZODone (DESYREL) 100 MG tablet TAKE 1 TABLET BY MOUTH AT BEDTIME AS NEEDED FOR SLEEP 90 tablet 0  . atorvastatin (LIPITOR) 80 MG tablet Take 0.5 tablets (40 mg total) by mouth every  Monday, Wednesday, and Friday.    Marland Kitchen LEVEMIR FLEXTOUCH 100 UNIT/ML FlexPen INJECT 25 UNITS SUBCUTANEOUSLY ONCE DAILY AT  10PM (Patient taking differently: Inject 15 Units into the skin at bedtime. INJECT 15 UNITS SUBCUTANEOUSLY ONCE DAILY AT  10PM) 15 mL 0  . levothyroxine (SYNTHROID) 88 MCG tablet Take 1 tablet by mouth once daily 90 tablet 0  . NOVOLOG FLEXPEN 100 UNIT/ML FlexPen INJECT 5 UNITS SUBCUTANEOUSLY WITH MEALS. INJECT 10 UNITS IF MEAL TIME SUGAR IS GREATER THAN 250. 15 mL 0  . insulin detemir (LEVEMIR FLEXTOUCH) 100 UNIT/ML FlexPen Inject 15 Units into the skin daily.    Marland Kitchen NOVOLOG FLEXPEN 100 UNIT/ML FlexPen Inject 3-5 Units into the skin 3 (three) times daily with meals.     Facility-Administered Medications Prior to Visit  Medication Dose Route Frequency Provider Last Rate Last Admin  . sodium chloride flush (NS) 0.9 % injection 10 mL  10 mL Intravenous Once [name], Kathlene November, MD         Per HPI unless specifically indicated in ROS section below Review of Systems Objective:  BP 134/78   Pulse 62   Temp 97.7 F (36.5 C) (Temporal)   Ht 5\' 10"  (1.778 m)   Wt 156 lb 5 oz (70.9 kg)   SpO2 100%   BMI 22.43 kg/m   Wt Readings from Last 3 Encounters:  04/19/20 156 lb 5 oz (70.9 kg)  02/03/20 155 lb 3.2 oz (70.4 kg)  01/14/20 152 lb 9 oz (69.2 kg)      Physical Exam Vitals and nursing note reviewed.  Constitutional:      General: He is not in acute distress.    Appearance: Normal appearance. He is well-developed, underweight and well-nourished. He is not ill-appearing.  HENT:     Head: Normocephalic and atraumatic.     Right Ear: Hearing, tympanic membrane, ear canal and external ear normal.     Left Ear: Hearing, tympanic membrane, ear canal and external ear normal.     Mouth/Throat:     Mouth: Oropharynx is clear and moist and mucous membranes are normal.     Pharynx: No posterior oropharyngeal edema.  Eyes:     General: No scleral icterus.    Extraocular  Movements: Extraocular movements intact and EOM normal.     Conjunctiva/sclera: Conjunctivae normal.     Pupils: Pupils are equal, round, and reactive to light.  Neck:     Thyroid: No thyroid mass or thyromegaly.     Vascular: No carotid bruit.  Cardiovascular:     Rate and Rhythm: Normal rate and regular rhythm.     Pulses: Normal pulses and intact distal pulses.          Radial pulses are 2+ on the right side and 2+ on the left  side.     Heart sounds: Normal heart sounds. No murmur heard.   Pulmonary:     Effort: Pulmonary effort is normal. No respiratory distress.     Breath sounds: Normal breath sounds. No wheezing, rhonchi or rales.  Abdominal:     General: Abdomen is flat. Bowel sounds are normal. There is no distension.     Palpations: Abdomen is soft. There is no mass.     Tenderness: There is no abdominal tenderness. There is no guarding or rebound.     Hernia: No hernia is present.  Musculoskeletal:        General: No edema. Normal range of motion.     Cervical back: Normal range of motion and neck supple.     Right lower leg: No edema.     Left lower leg: No edema.  Lymphadenopathy:     Cervical: No cervical adenopathy.  Skin:    General: Skin is warm and dry.     Findings: No rash.  Neurological:     General: No focal deficit present.     Mental Status: He is alert and oriented to person, place, and time.     Comments: CN grossly intact, station and gait intact  Psychiatric:        Mood and Affect: Mood and affect and mood normal.        Behavior: Behavior normal.        Thought Content: Thought content normal.        Judgment: Judgment normal.       Results for orders placed or performed in visit on 04/12/20  Hepatitis C antibody  Result Value Ref Range   Hepatitis C Ab NON-REACTIVE NON-REACTI   SIGNAL TO CUT-OFF 0.01 <1.00  VITAMIN D 25 Hydroxy (Vit-D Deficiency, Fractures)  Result Value Ref Range   VITD 41.62 30.00 - 100.00 ng/mL  CBC with  Differential/Platelet  Result Value Ref Range   WBC 3.5 (L) 4.0 - 10.5 K/uL   RBC 3.77 (L) 4.22 - 5.81 Mil/uL   Hemoglobin 11.9 (L) 13.0 - 17.0 g/dL   HCT 35.2 (L) 39.0 - 52.0 %   MCV 93.4 78.0 - 100.0 fl   MCHC 33.7 30.0 - 36.0 g/dL   RDW 13.0 11.5 - 15.5 %   Platelets 130.0 (L) 150.0 - 400.0 K/uL   Neutrophils Relative % 70.1 43.0 - 77.0 %   Lymphocytes Relative 18.7 12.0 - 46.0 %   Monocytes Relative 8.4 3.0 - 12.0 %   Eosinophils Relative 2.3 0.0 - 5.0 %   Basophils Relative 0.5 0.0 - 3.0 %   Neutro Abs 2.5 1.4 - 7.7 K/uL   Lymphs Abs 0.7 0.7 - 4.0 K/uL   Monocytes Absolute 0.3 0.1 - 1.0 K/uL   Eosinophils Absolute 0.1 0.0 - 0.7 K/uL   Basophils Absolute 0.0 0.0 - 0.1 K/uL  Hemoglobin A1c  Result Value Ref Range   Hgb A1c MFr Bld 8.0 (H) 4.6 - 6.5 %  TSH  Result Value Ref Range   TSH 1.80 0.35 - 4.50 uIU/mL  Comprehensive metabolic panel  Result Value Ref Range   Sodium 141 135 - 145 mEq/L   Potassium 5.0 3.5 - 5.1 mEq/L   Chloride 104 96 - 112 mEq/L   CO2 29 19 - 32 mEq/L   Glucose, Bld 175 (H) 70 - 99 mg/dL   BUN 39 (H) 6 - 23 mg/dL   Creatinine, Ser 1.56 (H) 0.40 - 1.50 mg/dL   Total Bilirubin  0.6 0.2 - 1.2 mg/dL   Alkaline Phosphatase 83 39 - 117 U/L   AST 20 0 - 37 U/L   ALT 20 0 - 53 U/L   Total Protein 6.5 6.0 - 8.3 g/dL   Albumin 4.4 3.5 - 5.2 g/dL   GFR 41.88 (L) >60.00 mL/min   Calcium 10.2 8.4 - 10.5 mg/dL  Lipid panel  Result Value Ref Range   Cholesterol 179 0 - 200 mg/dL   Triglycerides 198.0 (H) 0.0 - 149.0 mg/dL   HDL 40.40 >39.00 mg/dL   VLDL 39.6 0.0 - 40.0 mg/dL   LDL Cholesterol 99 0 - 99 mg/dL   Total CHOL/HDL Ratio 4    NonHDL 138.15    Assessment & Plan:  This visit occurred during the SARS-CoV-2 public health emergency.  Safety protocols were in place, including screening questions prior to the visit, additional usage of staff PPE, and extensive cleaning of exam room while observing appropriate contact time as indicated for  disinfecting solutions.   Problem List Items Addressed This Visit    Pancytopenia ([place])    Mild, will watch. Known CLL.      Hypothyroidism    Chronic, stable on levothyroxine 54mcg daily.       Relevant Medications   levothyroxine (SYNTHROID) 88 MCG tablet   Hypertension    Chronic, stable on current regimen of lisinopril carvedilol and amlodipine.       Relevant Medications   atorvastatin (LIPITOR) 80 MG tablet   Hyperlipidemia associated with type 2 diabetes mellitus ([place])    Overall doing well on atorvastatin 80mg  MWF - continue. The 10-year ASCVD risk score Mikey Bussing DC Brooke Bonito., et al., 2013) is: 61.1%   Values used to calculate the score:     Age: 83 years     Sex: Male     Is Non-Hispanic African American: No     Diabetic: Yes     Tobacco smoker: No     Systolic Blood Pressure: 732 mmHg     Is BP treated: Yes     HDL Cholesterol: 40.4 mg/dL     Total Cholesterol: 179 mg/dL       Relevant Medications   insulin detemir (LEVEMIR FLEXTOUCH) 100 UNIT/ML FlexPen   atorvastatin (LIPITOR) 80 MG tablet   NOVOLOG FLEXPEN 100 UNIT/ML FlexPen   Controlled diabetes mellitus with stage 3 chronic kidney disease, with long-term current use of insulin (HCC) - Primary    Chronic brittle diabetes. Pending establishing with endo.  Reviewed insulin dosing - currently on levemir 15u daily with novolog 3-5u with meals. Reviewed recent sugar trends via CGM.       Relevant Medications   insulin detemir (LEVEMIR FLEXTOUCH) 100 UNIT/ML FlexPen   atorvastatin (LIPITOR) 80 MG tablet   NOVOLOG FLEXPEN 100 UNIT/ML FlexPen   CLL (chronic lymphocytic leukemia) (HCC)    Appreciate onc care.      CKD (chronic kidney disease) stage 3, GFR 30-59 ml/min (HCC)    Chronic, stable. Continue current regimen.       Carotid stenosis, asymptomatic, bilateral    Update carotid US as overdue.       Relevant Medications   atorvastatin (LIPITOR) 80 MG tablet   Other Relevant Orders   VAS US CAROTID    Acquired hand deformity       Meds ordered this encounter  Medications  . atorvastatin (LIPITOR) 80 MG tablet    Sig: Take 1 tablet (80 mg total) by mouth every Monday, Wednesday, and Friday.  Dispense:  39 tablet    Refill:  3  . levothyroxine (SYNTHROID) 88 MCG tablet    Sig: Take 1 tablet (88 mcg total) by mouth daily.    Dispense:  90 tablet    Refill:  3   No orders of the defined types were placed in this encounter.   Patient instructions: If interested, check with pharmacy about new 2 shot shingles series (shingrix).  Continue current medicines including lower dose of levemir 15u daily.  Increase water intake for kidneys.  Return as needed or in 4 months for follow up visit.   Follow up plan: Return in about 4 months (around 08/17/2020) for follow up visit.  [name], MD

## 2020-04-19 NOTE — Assessment & Plan Note (Addendum)
Chronic brittle diabetes. Pending establishing with endo.  Reviewed insulin dosing - currently on levemir 15u daily with novolog 3-5u with meals. Reviewed recent sugar trends via CGM.

## 2020-04-19 NOTE — Assessment & Plan Note (Signed)
Chronic, stable. Continue current regimen. 

## 2020-04-19 NOTE — Assessment & Plan Note (Signed)
Chronic, stable on levothyroxine 88mcg daily.  ?

## 2020-04-19 NOTE — Assessment & Plan Note (Signed)
Appreciate onc care.  

## 2020-04-19 NOTE — Assessment & Plan Note (Signed)
Overall doing well on atorvastatin 80mg  MWF - continue. The 10-year ASCVD risk score Mikey Bussing DC Brooke Bonito., et al., 2013) is: 61.1%   Values used to calculate the score:     Age: 80 years     Sex: Male     Is Non-Hispanic African American: No     Diabetic: Yes     Tobacco smoker: No     Systolic Blood Pressure: 244 mmHg     Is BP treated: Yes     HDL Cholesterol: 40.4 mg/dL     Total Cholesterol: 179 mg/dL

## 2020-04-19 NOTE — Assessment & Plan Note (Addendum)
Mild, will watch. Known CLL.

## 2020-05-01 ENCOUNTER — Other Ambulatory Visit: Payer: Self-pay

## 2020-05-01 ENCOUNTER — Other Ambulatory Visit: Payer: Medicare Other

## 2020-05-01 VITALS — BP 116/58 | HR 59 | Temp 98.8°F | Resp 20

## 2020-05-01 DIAGNOSIS — Z515 Encounter for palliative care: Secondary | ICD-10-CM

## 2020-05-01 NOTE — Progress Notes (Signed)
COMMUNITY PALLIATIVE CARE SW NOTE  PATIENT NAME: Scott [name] DOB: 03/23/1940 MRN: [id number]  PRIMARY CARE PROVIDER: [name], MD  RESPONSIBLE PARTY:  Acct ID - Guarantor Home Phone Work Phone Relationship Acct Type  [id number] - [name],[name]  Self P/F     [place], Scott [name], Scott [name] [address]     PLAN OF CARE and INTERVENTIONS:             1. GOALS OF CARE/ ADVANCE CARE PLANNING:  Patient is a FULL CODE. Patient's son has HCPOA. Patient's goal is to remain at home independently and continue to manage medical conditions.  2. SOCIAL/EMOTIONAL/SPIRITUAL ASSESSMENT/ INTERVENTIONS:  SW and RN Scott [name] met with patient and patient's spouse in patients home. Patient lives in a one story modular home.   Patient updated SW and RN medical condition and changes. Patient had a brief ER visit in Dec due to BP rising. Patient shared that he had been working out in his yard and his BP rose. Patient shared that he has been feeling fine since then and monitoring his BP's patient state that his PCP did not change any of his medications due to this. Patient shared that he eats well, recent weight was 156.5lbs on 2/16. Patient shares that he sleeps well. No recent falls reported.   Patient denies any pain other than minimal back pain in the morning which is resolved by taking a Tylenol in the mornings which carries him throughout the day.   RN reviewed medications and took vitals. Patient checks his BS and BP daily. No medication changes. Patient has upcoming follow up appointment with Dr. Grayland [name], oncology and a new diabetic doctor in the coming weeks. Patient is needing to find a new cardiologist, due to current cardiologist moving out of state. Patient will outreach Dr. Donivan [name] office to get established with their office.  SW discussed goals, reviewed care plan, provided emotional support, used active and reflective listening. Palliative care will continue to monitor and assist with  long term care planning as needed.   3. PATIENT/CAREGIVER EDUCATION/ COPING:  Patient A&O x4. Patient is able to engage in linear conversation and answer all questions appropriately. Patients family and spouse is supportive.  4. PERSONAL EMERGENCY PLAN:  Patient will call 9-1-1 for emergencies.   5. COMMUNITY RESOURCES COORDINATION/ HEALTH CARE NAVIGATION:  Patient manages his own care.  6. FINANCIAL/LEGAL CONCERNS/INTERVENTIONS:  None.     SOCIAL HX:  Social History   Tobacco Use  . Smoking status: Former Smoker    Quit date: 03/04/1978    Years since quitting: 42.1  . Smokeless tobacco: Never Used  Substance Use Topics  . Alcohol use: No    CODE STATUS: Full code ADVANCED DIRECTIVES: Y MOST FORM COMPLETE: N HOSPICE EDUCATION PROVIDED: N  PPS: Patient is independent with all ADL's and is able to drive.    Time spent: 1hr.       Scott Eland, LCSW

## 2020-05-01 NOTE — Progress Notes (Signed)
PATIENT NAME: Scott [name] DOB: 05-10-1940 MRN: [id number]  PRIMARY CARE PROVIDER: [name], MD  RESPONSIBLE PARTY:  Acct ID - Guarantor Home Phone Work Phone Relationship Acct Type  [id number] - Steichen,[name]  Self P/F     [place], [place], [place] [address]    PLAN OF CARE and INTERVENTIONS:               1.  GOALS OF CARE/ ADVANCE CARE PLANNING:  Patient desires to remain home and independent with the assistance of his companion.               2.  PATIENT/CAREGIVER EDUCATION:  Hypo/hyperglycemia symptoms.               4. PERSONAL EMERGENCY PLAN:  Activate 911 for emergencies.               5.  DISEASE STATUS:  Joint visit with [place], SW, patient and companion.  Patient states he is doing well overall.  He does have some upcoming visits this week.    Blood sugars reviewed and patient is ranging from 82-291.  He is checking his blood sugars regularly.  Reviewed s/s of hypo/hyperglycemia with patient.  Patient denies any issues with insomnia.  Patient denies fatigue, nausea, or vomiting.    Patient is independent with adl's.  He denies any recent falls.  Reviewed medications with patient and he is taking medications as directed.  Patient notes that his cardiologist is leaving the practice to move out of state.  I have recommended [facility] as patient desires to remain locally.  I have encouraged him to schedule an appointment soon to get established with a new cardiologist.  Patient reports occasional issues with chronic back pain but he is taking tylenol PRN.  This has been effective in managing his pain.   HISTORY OF PRESENT ILLNESS:  80 year old male with Diabetes Type 2 and CCL.  Patient is being followed by Palliative Care monthly and PRN.  CODE STATUS: Full ADVANCED DIRECTIVES: Yes MOST FORM: No PPS: 60%   PHYSICAL EXAM:   VITALS:  Temp 98,8 F, BP 116/58, P 59 R 20 O2 sats 94% LUNGS:  CTA denies shortness of breath. CARDIAC:   HRR EXTREMITIES: no edema present.  SKIN: warm and dry to touch.  No skin breakdown. NEURO: alert and oriented x 3.       Lorenza Burton, RN

## 2020-05-02 ENCOUNTER — Other Ambulatory Visit: Payer: Self-pay | Admitting: Family Medicine

## 2020-05-04 DIAGNOSIS — N183 Chronic kidney disease, stage 3 unspecified: Secondary | ICD-10-CM | POA: Diagnosis not present

## 2020-05-04 DIAGNOSIS — Z794 Long term (current) use of insulin: Secondary | ICD-10-CM | POA: Diagnosis not present

## 2020-05-04 DIAGNOSIS — E1122 Type 2 diabetes mellitus with diabetic chronic kidney disease: Secondary | ICD-10-CM | POA: Diagnosis not present

## 2020-05-04 DIAGNOSIS — E782 Mixed hyperlipidemia: Secondary | ICD-10-CM | POA: Diagnosis not present

## 2020-05-04 DIAGNOSIS — E063 Autoimmune thyroiditis: Secondary | ICD-10-CM | POA: Diagnosis not present

## 2020-05-04 DIAGNOSIS — I10 Essential (primary) hypertension: Secondary | ICD-10-CM | POA: Diagnosis not present

## 2020-05-05 ENCOUNTER — Other Ambulatory Visit: Payer: Self-pay | Admitting: Family Medicine

## 2020-05-05 ENCOUNTER — Ambulatory Visit (INDEPENDENT_AMBULATORY_CARE_PROVIDER_SITE_OTHER): Payer: Medicare Other

## 2020-05-05 ENCOUNTER — Other Ambulatory Visit: Payer: Self-pay

## 2020-05-05 DIAGNOSIS — I6523 Occlusion and stenosis of bilateral carotid arteries: Secondary | ICD-10-CM

## 2020-05-05 NOTE — Telephone Encounter (Signed)
Patient came in office requesting medication refills

## 2020-05-06 NOTE — Telephone Encounter (Signed)
ERx 

## 2020-05-08 ENCOUNTER — Encounter: Payer: Self-pay | Admitting: Oncology

## 2020-05-08 ENCOUNTER — Inpatient Hospital Stay: Payer: Medicare Other | Attending: Oncology

## 2020-05-08 ENCOUNTER — Inpatient Hospital Stay (HOSPITAL_BASED_OUTPATIENT_CLINIC_OR_DEPARTMENT_OTHER): Payer: Medicare Other | Admitting: Oncology

## 2020-05-08 VITALS — BP 174/90 | HR 64 | Temp 97.8°F | Resp 20 | Wt 160.5 lb

## 2020-05-08 DIAGNOSIS — D649 Anemia, unspecified: Secondary | ICD-10-CM

## 2020-05-08 DIAGNOSIS — I6523 Occlusion and stenosis of bilateral carotid arteries: Secondary | ICD-10-CM | POA: Diagnosis not present

## 2020-05-08 DIAGNOSIS — N183 Chronic kidney disease, stage 3 unspecified: Secondary | ICD-10-CM | POA: Insufficient documentation

## 2020-05-08 DIAGNOSIS — D693 Immune thrombocytopenic purpura: Secondary | ICD-10-CM | POA: Insufficient documentation

## 2020-05-08 DIAGNOSIS — Z79899 Other long term (current) drug therapy: Secondary | ICD-10-CM | POA: Insufficient documentation

## 2020-05-08 DIAGNOSIS — Z794 Long term (current) use of insulin: Secondary | ICD-10-CM | POA: Insufficient documentation

## 2020-05-08 DIAGNOSIS — Z87891 Personal history of nicotine dependence: Secondary | ICD-10-CM | POA: Diagnosis not present

## 2020-05-08 DIAGNOSIS — C9110 Chronic lymphocytic leukemia of B-cell type not having achieved remission: Secondary | ICD-10-CM | POA: Insufficient documentation

## 2020-05-08 DIAGNOSIS — E039 Hypothyroidism, unspecified: Secondary | ICD-10-CM | POA: Insufficient documentation

## 2020-05-08 LAB — CBC WITH DIFFERENTIAL/PLATELET
Eosinophils Absolute: 0.1 K/uL (ref 0.0–0.5)
Eosinophils Relative: 2 %
HCT: 34.5 % — ABNORMAL LOW (ref 39.0–52.0)
Hemoglobin: 11.2 g/dL — ABNORMAL LOW (ref 13.0–17.0)
Lymphocytes Relative: 19 %
Lymphs Abs: 0.6 K/uL — ABNORMAL LOW (ref 0.7–4.0)
MCH: 31.1 pg (ref 26.0–34.0)
MCHC: 32.5 g/dL (ref 30.0–36.0)
MCV: 95.8 fL (ref 80.0–100.0)
Monocytes Absolute: 0.3 K/uL (ref 0.1–1.0)
Monocytes Relative: 7 %
Neutro Abs: 2.4 K/uL (ref 1.7–7.7)
Neutrophils Relative %: 71 %
Platelets: 124 K/uL — ABNORMAL LOW (ref 150–400)
RBC: 3.6 MIL/uL — ABNORMAL LOW (ref 4.22–5.81)
RDW: 12.1 % (ref 11.5–15.5)
WBC: 3.4 K/uL — ABNORMAL LOW (ref 4.0–10.5)

## 2020-05-08 LAB — COMPREHENSIVE METABOLIC PANEL WITH GFR
ALT: 21 U/L (ref 0–44)
AST: 23 U/L (ref 15–41)
Albumin: 4.3 g/dL (ref 3.5–5.0)
Alkaline Phosphatase: 73 U/L (ref 38–126)
Anion gap: 10 (ref 5–15)
BUN: 40 mg/dL — ABNORMAL HIGH (ref 8–23)
CO2: 25 mmol/L (ref 22–32)
Calcium: 9.7 mg/dL (ref 8.9–10.3)
Chloride: 106 mmol/L (ref 98–111)
Creatinine, Ser: 1.39 mg/dL — ABNORMAL HIGH (ref 0.61–1.24)
GFR, Estimated: 52 mL/min — ABNORMAL LOW
Glucose, Bld: 271 mg/dL — ABNORMAL HIGH (ref 70–99)
Potassium: 5.1 mmol/L (ref 3.5–5.1)
Sodium: 141 mmol/L (ref 135–145)
Total Bilirubin: 0.5 mg/dL (ref 0.3–1.2)
Total Protein: 7 g/dL (ref 6.5–8.1)

## 2020-05-08 LAB — SAMPLE TO BLOOD BANK

## 2020-05-08 NOTE — Progress Notes (Signed)
[place] [place]  [phone] [phone]  ID: Scott [name] OB: 11/10/1940  MR#: [id number]  TOI#:[id number]  Patient Care Team: [name], MD as PCP - General (Family Medicine) Dorothy Spark, MD as PCP - Cardiology (Cardiology) Lloyd Huger, MD as Consulting Physician (Oncology) Clent Jacks, MD as Consulting Physician (Ophthalmology) Dorothy Spark, MD as Consulting Physician (Cardiology) Madelon Lips, MD as Consulting Physician (Nephrology)   CHIEF COMPLAINT: 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  INTERVAL HISTORY: Patient returns to clinic today for repeat laboratory can routine 25-monthevaluation.   He was last seen in clinic on 02/03/2020.   He continues to feel well and remains asymptomatic.  He denies any weakness or fatigue.  His appetite is great and he has gained some weight.  He denies any recent fevers or illnesses.  He has no neurologic complaints.  He has a good appetite and denies weight loss. He denies any night sweats. He has noted no lymphadenopathy.  He has no chest pain, shortness of breath, cough, or hemoptysis.  He denies any nausea, vomiting, constipation, or diarrhea.  He denies any melena or hematochezia.  He has no urinary complaints.  Patient offers no specific complaints today.  REVIEW OF SYSTEMS:   Review of Systems  Constitutional: Negative.  Negative for fever, malaise/fatigue and weight loss.  HENT: Negative.  Negative for nosebleeds.   Respiratory: Negative.  Negative for cough and shortness of breath.   Cardiovascular: Negative.  Negative for chest pain and leg swelling.  Gastrointestinal: Negative.  Negative for abdominal pain, blood in stool and melena.  Genitourinary: Negative.  Negative for dysuria and hematuria.  Musculoskeletal: Negative.  Negative for back pain and neck pain.  Skin: Negative.  Negative for itching and rash.  Neurological: Negative.  Negative for dizziness,  sensory change, focal weakness, weakness and headaches.  Endo/Heme/Allergies: Negative.  Does not bruise/bleed easily.  Psychiatric/Behavioral: Negative.  The patient is not nervous/anxious.     As per HPI. Otherwise, a complete review of systems is negative.  PAST MEDICAL HISTORY: Past Medical History:  Diagnosis Date  . Acquired hand deformity 1962   hand saw accident at work  . Anemia 10/11/2011  . Arthritis   . Bradycardia 10/10/2011  . CAD (coronary artery disease) 10/10/2011   MI s/p PTCA (Dx-OM2 proximal concentric stenosis)  . [place] artery disease [place]   . Cellulitis of buttock, left 11/17/2018  . Chronic edema   . CKD (chronic kidney disease) stage 3, GFR 30-59 ml/min (HCC)    [name]  . C[place] ([place]ocytic [place]) [place]   . Colon polyps   . Cough 04/28/2019  . Generalized headaches    frequent  . GI bleed 07/08/2017  . Glaucoma    s/p laser surgery  . HLD (hyperlipidemia)   . Hyperkalemia 02/28/2018  . Hypertension   . Hypothyroidism 10/10/2011  . Iron deficiency anemia due to chronic blood loss   . Squamous cell carcinoma of skin 06/15/2019   Right posterior ear. SCCis, hypertrophic, crusted  . [place]rombocyt[place] ([place] 10/11/2011  . Type 2 diabetes with nephropathy ([facility]    DM refresher course [facility] (04/2013)    PAST SURGICAL HISTORY: Past Surgical History:  Procedure Laterality Date  . BACK SURGERY     cervical neck  . CATARACT EXTRACTION, BILATERAL Bilateral 2017  . COLONOSCOPY  11/2008   1 polyp, diverticulosis, rec rpt 5 yrs (Dr. EOletta Lamas ESadie Haber  . COLONOSCOPY  06/2014   hyperplastic polyp, rpt  5 yrs Oletta Lamas)  . COLONOSCOPY WITH PROPOFOL N/A 11/17/2017   TA, HP, (Vanga, Tally Due, MD)  . ESOPHAGOGASTRODUODENOSCOPY (EGD) WITH PROPOFOL N/A 11/17/2017   healing erosive gastritis, intestinal metaplasia, neg H pylori (Vanga, Tally Due, MD)  . ESOPHAGOGASTRODUODENOSCOPY (EGD) WITH PROPOFOL N/A 06/28/2019   erosive gastropathy with stigmata of recent  bleed, granular tissue in esophagus biopsied- reflux esophagitis, small HH (Vanga, Tally Due, MD)  . EYE SURGERY  2012   laser surgery for glaucoma  . PORTACATH PLACEMENT Left 12/09/2018   Procedure: INSERTION PORT-A-CATH;  Surgeon: [name], MD;  Location: [facility] ORS;  Service: General;  Laterality: Left;  . PTCA  1994, 1995  . US ECHOCARDIOGRAPHY  10/2013   inferior wall hypokinesis, mild LVH, EF 50-55%, mild MR and LA dilation    FAMILY HISTORY: Family History  Problem Relation Age of Onset  . Diabetes Sister   . Stomach cancer Sister 57  . Pneumonia Father        caused death  . Other Brother        no communication with brother so unsure of any health conditions  . Coronary artery disease Son 88       5v CABG and stents  . Hyperlipidemia Sister   . Stroke Neg Hx   . Heart attack Neg Hx     ADVANCED DIRECTIVES (Y/N):  N  HEALTH MAINTENANCE: Social History   Tobacco Use  . Smoking status: Former Smoker    Quit date: 03/04/1978    Years since quitting: 42.2  . Smokeless tobacco: Never Used  Vaping Use  . Vaping Use: Never used  Substance Use Topics  . Alcohol use: No  . Drug use: No     Colonoscopy:  PAP:  Bone density:  Lipid panel:  No Known Allergies  Current Outpatient Medications  Medication Sig Dispense Refill  . acetaminophen (TYLENOL) 650 MG CR tablet Take 1,300 mg by mouth 2 (two) times daily.    Marland Kitchen amLODipine (NORVASC) 5 MG tablet Take 1 tablet (5 mg total) by mouth daily. 90 tablet 3  . atorvastatin (LIPITOR) 80 MG tablet Take 1 tablet (80 mg total) by mouth every Monday, Wednesday, and Friday. 39 tablet 3  . carvedilol (COREG) 6.25 MG tablet Take 1 tablet (6.25 mg total) by mouth 2 (two) times daily with a meal. 180 tablet 3  . Continuous Blood Gluc Receiver (FREESTYLE LIBRE 14 DAY READER) DEVI Use as directed to check sugars QID AC HS 2 each 3  . Continuous Blood Gluc Sensor (FREESTYLE LIBRE 14 DAY SENSOR) MISC USE AS DIRECTED TO  CHECK  SUGARS   FOUR  TIMES  DAILY  BEFORE  MEALS  AND  AT  BEDTIME 2 each 3  . insulin detemir (LEVEMIR FLEXTOUCH) 100 UNIT/ML FlexPen Inject 16 Units into the skin daily. 15 mL 3  . levothyroxine (SYNTHROID) 88 MCG tablet Take 1 tablet (88 mcg total) by mouth daily. 90 tablet 3  . lisinopril (ZESTRIL) 5 MG tablet Take 1 tablet (5 mg total) by mouth at bedtime. 90 tablet 3  . nitroGLYCERIN (NITROSTAT) 0.4 MG SL tablet Place 1 tablet (0.4 mg total) under the tongue every 5 (five) minutes as needed for chest pain ((MAX of 3 doses)). 25 tablet 0  . NOVOLOG FLEXPEN 100 UNIT/ML FlexPen Inject 5-8 Units into the skin 3 (three) times daily with meals. 30 mL 3  . oxymetazoline (AFRIN) 0.05 % nasal spray Place 1 spray into left nostril daily as needed (nose  bleeds).    . traZODone (DESYREL) 100 MG tablet TAKE 1 TABLET BY MOUTH AT BEDTIME AS NEEDED FOR SLEEP 90 tablet 0   No current facility-administered medications for this visit.   Facility-Administered Medications Ordered in Other Visits  Medication Dose Route Frequency Provider Last Rate Last Admin  . sodium chloride flush (NS) 0.9 % injection 10 mL  10 mL Intravenous Once Lloyd Huger, MD        OBJECTIVE: Vitals:   05/08/20 1127  BP: (!) 186/85  Pulse: 64  Resp: 20  Temp: 97.8 F (36.6 C)  SpO2: 100%     Body mass index is 23.03 kg/m.    ECOG FS:0 - Asymptomatic  Physical Exam Constitutional:      General: Vital signs are normal.     Appearance: Normal appearance.  HENT:     Head: Normocephalic and atraumatic.  Eyes:     Pupils: Pupils are equal, round, and reactive to light.  Cardiovascular:     Rate and Rhythm: Normal rate and regular rhythm.     Heart sounds: Normal heart sounds. No murmur heard.   Pulmonary:     Effort: Pulmonary effort is normal.     Breath sounds: Normal breath sounds. No wheezing.  Abdominal:     General: Bowel sounds are normal. There is no distension.     Palpations: Abdomen is soft.     Tenderness: There  is no abdominal tenderness.  Musculoskeletal:        General: No edema. Normal range of motion.     Cervical back: Normal range of motion.  Skin:    General: Skin is warm and dry.     Findings: No rash.  Neurological:     Mental Status: He is alert and oriented to person, place, and time.  Psychiatric:        Judgment: Judgment normal.      LAB RESULTS:  Lab Results  Component Value Date   NA 141 04/12/2020   K 5.0 04/12/2020   CL 104 04/12/2020   CO2 29 04/12/2020   GLUCOSE 175 (H) 04/12/2020   BUN 39 (H) 04/12/2020   CREATININE 1.56 (H) 04/12/2020   CALCIUM 10.2 04/12/2020   PROT 6.5 04/12/2020   ALBUMIN 4.4 04/12/2020   AST 20 04/12/2020   ALT 20 04/12/2020   ALKPHOS 83 04/12/2020   BILITOT 0.6 04/12/2020   GFRNONAA 43 (L) 03/01/2020   GFRAA 42 (L) 10/28/2019    Lab Results  Component Value Date   WBC 3.5 (L) 04/12/2020   NEUTROABS 2.5 04/12/2020   HGB 11.9 (L) 04/12/2020   HCT 35.2 (L) 04/12/2020   MCV 93.4 04/12/2020   PLT 130.0 (L) 04/12/2020     STUDIES: VAS US CAROTID  Result Date: 05/05/2020 Carotid Arterial Duplex Study Indications:       Left bruit and One year. Risk Factors:      Hypertension, hyperlipidemia, Diabetes, past history of                    smoking, coronary artery disease. Other Factors:     Patient denies cerebrovascular symptoms. Comparison Study:  03/26/18 carotid duplex exam showed RICA velocities of 102/29                    cm/s and LICA of 527/78 cm/s Performing Technologist: Pilar Jarvis RDMS, RVT, RDCS  Examination Guidelines: A complete evaluation includes B-mode imaging, spectral Doppler, color Doppler, and power Doppler as  needed of all accessible portions of each vessel. Bilateral testing is considered an integral part of a complete examination. Limited examinations for reoccurring indications may be performed as noted.  Right Carotid Findings: +----------+--------+--------+--------+------------------+--------+           PSV  cm/sEDV cm/sStenosisPlaque DescriptionComments +----------+--------+--------+--------+------------------+--------+ CCA Prox  94      17                                         +----------+--------+--------+--------+------------------+--------+ CCA Distal63      15              focal and calcific         +----------+--------+--------+--------+------------------+--------+ ICA Prox  77      18              focal and calcific         +----------+--------+--------+--------+------------------+--------+ ICA Mid   85      21                                         +----------+--------+--------+--------+------------------+--------+ ICA Distal88      24                                         +----------+--------+--------+--------+------------------+--------+ ECA       44      11                                         +----------+--------+--------+--------+------------------+--------+ +----------+--------+-------+---------+-------------------+           PSV cm/sEDV cmsDescribe Arm Pressure (mmHG) +----------+--------+-------+---------+-------------------+ Subclavian230            Turbulent128                 +----------+--------+-------+---------+-------------------+ +---------+--------+--+--------+---------+ VertebralPSV cm/s65EDV cm/sAntegrade +---------+--------+--+--------+---------+  Left Carotid Findings: +----------+--------+--------+--------+-------------------------------+--------+           PSV cm/sEDV cm/sStenosisPlaque Description             Comments +----------+--------+--------+--------+-------------------------------+--------+ CCA Prox  81      15                                                      +----------+--------+--------+--------+-------------------------------+--------+ CCA Distal126     34              diffuse, calcific and irregular          +----------+--------+--------+--------+-------------------------------+--------+ ICA Prox  218     46              diffuse, calcific and irregular         +----------+--------+--------+--------+-------------------------------+--------+ ICA Mid   58      17                                                      +----------+--------+--------+--------+-------------------------------+--------+  ICA Distal93      39                                                      +----------+--------+--------+--------+-------------------------------+--------+ ECA       69      12                                                      +----------+--------+--------+--------+-------------------------------+--------+ +----------+--------+--------+----------------+-------------------+           PSV cm/sEDV cm/sDescribe        Arm Pressure (mmHG) +----------+--------+--------+----------------+-------------------+ [id number]              Multiphasic, [id number]                 +----------+--------+--------+----------------+-------------------+ +---------+--------+--+--------+--+ VertebralPSV cm/s63EDV cm/s20 +---------+--------+--+--------+--+   Summary: Right Carotid: Velocities in the right ICA are consistent with a 1-39% stenosis.                Non-hemodynamically significant plaque <50% noted in the CCA. The                ECA appears <50% stenosed. Left Carotid: Velocities in the left ICA are consistent with a 40-59% stenosis.               Non-hemodynamically significant plaque <50% noted in the CCA. The               ECA appears <50% stenosed. Vertebrals:  Bilateral vertebral arteries demonstrate antegrade flow. Subclavians: Right subclavian artery flow was disturbed. Normal flow              hemodynamics were seen in the left subclavian artery. *See table(s) above for measurements and observations. Suggest follow up study in 12 months.    Preliminary     ASSESSMENT: CLL with ATM (11q-)  mutation and 13q-, anemia, ITP.  Oncology history: Repeat bone marrow biopsy on Jul 28, 2018 reviewed independently with 73% involvement of CLL.  Previous bone marrow biopsy on October 23, 2017 revealed only 31% involvement.  Previously, peripheral blood FISH testing 50% incidence of mutation in the ATM gene which is commonly associated with deletion 11q. 11q- is associated with an unfavorable prognosis and high risk of not responding to initial treatment. 13q- is a more common mutation and is actually associated with a more favorable prognosis. Patient had clear progression of disease in his bone marrow along with a worsening transfusion requirement.  He received 5 weekly cycles of Rituxan with mild improvement of his platelet count.  Patient then received 4 cycles of Rituxan plus Treanda completing treatment on January 12, 2019.  PLAN:    1.  CLL: -She is status post 5 weekly cycles of Rituxan with improvement of his counts. -Received 4 cycles of Rituxan plus Treanda completing treatment on 01/12/2019. -Labs from 05/08/2020 show a hemoglobin of 11.2, platelet count 124, WBC 3.4 and ANC 2.4. -No intervention is needed at this time. -If patient required retreatment, would consider Rituxan plus Treanda once again.  -Return to clinic in 3 months with repeat laboratory work and further evaluation.    2.  Thrombocytopenia:  -Chronic and unchanged.   -  Patient's platelet count is 124 today.   -Received high-dose prednisone, IVIG, and Rituxan with no appreciable durability to improve his platelet count.   -Platelets improved upon treatment of his CLL.  3.  Anemia: -Chronic and unchanged.  -Patient's hemoglobin is 11.2 today.   -His last blood transfusion was given on December 03, 2018.   -Patient had colonoscopy on November 17, 2017 that removed 2 nonmalignant polyps.  -EGD on the same day revealed nonbleeding erosive gastropathy with multiple nonbleeding duodenal ulcers.   -Repeat EGD did not  reveal significant pathology.   -Continue follow-up with GI as scheduled.  Disposition: -RTC in 3 months with repeat labs and MD assessment.  Greater than 50% was spent in counseling and coordination of care with this patient including but not limited to discussion of the relevant topics above (See A&P) including, but not limited to diagnosis and management of acute and chronic medical conditions.    Patient expressed understanding and was in agreement with this plan. He also understands that He can call clinic at any time with any questions, concerns, or complaints.    Jacquelin Hawking, NP   05/08/2020 11:30 AM

## 2020-05-19 ENCOUNTER — Other Ambulatory Visit: Payer: Self-pay | Admitting: Family Medicine

## 2020-05-26 ENCOUNTER — Telehealth: Payer: Self-pay

## 2020-05-26 NOTE — Telephone Encounter (Signed)
1150AM: Palliative care SW outreached patient for monthly telephonic visit.  SW left HIPPA complaint VM. Awaiting return call.  Will continue to offer palliative care support.

## 2020-06-05 ENCOUNTER — Telehealth: Payer: Self-pay

## 2020-06-05 NOTE — Progress Notes (Signed)
Cardiology Office Note  Date:  06/06/2020   ID:  Scott [name], DOB 02/27/1941, MRN [id number]  PCP:  [name], MD   Chief Complaint  Patient presents with  . 12 month follow up     Patient c/o shortness of breath. Medications reviewed by the patient verbally.     HPI:  Scott [name] is a 80 y.o. male with history of  CAD s/p MI s/p remote PTCA (Dx-OM2 proximal concentric stenosis),  Diabetes with peripheral nephropathy,  CKD stage III (sees CKA),  bradycardia,  CLL,  carotid artery disease by duplex 03/2018 (0-35% RICA, 46-56% LICA),  chronic mild edema, HTN, orthostasis  who presents to establish care in the [place] office, follow-up of his coronary disease  In follow-up today reports he is doing well  Feels well,  Works in yard, [employer], Data processing manager,  Gets tired, has to sit down  Dx with leukemia, followed by Dr. [name] Multiple transfusions, Rx HGB stable, 10 to 12 the past year  Lost wife 5 yrs ago, Friend cooks for him potatos  Feb 2022 A1C 8.0 Total chol 179, LDL 99, was off lipitor at the time (80)  Now on lipitor 40 three days a week  Uses treadmill at home  Carotid 05/2020 Carotid US 10/1273: RICA 1-70%, LICA 01-74% stenosis,   EKG personally reviewed by myself on todays visit NSr rate 64 bpm, no ST or T wave changes  Other past medical history reviewed Remote PTCA 1994, chronic reocclusion distal right with collaterals from circumflex, and 08/24/1993 PTCA of Dx-OM2 proximal concentric stenosis.   2013 admitted for chest pain, ruled out for ACS and an exercise stress test was negative for ischemia ,  fixed,  apical thinning.  Echo was unremarkable  echocardiogram in 10/2013 showed inferior wall hypokinesis, EF 50-55%, mild mitral regurgitation, mild LAE.   nuclear stress test 11/2015  low risk with mild apical thinning and mild apical ischemia, EF 58 with normal wall motion.  CLL, severely low platelets  beta blocker was previously reduced  due to bradycardia.   Aspirin was stopped due to low platelets and frequent nosebleeds.   Last labs 12/2018 showed Hgb 9.0, plt 68 (as low as 33), K 4.1, Cr 1.36, normal AST/ALT, 06/2018 A1C 7.5, TSH elevated but free T4 normal (managed by primary care), 02/2018 LDL 65.    PMH:   has a past medical history of Acquired hand deformity (1962), Anemia (10/11/2011), Arthritis, Bradycardia (10/10/2011), CAD (coronary artery disease) (10/10/2011), Carotid artery disease ([place]), Cellulitis of buttock, left (11/17/2018), Chronic edema, CKD (chronic kidney disease) stage 3, GFR 30-59 ml/min (HCC), CLL (chronic lymphocytic leukemia) ([place]), Colon polyps, Cough (04/28/2019), Generalized headaches, GI bleed (07/08/2017), Glaucoma, HLD (hyperlipidemia), Hyperkalemia (02/28/2018), Hypertension, Hypothyroidism (10/10/2011), Iron deficiency anemia due to chronic blood loss, Squamous cell carcinoma of skin (06/15/2019), Thrombocytopenia ([place]) (10/11/2011), and Type 2 diabetes with nephropathy ([place]).  PSH:    Past Surgical History:  Procedure Laterality Date  . BACK SURGERY     cervical neck  . CATARACT EXTRACTION, BILATERAL Bilateral 2017  . COLONOSCOPY  11/2008   1 polyp, diverticulosis, rec rpt 5 yrs (Dr. Oletta Lamas, [name])  . COLONOSCOPY  06/2014   hyperplastic polyp, rpt 5 yrs ([place])  . COLONOSCOPY WITH PROPOFOL N/A 11/17/2017   TA, HP, (Vanga, Tally Due, MD)  . ESOPHAGOGASTRODUODENOSCOPY (EGD) WITH PROPOFOL N/A 11/17/2017   healing erosive gastritis, intestinal metaplasia, neg H pylori (Vanga, Tally Due, MD)  . ESOPHAGOGASTRODUODENOSCOPY (EGD) WITH PROPOFOL N/A 06/28/2019  erosive gastropathy with stigmata of recent bleed, granular tissue in esophagus biopsied- reflux esophagitis, small HH (Vanga, Tally Due, MD)  . EYE SURGERY  2012   laser surgery for glaucoma  . PORTACATH PLACEMENT Left 12/09/2018   Procedure: INSERTION PORT-A-CATH;  Surgeon: [name], MD;  Location: [facility] ORS;  Service: General;   Laterality: Left;  . PTCA  1994, 1995  . US ECHOCARDIOGRAPHY  10/2013   inferior wall hypokinesis, mild LVH, EF 50-55%, mild MR and LA dilation    Current Outpatient Medications  Medication Sig Dispense Refill  . acetaminophen (TYLENOL) 650 MG CR tablet Take 1,300 mg by mouth 2 (two) times daily.    Marland Kitchen amLODipine (NORVASC) 5 MG tablet Take 1 tablet (5 mg total) by mouth daily. 90 tablet 3  . atorvastatin (LIPITOR) 80 MG tablet Take 1 tablet (80 mg total) by mouth every Monday, Wednesday, and Friday. 39 tablet 3  . carvedilol (COREG) 6.25 MG tablet Take 1 tablet (6.25 mg total) by mouth 2 (two) times daily with a meal. 180 tablet 3  . Continuous Blood Gluc Receiver (FREESTYLE LIBRE 14 DAY READER) DEVI Use as directed to check sugars QID AC HS 2 each 3  . Continuous Blood Gluc Sensor (FREESTYLE LIBRE 14 DAY SENSOR) MISC USE AS DIRECTED TO  CHECK  SUGARS  FOUR  TIMES  DAILY  BEFORE  MEALS  AND  AT  BEDTIME 2 each 3  . ezetimibe (ZETIA) 10 MG tablet Take 1 tablet (10 mg total) by mouth daily. 90 tablet 3  . insulin detemir (LEVEMIR FLEXTOUCH) 100 UNIT/ML FlexPen Inject 16 Units into the skin daily. 15 mL 3  . levothyroxine (SYNTHROID) 88 MCG tablet Take 1 tablet (88 mcg total) by mouth daily. 90 tablet 3  . lisinopril (ZESTRIL) 5 MG tablet Take 1 tablet (5 mg total) by mouth at bedtime. 90 tablet 3  . nitroGLYCERIN (NITROSTAT) 0.4 MG SL tablet Place 1 tablet (0.4 mg total) under the tongue every 5 (five) minutes as needed for chest pain ((MAX of 3 doses)). 25 tablet 0  . NOVOLOG FLEXPEN 100 UNIT/ML FlexPen Inject 5-8 Units into the skin 3 (three) times daily with meals. 30 mL 3  . oxymetazoline (AFRIN) 0.05 % nasal spray Place 1 spray into left nostril daily as needed (nose bleeds).    . traZODone (DESYREL) 100 MG tablet TAKE 1 TABLET BY MOUTH AT BEDTIME AS NEEDED FOR SLEEP 90 tablet 0   No current facility-administered medications for this visit.   Facility-Administered Medications Ordered in  Other Visits  Medication Dose Route Frequency Provider Last Rate Last Admin  . sodium chloride flush (NS) 0.9 % injection 10 mL  10 mL Intravenous Once [name], Kathlene November, MD         Allergies:   Patient has no known allergies.   Social History:  The patient  reports that he quit smoking about 42 years ago. He has never used smokeless tobacco. He reports that he does not drink alcohol and does not use drugs.   Family History:   family history includes Coronary artery disease (age of onset: 1) in his son; Diabetes in his sister; Hyperlipidemia in his sister; Other in his brother; Pneumonia in his father; Stomach cancer (age of onset: 14) in his sister.    Review of Systems: Review of Systems  Constitutional: Negative.   HENT: Negative.   Respiratory: Negative.   Cardiovascular: Negative.   Gastrointestinal: Negative.   Musculoskeletal: Negative.   Neurological: Negative.  Psychiatric/Behavioral: Negative.   All other systems reviewed and are negative.    PHYSICAL EXAM: VS:  BP 130/80 (BP Location: Left Arm, Patient Position: Sitting, Cuff Size: Normal)   Pulse 63   Ht 5\' 11"  (1.803 m)   Wt 158 lb 6 oz (71.8 kg)   SpO2 99%   BMI 22.09 kg/m  , BMI Body mass index is 22.09 kg/m. GEN: Well nourished, well developed, in no acute distress HEENT: normal Neck: no JVD, carotid bruits, or masses Cardiac: RRR; no murmurs, rubs, or gallops,no edema  Respiratory:  clear to auscultation bilaterally, normal work of breathing GI: soft, nontender, nondistended, + BS MS: no deformity or atrophy Skin: warm and dry, no rash Neuro:  Strength and sensation are intact Psych: euthymic mood, full affect  Recent Labs: 04/12/2020: TSH 1.80 05/08/2020: ALT 21; BUN 40; Creatinine, Ser 1.39; Hemoglobin 11.2; Platelets 124; Potassium 5.1; Sodium 141    Lipid Panel Lab Results  Component Value Date   CHOL 179 04/12/2020   HDL 40.40 04/12/2020   LDLCALC 99 04/12/2020   TRIG 198.0 (H)  04/12/2020      Wt Readings from Last 3 Encounters:  06/06/20 158 lb 6 oz (71.8 kg)  05/08/20 160 lb 8 oz (72.8 kg)  04/19/20 156 lb 5 oz (70.9 kg)       ASSESSMENT AND PLAN:  Problem List Items Addressed This Visit      Cardiology Problems   CAD (coronary artery disease) - Primary   Relevant Medications   ezetimibe (ZETIA) 10 MG tablet   Other Relevant Orders   EKG 12-Lead    Other Visit Diagnoses    History of bradycardia       Essential hypertension       Relevant Medications   ezetimibe (ZETIA) 10 MG tablet   Other Relevant Orders   EKG 12-Lead   Bilateral carotid artery stenosis       Relevant Medications   ezetimibe (ZETIA) 10 MG tablet   Mild mitral regurgitation       Relevant Medications   ezetimibe (ZETIA) 10 MG tablet     CAD with stable angina Currently with no symptoms of angina. No further workup at this time.  Cholesterol above goal, will add Zetia 10 mg daily to achieve goal LDL less than 70  PAD Carotid stenosis discussed with him Zetia as above repeat ultrasound in 2 years  Essential hypertension Blood pressure is well controlled on today's visit. No changes made to the medications.  CLL Reports after treatment, symptoms have stabilized, CBC reviewed with him, stable Followed by oncology    Total encounter time more than 35 minutes  Greater than 50% was spent in counseling and coordination of care with the patient    Signed, Esmond Plants, M.D., Ph.D. [place], [place]

## 2020-06-05 NOTE — Telephone Encounter (Signed)
Received secure msg from Almyra Free, palliative care RN, reporting pt would prefer referral to a new endocrinologist. His was not happy with his initial visit at [facility]. Advised a msg would be sent to the referral department to see what could be done.

## 2020-06-05 NOTE — Telephone Encounter (Signed)
1151 am.  Phone call made to patient to complete a telephonic visit.  Significant other Mavis, answered and asked that I call back in 15-20 minutes as patient is currently driving.  1212 pm.  Return call made to patient and I am able to complete a telephonic visit.  Patient states he is currently at [facility] helping a friend.    Patient feels overall he is doing well.  He was seen last month for an initial diabetes consult but stated he did not wish to return to that clinic.  Patient noted the wait time and actual visit with the provider was not beneficial.  He is open to trying another provider if this is an option.  Advised that I would reach out to PCP office to see if another referral could be made to a different endocrinologist.  Blood sugars continue to fluctuate per patient.  He is checking his blood sugars via Libre a minimum of 5x daily.  He notes his lantus was increased to 16 units at bedtime and he has been following this regimen.  Blood sugar this am was 129 fasting.   Patient states once he is up for the day his blood sugars are 160 to 170.  He is eating 2 meals a day which consist of breakfast and dinner.  He notes that he snacks at lunch time.   Patient reports he is now 160 lbs.  No edema present to bilateral lower extremities.  Patient states he now has hearing aides that has certainly improved his quality of life.  He notes being able to hear things he did not previously hear.  Patient denies any issues with pain and denies any new concerns.    PLAN:  Reach out to PCP office regarding referral for endocrinology and follow up with patient regarding this decision.  Advised patient that Palliative Care would continue to outreach him on a monthly basis for follow up.

## 2020-06-06 ENCOUNTER — Ambulatory Visit (INDEPENDENT_AMBULATORY_CARE_PROVIDER_SITE_OTHER): Payer: Medicare Other | Admitting: Cardiovascular Disease

## 2020-06-06 ENCOUNTER — Encounter: Payer: Self-pay | Admitting: Cardiovascular Disease

## 2020-06-06 ENCOUNTER — Other Ambulatory Visit: Payer: Self-pay | Admitting: Family Medicine

## 2020-06-06 ENCOUNTER — Other Ambulatory Visit: Payer: Self-pay

## 2020-06-06 VITALS — BP 130/80 | HR 63 | Ht 71.0 in | Wt 158.4 lb

## 2020-06-06 DIAGNOSIS — I6523 Occlusion and stenosis of bilateral carotid arteries: Secondary | ICD-10-CM

## 2020-06-06 DIAGNOSIS — I25118 Atherosclerotic heart disease of native coronary artery with other forms of angina pectoris: Secondary | ICD-10-CM

## 2020-06-06 DIAGNOSIS — I10 Essential (primary) hypertension: Secondary | ICD-10-CM

## 2020-06-06 DIAGNOSIS — Z87898 Personal history of other specified conditions: Secondary | ICD-10-CM | POA: Diagnosis not present

## 2020-06-06 DIAGNOSIS — E02 Subclinical iodine-deficiency hypothyroidism: Secondary | ICD-10-CM

## 2020-06-06 DIAGNOSIS — I340 Nonrheumatic mitral (valve) insufficiency: Secondary | ICD-10-CM

## 2020-06-06 MED ORDER — EZETIMIBE 10 MG PO TABS: 10.0000 mg | ORAL_TABLET | Freq: Every day | ORAL | 3 refills | Status: DC

## 2020-06-06 NOTE — Patient Instructions (Signed)
Medication Instructions:  Please start zetia 10 mg once a day for cholesterol  If you need a refill on your cardiac medications before your next appointment, please call your pharmacy.    Lab work: No new labs needed   If you have labs (blood work) drawn today and your tests are completely normal, you will receive your results only by: Marland Kitchen MyChart Message (if you have MyChart) OR . A paper copy in the mail If you have any lab test that is abnormal or we need to change your treatment, we will call you to review the results.   Testing/Procedures: No new testing needed   Follow-Up: At [facility], you and your health needs are our priority.  As part of our continuing mission to provide you with exceptional heart care, we have created designated Provider Care Teams.  These Care Teams include your primary Cardiologist (physician) and Advanced Practice Providers (APPs -  Physician Assistants and Nurse Practitioners) who all work together to provide you with the care you need, when you need it.  . You will need a follow up appointment in 12 months  . Providers on your designated Care Team:   . Murray Hodgkins, NP . Christell Faith, PA-C . Marrianne Mood, PA-C  Any Other Special Instructions Will Be Listed Below (If Applicable).  COVID-19 Vaccine Information can be found at: ShippingScam.co.uk For questions related to vaccine distribution or appointments, please email vaccine@[email or web address] .com or call [phone].

## 2020-06-06 NOTE — Telephone Encounter (Signed)
Spoke with patient about options. Patient would like to stay in [place]. Advised patient onlyone other place available here, Dr Ronnald Collum. Patient would like to try this office. Dr Darnell Level, please place new referral for this office. Patient is aware once referral is placed we will send it over to them and their office will call patient to schedule. Thank you

## 2020-06-06 NOTE — Telephone Encounter (Signed)
Before referring to Dr Ronnald Collum, [place] like to see him back in office - let's defer this referral until next appt 08/2020.

## 2020-06-06 NOTE — Telephone Encounter (Signed)
Noted. Left detailed message for patient letting him know Dr Synthia Innocent comment

## 2020-06-07 NOTE — Telephone Encounter (Signed)
Pharmacy requests refill on: Trazodone 100 mg   LAST REFILL: 03/02/2020 (Q-90, R-0) LAST OV: 04/19/2020 NEXT OV: 08/18/2020 PHARMACY: [place] [place] requests refill on: Euthyrox 88 mcg   LAST REFILL: 04/19/2020 (Q-90, R-3) LAST OV: 04/19/2020 NEXT OV: 08/18/2020 PHARMACY: [id number] [place], [place]  Earliest Fill Date: 04/19/2021

## 2020-07-20 ENCOUNTER — Telehealth: Payer: Self-pay

## 2020-07-20 NOTE — Telephone Encounter (Signed)
  2:25PM: Palliative care SW outreached patient for monthly telephonic visit and to schedule in person visit.  Call unsuccessful. SW left HIPPA compliant VM.  Awaiting return call. Will outreach again at later date and continue to offer palliative care support.

## 2020-07-22 IMAGING — DX DG CHEST 1V PORT
1 series · 1 of 1 positions shown · non-contrast
Comparison: None.

CLINICAL DATA: POST OP INSERTION PORT-A-CATH.

EXAM:
PORTABLE CHEST 1 VIEW

[chest ap]
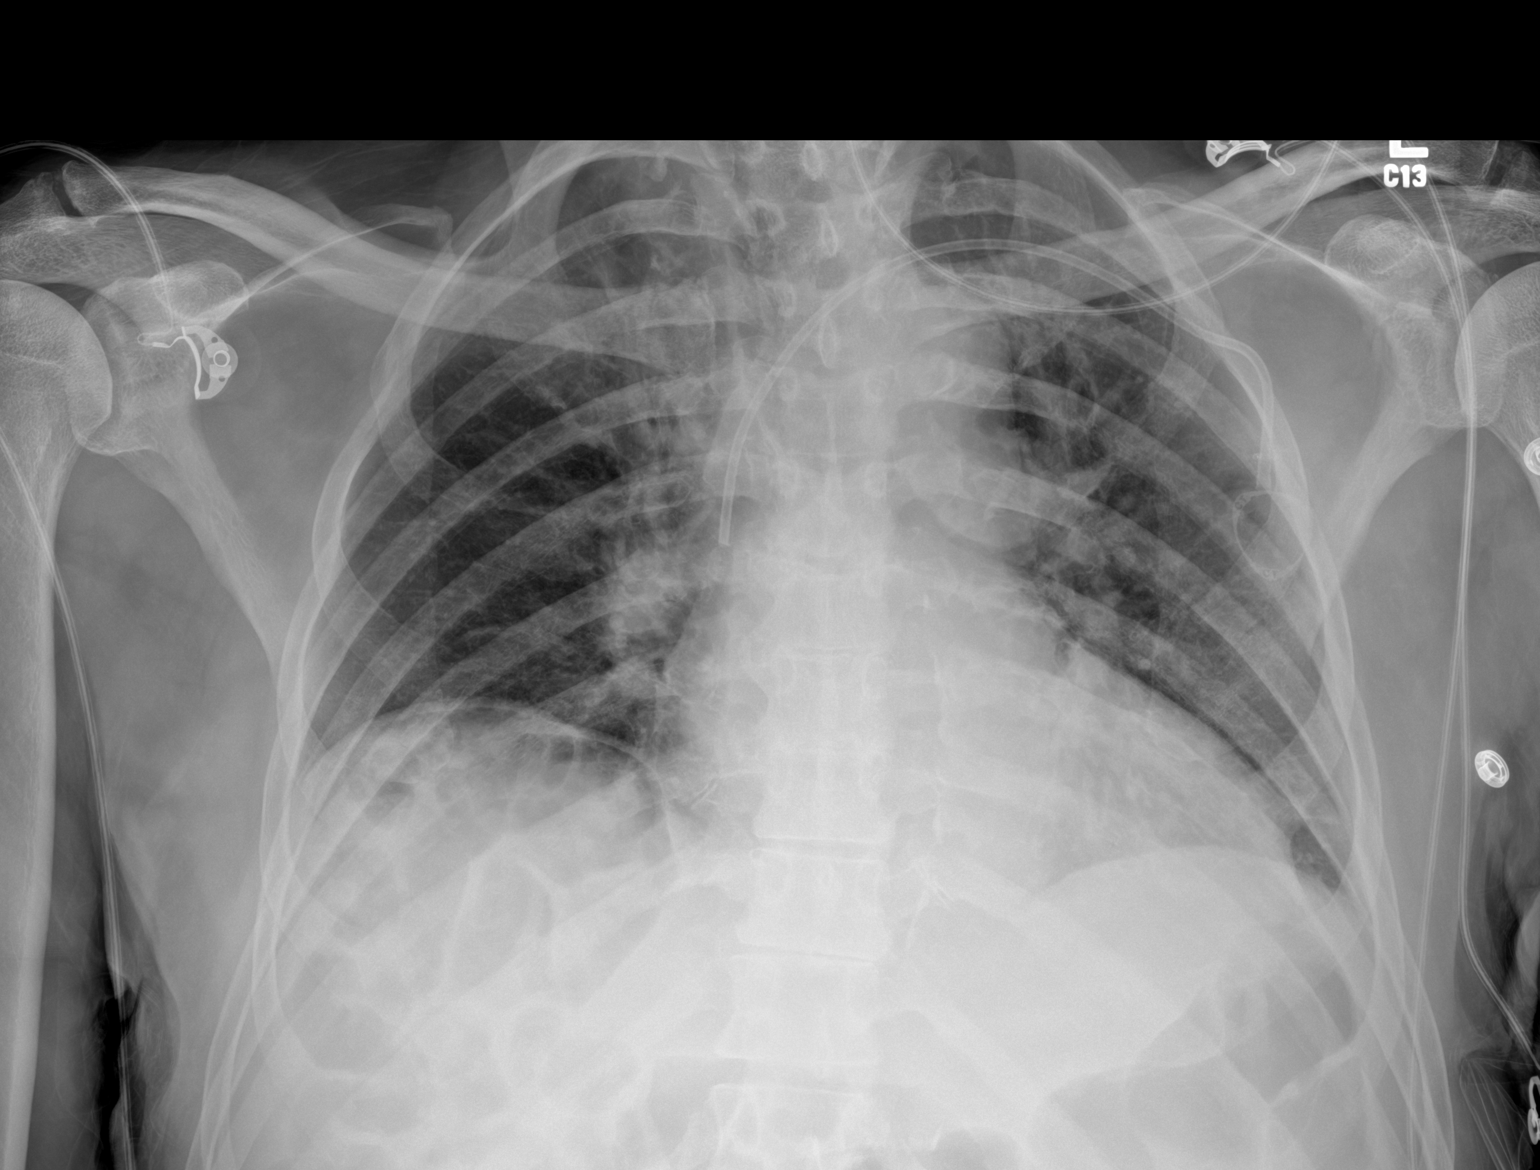

[1 of 1 positions shown; findings below may reference images not displayed]

FINDINGS: There is a left-sided Port-A-Cath with the tip projecting over the
SVC. There are low lung volumes. There is no focal consolidation.
There is no pleural effusion or pneumothorax. The heart and
mediastinal contours are unremarkable.

There is no acute osseous abnormality.
IMPRESSION: Left-sided Port-A-Cath with the tip projecting over the SVC.

## 2020-07-25 ENCOUNTER — Ambulatory Visit: Payer: Medicare Other | Admitting: Dermatology

## 2020-07-28 ENCOUNTER — Telehealth: Payer: Self-pay | Admitting: Oncology

## 2020-07-28 NOTE — Telephone Encounter (Signed)
Patient called on 5/27 requesting to have his labs drawn next week instead of waiting until 6/7 because he is feeling really weak.  Appointment moved, team notified.

## 2020-08-01 ENCOUNTER — Inpatient Hospital Stay: Payer: Medicare Other | Attending: Oncology

## 2020-08-01 DIAGNOSIS — C9110 Chronic lymphocytic leukemia of B-cell type not having achieved remission: Secondary | ICD-10-CM | POA: Insufficient documentation

## 2020-08-01 LAB — CBC WITH DIFFERENTIAL/PLATELET
Abs Immature Granulocytes: 0 10*3/uL (ref 0.00–0.07)
Basophils Absolute: 0 10*3/uL (ref 0.0–0.1)
Basophils Relative: 1 %
Eosinophils Absolute: 0.1 10*3/uL (ref 0.0–0.5)
Eosinophils Relative: 3 %
HCT: 34.5 % — ABNORMAL LOW (ref 39.0–52.0)
Hemoglobin: 11.2 g/dL — ABNORMAL LOW (ref 13.0–17.0)
Immature Granulocytes: 0 %
Lymphocytes Relative: 18 %
Lymphs Abs: 0.7 10*3/uL (ref 0.7–4.0)
MCH: 30.7 pg (ref 26.0–34.0)
MCHC: 32.5 g/dL (ref 30.0–36.0)
MCV: 94.5 fL (ref 80.0–100.0)
Monocytes Absolute: 0.3 10*3/uL (ref 0.1–1.0)
Monocytes Relative: 8 %
Neutro Abs: 2.7 10*3/uL (ref 1.7–7.7)
Neutrophils Relative %: 70 %
Platelets: 124 10*3/uL — ABNORMAL LOW (ref 150–400)
RBC: 3.65 MIL/uL — ABNORMAL LOW (ref 4.22–5.81)
RDW: 12 % (ref 11.5–15.5)
WBC: 3.8 10*3/uL — ABNORMAL LOW (ref 4.0–10.5)
nRBC: 0 % (ref 0.0–0.2)

## 2020-08-01 LAB — COMPREHENSIVE METABOLIC PANEL
ALT: 24 U/L (ref 0–44)
AST: 23 U/L (ref 15–41)
Albumin: 4.3 g/dL (ref 3.5–5.0)
Alkaline Phosphatase: 77 U/L (ref 38–126)
Anion gap: 10 (ref 5–15)
BUN: 47 mg/dL — ABNORMAL HIGH (ref 8–23)
CO2: 26 mmol/L (ref 22–32)
Calcium: 9.8 mg/dL (ref 8.9–10.3)
Chloride: 105 mmol/L (ref 98–111)
Creatinine, Ser: 1.75 mg/dL — ABNORMAL HIGH (ref 0.61–1.24)
GFR, Estimated: 39 mL/min — ABNORMAL LOW (ref >60)
Glucose, Bld: 195 mg/dL — ABNORMAL HIGH (ref 70–99)
Potassium: 5.2 mmol/L — ABNORMAL HIGH (ref 3.5–5.1)
Sodium: 141 mmol/L (ref 135–145)
Total Bilirubin: 0.8 mg/dL (ref 0.3–1.2)
Total Protein: 6.8 g/dL (ref 6.5–8.1)

## 2020-08-01 LAB — SAMPLE TO BLOOD BANK

## 2020-08-04 NOTE — Progress Notes (Signed)
[place]  [phone] [phone]  ID: Scott [name] OB: 1940/07/28  MR#: [id number]  BWG#:[id number]  Patient Care Team: Scott Bush, MD as PCP - General (Family Medicine) Scott Spark, MD (Inactive) as PCP - Cardiology (Cardiology) Scott Huger, MD as Consulting Physician (Oncology) Scott Jacks, MD as Consulting Physician (Ophthalmology) Scott Spark, MD (Inactive) as Consulting Physician (Cardiology) Scott Lips, MD as Consulting Physician (Nephrology)   CHIEF COMPLAINT: 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  INTERVAL HISTORY: Patient returns to clinic today for repeat laboratory work and routine 60-monthevaluation.  He continues to feel well and remains asymptomatic.  He denies any weakness or fatigue and remains active.  He denies any recent fevers or illnesses.  He has no neurologic complaints.  He has a good appetite and denies weight loss. He denies any night sweats. He has noted no lymphadenopathy.  He has no chest pain, shortness of breath, cough, or hemoptysis.  He denies any nausea, vomiting, constipation, or diarrhea.  He denies any melena or hematochezia.  He has no urinary complaints.  Patient feels at his baseline offers no specific complaints today.  REVIEW OF SYSTEMS:   Review of Systems  Constitutional: Negative.  Negative for fever, malaise/fatigue and weight loss.  HENT: Negative.  Negative for nosebleeds.   Respiratory: Negative.  Negative for cough and shortness of breath.   Cardiovascular: Negative.  Negative for chest pain and leg swelling.  Gastrointestinal: Negative.  Negative for abdominal pain, blood in stool and melena.  Genitourinary: Negative.  Negative for dysuria and hematuria.  Musculoskeletal: Negative.  Negative for back pain and neck pain.  Skin: Negative.  Negative for itching and rash.  Neurological: Negative.  Negative for dizziness, sensory change, focal weakness,  weakness and headaches.  Endo/Heme/Allergies: Negative.  Does not bruise/bleed easily.  Psychiatric/Behavioral: Negative.  The patient is not nervous/anxious.     As per HPI. Otherwise, a complete review of systems is negative.  PAST MEDICAL HISTORY: Past Medical History:  Diagnosis Date  . Acquired hand deformity 1962   hand saw accident at work  . Anemia 10/11/2011  . Arthritis   . Bradycardia 10/10/2011  . CAD (coronary artery disease) 10/10/2011   MI s/p PTCA (Dx-OM2 proximal concentric stenosis)  . Carotid artery disease ([place]   . Cellulitis of buttock, left 11/17/2018  . Chronic edema   . CKD (chronic kidney disease) stage 3, GFR 30-59 ml/min (HCC)    Scott [name]  . CLL (chronic lymphocytic leukemia) ([facility]   . Colon polyps   . Cough 04/28/2019  . Generalized headaches    frequent  . GI bleed 07/08/2017  . Glaucoma    s/p laser surgery  . HLD (hyperlipidemia)   . Hyperkalemia 02/28/2018  . Hypertension   . Hypothyroidism 10/10/2011  . Iron deficiency anemia [name] to chronic blood loss   . Squamous cell carcinoma of skin 06/15/2019   Right posterior ear. SCCis, hypertrophic, crusted  . Thrombocytopenia ([facility] 10/11/2011  . Type 2 diabetes with nephropathy ([facility]    DM refresher course [facility] (04/2013)    PAST SURGICAL HISTORY: Past Surgical History:  Procedure Laterality Date  . BACK SURGERY     cervical neck  . CATARACT EXTRACTION, BILATERAL Bilateral 2017  . COLONOSCOPY  11/2008   1 polyp, diverticulosis, rec rpt 5 yrs (Dr. EOletta Lamas ESadie [name]  . COLONOSCOPY  06/2014   hyperplastic polyp, rpt 5 yrs (Scott [name])  . COLONOSCOPY WITH PROPOFOL N/A 11/17/2017  TA, HP, (Scott [name], Scott Due, MD)  . ESOPHAGOGASTRODUODENOSCOPY (EGD) WITH PROPOFOL N/A 11/17/2017   healing erosive gastritis, intestinal metaplasia, neg H pylori (Scott [name], Scott Due, MD)  . ESOPHAGOGASTRODUODENOSCOPY (EGD) WITH PROPOFOL N/A 06/28/2019   erosive gastropathy with stigmata of recent bleed, granular tissue in esophagus  biopsied- reflux esophagitis, small HH (Scott [name], Scott Due, MD)  . EYE SURGERY  2012   laser surgery for glaucoma  . PORTACATH PLACEMENT Left 12/09/2018   Procedure: INSERTION PORT-A-CATH;  Surgeon: Scott Ree, MD;  Location: [facility] ORS;  Service: General;  Laterality: Left;  . PTCA  1994, 1995  . US ECHOCARDIOGRAPHY  10/2013   inferior wall hypokinesis, mild LVH, EF 50-55%, mild MR and LA dilation    FAMILY HISTORY: Family History  Problem Relation Age of Onset  . Diabetes Sister   . Stomach cancer Sister 75  . Pneumonia Father        caused death  . Other Brother        no communication with brother so unsure of any health conditions  . Coronary artery disease Son 41       5v CABG and stents  . Hyperlipidemia Sister   . Stroke Neg Hx   . Heart attack Neg Hx     ADVANCED DIRECTIVES (Y/N):  N  HEALTH MAINTENANCE: Social History   Tobacco Use  . Smoking status: Former Smoker    Quit date: 03/04/1978    Years since quitting: 42.4  . Smokeless tobacco: Never Used  Vaping Use  . Vaping Use: Never used  Substance Use Topics  . Alcohol use: No  . Drug use: No     Colonoscopy:  PAP:  Bone density:  Lipid panel:  No Known Allergies  Current Outpatient Medications  Medication Sig Dispense Refill  . acetaminophen (TYLENOL) 650 MG CR tablet Take 1,300 mg by mouth 2 (two) times daily.    Marland Kitchen amLODipine (NORVASC) 5 MG tablet Take 1 tablet (5 mg total) by mouth daily. 90 tablet 3  . atorvastatin (LIPITOR) 80 MG tablet Take 1 tablet (80 mg total) by mouth every Monday, Wednesday, and Friday. 39 tablet 3  . carvedilol (COREG) 6.25 MG tablet Take 1 tablet (6.25 mg total) by mouth 2 (two) times daily with a meal. 180 tablet 3  . Continuous Blood Gluc Receiver (FREESTYLE LIBRE 14 DAY READER) DEVI Use as directed to check sugars QID AC HS 2 each 3  . Continuous Blood Gluc Sensor (FREESTYLE LIBRE 14 DAY SENSOR) MISC USE AS DIRECTED TO  CHECK  SUGARS  FOUR  TIMES  DAILY  BEFORE  MEALS   AND  AT  BEDTIME 2 each 3  . ezetimibe (ZETIA) 10 MG tablet Take 1 tablet (10 mg total) by mouth daily. 90 tablet 3  . insulin detemir (LEVEMIR FLEXTOUCH) 100 UNIT/ML FlexPen Inject 16 Units into the skin daily. 15 mL 3  . levothyroxine (SYNTHROID) 88 MCG tablet Take 1 tablet (88 mcg total) by mouth daily. 90 tablet 3  . lisinopril (ZESTRIL) 5 MG tablet Take 1 tablet (5 mg total) by mouth at bedtime. 90 tablet 3  . nitroGLYCERIN (NITROSTAT) 0.4 MG SL tablet Place 1 tablet (0.4 mg total) under the tongue every 5 (five) minutes as needed for chest pain ((MAX of 3 doses)). 25 tablet 0  . NOVOLOG FLEXPEN 100 UNIT/ML FlexPen Inject 5-8 Units into the skin 3 (three) times daily with meals. 30 mL 3  . oxymetazoline (AFRIN) 0.05 % nasal spray Place 1 spray  into left nostril daily as needed (nose bleeds).    . traZODone (DESYREL) 100 MG tablet TAKE 1 TABLET BY MOUTH AT BEDTIME AS NEEDED FOR SLEEP 90 tablet 0   No current facility-administered medications for this visit.   Facility-Administered Medications Ordered in Other Visits  Medication Dose Route Frequency Provider Last Rate Last Admin  . sodium chloride flush (NS) 0.9 % injection 10 mL  10 mL Intravenous Once Scott Huger, MD        OBJECTIVE: Vitals:   08/08/20 1046 08/08/20 1051  BP:  122/78  Resp: 20   Temp: (!) 96 F (35.6 C)   SpO2:  96%     Body mass index is 21.06 kg/m.    ECOG FS:0 - Asymptomatic  General: Well-developed, well-nourished, no acute distress. Eyes: Pink conjunctiva, anicteric sclera. HEENT: Normocephalic, moist mucous membranes. Lungs: No audible wheezing or coughing. Heart: Regular rate and rhythm. Abdomen: Soft, nontender, no obvious distention. Musculoskeletal: No edema, cyanosis, or clubbing. Neuro: Alert, answering all questions appropriately. Cranial nerves grossly intact. Skin: No rashes or petechiae noted. Psych: Normal affect.   LAB RESULTS:  Lab Results  Component Value Date   NA 141  08/01/2020   K 5.2 (H) 08/01/2020   CL 105 08/01/2020   CO2 26 08/01/2020   GLUCOSE 195 (H) 08/01/2020   BUN 47 (H) 08/01/2020   CREATININE 1.75 (H) 08/01/2020   CALCIUM 9.8 08/01/2020   PROT 6.8 08/01/2020   ALBUMIN 4.3 08/01/2020   AST 23 08/01/2020   ALT 24 08/01/2020   ALKPHOS 77 08/01/2020   BILITOT 0.8 08/01/2020   GFRNONAA 39 (L) 08/01/2020   GFRAA 42 (L) 10/28/2019    Lab Results  Component Value Date   WBC 3.8 (L) 08/01/2020   NEUTROABS 2.7 08/01/2020   HGB 11.2 (L) 08/01/2020   HCT 34.5 (L) 08/01/2020   MCV 94.5 08/01/2020   PLT 124 (L) 08/01/2020     STUDIES: No results found.  ONCOLOGY HISTORY: Bone marrow biopsy on Jul 28, 2018 reviewed independently with 73% involvement of CLL.  Previous bone marrow biopsy on October 23, 2017 revealed only 31% involvement.  Previously, peripheral blood FISH testing 50% incidence of mutation in the ATM gene which is commonly associated with deletion 11q. 11q- is associated with an unfavorable prognosis and high risk of not responding to initial treatment. 13q- is a more common mutation and is actually associated with a more favorable prognosis. Patient had clear progression of disease in his bone marrow along with a worsening transfusion requirement.  He received 5 weekly cycles of Rituxan with mild improvement of his platelet count.  Patient then received 4 cycles of Rituxan plus Treanda completing treatment on January 12, 2019.   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  PLAN:    1. CLL: Chronic and unchanged.  Patient remains in at least a very good partial remission.  No intervention is needed.  Patient does not require additional treatment at this time.  If patient required retreatment, would consider Rituxan plus Treanda once again.  Return to clinic in 3 months with repeat laboratory work and further evaluation.  At which time, patient can likely be transitioned to laboratory work every 3 months and  evaluation every 6 months. 2.  Thrombocytopenia: Chronic and unchanged.  Patient's platelet count is 124.  Previously, he received high-dose prednisone, IVIG, and Rituxan with no appreciable durability to improve his platelet count.  Platelets improved upon treatment of his CLL.  3.  Anemia: Chronic and unchanged.  Patient's hemoglobin is 11.2 today.  His last blood transfusion was given on December 03, 2018.  Patient had colonoscopy on November 17, 2017 that removed 2 nonmalignant polyps.  EGD on the same day revealed nonbleeding erosive gastropathy with multiple nonbleeding duodenal ulcers.  Repeat EGD did not reveal significant pathology.  Continue follow-up with GI as scheduled. 4.  Reaction to Rituxan: Rate-based.  Patient will require premedications for any further infusions with Rituxan.  These have been entered into his treatment plan. 5.  Chronic renal insufficiency: Patient's most recent creatinine was slightly worse at 1.75.  Continue monitoring and treatment per primary care. 6.  Hyperkalemia: Mild, monitor.  Patient expressed understanding and was in agreement with this plan. He also understands that He can call clinic at any time with any questions, concerns, or complaints.    Scott Huger, MD   08/08/2020 12:27 PM

## 2020-08-08 ENCOUNTER — Telehealth: Payer: Self-pay

## 2020-08-08 ENCOUNTER — Inpatient Hospital Stay: Payer: Medicare Other | Attending: Oncology | Admitting: Oncology

## 2020-08-08 ENCOUNTER — Other Ambulatory Visit: Payer: Self-pay

## 2020-08-08 ENCOUNTER — Encounter: Payer: Self-pay | Admitting: Oncology

## 2020-08-08 ENCOUNTER — Inpatient Hospital Stay: Payer: Medicare Other

## 2020-08-08 VITALS — BP 122/78 | Temp 96.0°F | Resp 20 | Wt 151.0 lb

## 2020-08-08 DIAGNOSIS — D649 Anemia, unspecified: Secondary | ICD-10-CM | POA: Insufficient documentation

## 2020-08-08 DIAGNOSIS — C9110 Chronic lymphocytic leukemia of B-cell type not having achieved remission: Secondary | ICD-10-CM | POA: Diagnosis not present

## 2020-08-08 DIAGNOSIS — N189 Chronic kidney disease, unspecified: Secondary | ICD-10-CM | POA: Insufficient documentation

## 2020-08-08 DIAGNOSIS — E875 Hyperkalemia: Secondary | ICD-10-CM | POA: Diagnosis not present

## 2020-08-08 DIAGNOSIS — D696 Thrombocytopenia, unspecified: Secondary | ICD-10-CM | POA: Insufficient documentation

## 2020-08-08 NOTE — Telephone Encounter (Signed)
12 pm.  Phone call made to patient to schedule a home visit.  Call unsuccessful.  HIPPA compliant message has been left requesting a call back.  Awaiting call back from patient.  Will continue to outreach patient to offer palliative care support.

## 2020-08-08 NOTE — Progress Notes (Signed)
Patient denies any concerns today.  

## 2020-08-18 ENCOUNTER — Encounter: Payer: Self-pay | Admitting: Family Medicine

## 2020-08-18 ENCOUNTER — Ambulatory Visit (INDEPENDENT_AMBULATORY_CARE_PROVIDER_SITE_OTHER): Payer: Medicare Other | Admitting: Family Medicine

## 2020-08-18 ENCOUNTER — Other Ambulatory Visit: Payer: Self-pay

## 2020-08-18 VITALS — BP 118/76 | HR 60 | Temp 97.5°F | Ht 71.0 in | Wt 158.2 lb

## 2020-08-18 DIAGNOSIS — E1122 Type 2 diabetes mellitus with diabetic chronic kidney disease: Secondary | ICD-10-CM

## 2020-08-18 DIAGNOSIS — I6523 Occlusion and stenosis of bilateral carotid arteries: Secondary | ICD-10-CM

## 2020-08-18 DIAGNOSIS — C9110 Chronic lymphocytic leukemia of B-cell type not having achieved remission: Secondary | ICD-10-CM

## 2020-08-18 DIAGNOSIS — E1169 Type 2 diabetes mellitus with other specified complication: Secondary | ICD-10-CM

## 2020-08-18 DIAGNOSIS — N183 Chronic kidney disease, stage 3 unspecified: Secondary | ICD-10-CM | POA: Diagnosis not present

## 2020-08-18 DIAGNOSIS — H9193 Unspecified hearing loss, bilateral: Secondary | ICD-10-CM

## 2020-08-18 DIAGNOSIS — H9190 Unspecified hearing loss, unspecified ear: Secondary | ICD-10-CM | POA: Insufficient documentation

## 2020-08-18 DIAGNOSIS — E785 Hyperlipidemia, unspecified: Secondary | ICD-10-CM

## 2020-08-18 DIAGNOSIS — Z794 Long term (current) use of insulin: Secondary | ICD-10-CM | POA: Diagnosis not present

## 2020-08-18 LAB — POCT GLYCOSYLATED HEMOGLOBIN (HGB A1C): Hemoglobin A1C: 7.9 % — AB (ref 4.0–5.6)

## 2020-08-18 NOTE — Patient Instructions (Addendum)
Novolog injections work best at room temperature. Take novolog 5-10 minutes before the meal.  Continue current regimen.  Try to get meals at same time every day.  Return in 4 months for follow up visit.   The 15-15 rule for low sugars: If sugar reading below 70, have 15 grams of carbohydrate to raise your blood sugar and check it after 15 minutes. If it's still below 70 mg/dL, have another serving. 15 grams of carbs may be: -Glucose tablets (see instructions) -Gel tube (see instructions) -4 ounces (1/2 cup) of juice or regular soda (not diet) -1 tablespoon of sugar, honey, or corn syrup -Hard candies, jellybeans or gumdrops--see food label for how many to consume  Repeat these steps until your blood sugar is at least 70 mg/dL. Once your blood sugar is back to normal, eat a meal or snack to make sure it doesn't lower again.

## 2020-08-18 NOTE — Assessment & Plan Note (Signed)
Pt satisfied with new hearing aides.

## 2020-08-18 NOTE — Progress Notes (Signed)
Patient ID: Scott [name], male    DOB: 01/28/1941, 80 y.o.   MRN: [id number]  This visit was conducted in person.  BP 118/76   Pulse 60   Temp (!) 97.5 F (36.4 C) (Temporal)   Ht 5\' 11"  (1.803 m)   Wt 158 lb 4 oz (71.8 kg)   SpO2 98%   BMI 22.07 kg/m    CC: 4 mo f/u visit  Subjective:   HPI: Scott [name] is a 80 y.o. male presenting on 08/18/2020 for Follow-up (Here for 4 mo f/u.)   New hearing aides. Lives with GF.   CLL followed by onc (Dr [name]). Stable period. Thrombocytopenia possibly due to ITP, no improvement with prednisone, IVIG, Rituxan.   HLD - zetia 10mg  recently added to atorvastatin 80mg  MWF.   DM - established with endo Malissa Hippo NP - levemir increased to 16u daily, rec continue novolog 5-8u TID AC, continue Libre CGM use. Overdue for f/u - has decided not to return as he was not happy with length of wait and short visit. One low sugar this week to 60s. He's only taking lantus 13 units, only taking 3-4 units of novolog at a time. Brings CGM readings which were reviewed. Sugar 100 today and dropping, he's not had breakfast yet.  Lab Results  Component Value Date   HGBA1C 7.9 (A) 08/18/2020        Relevant past medical, surgical, family and social history reviewed and updated as indicated. Interim medical history since our last visit reviewed. Allergies and medications reviewed and updated. Outpatient Medications Prior to Visit  Medication Sig Dispense Refill   acetaminophen (TYLENOL) 650 MG CR tablet Take 1,300 mg by mouth 2 (two) times daily.     amLODipine (NORVASC) 5 MG tablet Take 1 tablet (5 mg total) by mouth daily. 90 tablet 3   atorvastatin (LIPITOR) 80 MG tablet Take 1 tablet (80 mg total) by mouth every Monday, Wednesday, and Friday. 39 tablet 3   carvedilol (COREG) 6.25 MG tablet Take 1 tablet (6.25 mg total) by mouth 2 (two) times daily with a meal. 180 tablet 3   Continuous Blood Gluc Receiver (FREESTYLE LIBRE 14 DAY READER) DEVI  Use as directed to check sugars QID AC HS 2 each 3   Continuous Blood Gluc Sensor (FREESTYLE LIBRE 14 DAY SENSOR) MISC USE AS DIRECTED TO  CHECK  SUGARS  FOUR  TIMES  DAILY  BEFORE  MEALS  AND  AT  BEDTIME 2 each 3   ezetimibe (ZETIA) 10 MG tablet Take 1 tablet (10 mg total) by mouth daily. 90 tablet 3   levothyroxine (SYNTHROID) 88 MCG tablet Take 1 tablet (88 mcg total) by mouth daily. 90 tablet 3   lisinopril (ZESTRIL) 5 MG tablet Take 1 tablet (5 mg total) by mouth at bedtime. 90 tablet 3   nitroGLYCERIN (NITROSTAT) 0.4 MG SL tablet Place 1 tablet (0.4 mg total) under the tongue every 5 (five) minutes as needed for chest pain ((MAX of 3 doses)). 25 tablet 0   oxymetazoline (AFRIN) 0.05 % nasal spray Place 1 spray into left nostril daily as needed (nose bleeds).     traZODone (DESYREL) 100 MG tablet TAKE 1 TABLET BY MOUTH AT BEDTIME AS NEEDED FOR SLEEP 90 tablet 0   insulin detemir (LEVEMIR FLEXTOUCH) 100 UNIT/ML FlexPen Inject 16 Units into the skin daily. 15 mL 3   NOVOLOG FLEXPEN 100 UNIT/ML FlexPen Inject 5-8 Units into the skin 3 (three) times daily  with meals. 30 mL 3   insulin detemir (LEVEMIR FLEXTOUCH) 100 UNIT/ML FlexPen Inject 13 Units into the skin daily. 15 mL    NOVOLOG FLEXPEN 100 UNIT/ML FlexPen Inject 3-4 Units into the skin 3 (three) times daily with meals. 15 mL 1   Facility-Administered Medications Prior to Visit  Medication Dose Route Frequency Provider Last Rate Last Admin   sodium chloride flush (NS) 0.9 % injection 10 mL  10 mL Intravenous Once [name], Kathlene November, MD         Per HPI unless specifically indicated in ROS section below Review of Systems Objective:  BP 118/76   Pulse 60   Temp (!) 97.5 F (36.4 C) (Temporal)   Ht 5\' 11"  (1.803 m)   Wt 158 lb 4 oz (71.8 kg)   SpO2 98%   BMI 22.07 kg/m   Wt Readings from Last 3 Encounters:  08/18/20 158 lb 4 oz (71.8 kg)  08/08/20 151 lb (68.5 kg)  06/06/20 158 lb 6 oz (71.8 kg)      Physical Exam Vitals and  nursing note reviewed.  Constitutional:      Appearance: Normal appearance. He is not ill-appearing.  Cardiovascular:     Rate and Rhythm: Normal rate and regular rhythm.     Pulses: Normal pulses.     Heart sounds: Normal heart sounds. No murmur heard. Pulmonary:     Effort: Pulmonary effort is normal. No respiratory distress.     Breath sounds: Normal breath sounds. No wheezing, rhonchi or rales.  Musculoskeletal:     Right lower leg: No edema.     Left lower leg: No edema.  Neurological:     Mental Status: He is alert.      Results for orders placed or performed in visit on 08/18/20  POCT glycosylated hemoglobin (Hb A1C)  Result Value Ref Range   Hemoglobin A1C 7.9 (A) 4.0 - 5.6 %   HbA1c POC (<> result, manual entry)     HbA1c, POC (prediabetic range)     HbA1c, POC (controlled diabetic range)     Assessment & Plan:  This visit occurred during the SARS-CoV-2 public health emergency.  Safety protocols were in place, including screening questions prior to the visit, additional usage of staff PPE, and extensive cleaning of exam room while observing appropriate contact time as indicated for disinfecting solutions.   Problem List Items Addressed This Visit     Hyperlipidemia associated with type 2 diabetes mellitus ([place])    Recent zetia commencement in addition to atorvastatin.  The ASCVD Risk score Mikey Bussing DC Jr., et al., 2013) failed to calculate for the following reasons:   The 2013 ASCVD risk score is only valid for ages 33 to 36        Relevant Medications   insulin detemir (LEVEMIR FLEXTOUCH) 100 UNIT/ML FlexPen   NOVOLOG FLEXPEN 100 UNIT/ML FlexPen   Controlled diabetes mellitus with stage 3 chronic kidney disease, with long-term current use of insulin (HCC) - Primary    Chronic brittle diabetes, adequate control. Decided not to return to [place] at this time.  Reviewed insulin dosing - he'd been taking novolog 15-30 min prior to meals, and sometimes injected with  pen straight out of fridge. Advised to try to inject medicine at room temperature and to start injecting novolog 5 min immediately before meal.  Discussed hypoglycemia plan and importance of regularly scheduled meals.  Pt had crackers and juice in office today, cbg prior to leaving was slightly up  at 105.       Relevant Medications   insulin detemir (LEVEMIR FLEXTOUCH) 100 UNIT/ML FlexPen   NOVOLOG FLEXPEN 100 UNIT/ML FlexPen   Other Relevant Orders   POCT glycosylated hemoglobin (Hb A1C) (Completed)   CLL (chronic lymphocytic leukemia) (HCC)    Appreciate onc care       Hearing loss    Pt satisfied with new hearing aides.          No orders of the defined types were placed in this encounter.  Orders Placed This Encounter  Procedures   POCT glycosylated hemoglobin (Hb A1C)    Patient Instructions  Novolog injections work best at room temperature. Take novolog 5-10 minutes before the meal.  Continue current regimen.  Try to get meals at same time every day.  Return in 4 months for follow up visit.   The 15-15 rule for low sugars: If sugar reading below 70, have 15 grams of carbohydrate to raise your blood sugar and check it after 15 minutes. If it's still below 70 mg/dL, have another serving. 15 grams of carbs may be: -Glucose tablets (see instructions) -Gel tube (see instructions) -4 ounces (1/2 cup) of juice or regular soda (not diet) -1 tablespoon of sugar, honey, or corn syrup -Hard candies, jellybeans or gumdrops--see food label for how many to consume  Repeat these steps until your blood sugar is at least 70 mg/dL. Once your blood sugar is back to normal, eat a meal or snack to make sure it doesn't lower again.     Follow up plan: Return in about 4 months (around 12/18/2020), or if symptoms worsen or fail to improve, for follow up visit.  [name], MD

## 2020-08-18 NOTE — Assessment & Plan Note (Signed)
Recent zetia commencement in addition to atorvastatin.  The ASCVD Risk score Scott Bussing DC Jr., et al., 2013) failed to calculate for the following reasons:   The 2013 ASCVD risk score is only valid for ages 57 to 17

## 2020-08-18 NOTE — Assessment & Plan Note (Addendum)
Chronic brittle diabetes, adequate control. Decided not to return to [place] at this time.  Reviewed insulin dosing - he'd been taking novolog 15-30 min prior to meals, and sometimes injected with pen straight out of fridge. Advised to try to inject medicine at room temperature and to start injecting novolog 5 min immediately before meal.  Discussed hypoglycemia plan and importance of regularly scheduled meals.  Pt had crackers and juice in office today, cbg prior to leaving was slightly up at 105.

## 2020-08-18 NOTE — Assessment & Plan Note (Signed)
Appreciate onc care.  

## 2020-08-23 ENCOUNTER — Encounter: Payer: Self-pay | Admitting: Family Medicine

## 2020-08-24 ENCOUNTER — Other Ambulatory Visit: Payer: Self-pay

## 2020-08-24 ENCOUNTER — Encounter: Payer: Self-pay | Admitting: Family Medicine

## 2020-08-24 ENCOUNTER — Ambulatory Visit (INDEPENDENT_AMBULATORY_CARE_PROVIDER_SITE_OTHER): Payer: Medicare Other | Admitting: Family Medicine

## 2020-08-24 VITALS — BP 175/93 | HR 63 | Temp 97.7°F | Ht 71.0 in | Wt 157.5 lb

## 2020-08-24 DIAGNOSIS — I6523 Occlusion and stenosis of bilateral carotid arteries: Secondary | ICD-10-CM

## 2020-08-24 DIAGNOSIS — R21 Rash and other nonspecific skin eruption: Secondary | ICD-10-CM | POA: Diagnosis not present

## 2020-08-24 MED ORDER — TRIAMCINOLONE ACETONIDE 0.5 % EX OINT: 1.0000 "application " | TOPICAL_OINTMENT | Freq: Two times a day (BID) | CUTANEOUS | 1 refills | Status: AC

## 2020-08-24 NOTE — Progress Notes (Signed)
Scott Rockholt T. Kasy [name], MD, Scott [name] at [name] [place], [address]  Phone: [phone]  FAX: [name] - 80 y.o. male  MRN [id number]  Date of Birth: 23-Aug-1940  Date: 08/24/2020  PCP: [name], MD  Referral: [name], MD  Chief Complaint  Patient presents with   Rash    Bilateral Legs    This visit occurred during the SARS-CoV-2 public health emergency.  Safety protocols were in place, including screening questions prior to the visit, additional usage of staff PPE, and extensive cleaning of exam room while observing appropriate contact time as indicated for disinfecting solutions.   Subjective:   Scott [name] is a 80 y.o. very pleasant male patient with Body mass index is 21.97 kg/m. who presents with the following:  First showed up on Tuesday. Monday was clearing a lot of brush.  There was so much brush, he is not sure what sort of plant life was there.  He is not clear if he got bug bites as well.  He has some distinct lesions on the legs and groins that are quite itchy and pruritic.  Lab Results  Component Value Date   HGBA1C 7.9 (A) 08/18/2020     Review of Systems is noted in the HPI, as appropriate  Objective:   BP (!) 175/93   Pulse 63   Temp 97.7 F (36.5 C) (Temporal)   Ht 5\' 11"  (1.803 m)   Wt 157 lb 8 oz (71.4 kg)   SpO2 97%   BMI 21.97 kg/m   GEN: No acute distress; alert,appropriate. PULM: Breathing comfortably in no respiratory distress PSYCH: Normally interactive.   Distinct primarily circular lesions that are raised and have a mild pinkish coloration they are probably on the order of 20 such lesions throughout the lower extremities and in the groin.  Laboratory and Imaging Data:  Assessment and Plan:     ICD-10-CM   1. Rash  R21      Has the appearance more of bug bites to me rather than poison ivy.  Treat as such.  Meds ordered this  encounter  Medications   triamcinolone ointment (KENALOG) 0.5 %    Sig: Apply 1 application topically 2 (two) times daily.    Dispense:  30 g    Refill:  1    Signed,  Langley Flatley T. Amy Gothard, MD   Outpatient Encounter Medications as of 08/24/2020  Medication Sig   acetaminophen (TYLENOL) 650 MG CR tablet Take 1,300 mg by mouth 2 (two) times daily.   amLODipine (NORVASC) 5 MG tablet Take 1 tablet (5 mg total) by mouth daily.   atorvastatin (LIPITOR) 80 MG tablet Take 1 tablet (80 mg total) by mouth every Monday, Wednesday, and Friday.   carvedilol (COREG) 6.25 MG tablet Take 1 tablet (6.25 mg total) by mouth 2 (two) times daily with a meal.   Continuous Blood Gluc Receiver (FREESTYLE LIBRE 14 DAY READER) DEVI Use as directed to check sugars QID AC HS   Continuous Blood Gluc Sensor (FREESTYLE LIBRE 14 DAY SENSOR) MISC USE AS DIRECTED TO  CHECK  SUGARS  FOUR  TIMES  DAILY  BEFORE  MEALS  AND  AT  BEDTIME   ezetimibe (ZETIA) 10 MG tablet Take 1 tablet (10 mg total) by mouth daily.   insulin detemir (LEVEMIR FLEXTOUCH) 100 UNIT/ML FlexPen Inject 13 Units into the skin daily.   levothyroxine (SYNTHROID) 88 MCG tablet Take  1 tablet (88 mcg total) by mouth daily.   lisinopril (ZESTRIL) 5 MG tablet Take 1 tablet (5 mg total) by mouth at bedtime.   nitroGLYCERIN (NITROSTAT) 0.4 MG SL tablet Place 1 tablet (0.4 mg total) under the tongue every 5 (five) minutes as needed for chest pain ((MAX of 3 doses)).   NOVOLOG FLEXPEN 100 UNIT/ML FlexPen Inject 3-4 Units into the skin 3 (three) times daily with meals.   oxymetazoline (AFRIN) 0.05 % nasal spray Place 1 spray into left nostril daily as needed (nose bleeds).   traZODone (DESYREL) 100 MG tablet TAKE 1 TABLET BY MOUTH AT BEDTIME AS NEEDED FOR SLEEP   triamcinolone ointment (KENALOG) 0.5 % Apply 1 application topically 2 (two) times daily.   Facility-Administered Encounter Medications as of 08/24/2020  Medication   sodium chloride flush (NS) 0.9 %  injection 10 mL

## 2020-08-24 NOTE — Telephone Encounter (Signed)
Spoke with pt scheduling OV today at 10:00 with Dr. Lorelei Pont.

## 2020-08-31 ENCOUNTER — Other Ambulatory Visit: Payer: Self-pay | Admitting: Family Medicine

## 2020-09-01 NOTE — Telephone Encounter (Signed)
Is this rx supposed to be DAW?

## 2020-09-01 NOTE — Telephone Encounter (Signed)
ERx 

## 2020-09-07 DIAGNOSIS — H401132 Primary open-angle glaucoma, bilateral, moderate stage: Secondary | ICD-10-CM | POA: Diagnosis not present

## 2020-09-07 DIAGNOSIS — H43822 Vitreomacular adhesion, left eye: Secondary | ICD-10-CM | POA: Diagnosis not present

## 2020-09-11 ENCOUNTER — Telehealth: Payer: Self-pay | Admitting: *Deleted

## 2020-09-11 NOTE — Telephone Encounter (Signed)
Pharmacist at [facility] left a voicemail stating that they received a script for a Libre freestyle on 8/11/221. Pharmacist stated that they are wondering if this should have been for the sensor. Pharmacist requested a call back to clarify what they need for the patient.

## 2020-09-12 ENCOUNTER — Other Ambulatory Visit: Payer: Self-pay | Admitting: Family Medicine

## 2020-09-13 MED ORDER — FREESTYLE LIBRE 14 DAY SENSOR MISC: 11 refills | Status: DC

## 2020-09-13 MED ORDER — FREESTYLE LIBRE 14 DAY READER DEVI: 1 refills | Status: DC

## 2020-09-13 NOTE — Telephone Encounter (Signed)
Spoke with pharmacist and confirmed that [facility] received RX for the sensor and not the freelibre style strips. Patient advised.

## 2020-09-13 NOTE — Addendum Note (Signed)
Addended by: [name] on: 09/13/2020 06:13 PM   Modules accepted: Orders

## 2020-09-13 NOTE — Telephone Encounter (Signed)
Patient left a voicemail stating that the pharmacist has reached out to the office regarding the sensor for his sugars and has  not heard anything back from the office. Patient stated that he runs out tomorrow. Patient requested that someone get in touch with [facility].

## 2020-09-13 NOTE — Telephone Encounter (Signed)
Pt called to follow up on refill, his sensor will expire tomorrow

## 2020-09-13 NOTE — Telephone Encounter (Signed)
Patient advised that this was taking care of and [place] confirmed that RX was received.

## 2020-09-13 NOTE — Telephone Encounter (Signed)
I've refilled both. Not sure what else pharmacy needs.

## 2020-09-14 NOTE — Telephone Encounter (Signed)
Per [place], everything has been taken care of.  Nothing further is needed.

## 2020-10-11 ENCOUNTER — Telehealth: Payer: Self-pay

## 2020-10-11 NOTE — Telephone Encounter (Signed)
108 pm.  Phone call made to patient to follow up on over all status and assess for concerns or needs.  No answer.  HIPPA compliant message has been left on his VM requesting a call back.

## 2020-10-31 DIAGNOSIS — U071 COVID-19: Secondary | ICD-10-CM | POA: Diagnosis not present

## 2020-11-09 ENCOUNTER — Telehealth: Payer: Self-pay | Admitting: *Deleted

## 2020-11-09 ENCOUNTER — Telehealth: Payer: Self-pay

## 2020-11-09 NOTE — Telephone Encounter (Signed)
Patient called asking if he can come in tomorrow for a lab check stating that he is off balance and thinks his blood may be low. Please advise

## 2020-11-09 NOTE — Telephone Encounter (Signed)
11:30AM: Palliative care SW outreached family for monthly telephonic visit and to schedule in person visit.  Call unsuccessful. SW left HIPPA compliant VM.  Awaiting return call. Will outreach again at later date and continue to offer palliative care support.

## 2020-11-10 ENCOUNTER — Inpatient Hospital Stay: Payer: Medicare Other | Attending: Oncology

## 2020-11-10 ENCOUNTER — Other Ambulatory Visit: Payer: Self-pay

## 2020-11-10 ENCOUNTER — Encounter: Payer: Self-pay | Admitting: Oncology

## 2020-11-10 DIAGNOSIS — E785 Hyperlipidemia, unspecified: Secondary | ICD-10-CM | POA: Insufficient documentation

## 2020-11-10 DIAGNOSIS — I129 Hypertensive chronic kidney disease with stage 1 through stage 4 chronic kidney disease, or unspecified chronic kidney disease: Secondary | ICD-10-CM | POA: Diagnosis not present

## 2020-11-10 DIAGNOSIS — E1122 Type 2 diabetes mellitus with diabetic chronic kidney disease: Secondary | ICD-10-CM | POA: Insufficient documentation

## 2020-11-10 DIAGNOSIS — D631 Anemia in chronic kidney disease: Secondary | ICD-10-CM | POA: Insufficient documentation

## 2020-11-10 DIAGNOSIS — Z8719 Personal history of other diseases of the digestive system: Secondary | ICD-10-CM | POA: Diagnosis not present

## 2020-11-10 DIAGNOSIS — R2681 Unsteadiness on feet: Secondary | ICD-10-CM | POA: Insufficient documentation

## 2020-11-10 DIAGNOSIS — H409 Unspecified glaucoma: Secondary | ICD-10-CM | POA: Diagnosis not present

## 2020-11-10 DIAGNOSIS — I2510 Atherosclerotic heart disease of native coronary artery without angina pectoris: Secondary | ICD-10-CM | POA: Insufficient documentation

## 2020-11-10 DIAGNOSIS — D696 Thrombocytopenia, unspecified: Secondary | ICD-10-CM | POA: Diagnosis not present

## 2020-11-10 DIAGNOSIS — C9110 Chronic lymphocytic leukemia of B-cell type not having achieved remission: Secondary | ICD-10-CM | POA: Diagnosis not present

## 2020-11-10 DIAGNOSIS — N183 Chronic kidney disease, stage 3 unspecified: Secondary | ICD-10-CM | POA: Diagnosis not present

## 2020-11-10 DIAGNOSIS — E039 Hypothyroidism, unspecified: Secondary | ICD-10-CM | POA: Diagnosis not present

## 2020-11-10 LAB — CBC WITH DIFFERENTIAL/PLATELET
Abs Immature Granulocytes: 0.01 10*3/uL (ref 0.00–0.07)
Basophils Absolute: 0 10*3/uL (ref 0.0–0.1)
Basophils Relative: 1 %
Eosinophils Absolute: 0.1 10*3/uL (ref 0.0–0.5)
Eosinophils Relative: 1 %
HCT: 36.9 % — ABNORMAL LOW (ref 39.0–52.0)
Hemoglobin: 12 g/dL — ABNORMAL LOW (ref 13.0–17.0)
Immature Granulocytes: 0 %
Lymphocytes Relative: 19 %
Lymphs Abs: 0.8 10*3/uL (ref 0.7–4.0)
MCH: 30.4 pg (ref 26.0–34.0)
MCHC: 32.5 g/dL (ref 30.0–36.0)
MCV: 93.4 fL (ref 80.0–100.0)
Monocytes Absolute: 0.4 10*3/uL (ref 0.1–1.0)
Monocytes Relative: 9 %
Neutro Abs: 3 10*3/uL (ref 1.7–7.7)
Neutrophils Relative %: 70 %
Platelets: 124 10*3/uL — ABNORMAL LOW (ref 150–400)
RBC: 3.95 MIL/uL — ABNORMAL LOW (ref 4.22–5.81)
RDW: 12.3 % (ref 11.5–15.5)
WBC: 4.3 10*3/uL (ref 4.0–10.5)
nRBC: 0 % (ref 0.0–0.2)

## 2020-11-10 LAB — COMPREHENSIVE METABOLIC PANEL
ALT: 35 U/L (ref 0–44)
AST: 27 U/L (ref 15–41)
Albumin: 4.6 g/dL (ref 3.5–5.0)
Alkaline Phosphatase: 88 U/L (ref 38–126)
Anion gap: 8 (ref 5–15)
BUN: 53 mg/dL — ABNORMAL HIGH (ref 8–23)
CO2: 26 mmol/L (ref 22–32)
Calcium: 9.8 mg/dL (ref 8.9–10.3)
Chloride: 107 mmol/L (ref 98–111)
Creatinine, Ser: 1.56 mg/dL — ABNORMAL HIGH (ref 0.61–1.24)
GFR, Estimated: 45 mL/min — ABNORMAL LOW (ref >60)
Glucose, Bld: 118 mg/dL — ABNORMAL HIGH (ref 70–99)
Potassium: 4.6 mmol/L (ref 3.5–5.1)
Sodium: 141 mmol/L (ref 135–145)
Total Bilirubin: 0.6 mg/dL (ref 0.3–1.2)
Total Protein: 7.2 g/dL (ref 6.5–8.1)

## 2020-11-10 LAB — SAMPLE TO BLOOD BANK

## 2020-11-10 NOTE — Telephone Encounter (Signed)
MyChart message sent to patient notifying him of hgb results

## 2020-11-17 ENCOUNTER — Inpatient Hospital Stay: Payer: Medicare Other

## 2020-11-17 ENCOUNTER — Encounter: Payer: Self-pay | Admitting: Oncology

## 2020-11-17 ENCOUNTER — Inpatient Hospital Stay (HOSPITAL_BASED_OUTPATIENT_CLINIC_OR_DEPARTMENT_OTHER): Payer: Medicare Other | Admitting: Oncology

## 2020-11-17 VITALS — BP 131/73 | HR 57 | Temp 97.5°F | Resp 18 | Wt 152.0 lb

## 2020-11-17 DIAGNOSIS — N183 Chronic kidney disease, stage 3 unspecified: Secondary | ICD-10-CM | POA: Diagnosis not present

## 2020-11-17 DIAGNOSIS — C9110 Chronic lymphocytic leukemia of B-cell type not having achieved remission: Secondary | ICD-10-CM | POA: Diagnosis not present

## 2020-11-17 DIAGNOSIS — D649 Anemia, unspecified: Secondary | ICD-10-CM | POA: Diagnosis not present

## 2020-11-17 DIAGNOSIS — D696 Thrombocytopenia, unspecified: Secondary | ICD-10-CM

## 2020-11-17 DIAGNOSIS — I129 Hypertensive chronic kidney disease with stage 1 through stage 4 chronic kidney disease, or unspecified chronic kidney disease: Secondary | ICD-10-CM | POA: Diagnosis not present

## 2020-11-17 DIAGNOSIS — E1122 Type 2 diabetes mellitus with diabetic chronic kidney disease: Secondary | ICD-10-CM | POA: Diagnosis not present

## 2020-11-17 DIAGNOSIS — D631 Anemia in chronic kidney disease: Secondary | ICD-10-CM | POA: Diagnosis not present

## 2020-11-17 LAB — CBC WITH DIFFERENTIAL/PLATELET
Abs Immature Granulocytes: 0.01 10*3/uL (ref 0.00–0.07)
Basophils Absolute: 0 10*3/uL (ref 0.0–0.1)
Basophils Relative: 0 %
Eosinophils Absolute: 0.1 10*3/uL (ref 0.0–0.5)
Eosinophils Relative: 2 %
HCT: 35.1 % — ABNORMAL LOW (ref 39.0–52.0)
Hemoglobin: 11.1 g/dL — ABNORMAL LOW (ref 13.0–17.0)
Immature Granulocytes: 0 %
Lymphocytes Relative: 20 %
Lymphs Abs: 0.8 10*3/uL (ref 0.7–4.0)
MCH: 29.7 pg (ref 26.0–34.0)
MCHC: 31.6 g/dL (ref 30.0–36.0)
MCV: 93.9 fL (ref 80.0–100.0)
Monocytes Absolute: 0.4 10*3/uL (ref 0.1–1.0)
Monocytes Relative: 10 %
Neutro Abs: 2.6 10*3/uL (ref 1.7–7.7)
Neutrophils Relative %: 68 %
Platelets: 124 10*3/uL — ABNORMAL LOW (ref 150–400)
RBC: 3.74 MIL/uL — ABNORMAL LOW (ref 4.22–5.81)
RDW: 12.4 % (ref 11.5–15.5)
WBC: 3.8 10*3/uL — ABNORMAL LOW (ref 4.0–10.5)
nRBC: 0 % (ref 0.0–0.2)

## 2020-11-17 LAB — COMPREHENSIVE METABOLIC PANEL
ALT: 22 U/L (ref 0–44)
AST: 21 U/L (ref 15–41)
Albumin: 4.2 g/dL (ref 3.5–5.0)
Alkaline Phosphatase: 82 U/L (ref 38–126)
Anion gap: 10 (ref 5–15)
BUN: 39 mg/dL — ABNORMAL HIGH (ref 8–23)
CO2: 26 mmol/L (ref 22–32)
Calcium: 9.8 mg/dL (ref 8.9–10.3)
Chloride: 103 mmol/L (ref 98–111)
Creatinine, Ser: 1.45 mg/dL — ABNORMAL HIGH (ref 0.61–1.24)
GFR, Estimated: 49 mL/min — ABNORMAL LOW (ref >60)
Glucose, Bld: 214 mg/dL — ABNORMAL HIGH (ref 70–99)
Potassium: 5 mmol/L (ref 3.5–5.1)
Sodium: 139 mmol/L (ref 135–145)
Total Bilirubin: 0.7 mg/dL (ref 0.3–1.2)
Total Protein: 6.9 g/dL (ref 6.5–8.1)

## 2020-11-17 NOTE — Progress Notes (Signed)
[facility]  [phone] [phone]  ID: Scott [name] OB: [birth year]  MR#: [id number]  JFH#:[id number]  Patient Care Team: [name], MD as PCP - General (Family Medicine) Dorothy Spark, MD (Inactive) as PCP - Cardiology (Cardiology) Lloyd Huger, MD as Consulting Physician (Oncology) Clent Jacks, MD as Consulting Physician (Ophthalmology) Dorothy Spark, MD (Inactive) as Consulting Physician (Cardiology) Madelon Lips, MD as Consulting Physician (Nephrology)   CHIEF COMPLAINT: 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  History of present illness- Scott [name] is a 80 year old male with past medical history significant for hypertension, CAD, dysphagia, hypothyroidism, hyperlipidemia, diabetes with stage III CKD, BPV, hearing loss, cervical spondylosis, insomnia, glaucoma who is followed by Dr. [name] for anemia and CKD and CLL.  He is followed very closely by his PCP Dr. Danise Mina.  He last saw him on 08/18/2020.  He is currently on sliding scale insulin for type 2 diabetes.  Appears to be controlled.  He was treated for a rash on 08/24/2020 thought to be due to poison ivy.  He was given steroid cream.  He is followed by [facility] for palliative care.   INTERVAL HISTORY: Scott [name] is feeling well today.  States he has had some balance issues but this only happens occasionally.  He see his PCP for this.  He is trying to get his blood pressure and blood sugar under control.  He recently had a sensor placed in his arm for his blood sugar.  This has been helpful.  He is leaving for the beach next week and is excited.  Denies any new concerns.  REVIEW OF SYSTEMS:   Review of Systems  Neurological:  Positive for dizziness.         Occasional unsteadiness  All other systems reviewed and are negative.  As per HPI. Otherwise, a complete review of systems is negative.  PAST MEDICAL HISTORY: Past Medical History:   Diagnosis Date   Acquired hand deformity 1962   hand saw accident at work   Anemia 10/11/2011   Arthritis    Bradycardia 10/10/2011   CAD (coronary artery disease) 10/10/2011   MI s/p PTCA (Dx-OM2 proximal concentric stenosis)   Carotid artery disease (HCC)    Cellulitis of buttock, left 11/17/2018   Chronic edema    CKD (chronic kidney disease) stage 3, GFR 30-59 ml/min (HCC)    [name]   CLL (chronic lymphocytic leukemia) ([place])    Colon polyps    Cough 04/28/2019   Generalized headaches    frequent   GI bleed 07/08/2017   Glaucoma    s/p laser surgery   HLD (hyperlipidemia)    Hyperkalemia 02/28/2018   Hypertension    Hypothyroidism 10/10/2011   Iron deficiency anemia due to chronic blood loss    Squamous cell carcinoma of skin 06/15/2019   Right posterior ear. SCCis, hypertrophic, crusted   Thrombocytopenia ([place]) 10/11/2011   Type 2 diabetes with nephropathy [facility])    DM refresher course [place] (04/2013)    PAST SURGICAL HISTORY: Past Surgical History:  Procedure Laterality Date   BACK SURGERY     cervical neck   CATARACT EXTRACTION, BILATERAL Bilateral 2017   COLONOSCOPY  11/2008   1 polyp, diverticulosis, rec rpt 5 yrs (Dr. Oletta Lamas, [name])   COLONOSCOPY  06/2014   hyperplastic polyp, rpt 5 yrs Oletta Lamas)   COLONOSCOPY WITH PROPOFOL N/A 11/17/2017   TA, HP, (Vanga, Tally Due, MD)   ESOPHAGOGASTRODUODENOSCOPY (EGD) WITH PROPOFOL N/A 11/17/2017  healing erosive gastritis, intestinal metaplasia, neg H pylori (Vanga, Tally Due, MD)   ESOPHAGOGASTRODUODENOSCOPY (EGD) WITH PROPOFOL N/A 06/28/2019   erosive gastropathy with stigmata of recent bleed, granular tissue in esophagus biopsied- reflux esophagitis, small HH (Vanga, Tally Due, MD)   EYE SURGERY  2012   laser surgery for glaucoma   PORTACATH PLACEMENT Left 12/09/2018   Procedure: INSERTION PORT-A-CATH;  Surgeon: [name], MD;  Location: [facility] ORS;  Service: General;  Laterality: Left;   PTCA  1994, 1995   US  ECHOCARDIOGRAPHY  10/2013   inferior wall hypokinesis, mild LVH, EF 50-55%, mild MR and LA dilation    FAMILY HISTORY: Family History  Problem Relation Age of Onset   Diabetes Sister    Stomach cancer Sister 70   Pneumonia Father        caused death   Other Brother        no communication with brother so unsure of any health conditions   Coronary artery disease Son 60       5v CABG and stents   Hyperlipidemia Sister    Stroke Neg Hx    Heart attack Neg Hx     ADVANCED DIRECTIVES (Y/N):  N  HEALTH MAINTENANCE: Social History   Tobacco Use   Smoking status: Former    Types: Cigarettes    Quit date: 03/04/1978    Years since quitting: 42.7   Smokeless tobacco: Never  Vaping Use   Vaping Use: Never used  Substance Use Topics   Alcohol use: No   Drug use: No     Colonoscopy:  PAP:  Bone density:  Lipid panel:  No Known Allergies  Current Outpatient Medications  Medication Sig Dispense Refill   acetaminophen (TYLENOL) 650 MG CR tablet Take 1,300 mg by mouth 2 (two) times daily.     amLODipine (NORVASC) 5 MG tablet Take 1 tablet (5 mg total) by mouth daily. 90 tablet 3   atorvastatin (LIPITOR) 80 MG tablet Take 1 tablet (80 mg total) by mouth every Monday, Wednesday, and Friday. 39 tablet 3   carvedilol (COREG) 6.25 MG tablet Take 1 tablet (6.25 mg total) by mouth 2 (two) times daily with a meal. 180 tablet 3   Continuous Blood Gluc Receiver (FREESTYLE LIBRE 14 DAY READER) DEVI Use as directed to check sugars QID AC HS 2 each 1   Continuous Blood Gluc Sensor (FREESTYLE LIBRE 14 DAY SENSOR) MISC Use as directed to check sugars QID HS 2 each 11   ezetimibe (ZETIA) 10 MG tablet Take 1 tablet (10 mg total) by mouth daily. 90 tablet 3   insulin detemir (LEVEMIR FLEXTOUCH) 100 UNIT/ML FlexPen Inject 13 Units into the skin daily. 15 mL    levothyroxine (SYNTHROID) 88 MCG tablet Take 1 tablet (88 mcg total) by mouth daily. 90 tablet 3   lisinopril (ZESTRIL) 5 MG tablet Take 1  tablet (5 mg total) by mouth at bedtime. 90 tablet 3   nitroGLYCERIN (NITROSTAT) 0.4 MG SL tablet Place 1 tablet (0.4 mg total) under the tongue every 5 (five) minutes as needed for chest pain ((MAX of 3 doses)). 25 tablet 0   NOVOLOG FLEXPEN 100 UNIT/ML FlexPen Inject 3-4 Units into the skin 3 (three) times daily with meals. 15 mL 1   oxymetazoline (AFRIN) 0.05 % nasal spray Place 1 spray into left nostril daily as needed (nose bleeds).     traZODone (DESYREL) 100 MG tablet TAKE 1 TABLET BY MOUTH AT BEDTIME AS NEEDED FOR SLEEP 90  tablet 0   triamcinolone ointment (KENALOG) 0.5 % Apply 1 application topically 2 (two) times daily. 30 g 1   No current facility-administered medications for this visit.   Facility-Administered Medications Ordered in Other Visits  Medication Dose Route Frequency Provider Last Rate Last Admin   sodium chloride flush (NS) 0.9 % injection 10 mL  10 mL Intravenous Once Lloyd Huger, MD        OBJECTIVE: Vitals:   11/17/20 1042  BP: 131/73  Pulse: (!) 57  Resp: 18  Temp: (!) 97.5 F (36.4 C)  SpO2: 98%     Body mass index is 21.2 kg/m.    ECOG FS:0 - Asymptomatic  Physical Exam Constitutional:      Appearance: Normal appearance.  HENT:     Head: Normocephalic and atraumatic.  Eyes:     Pupils: Pupils are equal, round, and reactive to light.  Cardiovascular:     Rate and Rhythm: Normal rate and regular rhythm.     Heart sounds: Normal heart sounds. No murmur heard. Pulmonary:     Effort: Pulmonary effort is normal.     Breath sounds: Normal breath sounds. No wheezing.  Abdominal:     General: Bowel sounds are normal. There is no distension.     Palpations: Abdomen is soft.     Tenderness: There is no abdominal tenderness.  Musculoskeletal:        General: Normal range of motion.     Cervical back: Normal range of motion.  Skin:    General: Skin is warm and dry.     Findings: No rash.  Neurological:     Mental Status: He is alert and  oriented to person, place, and time.  Psychiatric:        Judgment: Judgment normal.     LAB RESULTS:  Lab Results  Component Value Date   NA 139 11/17/2020   K 5.0 11/17/2020   CL 103 11/17/2020   CO2 26 11/17/2020   GLUCOSE 214 (H) 11/17/2020   BUN 39 (H) 11/17/2020   CREATININE 1.45 (H) 11/17/2020   CALCIUM 9.8 11/17/2020   PROT 6.9 11/17/2020   ALBUMIN 4.2 11/17/2020   AST 21 11/17/2020   ALT 22 11/17/2020   ALKPHOS 82 11/17/2020   BILITOT 0.7 11/17/2020   GFRNONAA 49 (L) 11/17/2020   GFRAA 42 (L) 10/28/2019    Lab Results  Component Value Date   WBC 3.8 (L) 11/17/2020   NEUTROABS 2.6 11/17/2020   HGB 11.1 (L) 11/17/2020   HCT 35.1 (L) 11/17/2020   MCV 93.9 11/17/2020   PLT 124 (L) 11/17/2020     STUDIES: No results found.  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  PLAN:    1. CLL:  Patient had bone marrow biopsy on 07/28/2018 which revealed 73% of his marrow involved CLL.  Peripheral blood FISH showed 50% incidence of mutation in the ATM gene which is common in 11 q.  11 q. is unfavorable prognosis and high risk of not responding to initial treatment.  13 q. is a more common mutation is actually associated with a more favorable prognosis.  He unfortunately had progression of disease in the marrow along with worsening transfusion requirement.  He received 5 weekly cycles of Rituxan with mild improvement of his platelet count.  He received 4 additional cycles of Rituxan plus Treanda completing treatment on 01/12/2019.  He remains in good partial remission.  No intervention is needed at this time.  Labs from 11/10/2020 show hemoglobin of 12.0 and platelet count of 124,000.  Continue observation at this time.  RTC in 3 months for lab work only in 6 months for lab work and assessment.  2.  Thrombocytopenia:  Labs from 11/10/2020 show platelet count of 124,000.  This is stable.  He is previously received prednisone, IVIG and Rituxan.  3.  Anemia:   Likely secondary to CKD.  Hemoglobin is 12.0.  This is stable.  Previously had work-up including colonoscopy from 11/17/2017 that showed 2 nonmalignant polyps.  EGD revealed nonbleeding erosive gastropathy with multiple nonbleeding duodenal ulcers.  Continue follow-up with GI as recommended.  4.  Reaction to Rituxan:  Developed a rate based reaction and will require premedication for future Rituxan infusions.  These are in his treatment plan.  5.  Chronic renal insufficiency:  Followed by primary care.  Most recent creatinine is 1.56 (1.75).  Continue monitoring.  6.  Balance issues: New.  Blood pressure and blood sugar issues likely contributing.  Lab work from today stable.  Recommend changing positions slowly.  Patient will follow-up with PCP.  Disposition- Continue to monitor for now.  RTC in 3 months for lab work only and (CBC, CMP and hold tube) and in 6 months for lab work and assessment.  I spent 20 minutes dedicated to the care of this patient (face-to-face and non-face-to-face) on the date of the encounter to include what is described in the assessment and plan.  Patient expressed understanding and was in agreement with this plan. He also understands that He can call clinic at any time with any questions, concerns, or complaints.    Jacquelin Hawking, NP   11/17/2020 11:45 AM

## 2020-11-23 ENCOUNTER — Other Ambulatory Visit: Payer: Self-pay | Admitting: Family Medicine

## 2020-12-07 ENCOUNTER — Other Ambulatory Visit: Payer: Self-pay

## 2020-12-07 ENCOUNTER — Ambulatory Visit (INDEPENDENT_AMBULATORY_CARE_PROVIDER_SITE_OTHER): Payer: Medicare Other | Admitting: Gastroenterology

## 2020-12-07 ENCOUNTER — Encounter: Payer: Self-pay | Admitting: Gastroenterology

## 2020-12-07 VITALS — BP 176/71 | HR 57 | Temp 98.6°F | Ht 71.0 in | Wt 152.2 lb

## 2020-12-07 DIAGNOSIS — R109 Unspecified abdominal pain: Secondary | ICD-10-CM | POA: Diagnosis not present

## 2020-12-07 DIAGNOSIS — K8689 Other specified diseases of pancreas: Secondary | ICD-10-CM

## 2020-12-07 NOTE — Patient Instructions (Addendum)
TAKE THE CREON TWO PILLS WITH FIRST BITE OF EACH MEAL AND ONLY TAKE ONE PILL WITH FIRST BITE OF [facility]

## 2020-12-07 NOTE — Progress Notes (Signed)
Scott Darby, MD [address]  [place]  [place], [place] [address]  Main: [phone]  Fax: [phone]    Gastroenterology Consultation  Referring Provider:     [name], MD Primary Care Physician:  [name], MD Primary Gastroenterologist:  Dr. Cephas [name] Reason for Consultation:     Central abdominal pain        HPI:   Scott [name] is a 80 y.o. male referred by Dr. [name], MD  for consultation & management of and deficiency anemia. Patient was admitted to [facility] in 07/2017 with symptomatic anemia, hemoglobin 5.5, platelets of 8 with known history of CLL. His pancytopenia was thought to be secondary to CLL. Endoscopic evaluation was not performed during that admission because of severe Thrombocytopenia in the setting of CLL.patient received prednisone and IVIG as inpatient. Patient is closely followed by Dr. [name] at [facility] and started on rituximab, received 2 infusions. His platelet count is 55 today, hemoglobin 10.4. He also has severe iron deficiency as well as borderline low B12 levels. He received parenteral iron therapy as well as oral B12. His ferritin levels improved as well as B12 levels. Patient is here to discuss about endoscopy evaluation. He denies abdominal pain, nausea, vomiting, rectal bleeding, melena, hematemesis, hematochezia. He stopped oral iron and B12  Follow-up visit 12/05/2017: Patient finished treatment for his CLL with rituximab.  His thrombocytopenia improved.  He underwent EGD and colonoscopy for iron deficiency anemia.  Found to have gastric erosions and duodenal ulcers.  There was no evidence of H. pylori.  Colonoscopy did not reveal evidence of iron deficiency anemia, terminal ileum normal.  Patient denies any bleeding episodes such as hematemesis, hematochezia, epistaxis, rectal bleeding.  He is being followed by Dr. [name] for his CLL.  He denies any GI symptoms.  He is not on  any acid suppression medication.  Follow-up visit 06/01/2019 Patient reports doing well from CLL standpoint.  He last chemo was 3 months ago.  He is here for follow-up of dysphagia and gastric intestinal metaplasia.  He reports that whenever he tries to eat any dry food, he feels stuck in his throat which goes down smoothly with water.  His last upper endoscopy did not reveal any structural lesions in the esophagus.  He thought he needed a colonoscopy per Dr. [name].  Patient is currently not on any PPI.  He denies any other GI symptoms.  He is no longer iron deficient.  His B12 levels are normal.  Follow-up visit 10/26/2019 Patient reports that he has intermittent difficulty swallowing, food getting stuck only to certain dry foods such as steak.  He tries to eat in small pieces and drink water, food eventually passes down.  He denies regurgitation.  He is in fact gaining weight.  He does not have any other concerns today  Follow-up visit 12/07/2020 Patient is here to discuss about central abdominal pain that has been ongoing for few months.  The pain is predominantly postprandial, last for about 2 hours.  He lost about 10 pounds since last year.  Patient does have history of diffuse pancreatic atrophy with partial fatty replacement based on CT in 08/2017.  Patient denies any loose stools or abdominal bloating.  His CBC, CMP have been stable.  Patient has not been taking PPI.  Patient denies any heartburn, epigastric pain  NSAIDs:  none  Antiplts/Anticoagulants/Anti thrombotics: none  GI Procedures: underwent colonoscopy in 2010, subcentimeter polyps were removed  Underwent colonoscopy in 2016 in [place], 10 mm polyp from ascending colon was removed  EGD and colonoscopy 11/2017 - Multiple non-bleeding duodenal ulcers with a clean ulcer base ([place] Class III). - Non-bleeding erosive gastropathy. - Non-bleeding erosive gastropathy. Biopsied. - Small hiatal hernia. - Normal gastroesophageal  junction and esophagus.  - Two 5 to 7 mm polyps in the transverse colon and in the cecum, removed with a hot snare. Resected and retrieved. - Diverticulosis in the sigmoid colon, in the transverse colon and in the ascending colon. - Increased mucosa vascular pattern in the rectum, prominent rectal veins - Normal mucosa in the entire examined colon.  DIAGNOSIS:   A. STOMACH, RANDOM; COLD BIOPSY:  - ANTRAL MUCOSA WITH REACTIVE AND HEALING EROSIVE GASTRITIS; FOCAL  INTESTINAL METAPLASIA IS PRESENT.  - OXYNTIC MUCOSA WITH MODERATE CHRONIC GASTRITIS AND CHANGES CONSISTENT  WITH PROTON PUMP INHIBITOR EFFECT.  - IHC FOR HELICOBACTER IS NEGATIVE.  - NEGATIVE FOR DYSPLASIA AND MALIGNANCY.   B.  COLON POLYP, CECUM; HOT SNARE:  - TUBULAR ADENOMA.  - NEGATIVE FOR HIGH-GRADE DYSPLASIA AND MALIGNANCY.   C.  COLON POLYP, TRANSVERSE; HOT SNARE:  - HYPERPLASTIC POLYP.  - NEGATIVE FOR DYSPLASIA AND MALIGNANCY.   Upper endoscopy 06/28/2019 - Normal duodenal bulb and second portion of the duodenum. - Erosive gastropathy with stigmata of recent bleeding. Biopsied. - Normal gastric body. Biopsied. - A medium amount of food (residue) in the stomach. - Small hiatal hernia. - Granular mucosa in the esophagus. Biopsied. - Normal esophagus.  DIAGNOSIS:  A.  STOMACH, INCISURA; COLD BIOPSY:  - ANTRAL-TYPE AND TRANSITIONAL-TYPE MUCOSA WITH CONGESTION AND MILD  REACTIVE FOVEOLAR HYPERPLASIA.  - NEGATIVE FOR H. PYLORI, INTESTINAL METAPLASIA, DYSPLASIA, AND  MALIGNANCY.   B.  STOMACH, ANTRUM GREATER CURVATURE; COLD BIOPSY:  - OXYNTIC AND ANTRAL MUCOSA WITH MILD CHRONIC INFLAMMATION.  - NEGATIVE FOR H. PYLORI, INTESTINAL METAPLASIA, DYSPLASIA, AND  MALIGNANCY.   C.  STOMACH, ANTRUM LESSER CURVATURE; COLD BIOPSY:  - OXYNTIC MUCOSA WITH EDEMA.  - NEGATIVE FOR H. PYLORI, INTESTINAL METAPLASIA, DYSPLASIA, AND  MALIGNANCY.   D.  STOMACH, BODY GREATER CURVATURE; COLD BIOPSY:  - OXYNTIC MUCOSA WITH FOCAL  INTESTINAL METAPLASIA INVOLVING 10% OF ONE  OF TWO FRAGMENTS, SEE COMMENT.  - NEGATIVE FOR H. PYLORI, DYSPLASIA, AND MALIGNANCY.   E.  STOMACH, BODY LESSER CURVATURE; COLD BIOPSY:  - OXYNTIC MUCOSA WITH EDEMA.  - NEGATIVE FOR H. PYLORI, INTESTINAL METAPLASIA, DYSPLASIA, AND  MALIGNANCY.   F.  GASTROESOPHAGEAL JUNCTION; COLD BIOPSY:  - SQUAMOUS-LINED MUCOSA WITH PARAKERATOSIS AND ACTIVE INFLAMMATION,  CONSISTENT WITH REFLUX ESOPHAGITIS.  - COLUMNAR-LINED MUCOSA WITH MILD CHRONIC INFLAMMATION AND REACTIVE  FOVEOLAR HYPERPLASIA.  - NEGATIVE FOR INTESTINAL METAPLASIA (GOBLET CELLS), DYSPLASIA, AND  MALIGNANCY.   Past Medical History:  Diagnosis Date   Acquired hand deformity 1962   hand saw accident at work   Anemia 10/11/2011   Arthritis    Bradycardia 10/10/2011   CAD (coronary artery disease) 10/10/2011   MI s/p PTCA (Dx-OM2 proximal concentric stenosis)   Carotid artery disease (HCC)    Cellulitis of buttock, left 11/17/2018   Chronic edema    CKD (chronic kidney disease) stage 3, GFR 30-59 ml/min (HCC)    [name]   CLL (chronic lymphocytic leukemia) ([place])    Colon polyps    Cough 04/28/2019   Generalized headaches    frequent   GI bleed 07/08/2017   Glaucoma    s/p laser surgery   HLD (hyperlipidemia)    Hyperkalemia 02/28/2018  Hypertension    Hypothyroidism 10/10/2011   Iron deficiency anemia due to chronic blood loss    Squamous cell carcinoma of skin 06/15/2019   Right posterior ear. SCCis, hypertrophic, crusted   Thrombocytopenia ([place]) 10/11/2011   Type 2 diabetes with nephropathy [facility])    DM refresher course [place] (04/2013)    Past Surgical History:  Procedure Laterality Date   BACK SURGERY     cervical neck   CATARACT EXTRACTION, BILATERAL Bilateral 2017   COLONOSCOPY  11/2008   1 polyp, diverticulosis, rec rpt 5 yrs (Dr. Oletta Lamas, [name])   COLONOSCOPY  06/2014   hyperplastic polyp, rpt 5 yrs Oletta Lamas)   COLONOSCOPY WITH PROPOFOL N/A 11/17/2017   TA, HP, (Jennylee Uehara,  Tally Due, MD)   ESOPHAGOGASTRODUODENOSCOPY (EGD) WITH PROPOFOL N/A 11/17/2017   healing erosive gastritis, intestinal metaplasia, neg H pylori (Mushka Laconte, Tally Due, MD)   ESOPHAGOGASTRODUODENOSCOPY (EGD) WITH PROPOFOL N/A 06/28/2019   erosive gastropathy with stigmata of recent bleed, granular tissue in esophagus biopsied- reflux esophagitis, small HH (Mashell Sieben, Tally Due, MD)   EYE SURGERY  2012   laser surgery for glaucoma   PORTACATH PLACEMENT Left 12/09/2018   Procedure: INSERTION PORT-A-CATH;  Surgeon: [name], MD;  Location: [facility] ORS;  Service: General;  Laterality: Left;   PTCA  1994, 1995   US ECHOCARDIOGRAPHY  10/2013   inferior wall hypokinesis, mild LVH, EF 50-55%, mild MR and LA dilation    Current Outpatient Medications:    acetaminophen (TYLENOL) 650 MG CR tablet, Take 1,300 mg by mouth 2 (two) times daily., Disp: , Rfl:    amLODipine (NORVASC) 5 MG tablet, Take 1 tablet (5 mg total) by mouth daily., Disp: 90 tablet, Rfl: 3   atorvastatin (LIPITOR) 80 MG tablet, Take 1 tablet (80 mg total) by mouth every Monday, Wednesday, and Friday., Disp: 39 tablet, Rfl: 3   carvedilol (COREG) 6.25 MG tablet, Take 1 tablet (6.25 mg total) by mouth 2 (two) times daily with a meal., Disp: 180 tablet, Rfl: 3   Continuous Blood Gluc Receiver (FREESTYLE LIBRE 14 DAY READER) DEVI, Use as directed to check sugars QID AC HS, Disp: 2 each, Rfl: 1   Continuous Blood Gluc Sensor (FREESTYLE LIBRE 14 DAY SENSOR) MISC, Use as directed to check sugars QID HS, Disp: 2 each, Rfl: 11   ezetimibe (ZETIA) 10 MG tablet, Take 1 tablet (10 mg total) by mouth daily., Disp: 90 tablet, Rfl: 3   insulin detemir (LEVEMIR FLEXTOUCH) 100 UNIT/ML FlexPen, Inject 13 Units into the skin daily., Disp: 15 mL, Rfl:    levothyroxine (SYNTHROID) 88 MCG tablet, Take 1 tablet (88 mcg total) by mouth daily., Disp: 90 tablet, Rfl: 3   lisinopril (ZESTRIL) 5 MG tablet, Take 1 tablet (5 mg total) by mouth at bedtime., Disp: 90  tablet, Rfl: 3   nitroGLYCERIN (NITROSTAT) 0.4 MG SL tablet, Place 1 tablet (0.4 mg total) under the tongue every 5 (five) minutes as needed for chest pain ((MAX of 3 doses))., Disp: 25 tablet, Rfl: 0   NOVOLOG FLEXPEN 100 UNIT/ML FlexPen, Inject 3-4 Units into the skin 3 (three) times daily with meals., Disp: 15 mL, Rfl: 1   oxymetazoline (AFRIN) 0.05 % nasal spray, Place 1 spray into left nostril daily as needed (nose bleeds)., Disp: , Rfl:    traZODone (DESYREL) 100 MG tablet, TAKE 1 TABLET BY MOUTH AT BEDTIME AS NEEDED FOR SLEEP, Disp: 90 tablet, Rfl: 0   triamcinolone ointment (KENALOG) 0.5 %, Apply 1 application topically 2 (two) times  daily., Disp: 30 g, Rfl: 1 No current facility-administered medications for this visit.  Facility-Administered Medications Ordered in Other Visits:    sodium chloride flush (NS) 0.9 % injection 10 mL, 10 mL, Intravenous, Once, Finnegan, Kathlene November, MD    Family History  Problem Relation Age of Onset   Diabetes Sister    Stomach cancer Sister 48   Pneumonia Father        caused death   Other Brother        no communication with brother so unsure of any health conditions   Coronary artery disease Son 7       5v CABG and stents   Hyperlipidemia Sister    Stroke Neg Hx    Heart attack Neg Hx      Social History   Tobacco Use   Smoking status: Former    Types: Cigarettes    Quit date: 03/04/1978    Years since quitting: 42.7   Smokeless tobacco: Never  Vaping Use   Vaping Use: Never used  Substance Use Topics   Alcohol use: No   Drug use: No    Allergies as of 12/07/2020   (No Known Allergies)    Review of Systems:    All systems reviewed and negative except where noted in HPI.   Physical Exam:  BP (!) 176/71 (BP Location: Right Arm, Patient Position: Sitting, Cuff Size: Normal)   Pulse (!) 57   Temp 98.6 F (37 C) (Oral)   Ht 5\' 11"  (1.803 m)   Wt 152 lb 3.2 oz (69 kg)   BMI 21.23 kg/m  No LMP for male patient.  General:    Alert,  Well-developed, well-nourished, pleasant and cooperative in NAD Head:  Normocephalic and atraumatic. Eyes:  Sclera clear, no icterus.   Conjunctiva pink. Ears:  Normal auditory acuity, lesion behind the right auricle. Nose:  No deformity, discharge, or lesions. Mouth:  No deformity or lesions,oropharynx pink & moist. Neck:  Supple; no masses or thyromegaly. Lungs:  Respirations even and unlabored.  Clear throughout to auscultation.   No wheezes, crackles, or rhonchi. No acute distress. Heart:  Regular rate and rhythm; no murmurs, clicks, rubs, or gallops. Abdomen:  Normal bowel sounds. Soft, mild periumbilical tenderness and non-distended without masses, hepatosplenomegaly or hernias noted.  No guarding or rebound tenderness.   Rectal: Not performed Msk:  Symmetrical without gross deformities. Good, equal movement & strength bilaterally. Pulses:  Normal pulses noted. Extremities:  No clubbing or edema.  No cyanosis. Neurologic:  Alert and oriented x3;  grossly normal neurologically. Skin: Dry scaly skin with maculopapular rash, petechiae no jaundice. Lymph Nodes:  No significant cervical adenopathy. Psych:  Alert and cooperative. Normal mood and affect.  Imaging Studies: No abdominal imaging  Assessment and Plan:   EIVIN MASCIO is a 80 y.o. Caucasian male with CLL with anemia and Thrombocytopenia, s/p rituximab, history of iron deficiency anemia which has currently resolved, history of focal intestinal metaplasia of the stomach, duodenal ulcers which have resolved, tubular adenoma of colon is seen for follow-up of central abdominal pain with weight loss.   Central abdominal pain with weight loss History of diffuse pancreatic atrophy and fatty pancreas Trial of Creon samples Check pancreatic fecal elastase levels If the symptoms are persistent with ongoing weight loss, will discuss with him about repeating upper endoscopy and cross-sectional imaging  Patient is no longer iron  deficient.  He does have mild normocytic anemia secondary to underlying CLL  Focal intestinal metaplasia  in stomach: Only 1 fragment H. pylori IgG normal  Dysphagia: Intermittently to hard foods only, however, patient is gaining weight EGD revealed normal esophagus.  GE junction biopsies revealed reflux esophagitis. Will observe for 3 to 6 months, and if symptoms are progressive, l refer to esophageal manometry to evaluate for dysmotility disorder Continue omeprazole 40 mg daily before breakfast  Personal history of colon adenoma in 2019: Repeat colonoscopy in 2024  Follow up in 3 to 4 months   Scott Darby, MD

## 2020-12-12 ENCOUNTER — Telehealth: Payer: Self-pay

## 2020-12-12 DIAGNOSIS — R109 Unspecified abdominal pain: Secondary | ICD-10-CM | POA: Diagnosis not present

## 2020-12-12 MED ORDER — PANCRELIPASE (LIP-PROT-AMYL) 36000-114000 UNITS PO CPEP: ORAL_CAPSULE | ORAL | 2 refills | Status: DC

## 2020-12-12 NOTE — Telephone Encounter (Signed)
Patient dropped off his stool samples today and told Juliann Pulse the Creon was helping him and wanted to know if a prescription could be called in for him to the pharmacy

## 2020-12-12 NOTE — Telephone Encounter (Signed)
Agree with Creon or Zenpep standard dose, whichever is covered by his insurance  [facility]

## 2020-12-12 NOTE — Addendum Note (Signed)
Addended by: Ulyess Blossom L on: 12/12/2020 02:58 PM   Modules accepted: Orders

## 2020-12-12 NOTE — Telephone Encounter (Signed)
It looks like creon is covered by patient insurance. Sent creon to the pharmacy. Called patient and left a detail message informing him of this information

## 2020-12-13 ENCOUNTER — Telehealth: Payer: Self-pay | Admitting: Gastroenterology

## 2020-12-13 MED ORDER — ZENPEP 40000-126000 UNITS PO CPEP: ORAL_CAPSULE | ORAL | 3 refills | Status: DC

## 2020-12-13 NOTE — Telephone Encounter (Signed)
Patient states the creon was 700 dollars a month. He states he can not afford that. Sent in Zenpep to see if this is cheaper medication. Called the pharmacy and the Zenpep is 1050 dollars. Patient wants to know what you recommend

## 2020-12-13 NOTE — Telephone Encounter (Signed)
Per Dr. Marius Ditch we can apply for patient assistance for the creon. Called patient and he states he will fill out the form and bring back his tax records for [place] to fax the the creon people. Also gave patient more samples of creon

## 2020-12-13 NOTE — Telephone Encounter (Signed)
Pt. Calling he has questions and concerns about his medication. Requesting a call back

## 2020-12-13 NOTE — Telephone Encounter (Signed)
Does not look like patient has turned in his pancreatic elastase stool kit.

## 2020-12-14 ENCOUNTER — Telehealth: Payer: Self-pay

## 2020-12-14 NOTE — Telephone Encounter (Signed)
Faxed patient assistance form for the Creon to the patient assistance company

## 2020-12-18 ENCOUNTER — Other Ambulatory Visit: Payer: Self-pay

## 2020-12-18 ENCOUNTER — Encounter: Payer: Self-pay | Admitting: Family Medicine

## 2020-12-18 ENCOUNTER — Ambulatory Visit (INDEPENDENT_AMBULATORY_CARE_PROVIDER_SITE_OTHER): Payer: Medicare Other | Admitting: Family Medicine

## 2020-12-18 VITALS — BP 140/68 | HR 58 | Temp 97.6°F | Ht 71.0 in | Wt 154.4 lb

## 2020-12-18 DIAGNOSIS — E1122 Type 2 diabetes mellitus with diabetic chronic kidney disease: Secondary | ICD-10-CM

## 2020-12-18 DIAGNOSIS — C9110 Chronic lymphocytic leukemia of B-cell type not having achieved remission: Secondary | ICD-10-CM

## 2020-12-18 DIAGNOSIS — I2510 Atherosclerotic heart disease of native coronary artery without angina pectoris: Secondary | ICD-10-CM

## 2020-12-18 DIAGNOSIS — Z23 Encounter for immunization: Secondary | ICD-10-CM

## 2020-12-18 DIAGNOSIS — I10 Essential (primary) hypertension: Secondary | ICD-10-CM | POA: Diagnosis not present

## 2020-12-18 DIAGNOSIS — E1142 Type 2 diabetes mellitus with diabetic polyneuropathy: Secondary | ICD-10-CM | POA: Diagnosis not present

## 2020-12-18 DIAGNOSIS — I6523 Occlusion and stenosis of bilateral carotid arteries: Secondary | ICD-10-CM | POA: Diagnosis not present

## 2020-12-18 DIAGNOSIS — Z794 Long term (current) use of insulin: Secondary | ICD-10-CM | POA: Diagnosis not present

## 2020-12-18 DIAGNOSIS — K8689 Other specified diseases of pancreas: Secondary | ICD-10-CM

## 2020-12-18 DIAGNOSIS — N183 Chronic kidney disease, stage 3 unspecified: Secondary | ICD-10-CM

## 2020-12-18 DIAGNOSIS — K8681 Exocrine pancreatic insufficiency: Secondary | ICD-10-CM | POA: Insufficient documentation

## 2020-12-18 LAB — POCT GLYCOSYLATED HEMOGLOBIN (HGB A1C): Hemoglobin A1C: 8 % — AB (ref 4.0–5.6)

## 2020-12-18 NOTE — Assessment & Plan Note (Signed)
Saw GI, started on creon with benefit.  Discussed how this contributes to insulin dependent diabetes.

## 2020-12-18 NOTE — Assessment & Plan Note (Signed)
Appreciate onc care.  

## 2020-12-18 NOTE — Assessment & Plan Note (Addendum)
Chronic, adequate. He notes AM readings elevated - will switch lisinopril 5mg  to am and amlodipine 5mg  to night time. If needed, low threshold to increase ACEI dose.

## 2020-12-18 NOTE — Assessment & Plan Note (Signed)
Chronic, brittle diabetes in history of pancreatic atrophy, adequate control.  CGM records reviewed. Night time sugars trend high however he is hesitant to increase novolog dose due to concern over hypoglycemia.  Continue current regimen, increase levemir to 14u nightly.

## 2020-12-18 NOTE — Progress Notes (Signed)
Patient ID: Scott [name], male    DOB: 04/04/1940, 80 y.o.   MRN: [id number]  This visit was conducted in person.  BP 140/68   Pulse (!) 58   Temp 97.6 F (36.4 C) (Temporal)   Ht 5' 11" (1.803 m)   Wt 154 lb 7 oz (70.1 kg)   SpO2 98%   BMI 21.54 kg/m    CC: 4 mo DM f/u visit  Subjective:   HPI: Scott [name] is a 80 y.o. male presenting on 12/18/2020 for Diabetes (Here for 4 mo f/u.  Also, c/o recent elevated BP readings every AM.)   CLL with 11q ATM gene mutation - followed by onc. S/p Rituxan + Treanda treatment 01/2019, in partial remission.   Postprandial abdominal pain for several months associated with weight loss in known h/o diffuse pancreatic atrophy on CT 08/2017, recently saw GI ([facility]) - trial creon with benefit. Working on patient assistance program for this medication. Continues omeprazole 88m daily. Fecal pancreatic elastase, pending results.   HTN - Compliant with current antihypertensive regimen of amlodipine 526mdaily, carvedilol 6.2575mid, lisinopril 5mg80mily. Does check blood pressures at home: every morning 150 528tolic.  No low blood pressure readings or symptoms of dizziness/syncope.  Denies HA, vision changes, CP/tightness, SOB, leg swelling.    DM - does regularly check sugars with CGM - today 131-198. Compliant with antihyperglycemic regimen which includes: novolog 3-4u TID AC, levemir 13u QHS. Denies low sugars or hypoglycemic symptoms. Denies paresthesias, blurry vision. Last diabetic eye exam 03/2020. Glucometer brand: Free[employer]gate-Palmol[employer]est foot exam: 01/2020. DSME: completed s/p refresher courses latest 03/2018. Lab Results  Component Value Date   HGBA1C 8.0 (A) 12/18/2020   Diabetic Foot Exam - Simple   Simple Foot Form Diabetic Foot exam was performed with the following findings: Yes 12/18/2020 10:57 AM  Visual Inspection See comments: Yes Sensation Testing Intact to touch and monofilament testing bilaterally: Yes Pulse Check Posterior  Tibialis and Dorsalis pulse intact bilaterally: Yes Comments Dry skin to soles Thickened nails    Lab Results  Component Value Date   MICROALBUR 29.0 (H) 02/22/2019         Relevant past medical, surgical, family and social history reviewed and updated as indicated. Interim medical history since our last visit reviewed. Allergies and medications reviewed and updated. Outpatient Medications Prior to Visit  Medication Sig Dispense Refill   acetaminophen (TYLENOL) 650 MG CR tablet Take 1,300 mg by mouth 2 (two) times daily.     amLODipine (NORVASC) 5 MG tablet Take 1 tablet (5 mg total) by mouth daily. 90 tablet 3   atorvastatin (LIPITOR) 80 MG tablet Take 1 tablet (80 mg total) by mouth every Monday, Wednesday, and Friday. 39 tablet 3   carvedilol (COREG) 6.25 MG tablet Take 1 tablet (6.25 mg total) by mouth 2 (two) times daily with a meal. 180 tablet 3   Continuous Blood Gluc Receiver (FREESTYLE LIBRE 14 DAY READER) DEVI Use as directed to check sugars QID AC HS 2 each 1   Continuous Blood Gluc Sensor (FREESTYLE LIBRE 14 DAY SENSOR) MISC Use as directed to check sugars QID HS 2 each 11   ezetimibe (ZETIA) 10 MG tablet Take 1 tablet (10 mg total) by mouth daily. 90 tablet 3   levothyroxine (SYNTHROID) 88 MCG tablet Take 1 tablet (88 mcg total) by mouth daily. 90 tablet 3   lipase/protease/amylase (CREON) 36000 UNITS CPEP capsule Take 2 capsules with the first bite of each  meal and 1 capsule with the first bite of each snack 240 capsule 2   lisinopril (ZESTRIL) 5 MG tablet Take 1 tablet (5 mg total) by mouth at bedtime. 90 tablet 3   nitroGLYCERIN (NITROSTAT) 0.4 MG SL tablet Place 1 tablet (0.4 mg total) under the tongue every 5 (five) minutes as needed for chest pain ((MAX of 3 doses)). 25 tablet 0   NOVOLOG FLEXPEN 100 UNIT/ML FlexPen Inject 3-4 Units into the skin 3 (three) times daily with meals. 15 mL 1   oxymetazoline (AFRIN) 0.05 % nasal spray Place 1 spray into left nostril daily  as needed (nose bleeds).     Pancrelipase, Lip-Prot-Amyl, (ZENPEP) 40000-126000 units CPEP Take 2 capsules with the first bite of each meal and 1 capsule with the first bite of each snack 240 capsule 3   traZODone (DESYREL) 100 MG tablet TAKE 1 TABLET BY MOUTH AT BEDTIME AS NEEDED FOR SLEEP 90 tablet 0   triamcinolone ointment (KENALOG) 0.5 % Apply 1 application topically 2 (two) times daily. 30 g 1   insulin detemir (LEVEMIR FLEXTOUCH) 100 UNIT/ML FlexPen Inject 13 Units into the skin daily. 15 mL    insulin detemir (LEVEMIR FLEXTOUCH) 100 UNIT/ML FlexPen Inject 14 Units into the skin daily. 1 mL    Facility-Administered Medications Prior to Visit  Medication Dose Route Frequency Provider Last Rate Last Admin   sodium chloride flush (NS) 0.9 % injection 10 mL  10 mL Intravenous Once Lloyd Huger, MD         Per HPI unless specifically indicated in ROS section below Review of Systems  Objective:  BP 140/68   Pulse (!) 58   Temp 97.6 F (36.4 C) (Temporal)   Ht 5' 11" (1.803 m)   Wt 154 lb 7 oz (70.1 kg)   SpO2 98%   BMI 21.54 kg/m   Wt Readings from Last 3 Encounters:  12/18/20 154 lb 7 oz (70.1 kg)  12/07/20 152 lb 3.2 oz (69 kg)  11/17/20 152 lb (68.9 kg)      Physical Exam Vitals and nursing note reviewed.  Constitutional:      Appearance: Normal appearance. He is not ill-appearing.  Eyes:     Extraocular Movements: Extraocular movements intact.     Conjunctiva/sclera: Conjunctivae normal.     Pupils: Pupils are equal, round, and reactive to light.  Cardiovascular:     Rate and Rhythm: Normal rate and regular rhythm.     Pulses: Normal pulses.     Heart sounds: Normal heart sounds. No murmur heard. Pulmonary:     Effort: Pulmonary effort is normal. No respiratory distress.     Breath sounds: Normal breath sounds. No wheezing, rhonchi or rales.  Musculoskeletal:     Right lower leg: No edema.     Left lower leg: No edema.     Comments: See HPI for foot exam  if done  Skin:    General: Skin is warm and dry.     Findings: No rash.  Neurological:     Mental Status: He is alert.  Psychiatric:        Mood and Affect: Mood normal.        Behavior: Behavior normal.      Results for orders placed or performed in visit on 12/18/20  POCT glycosylated hemoglobin (Hb A1C)  Result Value Ref Range   Hemoglobin A1C 8.0 (A) 4.0 - 5.6 %   HbA1c POC (<> result, manual entry)  HbA1c, POC (prediabetic range)     HbA1c, POC (controlled diabetic range)      Assessment & Plan:  This visit occurred during the SARS-CoV-2 public health emergency.  Safety protocols were in place, including screening questions prior to the visit, additional usage of staff PPE, and extensive cleaning of exam room while observing appropriate contact time as indicated for disinfecting solutions.   Problem List Items Addressed This Visit     CAD (coronary artery disease)    Established with Dr Rockey Situ, started on zetia 08/2020      Hypertension    Chronic, adequate. He notes AM readings elevated - will switch lisinopril 41m to am and amlodipine 590mto night time. If needed, low threshold to increase ACEI dose.       Controlled diabetes mellitus with stage 3 chronic kidney disease, with long-term current use of insulin (HCC) - Primary    Chronic, brittle diabetes in history of pancreatic atrophy, adequate control.  CGM records reviewed. Night time sugars trend high however he is hesitant to increase novolog dose due to concern over hypoglycemia.  Continue current regimen, increase levemir to 14u nightly.       Relevant Medications   insulin detemir (LEVEMIR FLEXTOUCH) 100 UNIT/ML FlexPen   Other Relevant Orders   POCT glycosylated hemoglobin (Hb A1C) (Completed)   Diabetic neuropathy (HCC)    Some burning discomfort to feet with prolonged shoe wear. Consider diabetic shoes.       Relevant Medications   insulin detemir (LEVEMIR FLEXTOUCH) 100 UNIT/ML FlexPen   CLL  (chronic lymphocytic [place]) [place]   Appreciate onc care.      Atrophic pancreas    Saw GI, started on creon with benefit.  Discussed how this contributes to insulin dependent diabetes.      Other Visit Diagnoses     Need for influenza vaccination       Relevant Orders   Flu Vaccine QUAD High Dose(Fluad) (Completed)        No orders of the defined types were placed in this encounter.  Orders Placed This Encounter  Procedures   Flu Vaccine QUAD High Dose(Fluad)   POCT glycosylated hemoglobin (Hb A1C)     Patient Instructions  Flu shot today  Foot exam today Take lisinopril 10m28mn the morning and amlodipine 10mg96m the evening. Let me know if blood pressure stays too high.  Increase levemir to 14 units nightly.  Return in 4 months for medicare wellness visit   Follow up plan: Return in about 4 months (around 04/20/2021) for medicare wellness visit.  Scott [name]

## 2020-12-18 NOTE — Assessment & Plan Note (Signed)
Established with Dr Rockey Situ, started on zetia 08/2020

## 2020-12-18 NOTE — Assessment & Plan Note (Signed)
Some burning discomfort to feet with prolonged shoe wear. Consider diabetic shoes.

## 2020-12-18 NOTE — Patient Instructions (Addendum)
Flu shot today  Foot exam today Take lisinopril 5mg  in the morning and amlodipine 5mg  in the evening. Let me know if blood pressure stays too high.  Increase levemir to 14 units nightly.  Return in 4 months for medicare wellness visit

## 2020-12-20 LAB — PANCREATIC ELASTASE, FECAL: Pancreatic Elastase, Fecal: <50 ug Elast./g — ABNORMAL LOW (ref >200)

## 2020-12-21 ENCOUNTER — Telehealth: Payer: Self-pay

## 2020-12-21 NOTE — Telephone Encounter (Signed)
-----   Message from Lin Landsman, MD sent at 12/20/2020  5:15 PM EDT ----- Patient's pancreatic fecal elastase levels confirm that he has exocrine pancreatic insufficiency and he will need Creon long-term.  I think he is in the process of applying for patient assistance program, please check with him. His EPI is secondary to diffuse pancreatic atrophy and fatty pancreas  Thanks RV

## 2020-12-21 NOTE — Telephone Encounter (Signed)
Called patient states  he already faxed in paperwork for assistance he cannot get the medicine needed until he gets help and we do not have any samples of creon in office

## 2020-12-22 ENCOUNTER — Encounter: Payer: Self-pay | Admitting: Family Medicine

## 2021-01-01 NOTE — Telephone Encounter (Signed)
[facility] called requesting a call back about patients medication refill. She says the form was signed with the wrong quantity and directions

## 2021-01-02 ENCOUNTER — Telehealth: Payer: Self-pay

## 2021-01-02 ENCOUNTER — Telehealth: Payer: Self-pay | Admitting: Gastroenterology

## 2021-01-02 NOTE — Telephone Encounter (Signed)
Pt. Calling to check on status of creon and the patient assistance. His wife is requesting a call back

## 2021-01-02 NOTE — Telephone Encounter (Signed)
Called patient back to let him know I did get everything faxed over for his creon medicine

## 2021-01-06 ENCOUNTER — Other Ambulatory Visit: Payer: Self-pay | Admitting: Family Medicine

## 2021-01-08 ENCOUNTER — Telehealth: Payer: Self-pay

## 2021-01-08 NOTE — Telephone Encounter (Signed)
Called patient after I called [facility] they are mailing his medicine in 7-10 buisness days put samples up front for him to pick up to last him till he gets his in mail

## 2021-01-10 ENCOUNTER — Telehealth: Payer: Self-pay

## 2021-01-10 NOTE — Telephone Encounter (Signed)
Called patient again to remind him we have samples up front for him till he gets his medicine in mail

## 2021-01-11 ENCOUNTER — Other Ambulatory Visit: Payer: Self-pay | Admitting: Family Medicine

## 2021-01-11 DIAGNOSIS — I10 Essential (primary) hypertension: Secondary | ICD-10-CM

## 2021-02-15 ENCOUNTER — Other Ambulatory Visit: Payer: Self-pay

## 2021-02-15 ENCOUNTER — Inpatient Hospital Stay: Payer: Medicare Other | Attending: Oncology

## 2021-02-15 DIAGNOSIS — D649 Anemia, unspecified: Secondary | ICD-10-CM

## 2021-02-15 DIAGNOSIS — C9110 Chronic lymphocytic leukemia of B-cell type not having achieved remission: Secondary | ICD-10-CM | POA: Diagnosis not present

## 2021-02-15 LAB — CBC WITH DIFFERENTIAL/PLATELET
Abs Immature Granulocytes: 0.01 10*3/uL (ref 0.00–0.07)
Basophils Absolute: 0 10*3/uL (ref 0.0–0.1)
Basophils Relative: 1 %
Eosinophils Absolute: 0.1 10*3/uL (ref 0.0–0.5)
Eosinophils Relative: 2 %
HCT: 35.7 % — ABNORMAL LOW (ref 39.0–52.0)
Hemoglobin: 11.3 g/dL — ABNORMAL LOW (ref 13.0–17.0)
Immature Granulocytes: 0 %
Lymphocytes Relative: 22 %
Lymphs Abs: 0.9 10*3/uL (ref 0.7–4.0)
MCH: 29.9 pg (ref 26.0–34.0)
MCHC: 31.7 g/dL (ref 30.0–36.0)
MCV: 94.4 fL (ref 80.0–100.0)
Monocytes Absolute: 0.3 10*3/uL (ref 0.1–1.0)
Monocytes Relative: 8 %
Neutro Abs: 2.7 10*3/uL (ref 1.7–7.7)
Neutrophils Relative %: 67 %
Platelets: 109 10*3/uL — ABNORMAL LOW (ref 150–400)
RBC: 3.78 MIL/uL — ABNORMAL LOW (ref 4.22–5.81)
RDW: 12.5 % (ref 11.5–15.5)
WBC: 3.9 10*3/uL — ABNORMAL LOW (ref 4.0–10.5)
nRBC: 0 % (ref 0.0–0.2)

## 2021-02-15 LAB — COMPREHENSIVE METABOLIC PANEL
ALT: 48 U/L — ABNORMAL HIGH (ref 0–44)
AST: 39 U/L (ref 15–41)
Albumin: 4.2 g/dL (ref 3.5–5.0)
Alkaline Phosphatase: 82 U/L (ref 38–126)
Anion gap: 10 (ref 5–15)
BUN: 45 mg/dL — ABNORMAL HIGH (ref 8–23)
CO2: 26 mmol/L (ref 22–32)
Calcium: 9.9 mg/dL (ref 8.9–10.3)
Chloride: 105 mmol/L (ref 98–111)
Creatinine, Ser: 1.51 mg/dL — ABNORMAL HIGH (ref 0.61–1.24)
GFR, Estimated: 46 mL/min — ABNORMAL LOW (ref >60)
Glucose, Bld: 168 mg/dL — ABNORMAL HIGH (ref 70–99)
Potassium: 4.9 mmol/L (ref 3.5–5.1)
Sodium: 141 mmol/L (ref 135–145)
Total Bilirubin: 0.6 mg/dL (ref 0.3–1.2)
Total Protein: 6.7 g/dL (ref 6.5–8.1)

## 2021-02-15 LAB — SAMPLE TO BLOOD BANK

## 2021-02-28 ENCOUNTER — Telehealth: Payer: Self-pay

## 2021-02-28 DIAGNOSIS — E039 Hypothyroidism, unspecified: Secondary | ICD-10-CM

## 2021-02-28 NOTE — Telephone Encounter (Signed)
Received faxed message from [facility] stating previous levothyroxine manufacturer is no longer available.  They are requesting permission to change to new manufacturer.

## 2021-02-28 NOTE — Telephone Encounter (Signed)
Ok to change formulary if necessary.  Plz notify patient and have him schedule lab visit in 6 wks after starting new formulation to recheck thyroid function.  I have ordered labs.

## 2021-02-28 NOTE — Addendum Note (Signed)
Addended by: [name] on: 02/28/2021 05:18 PM   Modules accepted: Orders

## 2021-03-01 NOTE — Telephone Encounter (Signed)
Left message for pt per DPR to call the office to schedule non-fasting lab in 6 weeks. Called pharmacy to authorize change and to put the lab note on the rx.

## 2021-03-02 ENCOUNTER — Telehealth: Payer: Self-pay | Admitting: Family Medicine

## 2021-03-02 ENCOUNTER — Other Ambulatory Visit: Payer: Self-pay | Admitting: Family Medicine

## 2021-03-02 MED ORDER — LISINOPRIL 5 MG PO TABS
5.0000 mg | ORAL_TABLET | Freq: Every evening | ORAL | 0 refills | Status: DC
Start: 2021-03-02 — End: 2021-05-11

## 2021-03-02 MED ORDER — NOVOLOG FLEXPEN 100 UNIT/ML ~~LOC~~ SOPN
3.0000 [IU] | PEN_INJECTOR | Freq: Three times a day (TID) | SUBCUTANEOUS | 0 refills | Status: DC
Start: 2021-03-02 — End: 2021-05-31

## 2021-03-02 MED ORDER — LEVEMIR FLEXTOUCH 100 UNIT/ML ~~LOC~~ SOPN: 14.0000 [IU] | PEN_INJECTOR | Freq: Every day | SUBCUTANEOUS | 0 refills | Status: DC

## 2021-03-02 NOTE — Telephone Encounter (Signed)
E-scribed lisinopril, Novolog Flexpen and Levemir.    Zetia rx was sent to [facility] on 06/06/20, # 16/3 by Dr. Rockey Situ.  Pt should have enough refills thru 06/2021.  Spoke with pt relaying messages above.  Pt verbalizes understanding and expresses his thanks.

## 2021-03-02 NOTE — Telephone Encounter (Signed)
°  Encourage patient to contact the pharmacy for refills or they can request refills through [facility]:  Please schedule appointment if longer than 1 year  NEXT APPOINTMENT DATE:03/12/2021  MEDICATION:lisinopril (ZESTRIL) 5 MG tablet,traZODone (DESYREL) 100 MG tablet,NOVOLOG FLEXPEN 100 UNIT/ML FlexPen,ezetimibe (ZETIA) 10 MG tablet,atorvastatin (LIPITOR) 80 MG tablet,insulin detemir (LEVEMIR FLEXTOUCH) 100 UNIT/ML FlexPen  Is the patient out of medication?   [place], [place] - [place]  Let patient know to contact pharmacy at the end of the day to make sure medication is ready.  Please notify patient to allow 48-72 hours to process  CLINICAL FILLS OUT ALL BELOW:   LAST REFILL:  QTY:  REFILL DATE:    OTHER COMMENTS:    Okay for refill?  Please advise

## 2021-03-02 NOTE — Addendum Note (Signed)
Addended by: Brenton Grills on: 28/41/3244 03:37 PM   Modules accepted: Orders

## 2021-03-05 ENCOUNTER — Encounter: Payer: Self-pay | Admitting: Oncology

## 2021-03-06 DIAGNOSIS — E119 Type 2 diabetes mellitus without complications: Secondary | ICD-10-CM | POA: Diagnosis not present

## 2021-03-06 DIAGNOSIS — H04123 Dry eye syndrome of bilateral lacrimal glands: Secondary | ICD-10-CM | POA: Diagnosis not present

## 2021-03-06 DIAGNOSIS — Z961 Presence of intraocular lens: Secondary | ICD-10-CM | POA: Diagnosis not present

## 2021-03-06 DIAGNOSIS — H401132 Primary open-angle glaucoma, bilateral, moderate stage: Secondary | ICD-10-CM | POA: Diagnosis not present

## 2021-03-06 DIAGNOSIS — H43822 Vitreomacular adhesion, left eye: Secondary | ICD-10-CM | POA: Diagnosis not present

## 2021-03-06 DIAGNOSIS — H02831 Dermatochalasis of right upper eyelid: Secondary | ICD-10-CM | POA: Diagnosis not present

## 2021-03-06 DIAGNOSIS — H02834 Dermatochalasis of left upper eyelid: Secondary | ICD-10-CM | POA: Diagnosis not present

## 2021-03-06 LAB — HM DIABETES EYE EXAM

## 2021-03-12 ENCOUNTER — Encounter: Payer: Self-pay | Admitting: Family Medicine

## 2021-03-12 ENCOUNTER — Ambulatory Visit: Payer: Medicare Other | Admitting: Gastroenterology

## 2021-03-12 ENCOUNTER — Telehealth: Payer: Self-pay

## 2021-03-12 NOTE — Telephone Encounter (Signed)
Received fax from [facility] stating PA is needed for Freestyle libre sensor 14 day kit. Tried covermymeds but received feedback to call [phone]. ° °Left message for patient to call back. IN 2019/2020 their is a note where patient agreed to pay out of pocket because Medicare would not cover and then there was DME store that was helping deliver this to patient. Need to confirm if patient is paying cash for this or need to proceed with PA °

## 2021-03-13 ENCOUNTER — Telehealth (INDEPENDENT_AMBULATORY_CARE_PROVIDER_SITE_OTHER): Payer: Medicare HMO | Admitting: Family

## 2021-03-13 VITALS — Temp 96.5°F | Wt 155.0 lb

## 2021-03-13 DIAGNOSIS — J40 Bronchitis, not specified as acute or chronic: Secondary | ICD-10-CM | POA: Diagnosis not present

## 2021-03-13 MED ORDER — AZITHROMYCIN 250 MG PO TABS: ORAL_TABLET | ORAL | 0 refills | Status: AC

## 2021-03-13 NOTE — Assessment & Plan Note (Signed)
zpack prescribed and sent to pharmacy, hesistant for augmentin due to sensitive stomach. Has tolerated zpack in the past. Take antibiotic as prescribed. Increase oral fluids. Pt to f/u if sx worsen and or fail to improve in 2-3 days.

## 2021-03-13 NOTE — Telephone Encounter (Signed)
Please proceed with prior [name] pharmacy told the patient that if medical necessity was determined, they could either get the [place] at a reduced rate, or free

## 2021-03-13 NOTE — Progress Notes (Signed)
Virtual telephone visit    Virtual Visit via Telephone Note   This visit type was conducted due to national recommendations for restrictions regarding the COVID-19 Pandemic (e.g. social distancing) in an effort to limit this patient's exposure and mitigate transmission in our community. Due to his co-morbid illnesses, this patient is at least at moderate risk for complications without adequate follow up. This format is felt to be most appropriate for this patient at this time. The patient did not have access to video technology or had technical difficulties with video requiring transitioning to audio format only (telephone). Physical exam was limited to content and character of the telephone converstion. CMA was able to get the patient set up on a telephone visit.   Patient location: Home. Patient and provider in visit Provider location: Office  I discussed the limitations of evaluation and management by telemedicine and the availability of in person appointments. The patient expressed understanding and agreed to proceed.   Visit Date: 03/13/2021  Today's healthcare provider: Eugenia Pancoast, FNP     Subjective:    Patient ID: Scott [name], male    DOB: 1940-11-08, 81 y.o.   MRN: [id number]  Chief Complaint  Patient presents with   Cough    Dry, started last Thursday Covid tested negative this morning.   Sore Throat    Cough Associated symptoms include a sore throat. Pertinent negatives include no chest pain, chills, ear pain, fever, shortness of breath or wheezing.  Sore Throat  Associated symptoms include coughing (dry non productive). Pertinent negatives include no congestion, ear pain or shortness of breath.   81 y/o here with concerns via telephone encounter, unable to figure out the video option.   Dry cough started six days ago. Does have chest congestion. Denies sob. Does have a slight sore throat, did lose voice a bit last week as well. No ear pain. Denies nasal  congestion and or sinus pressure. Has not had a fever. Last T a few minutes was 96.5 F.   Tested covid negative this am.  Has been taking otc mucinex without much relief.   Past Medical History:  Diagnosis Date   Acquired hand deformity 1962   hand saw accident at work   Anemia 10/11/2011   Arthritis    Bradycardia 10/10/2011   CAD (coronary artery disease) 10/10/2011   MI s/p PTCA (Dx-OM2 proximal concentric stenosis)   Carotid artery disease (HCC)    Cellulitis of buttock, left 11/17/2018   Chronic edema    CKD (chronic kidney disease) stage 3, GFR 30-59 ml/min (HCC)    Scott [name]   CLL (chronic lymphocytic leukemia) ([place])    Colon polyps    Cough 04/28/2019   Dysphagia    EGD - reflux esophagitis, one area of stomach with focal intestinal metaplasia (06/2019) Dr Marius Ditch   Generalized headaches    frequent   GI bleed 07/08/2017   Glaucoma    s/p laser surgery   HLD (hyperlipidemia)    Hyperkalemia 02/28/2018   Hypertension    Hypothyroidism 10/10/2011   Iron deficiency anemia due to chronic blood loss    Squamous cell carcinoma of skin 06/15/2019   Right posterior ear. SCCis, hypertrophic, crusted   Thrombocytopenia ([place]) 10/11/2011   Type 2 diabetes with nephropathy [facility])    DM refresher course [place] (04/2013)    Past Surgical History:  Procedure Laterality Date   BACK SURGERY     cervical neck   CATARACT EXTRACTION, BILATERAL Bilateral 2017  COLONOSCOPY  11/2008   1 polyp, diverticulosis, rec rpt 5 yrs (Dr. Oletta Lamas, [name])   COLONOSCOPY  06/2014   hyperplastic polyp, rpt 5 yrs Oletta Lamas)   COLONOSCOPY WITH PROPOFOL N/A 11/17/2017   TA, HP, (Vanga, Tally Due, MD)   ESOPHAGOGASTRODUODENOSCOPY (EGD) WITH PROPOFOL N/A 11/17/2017   healing erosive gastritis, intestinal metaplasia, neg H pylori (Vanga, Tally Due, MD)   ESOPHAGOGASTRODUODENOSCOPY (EGD) WITH PROPOFOL N/A 06/28/2019   erosive gastropathy with stigmata of recent bleed, granular tissue in esophagus biopsied- reflux  esophagitis, small HH (Vanga, Tally Due, MD)   EYE SURGERY  2012   laser surgery for glaucoma   PORTACATH PLACEMENT Left 12/09/2018   Procedure: INSERTION PORT-A-CATH;  Surgeon: [name], MD;  Location: [facility] ORS;  Service: General;  Laterality: Left;   PTCA  1994, 1995   US ECHOCARDIOGRAPHY  10/2013   inferior wall hypokinesis, mild LVH, EF 50-55%, mild MR and LA dilation    Family History  Problem Relation Age of Onset   Diabetes Sister    Stomach cancer Sister 73   Pneumonia Father        caused death   Other Brother        no communication with brother so unsure of any health conditions   Coronary artery disease Son 22       5v CABG and stents   Hyperlipidemia Sister    Stroke Neg Hx    Heart attack Neg Hx     Social History   Socioeconomic History   Marital status: Widowed    Spouse name: Not on file   Number of children: Not on file   Years of education: Not on file   Highest education level: Not on file  Occupational History   Not on file  Tobacco Use   Smoking status: Former    Types: Cigarettes    Quit date: 03/04/1978    Years since quitting: 43.0   Smokeless tobacco: Never  Vaping Use   Vaping Use: Never used  Substance and Sexual Activity   Alcohol use: No   Drug use: No   Sexual activity: Never  Other Topics Concern   Not on file  Social History Narrative   Widower. Wife passed 08/2015 from cancer. 1 dog   Occupation: retired, was route Hotel manager   Edu: 11th gr   Activity: walks dog (pomeranian) 3-4x/day, yardwork, rides bicycle.   Diet: drinks diet coke, no vegetables   Social Determinants of Health   Financial Resource Strain: Low Risk    Difficulty of Paying Living Expenses: Not hard at all  Food Insecurity: No Food Insecurity   Worried About Charity fundraiser in the Last Year: Never true   Ran Out of Food in the Last Year: Never true  Transportation Needs: No Transportation Needs   Lack of Transportation (Medical): No   Lack of  Transportation (Non-Medical): No  Physical Activity: Inactive   Days of Exercise per Week: 0 days   Minutes of Exercise per Session: 0 min  Stress: No Stress Concern Present   Feeling of Stress : Not at all  Social Connections: Not on file  Intimate Partner Violence: Not At Risk   Fear of Current or Ex-Partner: No   Emotionally Abused: No   Physically Abused: No   Sexually Abused: No    Outpatient Medications Prior to Visit  Medication Sig Dispense Refill   acetaminophen (TYLENOL) 650 MG CR tablet Take 1,300 mg by mouth 2 (two) times daily.  amLODipine (NORVASC) 5 MG tablet Take 1 tablet by mouth once daily 90 tablet 0   atorvastatin (LIPITOR) 80 MG tablet Take 1 tablet by mouth once daily 39 tablet 0   carvedilol (COREG) 6.25 MG tablet TAKE 1 TABLET BY MOUTH TWICE DAILY WITH MEALS 180 tablet 0   Continuous Blood Gluc Receiver (FREESTYLE LIBRE 14 DAY READER) DEVI Use as directed to check sugars QID AC HS 2 each 1   Continuous Blood Gluc Sensor (FREESTYLE LIBRE 14 DAY SENSOR) MISC Use as directed to check sugars QID HS 2 each 11   ezetimibe (ZETIA) 10 MG tablet Take 1 tablet (10 mg total) by mouth daily. 90 tablet 3   insulin detemir (LEVEMIR FLEXTOUCH) 100 UNIT/ML FlexPen Inject 14 Units into the skin daily. 1 mL 0   levothyroxine (SYNTHROID) 88 MCG tablet Take 1 tablet (88 mcg total) by mouth daily. 90 tablet 3   lipase/protease/amylase (CREON) 36000 UNITS CPEP capsule Take 2 capsules with the first bite of each meal and 1 capsule with the first bite of each snack 240 capsule 2   lisinopril (ZESTRIL) 5 MG tablet Take 1 tablet (5 mg total) by mouth at bedtime. 90 tablet 0   nitroGLYCERIN (NITROSTAT) 0.4 MG SL tablet Place 1 tablet (0.4 mg total) under the tongue every 5 (five) minutes as needed for chest pain ((MAX of 3 doses)). 25 tablet 0   NOVOLOG FLEXPEN 100 UNIT/ML FlexPen Inject 3-4 Units into the skin 3 (three) times daily with meals. 15 mL 0   oxymetazoline (AFRIN) 0.05 %  nasal spray Place 1 spray into left nostril daily as needed (nose bleeds).     traZODone (DESYREL) 100 MG tablet TAKE 1 TABLET BY MOUTH AT BEDTIME AS NEEDED FOR SLEEP 90 tablet 0   triamcinolone ointment (KENALOG) 0.5 % Apply 1 application topically 2 (two) times daily. 30 g 1   Pancrelipase, Lip-Prot-Amyl, (ZENPEP) 40000-126000 units CPEP Take 2 capsules with the first bite of each meal and 1 capsule with the first bite of each snack 240 capsule 3   Facility-Administered Medications Prior to Visit  Medication Dose Route Frequency Provider Last Rate Last Admin   sodium chloride flush (NS) 0.9 % injection 10 mL  10 mL Intravenous Once Lloyd Huger, MD        No Known Allergies  Review of Systems  Constitutional:  Negative for chills and fever.  HENT:  Positive for sore throat. Negative for congestion and ear pain.   Respiratory:  Positive for cough (dry non productive). Negative for sputum production, shortness of breath and wheezing.   Cardiovascular:  Negative for chest pain.  All other systems reviewed and are negative.     Objective:    Physical Exam Neurological:     General: No focal deficit present.     Mental Status: He is alert and oriented to person, place, and time.  Psychiatric:        Mood and Affect: Mood normal.        Behavior: Behavior normal.    Temp (!) 96.5 F (35.8 C) (Temporal)    Wt 155 lb (70.3 kg)    BMI 21.62 kg/m  Wt Readings from Last 3 Encounters:  03/13/21 155 lb (70.3 kg)  12/18/20 154 lb 7 oz (70.1 kg)  12/07/20 152 lb 3.2 oz (69 kg)        Assessment & Plan:   Problem List Items Addressed This Visit       Respiratory  Bronchitis - Primary    zpack prescribed and sent to pharmacy, hesistant for augmentin due to sensitive stomach. Has tolerated zpack in the past. Take antibiotic as prescribed. Increase oral fluids. Pt to f/u if sx worsen and or fail to improve in 2-3 days.       Relevant Medications   azithromycin  (ZITHROMAX) 250 MG tablet    I have discontinued Scott [name]'s Zenpep. I am also having him start on azithromycin. Additionally, I am having him maintain his acetaminophen, nitroGLYCERIN, oxymetazoline, levothyroxine, ezetimibe, triamcinolone ointment, FreeStyle Libre 14 Day Reader, FreeStyle Libre 14 Day Sensor, lipase/protease/amylase, amLODipine, carvedilol, atorvastatin, traZODone, lisinopril, NovoLOG FlexPen, and Levemir FlexTouch.  Meds ordered this encounter  Medications   azithromycin (ZITHROMAX) 250 MG tablet    Sig: Take 2 tablets on day 1, then 1 tablet daily on days 2 through 5    Dispense:  6 tablet    Refill:  0    Order Specific Question:   Supervising Provider    Answer:   BEDSOLE, AMY E [[id number]]     I discussed the assessment and treatment plan with the patient. The patient was provided an opportunity to ask questions and all were answered. The patient agreed with the plan and demonstrated an understanding of the instructions.   The patient was advised to call back or seek an in-person evaluation if the symptoms worsen or if the condition fails to improve as anticipated.  I provided 16 minutes of non-face-to-face time during this encounter.   Eugenia Pancoast, [facility] [phone] (phone) [phone] (fax)  [place]

## 2021-03-15 ENCOUNTER — Telehealth: Payer: Medicare HMO | Admitting: Family

## 2021-03-19 NOTE — Telephone Encounter (Signed)
Spoke with [place] to get the following info to submit PA:  BIN: [id number] PCN: [id number] RxGrp: [id number] ID: [id number]  Submitted PA started by [employer] via CoverMyMeds.  Key: [id number].  Decision pending.

## 2021-04-03 ENCOUNTER — Ambulatory Visit (INDEPENDENT_AMBULATORY_CARE_PROVIDER_SITE_OTHER): Payer: Medicare HMO | Admitting: Gastroenterology

## 2021-04-03 ENCOUNTER — Other Ambulatory Visit: Payer: Self-pay

## 2021-04-03 ENCOUNTER — Encounter: Payer: Self-pay | Admitting: Gastroenterology

## 2021-04-03 VITALS — BP 118/62 | HR 50 | Temp 97.8°F | Ht 71.0 in | Wt 148.5 lb

## 2021-04-03 DIAGNOSIS — R634 Abnormal weight loss: Secondary | ICD-10-CM

## 2021-04-03 DIAGNOSIS — K8681 Exocrine pancreatic insufficiency: Secondary | ICD-10-CM | POA: Diagnosis not present

## 2021-04-04 NOTE — Progress Notes (Signed)
[name], MD [address]  [place], [place] [address]  Main: [phone]  Fax: [phone]    Gastroenterology Consultation  Referring Provider:     [name], MD Primary Care Physician:  [name], MD Primary Gastroenterologist:  Dr. [name] Reason for Consultation:   Follow-up of EPI, weight loss        HPI:   Scott [name] is a 81 y.o. male referred by Dr. [name], MD  for consultation & management of exocrine pancreatic insufficiency, dysphagia  Follow-up visit 12/07/2020 Patient is here to discuss about central abdominal pain that has been ongoing for few months.  The pain is predominantly postprandial, last for about 2 hours.  He lost about 10 pounds since last year.  Patient does have history of diffuse pancreatic atrophy with partial fatty replacement based on CT in 08/2017.  Patient denies any loose stools or abdominal bloating.  His CBC, CMP have been stable.  Patient has not been taking PPI.  Patient denies any heartburn, epigastric pain  Follow-up visit 04/04/2021 Since last visit, patient is diagnosed with exocrine pancreatic insufficiency and have started him on Creon 36K 2 capsules with each meal and 1 with snack.  His pancreatic fecal elastase levels were less than 50.  Patient reports that he is no longer experiencing postprandial bloating, diarrhea or central abdominal pain.  Patient also reports that he is no longer experiencing difficulty swallowing.  He finished the course of omeprazole.  Despite being asymptomatic, patient lost about 5 to 6 pounds since last visit.  He states that his blood sugars have been running in 290s postprandial.  He is on insulin.  He has blood sugar log on his phone which she shared with me today.  Patient does report that he has only 2 meals a day.  NSAIDs:  none  Antiplts/Anticoagulants/Anti thrombotics: none  GI Procedures: underwent colonoscopy in 2010, subcentimeter polyps were  removed Underwent colonoscopy in 2016 in [place], 10 mm polyp from ascending colon was removed  EGD and colonoscopy 11/2017 - Multiple non-bleeding duodenal ulcers with a clean ulcer base ([place] Class III). - Non-bleeding erosive gastropathy. - Non-bleeding erosive gastropathy. Biopsied. - Small hiatal hernia. - Normal gastroesophageal junction and esophagus.  - Two 5 to 7 mm polyps in the transverse colon and in the cecum, removed with a hot snare. Resected and retrieved. - Diverticulosis in the sigmoid colon, in the transverse colon and in the ascending colon. - Increased mucosa vascular pattern in the rectum, prominent rectal veins - Normal mucosa in the entire examined colon.  DIAGNOSIS:   A. STOMACH, RANDOM; COLD BIOPSY:  - ANTRAL MUCOSA WITH REACTIVE AND HEALING EROSIVE GASTRITIS; FOCAL  INTESTINAL METAPLASIA IS PRESENT.  - OXYNTIC MUCOSA WITH MODERATE CHRONIC GASTRITIS AND CHANGES CONSISTENT  WITH PROTON PUMP INHIBITOR EFFECT.  - IHC FOR HELICOBACTER IS NEGATIVE.  - NEGATIVE FOR DYSPLASIA AND MALIGNANCY.   B.  COLON POLYP, CECUM; HOT SNARE:  - TUBULAR ADENOMA.  - NEGATIVE FOR HIGH-GRADE DYSPLASIA AND MALIGNANCY.   C.  COLON POLYP, TRANSVERSE; HOT SNARE:  - HYPERPLASTIC POLYP.  - NEGATIVE FOR DYSPLASIA AND MALIGNANCY.   Upper endoscopy 06/28/2019 - Normal duodenal bulb and second portion of the duodenum. - Erosive gastropathy with stigmata of recent bleeding. Biopsied. - Normal gastric body. Biopsied. - A medium amount of food (residue) in the stomach. - Small hiatal hernia. - Granular mucosa in the esophagus. Biopsied. - Normal esophagus.  DIAGNOSIS:  A.  STOMACH, INCISURA; COLD BIOPSY:  - ANTRAL-TYPE AND TRANSITIONAL-TYPE MUCOSA WITH CONGESTION AND MILD  REACTIVE FOVEOLAR HYPERPLASIA.  - NEGATIVE FOR H. PYLORI, INTESTINAL METAPLASIA, DYSPLASIA, AND  MALIGNANCY.   B.  STOMACH, ANTRUM GREATER CURVATURE; COLD BIOPSY:  - OXYNTIC AND ANTRAL MUCOSA WITH MILD  CHRONIC INFLAMMATION.  - NEGATIVE FOR H. PYLORI, INTESTINAL METAPLASIA, DYSPLASIA, AND  MALIGNANCY.   C.  STOMACH, ANTRUM LESSER CURVATURE; COLD BIOPSY:  - OXYNTIC MUCOSA WITH EDEMA.  - NEGATIVE FOR H. PYLORI, INTESTINAL METAPLASIA, DYSPLASIA, AND  MALIGNANCY.   D.  STOMACH, BODY GREATER CURVATURE; COLD BIOPSY:  - OXYNTIC MUCOSA WITH FOCAL INTESTINAL METAPLASIA INVOLVING 10% OF ONE  OF TWO FRAGMENTS, SEE COMMENT.  - NEGATIVE FOR H. PYLORI, DYSPLASIA, AND MALIGNANCY.   E.  STOMACH, BODY LESSER CURVATURE; COLD BIOPSY:  - OXYNTIC MUCOSA WITH EDEMA.  - NEGATIVE FOR H. PYLORI, INTESTINAL METAPLASIA, DYSPLASIA, AND  MALIGNANCY.   F.  GASTROESOPHAGEAL JUNCTION; COLD BIOPSY:  - SQUAMOUS-LINED MUCOSA WITH PARAKERATOSIS AND ACTIVE INFLAMMATION,  CONSISTENT WITH REFLUX ESOPHAGITIS.  - COLUMNAR-LINED MUCOSA WITH MILD CHRONIC INFLAMMATION AND REACTIVE  FOVEOLAR HYPERPLASIA.  - NEGATIVE FOR INTESTINAL METAPLASIA (GOBLET CELLS), DYSPLASIA, AND  MALIGNANCY.   Past Medical History:  Diagnosis Date   Acquired hand deformity 1962   hand saw accident at work   Anemia 10/11/2011   Arthritis    Bradycardia 10/10/2011   CAD (coronary artery disease) 10/10/2011   MI s/p PTCA (Dx-OM2 proximal concentric stenosis)   Carotid artery disease (HCC)    Cellulitis of buttock, left 11/17/2018   Chronic edema    CKD (chronic kidney disease) stage 3, GFR 30-59 ml/min (HCC)    [name]   CLL (chronic lymphocytic leukemia) ([place])    Colon polyps    Cough 04/28/2019   Dysphagia    EGD - reflux esophagitis, one area of stomach with focal intestinal metaplasia (06/2019) Dr Marius Ditch   Generalized headaches    frequent   GI bleed 07/08/2017   Glaucoma    s/p laser surgery   HLD (hyperlipidemia)    Hyperkalemia 02/28/2018   Hypertension    Hypothyroidism 10/10/2011   Iron deficiency anemia due to chronic blood loss    Squamous cell carcinoma of skin 06/15/2019   Right posterior ear. SCCis, hypertrophic, crusted    Thrombocytopenia ([place]) 10/11/2011   Type 2 diabetes with nephropathy [facility])    DM refresher course [place] (04/2013)    Past Surgical History:  Procedure Laterality Date   BACK SURGERY     cervical neck   CATARACT EXTRACTION, BILATERAL Bilateral 2017   COLONOSCOPY  11/2008   1 polyp, diverticulosis, rec rpt 5 yrs (Dr. Oletta Lamas, [name])   COLONOSCOPY  06/2014   hyperplastic polyp, rpt 5 yrs Oletta Lamas)   COLONOSCOPY WITH PROPOFOL N/A 11/17/2017   TA, HP, (Demarkis Gheen, Tally Due, MD)   ESOPHAGOGASTRODUODENOSCOPY (EGD) WITH PROPOFOL N/A 11/17/2017   healing erosive gastritis, intestinal metaplasia, neg H pylori (Denene Alamillo, Tally Due, MD)   ESOPHAGOGASTRODUODENOSCOPY (EGD) WITH PROPOFOL N/A 06/28/2019   erosive gastropathy with stigmata of recent bleed, granular tissue in esophagus biopsied- reflux esophagitis, small HH (Ellsie Violette, Tally Due, MD)   EYE SURGERY  2012   laser surgery for glaucoma   PORTACATH PLACEMENT Left 12/09/2018   Procedure: INSERTION PORT-A-CATH;  Surgeon: [name], MD;  Location: [facility] ORS;  Service: General;  Laterality: Left;   PTCA  1994, 1995   US ECHOCARDIOGRAPHY  10/2013   inferior wall hypokinesis, mild LVH, EF 50-55%, mild MR  and LA dilation    Current Outpatient Medications:    acetaminophen (TYLENOL) 650 MG CR tablet, Take 1,300 mg by mouth 2 (two) times daily., Disp: , Rfl:    amLODipine (NORVASC) 5 MG tablet, Take 1 tablet by mouth once daily, Disp: 90 tablet, Rfl: 0   atorvastatin (LIPITOR) 80 MG tablet, Take 1 tablet by mouth once daily, Disp: 39 tablet, Rfl: 0   carvedilol (COREG) 6.25 MG tablet, TAKE 1 TABLET BY MOUTH TWICE DAILY WITH MEALS, Disp: 180 tablet, Rfl: 0   Continuous Blood Gluc Receiver (FREESTYLE LIBRE 14 DAY READER) DEVI, Use as directed to check sugars QID AC HS, Disp: 2 each, Rfl: 1   Continuous Blood Gluc Sensor (FREESTYLE LIBRE 14 DAY SENSOR) MISC, Use as directed to check sugars QID HS, Disp: 2 each, Rfl: 11   ezetimibe (ZETIA) 10 MG tablet, Take  1 tablet (10 mg total) by mouth daily., Disp: 90 tablet, Rfl: 3   insulin detemir (LEVEMIR FLEXTOUCH) 100 UNIT/ML FlexPen, Inject 14 Units into the skin daily., Disp: 1 mL, Rfl: 0   levothyroxine (SYNTHROID) 88 MCG tablet, Take 1 tablet (88 mcg total) by mouth daily., Disp: 90 tablet, Rfl: 3   lipase/protease/amylase (CREON) 36000 UNITS CPEP capsule, Take 2 capsules with the first bite of each meal and 1 capsule with the first bite of each snack, Disp: 240 capsule, Rfl: 2   lisinopril (ZESTRIL) 5 MG tablet, Take 1 tablet (5 mg total) by mouth at bedtime., Disp: 90 tablet, Rfl: 0   nitroGLYCERIN (NITROSTAT) 0.4 MG SL tablet, Place 1 tablet (0.4 mg total) under the tongue every 5 (five) minutes as needed for chest pain ((MAX of 3 doses))., Disp: 25 tablet, Rfl: 0   NOVOLOG FLEXPEN 100 UNIT/ML FlexPen, Inject 3-4 Units into the skin 3 (three) times daily with meals., Disp: 15 mL, Rfl: 0   oxymetazoline (AFRIN) 0.05 % nasal spray, Place 1 spray into left nostril daily as needed (nose bleeds)., Disp: , Rfl:    traZODone (DESYREL) 100 MG tablet, TAKE 1 TABLET BY MOUTH AT BEDTIME AS NEEDED FOR SLEEP, Disp: 90 tablet, Rfl: 0   triamcinolone ointment (KENALOG) 0.5 %, Apply 1 application topically 2 (two) times daily., Disp: 30 g, Rfl: 1 No current facility-administered medications for this visit.  Facility-Administered Medications Ordered in Other Visits:    sodium chloride flush (NS) 0.9 % injection 10 mL, 10 mL, Intravenous, Once, [place], Kathlene November, MD    Family History  Problem Relation Age of Onset   Diabetes Sister    Stomach cancer Sister 43   Pneumonia Father        caused death   Other Brother        no communication with brother so unsure of any health conditions   Coronary artery disease Son 104       5v CABG and stents   Hyperlipidemia Sister    Stroke Neg Hx    Heart attack Neg Hx      Social History   Tobacco Use   Smoking status: Former    Types: Cigarettes    Quit date:  03/04/1978    Years since quitting: 43.1   Smokeless tobacco: Never  Vaping Use   Vaping Use: Never used  Substance Use Topics   Alcohol use: No   Drug use: No    Allergies as of 04/03/2021   (No Known Allergies)    Review of Systems:    All systems reviewed and negative except where  noted in HPI.   Physical Exam:  BP 118/62 (BP Location: Left Arm, Patient Position: Sitting, Cuff Size: Normal)    Pulse (!) 50    Temp 97.8 F (36.6 C) (Oral)    Ht 5\' 11"  (1.803 m)    Wt 148 lb 8 oz (67.4 kg)    BMI 20.71 kg/m  No LMP for male patient.  General:   Alert,  Well-developed, well-nourished, pleasant and cooperative in NAD Head:  Normocephalic and atraumatic. Eyes:  Sclera clear, no icterus.   Conjunctiva pink. Ears:  Normal auditory acuity, lesion behind the right auricle. Nose:  No deformity, discharge, or lesions. Mouth:  No deformity or lesions,oropharynx pink & moist. Neck:  Supple; no masses or thyromegaly. Lungs:  Respirations even and unlabored.  Clear throughout to auscultation.   No wheezes, crackles, or rhonchi. No acute distress. Heart:  Regular rate and rhythm; no murmurs, clicks, rubs, or gallops. Abdomen:  Normal bowel sounds. Soft, mild periumbilical tenderness and non-distended without masses, hepatosplenomegaly or hernias noted.  No guarding or rebound tenderness.   Rectal: Not performed Msk:  Symmetrical without gross deformities. Good, equal movement & strength bilaterally. Pulses:  Normal pulses noted. Extremities:  No clubbing or edema.  No cyanosis. Neurologic:  Alert and oriented x3;  grossly normal neurologically. Skin: Dry scaly skin with maculopapular rash, petechiae no jaundice. Lymph Nodes:  No significant cervical adenopathy. Psych:  Alert and cooperative. Normal mood and affect.  Imaging Studies: No abdominal imaging  Assessment and Plan:   Scott [name] is a 81 y.o. Caucasian male with CLL with anemia and Thrombocytopenia, s/p rituximab, history  of iron deficiency anemia which has currently resolved, history of focal intestinal metaplasia of the stomach, duodenal ulcers which have resolved, no evidence of H. pylori, history of tubular adenoma of colon is seen for follow-up of central abdominal pain with weight loss.   Central abdominal pain with weight loss: Secondary to severe EPI  History of diffuse pancreatic atrophy and fatty pancreas pancreatic fecal elastase levels less than 50 Continue Creon 36K 2 capsules with each meal and 1 with snack Patient has ongoing weight loss, concern if it is secondary to poorly controlled diabetes.  Strongly urged patient to discuss with Dr. Danise Mina during his next follow-up appointment on 2/9 If patient continues to have ongoing weight loss, will proceed with cross-sectional imaging  Patient is no longer iron deficient.  He does have mild normocytic anemia secondary to underlying CLL  Focal intestinal metaplasia in stomach: Only 1 fragment H. pylori IgG normal  Dysphagia: Currently resolved EGD revealed normal esophagus.  GE junction biopsies revealed reflux esophagitis.  Treated with omeprazole 40 mg daily before breakfast.  Patient is no longer on acid suppressive therapy Monitor for now  Personal history of colon adenoma in 2019: Repeat colonoscopy in 2024  Follow up in 6 months   [name], MD

## 2021-04-10 ENCOUNTER — Other Ambulatory Visit: Payer: Self-pay | Admitting: Family Medicine

## 2021-04-11 ENCOUNTER — Other Ambulatory Visit: Payer: Self-pay | Admitting: Family Medicine

## 2021-04-11 ENCOUNTER — Other Ambulatory Visit: Payer: Self-pay | Admitting: Cardiovascular Disease

## 2021-04-11 DIAGNOSIS — I10 Essential (primary) hypertension: Secondary | ICD-10-CM

## 2021-04-11 MED ORDER — ATORVASTATIN CALCIUM 80 MG PO TABS: 80.0000 mg | ORAL_TABLET | Freq: Every day | ORAL | 0 refills | Status: DC

## 2021-04-11 NOTE — Telephone Encounter (Signed)
E-scribed refills.  

## 2021-04-11 NOTE — Telephone Encounter (Signed)
[place] Night - Client TELEPHONE ADVICE RECORD [facility] Patient Name: Scott [name] Gender: Male DOB: 09-11-1940 Age: 81 Y 10 M 8 D Return Phone Number: [phone] (Primary) Address: City/ State/ Zip: [place]  [address] Client [facility] [place] Night - Client Client Site [place] Provider [name] - MD Contact Type Call Who Is Calling Patient / Member / Family / Caregiver Call Type Triage / Clinical Relationship To Patient Self Return Phone Number [phone] (Primary) Chief Complaint Prescription Refill or Medication Request (non symptomatic) Reason for Call Symptomatic / Request for Health Information Initial Comment Caller needs refills on his blood pressure and cholesterol. He is out of both medications. No symptoms were mentioned. Translation No Disp. Time Eilene Ghazi Time) Disposition Final User 04/10/2021 6:17:29 PM Attempt made - message left Wandra Arthurs 04/10/2021 6:34:43 PM Attempt made - message left Wandra Arthurs 04/10/2021 6:53:26 PM FINAL ATTEMPT MADE - message left Wandra Arthurs 04/10/2021 6:53:39 PM Send to RN Final Attempt Wonda Olds, RN, Santiago Glad 04/10/2021 9:22:17 PM Attempt made - message left Candal, RN, Marolyn Hammock 04/10/2021 9:31:31 PM FINAL ATTEMPT MADE - message left Yes Candal, RN, Legrand Como

## 2021-04-12 ENCOUNTER — Ambulatory Visit: Payer: Medicare Other

## 2021-04-12 ENCOUNTER — Telehealth: Payer: Self-pay | Admitting: Family Medicine

## 2021-04-12 DIAGNOSIS — N183 Chronic kidney disease, stage 3 unspecified: Secondary | ICD-10-CM

## 2021-04-12 DIAGNOSIS — K8681 Exocrine pancreatic insufficiency: Secondary | ICD-10-CM

## 2021-04-12 DIAGNOSIS — N1832 Chronic kidney disease, stage 3b: Secondary | ICD-10-CM

## 2021-04-12 DIAGNOSIS — E1122 Type 2 diabetes mellitus with diabetic chronic kidney disease: Secondary | ICD-10-CM

## 2021-04-12 NOTE — Telephone Encounter (Signed)
Spoke with pt asking about Dr. Synthia Innocent message.   Pt states confirms he is using [employer].  Recent readings:  04/10/21 @ 9:00 AM (fasting)- 108 04/11/21 @ 8:00 AM (fasting)- 105 04/11/21 @ 5:30  PM (3-4 hrs after meal)- 255 04/11/21 @ 10:00 PM (3-4 hrs after meal)- 286  I relayed message about DM mgmt assistance.  Pt verbalizes understanding and agrees with plan.

## 2021-04-12 NOTE — Telephone Encounter (Signed)
Received notice from GI about recent worsening sugar control.  He's not seeing me until next month. Can we reach out to patient to see if he's now using Freestyle Libre CGM, and get some recent sugar readings (both fasting and postprandial)? Ensure he's regularly taking his levemir 14u daily as well as his novolog 3-4 units with meals.  I have also placed a referral for CCM for diabetes management assistance.  He previously refused to return to endo seen early 2022 due to poor experience.

## 2021-04-13 NOTE — Telephone Encounter (Addendum)
Fasting readings great, post-mealtime readings too high.  I'd like him to check 2 hours after meal and send [place] readings.  Is he regularly taking his novolog 3-4 units with meals? We will likely need to increase this dose.

## 2021-04-13 NOTE — Telephone Encounter (Signed)
Spoke with pt relaying Dr. Synthia Innocent message.  Pt verbalizes understanding and confirms he is taking novolog 3-4 U with meals.  Says he will get some readings closer to the 2 hr timeframe after a meal and let Dr. Darnell Level know.

## 2021-04-17 NOTE — Telephone Encounter (Signed)
Scott [name] called in and stated the insurance is needing a code to show that he needs the [facility] all the time and they would cover it at 100% because right hes paying 80$ for the sensors.

## 2021-04-18 DIAGNOSIS — U071 COVID-19: Secondary | ICD-10-CM | POA: Diagnosis not present

## 2021-04-18 MED ORDER — FREESTYLE LIBRE 14 DAY SENSOR MISC: 11 refills | Status: DC

## 2021-04-18 NOTE — Addendum Note (Signed)
Addended by: Brenton Grills on: 7/70/3403 52:48 PM   Modules accepted: Orders

## 2021-04-18 NOTE — Telephone Encounter (Addendum)
Noted.  Sent new rx, with DM dx code(s) attached, to [facility].   Spoke with pt notifying him of above info.  Pt expresses his thanks.

## 2021-04-18 NOTE — Telephone Encounter (Signed)
Spoke to patient and was advised that his insurance company told him if the diagnosis codes would be provided to the pharmacy it should be cheaper for him to get.  Called and spoke to pharmacist at [facility] and was advised that a new script would need to be sent in for them to process the script to see if it will be cheaper for the patient.

## 2021-04-23 ENCOUNTER — Ambulatory Visit: Payer: Medicare Other | Admitting: Family Medicine

## 2021-04-28 ENCOUNTER — Other Ambulatory Visit: Payer: Self-pay

## 2021-04-28 ENCOUNTER — Emergency Department: Payer: Medicare HMO

## 2021-04-28 ENCOUNTER — Emergency Department
Admission: EM | Admit: 2021-04-28 | Discharge: 2021-04-28 | Disposition: A | Discharge status: Home / Self Care | Payer: Medicare HMO | Attending: Emergency Medicine | Admitting: Emergency Medicine

## 2021-04-28 DIAGNOSIS — E1122 Type 2 diabetes mellitus with diabetic chronic kidney disease: Secondary | ICD-10-CM | POA: Insufficient documentation

## 2021-04-28 DIAGNOSIS — I10 Essential (primary) hypertension: Secondary | ICD-10-CM | POA: Diagnosis not present

## 2021-04-28 DIAGNOSIS — I129 Hypertensive chronic kidney disease with stage 1 through stage 4 chronic kidney disease, or unspecified chronic kidney disease: Secondary | ICD-10-CM | POA: Insufficient documentation

## 2021-04-28 DIAGNOSIS — E162 Hypoglycemia, unspecified: Secondary | ICD-10-CM | POA: Diagnosis not present

## 2021-04-28 DIAGNOSIS — Z79899 Other long term (current) drug therapy: Secondary | ICD-10-CM | POA: Insufficient documentation

## 2021-04-28 DIAGNOSIS — Z794 Long term (current) use of insulin: Secondary | ICD-10-CM | POA: Insufficient documentation

## 2021-04-28 DIAGNOSIS — E039 Hypothyroidism, unspecified: Secondary | ICD-10-CM | POA: Diagnosis not present

## 2021-04-28 DIAGNOSIS — E1165 Type 2 diabetes mellitus with hyperglycemia: Secondary | ICD-10-CM | POA: Diagnosis not present

## 2021-04-28 DIAGNOSIS — N1832 Chronic kidney disease, stage 3b: Secondary | ICD-10-CM | POA: Insufficient documentation

## 2021-04-28 DIAGNOSIS — E161 Other hypoglycemia: Secondary | ICD-10-CM | POA: Diagnosis not present

## 2021-04-28 DIAGNOSIS — I491 Atrial premature depolarization: Secondary | ICD-10-CM | POA: Diagnosis not present

## 2021-04-28 DIAGNOSIS — I493 Ventricular premature depolarization: Secondary | ICD-10-CM | POA: Insufficient documentation

## 2021-04-28 DIAGNOSIS — R42 Dizziness and giddiness: Secondary | ICD-10-CM | POA: Diagnosis not present

## 2021-04-28 DIAGNOSIS — E1140 Type 2 diabetes mellitus with diabetic neuropathy, unspecified: Secondary | ICD-10-CM | POA: Diagnosis not present

## 2021-04-28 DIAGNOSIS — I2510 Atherosclerotic heart disease of native coronary artery without angina pectoris: Secondary | ICD-10-CM | POA: Diagnosis not present

## 2021-04-28 DIAGNOSIS — R001 Bradycardia, unspecified: Secondary | ICD-10-CM | POA: Diagnosis not present

## 2021-04-28 LAB — URINALYSIS, ROUTINE W REFLEX MICROSCOPIC
Bacteria, UA: NONE SEEN
Bilirubin Urine: NEGATIVE
Glucose, UA: NEGATIVE mg/dL
Hgb urine dipstick: NEGATIVE
Ketones, ur: 5 mg/dL — AB
Leukocytes,Ua: NEGATIVE
Nitrite: NEGATIVE
Protein, ur: 30 mg/dL — AB
Specific Gravity, Urine: 1.01 (ref 1.005–1.030)
pH: 5 (ref 5.0–8.0)

## 2021-04-28 LAB — TROPONIN I (HIGH SENSITIVITY)
Troponin I (High Sensitivity): 10 ng/L (ref <18)
Troponin I (High Sensitivity): 11 ng/L (ref <18)

## 2021-04-28 LAB — CBC WITH DIFFERENTIAL/PLATELET
Abs Immature Granulocytes: 0.03 10*3/uL (ref 0.00–0.07)
Basophils Absolute: 0 10*3/uL (ref 0.0–0.1)
Basophils Relative: 0 %
Eosinophils Absolute: 0 10*3/uL (ref 0.0–0.5)
Eosinophils Relative: 0 %
HCT: 35.8 % — ABNORMAL LOW (ref 39.0–52.0)
Hemoglobin: 11.3 g/dL — ABNORMAL LOW (ref 13.0–17.0)
Immature Granulocytes: 0 %
Lymphocytes Relative: 13 %
Lymphs Abs: 0.9 10*3/uL (ref 0.7–4.0)
MCH: 29.7 pg (ref 26.0–34.0)
MCHC: 31.6 g/dL (ref 30.0–36.0)
MCV: 94.2 fL (ref 80.0–100.0)
Monocytes Absolute: 0.4 10*3/uL (ref 0.1–1.0)
Monocytes Relative: 5 %
Neutro Abs: 5.8 10*3/uL (ref 1.7–7.7)
Neutrophils Relative %: 82 %
Platelets: 120 10*3/uL — ABNORMAL LOW (ref 150–400)
RBC: 3.8 MIL/uL — ABNORMAL LOW (ref 4.22–5.81)
RDW: 12.7 % (ref 11.5–15.5)
WBC: 7.1 10*3/uL (ref 4.0–10.5)
nRBC: 0 % (ref 0.0–0.2)

## 2021-04-28 LAB — COMPREHENSIVE METABOLIC PANEL
ALT: 42 U/L (ref 0–44)
AST: 34 U/L (ref 15–41)
Albumin: 4.1 g/dL (ref 3.5–5.0)
Alkaline Phosphatase: 89 U/L (ref 38–126)
Anion gap: 12 (ref 5–15)
BUN: 57 mg/dL — ABNORMAL HIGH (ref 8–23)
CO2: 24 mmol/L (ref 22–32)
Calcium: 9.6 mg/dL (ref 8.9–10.3)
Chloride: 104 mmol/L (ref 98–111)
Creatinine, Ser: 1.74 mg/dL — ABNORMAL HIGH (ref 0.61–1.24)
GFR, Estimated: 39 mL/min — ABNORMAL LOW (ref >60)
Glucose, Bld: 101 mg/dL — ABNORMAL HIGH (ref 70–99)
Potassium: 4.1 mmol/L (ref 3.5–5.1)
Sodium: 140 mmol/L (ref 135–145)
Total Bilirubin: 0.7 mg/dL (ref 0.3–1.2)
Total Protein: 6.9 g/dL (ref 6.5–8.1)

## 2021-04-28 LAB — CBG MONITORING, ED: Glucose-Capillary: 111 mg/dL — ABNORMAL HIGH (ref 70–99)

## 2021-04-28 LAB — SAMPLE TO BLOOD BANK

## 2021-04-28 LAB — MAGNESIUM: Magnesium: 1.8 mg/dL (ref 1.7–2.4)

## 2021-04-28 MED ORDER — SODIUM CHLORIDE 0.9 % IV BOLUS (SEPSIS)
500.0000 mL | Freq: Once | INTRAVENOUS | Status: AC
Start: 2021-04-28 — End: 2021-04-28
  Administered 2021-04-28: 500 mL via INTRAVENOUS

## 2021-04-28 MED ORDER — MECLIZINE HCL 12.5 MG PO TABS: 12.5000 mg | ORAL_TABLET | Freq: Three times a day (TID) | ORAL | 0 refills | Status: DC | PRN

## 2021-04-28 MED ORDER — ONDANSETRON HCL 4 MG/2ML IJ SOLN
4.0000 mg | Freq: Once | INTRAMUSCULAR | Status: AC
  Administered 2021-04-28: 4 mg via INTRAVENOUS
  Filled 2021-04-28: qty 2

## 2021-04-28 MED ORDER — MECLIZINE HCL 25 MG PO TABS
25.0000 mg | ORAL_TABLET | Freq: Once | ORAL | Status: AC
  Administered 2021-04-28: 25 mg via ORAL
  Filled 2021-04-28: qty 1

## 2021-04-28 NOTE — ED Provider Notes (Signed)
[facility] Provider Note    Event Date/Time   First MD Initiated Contact with Patient 04/28/21 [phone]     (approximate)   History   Dizziness and Nausea   HPI  Scott [name] is a 81 y.o. male with history of anemia status post previous blood transfusions, CKD, CLL, hypertension, hyperlipidemia, diabetes who presents to the emergency department EMS with vertigo.  States he went to bed around 10 PM and felt fine and woke up about an hour ago and felt like the room was spinning and has had nausea and vomiting.  He states he felt very weak and was unable to walk.  EMS reports he was in bigeminy but otherwise hemodynamically stable.  He denies any fevers, cough, chest pain, shortness of breath, abdominal pain, diarrhea, dysuria, bloody stools or melena.  States he was last transfused over a year ago.  He has had vertigo once before.  No previous history of stroke.  Denies numbness, tingling or focal weakness.  No ear pain, tinnitus or hearing loss.  Symptoms are worse when he turns his head or looks to the left or when he looks up.   History provided by patient and EMS.    Past Medical History:  Diagnosis Date   Acquired hand deformity 1962   hand saw accident at work   Anemia 10/11/2011   Arthritis    Bradycardia 10/10/2011   CAD (coronary artery disease) 10/10/2011   MI s/p PTCA (Dx-OM2 proximal concentric stenosis)   Carotid artery disease (HCC)    Cellulitis of buttock, left 11/17/2018   Chronic edema    CKD (chronic kidney disease) stage 3, GFR 30-59 ml/min (HCC)    [name]   CLL (chronic lymphocytic leukemia) ([place])    Colon polyps    Cough 04/28/2019   Dysphagia    EGD - reflux esophagitis, one area of stomach with focal intestinal metaplasia (06/2019) Dr Marius Ditch   Generalized headaches    frequent   GI bleed 07/08/2017   Glaucoma    s/p laser surgery   HLD (hyperlipidemia)    Hyperkalemia 02/28/2018   Hypertension    Hypothyroidism 10/10/2011   Iron  deficiency anemia due to chronic blood loss    Squamous cell carcinoma of skin 06/15/2019   Right posterior ear. SCCis, hypertrophic, crusted   Thrombocytopenia ([place]) 10/11/2011   Type 2 diabetes with nephropathy [facility])    DM refresher course [place] (04/2013)    Past Surgical History:  Procedure Laterality Date   BACK SURGERY     cervical neck   CATARACT EXTRACTION, BILATERAL Bilateral 2017   COLONOSCOPY  11/2008   1 polyp, diverticulosis, rec rpt 5 yrs (Dr. Oletta Lamas, [name])   COLONOSCOPY  06/2014   hyperplastic polyp, rpt 5 yrs Oletta Lamas)   COLONOSCOPY WITH PROPOFOL N/A 11/17/2017   TA, HP, (Vanga, Tally Due, MD)   ESOPHAGOGASTRODUODENOSCOPY (EGD) WITH PROPOFOL N/A 11/17/2017   healing erosive gastritis, intestinal metaplasia, neg H pylori (Vanga, Tally Due, MD)   ESOPHAGOGASTRODUODENOSCOPY (EGD) WITH PROPOFOL N/A 06/28/2019   erosive gastropathy with stigmata of recent bleed, granular tissue in esophagus biopsied- reflux esophagitis, small HH (Vanga, Tally Due, MD)   EYE SURGERY  2012   laser surgery for glaucoma   PORTACATH PLACEMENT Left 12/09/2018   Procedure: INSERTION PORT-A-CATH;  Surgeon: [name], MD;  Location: [facility] ORS;  Service: General;  Laterality: Left;   PTCA  1994, 1995   US ECHOCARDIOGRAPHY  10/2013   inferior wall hypokinesis, mild  LVH, EF 50-55%, mild MR and LA dilation    MEDICATIONS:  Prior to Admission medications   Medication Sig Start Date End Date Taking? Authorizing Provider  meclizine (ANTIVERT) 12.5 MG tablet Take 1 tablet (12.5 mg total) by mouth 3 (three) times daily as needed for dizziness. 04/28/21  Yes Merlyn Lot, MD  acetaminophen (TYLENOL) 650 MG CR tablet Take 1,300 mg by mouth 2 (two) times daily.    [provider]  amLODipine (NORVASC) 5 MG tablet Take 1 tablet by mouth once daily 04/11/21   [name], MD  atorvastatin (LIPITOR) 80 MG tablet Take 1 tablet (80 mg total) by mouth daily. 04/11/21   [name], MD   carvedilol (COREG) 6.25 MG tablet TAKE 1 TABLET BY MOUTH TWICE DAILY WITH MEALS 04/11/21   [name], MD  Continuous Blood Gluc Receiver (FREESTYLE LIBRE 14 DAY READER) DEVI Use as directed to check sugars QID AC HS 09/13/20   [name], MD  Continuous Blood Gluc Sensor (FREESTYLE LIBRE 14 DAY SENSOR) MISC Use as directed to check sugars QID HS 04/18/21   [name], MD  ezetimibe (ZETIA) 10 MG tablet Take 1 tablet by mouth once daily 04/11/21   Minna Merritts, MD  insulin detemir (LEVEMIR FLEXTOUCH) 100 UNIT/ML FlexPen Inject 14 Units into the skin daily. 03/02/21   [name], MD  levothyroxine (SYNTHROID) 88 MCG tablet Take 1 tablet (88 mcg total) by mouth daily. 04/19/20   [name], MD  lipase/protease/amylase (CREON) [phone] UNITS CPEP capsule Take 2 capsules with the first bite of each meal and 1 capsule with the first bite of each snack 12/12/20   Lin Landsman, MD  lisinopril (ZESTRIL) 5 MG tablet Take 1 tablet (5 mg total) by mouth at bedtime. 03/02/21   [name], MD  nitroGLYCERIN (NITROSTAT) 0.4 MG SL tablet Place 1 tablet (0.4 mg total) under the tongue every 5 (five) minutes as needed for chest pain ((MAX of 3 doses)). 02/27/18   [name], MD  NOVOLOG FLEXPEN 100 UNIT/ML FlexPen Inject 3-4 Units into the skin 3 (three) times daily with meals. 03/02/21   [name], MD  oxymetazoline (AFRIN) 0.05 % nasal spray Place 1 spray into left nostril daily as needed (nose bleeds).    [provider]  traZODone (DESYREL) 100 MG tablet TAKE 1 TABLET BY MOUTH AT BEDTIME AS NEEDED FOR SLEEP 03/02/21   [name], MD  triamcinolone ointment (KENALOG) 0.5 % Apply 1 application topically 2 (two) times daily. 08/24/20   Owens Loffler, MD    Physical Exam   Triage Vital Signs: ED Triage Vitals  Enc Vitals Group     BP --      Pulse Rate 04/28/21 0805 91     Resp 04/28/21 0805 (!) 22     Temp --      Temp src --       SpO2 04/28/21 0805 98 %     Weight 04/28/21 0624 148 lb (67.1 kg)     Height 04/28/21 0624 5\' 11"  (1.803 m)     Head Circumference --      Peak Flow --      Pain Score --      Pain Loc --      Pain Edu? --      Excl. in [place]? --     Most recent vital signs: Vitals:   04/28/21 0835 04/28/21 0840  Pulse: 72 78  Resp: 13 18  SpO2: 99% 98%  CONSTITUTIONAL: Alert and oriented and responds appropriately to questions. Well-appearing; well-nourished, elderly HEAD: Normocephalic, atraumatic EYES: Conjunctivae clear, pupils appear equal, sclera nonicteric ENT: normal nose; moist mucous membranes; TMs are clear bilaterally without erythema, purulence, bulging, perforation, effusion.  No cerumen impaction or sign of foreign body in the external auditory canal. No inflammation, erythema or drainage from the external auditory canal. No signs of mastoiditis. No pain with manipulation of the pinna bilaterally. NECK: Supple, normal ROM CARD: RRR; S1 and S2 appreciated; no murmurs, no clicks, no rubs, no gallops RESP: Normal chest excursion without splinting or tachypnea; breath sounds clear and equal bilaterally; no wheezes, no rhonchi, no rales, no hypoxia or respiratory distress, speaking full sentences ABD/GI: Normal bowel sounds; non-distended; soft, non-tender, no rebound, no guarding, no peritoneal signs RECTAL:  Normal rectal tone, no gross blood or melena, guaiac NEGATIVE, no hemorrhoids appreciated, nontender rectal exam, no fecal impaction. Chaperone present. BACK: The back appears normal EXT: Normal ROM in all joints; no deformity noted, no edema; no cyanosis SKIN: Normal color for age and race; warm; no rash on exposed skin NEURO: Moves all extremities equally, normal speech, normal sensation diffusely, no pronator drift, cranial nerves II through XII intact PSYCH: The patient's mood and manner are appropriate.   ED Results / Procedures / Treatments   LABS: (all labs ordered are  listed, but only abnormal results are displayed) Labs Reviewed  CBC WITH DIFFERENTIAL/PLATELET - Abnormal; Notable for the following components:      Result Value   RBC 3.80 (*)    Hemoglobin 11.3 (*)    HCT 35.8 (*)    Platelets 120 (*)    All other components within normal limits  COMPREHENSIVE METABOLIC PANEL - Abnormal; Notable for the following components:   Glucose, Bld 101 (*)    BUN 57 (*)    Creatinine, Ser 1.74 (*)    GFR, Estimated 39 (*)    All other components within normal limits  MAGNESIUM  URINALYSIS, ROUTINE W REFLEX MICROSCOPIC  CBG MONITORING, ED  SAMPLE TO BLOOD BANK  TROPONIN I (HIGH SENSITIVITY)  TROPONIN I (HIGH SENSITIVITY)     EKG:  EKG Interpretation  Date/Time:  Saturday April 28 2021 06:22:37 EST Ventricular Rate:  72 PR Interval:    QRS Duration: 100 QT Interval:  458 QTC Calculation: 469 R Axis:   41 Text Interpretation: Atrial fibrillation Multiple ventricular premature complexes Confirmed by Pryor Curia [phone]) on 04/28/2021 8:48:14 AM         RADIOLOGY: My personal review and interpretation of imaging: MRI brain shows no acute infarct  I have personally reviewed all radiology reports.   MR BRAIN WO CONTRAST  Result Date: 04/28/2021 CLINICAL DATA:  Sudden onset of dizziness and nausea beginning 1 hour prior to arrival. EXAM: MRI HEAD WITHOUT CONTRAST TECHNIQUE: Multiplanar, multiecho pulse sequences of the brain and surrounding structures were obtained without intravenous contrast. COMPARISON:  Head CT 06/11/2019 FINDINGS: Brain: No acute infarction, hemorrhage, hydrocephalus, extra-axial collection or mass lesion. Two small remote right cerebellar infarcts. Cerebral volume loss and mild periventricular chronic small vessel ischemia. Vascular: Normal flow voids Skull and upper cervical spine: Normal marrow signal Sinuses/Orbits: Bilateral cataract resection. Minimal opacification of the left sphenoid sinus. IMPRESSION: 1. No acute  finding. 2. Mild chronic small vessel disease. Two small remote right cerebellar infarcts. Electronically Signed   By: Jorje Guild M.D.   On: 04/28/2021 07:44     PROCEDURES:  Critical Care performed: No   CRITICAL CARE  Performed by: Pryor Curia   Total critical care time: 0 minutes  Critical care time was exclusive of separately billable procedures and treating other patients.  Critical care was necessary to treat or prevent imminent or life-threatening deterioration.  Critical care was time spent personally by me on the following activities: development of treatment plan with patient and/or surrogate as well as nursing, discussions with consultants, evaluation of patient's response to treatment, examination of patient, obtaining history from patient or surrogate, ordering and performing treatments and interventions, ordering and review of laboratory studies, ordering and review of radiographic studies, pulse oximetry and re-evaluation of patient's condition.   Marland Kitchen1-3 Lead EKG Interpretation Performed by: Laynie Espy, Delice Bison, DO Authorized by: Khamari Yousuf, Delice Bison, DO     Interpretation: abnormal     ECG rate:  75   ECG rate assessment: normal     Rhythm: sinus rhythm     Ectopy: PVCs     Conduction: normal      IMPRESSION / MDM / ASSESSMENT AND PLAN / ED COURSE  I reviewed the triage vital signs and the nursing notes.    Patient here with vertigo, vomiting.  The patient is on the cardiac monitor to evaluate for evidence of arrhythmia and/or significant heart rate changes.   DIFFERENTIAL DIAGNOSIS (includes but not limited to):   Peripheral vertigo, CVA, anemia, dehydration, electrolyte derangement, less likely ACS, PE, dissection, intracranial hemorrhage   PLAN: We will obtain CBC, BMP, troponin, urinalysis.  Will give IV fluids, meclizine and Zofran.  Will obtain MRI of the brain.  EKG appears to be sinus arrhythmia versus atrial fibrillation with frequent PVCs.  No  previous history of A-fib.  We will repeat his EKG.   MEDICATIONS GIVEN IN ED: Medications  sodium chloride 0.9 % bolus 500 mL (500 mLs Intravenous New Bag/Given 04/28/21 3267)  meclizine (ANTIVERT) tablet 25 mg (25 mg Oral Given 04/28/21 0633)  ondansetron (ZOFRAN) injection 4 mg (4 mg Intravenous Given 04/28/21 1245)     ED COURSE: Patient's labs show stable hemoglobin of 11.3.  Normal electrolytes.  Chronic kidney disease which appears stable compared to previous.  First troponin is negative.  MRI of the brain reviewed by myself and radiologist and shows no acute infarct.  Will obtain second troponin, urinalysis, repeat EKG.  Will p.o. challenge, ambulate.  Signed out the oncoming ED physician.   CONSULTS: Patient may need admission if he is not able to tolerate p.o., ambulate.   OUTSIDE RECORDS REVIEWED: Reviewed patient's last video visit with family medicine Tabitha Dugal on 03/13/2021.   I reviewed all nursing notes and pertinent previous records as available.  I have reviewed and interpreted any and all EKGs, lab and urine results, imaging and radiology reports (as available).          FINAL CLINICAL IMPRESSION(S) / ED DIAGNOSES   Final diagnoses:  Dizziness  Vertigo  PVC's (premature ventricular contractions)     Rx / DC Orders   ED Discharge Orders          Ordered    meclizine (ANTIVERT) 12.5 MG tablet  3 times daily PRN        04/28/21 0844             Note:  This document was prepared using Dragon voice recognition software and may include unintentional dictation errors.   Adiyah Lame, Delice Bison, DO 04/28/21 [phone]

## 2021-04-28 NOTE — ED Provider Notes (Signed)
Patient received in signout from Dr. Leonides Schanz pending imaging and laboratory work-up.  Lab results are reassuring.  MRI without acute abnormality.  Patient felt significant improvement after meclizine.  Has a history of vertigo.  Suspect this is peripheral vertigo in nature.  Patient remains well-appearing does appear stable and appropriate for outpatient follow-up.  No additional concerns related from family or the patient.  Discussed strict return precautions.   Scott Lot, MD 04/28/21 1004

## 2021-04-28 NOTE — ED Notes (Signed)
Pt in MRI.

## 2021-04-28 NOTE — ED Triage Notes (Signed)
Patient to [facility] via EMS from home for sudden onset of dizziness and nausea approximately 1 hr prior to arrival.  EMS interventions -- 12-lead with PVCs and runs of PVCs with heart rate 48-70, bp 170/100, pulse oxi 97% on room air.

## 2021-04-28 NOTE — ED Notes (Signed)
Back from MRI, EDP at [facility], family at [facility]. Pt alert, NAD, calm, interactive.

## 2021-04-29 ENCOUNTER — Other Ambulatory Visit: Payer: Self-pay | Admitting: Family Medicine

## 2021-04-29 DIAGNOSIS — E02 Subclinical iodine-deficiency hypothyroidism: Secondary | ICD-10-CM

## 2021-05-08 ENCOUNTER — Other Ambulatory Visit: Payer: Self-pay | Admitting: *Deleted

## 2021-05-08 DIAGNOSIS — D649 Anemia, unspecified: Secondary | ICD-10-CM

## 2021-05-08 DIAGNOSIS — C9110 Chronic lymphocytic leukemia of B-cell type not having achieved remission: Secondary | ICD-10-CM

## 2021-05-09 ENCOUNTER — Ambulatory Visit (INDEPENDENT_AMBULATORY_CARE_PROVIDER_SITE_OTHER): Payer: Medicare Other | Admitting: Family Medicine

## 2021-05-09 ENCOUNTER — Other Ambulatory Visit: Payer: Self-pay

## 2021-05-09 ENCOUNTER — Telehealth: Payer: Self-pay | Admitting: Radiology

## 2021-05-09 ENCOUNTER — Encounter: Payer: Self-pay | Admitting: Family Medicine

## 2021-05-09 VITALS — BP 120/80 | HR 62 | Temp 97.4°F | Ht 69.5 in | Wt 149.3 lb

## 2021-05-09 DIAGNOSIS — E785 Hyperlipidemia, unspecified: Secondary | ICD-10-CM

## 2021-05-09 DIAGNOSIS — E039 Hypothyroidism, unspecified: Secondary | ICD-10-CM

## 2021-05-09 DIAGNOSIS — I10 Essential (primary) hypertension: Secondary | ICD-10-CM | POA: Diagnosis not present

## 2021-05-09 DIAGNOSIS — D631 Anemia in chronic kidney disease: Secondary | ICD-10-CM

## 2021-05-09 DIAGNOSIS — Z0001 Encounter for general adult medical examination with abnormal findings: Secondary | ICD-10-CM | POA: Insufficient documentation

## 2021-05-09 DIAGNOSIS — C9110 Chronic lymphocytic leukemia of B-cell type not having achieved remission: Secondary | ICD-10-CM | POA: Diagnosis not present

## 2021-05-09 DIAGNOSIS — E875 Hyperkalemia: Secondary | ICD-10-CM

## 2021-05-09 DIAGNOSIS — E1122 Type 2 diabetes mellitus with diabetic chronic kidney disease: Secondary | ICD-10-CM | POA: Diagnosis not present

## 2021-05-09 DIAGNOSIS — I6523 Occlusion and stenosis of bilateral carotid arteries: Secondary | ICD-10-CM

## 2021-05-09 DIAGNOSIS — E1169 Type 2 diabetes mellitus with other specified complication: Secondary | ICD-10-CM

## 2021-05-09 DIAGNOSIS — K8681 Exocrine pancreatic insufficiency: Secondary | ICD-10-CM | POA: Diagnosis not present

## 2021-05-09 DIAGNOSIS — H8110 Benign paroxysmal vertigo, unspecified ear: Secondary | ICD-10-CM

## 2021-05-09 DIAGNOSIS — H9193 Unspecified hearing loss, bilateral: Secondary | ICD-10-CM | POA: Diagnosis not present

## 2021-05-09 DIAGNOSIS — N1831 Chronic kidney disease, stage 3a: Secondary | ICD-10-CM | POA: Diagnosis not present

## 2021-05-09 DIAGNOSIS — Z794 Long term (current) use of insulin: Secondary | ICD-10-CM

## 2021-05-09 DIAGNOSIS — N1832 Chronic kidney disease, stage 3b: Secondary | ICD-10-CM | POA: Diagnosis not present

## 2021-05-09 LAB — RENAL FUNCTION PANEL
Albumin: 4.4 g/dL (ref 3.5–5.2)
BUN: 43 mg/dL — ABNORMAL HIGH (ref 6–23)
CO2: 29 mEq/L (ref 19–32)
Calcium: 10 mg/dL (ref 8.4–10.5)
Chloride: 106 mEq/L (ref 96–112)
Creatinine, Ser: 1.68 mg/dL — ABNORMAL HIGH (ref 0.40–1.50)
GFR: 38.03 mL/min — ABNORMAL LOW (ref >60.00)
Glucose, Bld: 204 mg/dL — ABNORMAL HIGH (ref 70–99)
Phosphorus: 4 mg/dL (ref 2.3–4.6)
Potassium: 6.6 mEq/L (ref 3.5–5.1)
Sodium: 141 mEq/L (ref 135–145)

## 2021-05-09 LAB — LIPID PANEL
Cholesterol: 131 mg/dL (ref 0–200)
HDL: 44.6 mg/dL (ref >39.00)
NonHDL: 86.49
Total CHOL/HDL Ratio: 3
Triglycerides: 255 mg/dL — ABNORMAL HIGH (ref 0.0–149.0)
VLDL: 51 mg/dL — ABNORMAL HIGH (ref 0.0–40.0)

## 2021-05-09 LAB — TSH: TSH: 3.37 u[IU]/mL (ref 0.35–5.50)

## 2021-05-09 LAB — LDL CHOLESTEROL, DIRECT: Direct LDL: 69 mg/dL

## 2021-05-09 LAB — VITAMIN D 25 HYDROXY (VIT D DEFICIENCY, FRACTURES): VITD: 48.08 ng/mL (ref 30.00–100.00)

## 2021-05-09 LAB — HEMOGLOBIN A1C: Hgb A1c MFr Bld: 8.6 % — ABNORMAL HIGH (ref 4.6–6.5)

## 2021-05-09 MED ORDER — MECLIZINE HCL 12.5 MG PO TABS
12.5000 mg | ORAL_TABLET | Freq: Two times a day (BID) | ORAL | 0 refills | Status: DC | PRN
Start: 2021-05-09 — End: 2021-08-20

## 2021-05-09 NOTE — Assessment & Plan Note (Signed)
Recurrent episode led to ER evaluation with reassuring MRI - Rx meclizine with benefit - refilled. ?

## 2021-05-09 NOTE — Patient Instructions (Addendum)
Labs today  ?We will refer you back to [facility] for diabetes evaluation.  ?If interested, check with pharmacy about new 2 shot shingles series (shingrix).  ?Return in 3 months for diabetes follow up visit.  ? ?Health Maintenance After Age 81 ?After age 39, you are at a higher risk for certain long-term diseases and infections as well as injuries from falls. Falls are a major cause of broken bones and head injuries in people who are older than age 44. Getting regular preventive care can help to keep you healthy and well. Preventive care includes getting regular testing and making lifestyle changes as recommended by your health care provider. Talk with your health care provider about: ?Which screenings and tests you should have. A screening is a test that checks for a disease when you have no symptoms. ?A diet and exercise plan that is right for you. ?What should I know about screenings and tests to prevent falls? ?Screening and testing are the best ways to find a health problem early. Early diagnosis and treatment give you the best chance of managing medical conditions that are common after age 69. Certain conditions and lifestyle choices may make you more likely to have a fall. Your health care provider may recommend: ?Regular vision checks. Poor vision and conditions such as cataracts can make you more likely to have a fall. If you wear glasses, make sure to get your prescription updated if your vision changes. ?Medicine review. Work with your health care provider to regularly review all of the medicines you are taking, including over-the-counter medicines. Ask your health care provider about any side effects that may make you more likely to have a fall. Tell your health care provider if any medicines that you take make you feel dizzy or sleepy. ?Strength and balance checks. Your health care provider may recommend certain tests to check your strength and balance while standing, walking, or changing  positions. ?Foot health exam. Foot pain and numbness, as well as not wearing proper footwear, can make you more likely to have a fall. ?Screenings, including: ?Osteoporosis screening. Osteoporosis is a condition that causes the bones to get weaker and break more easily. ?Blood pressure screening. Blood pressure changes and medicines to control blood pressure can make you feel dizzy. ?Depression screening. You may be more likely to have a fall if you have a fear of falling, feel depressed, or feel unable to do activities that you used to do. ?Alcohol use screening. Using too much alcohol can affect your balance and may make you more likely to have a fall. ?Follow these instructions at home: ?Lifestyle ?Do not drink alcohol if: ?Your health care provider tells you not to drink. ?If you drink alcohol: ?Limit how much you have to: ?0-1 drink a day for women. ?0-2 drinks a day for men. ?Know how much alcohol is in your drink. In the [place] one drink equals one 12 oz bottle of beer (355 mL), one 5 oz glass of wine (148 mL), or one 1? oz glass of hard liquor (44 mL). ?Do not use any products that contain nicotine or tobacco. These products include cigarettes, chewing tobacco, and vaping devices, such as e-cigarettes. If you need help quitting, ask your health care provider. ?Activity ? ?Follow a regular exercise program to stay fit. This will help you maintain your balance. Ask your health care provider what types of exercise are appropriate for you. ?If you need a cane or walker, use it as recommended by your health  care provider. ?Wear supportive shoes that have nonskid soles. ?Safety ? ?Remove any tripping hazards, such as rugs, cords, and clutter. ?Install safety equipment such as grab bars in bathrooms and safety rails on stairs. ?Keep rooms and walkways well-lit. ?General instructions ?Talk with your health care provider about your risks for falling. Tell your health care provider if: ?You fall. Be sure to tell your  health care provider about all falls, even ones that seem minor. ?You feel dizzy, tiredness (fatigue), or off-balance. ?Take over-the-counter and prescription medicines only as told by your health care provider. These include supplements. ?Eat a healthy diet and maintain a healthy weight. A healthy diet includes low-fat dairy products, low-fat (lean) meats, and fiber from whole grains, beans, and lots of fruits and vegetables. ?Stay current with your vaccines. ?Schedule regular health, dental, and eye exams. ?Summary ?Having a healthy lifestyle and getting preventive care can help to protect your health and wellness after age 76. ?Screening and testing are the best way to find a health problem early and help you avoid having a fall. Early diagnosis and treatment give you the best chance for managing medical conditions that are more common for people who are older than age 47. ?Falls are a major cause of broken bones and head injuries in people who are older than age 74. Take precautions to prevent a fall at home. ?Work with your health care provider to learn what changes you can make to improve your health and wellness and to prevent falls. ?This information is not intended to replace advice given to you by your health care provider. Make sure you discuss any questions you have with your health care provider. ?Document Revised: 07/10/2020 Document Reviewed: 07/10/2020 ?Elsevier Patient Education ? [place]. ? ?

## 2021-05-09 NOTE — Assessment & Plan Note (Signed)
Update carotid US.  

## 2021-05-09 NOTE — Assessment & Plan Note (Signed)
Chronic brittle diabetes with EPI, wide fluctuation in cbg readings, endorses wide fluctuation in response to insulin. I will refer him back to endocrinology for assistance and recommendations on best insulin regimen for him.  ?

## 2021-05-09 NOTE — Assessment & Plan Note (Addendum)
Continues atorvastatin and zetia. Update FLP. ?The ASCVD Risk score (Arnett DK, et al., 2019) failed to calculate for the following reasons: ?  The 2019 ASCVD risk score is only valid for ages 57 to 66  ?

## 2021-05-09 NOTE — Assessment & Plan Note (Signed)
Appreciate GI care - he notes benefit when he takes Creon.  ?

## 2021-05-09 NOTE — Assessment & Plan Note (Signed)
Appreciate onc care.  

## 2021-05-09 NOTE — Assessment & Plan Note (Signed)
Preventative protocols reviewed and updated unless pt declined. Discussed healthy diet and lifestyle.  

## 2021-05-09 NOTE — Assessment & Plan Note (Signed)
Latest Hgb last week 11 at hospital - overall stable period.  ?

## 2021-05-09 NOTE — Telephone Encounter (Signed)
Please call patient - potassium returned very high. Recommend he stop lisinopril, drink 2 8oz glasses of water now, increase water intake daily by 1-2 glasses as well, and return for rpt K level tomorrow which I have ordered.  ?If any palpitations or chest pain, recommend he go to ER.  ?

## 2021-05-09 NOTE — Assessment & Plan Note (Addendum)
Update renal panel. He has seen nephrology (most recently Dr Hollie Salk at [facility]).  ?Baseline Creatinine ~1.8 ?

## 2021-05-09 NOTE — Telephone Encounter (Signed)
[facility] called a critical, K+ 6.6, results given to Dr Danise Mina ?

## 2021-05-09 NOTE — Telephone Encounter (Signed)
Spoke with pt relaying Dr. Synthia Innocent message.  Pt verbalizes understanding and will come by for labs tomorrow at 10:00.  ?

## 2021-05-09 NOTE — Assessment & Plan Note (Signed)
Update thyroid levels on levothyroxine replacement.  ?

## 2021-05-09 NOTE — Progress Notes (Signed)
Patient ID: Scott [name], male    DOB: [birth year], 81 y.o.   MRN: [id number]  This visit was conducted in person.  BP 120/80    Pulse 62    Temp (!) 97.4 F (36.3 C) (Temporal)    Ht 5' 9.5" (1.765 m)    Wt 149 lb 5 oz (67.7 kg)    SpO2 98%    BMI 21.73 kg/m    CC: AMW Subjective:   HPI: Scott [name] is a 81 y.o. male presenting on 05/09/2021 for Medicare Wellness    Did not see health advisor this year   Hearing Screening   '500Hz'  '1000Hz'  '2000Hz'  '4000Hz'   Right ear 40 0 0 0  Left ear 40 0 0 0  Comments: Wears bilateral hearing aids.  Not wearing at today's OV.   Vision Screening - Comments:: Last eye exam, 03/2021.  [place] Visit from 05/09/2021 in [place] at [facility]  PHQ-2 Total Score 0       Fall Risk  05/09/2021 04/11/2020 02/22/2019 12/25/2018 12/08/2018  Falls in the past year? 0 0 1 0 0  Comment - - tripped out of shower - -  Number falls in past yr: - 0 0 0 -  Comment - - - - -  Injury with Fall? - 0 0 0 -  Risk for fall due to : - Medication side effect Medication side effect - -  Follow up - Falls evaluation completed;Falls prevention discussed Falls evaluation completed;Falls prevention discussed - -    CLL with 11q ATM gene mutation - followed by onc [name]). S/p Rituxan + Treanda treatment 01/2019, in partial remission.   Episode of dizziness with vomiting last week - seen at ER, treated with meclizine. Dx peripheral vertigo. MRI brain reassuring.    Postprandial abdominal pain for several months associated with weight loss in known h/o diffuse pancreatic atrophy on CT 08/2017, saw GI ([facility]) - trial creon with benefit. Diagnosed with severe EPI. Completed omeprazole 87m daily course. Fecal elastase levels <50. Concern hyperglycemia contributing to weight loss. Planned f/u 09/2021. Now maintaining weight.   Insulin dependent T2DM - uses CGM to monitor sugars. Current regimen includes novolog 3-4u with meals (2 a day), levemir 14u QAM.  [facility] did not have good experience due to long wait time. He worries about hypoglycemia if his sugars are <200 at bedtime. He notes 5u novolog doesn't seem to have much effect but then if he takes [employer]he notes significant drop in sugar. He's self titrated levemir down to 8-12u again due to concern over hypoglycemia. He states when he took levemir 14u it dropped his sugar down to 60s. No other hypoglycemic sugars.   Preventative: ESOPHAGOGASTRODUODENOSCOPY (EGD) WITH PROPOFOL 11/17/2017 - healing erosive gastritis, intestinal metaplasia, neg H pylori ([facility], RTally Due MD) COLONOSCOPY WITH PROPOFOL 11/17/2017 - TA, HP, ([facility], RTally Due MD)  Prostate screening - normal DRE and PSA 2015 - decided to age out. Lung cancer screen - not eligible  Flu shot yearly COVID vaccine Pfizer 04/2019, 05/2019, booster 12/2019, bivalent 12/2020 Pneumovax 2013. Prevnar-13 07/2013 Tdap 08/2017 zostavax 2015 shingrix - discussed   Advanced directives - previously wanted full code but with wife's death experience, likely would change decision. Has updated living will with lawyer - will bring [place]. HCPOA likely 2 sons.  Seat belt use discussed.  Sunscreen use discussed. No changing moles on skin. sees derm.  Ex smoker Alcohol use - none  Dentist - has dentures, new ones  Eye exam yearly  Bowel - no constipation - takes stool softener PRN Bladder - no incontinence    Lives alone - widower 05/19/2015, dog also passed away Occupation: retired, was route Hotel manager Edu: 11th grade Activity: some treadmill  Diet: drinks diet coke, no vegetables     Relevant past medical, surgical, family and social history reviewed and updated as indicated. Interim medical history since our last visit reviewed. Allergies and medications reviewed and updated. Outpatient Medications Prior to Visit  Medication Sig Dispense Refill   acetaminophen (TYLENOL) 650 MG CR tablet Take 1,300 mg by mouth 2 (two) times  daily.     amLODipine (NORVASC) 5 MG tablet Take 1 tablet by mouth once daily 90 tablet 1   atorvastatin (LIPITOR) 80 MG tablet Take 1 tablet (80 mg total) by mouth daily. 39 tablet 0   carvedilol (COREG) 6.25 MG tablet TAKE 1 TABLET BY MOUTH TWICE DAILY WITH MEALS 180 tablet 0   Continuous Blood Gluc Receiver (FREESTYLE LIBRE 14 DAY READER) DEVI Use as directed to check sugars QID AC HS 2 each 1   Continuous Blood Gluc Sensor (FREESTYLE LIBRE 14 DAY SENSOR) MISC Use as directed to check sugars QID HS 2 each 11   ezetimibe (ZETIA) 10 MG tablet Take 1 tablet by mouth once daily 90 tablet 0   insulin detemir (LEVEMIR FLEXTOUCH) 100 UNIT/ML FlexPen Inject 14 Units into the skin daily. 1 mL 0   levothyroxine (SYNTHROID) 88 MCG tablet Take 1 tablet by mouth once daily 30 tablet 0   lipase/protease/amylase (CREON) 36000 UNITS CPEP capsule Take 2 capsules with the first bite of each meal and 1 capsule with the first bite of each snack 240 capsule 2   lisinopril (ZESTRIL) 5 MG tablet Take 1 tablet (5 mg total) by mouth at bedtime. 90 tablet 0   nitroGLYCERIN (NITROSTAT) 0.4 MG SL tablet Place 1 tablet (0.4 mg total) under the tongue every 5 (five) minutes as needed for chest pain ((MAX of 3 doses)). 25 tablet 0   NOVOLOG FLEXPEN 100 UNIT/ML FlexPen Inject 3-4 Units into the skin 3 (three) times daily with meals. 15 mL 0   oxymetazoline (AFRIN) 0.05 % nasal spray Place 1 spray into left nostril daily as needed (nose bleeds).     traZODone (DESYREL) 100 MG tablet TAKE 1 TABLET BY MOUTH AT BEDTIME AS NEEDED FOR SLEEP 90 tablet 0   triamcinolone ointment (KENALOG) 0.5 % Apply 1 application topically 2 (two) times daily. 30 g 1   meclizine (ANTIVERT) 12.5 MG tablet Take 1 tablet (12.5 mg total) by mouth 3 (three) times daily as needed for dizziness. 12 tablet 0   hydroxyurea (HYDREA) 500 MG capsule Take 1,000 mg by mouth daily. (Patient not taking: Reported on 04/28/2021)     azithromycin (ZITHROMAX) 250 MG  tablet Take by mouth. (Patient not taking: Reported on 04/28/2021)     benzonatate (TESSALON) 200 MG capsule Take 200 mg by mouth 3 (three) times daily as needed. (Patient not taking: Reported on 04/28/2021)     dexamethasone (DECADRON) 2 MG tablet Take by mouth. (Patient not taking: Reported on 07/10/3265)     folic acid (FOLVITE) 1 MG tablet Take 2 mg by mouth daily. (Patient not taking: Reported on 04/28/2021)     Facility-Administered Medications Prior to Visit  Medication Dose Route Frequency Provider Last Rate Last Admin   sodium chloride flush (NS) 0.9 % injection 10 mL  10 mL Intravenous  Once Lloyd Huger, MD         Per HPI unless specifically indicated in ROS section below Review of Systems  Constitutional:  Negative for activity change, appetite change, chills, fatigue, fever and unexpected weight change.  HENT:  Negative for hearing loss.   Eyes:  Negative for visual disturbance.  Respiratory:  Negative for cough, chest tightness, shortness of breath and wheezing.   Cardiovascular:  Negative for chest pain, palpitations and leg swelling.  Gastrointestinal:  Negative for abdominal distention, abdominal pain, blood in stool, constipation, diarrhea, nausea and vomiting.  Genitourinary:  Negative for difficulty urinating and hematuria.  Musculoskeletal:  Negative for arthralgias, myalgias and neck pain.  Skin:  Negative for rash.  Neurological:  Positive for dizziness. Negative for seizures, syncope and headaches.  Hematological:  Negative for adenopathy. Does not bruise/bleed easily.  Psychiatric/Behavioral:  Negative for dysphoric mood. The patient is not nervous/anxious.    Objective:  BP 120/80    Pulse 62    Temp (!) 97.4 F (36.3 C) (Temporal)    Ht 5' 9.5" (1.765 m)    Wt 149 lb 5 oz (67.7 kg)    SpO2 98%    BMI 21.73 kg/m   Wt Readings from Last 3 Encounters:  05/09/21 149 lb 5 oz (67.7 kg)  04/28/21 148 lb (67.1 kg)  04/03/21 148 lb 8 oz (67.4 kg)      Physical  Exam Vitals and nursing note reviewed.  Constitutional:      General: He is not in acute distress.    Appearance: Normal appearance. He is well-developed. He is not ill-appearing.  HENT:     Head: Normocephalic and atraumatic.     Right Ear: Hearing, tympanic membrane, ear canal and external ear normal.     Left Ear: Hearing, tympanic membrane, ear canal and external ear normal.  Eyes:     General: No scleral icterus.    Extraocular Movements: Extraocular movements intact.     Conjunctiva/sclera: Conjunctivae normal.     Pupils: Pupils are equal, round, and reactive to light.  Neck:     Thyroid: No thyroid mass or thyromegaly.     Vascular: No carotid bruit.  Cardiovascular:     Rate and Rhythm: Normal rate and regular rhythm.     Pulses: Normal pulses.          Radial pulses are 2+ on the right side and 2+ on the left side.     Heart sounds: Normal heart sounds. No murmur heard. Pulmonary:     Effort: Pulmonary effort is normal. No respiratory distress.     Breath sounds: Normal breath sounds. No wheezing, rhonchi or rales.  Abdominal:     General: Bowel sounds are normal. There is no distension.     Palpations: Abdomen is soft. There is no mass.     Tenderness: There is no abdominal tenderness. There is no guarding or rebound.     Hernia: No hernia is present.  Musculoskeletal:        General: Normal range of motion.     Cervical back: Normal range of motion and neck supple.     Right lower leg: No edema.     Left lower leg: No edema.  Lymphadenopathy:     Cervical: No cervical adenopathy.  Skin:    General: Skin is warm and dry.     Findings: No rash.  Neurological:     General: No focal deficit present.     Mental  Status: He is alert and oriented to person, place, and time.     Comments:  Recall 3/3 Calculation 5/5 serial 3s  Not a good speller  Psychiatric:        Mood and Affect: Mood normal.        Behavior: Behavior normal.        Thought Content: Thought  content normal.        Judgment: Judgment normal.      Results for orders placed or performed during the hospital encounter of 04/28/21  CBC with Differential/Platelet  Result Value Ref Range   WBC 7.1 4.0 - 10.5 K/uL   RBC 3.80 (L) 4.22 - 5.81 MIL/uL   Hemoglobin 11.3 (L) 13.0 - 17.0 g/dL   HCT 35.8 (L) 39.0 - 52.0 %   MCV 94.2 80.0 - 100.0 fL   MCH 29.7 26.0 - 34.0 pg   MCHC 31.6 30.0 - 36.0 g/dL   RDW 12.7 11.5 - 15.5 %   Platelets 120 (L) 150 - 400 K/uL   nRBC 0.0 0.0 - 0.2 %   Neutrophils Relative % 82 %   Neutro Abs 5.8 1.7 - 7.7 K/uL   Lymphocytes Relative 13 %   Lymphs Abs 0.9 0.7 - 4.0 K/uL   Monocytes Relative 5 %   Monocytes Absolute 0.4 0.1 - 1.0 K/uL   Eosinophils Relative 0 %   Eosinophils Absolute 0.0 0.0 - 0.5 K/uL   Basophils Relative 0 %   Basophils Absolute 0.0 0.0 - 0.1 K/uL   Immature Granulocytes 0 %   Abs Immature Granulocytes 0.03 0.00 - 0.07 K/uL  Magnesium  Result Value Ref Range   Magnesium 1.8 1.7 - 2.4 mg/dL  Comprehensive metabolic panel  Result Value Ref Range   Sodium 140 135 - 145 mmol/L   Potassium 4.1 3.5 - 5.1 mmol/L   Chloride 104 98 - 111 mmol/L   CO2 24 22 - 32 mmol/L   Glucose, Bld 101 (H) 70 - 99 mg/dL   BUN 57 (H) 8 - 23 mg/dL   Creatinine, Ser 1.74 (H) 0.61 - 1.24 mg/dL   Calcium 9.6 8.9 - 10.3 mg/dL   Total Protein 6.9 6.5 - 8.1 g/dL   Albumin 4.1 3.5 - 5.0 g/dL   AST 34 15 - 41 U/L   ALT 42 0 - 44 U/L   Alkaline Phosphatase 89 38 - 126 U/L   Total Bilirubin 0.7 0.3 - 1.2 mg/dL   GFR, Estimated 39 (L) >60 mL/min   Anion gap 12 5 - 15  Urinalysis, Routine w reflex microscopic  Result Value Ref Range   Color, Urine STRAW (A) YELLOW   APPearance CLEAR (A) CLEAR   Specific Gravity, Urine 1.010 1.005 - 1.030   pH 5.0 5.0 - 8.0   Glucose, UA NEGATIVE NEGATIVE mg/dL   Hgb urine dipstick NEGATIVE NEGATIVE   Bilirubin Urine NEGATIVE NEGATIVE   Ketones, ur 5 (A) NEGATIVE mg/dL   Protein, ur 30 (A) NEGATIVE mg/dL   Nitrite  NEGATIVE NEGATIVE   Leukocytes,Ua NEGATIVE NEGATIVE   RBC / HPF 0-5 0 - 5 RBC/hpf   WBC, UA 0-5 0 - 5 WBC/hpf   Bacteria, UA NONE SEEN NONE SEEN   Squamous Epithelial / LPF 0-5 0 - 5   Mucus PRESENT   CBG monitoring, ED  Result Value Ref Range   Glucose-Capillary 111 (H) 70 - 99 mg/dL  Sample to Blood Bank  Result Value Ref Range   Blood Bank Specimen SAMPLE  AVAILABLE FOR TESTING    Sample Expiration      05/01/2021,2359 Performed at [facility], [place], [place] [address]   Troponin I (High Sensitivity)  Result Value Ref Range   Troponin I (High Sensitivity) 10 <18 ng/L  Troponin I (High Sensitivity)  Result Value Ref Range   Troponin I (High Sensitivity) 11 <18 ng/L   Lab Results  Component Value Date   CHOL 179 04/12/2020   HDL 40.40 04/12/2020   LDLCALC 99 04/12/2020   LDLDIRECT 84 09/04/2011   TRIG 198.0 (H) 04/12/2020   CHOLHDL 4 04/12/2020    Lab Results  Component Value Date   HGBA1C 8.0 (A) 12/18/2020    Lab Results  Component Value Date   TSH 1.80 04/12/2020    Assessment & Plan:  This visit occurred during the SARS-CoV-2 public health emergency.  Safety protocols were in place, including screening questions prior to the visit, additional usage of staff PPE, and extensive cleaning of exam room while observing appropriate contact time as indicated for disinfecting solutions.   Problem List Items Addressed This Visit     Encounter for general adult medical examination with abnormal findings - Primary (Chronic)    Preventative protocols reviewed and updated unless pt declined. Discussed healthy diet and lifestyle.       Hypertension    Chronic, stable. Continue current regimen.       Hypothyroidism    Update thyroid levels on levothyroxine replacement.       Anemia in CKD (chronic kidney disease)    Latest Hgb last week 11 at hospital - overall stable period.       Hyperlipidemia associated with type 2 diabetes mellitus  (HCC)    Continues atorvastatin and zetia. Update FLP. The ASCVD Risk score (Arnett DK, et al., 2019) failed to calculate for the following reasons:   The 2019 ASCVD risk score is only valid for ages 69 to 7       Relevant Orders   Lipid panel   Hemoglobin A1c   CKD (chronic kidney disease) stage 3, GFR 30-59 ml/min (HCC)    Update renal panel. He has seen nephrology (most recently Dr Hollie Salk at [facility]).  Baseline Creatinine ~1.8      Relevant Orders   Renal function panel   VITAMIN D 25 Hydroxy (Vit-D Deficiency, Fractures)   Parathyroid hormone, intact (no Ca)   Type 2 diabetes mellitus with diabetic chronic kidney disease (HCC)    Chronic brittle diabetes with EPI, wide fluctuation in cbg readings, endorses wide fluctuation in response to insulin. I will refer him back to endocrinology for assistance and recommendations on best insulin regimen for him.       Relevant Orders   Ambulatory referral to Endocrinology   BPV (benign positional vertigo)    Recurrent episode led to ER evaluation with reassuring MRI - Rx meclizine with benefit - refilled.      CLL (chronic lymphocytic leukemia) ([place])    Appreciate onc care.       Relevant Medications   meclizine (ANTIVERT) 12.5 MG tablet   Carotid stenosis, asymptomatic, bilateral    Update carotid US.       Relevant Orders   VAS US CAROTID   Hearing loss    Not wearing hearing aides today.       Exocrine pancreatic insufficiency    Appreciate GI care - he notes benefit when he takes Creon.       Relevant Orders  Ambulatory referral to Endocrinology     Meds ordered this encounter  Medications   meclizine (ANTIVERT) 12.5 MG tablet    Sig: Take 1 tablet (12.5 mg total) by mouth 2 (two) times daily as needed for dizziness (sedation precautions).    Dispense:  20 tablet    Refill:  0   Orders Placed This Encounter  Procedures   Lipid panel   Hemoglobin A1c   Renal function panel   VITAMIN D 25  Hydroxy (Vit-D Deficiency, Fractures)   Parathyroid hormone, intact (no Ca)   Ambulatory referral to Endocrinology    Referral Priority:   Routine    Referral Type:   Consultation    Referral Reason:   Specialty Services Required    Number of Visits Requested:   1     Patient instructions: Labs today  We will refer [facility] back to [facility] for diabetes evaluation.  If interested, check with pharmacy about new 2 shot shingles series (shingrix).  Return in 3 months for diabetes follow up visit.   Follow up plan: Return in about 3 months (around 08/09/2021) for follow up visit.  [name], MD

## 2021-05-09 NOTE — Assessment & Plan Note (Signed)
Chronic, stable. Continue current regimen. 

## 2021-05-09 NOTE — Assessment & Plan Note (Signed)
Not wearing hearing aides today.  ?

## 2021-05-10 ENCOUNTER — Other Ambulatory Visit (INDEPENDENT_AMBULATORY_CARE_PROVIDER_SITE_OTHER): Payer: Medicare Other

## 2021-05-10 DIAGNOSIS — E875 Hyperkalemia: Secondary | ICD-10-CM | POA: Diagnosis not present

## 2021-05-10 LAB — PARATHYROID HORMONE, INTACT (NO CA): PTH: 40 pg/mL (ref 16–77)

## 2021-05-10 LAB — POTASSIUM: Potassium: 5.6 mEq/L — ABNORMAL HIGH (ref 3.5–5.1)

## 2021-05-11 ENCOUNTER — Other Ambulatory Visit: Payer: Self-pay | Admitting: Family Medicine

## 2021-05-11 DIAGNOSIS — E875 Hyperkalemia: Secondary | ICD-10-CM

## 2021-05-11 NOTE — Progress Notes (Unsigned)
[place]  Telephone:([phone] Fax:(336) [phone]  ID: [name] OB: [birth year]  MR#: [id number]  EXH#:[id number]  Patient Care Team: [name], MD as PCP - General (Family Medicine) Dorothy Spark, MD as PCP - Cardiology (Cardiology) Lloyd Huger, MD as Consulting Physician (Oncology) Clent Jacks, MD as Consulting Physician (Ophthalmology) Dorothy Spark, MD as Consulting Physician (Cardiology) Madelon Lips, MD as Consulting Physician (Nephrology)   CHIEF COMPLAINT: 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  INTERVAL HISTORY: Patient returns to clinic today for repeat laboratory work and routine 45-monthevaluation.  He continues to feel well and remains asymptomatic.  He denies any weakness or fatigue and remains active.  He denies any recent fevers or illnesses.  He has no neurologic complaints.  He has a good appetite and denies weight loss. He denies any night sweats. He has noted no lymphadenopathy.  He has no chest pain, shortness of breath, cough, or hemoptysis.  He denies any nausea, vomiting, constipation, or diarrhea.  He denies any melena or hematochezia.  He has no urinary complaints.  Patient feels at his baseline offers no specific complaints today.  REVIEW OF SYSTEMS:   Review of Systems  Constitutional: Negative.  Negative for fever, malaise/fatigue and weight loss.  HENT: Negative.  Negative for nosebleeds.   Respiratory: Negative.  Negative for cough and shortness of breath.   Cardiovascular: Negative.  Negative for chest pain and leg swelling.  Gastrointestinal: Negative.  Negative for abdominal pain, blood in stool and melena.  Genitourinary: Negative.  Negative for dysuria and hematuria.  Musculoskeletal: Negative.  Negative for back pain and neck pain.  Skin: Negative.  Negative for itching and rash.  Neurological: Negative.  Negative for dizziness, sensory change, focal weakness, weakness and headaches.   Endo/Heme/Allergies: Negative.  Does not bruise/bleed easily.  Psychiatric/Behavioral: Negative.  The patient is not nervous/anxious.    As per HPI. Otherwise, a complete review of systems is negative.  PAST MEDICAL HISTORY: Past Medical History:  Diagnosis Date   Acquired hand deformity 1962   hand saw accident at work   Anemia 10/11/2011   Arthritis    Bradycardia 10/10/2011   CAD (coronary artery disease) 10/10/2011   MI s/p PTCA (Dx-OM2 proximal concentric stenosis)   Carotid artery disease (HCC)    Cellulitis of buttock, left 11/17/2018   Chronic edema    CKD (chronic kidney disease) stage 3, GFR 30-59 ml/min (HCC)    [name]   CLL (chronic lymphocytic leukemia) ([facility]    Colon polyps    Cough 04/28/2019   Dysphagia    EGD - reflux esophagitis, one area of stomach with focal intestinal metaplasia (06/2019) Dr VMarius Ditch  Generalized headaches    frequent   GI bleed 07/08/2017   Glaucoma    s/p laser surgery   HLD (hyperlipidemia)    Hyperkalemia 02/28/2018   Hypertension    Hypothyroidism 10/10/2011   Iron deficiency anemia due to chronic blood loss    Squamous cell carcinoma of skin 06/15/2019   Right posterior ear. SCCis, hypertrophic, crusted   Thrombocytopenia ([facility] 10/11/2011   Type 2 diabetes with nephropathy ([facility]    DM refresher course [facility] (04/2013)    PAST SURGICAL HISTORY: Past Surgical History:  Procedure Laterality Date   BACK SURGERY     cervical neck   CATARACT EXTRACTION, BILATERAL Bilateral 2017   COLONOSCOPY  11/2008   1 polyp, diverticulosis, rec rpt 5 yrs (Dr. EAlferd Apa   COLONOSCOPY  06/2014   hyperplastic polyp, rpt 5 yrs Oletta Lamas)   COLONOSCOPY WITH PROPOFOL N/A 11/17/2017   TA, HP, (Vanga, Tally Due, MD)   ESOPHAGOGASTRODUODENOSCOPY (EGD) WITH PROPOFOL N/A 11/17/2017   healing erosive gastritis, intestinal metaplasia, neg H pylori (Vanga, Tally Due, MD)   ESOPHAGOGASTRODUODENOSCOPY (EGD) WITH PROPOFOL N/A 06/28/2019   erosive gastropathy  with stigmata of recent bleed, granular tissue in esophagus biopsied- reflux esophagitis, small HH (Vanga, Tally Due, MD)   EYE SURGERY  2012   laser surgery for glaucoma   PORTACATH PLACEMENT Left 12/09/2018   Procedure: INSERTION PORT-A-CATH;  Surgeon: [name], MD;  Location: [facility] ORS;  Service: General;  Laterality: Left;   PTCA  1994, 1995   US ECHOCARDIOGRAPHY  10/2013   inferior wall hypokinesis, mild LVH, EF 50-55%, mild MR and LA dilation    FAMILY HISTORY: Family History  Problem Relation Age of Onset   Diabetes Sister    Stomach cancer Sister 66   Pneumonia Father        caused death   Other Brother        no communication with brother so unsure of any health conditions   Coronary artery disease Son 79       5v CABG and stents   Hyperlipidemia Sister    Stroke Neg Hx    Heart attack Neg Hx     ADVANCED DIRECTIVES (Y/N):  N  HEALTH MAINTENANCE: Social History   Tobacco Use   Smoking status: Former    Types: Cigarettes    Quit date: 03/04/1978    Years since quitting: 43.2   Smokeless tobacco: Never  Vaping Use   Vaping Use: Never used  Substance Use Topics   Alcohol use: No   Drug use: No     Colonoscopy:  PAP:  Bone density:  Lipid panel:  No Known Allergies  Current Outpatient Medications  Medication Sig Dispense Refill   acetaminophen (TYLENOL) 650 MG CR tablet Take 1,300 mg by mouth 2 (two) times daily.     amLODipine (NORVASC) 5 MG tablet Take 1 tablet by mouth once daily 90 tablet 1   atorvastatin (LIPITOR) 80 MG tablet Take 1 tablet (80 mg total) by mouth daily. 39 tablet 0   carvedilol (COREG) 6.25 MG tablet TAKE 1 TABLET BY MOUTH TWICE DAILY WITH MEALS 180 tablet 0   Continuous Blood Gluc Receiver (FREESTYLE LIBRE 14 DAY READER) DEVI Use as directed to check sugars QID AC HS 2 each 1   Continuous Blood Gluc Sensor (FREESTYLE LIBRE 14 DAY SENSOR) MISC Use as directed to check sugars QID HS 2 each 11   ezetimibe (ZETIA) 10 MG tablet Take  1 tablet by mouth once daily 90 tablet 0   hydroxyurea (HYDREA) 500 MG capsule Take 1,000 mg by mouth daily. (Patient not taking: Reported on 04/28/2021)     insulin detemir (LEVEMIR FLEXTOUCH) 100 UNIT/ML FlexPen Inject 14 Units into the skin daily. 1 mL 0   levothyroxine (SYNTHROID) 88 MCG tablet Take 1 tablet by mouth once daily 30 tablet 0   lipase/protease/amylase (CREON) 36000 UNITS CPEP capsule Take 2 capsules with the first bite of each meal and 1 capsule with the first bite of each snack 240 capsule 2   lisinopril (ZESTRIL) 5 MG tablet Take 1 tablet (5 mg total) by mouth at bedtime. 90 tablet 0   meclizine (ANTIVERT) 12.5 MG tablet Take 1 tablet (12.5 mg total) by mouth 2 (two) times daily as needed for dizziness (sedation precautions).  20 tablet 0   nitroGLYCERIN (NITROSTAT) 0.4 MG SL tablet Place 1 tablet (0.4 mg total) under the tongue every 5 (five) minutes as needed for chest pain ((MAX of 3 doses)). 25 tablet 0   NOVOLOG FLEXPEN 100 UNIT/ML FlexPen Inject 3-4 Units into the skin 3 (three) times daily with meals. 15 mL 0   oxymetazoline (AFRIN) 0.05 % nasal spray Place 1 spray into left nostril daily as needed (nose bleeds).     traZODone (DESYREL) 100 MG tablet TAKE 1 TABLET BY MOUTH AT BEDTIME AS NEEDED FOR SLEEP 90 tablet 0   triamcinolone ointment (KENALOG) 0.5 % Apply 1 application topically 2 (two) times daily. 30 g 1   No current facility-administered medications for this visit.   Facility-Administered Medications Ordered in Other Visits  Medication Dose Route Frequency Provider Last Rate Last Admin   sodium chloride flush (NS) 0.9 % injection 10 mL  10 mL Intravenous Once [name], Kathlene November, MD        OBJECTIVE: There were no vitals filed for this visit.    There is no height or weight on file to calculate BMI.    ECOG FS:0 - Asymptomatic  General: Well-developed, well-nourished, no acute distress. Eyes: Pink conjunctiva, anicteric sclera. HEENT: Normocephalic, moist  mucous membranes. Lungs: No audible wheezing or coughing. Heart: Regular rate and rhythm. Abdomen: Soft, nontender, no obvious distention. Musculoskeletal: No edema, cyanosis, or clubbing. Neuro: Alert, answering all questions appropriately. Cranial nerves grossly intact. Skin: No rashes or petechiae noted. Psych: Normal affect.   LAB RESULTS:  Lab Results  Component Value Date   NA 141 05/09/2021   K 5.6 No hemolysis seen (H) 05/10/2021   CL 106 05/09/2021   CO2 29 05/09/2021   GLUCOSE 204 (H) 05/09/2021   BUN 43 (H) 05/09/2021   CREATININE 1.68 (H) 05/09/2021   CALCIUM 10.0 05/09/2021   PROT 6.9 04/28/2021   ALBUMIN 4.4 05/09/2021   AST 34 04/28/2021   ALT 42 04/28/2021   ALKPHOS 89 04/28/2021   BILITOT 0.7 04/28/2021   GFRNONAA 39 (L) 04/28/2021   GFRAA 42 (L) 10/28/2019    Lab Results  Component Value Date   WBC 7.1 04/28/2021   NEUTROABS 5.8 04/28/2021   HGB 11.3 (L) 04/28/2021   HCT 35.8 (L) 04/28/2021   MCV 94.2 04/28/2021   PLT 120 (L) 04/28/2021     STUDIES: MR BRAIN WO CONTRAST  Result Date: 04/28/2021 CLINICAL DATA:  Sudden onset of dizziness and nausea beginning 1 hour prior to arrival. EXAM: MRI HEAD WITHOUT CONTRAST TECHNIQUE: Multiplanar, multiecho pulse sequences of the brain and surrounding structures were obtained without intravenous contrast. COMPARISON:  Head CT 06/11/2019 FINDINGS: Brain: No acute infarction, hemorrhage, hydrocephalus, extra-axial collection or mass lesion. Two small remote right cerebellar infarcts. Cerebral volume loss and mild periventricular chronic small vessel ischemia. Vascular: Normal flow voids Skull and upper cervical spine: Normal marrow signal Sinuses/Orbits: Bilateral cataract resection. Minimal opacification of the left sphenoid sinus. IMPRESSION: 1. No acute finding. 2. Mild chronic small vessel disease. Two small remote right cerebellar infarcts. Electronically Signed   By: Jorje Guild M.D.   On: 04/28/2021 07:44     ONCOLOGY HISTORY: Bone marrow biopsy on Jul 28, 2018 reviewed independently with 73% involvement of CLL.  Previous bone marrow biopsy on October 23, 2017 revealed only 31% involvement.  Previously, peripheral blood FISH testing 50% incidence of mutation in the ATM gene which is commonly associated with deletion 11q. 11q- is associated with an unfavorable  prognosis and high risk of not responding to initial treatment. 13q- is a more common mutation and is actually associated with a more favorable prognosis. Patient had clear progression of disease in his bone marrow along with a worsening transfusion requirement.  He received 5 weekly cycles of Rituxan with mild improvement of his platelet count.  Patient then received 4 cycles of Rituxan plus Treanda completing treatment on January 12, 2019.   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  PLAN:    1. CLL: Chronic and unchanged.  Patient remains in at least a very good partial remission.  No intervention is needed.  Patient does not require additional treatment at this time.  If patient required retreatment, would consider Rituxan plus Treanda once again.  Return to clinic in 3 months with repeat laboratory work and further evaluation.  At which time, patient can likely be transitioned to laboratory work every 3 months and evaluation every 6 months. 2.  Thrombocytopenia: Chronic and unchanged.  Patient's platelet count is 124.  Previously, he received high-dose prednisone, IVIG, and Rituxan with no appreciable durability to improve his platelet count.  Platelets improved upon treatment of his CLL. 3.  Anemia: Chronic and unchanged.  Patient's hemoglobin is 11.2 today.  His last blood transfusion was given on December 03, 2018.  Patient had colonoscopy on November 17, 2017 that removed 2 nonmalignant polyps.  EGD on the same day revealed nonbleeding erosive gastropathy with multiple nonbleeding duodenal ulcers.  Repeat EGD did not reveal  significant pathology.  Continue follow-up with GI as scheduled. 4.  Reaction to Rituxan: Rate-based.  Patient will require premedications for any further infusions with Rituxan.  These have been entered into his treatment plan. 5.  Chronic renal insufficiency: Patient's most recent creatinine was slightly worse at 1.75.  Continue monitoring and treatment per primary care. 6.  Hyperkalemia: Mild, monitor.  Patient expressed understanding and was in agreement with this plan. He also understands that He can call clinic at any time with any questions, concerns, or complaints.    Lloyd Huger, MD   05/11/2021 9:27 AM

## 2021-05-13 ENCOUNTER — Encounter: Payer: Self-pay | Admitting: Oncology

## 2021-05-14 ENCOUNTER — Other Ambulatory Visit (INDEPENDENT_AMBULATORY_CARE_PROVIDER_SITE_OTHER): Payer: Medicare Other

## 2021-05-14 DIAGNOSIS — E875 Hyperkalemia: Secondary | ICD-10-CM | POA: Diagnosis not present

## 2021-05-14 LAB — BASIC METABOLIC PANEL
BUN: 40 mg/dL — ABNORMAL HIGH (ref 6–23)
CO2: 28 mEq/L (ref 19–32)
Calcium: 9.5 mg/dL (ref 8.4–10.5)
Chloride: 105 mEq/L (ref 96–112)
Creatinine, Ser: 1.61 mg/dL — ABNORMAL HIGH (ref 0.40–1.50)
GFR: 40.02 mL/min — ABNORMAL LOW (ref >60.00)
Glucose, Bld: 189 mg/dL — ABNORMAL HIGH (ref 70–99)
Potassium: 4.7 mEq/L (ref 3.5–5.1)
Sodium: 140 mEq/L (ref 135–145)

## 2021-05-15 ENCOUNTER — Encounter: Payer: Self-pay | Admitting: Oncology

## 2021-05-16 ENCOUNTER — Encounter: Payer: Self-pay | Admitting: Family Medicine

## 2021-05-17 ENCOUNTER — Encounter: Payer: Self-pay | Admitting: Oncology

## 2021-05-17 ENCOUNTER — Telehealth: Payer: Self-pay | Admitting: Family Medicine

## 2021-05-17 ENCOUNTER — Inpatient Hospital Stay: Payer: Medicare Other | Attending: Oncology

## 2021-05-17 ENCOUNTER — Other Ambulatory Visit: Payer: Self-pay

## 2021-05-17 ENCOUNTER — Inpatient Hospital Stay (HOSPITAL_BASED_OUTPATIENT_CLINIC_OR_DEPARTMENT_OTHER): Payer: Medicare Other | Admitting: Oncology

## 2021-05-17 VITALS — BP 129/65 | HR 65 | Wt 148.1 lb

## 2021-05-17 DIAGNOSIS — D696 Thrombocytopenia, unspecified: Secondary | ICD-10-CM | POA: Diagnosis not present

## 2021-05-17 DIAGNOSIS — I129 Hypertensive chronic kidney disease with stage 1 through stage 4 chronic kidney disease, or unspecified chronic kidney disease: Secondary | ICD-10-CM | POA: Diagnosis not present

## 2021-05-17 DIAGNOSIS — C9110 Chronic lymphocytic leukemia of B-cell type not having achieved remission: Secondary | ICD-10-CM

## 2021-05-17 DIAGNOSIS — Z87891 Personal history of nicotine dependence: Secondary | ICD-10-CM | POA: Diagnosis not present

## 2021-05-17 DIAGNOSIS — D649 Anemia, unspecified: Secondary | ICD-10-CM | POA: Diagnosis not present

## 2021-05-17 DIAGNOSIS — K8681 Exocrine pancreatic insufficiency: Secondary | ICD-10-CM

## 2021-05-17 DIAGNOSIS — E1122 Type 2 diabetes mellitus with diabetic chronic kidney disease: Secondary | ICD-10-CM | POA: Insufficient documentation

## 2021-05-17 LAB — CBC WITH DIFFERENTIAL/PLATELET
Abs Immature Granulocytes: 0.02 10*3/uL (ref 0.00–0.07)
Basophils Absolute: 0 10*3/uL (ref 0.0–0.1)
Basophils Relative: 1 %
Eosinophils Absolute: 0.1 10*3/uL (ref 0.0–0.5)
Eosinophils Relative: 2 %
HCT: 36.5 % — ABNORMAL LOW (ref 39.0–52.0)
Hemoglobin: 11.6 g/dL — ABNORMAL LOW (ref 13.0–17.0)
Immature Granulocytes: 0 %
Lymphocytes Relative: 18 %
Lymphs Abs: 1.1 10*3/uL (ref 0.7–4.0)
MCH: 30 pg (ref 26.0–34.0)
MCHC: 31.8 g/dL (ref 30.0–36.0)
MCV: 94.3 fL (ref 80.0–100.0)
Monocytes Absolute: 0.6 10*3/uL (ref 0.1–1.0)
Monocytes Relative: 10 %
Neutro Abs: 4 10*3/uL (ref 1.7–7.7)
Neutrophils Relative %: 69 %
Platelets: 124 10*3/uL — ABNORMAL LOW (ref 150–400)
RBC: 3.87 MIL/uL — ABNORMAL LOW (ref 4.22–5.81)
RDW: 12.9 % (ref 11.5–15.5)
WBC: 5.8 10*3/uL (ref 4.0–10.5)
nRBC: 0 % (ref 0.0–0.2)

## 2021-05-17 LAB — SAMPLE TO BLOOD BANK

## 2021-05-17 NOTE — Telephone Encounter (Signed)
Nikki at Dr Gary Fleet office called stating that pt referral at [facility] is not taking new pt. [place] is asking if Dr Darnell Level can refer pt some where else. Please advise.  ?

## 2021-05-17 NOTE — Progress Notes (Signed)
Pt is having trouble with his sugar level and is waiting for a referral to [facility]. [facility] is not accepting new pts. I am sending a message to PCP to inform and ask for recommendations. ? ?

## 2021-05-18 NOTE — Addendum Note (Signed)
Addended by: [name] on: 05/18/2021 04:24 PM ? ? Modules accepted: Orders ? ?

## 2021-05-18 NOTE — Telephone Encounter (Signed)
They said that as long as they have been seen within 3 years that they can still be seen and would not be considered a new patient.  ?Patient last OV 05/2020, all he has to do is call to schedule a follow up visit.  ? ?No referrals are needed for follow up visits, only for NP appts ?

## 2021-05-18 NOTE — Telephone Encounter (Signed)
Thank you for checking. Lattie Haw can you call pt to let him know this?  ?[facility] # is [phone] ?

## 2021-05-18 NOTE — Telephone Encounter (Signed)
Only other option that I know of is to go to [facility].  ?I have placed new referral.  ?

## 2021-05-18 NOTE — Telephone Encounter (Signed)
I would image [facility] would see this patient as he is an established patient and last saw Eleonore Chiquito NP 05/04/2020.  ?Can we reach out to [place]? I did want him to see an MD at least for initial visit.  ?Fyi to [place] ?

## 2021-05-18 NOTE — Telephone Encounter (Signed)
Spoke with pt relaying Dr. G's message.  Pt verbalizes understanding and expresses his thanks.  

## 2021-05-18 NOTE — Telephone Encounter (Signed)
Spoke with pt relaying Ashtyn's message.  Pt verbalizes understanding but is requesting to go to a different endo office.  States he waited for 2 hrs to be seen, then was in with [facility] for only 5 mins and was told to, "Keep doing what you're doing."  Pt says he was dissatisfied with "the whole thing". ?

## 2021-05-19 NOTE — Telephone Encounter (Signed)
Referral sent to [facility].  ?Sent to [employer]. ?They will review and call the patient or the patient can contact them.  ? ?[facility] ?Address: [address] ?[place], [place] [address] ?Phone: [phone] ? ? ? ? ?Sending to Endo to f/u on referral.  ?Larene [place], Can you please take a look at this referral? ? ?Thanks! ?-Varney Daily, CMA ?Referral Coordinator ? ?

## 2021-05-21 ENCOUNTER — Telehealth: Payer: Self-pay | Admitting: Family Medicine

## 2021-05-21 NOTE — Telephone Encounter (Signed)
Referral has been sent to provider for review. ?

## 2021-05-21 NOTE — Chronic Care Management (AMB) (Signed)
?  Chronic Care Management  ? ?Outreach Note ? ?05/21/2021 ?Name: ISAIC SYLER MRN: [id number] DOB: 1940/06/20 ? ?Referred by: [name], MD ?Reason for referral : No chief complaint on file. ? ? ?An unsuccessful telephone outreach was attempted today. The patient was referred to the pharmacist for assistance with care management and care coordination.  ? ?Follow Up Plan:  ? ?Tatjana Dellinger ?Upstream Scheduler  ?

## 2021-05-22 ENCOUNTER — Telehealth: Payer: Self-pay | Admitting: Family Medicine

## 2021-05-22 NOTE — Telephone Encounter (Signed)
Patient scheduled.

## 2021-05-22 NOTE — Chronic Care Management (AMB) (Signed)
?  Chronic Care Management  ? ?Note ? ?05/22/2021 ?Name: JERIN FRANZEL MRN: [id number] DOB: 1940/11/07 ? ?JEOFFREY ELEAZER is a 81 y.o. year old male who is a primary care patient of [name], MD. I reached out to [name] by phone today in response to a referral sent by Mr. Orvilla Fus PCP, [name], MD.  ? ?Mr. Cho was given information about Chronic Care Management services today including:  ?CCM service includes personalized support from designated clinical staff supervised by his physician, including individualized plan of care and coordination with other care providers ?24/7 contact phone numbers for assistance for urgent and routine care needs. ?Service will only be billed when office clinical staff spend 20 minutes or more in a month to coordinate care. ?Only one practitioner may furnish and bill the service in a calendar month. ?The patient may stop CCM services at any time (effective at the end of the month) by phone call to the office staff. ? ? ?Patient agreed to services and verbal consent obtained.  ? ?Follow up plan: ?PT AWARE DEDUCTIBLE AND COPAY ? ?Tatjana Dellinger ?Upstream Scheduler  ?

## 2021-05-24 ENCOUNTER — Other Ambulatory Visit: Payer: Self-pay | Admitting: Cardiovascular Disease

## 2021-05-24 ENCOUNTER — Other Ambulatory Visit: Payer: Self-pay | Admitting: Family Medicine

## 2021-05-24 DIAGNOSIS — E02 Subclinical iodine-deficiency hypothyroidism: Secondary | ICD-10-CM

## 2021-05-25 ENCOUNTER — Other Ambulatory Visit: Payer: Self-pay | Admitting: Family Medicine

## 2021-05-25 ENCOUNTER — Encounter: Payer: Self-pay | Admitting: Emergency Medicine

## 2021-05-25 ENCOUNTER — Other Ambulatory Visit: Payer: Self-pay

## 2021-05-25 ENCOUNTER — Encounter: Payer: Self-pay | Admitting: Family Medicine

## 2021-05-25 ENCOUNTER — Ambulatory Visit
Admission: EM | Admit: 2021-05-25 | Discharge: 2021-05-25 | Disposition: A | Discharge status: Home / Self Care | Payer: Medicare Other | Attending: Emergency Medicine | Admitting: Emergency Medicine

## 2021-05-25 DIAGNOSIS — R002 Palpitations: Secondary | ICD-10-CM

## 2021-05-25 DIAGNOSIS — I10 Essential (primary) hypertension: Secondary | ICD-10-CM | POA: Diagnosis not present

## 2021-05-25 NOTE — Telephone Encounter (Signed)
I spoke with pt; pt said BP started going up when pt was advised to stop Lisinopril 5 mg about 2 -3 wks ago. Pt said today BP earlier this morning was 173/72 P 59; then recked BP 175/70. At 9:20 pt took amlodipine 5 mg taking 2 tabs instead of 1 (pt took extra pill on his own not advised by provider) and also took carvedilol 6.25 mg. Pt said sometimes he takes his BP and so high it will not register on BP cuff. Pt said he just cked BP at 10:25 and would not register. Pt waited few mins and recked and BP 147/61 P 65 but pt feels like heart is pounding. No CP,SOB,vision changes, dizziness or H/A pt said he feels funny and when asked what that means he said "I don't feel right" and pt could not explain any further. No available appts at [facility] and pt is going to [facility] now and will take BP cuff to compare at [facility]. Sending note to Dr Darnell Level and Lattie Haw CMA and will teams [facility].  ?

## 2021-05-25 NOTE — ED Triage Notes (Signed)
Pt presents with elevated BP today. He reports BP 170's/80's he was taking BP medication but it was dc'd due to his potassium levels being elevated. Pt didn't feel right this morning he cant explain what it is. He brought in his BP machine to compare out reading.  ?

## 2021-05-25 NOTE — ED Provider Notes (Signed)
?[facility] ? ? ? ?CSN: [id number] ?Arrival date & time: 05/25/21  1113 ? ? ?  ? ?History   ?Chief Complaint ?Chief Complaint  ?Patient presents with  ? Hypertension  ? ? ?HPI ?Scott [name] is a 81 y.o. male.  Patient presents with concern for elevated blood pressure at home.  He recently stopped taking lisinopril.  He took an extra tablet of amlodipine at his own discretion this morning.  He is also on carvedilol.  Patient contacted his PCP this morning and reported that his heart was pounding and he did not feel right; he was instructed to come to urgent care.  Patient is currently asymptomatic; He denies chest pain, palpitations, shortness of breath, focal weakness, numbness, dizziness, headache, or other symptoms.  His medical history includes hypertension, CAD, hyperlipidemia, CKD, diabetes, chronic lymphocytic leukemia. ? ?The history is provided by the patient and medical records.  ? ?Past Medical History:  ?Diagnosis Date  ? Acquired hand deformity 1962  ? hand saw accident at work  ? Anemia 10/11/2011  ? Arthritis   ? Bradycardia 10/10/2011  ? CAD (coronary artery disease) 10/10/2011  ? MI s/p PTCA (Dx-OM2 proximal concentric stenosis)  ? Carotid artery disease ([place])   ? Cellulitis of buttock, left 11/17/2018  ? Chronic edema   ? CKD (chronic kidney disease) stage 3, GFR 30-59 ml/min (HCC)   ? [facility]cy Moore  ? CLL (chronic lymphocytic leukemia) ([place])   ? Colon polyps   ? Cough 04/28/2019  ? Dysphagia   ? EGD - reflux esophagitis, one area of stomach with focal intestinal metaplasia (06/2019) Dr Marius Ditch  ? Generalized headaches   ? frequent  ? GI bleed 07/08/2017  ? Glaucoma   ? s/p laser surgery  ? HLD (hyperlipidemia)   ? Hyperkalemia 02/28/2018  ? Hypertension   ? Hypothyroidism 10/10/2011  ? Iron deficiency anemia due to chronic blood loss   ? Squamous cell carcinoma of skin 06/15/2019  ? Right posterior ear. SCCis, hypertrophic, crusted  ? Thrombocytopenia ([place]) 10/11/2011  ? Type 2 diabetes with nephropathy  ([place])   ? DM refresher course [facility] (04/2013)  ? ? ?Patient Active Problem List  ? Diagnosis Date Noted  ? Encounter for general adult medical examination with abnormal findings 05/09/2021  ? Exocrine pancreatic insufficiency 12/18/2020  ? Hearing loss 08/18/2020  ? Pancytopenia ([place]) 04/19/2020  ? Chronic midline low back pain without sciatica 10/13/2019  ? Urinary dribbling 09/10/2019  ? Gastritis with intestinal metaplasia of stomach   ? 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 Left foot pain 11/17/2018  ? Cervical spondylosis 03/27/2018  ? Carotid stenosis, asymptomatic, bilateral 03/26/2018  ? Sternal fracture 09/13/2017  ? Tinea corporis 08/23/2017  ? Pedal edema 08/23/2017  ? Glaucoma   ? Colon polyps   ? Arthritis   ? CLL (chronic lymphocytic leukemia) ([place]) 01/18/2016  ? Chronic insomnia 01/03/2016  ? Diabetic neuropathy ([place]) 06/09/2015  ? BPV (benign positional vertigo) 05/23/2014  ? Advanced care planning/counseling discussion 01/25/2014  ? Hyperlipidemia associated with type 2 diabetes mellitus ([place])   ? CKD (chronic kidney disease) stage 3, GFR 30-59 ml/min (HCC)   ? Type 2 diabetes mellitus with diabetic chronic kidney disease ([place])   ? Anemia in CKD (chronic kidney disease) 10/11/2011  ? Idiopathic thrombocytopenic purpura (ITP) (HCC) 10/11/2011  ? CAD (coronary artery disease) 10/10/2011  ? Hypertension 10/10/2011  ? Hypothyroidism 10/10/2011  ? Acquired hand deformity 03/04/1960  ? ? ?  Past Surgical History:  ?Procedure Laterality Date  ? BACK SURGERY    ? cervical neck  ? CATARACT EXTRACTION, BILATERAL Bilateral 2017  ? COLONOSCOPY  11/2008  ? 1 polyp, diverticulosis, rec rpt 5 yrs (Dr. Alferd Apa)  ? COLONOSCOPY  06/2014  ? hyperplastic polyp, rpt 5 yrs Oletta Lamas)  ? COLONOSCOPY WITH PROPOFOL N/A 11/17/2017  ? TA, HP, (Vanga, Tally Due, MD)  ? ESOPHAGOGASTRODUODENOSCOPY (EGD) WITH PROPOFOL N/A 11/17/2017  ? healing erosive gastritis, intestinal  metaplasia, neg H pylori (Vanga, Tally Due, MD)  ? ESOPHAGOGASTRODUODENOSCOPY (EGD) WITH PROPOFOL N/A 06/28/2019  ? erosive gastropathy with stigmata of recent bleed, granular tissue in esophagus biopsied- reflux esophagitis, small HH (Vanga, Tally Due, MD)  ? EYE SURGERY  2012  ? laser surgery for glaucoma  ? PORTACATH PLACEMENT Left 12/09/2018  ? Procedure: INSERTION PORT-A-CATH;  Surgeon: [name], MD;  Location: [facility] ORS;  Service: General;  Laterality: Left;  ? PTCA  1994, 1995  ? US ECHOCARDIOGRAPHY  10/2013  ? inferior wall hypokinesis, mild LVH, EF 50-55%, mild MR and LA dilation  ? ? ? ? ? ?Home Medications   ? ?Prior to Admission medications   ?Medication Sig Start Date End Date Taking? Authorizing Provider  ?acetaminophen (TYLENOL) 650 MG CR tablet Take 1,300 mg by mouth 2 (two) times daily.    [provider]  ?amLODipine (NORVASC) 5 MG tablet Take 1 tablet by mouth once daily 04/11/21   [name], MD  ?atorvastatin (LIPITOR) 80 MG tablet Take 1 tablet (80 mg total) by mouth daily. 04/11/21   [name], MD  ?carvedilol (COREG) 6.25 MG tablet TAKE 1 TABLET BY MOUTH TWICE DAILY WITH MEALS 04/11/21   [name], MD  ?Continuous Blood Gluc Receiver (FREESTYLE LIBRE 14 DAY READER) DEVI Use as directed to check sugars QID AC HS 09/13/20   [name], MD  ?Continuous Blood Gluc Sensor (FREESTYLE LIBRE 14 DAY SENSOR) MISC Use as directed to check sugars QID HS 04/18/21   [name], MD  ?ezetimibe (ZETIA) 10 MG tablet Take 1 tablet by mouth once daily 04/11/21   Minna Merritts, MD  ?hydroxyurea (HYDREA) 500 MG capsule Take 1,000 mg by mouth daily. ?Patient not taking: Reported on 04/28/2021 04/17/21   [provider]  ?insulin detemir (LEVEMIR FLEXTOUCH) 100 UNIT/ML FlexPen Inject 14 Units into the skin daily. 03/02/21   [name], MD  ?levothyroxine (SYNTHROID) 88 MCG tablet Take 1 tablet by mouth once daily 04/29/21   [name], MD   ?lipase/protease/amylase (CREON) 36000 UNITS CPEP capsule Take 2 capsules with the first bite of each meal and 1 capsule with the first bite of each snack 12/12/20   Lin Landsman, MD  ?meclizine (ANTIVERT) 12.5 MG tablet Take 1 tablet (12.5 mg total) by mouth 2 (two) times daily as needed for dizziness (sedation precautions). 05/09/21   [name], MD  ?nitroGLYCERIN (NITROSTAT) 0.4 MG SL tablet Place 1 tablet (0.4 mg total) under the tongue every 5 (five) minutes as needed for chest pain ((MAX of 3 doses)). 02/27/18   [name], MD  ?Cira Servant FLEXPEN 100 UNIT/ML FlexPen Inject 3-4 Units into the skin 3 (three) times daily with meals. 03/02/21   [name], MD  ?oxymetazoline (AFRIN) 0.05 % nasal spray Place 1 spray into left nostril daily as needed (nose bleeds).    [provider]  ?traZODone (DESYREL) 100 MG tablet TAKE 1 TABLET BY MOUTH AT BEDTIME AS NEEDED FOR SLEEP 03/02/21   [name],  MD  ?triamcinolone ointment (KENALOG) 0.5 % Apply 1 application topically 2 (two) times daily. 08/24/20   Owens Loffler, MD  ? ? ?Family History ?Family History  ?Problem Relation Age of Onset  ? Diabetes Sister   ? Stomach cancer Sister 62  ? Pneumonia Father   ?     caused death  ? Other Brother   ?     no communication with brother so unsure of any health conditions  ? Coronary artery disease Son 62  ?     5v CABG and stents  ? Hyperlipidemia Sister   ? Stroke Neg Hx   ? Heart attack Neg Hx   ? ? ?Social History ?Social History  ? ?Tobacco Use  ? Smoking status: Former  ?  Types: Cigarettes  ?  Quit date: 03/04/1978  ?  Years since quitting: 43.2  ? Smokeless tobacco: Never  ?Vaping Use  ? Vaping Use: Never used  ?Substance Use Topics  ? Alcohol use: No  ? Drug use: No  ? ? ? ?Allergies   ?Lisinopril ? ? ?Review of Systems ?Review of Systems  ?Eyes:  Negative for visual disturbance.  ?Respiratory:  Negative for cough and shortness of breath.   ?Cardiovascular:  Positive for  palpitations. Negative for chest pain.  ?Neurological:  Negative for dizziness, syncope, weakness, numbness and headaches.  ?All other systems reviewed and are negative. ? ? ?Physical Exam ?Triage Vital Signs ?ED Triage Vitals [03/

## 2021-05-25 NOTE — Discharge Instructions (Addendum)
Go to the emergency department if you have chest pain, heart palpitations, or other concerning symptoms.   ? ?Follow up with your primary care provider on Monday.   ?    ? ?

## 2021-05-26 DIAGNOSIS — U071 COVID-19: Secondary | ICD-10-CM | POA: Diagnosis not present

## 2021-05-28 ENCOUNTER — Telehealth: Payer: Self-pay | Admitting: Family Medicine

## 2021-05-28 DIAGNOSIS — E02 Subclinical iodine-deficiency hypothyroidism: Secondary | ICD-10-CM

## 2021-05-28 NOTE — Telephone Encounter (Signed)
Pt came in office stated pharmacy charge him over $100. For RX . He would like for RX to be reentered and copy of med. List be fax to insurance [facility] script at [phone] and to [place] fax [phone]  ? ?Encourage patient to contact the pharmacy for refills or they can request refills through [facility] ? ?LAST APPOINTMENT DATE:  Please schedule appointment if longer than 1 year ? ?NEXT APPOINTMENT DATE: ? ?MEDICATION:atorvastatin (LIPITOR) 80 MG tablet ?insulin detemir (LEVEMIR FLEXTOUCH) 100 UNIT/ML FlexPen ?levothyroxine (SYNTHROID) 88 MCG tablet ?traZODone (DESYREL) 100 MG tablet ? ?Is the patient out of medication?  ? ?[place], [place] - [address]  ? ?Let patient know to contact pharmacy at the end of the day to make sure medication is ready. ? ?Please notify patient to allow 48-72 hours to process ? ?CLINICAL FILLS OUT ALL BELOW:  ? ?LAST REFILL: ? ?QTY: ? ?REFILL DATE: ? ? ? ?OTHER COMMENTS:  ? ? ?Okay for refill? ? ?Please advise ? ? ? ? ?

## 2021-05-29 ENCOUNTER — Other Ambulatory Visit: Payer: Self-pay

## 2021-05-29 ENCOUNTER — Encounter: Payer: Self-pay | Admitting: Oncology

## 2021-05-29 ENCOUNTER — Ambulatory Visit (INDEPENDENT_AMBULATORY_CARE_PROVIDER_SITE_OTHER): Payer: Medicare Other

## 2021-05-29 DIAGNOSIS — I6523 Occlusion and stenosis of bilateral carotid arteries: Secondary | ICD-10-CM

## 2021-05-29 MED ORDER — LEVOTHYROXINE SODIUM 88 MCG PO TABS: 88.0000 ug | ORAL_TABLET | Freq: Every day | ORAL | 3 refills | Status: DC

## 2021-05-29 MED ORDER — ATORVASTATIN CALCIUM 80 MG PO TABS: ORAL_TABLET | ORAL | 3 refills | Status: DC

## 2021-05-29 MED ORDER — LEVEMIR FLEXTOUCH 100 UNIT/ML ~~LOC~~ SOPN: 14.0000 [IU] | PEN_INJECTOR | Freq: Every day | SUBCUTANEOUS | 3 refills | Status: DC

## 2021-05-29 NOTE — Telephone Encounter (Signed)
E-scribed atorvastatin, Levemir and levothyroxine.  Trazodone was already sent on 05/25/21, #90/0.  ? ?Left message on vm per dpr notifying pt of above.  ? ?

## 2021-05-30 ENCOUNTER — Encounter: Payer: Self-pay | Admitting: Nurse Practitioner

## 2021-05-30 ENCOUNTER — Ambulatory Visit (INDEPENDENT_AMBULATORY_CARE_PROVIDER_SITE_OTHER): Payer: Medicare Other | Admitting: Nurse Practitioner

## 2021-05-30 ENCOUNTER — Other Ambulatory Visit: Payer: Self-pay | Admitting: Family Medicine

## 2021-05-30 ENCOUNTER — Telehealth: Payer: Self-pay | Admitting: Family Medicine

## 2021-05-30 ENCOUNTER — Other Ambulatory Visit: Payer: Medicare Other

## 2021-05-30 ENCOUNTER — Encounter: Payer: Self-pay | Admitting: Oncology

## 2021-05-30 ENCOUNTER — Ambulatory Visit (INDEPENDENT_AMBULATORY_CARE_PROVIDER_SITE_OTHER)
Admission: RE | Admit: 2021-05-30 | Discharge: 2021-05-30 | Disposition: A | Discharge status: Home / Self Care | Payer: Medicare Other | Source: Ambulatory Visit | Attending: Nurse Practitioner | Admitting: Nurse Practitioner

## 2021-05-30 VITALS — BP 152/60 | HR 56 | Temp 97.0°F | Resp 14 | Wt 152.1 lb

## 2021-05-30 DIAGNOSIS — J069 Acute upper respiratory infection, unspecified: Secondary | ICD-10-CM

## 2021-05-30 DIAGNOSIS — R051 Acute cough: Secondary | ICD-10-CM | POA: Diagnosis not present

## 2021-05-30 DIAGNOSIS — R059 Cough, unspecified: Secondary | ICD-10-CM | POA: Diagnosis not present

## 2021-05-30 MED ORDER — DOXYCYCLINE HYCLATE 100 MG PO TABS: 100.0000 mg | ORAL_TABLET | Freq: Two times a day (BID) | ORAL | 0 refills | Status: AC

## 2021-05-30 NOTE — Telephone Encounter (Signed)
Noted will see patient at office visit ? ?

## 2021-05-30 NOTE — Telephone Encounter (Signed)
Spoke to patient by telephone and was advised that he has had a cough for about two weeks. Patient denies a fever or SOB. Patient stated that he has done two covid test that were negative. Patient stated that his cough is productive/white-dark gray. ?Patient schedule for an appointment today 05/30/21 with Romilda Garret NP at 3:00 pm. ?

## 2021-05-30 NOTE — Patient Instructions (Signed)
Nice to see you today ?Follow up if no improvement or symptoms worsen ?I will be in touch once I have the xray results ?Sent the medication to the pharmacy on file ?

## 2021-05-30 NOTE — Assessment & Plan Note (Signed)
Given length of symptoms and patient's presentation will like to treat with doxycycline 100 mg twice daily for 7 days.  Pending chest x-ray also.  Follow-up if no improvement ?

## 2021-05-30 NOTE — Assessment & Plan Note (Signed)
Patient has been complaining of acute cough.  Getting good relief with Mucinex over-the-counter may continue doing Mucinex over-the-counter.  Pending chest x-ray follow-up if no improvement. ?

## 2021-05-30 NOTE — Progress Notes (Signed)
? ?Acute Office Visit ? ?Subjective:  ? ? Patient ID: Scott [name], male    DOB: Jun 07, 1940, 81 y.o.   MRN: [id number] ? ?Chief Complaint  ?Patient presents with  ? Cough  ?  At least 2 weeks now. Some post nasal drip, feels some rattling with coughing. Coughing up slightly yellow mucus at times. No headache, no fever, no sore throat. Covid test negative at home x 2 on 05/29/21. Has taking Mucinnex and used cough drops  ? ? ? ?Patient is in today for cough ? ?Syptoms started approx 2 weeks ago ?Covid test on 05/29/2021 that was negaitve ?Covid vaccine x4 ?Flu shot utd ?No sick contacts ?Has tried mucinex with good relief  ? ?Past Medical History:  ?Diagnosis Date  ? Acquired hand deformity 1962  ? hand saw accident at work  ? Anemia 10/11/2011  ? Arthritis   ? Bradycardia 10/10/2011  ? CAD (coronary artery disease) 10/10/2011  ? MI s/p PTCA (Dx-OM2 proximal concentric stenosis)  ? Carotid artery disease ([place])   ? Cellulitis of buttock, left 11/17/2018  ? Chronic edema   ? CKD (chronic kidney disease) stage 3, GFR 30-59 ml/min (HCC)   ? [facility]cy Moore  ? CLL (chronic lymphocytic leukemia) ([place])   ? Colon polyps   ? Cough 04/28/2019  ? Dysphagia   ? EGD - reflux esophagitis, one area of stomach with focal intestinal metaplasia (06/2019) Dr Marius Ditch  ? Generalized headaches   ? frequent  ? GI bleed 07/08/2017  ? Glaucoma   ? s/p laser surgery  ? HLD (hyperlipidemia)   ? Hyperkalemia 02/28/2018  ? Hypertension   ? Hypothyroidism 10/10/2011  ? Iron deficiency anemia due to chronic blood loss   ? Squamous cell carcinoma of skin 06/15/2019  ? Right posterior ear. SCCis, hypertrophic, crusted  ? Thrombocytopenia ([place]) 10/11/2011  ? Type 2 diabetes with nephropathy ([place])   ? DM refresher course [facility] (04/2013)  ? ? ?Past Surgical History:  ?Procedure Laterality Date  ? BACK SURGERY    ? cervical neck  ? CATARACT EXTRACTION, BILATERAL Bilateral 2017  ? COLONOSCOPY  11/2008  ? 1 polyp, diverticulosis, rec rpt 5 yrs (Dr. Alferd Apa)  ? COLONOSCOPY   06/2014  ? hyperplastic polyp, rpt 5 yrs Oletta Lamas)  ? COLONOSCOPY WITH PROPOFOL N/A 11/17/2017  ? TA, HP, (Vanga, Tally Due, MD)  ? ESOPHAGOGASTRODUODENOSCOPY (EGD) WITH PROPOFOL N/A 11/17/2017  ? healing erosive gastritis, intestinal metaplasia, neg H pylori (Vanga, Tally Due, MD)  ? ESOPHAGOGASTRODUODENOSCOPY (EGD) WITH PROPOFOL N/A 06/28/2019  ? erosive gastropathy with stigmata of recent bleed, granular tissue in esophagus biopsied- reflux esophagitis, small HH (Vanga, Tally Due, MD)  ? EYE SURGERY  2012  ? laser surgery for glaucoma  ? PORTACATH PLACEMENT Left 12/09/2018  ? Procedure: INSERTION PORT-A-CATH;  Surgeon: [name], MD;  Location: [facility] ORS;  Service: General;  Laterality: Left;  ? PTCA  1994, 1995  ? US ECHOCARDIOGRAPHY  10/2013  ? inferior wall hypokinesis, mild LVH, EF 50-55%, mild MR and LA dilation  ? ? ?Family History  ?Problem Relation Age of Onset  ? Diabetes Sister   ? Stomach cancer Sister 6  ? Pneumonia Father   ?     caused death  ? Other Brother   ?     no communication with brother so unsure of any health conditions  ? Coronary artery disease Son 60  ?     5v CABG and stents  ? Hyperlipidemia Sister   ?  Stroke Neg Hx   ? Heart attack Neg Hx   ? ? ?Social History  ? ?Socioeconomic History  ? Marital status: Widowed  ?  Spouse name: Not on file  ? Number of children: Not on file  ? Years of education: Not on file  ? Highest education level: Not on file  ?Occupational History  ? Not on file  ?Tobacco Use  ? Smoking status: Former  ?  Types: Cigarettes  ?  Quit date: 03/04/1978  ?  Years since quitting: 43.2  ? Smokeless tobacco: Never  ?Vaping Use  ? Vaping Use: Never used  ?Substance and Sexual Activity  ? Alcohol use: No  ? Drug use: No  ? Sexual activity: Never  ?Other Topics Concern  ? Not on file  ?Social History Narrative  ? Widower. Wife passed 08/2015 from cancer. 1 dog  ? Occupation: retired, was route Hotel manager  ? Edu: 11th gr  ? Activity: walks dog (pomeranian) 3-4x/day,  yardwork, rides bicycle.  ? Diet: drinks diet coke, no vegetables  ? ?Social Determinants of Health  ? ?Financial Resource Strain: Not on file  ?Food Insecurity: Not on file  ?Transportation Needs: Not on file  ?Physical Activity: Not on file  ?Stress: Not on file  ?Social Connections: Not on file  ?Intimate Partner Violence: Not on file  ? ? ?Outpatient Medications Prior to Visit  ?Medication Sig Dispense Refill  ? acetaminophen (TYLENOL) 650 MG CR tablet Take 1,300 mg by mouth 2 (two) times daily.    ? amLODipine (NORVASC) 5 MG tablet Take 2 tablets (10 mg total) by mouth daily.    ? atorvastatin (LIPITOR) 80 MG tablet TAKE 1 TABLET BY MOUTH ON MONDAY, WEDNESDAY AND FRIDAY 39 tablet 3  ? carvedilol (COREG) 6.25 MG tablet TAKE 1 TABLET BY MOUTH TWICE DAILY WITH MEALS 180 tablet 0  ? Continuous Blood Gluc Receiver (FREESTYLE LIBRE 14 DAY READER) DEVI Use as directed to check sugars QID AC HS 2 each 1  ? Continuous Blood Gluc Sensor (FREESTYLE LIBRE 14 DAY SENSOR) MISC Use as directed to check sugars QID HS 2 each 11  ? ezetimibe (ZETIA) 10 MG tablet Take 1 tablet by mouth once daily 90 tablet 0  ? insulin detemir (LEVEMIR FLEXTOUCH) 100 UNIT/ML FlexPen Inject 14 Units into the skin daily. 15 mL 3  ? levothyroxine (SYNTHROID) 88 MCG tablet Take 1 tablet (88 mcg total) by mouth daily. 90 tablet 3  ? lipase/protease/amylase (CREON) 36000 UNITS CPEP capsule Take 2 capsules with the first bite of each meal and 1 capsule with the first bite of each snack 240 capsule 2  ? meclizine (ANTIVERT) 12.5 MG tablet Take 1 tablet (12.5 mg total) by mouth 2 (two) times daily as needed for dizziness (sedation precautions). 20 tablet 0  ? nitroGLYCERIN (NITROSTAT) 0.4 MG SL tablet Place 1 tablet (0.4 mg total) under the tongue every 5 (five) minutes as needed for chest pain ((MAX of 3 doses)). 25 tablet 0  ? NOVOLOG FLEXPEN 100 UNIT/ML FlexPen Inject 3-4 Units into the skin 3 (three) times daily with meals. 15 mL 0  ? oxymetazoline  (AFRIN) 0.05 % nasal spray Place 1 spray into left nostril daily as needed (nose bleeds).    ? traZODone (DESYREL) 100 MG tablet TAKE 1 TABLET BY MOUTH AT BEDTIME AS NEEDED FOR SLEEP 90 tablet 0  ? triamcinolone ointment (KENALOG) 0.5 % Apply 1 application topically 2 (two) times daily. 30 g 1  ? hydroxyurea (HYDREA) 500  MG capsule Take 1,000 mg by mouth daily. (Patient not taking: Reported on 04/28/2021)    ? ?Facility-Administered Medications Prior to Visit  ?Medication Dose Route Frequency Provider Last Rate Last Admin  ? sodium chloride flush (NS) 0.9 % injection 10 mL  10 mL Intravenous Once Lloyd Huger, MD      ? ? ?Allergies  ?Allergen Reactions  ? Lisinopril Other (See Comments)  ?  Hyperkalemia even at '5mg'$  dose  ? ? ?Review of Systems  ?Constitutional:  Negative for appetite change, chills, fatigue and fever.  ?HENT:  Positive for postnasal drip. Negative for congestion, ear discharge, ear pain, sinus pressure, sinus pain and sore throat.   ?Respiratory:  Positive for cough (light brown clear). Negative for shortness of breath.   ?Cardiovascular:  Negative for chest pain.  ?Gastrointestinal:  Negative for diarrhea, nausea and vomiting.  ?Neurological:  Negative for headaches.  ? ?   ?Objective:  ?  ?Physical Exam ?Vitals and nursing note reviewed.  ?Constitutional:   ?   Appearance: Normal appearance.  ?HENT:  ?   Right Ear: Tympanic membrane, ear canal and external ear normal.  ?   Left Ear: Tympanic membrane, ear canal and external ear normal.  ?   Nose:  ?   Right Sinus: No maxillary sinus tenderness or frontal sinus tenderness.  ?   Left Sinus: No maxillary sinus tenderness or frontal sinus tenderness.  ?   Mouth/Throat:  ?   Mouth: Mucous membranes are moist.  ?   Pharynx: Oropharynx is clear.  ?Eyes:  ?   Pupils: Pupils are equal, round, and reactive to light.  ?Cardiovascular:  ?   Rate and Rhythm: Normal rate and regular rhythm.  ?   Heart sounds: Normal heart sounds.  ?Pulmonary:  ?    Effort: Pulmonary effort is normal.  ?   Breath sounds: Normal breath sounds.  ?Lymphadenopathy:  ?   Cervical: No cervical adenopathy.  ?Neurological:  ?   Mental Status: He is alert.  ? ? ?BP (!) 152/60   Puls

## 2021-05-30 NOTE — Telephone Encounter (Signed)
Pt called stating that he is having chest congestion. Pt states he took a covid test and it was negative. Pt is asking if you could call something in. Please advise. ?

## 2021-05-31 ENCOUNTER — Encounter: Payer: Self-pay | Admitting: Oncology

## 2021-05-31 ENCOUNTER — Telehealth: Payer: Self-pay

## 2021-05-31 NOTE — Telephone Encounter (Signed)
[facility] - Client ?TELEPHONE ADVICE RECORD ?Access[facility]? ?Patient ?Name: ?Scott [name] ?NER ?Gender: Male ?DOB: 12-14-1940 ?Age: 81 Y 33 M 24 ?D ?Return ?Phone ?Number: ?[phone] ?(Primary) ?Address: ?City/ ?State/ ?Zip: [place] ? [address] ?Client [facility] - Client ?Client Site [facility] ?Provider [name] - MD ?Contact Type Call ?Who Is Calling Patient / Member / Family / Caregiver ?Call Type Triage / Clinical ?Relationship To Patient Self ?Return Phone Number [phone] (Primary) ?Chief Complaint Prescription Refill or Medication Request (non ?symptomatic) ?Reason for Call Medication Question / Request ?Initial Comment Caller states his medication was supposed to be ?called in for Novalog. The pharmacy states there ?is no script in for it. The nurse Lattie Haw states it was ?called in. ?Translation No ?Disp. Time (Eastern ?Time) Disposition Final User ?05/30/2021 5:55:08 PM Send To Nurse Ferne Coe, RN, Luz ?05/30/2021 5:55:31 PM Send To Nurse Ferne Coe, RN, Luz ?05/30/2021 6:53:22 PM Attempt made - message left Dunn, RN, Tiffani ?05/30/2021 7:04:32 PM Attempt made - message left Dunn, RN, Tiffani ?05/30/2021 7:20:23 PM FINAL ATTEMPT MADE - message ?left ?Dunn, RN, Tiffani ?05/30/2021 7:20:42 PM Send to RN Final Attempt [name], RN, Tiffani ?05/30/2021 9:42:00 PM Attempt made - message left Tamala Julian, RN, Celso Sickle ?05/30/2021 10:01:59 PM FINAL ATTEMPT MADE - ?message left ?Yes Tamala Julian, RN, Sharrie Rothman ?

## 2021-06-01 NOTE — Telephone Encounter (Signed)
Rx sent on 05/31/21. ?

## 2021-06-06 ENCOUNTER — Telehealth: Payer: Self-pay | Admitting: Family Medicine

## 2021-06-06 MED ORDER — HYDROCHLOROTHIAZIDE 12.5 MG PO CAPS: 12.5000 mg | ORAL_CAPSULE | Freq: Every day | ORAL | 3 refills | Status: DC

## 2021-06-06 NOTE — Telephone Encounter (Signed)
Spoke to patient by telephone and was advised that he started doubling up on her Amlodipine about a week ago. Patient stated that he noticed yesterday that both of his ankles were swollen. Patient stated that his ankles were swollen worse today. Patient stated that his blood pressure yesterday was 131/72. Patient checked his blood pressure while on the phone and it was 128/69 heart rate 69. Patient denies SOB or any other symptoms. ?Pharmacy [facility] ?

## 2021-06-06 NOTE — Telephone Encounter (Signed)
Pt called stating that Dr Darnell Level told him to double up on medication amLODipine (NORVASC) 5 MG tablet and if it made his ankle swell to let him know. Pt states he did double up on medication and his ankle did swell. Please advise. ?

## 2021-06-06 NOTE — Telephone Encounter (Signed)
Higher amlodipine has helped control BP however now side effect of ankle swelling. ?Recommend he drop back to amlodipine '5mg'$  daily, continue carvedilol 6.'25mg'$  BID. ? ?Would have him try hydrochlorothiazide 12.'5mg'$  daily water pill sent to pharmacy. Update [place] with effect.  ?

## 2021-06-07 NOTE — Telephone Encounter (Signed)
Spoke with pt relaying Dr. G's message. Pt verbalizes understanding.  

## 2021-06-13 ENCOUNTER — Telehealth: Payer: Self-pay | Admitting: Cardiovascular Disease

## 2021-06-13 NOTE — Telephone Encounter (Signed)
Called to speak with patient. No answer. Lmtcb. ?

## 2021-06-13 NOTE — Telephone Encounter (Addendum)
Current BP regimen is hctz 12.'5mg'$  daily, amlodipine '5mg'$  daily, carvedilol 6.'25mg'$  bid.  ? ?Please offer appt with me 12pm Tues 4/18.  ?

## 2021-06-13 NOTE — Telephone Encounter (Signed)
?  Pt c/o Shortness Of Breath: STAT if SOB developed within the last 24 hours or pt is noticeably SOB on the phone ? ?1. Are you currently SOB (can you hear that pt is SOB on the phone)? No  ? ?2. How long have you been experiencing SOB? Few weeks ? ?3. Are you SOB when sitting or when up moving around? Moving around ? ?4. Are you currently experiencing any other symptoms? Elevated HR ? ?Pt said his SOB is getting worst, he said even if he just walk a couple of steps he gets SOB. He also said his HR speeds up, he checked his BP 163/78 HR 65 ?

## 2021-06-13 NOTE — Telephone Encounter (Signed)
Spoke with patient. Patient reports that for several weeks he has been short of breath. When he's out in the yard doing things he'll get SOB and tired and have to stop what he's doing for a little bit then start again. Also reports that yesterday he had a few minutes of feeling like his heart rate was elevated while he was in the yard, but that subsided on its own. Reports that SBP has been anywhere from the 120-160s. Of note, PCP has recently changed some of his BP medications.  ? ?Recommended that pt reach out to his PCP regarding the continued SOB, as his PCP saw him for a URI and cough 2 weeks ago. Pt stated that he was concerned there is something wrong with his heart.  ? ?Appointment made for 5/19. Patient added to waitlist. Patient again encouraged to call his PCP regarding his continued SOB. Pt verbalized understanding and voiced appreciation for the call.  ?

## 2021-06-13 NOTE — Telephone Encounter (Signed)
Patient's friend, Scott [name], is returning call. ?

## 2021-06-14 NOTE — Telephone Encounter (Signed)
Spoke with pt offering 4/18 OV at 12 PM.  Pt agreed, OV scheduled.  ?

## 2021-06-15 ENCOUNTER — Telehealth: Payer: Self-pay

## 2021-06-15 NOTE — Chronic Care Management (AMB) (Signed)
? ? ?Chronic Care Management ?Pharmacy Assistant  ? ?Name: Scott [name]  MRN: [id number] DOB: 03-13-1940 ? ?Scott [name] is an 81 y.o. year old male who presents for his initial CCM visit with the clinical pharmacist. ? ?Reason for Encounter: Initial Questions ?  ?Conditions to be addressed/monitored: ?CAD, HTN, HLD, DMII, and CKD Stage 3 ? ?Recent office visits:  ?05/30/21-Family Medicine-James Cable,NP- upper respiratory tract infectio-negative covid -treat with doxycycline 100 mg twice daily for 7 days.  ?05/09/21-PCP-Javier Gutierrez,MD-AWV- Labs ordered ( Your potassium returned normal off lisinopril - stay off this medication.) New A1c 8.6- Referral to Endocrinology-check pharmacy for shingrix series ? ?Recent consult visits:  ?05/25/21-[facility]- elevated blood pressure- He recently stopped taking lisinopril(allergy)Labs,EKG,follow up PCP ?05/17/21-[facility]-Timothy Finnegan,MD-Follow up Leukemia-labs ordered-Patient will require premedications for any further infusions with Rituxan ? ?Hospital visits:  ?None in previous 6 months ? ?Medications: ?Outpatient Encounter Medications as of 06/15/2021  ?Medication Sig Note  ? acetaminophen (TYLENOL) 650 MG CR tablet Take 1,300 mg by mouth 2 (two) times daily.   ? amLODipine (NORVASC) 5 MG tablet Take 1 tablet (5 mg total) by mouth daily.   ? atorvastatin (LIPITOR) 80 MG tablet TAKE 1 TABLET BY MOUTH ON MONDAY, WEDNESDAY AND FRIDAY   ? carvedilol (COREG) 6.25 MG tablet TAKE 1 TABLET BY MOUTH TWICE DAILY WITH MEALS   ? Continuous Blood Gluc Receiver (FREESTYLE LIBRE 14 DAY READER) DEVI Use as directed to check sugars QID AC HS   ? Continuous Blood Gluc Sensor (FREESTYLE LIBRE 14 DAY SENSOR) MISC Use as directed to check sugars QID HS   ? ezetimibe (ZETIA) 10 MG tablet Take 1 tablet by mouth once daily   ? hydrochlorothiazide (MICROZIDE) 12.5 MG capsule Take 1 capsule (12.5 mg total) by mouth daily.   ? hydroxyurea (HYDREA) 500 MG capsule Take  1,000 mg by mouth daily. (Patient not taking: Reported on 04/28/2021) 05/30/2021: Not sure if he is suppose to still be taking this  ? insulin detemir (LEVEMIR FLEXTOUCH) 100 UNIT/ML FlexPen Inject 14 Units into the skin daily.   ? levothyroxine (SYNTHROID) 88 MCG tablet Take 1 tablet (88 mcg total) by mouth daily.   ? lipase/protease/amylase (CREON) 36000 UNITS CPEP capsule Take 2 capsules with the first bite of each meal and 1 capsule with the first bite of each snack   ? meclizine (ANTIVERT) 12.5 MG tablet Take 1 tablet (12.5 mg total) by mouth 2 (two) times daily as needed for dizziness (sedation precautions).   ? nitroGLYCERIN (NITROSTAT) 0.4 MG SL tablet Place 1 tablet (0.4 mg total) under the tongue every 5 (five) minutes as needed for chest pain ((MAX of 3 doses)).   ? NOVOLOG FLEXPEN 100 UNIT/ML FlexPen INJECT 5 TO 8 UNITS SUBCUTANEOUSLY THREE TIMES DAILY WITH MEALS   ? oxymetazoline (AFRIN) 0.05 % nasal spray Place 1 spray into left nostril daily as needed (nose bleeds).   ? traZODone (DESYREL) 100 MG tablet TAKE 1 TABLET BY MOUTH AT BEDTIME AS NEEDED FOR SLEEP   ? triamcinolone ointment (KENALOG) 0.5 % Apply 1 application topically 2 (two) times daily.   ? ?Facility-Administered Encounter Medications as of 06/15/2021  ?Medication  ? sodium chloride flush (NS) 0.9 % injection 10 mL  ? ? ?Lab Results  ?Component Value Date/Time  ? HGBA1C 8.6 (H) 05/09/2021 10:22 AM  ? HGBA1C 8.0 (A) 12/18/2020 10:22 AM  ? HGBA1C 7.9 (A) 08/18/2020 10:40 AM  ? HGBA1C 8.0 (H) 04/12/2020 09:01 AM  ?  MICROALBUR 29.0 (H) 02/22/2019 10:07 AM  ? MICROALBUR 5.6 (H) 03/27/2018 09:45 AM  ?  ? ?BP Readings from Last 3 Encounters:  ?05/30/21 (!) 152/60  ?05/25/21 123/81  ?05/17/21 129/65  ? ? ? ? ?Patient contacted to confirm telephone appointment with Charlene Brooke, Pharm D, on 06/20/21 at 1:30pm. ?Spoke with the patient 06/19/21- He  has no major health complaints. He does have high payment for the BG monitor system .Freestyle  Libre ? ? ? ?Star Rating Drugs:  ?Medication:  Last Fill: Day Supply ?Atorvastatin '80mg'$  05/27/21 84 ?Levemir  05/30/21 108 ?Novolog  05/31/21 62 ?Lisinopril '5mg'$   04/26/21 30 ? ? ?Care Gaps: ?Annual wellness visit in last year? Yes ?Most Recent BP reading:152/60  56-P  05/30/21 ? ?If Diabetic: ?Most recent A1C reading:8.6  05/09/21 ?Last eye exam / retinopathy screening:UTD ?Last diabetic foot exam:UTD ? ?Charlene Brooke, CPP notified ? ?Nevaen Tredway, CCMA ?Health concierge  ?[phone]  ?

## 2021-06-19 ENCOUNTER — Encounter: Payer: Self-pay | Admitting: Family Medicine

## 2021-06-19 ENCOUNTER — Ambulatory Visit (INDEPENDENT_AMBULATORY_CARE_PROVIDER_SITE_OTHER): Payer: Medicare Other | Admitting: Family Medicine

## 2021-06-19 VITALS — BP 140/78 | HR 62 | Temp 97.9°F | Ht 69.5 in | Wt 151.1 lb

## 2021-06-19 DIAGNOSIS — I6523 Occlusion and stenosis of bilateral carotid arteries: Secondary | ICD-10-CM | POA: Diagnosis not present

## 2021-06-19 DIAGNOSIS — I2510 Atherosclerotic heart disease of native coronary artery without angina pectoris: Secondary | ICD-10-CM

## 2021-06-19 DIAGNOSIS — I10 Essential (primary) hypertension: Secondary | ICD-10-CM

## 2021-06-19 DIAGNOSIS — R0609 Other forms of dyspnea: Secondary | ICD-10-CM | POA: Insufficient documentation

## 2021-06-19 DIAGNOSIS — Z794 Long term (current) use of insulin: Secondary | ICD-10-CM

## 2021-06-19 DIAGNOSIS — N1832 Chronic kidney disease, stage 3b: Secondary | ICD-10-CM

## 2021-06-19 DIAGNOSIS — E1122 Type 2 diabetes mellitus with diabetic chronic kidney disease: Secondary | ICD-10-CM | POA: Diagnosis not present

## 2021-06-19 LAB — BASIC METABOLIC PANEL
BUN: 43 mg/dL — ABNORMAL HIGH (ref 6–23)
CO2: 30 mEq/L (ref 19–32)
Calcium: 9.7 mg/dL (ref 8.4–10.5)
Chloride: 103 mEq/L (ref 96–112)
Creatinine, Ser: 1.7 mg/dL — ABNORMAL HIGH (ref 0.40–1.50)
GFR: 37.46 mL/min — ABNORMAL LOW (ref >60.00)
Glucose, Bld: 227 mg/dL — ABNORMAL HIGH (ref 70–99)
Potassium: 4.8 mEq/L (ref 3.5–5.1)
Sodium: 140 mEq/L (ref 135–145)

## 2021-06-19 MED ORDER — AMLODIPINE BESYLATE 10 MG PO TABS: 10.0000 mg | ORAL_TABLET | Freq: Every day | ORAL | 3 refills | Status: DC

## 2021-06-19 NOTE — Assessment & Plan Note (Addendum)
Notes several months of exertional dyspnea without associated chest tightness or other cardiac symptoms. In known CAD, ?anginal equivalent vs deconditioning.  ?Poor tolerance to aspirin in the past so will remain off anti-platelet agent at this time.  ?He does have cardiology f/u planned for next month. Will see if cardiology wants to further risk stratify prior to appt.  ?

## 2021-06-19 NOTE — Progress Notes (Signed)
? ? Patient ID: Scott [name], male    DOB: [birth year], 81 y.o.   MRN: [id number] ? ?This visit was conducted in person. ? ?BP 140/78   Pulse 62   Temp 97.9 ?F (36.6 ?C) (Temporal)   Ht 5' 9.5" (1.765 m)   Wt 151 lb 2 oz (68.5 kg)   SpO2 97%   BMI 22.00 kg/m?   ? ?CC: hypertension ?Subjective:  ? ?HPI: ?Scott [name] is a 81 y.o. male presenting on 06/19/2021 for Hypertension (Here for f/u. Provided log of recent BP readings. Needs refill for amlodipine due to taking 2 tablets daily.) ? ? ?See recent phone note. He notes some exertional dyspnea over the last few months - even rolling trash bin down 100 ft driveway. No longer able to walk as long as he used to because of this. Normally previously walked 3 acres of his property - now notes he gets very tired easily. No chest pain or tightness or dizziness with this. No significant palpitations. No recent orthostatic dizziness.  ? ?HTN - Compliant with current antihypertensive regimen of amlodipine '10mg'$  daily, carvedilol 6.'25mg'$  bid, hctz 12.'5mg'$  daily. Does  check blood pressures at home and brings log: 110-140s/60-80s, HR 50-60s.  No low blood pressure readings or symptoms of dizziness/syncope.  Denies HA, vision changes, CP/tightness, leg swelling.   ? ?More recently has had episodes of vertigo associated with vomiting 04/2021 s/p reassuring ER evaluation including brain MRI - showed 2 remote R cerebellar strokes. He stopped aspirin due to nosebleeds.  ?   ? ?Relevant past medical, surgical, family and social history reviewed and updated as indicated. Interim medical history since our last visit reviewed. ?Allergies and medications reviewed and updated. ?Outpatient Medications Prior to Visit  ?Medication Sig Dispense Refill  ? acetaminophen (TYLENOL) 650 MG CR tablet Take 1,300 mg by mouth 2 (two) times daily.    ? atorvastatin (LIPITOR) 80 MG tablet TAKE 1 TABLET BY MOUTH ON MONDAY, WEDNESDAY AND FRIDAY 39 tablet 3  ? carvedilol (COREG) 6.25 MG tablet TAKE 1  TABLET BY MOUTH TWICE DAILY WITH MEALS 180 tablet 0  ? Continuous Blood Gluc Receiver (FREESTYLE LIBRE 14 DAY READER) DEVI Use as directed to check sugars QID AC HS 2 each 1  ? Continuous Blood Gluc Sensor (FREESTYLE LIBRE 14 DAY SENSOR) MISC Use as directed to check sugars QID HS 2 each 11  ? ezetimibe (ZETIA) 10 MG tablet Take 1 tablet by mouth once daily 90 tablet 0  ? hydrochlorothiazide (MICROZIDE) 12.5 MG capsule Take 1 capsule (12.5 mg total) by mouth daily. 30 capsule 3  ? hydroxyurea (HYDREA) 500 MG capsule Take 1,000 mg by mouth daily.    ? insulin detemir (LEVEMIR FLEXTOUCH) 100 UNIT/ML FlexPen Inject 14 Units into the skin daily. 15 mL 3  ? levothyroxine (SYNTHROID) 88 MCG tablet Take 1 tablet (88 mcg total) by mouth daily. 90 tablet 3  ? lipase/protease/amylase (CREON) 36000 UNITS CPEP capsule Take 2 capsules with the first bite of each meal and 1 capsule with the first bite of each snack 240 capsule 2  ? meclizine (ANTIVERT) 12.5 MG tablet Take 1 tablet (12.5 mg total) by mouth 2 (two) times daily as needed for dizziness (sedation precautions). 20 tablet 0  ? nitroGLYCERIN (NITROSTAT) 0.4 MG SL tablet Place 1 tablet (0.4 mg total) under the tongue every 5 (five) minutes as needed for chest pain ((MAX of 3 doses)). 25 tablet 0  ? NOVOLOG FLEXPEN 100 UNIT/ML FlexPen INJECT 5  TO 8 UNITS SUBCUTANEOUSLY THREE TIMES DAILY WITH MEALS 15 mL 0  ? oxymetazoline (AFRIN) 0.05 % nasal spray Place 1 spray into left nostril daily as needed (nose bleeds).    ? traZODone (DESYREL) 100 MG tablet TAKE 1 TABLET BY MOUTH AT BEDTIME AS NEEDED FOR SLEEP 90 tablet 0  ? triamcinolone ointment (KENALOG) 0.5 % Apply 1 application topically 2 (two) times daily. 30 g 1  ? amLODipine (NORVASC) 5 MG tablet Take 5 mg by mouth daily. Takes 2 tablets once a day    ? ?Facility-Administered Medications Prior to Visit  ?Medication Dose Route Frequency Provider Last Rate Last Admin  ? sodium chloride flush (NS) 0.9 % injection 10 mL  10 mL  Intravenous Once Lloyd Huger, MD      ?  ? ?Per HPI unless specifically indicated in ROS section below ?Review of Systems ? ?Objective:  ?BP 140/78   Pulse 62   Temp 97.9 ?F (36.6 ?C) (Temporal)   Ht 5' 9.5" (1.765 m)   Wt 151 lb 2 oz (68.5 kg)   SpO2 97%   BMI 22.00 kg/m?   ?Wt Readings from Last 3 Encounters:  ?06/19/21 151 lb 2 oz (68.5 kg)  ?05/30/21 152 lb 2 oz (69 kg)  ?05/17/21 148 lb 1.6 oz (67.2 kg)  ?  ?  ?Physical Exam ?Vitals and nursing note reviewed.  ?Constitutional:   ?   Appearance: Normal appearance. He is not ill-appearing.  ?Cardiovascular:  ?   Rate and Rhythm: Normal rate and regular rhythm. Occasional Extrasystoles are present. ?   Pulses: Normal pulses.  ?   Heart sounds: Normal heart sounds. No murmur heard. ?Pulmonary:  ?   Effort: Pulmonary effort is normal. No respiratory distress.  ?   Breath sounds: Normal breath sounds. No wheezing, rhonchi or rales.  ?Musculoskeletal:     ?   General: Normal range of motion.  ?   Right lower leg: No edema.  ?   Left lower leg: No edema.  ?Skin: ?   General: Skin is warm and dry.  ?   Findings: No rash.  ?Neurological:  ?   Mental Status: He is alert.  ?Psychiatric:     ?   Mood and Affect: Mood normal.     ?   Behavior: Behavior normal.  ? ?   ?Results for orders placed or performed in visit on 05/17/21  ?CBC with Differential  ?Result Value Ref Range  ? WBC 5.8 4.0 - 10.5 K/uL  ? RBC 3.87 (L) 4.22 - 5.81 MIL/uL  ? Hemoglobin 11.6 (L) 13.0 - 17.0 g/dL  ? HCT 36.5 (L) 39.0 - 52.0 %  ? MCV 94.3 80.0 - 100.0 fL  ? MCH 30.0 26.0 - 34.0 pg  ? MCHC 31.8 30.0 - 36.0 g/dL  ? RDW 12.9 11.5 - 15.5 %  ? Platelets 124 (L) 150 - 400 K/uL  ? nRBC 0.0 0.0 - 0.2 %  ? Neutrophils Relative % 69 %  ? Neutro Abs 4.0 1.7 - 7.7 K/uL  ? Lymphocytes Relative 18 %  ? Lymphs Abs 1.1 0.7 - 4.0 K/uL  ? Monocytes Relative 10 %  ? Monocytes Absolute 0.6 0.1 - 1.0 K/uL  ? Eosinophils Relative 2 %  ? Eosinophils Absolute 0.1 0.0 - 0.5 K/uL  ? Basophils Relative 1 %  ?  Basophils Absolute 0.0 0.0 - 0.1 K/uL  ? Immature Granulocytes 0 %  ? Abs Immature Granulocytes 0.02 0.00 - 0.07 K/uL  ?  Hold Tube- Blood Bank  ?Result Value Ref Range  ? Blood Bank Specimen SAMPLE AVAILABLE FOR TESTING   ? Sample Expiration    ?  05/20/2021,2359 ?Performed at [facility], [address] [place], [place] [address] ?  ? ?Lab Results  ?Component Value Date  ? CREATININE 1.61 (H) 05/14/2021  ? BUN 40 (H) 05/14/2021  ? NA 140 05/14/2021  ? K 4.7 05/14/2021  ? CL 105 05/14/2021  ? CO2 28 05/14/2021  ?GFR = 40 ? ?EKG 05/2021 - NSR rate 60s with frequent PVCs, normal axis, intervals, no acute ST/T changes.  ? ?Assessment & Plan:  ? ?Problem List Items Addressed This Visit   ? ? CAD (coronary artery disease)  ?  H/o this, latest stress test 2017 low risk for ischemia, did show apical defect.  ?Cannot tolerate aspirin due to nose bleeds and h/o chronic thrombocytopenia.  ?Continue statin, zetia.  ? ?  ?  ? Relevant Medications  ? amLODipine (NORVASC) 10 MG tablet  ? Hypertension  ?  Blood pressure control is well controlled on current regimen - continue this (recent increase in amlodipine to '10mg'$ , addition of hctz 12.'5mg'$  daily). Update BMP on diuretic.  ? ?  ?  ? Relevant Medications  ? amLODipine (NORVASC) 10 MG tablet  ? Other Relevant Orders  ? Basic metabolic panel  ? CKD (chronic kidney disease) stage 3, GFR 30-59 ml/min (HCC)  ?  Contributes to complexity of care.  ? ?  ?  ? Type 2 diabetes mellitus with diabetic chronic kidney disease ([place])  ?  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 ? ?  ?  ? Exertional dyspnea - Primary  ?  Notes several months of exertional dyspnea without associated chest tightness or other cardiac symptoms. In known CAD, ?anginal equivalent vs deconditioning.  ?Poor tolerance to aspirin in the past so will remain off anti-platelet agent at this time.   ?He does have cardiology f/u planned for next month. Will see if cardiology wants to further risk stratify prior to appt.  ? ?  ?  ?  ? ?Meds ordered this encounter  ?Medications  ? amLODipine (NORVASC) 10 MG table

## 2021-06-19 NOTE — Assessment & Plan Note (Addendum)
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 ?

## 2021-06-19 NOTE — Assessment & Plan Note (Signed)
H/o this, latest stress test 2017 low risk for ischemia, did show apical defect.  ?Cannot tolerate aspirin due to nose bleeds and h/o chronic thrombocytopenia.  ?Continue statin, zetia.  ?

## 2021-06-19 NOTE — Assessment & Plan Note (Addendum)
Blood pressure control is well controlled on current regimen - continue this (recent increase in amlodipine to '10mg'$ , addition of hctz 12.'5mg'$  daily). Update BMP on diuretic.  ?

## 2021-06-19 NOTE — Patient Instructions (Addendum)
I've refilled amlodipine to take '10mg'$  daily. Continue other medicines.  ?Labs today.  ?Keep appointment with Dr Rockey Situ for next month.  ?

## 2021-06-19 NOTE — Assessment & Plan Note (Signed)
Contributes to complexity of care.  ?

## 2021-06-20 ENCOUNTER — Ambulatory Visit (INDEPENDENT_AMBULATORY_CARE_PROVIDER_SITE_OTHER): Payer: Medicare Other | Admitting: Pharmacist

## 2021-06-20 ENCOUNTER — Telehealth: Payer: Self-pay | Admitting: Emergency Medicine

## 2021-06-20 DIAGNOSIS — E1169 Type 2 diabetes mellitus with other specified complication: Secondary | ICD-10-CM

## 2021-06-20 DIAGNOSIS — K8681 Exocrine pancreatic insufficiency: Secondary | ICD-10-CM

## 2021-06-20 DIAGNOSIS — I25118 Atherosclerotic heart disease of native coronary artery with other forms of angina pectoris: Secondary | ICD-10-CM

## 2021-06-20 DIAGNOSIS — I2510 Atherosclerotic heart disease of native coronary artery without angina pectoris: Secondary | ICD-10-CM

## 2021-06-20 DIAGNOSIS — E1122 Type 2 diabetes mellitus with diabetic chronic kidney disease: Secondary | ICD-10-CM

## 2021-06-20 DIAGNOSIS — E039 Hypothyroidism, unspecified: Secondary | ICD-10-CM

## 2021-06-20 DIAGNOSIS — E785 Hyperlipidemia, unspecified: Secondary | ICD-10-CM

## 2021-06-20 DIAGNOSIS — N1832 Chronic kidney disease, stage 3b: Secondary | ICD-10-CM

## 2021-06-20 DIAGNOSIS — Z794 Long term (current) use of insulin: Secondary | ICD-10-CM

## 2021-06-20 NOTE — Telephone Encounter (Signed)
Called patient. No answer. Lmtcb.  

## 2021-06-20 NOTE — Telephone Encounter (Signed)
-----   Message from Minna Merritts, MD sent at 06/19/2021  5:23 PM EDT ----- ?Concern from primary care of symptoms as detailed below ?He has known coronary artery disease, scheduled to see me in May ?We can request/suggest that he will probably need ischemic work-up,  ?we could order a Myoview sometime between now and when I need to see him to rule out ischemia ?If he agrees, we can order the test, and can discuss the results when he comes into clinic ?Thx ?TG ? ?----- Message ----- ?From: [name], MD ?Sent: 06/19/2021   1:50 PM EDT ?To: Minna Merritts, MD ? ?Hey Dr Rockey Situ, ?Thanks for your note. Hope you're well.  ?Jerzy has been experiencing exertional dyspnea and easy fatigability for several months now. Fortunately no chest tightness/pressure but he notes his stamina has been affected.  ?I think he may benefit from further risk stratification.  ?He is scheduled to see you May 19th. ?BPs are better controlled on current regimen.  ?He has trouble tolerating aspirin.  ?Thanks! ?-Garlon Hatchet ? ?

## 2021-06-20 NOTE — Progress Notes (Signed)
? ?Chronic Care Management ?Pharmacy Note ? ?06/29/2021 ?Name:  Scott [name] MRN:  [id number] DOB:  [birth year] ? ?Summary: CCM Initial visit ?-Pt endorses compliance with medications as prescribed ?-A1c 8.6 (05/2021); pt is worried about noctural hypoglycemia, he will eat a snack at night to prevent this leading to high AM sugars; he is currently using 1st gen [employer] 14-day which does not have alarms for low sugars; he is also paying out of pocket at the pharmacy for this - Medicare should pay for this through DME since he is on insulin ? ?Recommendations/Changes made from today's visit: ?-Freestyle Libre 2 ordered through [employer] to [employer] ?-Move Levemir to AM to avoid nocturnal hypoglycemia ? ?Plan: ?-[place] will call patient 1 month for DM update ?-Pharmacist follow up televisit scheduled for 3 months ?-PCP F/U 08/10/21 ? ? ?Subjective: ?Scott [name] is an 81 y.o. year old male who is a primary patient of [name], MD.  The CCM team was consulted for assistance with disease management and care coordination needs.   ? ?Engaged with patient by telephone for initial visit in response to provider referral for pharmacy case management and/or care coordination services.  ? ?Consent to Services:  ?The patient was given the following information about Chronic Care Management services today, agreed to services, and gave verbal consent: 1. CCM service includes personalized support from designated clinical staff supervised by the primary care provider, including individualized plan of care and coordination with other care providers 2. 24/7 contact phone numbers for assistance for urgent and routine care needs. 3. Service will only be billed when office clinical staff spend 20 minutes or more in a month to coordinate care. 4. Only one practitioner may furnish and bill the service in a calendar month. 5.The patient may stop CCM services at any time (effective at the end of the  month) by phone call to the office staff. 6. The patient will be responsible for cost sharing (co-pay) of up to 20% of the service fee (after annual deductible is met). Patient agreed to services and consent obtained. ? ?Patient Care Team: ?[name], MD as PCP - General (Family Medicine) ?Dorothy Spark, MD as PCP - Cardiology (Cardiology) ?Lloyd Huger, MD as Consulting Physician (Oncology) ?Clent Jacks, MD as Consulting Physician (Ophthalmology) ?Dorothy Spark, MD as Consulting Physician (Cardiology) ?Madelon Lips, MD as Consulting Physician (Nephrology) ?Solan Vosler, [facility], [facility] as Pharmacist (Pharmacist) ? ?Recent office visits: ?06/06/21 phone note - c/o swelling on 10 mg amlodipine. Reduce amlodipien to 5 mg, start HCTZ 12.5 mg daily. ? ?05/30/21 NP Romilda Garret OV: upper respiratory tract infection-negative covid -treat with doxycycline 100 mg twice daily for 7 days. ?  ?05/25/21 pt message - BP high off lisinopril. Increase amlodipine to 10 mg. ? ?05/09/21-PCP-Javier Gutierrez,MD-AWV- Labs ordered (Your potassium returned normal off lisinopril - stay off this medication.) New A1c 8.6- Referral to Endocrinology-check pharmacy for shingrix series ? ?Recent consult visits: ?05/25/21-[facility] [place]- elevated blood pressure- He recently stopped taking lisinopril(hyperkalemia)Labs,EKG,follow up PCP ? ?05/17/21-[facility]-Timothy Finnegan,MD-Follow up Leukemia-labs ordered-Patient will require premedications for any further infusions with Rituxan ? ?04/03/21 Dr Precious Reel (GI): f/u pancreatic insufficiency. Continue creon. Weight loss may be 2/2 poorly controlled DM. ? ?03/06/21 Dr Katy Fitch (eye)- eye exam ? ?Hospital visits: ?None in previous 6 months ? ? ?Objective: ? ?Lab Results  ?Component Value Date  ? CREATININE 1.70 (H) 06/19/2021  ? BUN 43 (H) 06/19/2021  ? GFR 37.46 (L)  06/19/2021  ? GFRNONAA 39 (L) 04/28/2021  ? GFRAA 42 (L) 10/28/2019  ? NA 140 06/19/2021  ? K 4.8  06/19/2021  ? CALCIUM 9.7 06/19/2021  ? CO2 30 06/19/2021  ? GLUCOSE 227 (H) 06/19/2021  ? ? ?Lab Results  ?Component Value Date/Time  ? HGBA1C 8.6 (H) 05/09/2021 10:22 AM  ? HGBA1C 8.0 (A) 12/18/2020 10:22 AM  ? HGBA1C 7.9 (A) 08/18/2020 10:40 AM  ? HGBA1C 8.0 (H) 04/12/2020 09:01 AM  ? FRUCTOSAMINE 348 (H) 02/22/2019 10:07 AM  ? FRUCTOSAMINE 392 (H) 06/24/2018 10:15 AM  ? GFR 37.46 (L) 06/19/2021 12:59 PM  ? GFR 40.02 (L) 05/14/2021 10:10 AM  ? MICROALBUR 29.0 (H) 02/22/2019 10:07 AM  ? MICROALBUR 5.6 (H) 03/27/2018 09:45 AM  ?  ?Last diabetic Eye exam:  ?Lab Results  ?Component Value Date/Time  ? HMDIABEYEEXA No Retinopathy 03/06/2021 12:00 AM  ?  ?Last diabetic Foot exam: No results found for: HMDIABFOOTEX  ? ?Lab Results  ?Component Value Date  ? CHOL 131 05/09/2021  ? HDL 44.60 05/09/2021  ? [place] 99 04/12/2020  ? LDLDIRECT 69.0 05/09/2021  ? TRIG 255.0 (H) 05/09/2021  ? CHOLHDL 3 05/09/2021  ? ? ? ?  Latest Ref Rng & Units 05/09/2021  ? 10:22 AM 04/28/2021  ?  6:22 AM 02/15/2021  ?  9:52 AM  ?Hepatic Function  ?Total Protein 6.5 - 8.1 g/dL  6.9   6.7    ?Albumin 3.5 - 5.2 g/dL 4.4   4.1   4.2    ?AST 15 - 41 U/L  34   39    ?ALT 0 - 44 U/L  42   48    ?Alk Phosphatase 38 - 126 U/L  89   82    ?Total Bilirubin 0.3 - 1.2 mg/dL  0.7   0.6    ? ? ?Lab Results  ?Component Value Date/Time  ? TSH 3.37 05/09/2021 10:22 AM  ? TSH 1.80 04/12/2020 09:01 AM  ? FREET4 1.17 05/11/2019 11:32 AM  ? FREET4 0.87 02/22/2019 10:07 AM  ? ? ? ?  Latest Ref Rng & Units 05/17/2021  ? 10:05 AM 04/28/2021  ?  6:22 AM 02/15/2021  ?  9:52 AM  ?CBC  ?WBC 4.0 - 10.5 K/uL 5.8   7.1   3.9    ?Hemoglobin 13.0 - 17.0 g/dL 11.6   11.3   11.3    ?Hematocrit 39.0 - 52.0 % 36.5   35.8   35.7    ?Platelets 150 - 400 K/uL 124   120   109    ? ? ?Lab Results  ?Component Value Date/Time  ? VD25OH 48.08 05/09/2021 10:22 AM  ? VD25OH 41.62 04/12/2020 09:01 AM  ? ? ?Clinical ASCVD: Yes  ?The ASCVD Risk score (Arnett DK, et al., 2019) failed to calculate  for the following reasons: ?  The 2019 ASCVD risk score is only valid for ages 58 to 26   ? ? ?  05/09/2021  ?  9:32 AM 04/11/2020  ? 12:00 PM 02/22/2019  ?  2:14 PM  ?Depression screen PHQ 2/9  ?Decreased Interest 0 0 0  ?Down, Depressed, Hopeless 0 0 0  ?PHQ - 2 Score 0 0 0  ?Altered sleeping  0 0  ?Tired, decreased energy  0 0  ?Change in appetite  0 0  ?Feeling bad or failure about yourself   0 0  ?Trouble concentrating  0 0  ?Moving slowly or fidgety/restless  0 0  ?  Suicidal thoughts  0 0  ?PHQ-9 Score  0 0  ?Difficult doing work/chores  Not difficult at all Not difficult at all  ?  ? ?Social History  ? ?Tobacco Use  ?Smoking Status Former  ? Types: Cigarettes  ? Quit date: 03/04/1978  ? Years since quitting: 43.3  ?Smokeless Tobacco Never  ? ?BP Readings from Last 3 Encounters:  ?06/19/21 140/78  ?05/30/21 (!) 152/60  ?05/25/21 123/81  ? ?Pulse Readings from Last 3 Encounters:  ?06/19/21 62  ?05/30/21 (!) 56  ?05/25/21 64  ? ?Wt Readings from Last 3 Encounters:  ?06/19/21 151 lb 2 oz (68.5 kg)  ?05/30/21 152 lb 2 oz (69 kg)  ?05/17/21 148 lb 1.6 oz (67.2 kg)  ? ?BMI Readings from Last 3 Encounters:  ?06/19/21 22.00 kg/m?  ?05/30/21 22.14 kg/m?  ?05/17/21 21.56 kg/m?  ? ? ?Assessment/Interventions: Review of patient past medical history, allergies, medications, health status, including review of consultants reports, laboratory and other test data, was performed as part of comprehensive evaluation and provision of chronic care management services.  ? ?SDOH:  (Social Determinants of Health) assessments and interventions performed: Yes ?SDOH Interventions   ? ?Flowsheet Row Most Recent Value  ?SDOH Interventions   ?Food Insecurity Interventions Intervention Not Indicated  ?Financial Strain Interventions Intervention Not Indicated, Other (Comment)  [Switched CMG to DME so insurance will cover it]  ? ?  ? ?SDOH Screenings  ? ?Alcohol Screen: Not on file  ?Depression (PHQ2-9): Low Risk   ? PHQ-2 Score: 0  ?Financial  Resource Strain: Low Risk   ? Difficulty of Paying Living Expenses: Not very hard  ?Food Insecurity: No Food Insecurity  ? Worried About Charity fundraiser in the Last Year: Never true  ? Ran Out of Food in the Last Y

## 2021-06-21 NOTE — Telephone Encounter (Signed)
Thank you! Appreciate care of all involved!  ?

## 2021-06-21 NOTE — Telephone Encounter (Signed)
Attempted to schedule.  

## 2021-06-21 NOTE — Telephone Encounter (Signed)
Spoke with patient.  ? ?Explained that Dr. Rockey Situ would like to do ischemic workup prior to his next visit in May, so we would have results. Patient agreeable to having lexiscan myoview done.  ? ?Orders placed and instructions gone over with patient. Advised patient that I will also send him instructions via mychart. Pt verbalized understanding of everything discussed and any questions were answered.  ? ?Will send message to scheduling to have myoview scheduled.  ?

## 2021-06-21 NOTE — Addendum Note (Signed)
Addended by: Mila Merry on: 06/21/2021 10:21 AM ? ? Modules accepted: Orders ? ?

## 2021-06-22 ENCOUNTER — Telehealth: Payer: Self-pay

## 2021-06-22 ENCOUNTER — Telehealth: Payer: Self-pay | Admitting: Family Medicine

## 2021-06-22 NOTE — Telephone Encounter (Signed)
Pt is returning Lisa's call from this morning. Would like to talk with Scott [name] when she is available.  ?

## 2021-06-22 NOTE — Telephone Encounter (Signed)
Faxed order

## 2021-06-22 NOTE — Telephone Encounter (Signed)
Received faxed order from [place] for FreeStyle Libre 2 Reader/sensor. ? ?Lvm asking pt to call back.  Need to know if pt initiated or is aware of order through this company. ? ?[Form is in basket on Lisa's desk.] ?

## 2021-06-22 NOTE — Telephone Encounter (Signed)
Filled and in Lisa's box 

## 2021-06-22 NOTE — Telephone Encounter (Signed)
Per Minette Brine, pt rtn call (see other phn note- 06/22/21). ? ?Spoke with pt asking about form.  Pt confirms he is aware of the order through [facility].  States the [employer] initiated order. ? ?Placed form in Dr. Synthia Innocent box.  ?

## 2021-06-22 NOTE — Telephone Encounter (Signed)
See other phn message from 06/22/21.  ?

## 2021-06-22 NOTE — Addendum Note (Signed)
Addended by: Mila Merry on: 06/22/2021 03:22 PM ? ? Modules accepted: Orders ? ?

## 2021-06-27 ENCOUNTER — Encounter: Payer: Self-pay | Admitting: Oncology

## 2021-06-27 NOTE — Telephone Encounter (Signed)
Called and spoke with patient. Went over instructions for [employer] again. Pt verbalized understanding and voiced appreciation for the call.  ?

## 2021-06-28 ENCOUNTER — Encounter
Admission: RE | Admit: 2021-06-28 | Discharge: 2021-06-28 | Disposition: A | Discharge status: Home / Self Care | Payer: Medicare Other | Source: Ambulatory Visit | Attending: Cardiovascular Disease | Admitting: Cardiovascular Disease

## 2021-06-28 DIAGNOSIS — I25118 Atherosclerotic heart disease of native coronary artery with other forms of angina pectoris: Secondary | ICD-10-CM

## 2021-06-28 LAB — NM MYOCAR MULTI W/SPECT W/WALL MOTION / EF
LV dias vol: 103 mL (ref 62–150)
LV sys vol: 33 mL
MPHR: 139 {beats}/min
Nuc Stress EF: 68 %
Peak HR: 80 {beats}/min
Percent HR: 57 %
Rest HR: 63 {beats}/min
Rest Nuclear Isotope Dose: 9.5 mCi
SDS: 1
SRS: 5
SSS: 5
ST Depression (mm): 0 mm
Stress Nuclear Isotope Dose: 29.4 mCi
TID: 0.9

## 2021-06-28 MED ORDER — TECHNETIUM TC 99M TETROFOSMIN IV KIT
9.5000 | PACK | Freq: Once | INTRAVENOUS | Status: AC | PRN
  Administered 2021-06-28: 9.5 via INTRAVENOUS

## 2021-06-28 MED ORDER — TECHNETIUM TC 99M TETROFOSMIN IV KIT
29.3800 | PACK | Freq: Once | INTRAVENOUS | Status: AC | PRN
  Administered 2021-06-28: 29.38 via INTRAVENOUS

## 2021-06-28 MED ORDER — REGADENOSON 0.4 MG/5ML IV SOLN
0.4000 mg | Freq: Once | INTRAVENOUS | Status: AC
Start: 2021-06-28 — End: 2021-06-28
  Administered 2021-06-28: 0.4 mg via INTRAVENOUS

## 2021-06-29 ENCOUNTER — Telehealth: Payer: Self-pay | Admitting: Emergency Medicine

## 2021-06-29 NOTE — Telephone Encounter (Signed)
-----   Message from Minna Merritts, MD sent at 06/28/2021  6:23 PM EDT ----- ?Stress test ?No significant ischemia that would warrant cardiac catheterization ?Would keep [place] apprised of any new symptoms ? ?

## 2021-06-29 NOTE — Patient Instructions (Signed)
Visit Information ? ?Phone number for Pharmacist: [phone] ? ?Thank you for meeting with me to discuss your medications! I look forward to working with you to achieve your health care goals. Below is a summary of what we talked about during the visit: ? ? Goals Addressed   ?None ?  ? ? ?Care Plan : [place]  ?Updates made by [name], [place] since 06/29/2021 12:00 AM  ?  ? ?Problem: Hypertension, Hyperlipidemia, Diabetes, and Coronary Artery Disease   ?Priority: High  ?  ? ?Long-Range Goal: Disease mgmt   ?Start Date: 06/29/2021  ?Expected End Date: 06/30/2022  ?This Visit's Progress: On track  ?Priority: High  ?Note:   ?Current Barriers:  ?Suboptimal CGM version ? ?Pharmacist Clinical Goal(s):  ?Patient will adhere to plan to optimize therapeutic regimen for DM as evidenced by report of adherence to recommended medication management changes through collaboration with PharmD and provider.  ? ?Interventions: ?1:1 collaboration with [name], MD regarding development and update of comprehensive plan of care as evidenced by provider attestation and co-signature ?Inter-disciplinary care team collaboration (see longitudinal plan of care) ?Comprehensive medication review performed; medication list updated in electronic medical record ? ?Hypertension (BP goal <140/90) ?-Controlled - home BP at goal ?-Current home BP readings: 110-140s/60-80s, HR 50-60s ?-Current treatment: ?Carvedilol 6.25 mg BID - Appropriate, Effective, Safe, Accessible ?HCTZ 12.5 mg daily - Appropriate, Effective, Safe, Accessible ?Amlodipine 10 mg daily - Appropriate, Effective, Safe, Accessible ?-Medications previously tried: lisinopril (hyperK) ?-Educated on BP goals and benefits of medications for prevention of heart attack, stroke and kidney damage; ?-Counseled to monitor BP at home daily ?-Recommended to continue current medication ? ?Hyperlipidemia / CAD: (LDL goal < 70) ?-Controlled - LDL 69 (05/2021) at goal ?-Hx  CAD. Intolerant to aspirin (nosebleeds requiring ED visits) ?-Current treatment: ?Atorvastatin 80 mg MWF - Appropriate, Effective, Safe, Accessible ?Ezetimibe 10 mg daily - Appropriate, Effective, Safe, Accessible ?-Medications previously tried: n/a  ?-Educated on Cholesterol goals; Benefits of statin for ASCVD risk reduction; ?-Recommended to continue current medication ? ?Diabetes (A1c goal <8%; TIR > 70%) ?-Not ideally controlled - A1c 8.6% (05/2021); CGM TIR 45% with FL-14 day; he is worried about low sugars overnight, he will sometimes eat a snack at night to prevent this leading to high AM readings ?-pt is using Freestyle Libre (1st gen) without alarms, he is paying $81 per month at the pharmacy ?-Upcoming Endo appt with Dr [name] ([facility]) ?-If BG is > 200 he will take Novolog 6 units; ?-Current home glucose readings: TIR: 70-180 45% ? 6pm: 187 ? 7pm: 156 ? 8am: 148 ? 8:13: 162 ? 10am: 179 -ate breakfast, took Novolog 5 units ? 1:37: 303 ?-Current medications: ?Levemir 14 units daily HS - Appropriate, Effective, Query Safe (noctural hypoglycemia) ?Novolog 5-8 units TID w/ meals - Appropriate, Effective, Safe, Accessible ?Freestyle Libre 14 day ($81 per month) ?-Medications previously tried: n/a ?-Denies hypoglycemic/hyperglycemic symptoms ?-Current meal patterns: eats egg, toast, bacon - sugar jumped 200 pts ?-Educated on A1c and blood sugar goals; Complications of diabetes including kidney damage, retinal damage, and cardiovascular disease; Continuous glucose monitoring; ?-Discussed he is able to upgrade to [facility] 2 - improved accuracy and alarms for high/low sugars; it should be covered by Medicare since he is on multiple daily insulin injections ?-Recommended to continue current medication; ordered FL2 to [facility] via [facility] ? ?Hypothyroidism (Goal: maintain TSH in goal range) ?-Controlled - TSH within goal range ?-Current treatment  ?Levothyroxine 88  mcg daily - Appropriate,  Effective, Safe, Accessible ?-Medications previously tried: n/a  ?-Recommended to continue current medication ? ?Pancreatic insufficiency (Goal: manage symptoms) ?-Controlled - per pt report; he is getting Creon through PAP ?-Current treatment  ?Creon 36000 unit - 2 cap w/ meals, 1 cap w/ snacks (PAP) - Appropriate, Effective, Safe, Accessible ?-Medications previously tried: n/a  ?-Recommended to continue current medication ? ?Health Maintenance ?-Vaccine gaps: none ?-Hx CLL. Following with oncology. S/p Rituxan 2020. ?-Current therapy:  ?Trazodone 100 mg HS prn sleep ?Afrin nasal spray PRN ?Tylenol 650 mg PRN ?Hydroxyurea 500 mg daily - not taking. Removed from list. ?Meclizine 12.5 mg PRN ?Multivitamin (Centrum Silver Mens 50+) ?-Patient is satisfied with current therapy and denies issues ?-Recommended to continue current medication ? ?Patient Goals/Self-Care Activities ?Patient will:  ?- take medications as prescribed as evidenced by patient report and record review ?focus on medication adherence by pill box ?check glucose w/ Freestyle Libre, document, and provide at future appointments ?check blood pressure daily, document, and provide at future appointments ? ?  ?  ? ?Scott [name] was given information about Chronic Care Management services today including:  ?CCM service includes personalized support from designated clinical staff supervised by his physician, including individualized plan of care and coordination with other care providers ?24/7 contact phone numbers for assistance for urgent and routine care needs. ?Standard insurance, coinsurance, copays and deductibles apply for chronic care management only during months in which we provide at least 20 minutes of these services. Most insurances cover these services at 100%, however patients may be responsible for any copay, coinsurance and/or deductible if applicable. This service may help you avoid the need for more expensive face-to-face services. ?Only one  practitioner may furnish and bill the service in a calendar month. ?The patient may stop CCM services at any time (effective at the end of the month) by phone call to the office staff. ? ?Patient agreed to services and verbal consent obtained.  ? ?Patient verbalizes understanding of instructions and care plan provided today and agrees to view in [place]. Active MyChart status confirmed with patient.   ?Telephone follow up appointment with pharmacy team member scheduled for: 3 months ? ?Charlene Brooke, PharmD, BCACP ?Clinical Pharmacist ?[facility] ?[phone]  ?

## 2021-06-29 NOTE — Telephone Encounter (Signed)
Spoke with patient. Results reviewed with patient, pt verbalized understanding,  questions (if any) answered.   

## 2021-06-29 NOTE — Telephone Encounter (Signed)
Called patient to go over results. No answer. Lmtcb. 

## 2021-07-01 DIAGNOSIS — E1122 Type 2 diabetes mellitus with diabetic chronic kidney disease: Secondary | ICD-10-CM | POA: Diagnosis not present

## 2021-07-01 DIAGNOSIS — N1832 Chronic kidney disease, stage 3b: Secondary | ICD-10-CM

## 2021-07-01 DIAGNOSIS — I2510 Atherosclerotic heart disease of native coronary artery without angina pectoris: Secondary | ICD-10-CM

## 2021-07-01 DIAGNOSIS — E1169 Type 2 diabetes mellitus with other specified complication: Secondary | ICD-10-CM

## 2021-07-01 DIAGNOSIS — Z794 Long term (current) use of insulin: Secondary | ICD-10-CM | POA: Diagnosis not present

## 2021-07-01 DIAGNOSIS — E039 Hypothyroidism, unspecified: Secondary | ICD-10-CM

## 2021-07-01 DIAGNOSIS — E785 Hyperlipidemia, unspecified: Secondary | ICD-10-CM | POA: Diagnosis not present

## 2021-07-02 ENCOUNTER — Telehealth: Payer: Self-pay

## 2021-07-02 NOTE — Chronic Care Management (AMB) (Signed)
? ? ?Chronic Care Management ?Pharmacy Assistant  ? ?Name: Scott [name]  MRN: [id number] DOB: Jul 09, 1940 ? ? ?Reason for Encounter:Diabetes  Disease State ?  ?Recent office visits:  ?06/19/21-Javier Gutierrez,MD(PCP)-f/u HTN,-refilled amlodipine to take '10mg'$  daily.New labs(Your sugar levels are elevated - but no medicine changes at this time.Your kidneys returned impaired but stable.Your potassium levels and other electrolytes were normal. ...) ? ?Recent consult visits:  ?None since last CCM contact ? ?Hospital visits:  ?None in previous 6 months ? ?Medications: ?Outpatient Encounter Medications as of 07/02/2021  ?Medication Sig  ? acetaminophen (TYLENOL) 650 MG CR tablet Take 1,300 mg by mouth 2 (two) times daily.  ? amLODipine (NORVASC) 10 MG tablet Take 1 tablet (10 mg total) by mouth daily.  ? atorvastatin (LIPITOR) 80 MG tablet TAKE 1 TABLET BY MOUTH ON MONDAY, WEDNESDAY AND FRIDAY  ? carvedilol (COREG) 6.25 MG tablet TAKE 1 TABLET BY MOUTH TWICE DAILY WITH MEALS  ? ezetimibe (ZETIA) 10 MG tablet Take 1 tablet by mouth once daily  ? hydrochlorothiazide (MICROZIDE) 12.5 MG capsule Take 1 capsule (12.5 mg total) by mouth daily.  ? insulin detemir (LEVEMIR FLEXTOUCH) 100 UNIT/ML FlexPen Inject 14 Units into the skin daily. (Patient taking differently: Inject 10-14 Units into the skin daily.)  ? levothyroxine (SYNTHROID) 88 MCG tablet Take 1 tablet (88 mcg total) by mouth daily.  ? lipase/protease/amylase (CREON) 36000 UNITS CPEP capsule Take 2 capsules with the first bite of each meal and 1 capsule with the first bite of each snack  ? meclizine (ANTIVERT) 12.5 MG tablet Take 1 tablet (12.5 mg total) by mouth 2 (two) times daily as needed for dizziness (sedation precautions).  ? nitroGLYCERIN (NITROSTAT) 0.4 MG SL tablet Place 1 tablet (0.4 mg total) under the tongue every 5 (five) minutes as needed for chest pain ((MAX of 3 doses)).  ? NOVOLOG FLEXPEN 100 UNIT/ML FlexPen INJECT 5 TO 8 UNITS SUBCUTANEOUSLY THREE  TIMES DAILY WITH MEALS  ? oxymetazoline (AFRIN) 0.05 % nasal spray Place 1 spray into left nostril daily as needed (nose bleeds).  ? traZODone (DESYREL) 100 MG tablet TAKE 1 TABLET BY MOUTH AT BEDTIME AS NEEDED FOR SLEEP  ? triamcinolone ointment (KENALOG) 0.5 % Apply 1 application topically 2 (two) times daily.  ? ?Facility-Administered Encounter Medications as of 07/02/2021  ?Medication  ? sodium chloride flush (NS) 0.9 % injection 10 mL  ? ? ? ? ?Recent Relevant Labs: ?Lab Results  ?Component Value Date/Time  ? HGBA1C 8.6 (H) 05/09/2021 10:22 AM  ? HGBA1C 8.0 (A) 12/18/2020 10:22 AM  ? HGBA1C 7.9 (A) 08/18/2020 10:40 AM  ? HGBA1C 8.0 (H) 04/12/2020 09:01 AM  ? MICROALBUR 29.0 (H) 02/22/2019 10:07 AM  ? MICROALBUR 5.6 (H) 03/27/2018 09:45 AM  ?  ?Kidney Function ?Lab Results  ?Component Value Date/Time  ? CREATININE 1.70 (H) 06/19/2021 12:59 PM  ? CREATININE 1.61 (H) 05/14/2021 10:10 AM  ? CREATININE 1.30 (H) 12/13/2015 08:59 AM  ? CREATININE 1.9 09/04/2011 12:00 AM  ? GFR 37.46 (L) 06/19/2021 12:59 PM  ? GFRNONAA 39 (L) 04/28/2021 06:22 AM  ? GFRAA 42 (L) 10/28/2019 10:07 AM  ? ? ? ?Contacted patient on 07/02/21 to discuss diabetes disease state.  ? ?Current antihyperglycemic regimen:  ?Levemir 14 units daily  am  ?Novolog 5-8 units TID w/ meals  ?Freestyle Libre 14 day ?  Patient verbally confirms he is taking the above medications as directed. Yes ? ?What diet changes have been made to improve diabetes control? Patient  reports eating smaller portions for meals, maybe 1 oatmeal cookie from time to time , no other sweets ? ?What recent interventions/DTPs have been made to improve glycemic control:  ?ordered FL2 to [facility] via [facility]-Patient inquiring if he will need to reorder supplies or will he be on auto-refill?  And he is getting better BG readings after switching his Levemir to am injection  ? ?Have there been any recent hospitalizations or ED visits since last visit with CPP? No ? ?Patient  denies hypoglycemic symptoms, including Pale, Sweaty, Shaky, Hungry, Nervous/irritable, and Vision changes ? ?Patient denies hyperglycemic symptoms, including blurry vision, excessive thirst, fatigue, polyuria, and weakness ? ?How often are you checking your blood sugar? 3-4 times daily ? ?What are your blood sugars ranging?  ?Fasting: 103,013,143, ?After meals:  2 hours after - [phone] ? ?During the week, how often does your blood glucose drop below 70? Never ? ?Are you checking your feet daily/regularly? Yes ? ?Adherence Review: ?Is the patient currently on a STATIN medication? Yes ?Is the patient currently on ACE/ARB medication? No ?Does the patient have >5 day gap between last estimated fill dates? No ? ?Care Gaps: ?Annual wellness visit in last year? Yes ?Most recent A1C reading:8.6  05/09/21 ?Most Recent BP reading:140/78  62-P ? ?Last eye exam / retinopathy screening:UTD ?Last diabetic foot exam:UTD ? ?Counseled patient on importance of annual eye and foot exam. UTD  ? ?Star Rating Drugs:  ?Medication:  Last Fill: Day Supply ?Atorvastatin '80mg'$  05/27/21 84 ?Levemir  05/27/21 108     ?Novolog  05/31/21 62 ? ? ?PCP appointment on 08/10/21, CCM appointment on 09/21/21, and Cardiology appointment on 07/20/21 ? ?Charlene Brooke, CPP notified ? ?Zahki Hoogendoorn, CCMA ?Health concierge  ?[phone] ?

## 2021-07-04 NOTE — Telephone Encounter (Signed)
I reached out to [facility] regarding their refill process for [facility]. Response below: ? ?"We will call patient monthly and have orders approved. We have to speak with patient monthly and have the ok to ship before we send. Patients next DOS will be for 07/25/21. We call up to 10 days before order is due for approval. If patient has placed his last sensor on and has not yet heard from [place] he is always welcome to reach out to [place] as well at [phone]" ?

## 2021-07-05 NOTE — Telephone Encounter (Signed)
Contacted the patient. Explained the refill process and the company policy of speaking to the patient.He will await the phone call for reorders of Freestyle [place] supplies. ? ?Charlene Brooke, CPP notified ? ?Rosan Calbert, CCMA ?Health concierge  ?[phone]  ?

## 2021-07-09 ENCOUNTER — Ambulatory Visit (INDEPENDENT_AMBULATORY_CARE_PROVIDER_SITE_OTHER): Payer: Medicare Other | Admitting: Cardiovascular Disease

## 2021-07-09 ENCOUNTER — Encounter: Payer: Self-pay | Admitting: Cardiovascular Disease

## 2021-07-09 VITALS — BP 110/60 | HR 64 | Ht 71.0 in | Wt 154.0 lb

## 2021-07-09 DIAGNOSIS — Z794 Long term (current) use of insulin: Secondary | ICD-10-CM

## 2021-07-09 DIAGNOSIS — I6523 Occlusion and stenosis of bilateral carotid arteries: Secondary | ICD-10-CM

## 2021-07-09 DIAGNOSIS — E1122 Type 2 diabetes mellitus with diabetic chronic kidney disease: Secondary | ICD-10-CM

## 2021-07-09 DIAGNOSIS — I25118 Atherosclerotic heart disease of native coronary artery with other forms of angina pectoris: Secondary | ICD-10-CM | POA: Diagnosis not present

## 2021-07-09 DIAGNOSIS — R5383 Other fatigue: Secondary | ICD-10-CM | POA: Diagnosis not present

## 2021-07-09 DIAGNOSIS — I10 Essential (primary) hypertension: Secondary | ICD-10-CM | POA: Diagnosis not present

## 2021-07-09 DIAGNOSIS — N1832 Chronic kidney disease, stage 3b: Secondary | ICD-10-CM

## 2021-07-09 MED ORDER — EZETIMIBE 10 MG PO TABS: 10.0000 mg | ORAL_TABLET | Freq: Every day | ORAL | 3 refills | Status: DC

## 2021-07-09 NOTE — Progress Notes (Signed)
Cardiology Office Note ? ?Date:  07/09/2021  ? ?ID:  Scott [name], DOB Nov 16, 1940, MRN [id number] ? ?PCP:  [name], MD  ? ?Chief Complaint  ?Patient presents with  ? Follow-up  ?  Discuss Myoview results & has shortness of breath with over exertion.   ? ? ?HPI:  ?Scott [name] is a 81 y.o. male with history of  ?CAD s/p MI s/p remote PTCA (Dx-OM2 proximal concentric stenosis),  ?Diabetes with peripheral nephropathy,  ?CKD stage III (sees [facility]),  ?bradycardia,  ?CLL,  ?carotid artery disease by duplex 03/2018 (6-37% RICA, 85-88% LICA),  ?chronic mild edema, HTN, orthostasis  ?who presents for routine follow-up of his coronary disease ? ?Last seen by myself in clinic April 2022 ?Reported having some shortness of breath, stress test was ordered ?Stress test with no significant ischemia, ejection fraction 47% in the setting of GI uptake artifact ? ?Works on the farm, long days, gets tired ?Uses the tractor ?Planted a garden today ? ?Myoview: no significant ischemia ?Results discussed on today's visit ? ?Followed by Dr. [name] for his leukemia, hemoglobin stable ? ?Reports that he lost his wife 6 years ago, has a friend that helps take care of him, helps to cook ? ?Tolerating Lipitor and Zetia, cholesterol at goal ? ?EKG personally reviewed by myself on todays visit ?Normal sinus rhythm with PVCs no significant ST-T wave changes ? ?Other past medical history reviewed ?A1C 8.0 ?Total chol 179, LDL 99, was off lipitor at the time (80) ? ?Carotid 05/2020 ?Carotid US 07/275: RICA 4-12%, LICA 87-86% stenosis,  ?  ?Remote PTCA 1994, chronic reocclusion distal right with collaterals from circumflex, and 08/24/1993 PTCA of Dx-OM2 proximal concentric stenosis. ? ? 2013 admitted for chest pain, ruled out for ACS and an exercise stress test was negative for ischemia , ? fixed,  apical thinning.  ?Echo was unremarkable ? ?echocardiogram in 10/2013 showed inferior wall hypokinesis, EF 50-55%, mild mitral regurgitation, mild  LAE.  ? ?nuclear stress test 11/2015  low risk with mild apical thinning and mild apical ischemia, EF 58 with normal wall motion. ? ?CLL, severely low platelets ? ?beta blocker was previously reduced due to bradycardia.  ? ?Aspirin was stopped due to low platelets and frequent nosebleeds.  ? ?Last labs 12/2018 showed Hgb 9.0, plt 68 (as low as 33), K 4.1, Cr 1.36, normal AST/ALT, 06/2018 A1C 7.5, TSH elevated but free T4 normal (managed by primary care), 02/2018 LDL 65.  ?  ? ?PMH:   has a past medical history of Acquired hand deformity (1962), Anemia (10/11/2011), Arthritis, Bradycardia (10/10/2011), CAD (coronary artery disease) (10/10/2011), Carotid artery disease ([place]), Cellulitis of buttock, left (11/17/2018), Chronic edema, CKD (chronic kidney disease) stage 3, GFR 30-59 ml/min (HCC), CLL (chronic lymphocytic leukemia) ([place]), Colon polyps, Cough (04/28/2019), Dysphagia, Generalized headaches, GI bleed (07/08/2017), Glaucoma, HLD (hyperlipidemia), Hyperkalemia (02/28/2018), Hypertension, Hypothyroidism (10/10/2011), Iron deficiency anemia due to chronic blood loss, Squamous cell carcinoma of skin (06/15/2019), Thrombocytopenia ([place]) (10/11/2011), and Type 2 diabetes with nephropathy ([place]). ? ?PSH:    ?Past Surgical History:  ?Procedure Laterality Date  ? BACK SURGERY    ? cervical neck  ? CATARACT EXTRACTION, BILATERAL Bilateral 2017  ? COLONOSCOPY  11/2008  ? 1 polyp, diverticulosis, rec rpt 5 yrs (Dr. Alferd Apa)  ? COLONOSCOPY  06/2014  ? hyperplastic polyp, rpt 5 yrs Oletta Lamas)  ? COLONOSCOPY WITH PROPOFOL N/A 11/17/2017  ? TA, HP, (Vanga, Tally Due, MD)  ? ESOPHAGOGASTRODUODENOSCOPY (EGD) WITH PROPOFOL N/A 11/17/2017  ?  healing erosive gastritis, intestinal metaplasia, neg H pylori (Vanga, Tally Due, MD)  ? ESOPHAGOGASTRODUODENOSCOPY (EGD) WITH PROPOFOL N/A 06/28/2019  ? erosive gastropathy with stigmata of recent bleed, granular tissue in esophagus biopsied- reflux esophagitis, small HH (Vanga, Tally Due, MD)  ?  EYE SURGERY  2012  ? laser surgery for glaucoma  ? PORTACATH PLACEMENT Left 12/09/2018  ? Procedure: INSERTION PORT-A-CATH;  Surgeon: [name], MD;  Location: [facility] ORS;  Service: General;  Laterality: Left;  ? PTCA  1994, 1995  ? US ECHOCARDIOGRAPHY  10/2013  ? inferior wall hypokinesis, mild LVH, EF 50-55%, mild MR and LA dilation  ? ? ?Current Outpatient Medications  ?Medication Sig Dispense Refill  ? acetaminophen (TYLENOL) 650 MG CR tablet Take 1,300 mg by mouth 2 (two) times daily.    ? amLODipine (NORVASC) 10 MG tablet Take 1 tablet (10 mg total) by mouth daily. 90 tablet 3  ? atorvastatin (LIPITOR) 80 MG tablet TAKE 1 TABLET BY MOUTH ON MONDAY, WEDNESDAY AND FRIDAY 39 tablet 3  ? carvedilol (COREG) 6.25 MG tablet TAKE 1 TABLET BY MOUTH TWICE DAILY WITH MEALS 180 tablet 0  ? ezetimibe (ZETIA) 10 MG tablet Take 1 tablet by mouth once daily 90 tablet 0  ? hydrochlorothiazide (MICROZIDE) 12.5 MG capsule Take 1 capsule (12.5 mg total) by mouth daily. 30 capsule 3  ? insulin detemir (LEVEMIR FLEXTOUCH) 100 UNIT/ML FlexPen Inject 14 Units into the skin daily. (Patient taking differently: Inject 10-14 Units into the skin daily.) 15 mL 3  ? levothyroxine (SYNTHROID) 88 MCG tablet Take 1 tablet (88 mcg total) by mouth daily. 90 tablet 3  ? lipase/protease/amylase (CREON) 36000 UNITS CPEP capsule Take 2 capsules with the first bite of each meal and 1 capsule with the first bite of each snack 240 capsule 2  ? meclizine (ANTIVERT) 12.5 MG tablet Take 1 tablet (12.5 mg total) by mouth 2 (two) times daily as needed for dizziness (sedation precautions). 20 tablet 0  ? nitroGLYCERIN (NITROSTAT) 0.4 MG SL tablet Place 1 tablet (0.4 mg total) under the tongue every 5 (five) minutes as needed for chest pain ((MAX of 3 doses)). 25 tablet 0  ? NOVOLOG FLEXPEN 100 UNIT/ML FlexPen INJECT 5 TO 8 UNITS SUBCUTANEOUSLY THREE TIMES DAILY WITH MEALS 15 mL 0  ? oxymetazoline (AFRIN) 0.05 % nasal spray Place 1 spray into left nostril  daily as needed (nose bleeds).    ? traZODone (DESYREL) 100 MG tablet TAKE 1 TABLET BY MOUTH AT BEDTIME AS NEEDED FOR SLEEP 90 tablet 0  ? triamcinolone ointment (KENALOG) 0.5 % Apply 1 application topically 2 (two) times daily. 30 g 1  ? ?No current facility-administered medications for this visit.  ? ?Facility-Administered Medications Ordered in Other Visits  ?Medication Dose Route Frequency Provider Last Rate Last Admin  ? sodium chloride flush (NS) 0.9 % injection 10 mL  10 mL Intravenous Once Lloyd Huger, MD      ? ? ? ?Allergies:   Lisinopril  ? ?Social History:  The patient  reports that he quit smoking about 43 years ago. His smoking use included cigarettes. He has never used smokeless tobacco. He reports that he does not drink alcohol and does not use drugs.  ? ?Family History:   family history includes Coronary artery disease (age of onset: 57) in his son; Diabetes in his sister; Hyperlipidemia in his sister; Other in his brother; Pneumonia in his father; Stomach cancer (age of onset: 67) in his sister.  ? ? ?  Review of Systems: ?Review of Systems  ?Constitutional: Negative.   ?HENT: Negative.    ?Respiratory: Negative.    ?Cardiovascular: Negative.   ?Gastrointestinal: Negative.   ?Musculoskeletal: Negative.   ?Neurological: Negative.   ?Psychiatric/Behavioral: Negative.    ?All other systems reviewed and are negative. ? ? ?PHYSICAL EXAM: ?VS:  BP 110/60 (BP Location: Left Arm, Patient Position: Sitting, Cuff Size: Normal)   Pulse 64   Ht '5\' 11"'$  (1.803 m)   Wt 154 lb (69.9 kg)   SpO2 98%   BMI 21.48 kg/m?  , BMI Body mass index is 21.48 kg/m?. ?GEN: Well nourished, well developed, in no acute distress ?HEENT: normal ?Neck: no JVD, carotid bruits, or masses ?Cardiac: RRR; no murmurs, rubs, or gallops,no edema  ?Respiratory:  clear to auscultation bilaterally, normal work of breathing ?GI: soft, nontender, nondistended, + BS ?MS: no deformity or atrophy ?Skin: warm and dry, no rash ?Neuro:   Strength and sensation are intact ?Psych: euthymic mood, full affect ? ?Recent Labs: ?04/28/2021: ALT 42; Magnesium 1.8 ?05/09/2021: TSH 3.37 ?05/17/2021: Hemoglobin 11.6; Platelets 124 ?06/19/2021: BUN 43; Creatinine, S

## 2021-07-09 NOTE — Patient Instructions (Signed)
Medication Instructions:  No changes  If you need a refill on your cardiac medications before your next appointment, please call your pharmacy.   Lab work: No new labs needed  Testing/Procedures: No new testing needed  Follow-Up: At [facility], you and your health needs are our priority.  As part of our continuing mission to provide you with exceptional heart care, we have created designated Provider Care Teams.  These Care Teams include your primary Cardiologist (physician) and Advanced Practice Providers (APPs -  Physician Assistants and Nurse Practitioners) who all work together to provide you with the care you need, when you need it.  You will need a follow up appointment in 12 months  Providers on your designated Care Team:   Christopher Berge, NP Ryan Dunn, PA-C Cadence Furth, PA-C  COVID-19 Vaccine Information can be found at: [email or web address] For questions related to vaccine distribution or appointments, please email [email or web address] or call [phone].   

## 2021-07-18 ENCOUNTER — Other Ambulatory Visit: Payer: Self-pay | Admitting: Family Medicine

## 2021-07-18 DIAGNOSIS — I10 Essential (primary) hypertension: Secondary | ICD-10-CM

## 2021-07-20 ENCOUNTER — Ambulatory Visit: Payer: Medicare Other | Admitting: Cardiovascular Disease

## 2021-08-10 ENCOUNTER — Ambulatory Visit (INDEPENDENT_AMBULATORY_CARE_PROVIDER_SITE_OTHER): Payer: Medicare Other | Admitting: Family Medicine

## 2021-08-10 ENCOUNTER — Encounter: Payer: Self-pay | Admitting: Family Medicine

## 2021-08-10 VITALS — BP 132/72 | HR 98 | Temp 97.7°F | Ht 71.0 in | Wt 150.2 lb

## 2021-08-10 DIAGNOSIS — Z794 Long term (current) use of insulin: Secondary | ICD-10-CM | POA: Diagnosis not present

## 2021-08-10 DIAGNOSIS — N1832 Chronic kidney disease, stage 3b: Secondary | ICD-10-CM

## 2021-08-10 DIAGNOSIS — K8681 Exocrine pancreatic insufficiency: Secondary | ICD-10-CM

## 2021-08-10 DIAGNOSIS — E1122 Type 2 diabetes mellitus with diabetic chronic kidney disease: Secondary | ICD-10-CM | POA: Diagnosis not present

## 2021-08-10 DIAGNOSIS — I6523 Occlusion and stenosis of bilateral carotid arteries: Secondary | ICD-10-CM

## 2021-08-10 LAB — POCT GLYCOSYLATED HEMOGLOBIN (HGB A1C): Hemoglobin A1C: 7.8 % — AB (ref 4.0–5.6)

## 2021-08-10 NOTE — Progress Notes (Signed)
Patient ID: Scott [name], male    DOB: [birth year], 81 y.o.   MRN: [id number]  This visit was conducted in person.  BP 132/72   Pulse 98   Temp 97.7 F (36.5 C) (Temporal)   Ht 5\' 11"  (1.803 m)   Wt 150 lb 4 oz (68.2 kg)   SpO2 (!) 54%   BMI 20.96 kg/m    CC: DM f/u visit  Subjective:   HPI: Scott [name] is a 81 y.o. male presenting on 08/10/2021 for Diabetes (Here for 3 mo f/u.)   Recent stress test through cardiology for exertional dyspnea reassuringly without ischemia.   DM - h/o brittle diabetes, pending [facility] ([facility]) appt to establish next month. Does regularly check sugars with CGM. Compliant with antihyperglycemic regimen which includes: levemir 14u daily, novolog 5-8 TID with meals. Very worried with low sugars, stays awake checking CGM to ensure no lows. Reviewing 3 months of data, no cbg <90 in that time. Significant portion spent with sugars >200. Denies paresthesias, blurry vision. Last diabetic eye exam 03/2021. Glucometer brand: Freestyle Libre 2. Last foot exam: 12/2020. DSME: 2015, 2020. Lab Results  Component Value Date   HGBA1C 7.8 (A) 08/10/2021   Diabetic Foot Exam - Simple   No data filed    Lab Results  Component Value Date   MICROALBUR 29.0 (H) 02/22/2019         Relevant past medical, surgical, family and social history reviewed and updated as indicated. Interim medical history since our last visit reviewed. Allergies and medications reviewed and updated. Outpatient Medications Prior to Visit  Medication Sig Dispense Refill   acetaminophen (TYLENOL) 650 MG CR tablet Take 1,300 mg by mouth 2 (two) times daily.     amLODipine (NORVASC) 10 MG tablet Take 1 tablet (10 mg total) by mouth daily. 90 tablet 3   atorvastatin (LIPITOR) 80 MG tablet TAKE 1 TABLET BY MOUTH ON MONDAY, WEDNESDAY AND FRIDAY 39 tablet 3   carvedilol (COREG) 6.25 MG tablet TAKE 1 TABLET BY MOUTH TWICE DAILY WITH MEALS 180 tablet 3   ezetimibe (ZETIA) 10 MG tablet Take 1  tablet (10 mg total) by mouth daily. 90 tablet 3   hydrochlorothiazide (MICROZIDE) 12.5 MG capsule Take 1 capsule (12.5 mg total) by mouth daily. 30 capsule 3   insulin detemir (LEVEMIR FLEXTOUCH) 100 UNIT/ML FlexPen Inject 14 Units into the skin daily. (Patient taking differently: Inject 10-14 Units into the skin daily.) 15 mL 3   levothyroxine (SYNTHROID) 88 MCG tablet Take 1 tablet (88 mcg total) by mouth daily. 90 tablet 3   lipase/protease/amylase (CREON) 36000 UNITS CPEP capsule Take 2 capsules with the first bite of each meal and 1 capsule with the first bite of each snack 240 capsule 2   meclizine (ANTIVERT) 12.5 MG tablet Take 1 tablet (12.5 mg total) by mouth 2 (two) times daily as needed for dizziness (sedation precautions). 20 tablet 0   nitroGLYCERIN (NITROSTAT) 0.4 MG SL tablet Place 1 tablet (0.4 mg total) under the tongue every 5 (five) minutes as needed for chest pain ((MAX of 3 doses)). 25 tablet 0   NOVOLOG FLEXPEN 100 UNIT/ML FlexPen INJECT 5 TO 8 UNITS SUBCUTANEOUSLY THREE TIMES DAILY WITH MEALS 15 mL 0   oxymetazoline (AFRIN) 0.05 % nasal spray Place 1 spray into left nostril daily as needed (nose bleeds).     traZODone (DESYREL) 100 MG tablet TAKE 1 TABLET BY MOUTH AT BEDTIME AS NEEDED FOR SLEEP 90 tablet 0  triamcinolone ointment (KENALOG) 0.5 % Apply 1 application topically 2 (two) times daily. 30 g 1   Facility-Administered Medications Prior to Visit  Medication Dose Route Frequency Provider Last Rate Last Admin   sodium chloride flush (NS) 0.9 % injection 10 mL  10 mL Intravenous Once Orlie Dakin, Tollie Pizza, MD         Per HPI unless specifically indicated in ROS section below Review of Systems  Objective:  BP 132/72   Pulse 98   Temp 97.7 F (36.5 C) (Temporal)   Ht 5\' 11"  (1.803 m)   Wt 150 lb 4 oz (68.2 kg)   SpO2 (!) 54%   BMI 20.96 kg/m   Wt Readings from Last 3 Encounters:  08/10/21 150 lb 4 oz (68.2 kg)  07/09/21 154 lb (69.9 kg)  06/19/21 151 lb 2 oz  (68.5 kg)      Physical Exam Vitals and nursing note reviewed.  Constitutional:      Appearance: Normal appearance. He is not ill-appearing.  HENT:     Mouth/Throat:     Mouth: Mucous membranes are moist.     Pharynx: Oropharynx is clear. No oropharyngeal exudate or posterior oropharyngeal erythema.  Eyes:     Extraocular Movements: Extraocular movements intact.     Conjunctiva/sclera: Conjunctivae normal.     Pupils: Pupils are equal, round, and reactive to light.     Comments: S/p bilat cataract surgeries  Cardiovascular:     Rate and Rhythm: Normal rate. Rhythm irregular. Extrasystoles are present.    Pulses: Normal pulses.     Heart sounds: Normal heart sounds. No murmur heard. Pulmonary:     Effort: Pulmonary effort is normal. No respiratory distress.     Breath sounds: Normal breath sounds. No wheezing, rhonchi or rales.  Musculoskeletal:     Right lower leg: No edema.     Left lower leg: No edema.     Comments: See HPI for foot exam if done  Skin:    General: Skin is warm and dry.     Findings: No rash.  Neurological:     Mental Status: He is alert.  Psychiatric:        Mood and Affect: Mood normal.        Behavior: Behavior normal.       Results for orders placed or performed in visit on 08/10/21  POCT glycosylated hemoglobin (Hb A1C)  Result Value Ref Range   Hemoglobin A1C 7.8 (A) 4.0 - 5.6 %   HbA1c POC (<> result, manual entry)     HbA1c, POC (prediabetic range)     HbA1c, POC (controlled diabetic range)      Assessment & Plan:   Problem List Items Addressed This Visit     Type 2 diabetes mellitus with diabetic chronic kidney disease (HCC) - Primary    Chronic brittle diabetic on levemir 14u daily with novolog 5-8u TID AC. Fortunately no low readings in the past 3 months on this regimen, reviewed cbg readings with patient.  A1c shows improvement but still above goal.  Will appreciate endo assistance with this brittle diabetic with pancreatic  insufficiency.       Relevant Orders   POCT glycosylated hemoglobin (Hb A1C) (Completed)   Exocrine pancreatic insufficiency    Sees GI, now on lifelong Creon.         No orders of the defined types were placed in this encounter.  Orders Placed This Encounter  Procedures   POCT glycosylated hemoglobin (Hb A1C)  Patient Instructions  Sugars are doing better, fortunately no low sugars <70 in the past 3 months!  Keep endocrinology appointment next month.  Good to see you today. Return as needed or in 3-4 months for follow up visit.   Follow up plan: Return in about 4 months (around 12/10/2021) for follow up visit.  Eustaquio Boyden, MD

## 2021-08-10 NOTE — Patient Instructions (Addendum)
Sugars are doing better, fortunately no low sugars <70 in the past 3 months!  Keep endocrinology appointment next month.  Good to see you today. Return as needed or in 3-4 months for follow up visit.

## 2021-08-11 NOTE — Assessment & Plan Note (Addendum)
Chronic brittle diabetic on levemir 14u daily with novolog 5-8u TID AC. Fortunately no low readings in the past 3 months on this regimen, reviewed cbg readings with patient.  A1c shows improvement but still above goal.  Will appreciate endo assistance with this brittle diabetic with pancreatic insufficiency.

## 2021-08-11 NOTE — Assessment & Plan Note (Signed)
Sees GI, now on lifelong Creon.

## 2021-08-17 ENCOUNTER — Other Ambulatory Visit: Payer: Self-pay

## 2021-08-17 ENCOUNTER — Other Ambulatory Visit: Payer: Self-pay | Admitting: Family Medicine

## 2021-08-17 ENCOUNTER — Inpatient Hospital Stay: Payer: Medicare Other | Attending: Oncology

## 2021-08-17 DIAGNOSIS — D696 Thrombocytopenia, unspecified: Secondary | ICD-10-CM | POA: Insufficient documentation

## 2021-08-17 DIAGNOSIS — E1122 Type 2 diabetes mellitus with diabetic chronic kidney disease: Secondary | ICD-10-CM | POA: Diagnosis not present

## 2021-08-17 DIAGNOSIS — I129 Hypertensive chronic kidney disease with stage 1 through stage 4 chronic kidney disease, or unspecified chronic kidney disease: Secondary | ICD-10-CM | POA: Insufficient documentation

## 2021-08-17 DIAGNOSIS — C9110 Chronic lymphocytic leukemia of B-cell type not having achieved remission: Secondary | ICD-10-CM

## 2021-08-17 DIAGNOSIS — D649 Anemia, unspecified: Secondary | ICD-10-CM | POA: Insufficient documentation

## 2021-08-17 DIAGNOSIS — Z87891 Personal history of nicotine dependence: Secondary | ICD-10-CM | POA: Diagnosis not present

## 2021-08-17 LAB — CBC WITH DIFFERENTIAL/PLATELET
Abs Immature Granulocytes: 0.01 10*3/uL (ref 0.00–0.07)
Basophils Absolute: 0 10*3/uL (ref 0.0–0.1)
Basophils Relative: 1 %
Eosinophils Absolute: 0.1 10*3/uL (ref 0.0–0.5)
Eosinophils Relative: 2 %
HCT: 35.5 % — ABNORMAL LOW (ref 39.0–52.0)
Hemoglobin: 11.3 g/dL — ABNORMAL LOW (ref 13.0–17.0)
Immature Granulocytes: 0 %
Lymphocytes Relative: 19 %
Lymphs Abs: 0.8 10*3/uL (ref 0.7–4.0)
MCH: 29.8 pg (ref 26.0–34.0)
MCHC: 31.8 g/dL (ref 30.0–36.0)
MCV: 93.7 fL (ref 80.0–100.0)
Monocytes Absolute: 0.4 10*3/uL (ref 0.1–1.0)
Monocytes Relative: 9 %
Neutro Abs: 3 10*3/uL (ref 1.7–7.7)
Neutrophils Relative %: 69 %
Platelets: 117 10*3/uL — ABNORMAL LOW (ref 150–400)
RBC: 3.79 MIL/uL — ABNORMAL LOW (ref 4.22–5.81)
RDW: 12.5 % (ref 11.5–15.5)
WBC: 4.3 10*3/uL (ref 4.0–10.5)
nRBC: 0 % (ref 0.0–0.2)

## 2021-08-21 ENCOUNTER — Telehealth: Payer: Self-pay

## 2021-08-21 NOTE — Telephone Encounter (Signed)
Prior auth not needed for Meclizine Hcl 12.5 mg.  I went on Cover my meds to start PA, it says it was reversed by [place].  I called and spoke to pharmacist.  Patient used a Good Rx card and was able to get prescription for under $7.00, paid out of pocket.  No prior auth needed.

## 2021-08-31 ENCOUNTER — Telehealth: Payer: Self-pay | Admitting: Gastroenterology

## 2021-08-31 NOTE — Telephone Encounter (Signed)
Pt is requesting refill on his creon fax number to the pharmacy [phone]

## 2021-08-31 NOTE — Telephone Encounter (Signed)
Called patient to find out the name of the pharmacy  Last office visit 04/03/2021 [facility]  Last refill 12/12/20 2 refills  Has appointment 10/02/2021

## 2021-09-03 ENCOUNTER — Encounter: Payer: Self-pay | Admitting: Gastroenterology

## 2021-09-06 MED ORDER — PANCRELIPASE (LIP-PROT-AMYL) 36000-114000 UNITS PO CPEP: ORAL_CAPSULE | ORAL | 11 refills | Status: DC

## 2021-09-06 NOTE — Telephone Encounter (Signed)
Sent medication to patient assistance

## 2021-09-13 DIAGNOSIS — H401132 Primary open-angle glaucoma, bilateral, moderate stage: Secondary | ICD-10-CM | POA: Diagnosis not present

## 2021-09-13 DIAGNOSIS — H43822 Vitreomacular adhesion, left eye: Secondary | ICD-10-CM | POA: Diagnosis not present

## 2021-09-18 ENCOUNTER — Telehealth: Payer: Self-pay

## 2021-09-18 NOTE — Chronic Care Management (AMB) (Signed)
    Chronic Care Management Pharmacy Assistant   Name: Scott [name]  MRN: [id number] DOB: 08-02-1940   Reason for Encounter: Reminder Call   Conditions to be addressed/monitored: HTN, HLD, and DMII   Medications: Outpatient Encounter Medications as of 09/18/2021  Medication Sig   acetaminophen (TYLENOL) 650 MG CR tablet Take 1,300 mg by mouth 2 (two) times daily.   amLODipine (NORVASC) 10 MG tablet Take 1 tablet (10 mg total) by mouth daily.   atorvastatin (LIPITOR) 80 MG tablet TAKE 1 TABLET BY MOUTH ON MONDAY, WEDNESDAY AND FRIDAY   carvedilol (COREG) 6.25 MG tablet TAKE 1 TABLET BY MOUTH TWICE DAILY WITH MEALS   ezetimibe (ZETIA) 10 MG tablet Take 1 tablet (10 mg total) by mouth daily.   hydrochlorothiazide (MICROZIDE) 12.5 MG capsule Take 1 capsule (12.5 mg total) by mouth daily.   insulin detemir (LEVEMIR FLEXTOUCH) 100 UNIT/ML FlexPen Inject 14 Units into the skin daily. (Patient taking differently: Inject 10-14 Units into the skin daily.)   levothyroxine (SYNTHROID) 88 MCG tablet Take 1 tablet (88 mcg total) by mouth daily.   lipase/protease/amylase (CREON) 36000 UNITS CPEP capsule Take 2 capsules with the first bite of each meal and 1 capsule with the first bite of each snack   meclizine (ANTIVERT) 12.5 MG tablet TAKE 1 TABLET BY MOUTH TWICE DAILY AS NEEDED FOR  DIZZINESS  (SEDATION  PRECAUTIONS)   nitroGLYCERIN (NITROSTAT) 0.4 MG SL tablet Place 1 tablet (0.4 mg total) under the tongue every 5 (five) minutes as needed for chest pain ((MAX of 3 doses)).   NOVOLOG FLEXPEN 100 UNIT/ML FlexPen INJECT 5 TO 8 UNITS SUBCUTANEOUSLY THREE TIMES DAILY WITH MEALS   oxymetazoline (AFRIN) 0.05 % nasal spray Place 1 spray into left nostril daily as needed (nose bleeds).   traZODone (DESYREL) 100 MG tablet TAKE 1 TABLET BY MOUTH AT BEDTIME AS NEEDED FOR SLEEP   triamcinolone ointment (KENALOG) 0.5 % Apply 1 application topically 2 (two) times daily.   Facility-Administered Encounter  Medications as of 09/18/2021  Medication   sodium chloride flush (NS) 0.9 % injection 10 mL   Scott [name] was contacted to remind of upcoming telephone visit with Scott [name]  on 09/21/21 at 8:45am. Patient was reminded to have any blood glucose and blood pressure readings available for review at appointment.   Patient confirmed appointment.   Are you having any problems with your medications? No Patient is getting the creon from the manufacturer  Do you have any concerns you like to discuss with the pharmacist? No     09/18/21- am BP  144/80   CCM referral has been placed prior to visit?  Yes    Star Rating Drugs: Medication:  Last Fill: Day Supply Atorvastatin '80mg'$  08/10/21  90 Levemir  05/30/21 108 Novolog  08/20/21 [place], [facility] notified  Avel Sensor, [place]  [phone]

## 2021-09-21 ENCOUNTER — Ambulatory Visit (INDEPENDENT_AMBULATORY_CARE_PROVIDER_SITE_OTHER): Payer: Medicare Other | Admitting: Pharmacist

## 2021-09-21 DIAGNOSIS — Z794 Long term (current) use of insulin: Secondary | ICD-10-CM

## 2021-09-21 DIAGNOSIS — I2510 Atherosclerotic heart disease of native coronary artery without angina pectoris: Secondary | ICD-10-CM

## 2021-09-21 DIAGNOSIS — I10 Essential (primary) hypertension: Secondary | ICD-10-CM

## 2021-09-21 DIAGNOSIS — E1169 Type 2 diabetes mellitus with other specified complication: Secondary | ICD-10-CM

## 2021-09-21 DIAGNOSIS — N1832 Chronic kidney disease, stage 3b: Secondary | ICD-10-CM

## 2021-09-21 NOTE — Progress Notes (Signed)
Chronic Care Management Pharmacy Note  09/28/2021 Name:  Scott [name] MRN:  [id number] DOB:  1940-04-19  Summary: CCM F/U visit -Pt endorses compliance with medications as prescribed -A1c 7.8% (08/2021) improved from 8.6%; pt denies recent episodes of hypoglycemia (now wearing Freestyle Libre 2 with low alarms which has helped)  Recommendations/Changes made from today's visit: -No med changes; f/u with endocrine as scheduled  Plan: -Pharmacist follow up televisit scheduled for 6 months -PCP F/U 12/10/21   Subjective: Scott [name] is an 81 y.o. year old male who is a primary patient of [name], MD.  The CCM team was consulted for assistance with disease management and care coordination needs.    Engaged with patient by telephone for follow up visit in response to provider referral for pharmacy case management and/or care coordination services.   Consent to Services:  The patient was given information about Chronic Care Management services, agreed to services, and gave verbal consent prior to initiation of services.  Please see initial visit note for detailed documentation.   Patient Care Team: [name], MD as PCP - General (Family Medicine) Dorothy Spark, MD as PCP - Cardiology (Cardiology) Lloyd Huger, MD as Consulting Physician (Oncology) Clent Jacks, MD as Consulting Physician (Ophthalmology) Dorothy Spark, MD as Consulting Physician (Cardiology) Madelon Lips, MD as Consulting Physician (Nephrology) [name], [facility] as Pharmacist (Pharmacist)  Recent office visits: 08/10/21 Dr Danise Mina OV: f/u; A1c 7.8%, brittle DM. Keep endo appt.  06/06/21 phone note - c/o swelling on 10 mg amlodipine. Reduce amlodipien to 5 mg, start HCTZ 12.5 mg daily.  05/30/21 NP Matt Charmian Muff OV: upper respiratory tract infection-negative covid -treat with doxycycline 100 mg twice daily for 7 days.   05/25/21 pt message - BP high off lisinopril.  Increase amlodipine to 10 mg.  05/09/21-PCP-Javier Gutierrez,MD-AWV- Labs ordered (Your potassium returned normal off lisinopril - stay off this medication.) New A1c 8.6- Referral to Endocrinology-check pharmacy for shingrix series  Recent consult visits: 07/09/21 Dr Rockey Situ (Cardiology): f/u CAD; recent stress test wnl; no med changes 05/25/21-[facility] [place]- elevated blood pressure- He recently stopped taking lisinopril(hyperkalemia)Labs,EKG,follow up PCP  05/17/21-[facility]-Timothy Finnegan,MD-Follow up [place] will require premedications for any further infusions with Rituxan  04/03/21 Dr Precious Reel (GI): f/u pancreatic insufficiency. Continue creon. Weight loss may be 2/2 poorly controlled DM.  03/06/21 Dr Katy Fitch (eye)- eye exam  Hospital visits: None in previous 6 months   Objective:  Lab Results  Component Value Date   CREATININE 1.70 (H) 06/19/2021   BUN 43 (H) 06/19/2021   GFR 37.46 (L) 06/19/2021   GFRNONAA 39 (L) 04/28/2021   GFRAA 42 (L) 10/28/2019   NA 140 06/19/2021   K 4.8 06/19/2021   CALCIUM 9.7 06/19/2021   CO2 30 06/19/2021   GLUCOSE 227 (H) 06/19/2021    Lab Results  Component Value Date/Time   HGBA1C 7.8 (A) 08/10/2021 10:33 AM   HGBA1C 8.6 (H) 05/09/2021 10:22 AM   HGBA1C 8.0 (A) 12/18/2020 10:22 AM   HGBA1C 8.0 (H) 04/12/2020 09:01 AM   FRUCTOSAMINE 348 (H) 02/22/2019 10:07 AM   FRUCTOSAMINE 392 (H) 06/24/2018 10:15 AM   GFR 37.46 (L) 06/19/2021 12:59 PM   GFR 40.02 (L) 05/14/2021 10:10 AM   MICROALBUR 29.0 (H) 02/22/2019 10:07 AM   MICROALBUR 5.6 (H) 03/27/2018 09:45 AM    Last diabetic Eye exam:  Lab Results  Component Value Date/Time   HMDIABEYEEXA No Retinopathy 03/06/2021 12:00 AM  Last diabetic Foot exam: No results found for: "HMDIABFOOTEX"   Lab Results  Component Value Date   CHOL 131 05/09/2021   HDL 44.60 05/09/2021   LDLCALC 99 04/12/2020   LDLDIRECT 69.0 05/09/2021   TRIG 255.0 (H) 05/09/2021    CHOLHDL 3 05/09/2021       Latest Ref Rng & Units 05/09/2021   10:22 AM 04/28/2021    6:22 AM 02/15/2021    9:52 AM  Hepatic Function  Total Protein 6.5 - 8.1 g/dL  6.9  6.7   Albumin 3.5 - 5.2 g/dL 4.4  4.1  4.2   AST 15 - 41 U/L  34  39   ALT 0 - 44 U/L  42  48   Alk Phosphatase 38 - 126 U/L  89  82   Total Bilirubin 0.3 - 1.2 mg/dL  0.7  0.6     Lab Results  Component Value Date/Time   TSH 3.37 05/09/2021 10:22 AM   TSH 1.80 04/12/2020 09:01 AM   FREET4 1.17 05/11/2019 11:32 AM   FREET4 0.87 02/22/2019 10:07 AM       Latest Ref Rng & Units 08/17/2021   10:37 AM 05/17/2021   10:05 AM 04/28/2021    6:22 AM  CBC  WBC 4.0 - 10.5 K/uL 4.3  5.8  7.1   Hemoglobin 13.0 - 17.0 g/dL 11.3  11.6  11.3   Hematocrit 39.0 - 52.0 % 35.5  36.5  35.8   Platelets 150 - 400 K/uL 117  124  120     Lab Results  Component Value Date/Time   VD25OH 48.08 05/09/2021 10:22 AM   VD25OH 41.62 04/12/2020 09:01 AM    Clinical ASCVD: Yes  The ASCVD Risk score (Arnett DK, et al., 2019) failed to calculate for the following reasons:   The 2019 ASCVD risk score is only valid for ages 53 to 72       05/09/2021    9:32 AM 04/11/2020   12:00 PM 02/22/2019    2:14 PM  Depression screen PHQ 2/9  Decreased Interest 0 0 0  Down, Depressed, Hopeless 0 0 0  PHQ - 2 Score 0 0 0  Altered sleeping  0 0  Tired, decreased energy  0 0  Change in appetite  0 0  Feeling bad or failure about yourself   0 0  Trouble concentrating  0 0  Moving slowly or fidgety/restless  0 0  Suicidal thoughts  0 0  PHQ-9 Score  0 0  Difficult doing work/chores  Not difficult at all Not difficult at all     Social History   Tobacco Use  Smoking Status Former   Types: Cigarettes   Quit date: 03/04/1978   Years since quitting: 43.6  Smokeless Tobacco Never   BP Readings from Last 3 Encounters:  09/27/21 (!) 144/86  08/10/21 132/72  07/09/21 110/60   Pulse Readings from Last 3 Encounters:  09/27/21 61  08/10/21 98   07/09/21 64   Wt Readings from Last 3 Encounters:  09/27/21 151 lb (68.5 kg)  08/10/21 150 lb 4 oz (68.2 kg)  07/09/21 154 lb (69.9 kg)   BMI Readings from Last 3 Encounters:  09/27/21 21.06 kg/m  08/10/21 20.96 kg/m  07/09/21 21.48 kg/m    Assessment/Interventions: Review of patient past medical history, allergies, medications, health status, including review of consultants reports, laboratory and other test data, was performed as part of comprehensive evaluation and provision of chronic care management services.  SDOH:  (Social Determinants of Health) assessments and interventions performed: Yes   SDOH Screenings   Alcohol Screen: Low Risk  (04/11/2020)   Alcohol Screen    Last Alcohol Screening Score (AUDIT): 0  Depression (PHQ2-9): Low Risk  (05/09/2021)   Depression (PHQ2-9)    PHQ-2 Score: 0  Financial Resource Strain: Low Risk  (06/29/2021)   Overall Financial Resource Strain (CARDIA)    Difficulty of Paying Living Expenses: Not very hard  Food Insecurity: No Food Insecurity (06/29/2021)   Hunger Vital Sign    Worried About Running Out of Food in the Last Year: Never true    Ran Out of Food in the Last Year: Never true  Housing: Low Risk  (04/11/2020)   Housing    Last Housing Risk Score: 0  Physical Activity: Inactive (04/11/2020)   Exercise Vital Sign    Days of Exercise per Week: 0 days    Minutes of Exercise per Session: 0 min  Social Connections: Not on file  Stress: No Stress Concern Present (04/11/2020)   [place]    Feeling of Stress : Not at all  Tobacco Use: Medium Risk (08/10/2021)   Patient History    Smoking Tobacco Use: Former    Smokeless Tobacco Use: Never    Passive Exposure: Not on file  Transportation Needs: No Transportation Needs (04/11/2020)   PRAPARE - Hydrologist (Medical): No    Lack of Transportation (Non-Medical): No    CCM Care  Plan  Allergies  Allergen Reactions   Lisinopril Other (See Comments)    Hyperkalemia even at 18m dose    Medications Reviewed Today     Reviewed by HElyse Jarvis RMA (Registered Medical Assistant) on 09/27/21 at [phone] Med List Status: <None>   Medication Order Taking? Sig Documenting Provider Last Dose Status Informant  acetaminophen (TYLENOL) 650 MG CR tablet [id number] Take 1,300 mg by mouth 2 (two) times daily. [provider] Taking Active Self           Med Note ([facility]  Thu Sep 27, 2021  9:46 AM) Prn last dose this am   amLODipine (NORVASC) 10 MG tablet [id number] Take 1 tablet (10 mg total) by mouth daily. GRia Bush MD Taking Active   atorvastatin (LIPITOR) 80 MG tablet [id number] TAKE 1 TABLET BY MOUTH ON MONDAY, WBurnsideFRoby Lofts MD Taking Active   carvedilol (COREG) 6.25 MG tablet [id number] TAKE 1 TABLET BY MOUTH TWICE DAILY WITH MEALS GRia Bush MD Taking Active   ezetimibe (ZETIA) 10 MG tablet [id number] Take 1 tablet (10 mg total) by mouth daily. GMinna Merritts MD Taking Active   hydrochlorothiazide (MICROZIDE) 12.5 MG capsule [id number] Take 1 capsule by mouth once daily GRia Bush MD Taking Active   insulin detemir (LEVEMIR FLEXTOUCH) 100 UNIT/ML FlexPen [id number] Inject 14 Units into the skin daily.  Patient taking differently: Inject 10-14 Units into the skin daily.   GRia Bush MD Taking Active   levothyroxine (SYNTHROID) 88 MCG tablet [id number] Take 1 tablet (88 mcg total) by mouth daily. GRia Bush MD Taking Active   lipase/protease/amylase (CREON) 36000 UNITS CPEP capsule [id number] Take 2 capsules with the first bite of each meal and 1 capsule with the first bite of each snack Vanga, RTally Due MD Taking Active   meclizine (ANTIVERT) 12.5 MG tablet [id number] TAKE 1 TABLET BY  MOUTH TWICE DAILY AS NEEDED FOR  DIZZINESS  (SEDATION  PRECAUTIONS)  [name], MD Taking Active            Med Note Mallie Mussel, [facility]   Thu Sep 27, 2021  9:47 AM) Prn last dose a week ago  nitroGLYCERIN (NITROSTAT) 0.4 MG SL tablet [id number] No Place 1 tablet (0.4 mg total) under the tongue every 5 (five) minutes as needed for chest pain ((MAX of 3 doses)).  Patient not taking: Reported on 09/27/2021   [name], MD Not Taking Active Self           Med Note Elyse Jarvis   Thu Sep 27, 2021  9:47 AM) Prn last dose years ago  NOVOLOG FLEXPEN 100 UNIT/ML FlexPen [id number] Yes INJECT 5 TO 8 UNITS SUBCUTANEOUSLY THREE TIMES DAILY WITH MEALS [name], MD Taking Active   oxymetazoline (AFRIN) 0.05 % nasal spray [id number] Yes Place 1 spray into left nostril daily as needed (nose bleeds). [provider] Taking Active Self  traZODone (DESYREL) 100 MG tablet [id number] Yes TAKE 1 TABLET BY MOUTH AT BEDTIME AS NEEDED FOR SLEEP [name], MD Taking Active   triamcinolone ointment (KENALOG) 0.5 % [id number] No Apply 1 application topically 2 (two) times daily.  Patient not taking: Reported on 09/27/2021   Owens Loffler, MD Not Taking Active             Patient Active Problem List   Diagnosis Date Noted   Type 2 diabetes mellitus with diabetic polyneuropathy, with long-term current use of insulin ([place]) 09/27/2021   Type 2 diabetes mellitus with hyperglycemia, with long-term current use of insulin ([place]) 09/27/2021   Exertional dyspnea 06/19/2021   Encounter for general adult medical examination with abnormal findings 05/09/2021   Exocrine pancreatic insufficiency 12/18/2020   Hearing loss 08/18/2020   Pancytopenia ([place]) 04/19/2020   Chronic midline low back pain without sciatica 10/13/2019   Urinary dribbling 09/10/2019   Gastritis with intestinal metaplasia of stomach    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 05/11/2019   Acute cough 04/28/2019   Left foot pain 11/17/2018   Cervical  spondylosis 03/27/2018   Carotid stenosis, asymptomatic, bilateral 03/26/2018   Sternal fracture 09/13/2017   Tinea corporis 08/23/2017   Pedal edema 08/23/2017   Glaucoma    Colon polyps    Arthritis    CLL (chronic lymphocytic leukemia) ([place]) 01/18/2016   Chronic insomnia 01/03/2016   Diabetic neuropathy ([place]) 06/09/2015   BPV (benign positional vertigo) 05/23/2014   Advanced care planning/counseling discussion 01/25/2014   Hyperlipidemia associated with type 2 diabetes mellitus ([place])    CKD (chronic kidney disease) stage 3, GFR 30-59 ml/min (HCC)    Type 2 diabetes mellitus with diabetic chronic kidney disease ([place])    Anemia in CKD (chronic kidney disease) 10/11/2011   Idiopathic thrombocytopenic purpura (ITP) ([place]) 10/11/2011   CAD (coronary artery disease) 10/10/2011   Hypertension 10/10/2011   Hypothyroidism 10/10/2011   Acquired hand deformity 03/04/1960    Immunization History  Administered Date(s) Administered   Fluad Quad(high Dose 65+) 11/04/2018, 12/18/2020   Influenza Split 12/03/2011   Influenza, High Dose Seasonal PF 12/10/2019   Influenza,inj,Quad PF,6+ Mos 11/06/2012, 01/25/2014, 02/08/2015, 01/03/2016, 02/17/2017, 01/22/2018   PFIZER(Purple Top)SARS-COV-2 Vaccination 05/02/2019, 05/23/2019, 12/10/2019   Pfizer Covid-19 Vaccine Bivalent Booster 4yr & up 12/18/2020   Pneumococcal Conjugate-13 07/21/2013   Pneumococcal Polysaccharide-23 03/11/2011   Tdap 08/23/2017   Zoster, Live 03/17/2013    Conditions  to be addressed/monitored:  Hypertension, Hyperlipidemia, Diabetes, and Coronary Artery Disease  Care Plan : [place]  Updates made by [name], RPH since 09/28/2021 12:00 AM     Problem: Hypertension, Hyperlipidemia, Diabetes, and Coronary Artery Disease   Priority: High     Long-Range Goal: Disease mgmt   Start Date: 06/29/2021  Expected End Date: 06/30/2022  This Visit's Progress: On track  Recent Progress: On track   Priority: High  Note:   Current Barriers:  None identified  Pharmacist Clinical Goal(s):  Patient will contact provider office for questions/concerns as evidenced notation of same in electronic health record through collaboration with PharmD and provider.   Interventions: 1:1 collaboration with [name], MD regarding development and update of comprehensive plan of care as evidenced by provider attestation and co-signature Inter-disciplinary care team collaboration (see longitudinal plan of care) Comprehensive medication review performed; medication list updated in electronic medical record  Hypertension (BP goal <140/90) -Controlled - home BP at goal -Current home BP readings: 144/84 -Current treatment: Carvedilol 6.25 mg BID - Appropriate, Effective, Safe, Accessible HCTZ 12.5 mg daily - Appropriate, Effective, Safe, Accessible Amlodipine 10 mg daily - Appropriate, Effective, Safe, Accessible -Medications previously tried: lisinopril (hyperK) -Educated on BP goals and benefits of medications for prevention of heart attack, stroke and kidney damage; -Counseled to monitor BP at home daily -Recommended to continue current medication  Hyperlipidemia / CAD: (LDL goal < 70) -Controlled - LDL 69 (05/2021) at goal -Hx CAD. Intolerant to aspirin (nosebleeds requiring ED visits) -Current treatment: Atorvastatin 80 mg MWF - Appropriate, Effective, Safe, Accessible Ezetimibe 10 mg daily - Appropriate, Effective, Safe, Accessible -Medications previously tried: n/a  -Educated on Cholesterol goals; Benefits of statin for ASCVD risk reduction; -Recommended to continue current medication  Diabetes (A1c goal <8%; TIR > 70%) -Controlled - A1c 7.8% (08/2021); he has not had as many low sugars lately -Upcoming Endo appt with Dr [name] ([facility]) for mgmt of brittle DM -Current home glucose readings: 161 today (fasting); using [employer] with phone, not connected to [place] at  this time -Current medications: Levemir 14 units daily AM - Appropriate, Effective, Safe, Accessible Novolog 5-8 units TID w/ meals - Appropriate, Effective, Safe, Accessible Freestyle Libre 2 - Appropriate, Effective, Safe, Accessible -Medications previously tried: n/a -Current meal patterns: eats egg, toast, bacon - sugar jumped 200 pts -Educated on A1c and blood sugar goals; Complications of diabetes including kidney damage, retinal damage, and cardiovascular disease; Continuous glucose monitoring; -Recommended to continue current medication; keep endo appt 7/27  Hypothyroidism (Goal: maintain TSH in goal range) -Controlled - TSH within goal range -Current treatment  Levothyroxine 88 mcg daily - Appropriate, Effective, Safe, Accessible -Medications previously tried: n/a  -Recommended to continue current medication  Pancreatic insufficiency (Goal: manage symptoms) -Controlled - per pt report; he is getting Creon through PAP -Current treatment  Creon 36000 unit - 2 cap w/ meals, 1 cap w/ snacks (PAP) - Appropriate, Effective, Safe, Accessible -Medications previously tried: n/a  -Recommended to continue current medication  Health Maintenance -Vaccine gaps: none -Hx CLL. Following with oncology. S/p Rituxan 2020. -Current therapy:  Trazodone 100 mg HS prn sleep Afrin nasal spray PRN Tylenol 650 mg PRN Hydroxyurea 500 mg daily - not taking. Removed from list. Meclizine 12.5 mg PRN Multivitamin (Centrum Silver Mens 50+) -Patient is satisfied with current therapy and denies issues -Recommended to continue current medication  Patient Goals/Self-Care Activities Patient will:  - take medications as prescribed as evidenced by  patient report and record review focus on medication adherence by pill box check glucose w/ Freestyle Libre, document, and provide at future appointments check blood pressure daily, document, and provide at future appointments        Medication  Assistance: None required.  Patient affirms current coverage meets needs.  Compliance/Adherence/Medication fill history: Care Gaps: Urine microalbumin  Star-Rating Drugs: Atorvastatin - PDC unavailable; LF 05/27/21 x 84 ds  Medication Access: Within the past 30 days, how often has patient missed a dose of medication? 0 Is a pillbox or other method used to improve adherence? Yes  Factors that may affect medication adherence? no barriers identified Are meds synced by current pharmacy? No  Are meds delivered by current pharmacy? No  Does patient experience delays in picking up medications due to transportation concerns? No   [facility] Services Reviewed: Is patient disadvantaged to use [facility]?: No  Current Rx insurance plan: [place] Name and location of Current pharmacy:  [facility] [place], [place] - [address] [place] [address] Phone: [phone] Fax: [place], [address] [place] [address] Phone: [phone] Fax: [phone]  [facility] services reviewed with patient today?: No  Patient requests to transfer care to [facility]?: No  Reason patient declined to change pharmacies: Not mentioned at this visit   Care Plan and Follow Up Patient Decision:  Patient agrees to Care Plan and Follow-up.  Plan: Telephone follow up appointment with care management team member scheduled for:  6 months  Charlene Brooke, PharmD, BCACP Clinical Pharmacist [facility] [phone]

## 2021-09-26 ENCOUNTER — Other Ambulatory Visit: Payer: Self-pay | Admitting: Family Medicine

## 2021-09-27 ENCOUNTER — Ambulatory Visit (INDEPENDENT_AMBULATORY_CARE_PROVIDER_SITE_OTHER): Payer: Medicare Other | Admitting: Internal Medicine

## 2021-09-27 VITALS — BP 144/86 | HR 61 | Temp 97.5°F | Ht 71.0 in | Wt 151.0 lb

## 2021-09-27 DIAGNOSIS — E1142 Type 2 diabetes mellitus with diabetic polyneuropathy: Secondary | ICD-10-CM

## 2021-09-27 DIAGNOSIS — N1832 Chronic kidney disease, stage 3b: Secondary | ICD-10-CM | POA: Diagnosis not present

## 2021-09-27 DIAGNOSIS — E1122 Type 2 diabetes mellitus with diabetic chronic kidney disease: Secondary | ICD-10-CM

## 2021-09-27 DIAGNOSIS — Z794 Long term (current) use of insulin: Secondary | ICD-10-CM | POA: Insufficient documentation

## 2021-09-27 DIAGNOSIS — E1165 Type 2 diabetes mellitus with hyperglycemia: Secondary | ICD-10-CM

## 2021-09-27 MED ORDER — NOVOLOG FLEXPEN 100 UNIT/ML ~~LOC~~ SOPN: PEN_INJECTOR | SUBCUTANEOUS | 6 refills | Status: DC

## 2021-09-27 MED ORDER — INSULIN PEN NEEDLE 32G X 4 MM MISC: 1.0000 | Freq: Four times a day (QID) | 3 refills | Status: DC

## 2021-09-27 MED ORDER — LEVEMIR FLEXTOUCH 100 UNIT/ML ~~LOC~~ SOPN: 12.0000 [IU] | PEN_INJECTOR | Freq: Every day | SUBCUTANEOUS | 3 refills | Status: DC

## 2021-09-27 NOTE — Progress Notes (Signed)
Name: Scott [name]  MRN/ DOB: [id number], 03-27-1940   Age/ Sex: 81 y.o., male    PCP: [name], MD   Reason for Endocrinology Evaluation: Type 2 Diabetes Mellitus     Date of Initial Endocrinology Visit: 09/27/2021     PATIENT IDENTIFIER: Scott [name] is a 81 y.o. male with a past medical history of T2DM, CLL and hypothyroidism. The patient presented for initial endocrinology clinic visit on 09/27/2021 for consultative assistance with his diabetes management.    HPI: Scott [name] was    Diagnosed with DM 1990 Prior Medications tried/Intolerance: metformin , glipizide  Currently checking blood sugars multiple  x / day,  through freestyle libre .  Hypoglycemia episodes : yes               Symptoms: yes                 Frequency:1 /week Hemoglobin A1c has ranged from 6.9% in 2019, peaking at 8.6% in 2023. Patient required assistance for hypoglycemia:  Patient has required hospitalization within the last 1 year from hyper or hypoglycemia:   In terms of diet, the patient eats 2 meals a day , snacks for lunch    Patient with pancreatic insufficiency and is on Creon Patient follows with cardiology for CAD He also follows with oncology for leukemia  HOME DIABETES REGIMEN: Levemir 14 units daily  NovoLog 5 units with breakfast,     CONTINUOUS GLUCOSE MONITORING RECORD INTERPRETATION    Dates of Recording: 7/14-7/27/2023  Sensor description:freestyle libre  Results statistics:   CGM use % of time 98  Average and SD 193/22.8  Time in range      40  %  % Time Above 180 50  % Time above 250 10  % Time Below target 0    Glycemic patterns summary: Hyperglycemia during the day and night  Hyperglycemic episodes   mostly post-prandial  Hypoglycemic episodes occurred post bolus       DIABETIC COMPLICATIONS: Microvascular complications:  CKD III, neuropathy Denies: retinopathy Last eye exam: Completed 09/2021  Macrovascular complications:  CAD Denies:  PVD, CVA   PAST HISTORY: Past Medical History:  Past Medical History:  Diagnosis Date   Acquired hand deformity 1962   hand saw accident at work   Anemia 10/11/2011   Arthritis    Bradycardia 10/10/2011   CAD (coronary artery disease) 10/10/2011   MI s/p PTCA (Dx-OM2 proximal concentric stenosis)   Carotid artery disease (HCC)    Cellulitis of buttock, left 11/17/2018   Chronic edema    CKD (chronic kidney disease) stage 3, GFR 30-59 ml/min (HCC)    [name]   CLL (chronic lymphocytic leukemia) ([place])    Colon polyps    Cough 04/28/2019   Dysphagia    EGD - reflux esophagitis, one area of stomach with focal intestinal metaplasia (06/2019) Dr Marius Ditch   Generalized headaches    frequent   GI bleed 07/08/2017   Glaucoma    s/p laser surgery   HLD (hyperlipidemia)    Hyperkalemia 02/28/2018   Hypertension    Hypothyroidism 10/10/2011   Iron deficiency anemia due to chronic blood loss    Squamous cell carcinoma of skin 06/15/2019   Right posterior ear. SCCis, hypertrophic, crusted   Thrombocytopenia ([place]) 10/11/2011   Type 2 diabetes with nephropathy [facility])    DM refresher course [facility] (04/2013)   Past Surgical History:  Past Surgical History:  Procedure Laterality Date   BACK SURGERY  cervical neck   CATARACT EXTRACTION, BILATERAL Bilateral 2017   COLONOSCOPY  11/2008   1 polyp, diverticulosis, rec rpt 5 yrs (Dr. Oletta Lamas, [name])   COLONOSCOPY  06/2014   hyperplastic polyp, rpt 5 yrs Oletta Lamas)   COLONOSCOPY WITH PROPOFOL N/A 11/17/2017   TA, HP, (Vanga, Tally Due, MD)   ESOPHAGOGASTRODUODENOSCOPY (EGD) WITH PROPOFOL N/A 11/17/2017   healing erosive gastritis, intestinal metaplasia, neg H pylori (Vanga, Tally Due, MD)   ESOPHAGOGASTRODUODENOSCOPY (EGD) WITH PROPOFOL N/A 06/28/2019   erosive gastropathy with stigmata of recent bleed, granular tissue in esophagus biopsied- reflux esophagitis, small HH (Vanga, Tally Due, MD)   EYE SURGERY  2012   laser surgery for glaucoma    PORTACATH PLACEMENT Left 12/09/2018   Procedure: INSERTION PORT-A-CATH;  Surgeon: [name], MD;  Location: [facility] ORS;  Service: General;  Laterality: Left;   PTCA  1994, 1995   US ECHOCARDIOGRAPHY  10/2013   inferior wall hypokinesis, mild LVH, EF 50-55%, mild MR and LA dilation    Social History:  reports that he quit smoking about 43 years ago. His smoking use included cigarettes. He has never used smokeless tobacco. He reports that he does not drink alcohol and does not use drugs. Family History:  Family History  Problem Relation Age of Onset   Diabetes Sister    Stomach cancer Sister 68   Pneumonia Father        caused death   Other Brother        no communication with brother so unsure of any health conditions   Coronary artery disease Son 23       5v CABG and stents   Hyperlipidemia Sister    Stroke Neg Hx    Heart attack Neg Hx      HOME MEDICATIONS: Allergies as of 09/27/2021       Reactions   Lisinopril Other (See Comments)   Hyperkalemia even at '5mg'$  dose        Medication List        Accurate as of September 27, 2021  9:57 AM. If you have any questions, ask your nurse or doctor.          acetaminophen 650 MG CR tablet Commonly known as: TYLENOL Take 1,300 mg by mouth 2 (two) times daily.   amLODipine 10 MG tablet Commonly known as: NORVASC Take 1 tablet (10 mg total) by mouth daily.   atorvastatin 80 MG tablet Commonly known as: LIPITOR TAKE 1 TABLET BY MOUTH ON MONDAY, WEDNESDAY AND FRIDAY   carvedilol 6.25 MG tablet Commonly known as: COREG TAKE 1 TABLET BY MOUTH TWICE DAILY WITH MEALS   ezetimibe 10 MG tablet Commonly known as: ZETIA Take 1 tablet (10 mg total) by mouth daily.   hydrochlorothiazide 12.5 MG capsule Commonly known as: MICROZIDE Take 1 capsule by mouth once daily   Levemir FlexTouch 100 UNIT/ML FlexPen Generic drug: insulin detemir Inject 14 Units into the skin daily. What changed: how much to take   levothyroxine 88 MCG  tablet Commonly known as: SYNTHROID Take 1 tablet (88 mcg total) by mouth daily.   lipase/protease/amylase 36000 UNITS Cpep capsule Commonly known as: Creon Take 2 capsules with the first bite of each meal and 1 capsule with the first bite of each snack   meclizine 12.5 MG tablet Commonly known as: ANTIVERT TAKE 1 TABLET BY MOUTH TWICE DAILY AS NEEDED FOR  DIZZINESS  (SEDATION  PRECAUTIONS)   nitroGLYCERIN 0.4 MG SL tablet Commonly known as: NITROSTAT Place  1 tablet (0.4 mg total) under the tongue every 5 (five) minutes as needed for chest pain ((MAX of 3 doses)).   NovoLOG FlexPen 100 UNIT/ML FlexPen Generic drug: insulin aspart INJECT 5 TO 8 UNITS SUBCUTANEOUSLY THREE TIMES DAILY WITH MEALS   oxymetazoline 0.05 % nasal spray Commonly known as: AFRIN Place 1 spray into left nostril daily as needed (nose bleeds).   traZODone 100 MG tablet Commonly known as: DESYREL TAKE 1 TABLET BY MOUTH AT BEDTIME AS NEEDED FOR SLEEP   triamcinolone ointment 0.5 % Commonly known as: KENALOG Apply 1 application topically 2 (two) times daily.         ALLERGIES: Allergies  Allergen Reactions   Lisinopril Other (See Comments)    Hyperkalemia even at '5mg'$  dose     REVIEW OF SYSTEMS: A comprehensive ROS was conducted with the patient and is negative except as per HPI and below:  Review of Systems  Gastrointestinal:  Negative for diarrhea, nausea and vomiting.  Neurological:  Positive for tingling.      OBJECTIVE:   VITAL SIGNS: BP (!) 144/86   Pulse 61   Temp (!) 97.5 F (36.4 C)   Ht '5\' 11"'$  (1.803 m)   Wt 151 lb (68.5 kg)   SpO2 97%   BMI 21.06 kg/m    PHYSICAL EXAM:  General: Pt appears well and is in NAD  Neck: General: Supple without adenopathy or carotid bruits. Thyroid: Thyroid size normal.  No goiter or nodules appreciated.   Lungs: Clear with good BS bilat with no rales, rhonchi, or wheezes  Heart: RRR with normal S1 and S2 and no gallops; no murmurs; no rub   Abdomen: Normoactive bowel sounds, soft, nontender, without masses or organomegaly palpable  Extremities:  Lower extremities - No pretibial edema. No lesions.  Neuro: MS is good with appropriate affect, pt is alert and Ox3    DM foot exam: 09/27/2021  The skin of the feet is intact without sores or ulcerations. The pedal pulses are 2+ on right and 2+ on left. The sensation is decreased to a screening 5.07, 10 gram monofilament bilaterally at the heels    DATA REVIEWED:  Lab Results  Component Value Date   HGBA1C 7.8 (A) 08/10/2021   HGBA1C 8.6 (H) 05/09/2021   HGBA1C 8.0 (A) 12/18/2020   Lab Results  Component Value Date   MICROALBUR 29.0 (H) 02/22/2019   LDLCALC 99 04/12/2020   CREATININE 1.70 (H) 06/19/2021   Lab Results  Component Value Date   MICRALBCREAT 21.6 02/22/2019    Lab Results  Component Value Date   CHOL 131 05/09/2021   HDL 44.60 05/09/2021   LDLCALC 99 04/12/2020   LDLDIRECT 69.0 05/09/2021   TRIG 255.0 (H) 05/09/2021   CHOLHDL 3 05/09/2021        ASSESSMENT / PLAN / RECOMMENDATIONS:   1) Type 2 Diabetes Mellitus, Sub-Optimally controlled, With CKD III , neuropathic and macrovascular complications - Most recent A1c of 7.8 %. Goal A1c < 7.5 %.    -Diabetes is felt to be pancreatic in origin -His A1c has been trending down, but the patient has severe fear of hypoglycemia, he will not go to bed without a BG of 200 mg/DL -In review of his CGM download the patient has been noted with no hypoglycemia overnight, but his BG's tend to drop more than 30 points overnight, he has been noted with post bolus hypoglycemia -I am going to reduce his basal insulin -He will also be given a  standing dose of NovoLog with each meal -I have also provided him with a correction scale, he did initially have difficulty understanding the concept but I explained it to him a few times and I hope it is easy to use, if this remains an issue I will simplify it -I have emphasized  the importance of taking prandial dose of insulin immediately before the meal, it appears that he sometimes skips the prandial dose of insulin and waits until after he eats and he gives himself extra for postprandial hyperglycemia which results in hypoglycemia    MEDICATIONS: Decrease Levemir to 12 units daily Change NovoLog 5 units with breakfast and 4 units with supper Correction factor: NovoLog (BG -140/60)  EDUCATION / INSTRUCTIONS: BG monitoring instructions: Patient is instructed to check his blood sugars 3 times a day, before meals. Call [facility] if: BG persistently < 70  I reviewed the Rule of 15 for the treatment of hypoglycemia in detail with the patient. Literature supplied.   2) Diabetic complications:  Eye: Does not have known diabetic retinopathy.  Neuro/ Feet: Does  have known diabetic peripheral neuropathy. Renal: Patient does  have known baseline CKD. He is not on an ACEI/ARB at present.patient is intolerant to lisinopril  Follow-up in 3 months   Signed electronically by: Mack Guise, MD  [facility]  [facility] [address] [place], [place] [address] Phone: [phone] FAX: [phone]   CC: [name], MD [place] [address] Phone: [phone]  Fax: [phone]    Return to Endocrinology clinic as below: Future Appointments  Date Time Provider [facility]  09/27/2021 10:10 AM Quanna Wittke, Melanie Crazier, MD [facility] None  10/02/2021  1:15 PM Lin Landsman, MD [facility] None  11/19/2021 10:30 AM [facility] None  11/19/2021 11:00 AM Verlon Au, NP [facility] None  12/10/2021 10:00 AM [name], MD [facility]

## 2021-09-27 NOTE — Patient Instructions (Signed)
Decrease Levemir to 12 units daily  Novolog 5 units with Breakfast and 4 units with Supper  Novolog correctional insulin: ADD extra units on insulin to your meal-time Novolog dose if your blood sugars are higher than 200. Use the scale below to help guide you:   Blood sugar before meal Number of units to inject  Less than 200 0 unit  201 -  260 1 units  261 -  320 2 units  321 -  380 3 units  381 -  440 4 units   HOW TO TREAT LOW BLOOD SUGARS (Blood sugar LESS THAN 70 MG/DL) Please follow the RULE OF 15 for the treatment of hypoglycemia treatment (when your (blood sugars are less than 70 mg/dL)   STEP 1: Take 15 grams of carbohydrates when your blood sugar is low, which includes:  3-4 GLUCOSE TABS  OR 3-4 OZ OF JUICE OR REGULAR SODA OR ONE TUBE OF GLUCOSE GEL    STEP 2: RECHECK blood sugar in 15 MINUTES STEP 3: If your blood sugar is still low at the 15 minute recheck --> then, go back to STEP 1 and treat AGAIN with another 15 grams of carbohydrates.

## 2021-09-28 NOTE — Patient Instructions (Signed)
Visit Information  Phone number for Pharmacist: [phone]   Goals Addressed   None     Care Plan : [place]  Updates made by [name], RPH since 09/28/2021 12:00 AM     Problem: Hypertension, Hyperlipidemia, Diabetes, and Coronary Artery Disease   Priority: High     Long-Range Goal: Disease mgmt   Start Date: 06/29/2021  Expected End Date: 06/30/2022  This Visit's Progress: On track  Recent Progress: On track  Priority: High  Note:   Current Barriers:  None identified  Pharmacist Clinical Goal(s):  Patient will contact provider office for questions/concerns as evidenced notation of same in electronic health record through collaboration with PharmD and provider.   Interventions: 1:1 collaboration with [name], MD regarding development and update of comprehensive plan of care as evidenced by provider attestation and co-signature Inter-disciplinary care team collaboration (see longitudinal plan of care) Comprehensive medication review performed; medication list updated in electronic medical record  Hypertension (BP goal <140/90) -Controlled - home BP at goal -Current home BP readings: 144/84 -Current treatment: Carvedilol 6.25 mg BID - Appropriate, Effective, Safe, Accessible HCTZ 12.5 mg daily - Appropriate, Effective, Safe, Accessible Amlodipine 10 mg daily - Appropriate, Effective, Safe, Accessible -Medications previously tried: lisinopril (hyperK) -Educated on BP goals and benefits of medications for prevention of heart attack, stroke and kidney damage; -Counseled to monitor BP at home daily -Recommended to continue current medication  Hyperlipidemia / CAD: (LDL goal < 70) -Controlled - LDL 69 (05/2021) at goal -Hx CAD. Intolerant to aspirin (nosebleeds requiring ED visits) -Current treatment: Atorvastatin 80 mg MWF - Appropriate, Effective, Safe, Accessible Ezetimibe 10 mg daily - Appropriate, Effective, Safe,  Accessible -Medications previously tried: n/a  -Educated on Cholesterol goals; Benefits of statin for ASCVD risk reduction; -Recommended to continue current medication  Diabetes (A1c goal <8%; TIR > 70%) -Controlled - A1c 7.8% (08/2021); he has not had as many low sugars lately -Upcoming Endo appt with Dr [name] ([facility]) for mgmt of brittle DM -Current home glucose readings: 161 today (fasting); using [employer] with phone, not connected to [facility] at this time -Current medications: Levemir 14 units daily AM - Appropriate, Effective, Safe, Accessible Novolog 5-8 units TID w/ meals - Appropriate, Effective, Safe, Accessible Freestyle Libre 2 - Appropriate, Effective, Safe, Accessible -Medications previously tried: n/a -Current meal patterns: eats egg, toast, bacon - sugar jumped 200 pts -Educated on A1c and blood sugar goals; Complications of diabetes including kidney damage, retinal damage, and cardiovascular disease; Continuous glucose monitoring; -Recommended to continue current medication; keep endo appt 7/27  Hypothyroidism (Goal: maintain TSH in goal range) -Controlled - TSH within goal range -Current treatment  Levothyroxine 88 mcg daily - Appropriate, Effective, Safe, Accessible -Medications previously tried: n/a  -Recommended to continue current medication  Pancreatic insufficiency (Goal: manage symptoms) -Controlled - per pt report; he is getting Creon through PAP -Current treatment  Creon 36000 unit - 2 cap w/ meals, 1 cap w/ snacks (PAP) - Appropriate, Effective, Safe, Accessible -Medications previously tried: n/a  -Recommended to continue current medication  Health Maintenance -Vaccine gaps: none -Hx CLL. Following with oncology. S/p Rituxan 2020. -Current therapy:  Trazodone 100 mg HS prn sleep Afrin nasal spray PRN Tylenol 650 mg PRN Hydroxyurea 500 mg daily - not taking. Removed from list. Meclizine 12.5 mg PRN Multivitamin (Centrum Silver Mens  50+) -Patient is satisfied with current therapy and denies issues -Recommended to continue current medication  Patient Goals/Self-Care Activities Patient will:  -  take medications as prescribed as evidenced by patient report and record review focus on medication adherence by pill box check glucose w/ Freestyle Libre, document, and provide at future appointments check blood pressure daily, document, and provide at future appointments       Patient verbalizes understanding of instructions and care plan provided today and agrees to view in [place]. Active MyChart status and patient understanding of how to access instructions and care plan via MyChart confirmed with patient.    Telephone follow up appointment with pharmacy team member scheduled for: 6 months  Charlene Brooke, PharmD, [facility] Clinical Pharmacist [facility] [phone]

## 2021-10-01 ENCOUNTER — Encounter: Payer: Self-pay | Admitting: Internal Medicine

## 2021-10-01 DIAGNOSIS — E1169 Type 2 diabetes mellitus with other specified complication: Secondary | ICD-10-CM | POA: Diagnosis not present

## 2021-10-01 DIAGNOSIS — E785 Hyperlipidemia, unspecified: Secondary | ICD-10-CM | POA: Diagnosis not present

## 2021-10-01 DIAGNOSIS — I10 Essential (primary) hypertension: Secondary | ICD-10-CM

## 2021-10-01 DIAGNOSIS — I2510 Atherosclerotic heart disease of native coronary artery without angina pectoris: Secondary | ICD-10-CM

## 2021-10-01 DIAGNOSIS — N1832 Chronic kidney disease, stage 3b: Secondary | ICD-10-CM

## 2021-10-01 DIAGNOSIS — E1122 Type 2 diabetes mellitus with diabetic chronic kidney disease: Secondary | ICD-10-CM

## 2021-10-01 DIAGNOSIS — Z794 Long term (current) use of insulin: Secondary | ICD-10-CM | POA: Diagnosis not present

## 2021-10-02 ENCOUNTER — Ambulatory Visit: Payer: Medicare Other | Admitting: Gastroenterology

## 2021-10-02 ENCOUNTER — Encounter: Payer: Self-pay | Admitting: Gastroenterology

## 2021-10-02 VITALS — BP 145/73 | HR 57 | Temp 97.9°F | Ht 71.0 in | Wt 151.5 lb

## 2021-10-02 DIAGNOSIS — K8681 Exocrine pancreatic insufficiency: Secondary | ICD-10-CM

## 2021-10-02 NOTE — Progress Notes (Signed)
[name], MD [address]  [place]  [place], [place] [address]  Main: [phone]  Fax: [phone]    Gastroenterology Consultation  Referring Provider:     [name], MD Primary Care Physician:  [name], MD Primary Gastroenterologist:  Dr. [name] Reason for Consultation:   Follow-up of EPI, weight loss        HPI:   Scott [name] is a 81 y.o. male referred by Dr. [name], MD  for consultation & management of exocrine pancreatic insufficiency, dysphagia  Follow-up visit 12/07/2020 Patient is here to discuss about central abdominal pain that has been ongoing for few months.  The pain is predominantly postprandial, last for about 2 hours.  He lost about 10 pounds since last year.  Patient does have history of diffuse pancreatic atrophy with partial fatty replacement based on CT in 08/2017.  Patient denies any loose stools or abdominal bloating.  His CBC, CMP have been stable.  Patient has not been taking PPI.  Patient denies any heartburn, epigastric pain  Follow-up visit 04/04/2021 Since last visit, patient is diagnosed with exocrine pancreatic insufficiency and have started him on Creon 36K 2 capsules with each meal and 1 with snack.  His pancreatic fecal elastase levels were less than 50.  Patient reports that he is no longer experiencing postprandial bloating, diarrhea or central abdominal pain.  Patient also reports that he is no longer experiencing difficulty swallowing.  He finished the course of omeprazole.  Despite being asymptomatic, patient lost about 5 to 6 pounds since last visit.  He states that his blood sugars have been running in 290s postprandial.  He is on insulin.  He has blood sugar log on his phone which she shared with me today.  Patient does report that he has only 2 meals a day.  Follow-up visit 10/02/2021 Patient is here for follow-up of EPI.  Patient has been doing very well, his weight has been stable since last  visit.  He continues to take pancreatic enzymes.  He is not experiencing diarrhea, reports having good bowel movements daily.  His hemoglobin A1c is improving.  NSAIDs:  none  Antiplts/Anticoagulants/Anti thrombotics: none  GI Procedures: underwent colonoscopy in 2010, subcentimeter polyps were removed Underwent colonoscopy in 2016 in [place], 10 mm polyp from ascending colon was removed  EGD and colonoscopy 11/2017 - Multiple non-bleeding duodenal ulcers with a clean ulcer base (For[place] Class III). - Non-bleeding erosive gastropathy. - Non-bleeding erosive gastropathy. Biopsied. - Small hiatal hernia. - Normal gastroesophageal junction and esophagus.  - Two 5 to 7 mm polyps in the transverse colon and in the cecum, removed with a hot snare. Resected and retrieved. - Diverticulosis in the sigmoid colon, in the transverse colon and in the ascending colon. - Increased mucosa vascular pattern in the rectum, prominent rectal veins - Normal mucosa in the entire examined colon.  DIAGNOSIS:   A. STOMACH, RANDOM; COLD BIOPSY:  - ANTRAL MUCOSA WITH REACTIVE AND HEALING EROSIVE GASTRITIS; FOCAL  INTESTINAL METAPLASIA IS PRESENT.  - OXYNTIC MUCOSA WITH MODERATE CHRONIC GASTRITIS AND CHANGES CONSISTENT  WITH PROTON PUMP INHIBITOR EFFECT.  - IHC FOR HELICOBACTER IS NEGATIVE.  - NEGATIVE FOR DYSPLASIA AND MALIGNANCY.   B.  COLON POLYP, CECUM; HOT SNARE:  - TUBULAR ADENOMA.  - NEGATIVE FOR HIGH-GRADE DYSPLASIA AND MALIGNANCY.   C.  COLON POLYP, TRANSVERSE; HOT SNARE:  - HYPERPLASTIC POLYP.  - NEGATIVE FOR DYSPLASIA AND MALIGNANCY.   Upper endoscopy 06/28/2019 -  Normal duodenal bulb and second portion of the duodenum. - Erosive gastropathy with stigmata of recent bleeding. Biopsied. - Normal gastric body. Biopsied. - A medium amount of food (residue) in the stomach. - Small hiatal hernia. - Granular mucosa in the esophagus. Biopsied. - Normal esophagus.  DIAGNOSIS:  A.  STOMACH,  INCISURA; COLD BIOPSY:  - ANTRAL-TYPE AND TRANSITIONAL-TYPE MUCOSA WITH CONGESTION AND MILD  REACTIVE FOVEOLAR HYPERPLASIA.  - NEGATIVE FOR H. PYLORI, INTESTINAL METAPLASIA, DYSPLASIA, AND  MALIGNANCY.   B.  STOMACH, ANTRUM GREATER CURVATURE; COLD BIOPSY:  - OXYNTIC AND ANTRAL MUCOSA WITH MILD CHRONIC INFLAMMATION.  - NEGATIVE FOR H. PYLORI, INTESTINAL METAPLASIA, DYSPLASIA, AND  MALIGNANCY.   C.  STOMACH, ANTRUM LESSER CURVATURE; COLD BIOPSY:  - OXYNTIC MUCOSA WITH EDEMA.  - NEGATIVE FOR H. PYLORI, INTESTINAL METAPLASIA, DYSPLASIA, AND  MALIGNANCY.   D.  STOMACH, BODY GREATER CURVATURE; COLD BIOPSY:  - OXYNTIC MUCOSA WITH FOCAL INTESTINAL METAPLASIA INVOLVING 10% OF ONE  OF TWO FRAGMENTS, SEE COMMENT.  - NEGATIVE FOR H. PYLORI, DYSPLASIA, AND MALIGNANCY.   E.  STOMACH, BODY LESSER CURVATURE; COLD BIOPSY:  - OXYNTIC MUCOSA WITH EDEMA.  - NEGATIVE FOR H. PYLORI, INTESTINAL METAPLASIA, DYSPLASIA, AND  MALIGNANCY.   F.  GASTROESOPHAGEAL JUNCTION; COLD BIOPSY:  - SQUAMOUS-LINED MUCOSA WITH PARAKERATOSIS AND ACTIVE INFLAMMATION,  CONSISTENT WITH REFLUX ESOPHAGITIS.  - COLUMNAR-LINED MUCOSA WITH MILD CHRONIC INFLAMMATION AND REACTIVE  FOVEOLAR HYPERPLASIA.  - NEGATIVE FOR INTESTINAL METAPLASIA (GOBLET CELLS), DYSPLASIA, AND  MALIGNANCY.   Past Medical History:  Diagnosis Date   Acquired hand deformity 1962   hand saw accident at work   Anemia 10/11/2011   Arthritis    Bradycardia 10/10/2011   CAD (coronary artery disease) 10/10/2011   MI s/p PTCA (Dx-OM2 proximal concentric stenosis)   Carotid artery disease (HCC)    Cellulitis of buttock, left 11/17/2018   Chronic edema    CKD (chronic kidney disease) stage 3, GFR 30-59 ml/min (HCC)    [name]   CLL (chronic lymphocytic leukemia) ([place])    Colon polyps    Cough 04/28/2019   Dysphagia    EGD - reflux esophagitis, one area of stomach with focal intestinal metaplasia (06/2019) Dr Marius Ditch   Generalized headaches    frequent   GI  bleed 07/08/2017   Glaucoma    s/p laser surgery   HLD (hyperlipidemia)    Hyperkalemia 02/28/2018   Hypertension    Hypothyroidism 10/10/2011   Iron deficiency anemia due to chronic blood loss    Squamous cell carcinoma of skin 06/15/2019   Right posterior ear. SCCis, hypertrophic, crusted   Thrombocytopenia ([place]) 10/11/2011   Type 2 diabetes with nephropathy [facility])    DM refresher course [place] (04/2013)    Past Surgical History:  Procedure Laterality Date   BACK SURGERY     cervical neck   CATARACT EXTRACTION, BILATERAL Bilateral 2017   COLONOSCOPY  11/2008   1 polyp, diverticulosis, rec rpt 5 yrs (Dr. Oletta Lamas, [name])   COLONOSCOPY  06/2014   hyperplastic polyp, rpt 5 yrs Oletta Lamas)   COLONOSCOPY WITH PROPOFOL N/A 11/17/2017   TA, HP, (Fed Ceci, Tally Due, MD)   ESOPHAGOGASTRODUODENOSCOPY (EGD) WITH PROPOFOL N/A 11/17/2017   healing erosive gastritis, intestinal metaplasia, neg H pylori (Deija Buhrman, Tally Due, MD)   ESOPHAGOGASTRODUODENOSCOPY (EGD) WITH PROPOFOL N/A 06/28/2019   erosive gastropathy with stigmata of recent bleed, granular tissue in esophagus biopsied- reflux esophagitis, small HH (Wilho Sharpley, Tally Due, MD)   EYE SURGERY  2012   laser surgery for  glaucoma   PORTACATH PLACEMENT Left 12/09/2018   Procedure: INSERTION PORT-A-CATH;  Surgeon: [name], MD;  Location: [facility] ORS;  Service: General;  Laterality: Left;   PTCA  1994, 1995   US ECHOCARDIOGRAPHY  10/2013   inferior wall hypokinesis, mild LVH, EF 50-55%, mild MR and LA dilation    Current Outpatient Medications:    acetaminophen (TYLENOL) 650 MG CR tablet, Take 1,300 mg by mouth 2 (two) times daily., Disp: , Rfl:    amLODipine (NORVASC) 10 MG tablet, Take 1 tablet (10 mg total) by mouth daily., Disp: 90 tablet, Rfl: 3   atorvastatin (LIPITOR) 80 MG tablet, TAKE 1 TABLET BY MOUTH ON MONDAY, WEDNESDAY AND FRIDAY, Disp: 39 tablet, Rfl: 3   carvedilol (COREG) 6.25 MG tablet, TAKE 1 TABLET BY MOUTH TWICE DAILY WITH MEALS,  Disp: 180 tablet, Rfl: 3   ezetimibe (ZETIA) 10 MG tablet, Take 1 tablet (10 mg total) by mouth daily., Disp: 90 tablet, Rfl: 3   hydrochlorothiazide (MICROZIDE) 12.5 MG capsule, Take 1 capsule by mouth once daily, Disp: 30 capsule, Rfl: 2   insulin detemir (LEVEMIR FLEXTOUCH) 100 UNIT/ML FlexPen, Inject 12 Units into the skin daily., Disp: 15 mL, Rfl: 3   Insulin Pen Needle 32G X 4 MM MISC, 1 Device by Does not apply route in the morning, at noon, in the evening, and at bedtime., Disp: 400 each, Rfl: 3   levothyroxine (SYNTHROID) 88 MCG tablet, Take 1 tablet (88 mcg total) by mouth daily., Disp: 90 tablet, Rfl: 3   lipase/protease/amylase (CREON) 36000 UNITS CPEP capsule, Take 2 capsules with the first bite of each meal and 1 capsule with the first bite of each snack, Disp: 240 capsule, Rfl: 11   meclizine (ANTIVERT) 12.5 MG tablet, TAKE 1 TABLET BY MOUTH TWICE DAILY AS NEEDED FOR  DIZZINESS  (SEDATION  PRECAUTIONS), Disp: 20 tablet, Rfl: 0   nitroGLYCERIN (NITROSTAT) 0.4 MG SL tablet, Place 1 tablet (0.4 mg total) under the tongue every 5 (five) minutes as needed for chest pain ((MAX of 3 doses))., Disp: 25 tablet, Rfl: 0   NOVOLOG FLEXPEN 100 UNIT/ML FlexPen, Max daily 20 units per scale, Disp: 15 mL, Rfl: 6   oxymetazoline (AFRIN) 0.05 % nasal spray, Place 1 spray into left nostril daily as needed (nose bleeds)., Disp: , Rfl:    traZODone (DESYREL) 100 MG tablet, TAKE 1 TABLET BY MOUTH AT BEDTIME AS NEEDED FOR SLEEP, Disp: 90 tablet, Rfl: 0   triamcinolone ointment (KENALOG) 0.5 %, Apply 1 application topically 2 (two) times daily., Disp: 30 g, Rfl: 1 No current facility-administered medications for this visit.  Facility-Administered Medications Ordered in Other Visits:    sodium chloride flush (NS) 0.9 % injection 10 mL, 10 mL, Intravenous, Once, Finnegan, Kathlene November, MD    Family History  Problem Relation Age of Onset   Diabetes Sister    Stomach cancer Sister 67   Pneumonia Father         caused death   Other Brother        no communication with brother so unsure of any health conditions   Coronary artery disease Son 9       5v CABG and stents   Hyperlipidemia Sister    Stroke Neg Hx    Heart attack Neg Hx      Social History   Tobacco Use   Smoking status: Former    Types: Cigarettes    Quit date: 03/04/1978    Years since quitting: 82.6  Smokeless tobacco: Never  Vaping Use   Vaping Use: Never used  Substance Use Topics   Alcohol use: No   Drug use: No    Allergies as of 10/02/2021 - Review Complete 10/02/2021  Allergen Reaction Noted   Lisinopril Other (See Comments) 05/16/2021    Review of Systems:    All systems reviewed and negative except where noted in HPI.   Physical Exam:  BP (!) 145/73 (BP Location: Left Arm, Patient Position: Sitting, Cuff Size: Normal)   Pulse (!) 57   Temp 97.9 F (36.6 C) (Oral)   Ht '5\' 11"'$  (1.803 m)   Wt 151 lb 8 oz (68.7 kg)   BMI 21.13 kg/m  No LMP for male patient.  General:   Alert,  Well-developed, well-nourished, pleasant and cooperative in NAD Head:  Normocephalic and atraumatic. Eyes:  Sclera clear, no icterus.   Conjunctiva pink. Ears:  Normal auditory acuity, lesion behind the right auricle. Nose:  No deformity, discharge, or lesions. Mouth:  No deformity or lesions,oropharynx pink & moist. Neck:  Supple; no masses or thyromegaly. Lungs:  Respirations even and unlabored.  Clear throughout to auscultation.   No wheezes, crackles, or rhonchi. No acute distress. Heart:  Regular rate and rhythm; no murmurs, clicks, rubs, or gallops. Abdomen:  Normal bowel sounds. Soft, mild periumbilical tenderness and non-distended without masses, hepatosplenomegaly or hernias noted.  No guarding or rebound tenderness.   Rectal: Not performed Msk:  Symmetrical without gross deformities. Good, equal movement & strength bilaterally. Pulses:  Normal pulses noted. Extremities:  No clubbing or edema.  No  cyanosis. Neurologic:  Alert and oriented x3;  grossly normal neurologically. Skin: Dry scaly skin with maculopapular rash, petechiae no jaundice. Lymph Nodes:  No significant cervical adenopathy. Psych:  Alert and cooperative. Normal mood and affect.  Imaging Studies: No abdominal imaging  Assessment and Plan:   CHRISHON MARTINO is a 81 y.o. Caucasian male with CLL with anemia and Thrombocytopenia, s/p rituximab, history of iron deficiency anemia which has currently resolved, history of focal intestinal metaplasia of the stomach, duodenal ulcers which have resolved, no evidence of H. pylori, history of tubular adenoma of colon is seen for follow-up of central abdominal pain with weight loss.   Central abdominal pain with weight loss: Secondary to severe EPI, weight loss has stabilized History of diffuse pancreatic atrophy and fatty pancreas pancreatic fecal elastase levels less than 50 Continue Creon 36K 2 capsules with each meal and 1 with snack  Patient is no longer iron deficient.  He does have mild normocytic anemia secondary to underlying CLL  Focal intestinal metaplasia in stomach: Only 1 fragment H. pylori IgG normal  Dysphagia: Currently resolved EGD revealed normal esophagus.  GE junction biopsies revealed reflux esophagitis.  Treated with omeprazole 40 mg daily before breakfast.  Patient is no longer on acid suppressive therapy Monitor for now  Personal history of colon adenoma in 2019: Repeat colonoscopy in 2024  Follow up annually   [name], MD

## 2021-10-04 ENCOUNTER — Telehealth: Payer: Self-pay

## 2021-10-04 NOTE — Telephone Encounter (Signed)
Spoke with the patient to inform him that his DOB is incorrect on his Libreview app and will need to be corrected. Once corrected we will be able to rescan his CGM download.

## 2021-10-29 ENCOUNTER — Other Ambulatory Visit: Payer: Self-pay | Admitting: Family Medicine

## 2021-11-08 ENCOUNTER — Other Ambulatory Visit: Payer: Self-pay | Admitting: Family Medicine

## 2021-11-08 ENCOUNTER — Ambulatory Visit (INDEPENDENT_AMBULATORY_CARE_PROVIDER_SITE_OTHER): Payer: Medicare Other | Admitting: Family Medicine

## 2021-11-08 ENCOUNTER — Encounter: Payer: Self-pay | Admitting: Family Medicine

## 2021-11-08 VITALS — BP 130/90 | HR 62 | Temp 97.8°F | Ht 71.0 in | Wt 151.0 lb

## 2021-11-08 DIAGNOSIS — I6523 Occlusion and stenosis of bilateral carotid arteries: Secondary | ICD-10-CM | POA: Diagnosis not present

## 2021-11-08 DIAGNOSIS — M5137 Other intervertebral disc degeneration, lumbosacral region: Secondary | ICD-10-CM

## 2021-11-08 DIAGNOSIS — Z794 Long term (current) use of insulin: Secondary | ICD-10-CM | POA: Diagnosis not present

## 2021-11-08 DIAGNOSIS — E1142 Type 2 diabetes mellitus with diabetic polyneuropathy: Secondary | ICD-10-CM

## 2021-11-08 DIAGNOSIS — M545 Low back pain, unspecified: Secondary | ICD-10-CM | POA: Diagnosis not present

## 2021-11-08 MED ORDER — PREDNISONE 10 MG PO TABS: ORAL_TABLET | ORAL | 0 refills | Status: DC

## 2021-11-08 NOTE — Progress Notes (Signed)
Breslin Burklow T. Brandyn Thien, MD, [place] at [facility] [place], [address]  Phone: [phone]  FAX: [place] - 81 y.o. male  MRN [id number]  Date of Birth: [birth year]  Date: 11/08/2021  PCP: [name], MD  Referral: [name], MD  Chief Complaint  Patient presents with   Flank Pain    Left-Catch in left area    Subjective:   Scott [name] is a 81 y.o. very pleasant male patient with Body mass index is 21.06 kg/m. who presents with the following:  LBP with standing, was not that bad.  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  No radiculopathy.    This isolates more to the right side of the lower back without any radicular symptoms.  Is roughly in the area of L5-S1 and some pain in the upper right posterior pelvic region.  He does not have any upper back pain, no CVA tenderness, and he is eating and drinking normally.  Urinating normally.  He does have a prior history of cervical spine surgery many years ago, he is on review of his lumbar spine films from 2 years ago, he does have multilevel degenerative disc disease lumbosacral spine.  Lab Results  Component Value Date   HGBA1C 7.8 (A) 08/10/2021     Review of Systems is noted in the HPI, as appropriate  Objective:   BP (!) 130/90   Pulse 62   Temp 97.8 F (36.6 C) (Oral)   Ht '5\' 11"'$  (1.803 m)   Wt 151 lb (68.5 kg)   SpO2 97%   BMI 21.06 kg/m    Range of motion at  the waist: Flexion, extension, lateral bending and rotation: Mild limitation in forward flexion with retained full extension, lateral bending, and rotational maneuvers.  No echymosis or edema Rises to examination table with mild difficulty Gait: minimally antalgic  Inspection/Deformity: N Paraspinus Tenderness: Right side, L4-S1  B Ankle Dorsiflexion (L5,4): 5/5 B Great Toe Dorsiflexion (L5,4):  5/5 Heel Walk (L5): WNL Toe Walk (S1): WNL Rise/Squat (L4): WNL, mild pain  SENSORY B Medial Foot (L4): WNL B Dorsum (L5): WNL B Lateral (S1): WNL Light Touch: WNL Pinprick: WNL  B SLR, seated: neg B SLR, supine: neg B FABER: neg B Reverse FABER: neg B Greater Troch: NT B Log Roll: neg B Sciatic Notch: NT   Laboratory and Imaging Data: CLINICAL DATA:  Chronic lower back pain   EXAM: LUMBAR SPINE - COMPLETE 4+ VIEW   COMPARISON:  None.   FINDINGS: Five non rib bearing segments of the lumbar spine are identified. There is normal lumbar lordosis. Mild lumbar levoscoliosis, apex left at L3. There is minimal retrolisthesis of L2 upon L3 and minimal anterolisthesis of L4 upon L5, likely degenerative in nature. There is marked intervertebral disc space narrowing and endplate remodeling at D4-0 and L2-3 in keeping with changes of severe degenerative disc disease. Milder degenerative changes are noted at L4-5. Oblique views demonstrate no evidence of pars defect. Atherosclerotic calcifications seen within the abdominal aorta. The paraspinal soft tissues are otherwise unremarkable.   IMPRESSION: 1. Advanced degenerative disc disease at L1-2 and L2-3. Moderate degenerative changes at L4-5. 2. Minimal retrolisthesis of L2 upon L3 and minimal anterolisthesis of L4 upon L5, likely degenerative in nature.     Electronically Signed   By: Fidela Salisbury MD  On: 10/13/2019 21:51  Assessment and Plan:     ICD-10-CM   1. Acute right-sided low back pain without sciatica  M54.50     2. Type 2 diabetes mellitus with diabetic polyneuropathy, with long-term current use of insulin (HCC)  E11.42    Z79.4     3. DDD (degenerative disc disease), lumbosacral  M51.37      Acute on chronic multilevel degenerative disc disease lumbosacral spine with exacerbation.  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  Anticipate this will  resolve some with time, and additional I am going to give him some low-dose oral steroids given his history of diabetes.  He also has a history of some renal impairment, so I think that avoiding NSAIDs also make some sense in this case.  Medication Management during today's office visit: Meds ordered this encounter  Medications   predniSONE (DELTASONE) 10 MG tablet    Sig: 2 tabs po for 4 days, then 1 tab po for 4 days    Dispense:  12 tablet    Refill:  0   There are no discontinued medications.  Orders placed today for conditions managed today: No orders of the defined types were placed in this encounter.   Disposition: No follow-ups on file.  Dragon [employer] One speech-to-text software was used for transcription in this dictation.  Possible transcriptional errors can occur using Editor, commissioning.   Signed,  Maud Deed. Casmere Hollenbeck, MD   Outpatient Encounter Medications as of 11/08/2021  Medication Sig   acetaminophen (TYLENOL) 650 MG CR tablet Take 1,300 mg by mouth 2 (two) times daily.   amLODipine (NORVASC) 10 MG tablet Take 1 tablet (10 mg total) by mouth daily.   atorvastatin (LIPITOR) 80 MG tablet TAKE 1 TABLET BY MOUTH ON MONDAY, WEDNESDAY AND FRIDAY   carvedilol (COREG) 6.25 MG tablet TAKE 1 TABLET BY MOUTH TWICE DAILY WITH MEALS   ezetimibe (ZETIA) 10 MG tablet Take 1 tablet (10 mg total) by mouth daily.   hydrochlorothiazide (MICROZIDE) 12.5 MG capsule Take 1 capsule by mouth once daily   insulin detemir (LEVEMIR FLEXTOUCH) 100 UNIT/ML FlexPen Inject 12 Units into the skin daily.   Insulin Pen Needle 32G X 4 MM MISC 1 Device by Does not apply route in the morning, at noon, in the evening, and at bedtime.   levothyroxine (SYNTHROID) 88 MCG tablet Take 1 tablet (88 mcg total) by mouth daily.   lipase/protease/amylase (CREON) 36000 UNITS CPEP capsule Take 2 capsules with the first bite of each meal and 1 capsule with the first bite of each snack   meclizine (ANTIVERT) 12.5 MG  tablet TAKE 1 TABLET BY MOUTH TWICE DAILY AS NEEDED FOR  DIZZINESS  (SEDATION  PRECAUTIONS)   nitroGLYCERIN (NITROSTAT) 0.4 MG SL tablet Place 1 tablet (0.4 mg total) under the tongue every 5 (five) minutes as needed for chest pain ((MAX of 3 doses)).   NOVOLOG FLEXPEN 100 UNIT/ML FlexPen Max daily 20 units per scale   oxymetazoline (AFRIN) 0.05 % nasal spray Place 1 spray into left nostril daily as needed (nose bleeds).   predniSONE (DELTASONE) 10 MG tablet 2 tabs po for 4 days, then 1 tab po for 4 days   traZODone (DESYREL) 100 MG tablet TAKE 1 TABLET BY MOUTH AT BEDTIME AS NEEDED FOR SLEEP   triamcinolone ointment (KENALOG) 0.5 % Apply 1 application topically 2 (two) times daily.   Facility-Administered Encounter Medications as of 11/08/2021  Medication   sodium chloride flush (NS) 0.9 % injection 10 mL

## 2021-11-09 ENCOUNTER — Telehealth: Payer: Self-pay | Admitting: Family Medicine

## 2021-11-09 MED ORDER — TRAMADOL HCL 50 MG PO TABS: 25.0000 mg | ORAL_TABLET | Freq: Two times a day (BID) | ORAL | 0 refills | Status: AC | PRN

## 2021-11-09 NOTE — Telephone Encounter (Signed)
Pt saw Dr. Lorelei Pont and steroid prescribed, pt states blood sugar was over 500 and you took additional insulin and was  still 275 this morning. Pt did not take steroid today. Wanting to know if there is something different he can take.

## 2021-11-09 NOTE — Telephone Encounter (Signed)
Refill request Trazodone Last refill 08/17/21 #90 Last office visit acute Dr. Lorelei Pont 11/08/21 Upcoming appointment 12/10/21

## 2021-11-09 NOTE — Telephone Encounter (Signed)
Spoke with patient. He was prescribed '10mg'$  prednisone ('20mg'$ /'10mg'$ ). Took '20mg'$  prednisone - shot sugars up to 500s. This did help back.  Given brittle diabetes, rec against prednisone - he will stop this.  Will Rx tramadol in interim PRN for back pain.

## 2021-11-19 ENCOUNTER — Inpatient Hospital Stay (HOSPITAL_BASED_OUTPATIENT_CLINIC_OR_DEPARTMENT_OTHER): Payer: Medicare Other | Admitting: Medical Oncology

## 2021-11-19 ENCOUNTER — Inpatient Hospital Stay: Payer: Medicare Other | Attending: Internal Medicine

## 2021-11-19 ENCOUNTER — Encounter: Payer: Self-pay | Admitting: Medical Oncology

## 2021-11-19 ENCOUNTER — Telehealth: Payer: Self-pay | Admitting: Gastroenterology

## 2021-11-19 VITALS — BP 147/80 | HR 54 | Temp 97.1°F | Wt 140.0 lb

## 2021-11-19 DIAGNOSIS — D696 Thrombocytopenia, unspecified: Secondary | ICD-10-CM

## 2021-11-19 DIAGNOSIS — D649 Anemia, unspecified: Secondary | ICD-10-CM

## 2021-11-19 DIAGNOSIS — C9110 Chronic lymphocytic leukemia of B-cell type not having achieved remission: Secondary | ICD-10-CM | POA: Insufficient documentation

## 2021-11-19 DIAGNOSIS — N289 Disorder of kidney and ureter, unspecified: Secondary | ICD-10-CM

## 2021-11-19 DIAGNOSIS — D693 Immune thrombocytopenic purpura: Secondary | ICD-10-CM | POA: Insufficient documentation

## 2021-11-19 LAB — CBC WITH DIFFERENTIAL/PLATELET
Abs Immature Granulocytes: 0.02 10*3/uL (ref 0.00–0.07)
Basophils Absolute: 0 10*3/uL (ref 0.0–0.1)
Basophils Relative: 1 %
Eosinophils Absolute: 0.1 10*3/uL (ref 0.0–0.5)
Eosinophils Relative: 2 %
HCT: 40.1 % (ref 39.0–52.0)
Hemoglobin: 12.8 g/dL — ABNORMAL LOW (ref 13.0–17.0)
Immature Granulocytes: 1 %
Lymphocytes Relative: 24 %
Lymphs Abs: 1 10*3/uL (ref 0.7–4.0)
MCH: 30 pg (ref 26.0–34.0)
MCHC: 31.9 g/dL (ref 30.0–36.0)
MCV: 94.1 fL (ref 80.0–100.0)
Monocytes Absolute: 0.4 10*3/uL (ref 0.1–1.0)
Monocytes Relative: 10 %
Neutro Abs: 2.7 10*3/uL (ref 1.7–7.7)
Neutrophils Relative %: 62 %
Platelets: 131 10*3/uL — ABNORMAL LOW (ref 150–400)
RBC: 4.26 MIL/uL (ref 4.22–5.81)
RDW: 13.8 % (ref 11.5–15.5)
WBC: 4.3 10*3/uL (ref 4.0–10.5)
nRBC: 0 % (ref 0.0–0.2)

## 2021-11-19 NOTE — Telephone Encounter (Signed)
Pt would like for Dr. Marius Ditch  updated licenses info to be sent in for his creon refill  phone-[phone] fax [phone]

## 2021-11-19 NOTE — Progress Notes (Signed)
[place]  [phone] [phone]  ID: [name] OB: [birth year]  MR#: [id number]  PYP#:[id number]  Patient Care Team: [name], MD as PCP - General (Family Medicine) Dorothy Spark, MD as PCP - Cardiology (Cardiology) Lloyd Huger, MD as Consulting Physician (Oncology) Clent Jacks, MD as Consulting Physician (Ophthalmology) Dorothy Spark, MD as Consulting Physician (Cardiology) Madelon Lips, MD as Consulting Physician (Nephrology) [name], [facility] as Pharmacist (Pharmacist)   CHIEF COMPLAINT: 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  INTERVAL HISTORY: Patient returns to clinic today for repeat laboratory work and routine evaluation. He reports that he is doing well. He is working with an Musician in [place]  Dr. Mammie Lorenzo to help reduce his sugars. He has follow up next month with them. He denies any weakness or fatigue and remains active.  He denies any recent fevers or illnesses.  He has no neurologic complaints.  He has a good appetite.  He denies any night sweats. He has noted no lymphadenopathy.  He has no chest pain, shortness of breath, cough, or hemoptysis.  He denies any nausea, vomiting, constipation, or diarrhea.  He denies any melena or hematochezia.  He has no urinary complaints.  Patient offers no further specific complaints today.  REVIEW OF SYSTEMS:   Review of Systems  Constitutional: Negative.  Negative for fever, malaise/fatigue and weight loss.  HENT: Negative.  Negative for nosebleeds.   Respiratory: Negative.  Negative for cough and shortness of breath.   Cardiovascular: Negative.  Negative for chest pain and leg swelling.  Gastrointestinal: Negative.  Negative for abdominal pain, blood in stool and melena.  Genitourinary: Negative.  Negative for dysuria and hematuria.  Musculoskeletal: Negative.  Negative for back pain and neck pain.  Skin: Negative.  Negative for  itching and rash.  Neurological: Negative.  Negative for dizziness, sensory change, focal weakness, weakness and headaches.  Endo/Heme/Allergies: Negative.  Does not bruise/bleed easily.  Psychiatric/Behavioral: Negative.  The patient is not nervous/anxious.     As per HPI. Otherwise, a complete review of systems is negative.  PAST MEDICAL HISTORY: Past Medical History:  Diagnosis Date   Acquired hand deformity 1962   hand saw accident at work   Anemia 10/11/2011   Arthritis    Bradycardia 10/10/2011   CAD (coronary artery disease) 10/10/2011   MI s/p PTCA (Dx-OM2 proximal concentric stenosis)   Carotid artery disease (HCC)    Cellulitis of buttock, left 11/17/2018   Chronic edema    CKD (chronic kidney disease) stage 3, GFR 30-59 ml/min (HCC)    [name]   CLL (chronic lymphocytic leukemia) ([place])    Colon polyps    Cough 04/28/2019   Dysphagia    EGD - reflux esophagitis, one area of stomach with focal intestinal metaplasia (06/2019) Dr Marius Ditch   Generalized headaches    frequent   GI bleed 07/08/2017   Glaucoma    s/p laser surgery   HLD (hyperlipidemia)    Hyperkalemia 02/28/2018   Hypertension    Hypothyroidism 10/10/2011   Iron deficiency anemia due to chronic blood loss    Squamous cell carcinoma of skin 06/15/2019   Right posterior ear. SCCis, hypertrophic, crusted   Thrombocytopenia ([place]) 10/11/2011   Type 2 diabetes with nephropathy [facility])    DM refresher course [place] (04/2013)    PAST SURGICAL HISTORY: Past Surgical History:  Procedure Laterality Date   BACK SURGERY     cervical neck   CATARACT EXTRACTION, BILATERAL Bilateral  2017   COLONOSCOPY  11/2008   1 polyp, diverticulosis, rec rpt 5 yrs (Dr. Oletta Lamas, [name])   COLONOSCOPY  06/2014   hyperplastic polyp, rpt 5 yrs Oletta Lamas)   COLONOSCOPY WITH PROPOFOL N/A 11/17/2017   TA, HP, (Vanga, Tally Due, MD)   ESOPHAGOGASTRODUODENOSCOPY (EGD) WITH PROPOFOL N/A 11/17/2017   healing erosive gastritis, intestinal metaplasia,  neg H pylori (Vanga, Tally Due, MD)   ESOPHAGOGASTRODUODENOSCOPY (EGD) WITH PROPOFOL N/A 06/28/2019   erosive gastropathy with stigmata of recent bleed, granular tissue in esophagus biopsied- reflux esophagitis, small HH (Vanga, Tally Due, MD)   EYE SURGERY  2012   laser surgery for glaucoma   PORTACATH PLACEMENT Left 12/09/2018   Procedure: INSERTION PORT-A-CATH;  Surgeon: [name], MD;  Location: [facility] ORS;  Service: General;  Laterality: Left;   PTCA  1994, 1995   US ECHOCARDIOGRAPHY  10/2013   inferior wall hypokinesis, mild LVH, EF 50-55%, mild MR and LA dilation    FAMILY HISTORY: Family History  Problem Relation Age of Onset   Diabetes Sister    Stomach cancer Sister 65   Pneumonia Father        caused death   Other Brother        no communication with brother so unsure of any health conditions   Coronary artery disease Son 94       5v CABG and stents   Hyperlipidemia Sister    Stroke Neg Hx    Heart attack Neg Hx     ADVANCED DIRECTIVES (Y/N):  N  HEALTH MAINTENANCE: Social History   Tobacco Use   Smoking status: Former    Types: Cigarettes    Quit date: 03/04/1978    Years since quitting: 43.7   Smokeless tobacco: Never  Vaping Use   Vaping Use: Never used  Substance Use Topics   Alcohol use: No   Drug use: No     Colonoscopy:  PAP:  Bone density:  Lipid panel:  Allergies  Allergen Reactions   Lisinopril Other (See Comments)    Hyperkalemia even at 58m dose    Current Outpatient Medications  Medication Sig Dispense Refill   acetaminophen (TYLENOL) 650 MG CR tablet Take 1,300 mg by mouth 2 (two) times daily.     amLODipine (NORVASC) 10 MG tablet Take 1 tablet (10 mg total) by mouth daily. 90 tablet 3   atorvastatin (LIPITOR) 80 MG tablet TAKE 1 TABLET BY MOUTH ON MONDAY, WEDNESDAY AND FRIDAY 39 tablet 3   carvedilol (COREG) 6.25 MG tablet TAKE 1 TABLET BY MOUTH TWICE DAILY WITH MEALS 180 tablet 3   ezetimibe (ZETIA) 10 MG tablet Take 1  tablet (10 mg total) by mouth daily. 90 tablet 3   hydrochlorothiazide (MICROZIDE) 12.5 MG capsule Take 1 capsule by mouth once daily 90 capsule 0   insulin detemir (LEVEMIR FLEXTOUCH) 100 UNIT/ML FlexPen Inject 12 Units into the skin daily. 15 mL 3   Insulin Pen Needle 32G X 4 MM MISC 1 Device by Does not apply route in the morning, at noon, in the evening, and at bedtime. 400 each 3   levothyroxine (SYNTHROID) 88 MCG tablet Take 1 tablet (88 mcg total) by mouth daily. 90 tablet 3   lipase/protease/amylase (CREON) 36000 UNITS CPEP capsule Take 2 capsules with the first bite of each meal and 1 capsule with the first bite of each snack 240 capsule 11   meclizine (ANTIVERT) 12.5 MG tablet TAKE 1 TABLET BY MOUTH TWICE DAILY AS NEEDED FOR  DIZZINESS  (  SEDATION  PRECAUTIONS) 20 tablet 0   nitroGLYCERIN (NITROSTAT) 0.4 MG SL tablet Place 1 tablet (0.4 mg total) under the tongue every 5 (five) minutes as needed for chest pain ((MAX of 3 doses)). 25 tablet 0   NOVOLOG FLEXPEN 100 UNIT/ML FlexPen Max daily 20 units per scale 15 mL 6   oxymetazoline (AFRIN) 0.05 % nasal spray Place 1 spray into left nostril daily as needed (nose bleeds).     traZODone (DESYREL) 100 MG tablet TAKE 1 TABLET BY MOUTH AT BEDTIME AS NEEDED FOR SLEEP 90 tablet 0   triamcinolone ointment (KENALOG) 0.5 % Apply 1 application topically 2 (two) times daily. (Patient not taking: Reported on 11/19/2021) 30 g 1   No current facility-administered medications for this visit.   Facility-Administered Medications Ordered in Other Visits  Medication Dose Route Frequency Provider Last Rate Last Admin   sodium chloride flush (NS) 0.9 % injection 10 mL  10 mL Intravenous Once Lloyd Huger, MD        OBJECTIVE: Vitals:   11/19/21 1041  BP: (!) 147/80  Pulse: (!) 54  Temp: (!) 97.1 F (36.2 C)     Body mass index is 19.53 kg/m.    ECOG FS:0 - Asymptomatic  General: Well-developed, well-nourished, no acute distress. Eyes: Pink  conjunctiva, anicteric sclera. HEENT: Normocephalic, moist mucous membranes. Lungs: No audible wheezing or coughing. Heart: Regular rate and rhythm. Abdomen: Soft, nontender, no obvious distention. Musculoskeletal: No edema, cyanosis, or clubbing. Neuro: Alert, answering all questions appropriately. Cranial nerves grossly intact. Skin: No rashes or petechiae noted. Psych: Normal affect.  LAB RESULTS:  Lab Results  Component Value Date   NA 140 06/19/2021   K 4.8 06/19/2021   CL 103 06/19/2021   CO2 30 06/19/2021   GLUCOSE 227 (H) 06/19/2021   BUN 43 (H) 06/19/2021   CREATININE 1.70 (H) 06/19/2021   CALCIUM 9.7 06/19/2021   PROT 6.9 04/28/2021   ALBUMIN 4.4 05/09/2021   AST 34 04/28/2021   ALT 42 04/28/2021   ALKPHOS 89 04/28/2021   BILITOT 0.7 04/28/2021   GFRNONAA 39 (L) 04/28/2021   GFRAA 42 (L) 10/28/2019    Lab Results  Component Value Date   WBC 4.3 11/19/2021   NEUTROABS 2.7 11/19/2021   HGB 12.8 (L) 11/19/2021   HCT 40.1 11/19/2021   MCV 94.1 11/19/2021   PLT 131 (L) 11/19/2021     STUDIES: No results found.  ONCOLOGY HISTORY: Bone marrow biopsy on Jul 28, 2018 reviewed independently with 73% involvement of CLL.  Previous bone marrow biopsy on October 23, 2017 revealed only 31% involvement.  Previously, peripheral blood FISH testing 50% incidence of mutation in the ATM gene which is commonly associated with deletion 11q. 11q- is associated with an unfavorable prognosis and high risk of not responding to initial treatment. 13q- is a more common mutation and is actually associated with a more favorable prognosis. Patient had clear progression of disease in his bone marrow along with a worsening transfusion requirement.  He received 5 weekly cycles of Rituxan with mild improvement of his platelet count.  Patient then received 4 cycles of Rituxan plus Treanda completing treatment on January 12, 2019.   ASSESSMENT: CLL with ATM (11q-) mutation and 13q-, anemia,  ITP. Encounter Diagnoses  Name Primary?   CLL (chronic lymphocytic leukemia) (HCC) Yes   Thrombocytopenia (HCC)    Anemia, unspecified type    Renal insufficiency     PLAN:    1. CLL: See oncology history  as above. Chronic and stable. He continues to remain in very good partial remission with improvement in labs this visit.  No treatment required at this time.  No intervention is needed.  Per Dr. [name], "If patient required retreatment, would consider Rituxan plus Treanda once again."  Return to clinic in 3 months for laboratory work only and then in 6 months for laboratory work and further evaluation.   2.  Thrombocytopenia: Chronic and slightly improved. Patient's platelet count is 131  today.  Previously, he received high-dose prednisone, IVIG, and Rituxan with no appreciable durability to improve his platelet count.  Platelets improved upon treatment of his CLL per Dr. [name].  3.  Anemia: Chronic and improved with an Hgb level of 12.8 today. Per Dr. Gary Fleet last note, His last blood transfusion was given on December 03, 2018.  Patient had colonoscopy on November 17, 2017 that removed 2 nonmalignant polyps.  EGD on the same day revealed nonbleeding erosive gastropathy with multiple nonbleeding duodenal ulcers.  Repeat EGD did not reveal significant pathology.  We will continue with the plan of GI follow up as scheduled. 4.  Reaction to Rituxan: Rate-based.  Patient will require premedications for any further infusions with Rituxan.  These have been entered into his treatment plan. 5.  Chronic renal insufficiency: Chronic in nature. Has blood work scheduled for next month. Hoping that better glycemic control will help with his creatinine values.  Continue monitoring and treatment per primary care and endocrinology.  6.  Hyperkalemia: Resolved previously.   Patient expressed understanding and was in agreement with this plan. He also understands that He can call clinic at any time with any  questions, concerns, or complaints.    Hughie Closs, PA-C   11/19/2021 1:15 PM

## 2021-11-19 NOTE — Telephone Encounter (Signed)
Called and gave creon the update license number order number is [id number]. Called and left a detail message

## 2021-12-10 ENCOUNTER — Ambulatory Visit (INDEPENDENT_AMBULATORY_CARE_PROVIDER_SITE_OTHER): Payer: Medicare Other | Admitting: Family Medicine

## 2021-12-10 ENCOUNTER — Encounter: Payer: Self-pay | Admitting: Family Medicine

## 2021-12-10 VITALS — BP 136/78 | HR 56 | Temp 97.2°F | Ht 71.0 in | Wt 149.0 lb

## 2021-12-10 DIAGNOSIS — G8929 Other chronic pain: Secondary | ICD-10-CM | POA: Diagnosis not present

## 2021-12-10 DIAGNOSIS — E1142 Type 2 diabetes mellitus with diabetic polyneuropathy: Secondary | ICD-10-CM | POA: Diagnosis not present

## 2021-12-10 DIAGNOSIS — K8681 Exocrine pancreatic insufficiency: Secondary | ICD-10-CM

## 2021-12-10 DIAGNOSIS — C9110 Chronic lymphocytic leukemia of B-cell type not having achieved remission: Secondary | ICD-10-CM

## 2021-12-10 DIAGNOSIS — E1122 Type 2 diabetes mellitus with diabetic chronic kidney disease: Secondary | ICD-10-CM | POA: Diagnosis not present

## 2021-12-10 DIAGNOSIS — M545 Low back pain, unspecified: Secondary | ICD-10-CM

## 2021-12-10 DIAGNOSIS — I2510 Atherosclerotic heart disease of native coronary artery without angina pectoris: Secondary | ICD-10-CM | POA: Diagnosis not present

## 2021-12-10 DIAGNOSIS — Z794 Long term (current) use of insulin: Secondary | ICD-10-CM | POA: Diagnosis not present

## 2021-12-10 DIAGNOSIS — Z23 Encounter for immunization: Secondary | ICD-10-CM

## 2021-12-10 DIAGNOSIS — I6523 Occlusion and stenosis of bilateral carotid arteries: Secondary | ICD-10-CM | POA: Diagnosis not present

## 2021-12-10 DIAGNOSIS — E1165 Type 2 diabetes mellitus with hyperglycemia: Secondary | ICD-10-CM

## 2021-12-10 DIAGNOSIS — N1832 Chronic kidney disease, stage 3b: Secondary | ICD-10-CM

## 2021-12-10 LAB — POCT GLYCOSYLATED HEMOGLOBIN (HGB A1C): Hemoglobin A1C: 7.8 % — AB (ref 4.0–5.6)

## 2021-12-10 MED ORDER — TRAMADOL HCL 50 MG PO TABS: 50.0000 mg | ORAL_TABLET | Freq: Three times a day (TID) | ORAL | 0 refills | Status: DC | PRN

## 2021-12-10 MED ORDER — FREESTYLE LIBRE 2 SENSOR MISC: 1.0000 | 3 refills | Status: DC

## 2021-12-10 NOTE — Assessment & Plan Note (Signed)
Reviewed CGM data - no low sugars, but he remains worried about low sugars.

## 2021-12-10 NOTE — Assessment & Plan Note (Signed)
Avoid oral prednisone.  He did well with sparing low dose tramadol (1/2 tab) use. Will refill.

## 2021-12-10 NOTE — Patient Instructions (Addendum)
Flu shot today  Continue current medicines.  Tramadol refilled - use sparingly.  Good to see you today.  Return in 6 months for physical.

## 2021-12-10 NOTE — Progress Notes (Signed)
Patient ID: Scott [name], male    DOB: [birth year], 81 y.o.   MRN: [id number]  This visit was conducted in person.  BP 136/78 (BP Location: Left Arm, Patient Position: Sitting, Cuff Size: Normal)   Pulse (!) 56   Temp (!) 97.2 F (36.2 C)   Ht '5\' 11"'$  (1.803 m)   Wt 149 lb (67.6 kg)   SpO2 99%   BMI 20.78 kg/m    CC: 4 mo DM f/u visit  Subjective:   HPI: Scott [name] is a 81 y.o. male presenting on 12/10/2021 for 4 Month DM Follow-up (He is wearing a Freestyle Libre 2 sensor. 127 right now. )   Known CAD with stable angina - followed by Dr Rockey Situ. Stress test earlier this year through cardiology without ischemia, done for exertional dyspnea.   EPI - followed by GI Dr Marius Ditch on lifelong Creon.  CLL with mild anemia - followed by oncology [name]).   Back pain - saw Dr Lorelei Pont, had acute hyperglycemic spike after oral steroid course. He is using tramadol instead for pain - this helped, he'd like a refill. Takes 1/2 at a time.   DM - followed by endo, does regularly check sugars - data from CGM reviewed: 30d in range 44%, 42% (181-240), 14% (>240), no hypoglycemia. Compliant with antihyperglycemic regimen which includes: mealtime novolog (5u with breakfast and 4u with supper) + SSI, levemir 12u daily. Denies low sugars or hypoglycemic symptoms. Denies paresthesias, blurry vision. Last diabetic eye exam 03/2021. Glucometer brand: freestyle libre 2. Last foot exam: 12/2020 - DUE. DSME: completed [facility].  Lab Results  Component Value Date   HGBA1C 7.8 (A) 12/10/2021   Diabetic Foot Exam - Simple   Simple Foot Form Diabetic Foot exam was performed with the following findings: Yes 12/10/2021 10:04 AM  Visual Inspection No deformities, no ulcerations, no other skin breakdown bilaterally: Yes Sensation Testing Intact to touch and monofilament testing bilaterally: Yes Pulse Check Posterior Tibialis and Dorsalis pulse intact bilaterally: Yes Comments Dry skin to soles, thickened  nails    Lab Results  Component Value Date   MICROALBUR 29.0 (H) 02/22/2019         Relevant past medical, surgical, family and social history reviewed and updated as indicated. Interim medical history since our last visit reviewed. Allergies and medications reviewed and updated. Outpatient Medications Prior to Visit  Medication Sig Dispense Refill   acetaminophen (TYLENOL) 650 MG CR tablet Take 1,300 mg by mouth 2 (two) times daily.     amLODipine (NORVASC) 10 MG tablet Take 1 tablet (10 mg total) by mouth daily. 90 tablet 3   atorvastatin (LIPITOR) 80 MG tablet TAKE 1 TABLET BY MOUTH ON MONDAY, WEDNESDAY AND FRIDAY 39 tablet 3   carvedilol (COREG) 6.25 MG tablet TAKE 1 TABLET BY MOUTH TWICE DAILY WITH MEALS 180 tablet 3   ezetimibe (ZETIA) 10 MG tablet Take 1 tablet (10 mg total) by mouth daily. 90 tablet 3   hydrochlorothiazide (MICROZIDE) 12.5 MG capsule Take 1 capsule by mouth once daily 90 capsule 0   insulin detemir (LEVEMIR FLEXTOUCH) 100 UNIT/ML FlexPen Inject 12 Units into the skin daily. 15 mL 3   Insulin Pen Needle 32G X 4 MM MISC 1 Device by Does not apply route in the morning, at noon, in the evening, and at bedtime. 400 each 3   levothyroxine (SYNTHROID) 88 MCG tablet Take 1 tablet (88 mcg total) by mouth daily. 90 tablet 3   lipase/protease/amylase (CREON)  36000 UNITS CPEP capsule Take 2 capsules with the first bite of each meal and 1 capsule with the first bite of each snack 240 capsule 11   meclizine (ANTIVERT) 12.5 MG tablet TAKE 1 TABLET BY MOUTH TWICE DAILY AS NEEDED FOR  DIZZINESS  (SEDATION  PRECAUTIONS) 20 tablet 0   nitroGLYCERIN (NITROSTAT) 0.4 MG SL tablet Place 1 tablet (0.4 mg total) under the tongue every 5 (five) minutes as needed for chest pain ((MAX of 3 doses)). 25 tablet 0   NOVOLOG FLEXPEN 100 UNIT/ML FlexPen Max daily 20 units per scale 15 mL 6   oxymetazoline (AFRIN) 0.05 % nasal spray Place 1 spray into left nostril daily as needed (nose bleeds).      traZODone (DESYREL) 100 MG tablet TAKE 1 TABLET BY MOUTH AT BEDTIME AS NEEDED FOR SLEEP 90 tablet 0   triamcinolone ointment (KENALOG) 0.5 % Apply 1 application topically 2 (two) times daily. 30 g 1   Continuous Blood Gluc Sensor (FREESTYLE LIBRE 2 SENSOR) MISC 1 each by Does not apply route every 14 (fourteen) days.     Facility-Administered Medications Prior to Visit  Medication Dose Route Frequency Provider Last Rate Last Admin   sodium chloride flush (NS) 0.9 % injection 10 mL  10 mL Intravenous Once [name], Kathlene November, MD         Per HPI unless specifically indicated in ROS section below Review of Systems  Objective:  BP 136/78 (BP Location: Left Arm, Patient Position: Sitting, Cuff Size: Normal)   Pulse (!) 56   Temp (!) 97.2 F (36.2 C)   Ht '5\' 11"'$  (1.803 m)   Wt 149 lb (67.6 kg)   SpO2 99%   BMI 20.78 kg/m   Wt Readings from Last 3 Encounters:  12/10/21 149 lb (67.6 kg)  11/19/21 140 lb (63.5 kg)  11/08/21 151 lb (68.5 kg)      Physical Exam Vitals and nursing note reviewed.  Constitutional:      Appearance: Normal appearance. He is not ill-appearing.  Eyes:     Extraocular Movements: Extraocular movements intact.     Conjunctiva/sclera: Conjunctivae normal.     Pupils: Pupils are equal, round, and reactive to light.  Cardiovascular:     Rate and Rhythm: Normal rate and regular rhythm.     Pulses: Normal pulses.     Heart sounds: Normal heart sounds. No murmur heard. Pulmonary:     Effort: Pulmonary effort is normal. No respiratory distress.     Breath sounds: Normal breath sounds. No wheezing, rhonchi or rales.  Musculoskeletal:     Right lower leg: No edema.     Left lower leg: No edema.     Comments: See HPI for foot exam if done  Skin:    General: Skin is warm and dry.     Findings: No rash.  Neurological:     Mental Status: He is alert.  Psychiatric:        Mood and Affect: Mood normal.        Behavior: Behavior normal.       Results for orders  placed or performed in visit on 12/10/21  HgB A1c  Result Value Ref Range   Hemoglobin A1C 7.8 (A) 4.0 - 5.6 %   HbA1c POC (<> result, manual entry)     HbA1c, POC (prediabetic range)     HbA1c, POC (controlled diabetic range)      Assessment & Plan:   Problem List Items Addressed This Visit  CAD (coronary artery disease)    Stable period, appreciate cardiology care.       Type 2 diabetes mellitus with diabetic chronic kidney disease (HCC) - Primary    Chronic, stable period. Appreciate endo care. Continue insulin regimen. No hypoglycemic recent symptoms       CLL (chronic lymphocytic leukemia) ([place])    Continue oncology care.       Relevant Medications   traMADol (ULTRAM) 50 MG tablet   Chronic midline low back pain without sciatica    Avoid oral prednisone.  He did well with sparing low dose tramadol (1/2 tab) use. Will refill.       Relevant Medications   traMADol (ULTRAM) 50 MG tablet   Exocrine pancreatic insufficiency    Chronic, continue creon followed by GI.       Type 2 diabetes mellitus with diabetic polyneuropathy, with long-term current use of insulin (HCC)   Relevant Orders   HgB A1c (Completed)   Type 2 diabetes mellitus with hyperglycemia, with long-term current use of insulin (HCC)    Reviewed CGM data - no low sugars, but he remains worried about low sugars.       Other Visit Diagnoses     Need for influenza vaccination       Relevant Orders   Flu Vaccine QUAD High Dose(Fluad) (Completed)        Meds ordered this encounter  Medications   traMADol (ULTRAM) 50 MG tablet    Sig: Take 1 tablet (50 mg total) by mouth every 8 (eight) hours as needed.    Dispense:  20 tablet    Refill:  0   Continuous Blood Gluc Sensor (FREESTYLE LIBRE 2 SENSOR) MISC    Sig: 1 each by Does not apply route every 14 (fourteen) days.    Dispense:  6 each    Refill:  3   Orders Placed This Encounter  Procedures   Flu Vaccine QUAD High Dose(Fluad)   HgB A1c      Patient Instructions  Flu shot today  Continue current medicines.  Tramadol refilled - use sparingly.  Good to see you today.  Return in 6 months for physical.   Follow up plan: Return in about 6 months (around 06/11/2022) for annual exam, prior fasting for blood work.  [name], MD

## 2021-12-10 NOTE — Assessment & Plan Note (Signed)
Chronic, continue creon followed by GI.

## 2021-12-10 NOTE — Assessment & Plan Note (Signed)
Chronic, stable period. Appreciate endo care. Continue insulin regimen. No hypoglycemic recent symptoms

## 2021-12-10 NOTE — Assessment & Plan Note (Signed)
Stable period, appreciate cardiology care.

## 2021-12-10 NOTE — Assessment & Plan Note (Signed)
Continue oncology care.

## 2022-01-03 ENCOUNTER — Telehealth: Payer: Self-pay

## 2022-01-03 NOTE — Telephone Encounter (Signed)
Patient brought in re enrollment patient assistance form for his creon. He filled out his portion and I filled out our portion of the form. Fax it to the [place] patient assistance

## 2022-01-09 ENCOUNTER — Telehealth: Payer: Self-pay | Admitting: Gastroenterology

## 2022-01-09 NOTE — Telephone Encounter (Signed)
Pt came to office needing confirmation that his medication info had been faxed in he states that he call to check on the status and there wasn't a record of it  creon

## 2022-01-10 NOTE — Telephone Encounter (Signed)
Called and Belenda Cruise states that they are getting so many applications at this time and it taking longer getting everything in the system. They state we can refax the application to make sure they received the application. Patient ID number is [id number] for the patient assistance Called informed patient this information and he verbalized understanding

## 2022-01-14 NOTE — Telephone Encounter (Signed)
Called to check on the application and they state they have not received the patient portion of the application. Refaxed application to them

## 2022-01-23 NOTE — Telephone Encounter (Signed)
They have all the information they are processing the application

## 2022-01-29 ENCOUNTER — Telehealth: Payer: Self-pay

## 2022-01-29 MED ORDER — LANTUS SOLOSTAR 100 UNIT/ML ~~LOC~~ SOPN: 12.0000 [IU] | PEN_INJECTOR | Freq: Every day | SUBCUTANEOUS | 3 refills | Status: DC

## 2022-01-29 NOTE — Telephone Encounter (Signed)
Patient is out of his Levemir and states that he received letter that they will not cover this medication anymore. He can't find the letter so he is not sure what they will cover but needs something sent in.

## 2022-01-29 NOTE — Telephone Encounter (Signed)
Patient advise and will contact pharmacy

## 2022-01-30 NOTE — Telephone Encounter (Signed)
Patient has been approved for creon till 03/04/2023

## 2022-01-31 ENCOUNTER — Other Ambulatory Visit: Payer: Self-pay | Admitting: Family Medicine

## 2022-01-31 DIAGNOSIS — F5104 Psychophysiologic insomnia: Secondary | ICD-10-CM

## 2022-02-15 ENCOUNTER — Encounter: Payer: Self-pay | Admitting: Internal Medicine

## 2022-02-15 ENCOUNTER — Ambulatory Visit (INDEPENDENT_AMBULATORY_CARE_PROVIDER_SITE_OTHER): Payer: Medicare Other | Admitting: Internal Medicine

## 2022-02-15 VITALS — BP 130/78 | HR 68 | Ht 71.0 in | Wt 150.8 lb

## 2022-02-15 DIAGNOSIS — E1142 Type 2 diabetes mellitus with diabetic polyneuropathy: Secondary | ICD-10-CM | POA: Diagnosis not present

## 2022-02-15 DIAGNOSIS — E1165 Type 2 diabetes mellitus with hyperglycemia: Secondary | ICD-10-CM

## 2022-02-15 DIAGNOSIS — N1832 Chronic kidney disease, stage 3b: Secondary | ICD-10-CM | POA: Diagnosis not present

## 2022-02-15 DIAGNOSIS — E1122 Type 2 diabetes mellitus with diabetic chronic kidney disease: Secondary | ICD-10-CM | POA: Diagnosis not present

## 2022-02-15 DIAGNOSIS — Z794 Long term (current) use of insulin: Secondary | ICD-10-CM

## 2022-02-15 LAB — POCT GLYCOSYLATED HEMOGLOBIN (HGB A1C): Hemoglobin A1C: 7.7 % — AB (ref 4.0–5.6)

## 2022-02-15 NOTE — Patient Instructions (Addendum)
Take Lantus 12 units daily  Novolog 5 units with Breakfast and 4 units with Supper  Novolog correctional insulin: ADD extra units on insulin to your meal-time Novolog dose if your blood sugars are higher than 200. Use the scale below to help guide you:   Blood sugar before meal Number of units to inject  Less than 200 0 unit  201 -  260 1 units  261 -  320 2 units  321 -  380 3 units  381 -  440 4 units   HOW TO TREAT LOW BLOOD SUGARS (Blood sugar LESS THAN 70 MG/DL) Please follow the RULE OF 15 for the treatment of hypoglycemia treatment (when your (blood sugars are less than 70 mg/dL)   STEP 1: Take 15 grams of carbohydrates when your blood sugar is low, which includes:  3-4 GLUCOSE TABS  OR 3-4 OZ OF JUICE OR REGULAR SODA OR ONE TUBE OF GLUCOSE GEL    STEP 2: RECHECK blood sugar in 15 MINUTES STEP 3: If your blood sugar is still low at the 15 minute recheck --> then, go back to STEP 1 and treat AGAIN with another 15 grams of carbohydrates.

## 2022-02-15 NOTE — Progress Notes (Signed)
Name: Scott [name]  MRN/ DOB: [id number], 1941-01-24   Age/ Sex: 81 y.o., male    PCP: [name], MD   Reason for Endocrinology Evaluation: Type 2 Diabetes Mellitus     Date of Initial Endocrinology Visit: 09/27/2021    PATIENT IDENTIFIER: Scott [name] is a 81 y.o. male with a past medical history of T2DM, CLL and hypothyroidism. The patient presented for initial endocrinology clinic visit on 09/27/2021 for consultative assistance with his diabetes management.    HPI: Scott [name] was    Diagnosed with DM 1990 Prior Medications tried/Intolerance: metformin , glipizide  Hemoglobin A1c has ranged from 6.9% in 2019, peaking at 8.6% in 2023.    Patient with pancreatic insufficiency and is on Creon Patient follows with cardiology for CAD He also follows with oncology for leukemia  On his initial visit to our clinic he had an A1c of 7.8%, he was already on MDI regimen which we adjusted, he was provided with a correction scale  SUBJECTIVE:   During the last visit (09/27/2021): A1c 7.8%   Today (02/15/22): Scott [name] is here for a follow up on diabetes management. He checks his  blood sugars multiple times daily. The patient has not had hypoglycemic episodes since the last clinic visit.   He was seen by GI 10/02/2021 for exocrine pancreatic insufficiency Had a follow-up with oncology for CLL 11/19/2021  Denies nausea, vomiting or diarrhea    HOME DIABETES REGIMEN: Levemir 12 units daily  NovoLog 5 units with breakfast, 4 units with Supper  Correction factor: NovoLog (BG -140/60)    CONTINUOUS GLUCOSE MONITORING RECORD INTERPRETATION    Dates of Recording: 12/2-12/15/2023  Sensor description:freestyle libre  Results statistics:   CGM use % of time 96  Average and SD 183/24.1  Time in range   53 %  % Time Above 180 40  % Time above 250 7  % Time Below target 0    Glycemic patterns summary:Bg's are stable overnight with hyperglycemia noted during the day    Hyperglycemic episodes  post-prandial  Hypoglycemic episodes occurred n/a  Overnight : BG's are optimal      DIABETIC COMPLICATIONS: Microvascular complications:  CKD III, neuropathy Denies: retinopathy Last eye exam: Completed 09/2021  Macrovascular complications:  CAD Denies: PVD, CVA   PAST HISTORY: Past Medical History:  Past Medical History:  Diagnosis Date   Acquired hand deformity 1962   hand saw accident at work   Anemia 10/11/2011   Arthritis    Bradycardia 10/10/2011   CAD (coronary artery disease) 10/10/2011   MI s/p PTCA (Dx-OM2 proximal concentric stenosis)   Carotid artery disease (HCC)    Cellulitis of buttock, left 11/17/2018   Chronic edema    CKD (chronic kidney disease) stage 3, GFR 30-59 ml/min (HCC)    [name]   CLL (chronic lymphocytic leukemia) ([place])    Colon polyps    Cough 04/28/2019   Dysphagia    EGD - reflux esophagitis, one area of stomach with focal intestinal metaplasia (06/2019) Dr Marius Ditch   Generalized headaches    frequent   GI bleed 07/08/2017   Glaucoma    s/p laser surgery   HLD (hyperlipidemia)    Hyperkalemia 02/28/2018   Hypertension    Hypothyroidism 10/10/2011   Iron deficiency anemia due to chronic blood loss    Squamous cell carcinoma of skin 06/15/2019   Right posterior ear. SCCis, hypertrophic, crusted   Thrombocytopenia ([place]) 10/11/2011   Type 2 diabetes with nephropathy ([place])  DM refresher course [facility] (04/2013)   Past Surgical History:  Past Surgical History:  Procedure Laterality Date   BACK SURGERY     cervical neck   CATARACT EXTRACTION, BILATERAL Bilateral 2017   COLONOSCOPY  11/2008   1 polyp, diverticulosis, rec rpt 5 yrs (Dr. Oletta Lamas, [name])   COLONOSCOPY  06/2014   hyperplastic polyp, rpt 5 yrs Oletta Lamas)   COLONOSCOPY WITH PROPOFOL N/A 11/17/2017   TA, HP, (Vanga, Tally Due, MD)   ESOPHAGOGASTRODUODENOSCOPY (EGD) WITH PROPOFOL N/A 11/17/2017   healing erosive gastritis, intestinal metaplasia, neg H  pylori (Vanga, Tally Due, MD)   ESOPHAGOGASTRODUODENOSCOPY (EGD) WITH PROPOFOL N/A 06/28/2019   erosive gastropathy with stigmata of recent bleed, granular tissue in esophagus biopsied- reflux esophagitis, small HH (Vanga, Tally Due, MD)   EYE SURGERY  2012   laser surgery for glaucoma   PORTACATH PLACEMENT Left 12/09/2018   Procedure: INSERTION PORT-A-CATH;  Surgeon: [name], MD;  Location: [facility] ORS;  Service: General;  Laterality: Left;   PTCA  1994, 1995   US ECHOCARDIOGRAPHY  10/2013   inferior wall hypokinesis, mild LVH, EF 50-55%, mild MR and LA dilation    Social History:  reports that he quit smoking about 43 years ago. His smoking use included cigarettes. He has never used smokeless tobacco. He reports that he does not drink alcohol and does not use drugs. Family History:  Family History  Problem Relation Age of Onset   Diabetes Sister    Stomach cancer Sister 64   Pneumonia Father        caused death   Other Brother        no communication with brother so unsure of any health conditions   Coronary artery disease Son 59       5v CABG and stents   Hyperlipidemia Sister    Stroke Neg Hx    Heart attack Neg Hx      HOME MEDICATIONS: Allergies as of 02/15/2022       Reactions   Lisinopril Other (See Comments)   Hyperkalemia even at '5mg'$  dose   Prednisone Other (See Comments)   Marked symptomatic hyperglycemia        Medication List        Accurate as of February 15, 2022 11:59 AM. If you have any questions, ask your nurse or doctor.          acetaminophen 650 MG CR tablet Commonly known as: TYLENOL Take 1,300 mg by mouth 2 (two) times daily.   amLODipine 10 MG tablet Commonly known as: NORVASC Take 1 tablet (10 mg total) by mouth daily.   atorvastatin 80 MG tablet Commonly known as: LIPITOR TAKE 1 TABLET BY MOUTH ON MONDAY, WEDNESDAY AND FRIDAY   carvedilol 6.25 MG tablet Commonly known as: COREG TAKE 1 TABLET BY MOUTH TWICE DAILY WITH  MEALS   ezetimibe 10 MG tablet Commonly known as: ZETIA Take 1 tablet (10 mg total) by mouth daily.   FreeStyle Libre 2 Sensor Misc 1 each by Does not apply route every 14 (fourteen) days.   hydrochlorothiazide 12.5 MG capsule Commonly known as: MICROZIDE Take 1 capsule by mouth once daily   Insulin Pen Needle 32G X 4 MM Misc 1 Device by Does not apply route in the morning, at noon, in the evening, and at bedtime.   Lantus SoloStar 100 UNIT/ML Solostar Pen Generic drug: insulin glargine Inject 12 Units into the skin daily.   levothyroxine 88 MCG tablet Commonly known as: SYNTHROID Take  1 tablet (88 mcg total) by mouth daily.   lipase/protease/amylase 36000 UNITS Cpep capsule Commonly known as: Creon Take 2 capsules with the first bite of each meal and 1 capsule with the first bite of each snack   meclizine 12.5 MG tablet Commonly known as: ANTIVERT TAKE 1 TABLET BY MOUTH TWICE DAILY AS NEEDED FOR  DIZZINESS  (SEDATION  PRECAUTIONS)   nitroGLYCERIN 0.4 MG SL tablet Commonly known as: NITROSTAT Place 1 tablet (0.4 mg total) under the tongue every 5 (five) minutes as needed for chest pain ((MAX of 3 doses)).   NovoLOG FlexPen 100 UNIT/ML FlexPen Generic drug: insulin aspart Max daily 20 units per scale   oxymetazoline 0.05 % nasal spray Commonly known as: AFRIN Place 1 spray into left nostril daily as needed (nose bleeds).   traMADol 50 MG tablet Commonly known as: ULTRAM Take 1 tablet (50 mg total) by mouth every 8 (eight) hours as needed.   traZODone 100 MG tablet Commonly known as: DESYREL TAKE 1 TABLET BY MOUTH AT BEDTIME AS NEEDED FOR SLEEP   triamcinolone ointment 0.5 % Commonly known as: KENALOG Apply 1 application topically 2 (two) times daily.         ALLERGIES: Allergies  Allergen Reactions   Lisinopril Other (See Comments)    Hyperkalemia even at '5mg'$  dose   Prednisone Other (See Comments)    Marked symptomatic hyperglycemia     REVIEW OF  SYSTEMS: A comprehensive ROS was conducted with the patient and is negative except as per HPI     OBJECTIVE:   VITAL SIGNS: BP 130/78 (BP Location: Left Arm, Patient Position: Sitting, Cuff Size: Normal)   Pulse 68   Ht '5\' 11"'$  (1.803 m)   Wt 150 lb 12.8 oz (68.4 kg)   SpO2 95%   BMI 21.03 kg/m    PHYSICAL EXAM:  General: Pt appears well and is in NAD  Neck: General: Supple without adenopathy or carotid bruits. Thyroid: Thyroid size normal.  No goiter or nodules appreciated.   Lungs: Clear with good BS bilat with no rales, rhonchi, or wheezes  Heart: RRR  Extremities:  Lower extremities - No pretibial edema. No lesions.  Neuro: MS is good with appropriate affect, pt is alert and Ox3    DM foot exam: 09/27/2021  The skin of the feet is intact without sores or ulcerations. The pedal pulses are 2+ on right and 2+ on left. The sensation is decreased to a screening 5.07, 10 gram monofilament bilaterally at the heels    DATA REVIEWED:  Lab Results  Component Value Date   HGBA1C 7.8 (A) 12/10/2021   HGBA1C 7.8 (A) 08/10/2021   HGBA1C 8.6 (H) 05/09/2021   Lab Results  Component Value Date   MICROALBUR 29.0 (H) 02/22/2019   LDLCALC 99 04/12/2020   CREATININE 1.70 (H) 06/19/2021   Lab Results  Component Value Date   MICRALBCREAT 21.6 02/22/2019    Lab Results  Component Value Date   CHOL 131 05/09/2021   HDL 44.60 05/09/2021   LDLCALC 99 04/12/2020   LDLDIRECT 69.0 05/09/2021   TRIG 255.0 (H) 05/09/2021   CHOLHDL 3 05/09/2021        ASSESSMENT / PLAN / RECOMMENDATIONS:   1) Type 2 Diabetes Mellitus, Sub-Optimally controlled, With CKD III , neuropathic and macrovascular complications - Most recent A1c of 7.7 %. Goal A1c < 7.5 %.    -Diabetes is felt to be pancreatic in origin -His A1c has been trending down, but the patient  has severe fear of hypoglycemia, he will not go to bed without a BG of 200 mg/DL, this makes it difficult to optimize his glycemic control -I  do believe this is the best that we can get as far as glycemic control -No changes will be made to his insulin regimen, but he was advised to continue to use NovoLog before his meal and use correction scale if needed for hyperglycemia  MEDICATIONS: Continue Lantus 12 units daily Continue NovoLog 5 units with breakfast and 4 units with supper Continue correction factor: NovoLog (BG -140/60)  EDUCATION / INSTRUCTIONS: BG monitoring instructions: Patient is instructed to check his blood sugars 3 times a day, before meals. Call [facility] if: BG persistently < 70  I reviewed the Rule of 15 for the treatment of hypoglycemia in detail with the patient. Literature supplied.   2) Diabetic complications:  Eye: Does not have known diabetic retinopathy.  Neuro/ Feet: Does  have known diabetic peripheral neuropathy. Renal: Patient does  have known baseline CKD. He is not on an ACEI/ARB at present.patient is intolerant to lisinopril  Follow-up in 6 months   Signed electronically by: Mack Guise, MD  [facility]  [facility] [address] [place], [place] [address] Phone: [phone] FAX: [phone]   CC: [name], MD [place] [address] Phone: [phone]  Fax: [phone]    Return to Endocrinology clinic as below: Future Appointments  Date Time Provider Mobile  02/18/2022 11:30 AM [facility] None  03/27/2022 11:00 AM [facility] PHARMACIST [facility]  05/21/2022 10:00 AM [facility] None  05/21/2022 10:30 AM Lloyd Huger, MD [facility] None  06/04/2022  8:00 AM [facility]  06/11/2022 10:30 AM [name], MD [facility]

## 2022-02-18 ENCOUNTER — Inpatient Hospital Stay: Payer: Medicare Other | Attending: Internal Medicine

## 2022-02-18 DIAGNOSIS — C9110 Chronic lymphocytic leukemia of B-cell type not having achieved remission: Secondary | ICD-10-CM | POA: Insufficient documentation

## 2022-02-18 DIAGNOSIS — D693 Immune thrombocytopenic purpura: Secondary | ICD-10-CM | POA: Diagnosis not present

## 2022-02-18 LAB — CBC WITH DIFFERENTIAL/PLATELET
Abs Immature Granulocytes: 0.01 10*3/uL (ref 0.00–0.07)
Basophils Absolute: 0 10*3/uL (ref 0.0–0.1)
Basophils Relative: 1 %
Eosinophils Absolute: 0.1 10*3/uL (ref 0.0–0.5)
Eosinophils Relative: 2 %
HCT: 34.5 % — ABNORMAL LOW (ref 39.0–52.0)
Hemoglobin: 11.4 g/dL — ABNORMAL LOW (ref 13.0–17.0)
Immature Granulocytes: 0 %
Lymphocytes Relative: 20 %
Lymphs Abs: 0.8 10*3/uL (ref 0.7–4.0)
MCH: 30.8 pg (ref 26.0–34.0)
MCHC: 33 g/dL (ref 30.0–36.0)
MCV: 93.2 fL (ref 80.0–100.0)
Monocytes Absolute: 0.4 10*3/uL (ref 0.1–1.0)
Monocytes Relative: 9 %
Neutro Abs: 2.8 10*3/uL (ref 1.7–7.7)
Neutrophils Relative %: 68 %
Platelets: 124 10*3/uL — ABNORMAL LOW (ref 150–400)
RBC: 3.7 MIL/uL — ABNORMAL LOW (ref 4.22–5.81)
RDW: 12.5 % (ref 11.5–15.5)
WBC: 4.1 10*3/uL (ref 4.0–10.5)
nRBC: 0 % (ref 0.0–0.2)

## 2022-02-22 ENCOUNTER — Ambulatory Visit (INDEPENDENT_AMBULATORY_CARE_PROVIDER_SITE_OTHER)
Admission: RE | Admit: 2022-02-22 | Discharge: 2022-02-22 | Disposition: A | Discharge status: Home / Self Care | Payer: Medicare Other | Source: Ambulatory Visit | Attending: Primary Care | Admitting: Primary Care

## 2022-02-22 ENCOUNTER — Encounter: Payer: Self-pay | Admitting: Primary Care

## 2022-02-22 ENCOUNTER — Ambulatory Visit (INDEPENDENT_AMBULATORY_CARE_PROVIDER_SITE_OTHER): Payer: Medicare Other | Admitting: Primary Care

## 2022-02-22 VITALS — BP 110/64 | HR 50 | Temp 97.3°F | Ht 71.0 in | Wt 151.0 lb

## 2022-02-22 DIAGNOSIS — M545 Low back pain, unspecified: Secondary | ICD-10-CM

## 2022-02-22 DIAGNOSIS — G8929 Other chronic pain: Secondary | ICD-10-CM

## 2022-02-22 DIAGNOSIS — I6523 Occlusion and stenosis of bilateral carotid arteries: Secondary | ICD-10-CM | POA: Diagnosis not present

## 2022-02-22 DIAGNOSIS — M5136 Other intervertebral disc degeneration, lumbar region: Secondary | ICD-10-CM | POA: Diagnosis not present

## 2022-02-22 DIAGNOSIS — M4316 Spondylolisthesis, lumbar region: Secondary | ICD-10-CM | POA: Diagnosis not present

## 2022-02-22 NOTE — Assessment & Plan Note (Addendum)
More left sided today. No alarm signs on exam. Fortunately, today he's feeling the best he's felt over the last few months.  Suspect arthritis. Checking plain films today.  Encouraged activity as tolerated,, stretching. Continue Tylenol 1300 mg BID.  Continue Tramadol 25*-50 mg PRN, discussed that he can increase to 50 mg if needed.  Consider PT.

## 2022-02-22 NOTE — Progress Notes (Signed)
Subjective:    Patient ID: Scott [name], male    DOB: 1940/06/12, 81 y.o.   MRN: [id number]  Back Pain Pertinent negatives include no dysuria, numbness or weakness.    Scott [name] is a very pleasant 81 y.o. male patient of Dr. Danise Mina with a history of CAD, hypertension, hyperlipidemia, type 2 diabetes, cervical spondylosis, CLL, CKD, chronic midline lower back pain without sciatica, sternal fracture who presents today to discuss back pain.  His pain is located to the left lower back with occasional pain to right side which began in early September 2023 after picking up a generator. Since then his pain has been intermittent, waxes and wanes. His pain is worse when rising from a seated position or when sitting for prolonged periods of time. This morning his pain is improved.   He denies radiation of pain to his lower extremities, he denies numbness to lower extremities, loss of bowel or bladder control. He's been taking 25 mg of Tramadol on occasion with some improvement. He will Tylenol 1300 mg BID regularly.   Evaluated by sports medicine in September 2023 for bilateral lower back pain without radicular symptoms.  He was treated conservatively with prednisone course for suspected lumbosacral spine disease with acute exacerbation.  He has never undergone an xray. He is active during the day, does a lot of yard work, but hasn't done much over the last month.   Review of Systems  Genitourinary:  Negative for difficulty urinating, dysuria and frequency.       Denies loss of bowel or bladder control   Musculoskeletal:  Positive for back pain.  Neurological:  Negative for weakness and numbness.         Past Medical History:  Diagnosis Date   Acquired hand deformity 1962   hand saw accident at work   Anemia 10/11/2011   Arthritis    Bradycardia 10/10/2011   CAD (coronary artery disease) 10/10/2011   MI s/p PTCA (Dx-OM2 proximal concentric stenosis)   Carotid artery disease (HCC)     Cellulitis of buttock, left 11/17/2018   Chronic edema    CKD (chronic kidney disease) stage 3, GFR 30-59 ml/min (HCC)    [name]   CLL (chronic lymphocytic leukemia) ([place])    Colon polyps    Cough 04/28/2019   Dysphagia    EGD - reflux esophagitis, one area of stomach with focal intestinal metaplasia (06/2019) Dr Marius Ditch   Generalized headaches    frequent   GI bleed 07/08/2017   Glaucoma    s/p laser surgery   HLD (hyperlipidemia)    Hyperkalemia 02/28/2018   Hypertension    Hypothyroidism 10/10/2011   Iron deficiency anemia due to chronic blood loss    Squamous cell carcinoma of skin 06/15/2019   Right posterior ear. SCCis, hypertrophic, crusted   Thrombocytopenia ([place]) 10/11/2011   Type 2 diabetes with nephropathy [place])    DM refresher course [facility] (04/2013)    Social History   Socioeconomic History   Marital status: Widowed    Spouse name: Not on file   Number of children: Not on file   Years of education: Not on file   Highest education level: Not on file  Occupational History   Not on file  Tobacco Use   Smoking status: Former    Types: Cigarettes    Quit date: 03/04/1978    Years since quitting: 44.0   Smokeless tobacco: Never  Vaping Use   Vaping Use: Never used  Substance and  Sexual Activity   Alcohol use: No   Drug use: No   Sexual activity: Never  Other Topics Concern   Not on file  Social History Narrative   Widower. Wife passed 08/2015 from cancer. 1 dog   Occupation: retired, was route Hotel manager   Edu: 11th gr   Activity: walks dog (pomeranian) 3-4x/day, yardwork, rides bicycle.   Diet: drinks diet coke, no vegetables   Social Determinants of Health   Financial Resource Strain: Low Risk  (06/29/2021)   Overall Financial Resource Strain (CARDIA)    Difficulty of Paying Living Expenses: Not very hard  Food Insecurity: No Food Insecurity (06/29/2021)   Hunger Vital Sign    Worried About Running Out of Food in the Last Year: Never true    Ran Out of Food in  the Last Year: Never true  Transportation Needs: No Transportation Needs (04/11/2020)   PRAPARE - Hydrologist (Medical): No    Lack of Transportation (Non-Medical): No  Physical Activity: Inactive (04/11/2020)   Exercise Vital Sign    Days of Exercise per Week: 0 days    Minutes of Exercise per Session: 0 min  Stress: No Stress Concern Present (04/11/2020)   [place]    Feeling of Stress : Not at all  Social Connections: Not on file  Intimate Partner Violence: Not At Risk (04/11/2020)   Humiliation, Afraid, Rape, and Kick questionnaire    Fear of Current or Ex-Partner: No    Emotionally Abused: No    Physically Abused: No    Sexually Abused: No    Past Surgical History:  Procedure Laterality Date   BACK SURGERY     cervical neck   CATARACT EXTRACTION, BILATERAL Bilateral 2017   COLONOSCOPY  11/2008   1 polyp, diverticulosis, rec rpt 5 yrs (Dr. Oletta Lamas, [name])   COLONOSCOPY  06/2014   hyperplastic polyp, rpt 5 yrs Oletta Lamas)   COLONOSCOPY WITH PROPOFOL N/A 11/17/2017   TA, HP, (Vanga, Tally Due, MD)   ESOPHAGOGASTRODUODENOSCOPY (EGD) WITH PROPOFOL N/A 11/17/2017   healing erosive gastritis, intestinal metaplasia, neg H pylori (Vanga, Tally Due, MD)   ESOPHAGOGASTRODUODENOSCOPY (EGD) WITH PROPOFOL N/A 06/28/2019   erosive gastropathy with stigmata of recent bleed, granular tissue in esophagus biopsied- reflux esophagitis, small HH (Vanga, Tally Due, MD)   EYE SURGERY  2012   laser surgery for glaucoma   PORTACATH PLACEMENT Left 12/09/2018   Procedure: INSERTION PORT-A-CATH;  Surgeon: [name], MD;  Location: [facility] ORS;  Service: General;  Laterality: Left;   PTCA  1994, 1995   US ECHOCARDIOGRAPHY  10/2013   inferior wall hypokinesis, mild LVH, EF 50-55%, mild MR and LA dilation    Family History  Problem Relation Age of Onset   Diabetes Sister    Stomach cancer Sister 40    Pneumonia Father        caused death   Other Brother        no communication with brother so unsure of any health conditions   Coronary artery disease Son 12       5v CABG and stents   Hyperlipidemia Sister    Stroke Neg Hx    Heart attack Neg Hx     Allergies  Allergen Reactions   Lisinopril Other (See Comments)    Hyperkalemia even at '5mg'$  dose   Prednisone Other (See Comments)    Marked symptomatic hyperglycemia    Current Outpatient Medications on File  Prior to Visit  Medication Sig Dispense Refill   acetaminophen (TYLENOL) 650 MG CR tablet Take 1,300 mg by mouth 2 (two) times daily.     amLODipine (NORVASC) 10 MG tablet Take 1 tablet (10 mg total) by mouth daily. 90 tablet 3   atorvastatin (LIPITOR) 80 MG tablet TAKE 1 TABLET BY MOUTH ON MONDAY, WEDNESDAY AND FRIDAY 39 tablet 3   carvedilol (COREG) 6.25 MG tablet TAKE 1 TABLET BY MOUTH TWICE DAILY WITH MEALS 180 tablet 3   Continuous Blood Gluc Sensor (FREESTYLE LIBRE 2 SENSOR) MISC 1 each by Does not apply route every 14 (fourteen) days. 6 each 3   ezetimibe (ZETIA) 10 MG tablet Take 1 tablet (10 mg total) by mouth daily. 90 tablet 3   hydrochlorothiazide (MICROZIDE) 12.5 MG capsule Take 1 capsule by mouth once daily 90 capsule 0   insulin glargine (LANTUS SOLOSTAR) 100 UNIT/ML Solostar Pen Inject 12 Units into the skin daily. 15 mL 3   Insulin Pen Needle 32G X 4 MM MISC 1 Device by Does not apply route in the morning, at noon, in the evening, and at bedtime. 400 each 3   levothyroxine (SYNTHROID) 88 MCG tablet Take 1 tablet (88 mcg total) by mouth daily. 90 tablet 3   lipase/protease/amylase (CREON) 36000 UNITS CPEP capsule Take 2 capsules with the first bite of each meal and 1 capsule with the first bite of each snack 240 capsule 11   meclizine (ANTIVERT) 12.5 MG tablet TAKE 1 TABLET BY MOUTH TWICE DAILY AS NEEDED FOR  DIZZINESS  (SEDATION  PRECAUTIONS) 20 tablet 0   nitroGLYCERIN (NITROSTAT) 0.4 MG SL tablet Place 1 tablet  (0.4 mg total) under the tongue every 5 (five) minutes as needed for chest pain ((MAX of 3 doses)). 25 tablet 0   NOVOLOG FLEXPEN 100 UNIT/ML FlexPen Max daily 20 units per scale 15 mL 6   oxymetazoline (AFRIN) 0.05 % nasal spray Place 1 spray into left nostril daily as needed (nose bleeds).     traMADol (ULTRAM) 50 MG tablet Take 1 tablet (50 mg total) by mouth every 8 (eight) hours as needed. 20 tablet 0   traZODone (DESYREL) 100 MG tablet TAKE 1 TABLET BY MOUTH AT BEDTIME AS NEEDED FOR SLEEP 90 tablet 0   triamcinolone ointment (KENALOG) 0.5 % Apply 1 application topically 2 (two) times daily. 30 g 1   Current Facility-Administered Medications on File Prior to Visit  Medication Dose Route Frequency Provider Last Rate Last Admin   sodium chloride flush (NS) 0.9 % injection 10 mL  10 mL Intravenous Once [name], Kathlene November, MD        BP 110/64   Pulse (!) 50   Temp (!) 97.3 F (36.3 C) (Temporal)   Ht '5\' 11"'$  (1.803 m)   Wt 151 lb (68.5 kg)   SpO2 98%   BMI 21.06 kg/m  Objective:   Physical Exam Cardiovascular:     Rate and Rhythm: Bradycardia present.  Pulmonary:     Effort: Pulmonary effort is normal.     Breath sounds: Normal breath sounds. No wheezing or rales.  Musculoskeletal:     Cervical back: Neck supple.  Skin:    General: Skin is warm and dry.  Neurological:     Mental Status: He is alert and oriented to person, place, and time.           Assessment & Plan:   Problem List Items Addressed This Visit       Other  Chronic midline low back pain without sciatica - Primary    More left sided today. No alarm signs on exam. Fortunately, today he's feeling the best he's felt over the last few months.  Suspect arthritis. Checking plain films today.  Encouraged activity as tolerated,, stretching. Continue Tylenol 1300 mg BID.  Continue Tramadol 25*-50 mg PRN, discussed that he can increase to 50 mg if needed.  Consider PT.       Relevant Orders   DG  Lumbar Spine Complete       Pleas Koch, NP

## 2022-02-22 NOTE — Patient Instructions (Signed)
Complete xray(s) prior to leaving today. I will notify you of your results once received.  You can take a full tablet of the Tramadol if needed.  Continue Tylenol everyday.  It was a pleasure meeting you!

## 2022-03-19 DIAGNOSIS — Z961 Presence of intraocular lens: Secondary | ICD-10-CM | POA: Diagnosis not present

## 2022-03-19 DIAGNOSIS — H401132 Primary open-angle glaucoma, bilateral, moderate stage: Secondary | ICD-10-CM | POA: Diagnosis not present

## 2022-03-19 DIAGNOSIS — H43822 Vitreomacular adhesion, left eye: Secondary | ICD-10-CM | POA: Diagnosis not present

## 2022-03-19 DIAGNOSIS — H04123 Dry eye syndrome of bilateral lacrimal glands: Secondary | ICD-10-CM | POA: Diagnosis not present

## 2022-03-19 DIAGNOSIS — H02831 Dermatochalasis of right upper eyelid: Secondary | ICD-10-CM | POA: Diagnosis not present

## 2022-03-19 DIAGNOSIS — H02834 Dermatochalasis of left upper eyelid: Secondary | ICD-10-CM | POA: Diagnosis not present

## 2022-03-19 DIAGNOSIS — E119 Type 2 diabetes mellitus without complications: Secondary | ICD-10-CM | POA: Diagnosis not present

## 2022-03-19 LAB — HM DIABETES EYE EXAM

## 2022-03-21 ENCOUNTER — Encounter: Payer: Self-pay | Admitting: Family Medicine

## 2022-03-27 ENCOUNTER — Telehealth: Payer: Medicare Other

## 2022-03-29 ENCOUNTER — Other Ambulatory Visit: Payer: Self-pay | Admitting: Family Medicine

## 2022-03-29 DIAGNOSIS — I10 Essential (primary) hypertension: Secondary | ICD-10-CM

## 2022-03-29 NOTE — Telephone Encounter (Signed)
Name of Medication: Tramadol Name of Pharmacy: [facility] or Written Date and Quantity: 12/10/21, #20 Last Office Visit and Type: 12/10/21, 4 mo DM f/u Next Office Visit and Type: 06/11/22, CPE Last Controlled Substance Agreement Date: 09/28/12 Last UDS: scanned 09/28/12

## 2022-04-02 NOTE — Telephone Encounter (Signed)
ERx 

## 2022-05-15 ENCOUNTER — Other Ambulatory Visit: Payer: Self-pay | Admitting: Family Medicine

## 2022-05-15 DIAGNOSIS — F5104 Psychophysiologic insomnia: Secondary | ICD-10-CM

## 2022-05-20 ENCOUNTER — Other Ambulatory Visit: Payer: Self-pay

## 2022-05-20 DIAGNOSIS — C9110 Chronic lymphocytic leukemia of B-cell type not having achieved remission: Secondary | ICD-10-CM

## 2022-05-20 DIAGNOSIS — D696 Thrombocytopenia, unspecified: Secondary | ICD-10-CM

## 2022-05-21 ENCOUNTER — Inpatient Hospital Stay: Payer: Medicare Other | Attending: Internal Medicine

## 2022-05-21 ENCOUNTER — Telehealth: Payer: Self-pay | Admitting: Surgery

## 2022-05-21 ENCOUNTER — Encounter: Payer: Self-pay | Admitting: Oncology

## 2022-05-21 ENCOUNTER — Inpatient Hospital Stay (HOSPITAL_BASED_OUTPATIENT_CLINIC_OR_DEPARTMENT_OTHER): Payer: Medicare Other | Admitting: Oncology

## 2022-05-21 VITALS — BP 137/70 | HR 81 | Temp 96.1°F | Resp 15 | Wt 145.6 lb

## 2022-05-21 DIAGNOSIS — C9110 Chronic lymphocytic leukemia of B-cell type not having achieved remission: Secondary | ICD-10-CM | POA: Insufficient documentation

## 2022-05-21 DIAGNOSIS — D696 Thrombocytopenia, unspecified: Secondary | ICD-10-CM

## 2022-05-21 DIAGNOSIS — E039 Hypothyroidism, unspecified: Secondary | ICD-10-CM | POA: Insufficient documentation

## 2022-05-21 DIAGNOSIS — E1122 Type 2 diabetes mellitus with diabetic chronic kidney disease: Secondary | ICD-10-CM | POA: Diagnosis not present

## 2022-05-21 DIAGNOSIS — N183 Chronic kidney disease, stage 3 unspecified: Secondary | ICD-10-CM | POA: Diagnosis not present

## 2022-05-21 DIAGNOSIS — Z79899 Other long term (current) drug therapy: Secondary | ICD-10-CM | POA: Diagnosis not present

## 2022-05-21 DIAGNOSIS — Z95828 Presence of other vascular implants and grafts: Secondary | ICD-10-CM | POA: Diagnosis not present

## 2022-05-21 DIAGNOSIS — Z794 Long term (current) use of insulin: Secondary | ICD-10-CM | POA: Diagnosis not present

## 2022-05-21 DIAGNOSIS — D693 Immune thrombocytopenic purpura: Secondary | ICD-10-CM | POA: Insufficient documentation

## 2022-05-21 DIAGNOSIS — I129 Hypertensive chronic kidney disease with stage 1 through stage 4 chronic kidney disease, or unspecified chronic kidney disease: Secondary | ICD-10-CM | POA: Insufficient documentation

## 2022-05-21 DIAGNOSIS — D649 Anemia, unspecified: Secondary | ICD-10-CM | POA: Insufficient documentation

## 2022-05-21 DIAGNOSIS — Z87891 Personal history of nicotine dependence: Secondary | ICD-10-CM | POA: Diagnosis not present

## 2022-05-21 LAB — CBC WITH DIFFERENTIAL (CANCER CENTER ONLY)
Abs Immature Granulocytes: 0.02 10*3/uL (ref 0.00–0.07)
Basophils Absolute: 0 10*3/uL (ref 0.0–0.1)
Basophils Relative: 0 %
Eosinophils Absolute: 0.1 10*3/uL (ref 0.0–0.5)
Eosinophils Relative: 1 %
HCT: 38.3 % — ABNORMAL LOW (ref 39.0–52.0)
Hemoglobin: 12.2 g/dL — ABNORMAL LOW (ref 13.0–17.0)
Immature Granulocytes: 0 %
Lymphocytes Relative: 19 %
Lymphs Abs: 1 10*3/uL (ref 0.7–4.0)
MCH: 30 pg (ref 26.0–34.0)
MCHC: 31.9 g/dL (ref 30.0–36.0)
MCV: 94.1 fL (ref 80.0–100.0)
Monocytes Absolute: 0.5 10*3/uL (ref 0.1–1.0)
Monocytes Relative: 9 %
Neutro Abs: 3.9 10*3/uL (ref 1.7–7.7)
Neutrophils Relative %: 71 %
Platelet Count: 144 10*3/uL — ABNORMAL LOW (ref 150–400)
RBC: 4.07 MIL/uL — ABNORMAL LOW (ref 4.22–5.81)
RDW: 12 % (ref 11.5–15.5)
WBC Count: 5.4 10*3/uL (ref 4.0–10.5)
nRBC: 0 % (ref 0.0–0.2)

## 2022-05-21 NOTE — Progress Notes (Signed)
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  INTERVAL HISTORY: Patient returns to clinic today for repeat laboratory work and routine 32-month evaluation.  He continues to feel well and remains asymptomatic.  He does not complain of any weakness or fatigue.  He denies any recent fevers or illnesses.  He has no neurologic complaints.  He has a good appetite.  He denies any night sweats. He has noted no lymphadenopathy.  He has no chest pain, shortness of breath, cough, or hemoptysis.  He denies any nausea, vomiting, constipation, or diarrhea.  He denies any melena or hematochezia.  He has no urinary complaints.  Patient offers no specific complaints today.  REVIEW OF SYSTEMS:   Review of Systems  Constitutional: Negative.  Negative for fever, malaise/fatigue and weight loss.  HENT: Negative.  Negative for nosebleeds.   Respiratory: Negative.  Negative for cough and shortness of breath.   Cardiovascular: Negative.  Negative for chest pain and leg swelling.  Gastrointestinal: Negative.  Negative for abdominal pain, blood in stool and melena.  Genitourinary: Negative.  Negative for dysuria and hematuria.  Musculoskeletal: Negative.  Negative for back pain and neck pain.  Skin: Negative.  Negative for itching and rash.  Neurological: Negative.  Negative for dizziness, sensory change, focal weakness, weakness and  headaches.  Endo/Heme/Allergies: Negative.  Does not bruise/bleed easily.  Psychiatric/Behavioral: Negative.  The patient is not nervous/anxious.     As per HPI. Otherwise, a complete review of systems is negative.  PAST MEDICAL HISTORY: Past Medical History:  Diagnosis Date   Acquired hand deformity 1962   hand saw accident at work   Anemia 10/11/2011   Arthritis    Bradycardia 10/10/2011   CAD (coronary artery disease) 10/10/2011   MI s/p PTCA (Dx-OM2 proximal concentric stenosis)   Carotid artery disease (HCC)    Cellulitis of buttock, left 11/17/2018   Chronic edema    CKD (chronic kidney disease) stage 3, GFR 30-59 ml/min (HCC)    [name]   CLL (chronic lymphocytic leukemia) ([place])    Colon polyps    Cough 04/28/2019   Dysphagia    EGD - reflux esophagitis, one area of stomach with focal intestinal metaplasia (06/2019) Dr Marius Ditch   Generalized headaches    frequent   GI bleed 07/08/2017   Glaucoma    s/p laser surgery   HLD (hyperlipidemia)    Hyperkalemia 02/28/2018   Hypertension    Hypothyroidism 10/10/2011   Iron deficiency anemia due to chronic blood loss    Squamous cell carcinoma of skin 06/15/2019   Right posterior ear. SCCis, hypertrophic, crusted   Thrombocytopenia ([place]) 10/11/2011   Type 2 diabetes with nephropathy [facility])    DM refresher course [facility] (04/2013)    PAST SURGICAL HISTORY: Past Surgical History:  Procedure Laterality Date   BACK SURGERY     cervical neck   CATARACT EXTRACTION, BILATERAL Bilateral 2017   COLONOSCOPY  11/2008   1 polyp, diverticulosis, rec rpt 5 yrs (Dr. Alferd Apa)   COLONOSCOPY  06/2014   hyperplastic polyp, rpt 5 yrs Oletta Lamas)   COLONOSCOPY WITH PROPOFOL N/A 11/17/2017   TA, HP, (Vanga, Tally Due, MD)   ESOPHAGOGASTRODUODENOSCOPY (EGD) WITH PROPOFOL N/A 11/17/2017   healing erosive gastritis, intestinal metaplasia, neg H pylori (Vanga, Tally Due, MD)   ESOPHAGOGASTRODUODENOSCOPY (EGD) WITH PROPOFOL N/A 06/28/2019   erosive  gastropathy with stigmata of recent bleed, granular tissue in esophagus biopsied- reflux esophagitis, small HH (Vanga, Tally Due, MD)   EYE SURGERY  2012   laser surgery for glaucoma   PORTACATH PLACEMENT Left 12/09/2018   Procedure: INSERTION PORT-A-CATH;  Surgeon: [name], MD;  Location: [facility] ORS;  Service: General;  Laterality: Left;   PTCA  1994, 1995   US ECHOCARDIOGRAPHY  10/2013   inferior wall hypokinesis, mild LVH, EF 50-55%, mild MR and LA dilation    FAMILY HISTORY: Family History  Problem Relation Age of Onset   Diabetes Sister    Stomach cancer Sister 43   Pneumonia Father        caused death   Other Brother        no communication with brother so unsure of any health conditions   Coronary artery disease Son 41       5v CABG and stents   Hyperlipidemia Sister    Stroke Neg Hx    Heart attack Neg Hx     ADVANCED DIRECTIVES (Y/N):  N  HEALTH MAINTENANCE: Social History   Tobacco Use   Smoking status: Former    Types: Cigarettes    Quit date: 03/04/1978    Years since quitting: 44.2   Smokeless tobacco: Never  Vaping Use   Vaping Use: Never used  Substance Use Topics   Alcohol use: No   Drug use: No     Colonoscopy:  PAP:  Bone density:  Lipid panel:  Allergies  Allergen Reactions   Lisinopril Other (See Comments)    Hyperkalemia even at 5mg  dose   Prednisone Other (See Comments)    Marked symptomatic hyperglycemia    Current Outpatient Medications  Medication Sig Dispense Refill   acetaminophen (TYLENOL) 650 MG CR tablet Take 1,300 mg by mouth 2 (two) times daily.     amLODipine (NORVASC) 10 MG tablet Take 1 tablet (10 mg total) by mouth daily. 90 tablet 3   atorvastatin (LIPITOR) 80 MG tablet TAKE 1 TABLET BY MOUTH ON MONDAY, WEDNESDAY AND FRIDAY 39 tablet 3   carvedilol (COREG) 6.25 MG tablet TAKE 1 TABLET BY MOUTH TWICE DAILY WITH MEALS 180 tablet 3   Continuous Blood Gluc Sensor (FREESTYLE LIBRE 2 SENSOR) MISC 1 each by Does not apply  route every 14 (fourteen) days. 6 each 3   ezetimibe (ZETIA) 10 MG tablet Take 1 tablet (10 mg total) by mouth daily. 90 tablet 3   hydrochlorothiazide (MICROZIDE) 12.5 MG capsule Take 1 capsule by mouth once daily 90 capsule 0   insulin glargine (LANTUS SOLOSTAR) 100 UNIT/ML Solostar Pen Inject 12 Units into the skin daily. 15 mL 3   Insulin Pen Needle 32G X 4 MM MISC 1 Device by Does not apply route in the morning, at noon, in the evening, and at bedtime. 400 each 3   levothyroxine (SYNTHROID) 88 MCG tablet Take 1 tablet (88 mcg total) by mouth daily. 90 tablet 3   lipase/protease/amylase (CREON) 36000 UNITS CPEP capsule Take 2 capsules with the first bite of each meal and 1 capsule with the first bite of each snack 240 capsule 11   NOVOLOG  FLEXPEN 100 UNIT/ML FlexPen Max daily 20 units per scale 15 mL 6   oxymetazoline (AFRIN) 0.05 % nasal spray Place 1 spray into left nostril daily as needed (nose bleeds).     traMADol (ULTRAM) 50 MG tablet TAKE 1 TABLET BY MOUTH EVERY 8 HOURS AS NEEDED 20 tablet 0   traZODone (DESYREL) 100 MG tablet TAKE 1 TABLET BY MOUTH AT BEDTIME AS NEEDED FOR SLEEP 90 tablet 0   triamcinolone ointment (KENALOG) 0.5 % Apply 1 application topically 2 (two) times daily. 30 g 1   meclizine (ANTIVERT) 12.5 MG tablet TAKE 1 TABLET BY MOUTH TWICE DAILY AS NEEDED FOR  DIZZINESS  (SEDATION  PRECAUTIONS) 20 tablet 0   nitroGLYCERIN (NITROSTAT) 0.4 MG SL tablet Place 1 tablet (0.4 mg total) under the tongue every 5 (five) minutes as needed for chest pain ((MAX of 3 doses)). 25 tablet 0   No current facility-administered medications for this visit.   Facility-Administered Medications Ordered in Other Visits  Medication Dose Route Frequency Provider Last Rate Last Admin   sodium chloride flush (NS) 0.9 % injection 10 mL  10 mL Intravenous Once Scott Huger, MD        OBJECTIVE: Vitals:   05/21/22 1022  BP: 137/70  Pulse: 81  Resp: 15  Temp: (!) 96.1 F (35.6 C)   SpO2: 99%     Body mass index is 20.31 kg/m.    ECOG FS:0 - Asymptomatic  General: Well-developed, well-nourished, no acute distress. Eyes: Pink conjunctiva, anicteric sclera. HEENT: Normocephalic, moist mucous membranes. Lungs: No audible wheezing or coughing. Heart: Regular rate and rhythm. Abdomen: Soft, nontender, no obvious distention. Musculoskeletal: No edema, cyanosis, or clubbing. Neuro: Alert, answering all questions appropriately. Cranial nerves grossly intact. Skin: No rashes or petechiae noted. Psych: Normal affect.  LAB RESULTS:  Lab Results  Component Value Date   NA 140 06/19/2021   K 4.8 06/19/2021   CL 103 06/19/2021   CO2 30 06/19/2021   GLUCOSE 227 (H) 06/19/2021   BUN 43 (H) 06/19/2021   CREATININE 1.70 (H) 06/19/2021   CALCIUM 9.7 06/19/2021   PROT 6.9 04/28/2021   ALBUMIN 4.4 05/09/2021   AST 34 04/28/2021   ALT 42 04/28/2021   ALKPHOS 89 04/28/2021   BILITOT 0.7 04/28/2021   GFRNONAA 39 (L) 04/28/2021   GFRAA 42 (L) 10/28/2019    Lab Results  Component Value Date   WBC 5.4 05/21/2022   NEUTROABS 3.9 05/21/2022   HGB 12.2 (L) 05/21/2022   HCT 38.3 (L) 05/21/2022   MCV 94.1 05/21/2022   PLT 144 (L) 05/21/2022     STUDIES: No results found.  ONCOLOGY HISTORY: Bone marrow biopsy on Jul 28, 2018 reviewed independently with 73% involvement of CLL.  Previous bone marrow biopsy on October 23, 2017 revealed only 31% involvement.  Previously, peripheral blood FISH testing 50% incidence of mutation in the ATM gene which is commonly associated with deletion 11q. 11q- is associated with an unfavorable prognosis and high risk of not responding to initial treatment. 13q- is a more common mutation and is actually associated with a more favorable prognosis. Patient had clear progression of disease in his bone marrow along with a worsening transfusion requirement.  He received 5 weekly cycles of Rituxan with mild improvement of his platelet count.  Patient  then received 4 cycles of Rituxan plus Treanda completing treatment on January 12, 2019.   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  PLAN:  CLL: See oncology history as above.  Patient's white blood cell count continues to be within normal limits.  He remains in at least a very good partial remission.  Patient does not require treatment at this time.  No intervention is needed.  If patient required retreatment, would consider Rituxan plus Treanda once again.  Return to clinic in 6 months for routine evaluation. Thrombocytopenia: Platelet count improved to 144.  Previously, he received high-dose prednisone, IVIG, and Rituxan with no appreciable durability to improve his platelet count.  Platelets improved upon treatment of his CLL. Anemia: Hemoglobin improved to 12.2.  His last blood transfusion was given on December 03, 2018.  Patient had colonoscopy on November 17, 2017 that removed 2 nonmalignant polyps.  EGD on the same day revealed nonbleeding erosive gastropathy with multiple nonbleeding duodenal ulcers.  Repeat EGD did not reveal significant pathology.  Continue follow-up with GI as scheduled. Reaction to Rituxan: Rate-based.  Patient will require premedications for any further infusions with Rituxan.     Patient expressed understanding and was in agreement with this plan. He also understands that He can call clinic at any time with any questions, concerns, or complaints.    Scott Huger, MD   05/21/2022 11:23 AM

## 2022-05-21 NOTE — Telephone Encounter (Signed)
Spoke with the patient and he is not currently on any blood thinners.

## 2022-05-21 NOTE — Telephone Encounter (Signed)
After making this patient there appointment he had some questions on does he need to stop any of his medications before he comes in for his Port removal.

## 2022-05-22 ENCOUNTER — Ambulatory Visit (INDEPENDENT_AMBULATORY_CARE_PROVIDER_SITE_OTHER): Payer: Medicare Other

## 2022-05-22 VITALS — Ht 71.0 in | Wt 145.0 lb

## 2022-05-22 DIAGNOSIS — Z0000 Encounter for general adult medical examination without abnormal findings: Secondary | ICD-10-CM | POA: Diagnosis not present

## 2022-05-22 NOTE — Progress Notes (Signed)
I connected with  Scott [name] on 05/22/22 by a audio enabled telemedicine application and verified that I am speaking with the correct person using two identifiers.  Patient Location: Home  Provider Location: Home Office  I discussed the limitations of evaluation and management by telemedicine. The patient expressed understanding and agreed to proceed.  Subjective:   Scott [name] is a 82 y.o. male who presents for Medicare Annual/Subsequent preventive examination.  Review of Systems      Cardiac Risk Factors include: advanced age (>39men, >36 women);hypertension;diabetes mellitus;male gender     Objective:    Today's Vitals   05/22/22 1052  Weight: 145 lb (65.8 kg)  Height: 5\' 11"  (1.803 m)   Body mass index is 20.22 kg/m.     05/22/2022   11:08 AM 05/21/2022   10:18 AM 11/19/2021   10:38 AM 05/17/2021   10:25 AM 04/28/2021    6:25 AM 11/17/2020   10:41 AM 05/08/2020   11:30 AM  Advanced Directives  Does Patient Have a Medical Advance Directive? Yes Yes Yes Yes No;Yes Yes Yes  Type of Paramedic of [place];Living will   [place];Living will  Living will [place];Living will  Does patient want to make changes to medical advance directive? No - Patient declined No - Patient declined    No - Patient declined Yes (ED - Information included in AVS)  Copy of [place] in Chart? Yes - validated most recent copy scanned in chart (See row information)        Would patient like information on creating a medical advance directive?      No - Patient declined Yes (ED - Information included in AVS)    Current Medications (verified) Outpatient Encounter Medications as of 05/22/2022  Medication Sig   acetaminophen (TYLENOL) 650 MG CR tablet Take 1,300 mg by mouth 2 (two) times daily.   amLODipine (NORVASC) 10 MG tablet Take 1 tablet (10 mg total) by mouth daily.   atorvastatin (LIPITOR) 80 MG tablet TAKE  1 TABLET BY MOUTH ON MONDAY, WEDNESDAY AND FRIDAY   carvedilol (COREG) 6.25 MG tablet TAKE 1 TABLET BY MOUTH TWICE DAILY WITH MEALS   Continuous Blood Gluc Sensor (FREESTYLE LIBRE 2 SENSOR) MISC 1 each by Does not apply route every 14 (fourteen) days.   ezetimibe (ZETIA) 10 MG tablet Take 1 tablet (10 mg total) by mouth daily.   hydrochlorothiazide (MICROZIDE) 12.5 MG capsule Take 1 capsule by mouth once daily   insulin glargine (LANTUS SOLOSTAR) 100 UNIT/ML Solostar Pen Inject 12 Units into the skin daily.   Insulin Pen Needle 32G X 4 MM MISC 1 Device by Does not apply route in the morning, at noon, in the evening, and at bedtime.   levothyroxine (SYNTHROID) 88 MCG tablet Take 1 tablet (88 mcg total) by mouth daily.   lipase/protease/amylase (CREON) 36000 UNITS CPEP capsule Take 2 capsules with the first bite of each meal and 1 capsule with the first bite of each snack   meclizine (ANTIVERT) 12.5 MG tablet TAKE 1 TABLET BY MOUTH TWICE DAILY AS NEEDED FOR  DIZZINESS  (SEDATION  PRECAUTIONS)   nitroGLYCERIN (NITROSTAT) 0.4 MG SL tablet Place 1 tablet (0.4 mg total) under the tongue every 5 (five) minutes as needed for chest pain ((MAX of 3 doses)).   NOVOLOG FLEXPEN 100 UNIT/ML FlexPen Max daily 20 units per scale   oxymetazoline (AFRIN) 0.05 % nasal spray Place 1 spray into  left nostril daily as needed (nose bleeds).   traMADol (ULTRAM) 50 MG tablet TAKE 1 TABLET BY MOUTH EVERY 8 HOURS AS NEEDED   traZODone (DESYREL) 100 MG tablet TAKE 1 TABLET BY MOUTH AT BEDTIME AS NEEDED FOR SLEEP   triamcinolone ointment (KENALOG) 0.5 % Apply 1 application topically 2 (two) times daily. (Patient not taking: Reported on 05/22/2022)   Facility-Administered Encounter Medications as of 05/22/2022  Medication   sodium chloride flush (NS) 0.9 % injection 10 mL    Allergies (verified) Lisinopril and Prednisone   History: Past Medical History:  Diagnosis Date   Acquired hand deformity 1962   hand saw accident  at work   Anemia 10/11/2011   Arthritis    Bradycardia 10/10/2011   CAD (coronary artery disease) 10/10/2011   MI s/p PTCA (Dx-OM2 proximal concentric stenosis)   Carotid artery disease (HCC)    Cellulitis of buttock, left 11/17/2018   Chronic edema    CKD (chronic kidney disease) stage 3, GFR 30-59 ml/min (HCC)    [name]   CLL (chronic lymphocytic leukemia) ([place])    Colon polyps    Cough 04/28/2019   Dysphagia    EGD - reflux esophagitis, one area of stomach with focal intestinal metaplasia (06/2019) Dr Marius Ditch   Generalized headaches    frequent   GI bleed 07/08/2017   Glaucoma    s/p laser surgery   HLD (hyperlipidemia)    Hyperkalemia 02/28/2018   Hypertension    Hypothyroidism 10/10/2011   Iron deficiency anemia due to chronic blood loss    Squamous cell carcinoma of skin 06/15/2019   Right posterior ear. SCCis, hypertrophic, crusted   Thrombocytopenia ([place]) 10/11/2011   Type 2 diabetes with nephropathy [facility])    DM refresher course [place] (04/2013)   Past Surgical History:  Procedure Laterality Date   BACK SURGERY     cervical neck   CATARACT EXTRACTION, BILATERAL Bilateral 2017   COLONOSCOPY  11/2008   1 polyp, diverticulosis, rec rpt 5 yrs (Dr. Oletta Lamas, [name])   COLONOSCOPY  06/2014   hyperplastic polyp, rpt 5 yrs Oletta Lamas)   COLONOSCOPY WITH PROPOFOL N/A 11/17/2017   TA, HP, (Vanga, Tally Due, MD)   ESOPHAGOGASTRODUODENOSCOPY (EGD) WITH PROPOFOL N/A 11/17/2017   healing erosive gastritis, intestinal metaplasia, neg H pylori (Vanga, Tally Due, MD)   ESOPHAGOGASTRODUODENOSCOPY (EGD) WITH PROPOFOL N/A 06/28/2019   erosive gastropathy with stigmata of recent bleed, granular tissue in esophagus biopsied- reflux esophagitis, small HH (Vanga, Tally Due, MD)   EYE SURGERY  2012   laser surgery for glaucoma   PORTACATH PLACEMENT Left 12/09/2018   Procedure: INSERTION PORT-A-CATH;  Surgeon: [name], MD;  Location: [facility] ORS;  Service: General;  Laterality: Left;   PTCA  1994,  1995   US ECHOCARDIOGRAPHY  10/2013   inferior wall hypokinesis, mild LVH, EF 50-55%, mild MR and LA dilation   Family History  Problem Relation Age of Onset   Diabetes Sister    Stomach cancer Sister 62   Pneumonia Father        caused death   Other Brother        no communication with brother so unsure of any health conditions   Coronary artery disease Son 67       5v CABG and stents   Hyperlipidemia Sister    Stroke Neg Hx    Heart attack Neg Hx    Social History   Socioeconomic History   Marital status: Widowed    Spouse name: Not  on file   Number of children: Not on file   Years of education: Not on file   Highest education level: Not on file  Occupational History   Not on file  Tobacco Use   Smoking status: Former    Types: Cigarettes    Quit date: 03/04/1978    Years since quitting: 44.2   Smokeless tobacco: Never  Vaping Use   Vaping Use: Never used  Substance and Sexual Activity   Alcohol use: No   Drug use: No   Sexual activity: Never  Other Topics Concern   Not on file  Social History Narrative   Widower. Wife passed 08/2015 from cancer. 1 dog   Occupation: retired, was route Hotel manager   Edu: 11th gr   Activity: walks dog (pomeranian) 3-4x/day, yardwork, rides bicycle.   Diet: drinks diet coke, no vegetables   Social Determinants of Health   Financial Resource Strain: Low Risk  (05/22/2022)   Overall Financial Resource Strain (CARDIA)    Difficulty of Paying Living Expenses: Not hard at all  Food Insecurity: No Food Insecurity (05/22/2022)   Hunger Vital Sign    Worried About Running Out of Food in the Last Year: Never true    Ran Out of Food in the Last Year: Never true  Transportation Needs: No Transportation Needs (05/22/2022)   PRAPARE - Hydrologist (Medical): No    Lack of Transportation (Non-Medical): No  Physical Activity: Insufficiently Active (05/22/2022)   Exercise Vital Sign    Days of Exercise per Week: 7 days     Minutes of Exercise per Session: 20 min  Stress: No Stress Concern Present (05/22/2022)   [place]    Feeling of Stress : Not at all  Social Connections: Socially Isolated (05/22/2022)   Social Connection and Isolation Panel [NHANES]    Frequency of Communication with Friends and Family: More than three times a week    Frequency of Social Gatherings with Friends and Family: Once a week    Attends Religious Services: Never    Marine scientist or Organizations: No    Attends Archivist Meetings: Never    Marital Status: Widowed    Tobacco Counseling Counseling given: Not Answered   Clinical Intake:  Pre-visit preparation completed: Yes  Pain : No/denies pain     Nutritional Risks: None Diabetes: Yes CBG done?: No Did pt. bring in CBG monitor from home?: No  How often do you need to have someone help you when you read instructions, pamphlets, or other written materials from your doctor or pharmacy?: 1 - Never  Diabetic?Nutrition Risk Assessment:  Has the patient had any N/V/D within the last 2 months?  No  Does the patient have any non-healing wounds?  No  Has the patient had any unintentional weight loss or weight gain?  No   Diabetes:  Is the patient diabetic?  Yes  If diabetic, was a CBG obtained today?  Yes  Did the patient bring in their glucometer from home?  No  How often do you monitor your CBG's? Continuous monitor.   Financial Strains and Diabetes Management:  Are you having any financial strains with the device, your supplies or your medication? No .  Does the patient want to be seen by Chronic Care Management for management of their diabetes?  No  Would the patient like to be referred to a Nutritionist or for Diabetic Management?  No   Diabetic Exams:  Diabetic Eye Exam: Completed 03/19/22 Dr.Groat Diabetic Foot Exam: Completed 12/10/21 PCP    Interpreter Needed?:  No  Information entered by :: C.Vale Peraza LPN   Activities of Daily Living    05/22/2022   11:08 AM  In your present state of health, do you have any difficulty performing the following activities:  Hearing? 1  Comment wears aids  Vision? 0  Difficulty concentrating or making decisions? 0  Walking or climbing stairs? 0  Dressing or bathing? 0  Doing errands, shopping? 0  Preparing Food and eating ? N  Using the Toilet? N  In the past six months, have you accidently leaked urine? Y  Comment occasionally  Do you have problems with loss of bowel control? N  Managing your Medications? N  Managing your Finances? N  Housekeeping or managing your Housekeeping? N    Patient Care Team: [name], MD as PCP - General (Family Medicine) Dorothy Spark, MD as PCP - Cardiology (Cardiology) Lloyd Huger, MD as Consulting Physician (Oncology) Clent Jacks, MD as Consulting Physician (Ophthalmology) Dorothy Spark, MD as Consulting Physician (Cardiology) Madelon Lips, MD as Consulting Physician (Nephrology) [name], [facility] as Pharmacist (Pharmacist)  Indicate any recent Medical Services you may have received from other than [facility] providers in the past year (date may be approximate).     Assessment:   This is a routine wellness examination for Scott [name].  Hearing/Vision screen Hearing Screening - Comments:: aids Vision Screening - Comments:: Glasses - Dr.Groat  Dietary issues and exercise activities discussed: Current Exercise Habits: Home exercise routine, Type of exercise: walking;treadmill, Time (Minutes): 30, Frequency (Times/Week): 7, Weekly Exercise (Minutes/Week): 210, Intensity: Mild, Exercise limited by: None identified   Goals Addressed             This Visit's Progress    Patient Stated       Continue to be active. Work in yard.       Depression Screen    05/22/2022   11:06 AM 05/09/2021    9:32 AM 04/11/2020   12:00 PM 02/22/2019     2:14 PM 03/19/2018    2:05 PM 02/20/2018   10:03 AM 02/19/2017    1:25 PM  PHQ 2/9 Scores  PHQ - 2 Score 0 0 0 0 0 0 0  PHQ- 9 Score   0 0  0 0    Fall Risk    05/22/2022   10:59 AM 05/09/2021    9:32 AM 04/11/2020   11:56 AM 02/22/2019    2:10 PM 12/25/2018   11:21 AM  Fall Risk   Falls in the past year? 0 0 0 1 0  Comment    tripped out of shower   Number falls in past yr: 1  0 0 0  Comment trips in the yard      Injury with Fall? 0  0 0 0  Risk for fall due to : Impaired balance/gait  Medication side effect Medication side effect   Follow up Falls prevention discussed;Falls evaluation completed;Education provided  Falls evaluation completed;Falls prevention discussed Falls evaluation completed;Falls prevention discussed     FALL RISK PREVENTION PERTAINING TO THE HOME:  Any stairs in or around the home? No  If so, are there any without handrails? No  Home free of loose throw rugs in walkways, pet beds, electrical cords, etc? Yes  Adequate lighting in your home to reduce risk of falls? Yes   ASSISTIVE DEVICES  UTILIZED TO PREVENT FALLS:  Life alert? No  Use of a cane, walker or w/c? No  Grab bars in the bathroom? Yes  Shower chair or bench in shower? Yes  Elevated toilet seat or a handicapped toilet? Yes   Cognitive Function:    04/11/2020   12:04 PM 02/22/2019    2:18 PM 02/20/2018   10:03 AM 02/19/2017    1:37 PM 02/12/2016    9:42 AM  MMSE - Mini Mental State Exam  Orientation to time 5 5 5 5 5   Orientation to Place 5 5 5 5 5   Registration 3 3 3 3 3   Attention/ Calculation 5 5 0 0 0  Recall 3 3 2 1 3   Recall-comments   unable to recall 1 of 3 word unable to recall 2 of 3 words   Language- name 2 objects   0 0 0  Language- repeat 1 1 1 1 1   Language- follow 3 step command   3 3 3   Language- read & follow direction   0 0 0  Write a sentence   0 0 0  Copy design   0 0 0  Total score   19 18 20         05/22/2022   11:10 AM  6CIT Screen  What Year? 0  points  What month? 0 points  What time? 0 points  Count back from 20 0 points  Months in reverse 4 points  Repeat phrase 2 points  Total Score 6 points    Immunizations Immunization History  Administered Date(s) Administered   Fluad Quad(high Dose 65+) 11/04/2018, 12/18/2020, 12/10/2021   Influenza Split 12/03/2011   Influenza, High Dose Seasonal PF 12/10/2019   Influenza,inj,Quad PF,6+ Mos 11/06/2012, 01/25/2014, 02/08/2015, 01/03/2016, 02/17/2017, 01/22/2018   PFIZER(Purple Top)SARS-COV-2 Vaccination 05/02/2019, 05/23/2019, 12/10/2019   Pfizer Covid-19 Vaccine Bivalent Booster 26yrs & up 12/18/2020   Pneumococcal Conjugate-13 07/21/2013   Pneumococcal Polysaccharide-23 03/11/2011   Tdap 08/23/2017   Zoster, Live 03/17/2013    TDAP status: Up to date  Flu Vaccine status: Up to date  Pneumococcal vaccine status: Up to date  Covid-19 vaccine status: Information provided on how to obtain vaccines.   Qualifies for Shingles Vaccine? Yes   Zostavax completed Yes   Shingrix Completed?: No.    Education has been provided regarding the importance of this vaccine. Patient has been advised to call insurance company to determine out of pocket expense if they have not yet received this vaccine. Advised may also receive vaccine at local pharmacy or Health Dept. Verbalized acceptance and understanding.  Screening Tests Health Maintenance  Topic Date Due   Zoster Vaccines- Shingrix (1 of 2) Never done   Diabetic kidney evaluation - Urine ACR  02/22/2020   COVID-19 Vaccine (5 - 2023-24 season) 11/02/2021   Diabetic kidney evaluation - eGFR measurement  06/20/2022   HEMOGLOBIN A1C  08/17/2022   COLONOSCOPY (Pts 45-60yrs Insurance coverage will need to be confirmed)  11/18/2022   FOOT EXAM  12/11/2022   OPHTHALMOLOGY EXAM  03/20/2023   Medicare Annual Wellness (AWV)  05/22/2023   DTaP/Tdap/Td (2 - Td or Tdap) 08/24/2027   Pneumonia Vaccine 13+ Years old  Completed   INFLUENZA VACCINE   Completed   HPV VACCINES  Aged Out    Health Maintenance  Health Maintenance Due  Topic Date Due   Zoster Vaccines- Shingrix (1 of 2) Never done   Diabetic kidney evaluation - Urine ACR  02/22/2020   COVID-19 Vaccine (5 -  2023-24 season) 11/02/2021   Diabetic kidney evaluation - eGFR measurement  06/20/2022    Colorectal cancer screening: Type of screening: Colonoscopy. Completed 11/17/2017. Repeat every 5 years will schedule.  Lung Cancer Screening: (Low Dose CT Chest recommended if Age 58-80 years, 30 pack-year currently smoking OR have quit w/in 15years.) does not qualify.   Lung Cancer Screening Referral: no  Additional Screening:  Hepatitis C Screening: does not qualify; Completed no  Vision Screening: Recommended annual ophthalmology exams for early detection of glaucoma and other disorders of the eye. Is the patient up to date with their annual eye exam?  Yes  Who is the provider or what is the name of the office in which the patient attends annual eye exams? Dr.Groat If pt is not established with a provider, would they like to be referred to a provider to establish care? No .   Dental Screening: Recommended annual dental exams for proper oral hygiene  Community Resource Referral / Chronic Care Management: CRR required this visit?  No   CCM required this visit?  No      Plan:     I have personally reviewed and noted the following in the patient's chart:   Medical and social history Use of alcohol, tobacco or illicit drugs  Current medications and supplements including opioid prescriptions. Patient is currently taking opioid prescriptions. Information provided to patient regarding non-opioid alternatives. Patient advised to discuss non-opioid treatment plan with their provider. Functional ability and status Nutritional status Physical activity Advanced directives List of other physicians Hospitalizations, surgeries, and ER visits in previous 12  months Vitals Screenings to include cognitive, depression, and falls Referrals and appointments  In addition, I have reviewed and discussed with patient certain preventive protocols, quality metrics, and best practice recommendations. A written personalized care plan for preventive services as well as general preventive health recommendations were provided to patient.     Lebron Conners, LPN   [id number]   Nurse Notes: none

## 2022-05-22 NOTE — Patient Instructions (Signed)
Scott [name] , Thank you for taking time to come for your Medicare Wellness Visit. I appreciate your ongoing commitment to your health goals. Please review the following plan we discussed and let me know if I can assist you in the future.   These are the goals we discussed:  Goals      Patient Stated     Starting 02/20/2018, I will continue to take medications as prescribed.      Patient Stated     02/22/2019, I will maintain and continue medications as prescribed.      Patient Stated     04/11/2020, I will maintain and continue medications as prescribed.      Patient Stated     Continue to be active. Work in yard.        This is a list of the screening recommended for you and due dates:  Health Maintenance  Topic Date Due   Zoster (Shingles) Vaccine (1 of 2) Never done   Yearly kidney health urinalysis for diabetes  02/22/2020   COVID-19 Vaccine (5 - 2023-24 season) 11/02/2021   Yearly kidney function blood test for diabetes  06/20/2022   Hemoglobin A1C  08/17/2022   Colon Cancer Screening  11/18/2022   Complete foot exam   12/11/2022   Eye exam for diabetics  03/20/2023   Medicare Annual Wellness Visit  05/22/2023   DTaP/Tdap/Td vaccine (2 - Td or Tdap) 08/24/2027   Pneumonia Vaccine  Completed   Flu Shot  Completed   HPV Vaccine  Aged Out   Opioid Pain Medicine Management Opioids are powerful medicines that are used to treat moderate to severe pain. When used for short periods of time, they can help you to: Sleep better. Do better in physical or occupational therapy. Feel better in the first few days after an injury. Recover from surgery. Opioids should be taken with the supervision of a trained health care provider. They should be taken for the shortest period of time possible. This is because opioids can be addictive, and the longer you take opioids, the greater your risk of addiction. This addiction can also be called opioid use disorder. What are the risks? Using  opioid pain medicines for longer than 3 days increases your risk of side effects. Side effects include: Constipation. Nausea and vomiting. Breathing difficulties (respiratory depression). Drowsiness. Confusion. Opioid use disorder. Itching. Taking opioid pain medicine for a long period of time can affect your ability to do daily tasks. It also puts you at risk for: Motor vehicle crashes. Depression. Suicide. Heart attack. Overdose, which can be life-threatening. What is a pain treatment plan? A pain treatment plan is an agreement between you and your health care provider. Pain is unique to each person, and treatments vary depending on your condition. To manage your pain, you and your health care provider need to work together. To help you do this: Discuss the goals of your treatment, including how much pain you might expect to have and how you will manage the pain. Review the risks and benefits of taking opioid medicines. Remember that a good treatment plan uses more than one approach and minimizes the chance of side effects. Be honest about the amount of medicines you take and about any drug or alcohol use. Get pain medicine prescriptions from only one health care provider. Pain can be managed with many types of alternative treatments. Ask your health care provider to refer you to one or more specialists who can help you manage pain  through: Physical or occupational therapy. Counseling (cognitive behavioral therapy). Good nutrition. Biofeedback. Massage. Meditation. Non-opioid medicine. Following a gentle exercise program. How to use opioid pain medicine Taking medicine Take your pain medicine exactly as told by your health care provider. Take it only when you need it. If your pain gets less severe, you may take less than your prescribed dose if your health care provider approves. If you are not having pain, do nottake pain medicine unless your health care provider tells you to  take it. If your pain is severe, do nottry to treat it yourself by taking more pills than instructed on your prescription. Contact your health care provider for help. Write down the times when you take your pain medicine. It is easy to become confused while on pain medicine. Writing the time can help you avoid overdose. Take other over-the-counter or prescription medicines only as told by your health care provider. Keeping yourself and others safe  While you are taking opioid pain medicine: Do not drive, use machinery, or power tools. Do not sign legal documents. Do not drink alcohol. Do not take sleeping pills. Do not supervise children by yourself. Do not do activities that require climbing or being in high places. Do not go to a lake, river, ocean, spa, or swimming pool. Do not share your pain medicine with anyone. Keep pain medicine in a locked cabinet or in a secure area where pets and children cannot reach it. Stopping your use of opioids If you have been taking opioid medicine for more than a few weeks, you may need to slowly decrease (taper) how much you take until you stop completely. Tapering your use of opioids can decrease your risk of symptoms of withdrawal, such as: Pain and cramping in the abdomen. Nausea. Sweating. Sleepiness. Restlessness. Uncontrollable shaking (tremors). Cravings for the medicine. Do not attempt to taper your use of opioids on your own. Talk with your health care provider about how to do this. Your health care provider may prescribe a step-down schedule based on how much medicine you are taking and how long you have been taking it. Getting rid of leftover pills Do not save any leftover pills. Get rid of leftover pills safely by: Taking the medicine to a prescription take-back program. This is usually offered by the county or law enforcement. Bringing them to a pharmacy that has a drug disposal container. Flushing them down the toilet. Check the label  or package insert of your medicine to see whether this is safe to do. Throwing them out in the trash. Check the label or package insert of your medicine to see whether this is safe to do. If it is safe to throw it out, remove the medicine from the original container, put it into a sealable bag or container, and mix it with used coffee grounds, food scraps, dirt, or cat litter before putting it in the trash. Follow these instructions at home: Activity Do exercises as told by your health care provider. Avoid activities that make your pain worse. Return to your normal activities as told by your health care provider. Ask your health care provider what activities are safe for you. General instructions You may need to take these actions to prevent or treat constipation: Drink enough fluid to keep your urine pale yellow. Take over-the-counter or prescription medicines. Eat foods that are high in fiber, such as beans, whole grains, and fresh fruits and vegetables. Limit foods that are high in fat and processed sugars, such as  fried or sweet foods. Keep all follow-up visits. This is important. Where to find support If you have been taking opioids for a long time, you may benefit from receiving support for quitting from a local support group or counselor. Ask your health care provider for a referral to these resources in your area. Where to find more information Centers for Disease Control and Prevention (CDC): http://www.wolf.info/ [place] Food and Drug Administration (FDA): [place] Get help right away if: You may have taken too much of an opioid (overdosed). Common symptoms of an overdose: Your breathing is slower or more shallow than normal. You have a very slow heartbeat (pulse). You have slurred speech. You have nausea and vomiting. Your pupils become very small. You have other potential symptoms: You are very confused. You faint or feel like you will faint. You have cold, clammy skin. You have blue  lips or fingernails. You have thoughts of harming yourself or harming others. These symptoms may represent a serious problem that is an emergency. Do not wait to see if the symptoms will go away. Get medical help right away. Call your local emergency services (911 in the [place] Do not drive yourself to the hospital.  If you ever feel like you may hurt yourself or others, or have thoughts about taking your own life, get help right away. Go to your nearest emergency department or: Call your local emergency services (911 in the [place] Call the [facility] 404-207-9512 in the [place] Call a suicide crisis helpline, such as the [place] at 716-436-7143 or 988 in the [place]. This is open 24 hours a day in the [place] Text the Crisis Text Line at 530 544 9771 (in the [place] Summary Opioid medicines can help you manage moderate to severe pain for a short period of time. A pain treatment plan is an agreement between you and your health care provider. Discuss the goals of your treatment, including how much pain you might expect to have and how you will manage the pain. If you think that you or someone else may have taken too much of an opioid, get medical help right away. This information is not intended to replace advice given to you by your health care provider. Make sure you discuss any questions you have with your health care provider. Document Revised: 09/13/2020 Document Reviewed: 05/31/2020 Elsevier Patient Education  [place] directives: Copy is in your chart.  Conditions/risks identified: Aim for 30 minutes of exercise or brisk walking, 6-8 glasses of water, and 5 servings of fruits and vegetables each day.   Next appointment: Follow up in one year for your annual wellness visit. 05/26/23 @ 11:30 telephone visit  Preventive Care 72 Years and Older, Male  Preventive care refers to lifestyle choices and visits with your health care  provider that can promote health and wellness. What does preventive care include? A yearly physical exam. This is also called an annual well check. Dental exams once or twice a year. Routine eye exams. Ask your health care provider how often you should have your eyes checked. Personal lifestyle choices, including: Daily care of your teeth and gums. Regular physical activity. Eating a healthy diet. Avoiding tobacco and drug use. Limiting alcohol use. Practicing safe sex. Taking low doses of aspirin every day. Taking vitamin and mineral supplements as recommended by your health care provider. What happens during an annual well check? The services and screenings done by your health care provider during your annual  well check will depend on your age, overall health, lifestyle risk factors, and family history of disease. Counseling  Your health care provider may ask you questions about your: Alcohol use. Tobacco use. Drug use. Emotional well-being. Home and relationship well-being. Sexual activity. Eating habits. History of falls. Memory and ability to understand (cognition). Work and work Statistician. Screening  You may have the following tests or measurements: Height, weight, and BMI. Blood pressure. Lipid and cholesterol levels. These may be checked every 5 years, or more frequently if you are over 59 years old. Skin check. Lung cancer screening. You may have this screening every year starting at age 60 if you have a 30-pack-year history of smoking and currently smoke or have quit within the past 15 years. Fecal occult blood test (FOBT) of the stool. You may have this test every year starting at age 110. Flexible sigmoidoscopy or colonoscopy. You may have a sigmoidoscopy every 5 years or a colonoscopy every 10 years starting at age 36. Prostate cancer screening. Recommendations will vary depending on your family history and other risks. Hepatitis C blood test. Hepatitis B blood  test. Sexually transmitted disease (STD) testing. Diabetes screening. This is done by checking your blood sugar (glucose) after you have not eaten for a while (fasting). You may have this done every 1-3 years. Abdominal aortic aneurysm (AAA) screening. You may need this if you are a current or former smoker. Osteoporosis. You may be screened starting at age 14 if you are at high risk. Talk with your health care provider about your test results, treatment options, and if necessary, the need for more tests. Vaccines  Your health care provider may recommend certain vaccines, such as: Influenza vaccine. This is recommended every year. Tetanus, diphtheria, and acellular pertussis (Tdap, Td) vaccine. You may need a Td booster every 10 years. Zoster vaccine. You may need this after age 25. Pneumococcal 13-valent conjugate (PCV13) vaccine. One dose is recommended after age 63. Pneumococcal polysaccharide (PPSV23) vaccine. One dose is recommended after age 42. Talk to your health care provider about which screenings and vaccines you need and how often you need them. This information is not intended to replace advice given to you by your health care provider. Make sure you discuss any questions you have with your health care provider. Document Released: 03/17/2015 Document Revised: 11/08/2015 Document Reviewed: 12/20/2014 Elsevier Interactive Patient Education  2017 [place] Prevention in the Home Falls can cause injuries. They can happen to people of all ages. There are many things you can do to make your home safe and to help prevent falls. What can I do on the outside of my home? Regularly fix the edges of walkways and driveways and fix any cracks. Remove anything that might make you trip as you walk through a door, such as a raised step or threshold. Trim any bushes or trees on the path to your home. Use bright outdoor lighting. Clear any walking paths of anything that might make  someone trip, such as rocks or tools. Regularly check to see if handrails are loose or broken. Make sure that both sides of any steps have handrails. Any raised decks and porches should have guardrails on the edges. Have any leaves, snow, or ice cleared regularly. Use sand or salt on walking paths during winter. Clean up any spills in your garage right away. This includes oil or grease spills. What can I do in the bathroom? Use night lights. Install grab bars by the toilet  and in the tub and shower. Do not use towel bars as grab bars. Use non-skid mats or decals in the tub or shower. If you need to sit down in the shower, use a plastic, non-slip stool. Keep the floor dry. Clean up any water that spills on the floor as soon as it happens. Remove soap buildup in the tub or shower regularly. Attach bath mats securely with double-sided non-slip rug tape. Do not have throw rugs and other things on the floor that can make you trip. What can I do in the bedroom? Use night lights. Make sure that you have a light by your bed that is easy to reach. Do not use any sheets or blankets that are too big for your bed. They should not hang down onto the floor. Have a firm chair that has side arms. You can use this for support while you get dressed. Do not have throw rugs and other things on the floor that can make you trip. What can I do in the kitchen? Clean up any spills right away. Avoid walking on wet floors. Keep items that you use a lot in easy-to-reach places. If you need to reach something above you, use a strong step stool that has a grab bar. Keep electrical cords out of the way. Do not use floor polish or wax that makes floors slippery. If you must use wax, use non-skid floor wax. Do not have throw rugs and other things on the floor that can make you trip. What can I do with my stairs? Do not leave any items on the stairs. Make sure that there are handrails on both sides of the stairs and  use them. Fix handrails that are broken or loose. Make sure that handrails are as long as the stairways. Check any carpeting to make sure that it is firmly attached to the stairs. Fix any carpet that is loose or worn. Avoid having throw rugs at the top or bottom of the stairs. If you do have throw rugs, attach them to the floor with carpet tape. Make sure that you have a light switch at the top of the stairs and the bottom of the stairs. If you do not have them, ask someone to add them for you. What else can I do to help prevent falls? Wear shoes that: Do not have high heels. Have rubber bottoms. Are comfortable and fit you well. Are closed at the toe. Do not wear sandals. If you use a stepladder: Make sure that it is fully opened. Do not climb a closed stepladder. Make sure that both sides of the stepladder are locked into place. Ask someone to hold it for you, if possible. Clearly mark and make sure that you can see: Any grab bars or handrails. First and last steps. Where the edge of each step is. Use tools that help you move around (mobility aids) if they are needed. These include: Canes. Walkers. Scooters. Crutches. Turn on the lights when you go into a dark area. Replace any light bulbs as soon as they burn out. Set up your furniture so you have a clear path. Avoid moving your furniture around. If any of your floors are uneven, fix them. If there are any pets around you, be aware of where they are. Review your medicines with your doctor. Some medicines can make you feel dizzy. This can increase your chance of falling. Ask your doctor what other things that you can do to help prevent falls. This  information is not intended to replace advice given to you by your health care provider. Make sure you discuss any questions you have with your health care provider. Document Released: 12/15/2008 Document Revised: 07/27/2015 Document Reviewed: 03/25/2014 Elsevier Interactive Patient  Education  2017 [employer].

## 2022-05-30 ENCOUNTER — Other Ambulatory Visit: Payer: Self-pay | Admitting: Family Medicine

## 2022-06-03 ENCOUNTER — Other Ambulatory Visit: Payer: Self-pay

## 2022-06-03 DIAGNOSIS — E02 Subclinical iodine-deficiency hypothyroidism: Secondary | ICD-10-CM

## 2022-06-03 MED ORDER — LEVOTHYROXINE SODIUM 88 MCG PO TABS: 88.0000 ug | ORAL_TABLET | Freq: Every day | ORAL | 3 refills | Status: DC

## 2022-06-04 ENCOUNTER — Other Ambulatory Visit: Payer: Medicare Other

## 2022-06-05 ENCOUNTER — Ambulatory Visit (INDEPENDENT_AMBULATORY_CARE_PROVIDER_SITE_OTHER): Payer: Medicare Other | Admitting: Surgery

## 2022-06-05 ENCOUNTER — Encounter: Payer: Self-pay | Admitting: Surgery

## 2022-06-05 VITALS — BP 120/68 | HR 69 | Ht 71.0 in | Wt 146.8 lb

## 2022-06-05 DIAGNOSIS — Z452 Encounter for adjustment and management of vascular access device: Secondary | ICD-10-CM | POA: Diagnosis not present

## 2022-06-05 DIAGNOSIS — Z95828 Presence of other vascular implants and grafts: Secondary | ICD-10-CM

## 2022-06-05 NOTE — Patient Instructions (Addendum)
Please see your follow up appointment listed below. You may take tylenol for the discomfort. You may shower tomorrow. Do not scrub over the area. You may allow water to run over the area while taking a shower.   Implanted Port Removal  Implanted port removal is a procedure to remove the port and catheter that are implanted under your skin. The port is a small disc under your skin that can be punctured with a needle. It is connected to a vein in your chest or neck by a small, thin tube (catheter). The implanted port is used to give medicines for treatments, and it may also be used to take blood samples. Your health care provider will remove the implanted port if: You no longer need it for treatment. It is not working properly. The area around it gets infected. Tell a health care provider about: Any allergies you have. All medicines you are taking, including vitamins, herbs, eye drops, creams, and over-the-counter medicines. Any problems you or family members have had with anesthetic medicines. Any bleeding problems you have. Any surgeries you have had. Any medical conditions you have. Whether you are pregnant or may be pregnant. What are the risks? Generally, this is a safe procedure. However, problems may occur, including: Infection. Bleeding. Allergic reactions to anesthetic medicines. Damage to nerves or blood vessels. What happens before the procedure? Medicines Ask your health care provider about: Changing or stopping your regular medicines. This is especially important if you are taking diabetes medicines or blood thinners. Taking medicines such as aspirin and ibuprofen. These medicines can thin your blood. Do not take these medicines unless your health care provider tells you to take them. Taking over-the-counter medicines, vitamins, herbs, and supplements. Tests You will have: A physical exam. Blood tests. Imaging tests, including a chest X-ray. General instructions Follow  instructions from your health care provider about eating or drinking restrictions. Ask your health care provider: How your surgery site will be marked. What steps will be taken to help prevent infection. These steps may include: Removing hair at the surgery site. Washing skin with a germ-killing soap. Taking antibiotic medicine. If you will be going home right after the procedure, plan to have a responsible adult: Take you home from the hospital or clinic. You will not be allowed to drive. Care for you for the time you are told. What happens during the procedure? You may be given one or more of the following: A medicine to help you relax (sedative). A medicine to numb the area (local anesthetic). A small incision will be made at the site of your implanted port. The implanted port and the catheter that has been inside your vein will be gently removed. The port and catheter will be inspected to make sure that all the parts have been removed. Part of the catheter may be tested for bacteria. The incision will be closed with stitches (sutures), adhesive strips, or skin glue. A bandage (dressing) will be placed over the incision. The health care provider may apply gentle pressure over the dressing for about 5 minutes. The procedure may vary among health care providers and hospitals. What happens after the procedure? Your blood pressure, heart rate, breathing rate, and blood oxygen level will be monitored until you leave the hospital or clinic. You will be monitored to make sure that there is no bleeding from the site where the port was removed. If you were given a sedative during the procedure, it can affect you for several hours. Do  not drive or operate machinery until your health care provider says that it is safe. Summary Implanted port removal is a procedure to remove the port and catheter that are implanted under your skin. Before the procedure, follow your health care provider's  instructions about changing or stopping your regular medicines. This is especially important if you are taking diabetes medicines or blood thinners. If you will be going home right after the procedure, plan to have a responsible adult care for you for the time you are told. This information is not intended to replace advice given to you by your health care provider. Make sure you discuss any questions you have with your health care provider. Document Revised: 08/22/2020 Document Reviewed: 08/22/2020 Elsevier Patient Education  [place].

## 2022-06-05 NOTE — Progress Notes (Signed)
  Procedure Date:  06/05/2022  Pre-operative Diagnosis: Left subclavian Port-A-Cath in place  Post-operative Diagnosis: Left subclavian Port-A-Cath in place  Procedure: Removal of left subclavian Port-A-Cath  Surgeon:  Melvyn Neth, MD  Anesthesia: 5 mL of 1% lidocaine with epi  Estimated Blood Loss: 5 ml  Specimens: Port-A-Cath  Complications: None  Indications for Procedure:  This is a 82 y.o. male with a history of prior left subclavian Port-A-Cath placement on 12/09/2018 for chronic lymphocytic leukemia.  He has completed treatments and wishes to have the Port-A-Cath removed.  The risks of bleeding, abscess or infection, injury to surrounding structures, and need for further procedures were all discussed with the patient and he was willing to proceed.  Description of Procedure: The patient was correctly identified at bedside.  The patient was placed supine.  Appropriate time-outs were performed.  The patient's left chest was prepped and draped in usual sterile fashion.  Local anesthetic was infused intradermally.  A 3 cm incision was made over the port, and scalpel was used to dissect down the skin and subcutaneous tissue into the port capsule.  The 2 Prolene sutures securing the port in place were cut and removed and the port was subsequently taken out of the capsule.  Then the catheter and port together were pulled while holding manual pressure over the insertion site and the subclavian vein for 5 minutes.  After this, there is no evidence of any seroma formation.  The capsule was excised to prevent any seromas.  The cavity was then irrigated and hemostasis was assured with manual pressure.  The wound was then closed in two layers using 3-0 Vicryl and 4-0 Monocryl.  The incision was cleaned and sealed with DermaBond.  The patient tolerated the procedure well and all sharps were appropriately disposed of at the end of the case.  -Patient may shower tomorrow. - May take Tylenol or  ibuprofen for pain control. -Activity precautions discussed. - Follow-up in 10 days.  Melvyn Neth, MD

## 2022-06-11 ENCOUNTER — Encounter: Payer: Medicare Other | Admitting: Family Medicine

## 2022-06-13 ENCOUNTER — Encounter: Payer: Self-pay | Admitting: Physician Assistant

## 2022-06-13 ENCOUNTER — Ambulatory Visit (INDEPENDENT_AMBULATORY_CARE_PROVIDER_SITE_OTHER): Payer: Medicare Other | Admitting: Physician Assistant

## 2022-06-13 VITALS — BP 108/70 | HR 67 | Temp 98.1°F | Ht 71.0 in | Wt 148.0 lb

## 2022-06-13 DIAGNOSIS — Z95828 Presence of other vascular implants and grafts: Secondary | ICD-10-CM

## 2022-06-13 DIAGNOSIS — Z09 Encounter for follow-up examination after completed treatment for conditions other than malignant neoplasm: Secondary | ICD-10-CM

## 2022-06-13 NOTE — Progress Notes (Signed)
[facility] POST-OP OFFICE VISIT  06/13/2022  HPI: Scott [name] is a 82 y.o. male 8 days s/p removal of left-subclavian port-a-catheter with Dr Aleen Campi   He has done well No pain No fever, chills Incisions is well healed; no erythema nor drainage   Vital signs: BP 108/70   Pulse 67   Temp 98.1 F (36.7 C)   Ht 5\' 11"  (1.803 m)   Wt 148 lb (67.1 kg)   SpO2 97%   BMI 20.64 kg/m    Physical Exam: Constitutional: Well appearing male, NAD Skin: Well healed incision to the left upper chest; dermabond intact, no erythema, no drainage. There is healing ecchymosis present.   Assessment/Plan: This is a 82 y.o. male 8 days s/p removal of left-subclavian port-a-catheter with Dr Aleen Campi    - Pain control prn  - Reviewed wound care recommendation  - He can follow up on as needed basis; He understands to call with questions/concerns  -- Lynden Oxford, PA-C [facility] 06/13/2022, 2:57 PM M-F: 7am - 4pm

## 2022-06-13 NOTE — Patient Instructions (Signed)
   Follow-up with our office as needed.  Please call and ask to speak with a nurse if you develop questions or concerns.  

## 2022-06-24 ENCOUNTER — Other Ambulatory Visit: Payer: Self-pay | Admitting: Family Medicine

## 2022-06-24 DIAGNOSIS — I10 Essential (primary) hypertension: Secondary | ICD-10-CM

## 2022-06-25 ENCOUNTER — Other Ambulatory Visit (INDEPENDENT_AMBULATORY_CARE_PROVIDER_SITE_OTHER): Payer: Medicare Other

## 2022-06-25 ENCOUNTER — Other Ambulatory Visit: Payer: Self-pay | Admitting: Family Medicine

## 2022-06-25 DIAGNOSIS — E1165 Type 2 diabetes mellitus with hyperglycemia: Secondary | ICD-10-CM

## 2022-06-25 DIAGNOSIS — E785 Hyperlipidemia, unspecified: Secondary | ICD-10-CM | POA: Diagnosis not present

## 2022-06-25 DIAGNOSIS — N1832 Chronic kidney disease, stage 3b: Secondary | ICD-10-CM | POA: Diagnosis not present

## 2022-06-25 DIAGNOSIS — E039 Hypothyroidism, unspecified: Secondary | ICD-10-CM

## 2022-06-25 DIAGNOSIS — E1169 Type 2 diabetes mellitus with other specified complication: Secondary | ICD-10-CM | POA: Diagnosis not present

## 2022-06-25 DIAGNOSIS — C9110 Chronic lymphocytic leukemia of B-cell type not having achieved remission: Secondary | ICD-10-CM

## 2022-06-25 DIAGNOSIS — D696 Thrombocytopenia, unspecified: Secondary | ICD-10-CM

## 2022-06-25 DIAGNOSIS — Z794 Long term (current) use of insulin: Secondary | ICD-10-CM

## 2022-06-25 LAB — CBC WITH DIFFERENTIAL/PLATELET
Basophils Absolute: 0 10*3/uL (ref 0.0–0.1)
Basophils Relative: 0.7 % (ref 0.0–3.0)
Eosinophils Absolute: 0.1 10*3/uL (ref 0.0–0.7)
Eosinophils Relative: 1.4 % (ref 0.0–5.0)
HCT: 39.1 % (ref 39.0–52.0)
Hemoglobin: 12.9 g/dL — ABNORMAL LOW (ref 13.0–17.0)
Lymphocytes Relative: 28 % (ref 12.0–46.0)
Lymphs Abs: 1.3 10*3/uL (ref 0.7–4.0)
MCHC: 33.1 g/dL (ref 30.0–36.0)
MCV: 92.6 fl (ref 78.0–100.0)
Monocytes Absolute: 0.4 10*3/uL (ref 0.1–1.0)
Monocytes Relative: 8.7 % (ref 3.0–12.0)
Neutro Abs: 2.8 10*3/uL (ref 1.4–7.7)
Neutrophils Relative %: 61.2 % (ref 43.0–77.0)
Platelets: 166 10*3/uL (ref 150.0–400.0)
RBC: 4.22 Mil/uL (ref 4.22–5.81)
RDW: 13 % (ref 11.5–15.5)
WBC: 4.6 10*3/uL (ref 4.0–10.5)

## 2022-06-25 LAB — COMPREHENSIVE METABOLIC PANEL
ALT: 20 U/L (ref 0–53)
AST: 21 U/L (ref 0–37)
Albumin: 4.7 g/dL (ref 3.5–5.2)
Alkaline Phosphatase: 84 U/L (ref 39–117)
BUN: 41 mg/dL — ABNORMAL HIGH (ref 6–23)
CO2: 29 mEq/L (ref 19–32)
Calcium: 10.1 mg/dL (ref 8.4–10.5)
Chloride: 104 mEq/L (ref 96–112)
Creatinine, Ser: 1.63 mg/dL — ABNORMAL HIGH (ref 0.40–1.50)
GFR: 39.12 mL/min — ABNORMAL LOW (ref >60.00)
Glucose, Bld: 184 mg/dL — ABNORMAL HIGH (ref 70–99)
Potassium: 4.3 mEq/L (ref 3.5–5.1)
Sodium: 143 mEq/L (ref 135–145)
Total Bilirubin: 0.7 mg/dL (ref 0.2–1.2)
Total Protein: 6.9 g/dL (ref 6.0–8.3)

## 2022-06-25 LAB — LIPID PANEL
Cholesterol: 156 mg/dL (ref 0–200)
HDL: 43.5 mg/dL (ref >39.00)
LDL Cholesterol: 77 mg/dL (ref 0–99)
NonHDL: 112.58
Total CHOL/HDL Ratio: 4
Triglycerides: 178 mg/dL — ABNORMAL HIGH (ref 0.0–149.0)
VLDL: 35.6 mg/dL (ref 0.0–40.0)

## 2022-06-25 LAB — HEMOGLOBIN A1C: Hgb A1c MFr Bld: 7.9 % — ABNORMAL HIGH (ref 4.6–6.5)

## 2022-06-25 LAB — MICROALBUMIN / CREATININE URINE RATIO
Creatinine,U: 88 mg/dL
Microalb Creat Ratio: 116.3 mg/g — ABNORMAL HIGH (ref 0.0–30.0)
Microalb, Ur: 102.4 mg/dL — ABNORMAL HIGH (ref 0.0–1.9)

## 2022-06-25 LAB — VITAMIN D 25 HYDROXY (VIT D DEFICIENCY, FRACTURES): VITD: 46.38 ng/mL (ref 30.00–100.00)

## 2022-06-25 LAB — TSH: TSH: 2.03 u[IU]/mL (ref 0.35–5.50)

## 2022-06-25 LAB — PHOSPHORUS: Phosphorus: 3.9 mg/dL (ref 2.3–4.6)

## 2022-06-25 NOTE — Addendum Note (Signed)
Addended by: Eual Fines on: 06/25/2022 09:55 AM   Modules accepted: Orders

## 2022-06-26 LAB — PARATHYROID HORMONE, INTACT (NO CA): PTH: 43 pg/mL (ref 16–77)

## 2022-06-30 ENCOUNTER — Other Ambulatory Visit: Payer: Self-pay | Admitting: Family Medicine

## 2022-06-30 DIAGNOSIS — I10 Essential (primary) hypertension: Secondary | ICD-10-CM

## 2022-07-02 ENCOUNTER — Encounter: Payer: Self-pay | Admitting: Family Medicine

## 2022-07-02 ENCOUNTER — Ambulatory Visit (INDEPENDENT_AMBULATORY_CARE_PROVIDER_SITE_OTHER): Payer: Medicare Other | Admitting: Family Medicine

## 2022-07-02 VITALS — BP 134/72 | HR 57 | Temp 97.5°F | Ht 69.0 in | Wt 147.2 lb

## 2022-07-02 DIAGNOSIS — Z794 Long term (current) use of insulin: Secondary | ICD-10-CM

## 2022-07-02 DIAGNOSIS — M21941 Unspecified acquired deformity of hand, right hand: Secondary | ICD-10-CM | POA: Diagnosis not present

## 2022-07-02 DIAGNOSIS — I10 Essential (primary) hypertension: Secondary | ICD-10-CM

## 2022-07-02 DIAGNOSIS — D693 Immune thrombocytopenic purpura: Secondary | ICD-10-CM

## 2022-07-02 DIAGNOSIS — Z7189 Other specified counseling: Secondary | ICD-10-CM | POA: Diagnosis not present

## 2022-07-02 DIAGNOSIS — C9110 Chronic lymphocytic leukemia of B-cell type not having achieved remission: Secondary | ICD-10-CM

## 2022-07-02 DIAGNOSIS — N1832 Chronic kidney disease, stage 3b: Secondary | ICD-10-CM | POA: Diagnosis not present

## 2022-07-02 DIAGNOSIS — I6523 Occlusion and stenosis of bilateral carotid arteries: Secondary | ICD-10-CM

## 2022-07-02 DIAGNOSIS — I2510 Atherosclerotic heart disease of native coronary artery without angina pectoris: Secondary | ICD-10-CM | POA: Diagnosis not present

## 2022-07-02 DIAGNOSIS — E1122 Type 2 diabetes mellitus with diabetic chronic kidney disease: Secondary | ICD-10-CM | POA: Diagnosis not present

## 2022-07-02 DIAGNOSIS — E785 Hyperlipidemia, unspecified: Secondary | ICD-10-CM

## 2022-07-02 DIAGNOSIS — K8681 Exocrine pancreatic insufficiency: Secondary | ICD-10-CM

## 2022-07-02 DIAGNOSIS — E039 Hypothyroidism, unspecified: Secondary | ICD-10-CM

## 2022-07-02 DIAGNOSIS — E1169 Type 2 diabetes mellitus with other specified complication: Secondary | ICD-10-CM

## 2022-07-02 DIAGNOSIS — E1142 Type 2 diabetes mellitus with diabetic polyneuropathy: Secondary | ICD-10-CM

## 2022-07-02 DIAGNOSIS — F5104 Psychophysiologic insomnia: Secondary | ICD-10-CM

## 2022-07-02 MED ORDER — TRAMADOL HCL 50 MG PO TABS: 50.0000 mg | ORAL_TABLET | Freq: Three times a day (TID) | ORAL | 0 refills | Status: DC | PRN

## 2022-07-02 MED ORDER — MECLIZINE HCL 12.5 MG PO TABS: ORAL_TABLET | ORAL | 0 refills | Status: DC

## 2022-07-02 MED ORDER — CARVEDILOL 6.25 MG PO TABS: 6.2500 mg | ORAL_TABLET | Freq: Two times a day (BID) | ORAL | 4 refills | Status: DC

## 2022-07-02 MED ORDER — TRAZODONE HCL 100 MG PO TABS: 100.0000 mg | ORAL_TABLET | Freq: Every evening | ORAL | 4 refills | Status: DC | PRN

## 2022-07-02 MED ORDER — ATORVASTATIN CALCIUM 80 MG PO TABS: ORAL_TABLET | ORAL | 4 refills | Status: DC

## 2022-07-02 MED ORDER — HYDROCHLOROTHIAZIDE 12.5 MG PO CAPS: 12.5000 mg | ORAL_CAPSULE | Freq: Every day | ORAL | 4 refills | Status: DC

## 2022-07-02 MED ORDER — AMLODIPINE BESYLATE 10 MG PO TABS: 10.0000 mg | ORAL_TABLET | Freq: Every day | ORAL | 4 refills | Status: DC

## 2022-07-02 NOTE — Assessment & Plan Note (Signed)
Advanced directives -previously wantedfull codebut with wife's death experience, likely would change decision. Has updated living will with lawyer - will bring us copy. HCPOA likely 2 sons.  

## 2022-07-02 NOTE — Assessment & Plan Note (Signed)
Chronic, stable. Continue current regimen. 

## 2022-07-02 NOTE — Assessment & Plan Note (Signed)
Chronic, stable on levothyroxine 88mcg daily.  ?

## 2022-07-02 NOTE — Assessment & Plan Note (Signed)
Continues statin, followed by cardiology

## 2022-07-02 NOTE — Assessment & Plan Note (Signed)
Chronic, continue trazodone 100mg  nightly.

## 2022-07-02 NOTE — Assessment & Plan Note (Addendum)
Chronic, stable GFR with new microalbuminuria.  Discussed SGLT2i - it appears jardiance is preferred over farxiga.  Will touch base with endo ensure ok with starting jardiance.

## 2022-07-02 NOTE — Assessment & Plan Note (Signed)
Chronic, stable on high potency atorvastatin MWF - continue this.  The ASCVD Risk score (Arnett DK, et al., 2019) failed to calculate for the following reasons:   The 2019 ASCVD risk score is only valid for ages 73 to 60

## 2022-07-02 NOTE — Patient Instructions (Addendum)
If interested, check with pharmacy about 2 shot shingles series (shingrix) as well as new RSV shot.  Bring [place] a copy of your living will.  Urine protein levels were higher this time. I will touch base with Dr Lonzo Cloud about possibly starting jardiance 10mg  daily - this is diabetes medicine that helps protect kidneys. If started, watch for recurrent bladder infections or yeast infections and let us know if any low sugars <80 develop.  Return in 6 months for hypertension follow up visit.

## 2022-07-02 NOTE — Assessment & Plan Note (Signed)
Chronic, stable, followed by oncology with stable readings.

## 2022-07-02 NOTE — Assessment & Plan Note (Deleted)
Yearly eye exam.

## 2022-07-02 NOTE — Progress Notes (Signed)
Ph: [phone]       Fax: [phone]   Patient ID: Scott [name], male    DOB: [birth year], 82 y.o.   MRN: [id number]  This visit was conducted in person.  BP 134/72   Pulse (!) 57   Temp (!) 97.5 F (36.4 C) (Temporal)   Ht 5\' 9"  (1.753 m)   Wt 147 lb 4 oz (66.8 kg)   SpO2 97%   BMI 21.75 kg/m    CC: AMW f/u visit  Subjective:   HPI: Scott [name] is a 82 y.o. male presenting on 07/02/2022 for Annual Exam (MCR prt 2 [AWV- 05/22/22].)   Saw health advisor last month for medicare wellness visit. Note reviewed.      05/22/2022   11:10 AM  6CIT Screen  What Year? 0 points  What month? 0 points  What time? 0 points  Count back from 20 0 points  Months in reverse 4 points  Repeat phrase 2 points  Total Score 6 points    No results found.  Flowsheet Row Clinical Support from 05/22/2022 in [facility] [place]  PHQ-2 Total Score 0     Saw audiologist several months ago - stable period with hearing aides ([facility]).     05/22/2022   10:59 AM 05/09/2021    9:32 AM 04/11/2020   11:56 AM 02/22/2019    2:10 PM 12/25/2018   11:21 AM  Fall Risk   Falls in the past year? 0 0 0 1 0  Comment    tripped out of shower   Number falls in past yr: 1  0 0 0  Comment trips in the yard      Injury with Fall? 0  0 0 0  Risk for fall due to : Impaired balance/gait  Medication side effect Medication side effect   Follow up Falls prevention discussed;Falls evaluation completed;Education provided  Falls evaluation completed;Falls prevention discussed Falls evaluation completed;Falls prevention discussed     CLL with 11q ATM gene mutation - followed by onc Orlie Dakin). S/p Rituxan + Treanda treatment 01/2019, in partial remission. Just had port-a-cath removed.   Known CAD with stable angina - followed by Dr Mariah Milling. Reassuring stress test last year.   Severe EPI - followed by GI (Vanga) on lifelong Creon. Presented with postprandial abdominal pain associated  with weight loss and diffuse pancreatic atrophy on CT 08/2017. Fecal elastase levels <50.   Insulin dependent T2DM followed by endocrinology Dr Lonzo Cloud. Goal A1c <7.5%. Monitors sugars with CGM. Current regimen includes mealtime novolog 5u breakfast, 4u supper + SSI, lantus 12u QAM. He worries about hypoglycemia if his sugars are <200 at bedtime.    Preventative: COLONOSCOPY WITH PROPOFOL 11/17/2017 - TA, HP (Vanga, Loel Dubonnet, MD)  ESOPHAGOGASTRODUODENOSCOPY (EGD) WITH PROPOFOL 11/17/2017 - healing erosive gastritis, intestinal metaplasia, neg H pylori (Vanga, Loel Dubonnet, MD) Prostate screening - aged out. Lung cancer screen - not eligible  Flu shot yearly COVID vaccine Pfizer 04/2019, 05/2019, booster 12/2019, bivalent 12/2020 Pneumovax 2013. Prevnar-13 07/2013 Tdap 08/2017 zostavax 2015 RSV - discussed, to consider at pharmacy shingrix - discussed, to consider at pharmacy Advanced directives - previously wanted full code but with wife's death experience, likely would change decision. Has updated living will with lawyer - will bring [place] copy. HCPOA likely 2 sons.  Seat belt use discussed.  Sunscreen use discussed. No changing moles on skin.  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 Interim medical history since our last visit reviewed. Allergies and medications reviewed and updated. Outpatient Medications Prior to Visit  Medication Sig Dispense Refill   acetaminophen (TYLENOL) 650 MG CR tablet Take 1,300 mg by mouth 2 (two) times daily.     Continuous Blood Gluc Sensor (FREESTYLE LIBRE 2 SENSOR) MISC 1 each by Does not apply  route every 14 (fourteen) days. 6 each 3   ezetimibe (ZETIA) 10 MG tablet Take 1 tablet (10 mg total) by mouth daily. 90 tablet 3   insulin glargine (LANTUS) 100 UNIT/ML Solostar Pen Inject 12 Units into the skin daily.     Insulin Pen Needle 32G X 4 MM MISC 1 Device by Does not apply route in the morning, at noon, in the evening, and at bedtime. 400 each 3   levothyroxine (SYNTHROID) 88 MCG tablet Take 1 tablet (88 mcg total) by mouth daily. 90 tablet 3   lipase/protease/amylase (CREON) 36000 UNITS CPEP capsule Take 2 capsules with the first bite of each meal and 1 capsule with the first bite of each snack 240 capsule 11   nitroGLYCERIN (NITROSTAT) 0.4 MG SL tablet Place 1 tablet (0.4 mg total) under the tongue every 5 (five) minutes as needed for chest pain ((MAX of 3 doses)). 25 tablet 0   NOVOLOG FLEXPEN 100 UNIT/ML FlexPen Max daily 20 units per scale 15 mL 6   oxymetazoline (AFRIN) 0.05 % nasal spray Place 1 spray into left nostril daily as needed (nose bleeds).     triamcinolone ointment (KENALOG) 0.5 % Apply 1 application topically 2 (two) times daily. 30 g 1   amLODipine (NORVASC) 10 MG tablet Take 1 tablet by mouth once daily 90 tablet 0   atorvastatin (LIPITOR) 80 MG tablet TAKE 1 TABLET BY MOUTH ON MONDAY, WEDNESDAY AND FRIDAY 39 tablet 3   carvedilol (COREG) 6.25 MG tablet TAKE 1 TABLET BY MOUTH TWICE DAILY WITH MEALS 180 tablet 3   hydrochlorothiazide (HYDRODIURIL) 12.5 MG tablet Take 12.5 mg by mouth daily.     hydrochlorothiazide (MICROZIDE) 12.5 MG capsule Take 1 capsule by mouth once daily 90 capsule 0   meclizine (ANTIVERT) 12.5 MG tablet TAKE 1 TABLET BY MOUTH TWICE DAILY AS NEEDED FOR  DIZZINESS  (SEDATION  PRECAUTIONS) 20 tablet 0   traMADol (ULTRAM) 50 MG tablet TAKE 1 TABLET BY MOUTH EVERY 8 HOURS AS NEEDED 20 tablet 0   traZODone (DESYREL) 100 MG tablet TAKE 1 TABLET BY MOUTH AT BEDTIME AS NEEDED FOR SLEEP 90 tablet 0   Facility-Administered Medications Prior to Visit   Medication Dose Route Frequency Provider Last Rate Last Admin   sodium chloride flush (NS) 0.9 % injection 10 mL  10 mL Intravenous Once Orlie Dakin, Tollie Pizza, MD         Per HPI unless specifically indicated in ROS section below Review of Systems  Objective:  BP 134/72   Pulse (!) 57   Temp (!) 97.5 F (36.4 C) (Temporal)   Ht 5\' 9"  (1.753 m)   Wt 147 lb 4 oz (66.8 kg)   SpO2 97%   BMI 21.75 kg/m   Wt Readings from Last 3 Encounters:  07/02/22 147 lb 4 oz (66.8 kg)  06/13/22 148 lb (67.1 kg)  06/05/22 146 lb 12.8 oz (66.6 kg)      Physical Exam Vitals and nursing note reviewed.  Constitutional:      General: He is not in acute distress.    Appearance: Normal appearance. He is well-developed. He is not ill-appearing.  HENT:     Head: Normocephalic and atraumatic.     Right Ear: Hearing, tympanic membrane, ear canal and external ear normal.     Left Ear: Hearing, tympanic membrane, ear canal and external ear normal.  Eyes:     General: No scleral icterus.    Extraocular Movements: Extraocular movements intact.     Conjunctiva/sclera: Conjunctivae normal.     Pupils: Pupils are equal, round, and reactive to light.  Neck:     Thyroid: No thyroid mass or thyromegaly.  Cardiovascular:     Rate and Rhythm: Normal rate and regular rhythm.     Pulses: Normal pulses.          Radial pulses are 2+ on the right side and 2+ on the left side.     Heart sounds: Normal heart sounds. No murmur heard. Pulmonary:     Effort: Pulmonary effort is normal. No respiratory distress.     Breath sounds: Normal breath sounds. No wheezing, rhonchi or rales.  Abdominal:     General: Bowel sounds are normal. There is no distension.     Palpations: Abdomen is soft. There is no mass.     Tenderness: There is no abdominal tenderness. There is no guarding or rebound.     Hernia: No hernia is present.  Musculoskeletal:        General: Normal range of motion.     Cervical back: Normal range of  motion and neck supple.     Right lower leg: No edema.     Left lower leg: No edema.  Lymphadenopathy:     Cervical: No cervical adenopathy.  Skin:    General: Skin is warm and dry.     Findings: No rash.  Neurological:     General: No focal deficit present.     Mental Status: He is alert and oriented to person, place, and time.  Psychiatric:        Mood and Affect: Mood normal.        Behavior: Behavior normal.        Thought Content: Thought content normal.        Judgment: Judgment normal.       Results for orders placed or performed in visit on 06/25/22  TSH  Result Value Ref Range   TSH 2.03 0.35 - 5.50 uIU/mL  Parathyroid hormone, intact (no Ca)  Result Value Ref Range   PTH 43 16 - 77 pg/mL  Microalbumin / creatinine urine ratio  Result Value Ref Range   Microalb, Ur 102.4 (H) 0.0 - 1.9 mg/dL   Creatinine,U 16.1 mg/dL   Microalb Creat Ratio 116.3 (H) 0.0 - 30.0 mg/g  VITAMIN D 25 Hydroxy (Vit-D Deficiency, Fractures)  Result Value Ref Range   VITD 46.38 30.00 - 100.00 ng/mL  Phosphorus  Result Value Ref Range   Phosphorus 3.9 2.3 - 4.6 mg/dL  Comprehensive metabolic panel  Result Value Ref Range   Sodium 143 135 - 145 mEq/L   Potassium 4.3 3.5 - 5.1 mEq/L   Chloride 104 96 - 112 mEq/L   CO2 29 19 - 32 mEq/L   Glucose, Bld  184 (H) 70 - 99 mg/dL   BUN 41 (H) 6 - 23 mg/dL   Creatinine, Ser 1.61 (H) 0.40 - 1.50 mg/dL   Total Bilirubin 0.7 0.2 - 1.2 mg/dL   Alkaline Phosphatase 84 39 - 117 U/L   AST 21 0 - 37 U/L   ALT 20 0 - 53 U/L   Total Protein 6.9 6.0 - 8.3 g/dL   Albumin 4.7 3.5 - 5.2 g/dL   GFR 09.60 (L) >45.40 mL/min   Calcium 10.1 8.4 - 10.5 mg/dL  Lipid panel  Result Value Ref Range   Cholesterol 156 0 - 200 mg/dL   Triglycerides 981.1 (H) 0.0 - 149.0 mg/dL   HDL 91.47 >82.95 mg/dL   VLDL 62.1 0.0 - 30.8 mg/dL   LDL Cholesterol 77 0 - 99 mg/dL   Total CHOL/HDL Ratio 4    NonHDL 112.58   Hemoglobin A1c  Result Value Ref Range   Hgb A1c MFr  Bld 7.9 (H) 4.6 - 6.5 %  CBC with Differential  Result Value Ref Range   WBC 4.6 4.0 - 10.5 K/uL   RBC 4.22 4.22 - 5.81 Mil/uL   Hemoglobin 12.9 (L) 13.0 - 17.0 g/dL   HCT 65.7 84.6 - 96.2 %   MCV 92.6 78.0 - 100.0 fl   MCHC 33.1 30.0 - 36.0 g/dL   RDW 95.2 84.1 - 32.4 %   Platelets 166.0 150.0 - 400.0 K/uL   Neutrophils Relative % 61.2 43.0 - 77.0 %   Lymphocytes Relative 28.0 12.0 - 46.0 %   Monocytes Relative 8.7 3.0 - 12.0 %   Eosinophils Relative 1.4 0.0 - 5.0 %   Basophils Relative 0.7 0.0 - 3.0 %   Neutro Abs 2.8 1.4 - 7.7 K/uL   Lymphs Abs 1.3 0.7 - 4.0 K/uL   Monocytes Absolute 0.4 0.1 - 1.0 K/uL   Eosinophils Absolute 0.1 0.0 - 0.7 K/uL   Basophils Absolute 0.0 0.0 - 0.1 K/uL    Assessment & Plan:   Problem List Items Addressed This Visit     CAD (coronary artery disease)    Continues statin, followed by cardiology       Relevant Medications   carvedilol (COREG) 6.25 MG tablet   amLODipine (NORVASC) 10 MG tablet   atorvastatin (LIPITOR) 80 MG tablet   hydrochlorothiazide (MICROZIDE) 12.5 MG capsule   Hypertension    Chronic, stable. Continue current regimen.       Relevant Medications   carvedilol (COREG) 6.25 MG tablet   amLODipine (NORVASC) 10 MG tablet   atorvastatin (LIPITOR) 80 MG tablet   hydrochlorothiazide (MICROZIDE) 12.5 MG capsule   Hypothyroidism    Chronic, stable on levothyroxine daily       Relevant Medications   carvedilol (COREG) 6.25 MG tablet   Idiopathic thrombocytopenic purpura (ITP) (HCC)   Hyperlipidemia associated with type 2 diabetes mellitus (HCC)    Chronic, stable on high potency atorvastatin MWF - continue this.  The ASCVD Risk score (Arnett DK, et al., 2019) failed to calculate for the following reasons:   The 2019 ASCVD risk score is only valid for ages 52 to 31       Relevant Medications   carvedilol (COREG) 6.25 MG tablet   insulin glargine (LANTUS) 100 UNIT/ML Solostar Pen   amLODipine (NORVASC) 10 MG  tablet   atorvastatin (LIPITOR) 80 MG tablet   hydrochlorothiazide (MICROZIDE) 12.5 MG capsule   CKD (chronic kidney disease) stage 3, GFR 30-59 ml/min (HCC)  Chronic, stable GFR with new microalbuminuria.  Discussed SGLT2i - it appears jardiance is preferred over farxiga.  Will touch base with endo ensure ok with starting jardiance.       Type 2 diabetes mellitus with diabetic chronic kidney disease (HCC)    Chronic, appreciate endo care.  Goal A1c <7.5%.      Relevant Medications   insulin glargine (LANTUS) 100 UNIT/ML Solostar Pen   atorvastatin (LIPITOR) 80 MG tablet   Advanced care planning/counseling discussion - Primary (Chronic)    Advanced directives - previously wanted full code but with wife's death experience, likely would change decision. Has updated living will with lawyer - will bring [place] copy. HCPOA likely 2 sons.       Diabetic neuropathy (HCC)   Relevant Medications   insulin glargine (LANTUS) 100 UNIT/ML Solostar Pen   atorvastatin (LIPITOR) 80 MG tablet   Chronic insomnia    Chronic, continue trazodone 100mg  nightly.       Relevant Medications   traZODone (DESYREL) 100 MG tablet   CLL (chronic lymphocytic leukemia) (HCC)    Chronic, stable, followed by oncology with stable readings.       Relevant Medications   meclizine (ANTIVERT) 12.5 MG tablet   traMADol (ULTRAM) 50 MG tablet   Acquired hand deformity   Carotid stenosis, asymptomatic, bilateral    Update carotid US.       Relevant Medications   carvedilol (COREG) 6.25 MG tablet   amLODipine (NORVASC) 10 MG tablet   atorvastatin (LIPITOR) 80 MG tablet   hydrochlorothiazide (MICROZIDE) 12.5 MG capsule   Other Relevant Orders   VAS US CAROTID   Exocrine pancreatic insufficiency    Continue creon. Followed by GI.         Meds ordered this encounter  Medications   carvedilol (COREG) 6.25 MG tablet    Sig: Take 1 tablet (6.25 mg total) by mouth 2 (two) times daily with a meal.    Dispense:   180 tablet    Refill:  4   meclizine (ANTIVERT) 12.5 MG tablet    Sig: TAKE 1 TABLET BY MOUTH TWICE DAILY AS NEEDED FOR  DIZZINESS  (SEDATION  PRECAUTIONS)    Dispense:  20 tablet    Refill:  0   traMADol (ULTRAM) 50 MG tablet    Sig: Take 1 tablet (50 mg total) by mouth every 8 (eight) hours as needed.    Dispense:  20 tablet    Refill:  0   amLODipine (NORVASC) 10 MG tablet    Sig: Take 1 tablet (10 mg total) by mouth daily.    Dispense:  90 tablet    Refill:  4   atorvastatin (LIPITOR) 80 MG tablet    Sig: TAKE 1 TABLET BY MOUTH ON MONDAY, WEDNESDAY AND FRIDAY    Dispense:  39 tablet    Refill:  4   hydrochlorothiazide (MICROZIDE) 12.5 MG capsule    Sig: Take 1 capsule (12.5 mg total) by mouth daily.    Dispense:  90 capsule    Refill:  4   traZODone (DESYREL) 100 MG tablet    Sig: Take 1 tablet (100 mg total) by mouth at bedtime as needed. for sleep    Dispense:  90 tablet    Refill:  4    No orders of the defined types were placed in this encounter.   Patient Instructions  If interested, check with pharmacy about 2 shot shingles series (shingrix) as well as new RSV shot.  Bring [place] a copy of your living will.  Urine protein levels were higher this time. I will touch base with Dr Lonzo Cloud about possibly starting jardiance 10mg  daily - this is diabetes medicine that helps protect kidneys. If started, watch for recurrent bladder infections or yeast infections and let us know if any low sugars <80 develop.  Return in 6 months for hypertension follow up visit.   Follow up plan: Return in about 6 months (around 01/01/2023), or if symptoms worsen or fail to improve, for follow up visit.  Eustaquio Boyden, MD

## 2022-07-02 NOTE — Assessment & Plan Note (Addendum)
Chronic, appreciate endo care.  Goal A1c <7.5%.

## 2022-07-02 NOTE — Assessment & Plan Note (Signed)
Continue creon. Followed by GI.

## 2022-07-02 NOTE — Assessment & Plan Note (Signed)
Update carotid US.  

## 2022-07-03 ENCOUNTER — Telehealth: Payer: Self-pay | Admitting: Family Medicine

## 2022-07-03 MED ORDER — EMPAGLIFLOZIN 10 MG PO TABS
10.0000 mg | ORAL_TABLET | Freq: Every day | ORAL | 6 refills | Status: DC
Start: 2022-07-03 — End: 2022-12-09

## 2022-07-03 NOTE — Telephone Encounter (Signed)
Please notify I reached out to endocrinology about starting jardiance and she was ok with this. Please start jardiance 10mg  daily. Let us know how he does with this med - ie watch for recurrent bladder or yeast infections or groin cellulitis, and let us know if sugars drop <80.

## 2022-07-04 NOTE — Telephone Encounter (Signed)
Spoke with pt relaying Dr. Timoteo Expose message. Pt states he did not pick up med due to cost of $247.00. Plz advise.

## 2022-07-09 NOTE — Telephone Encounter (Addendum)
THN patient.  Will forward to [place] to see if pt may qualify for PAP for Jardiance or Farxiga.

## 2022-07-09 NOTE — Telephone Encounter (Signed)
Patient may qualify for Jardiance PAP ([employer]). Will have pharmacy team reach out to start process.  Income limit for [employer] is $36,450 for 1-person household or $49,300 for 2-person household.

## 2022-07-13 ENCOUNTER — Emergency Department
Admission: EM | Admit: 2022-07-13 | Discharge: 2022-07-13 | Disposition: A | Discharge status: Home / Self Care | Payer: Medicare Other | Attending: Student in an Organized Health Care Education/Training Program | Admitting: Student in an Organized Health Care Education/Training Program

## 2022-07-13 ENCOUNTER — Emergency Department: Payer: Medicare Other

## 2022-07-13 ENCOUNTER — Other Ambulatory Visit: Payer: Self-pay

## 2022-07-13 DIAGNOSIS — R079 Chest pain, unspecified: Secondary | ICD-10-CM | POA: Diagnosis not present

## 2022-07-13 DIAGNOSIS — R197 Diarrhea, unspecified: Secondary | ICD-10-CM | POA: Diagnosis not present

## 2022-07-13 DIAGNOSIS — I10 Essential (primary) hypertension: Secondary | ICD-10-CM | POA: Diagnosis not present

## 2022-07-13 DIAGNOSIS — R1032 Left lower quadrant pain: Secondary | ICD-10-CM | POA: Diagnosis not present

## 2022-07-13 DIAGNOSIS — I2510 Atherosclerotic heart disease of native coronary artery without angina pectoris: Secondary | ICD-10-CM | POA: Insufficient documentation

## 2022-07-13 DIAGNOSIS — R531 Weakness: Secondary | ICD-10-CM | POA: Insufficient documentation

## 2022-07-13 DIAGNOSIS — R112 Nausea with vomiting, unspecified: Secondary | ICD-10-CM | POA: Diagnosis not present

## 2022-07-13 DIAGNOSIS — Z856 Personal history of leukemia: Secondary | ICD-10-CM | POA: Insufficient documentation

## 2022-07-13 DIAGNOSIS — E119 Type 2 diabetes mellitus without complications: Secondary | ICD-10-CM | POA: Diagnosis not present

## 2022-07-13 DIAGNOSIS — N281 Cyst of kidney, acquired: Secondary | ICD-10-CM | POA: Diagnosis not present

## 2022-07-13 DIAGNOSIS — K5730 Diverticulosis of large intestine without perforation or abscess without bleeding: Secondary | ICD-10-CM | POA: Diagnosis not present

## 2022-07-13 LAB — URINALYSIS, ROUTINE W REFLEX MICROSCOPIC
Bilirubin Urine: NEGATIVE
Glucose, UA: NEGATIVE mg/dL
Hgb urine dipstick: NEGATIVE
Leukocytes,Ua: NEGATIVE
Nitrite: NEGATIVE
Protein, ur: 100 mg/dL — AB
Specific Gravity, Urine: 1.025 (ref 1.005–1.030)
pH: 5 (ref 5.0–8.0)

## 2022-07-13 LAB — CBC
HCT: 38.7 % — ABNORMAL LOW (ref 39.0–52.0)
Hemoglobin: 12.5 g/dL — ABNORMAL LOW (ref 13.0–17.0)
MCH: 30 pg (ref 26.0–34.0)
MCHC: 32.3 g/dL (ref 30.0–36.0)
MCV: 93 fL (ref 80.0–100.0)
Platelets: 161 10*3/uL (ref 150–400)
RBC: 4.16 MIL/uL — ABNORMAL LOW (ref 4.22–5.81)
RDW: 12.1 % (ref 11.5–15.5)
WBC: 3.5 10*3/uL — ABNORMAL LOW (ref 4.0–10.5)
nRBC: 0 % (ref 0.0–0.2)

## 2022-07-13 LAB — COMPREHENSIVE METABOLIC PANEL
ALT: 19 U/L (ref 0–44)
AST: 25 U/L (ref 15–41)
Albumin: 3.9 g/dL (ref 3.5–5.0)
Alkaline Phosphatase: 84 U/L (ref 38–126)
Anion gap: 9 (ref 5–15)
BUN: 55 mg/dL — ABNORMAL HIGH (ref 8–23)
CO2: 23 mmol/L (ref 22–32)
Calcium: 9.1 mg/dL (ref 8.9–10.3)
Chloride: 105 mmol/L (ref 98–111)
Creatinine, Ser: 1.78 mg/dL — ABNORMAL HIGH (ref 0.61–1.24)
GFR, Estimated: 38 mL/min — ABNORMAL LOW (ref >60)
Glucose, Bld: 214 mg/dL — ABNORMAL HIGH (ref 70–99)
Potassium: 3.8 mmol/L (ref 3.5–5.1)
Sodium: 137 mmol/L (ref 135–145)
Total Bilirubin: 0.8 mg/dL (ref 0.3–1.2)
Total Protein: 7 g/dL (ref 6.5–8.1)

## 2022-07-13 LAB — LACTIC ACID, PLASMA: Lactic Acid, Venous: 1.5 mmol/L (ref 0.5–1.9)

## 2022-07-13 LAB — LIPASE, BLOOD: Lipase: 25 U/L (ref 11–51)

## 2022-07-13 MED ORDER — ONDANSETRON HCL 4 MG/2ML IJ SOLN
4.0000 mg | Freq: Once | INTRAMUSCULAR | Status: AC
  Administered 2022-07-13: 4 mg via INTRAVENOUS
  Filled 2022-07-13: qty 2

## 2022-07-13 MED ORDER — ONDANSETRON 4 MG PO TBDP: 4.0000 mg | ORAL_TABLET | Freq: Three times a day (TID) | ORAL | 0 refills | Status: AC | PRN

## 2022-07-13 MED ORDER — IOHEXOL 300 MG/ML  SOLN
80.0000 mL | Freq: Once | INTRAMUSCULAR | Status: AC | PRN
  Administered 2022-07-13: 80 mL via INTRAVENOUS

## 2022-07-13 MED ORDER — SODIUM CHLORIDE 0.9 % IV BOLUS
1000.0000 mL | Freq: Once | INTRAVENOUS | Status: AC
  Administered 2022-07-13: 1000 mL via INTRAVENOUS

## 2022-07-13 NOTE — ED Provider Notes (Signed)
[facility] Provider Note    Event Date/Time   First MD Initiated Contact with Patient 07/13/22 1529     (approximate)   History   Emesis   HPI  Scott [name] is a 82 y.o. male with a history of CLL, ITP, CAD, diabetes presents to the ER for evaluation of nausea vomiting diarrhea for 8 days.  Denies any abdominal pain no cough or congestion just neurolysed malaise and feeling unwell.  Denies any chest pain or pressure.  No recent antibiotics.  Denies any melena or hematochezia.     Physical Exam   Triage Vital Signs: ED Triage Vitals  Enc Vitals Group     BP 07/13/22 1121 130/77     Pulse Rate 07/13/22 1121 86     Resp 07/13/22 1121 17     Temp 07/13/22 1121 98.1 F (36.7 C)     Temp Source 07/13/22 1121 Oral     SpO2 07/13/22 1121 95 %     Weight --      Height --      Head Circumference --      Peak Flow --      Pain Score 07/13/22 1118 3     Pain Loc --      Pain Edu? --      Excl. in GC? --     Most recent vital signs: Vitals:   07/13/22 1121 07/13/22 1344  BP: 130/77 137/74  Pulse: 86 83  Resp: 17 18  Temp: 98.1 F (36.7 C)   SpO2: 95% 98%     Constitutional: Alert  Eyes: Conjunctivae are normal.  Head: Atraumatic. Nose: No congestion/rhinnorhea. Mouth/Throat: Mucous membranes are moist.   Neck: Painless ROM.  Cardiovascular:   Good peripheral circulation. Respiratory: Normal respiratory effort.  No retractions.  Gastrointestinal: Soft and nontender.  Musculoskeletal:  no deformity Neurologic:  MAE spontaneously. No gross focal neurologic deficits are appreciated.  Skin:  Skin is warm, dry and intact. No rash noted. Psychiatric: Mood and affect are normal. Speech and behavior are normal.    ED Results / Procedures / Treatments   Labs (all labs ordered are listed, but only abnormal results are displayed) Labs Reviewed  COMPREHENSIVE METABOLIC PANEL - Abnormal; Notable for the following components:      Result  Value   Glucose, Bld 214 (*)    BUN 55 (*)    Creatinine, Ser 1.78 (*)    GFR, Estimated 38 (*)    All other components within normal limits  CBC - Abnormal; Notable for the following components:   WBC 3.5 (*)    RBC 4.16 (*)    Hemoglobin 12.5 (*)    HCT 38.7 (*)    All other components within normal limits  URINALYSIS, ROUTINE W REFLEX MICROSCOPIC - Abnormal; Notable for the following components:   APPearance CLEAR (*)    Ketones, ur TRACE (*)    Protein, ur 100 (*)    Bacteria, UA RARE (*)    All other components within normal limits  LIPASE, BLOOD  LACTIC ACID, PLASMA  LACTIC ACID, PLASMA     EKG ED ECG REPORT I, Willy Eddy, the attending physician, personally viewed and interpreted this ECG.   Date: 07/13/2022  EKG Time: 11:28  Rate: 75  Rhythm: sinus  Axis: normal  Intervals: normal  ST&T Change: no stemi, no depression     RADIOLOGY Please see ED Course for my review and interpretation.  I personally reviewed  all radiographic images ordered to evaluate for the above acute complaints and reviewed radiology reports and findings.  These findings were personally discussed with the patient.  Please see medical record for radiology report.    PROCEDURES:  Critical Care performed: No  Procedures   MEDICATIONS ORDERED IN ED: Medications  sodium chloride 0.9 % bolus 1,000 mL (0 mLs Intravenous Stopped 07/13/22 1752)  ondansetron (ZOFRAN) injection 4 mg (4 mg Intravenous Given 07/13/22 1549)  iohexol (OMNIPAQUE) 300 MG/ML solution 80 mL (80 mLs Intravenous Contrast Given 07/13/22 1646)     IMPRESSION / MDM / ASSESSMENT AND PLAN / ED COURSE  I reviewed the triage vital signs and the nursing notes.                              Differential diagnosis includes, but is not limited to, neuritis, ileus, electrolyte abnormality, dehydration, SBO, diverticulitis  Patient presenting to the ER for evaluation of symptoms as described above.  Based on symptoms,  risk factors and considered above differential, this presenting complaint could reflect a potentially life-threatening illness therefore the patient will be placed on continuous pulse oximetry and telemetry for monitoring.  Laboratory evaluation will be sent to evaluate for the above complaints.      Clinical Course as of 07/13/22 1835  Sat Jul 13, 2022  1612 Chest x-ray on my review and interpretation with right effusion, no pneumothorax. [PR]  1724 CT imaging on my review and interpretation without evidence of obstructive pattern will await formal radiology report. [PR]  1832 Reassessed.  He feels significantly improved after IV hydration and IV antiemetics.  Imaging more likely reflects some enteritis which has been improved symptomatically.  He is tolerating p.o.  We discussed option for further observation IV hydration versus discharge with prescription for antiemetics and patient feels comfortable plan for discharge.  Discussed strict return precautions.  Patient agreeable with plan. [PR]    Clinical Course User Index [PR] Willy Eddy, MD     FINAL CLINICAL IMPRESSION(S) / ED DIAGNOSES   Final diagnoses:  Nausea vomiting and diarrhea     Rx / DC Orders   ED Discharge Orders          Ordered    ondansetron (ZOFRAN-ODT) 4 MG disintegrating tablet  Every 8 hours PRN        07/13/22 1834             Note:  This document was prepared using Dragon voice recognition software and may include unintentional dictation errors.    Willy Eddy, MD 07/13/22 [id number]

## 2022-07-13 NOTE — ED Triage Notes (Addendum)
Pt c/o emesis after eating and diarrhea x1 week. Pt denies SHOB, dizziness, CP. Pt reports weakness and LLQ abdominal pain. Pt AOX4, NAD noted. Pt has leukemia, no chemo/radiation x2 years

## 2022-07-13 NOTE — ED Provider Triage Note (Signed)
Emergency Medicine Provider Triage Evaluation Note  Scott [name] , a 82 y.o. male  was evaluated in triage.  Pt complains of abdominal pain with vomiting and diarrhea for 1 week. Pressure LLQ area at times. No one in family with same.    Review of Systems  Positive: Watery diarrhea and vomiting   + pressure  Negative: No fever, chills, SOB, Dizziness  Physical Exam  BP 130/77   Pulse 86   Temp 98.1 F (36.7 C) (Oral)   Resp 17   SpO2 95%  Gen:   Awake, no distress   Resp:  Normal effort Lungs clear.  MSK:   Moves extremities without difficulty  Other:  Abdomen without tenderness at present.  BS active.  No distention.   Medical Decision Making  Medically screening exam initiated at 11:22 AM.  Appropriate orders placed.  VALEK KURAMOTO was informed that the remainder of the evaluation will be completed by another provider, this initial triage assessment does not replace that evaluation, and the importance of remaining in the ED until their evaluation is complete.     Tommi Rumps, PA-C 07/13/22 1126

## 2022-07-15 NOTE — Progress Notes (Signed)
Unsuccessful outreach to the patient to see if any interest in Jardiance PAP. Message left with all information if any interest.  Al Corpus, PharmD notified  Burt Knack, [facility] Clinical Pharmacy Assistant [phone]

## 2022-07-16 ENCOUNTER — Ambulatory Visit (INDEPENDENT_AMBULATORY_CARE_PROVIDER_SITE_OTHER): Payer: Medicare Other | Admitting: Family Medicine

## 2022-07-16 ENCOUNTER — Telehealth: Payer: Self-pay | Admitting: *Deleted

## 2022-07-16 ENCOUNTER — Ambulatory Visit: Payer: Medicare Other | Admitting: Pharmacist

## 2022-07-16 ENCOUNTER — Ambulatory Visit: Payer: Self-pay

## 2022-07-16 ENCOUNTER — Encounter: Payer: Self-pay | Admitting: Family Medicine

## 2022-07-16 ENCOUNTER — Other Ambulatory Visit: Payer: Self-pay | Admitting: Radiology

## 2022-07-16 VITALS — BP 120/68 | HR 78 | Temp 97.1°F | Ht 69.0 in | Wt 145.2 lb

## 2022-07-16 DIAGNOSIS — K8681 Exocrine pancreatic insufficiency: Secondary | ICD-10-CM

## 2022-07-16 DIAGNOSIS — Z794 Long term (current) use of insulin: Secondary | ICD-10-CM

## 2022-07-16 DIAGNOSIS — E1122 Type 2 diabetes mellitus with diabetic chronic kidney disease: Secondary | ICD-10-CM

## 2022-07-16 DIAGNOSIS — R197 Diarrhea, unspecified: Secondary | ICD-10-CM | POA: Insufficient documentation

## 2022-07-16 DIAGNOSIS — N1832 Chronic kidney disease, stage 3b: Secondary | ICD-10-CM | POA: Diagnosis not present

## 2022-07-16 DIAGNOSIS — C9110 Chronic lymphocytic leukemia of B-cell type not having achieved remission: Secondary | ICD-10-CM | POA: Diagnosis not present

## 2022-07-16 DIAGNOSIS — A045 Campylobacter enteritis: Secondary | ICD-10-CM | POA: Diagnosis not present

## 2022-07-16 HISTORY — DX: Campylobacter enteritis: A04.5

## 2022-07-16 NOTE — Assessment & Plan Note (Signed)
Continues creon.

## 2022-07-16 NOTE — Assessment & Plan Note (Addendum)
Insulin currently on hold while decreased PO intake with current GI illness.

## 2022-07-16 NOTE — Transitions of Care (Post Inpatient/ED Visit) (Signed)
07/16/2022  Name: Scott [name] MRN: [id number] DOB: May 12, 1940  Today's TOC FU Call Status: Today's TOC FU Call Status:: Successful TOC FU Call Competed TOC FU Call Complete Date: 07/16/22  Transition Care Management Follow-up Telephone Call Date of Discharge: 07/14/22 Discharge Facility: [facility]) Type of Discharge: Emergency Department Reason for ED Visit: Other: (nausea, vomiting and diarrhea) How have you been since you were released from the hospital?: Same Any questions or concerns?: No  Items Reviewed: Did you receive and understand the discharge instructions provided?: Yes Medications obtained,verified, and reconciled?: Yes (Medications Reviewed) Any new allergies since your discharge?: No Dietary orders reviewed?: No Do you have support at home?: Yes People in Home: alone Name of Support/Comfort Primary Source: Dorinda Hill  Medications Reviewed Today: Medications Reviewed Today     Reviewed by Luella Cook, RN (Case Manager) on 07/16/22 at 1142  Med List Status: <None>   Medication Order Taking? Sig Documenting Provider Last Dose Status Informant  acetaminophen (TYLENOL) 650 MG CR tablet [id number] Yes Take 1,300 mg by mouth 2 (two) times daily. [provider] Taking Active Self           Med Note Sherilyn Cooter, [facility] [address]   Thu Sep 27, 2021  9:46 AM) Prn last dose this am   amLODipine (NORVASC) 10 MG tablet [id number] Yes Take 1 tablet (10 mg total) by mouth daily. Eustaquio Boyden, MD Taking Active   atorvastatin (LIPITOR) 80 MG tablet [id number] Yes TAKE 1 TABLET BY MOUTH ON MONDAY, [facility] AND Adria Dill, MD Taking Active   carvedilol (COREG) 6.25 MG tablet [id number] Yes Take 1 tablet (6.25 mg total) by mouth 2 (two) times daily with a meal. Eustaquio Boyden, MD Taking Active   Continuous Blood Gluc Sensor (FREESTYLE LIBRE 2 SENSOR) [place] [id number] Yes 1 each by Does not apply route every 14 (fourteen) days.  Eustaquio Boyden, MD Taking Active   empagliflozin (JARDIANCE) 10 MG TABS tablet [id number] No Take 1 tablet (10 mg total) by mouth daily before breakfast.  Patient not taking: Reported on 07/16/2022   Eustaquio Boyden, MD Not Taking Active   ezetimibe (ZETIA) 10 MG tablet [id number] Yes Take 1 tablet (10 mg total) by mouth daily. Antonieta Iba, MD Taking Active   hydrochlorothiazide (MICROZIDE) 12.5 MG capsule [id number] Yes Take 1 capsule (12.5 mg total) by mouth daily. Eustaquio Boyden, MD Taking Active   insulin glargine (LANTUS) 100 UNIT/ML Solostar Pen [id number] No Inject 12 Units into the skin daily.  Patient not taking: Reported on 07/16/2022   [provider] Not Taking Active Self  Insulin Pen Needle 32G X 4 MM MISC [id number]  1 Device by Does not apply route in the morning, at noon, in the evening, and at bedtime. Shamleffer, Konrad Dolores, MD  Active   levothyroxine (SYNTHROID) 88 MCG tablet [id number] Yes Take 1 tablet (88 mcg total) by mouth daily. Eustaquio Boyden, MD Taking Active   lipase/protease/amylase (CREON) 36000 UNITS CPEP capsule [id number] Yes Take 2 capsules with the first bite of each meal and 1 capsule with the first bite of each snack Vanga, Loel Dubonnet, MD Taking Active   meclizine (ANTIVERT) 12.5 MG tablet [id number] Yes TAKE 1 TABLET BY MOUTH TWICE DAILY AS NEEDED FOR  DIZZINESS  (SEDATION  PRECAUTIONS) Eustaquio Boyden, MD Taking Active   nitroGLYCERIN (NITROSTAT) 0.4 MG SL tablet [id number] Yes Place 1 tablet (0.4 mg total) under the tongue every 5 (five) minutes as needed for chest pain ((MAX of 3  doses)). Eustaquio Boyden, MD Taking Active Self           Med Note Renelda Loma   Thu Sep 27, 2021  9:47 AM) Prn last dose years ago  NOVOLOG FLEXPEN 100 UNIT/ML FlexPen [id number] No Max daily 20 units per scale  Patient not taking: Reported on 07/16/2022   Shamleffer, Konrad Dolores, MD Not Taking Active   ondansetron (ZOFRAN-ODT) 4 MG disintegrating  tablet [id number] Yes Take 1 tablet (4 mg total) by mouth every 8 (eight) hours as needed for nausea or vomiting. Willy Eddy, MD Taking Active   oxymetazoline (AFRIN) 0.05 % nasal spray [id number] Yes Place 1 spray into left nostril daily as needed (nose bleeds). [provider] Taking Active Self  traMADol (ULTRAM) 50 MG tablet [id number] Yes Take 1 tablet (50 mg total) by mouth every 8 (eight) hours as needed. Eustaquio Boyden, MD Taking Active   traZODone (DESYREL) 100 MG tablet [id number] Yes Take 1 tablet (100 mg total) by mouth at bedtime as needed. for sleep Eustaquio Boyden, MD Taking Active   triamcinolone ointment (KENALOG) 0.5 % [id number] Yes Apply 1 application topically 2 (two) times daily. Hannah Beat, MD Taking Active             Home Care and Equipment/Supplies: Were Home Health Services Ordered?: NA Any new equipment or medical supplies ordered?: NA  Functional Questionnaire: Do you need assistance with bathing/showering or dressing?: No Do you need assistance with meal preparation?: Yes Do you need assistance with eating?: No Do you have difficulty maintaining continence: No Do you need assistance with getting out of bed/getting out of a chair/moving?: No  Follow up appointments reviewed: PCP Follow-up appointment confirmed?: Yes Date of PCP follow-up appointment?: 07/16/22 Follow-up Provider: Dr Carolynn Sayers 12:30 Specialist [facility] Follow-up appointment confirmed?: NA Do you need transportation to your follow-up appointment?: No Do you understand care options if your condition(s) worsen?: Yes-patient verbalized understanding  SDOH Interventions Today    Flowsheet Row Most Recent Value  SDOH Interventions   Food Insecurity Interventions Intervention Not Indicated  Housing Interventions Intervention Not Indicated  Transportation Interventions Intervention Not Indicated      Interventions Today    Flowsheet Row Most Recent Value  General  Interventions   General Interventions Discussed/Reviewed General Interventions Discussed, General Interventions Reviewed, Doctor Visits  Doctor Visits Discussed/Reviewed Doctor Visits Discussed      TOC Interventions Today    Flowsheet Row Most Recent Value  TOC Interventions   TOC Interventions Discussed/Reviewed TOC Interventions Discussed, TOC Interventions Reviewed        Gean Maidens BSN RN [facility] Care Management [phone]
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
## 2022-07-16 NOTE — Chronic Care Management (AMB) (Signed)
   07/16/2022  Scott [name] [birth year] [id number]   Reason for Encounter: Patient is not currently enrolled in the CCM program. CCM status changed to previously enrolled  Alto Denver RN, MSN, CCM RN Care Manager  Chronic Care Management Direct Number: [phone]

## 2022-07-16 NOTE — Assessment & Plan Note (Signed)
Nausea/vomiting has resolved but watery diarrhea persists.  Check stool studies r/o infection (C diff and other).  Supportive measures reviewed - continue zofran, imodium PRN, push fluids.  Update if new or worsening symptoms develop

## 2022-07-16 NOTE — Patient Instructions (Signed)
Pass by lab for stool tests.  Continue pushing fluids - small sips of fluids throughout the day, bland diet as tolerated.  May continue zofran and imodium as needed, follow instructions on bottle.  Let us know if fever, abdominal pain, blood in stool develops.

## 2022-07-16 NOTE — Progress Notes (Signed)
Ph: [phone] Fax: [phone]   Patient ID: Scott [name], male    DOB: 1940-06-17, 82 y.o.   MRN: [id number]  This visit was conducted in person.  BP 120/68   Pulse 78   Temp (!) 97.1 F (36.2 C) (Temporal)   Ht 5\' 9"  (1.753 m)   Wt 145 lb 4 oz (65.9 kg)   SpO2 94%   BMI 21.45 kg/m    CC: ER f/u visit  Subjective:   HPI: Scott [name] is a 82 y.o. male presenting on 07/16/2022 for Hospitalization Follow-up (Seen on 07/13/22 at [facility] ED, dx nausea, vomiting, diarrhea. C/o ongoing sxs. Pt accompanied by son, Dorinda Hill. )   Seen at ER over weekend with 8d nausea, vomiting, diarrhea without abdominal pain, fever, blood in stool. Reassuring workup including CXR (minimal LLL opacity thought atx), CT scan showing possible enteritis, treated with IVF and antiemetics with benefit. Discharged with oral zofran PRN.   Stools more malodorous than norma.  Vomiting has improved but notes persistent diarrhea - 2-3 times a day, with watery stools.  He's treating with imodium at home - 2 tablets once a day without benefit.   He notes this is 3rd week of GI illness.  No fevers/chills, abd pain, black tarry stools or blood in stool.   No recent abx use. No sick contacts at home.  No new medicines, vitamins, supplements.   Contrasted CT abd/pelvis IMPRESSION: 1. Mildly distended small bowel loops with air-fluid levels and air-fluid level in the stomach. Findings may represent ileus and/or enteritis. No evidence for bowel obstruction. 2. Colonic diverticulosis without evidence for diverticulitis. 3. Prominent, but nonenlarged central mesenteric lymph nodes are likely reactive. 4. Bilateral renal cysts. No follow-up imaging recommended. Aortic Atherosclerosis (ICD10-I70.0). Electronically Signed By: Darliss Cheney M.D. On: 07/13/2022 17:34      Relevant past medical, surgical, family and social history reviewed and updated as indicated. Interim medical history since our last visit  reviewed. Allergies and medications reviewed and updated. Outpatient Medications Prior to Visit  Medication Sig Dispense Refill   acetaminophen (TYLENOL) 650 MG CR tablet Take 1,300 mg by mouth 2 (two) times daily.     amLODipine (NORVASC) 10 MG tablet Take 1 tablet (10 mg total) by mouth daily. 90 tablet 4   atorvastatin (LIPITOR) 80 MG tablet TAKE 1 TABLET BY MOUTH ON MONDAY, WEDNESDAY AND FRIDAY 39 tablet 4   carvedilol (COREG) 6.25 MG tablet Take 1 tablet (6.25 mg total) by mouth 2 (two) times daily with a meal. 180 tablet 4   Continuous Blood Gluc Sensor (FREESTYLE LIBRE 2 SENSOR) MISC 1 each by Does not apply route every 14 (fourteen) days. 6 each 3   empagliflozin (JARDIANCE) 10 MG TABS tablet Take 1 tablet (10 mg total) by mouth daily before breakfast. 30 tablet 6   ezetimibe (ZETIA) 10 MG tablet Take 1 tablet (10 mg total) by mouth daily. 90 tablet 3   hydrochlorothiazide (MICROZIDE) 12.5 MG capsule Take 1 capsule (12.5 mg total) by mouth daily. 90 capsule 4   insulin glargine (LANTUS) 100 UNIT/ML Solostar Pen Inject 12 Units into the skin daily.     Insulin Pen Needle 32G X 4 MM MISC 1 Device by Does not apply route in the morning, at noon, in the evening, and at bedtime. 400 each 3   levothyroxine (SYNTHROID) 88 MCG tablet Take 1 tablet (88 mcg total) by mouth daily. 90 tablet 3   lipase/protease/amylase (CREON) 36000 UNITS CPEP capsule Take 2  capsules with the first bite of each meal and 1 capsule with the first bite of each snack 240 capsule 11   meclizine (ANTIVERT) 12.5 MG tablet TAKE 1 TABLET BY MOUTH TWICE DAILY AS NEEDED FOR  DIZZINESS  (SEDATION  PRECAUTIONS) 20 tablet 0   nitroGLYCERIN (NITROSTAT) 0.4 MG SL tablet Place 1 tablet (0.4 mg total) under the tongue every 5 (five) minutes as needed for chest pain ((MAX of 3 doses)). 25 tablet 0   NOVOLOG FLEXPEN 100 UNIT/ML FlexPen Max daily 20 units per scale 15 mL 6   ondansetron (ZOFRAN-ODT) 4 MG disintegrating tablet Take 1  tablet (4 mg total) by mouth every 8 (eight) hours as needed for nausea or vomiting. 12 tablet 0   oxymetazoline (AFRIN) 0.05 % nasal spray Place 1 spray into left nostril daily as needed (nose bleeds).     traMADol (ULTRAM) 50 MG tablet Take 1 tablet (50 mg total) by mouth every 8 (eight) hours as needed. 20 tablet 0   traZODone (DESYREL) 100 MG tablet Take 1 tablet (100 mg total) by mouth at bedtime as needed. for sleep 90 tablet 4   triamcinolone ointment (KENALOG) 0.5 % Apply 1 application topically 2 (two) times daily. 30 g 1   Facility-Administered Medications Prior to Visit  Medication Dose Route Frequency Provider Last Rate Last Admin   sodium chloride flush (NS) 0.9 % injection 10 mL  10 mL Intravenous Once Orlie Dakin, Tollie Pizza, MD         Per HPI unless specifically indicated in ROS section below Review of Systems  Objective:  BP 120/68   Pulse 78   Temp (!) 97.1 F (36.2 C) (Temporal)   Ht 5\' 9"  (1.753 m)   Wt 145 lb 4 oz (65.9 kg)   SpO2 94%   BMI 21.45 kg/m   Wt Readings from Last 3 Encounters:  07/16/22 145 lb 4 oz (65.9 kg)  07/02/22 147 lb 4 oz (66.8 kg)  06/13/22 148 lb (67.1 kg)      Physical Exam Vitals and nursing note reviewed.  Constitutional:      Appearance: Normal appearance. He is not ill-appearing.  HENT:     Head: Normocephalic and atraumatic.     Mouth/Throat:     Mouth: Mucous membranes are moist.     Pharynx: Oropharynx is clear. No oropharyngeal exudate or posterior oropharyngeal erythema.  Cardiovascular:     Rate and Rhythm: Normal rate and regular rhythm.     Pulses: Normal pulses.     Heart sounds: Normal heart sounds. No murmur heard. Pulmonary:     Effort: Pulmonary effort is normal. No respiratory distress.     Breath sounds: Normal breath sounds. No wheezing, rhonchi or rales.  Abdominal:     General: Bowel sounds are normal. There is no distension.     Palpations: Abdomen is soft. There is no mass.     Tenderness: There is no  abdominal tenderness. There is no right CVA tenderness, left CVA tenderness, guarding or rebound. Negative signs include Murphy's sign.     Hernia: No hernia is present.  Musculoskeletal:     Right lower leg: No edema.     Left lower leg: No edema.  Skin:    General: Skin is warm and dry.     Findings: No rash.  Neurological:     Mental Status: He is alert.  Psychiatric:        Mood and Affect: Mood normal.  Behavior: Behavior normal.       Results for orders placed or performed during the hospital encounter of 07/13/22  Lipase, blood  Result Value Ref Range   Lipase 25 11 - 51 U/L  Comprehensive metabolic panel  Result Value Ref Range   Sodium 137 135 - 145 mmol/L   Potassium 3.8 3.5 - 5.1 mmol/L   Chloride 105 98 - 111 mmol/L   CO2 23 22 - 32 mmol/L   Glucose, Bld 214 (H) 70 - 99 mg/dL   BUN 55 (H) 8 - 23 mg/dL   Creatinine, Ser 2.95 (H) 0.61 - 1.24 mg/dL   Calcium 9.1 8.9 - 62.1 mg/dL   Total Protein 7.0 6.5 - 8.1 g/dL   Albumin 3.9 3.5 - 5.0 g/dL   AST 25 15 - 41 U/L   ALT 19 0 - 44 U/L   Alkaline Phosphatase 84 38 - 126 U/L   Total Bilirubin 0.8 0.3 - 1.2 mg/dL   GFR, Estimated 38 (L) >60 mL/min   Anion gap 9 5 - 15  CBC  Result Value Ref Range   WBC 3.5 (L) 4.0 - 10.5 K/uL   RBC 4.16 (L) 4.22 - 5.81 MIL/uL   Hemoglobin 12.5 (L) 13.0 - 17.0 g/dL   HCT 30.8 (L) 65.7 - 84.6 %   MCV 93.0 80.0 - 100.0 fL   MCH 30.0 26.0 - 34.0 pg   MCHC 32.3 30.0 - 36.0 g/dL   RDW 96.2 95.2 - 84.1 %   Platelets 161 150 - 400 K/uL   nRBC 0.0 0.0 - 0.2 %  Urinalysis, Routine w reflex microscopic -Urine, Clean Catch  Result Value Ref Range   Color, Urine YELLOW YELLOW   APPearance CLEAR (A) CLEAR   Specific Gravity, Urine 1.025 1.005 - 1.030   pH 5.0 5.0 - 8.0   Glucose, UA NEGATIVE NEGATIVE mg/dL   Hgb urine dipstick NEGATIVE NEGATIVE   Bilirubin Urine NEGATIVE NEGATIVE   Ketones, ur TRACE (A) NEGATIVE mg/dL   Protein, ur 324 (A) NEGATIVE mg/dL   Nitrite NEGATIVE  NEGATIVE   Leukocytes,Ua NEGATIVE NEGATIVE   RBC / HPF 0-5 0 - 5 RBC/hpf   WBC, UA 0-5 0 - 5 WBC/hpf   Bacteria, UA RARE (A) NONE SEEN   Squamous Epithelial / HPF 0-5 0 - 5 /HPF   Mucus PRESENT    Hyaline Casts, UA PRESENT   Lactic acid, plasma  Result Value Ref Range   Lactic Acid, Venous 1.5 0.5 - 1.9 mmol/L    Assessment & Plan:   Problem List Items Addressed This Visit     CKD (chronic kidney disease) stage 3, GFR 30-59 ml/min (HCC)   Type 2 diabetes mellitus with diabetic chronic kidney disease (HCC)    Insulin currently on hold while decreased PO intake with current GI illness.       CLL (chronic lymphocytic leukemia) (HCC)   Exocrine pancreatic insufficiency    Continues creon.       Diarrhea of presumed infectious origin - Primary    Nausea/vomiting has resolved but watery diarrhea persists.  Check stool studies r/o infection (C diff and other).  Supportive measures reviewed - continue zofran, imodium PRN, push fluids.  Update if new or worsening symptoms develop      Relevant Orders   C. difficile GDH and Toxin A/B   GI pathogen panel by PCR, stool     No orders of the defined types were placed in this encounter.   Orders  Placed This Encounter  Procedures   GI pathogen panel by PCR, stool    Standing Status:   Future    Standing Expiration Date:   07/16/2023   C. difficile GDH and Toxin A/B    Standing Status:   Future    Standing Expiration Date:   07/16/2023    Patient Instructions  Pass by lab for stool tests.  Continue pushing fluids - small sips of fluids throughout the day, bland diet as tolerated.  May continue zofran and imodium as needed, follow instructions on bottle.  Let us know if fever, abdominal pain, blood in stool develops.   Follow up plan: Return if symptoms worsen or fail to improve.  Eustaquio Boyden, MD

## 2022-07-17 ENCOUNTER — Encounter: Payer: Self-pay | Admitting: Family Medicine

## 2022-07-17 LAB — C. DIFFICILE GDH AND TOXIN A/B
GDH ANTIGEN: DETECTED
MICRO NUMBER:: 14953921

## 2022-07-18 ENCOUNTER — Encounter: Payer: Self-pay | Admitting: Family Medicine

## 2022-07-18 ENCOUNTER — Other Ambulatory Visit: Payer: Self-pay | Admitting: Family Medicine

## 2022-07-18 LAB — GASTROINTESTINAL PATHOGEN PNL
Norovirus GI/GII: NOT DETECTED
Salmonella species: NOT DETECTED
Shiga Toxin 1: NOT DETECTED
Vibrio Group: NOT DETECTED

## 2022-07-18 LAB — C. DIFFICILE GDH AND TOXIN A/B
SPECIMEN QUALITY:: ADEQUATE
TOXIN A AND B: NOT DETECTED

## 2022-07-18 LAB — CLOSTRIDIUM DIFFICILE TOXIN B, QUALITATIVE, REAL-TIME PCR: Toxigenic C. Difficile by PCR: NOT DETECTED

## 2022-07-18 MED ORDER — AZITHROMYCIN 500 MG PO TABS: 500.0000 mg | ORAL_TABLET | Freq: Every day | ORAL | 0 refills | Status: DC

## 2022-07-18 NOTE — Telephone Encounter (Signed)
error 

## 2022-07-19 LAB — GASTROINTESTINAL PATHOGEN PNL
CampyloBacter Group: DETECTED — AB
Rotavirus A: NOT DETECTED
Shiga Toxin 2: NOT DETECTED
Shigella Species: NOT DETECTED
Yersinia enterocolitica: NOT DETECTED

## 2022-07-19 LAB — ISOLATE REFERRAL PH
MICRO NUMBER:: 14965564
SPECIMEN QUALITY:: ADEQUATE

## 2022-07-22 ENCOUNTER — Telehealth: Payer: Self-pay

## 2022-07-22 NOTE — Telephone Encounter (Signed)
See result note. Already addressed.  

## 2022-07-22 NOTE — Telephone Encounter (Signed)
Denine with [facility] called to verify that Dr Reece Agar had received critical report on 07/16/22 Campylobacter detected. Per result note Dr Reece Agar has already addressed this and pt was contacted. Sending note to Dr Reece Agar and G pool.

## 2022-07-28 ENCOUNTER — Other Ambulatory Visit: Payer: Self-pay | Admitting: Family Medicine

## 2022-07-30 ENCOUNTER — Other Ambulatory Visit: Payer: Self-pay | Admitting: Family Medicine

## 2022-07-30 DIAGNOSIS — M545 Low back pain, unspecified: Secondary | ICD-10-CM

## 2022-07-30 NOTE — Telephone Encounter (Signed)
Name of Medication: Tramadol Name of Pharmacy: [facility] Last Fill or Written Date and Quantity: 07/02/22, #20 Last Office Visit and Type: 07/16/22, hosp f/u Next Office Visit and Type: 01/01/23, 6 mo DM/HTN f/u Last Controlled Substance Agreement Date: 09/28/12 Last UDS: 09/28/12

## 2022-07-30 NOTE — Telephone Encounter (Signed)
Too soon. Rx sent 07/02/22, #39/4 to [facility]   Request denied.

## 2022-07-31 NOTE — Telephone Encounter (Signed)
ERx 

## 2022-08-02 ENCOUNTER — Ambulatory Visit: Payer: Medicare Other | Attending: Family Medicine

## 2022-08-02 DIAGNOSIS — I6523 Occlusion and stenosis of bilateral carotid arteries: Secondary | ICD-10-CM | POA: Diagnosis not present

## 2022-08-06 ENCOUNTER — Encounter: Payer: Medicare Other | Admitting: Pharmacist

## 2022-08-09 ENCOUNTER — Telehealth: Payer: Self-pay | Admitting: Family Medicine

## 2022-08-09 ENCOUNTER — Other Ambulatory Visit: Payer: Self-pay

## 2022-08-09 MED ORDER — FREESTYLE LIBRE 2 SENSOR MISC: 1.0000 | 3 refills | Status: DC

## 2022-08-09 NOTE — Telephone Encounter (Signed)
Script sent into mail order called patient to let them know I sent to mail order but patient is not sure where they needed to go. Will check with girlfriend and let us know Monday.

## 2022-08-09 NOTE — Telephone Encounter (Signed)
   Patient contacted the office regarding Continuous Blood Gluc Sensor (FREESTYLE LIBRE 2 SENSOR) MISC . States he receives these through medicare, he called today to have them scheduled to deliver and as told they need approval through Dr. Reece Agar. Was wanting to know if Dr. Reece Agar could go in and approve this so he may have these delivered. Please advise, thank you.

## 2022-08-10 ENCOUNTER — Other Ambulatory Visit: Payer: Self-pay | Admitting: Cardiovascular Disease

## 2022-08-12 NOTE — Telephone Encounter (Signed)
Please contact pt for future appointment. °Pt due for 12 month f/u. °

## 2022-08-13 NOTE — Telephone Encounter (Signed)
Last visit 07/09/2021 plan--You will need a follow up appointment in 12 months Next visit--none

## 2022-08-13 NOTE — Telephone Encounter (Signed)
30 day supply sent to pharmacy requesting pt call for appt (1st attempt)

## 2022-08-15 NOTE — Telephone Encounter (Signed)
Pt scheduled on 6/25

## 2022-08-21 ENCOUNTER — Telehealth: Payer: Self-pay | Admitting: Family Medicine

## 2022-08-21 NOTE — Telephone Encounter (Signed)
Form was dropped off on patient's behalf for Dr. Reece Agar to review. Unsure of the form, person dropping form off would not say what it was. Left in provider's tray in the front office

## 2022-08-21 NOTE — Telephone Encounter (Signed)
Faxed pt's completed application to [employer] (Pt assistance prog) at [phone] for Jardiance 10 mg tab.

## 2022-08-25 NOTE — Progress Notes (Unsigned)
Cardiology Office Note  Date:  08/27/2022   ID:  Scott [name], Scott [name] 01-19-1941, MRN [id number]  PCP:  Eustaquio Boyden, MD   Chief Complaint  Patient presents with   12 month follow up     "Doing well." Medications reviewed by the patient verbally.     HPI:  Scott [name] is a 82 y.o. male with history of  CAD s/p MI s/p remote PTCA (Dx-OM2 proximal concentric stenosis),  Diabetes with peripheral nephropathy,  CKD stage III (sees [facility]),  bradycardia,  CLL,  carotid artery disease by duplex 03/2018 (1-39% RICA, 40-59% LICA),  chronic mild edema, HTN, orthostasis  who presents for routine follow-up of his coronary disease  Last seen by myself in clinic May 2023  Having stomach trouble, campylobacter group May 2024 Was treated, zpak Lost weight, 10 pounds 154 pounds on last clinic visit with myself 1 year ago  down to 145 today, down from 170s years ago  Denies significant chest pain on exertion  Myoview: no significant ischemia, 4/23 Results discussed on today's visit  Followed by Dr. Orlie Dakin for his leukemia, hemoglobin stable  Reports that he lost his wife 6 years ago, has a friend that helps take care of him, helps to cook  Labs reviewed Total chol 156, LDL 77 HGB 12.5 CR 1.78 A1C 7.9  EKG personally reviewed by myself on todays visit EKG Interpretation  Date/Time:  Tuesday August 27 2022 10:21:48 EDT Ventricular Rate:  56 PR Interval:  184 QRS Duration: 94 QT Interval:  476 QTC Calculation: 459 R Axis:   -24 Text Interpretation: Sinus bradycardia with frequent Premature ventricular complexes When compared with ECG of 27-Aug-2022 10:19, No significant change was found Confirmed by Julien Nordmann [phone]) on 08/27/2022 10:48:23 AM     Carotid 05/2020 Carotid US 05/2020: RICA 1-39%, LICA 40-59% stenosis,    Remote PTCA 1994, chronic reocclusion distal right with collaterals from circumflex, and 08/24/1993 PTCA of Dx-OM2 proximal concentric stenosis.   2013  admitted for chest pain, ruled out for ACS and an exercise stress test was negative for ischemia ,  fixed,  apical thinning.  Echo was unremarkable  echocardiogram in 10/2013 showed inferior wall hypokinesis, EF 50-55%, mild mitral regurgitation, mild LAE.   nuclear stress test 11/2015  low risk with mild apical thinning and mild apical ischemia, EF 58 with normal wall motion.  CLL, severely low platelets  beta blocker was previously reduced due to bradycardia.   Aspirin was stopped due to low platelets and frequent nosebleeds.   Last labs 12/2018 showed Hgb 9.0, plt 68 (as low as 33), K 4.1, Cr 1.36, normal AST/ALT, 06/2018 A1C 7.5, TSH elevated but free T4 normal (managed by primary care), 02/2018 LDL 65.     PMH:   has a past medical history of Acquired hand deformity (1962), Anemia (10/11/2011), Arthritis, Bradycardia (10/10/2011), CAD (coronary artery disease) (10/10/2011), Carotid artery disease (HCC), Cellulitis of buttock, left (11/17/2018), Chronic edema, CKD (chronic kidney disease) stage 3, GFR 30-59 ml/min (HCC), CLL (chronic lymphocytic leukemia) (HCC), Colon polyps, Cough (04/28/2019), Dysphagia, Generalized headaches, GI bleed (07/08/2017), Glaucoma, HLD (hyperlipidemia), Hyperkalemia (02/28/2018), Hypertension, Hypothyroidism (10/10/2011), Iron deficiency anemia due to chronic blood loss, Squamous cell carcinoma of skin (06/15/2019), Sternal fracture (09/13/2017), Thrombocytopenia (HCC) (10/11/2011), and Type 2 diabetes with nephropathy (HCC).  PSH:    Past Surgical History:  Procedure Laterality Date   BACK SURGERY     cervical neck   CATARACT EXTRACTION, BILATERAL Bilateral 2017   COLONOSCOPY  11/2008   1 polyp, diverticulosis, rec rpt 5 yrs (Dr. Randa Evens, Deboraha Sprang)   COLONOSCOPY  06/2014   hyperplastic polyp, rpt 5 yrs Randa Evens)   COLONOSCOPY WITH PROPOFOL N/A 11/17/2017   TA, HP, (Vanga, Loel Dubonnet, MD)   ESOPHAGOGASTRODUODENOSCOPY (EGD) WITH PROPOFOL N/A 11/17/2017    healing erosive gastritis, intestinal metaplasia, neg H pylori (Vanga, Loel Dubonnet, MD)   ESOPHAGOGASTRODUODENOSCOPY (EGD) WITH PROPOFOL N/A 06/28/2019   erosive gastropathy with stigmata of recent bleed, granular tissue in esophagus biopsied- reflux esophagitis, small HH (Vanga, Loel Dubonnet, MD)   EYE SURGERY  2012   laser surgery for glaucoma   PORTACATH PLACEMENT Left 12/09/2018   Procedure: INSERTION PORT-A-CATH;  Surgeon: Henrene Dodge, MD;  Location: [facility] ORS;  Service: General;  Laterality: Left;   PTCA  1994, 1995   US ECHOCARDIOGRAPHY  10/2013   inferior wall hypokinesis, mild LVH, EF 50-55%, mild MR and LA dilation    Current Outpatient Medications  Medication Sig Dispense Refill   acetaminophen (TYLENOL) 650 MG CR tablet Take 1,300 mg by mouth 2 (two) times daily.     amLODipine (NORVASC) 10 MG tablet Take 1 tablet (10 mg total) by mouth daily. 90 tablet 4   atorvastatin (LIPITOR) 80 MG tablet TAKE 1 TABLET BY MOUTH ON MONDAY, WEDNESDAY AND FRIDAY 39 tablet 4   carvedilol (COREG) 6.25 MG tablet Take 1 tablet (6.25 mg total) by mouth 2 (two) times daily with a meal. 180 tablet 4   Continuous Glucose Sensor (FREESTYLE LIBRE 2 SENSOR) MISC 1 each by Does not apply route every 14 (fourteen) days. 6 each 3   empagliflozin (JARDIANCE) 10 MG TABS tablet Take 1 tablet (10 mg total) by mouth daily before breakfast. 30 tablet 6   ezetimibe (ZETIA) 10 MG tablet Take 1 tablet (10 mg total) by mouth daily. PLEASE CALL OFFICE TO SCHEDULE APPOINTMENT PRIOR TO NEXT REFILL 30 tablet 0   Gabapentin 10 % CREA Apply 1 Application topically daily in the afternoon. 30 g 6   hydrochlorothiazide (MICROZIDE) 12.5 MG capsule Take 1 capsule (12.5 mg total) by mouth daily. 90 capsule 4   insulin glargine (LANTUS) 100 UNIT/ML Solostar Pen Inject 10 Units into the skin daily. 15 mL 3   Insulin Pen Needle 32G X 4 MM MISC 1 Device by Does not apply route in the morning, at noon, in the evening, and at bedtime. 400  each 3   levothyroxine (SYNTHROID) 88 MCG tablet Take 1 tablet (88 mcg total) by mouth daily. 90 tablet 3   lipase/protease/amylase (CREON) 36000 UNITS CPEP capsule Take 2 capsules with the first bite of each meal and 1 capsule with the first bite of each snack 240 capsule 11   meclizine (ANTIVERT) 12.5 MG tablet TAKE 1 TABLET BY MOUTH TWICE DAILY AS NEEDED FOR  DIZZINESS  (SEDATION  PRECAUTIONS) 20 tablet 0   nitroGLYCERIN (NITROSTAT) 0.4 MG SL tablet Place 1 tablet (0.4 mg total) under the tongue every 5 (five) minutes as needed for chest pain ((MAX of 3 doses)). 25 tablet 0   NOVOLOG FLEXPEN 100 UNIT/ML FlexPen Max daily 20 units per scale 15 mL 6   ondansetron (ZOFRAN-ODT) 4 MG disintegrating tablet Take 1 tablet (4 mg total) by mouth every 8 (eight) hours as needed for nausea or vomiting. 12 tablet 0   oxymetazoline (AFRIN) 0.05 % nasal spray Place 1 spray into left nostril daily as needed (nose bleeds).     traMADol (ULTRAM) 50 MG tablet TAKE 1 TABLET BY  MOUTH EVERY 8 HOURS AS NEEDED 20 tablet 0   traZODone (DESYREL) 100 MG tablet Take 1 tablet (100 mg total) by mouth at bedtime as needed. for sleep 90 tablet 4   triamcinolone ointment (KENALOG) 0.5 % Apply 1 application topically 2 (two) times daily. 30 g 1   No current facility-administered medications for this visit.   Facility-Administered Medications Ordered in Other Visits  Medication Dose Route Frequency Provider Last Rate Last Admin   sodium chloride flush (NS) 0.9 % injection 10 mL  10 mL Intravenous Once Jeralyn Ruths, MD        Allergies:   Lisinopril and Prednisone   Social History:  The patient  reports that he quit smoking about 44 years ago. His smoking use included cigarettes. He has been exposed to tobacco smoke. He has never used smokeless tobacco. He reports that he does not drink alcohol and does not use drugs.   Family History:   family history includes Coronary artery disease (age of onset: 12) in his son;  Diabetes in his sister; Hyperlipidemia in his sister; Other in his brother; Pneumonia in his father; Stomach cancer (age of onset: 26) in his sister.    Review of Systems: Review of Systems  Constitutional: Negative.   HENT: Negative.    Respiratory: Negative.    Cardiovascular: Negative.   Gastrointestinal: Negative.   Musculoskeletal: Negative.   Neurological: Negative.   Psychiatric/Behavioral: Negative.    All other systems reviewed and are negative.   PHYSICAL EXAM: VS:  BP 130/68 (BP Location: Left Arm, Patient Position: Sitting, Cuff Size: Normal)   Pulse (!) 56   Ht 5\' 11"  (1.803 m)   Wt 145 lb 2 oz (65.8 kg)   BMI 20.24 kg/m  , BMI Body mass index is 20.24 kg/m. Constitutional:  oriented to person, place, and time. No distress.  HENT:  Head: Grossly normal Eyes:  no discharge. No scleral icterus.  Neck: No JVD, no carotid bruits  Cardiovascular: Regular rate and rhythm, no murmurs appreciated Pulmonary/Chest: Clear to auscultation bilaterally, no wheezes or rails Abdominal: Soft.  no distension.  no tenderness.  Musculoskeletal: Normal range of motion Neurological:  normal muscle tone. Coordination normal. No atrophy Skin: Skin warm and dry Psychiatric: normal affect, pleasant   Recent Labs: 06/25/2022: TSH 2.03 07/13/2022: ALT 19; BUN 55; Creatinine, Ser 1.78; Hemoglobin 12.5; Platelets 161; Potassium 3.8; Sodium 137    Lipid Panel Lab Results  Component Value Date   CHOL 156 06/25/2022   HDL 43.50 06/25/2022   LDLCALC 77 06/25/2022   TRIG 178.0 (H) 06/25/2022      Wt Readings from Last 3 Encounters:  08/27/22 145 lb 2 oz (65.8 kg)  08/26/22 143 lb (64.9 kg)  07/16/22 145 lb 4 oz (65.9 kg)     ASSESSMENT AND PLAN:  Problem List Items Addressed This Visit       Cardiology Problems   Carotid stenosis, asymptomatic, bilateral   CAD (coronary artery disease) - Primary   Relevant Orders   EKG 12-Lead (Completed)   Hypertension   Relevant Orders    EKG 12-Lead (Completed)     Other   Type 2 diabetes mellitus with diabetic chronic kidney disease (HCC)   CKD (chronic kidney disease) stage 3, GFR 30-59 ml/min (HCC)   Other Visit Diagnoses     PAD (peripheral artery disease) (HCC)         CAD with stable angina Currently with no symptoms of angina. No further workup at  this time. Continue current medication regimen. Not on asa, had ulcer/gi bleed, history of low platelets, nosebleeds Stress test no significant ischemia No further testing needed at this time  PAD Carotid US 05/2021 - RICA 1-39%, LICA 40-59% stenosis Cholesterol at goal Continue Zetia and lipitor  Fatigue Recommend he try to avoid overexerting himself in the heat in the garden He reports symptoms relatively well-controlled  Essential hypertension Will hold hydrochlorothiazide in setting of chronic renal dysfuntion,weight loss  CLL Hemoglobin stable Followed by oncology/Dr. Orlie Dakin    Total encounter time more than 40 minutes  Greater than 50% was spent in counseling and coordination of care with the patient    Signed, Dossie Arbour, M.D., Ph.D. [facility] [place], [place] [phone]

## 2022-08-26 ENCOUNTER — Encounter: Payer: Self-pay | Admitting: Internal Medicine

## 2022-08-26 ENCOUNTER — Ambulatory Visit (INDEPENDENT_AMBULATORY_CARE_PROVIDER_SITE_OTHER): Payer: Medicare Other | Admitting: Internal Medicine

## 2022-08-26 VITALS — BP 122/70 | HR 64 | Ht 69.0 in | Wt 143.0 lb

## 2022-08-26 DIAGNOSIS — E1142 Type 2 diabetes mellitus with diabetic polyneuropathy: Secondary | ICD-10-CM | POA: Diagnosis not present

## 2022-08-26 DIAGNOSIS — Z794 Long term (current) use of insulin: Secondary | ICD-10-CM

## 2022-08-26 DIAGNOSIS — E1122 Type 2 diabetes mellitus with diabetic chronic kidney disease: Secondary | ICD-10-CM

## 2022-08-26 DIAGNOSIS — N1832 Chronic kidney disease, stage 3b: Secondary | ICD-10-CM | POA: Diagnosis not present

## 2022-08-26 DIAGNOSIS — E1165 Type 2 diabetes mellitus with hyperglycemia: Secondary | ICD-10-CM

## 2022-08-26 MED ORDER — GABAPENTIN 10 % EX CREA: 1.0000 | TOPICAL_CREAM | Freq: Every day | CUTANEOUS | 6 refills | Status: DC

## 2022-08-26 MED ORDER — INSULIN GLARGINE 100 UNIT/ML SOLOSTAR PEN: 10.0000 [IU] | PEN_INJECTOR | Freq: Every day | SUBCUTANEOUS | 3 refills | Status: DC

## 2022-08-26 NOTE — Progress Notes (Signed)
Name: Scott [name]  MRN/ DOB: [id number], 1940-08-03   Age/ Sex: 82 y.o., male    PCP: Eustaquio Boyden, MD   Reason for Endocrinology Evaluation: Type 2 Diabetes Mellitus     Date of Initial Endocrinology Visit: 09/27/2021    PATIENT IDENTIFIER: Scott [name] is a 82 y.o. male with a past medical history of T2DM, CLL and hypothyroidism. The patient presented for initial endocrinology clinic visit on 09/27/2021 for consultative assistance with his diabetes management.    HPI: Scott [name] was    Diagnosed with DM 1990 Prior Medications tried/Intolerance: metformin , glipizide  Hemoglobin A1c has ranged from 6.9% in 2019, peaking at 8.6% in 2023.    Patient with pancreatic insufficiency and is on Creon Patient follows with cardiology for CAD He also follows with oncology for leukemia  On his initial visit to our clinic he had an A1c of 7.8%, he was already on MDI regimen which we adjusted, he was provided with a correction scale   He was started on Jardiance through PCPs office 06/2022  SUBJECTIVE:   During the last visit (02/15/2022): A1c 7.7%     Today (08/26/22): Scott [name] is here for a follow up on diabetes management. He checks his  blood sugars multiple times daily. The patient has not had hypoglycemic episodes since the last clinic visit.   He was seen by GI 10/02/2021 for exocrine pancreatic insufficiency He continues to follow-up with oncology for CLL , he is s/p removal of the left subclavian porta-catheter 06/2022.  No treatment required  He was evaluated by his PCP in May/2024 for diarrhea due to presumed infectious origin  His PCP attempted to prescribe Jardiance/2024 but it was close prohibitive, patient submitted PA forms recently to PCP's office   Diarrhea has resolved  Denies nausea or vomiting   Has discomfort at his great toe, keeps him at night at times with stiffness at times   HOME DIABETES REGIMEN: Lantus  12 units daily  NovoLog 5 units  with breakfast, 4 units with Supper -has been taking 2 units Correction factor: NovoLog (BG -140/60)    CONTINUOUS GLUCOSE MONITORING RECORD INTERPRETATION    Dates of Recording: 6/11-6/24/2024  Sensor description:freestyle libre  Results statistics:   CGM use % of time 98  Average and SD 197/27.9  Time in range  42 %  % Time Above 180 40  % Time above 250 18  % Time Below target 0    Glycemic patterns summary:Bg's trend down at night but trend up during the day   Hyperglycemic episodes  post-prandial  Hypoglycemic episodes occurred n/a  Overnight : BG's start  high but trend down      DIABETIC COMPLICATIONS: Microvascular complications:  CKD III, neuropathy Denies: retinopathy Last eye exam: Completed 09/2021  Macrovascular complications:  CAD Denies: PVD, CVA   PAST HISTORY: Past Medical History:  Past Medical History:  Diagnosis Date   Acquired hand deformity 1962   hand saw accident at work   Anemia 10/11/2011   Arthritis    Bradycardia 10/10/2011   CAD (coronary artery disease) 10/10/2011   MI s/p PTCA (Dx-OM2 proximal concentric stenosis)   Carotid artery disease (HCC)    Cellulitis of buttock, left 11/17/2018   Chronic edema    CKD (chronic kidney disease) stage 3, GFR 30-59 ml/min (HCC)    [name]   CLL (chronic lymphocytic leukemia) (HCC)    Colon polyps    Cough 04/28/2019   Dysphagia  EGD - reflux esophagitis, one area of stomach with focal intestinal metaplasia (06/2019) Dr Allegra Lai   Generalized headaches    frequent   GI bleed 07/08/2017   Glaucoma    s/p laser surgery   HLD (hyperlipidemia)    Hyperkalemia 02/28/2018   Hypertension    Hypothyroidism 10/10/2011   Iron deficiency anemia due to chronic blood loss    Squamous cell carcinoma of skin 06/15/2019   Right posterior ear. SCCis, hypertrophic, crusted   Sternal fracture 09/13/2017   Thrombocytopenia (HCC) 10/11/2011   Type 2 diabetes with nephropathy [facility])    DM  refresher course [facility] (04/2013)   Past Surgical History:  Past Surgical History:  Procedure Laterality Date   BACK SURGERY     cervical neck   CATARACT EXTRACTION, BILATERAL Bilateral 2017   COLONOSCOPY  11/2008   1 polyp, diverticulosis, rec rpt 5 yrs (Dr. Randa Evens, [facility] Sprang)   COLONOSCOPY  06/2014   hyperplastic polyp, rpt 5 yrs Randa Evens)   COLONOSCOPY WITH PROPOFOL N/A 11/17/2017   TA, HP, (Vanga, Loel Dubonnet, MD)   ESOPHAGOGASTRODUODENOSCOPY (EGD) WITH PROPOFOL N/A 11/17/2017   healing erosive gastritis, intestinal metaplasia, neg H pylori (Vanga, Loel Dubonnet, MD)   ESOPHAGOGASTRODUODENOSCOPY (EGD) WITH PROPOFOL N/A 06/28/2019   erosive gastropathy with stigmata of recent bleed, granular tissue in esophagus biopsied- reflux esophagitis, small HH (Vanga, Loel Dubonnet, MD)   EYE SURGERY  2012   laser surgery for glaucoma   PORTACATH PLACEMENT Left 12/09/2018   Procedure: INSERTION PORT-A-CATH;  Surgeon: Henrene Dodge, MD;  Location: [facility] ORS;  Service: General;  Laterality: Left;   PTCA  1994, 1995   US ECHOCARDIOGRAPHY  10/2013   inferior wall hypokinesis, mild LVH, EF 50-55%, mild MR and LA dilation    Social History:  reports that he quit smoking about 44 years ago. His smoking use included cigarettes. He has been exposed to tobacco smoke. He has never used smokeless tobacco. He reports that he does not drink alcohol and does not use drugs. Family History:  Family History  Problem Relation Age of Onset   Diabetes Sister    Stomach cancer Sister 43   Pneumonia Father        caused death   Other Brother        no communication with brother so unsure of any health conditions   Coronary artery disease Son 78       5v CABG and stents   Hyperlipidemia Sister    Stroke Neg Hx    Heart attack Neg Hx      HOME MEDICATIONS: Allergies as of 08/26/2022       Reactions   Lisinopril Other (See Comments)   Hyperkalemia even at 5mg  dose   Prednisone Other (See Comments)   Marked  symptomatic hyperglycemia        Medication List        Accurate as of August 26, 2022  9:58 AM. If you have any questions, ask your nurse or doctor.          acetaminophen 650 MG CR tablet Commonly known as: TYLENOL Take 1,300 mg by mouth 2 (two) times daily.   amLODipine 10 MG tablet Commonly known as: NORVASC Take 1 tablet (10 mg total) by mouth daily.   atorvastatin 80 MG tablet Commonly known as: LIPITOR TAKE 1 TABLET BY MOUTH ON MONDAY, WEDNESDAY AND FRIDAY   azithromycin 500 MG tablet Commonly known as: Zithromax Take 1 tablet (500 mg total) by mouth daily.  carvedilol 6.25 MG tablet Commonly known as: COREG Take 1 tablet (6.25 mg total) by mouth 2 (two) times daily with a meal.   empagliflozin 10 MG Tabs tablet Commonly known as: Jardiance Take 1 tablet (10 mg total) by mouth daily before breakfast.   ezetimibe 10 MG tablet Commonly known as: ZETIA Take 1 tablet (10 mg total) by mouth daily. PLEASE CALL OFFICE TO SCHEDULE APPOINTMENT PRIOR TO NEXT REFILL   FreeStyle Libre 2 Sensor Misc 1 each by Does not apply route every 14 (fourteen) days.   hydrochlorothiazide 12.5 MG capsule Commonly known as: MICROZIDE Take 1 capsule (12.5 mg total) by mouth daily.   insulin glargine 100 UNIT/ML Solostar Pen Commonly known as: LANTUS Inject 12 Units into the skin daily.   Insulin Pen Needle 32G X 4 MM Misc 1 Device by Does not apply route in the morning, at noon, in the evening, and at bedtime.   levothyroxine 88 MCG tablet Commonly known as: SYNTHROID Take 1 tablet (88 mcg total) by mouth daily.   lipase/protease/amylase 65784 UNITS Cpep capsule Commonly known as: Creon Take 2 capsules with the first bite of each meal and 1 capsule with the first bite of each snack   meclizine 12.5 MG tablet Commonly known as: ANTIVERT TAKE 1 TABLET BY MOUTH TWICE DAILY AS NEEDED FOR  DIZZINESS  (SEDATION  PRECAUTIONS)   nitroGLYCERIN 0.4 MG SL tablet Commonly known  as: NITROSTAT Place 1 tablet (0.4 mg total) under the tongue every 5 (five) minutes as needed for chest pain ((MAX of 3 doses)).   NovoLOG FlexPen 100 UNIT/ML FlexPen Generic drug: insulin aspart Max daily 20 units per scale   ondansetron 4 MG disintegrating tablet Commonly known as: ZOFRAN-ODT Take 1 tablet (4 mg total) by mouth every 8 (eight) hours as needed for nausea or vomiting.   oxymetazoline 0.05 % nasal spray Commonly known as: AFRIN Place 1 spray into left nostril daily as needed (nose bleeds).   traMADol 50 MG tablet Commonly known as: ULTRAM TAKE 1 TABLET BY MOUTH EVERY 8 HOURS AS NEEDED   traZODone 100 MG tablet Commonly known as: DESYREL Take 1 tablet (100 mg total) by mouth at bedtime as needed. for sleep   triamcinolone ointment 0.5 % Commonly known as: KENALOG Apply 1 application topically 2 (two) times daily.         ALLERGIES: Allergies  Allergen Reactions   Lisinopril Other (See Comments)    Hyperkalemia even at 5mg  dose   Prednisone Other (See Comments)    Marked symptomatic hyperglycemia     REVIEW OF SYSTEMS: A comprehensive ROS was conducted with the patient and is negative except as per HPI     OBJECTIVE:   VITAL SIGNS: BP 122/70 (BP Location: Left Arm, Patient Position: Sitting, Cuff Size: Small)   Pulse 64   Ht 5\' 9"  (1.753 m)   Wt 143 lb (64.9 kg)   SpO2 95%   BMI 21.12 kg/m    PHYSICAL EXAM:  General: Pt appears well and is in NAD  Lungs: Clear with good BS bilat with no rales, rhonchi, or wheezes  Heart: RRR  Extremities:  Lower extremities - No pretibial edema. No lesions.  Neuro: MS is good with appropriate affect, pt is alert and Ox3    DM foot exam: 08/26/2022  The skin of the feet is intact without sores or ulcerations. The pedal pulses are 1+ on right and 1+ on left. The sensation is decreased to a screening 5.07, 10  gram monofilament bilaterally at the heels    DATA REVIEWED:  Lab Results  Component Value  Date   HGBA1C 7.9 (H) 06/25/2022   HGBA1C 7.7 (A) 02/15/2022   HGBA1C 7.8 (A) 12/10/2021    Latest Reference Range & Units 07/13/22 11:22  Sodium 135 - 145 mmol/L 137  Potassium 3.5 - 5.1 mmol/L 3.8  Chloride 98 - 111 mmol/L 105  CO2 22 - 32 mmol/L 23  Glucose 70 - 99 mg/dL 956 (H)  BUN 8 - 23 mg/dL 55 (H)  Creatinine 2.13 - 1.24 mg/dL 0.86 (H)  Calcium 8.9 - 10.3 mg/dL 9.1  Anion gap 5 - 15  9  Alkaline Phosphatase 38 - 126 U/L 84  Albumin 3.5 - 5.0 g/dL 3.9  Lipase 11 - 51 U/L 25  AST 15 - 41 U/L 25  ALT 0 - 44 U/L 19  Total Protein 6.5 - 8.1 g/dL 7.0  Total Bilirubin 0.3 - 1.2 mg/dL 0.8  GFR, Estimated >57 mL/min 38 (L)    ASSESSMENT / PLAN / RECOMMENDATIONS:   1) Type 2 Diabetes Mellitus, Sub-Optimally controlled, With CKD III , neuropathic and macrovascular complications - Most recent A1c of 7.9 %. Goal A1c < 7.5 %.    -Diabetes is felt to be pancreatic in origin -Patient has severe fear of hypoglycemia, he will not go to bed without a BG of 200 mg/DL, this makes it difficult to optimize his glycemic control -I have recommended reducing his basal insulin, patient advised that he will need to scale down on bedtime snacks, otherwise his fasting BG's will get worse with less Lantus if he continues to eat same thing at night -During episode of diarrhea and low oral intake he had reduced his NovoLog to 2 units with meals, I have advised the patient to increase NovoLog as below  MEDICATIONS: Decrease Lantus 10 units daily Increase NovoLog 3-4 units with breakfast and 3-4 units with supper Continue correction factor: NovoLog (BG -140/60)  EDUCATION / INSTRUCTIONS: BG monitoring instructions: Patient is instructed to check his blood sugars 3 times a day, before meals. Call [facility] if: BG persistently < 70  I reviewed the Rule of 15 for the treatment of hypoglycemia in detail with the patient. Literature supplied.   2) Diabetic complications:  Eye: Does  not have known diabetic retinopathy.  Neuro/ Feet: Does  have known diabetic peripheral neuropathy. Renal: Patient does  have known baseline CKD. He is not on an ACEI/ARB at present.patient is intolerant to lisinopril    3) Peripheral neuropathy:  -Patient with bothersome symptoms at night -Will start gabapentin cream to be used at bedtime   Medication Gabapentin cream nightly  Follow-up in 3 months   Signed electronically by: Lyndle Herrlich, MD  [facility] Endocrinology  [facility] [address] [place] [place], [place] [address] Phone: [phone] FAX: [phone]   CC: Eustaquio Boyden, MD [address] [place] [address] Phone: [phone]  Fax: [phone]    Return to Endocrinology clinic as below: Future Appointments  Date Time Provider Department Center  08/26/2022 10:10 AM Lonisha Karthik, Konrad Dolores, MD [facility] None  08/27/2022 10:00 AM Antonieta Iba, MD [facility] None  11/21/2022 10:00 AM [facility] None  11/21/2022 10:15 AM Jeralyn Ruths, MD [facility] None  01/01/2023 11:00 AM Eustaquio Boyden, MD [facility]  05/26/2023 11:30 AM [facility] ANNUAL WELLNESS VISIT 1 [facility]

## 2022-08-26 NOTE — Patient Instructions (Signed)
Decrease  Lantus 10 units daily  Novolog 3-4 units with Breakfast and 3-4 units with Supper    Novolog correctional insulin: ADD extra units on insulin to your meal-time Novolog dose if your blood sugars are higher than 200. Use the scale below to help guide you:   Blood sugar before meal Number of units to inject  Less than 200 0 unit  201 -  260 1 units  261 -  320 2 units  321 -  380 3 units  381 -  440 4 units   HOW TO TREAT LOW BLOOD SUGARS (Blood sugar LESS THAN 70 MG/DL) Please follow the RULE OF 15 for the treatment of hypoglycemia treatment (when your (blood sugars are less than 70 mg/dL)   STEP 1: Take 15 grams of carbohydrates when your blood sugar is low, which includes:  3-4 GLUCOSE TABS  OR 3-4 OZ OF JUICE OR REGULAR SODA OR ONE TUBE OF GLUCOSE GEL    STEP 2: RECHECK blood sugar in 15 MINUTES STEP 3: If your blood sugar is still low at the 15 minute recheck --> then, go back to STEP 1 and treat AGAIN with another 15 grams of carbohydrates.

## 2022-08-27 ENCOUNTER — Encounter: Payer: Self-pay | Admitting: Cardiovascular Disease

## 2022-08-27 ENCOUNTER — Ambulatory Visit: Payer: Medicare Other | Attending: Cardiovascular Disease | Admitting: Cardiovascular Disease

## 2022-08-27 VITALS — BP 130/68 | HR 56 | Ht 71.0 in | Wt 145.1 lb

## 2022-08-27 DIAGNOSIS — N1832 Chronic kidney disease, stage 3b: Secondary | ICD-10-CM | POA: Insufficient documentation

## 2022-08-27 DIAGNOSIS — Z794 Long term (current) use of insulin: Secondary | ICD-10-CM | POA: Diagnosis not present

## 2022-08-27 DIAGNOSIS — E1122 Type 2 diabetes mellitus with diabetic chronic kidney disease: Secondary | ICD-10-CM | POA: Insufficient documentation

## 2022-08-27 DIAGNOSIS — I6523 Occlusion and stenosis of bilateral carotid arteries: Secondary | ICD-10-CM | POA: Insufficient documentation

## 2022-08-27 DIAGNOSIS — I739 Peripheral vascular disease, unspecified: Secondary | ICD-10-CM | POA: Insufficient documentation

## 2022-08-27 DIAGNOSIS — I25118 Atherosclerotic heart disease of native coronary artery with other forms of angina pectoris: Secondary | ICD-10-CM | POA: Diagnosis not present

## 2022-08-27 DIAGNOSIS — I10 Essential (primary) hypertension: Secondary | ICD-10-CM | POA: Insufficient documentation

## 2022-08-27 NOTE — Patient Instructions (Addendum)
Medication Instructions:  Please stop the hydrochlorothiazide (HCTZ) Monitor blood pressure  If you need a refill on your cardiac medications before your next appointment, please call your pharmacy.   Lab work: No new labs needed  Testing/Procedures: No new testing needed  Follow-Up: At [facility], you and your health needs are our priority.  As part of our continuing mission to provide you with exceptional heart care, we have created designated Provider Care Teams.  These Care Teams include your primary Cardiologist (physician) and Advanced Practice Providers (APPs -  Physician Assistants and Nurse Practitioners) who all work together to provide you with the care you need, when you need it.  You will need a follow up appointment in 12 months  Providers on your designated Care Team:   Nicolasa Ducking, NP Eula Listen, PA-C Cadence Fransico Michael, [place]  COVID-19 Vaccine Information can be found at: [email or web address] For questions related to vaccine distribution or appointments, please email [email or web address] or call [phone].

## 2022-09-06 NOTE — Progress Notes (Signed)
Order(s) created erroneously. Erroneous order ID: [id number]  Order moved by: Ian Malkin  Order move date/time: 09/06/2022 12:18 PM  Source Patient: [id number]  Source Contact: 08/27/2022  Destination Patient: [id number]  Destination Contact: 08/14/2022

## 2022-09-09 ENCOUNTER — Other Ambulatory Visit: Payer: Self-pay | Admitting: Cardiovascular Disease

## 2022-09-18 DIAGNOSIS — H401132 Primary open-angle glaucoma, bilateral, moderate stage: Secondary | ICD-10-CM | POA: Diagnosis not present

## 2022-09-18 DIAGNOSIS — H43822 Vitreomacular adhesion, left eye: Secondary | ICD-10-CM | POA: Diagnosis not present

## 2022-09-18 DIAGNOSIS — H04123 Dry eye syndrome of bilateral lacrimal glands: Secondary | ICD-10-CM | POA: Diagnosis not present

## 2022-09-18 DIAGNOSIS — Z961 Presence of intraocular lens: Secondary | ICD-10-CM | POA: Diagnosis not present

## 2022-09-24 ENCOUNTER — Ambulatory Visit (INDEPENDENT_AMBULATORY_CARE_PROVIDER_SITE_OTHER): Payer: Medicare Other | Admitting: Family Medicine

## 2022-09-24 ENCOUNTER — Encounter: Payer: Self-pay | Admitting: Family Medicine

## 2022-09-24 VITALS — BP 132/64 | HR 62 | Temp 97.5°F | Ht 71.0 in | Wt 143.2 lb

## 2022-09-24 DIAGNOSIS — H6192 Disorder of left external ear, unspecified: Secondary | ICD-10-CM

## 2022-09-24 DIAGNOSIS — L570 Actinic keratosis: Secondary | ICD-10-CM

## 2022-09-24 DIAGNOSIS — I10 Essential (primary) hypertension: Secondary | ICD-10-CM | POA: Diagnosis not present

## 2022-09-24 DIAGNOSIS — C44229 Squamous cell carcinoma of skin of left ear and external auricular canal: Secondary | ICD-10-CM | POA: Insufficient documentation

## 2022-09-24 NOTE — Assessment & Plan Note (Signed)
BP staying well controlled off hydrochlorothiazide.  Continue other meds including carvedilol and amlodipine.

## 2022-09-24 NOTE — Patient Instructions (Addendum)
We will refer you back to Dr Marca Ancona office - [facility].  Continue sunscreen use regularly, wide brimmed hat when exposed to sun.

## 2022-09-24 NOTE — Assessment & Plan Note (Signed)
Concern for cancerous growth to left external ear at helix.  Refer to dermatology for evaluation. Has previously seen Dr Roseanne Reno

## 2022-09-24 NOTE — Assessment & Plan Note (Signed)
Refer back to derm for AKs to face.

## 2022-09-24 NOTE — Progress Notes (Signed)
Ph: [phone] Fax: [phone]   Patient ID: Scott [name], male    DOB: 11/09/1940, 82 y.o.   MRN: [id number]  This visit was conducted in person.  BP 132/64   Pulse 62   Temp (!) 97.5 F (36.4 C) (Temporal)   Ht 5\' 11"  (1.803 m)   Wt 143 lb 4 oz (65 kg)   SpO2 97%   BMI 19.98 kg/m   BP Readings from Last 3 Encounters:  09/24/22 132/64  08/27/22 130/68  08/26/22 122/70    CC: check skin spots Subjective:   HPI: Scott [name] is a 82 y.o. male presenting on 09/24/2022 for Referral and Skin Problem (Pt concerned about new "spots" on skin. Wants to discuss new derm referral. )   New skin spots noted to bilateral cheeks near ears, as well as growth to left ear pinna - present for about 1 year. He's previously seen dermatologist and had spots removed - but when he went to call for f/u appt was told needed new referral. Has previously seen Dr Roseanne Reno, last seen 01/2020.  Spots overall asymptomatic.  He's been using lotion to skin.   Saw cardiology Mariah Milling) last month - hydrochlorothiazide 12.5mg  stopped - BP staying well controlled.   DM followed by endo [facility]).  Lab Results  Component Value Date   HGBA1C 7.9 (H) 06/25/2022       Relevant past medical, surgical, family and social history reviewed and updated as indicated. Interim medical history since our last visit reviewed. Allergies and medications reviewed and updated. Outpatient Medications Prior to Visit  Medication Sig Dispense Refill   acetaminophen (TYLENOL) 650 MG CR tablet Take 1,300 mg by mouth 2 (two) times daily.     amLODipine (NORVASC) 10 MG tablet Take 1 tablet (10 mg total) by mouth daily. 90 tablet 4   atorvastatin (LIPITOR) 80 MG tablet TAKE 1 TABLET BY MOUTH ON MONDAY, WEDNESDAY AND FRIDAY 39 tablet 4   carvedilol (COREG) 6.25 MG tablet Take 1 tablet (6.25 mg total) by mouth 2 (two) times daily with a meal. 180 tablet 4   Continuous Glucose Sensor (FREESTYLE LIBRE 2 SENSOR) MISC 1 each by  Does not apply route every 14 (fourteen) days. 6 each 3   empagliflozin (JARDIANCE) 10 MG TABS tablet Take 1 tablet (10 mg total) by mouth daily before breakfast. 30 tablet 6   ezetimibe (ZETIA) 10 MG tablet Take 1 tablet (10 mg total) by mouth daily. 30 tablet 11   Gabapentin 10 % CREA Apply 1 Application topically daily in the afternoon. 30 g 6   insulin glargine (LANTUS) 100 UNIT/ML Solostar Pen Inject 10 Units into the skin daily. 15 mL 3   Insulin Pen Needle 32G X 4 MM MISC 1 Device by Does not apply route in the morning, at noon, in the evening, and at bedtime. 400 each 3   levothyroxine (SYNTHROID) 88 MCG tablet Take 1 tablet (88 mcg total) by mouth daily. 90 tablet 3   lipase/protease/amylase (CREON) 36000 UNITS CPEP capsule Take 2 capsules with the first bite of each meal and 1 capsule with the first bite of each snack 240 capsule 11   meclizine (ANTIVERT) 12.5 MG tablet TAKE 1 TABLET BY MOUTH TWICE DAILY AS NEEDED FOR  DIZZINESS  (SEDATION  PRECAUTIONS) 20 tablet 0   nitroGLYCERIN (NITROSTAT) 0.4 MG SL tablet Place 1 tablet (0.4 mg total) under the tongue every 5 (five) minutes as needed for chest pain ((MAX of 3 doses)). 25  tablet 0   NOVOLOG FLEXPEN 100 UNIT/ML FlexPen Max daily 20 units per scale 15 mL 6   ondansetron (ZOFRAN-ODT) 4 MG disintegrating tablet Take 1 tablet (4 mg total) by mouth every 8 (eight) hours as needed for nausea or vomiting. 12 tablet 0   oxymetazoline (AFRIN) 0.05 % nasal spray Place 1 spray into left nostril daily as needed (nose bleeds).     traMADol (ULTRAM) 50 MG tablet TAKE 1 TABLET BY MOUTH EVERY 8 HOURS AS NEEDED 20 tablet 0   traZODone (DESYREL) 100 MG tablet Take 1 tablet (100 mg total) by mouth at bedtime as needed. for sleep 90 tablet 4   triamcinolone ointment (KENALOG) 0.5 % Apply 1 application topically 2 (two) times daily. 30 g 1   Facility-Administered Medications Prior to Visit  Medication Dose Route Frequency Provider Last Rate Last Admin    sodium chloride flush (NS) 0.9 % injection 10 mL  10 mL Intravenous Once Orlie Dakin, Tollie Pizza, MD         Per HPI unless specifically indicated in ROS section below Review of Systems  Objective:  BP 132/64   Pulse 62   Temp (!) 97.5 F (36.4 C) (Temporal)   Ht 5\' 11"  (1.803 m)   Wt 143 lb 4 oz (65 kg)   SpO2 97%   BMI 19.98 kg/m   Wt Readings from Last 3 Encounters:  09/24/22 143 lb 4 oz (65 kg)  08/27/22 145 lb 2 oz (65.8 kg)  08/26/22 143 lb (64.9 kg)      Physical Exam Vitals and nursing note reviewed.  Constitutional:      Appearance: Normal appearance. He is not ill-appearing.  Skin:    General: Skin is warm and dry.     Findings: Lesion and rash present.     Comments:  Rough scaly patches to skin of face at bilateral sideburns, with mild erythema Small patches also present behind both ears Skin growth to posterior helix left pinna with chronic dry scabbed ulceration   Neurological:     Mental Status: He is alert.       Assessment & Plan:   Problem List Items Addressed This Visit     Hypertension    BP staying well controlled off hydrochlorothiazide.  Continue other meds including carvedilol and amlodipine.       Lesion of left earlobe - Primary    Concern for cancerous growth to left external ear at helix.  Refer to dermatology for evaluation. Has previously seen Dr Roseanne Reno      Relevant Orders   Ambulatory referral to Dermatology   Actinic keratoses    Refer back to derm for AKs to face.       Relevant Orders   Ambulatory referral to Dermatology     No orders of the defined types were placed in this encounter.   Orders Placed This Encounter  Procedures   Ambulatory referral to Dermatology    Referral Priority:   Routine    Referral Type:   Consultation    Referral Reason:   Specialty Services Required    Requested Specialty:   Dermatology    Number of Visits Requested:   1    Patient Instructions  We will refer you back to Dr  Marca Ancona office - [facility].  Continue sunscreen use regularly, wide brimmed hat when exposed to sun.   Follow up plan: No follow-ups on file.  Eustaquio Boyden, MD

## 2022-09-26 ENCOUNTER — Ambulatory Visit (INDEPENDENT_AMBULATORY_CARE_PROVIDER_SITE_OTHER): Payer: Medicare Other | Admitting: Dermatology

## 2022-09-26 ENCOUNTER — Encounter: Payer: Self-pay | Admitting: Dermatology

## 2022-09-26 VITALS — BP 149/85 | HR 51

## 2022-09-26 DIAGNOSIS — L570 Actinic keratosis: Secondary | ICD-10-CM | POA: Diagnosis not present

## 2022-09-26 DIAGNOSIS — L578 Other skin changes due to chronic exposure to nonionizing radiation: Secondary | ICD-10-CM | POA: Diagnosis not present

## 2022-09-26 DIAGNOSIS — D492 Neoplasm of unspecified behavior of bone, soft tissue, and skin: Secondary | ICD-10-CM

## 2022-09-26 DIAGNOSIS — C44229 Squamous cell carcinoma of skin of left ear and external auricular canal: Secondary | ICD-10-CM

## 2022-09-26 DIAGNOSIS — W908XXA Exposure to other nonionizing radiation, initial encounter: Secondary | ICD-10-CM
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
## 2022-09-26 NOTE — Patient Instructions (Signed)
Cryotherapy Aftercare  Wash gently with soap and water everyday.   Apply Vaseline and Band-Aid daily until healed.   Wound Care Instructions  Cleanse wound gently with soap and water once a day then pat dry with clean gauze. Apply a thin coat of Petrolatum (petroleum jelly, "Vaseline") over the wound (unless you have an allergy to this). We recommend that you use a new, sterile tube of Vaseline. Do not pick or remove scabs. Do not remove the yellow or white "healing tissue" from the base of the wound.  Cover the wound with fresh, clean, nonstick gauze and secure with paper tape. You may use Band-Aids in place of gauze and tape if the wound is small enough, but would recommend trimming much of the tape off as there is often too much. Sometimes Band-Aids can irritate the skin.  You should call the office for your biopsy report after 1 week if you have not already been contacted.  If you experience any problems, such as abnormal amounts of bleeding, swelling, significant bruising, significant pain, or evidence of infection, please call the office immediately.  FOR ADULT SURGERY PATIENTS: If you need something for pain relief you may take 1 extra strength Tylenol (acetaminophen) AND 2 Ibuprofen (200mg each) together every 4 hours as needed for pain. (do not take these if you are allergic to them or if you have a reason you should not take them.) Typically, you may only need pain medication for 1 to 3 days.      Recommend daily broad spectrum sunscreen SPF 30+ to sun-exposed areas, reapply every 2 hours as needed. Call for new or changing lesions.  Staying in the shade or wearing long sleeves, sun glasses (UVA+UVB protection) and wide brim hats (4-inch brim around the entire circumference of the hat) are also recommended for sun protection.    Due to recent changes in healthcare laws, you may see results of your pathology and/or laboratory studies on MyChart before the doctors have had a chance to  review them. We understand that in some cases there may be results that are confusing or concerning to you. Please understand that not all results are received at the same time and often the doctors may need to interpret multiple results in order to provide you with the best plan of care or course of treatment. Therefore, we ask that you please give us 2 business days to thoroughly review all your results before contacting the office for clarification. Should we see a critical lab result, you will be contacted sooner.   If You Need Anything After Your Visit  If you have any questions or concerns for your doctor, please call our main line at [phone] and press option 4 to reach your doctor's medical assistant. If no one answers, please leave a voicemail as directed and we will return your call as soon as possible. Messages left after 4 pm will be answered the following business day.   You may also send us a message via MyChart. We typically respond to MyChart messages within 1-2 business days.  For prescription refills, please ask your pharmacy to contact our office. Our fax number is [phone].  If you have an urgent issue when the clinic is closed that cannot wait until the next business day, you can page your doctor at the number below.    Please note that while we do our best to be available for urgent issues outside of office hours, we are not available 24/7.     If you have an urgent issue and are unable to reach us, you may choose to seek medical care at your doctor's office, retail clinic, urgent care center, or emergency room.  If you have a medical emergency, please immediately call 911 or go to the emergency department.  Pager Numbers  - Dr. Kowalski: [phone]  - Dr. Moye: [phone]  - Dr. Stewart: [phone]  In the event of inclement weather, please call our main line at [phone] for an update on the status of any delays or closures.  Dermatology Medication  Tips: Please keep the boxes that topical medications come in in order to help keep track of the instructions about where and how to use these. Pharmacies typically print the medication instructions only on the boxes and not directly on the medication tubes.   If your medication is too expensive, please contact our office at [phone] option 4 or send us a message through MyChart.   We are unable to tell what your co-pay for medications will be in advance as this is different depending on your insurance coverage. However, we may be able to find a substitute medication at lower cost or fill out paperwork to get insurance to cover a needed medication.   If a prior authorization is required to get your medication covered by your insurance company, please allow us 1-2 business days to complete this process.  Drug prices often vary depending on where the prescription is filled and some pharmacies may offer cheaper prices.  The website www.goodrx.com contains coupons for medications through different pharmacies. The prices here do not account for what the cost may be with help from insurance (it may be cheaper with your insurance), but the website can give you the price if you did not use any insurance.  - You can print the associated coupon and take it with your prescription to the pharmacy.  - You may also stop by our office during regular business hours and pick up a GoodRx coupon card.  - If you need your prescription sent electronically to a different pharmacy, notify our office through [facility] MyChart or by phone at [phone] option 4.     Si Usted Necesita Algo Despus de Su Visita  Tambin puede enviarnos un mensaje a travs de MyChart. Por lo general respondemos a los mensajes de MyChart en el transcurso de 1 a 2 das hbiles.  Para renovar recetas, por favor pida a su farmacia que se ponga en contacto con nuestra oficina. Nuestro nmero de fax es el [phone].  Si tiene un  asunto urgente cuando la clnica est cerrada y que no puede esperar hasta el siguiente da hbil, puede llamar/localizar a su doctor(a) al nmero que aparece a continuacin.   Por favor, tenga en cuenta que aunque hacemos todo lo posible para estar disponibles para asuntos urgentes fuera del horario de oficina, no estamos disponibles las 24 horas del da, los 7 das de la semana.   Si tiene un problema urgente y no puede comunicarse con nosotros, puede optar por buscar atencin mdica  en el consultorio de su doctor(a), en una clnica privada, en un centro de atencin urgente o en una sala de emergencias.  Si tiene una emergencia mdica, por favor llame inmediatamente al 911 o vaya a la sala de emergencias.  Nmeros de bper  - Dr. Kowalski: [phone]  - Dra. Moye: [phone]  - Dra. Stewart: [phone]  En caso de inclemencias del tiempo, por favor llame a nuestra lnea principal   al [phone] para una actualizacin sobre el estado de cualquier retraso o cierre.  Consejos para la medicacin en dermatologa: Por favor, guarde las cajas en las que vienen los medicamentos de uso tpico para ayudarle a seguir las instrucciones sobre dnde y cmo usarlos. Las farmacias generalmente imprimen las instrucciones del medicamento slo en las cajas y no directamente en los tubos del medicamento.   Si su medicamento es muy caro, por favor, pngase en contacto con nuestra oficina llamando al [phone] y presione la opcin 4 o envenos un mensaje a travs de MyChart.   No podemos decirle cul ser su copago por los medicamentos por adelantado ya que esto es diferente dependiendo de la cobertura de su seguro. Sin embargo, es posible que podamos encontrar un medicamento sustituto a menor costo o llenar un formulario para que el seguro cubra el medicamento que se considera necesario.   Si se requiere una autorizacin previa para que su compaa de seguros cubra su medicamento, por favor  permtanos de 1 a 2 das hbiles para completar este proceso.  Los precios de los medicamentos varan con frecuencia dependiendo del lugar de dnde se surte la receta y alguna farmacias pueden ofrecer precios ms baratos.  El sitio web www.goodrx.com tiene cupones para medicamentos de diferentes farmacias. Los precios aqu no tienen en cuenta lo que podra costar con la ayuda del seguro (puede ser ms barato con su seguro), pero el sitio web puede darle el precio si no utiliz ningn seguro.  - Puede imprimir el cupn correspondiente y llevarlo con su receta a la farmacia.  - Tambin puede pasar por nuestra oficina durante el horario de atencin regular y recoger una tarjeta de cupones de GoodRx.  - Si necesita que su receta se enve electrnicamente a una farmacia diferente, informe a nuestra oficina a travs de MyChart de [place] o por telfono llamando al [phone] y presione la opcin 4.  

## 2022-10-03 HISTORY — PX: MOHS SURGERY: SUR867

## 2022-10-07 ENCOUNTER — Telehealth: Payer: Self-pay

## 2022-10-07 DIAGNOSIS — S50862A Insect bite (nonvenomous) of left forearm, initial encounter: Secondary | ICD-10-CM | POA: Diagnosis not present

## 2022-10-07 DIAGNOSIS — D0422 Carcinoma in situ of skin of left ear and external auricular canal: Secondary | ICD-10-CM

## 2022-10-07 NOTE — Telephone Encounter (Signed)
Called patient. N/A. LMOVM to return my call.  

## 2022-10-07 NOTE — Telephone Encounter (Signed)
-----   Message from [place]s sent at 10/04/2022  6:03 PM EDT ----- Diagnosis: Skin , left mid helix MODERATELY DIFFERENTIATED SQUAMOUS CELL CARCINOMA, ADENOID VARIANT, CRUSTED, DEEP MARGIN INVOLVED  Please call with diagnosis and determine where the patient would like to have Mohs surgery.  Explanation: This is a squamous cell skin cancer that has grown beyond the surface of the skin and is invading the second layer of the skin. It has the potential to spread beyond the skin and threaten your health, so I recommend treating it.  Treatment: Given the location and type of skin cancer, I recommend Mohs surgery. Mohs surgery involves cutting out the skin cancer and then checking under the microscope to ensure the whole skin cancer was removed. If any skin cancer remains, the surgeon will cut out more until it is fully removed. The cure rate is about 98-99%. Once the Mohs surgeon confirms the skin cancer is out, they will discuss the options to repair or heal the area. You must take it easy for about two weeks after surgery (no lifting over 10-15 lbs, avoid activity to get your heart rate and blood pressure up). It is done at another office outside of [place] ([place], [place], or [place]).  If the patient asks for my recommendation for Mohs surgeon, I recommend Dr Caprice Beaver or Dr Coralie Carpen at [facility] if the patient is able to make the trip.

## 2022-10-08 NOTE — Telephone Encounter (Signed)
Spoke with patient and advised him of BX results. I will send referral to [facility]. Patient state [facility] is closer for him.   Dr. Katrinka Blazing do you want to set up any follow ups in office for patient in the future?

## 2022-10-09 NOTE — Addendum Note (Signed)
Addended by: Dorathy Daft R on: 10/09/2022 05:28 PM   Modules accepted: Orders

## 2022-10-09 NOTE — Telephone Encounter (Signed)
Patient scheduled for TBSE. aw

## 2022-10-14 ENCOUNTER — Other Ambulatory Visit: Payer: Self-pay | Admitting: Family Medicine

## 2022-10-14 DIAGNOSIS — M545 Low back pain, unspecified: Secondary | ICD-10-CM

## 2022-10-14 DIAGNOSIS — G8929 Other chronic pain: Secondary | ICD-10-CM

## 2022-10-14 DIAGNOSIS — R42 Dizziness and giddiness: Secondary | ICD-10-CM

## 2022-10-15 ENCOUNTER — Other Ambulatory Visit (HOSPITAL_COMMUNITY): Payer: Self-pay | Admitting: Family Medicine

## 2022-10-15 DIAGNOSIS — I779 Disorder of arteries and arterioles, unspecified: Secondary | ICD-10-CM

## 2022-10-16 NOTE — Telephone Encounter (Signed)
Name of Medication:  Tramadol Name of Pharmacy:  [facility] Last Fill or Written Date and Quantity:  07/02/22, #20 Last Office Visit and Type:  09/24/22, skin prob Next Office Visit and Type:  01/01/23, 6 mo DM/HTN f/u Last Controlled Substance Agreement Date:  09/28/12 Last UDS:  09/28/12  Meclizine last filled:  07/02/22, #20

## 2022-10-17 NOTE — Telephone Encounter (Signed)
ERx 

## 2022-10-31 DIAGNOSIS — C44229 Squamous cell carcinoma of skin of left ear and external auricular canal: Secondary | ICD-10-CM | POA: Diagnosis not present

## 2022-11-11 ENCOUNTER — Telehealth: Payer: Self-pay

## 2022-11-11 NOTE — Telephone Encounter (Signed)
Updating specimen tracking and history from [facility] progress notes of L mid helix.

## 2022-11-19 ENCOUNTER — Ambulatory Visit (INDEPENDENT_AMBULATORY_CARE_PROVIDER_SITE_OTHER): Payer: Medicare Other | Admitting: Dermatology

## 2022-11-19 ENCOUNTER — Encounter: Payer: Self-pay | Admitting: Dermatology

## 2022-11-19 DIAGNOSIS — L578 Other skin changes due to chronic exposure to nonionizing radiation: Secondary | ICD-10-CM

## 2022-11-19 DIAGNOSIS — L570 Actinic keratosis: Secondary | ICD-10-CM | POA: Diagnosis not present

## 2022-11-19 DIAGNOSIS — Z86007 Personal history of in-situ neoplasm of skin: Secondary | ICD-10-CM | POA: Diagnosis not present

## 2022-11-19 DIAGNOSIS — W908XXA Exposure to other nonionizing radiation, initial encounter: Secondary | ICD-10-CM

## 2022-11-19 DIAGNOSIS — L720 Epidermal cyst: Secondary | ICD-10-CM | POA: Diagnosis not present

## 2022-11-19 DIAGNOSIS — Z1283 Encounter for screening for malignant neoplasm of skin: Secondary | ICD-10-CM | POA: Diagnosis not present

## 2022-11-19 DIAGNOSIS — D229 Melanocytic nevi, unspecified: Secondary | ICD-10-CM

## 2022-11-19 DIAGNOSIS — D1801 Hemangioma of skin and subcutaneous tissue: Secondary | ICD-10-CM

## 2022-11-19 DIAGNOSIS — Z85828 Personal history of other malignant neoplasm of skin: Secondary | ICD-10-CM

## 2022-11-19 DIAGNOSIS — R238 Other skin changes: Secondary | ICD-10-CM

## 2022-11-19 DIAGNOSIS — L821 Other seborrheic keratosis: Secondary | ICD-10-CM | POA: Diagnosis not present

## 2022-11-19 DIAGNOSIS — L814 Other melanin hyperpigmentation: Secondary | ICD-10-CM

## 2022-11-19 DIAGNOSIS — L820 Inflamed seborrheic keratosis: Secondary | ICD-10-CM

## 2022-11-19 NOTE — Patient Instructions (Signed)
Cryotherapy Aftercare  Wash gently with soap and water everyday.   Apply Vaseline Jelly daily until healed.   Recommend daily broad spectrum sunscreen SPF 30+ to sun-exposed areas, reapply every 2 hours as needed. Call for new or changing lesions.  Staying in the shade or wearing long sleeves, sun glasses (UVA+UVB protection) and wide brim hats (4-inch brim around the entire circumference of the hat) are also recommended for sun protection.    Melanoma ABCDEs  Melanoma is the most dangerous type of skin cancer, and is the leading cause of death from skin disease.  You are more likely to develop melanoma if you: Have light-colored skin, light-colored eyes, or red or blond hair Spend a lot of time in the sun Tan regularly, either outdoors or in a tanning bed Have had blistering sunburns, especially during childhood Have a close family member who has had a melanoma Have atypical moles or large birthmarks  Early detection of melanoma is key since treatment is typically straightforward and cure rates are extremely high if we catch it early.   The first sign of melanoma is often a change in a mole or a new dark spot.  The ABCDE system is a way of remembering the signs of melanoma.  A for asymmetry:  The two halves do not match. B for border:  The edges of the growth are irregular. C for color:  A mixture of colors are present instead of an even brown color. D for diameter:  Melanomas are usually (but not always) greater than 6mm - the size of a pencil eraser. E for evolution:  The spot keeps changing in size, shape, and color.  Please check your skin once per month between visits. You can use a small mirror in front and a large mirror behind you to keep an eye on the back side or your body.   If you see any new or changing lesions before your next follow-up, please call to schedule a visit.  Please continue daily skin protection including broad spectrum sunscreen SPF 30+ to sun-exposed  areas, reapplying every 2 hours as needed when you're outdoors.   Staying in the shade or wearing long sleeves, sun glasses (UVA+UVB protection) and wide brim hats (4-inch brim around the entire circumference of the hat) are also recommended for sun protection.     Due to recent changes in healthcare laws, you may see results of your pathology and/or laboratory studies on MyChart before the doctors have had a chance to review them. We understand that in some cases there may be results that are confusing or concerning to you. Please understand that not all results are received at the same time and often the doctors may need to interpret multiple results in order to provide you with the best plan of care or course of treatment. Therefore, we ask that you please give [place] 2 business days to thoroughly review all your results before contacting the office for clarification. Should we see a critical lab result, you will be contacted sooner.   If You Need Anything After Your Visit  If you have any questions or concerns for your doctor, please call our main line at [phone] and press option 4 to reach your doctor's medical assistant. If no one answers, please leave a voicemail as directed and we will return your call as soon as possible. Messages left after 4 pm will be answered the following business day.   You may also send [place] a message via MyChart.  We typically respond to MyChart messages within 1-2 business days.  For prescription refills, please ask your pharmacy to contact our office. Our fax number is [phone].  If you have an urgent issue when the clinic is closed that cannot wait until the next business day, you can page your doctor at the number below.    Please note that while we do our best to be available for urgent issues outside of office hours, we are not available 24/7.   If you have an urgent issue and are unable to reach [place], you may choose to seek medical care at your doctor's  office, retail clinic, urgent care center, or emergency room.  If you have a medical emergency, please immediately call 911 or go to the emergency department.  Pager Numbers  - Dr. Gwen Pounds: [phone]  - Dr. Roseanne Reno: [phone]  - Dr. Katrinka Blazing: [phone]   In the event of inclement weather, please call our main line at [phone] for an update on the status of any delays or closures.  Dermatology Medication Tips: Please keep the boxes that topical medications come in in order to help keep track of the instructions about where and how to use these. Pharmacies typically print the medication instructions only on the boxes and not directly on the medication tubes.   If your medication is too expensive, please contact our office at [phone] option 4 or send [place] a message through MyChart.   We are unable to tell what your co-pay for medications will be in advance as this is different depending on your insurance coverage. However, we may be able to find a substitute medication at lower cost or fill out paperwork to get insurance to cover a needed medication.   If a prior authorization is required to get your medication covered by your insurance company, please allow [place] 1-2 business days to complete this process.  Drug prices often vary depending on where the prescription is filled and some pharmacies may offer cheaper prices.  The website www.goodrx.com contains coupons for medications through different pharmacies. The prices here do not account for what the cost may be with help from insurance (it may be cheaper with your insurance), but the website can give you the price if you did not use any insurance.  - You can print the associated coupon and take it with your prescription to the pharmacy.  - You may also stop by our office during regular business hours and pick up a GoodRx coupon card.  - If you need your prescription sent electronically to a different pharmacy, notify our  office through [facility] or by phone at [phone] option 4.     Si Usted Necesita Algo Despus de Su Visita  Tambin puede enviarnos un mensaje a travs de Clinical cytogeneticist. Por lo general respondemos a los mensajes de MyChart en el transcurso de 1 a 2 das hbiles.  Para renovar recetas, por favor pida a su farmacia que se ponga en contacto con nuestra oficina. Annie Sable de fax es [place] [phone].  Si tiene un asunto urgente cuando la clnica est cerrada y que no puede esperar hasta el siguiente da hbil, puede llamar/localizar a su doctor(a) al nmero que aparece a continuacin.   Por favor, tenga en cuenta que aunque hacemos todo lo posible para estar disponibles para asuntos urgentes fuera del horario de [place], no estamos disponibles las 24 horas del da, los 7 [address]  [address] de la [place].   Si tiene un problema urgente y no  puede comunicarse con nosotros, puede optar por buscar atencin mdica  en el consultorio de su doctor(a), en una clnica privada, en un centro de atencin urgente o en una sala de emergencias.  Si tiene Engineer, drilling, por favor llame inmediatamente al 911 o vaya a la sala de emergencias.  Nmeros de bper  - Dr. Gwen Pounds: [phone]  - Dra. Roseanne Reno: [phone]  - Dr. Katrinka Blazing: [phone]   En caso de inclemencias del tiempo, por favor llame a Lacy Duverney principal al [phone] para una actualizacin sobre el [place] de cualquier retraso o cierre.  Consejos para la medicacin en dermatologa: Por favor, guarde las cajas en las que vienen los medicamentos de uso tpico para ayudarle a seguir las instrucciones sobre dnde y cmo usarlos. Las farmacias generalmente imprimen las instrucciones del medicamento slo en las cajas y no directamente en los tubos del [place].   Si su medicamento es muy caro, por favor, pngase en contacto con Rolm Gala llamando al [phone] y presione la opcin 4 o envenos un mensaje a travs de Clinical cytogeneticist.    No podemos decirle cul ser su copago por los medicamentos por adelantado ya que esto es diferente dependiendo de la cobertura de su seguro. Sin embargo, es posible que podamos encontrar un medicamento sustituto a Audiological scientist un formulario para que el seguro cubra el medicamento que se considera necesario.   Si se requiere una autorizacin previa para que su compaa de seguros [place] su medicamento, por favor permtanos de 1 a 2 das hbiles para completar [address]  Los precios de los medicamentos varan con frecuencia dependiendo del Environmental consultant de dnde se surte la receta y alguna farmacias pueden ofrecer precios ms baratos.  El sitio web www.goodrx.com tiene cupones para medicamentos de Health and safety inspector. Los precios aqu no tienen en cuenta lo que podra costar con la ayuda del seguro (puede ser ms barato con su seguro), pero el sitio web puede darle el precio si no utiliz Tourist information centre manager.  - Puede imprimir el cupn correspondiente y llevarlo con su receta a la farmacia.  - Tambin puede pasar por nuestra oficina durante el horario de atencin regular y Education officer, museum una tarjeta de cupones de GoodRx.  - Si necesita que su receta se enve electrnicamente a una farmacia diferente, informe a nuestra oficina a travs de MyChart de [place] o por telfono llamando al [phone] y presione la opcin 4.
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
## 2022-11-20 ENCOUNTER — Other Ambulatory Visit: Payer: Self-pay | Admitting: *Deleted

## 2022-11-20 DIAGNOSIS — C9110 Chronic lymphocytic leukemia of B-cell type not having achieved remission: Secondary | ICD-10-CM

## 2022-11-21 ENCOUNTER — Inpatient Hospital Stay (HOSPITAL_BASED_OUTPATIENT_CLINIC_OR_DEPARTMENT_OTHER): Payer: Medicare Other | Admitting: Oncology

## 2022-11-21 ENCOUNTER — Inpatient Hospital Stay: Payer: Medicare Other | Attending: Oncology

## 2022-11-21 ENCOUNTER — Encounter: Payer: Self-pay | Admitting: Oncology

## 2022-11-21 VITALS — BP 132/82 | HR 64 | Temp 97.6°F | Wt 146.0 lb

## 2022-11-21 DIAGNOSIS — C9110 Chronic lymphocytic leukemia of B-cell type not having achieved remission: Secondary | ICD-10-CM

## 2022-11-21 DIAGNOSIS — Z87891 Personal history of nicotine dependence: Secondary | ICD-10-CM | POA: Insufficient documentation

## 2022-11-21 DIAGNOSIS — Z800 Family history of malignant neoplasm of digestive organs: Secondary | ICD-10-CM | POA: Diagnosis not present

## 2022-11-21 DIAGNOSIS — D649 Anemia, unspecified: Secondary | ICD-10-CM | POA: Insufficient documentation

## 2022-11-21 DIAGNOSIS — D693 Immune thrombocytopenic purpura: Secondary | ICD-10-CM | POA: Diagnosis not present

## 2022-11-21 DIAGNOSIS — Z85828 Personal history of other malignant neoplasm of skin: Secondary | ICD-10-CM | POA: Insufficient documentation

## 2022-11-21 DIAGNOSIS — C9111 Chronic lymphocytic leukemia of B-cell type in remission: Secondary | ICD-10-CM | POA: Diagnosis not present

## 2022-11-21 LAB — CBC WITH DIFFERENTIAL/PLATELET
Abs Immature Granulocytes: 0.01 10*3/uL (ref 0.00–0.07)
Basophils Absolute: 0 10*3/uL (ref 0.0–0.1)
Basophils Relative: 1 %
Eosinophils Absolute: 0.1 10*3/uL (ref 0.0–0.5)
Eosinophils Relative: 2 %
HCT: 37.4 % — ABNORMAL LOW (ref 39.0–52.0)
Hemoglobin: 12 g/dL — ABNORMAL LOW (ref 13.0–17.0)
Immature Granulocytes: 0 %
Lymphocytes Relative: 28 %
Lymphs Abs: 1.1 10*3/uL (ref 0.7–4.0)
MCH: 30.1 pg (ref 26.0–34.0)
MCHC: 32.1 g/dL (ref 30.0–36.0)
MCV: 93.7 fL (ref 80.0–100.0)
Monocytes Absolute: 0.3 10*3/uL (ref 0.1–1.0)
Monocytes Relative: 8 %
Neutro Abs: 2.5 10*3/uL (ref 1.7–7.7)
Neutrophils Relative %: 61 %
Platelets: 131 10*3/uL — ABNORMAL LOW (ref 150–400)
RBC: 3.99 MIL/uL — ABNORMAL LOW (ref 4.22–5.81)
RDW: 12.4 % (ref 11.5–15.5)
WBC: 4 10*3/uL (ref 4.0–10.5)
nRBC: 0 % (ref 0.0–0.2)

## 2022-11-21 NOTE — Progress Notes (Signed)
Patient had a CT on 07/13/2022, other than that he has no new questions or concerns for the doctor today. He did tell me that he fell about 3 times within the last 4 months due to him tripping by not lifting his feet up high enough when he is outside especially.
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
## 2022-11-23 ENCOUNTER — Other Ambulatory Visit: Payer: Self-pay | Admitting: Internal Medicine

## 2022-12-09 ENCOUNTER — Ambulatory Visit (INDEPENDENT_AMBULATORY_CARE_PROVIDER_SITE_OTHER): Payer: Medicare Other | Admitting: Internal Medicine

## 2022-12-09 ENCOUNTER — Encounter: Payer: Self-pay | Admitting: Internal Medicine

## 2022-12-09 VITALS — BP 136/84 | HR 62 | Ht 71.0 in | Wt 146.0 lb

## 2022-12-09 DIAGNOSIS — N1832 Chronic kidney disease, stage 3b: Secondary | ICD-10-CM

## 2022-12-09 DIAGNOSIS — G63 Polyneuropathy in diseases classified elsewhere: Secondary | ICD-10-CM | POA: Insufficient documentation

## 2022-12-09 DIAGNOSIS — Z794 Long term (current) use of insulin: Secondary | ICD-10-CM | POA: Diagnosis not present

## 2022-12-09 DIAGNOSIS — E1122 Type 2 diabetes mellitus with diabetic chronic kidney disease: Secondary | ICD-10-CM

## 2022-12-09 DIAGNOSIS — E1165 Type 2 diabetes mellitus with hyperglycemia: Secondary | ICD-10-CM

## 2022-12-09 DIAGNOSIS — E1142 Type 2 diabetes mellitus with diabetic polyneuropathy: Secondary | ICD-10-CM | POA: Diagnosis not present

## 2022-12-09 LAB — POCT GLYCOSYLATED HEMOGLOBIN (HGB A1C): Hemoglobin A1C: 8.2 % — AB (ref 4.0–5.6)

## 2022-12-09 MED ORDER — GABAPENTIN 100 MG PO CAPS: 100.0000 mg | ORAL_CAPSULE | Freq: Every day | ORAL | 3 refills | Status: DC

## 2022-12-09 MED ORDER — PIOGLITAZONE HCL 15 MG PO TABS: 15.0000 mg | ORAL_TABLET | Freq: Every day | ORAL | 3 refills | Status: DC

## 2022-12-09 NOTE — Patient Instructions (Addendum)
Continue  Lantus 10 units daily  Novolog 4 units with Breakfast and 4 units with Supper  Novolog correctional insulin: ADD extra units on insulin to your meal-time Novolog dose if your blood sugars are higher than 200. Use the scale below to help guide you:   Blood sugar before meal Number of units to inject  Less than 200 0 unit  201 -  260 1 units  261 -  320 2 units  321 -  380 3 units  381 -  440 4 units    Start  gabapentin 100 mg, 1 tablet at bedtime for feet tingling      HOW TO TREAT LOW BLOOD SUGARS (Blood sugar LESS THAN 70 MG/DL) Please follow the RULE OF 15 for the treatment of hypoglycemia treatment (when your (blood sugars are less than 70 mg/dL)   STEP 1: Take 15 grams of carbohydrates when your blood sugar is low, which includes:  3-4 GLUCOSE TABS  OR 3-4 OZ OF JUICE OR REGULAR SODA OR ONE TUBE OF GLUCOSE GEL    STEP 2: RECHECK blood sugar in 15 MINUTES STEP 3: If your blood sugar is still low at the 15 minute recheck --> then, go back to STEP 1 and treat AGAIN with another 15 grams of carbohydrates.

## 2022-12-09 NOTE — Progress Notes (Unsigned)
Name: Scott [name]  MRN/ DOB: [id number], 1940/07/13   Age/ Sex: 82 y.o., male    PCP: Eustaquio Boyden, MD   Reason for Endocrinology Evaluation: Type 2 Diabetes Mellitus     Date of Initial Endocrinology Visit: 09/27/2021    PATIENT IDENTIFIER: Scott [name] is a 82 y.o. male with a past medical history of T2DM, CLL and hypothyroidism. The patient presented for initial endocrinology clinic visit on 09/27/2021 for consultative assistance with his diabetes management.    HPI: Mr. Knigge was    Diagnosed with DM 1990 Prior Medications tried/Intolerance: metformin , glipizide  Hemoglobin A1c has ranged from 6.9% in 2019, peaking at 8.6% in 2023.    Patient with pancreatic insufficiency and is on Creon Patient follows with cardiology for CAD He also follows with oncology for leukemia  On his initial visit to our clinic he had an A1c of 7.8%, he was already on MDI regimen which we adjusted, he was provided with a correction scale   He was started on Jardiance through PCPs office 06/2022  SUBJECTIVE:   During the last visit (08/26/2022): A1c 7.9%     Today (12/09/22): Scott [name] is here for a follow up on diabetes management. He checks his  blood sugars multiple times daily. The patient has not had hypoglycemic episodes since the last clinic visit.   He was seen by GI 10/02/2021 for exocrine pancreatic insufficiency He continues to follow-up with oncology for CLL   Denies nausea or vomiting  Denies constipation or diarrhea  Denies LE edema  Continues with tingling of the feet at night   HOME DIABETES REGIMEN: Lantus  10 units daily  NovoLog 3-4 units BID Correction factor: NovoLog (BG -140/60)    CONTINUOUS GLUCOSE MONITORING RECORD INTERPRETATION    Dates of Recording: 9/24-10/09/2022  Sensor description:freestyle libre  Results statistics:   CGM use % of time 97  Average and SD 207/21.9  Time in range 30 %  % Time Above 180 53  % Time above 250 17  %  Time Below target 0    Glycemic patterns summary: BGs remain high throughout the day and night Hyperglycemic episodes  post-prandial  Hypoglycemic episodes occurred where, during the day  Overnight : Remain high     DIABETIC COMPLICATIONS: Microvascular complications:  CKD III, neuropathy Denies: retinopathy Last eye exam: Completed 09/2021  Macrovascular complications:  CAD Denies: PVD, CVA   PAST HISTORY: Past Medical History:  Past Medical History:  Diagnosis Date   Acquired hand deformity 1962   hand saw accident at work   Anemia 10/11/2011   Arthritis    Bradycardia 10/10/2011   CAD (coronary artery disease) 10/10/2011   MI s/p PTCA (Dx-OM2 proximal concentric stenosis)   Carotid artery disease (HCC)    Cellulitis of buttock, left 11/17/2018   Chronic edema    CKD (chronic kidney disease) stage 3, GFR 30-59 ml/min (HCC)    [name]   CLL (chronic lymphocytic leukemia) (HCC)    Colon polyps    Cough 04/28/2019   Dysphagia    EGD - reflux esophagitis, one area of stomach with focal intestinal metaplasia (06/2019) Dr Allegra Lai   Generalized headaches    frequent   GI bleed 07/08/2017   Glaucoma    s/p laser surgery   HLD (hyperlipidemia)    Hyperkalemia 02/28/2018   Hypertension    Hypothyroidism 10/10/2011   Iron deficiency anemia due to chronic blood loss    Squamous cell carcinoma of skin 06/15/2019  Right posterior ear. SCCis, hypertrophic, crusted   Squamous cell carcinoma of skin 09/26/2022   Left mid helix, mohs 10/31/22. Moderately differentiated, adenoid variant, crusted, deep margin involved.   Sternal fracture 09/13/2017   Thrombocytopenia (HCC) 10/11/2011   Type 2 diabetes with nephropathy [facility])    DM refresher course [facility] (04/2013)   Past Surgical History:  Past Surgical History:  Procedure Laterality Date   BACK SURGERY     cervical neck   CATARACT EXTRACTION, BILATERAL Bilateral 2017   COLONOSCOPY  11/2008   1 polyp, diverticulosis,  rec rpt 5 yrs (Dr. Randa Evens, Deboraha Sprang)   COLONOSCOPY  06/2014   hyperplastic polyp, rpt 5 yrs Randa Evens)   COLONOSCOPY WITH PROPOFOL N/A 11/17/2017   TA, HP, (Vanga, Loel Dubonnet, MD)   ESOPHAGOGASTRODUODENOSCOPY (EGD) WITH PROPOFOL N/A 11/17/2017   healing erosive gastritis, intestinal metaplasia, neg H pylori (Vanga, Loel Dubonnet, MD)   ESOPHAGOGASTRODUODENOSCOPY (EGD) WITH PROPOFOL N/A 06/28/2019   erosive gastropathy with stigmata of recent bleed, granular tissue in esophagus biopsied- reflux esophagitis, small HH (Vanga, Loel Dubonnet, MD)   EYE SURGERY  2012   laser surgery for glaucoma   PORTACATH PLACEMENT Left 12/09/2018   Procedure: INSERTION PORT-A-CATH;  Surgeon: Henrene Dodge, MD;  Location: [facility] ORS;  Service: General;  Laterality: Left;   PTCA  1994, 1995   US ECHOCARDIOGRAPHY  10/2013   inferior wall hypokinesis, mild LVH, EF 50-55%, mild MR and LA dilation    Social History:  reports that he quit smoking about 44 years ago. His smoking use included cigarettes. He has been exposed to tobacco smoke. He has never used smokeless tobacco. He reports that he does not drink alcohol and does not use drugs. Family History:  Family History  Problem Relation Age of Onset   Diabetes Sister    Stomach cancer Sister 13   Pneumonia Father        caused death   Other Brother        no communication with brother so unsure of any health conditions   Coronary artery disease Son 39       5v CABG and stents   Hyperlipidemia Sister    Stroke Neg Hx    Heart attack Neg Hx      HOME MEDICATIONS: Allergies as of 12/09/2022       Reactions   Lisinopril Other (See Comments)   Hyperkalemia even at 5mg  dose   Prednisone Other (See Comments)   Marked symptomatic hyperglycemia        Medication List        Accurate as of December 09, 2022  1:39 PM. If you have any questions, ask your nurse or doctor.          acetaminophen 650 MG CR tablet Commonly known as: TYLENOL Take 1,300 mg by  mouth 2 (two) times daily.   amLODipine 10 MG tablet Commonly known as: NORVASC Take 1 tablet (10 mg total) by mouth daily.   atorvastatin 80 MG tablet Commonly known as: LIPITOR TAKE 1 TABLET BY MOUTH ON MONDAY, WEDNESDAY AND FRIDAY   carvedilol 6.25 MG tablet Commonly known as: COREG Take 1 tablet (6.25 mg total) by mouth 2 (two) times daily with a meal.   empagliflozin 10 MG Tabs tablet Commonly known as: Jardiance Take 1 tablet (10 mg total) by mouth daily before breakfast.   ezetimibe 10 MG tablet Commonly known as: ZETIA Take 1 tablet (10 mg total) by mouth daily.   FreeStyle [employer] 2 Sensor Misc  1 each by Does not apply route every 14 (fourteen) days.   hydrochlorothiazide 12.5 MG capsule Commonly known as: MICROZIDE Take 12.5 mg by mouth daily.   insulin glargine 100 UNIT/ML Solostar Pen Commonly known as: LANTUS Inject 10 Units into the skin daily.   Insulin Pen Needle 32G X 4 MM Misc 1 Device by Does not apply route in the morning, at noon, in the evening, and at bedtime.   levothyroxine 88 MCG tablet Commonly known as: SYNTHROID Take 1 tablet (88 mcg total) by mouth daily.   lipase/protease/amylase 16109 UNITS Cpep capsule Commonly known as: Creon Take 2 capsules with the first bite of each meal and 1 capsule with the first bite of each snack   meclizine 12.5 MG tablet Commonly known as: ANTIVERT TAKE 1 TABLET BY MOUTH TWICE DAILY AS NEEDED FOR  DIZZINESS  (SEDATION  PRECAUTIONS)   nitroGLYCERIN 0.4 MG SL tablet Commonly known as: NITROSTAT Place 1 tablet (0.4 mg total) under the tongue every 5 (five) minutes as needed for chest pain ((MAX of 3 doses)).   NovoLOG FlexPen 100 UNIT/ML FlexPen Generic drug: insulin aspart MAX DAILY 20 UNITS PER SCALE   ondansetron 4 MG disintegrating tablet Commonly known as: ZOFRAN-ODT Take 1 tablet (4 mg total) by mouth every 8 (eight) hours as needed for nausea or vomiting.   oxymetazoline 0.05 % nasal  spray Commonly known as: AFRIN Place 1 spray into left nostril daily as needed (nose bleeds).   traMADol 50 MG tablet Commonly known as: ULTRAM TAKE 1 TABLET BY MOUTH EVERY 8 HOURS AS NEEDED   traZODone 100 MG tablet Commonly known as: DESYREL Take 1 tablet (100 mg total) by mouth at bedtime as needed. for sleep   triamcinolone ointment 0.5 % Commonly known as: KENALOG Apply 1 application topically 2 (two) times daily.         ALLERGIES: Allergies  Allergen Reactions   Lisinopril Other (See Comments)    Hyperkalemia even at 5mg  dose   Prednisone Other (See Comments)    Marked symptomatic hyperglycemia     REVIEW OF SYSTEMS: A comprehensive ROS was conducted with the patient and is negative except as per HPI     OBJECTIVE:   VITAL SIGNS: BP 136/84 (BP Location: Left Arm, Patient Position: Sitting, Cuff Size: Small)   Pulse 62   Ht 5\' 11"  (1.803 m)   Wt 146 lb (66.2 kg)   SpO2 95%   BMI 20.36 kg/m    PHYSICAL EXAM:  General: Pt appears well and is in NAD  Lungs: Clear with good BS bilat with no rales, rhonchi, or wheezes  Heart: RRR  Extremities:  Lower extremities - No pretibial edema. No lesions.  Neuro: MS is good with appropriate affect, pt is alert and Ox3    DM foot exam: 08/26/2022  The skin of the feet is intact without sores or ulcerations. The pedal pulses are 1+ on right and 1+ on left. The sensation is decreased to a screening 5.07, 10 gram monofilament bilaterally at the heels    DATA REVIEWED:  Lab Results  Component Value Date   HGBA1C 8.2 (A) 12/09/2022   HGBA1C 7.9 (H) 06/25/2022   HGBA1C 7.7 (A) 02/15/2022    Latest Reference Range & Units 07/13/22 11:22  Sodium 135 - 145 mmol/L 137  Potassium 3.5 - 5.1 mmol/L 3.8  Chloride 98 - 111 mmol/L 105  CO2 22 - 32 mmol/L 23  Glucose 70 - 99 mg/dL 604 (H)  BUN  8 - 23 mg/dL 55 (H)  Creatinine 0.27 - 1.24 mg/dL 2.53 (H)  Calcium 8.9 - 10.3 mg/dL 9.1  Anion gap 5 - 15  9  Alkaline  Phosphatase 38 - 126 U/L 84  Albumin 3.5 - 5.0 g/dL 3.9  Lipase 11 - 51 U/L 25  AST 15 - 41 U/L 25  ALT 0 - 44 U/L 19  Total Protein 6.5 - 8.1 g/dL 7.0  Total Bilirubin 0.3 - 1.2 mg/dL 0.8  GFR, Estimated >66 mL/min 38 (L)    ASSESSMENT / PLAN / RECOMMENDATIONS:   1) Type 2 Diabetes Mellitus, Sub-Optimally controlled, With CKD III , neuropathic and macrovascular complications - Most recent A1c of 8.2%. Goal A1c < 7.5 %.    -Diabetes is felt to be pancreatic in origin -SGLT2 inhibitors have been cost prohibitive -With a low BMI of 20, I would avoid any medications that will facilitate further weight loss -Patient has severe fear of hypoglycemia, he will not go to bed without a BG of 200 mg/DL, this makes it difficult to optimize his glycemic control, for example last night in the middle of the night, he had a BG of 168 Mg/DL, he drank orange juice with a BG reading of 268 Mg/DL? -Not a candidate for pioglitazone due to lower extremity edema      MEDICATIONS: Continue Lantus 10 units daily Increase NovoLog 4 units with breakfast and 4 units with supper Continue correction factor: NovoLog (BG -140/60)  EDUCATION / INSTRUCTIONS: BG monitoring instructions: Patient is instructed to check his blood sugars 3 times a day, before meals. Call [facility] if: BG persistently < 70  I reviewed the Rule of 15 for the treatment of hypoglycemia in detail with the patient. Literature supplied.   2) Diabetic complications:  Eye: Does not have known diabetic retinopathy.  Neuro/ Feet: Does  have known diabetic peripheral neuropathy. Renal: Patient does  have known baseline CKD. He is not on an ACEI/ARB at present.patient is intolerant to lisinopril    3) Peripheral neuropathy:  -Patient with bothersome symptoms at night -Will start gabapentin, cautioned against drowsiness/dizziness  Medication Gabapentin 100 mg at bedtime  Follow-up in 3 months   Signed  electronically by: Lyndle Herrlich, MD  [facility] Endocrinology  [facility] [address] [place], [place] [address] Phone: [phone] FAX: [phone]   CC: Eustaquio Boyden, MD [address] [place] [address] Phone: [phone]  Fax: [phone]    Return to Endocrinology clinic as below: Future Appointments  Date Time Provider Department Center  12/09/2022  1:40 PM Fahad Cisse, Konrad Dolores, MD [facility] None  01/06/2023  3:00 PM Eustaquio Boyden, MD [facility]  05/19/2023 11:30 AM Elie Goody, MD [facility] None  05/21/2023 10:30 AM [facility] None  05/21/2023 11:00 AM Jeralyn Ruths, MD [facility] None  05/26/2023 11:30 AM [facility] ANNUAL WELLNESS VISIT 1 [facility]

## 2022-12-10 ENCOUNTER — Encounter: Payer: Self-pay | Admitting: Internal Medicine

## 2022-12-19 ENCOUNTER — Encounter: Payer: Self-pay | Admitting: Family Medicine

## 2022-12-19 ENCOUNTER — Ambulatory Visit: Payer: Medicare Other | Admitting: Family Medicine

## 2022-12-19 VITALS — BP 118/68 | HR 65 | Temp 97.6°F | Ht 71.0 in | Wt 143.4 lb

## 2022-12-19 DIAGNOSIS — Z23 Encounter for immunization: Secondary | ICD-10-CM

## 2022-12-19 DIAGNOSIS — C9110 Chronic lymphocytic leukemia of B-cell type not having achieved remission: Secondary | ICD-10-CM | POA: Diagnosis not present

## 2022-12-19 DIAGNOSIS — K921 Melena: Secondary | ICD-10-CM | POA: Insufficient documentation

## 2022-12-19 DIAGNOSIS — R051 Acute cough: Secondary | ICD-10-CM | POA: Diagnosis not present

## 2022-12-19 MED ORDER — AZITHROMYCIN 250 MG PO TABS: ORAL_TABLET | ORAL | 0 refills | Status: DC

## 2022-12-19 NOTE — Assessment & Plan Note (Signed)
Acute, patient did take recent Pepto-Bismol.  Rectal exam performed with Hemoccult test showing no sign of blood in stool.  Return and ER precautions provided

## 2022-12-19 NOTE — Assessment & Plan Note (Signed)
Acute, viral upper respiratory tract infection with associated diarrhea versus bacterial infection.  Given symptoms moving to chest and ongoing 1 week will treat with antibiotics.  Treat with azithromycin.  Mucinex DM twice daily as needed.

## 2022-12-19 NOTE — Assessment & Plan Note (Signed)
History of CLL in remission.  Reviewed recent oncology note.

## 2022-12-19 NOTE — Patient Instructions (Addendum)
Complete antibiotics.  Can use Mucienx twice daily for cough.  Can use tylenol for sore throat if needed. There was no blood in stool on test. Keep up with fluids.  Go to ER if severe abdominal apin or blood noted in stool.

## 2022-12-19 NOTE — Progress Notes (Signed)
Patient ID: Scott [name], male    DOB: [birth year], 82 y.o.   MRN: [id number]  This visit was conducted in person.  BP 118/68   Pulse 65   Temp 97.6 F (36.4 C) (Oral)   Ht 5\' 11"  (1.803 m)   Wt 143 lb 6 oz (65 kg)   SpO2 98%   BMI 20.00 kg/m    CC:  Chief Complaint  Patient presents with   Cough    C/o cough, hoarseness and chest congestion. Also, c/o black stool and diarrhea. Sxs started about 1 wk ago. Denies nausea/vomiting.     Subjective:   HPI: Scott [name] is a 82 y.o. male Dr. Nicanor Alcon with history of hypertension, coronary artery disease, ITP, chronic kidney disease, type 2 diabetes, CLL presenting on 12/19/2022 for Cough (C/o cough, hoarseness and chest congestion. Also, c/o black stool and diarrhea. Sxs started about 1 wk ago. Denies nausea/vomiting. )  CLL followed by oncology, not currently on chemotherapy Reviewed recent office visit note from Dr. Irving Copas again November 21, 2022 noted to be in remission Thrombocytopenia stable with platelets at 131..  Improved within treatment of CLL Anemia hemoglobin baseline 12 Colonoscopy November 17, 2017 with 2 polyps, EGD on the same day revealed nonbleeding erosive gastropathy with multiple nonbleeding duodenal ulcers, repeat EGD within the normal range 2021  Date of onset: 1 week ago Initial symptoms included hoarse voice, mild ST. Symptoms progressed to productive cough and chest congestion.. darker mucus  No SOB, no wheeze.  No fever.  He has had decreased appetite.    Sick contacts:  wife.. coughing a lot COVID testing:   none     He has tried to treat with pepto bismol... Took a swig of this not sure how much several times. Feels like stool was dark before he drank this    No history of chronic lung disease such as asthma or COPD. Former smoker   He has also noted black stool and diarrhea in the last 2 days.. black watery, no stomach pain. Happening frequently all day. Loss of stool in bed this  morning.   He states "I feel great overall!'  Relevant past medical, surgical, family and social history reviewed and updated as indicated. Interim medical history since our last visit reviewed. Allergies and medications reviewed and updated. Outpatient Medications Prior to Visit  Medication Sig Dispense Refill   acetaminophen (TYLENOL) 650 MG CR tablet Take 1,300 mg by mouth 2 (two) times daily.     amLODipine (NORVASC) 10 MG tablet Take 1 tablet (10 mg total) by mouth daily. 90 tablet 4   atorvastatin (LIPITOR) 80 MG tablet TAKE 1 TABLET BY MOUTH ON MONDAY, WEDNESDAY AND FRIDAY 39 tablet 4   carvedilol (COREG) 6.25 MG tablet Take 1 tablet (6.25 mg total) by mouth 2 (two) times daily with a meal. 180 tablet 4   Continuous Glucose Sensor (FREESTYLE LIBRE 2 SENSOR) MISC 1 each by Does not apply route every 14 (fourteen) days. 6 each 3   ezetimibe (ZETIA) 10 MG tablet Take 1 tablet (10 mg total) by mouth daily. 30 tablet 11   gabapentin (NEURONTIN) 100 MG capsule Take 1 capsule (100 mg total) by mouth at bedtime. 90 capsule 3   insulin glargine (LANTUS) 100 UNIT/ML Solostar Pen Inject 10 Units into the skin daily. 15 mL 3   Insulin Pen Needle 32G X 4 MM MISC 1 Device by Does not apply route in the morning, at noon, in the  evening, and at bedtime. 400 each 3   levothyroxine (SYNTHROID) 88 MCG tablet Take 1 tablet (88 mcg total) by mouth daily. 90 tablet 3   lipase/protease/amylase (CREON) 36000 UNITS CPEP capsule Take 2 capsules with the first bite of each meal and 1 capsule with the first bite of each snack 240 capsule 11   meclizine (ANTIVERT) 12.5 MG tablet TAKE 1 TABLET BY MOUTH TWICE DAILY AS NEEDED FOR  DIZZINESS  (SEDATION  PRECAUTIONS) 20 tablet 0   nitroGLYCERIN (NITROSTAT) 0.4 MG SL tablet Place 1 tablet (0.4 mg total) under the tongue every 5 (five) minutes as needed for chest pain ((MAX of 3 doses)). 25 tablet 0   NOVOLOG FLEXPEN 100 UNIT/ML FlexPen MAX DAILY 20 UNITS PER SCALE 15 mL 0    ondansetron (ZOFRAN-ODT) 4 MG disintegrating tablet Take 1 tablet (4 mg total) by mouth every 8 (eight) hours as needed for nausea or vomiting. 12 tablet 0   oxymetazoline (AFRIN) 0.05 % nasal spray Place 1 spray into left nostril daily as needed (nose bleeds).     traMADol (ULTRAM) 50 MG tablet TAKE 1 TABLET BY MOUTH EVERY 8 HOURS AS NEEDED 20 tablet 0   traZODone (DESYREL) 100 MG tablet Take 1 tablet (100 mg total) by mouth at bedtime as needed. for sleep 90 tablet 4   triamcinolone ointment (KENALOG) 0.5 % Apply 1 application topically 2 (two) times daily. 30 g 1   hydrochlorothiazide (MICROZIDE) 12.5 MG capsule Take 12.5 mg by mouth daily.     Facility-Administered Medications Prior to Visit  Medication Dose Route Frequency Provider Last Rate Last Admin   sodium chloride flush (NS) 0.9 % injection 10 mL  10 mL Intravenous Once Jeralyn Ruths, MD         Per HPI unless specifically indicated in ROS section below Review of Systems  Constitutional:  Negative for fatigue and fever.  HENT:  Positive for congestion. Negative for ear pain.   Eyes:  Negative for pain.  Respiratory:  Positive for cough. Negative for shortness of breath.   Cardiovascular:  Negative for chest pain, palpitations and leg swelling.  Gastrointestinal:  Positive for blood in stool. Negative for abdominal pain.  Genitourinary:  Negative for dysuria.  Musculoskeletal:  Negative for arthralgias.  Neurological:  Negative for syncope, light-headedness and headaches.  Psychiatric/Behavioral:  Negative for dysphoric mood.    Objective:  BP 118/68   Pulse 65   Temp 97.6 F (36.4 C) (Oral)   Ht 5\' 11"  (1.803 m)   Wt 143 lb 6 oz (65 kg)   SpO2 98%   BMI 20.00 kg/m   Wt Readings from Last 3 Encounters:  12/19/22 143 lb 6 oz (65 kg)  12/09/22 146 lb (66.2 kg)  11/21/22 146 lb (66.2 kg)      Physical Exam Constitutional:      Appearance: He is well-developed.  HENT:     Head: Normocephalic.     Right  Ear: Hearing normal.     Left Ear: Hearing normal.     Nose: Nose normal.  Neck:     Thyroid: No thyroid mass or thyromegaly.     Vascular: No carotid bruit.     Trachea: Trachea normal.  Cardiovascular:     Rate and Rhythm: Normal rate and regular rhythm.     Pulses: Normal pulses.     Heart sounds: Heart sounds not distant. No murmur heard.    No friction rub. No gallop.     Comments:  No peripheral edema Pulmonary:     Effort: Pulmonary effort is normal. No respiratory distress.     Breath sounds: Normal breath sounds.  Abdominal:     General: Bowel sounds are normal.     Tenderness: There is no abdominal tenderness. There is no right CVA tenderness, left CVA tenderness, guarding or rebound.  Genitourinary:    Rectum: Guaiac result negative. No mass or tenderness. Abnormal anal tone.  Skin:    General: Skin is warm and dry.     Findings: No rash.  Psychiatric:        Speech: Speech normal.        Behavior: Behavior normal.        Thought Content: Thought content normal.       Results for orders placed or performed in visit on 12/09/22  POCT glycosylated hemoglobin (Hb A1C)  Result Value Ref Range   Hemoglobin A1C 8.2 (A) 4.0 - 5.6 %   HbA1c POC (<> result, manual entry)     HbA1c, POC (prediabetic range)     HbA1c, POC (controlled diabetic range)     *Note: Due to a large number of results and/or encounters for the requested time period, some results have not been displayed. A complete set of results can be found in Results Review.    Assessment and Plan  Acute cough Assessment & Plan: Acute, viral upper respiratory tract infection with associated diarrhea versus bacterial infection.  Given symptoms moving to chest and ongoing 1 week will treat with antibiotics.  Treat with azithromycin.  Mucinex DM twice daily as needed.   Encounter for immunization -     Flu Vaccine Trivalent High Dose (Fluad)  Black stool Assessment & Plan: Acute, patient did take recent  Pepto-Bismol.  Rectal exam performed with Hemoccult test showing no sign of blood in stool.  Return and ER precautions provided   CLL (chronic lymphocytic leukemia) (HCC) Assessment & Plan: History of CLL in remission.  Reviewed recent oncology note.   Other orders -     Azithromycin; 2 tab po x 1 day then 1 tab po daily  Dispense: 6 tablet; Refill: 0    No follow-ups on file.   Kerby Nora, MD

## 2023-01-01 ENCOUNTER — Ambulatory Visit: Payer: Medicare Other | Admitting: Family Medicine

## 2023-01-06 ENCOUNTER — Ambulatory Visit (INDEPENDENT_AMBULATORY_CARE_PROVIDER_SITE_OTHER): Payer: Medicare Other | Admitting: Family Medicine

## 2023-01-06 ENCOUNTER — Encounter: Payer: Self-pay | Admitting: Family Medicine

## 2023-01-06 ENCOUNTER — Telehealth: Payer: Self-pay

## 2023-01-06 VITALS — BP 140/72 | HR 55 | Temp 98.1°F | Ht 71.0 in | Wt 144.4 lb

## 2023-01-06 DIAGNOSIS — C9110 Chronic lymphocytic leukemia of B-cell type not having achieved remission: Secondary | ICD-10-CM | POA: Diagnosis not present

## 2023-01-06 DIAGNOSIS — N1832 Chronic kidney disease, stage 3b: Secondary | ICD-10-CM | POA: Diagnosis not present

## 2023-01-06 DIAGNOSIS — K8681 Exocrine pancreatic insufficiency: Secondary | ICD-10-CM

## 2023-01-06 DIAGNOSIS — Z794 Long term (current) use of insulin: Secondary | ICD-10-CM

## 2023-01-06 DIAGNOSIS — E1165 Type 2 diabetes mellitus with hyperglycemia: Secondary | ICD-10-CM

## 2023-01-06 DIAGNOSIS — I10 Essential (primary) hypertension: Secondary | ICD-10-CM

## 2023-01-06 DIAGNOSIS — E1142 Type 2 diabetes mellitus with diabetic polyneuropathy: Secondary | ICD-10-CM

## 2023-01-06 DIAGNOSIS — C44229 Squamous cell carcinoma of skin of left ear and external auricular canal: Secondary | ICD-10-CM | POA: Diagnosis not present

## 2023-01-06 DIAGNOSIS — E1122 Type 2 diabetes mellitus with diabetic chronic kidney disease: Secondary | ICD-10-CM

## 2023-01-06 NOTE — Assessment & Plan Note (Addendum)
Appreciate onc care.  

## 2023-01-06 NOTE — Assessment & Plan Note (Signed)
Continues creon with benefit.

## 2023-01-06 NOTE — Assessment & Plan Note (Signed)
Recently started on gabapentin 100mg  nightly through endo

## 2023-01-06 NOTE — Patient Instructions (Addendum)
Good to see you today You are doing well today  Return as needed or in 6 months for medicare wellness visit

## 2023-01-06 NOTE — Assessment & Plan Note (Signed)
Chronic, stable period followed by endo, latest A1c 8.2% last month.  Appreciate endo care.  He forgot CGM monitor today so unable to review readings.

## 2023-01-06 NOTE — Assessment & Plan Note (Signed)
Appreciate derm care.

## 2023-01-06 NOTE — Assessment & Plan Note (Signed)
Will update renal function next time. SGLT2i was unaffordable.

## 2023-01-06 NOTE — Assessment & Plan Note (Addendum)
Chronic, stable on current regimen-  continue Trial low dose ACEI caused hyperkalemia

## 2023-01-06 NOTE — Progress Notes (Signed)
Ph: [phone] Fax: [phone]   Patient ID: Scott [name], male    DOB: 12-25-1940, 82 y.o.   MRN: [id number]  This visit was conducted in person.  BP (!) 140/72 (BP Location: Left Arm, Cuff Size: Normal)   Pulse (!) 55   Temp 98.1 F (36.7 C) (Oral)   Ht 5\' 11"  (1.803 m)   Wt 144 lb 6 oz (65.5 kg)   SpO2 98%   BMI 20.14 kg/m    CC: 6 mo f/u visit  Subjective:   HPI: Scott [name] is a 82 y.o. male presenting on 01/06/2023 for Medical Management of Chronic Issues (Here for 6 mo HTN f/u.)   HTN - Compliant with current antihypertensive regimen of amlodipine 10mg  daily, carvedilol 6.25mg  bid. Does check blood pressures at home: well controlled. He was very active today - went to vote - and also ate sausage biscuit this morning. No low blood pressure readings or symptoms of dizziness/syncope. Denies HA, vision changes, CP/tightness, SOB, leg swelling. He is now off hydrochlorothiazide.   CLL - sees onc Dr Orlie Dakin.   Recently had squamous cell cancer removed from left pinna. Seeing derm regularly.   Established with endocrinology: Pancreatic insufficiency - on Creon daily.   DM - does regularly check sugars with continuous monitor - 140 yesterday. Compliant with antihyperglycemic regimen which includes: lantus 10u daily, novolog per SSI. SGLT2i unaffordable. Denies low sugars or hypoglycemic symptoms. + paresthesias. Last diabetic eye exam 03/2022. Glucometer brand: Freestyle Libre 2. Last foot exam: 08/2022. Recently started on gabapentin 100mg  nightly for bothersome peripheral neuropathy. CGM data: - left monitor in the car Lab Results  Component Value Date   HGBA1C 8.2 (A) 12/09/2022   Diabetic Foot Exam - Simple   No data filed    Lab Results  Component Value Date   MICROALBUR 102.4 (H) 06/25/2022        Relevant past medical, surgical, family and social history reviewed and updated as indicated. Interim medical history since our last visit  reviewed. Allergies and medications reviewed and updated. Outpatient Medications Prior to Visit  Medication Sig Dispense Refill   acetaminophen (TYLENOL) 650 MG CR tablet Take 1,300 mg by mouth 2 (two) times daily.     amLODipine (NORVASC) 10 MG tablet Take 1 tablet (10 mg total) by mouth daily. 90 tablet 4   atorvastatin (LIPITOR) 80 MG tablet TAKE 1 TABLET BY MOUTH ON MONDAY, WEDNESDAY AND FRIDAY 39 tablet 4   azithromycin (ZITHROMAX) 250 MG tablet 2 tab po x 1 day then 1 tab po daily 6 tablet 0   carvedilol (COREG) 6.25 MG tablet Take 1 tablet (6.25 mg total) by mouth 2 (two) times daily with a meal. 180 tablet 4   Continuous Glucose Sensor (FREESTYLE LIBRE 2 SENSOR) MISC 1 each by Does not apply route every 14 (fourteen) days. 6 each 3   ezetimibe (ZETIA) 10 MG tablet Take 1 tablet (10 mg total) by mouth daily. 30 tablet 11   gabapentin (NEURONTIN) 100 MG capsule Take 1 capsule (100 mg total) by mouth at bedtime. 90 capsule 3   insulin glargine (LANTUS) 100 UNIT/ML Solostar Pen Inject 10 Units into the skin daily. 15 mL 3   Insulin Pen Needle 32G X 4 MM MISC 1 Device by Does not apply route in the morning, at noon, in the evening, and at bedtime. 400 each 3   levothyroxine (SYNTHROID) 88 MCG tablet Take 1 tablet (88 mcg total) by mouth daily. 90 tablet  3   lipase/protease/amylase (CREON) 36000 UNITS CPEP capsule Take 2 capsules with the first bite of each meal and 1 capsule with the first bite of each snack 240 capsule 11   meclizine (ANTIVERT) 12.5 MG tablet TAKE 1 TABLET BY MOUTH TWICE DAILY AS NEEDED FOR  DIZZINESS  (SEDATION  PRECAUTIONS) 20 tablet 0   nitroGLYCERIN (NITROSTAT) 0.4 MG SL tablet Place 1 tablet (0.4 mg total) under the tongue every 5 (five) minutes as needed for chest pain ((MAX of 3 doses)). 25 tablet 0   NOVOLOG FLEXPEN 100 UNIT/ML FlexPen MAX DAILY 20 UNITS PER SCALE 15 mL 0   ondansetron (ZOFRAN-ODT) 4 MG disintegrating tablet Take 1 tablet (4 mg total) by mouth every 8  (eight) hours as needed for nausea or vomiting. 12 tablet 0   oxymetazoline (AFRIN) 0.05 % nasal spray Place 1 spray into left nostril daily as needed (nose bleeds).     traMADol (ULTRAM) 50 MG tablet TAKE 1 TABLET BY MOUTH EVERY 8 HOURS AS NEEDED 20 tablet 0   traZODone (DESYREL) 100 MG tablet Take 1 tablet (100 mg total) by mouth at bedtime as needed. for sleep 90 tablet 4   triamcinolone ointment (KENALOG) 0.5 % Apply 1 application topically 2 (two) times daily. 30 g 1   Facility-Administered Medications Prior to Visit  Medication Dose Route Frequency Provider Last Rate Last Admin   sodium chloride flush (NS) 0.9 % injection 10 mL  10 mL Intravenous Once Orlie Dakin, Tollie Pizza, MD         Per HPI unless specifically indicated in ROS section below Review of Systems  Objective:  BP (!) 140/72 (BP Location: Left Arm, Cuff Size: Normal)   Pulse (!) 55   Temp 98.1 F (36.7 C) (Oral)   Ht 5\' 11"  (1.803 m)   Wt 144 lb 6 oz (65.5 kg)   SpO2 98%   BMI 20.14 kg/m   Wt Readings from Last 3 Encounters:  01/06/23 144 lb 6 oz (65.5 kg)  12/19/22 143 lb 6 oz (65 kg)  12/09/22 146 lb (66.2 kg)      Physical Exam Vitals and nursing note reviewed.  Constitutional:      Appearance: Normal appearance. He is not ill-appearing.  HENT:     Head: Normocephalic and atraumatic.     Mouth/Throat:     Mouth: Mucous membranes are moist.     Pharynx: Oropharynx is clear. No oropharyngeal exudate or posterior oropharyngeal erythema.  Eyes:     Extraocular Movements: Extraocular movements intact.     Conjunctiva/sclera: Conjunctivae normal.     Pupils: Pupils are equal, round, and reactive to light.  Cardiovascular:     Rate and Rhythm: Normal rate and regular rhythm.     Pulses: Normal pulses.     Heart sounds: Normal heart sounds. No murmur heard. Pulmonary:     Effort: Pulmonary effort is normal. No respiratory distress.     Breath sounds: Normal breath sounds. No wheezing, rhonchi or rales.   Musculoskeletal:     Right lower leg: No edema.     Left lower leg: No edema.     Comments: See HPI for foot exam if done  Skin:    General: Skin is warm and dry.     Findings: No rash.  Neurological:     Mental Status: He is alert.  Psychiatric:        Mood and Affect: Mood normal.        Behavior: Behavior normal.  Results for orders placed or performed in visit on 12/09/22  POCT glycosylated hemoglobin (Hb A1C)  Result Value Ref Range   Hemoglobin A1C 8.2 (A) 4.0 - 5.6 %   HbA1c POC (<> result, manual entry)     HbA1c, POC (prediabetic range)     HbA1c, POC (controlled diabetic range)     *Note: Due to a large number of results and/or encounters for the requested time period, some results have not been displayed. A complete set of results can be found in Results Review.   Lab Results  Component Value Date   NA 137 07/13/2022   CL 105 07/13/2022   K 3.8 07/13/2022   CO2 23 07/13/2022   BUN 55 (H) 07/13/2022   CREATININE 1.78 (H) 07/13/2022   GFRNONAA 38 (L) 07/13/2022   CALCIUM 9.1 07/13/2022   PHOS 3.9 06/25/2022   ALBUMIN 3.9 07/13/2022   GLUCOSE 214 (H) 07/13/2022    Assessment & Plan:   Problem List Items Addressed This Visit     Hypertension - Primary    Chronic, stable on current regimen-  continue Trial low dose ACEI caused hyperkalemia      CKD (chronic kidney disease) stage 3, GFR 30-59 ml/min (HCC)    Will update renal function next time. SGLT2i was unaffordable.       Type 2 diabetes mellitus with diabetic chronic kidney disease (HCC)    Chronic, stable period followed by endo, latest A1c 8.2% last month.  Appreciate endo care.  He forgot CGM monitor today so unable to review readings.       Diabetic neuropathy (HCC)    Recently started on gabapentin 100mg  nightly through endo      CLL (chronic lymphocytic leukemia) (HCC)    Appreciate onc care.       Exocrine pancreatic insufficiency    Continues creon with benefit.        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   No orders of the defined types were placed in this encounter.   Patient Instructions  Good to see you today You are doing well today  Return as needed or in 6 months for medicare wellness visit   Follow up plan: Return in about 6 months (around 07/06/2023) for medicare wellness visit.  Eustaquio Boyden, MD

## 2023-01-06 NOTE — Telephone Encounter (Signed)
Patient creon patient assistance is expiring 03/04/2023 and will need a new application sent in for 2025. Patient is also due for a follow up appointment with Inetta Fermo or Dr. Allegra Lai if he is still taking the medication. Filled out application form just need appointment to be made and patient to come and sign his portion of the form. Call patient but unable to leave a message because voicemail is full

## 2023-01-07 NOTE — Telephone Encounter (Signed)
Patient made follow up appointment with our PA tina on 02/11/2023 will sign application form at appointment and fax in

## 2023-01-13 ENCOUNTER — Other Ambulatory Visit: Payer: Self-pay | Admitting: Family Medicine

## 2023-01-13 DIAGNOSIS — M545 Low back pain, unspecified: Secondary | ICD-10-CM

## 2023-01-13 DIAGNOSIS — R42 Dizziness and giddiness: Secondary | ICD-10-CM

## 2023-01-13 NOTE — Telephone Encounter (Signed)
ERx 

## 2023-01-23 DIAGNOSIS — Z961 Presence of intraocular lens: Secondary | ICD-10-CM | POA: Diagnosis not present

## 2023-01-23 DIAGNOSIS — H43822 Vitreomacular adhesion, left eye: Secondary | ICD-10-CM | POA: Diagnosis not present

## 2023-02-04 ENCOUNTER — Other Ambulatory Visit: Payer: Self-pay | Admitting: Internal Medicine

## 2023-02-11 ENCOUNTER — Encounter: Payer: Self-pay | Admitting: Physician Assistant

## 2023-02-11 ENCOUNTER — Other Ambulatory Visit: Payer: Self-pay

## 2023-02-11 ENCOUNTER — Ambulatory Visit (INDEPENDENT_AMBULATORY_CARE_PROVIDER_SITE_OTHER): Payer: Medicare Other | Admitting: Physician Assistant

## 2023-02-11 VITALS — BP 150/74 | HR 66 | Temp 97.7°F | Ht 71.0 in | Wt 146.6 lb

## 2023-02-11 DIAGNOSIS — H35372 Puckering of macula, left eye: Secondary | ICD-10-CM | POA: Diagnosis not present

## 2023-02-11 DIAGNOSIS — Z961 Presence of intraocular lens: Secondary | ICD-10-CM | POA: Diagnosis not present

## 2023-02-11 DIAGNOSIS — K8681 Exocrine pancreatic insufficiency: Secondary | ICD-10-CM

## 2023-02-11 DIAGNOSIS — H33192 Other retinoschisis and retinal cysts, left eye: Secondary | ICD-10-CM | POA: Diagnosis not present

## 2023-02-11 DIAGNOSIS — E119 Type 2 diabetes mellitus without complications: Secondary | ICD-10-CM | POA: Diagnosis not present

## 2023-02-11 NOTE — Progress Notes (Signed)
Celso Amy, PA-C [address]  [address]  [place], [place] [address]  Main: [phone]  Fax: [phone]   Primary Care Physician: Eustaquio Boyden, MD  Primary Gastroenterologist:  Celso Amy, PA-C / Dr. Lannette Donath    CC: F/U EPI, Creon  HPI: Scott [name] is a 82 y.o. male returns for annual f/u exocrine pancreatic insufficiency.  He is currently taking Creon 36000 lipase units, 2 w/ each meal and 1 w/ each snack.  On Creon his GI symptoms have greatly improved.  He has no more diarrhea, bloating, or abdominal pain.  He is feeling good with no GI symptoms or concerns.  Needs medication refill through medication assistance program.  Current Outpatient Medications  Medication Sig Dispense Refill   acetaminophen (TYLENOL) 650 MG CR tablet Take 1,300 mg by mouth 2 (two) times daily.     amLODipine (NORVASC) 10 MG tablet Take 1 tablet (10 mg total) by mouth daily. 90 tablet 4   atorvastatin (LIPITOR) 80 MG tablet TAKE 1 TABLET BY MOUTH ON MONDAY, WEDNESDAY AND FRIDAY 39 tablet 4   azithromycin (ZITHROMAX) 250 MG tablet 2 tab po x 1 day then 1 tab po daily 6 tablet 0   carvedilol (COREG) 6.25 MG tablet Take 1 tablet (6.25 mg total) by mouth 2 (two) times daily with a meal. 180 tablet 4   Continuous Glucose Sensor (FREESTYLE LIBRE 2 SENSOR) MISC 1 each by Does not apply route every 14 (fourteen) days. 6 each 3   ezetimibe (ZETIA) 10 MG tablet Take 1 tablet (10 mg total) by mouth daily. 30 tablet 11   gabapentin (NEURONTIN) 100 MG capsule Take 1 capsule (100 mg total) by mouth at bedtime. 90 capsule 3   hydrochlorothiazide (MICROZIDE) 12.5 MG capsule Take 12.5 mg by mouth daily.     insulin glargine (LANTUS) 100 UNIT/ML Solostar Pen Inject 10 Units into the skin daily. 15 mL 3   Insulin Pen Needle 32G X 4 MM MISC 1 Device by Does not apply route in the morning, at noon, in the evening, and at bedtime. 400 each 3   levothyroxine (SYNTHROID) 88 MCG tablet Take 1 tablet (88  mcg total) by mouth daily. 90 tablet 3   lipase/protease/amylase (CREON) 36000 UNITS CPEP capsule Take 2 capsules with the first bite of each meal and 1 capsule with the first bite of each snack 240 capsule 11   meclizine (ANTIVERT) 12.5 MG tablet TAKE 1 TABLET BY MOUTH TWICE DAILY AS NEEDED FOR  DIZZINESS  (SEDATION  PRECAUTIONS) 20 tablet 0   nitroGLYCERIN (NITROSTAT) 0.4 MG SL tablet Place 1 tablet (0.4 mg total) under the tongue every 5 (five) minutes as needed for chest pain ((MAX of 3 doses)). 25 tablet 0   NOVOLOG FLEXPEN 100 UNIT/ML FlexPen MAX DAILY 20 UNITS PER SCALE 15 mL 2   ondansetron (ZOFRAN-ODT) 4 MG disintegrating tablet Take 1 tablet (4 mg total) by mouth every 8 (eight) hours as needed for nausea or vomiting. 12 tablet 0   oxymetazoline (AFRIN) 0.05 % nasal spray Place 1 spray into left nostril daily as needed (nose bleeds).     traMADol (ULTRAM) 50 MG tablet TAKE 1 TABLET BY MOUTH EVERY 8 HOURS AS NEEDED 20 tablet 0   traZODone (DESYREL) 100 MG tablet Take 1 tablet (100 mg total) by mouth at bedtime as needed. for sleep 90 tablet 4   triamcinolone ointment (KENALOG) 0.5 % Apply 1 application topically 2 (two) times daily. 30 g 1  No current facility-administered medications for this visit.   Facility-Administered Medications Ordered in Other Visits  Medication Dose Route Frequency Provider Last Rate Last Admin   sodium chloride flush (NS) 0.9 % injection 10 mL  10 mL Intravenous Once Jeralyn Ruths, MD        Allergies as of 02/11/2023 - Review Complete 02/11/2023  Allergen Reaction Noted   Lisinopril Other (See Comments) 05/16/2021   Prednisone Other (See Comments) 12/10/2021    Past Medical History:  Diagnosis Date   Acquired hand deformity 1962   hand saw accident at work   Anemia 10/11/2011   Arthritis    Bradycardia 10/10/2011   CAD (coronary artery disease) 10/10/2011   MI s/p PTCA (Dx-OM2 proximal concentric stenosis)   Carotid artery disease (HCC)     Cellulitis of buttock, left 11/17/2018   Chronic edema    CKD (chronic kidney disease) stage 3, GFR 30-59 ml/min (HCC)    [name]   CLL (chronic lymphocytic leukemia) (HCC)    Colon polyps    Cough 04/28/2019   Dysphagia    EGD - reflux esophagitis, one area of stomach with focal intestinal metaplasia (06/2019) Dr Allegra Lai   Generalized headaches    frequent   GI bleed 07/08/2017   Glaucoma    s/p laser surgery   HLD (hyperlipidemia)    Hyperkalemia 02/28/2018   Hypertension    Hypothyroidism 10/10/2011   Iron deficiency anemia due to chronic blood loss    Squamous cell carcinoma of skin 06/15/2019   Right posterior ear. SCCis, hypertrophic, crusted   Squamous cell carcinoma of skin 09/26/2022   Left mid helix, mohs 10/31/22. Moderately differentiated, adenoid variant, crusted, deep margin involved.   Sternal fracture 09/13/2017   Thrombocytopenia (HCC) 10/11/2011   Type 2 diabetes with nephropathy [facility])    DM refresher course [facility] (04/2013)    Past Surgical History:  Procedure Laterality Date   BACK SURGERY     cervical neck   CATARACT EXTRACTION, BILATERAL Bilateral 2017   COLONOSCOPY  11/2008   1 polyp, diverticulosis, rec rpt 5 yrs (Dr. Randa Evens, [place] Sprang)   COLONOSCOPY  06/2014   hyperplastic polyp, rpt 5 yrs Randa Evens)   COLONOSCOPY WITH PROPOFOL N/A 11/17/2017   TA, HP, (Vanga, Loel Dubonnet, MD)   ESOPHAGOGASTRODUODENOSCOPY (EGD) WITH PROPOFOL N/A 11/17/2017   healing erosive gastritis, intestinal metaplasia, neg H pylori (Vanga, Loel Dubonnet, MD)   ESOPHAGOGASTRODUODENOSCOPY (EGD) WITH PROPOFOL N/A 06/28/2019   erosive gastropathy with stigmata of recent bleed, granular tissue in esophagus biopsied- reflux esophagitis, small HH (Vanga, Loel Dubonnet, MD)   EYE SURGERY  2012   laser surgery for glaucoma   MOHS SURGERY Left 10/2022   L ear mid helix squamous cell cancer   PORTACATH PLACEMENT Left 12/09/2018   Procedure: INSERTION PORT-A-CATH;  Surgeon: Henrene Dodge,  MD;  Location: [facility] ORS;  Service: General;  Laterality: Left;   PTCA  1994, 1995   US ECHOCARDIOGRAPHY  10/2013   inferior wall hypokinesis, mild LVH, EF 50-55%, mild MR and LA dilation    Review of Systems:    All systems reviewed and negative except where noted in HPI.   Physical Examination:   BP (!) 150/74   Pulse 66   Temp 97.7 F (36.5 C)   Ht 5\' 11"  (1.803 m)   Wt 146 lb 9.6 oz (66.5 kg)   BMI 20.45 kg/m   General: Well-nourished, well-developed in no acute distress.  Lungs: Clear to auscultation bilaterally. Non-labored. Heart: Regular rate  and rhythm, no murmurs rubs or gallops.  Abdomen: Bowel sounds are normal; Abdomen is Soft; No hepatosplenomegaly, masses or hernias;  No Abdominal Tenderness; No guarding or rebound tenderness. Neuro: Alert and oriented x 3.  Grossly intact.  Psych: Alert and cooperative, normal mood and affect.   Imaging Studies: No results found.  Assessment and Plan:   ARDA CESAR is a 82 y.o. y/o male returns for F/U:  Exocrine Pancreatic Insufficiency - Controlled on Creon  Continue Creon 36,000 lipase units, 2 with each meal and 1 with each snack.  Celso Amy, PA-C  Follow up in 1 year or sooner if he develops GI symptoms.

## 2023-03-07 ENCOUNTER — Other Ambulatory Visit: Payer: Self-pay | Admitting: Internal Medicine

## 2023-03-10 NOTE — Telephone Encounter (Signed)
 Patient has been approved till 03/03/2024

## 2023-03-18 DIAGNOSIS — H02833 Dermatochalasis of right eye, unspecified eyelid: Secondary | ICD-10-CM | POA: Diagnosis not present

## 2023-03-18 DIAGNOSIS — H33192 Other retinoschisis and retinal cysts, left eye: Secondary | ICD-10-CM | POA: Diagnosis not present

## 2023-03-18 DIAGNOSIS — H35372 Puckering of macula, left eye: Secondary | ICD-10-CM | POA: Diagnosis not present

## 2023-03-18 DIAGNOSIS — E119 Type 2 diabetes mellitus without complications: Secondary | ICD-10-CM | POA: Diagnosis not present

## 2023-03-18 DIAGNOSIS — H02836 Dermatochalasis of left eye, unspecified eyelid: Secondary | ICD-10-CM | POA: Diagnosis not present

## 2023-03-18 LAB — HM DIABETES EYE EXAM

## 2023-04-07 ENCOUNTER — Other Ambulatory Visit: Payer: Self-pay | Admitting: Family Medicine

## 2023-04-07 DIAGNOSIS — R42 Dizziness and giddiness: Secondary | ICD-10-CM

## 2023-04-07 NOTE — Telephone Encounter (Signed)
Meclizine Last filled:  01/13/23, #20 Last OV:  01/06/23, 6 mo HTN f/u Next OV:  none

## 2023-04-09 ENCOUNTER — Encounter: Payer: Self-pay | Admitting: Internal Medicine

## 2023-04-09 ENCOUNTER — Ambulatory Visit: Payer: Medicare Other | Admitting: Internal Medicine

## 2023-04-09 VITALS — BP 128/80 | HR 74 | Ht 71.0 in | Wt 148.0 lb

## 2023-04-09 DIAGNOSIS — G63 Polyneuropathy in diseases classified elsewhere: Secondary | ICD-10-CM

## 2023-04-09 DIAGNOSIS — E1142 Type 2 diabetes mellitus with diabetic polyneuropathy: Secondary | ICD-10-CM | POA: Diagnosis not present

## 2023-04-09 DIAGNOSIS — Z794 Long term (current) use of insulin: Secondary | ICD-10-CM

## 2023-04-09 DIAGNOSIS — N1832 Chronic kidney disease, stage 3b: Secondary | ICD-10-CM

## 2023-04-09 DIAGNOSIS — E1122 Type 2 diabetes mellitus with diabetic chronic kidney disease: Secondary | ICD-10-CM

## 2023-04-09 DIAGNOSIS — E1165 Type 2 diabetes mellitus with hyperglycemia: Secondary | ICD-10-CM

## 2023-04-09 LAB — POCT GLYCOSYLATED HEMOGLOBIN (HGB A1C): Hemoglobin A1C: 8.5 % — AB (ref 4.0–5.6)

## 2023-04-09 NOTE — Patient Instructions (Addendum)
 Continue Lantus  10 units daily  Novolog  5 units with Breakfast and 5 units with Supper  Novolog  correctional insulin : ADD extra units on insulin  to your meal-time Novolog  dose if your blood sugars are higher than 200. Use the scale below to help guide you:   Blood sugar before meal Number of units to inject  Less than 185 0 unit  186 - 240 1 units  241- 295 2 units  296 - 350 3 units  351 - 405 4 units  406 - 460 5 units       HOW TO TREAT LOW BLOOD SUGARS (Blood sugar LESS THAN 70 MG/DL) Please follow the RULE OF 15 for the treatment of hypoglycemia treatment (when your (blood sugars are less than 70 mg/dL)   STEP 1: Take 15 grams of carbohydrates when your blood sugar is low, which includes:  3-4 GLUCOSE TABS  OR 3-4 OZ OF JUICE OR REGULAR SODA OR ONE TUBE OF GLUCOSE GEL    STEP 2: RECHECK blood sugar in 15 MINUTES STEP 3: If your blood sugar is still low at the 15 minute recheck --> then, go back to STEP 1 and treat AGAIN with another 15 grams of carbohydrates.
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
## 2023-05-11 ENCOUNTER — Other Ambulatory Visit: Payer: Self-pay | Admitting: Family Medicine

## 2023-05-11 DIAGNOSIS — G8929 Other chronic pain: Secondary | ICD-10-CM

## 2023-05-12 NOTE — Telephone Encounter (Signed)
 Name of Medication:  Tramadol Name of Pharmacy:  [facility] Last Fill or Written Date and Quantity:  01/13/23, #20 Last Office Visit and Type:  01/06/23, 6 mo HTN f/u Next Office Visit and Type:  none Last Controlled Substance Agreement Date:  09/28/12 Last UDS:  09/28/12

## 2023-05-12 NOTE — Telephone Encounter (Signed)
 ERx

## 2023-05-19 ENCOUNTER — Encounter: Payer: Self-pay | Admitting: Dermatology

## 2023-05-19 ENCOUNTER — Ambulatory Visit: Payer: Medicare Other | Admitting: Dermatology

## 2023-05-19 DIAGNOSIS — Z872 Personal history of diseases of the skin and subcutaneous tissue: Secondary | ICD-10-CM | POA: Diagnosis not present

## 2023-05-19 DIAGNOSIS — W908XXA Exposure to other nonionizing radiation, initial encounter: Secondary | ICD-10-CM

## 2023-05-19 DIAGNOSIS — Z85828 Personal history of other malignant neoplasm of skin: Secondary | ICD-10-CM | POA: Diagnosis not present

## 2023-05-19 DIAGNOSIS — L578 Other skin changes due to chronic exposure to nonionizing radiation: Secondary | ICD-10-CM

## 2023-05-19 DIAGNOSIS — L570 Actinic keratosis: Secondary | ICD-10-CM

## 2023-05-19 DIAGNOSIS — D492 Neoplasm of unspecified behavior of bone, soft tissue, and skin: Secondary | ICD-10-CM

## 2023-05-19 DIAGNOSIS — L814 Other melanin hyperpigmentation: Secondary | ICD-10-CM

## 2023-05-19 DIAGNOSIS — Z1283 Encounter for screening for malignant neoplasm of skin: Secondary | ICD-10-CM

## 2023-05-19 DIAGNOSIS — D1801 Hemangioma of skin and subcutaneous tissue: Secondary | ICD-10-CM | POA: Diagnosis not present

## 2023-05-19 DIAGNOSIS — D229 Melanocytic nevi, unspecified: Secondary | ICD-10-CM

## 2023-05-19 DIAGNOSIS — L821 Other seborrheic keratosis: Secondary | ICD-10-CM | POA: Diagnosis not present

## 2023-05-19 DIAGNOSIS — Z86007 Personal history of in-situ neoplasm of skin: Secondary | ICD-10-CM
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
## 2023-05-19 NOTE — Progress Notes (Signed)
 Follow-Up Visit   Subjective  Scott [name] is a 83 y.o. male who presents for the following: Skin Cancer Screening and Upper Body Skin Exam. Hx of SCCs, Hx of AKs.  C/O scaly areas on ears.   Patient defers TBSE today. Has trouble getting cloths off and on.   The patient presents for Upper Body Skin Exam (UBSE) for skin cancer screening and mole check. The patient has spots, moles and lesions to be evaluated, some may be new or changing and the patient may have concern these could be cancer.    The following portions of the chart were reviewed this encounter and updated as appropriate: medications, allergies, medical history  Review of Systems:  No other skin or systemic complaints except as noted in HPI or Assessment and Plan.  Objective  Well appearing patient in no apparent distress; mood and affect are within normal limits.  All skin waist up examined. Relevant physical exam findings are noted in the Assessment and Plan.  Right Neck - Posterior 5 mm pigmented macule   left helical rim, left scapha, left zygoma, right zygoma, right sup scapha (5) Erythematous thin papules/macules with gritty scale.   Assessment & Plan   NEOPLASM OF SKIN Right Neck - Posterior Epidermal / dermal shaving  Lesion diameter (cm):  0.5 Informed consent: discussed and consent obtained   Timeout: patient name, date of birth, surgical site, and procedure verified   Procedure prep:  Patient was prepped and draped in usual sterile fashion Prep type:  Povidone-iodine and isopropyl alcohol Anesthesia: the lesion was anesthetized in a standard fashion   Anesthetic:  1% lidocaine w/ epinephrine 1-100,000 buffered w/ 8.4% NaHCO3 Instrument used: DermaBlade   Hemostasis achieved with: pressure and aluminum chloride   Outcome: patient tolerated procedure well   Post-procedure details: wound care instructions given   Specimen 1 - Surgical pathology Differential Diagnosis: Dysplastic nevus, R/O  Melanoma   Check Margins: Yes AK (ACTINIC KERATOSIS) (5) left helical rim, left scapha, left zygoma, right zygoma, right sup scapha (5) Actinic keratoses are precancerous spots that appear secondary to cumulative UV radiation exposure/sun exposure over time. They are chronic with expected duration over 1 year. A portion of actinic keratoses will progress to squamous cell carcinoma of the skin. It is not possible to reliably predict which spots will progress to skin cancer and so treatment is recommended to prevent development of skin cancer.  Recommend daily broad spectrum sunscreen SPF 30+ to sun-exposed areas, reapply every 2 hours as needed.  Recommend staying in the shade or wearing long sleeves, sun glasses (UVA+UVB protection) and wide brim hats (4-inch brim around the entire circumference of the hat). Call for new or changing lesions. Destruction of lesion - left helical rim, left scapha, left zygoma, right zygoma, right sup scapha (5) Complexity: simple   Destruction method: cryotherapy   Informed consent: discussed and consent obtained   Timeout:  patient name, date of birth, surgical site, and procedure verified Lesion destroyed using liquid nitrogen: Yes   Region frozen until ice ball extended beyond lesion: Yes   Cryo cycles: 1 or 2. Outcome: patient tolerated procedure well with no complications   Post-procedure details: wound care instructions given   Additional details:  Prior to procedure, discussed risks of blister formation, small wound, skin dyspigmentation, or rare scar following cryotherapy. Recommend Vaseline ointment to treated areas while healing.  MULTIPLE BENIGN NEVI   LENTIGINES   ACTINIC ELASTOSIS   SEBORRHEIC KERATOSES   [name] ANGIOMA  Skin cancer screening performed today.  HISTORY OF SQUAMOUS CELL CARCINOMA IN SITU OF THE SKIN. Right posterior ear, 06/15/2019.  - No evidence of recurrence today - Recommend regular full body skin exams -  Recommend daily broad spectrum sunscreen SPF 30+ to sun-exposed areas, reapply every 2 hours as needed.  - Call if any new or changing lesions are noted between office visits    HISTORY OF SQUAMOUS CELL CARCINOMA OF THE SKIN. Left mid helix. Mohs 10/31/2022. - No evidence of recurrence today - No lymphadenopathy - Recommend regular full body skin exams - Recommend daily broad spectrum sunscreen SPF 30+ to sun-exposed areas, reapply every 2 hours as needed.  - Call if any new or changing lesions are noted between office visits  Actinic Damage - Chronic condition, secondary to cumulative UV/sun exposure - diffuse scaly erythematous macules with underlying dyspigmentation - Recommend daily broad spectrum sunscreen SPF 30+ to sun-exposed areas, reapply every 2 hours as needed.  - Staying in the shade or wearing long sleeves, sun glasses (UVA+UVB protection) and wide brim hats (4-inch brim around the entire circumference of the hat) are also recommended for sun protection.  - Call for new or changing lesions.  Lentigines, Seborrheic Keratoses, Hemangiomas - Benign normal skin lesions - Benign-appearing - Call for any changes  Melanocytic Nevi - Tan-brown and/or pink-flesh-colored symmetric macules and papules - Benign appearing on exam today - Observation - Call clinic for new or changing moles - Recommend daily use of broad spectrum spf 30+ sunscreen to sun-exposed areas.     Return in about 6 months (around 11/19/2023) for TBSE, HxSCC.  I, Lawson Radar, CMA, am acting as scribe for Elie Goody, MD.   Documentation: I have reviewed the above documentation for accuracy and completeness, and I agree with the above.  Elie Goody, MD

## 2023-05-19 NOTE — Patient Instructions (Signed)
 Cryotherapy Aftercare  Wash gently with soap and water everyday.   Apply Vaseline Jelly daily until healed.    Wound Care Instructions  Cleanse wound gently with soap and water once a day then pat dry with clean gauze. Apply a thin coat of Petrolatum (petroleum jelly, "Vaseline") over the wound (unless you have an allergy to this). We recommend that you use a new, sterile tube of Vaseline. Do not pick or remove scabs. Do not remove the yellow or white "healing tissue" from the base of the wound.  Cover the wound with fresh, clean, nonstick gauze and secure with paper tape. You may use Band-Aids in place of gauze and tape if the wound is small enough, but would recommend trimming much of the tape off as there is often too much. Sometimes Band-Aids can irritate the skin.  You should call the office for your biopsy report after 1 week if you have not already been contacted.  If you experience any problems, such as abnormal amounts of bleeding, swelling, significant bruising, significant pain, or evidence of infection, please call the office immediately.  FOR ADULT SURGERY PATIENTS: If you need something for pain relief you may take 1 extra strength Tylenol (acetaminophen) AND 2 Ibuprofen (200mg  each) together every 4 hours as needed for pain. (do not take these if you are allergic to them or if you have a reason you should not take them.) Typically, you may only need pain medication for 1 to 3 days.      Recommend daily broad spectrum sunscreen SPF 30+ to sun-exposed areas, reapply every 2 hours as needed. Call for new or changing lesions.  Staying in the shade or wearing long sleeves, sun glasses (UVA+UVB protection) and wide brim hats (4-inch brim around the entire circumference of the hat) are also recommended for sun protection.    Melanoma ABCDEs  Melanoma is the most dangerous type of skin cancer, and is the leading cause of death from skin disease.  You are more likely to develop  melanoma if you: Have light-colored skin, light-colored eyes, or red or blond hair Spend a lot of time in the sun Tan regularly, either outdoors or in a tanning bed Have had blistering sunburns, especially during childhood Have a close family member who has had a melanoma Have atypical moles or large birthmarks  Early detection of melanoma is key since treatment is typically straightforward and cure rates are extremely high if we catch it early.   The first sign of melanoma is often a change in a mole or a new dark spot.  The ABCDE system is a way of remembering the signs of melanoma.  A for asymmetry:  The two halves do not match. B for border:  The edges of the growth are irregular. C for color:  A mixture of colors are present instead of an even brown color. D for diameter:  Melanomas are usually (but not always) greater than 6mm - the size of a pencil eraser. E for evolution:  The spot keeps changing in size, shape, and color.  Please check your skin once per month between visits. You can use a small mirror in front and a large mirror behind you to keep an eye on the back side or your body.   If you see any new or changing lesions before your next follow-up, please call to schedule a visit.  Please continue daily skin protection including broad spectrum sunscreen SPF 30+ to sun-exposed areas, reapplying every 2 hours  as needed when you're outdoors.   Staying in the shade or wearing long sleeves, sun glasses (UVA+UVB protection) and wide brim hats (4-inch brim around the entire circumference of the hat) are also recommended for sun protection.     Due to recent changes in healthcare laws, you may see results of your pathology and/or laboratory studies on MyChart before the doctors have had a chance to review them. We understand that in some cases there may be results that are confusing or concerning to you. Please understand that not all results are received at the same time and often  the doctors may need to interpret multiple results in order to provide you with the best plan of care or course of treatment. Therefore, we ask that you please give [place] 2 business days to thoroughly review all your results before contacting the office for clarification. Should we see a critical lab result, you will be contacted sooner.   If You Need Anything After Your Visit  If you have any questions or concerns for your doctor, please call our main line at [phone] and press option 4 to reach your doctor's medical assistant. If no one answers, please leave a voicemail as directed and we will return your call as soon as possible. Messages left after 4 pm will be answered the following business day.   You may also send [place] a message via MyChart. We typically respond to MyChart messages within 1-2 business days.  For prescription refills, please ask your pharmacy to contact our office. Our fax number is [phone].  If you have an urgent issue when the clinic is closed that cannot wait until the next business day, you can page your doctor at the number below.    Please note that while we do our best to be available for urgent issues outside of office hours, we are not available 24/7.   If you have an urgent issue and are unable to reach [place], you may choose to seek medical care at your doctor's office, retail clinic, urgent care center, or emergency room.  If you have a medical emergency, please immediately call 911 or go to the emergency department.  Pager Numbers  - Dr. Gwen Pounds: [phone]  - Dr. Roseanne Reno: [phone]  - Dr. Katrinka Blazing: [phone]   In the event of inclement weather, please call our main line at [phone] for an update on the status of any delays or closures.  Dermatology Medication Tips: Please keep the boxes that topical medications come in in order to help keep track of the instructions about where and how to use these. Pharmacies typically print the medication  instructions only on the boxes and not directly on the medication tubes.   If your medication is too expensive, please contact our office at [phone] option 4 or send [place] a message through MyChart.   We are unable to tell what your co-pay for medications will be in advance as this is different depending on your insurance coverage. However, we may be able to find a substitute medication at lower cost or fill out paperwork to get insurance to cover a needed medication.   If a prior authorization is required to get your medication covered by your insurance company, please allow [place] 1-2 business days to complete this process.  Drug prices often vary depending on where the prescription is filled and some pharmacies may offer cheaper prices.  The website www.goodrx.com contains coupons for medications through different pharmacies. The prices here do not account  for what the cost may be with help from insurance (it may be cheaper with your insurance), but the website can give you the price if you did not use any insurance.  - You can print the associated coupon and take it with your prescription to the pharmacy.  - You may also stop by our office during regular business hours and pick up a GoodRx coupon card.  - If you need your prescription sent electronically to a different pharmacy, notify our office through [name] or by phone at [phone] option 4.     Si Usted Necesita Algo Despus de Su Visita  Tambin puede enviarnos un mensaje a travs de Clinical cytogeneticist. Por lo general respondemos a los mensajes de MyChart en el transcurso de 1 a 2 das hbiles.  Para renovar recetas, por favor pida a su farmacia que se ponga en contacto con nuestra oficina. Annie Sable de fax es [place] [phone].  Si tiene un asunto urgente cuando la clnica est cerrada y que no puede esperar hasta el siguiente da hbil, puede llamar/localizar a su doctor(a) al nmero que aparece a continuacin.   Por  favor, tenga en cuenta que aunque hacemos todo lo posible para estar disponibles para asuntos urgentes fuera del horario de [place], no estamos disponibles las 24 horas del da, los 7 [address]  [address] de la [place].   Si tiene un problema urgente y no puede comunicarse con nosotros, puede optar por buscar atencin mdica  en el consultorio de su doctor(a), en una clnica privada, en un centro de atencin urgente o en una sala de emergencias.  Si tiene Engineer, drilling, por favor llame inmediatamente al 911 o vaya a la sala de emergencias.  Nmeros de bper  - Dr. Gwen Pounds: [phone]  - Dra. Roseanne Reno: [phone]  - Dr. Katrinka Blazing: [phone]   En caso de inclemencias del tiempo, por favor llame a Lacy Duverney principal al [phone] para una actualizacin sobre el [place] de cualquier retraso o cierre.  Consejos para la medicacin en dermatologa: Por favor, guarde las cajas en las que vienen los medicamentos de uso tpico para ayudarle a seguir las instrucciones sobre dnde y cmo usarlos. Las farmacias generalmente imprimen las instrucciones del medicamento slo en las cajas y no directamente en los tubos del [place].   Si su medicamento es muy caro, por favor, pngase en contacto con Rolm Gala llamando al [phone] y presione la opcin 4 o envenos un mensaje a travs de Clinical cytogeneticist.   No podemos decirle cul ser su copago por los medicamentos por adelantado ya que esto es diferente dependiendo de la cobertura de su seguro. Sin embargo, es posible que podamos encontrar un medicamento sustituto a Audiological scientist un formulario para que el seguro cubra el medicamento que se considera necesario.   Si se requiere una autorizacin previa para que su compaa de seguros [place] su medicamento, por favor permtanos de 1 a 2 das hbiles para completar [address]  Los precios de los medicamentos varan con frecuencia dependiendo del Environmental consultant de dnde se surte la receta y alguna farmacias  pueden ofrecer precios ms baratos.  El sitio web www.goodrx.com tiene cupones para medicamentos de Health and safety inspector. Los precios aqu no tienen en cuenta lo que podra costar con la ayuda del seguro (puede ser ms barato con su seguro), pero el sitio web puede darle el precio si no utiliz Tourist information centre manager.  - Puede imprimir el cupn correspondiente y llevarlo con su receta a la farmacia.  -  Tambin puede pasar por nuestra oficina durante el horario de atencin regular y Education officer, museum una tarjeta de cupones de GoodRx.  - Si necesita que su receta se enve electrnicamente a una farmacia diferente, informe a nuestra oficina a travs de MyChart de [place] o por telfono llamando al [phone] y presione la opcin 4.

## 2023-05-20 ENCOUNTER — Other Ambulatory Visit: Payer: Self-pay | Admitting: *Deleted

## 2023-05-20 DIAGNOSIS — D696 Thrombocytopenia, unspecified: Secondary | ICD-10-CM

## 2023-05-20 DIAGNOSIS — C9110 Chronic lymphocytic leukemia of B-cell type not having achieved remission: Secondary | ICD-10-CM

## 2023-05-21 ENCOUNTER — Inpatient Hospital Stay: Payer: Medicare Other | Attending: Oncology

## 2023-05-21 ENCOUNTER — Encounter: Payer: Self-pay | Admitting: Dermatology

## 2023-05-21 ENCOUNTER — Inpatient Hospital Stay (HOSPITAL_BASED_OUTPATIENT_CLINIC_OR_DEPARTMENT_OTHER): Payer: Medicare Other | Admitting: Oncology

## 2023-05-21 ENCOUNTER — Encounter: Payer: Self-pay | Admitting: Oncology

## 2023-05-21 ENCOUNTER — Telehealth: Payer: Self-pay

## 2023-05-21 VITALS — BP 146/72 | HR 65 | Temp 96.7°F | Resp 16 | Wt 146.1 lb

## 2023-05-21 DIAGNOSIS — C9110 Chronic lymphocytic leukemia of B-cell type not having achieved remission: Secondary | ICD-10-CM

## 2023-05-21 DIAGNOSIS — Z87891 Personal history of nicotine dependence: Secondary | ICD-10-CM | POA: Diagnosis not present

## 2023-05-21 DIAGNOSIS — C9111 Chronic lymphocytic leukemia of B-cell type in remission: Secondary | ICD-10-CM | POA: Insufficient documentation

## 2023-05-21 DIAGNOSIS — Z862 Personal history of diseases of the blood and blood-forming organs and certain disorders involving the immune mechanism: Secondary | ICD-10-CM | POA: Insufficient documentation

## 2023-05-21 DIAGNOSIS — Z85828 Personal history of other malignant neoplasm of skin: Secondary | ICD-10-CM | POA: Insufficient documentation

## 2023-05-21 DIAGNOSIS — Z800 Family history of malignant neoplasm of digestive organs: Secondary | ICD-10-CM | POA: Diagnosis not present

## 2023-05-21 DIAGNOSIS — D696 Thrombocytopenia, unspecified: Secondary | ICD-10-CM

## 2023-05-21 DIAGNOSIS — D693 Immune thrombocytopenic purpura: Secondary | ICD-10-CM | POA: Insufficient documentation

## 2023-05-21 LAB — CBC WITH DIFFERENTIAL/PLATELET
Abs Immature Granulocytes: 0.01 10*3/uL (ref 0.00–0.07)
Basophils Absolute: 0 10*3/uL (ref 0.0–0.1)
Basophils Relative: 0 %
Eosinophils Absolute: 0.1 10*3/uL (ref 0.0–0.5)
Eosinophils Relative: 2 %
HCT: 38.9 % — ABNORMAL LOW (ref 39.0–52.0)
Hemoglobin: 12.4 g/dL — ABNORMAL LOW (ref 13.0–17.0)
Immature Granulocytes: 0 %
Lymphocytes Relative: 32 %
Lymphs Abs: 1.5 10*3/uL (ref 0.7–4.0)
MCH: 29.7 pg (ref 26.0–34.0)
MCHC: 31.9 g/dL (ref 30.0–36.0)
MCV: 93.1 fL (ref 80.0–100.0)
Monocytes Absolute: 0.4 10*3/uL (ref 0.1–1.0)
Monocytes Relative: 7 %
Neutro Abs: 2.7 10*3/uL (ref 1.7–7.7)
Neutrophils Relative %: 59 %
Platelets: 152 10*3/uL (ref 150–400)
RBC: 4.18 MIL/uL — ABNORMAL LOW (ref 4.22–5.81)
RDW: 12.2 % (ref 11.5–15.5)
WBC: 4.7 10*3/uL (ref 4.0–10.5)
nRBC: 0 % (ref 0.0–0.2)

## 2023-05-21 LAB — SURGICAL PATHOLOGY

## 2023-05-21 NOTE — Telephone Encounter (Signed)
 Faxed order to [facility] at [phone].

## 2023-05-21 NOTE — Telephone Encounter (Signed)
 Received faxed CGM Physician's Order from [facility] for CGM supplies.  Spoke with pt asking if he is aware of order. Pt confirms he is aware of order. Notified him we are faxing it in. Pt expresses his thanks.

## 2023-05-21 NOTE — Addendum Note (Signed)
 Addended by: Luz Lex on: 05/21/2023 11:27 AM   Modules accepted: Orders

## 2023-05-21 NOTE — Progress Notes (Signed)
 [facility]  [phone] [phone]) [phone]  ID: Scott [name] OB: [birth year]  MR#: [id number]  ONG#:[id number]  Patient Care Team: Eustaquio Boyden, MD as PCP - General (Family Medicine) Lars Masson, MD as PCP - Cardiology (Cardiology) Jeralyn Ruths, MD as Consulting Physician (Oncology) Ernesto Rutherford, MD as Consulting Physician (Ophthalmology) Lars Masson, MD as Consulting Physician (Cardiology) Bufford Buttner, MD as Consulting Physician (Nephrology) Kathyrn Sheriff, [facility] (Inactive) as Pharmacist (Pharmacist)   CHIEF COMPLAINT: 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  INTERVAL HISTORY: Patient returns to clinic today for repeat laboratory work and routine 38-month evaluation.  He continues to feel well and remains asymptomatic.  He has no neurologic complaints.  He denies any recent fevers or illnesses.  He has a good appetite and denies weight loss.  He denies any night sweats. He has noted no new lymphadenopathy.  He has no chest pain, shortness of breath, cough, or hemoptysis.  He denies any nausea, vomiting, constipation, or diarrhea.  He denies any melena or hematochezia.  He has no urinary complaints.  Patient offers no specific complaints today.  REVIEW OF SYSTEMS:   Review of Systems  Constitutional: Negative.  Negative for fever, malaise/fatigue and weight loss.  HENT: Negative.  Negative for nosebleeds.   Respiratory: Negative.  Negative for cough and shortness of breath.   Cardiovascular: Negative.  Negative for chest pain and leg swelling.  Gastrointestinal: Negative.  Negative for abdominal pain, blood in stool and melena.  Genitourinary: Negative.  Negative for dysuria and hematuria.  Musculoskeletal: Negative.  Negative for back pain and neck pain.  Skin: Negative.  Negative for itching and rash.  Neurological: Negative.  Negative for dizziness, sensory change, focal weakness, weakness and headaches.   Endo/Heme/Allergies: Negative.  Does not bruise/bleed easily.  Psychiatric/Behavioral: Negative.  The patient is not nervous/anxious.     As per HPI. Otherwise, a complete review of systems is negative.  PAST MEDICAL HISTORY: Past Medical History:  Diagnosis Date   Acquired hand deformity 1962   hand saw accident at work   Anemia 10/11/2011   Arthritis    Bradycardia 10/10/2011   CAD (coronary artery disease) 10/10/2011   MI s/p PTCA (Dx-OM2 proximal concentric stenosis)   Carotid artery disease (HCC)    Cellulitis of buttock, left 11/17/2018   Chronic edema    CKD (chronic kidney disease) stage 3, GFR 30-59 ml/min (HCC)    [name]   CLL (chronic lymphocytic leukemia) (HCC)    Colon polyps    Cough 04/28/2019   Dysphagia    EGD - reflux esophagitis, one area of stomach with focal intestinal metaplasia (06/2019) Dr Allegra Lai   Generalized headaches    frequent   GI bleed 07/08/2017   Glaucoma    s/p laser surgery   HLD (hyperlipidemia)    Hyperkalemia 02/28/2018   Hypertension    Hypothyroidism 10/10/2011   Iron deficiency anemia due to chronic blood loss    Squamous cell carcinoma of skin 06/15/2019   Right posterior ear. SCCis, hypertrophic, crusted   Squamous cell carcinoma of skin 09/26/2022   Left mid helix, mohs 10/31/22. Moderately differentiated, adenoid variant, crusted, deep margin involved.   Sternal fracture 09/13/2017   Thrombocytopenia (HCC) 10/11/2011   Type 2 diabetes with nephropathy [facility])    DM refresher course [facility] (04/2013)    PAST SURGICAL HISTORY: Past Surgical History:  Procedure Laterality Date   BACK SURGERY     cervical neck   CATARACT  EXTRACTION, BILATERAL Bilateral 2017   COLONOSCOPY  11/2008   1 polyp, diverticulosis, rec rpt 5 yrs (Dr. Randa Evens, [facility] Sprang)   COLONOSCOPY  06/2014   hyperplastic polyp, rpt 5 yrs Randa Evens)   COLONOSCOPY WITH PROPOFOL N/A 11/17/2017   TA, HP, (Vanga, Loel Dubonnet, MD)   ESOPHAGOGASTRODUODENOSCOPY (EGD) WITH  PROPOFOL N/A 11/17/2017   healing erosive gastritis, intestinal metaplasia, neg H pylori (Vanga, Loel Dubonnet, MD)   ESOPHAGOGASTRODUODENOSCOPY (EGD) WITH PROPOFOL N/A 06/28/2019   erosive gastropathy with stigmata of recent bleed, granular tissue in esophagus biopsied- reflux esophagitis, small HH (Vanga, Loel Dubonnet, MD)   EYE SURGERY  2012   laser surgery for glaucoma   MOHS SURGERY Left 10/2022   L ear mid helix squamous cell cancer   PORTACATH PLACEMENT Left 12/09/2018   Procedure: INSERTION PORT-A-CATH;  Surgeon: Henrene Dodge, MD;  Location: [facility] ORS;  Service: General;  Laterality: Left;   PTCA  1994, 1995   US ECHOCARDIOGRAPHY  10/2013   inferior wall hypokinesis, mild LVH, EF 50-55%, mild MR and LA dilation    FAMILY HISTORY: Family History  Problem Relation Age of Onset   Diabetes Sister    Stomach cancer Sister 65   Pneumonia Father        caused death   Other Brother        no communication with brother so unsure of any health conditions   Coronary artery disease Son 15       5v CABG and stents   Hyperlipidemia Sister    Stroke Neg Hx    Heart attack Neg Hx     ADVANCED DIRECTIVES (Y/N):  N  HEALTH MAINTENANCE: Social History   Tobacco Use   Smoking status: Former    Current packs/day: 0.00    Types: Cigarettes    Quit date: 03/04/1978    Years since quitting: 45.2    Passive exposure: Past   Smokeless tobacco: Never  Vaping Use   Vaping status: Never Used  Substance Use Topics   Alcohol use: No   Drug use: No     Colonoscopy:  PAP:  Bone density:  Lipid panel:  Allergies  Allergen Reactions   Lisinopril Other (See Comments)    Hyperkalemia even at 5mg  dose   Prednisone Other (See Comments)    Marked symptomatic hyperglycemia    Current Outpatient Medications  Medication Sig Dispense Refill   acetaminophen (TYLENOL) 650 MG CR tablet Take 1,300 mg by mouth 2 (two) times daily.     amLODipine (NORVASC) 10 MG tablet Take 1 tablet (10 mg  total) by mouth daily. 90 tablet 4   atorvastatin (LIPITOR) 80 MG tablet TAKE 1 TABLET BY MOUTH ON MONDAY, WEDNESDAY AND FRIDAY 39 tablet 4   carvedilol (COREG) 6.25 MG tablet Take 1 tablet (6.25 mg total) by mouth 2 (two) times daily with a meal. 180 tablet 4   Continuous Glucose Sensor (FREESTYLE LIBRE 2 SENSOR) MISC 1 each by Does not apply route every 14 (fourteen) days. 6 each 3   ezetimibe (ZETIA) 10 MG tablet Take 1 tablet (10 mg total) by mouth daily. 30 tablet 11   gabapentin (NEURONTIN) 100 MG capsule Take 1 capsule (100 mg total) by mouth at bedtime. 90 capsule 3   Insulin Pen Needle 32G X 4 MM MISC 1 Device by Does not apply route in the morning, at noon, in the evening, and at bedtime. 400 each 3   LANTUS SOLOSTAR 100 UNIT/ML Solostar Pen INJECT 12 UNITS SUBCUTANEOUSLY ONCE  DAILY 15 mL 1   levothyroxine (SYNTHROID) 88 MCG tablet Take 1 tablet (88 mcg total) by mouth daily. 90 tablet 3   lipase/protease/amylase (CREON) 36000 UNITS CPEP capsule Take 2 capsules with the first bite of each meal and 1 capsule with the first bite of each snack 240 capsule 11   meclizine (ANTIVERT) 12.5 MG tablet TAKE 1 TABLET BY MOUTH TWICE DAILY AS NEEDED FOR  DIZZINESS  (SEDATION  PRECAUTIONS) 20 tablet 0   nitroGLYCERIN (NITROSTAT) 0.4 MG SL tablet Place 1 tablet (0.4 mg total) under the tongue every 5 (five) minutes as needed for chest pain ((MAX of 3 doses)). 25 tablet 0   NOVOLOG FLEXPEN 100 UNIT/ML FlexPen MAX DAILY 20 UNITS PER SCALE 15 mL 2   ondansetron (ZOFRAN-ODT) 4 MG disintegrating tablet Take 1 tablet (4 mg total) by mouth every 8 (eight) hours as needed for nausea or vomiting. 12 tablet 0   oxymetazoline (AFRIN) 0.05 % nasal spray Place 1 spray into left nostril daily as needed (nose bleeds).     traMADol (ULTRAM) 50 MG tablet TAKE 1 TABLET BY MOUTH EVERY 8 HOURS AS NEEDED 20 tablet 0   traZODone (DESYREL) 100 MG tablet Take 1 tablet (100 mg total) by mouth at bedtime as needed. for sleep 90  tablet 4   triamcinolone ointment (KENALOG) 0.5 % Apply 1 application topically 2 (two) times daily. 30 g 1   No current facility-administered medications for this visit.   Facility-Administered Medications Ordered in Other Visits  Medication Dose Route Frequency Provider Last Rate Last Admin   sodium chloride flush (NS) 0.9 % injection 10 mL  10 mL Intravenous Once Jeralyn Ruths, MD        OBJECTIVE: Vitals:   05/21/23 1037  BP: (!) 146/72  Pulse: 65  Resp: 16  Temp: (!) 96.7 F (35.9 C)  SpO2: 100%     Body mass index is 20.38 kg/m.    ECOG FS:0 - Asymptomatic  General: Well-developed, well-nourished, no acute distress. Eyes: Pink conjunctiva, anicteric sclera. HEENT: Normocephalic, moist mucous membranes. Lungs: No audible wheezing or coughing. Heart: Regular rate and rhythm. Abdomen: Soft, nontender, no obvious distention. Musculoskeletal: No edema, cyanosis, or clubbing. Neuro: Alert, answering all questions appropriately. Cranial nerves grossly intact. Skin: No rashes or petechiae noted. Psych: Normal affect.  LAB RESULTS:  Lab Results  Component Value Date   NA 137 07/13/2022   K 3.8 07/13/2022   CL 105 07/13/2022   CO2 23 07/13/2022   GLUCOSE 214 (H) 07/13/2022   BUN 55 (H) 07/13/2022   CREATININE 1.78 (H) 07/13/2022   CALCIUM 9.1 07/13/2022   PROT 7.0 07/13/2022   ALBUMIN 3.9 07/13/2022   AST 25 07/13/2022   ALT 19 07/13/2022   ALKPHOS 84 07/13/2022   BILITOT 0.8 07/13/2022   GFRNONAA 38 (L) 07/13/2022   GFRAA 42 (L) 10/28/2019    Lab Results  Component Value Date   WBC 4.7 05/21/2023   NEUTROABS 2.7 05/21/2023   HGB 12.4 (L) 05/21/2023   HCT 38.9 (L) 05/21/2023   MCV 93.1 05/21/2023   PLT 152 05/21/2023     STUDIES: No results found.  ONCOLOGY HISTORY: Bone marrow biopsy on Jul 28, 2018 reviewed independently with 73% involvement of CLL.  Previous bone marrow biopsy on October 23, 2017 revealed only 31% involvement.  Previously,  peripheral blood FISH testing 50% incidence of mutation in the ATM gene which is commonly associated with deletion 11q. 11q- is associated with an unfavorable  prognosis and high risk of not responding to initial treatment. 13q- is a more common mutation and is actually associated with a more favorable prognosis. Patient had clear progression of disease in his bone marrow along with a worsening transfusion requirement.  He received 5 weekly cycles of Rituxan with mild improvement of his platelet count.  Patient then received 4 cycles of Rituxan plus Treanda completing treatment on January 12, 2019.   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  PLAN:    CLL: See oncology history as above.  Patient's white blood cell count continues to be within normal limits and he continues to be in what appears to be a complete remission.  Patent does not require treatment at this time.  No intervention is needed.  If patient required retreatment, would consider Rituxan plus Treanda once again.  Return to clinic in 6 months for laboratory work only and then in 1 year for laboratory work and routine evaluation.   Thrombocytopenia: Resolved.  Previously, he received high-dose prednisone, IVIG, and Rituxan with no appreciable durability to improve his platelet count.  Platelets improved upon treatment of his CLL. Anemia: Hemoglobin essentially resolved and is now 12 point.  His last blood transfusion was given on December 03, 2018.  Patient had colonoscopy on November 17, 2017 that removed 2 nonmalignant polyps.  EGD on the same day revealed nonbleeding erosive gastropathy with multiple nonbleeding duodenal ulcers.  Repeat EGD did not reveal significant pathology.   Reaction to Rituxan: Rate-based.  Patient will require premedications for any further infusions with Rituxan.    I spent a total of 20 minutes reviewing chart data, face-to-face evaluation with the patient, counseling and coordination of care  as detailed above.   Patient expressed understanding and was in agreement with this plan. He also understands that He can call clinic at any time with any questions, concerns, or complaints.    Jeralyn Ruths, MD   05/21/2023 11:05 AM

## 2023-05-21 NOTE — Progress Notes (Signed)
 Patient is doing ok, no new questions or concerns for the doctor today. His Systolic BP was 146, which he told me is probably because he just took his BP medication just a little bit ago.

## 2023-05-26 ENCOUNTER — Ambulatory Visit (INDEPENDENT_AMBULATORY_CARE_PROVIDER_SITE_OTHER): Payer: Medicare Other

## 2023-05-26 VITALS — BP 139/72 | Ht 71.0 in | Wt 144.0 lb

## 2023-05-26 DIAGNOSIS — Z0000 Encounter for general adult medical examination without abnormal findings: Secondary | ICD-10-CM | POA: Diagnosis not present

## 2023-05-26 DIAGNOSIS — Z2821 Immunization not carried out because of patient refusal: Secondary | ICD-10-CM

## 2023-05-26 NOTE — Patient Instructions (Signed)
 Mr. Donaho , Thank you for taking time to come for your Medicare Wellness Visit. I appreciate your ongoing commitment to your health goals. Please review the following plan we discussed and let me know if I can assist you in the future.   Referrals/Orders/Follow-Ups/Clinician Recommendations: Please follow up on vaccines due  This is a list of the screening recommended for you and due dates:  Health Maintenance  Topic Date Due   Zoster (Shingles) Vaccine (1 of 2) 06/02/1959   COVID-19 Vaccine (5 - 2024-25 season) 11/03/2022   Yearly kidney health urinalysis for diabetes  06/25/2023   Yearly kidney function blood test for diabetes  07/13/2023   Complete foot exam   08/26/2023   Hemoglobin A1C  10/07/2023   Eye exam for diabetics  03/17/2024   Medicare Annual Wellness Visit  05/25/2024   DTaP/Tdap/Td vaccine (2 - Td or Tdap) 08/24/2027   Pneumonia Vaccine  Completed   Flu Shot  Completed   HPV Vaccine  Aged Out   Colon Cancer Screening  Discontinued   Hepatitis C Screening  Discontinued    Advanced directives: (ACP Link)Information on Advanced Care Planning can be found at [facility] Advance Health Care Directives Advance Health Care Directives. [email or web address]   Next Medicare Annual Wellness Visit scheduled for next year: Yes

## 2023-05-26 NOTE — Progress Notes (Signed)
 Because this visit was a virtual/telehealth visit,  certain criteria was not obtained, such a blood pressure, CBG if applicable, and timed get up and go. Any medications not marked as "taking" were not mentioned during the medication reconciliation part of the visit. Any vitals not documented were not able to be obtained due to this being a telehealth visit or patient was unable to self-report a recent blood pressure [name] due to a lack of equipment at home via telehealth. Vitals that have been documented are verbally provided by the patient.   Subjective:   Scott [name] is a 83 y.o. who presents for a Medicare Wellness preventive visit.  Visit Complete: Virtual I connected with  Scott [name] on 05/26/23 by a audio enabled telemedicine application and verified that I am speaking with the correct person using two identifiers.  Patient Location: Home  Provider Location: Home Office  I discussed the limitations of evaluation and management by telemedicine. The patient expressed understanding and agreed to proceed.  Vital Signs: Because this visit was a virtual/telehealth visit, some criteria may be missing or patient reported. Any vitals not documented were not able to be obtained and vitals that have been documented are patient reported.  VideoDeclined- This patient declined Librarian, academic. Therefore the visit was completed with audio only.  Persons Participating in Visit: Patient.  AWV Questionnaire: No: Patient Medicare AWV questionnaire was not completed prior to this visit.  Cardiac Risk Factors include: advanced age (>78men, >40 women);male gender;diabetes mellitus;hypertension;dyslipidemia     Objective:    Today's Vitals   05/26/23 1134 05/26/23 1135  BP: 139/72   Weight: 144 lb (65.3 kg)   Height: 5\' 11"  (1.803 m)   PainSc:  3    Body mass index is 20.08 kg/m.     05/26/2023   11:46 AM 05/21/2023   10:36 AM 11/21/2022    9:52 AM  07/13/2022   11:20 AM 05/22/2022   11:08 AM 05/21/2022   10:18 AM 11/19/2021   10:38 AM  Advanced Directives  Does Patient Have a Medical Advance Directive? Yes Yes Yes No Yes Yes Yes  Type of Estate agent of [place];Living will Healthcare Power of [place];Living will Healthcare Power of [place];Living will  Healthcare Power of [place];Living will    Does patient want to make changes to medical advance directive? No - Patient declined    No - Patient declined No - Patient declined   Copy of Healthcare Power of Attorney in Chart? Yes - validated most recent copy scanned in chart (See row information) Yes - validated most recent copy scanned in chart (See row information) Yes - validated most recent copy scanned in chart (See row information)  Yes - validated most recent copy scanned in chart (See row information)    Would patient like information on creating a medical advance directive?    No - Patient declined       Current Medications (verified) Outpatient Encounter Medications as of 05/26/2023  Medication Sig   acetaminophen (TYLENOL) 650 MG CR tablet Take 1,300 mg by mouth 2 (two) times daily.   amLODipine (NORVASC) 10 MG tablet Take 1 tablet (10 mg total) by mouth daily.   atorvastatin (LIPITOR) 80 MG tablet TAKE 1 TABLET BY MOUTH ON MONDAY, WEDNESDAY AND FRIDAY   carvedilol (COREG) 6.25 MG tablet Take 1 tablet (6.25 mg total) by mouth 2 (two) times daily with a meal.   Continuous Glucose Sensor (FREESTYLE LIBRE 2 SENSOR) MISC 1  each by Does not apply route every 14 (fourteen) days.   ezetimibe (ZETIA) 10 MG tablet Take 1 tablet (10 mg total) by mouth daily.   gabapentin (NEURONTIN) 100 MG capsule Take 1 capsule (100 mg total) by mouth at bedtime.   Insulin Pen Needle 32G X 4 MM MISC 1 Device by Does not apply route in the morning, at noon, in the evening, and at bedtime.   LANTUS SOLOSTAR 100 UNIT/ML Solostar Pen INJECT 12 UNITS SUBCUTANEOUSLY ONCE DAILY    levothyroxine (SYNTHROID) 88 MCG tablet Take 1 tablet (88 mcg total) by mouth daily.   lipase/protease/amylase (CREON) 36000 UNITS CPEP capsule Take 2 capsules with the first bite of each meal and 1 capsule with the first bite of each snack   meclizine (ANTIVERT) 12.5 MG tablet TAKE 1 TABLET BY MOUTH TWICE DAILY AS NEEDED FOR  DIZZINESS  (SEDATION  PRECAUTIONS)   nitroGLYCERIN (NITROSTAT) 0.4 MG SL tablet Place 1 tablet (0.4 mg total) under the tongue every 5 (five) minutes as needed for chest pain ((MAX of 3 doses)).   NOVOLOG FLEXPEN 100 UNIT/ML FlexPen MAX DAILY 20 UNITS PER SCALE   ondansetron (ZOFRAN-ODT) 4 MG disintegrating tablet Take 1 tablet (4 mg total) by mouth every 8 (eight) hours as needed for nausea or vomiting.   oxymetazoline (AFRIN) 0.05 % nasal spray Place 1 spray into left nostril daily as needed (nose bleeds).   traMADol (ULTRAM) 50 MG tablet TAKE 1 TABLET BY MOUTH EVERY 8 HOURS AS NEEDED   traZODone (DESYREL) 100 MG tablet Take 1 tablet (100 mg total) by mouth at bedtime as needed. for sleep   triamcinolone ointment (KENALOG) 0.5 % Apply 1 application topically 2 (two) times daily.   Facility-Administered Encounter Medications as of 05/26/2023  Medication   sodium chloride flush (NS) 0.9 % injection 10 mL    Allergies (verified) Lisinopril and Prednisone   History: Past Medical History:  Diagnosis Date   Acquired hand deformity 1962   hand saw accident at work   Anemia 10/11/2011   Arthritis    Bradycardia 10/10/2011   CAD (coronary artery disease) 10/10/2011   MI s/p PTCA (Dx-OM2 proximal concentric stenosis)   Carotid artery disease (HCC)    Cellulitis of buttock, left 11/17/2018   Chronic edema    CKD (chronic kidney disease) stage 3, GFR 30-59 ml/min (HCC)    [name]   CLL (chronic lymphocytic leukemia) (HCC)    Colon polyps    Cough 04/28/2019   Dysphagia    EGD - reflux esophagitis, one area of stomach with focal intestinal metaplasia (06/2019) Dr  Allegra Lai   Generalized headaches    frequent   GI bleed 07/08/2017   Glaucoma    s/p laser surgery   HLD (hyperlipidemia)    Hyperkalemia 02/28/2018   Hypertension    Hypothyroidism 10/10/2011   Iron deficiency anemia due to chronic blood loss    Squamous cell carcinoma of skin 06/15/2019   Right posterior ear. SCCis, hypertrophic, crusted   Squamous cell carcinoma of skin 09/26/2022   Left mid helix, mohs 10/31/22. Moderately differentiated, adenoid variant, crusted, deep margin involved.   Sternal fracture 09/13/2017   Thrombocytopenia (HCC) 10/11/2011   Type 2 diabetes with nephropathy [facility])    DM refresher course [facility] (04/2013)   Past Surgical History:  Procedure Laterality Date   BACK SURGERY     cervical neck   CATARACT EXTRACTION, BILATERAL Bilateral 2017   COLONOSCOPY  11/2008   1 polyp, diverticulosis, rec rpt  5 yrs (Dr. Randa Evens, [place] Sprang)   COLONOSCOPY  06/2014   hyperplastic polyp, rpt 5 yrs Randa Evens)   COLONOSCOPY WITH PROPOFOL N/A 11/17/2017   TA, HP, (Vanga, Loel Dubonnet, MD)   ESOPHAGOGASTRODUODENOSCOPY (EGD) WITH PROPOFOL N/A 11/17/2017   healing erosive gastritis, intestinal metaplasia, neg H pylori (Vanga, Loel Dubonnet, MD)   ESOPHAGOGASTRODUODENOSCOPY (EGD) WITH PROPOFOL N/A 06/28/2019   erosive gastropathy with stigmata of recent bleed, granular tissue in esophagus biopsied- reflux esophagitis, small HH (Vanga, Loel Dubonnet, MD)   EYE SURGERY  2012   laser surgery for glaucoma   MOHS SURGERY Left 10/2022   L ear mid helix squamous cell cancer   PORTACATH PLACEMENT Left 12/09/2018   Procedure: INSERTION PORT-A-CATH;  Surgeon: Henrene Dodge, MD;  Location: [facility] ORS;  Service: General;  Laterality: Left;   PTCA  1994, 1995   US ECHOCARDIOGRAPHY  10/2013   inferior wall hypokinesis, mild LVH, EF 50-55%, mild MR and LA dilation   Family History  Problem Relation Age of Onset   Diabetes Sister    Stomach cancer Sister 31   Pneumonia Father        caused death    Other Brother        no communication with brother so unsure of any health conditions   Coronary artery disease Son 6       5v CABG and stents   Hyperlipidemia Sister    Stroke Neg Hx    Heart attack Neg Hx    Social History   Socioeconomic History   Marital status: Widowed    Spouse name: Not on file   Number of children: Not on file   Years of education: Not on file   Highest education level: Not on file  Occupational History   Not on file  Tobacco Use   Smoking status: Former    Current packs/day: 0.00    Types: Cigarettes    Quit date: 03/04/1978    Years since quitting: 45.2    Passive exposure: Past   Smokeless tobacco: Never  Vaping Use   Vaping status: Never Used  Substance and Sexual Activity   Alcohol use: No   Drug use: No   Sexual activity: Never  Other Topics Concern   Not on file  Social History Narrative   Widower. Wife passed 08/2015 from cancer. 1 dog   Occupation: retired, was route Medical illustrator   Edu: 11th gr   Activity: walks dog (pomeranian) 3-4x/day, yardwork, rides bicycle.   Diet: drinks diet coke, no vegetables   Social Drivers of Corporate investment banker Strain: Low Risk  (05/26/2023)   Overall Financial Resource Strain (CARDIA)    Difficulty of Paying Living Expenses: Not hard at all  Food Insecurity: No Food Insecurity (05/26/2023)   Hunger Vital Sign    Worried About Running Out of Food in the Last Year: Never true    Ran Out of Food in the Last Year: Never true  Transportation Needs: No Transportation Needs (05/26/2023)   PRAPARE - Administrator, Civil Service (Medical): No    Lack of Transportation (Non-Medical): No  Physical Activity: Insufficiently Active (05/26/2023)   Exercise Vital Sign    Days of Exercise per Week: 5 days    Minutes of Exercise per Session: 20 min  Stress: No Stress Concern Present (05/26/2023)   [employer] of Occupational Health - Occupational Stress Questionnaire    Feeling of Stress :  Not at all  Social Connections: Socially Isolated (  05/26/2023)   Social Connection and Isolation Panel [NHANES]    Frequency of Communication with Friends and Family: More than three times a week    Frequency of Social Gatherings with Friends and Family: Once a week    Attends Religious Services: Never    Database administrator or Organizations: No    Attends Banker Meetings: Never    Marital Status: Widowed    Tobacco Counseling Counseling given: Not Answered    Clinical Intake:  Pre-visit preparation completed: Yes  Pain : 0-10 Pain Score: 3  Pain Type: Acute pain Pain Location: Back (back and both knees) Pain Orientation: Lower Pain Descriptors / Indicators: Aching Pain Onset: Yesterday Pain Frequency: Rarely Pain Relieving Factors: he takes pain medications  Pain Relieving Factors: he takes pain medications  BMI - recorded: 20.08 Nutritional Status: BMI of 19-24  Normal Nutritional Risks: None Diabetes: Yes CBG done?: No Did pt. bring in CBG monitor from home?: No  Lab Results  Component Value Date   HGBA1C 8.5 (A) 04/09/2023   HGBA1C 8.2 (A) 12/09/2022   HGBA1C 7.9 (H) 06/25/2022     How often do you need to have someone help you when you read instructions, pamphlets, or other written materials from your doctor or pharmacy?: 2 - Rarely  Interpreter Needed?: No  Information entered by :: Scott [name],CMA   Activities of Daily Living     05/26/2023   11:44 AM  In your present state of health, do you have any difficulty performing the following activities:  Hearing? 1  Vision? 0  Difficulty concentrating or making decisions? 0  Walking or climbing stairs? 0  Dressing or bathing? 0  Doing errands, shopping? 0  Preparing Food and eating ? N  Using the Toilet? N  In the past six months, have you accidently leaked urine? Y  Comment yes sometimes  Do you have problems with loss of bowel control? Y  Managing your Medications? N   Managing your Finances? N  Housekeeping or managing your Housekeeping? N    Patient Care Team: Eustaquio Boyden, MD as PCP - General (Family Medicine) Lars Masson, MD as PCP - Cardiology (Cardiology) Jeralyn Ruths, MD as Consulting Physician (Oncology) Ernesto Rutherford, MD as Consulting Physician (Ophthalmology) Lars Masson, MD as Consulting Physician (Cardiology) Bufford Buttner, MD as Consulting Physician (Nephrology) Kathyrn Sheriff, [facility] (Inactive) as Pharmacist (Pharmacist)  Indicate any recent Medical Services you may have received from other than [facility] providers in the past year (date may be approximate).     Assessment:   This is a routine wellness examination for Scott [name].  Hearing/Vision screen Hearing Screening - Comments:: Hearing aids in both ears  Vision Screening - Comments:: No difficulty seeing with glassees   Goals Addressed             This Visit's Progress    Patient Stated   On track    Continue to be active. Work in yard.       Depression Screen     01/06/2023    2:59 PM 12/19/2022    2:56 PM 09/24/2022   12:30 PM 05/22/2022   11:06 AM 05/09/2021    9:32 AM 04/11/2020   12:00 PM 02/22/2019    2:14 PM  PHQ 2/9 Scores  PHQ - 2 Score 0 0 0 0 0 0 0  PHQ- 9 Score  3 3   0 0    Fall Risk     05/26/2023  11:45 AM 01/06/2023    2:59 PM 12/19/2022    2:56 PM 09/24/2022   12:30 PM 05/22/2022   10:59 AM  Fall Risk   Falls in the past year? 1 1 1  0 0  Number falls in past yr: 1 0   1  Comment     trips in the yard  Injury with Fall? 1 0   0  Risk for fall due to : History of fall(s);Impaired balance/gait    Impaired balance/gait  Follow up Falls evaluation completed    Falls prevention discussed;Falls evaluation completed;Education provided    MEDICARE RISK AT HOME:  Medicare Risk at Home Any stairs in or around the home?: Yes If so, are there any without handrails?: Yes Home free of loose throw rugs in walkways, pet beds,  electrical cords, etc?: Yes Adequate lighting in your home to reduce risk of falls?: Yes Life alert?: Yes Use of a cane, walker or w/c?: Yes (cane or waling stick) Grab bars in the bathroom?: Yes Shower chair or bench in shower?: Yes Elevated toilet seat or a handicapped toilet?: Yes  TIMED UP AND GO:  Was the test performed?    Cognitive Function: 6CIT completed       05/26/2023   11:39 AM 05/22/2022   11:10 AM  6CIT Screen  What Year? 0 points 0 points  What month? 0 points 0 points  What time? 0 points 0 points  Count back from 20 2 points 0 points  Months in reverse 4 points 4 points  Repeat phrase 2 points 2 points  Total Score 8 points 6 points    Immunizations Immunization History  Administered Date(s) Administered   Fluad Quad(high Dose 65+) 11/04/2018, 12/18/2020, 12/10/2021   Fluad Trivalent(High Dose 65+) 12/19/2022   Influenza Split 12/03/2011   Influenza, High Dose Seasonal PF 12/10/2019   Influenza,inj,Quad PF,6+ Mos 11/06/2012, 01/25/2014, 02/08/2015, 01/03/2016, 02/17/2017, 01/22/2018   PFIZER(Purple Top)SARS-COV-2 Vaccination 05/02/2019, 05/23/2019, 12/10/2019   Pfizer Covid-19 Vaccine Bivalent Booster 48yrs & up 12/18/2020   Pneumococcal Conjugate-13 07/21/2013   Pneumococcal Polysaccharide-23 03/11/2011   Tdap 08/23/2017   Zoster, Live 03/17/2013    Screening Tests Health Maintenance  Topic Date Due   Zoster Vaccines- Shingrix (1 of 2) 06/02/1959   COVID-19 Vaccine (5 - 2024-25 season) 11/03/2022   Diabetic kidney evaluation - Urine ACR  06/25/2023   Diabetic kidney evaluation - eGFR measurement  07/13/2023   FOOT EXAM  08/26/2023   HEMOGLOBIN A1C  10/07/2023   OPHTHALMOLOGY EXAM  03/17/2024   Medicare Annual Wellness (AWV)  05/25/2024   DTaP/Tdap/Td (2 - Td or Tdap) 08/24/2027   Pneumonia Vaccine 21+ Years old  Completed   INFLUENZA VACCINE  Completed   HPV VACCINES  Aged Out   Colonoscopy  Discontinued   Hepatitis C Screening   Discontinued    Health Maintenance  Health Maintenance Due  Topic Date Due   Zoster Vaccines- Shingrix (1 of 2) 06[birth year]02/1959   COVID-19 Vaccine (5 - 2024-25 season) 11/03/2022   Diabetic kidney evaluation - Urine ACR  06/25/2023   Health Maintenance Items Addressed:patient was advised to get shingles and covid vaccine  Additional Screening:  Vision Screening: Recommended annual ophthalmology exams for early detection of glaucoma and other disorders of the eye.  Dental Screening: Recommended annual dental exams for proper oral hygiene  Community Resource Referral / Chronic Care Management: CRR required this visit?  No   CCM required this visit?  No     Plan:  I have personally reviewed and noted the following in the patient's chart:   Medical and social history Use of alcohol, tobacco or illicit drugs  Current medications and supplements including opioid prescriptions. Patient is not currently taking opioid prescriptions. Functional ability and status Nutritional status Physical activity Advanced directives List of other physicians Hospitalizations, surgeries, and ER visits in previous 12 months Vitals Screenings to include cognitive, depression, and falls Referrals and appointments  In addition, I have reviewed and discussed with patient certain preventive protocols, quality metrics, and best practice recommendations. A written personalized care plan for preventive services as well as general preventive health recommendations were provided to patient.     Rudi Heap, [place]   05/26/2023   After Visit Summary: (MyChart) Due to this being a telephonic visit, the after visit summary with patients personalized plan was offered to patient via MyChart   Notes: Nothing significant to report at this time.

## 2023-05-28 ENCOUNTER — Encounter

## 2023-07-14 ENCOUNTER — Other Ambulatory Visit: Payer: Self-pay

## 2023-07-14 MED ORDER — NOVOLOG FLEXPEN 100 UNIT/ML ~~LOC~~ SOPN: PEN_INJECTOR | SUBCUTANEOUS | 2 refills | Status: DC

## 2023-07-14 MED ORDER — LANTUS SOLOSTAR 100 UNIT/ML ~~LOC~~ SOPN: PEN_INJECTOR | SUBCUTANEOUS | 2 refills | Status: DC

## 2023-07-15 ENCOUNTER — Other Ambulatory Visit: Payer: Self-pay | Admitting: Family Medicine

## 2023-07-15 DIAGNOSIS — I10 Essential (primary) hypertension: Secondary | ICD-10-CM

## 2023-07-28 ENCOUNTER — Other Ambulatory Visit: Payer: Self-pay | Admitting: Internal Medicine

## 2023-07-31 ENCOUNTER — Encounter: Payer: Self-pay | Admitting: Oncology

## 2023-07-31 ENCOUNTER — Other Ambulatory Visit (HOSPITAL_COMMUNITY): Payer: Self-pay

## 2023-07-31 ENCOUNTER — Telehealth: Payer: Self-pay

## 2023-07-31 MED ORDER — BASAGLAR KWIKPEN 100 UNIT/ML ~~LOC~~ SOPN: 10.0000 [IU] | PEN_INJECTOR | Freq: Every day | SUBCUTANEOUS | 3 refills | Status: DC

## 2023-07-31 MED ORDER — LANTUS SOLOSTAR 100 UNIT/ML ~~LOC~~ SOPN: 10.0000 [IU] | PEN_INJECTOR | Freq: Every day | SUBCUTANEOUS | Status: DC

## 2023-07-31 NOTE — Telephone Encounter (Signed)
 Copied from CRM [id number]. Topic: Clinical - Prescription Issue >> Jul 31, 2023 11:02 AM Lajean Pike wrote: Reason for CRM: Patient called in with questions in regards to not receiving his Lantus  yet. He said he's been in contact with his insurance company and [address]. An encounter was created minutes ago showing that a PA is needed for the Lantus . I made the patient aware that a prior auth was needed for his Lantus  and that it looks to be in route to the team to take care of it. Patient may be contacted ar [phone] if needed.

## 2023-07-31 NOTE — Addendum Note (Signed)
 Addended by: Valentina Gasman on: 07/31/2023 01:06 PM   Modules accepted: Orders

## 2023-07-31 NOTE — Telephone Encounter (Signed)
 Patient will pick up a sample pen of Lantus 

## 2023-07-31 NOTE — Telephone Encounter (Signed)
 Noted. (See other 2/29/25 phn note for PA.)

## 2023-07-31 NOTE — Telephone Encounter (Signed)
 Pharmacy Patient Advocate Encounter   Received notification from Pt Calls Messages that prior authorization for Lantus  is required/requested.   Insurance verification completed.   The patient is insured through [employer] .   Per test claim:  Basaglar  is preferred by the insurance.  If suggested medication is appropriate, Please send in a new RX and discontinue this one. If not, please advise as to why it's not appropriate so that we may request a Prior Authorization. Please note, some preferred medications may still require a PA.  If the suggested medications have not been trialed and there are no contraindications to their use, the PA will not be submitted, as it will not be approved.

## 2023-07-31 NOTE — Telephone Encounter (Signed)
 Patient is aware and will use the sample of Lantus  he picked up then get the Basaglar 

## 2023-07-31 NOTE — Telephone Encounter (Signed)
 Per [facility] pharmacy Lantus  needs PA

## 2023-08-07 ENCOUNTER — Other Ambulatory Visit: Payer: Self-pay | Admitting: Family Medicine

## 2023-08-07 DIAGNOSIS — E02 Subclinical iodine-deficiency hypothyroidism: Secondary | ICD-10-CM

## 2023-08-07 NOTE — Telephone Encounter (Signed)
 LAST APPOINTMENT DATE: 01/06/23   NEXT APPOINTMENT DATE: 10/07/2023    LAST REFILL: 06/03/22  QTY: #90 3 rf  Last tsh 06/25/22  Lab Results  Component Value Date   TSH 2.03 06/25/2022

## 2023-08-12 ENCOUNTER — Encounter: Payer: Self-pay | Admitting: Internal Medicine

## 2023-08-12 ENCOUNTER — Ambulatory Visit: Payer: Medicare Other | Admitting: Internal Medicine

## 2023-08-12 VITALS — BP 122/70 | HR 56 | Ht 71.0 in | Wt 146.0 lb

## 2023-08-12 DIAGNOSIS — Z794 Long term (current) use of insulin: Secondary | ICD-10-CM | POA: Diagnosis not present

## 2023-08-12 DIAGNOSIS — E1122 Type 2 diabetes mellitus with diabetic chronic kidney disease: Secondary | ICD-10-CM | POA: Diagnosis not present

## 2023-08-12 DIAGNOSIS — E1142 Type 2 diabetes mellitus with diabetic polyneuropathy: Secondary | ICD-10-CM

## 2023-08-12 DIAGNOSIS — N1832 Chronic kidney disease, stage 3b: Secondary | ICD-10-CM

## 2023-08-12 DIAGNOSIS — E1165 Type 2 diabetes mellitus with hyperglycemia: Secondary | ICD-10-CM | POA: Diagnosis not present

## 2023-08-12 DIAGNOSIS — G63 Polyneuropathy in diseases classified elsewhere: Secondary | ICD-10-CM

## 2023-08-12 LAB — POCT GLYCOSYLATED HEMOGLOBIN (HGB A1C): Hemoglobin A1C: 7.8 % — AB (ref 4.0–5.6)

## 2023-08-12 NOTE — Patient Instructions (Addendum)
 Continue Lantus  10 units daily  Novolog  5 units with Breakfast and 6 units with Supper  Novolog  correctional insulin : ADD extra units on insulin  to your meal-time Novolog  dose if your blood sugars are higher than 190. Use the scale below to help guide you:   Blood sugar before meal Number of units to inject  Less than 190 0 unit  191 - 250 1 units  251 - 310 2 units  311 - 370 3 units  371 - 430 4 units  431 - 490 5 units       HOW TO TREAT LOW BLOOD SUGARS (Blood sugar LESS THAN 70 MG/DL) Please follow the RULE OF 15 for the treatment of hypoglycemia treatment (when your (blood sugars are less than 70 mg/dL)   STEP 1: Take 15 grams of carbohydrates when your blood sugar is low, which includes:  3-4 GLUCOSE TABS  OR 3-4 OZ OF JUICE OR REGULAR SODA OR ONE TUBE OF GLUCOSE GEL    STEP 2: RECHECK blood sugar in 15 MINUTES STEP 3: If your blood sugar is still low at the 15 minute recheck --> then, go back to STEP 1 and treat AGAIN with another 15 grams of carbohydrates.

## 2023-08-12 NOTE — Progress Notes (Unsigned)
 Name: Scott [name]  MRN/ DOB: [id number], 04/30/1940   Age/ Sex: 83 y.o., male    PCP: Claire Crick, MD   Reason for Endocrinology Evaluation: Type 2 Diabetes Mellitus     Date of Initial Endocrinology Visit: 09/27/2021    PATIENT IDENTIFIER: Scott [name] is a 83 y.o. male with a past medical history of T2DM, CLL and hypothyroidism. The patient presented for initial endocrinology clinic visit on 09/27/2021 for consultative assistance with his diabetes management.    HPI: Scott [name] was    Diagnosed with DM 1990 Prior Medications tried/Intolerance: metformin , glipizide  Hemoglobin A1c has ranged from 6.9% in 2019, peaking at 8.6% in 2023.    Patient with pancreatic insufficiency and is on Creon  Patient follows with cardiology for CAD He also follows with oncology for leukemia  On his initial visit to our clinic he had an A1c of 7.8%, he was already on MDI regimen which we adjusted, he was provided with a correction scale   He was started on Jardiance  through PCPs office 06/2022  SUBJECTIVE:   During the last visit (04/09/2023): A1c 8.5 %     Today (08/12/23): Scott [name] is here for a follow up on diabetes management. He checks his  blood sugars multiple times daily. The patient has not had hypoglycemic episodes since the last clinic visit.   Continues to follow up with GI  for exocrine pancreatic insufficiency He continues to follow-up with oncology for CLL   Denies nausea or vomiting  Denies constipation or diarrhea  He continues with toe discomfort at night   HOME DIABETES REGIMEN: Basaglar  10 units daily  NovoLog  5 units BID Correction factor: NovoLog  (BG -140/55) Gabapentin  100 mg at bedtime   CONTINUOUS GLUCOSE MONITORING RECORD INTERPRETATION    Dates of Recording: 5/28-6/12/2023  Sensor description:freestyle libre  Results statistics:   CGM use % of time 96  Average and SD 196/30  Time in range 48%  % Time Above 180 35  % Time above  250 17  % Time Below target 0    Glycemic patterns summary: BGs are mostly optimal overnight and fluctuate during the day  Hyperglycemic episodes  post-prandial  Hypoglycemic episodes occurred during the day  Overnight : Mostly optimal   DIABETIC COMPLICATIONS: Microvascular complications:  CKD III, neuropathy Denies: retinopathy Last eye exam: Completed 01/23/2023  Macrovascular complications:  CAD Denies: PVD, CVA   PAST HISTORY: Past Medical History:  Past Medical History:  Diagnosis Date   Acquired hand deformity 1962   hand saw accident at work   Anemia 10/11/2011   Arthritis    Bradycardia 10/10/2011   CAD (coronary artery disease) 10/10/2011   MI s/p PTCA (Dx-OM2 proximal concentric stenosis)   Carotid artery disease (HCC)    Cellulitis of buttock, left 11/17/2018   Chronic edema    CKD (chronic kidney disease) stage 3, GFR 30-59 ml/min (HCC)    [name]   CLL (chronic lymphocytic leukemia) (HCC)    Colon polyps    Cough 04/28/2019   Dysphagia    EGD - reflux esophagitis, one area of stomach with focal intestinal metaplasia (06/2019) Dr Baldomero Bone   Generalized headaches    frequent   GI bleed 07/08/2017   Glaucoma    s/p laser surgery   HLD (hyperlipidemia)    Hyperkalemia 02/28/2018   Hypertension    Hypothyroidism 10/10/2011   Iron  deficiency anemia due to chronic blood loss    Squamous cell carcinoma of skin 06/15/2019  Right posterior ear. SCCis, hypertrophic, crusted   Squamous cell carcinoma of skin 09/26/2022   Left mid helix, mohs 10/31/22. Moderately differentiated, adenoid variant, crusted, deep margin involved.   Sternal fracture 09/13/2017   Thrombocytopenia (HCC) 10/11/2011   Type 2 diabetes with nephropathy [facility])    DM refresher course [facility] (04/2013)   Past Surgical History:  Past Surgical History:  Procedure Laterality Date   BACK SURGERY     cervical neck   CATARACT EXTRACTION, BILATERAL Bilateral 2017   COLONOSCOPY  11/2008    1 polyp, diverticulosis, rec rpt 5 yrs (Dr. Denece Finger, [facility] Core)   COLONOSCOPY  06/2014   hyperplastic polyp, rpt 5 yrs Denece Finger)   COLONOSCOPY WITH PROPOFOL  N/A 11/17/2017   TA, HP, (Vanga, Elson Halon, MD)   ESOPHAGOGASTRODUODENOSCOPY (EGD) WITH PROPOFOL  N/A 11/17/2017   healing erosive gastritis, intestinal metaplasia, neg H pylori (Vanga, Elson Halon, MD)   ESOPHAGOGASTRODUODENOSCOPY (EGD) WITH PROPOFOL  N/A 06/28/2019   erosive gastropathy with stigmata of recent bleed, granular tissue in esophagus biopsied- reflux esophagitis, small HH (Vanga, Elson Halon, MD)   EYE SURGERY  2012   laser surgery for glaucoma   MOHS SURGERY Left 10/2022   L ear mid helix squamous cell cancer   PORTACATH PLACEMENT Left 12/09/2018   Procedure: INSERTION PORT-A-CATH;  Surgeon: Emmalene Hare, MD;  Location: [facility] ORS;  Service: General;  Laterality: Left;   PTCA  1994, 1995   US  ECHOCARDIOGRAPHY  10/2013   inferior wall hypokinesis, mild LVH, EF 50-55%, mild MR and LA dilation    Social History:  reports that he quit smoking about 45 years ago. His smoking use included cigarettes. He has been exposed to tobacco smoke. He has never used smokeless tobacco. He reports that he does not drink alcohol and does not use drugs. Family History:  Family History  Problem Relation Age of Onset   Diabetes Sister    Stomach cancer Sister 92   Pneumonia Father        caused death   Other Brother        no communication with brother so unsure of any health conditions   Coronary artery disease Son 69       5v CABG and stents   Hyperlipidemia Sister    Stroke Neg Hx    Heart attack Neg Hx      HOME MEDICATIONS: Allergies as of 08/12/2023       Reactions   Lisinopril  Other (See Comments)   Hyperkalemia even at 5mg  dose   Prednisone  Other (See Comments)   Marked symptomatic hyperglycemia        Medication List        Accurate as of August 12, 2023 10:44 AM. If you have any questions, ask your nurse or  doctor.          acetaminophen  650 MG CR tablet Commonly known as: TYLENOL  Take 1,300 mg by mouth 2 (two) times daily.   amLODipine  10 MG tablet Commonly known as: NORVASC  Take 1 tablet (10 mg total) by mouth daily.   atorvastatin  80 MG tablet Commonly known as: LIPITOR  TAKE 1 TABLET BY MOUTH ON MONDAY, WEDNESDAY AND FRIDAY   Basaglar  KwikPen 100 UNIT/ML Inject 10 Units into the skin daily.   carvedilol  6.25 MG tablet Commonly known as: COREG  TAKE 1 TABLET BY MOUTH TWICE DAILY WITH A MEAL   ezetimibe  10 MG tablet Commonly known as: ZETIA  Take 1 tablet (10 mg total) by mouth daily.   FreeStyle [employer] 2 Sensor Misc  1 each by Does not apply route every 14 (fourteen) days.   gabapentin  100 MG capsule Commonly known as: Neurontin  Take 1 capsule (100 mg total) by mouth at bedtime.   Insulin  Pen Needle 32G X 4 MM Misc 1 Device by Does not apply route in the morning, at noon, in the evening, and at bedtime.   levothyroxine  88 MCG tablet Commonly known as: SYNTHROID  Take 1 tablet by mouth once daily   lipase/protease/amylase 16109 UNITS Cpep capsule Commonly known as: Creon  Take 2 capsules with the first bite of each meal and 1 capsule with the first bite of each snack   meclizine  12.5 MG tablet Commonly known as: ANTIVERT  TAKE 1 TABLET BY MOUTH TWICE DAILY AS NEEDED FOR  DIZZINESS  (SEDATION  PRECAUTIONS)   nitroGLYCERIN  0.4 MG SL tablet Commonly known as: NITROSTAT  Place 1 tablet (0.4 mg total) under the tongue every 5 (five) minutes as needed for chest pain ((MAX of 3 doses)).   NovoLOG  FlexPen 100 UNIT/ML FlexPen Generic drug: insulin  aspart Max daily 20 units per scale   ondansetron  4 MG disintegrating tablet Commonly known as: ZOFRAN -ODT Take 1 tablet (4 mg total) by mouth every 8 (eight) hours as needed for nausea or vomiting.   oxymetazoline  0.05 % nasal spray Commonly known as: AFRIN Place 1 spray into left nostril daily as needed (nose bleeds).    traMADol  50 MG tablet Commonly known as: ULTRAM  TAKE 1 TABLET BY MOUTH EVERY 8 HOURS AS NEEDED   traZODone  100 MG tablet Commonly known as: DESYREL  Take 1 tablet (100 mg total) by mouth at bedtime as needed. for sleep   triamcinolone  ointment 0.5 % Commonly known as: KENALOG  Apply 1 application topically 2 (two) times daily.   zolpidem  10 MG tablet Commonly known as: AMBIEN  TAKE ONE TABLET BY MOUTH AT BEDTIME AS NEEDED FOR SLEEP for 30         ALLERGIES: Allergies  Allergen Reactions   Lisinopril  Other (See Comments)    Hyperkalemia even at 5mg  dose   Prednisone  Other (See Comments)    Marked symptomatic hyperglycemia     REVIEW OF SYSTEMS: A comprehensive ROS was conducted with the patient and is negative except as per HPI     OBJECTIVE:   VITAL SIGNS: BP 122/70 (BP Location: Left Arm, Patient Position: Sitting, Cuff Size: Normal)   Pulse (!) 56   Ht 5\' 11"  (1.803 m)   Wt 146 lb (66.2 kg)   SpO2 97%   BMI 20.36 kg/m    PHYSICAL EXAM:  General: Pt appears well and is in NAD  Lungs: Clear with good BS bilat   Heart: RRR  LE:  Trace edema B/L  Neuro: MS is good with appropriate affect, pt is alert and Ox3    DM foot exam: 08/12/2023  The skin of the feet is without sores or ulcerations, distorted and discolored toenails The pedal pulses are 1+ on right and 1+ on left. The sensation is decreased to a screening 5.07, 10 gram monofilament bilaterally at the left great toe    DATA REVIEWED:  Lab Results  Component Value Date   HGBA1C 7.8 (A) 08/12/2023   HGBA1C 8.5 (A) 04/09/2023   HGBA1C 8.2 (A) 12/09/2022    Latest Reference Range & Units 07/13/22 11:22  Sodium 135 - 145 mmol/L 137  Potassium 3.5 - 5.1 mmol/L 3.8  Chloride 98 - 111 mmol/L 105  CO2 22 - 32 mmol/L 23  Glucose 70 - 99 mg/dL 604 (H)  BUN 8 - 23 mg/dL 55 (H)  Creatinine 8.41 - 1.24 mg/dL 3.24 (H)  Calcium  8.9 - 10.3 mg/dL 9.1  Anion gap 5 - 15  9  Alkaline Phosphatase 38 - 126 U/L  84  Albumin 3.5 - 5.0 g/dL 3.9  Lipase 11 - 51 U/L 25  AST 15 - 41 U/L 25  ALT 0 - 44 U/L 19  Total Protein 6.5 - 8.1 g/dL 7.0  Total Bilirubin 0.3 - 1.2 mg/dL 0.8  GFR, Estimated >40 mL/min 38 (L)    ASSESSMENT / PLAN / RECOMMENDATIONS:   1) Type 2 Diabetes Mellitus,Sub- optimally controlled, With CKD III , neuropathic and macrovascular complications - Most recent A1c of 7.8 %. Goal A1c < 7.5 %.    -A1c has trended down from 8.5% to 7.8% -Diabetes  Mellitus  is felt to be pancreatic in origin -SGLT2 inhibitors have been cost prohibitive -With a low BMI of 20, I would avoid any medications that will facilitate further weight loss -Patient has severe fear of hypoglycemia, which makes it difficult to optimize his glucose control - I will increase his prandial insulin  with supper - I will also adjust his sensitivity factor from 55 to 60    MEDICATIONS: Continue Lantus  10 units daily Change  NovoLog  5 units with breakfast and 6 units with supper Change correction factor: NovoLog  (BG -130/60) TIDQAC    EDUCATION / INSTRUCTIONS: BG monitoring instructions: Patient is instructed to check his blood sugars 3 times a day, before meals. Call [facility] if: BG persistently < 70  I reviewed the Rule of 15 for the treatment of hypoglycemia in detail with the patient. Literature supplied.   2) Diabetic complications:  Eye: Does not have known diabetic retinopathy.  Neuro/ Feet: Does  have known diabetic peripheral neuropathy. Renal: Patient does  have known baseline CKD. He is not on an ACEI/ARB at present.patient is intolerant to lisinopril     3) Peripheral neuropathy:  -Neuropathic symptoms have improved with gabapentin  -Will continue  Medication Continue Gabapentin  100 mg at bedtime    Follow-up in 6 months  I spent 30 minutes preparing to see the patient by review of recent labs, imaging and procedures, obtaining and reviewing separately obtained  history, communicating with the patient, ordering medications, tests or procedures, and documenting clinical information in the EHR including the differential Dx, treatment, and any further evaluation and other management     Signed electronically by: Natale Bail, MD  [facility]  [facility] [address] [place], [place] [address] Phone: [phone] FAX: [phone]   CC: Claire Crick, MD [address] [place] [address] Phone: [phone]  Fax: [phone]    Return to Endocrinology clinic as below: Future Appointments  Date Time Provider Department Center  08/12/2023 10:50 AM Avarey Yaeger, Julian Obey, MD [facility] None  10/07/2023 10:00 AM [facility]  10/14/2023 10:00 AM Claire Crick, MD [facility]  11/21/2023 11:30 AM [facility] None  04/20/2024 11:00 AM Harris Liming, MD [facility] None  05/20/2024  1:30 PM [facility] None  05/20/2024  2:00 PM Shellie Dials, MD [facility] None

## 2023-08-13 LAB — BASIC METABOLIC PANEL WITH GFR
BUN/Creatinine Ratio: 27 (calc) — ABNORMAL HIGH (ref 6–22)
BUN: 39 mg/dL — ABNORMAL HIGH (ref 7–25)
CO2: 29 mmol/L (ref 20–32)
Calcium: 9.6 mg/dL (ref 8.6–10.3)
Chloride: 108 mmol/L (ref 98–110)
Creat: 1.47 mg/dL — ABNORMAL HIGH (ref 0.70–1.22)
Glucose, Bld: 171 mg/dL — ABNORMAL HIGH (ref 65–99)
Potassium: 5 mmol/L (ref 3.5–5.3)
Sodium: 144 mmol/L (ref 135–146)
eGFR: 47 mL/min/{1.73_m2} — ABNORMAL LOW (ref >=60)

## 2023-08-13 LAB — LIPID PANEL
Cholesterol: 117 mg/dL (ref <200)
HDL: 39 mg/dL — ABNORMAL LOW (ref >=40)
LDL Cholesterol (Calc): 57 mg/dL
Non-HDL Cholesterol (Calc): 78 mg/dL (ref <130)
Total CHOL/HDL Ratio: 3 (calc) (ref <5.0)
Triglycerides: 120 mg/dL (ref <150)

## 2023-08-13 LAB — MICROALBUMIN / CREATININE URINE RATIO
Creatinine, Urine: 103 mg/dL (ref 20–320)
Microalb Creat Ratio: 2108 mg/g{creat} — ABNORMAL HIGH (ref <30)
Microalb, Ur: 217.1 mg/dL

## 2023-08-14 ENCOUNTER — Ambulatory Visit: Payer: Self-pay | Admitting: Internal Medicine

## 2023-08-14 DIAGNOSIS — E1122 Type 2 diabetes mellitus with diabetic chronic kidney disease: Secondary | ICD-10-CM

## 2023-08-14 NOTE — Telephone Encounter (Signed)
 Please let the patient know that he is leaking too much protein in the urine, in the past he was on lisinopril  which typically helps the kidneys but that cause high potassium.   I would suggest that he follows up with nephrology.  Please check if the patient sees a kidney specialist if not, let me know and I will place a referral   Thanks

## 2023-08-17 ENCOUNTER — Other Ambulatory Visit: Payer: Self-pay | Admitting: Family Medicine

## 2023-08-17 DIAGNOSIS — I10 Essential (primary) hypertension: Secondary | ICD-10-CM

## 2023-09-08 ENCOUNTER — Telehealth: Payer: Self-pay

## 2023-09-08 DIAGNOSIS — N189 Chronic kidney disease, unspecified: Secondary | ICD-10-CM | POA: Diagnosis not present

## 2023-09-08 DIAGNOSIS — I10 Essential (primary) hypertension: Secondary | ICD-10-CM | POA: Diagnosis not present

## 2023-09-08 DIAGNOSIS — I2510 Atherosclerotic heart disease of native coronary artery without angina pectoris: Secondary | ICD-10-CM | POA: Diagnosis not present

## 2023-09-08 DIAGNOSIS — C9190 Lymphoid leukemia, unspecified not having achieved remission: Secondary | ICD-10-CM | POA: Diagnosis not present

## 2023-09-08 LAB — LAB REPORT - SCANNED: EGFR: 45

## 2023-09-08 NOTE — Telephone Encounter (Signed)
 Copied from CRM [id number]. Topic: General - Other >> Sep 08, 2023  2:19 PM Mercedes MATSU wrote: Reason for CRM: [employer] Rep calling in on behalf of patient, stating that she will fax over a form to the office which is a prior auth form for him to recieve back and knee braces.

## 2023-09-08 NOTE — Telephone Encounter (Addendum)
 He will need OV to review this as we have not discussed in the past.

## 2023-09-09 ENCOUNTER — Other Ambulatory Visit: Payer: Self-pay | Admitting: Nephrology

## 2023-09-09 DIAGNOSIS — N189 Chronic kidney disease, unspecified: Secondary | ICD-10-CM

## 2023-09-09 NOTE — Telephone Encounter (Signed)
 Lvm to schedule appt to discuss knee braces.

## 2023-09-10 ENCOUNTER — Inpatient Hospital Stay
Admission: RE | Admit: 2023-09-10 | Discharge: 2023-09-10 | Discharge status: Home / Self Care | Source: Ambulatory Visit | Attending: Nephrology | Admitting: Nephrology

## 2023-09-10 ENCOUNTER — Encounter: Payer: Self-pay | Admitting: Oncology

## 2023-09-10 DIAGNOSIS — N189 Chronic kidney disease, unspecified: Secondary | ICD-10-CM

## 2023-09-10 DIAGNOSIS — N281 Cyst of kidney, acquired: Secondary | ICD-10-CM | POA: Diagnosis not present

## 2023-09-11 ENCOUNTER — Other Ambulatory Visit: Payer: Self-pay | Admitting: Family Medicine

## 2023-09-11 DIAGNOSIS — E1169 Type 2 diabetes mellitus with other specified complication: Secondary | ICD-10-CM

## 2023-09-11 DIAGNOSIS — G8929 Other chronic pain: Secondary | ICD-10-CM

## 2023-09-11 DIAGNOSIS — R42 Dizziness and giddiness: Secondary | ICD-10-CM

## 2023-09-11 NOTE — Telephone Encounter (Signed)
 Printed copy of from placed in Lisa's basket. Please call patient for office visit.

## 2023-09-11 NOTE — Telephone Encounter (Signed)
 Name of Medication:  Tramadol  Name of Pharmacy:  [facility] Last Fill or Written Date and Quantity:  05/12/23, #20 Last Office Visit and Type:  01/06/23, 6 mo HTN f/u Next Office Visit and Type:  10/14/23, CPE Last Controlled Substance Agreement Date:  09/28/12 Last UDS:  09/28/12  Meclizine  last filled:  04/08/23, #20

## 2023-09-11 NOTE — Telephone Encounter (Signed)
 Spoke to patient he states that his pain in his knees is not that bad, he thinks the person was trying to sell him something. He does not think he needs the knee braces.  States he will just talk to Dr KANDICE during his physical in August.

## 2023-09-15 NOTE — Telephone Encounter (Signed)
Noted.  Form is in basket on Lisa's desk.  °

## 2023-09-15 NOTE — Telephone Encounter (Signed)
 ERx

## 2023-09-17 ENCOUNTER — Ambulatory Visit (HOSPITAL_COMMUNITY)
Admission: RE | Admit: 2023-09-17 | Discharge: 2023-09-17 | Disposition: A | Discharge status: Home / Self Care | Source: Ambulatory Visit | Attending: Family Medicine | Admitting: Family Medicine

## 2023-09-17 DIAGNOSIS — I779 Disorder of arteries and arterioles, unspecified: Secondary | ICD-10-CM | POA: Diagnosis not present

## 2023-09-17 DIAGNOSIS — I6522 Occlusion and stenosis of left carotid artery: Secondary | ICD-10-CM | POA: Diagnosis not present

## 2023-09-22 ENCOUNTER — Ambulatory Visit: Payer: Self-pay | Admitting: Family Medicine

## 2023-09-22 DIAGNOSIS — I6523 Occlusion and stenosis of bilateral carotid arteries: Secondary | ICD-10-CM

## 2023-09-24 DIAGNOSIS — H02836 Dermatochalasis of left eye, unspecified eyelid: Secondary | ICD-10-CM | POA: Diagnosis not present

## 2023-09-24 DIAGNOSIS — H02833 Dermatochalasis of right eye, unspecified eyelid: Secondary | ICD-10-CM | POA: Diagnosis not present

## 2023-09-24 DIAGNOSIS — E119 Type 2 diabetes mellitus without complications: Secondary | ICD-10-CM | POA: Diagnosis not present

## 2023-09-24 DIAGNOSIS — H33192 Other retinoschisis and retinal cysts, left eye: Secondary | ICD-10-CM | POA: Diagnosis not present

## 2023-09-24 DIAGNOSIS — H35372 Puckering of macula, left eye: Secondary | ICD-10-CM | POA: Diagnosis not present

## 2023-09-24 DIAGNOSIS — H43813 Vitreous degeneration, bilateral: Secondary | ICD-10-CM | POA: Diagnosis not present

## 2023-09-29 NOTE — Telephone Encounter (Signed)
 [employer] calling regarding the form advised per the note that Dr KANDICE will discuss durning CPE in August.

## 2023-10-05 ENCOUNTER — Other Ambulatory Visit: Payer: Self-pay | Admitting: Family Medicine

## 2023-10-05 DIAGNOSIS — C9110 Chronic lymphocytic leukemia of B-cell type not having achieved remission: Secondary | ICD-10-CM

## 2023-10-05 DIAGNOSIS — E039 Hypothyroidism, unspecified: Secondary | ICD-10-CM

## 2023-10-05 DIAGNOSIS — E1165 Type 2 diabetes mellitus with hyperglycemia: Secondary | ICD-10-CM

## 2023-10-05 DIAGNOSIS — D631 Anemia in chronic kidney disease: Secondary | ICD-10-CM

## 2023-10-05 DIAGNOSIS — N1832 Chronic kidney disease, stage 3b: Secondary | ICD-10-CM

## 2023-10-05 DIAGNOSIS — E1169 Type 2 diabetes mellitus with other specified complication: Secondary | ICD-10-CM

## 2023-10-07 ENCOUNTER — Other Ambulatory Visit (INDEPENDENT_AMBULATORY_CARE_PROVIDER_SITE_OTHER)

## 2023-10-07 DIAGNOSIS — E039 Hypothyroidism, unspecified: Secondary | ICD-10-CM

## 2023-10-07 DIAGNOSIS — N1832 Chronic kidney disease, stage 3b: Secondary | ICD-10-CM | POA: Diagnosis not present

## 2023-10-07 DIAGNOSIS — Z794 Long term (current) use of insulin: Secondary | ICD-10-CM | POA: Diagnosis not present

## 2023-10-07 DIAGNOSIS — E1165 Type 2 diabetes mellitus with hyperglycemia: Secondary | ICD-10-CM | POA: Diagnosis not present

## 2023-10-07 LAB — CBC WITH DIFFERENTIAL/PLATELET
Basophils Absolute: 0 K/uL (ref 0.0–0.1)
Basophils Relative: 0.5 % (ref 0.0–3.0)
Eosinophils Absolute: 0.1 K/uL (ref 0.0–0.7)
Eosinophils Relative: 1.7 % (ref 0.0–5.0)
HCT: 36 % — ABNORMAL LOW (ref 39.0–52.0)
Hemoglobin: 11.8 g/dL — ABNORMAL LOW (ref 13.0–17.0)
Lymphocytes Relative: 34.5 % (ref 12.0–46.0)
Lymphs Abs: 1.5 K/uL (ref 0.7–4.0)
MCHC: 32.7 g/dL (ref 30.0–36.0)
MCV: 90.9 fl (ref 78.0–100.0)
Monocytes Absolute: 0.3 K/uL (ref 0.1–1.0)
Monocytes Relative: 7.6 % (ref 3.0–12.0)
Neutro Abs: 2.5 K/uL (ref 1.4–7.7)
Neutrophils Relative %: 55.7 % (ref 43.0–77.0)
Platelets: 158 K/uL (ref 150.0–400.0)
RBC: 3.97 Mil/uL — ABNORMAL LOW (ref 4.22–5.81)
RDW: 14.2 % (ref 11.5–15.5)
WBC: 4.4 K/uL (ref 4.0–10.5)

## 2023-10-07 LAB — COMPREHENSIVE METABOLIC PANEL WITH GFR
ALT: 11 U/L (ref 0–53)
AST: 17 U/L (ref 0–37)
Albumin: 3.9 g/dL (ref 3.5–5.2)
Alkaline Phosphatase: 99 U/L (ref 39–117)
BUN: 38 mg/dL — ABNORMAL HIGH (ref 6–23)
CO2: 30 meq/L (ref 19–32)
Calcium: 9 mg/dL (ref 8.4–10.5)
Chloride: 107 meq/L (ref 96–112)
Creatinine, Ser: 1.59 mg/dL — ABNORMAL HIGH (ref 0.40–1.50)
GFR: 39.94 mL/min — ABNORMAL LOW (ref >60.00)
Glucose, Bld: 157 mg/dL — ABNORMAL HIGH (ref 70–99)
Potassium: 3.8 meq/L (ref 3.5–5.1)
Sodium: 144 meq/L (ref 135–145)
Total Bilirubin: 0.4 mg/dL (ref 0.2–1.2)
Total Protein: 6.1 g/dL (ref 6.0–8.3)

## 2023-10-07 LAB — TSH: TSH: 1.85 u[IU]/mL (ref 0.35–5.50)

## 2023-10-07 LAB — VITAMIN D 25 HYDROXY (VIT D DEFICIENCY, FRACTURES): VITD: 40.99 ng/mL (ref 30.00–100.00)

## 2023-10-07 LAB — PHOSPHORUS: Phosphorus: 3.8 mg/dL (ref 2.3–4.6)

## 2023-10-10 LAB — PARATHYROID HORMONE, INTACT (NO CA): PTH: 30 pg/mL (ref 16–77)

## 2023-10-10 LAB — FRUCTOSAMINE: Fructosamine: 354 umol/L — ABNORMAL HIGH (ref 205–285)

## 2023-10-12 ENCOUNTER — Ambulatory Visit: Payer: Self-pay | Admitting: Family Medicine

## 2023-10-13 NOTE — Telephone Encounter (Signed)
 Reason for CRM: Lilly from [employer] would like to know if the clinic still has the paperwork for a prior authorization for patient to receive Orthopedic braces that was faxed on 09/04/23. Callback #: [id number]. Okay to leave vm

## 2023-10-13 NOTE — Telephone Encounter (Addendum)
 Spoke with Arleene notifying her we still have ppw. She requests if form can be completed and faxed after pt's OV tomorrow.

## 2023-10-14 ENCOUNTER — Encounter: Payer: Self-pay | Admitting: Family Medicine

## 2023-10-14 ENCOUNTER — Telehealth: Payer: Self-pay

## 2023-10-14 ENCOUNTER — Telehealth: Payer: Self-pay | Admitting: Family Medicine

## 2023-10-14 ENCOUNTER — Ambulatory Visit (INDEPENDENT_AMBULATORY_CARE_PROVIDER_SITE_OTHER): Admitting: Family Medicine

## 2023-10-14 VITALS — BP 122/76 | HR 58 | Temp 98.6°F | Ht 70.0 in | Wt 145.0 lb

## 2023-10-14 DIAGNOSIS — H9193 Unspecified hearing loss, bilateral: Secondary | ICD-10-CM

## 2023-10-14 DIAGNOSIS — E1140 Type 2 diabetes mellitus with diabetic neuropathy, unspecified: Secondary | ICD-10-CM | POA: Diagnosis not present

## 2023-10-14 DIAGNOSIS — E785 Hyperlipidemia, unspecified: Secondary | ICD-10-CM | POA: Diagnosis not present

## 2023-10-14 DIAGNOSIS — Z7189 Other specified counseling: Secondary | ICD-10-CM | POA: Diagnosis not present

## 2023-10-14 DIAGNOSIS — K8681 Exocrine pancreatic insufficiency: Secondary | ICD-10-CM

## 2023-10-14 DIAGNOSIS — N1832 Chronic kidney disease, stage 3b: Secondary | ICD-10-CM | POA: Diagnosis not present

## 2023-10-14 DIAGNOSIS — I10 Essential (primary) hypertension: Secondary | ICD-10-CM

## 2023-10-14 DIAGNOSIS — G4700 Insomnia, unspecified: Secondary | ICD-10-CM

## 2023-10-14 DIAGNOSIS — Z794 Long term (current) use of insulin: Secondary | ICD-10-CM

## 2023-10-14 DIAGNOSIS — R42 Dizziness and giddiness: Secondary | ICD-10-CM

## 2023-10-14 DIAGNOSIS — Z23 Encounter for immunization: Secondary | ICD-10-CM

## 2023-10-14 DIAGNOSIS — F5104 Psychophysiologic insomnia: Secondary | ICD-10-CM

## 2023-10-14 DIAGNOSIS — E1142 Type 2 diabetes mellitus with diabetic polyneuropathy: Secondary | ICD-10-CM

## 2023-10-14 DIAGNOSIS — H409 Unspecified glaucoma: Secondary | ICD-10-CM

## 2023-10-14 DIAGNOSIS — E1122 Type 2 diabetes mellitus with diabetic chronic kidney disease: Secondary | ICD-10-CM | POA: Diagnosis not present

## 2023-10-14 DIAGNOSIS — E02 Subclinical iodine-deficiency hypothyroidism: Secondary | ICD-10-CM

## 2023-10-14 DIAGNOSIS — E1165 Type 2 diabetes mellitus with hyperglycemia: Secondary | ICD-10-CM | POA: Diagnosis not present

## 2023-10-14 DIAGNOSIS — I6523 Occlusion and stenosis of bilateral carotid arteries: Secondary | ICD-10-CM

## 2023-10-14 DIAGNOSIS — E1169 Type 2 diabetes mellitus with other specified complication: Secondary | ICD-10-CM

## 2023-10-14 DIAGNOSIS — C9110 Chronic lymphocytic leukemia of B-cell type not having achieved remission: Secondary | ICD-10-CM

## 2023-10-14 DIAGNOSIS — I2510 Atherosclerotic heart disease of native coronary artery without angina pectoris: Secondary | ICD-10-CM

## 2023-10-14 DIAGNOSIS — G8929 Other chronic pain: Secondary | ICD-10-CM

## 2023-10-14 MED ORDER — ATORVASTATIN CALCIUM 80 MG PO TABS: ORAL_TABLET | ORAL | 3 refills | Status: AC

## 2023-10-14 MED ORDER — TRAZODONE HCL 100 MG PO TABS
100.0000 mg | ORAL_TABLET | Freq: Every evening | ORAL | 4 refills | Status: AC | PRN
Start: 2023-10-14 — End: ?

## 2023-10-14 MED ORDER — LEVOTHYROXINE SODIUM 88 MCG PO TABS
88.0000 ug | ORAL_TABLET | Freq: Every day | ORAL | 3 refills | Status: AC
Start: 2023-10-14 — End: ?

## 2023-10-14 MED ORDER — CARVEDILOL 6.25 MG PO TABS: 6.2500 mg | ORAL_TABLET | Freq: Two times a day (BID) | ORAL | 3 refills | Status: AC

## 2023-10-14 NOTE — Telephone Encounter (Addendum)
 Pt had OV today and discussed PA with Dr KANDICE. Per Dr KANDICE, pt decided he does not want braces and asks that [employer] stop contacting him and our office.  This message was also faxed today to [employer] with Dr Talmadge initials.   Lvm for ext [phone] asking them to call back. Plz relay info above.

## 2023-10-14 NOTE — Progress Notes (Signed)
 Ph: [phone] Fax: [phone]   Patient ID: Scott [name], male    DOB: April 23, 1940, 83 y.o.   MRN: [id number]  This visit was conducted in person.  BP 122/76   Pulse (!) 58   Temp 98.6 F (37 C) (Oral)   Ht 5' 10 (1.778 m)   Wt 145 lb (65.8 kg)   SpO2 97%   BMI 20.81 kg/m    CC: AMW f/u visit  Subjective:   HPI: TYCHO CHERAMIE is a 83 y.o. male presenting on 10/14/2023 for No chief complaint on file.   Son Nancyann had heart attack resulting in car accident and death 09-24-2023.  Saw health advisor 05/2023 for medicare wellness visit. Note reviewed.  6CIT score of 8 - failed cognitive assessment.  Pt notes occasional trouble with memory.  Has hearing aides - and these are working well  He states he was contacted by [employer] company from [place] offering back and knee braces. He is not interested in this. We have been contacted multiple times about this as well - message sent back asking to stop contacting patient/us .   No results found.  Flowsheet Row Office Visit from 10/14/2023 in [facility] [place]  PHQ-2 Total Score 0       10/14/2023    9:59 AM 05/26/2023   11:45 AM 01/06/2023    2:59 PM 12/19/2022    2:56 PM 09/24/2022   12:30 PM  Fall Risk   Falls in the past year? 1 1 1 1  0  Number falls in past yr: 1 1 0    Injury with Fall? 0 1 0    Risk for fall due to : History of fall(s) History of fall(s);Impaired balance/gait     Follow up Falls evaluation completed Falls evaluation completed       CLL with 11q ATM gene mutation - followed by onc Garrick). S/p Rituxan  + Treanda  treatment 01/2019, in partial remission. Just had port-a-cath removed.    Known CAD with stable angina - followed by Dr Gollan.    Severe EPI - followed by GI (Vanga) on lifelong Creon . Presented with postprandial abdominal pain associated with weight loss and diffuse pancreatic atrophy on CT 2017/09/23. Fecal elastase levels <50.    Insulin  dependent  T2DM followed by endocrinology Dr Sam. Goal A1c <7.5%. Monitors sugars with CGM. Current regimen includes mealtime novolog  5u breakfast, 6u supper + SSI, lantus  10u QAM. He worries about hypoglycemia if his sugars are <200 at bedtime.   Saw nephrology Dr Melia with [facility] in [place] - referred by endo for proteinuria. Renal US  09/2023 showed bilateral complex renal cysts - rec rpt 6-12 wks pelvic US  to monitor this. Due for f/u. States nephrology stopped amlodipine , started new bp med ?ARB - no records available.   Preventative: COLONOSCOPY WITH PROPOFOL  11/17/2017 - TA, HP (Vanga, Corinn Skiff, MD)  ESOPHAGOGASTRODUODENOSCOPY (EGD) WITH PROPOFOL  11/17/2017 - healing erosive gastritis, intestinal metaplasia, neg H pylori (Vanga, Corinn Skiff, MD)  Prostate screening - aged out. Lung cancer screen - not eligible  Flu shot yearly COVID vaccine Pfizer 04/2019, 05/2019, booster 12/2019, bivalent 12/2020 Pneumovax 2013. Prevnar-13 07/2013, prevnar-20 today Tdap September 23, 2017 zostavax 2015 RSV - discussed, to consider at pharmacy shingrix - discussed, to consider at pharmacy Advanced directives - full code but wouldn't want prolonged life support. Has updated living will with lawyer - will bring us  copy. HCPOA oldest son and granddaughter.  Seat belt use discussed.  Sunscreen use discussed. No changing moles on skin.  Ex smoker Alcohol use - none Dentist - has dentures, new ones  Eye exam yearly  Bowel - no constipation  Bladder - no incontinence   Widower 2017. 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 Interim medical history since our last visit reviewed. Allergies and medications reviewed and updated. Outpatient Medications Prior to Visit  Medication Sig Dispense Refill   acetaminophen  (TYLENOL ) 650 MG CR  tablet Take 1,300 mg by mouth 2 (two) times daily.     Continuous Glucose Sensor (FREESTYLE LIBRE 2 SENSOR) MISC 1 each by Does not apply route every 14 (fourteen) days. 6 each 3   ezetimibe  (ZETIA ) 10 MG tablet Take 1 tablet (10 mg total) by mouth daily. 30 tablet 11   gabapentin  (NEURONTIN ) 100 MG capsule Take 1 capsule (100 mg total) by mouth at bedtime. 90 capsule 3   Insulin  Glargine (BASAGLAR  KWIKPEN) 100 UNIT/ML Inject 10 Units into the skin daily. 15 mL 3   Insulin  Pen Needle 32G X 4 MM MISC 1 Device by Does not apply route in the morning, at noon, in the evening, and at bedtime. 400 each 3   lipase/protease/amylase (CREON ) 36000 UNITS CPEP capsule Take 2 capsules with the first bite of each meal and 1 capsule with the first bite of each snack 240 capsule 11   losartan (COZAAR) 50 MG tablet Take 1 tablet (50 mg total) by mouth daily.     meclizine  (ANTIVERT ) 12.5 MG tablet TAKE 1 TABLET BY MOUTH TWICE DAILY AS NEEDED FOR  DIZZINESS  (SEDATION  PRECAUTIONS) 20 tablet 0   nitroGLYCERIN  (NITROSTAT ) 0.4 MG SL tablet Place 1 tablet (0.4 mg total) under the tongue every 5 (five) minutes as needed for chest pain ((MAX of 3 doses)). 25 tablet 0   NOVOLOG  FLEXPEN 100 UNIT/ML FlexPen Max daily 20 units per scale 15 mL 2   ondansetron  (ZOFRAN -ODT) 4 MG disintegrating tablet Take 1 tablet (4 mg total) by mouth every 8 (eight) hours as needed for nausea or vomiting. 12 tablet 0   oxymetazoline  (AFRIN) 0.05 % nasal spray Place 1 spray into left nostril daily as needed (nose bleeds).     traMADol  (ULTRAM ) 50 MG tablet TAKE 1 TABLET BY MOUTH EVERY 8 HOURS AS NEEDED 20 tablet 0   triamcinolone  ointment (KENALOG ) 0.5 % Apply 1 application topically 2 (two) times daily. 30 g 1   zolpidem  (AMBIEN ) 10 MG tablet TAKE ONE TABLET BY MOUTH AT BEDTIME AS NEEDED FOR SLEEP for 30     atorvastatin  (LIPITOR ) 80 MG tablet TAKE ONE TABLET BY MOUTH ON MONDAY, WEDNESDAY, AND FRIDAY 39 tablet 0   carvedilol  (COREG ) 6.25 MG  tablet TAKE 1 TABLET BY MOUTH TWICE DAILY WITH A MEAL 180 tablet 0   levothyroxine  (SYNTHROID ) 88 MCG tablet Take 1 tablet by mouth once daily 90 tablet 0   traZODone  (DESYREL ) 100 MG tablet Take 1 tablet (100 mg total) by mouth at bedtime as needed. for sleep 90 tablet 4   amLODipine  (NORVASC ) 10 MG tablet Take 1 tablet by mouth once daily (Patient not taking: Reported on 10/14/2023) 90 tablet 0   sodium chloride  flush (NS) 0.9 % injection 10 mL      No facility-administered medications prior to visit.     Per  HPI unless specifically indicated in ROS section below Review of Systems  Objective:  BP 122/76   Pulse (!) 58   Temp 98.6 F (37 C) (Oral)   Ht 5' 10 (1.778 m)   Wt 145 lb (65.8 kg)   SpO2 97%   BMI 20.81 kg/m   Wt Readings from Last 3 Encounters:  10/14/23 145 lb (65.8 kg)  08/12/23 146 lb (66.2 kg)  05/26/23 144 lb (65.3 kg)      Physical Exam Vitals and nursing note reviewed.  Constitutional:      General: He is not in acute distress.    Appearance: Normal appearance. He is well-developed. He is not ill-appearing.  HENT:     Head: Normocephalic and atraumatic.     Right Ear: Hearing and external ear normal.     Left Ear: Hearing and external ear normal.     Ears:     Comments: Wearing hearing aides    Mouth/Throat:     Mouth: Mucous membranes are moist.     Pharynx: Oropharynx is clear. No oropharyngeal exudate or posterior oropharyngeal erythema.  Eyes:     General: No scleral icterus.    Extraocular Movements: Extraocular movements intact.     Conjunctiva/sclera: Conjunctivae normal.     Pupils: Pupils are equal, round, and reactive to light.  Neck:     Thyroid : No thyroid  mass or thyromegaly.  Cardiovascular:     Rate and Rhythm: Normal rate and regular rhythm.     Pulses: Normal pulses.          Radial pulses are 2+ on the right side and 2+ on the left side.     Heart sounds: Normal heart sounds. No murmur heard. Pulmonary:     Effort: Pulmonary  effort is normal. No respiratory distress.     Breath sounds: Normal breath sounds. No wheezing, rhonchi or rales.  Abdominal:     General: Bowel sounds are normal. There is no distension.     Palpations: Abdomen is soft. There is no mass.     Tenderness: There is no abdominal tenderness. There is no guarding or rebound.     Hernia: No hernia is present.  Musculoskeletal:        General: Normal range of motion.     Cervical back: Normal range of motion and neck supple.     Right lower leg: No edema.     Left lower leg: No edema.  Lymphadenopathy:     Cervical: No cervical adenopathy.  Skin:    General: Skin is warm and dry.     Findings: No rash.  Neurological:     General: No focal deficit present.     Mental Status: He is alert and oriented to person, place, and time.  Psychiatric:        Mood and Affect: Mood normal.        Behavior: Behavior normal.        Thought Content: Thought content normal.        Judgment: Judgment normal.       Results for orders placed or performed in visit on 10/07/23  Fructosamine   Collection Time: 10/07/23  9:50 AM  Result Value Ref Range   Fructosamine 354 (H) 205 - 285 umol/L  TSH   Collection Time: 10/07/23  9:50 AM  Result Value Ref Range   TSH 1.85 0.35 - 5.50 uIU/mL  CBC with Differential/Platelet   Collection Time: 10/07/23  9:50 AM  Result Value  Ref Range   WBC 4.4 4.0 - 10.5 K/uL   RBC 3.97 (L) 4.22 - 5.81 Mil/uL   Hemoglobin 11.8 (L) 13.0 - 17.0 g/dL   HCT 63.9 (L) 60.9 - 47.9 %   MCV 90.9 78.0 - 100.0 fl   MCHC 32.7 30.0 - 36.0 g/dL   RDW 85.7 88.4 - 84.4 %   Platelets 158.0 150.0 - 400.0 K/uL   Neutrophils Relative % 55.7 43.0 - 77.0 %   Lymphocytes Relative 34.5 12.0 - 46.0 %   Monocytes Relative 7.6 3.0 - 12.0 %   Eosinophils Relative 1.7 0.0 - 5.0 %   Basophils Relative 0.5 0.0 - 3.0 %   Neutro Abs 2.5 1.4 - 7.7 K/uL   Lymphs Abs 1.5 0.7 - 4.0 K/uL   Monocytes Absolute 0.3 0.1 - 1.0 K/uL   Eosinophils Absolute  0.1 0.0 - 0.7 K/uL   Basophils Absolute 0.0 0.0 - 0.1 K/uL  Parathyroid  hormone, intact (no Ca)   Collection Time: 10/07/23  9:50 AM  Result Value Ref Range   PTH 30 16 - 77 pg/mL  VITAMIN D  25 Hydroxy (Vit-D Deficiency, Fractures)   Collection Time: 10/07/23  9:50 AM  Result Value Ref Range   VITD 40.99 30.00 - 100.00 ng/mL  Phosphorus   Collection Time: 10/07/23  9:50 AM  Result Value Ref Range   Phosphorus 3.8 2.3 - 4.6 mg/dL  Comprehensive metabolic panel with GFR   Collection Time: 10/07/23  9:50 AM  Result Value Ref Range   Sodium 144 135 - 145 mEq/L   Potassium 3.8 3.5 - 5.1 mEq/L   Chloride 107 96 - 112 mEq/L   CO2 30 19 - 32 mEq/L   Glucose, Bld 157 (H) 70 - 99 mg/dL   BUN 38 (H) 6 - 23 mg/dL   Creatinine, Ser 8.40 (H) 0.40 - 1.50 mg/dL   Total Bilirubin 0.4 0.2 - 1.2 mg/dL   Alkaline Phosphatase 99 39 - 117 U/L   AST 17 0 - 37 U/L   ALT 11 0 - 53 U/L   Total Protein 6.1 6.0 - 8.3 g/dL   Albumin 3.9 3.5 - 5.2 g/dL   GFR 60.05 (L) >39.99 mL/min   Calcium  9.0 8.4 - 10.5 mg/dL   *Note: Due to a large number of results and/or encounters for the requested time period, some results have not been displayed. A complete set of results can be found in Results Review.    Assessment & Plan:   Problem List Items Addressed This Visit     Advanced care planning/counseling discussion - Primary (Chronic)   Advanced directives - full code but wouldn't want prolonged life support. Has updated living will with lawyer - will bring us  copy. HCPOA oldest son and granddaughter.       CAD (coronary artery disease)   Appreciate cardiology care.       Relevant Medications   atorvastatin  (LIPITOR ) 80 MG tablet   carvedilol  (COREG ) 6.25 MG tablet   losartan (COZAAR) 50 MG tablet   Hypertension   Chronic, stable on current regimen - continue this.  Recently started on losartan 50mg  daily by nephrology      Relevant Medications   atorvastatin  (LIPITOR ) 80 MG tablet   carvedilol   (COREG ) 6.25 MG tablet   losartan (COZAAR) 50 MG tablet   Hypothyroidism   Chronic, stable. Continue levothyroxine  88mcg daily.       Relevant Medications   carvedilol  (COREG ) 6.25 MG tablet   levothyroxine  (SYNTHROID ) 88  MCG tablet   Hyperlipidemia associated with type 2 diabetes mellitus (HCC)   Chronic, stable period on high potency atorvastatin  MWF - continue. The ASCVD Risk score (Arnett DK, et al., 2019) failed to calculate for the following reasons:   The 2019 ASCVD risk score is only valid for ages 7 to 79       Relevant Medications   atorvastatin  (LIPITOR ) 80 MG tablet   carvedilol  (COREG ) 6.25 MG tablet   losartan (COZAAR) 50 MG tablet   CKD (chronic kidney disease) stage 3, GFR 30-59 ml/min (HCC)   Has established with [facility] nephrology office Dr Melia      Type 2 diabetes mellitus with diabetic chronic kidney disease (HCC)   Relevant Medications   atorvastatin  (LIPITOR ) 80 MG tablet   losartan (COZAAR) 50 MG tablet   RESOLVED: Type 2 diabetes mellitus with diabetic neuropathy, unspecified (HCC)   Relevant Medications   atorvastatin  (LIPITOR ) 80 MG tablet   losartan (COZAAR) 50 MG tablet   Chronic insomnia   Chronic, stable period on trazodone  100mg  daily - continue this.       Relevant Medications   traZODone  (DESYREL ) 100 MG tablet   CLL (chronic lymphocytic leukemia) (HCC)   Appreciate onc care.       Glaucoma   Regularly sees Dr Octavia      Carotid stenosis, asymptomatic, bilateral   Latest carotid US  stable. 09/2023      Relevant Medications   atorvastatin  (LIPITOR ) 80 MG tablet   carvedilol  (COREG ) 6.25 MG tablet   losartan (COZAAR) 50 MG tablet   Hearing loss   Regularly wearing hearing aides      Exocrine pancreatic insufficiency   On Creon  with benefit.  Follows with Dr Unk- will refer back to her to re establish at [facility]      Relevant Orders   Ambulatory referral to Gastroenterology   Type 2 diabetes mellitus with  diabetic polyneuropathy, with long-term current use of insulin  (HCC)   Continue gabapentin  100mg  daily.       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 - monitoring with CGM      Relevant Medications   atorvastatin  (LIPITOR ) 80 MG tablet   losartan (COZAAR) 50 MG tablet   Other Visit Diagnoses       Dizziness         Need for vaccination against Streptococcus pneumoniae       Relevant Orders   Pneumococcal conjugate vaccine 20-valent (Prevnar 20) (Completed)        Meds ordered this encounter  Medications   atorvastatin  (LIPITOR ) 80 MG tablet    Sig: TAKE ONE TABLET BY MOUTH ON MONDAY, WEDNESDAY, AND FRIDAY    Dispense:  39 tablet    Refill:  3   carvedilol  (COREG ) 6.25 MG tablet    Sig: Take 1 tablet (6.25 mg total) by mouth 2 (two) times daily with a meal.    Dispense:  180 tablet    Refill:  3   levothyroxine  (SYNTHROID ) 88 MCG tablet    Sig: Take 1 tablet (88 mcg total) by mouth daily.    Dispense:  90 tablet    Refill:  3   traZODone  (DESYREL ) 100 MG tablet    Sig: Take 1 tablet (100 mg total)  by mouth at bedtime as needed. for sleep    Dispense:  90 tablet    Refill:  4    Orders Placed This Encounter  Procedures   Pneumococcal conjugate vaccine 20-valent (Prevnar 20)   Ambulatory referral to Gastroenterology    Referral Priority:   Routine    Referral Type:   Consultation    Referral Reason:   Specialty Services Required    Number of Visits Requested:   1    Patient Instructions  Prevnar-20 today New referral placed to [facility] clinic GI  to see Dr Unk 02/2024 Consider RSV and shingles shots through your pharmacy.  Bring us  copy of your living will.  Call us  with name and dose of latest blood pressure medicine started by kidney doctor (  in place of amlodipine ) Return in 6 months for follow up visit.   Follow up plan: Return in about 6 months (around 04/15/2024) for follow up visit.  Anton Blas, MD

## 2023-10-14 NOTE — Patient Instructions (Addendum)
 Prevnar-20 today New referral placed to [facility] clinic GI  to see Dr Unk 02/2024 Consider RSV and shingles shots through your pharmacy.  Bring us  copy of your living will.  Call us  with name and dose of latest blood pressure medicine started by kidney doctor ( in place of amlodipine ) Return in 6 months for follow up visit.

## 2023-10-14 NOTE — Telephone Encounter (Signed)
 Pt came by office requesting our office to change his POA from one family member to another family member? Let pt know we cannot change any names or info on his POA ppw or change athe name of his POA, his attorney would have to assist him with this matter. Pt states he didn't want to have to pay his attorney a lot of money to have the name changed on the ppw. Let pt know due to the issue being a legal matter, our office could not assist. Pt also wanted let Dr. KANDICE know he was currently taking Losartan 50 mg. Pt states he spoke to Dr. KANDICE before about being on a medication that wasn't listed on his med list & was advised by Dr. KANDICE to let our office know the name/dosage, so the meds could be added to his chart. Pt did not provide the prescriber name who started on him meds. FYI call back # [phone]

## 2023-10-14 NOTE — Telephone Encounter (Signed)
 Copied from CRM [id number]. Topic: Clinical - Order For Equipment >> Oct 14, 2023 11:52 AM Robinson DEL wrote: Reason for CRM: [facility] calling to follow up on prior authorization faxed to office in July for patients back and knee braces.  [facility] [phone] Ext [phone]

## 2023-10-14 NOTE — Assessment & Plan Note (Addendum)
 Advanced directives - full code but wouldn't want prolonged life support. Has updated living will with lawyer - will bring us  copy. HCPOA oldest son and granddaughter.

## 2023-10-15 ENCOUNTER — Encounter: Payer: Self-pay | Admitting: Family Medicine

## 2023-10-15 NOTE — Assessment & Plan Note (Signed)
Regularly wearing hearing aides.

## 2023-10-15 NOTE — Addendum Note (Signed)
 Addended by: RILLA BALLER on: 10/15/2023 07:39 AM   Modules accepted: Level of Service

## 2023-10-15 NOTE — Assessment & Plan Note (Deleted)
Continue gabapentin 100mg  daily.

## 2023-10-15 NOTE — Assessment & Plan Note (Signed)
Regularly sees Dr Dione Booze.

## 2023-10-15 NOTE — Assessment & Plan Note (Signed)
 Chronic, stable period on trazodone  100mg  daily - continue this.

## 2023-10-15 NOTE — Assessment & Plan Note (Signed)
 Has established with [facility] nephrology office Dr Melia

## 2023-10-15 NOTE — Assessment & Plan Note (Signed)
 On Creon  with benefit.  Follows with Dr Unk- will refer back to her to re establish at [facility]

## 2023-10-15 NOTE — Telephone Encounter (Signed)
 Noted. Mailed Advance Directive packet to pt.   Spoke with pt relaying Dr KANDICE 's message. Pt verbalizes understanding and expresses his thanks.

## 2023-10-15 NOTE — Assessment & Plan Note (Signed)
Chronic, stable. Continue levothyroxine 88mcg daily.  

## 2023-10-15 NOTE — Telephone Encounter (Addendum)
 Noted. Med list updated with losartan 50mg . May mail advanced directive packet - he may update HCPOA form and get notarized for his and our records. Please notify patient.

## 2023-10-15 NOTE — Assessment & Plan Note (Signed)
 Appreciate onc care.

## 2023-10-15 NOTE — Assessment & Plan Note (Signed)
Continue gabapentin 100mg  daily.

## 2023-10-15 NOTE — Assessment & Plan Note (Signed)
 Latest carotid US  stable. 09/2023

## 2023-10-15 NOTE — Assessment & Plan Note (Addendum)
 Chronic, stable on current regimen - continue this.  Recently started on losartan 50mg  daily by nephrology

## 2023-10-15 NOTE — Assessment & Plan Note (Signed)
Appreciate cardiology care.  °

## 2023-10-15 NOTE — Telephone Encounter (Addendum)
 Pt had OV today and discussed PA with Dr KANDICE. Per Dr KANDICE, pt decided he does not want braces and asks that [facility] stop contacting him and our office.  Message above also faxed, on 10/14/23, to [facility] with Dr Talmadge initials.   Lvm for ext [phone] asking them to call back. Plz relay info above.

## 2023-10-15 NOTE — Assessment & Plan Note (Signed)
 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

## 2023-10-15 NOTE — Assessment & Plan Note (Addendum)
 Chronic, stable period on high potency atorvastatin  MWF - continue. The ASCVD Risk score (Arnett DK, et al., 2019) failed to calculate for the following reasons:   The 2019 ASCVD risk score is only valid for ages 47 to 43

## 2023-10-16 NOTE — Telephone Encounter (Addendum)
 Pt had OV today and discussed PA with Dr KANDICE. Per Dr KANDICE, pt decided he does not want braces and asks that [facility] stop contacting him and our office.  Message above also faxed, on 10/14/23, to [facility] with Dr Talmadge initials.   Lvm for ext [phone] asking them to call back. Plz relay info above.  Closing encounter.

## 2023-10-20 ENCOUNTER — Emergency Department

## 2023-10-20 ENCOUNTER — Other Ambulatory Visit: Payer: Self-pay

## 2023-10-20 ENCOUNTER — Emergency Department
Admission: EM | Admit: 2023-10-20 | Discharge: 2023-10-20 | Disposition: A | Discharge status: Home / Self Care | Attending: Emergency Medicine | Admitting: Emergency Medicine

## 2023-10-20 DIAGNOSIS — I2510 Atherosclerotic heart disease of native coronary artery without angina pectoris: Secondary | ICD-10-CM | POA: Diagnosis not present

## 2023-10-20 DIAGNOSIS — E1122 Type 2 diabetes mellitus with diabetic chronic kidney disease: Secondary | ICD-10-CM | POA: Diagnosis not present

## 2023-10-20 DIAGNOSIS — R519 Headache, unspecified: Secondary | ICD-10-CM

## 2023-10-20 DIAGNOSIS — N189 Chronic kidney disease, unspecified: Secondary | ICD-10-CM | POA: Diagnosis not present

## 2023-10-20 DIAGNOSIS — I129 Hypertensive chronic kidney disease with stage 1 through stage 4 chronic kidney disease, or unspecified chronic kidney disease: Secondary | ICD-10-CM | POA: Insufficient documentation

## 2023-10-20 DIAGNOSIS — I10 Essential (primary) hypertension: Secondary | ICD-10-CM

## 2023-10-20 LAB — BASIC METABOLIC PANEL WITH GFR
Anion gap: 12 (ref 5–15)
BUN: 29 mg/dL — ABNORMAL HIGH (ref 8–23)
CO2: 26 mmol/L (ref 22–32)
Calcium: 9.5 mg/dL (ref 8.9–10.3)
Chloride: 105 mmol/L (ref 98–111)
Creatinine, Ser: 1.59 mg/dL — ABNORMAL HIGH (ref 0.61–1.24)
GFR, Estimated: 43 mL/min — ABNORMAL LOW (ref >60)
Glucose, Bld: 230 mg/dL — ABNORMAL HIGH (ref 70–99)
Potassium: 4.2 mmol/L (ref 3.5–5.1)
Sodium: 143 mmol/L (ref 135–145)

## 2023-10-20 LAB — CBC
HCT: 37.9 % — ABNORMAL LOW (ref 39.0–52.0)
Hemoglobin: 12.4 g/dL — ABNORMAL LOW (ref 13.0–17.0)
MCH: 29.8 pg (ref 26.0–34.0)
MCHC: 32.7 g/dL (ref 30.0–36.0)
MCV: 91.1 fL (ref 80.0–100.0)
Platelets: 168 K/uL (ref 150–400)
RBC: 4.16 MIL/uL — ABNORMAL LOW (ref 4.22–5.81)
RDW: 13.2 % (ref 11.5–15.5)
WBC: 4.6 K/uL (ref 4.0–10.5)
nRBC: 0 % (ref 0.0–0.2)

## 2023-10-20 MED ORDER — SODIUM CHLORIDE 0.9 % IV BOLUS
500.0000 mL | Freq: Once | INTRAVENOUS | Status: AC
  Administered 2023-10-20: 500 mL via INTRAVENOUS

## 2023-10-20 MED ORDER — LABETALOL HCL 5 MG/ML IV SOLN
10.0000 mg | Freq: Once | INTRAVENOUS | Status: AC
  Administered 2023-10-20: 10 mg via INTRAVENOUS
  Filled 2023-10-20: qty 4

## 2023-10-20 MED ORDER — AMOXICILLIN-POT CLAVULANATE 875-125 MG PO TABS: 1.0000 | ORAL_TABLET | Freq: Two times a day (BID) | ORAL | 0 refills | Status: AC

## 2023-10-20 NOTE — ED Provider Notes (Signed)
 [facility] Provider Note    Event Date/Time   First MD Initiated Contact with Patient 10/20/23 1010     (approximate)   History   Headache   HPI  Scott [name] is a 83 y.o. male with a history of CAD, carotid artery disease, CKD, CLL, hypertension diabetes who presents with complaints of headaches.  Patient reports over the last month he has had intermittent left-sided headaches, primarily frontal.  They seem to respond to Tylenol  but returned almost daily.  He reports this is atypical for him.  No neurodeficits.  No fevers reported.  He reports his kidney doctor recently changed his blood pressure medications which is why he thinks his blood pressure is higher than typical     Physical Exam   Triage Vital Signs: ED Triage Vitals  Encounter Vitals Group     BP 10/20/23 0941 (!) 199/92     Girls Systolic BP Percentile --      Girls Diastolic BP Percentile --      Boys Systolic BP Percentile --      Boys Diastolic BP Percentile --      Pulse Rate 10/20/23 0941 66     Resp 10/20/23 0941 17     Temp 10/20/23 0941 97.8 F (36.6 C)     Temp Source 10/20/23 0941 Oral     SpO2 10/20/23 0941 97 %     Weight 10/20/23 0945 65.8 kg (145 lb)     Height 10/20/23 0945 1.803 m (5' 11)     Head Circumference --      Peak Flow --      Pain Score 10/20/23 0941 4     Pain Loc --      Pain Education --      Exclude from Growth Chart --     Most recent vital signs: Vitals:   10/20/23 1016 10/20/23 1131  BP: (!) 215/93 (!) 192/90  Pulse: 68 70  Resp: 16 16  Temp:    SpO2: 97% 97%     General: Awake, no distress.  Well-appearing, pleasant, alert and oriented CV:  Good peripheral perfusion.  Resp:  Normal effort.  Abd:  No distention.  Other:  All cranial nerves are normal, normal neurologic exam   ED Results / Procedures / Treatments   Labs (all labs ordered are listed, but only abnormal results are displayed) Labs Reviewed  CBC - Abnormal;  Notable for the following components:      Result Value   RBC 4.16 (*)    Hemoglobin 12.4 (*)    HCT 37.9 (*)    All other components within normal limits  BASIC METABOLIC PANEL WITH GFR - Abnormal; Notable for the following components:   Glucose, Bld 230 (*)    BUN 29 (*)    Creatinine, Ser 1.59 (*)    GFR, Estimated 43 (*)    All other components within normal limits     EKG ED ECG REPORT I, Lamar Price, the attending physician, personally viewed and interpreted this ECG.  Date: 10/20/2023  Rhythm: normal sinus rhythm QRS Axis: normal Intervals: normal ST/T Wave abnormalities: normal Narrative Interpretation: no evidence of acute ischemia   RADIOLOGY CT head demonstrates worsening chronic left-sided sinusitis, this could be a cause of his headache    PROCEDURES:  Critical Care performed:   Procedures   MEDICATIONS ORDERED IN ED: Medications  labetalol  (NORMODYNE ) injection 10 mg (10 mg Intravenous Given 10/20/23 1048)  sodium chloride   0.9 % bolus 500 mL (0 mLs Intravenous Stopped 10/20/23 1131)     IMPRESSION / MDM / ASSESSMENT AND PLAN / ED COURSE  I reviewed the triage vital signs and the nursing notes. Patient's presentation is most consistent with severe exacerbation of chronic illness.  Patient presents with left-sided frontal headache as detailed above, overall well-appearing and in no acute distress, no neuro deficits  He is hypertensive here but this appears to be a chronic issue likely related to change in his medications.  I doubt this is contributing to his headache but it is certainly possible care.  Will give IV labetalol  here  Lab work is reassuring, not significantly changed from prior, sent for CT head which demonstrates worsening sinus disease could certainly be a cause given that the sinus disease is on the left where he is having his headache.  Will treat with Augmentin , referral to ENT to for further evaluation, close follow-up with PCP  within 1 week to recheck blood pressure, he agrees with this plan, he knows that he may require further imaging as well      FINAL CLINICAL IMPRESSION(S) / ED DIAGNOSES   Final diagnoses:  Acute nonintractable headache, unspecified headache type  Sinus headache  Primary hypertension     Rx / DC Orders   ED Discharge Orders          Ordered    amoxicillin -clavulanate (AUGMENTIN ) 875-125 MG tablet  2 times daily        10/20/23 1115             Note:  This document was prepared using Dragon voice recognition software and may include unintentional dictation errors.   Arlander Charleston, MD 10/20/23 [phone]

## 2023-10-20 NOTE — ED Triage Notes (Addendum)
 Pt arrives via POV with c/o a headache that's been bothering him for 2-3 weeks. Pt states that they have a blood vessel on the left side of their head that they believe is getting longer. Pt reports never having HA's and this one is making them nervous. Pt's pain is a 4/10 after taking tylenol  this morning to help with the pain. Pt's BP medication was changed 2 weeks ago by the kidney doctor because they have too much protein in their urine. Pt took BP med this morning. Pt is A&Ox4 and ambulatory during triage.

## 2023-10-20 NOTE — Discharge Instructions (Addendum)
 Your CT scan was overall reassuring, you do have evidence of worsening sinusitis in your sphenoid sinus, this could certainly cause the headache that you are describing.  We will treat with Augmentin , refer you to ENT but also please follow-up with your PCP for blood pressure recheck as your blood pressure is higher than typical today which may be related to the recent adjustment of your blood pressure medication

## 2023-10-30 ENCOUNTER — Encounter: Payer: Self-pay | Admitting: *Deleted

## 2023-11-16 ENCOUNTER — Other Ambulatory Visit: Payer: Self-pay | Admitting: Family Medicine

## 2023-11-16 ENCOUNTER — Other Ambulatory Visit: Payer: Self-pay | Admitting: Cardiovascular Disease

## 2023-11-16 DIAGNOSIS — I10 Essential (primary) hypertension: Secondary | ICD-10-CM

## 2023-11-21 ENCOUNTER — Inpatient Hospital Stay: Attending: Oncology

## 2023-11-21 DIAGNOSIS — C9110 Chronic lymphocytic leukemia of B-cell type not having achieved remission: Secondary | ICD-10-CM | POA: Diagnosis not present

## 2023-11-21 LAB — CBC WITH DIFFERENTIAL (CANCER CENTER ONLY)
Abs Immature Granulocytes: 0.02 K/uL (ref 0.00–0.07)
Basophils Absolute: 0 K/uL (ref 0.0–0.1)
Basophils Relative: 0 %
Eosinophils Absolute: 0.1 K/uL (ref 0.0–0.5)
Eosinophils Relative: 2 %
HCT: 34.3 % — ABNORMAL LOW (ref 39.0–52.0)
Hemoglobin: 10.9 g/dL — ABNORMAL LOW (ref 13.0–17.0)
Immature Granulocytes: 0 %
Lymphocytes Relative: 22 %
Lymphs Abs: 1 K/uL (ref 0.7–4.0)
MCH: 29.7 pg (ref 26.0–34.0)
MCHC: 31.8 g/dL (ref 30.0–36.0)
MCV: 93.5 fL (ref 80.0–100.0)
Monocytes Absolute: 0.3 K/uL (ref 0.1–1.0)
Monocytes Relative: 6 %
Neutro Abs: 3.3 K/uL (ref 1.7–7.7)
Neutrophils Relative %: 70 %
Platelet Count: 142 K/uL — ABNORMAL LOW (ref 150–400)
RBC: 3.67 MIL/uL — ABNORMAL LOW (ref 4.22–5.81)
RDW: 12.7 % (ref 11.5–15.5)
WBC Count: 4.7 K/uL (ref 4.0–10.5)
nRBC: 0 % (ref 0.0–0.2)

## 2023-12-10 ENCOUNTER — Other Ambulatory Visit: Payer: Self-pay | Admitting: Internal Medicine

## 2023-12-10 ENCOUNTER — Other Ambulatory Visit: Payer: Self-pay | Admitting: Cardiovascular Disease

## 2023-12-10 ENCOUNTER — Other Ambulatory Visit: Payer: Self-pay | Admitting: Family Medicine

## 2023-12-10 DIAGNOSIS — M545 Low back pain, unspecified: Secondary | ICD-10-CM

## 2023-12-10 DIAGNOSIS — R42 Dizziness and giddiness: Secondary | ICD-10-CM

## 2023-12-10 DIAGNOSIS — E1169 Type 2 diabetes mellitus with other specified complication: Secondary | ICD-10-CM

## 2023-12-10 DIAGNOSIS — I10 Essential (primary) hypertension: Secondary | ICD-10-CM

## 2023-12-11 NOTE — Telephone Encounter (Signed)
 ERx

## 2023-12-11 NOTE — Progress Notes (Unsigned)
 Cardiology Office Note    Date:  12/11/2023   ID:  [name], Scott 1940-07-01, MRN [id number]  PCP:  Rilla Baller, MD  Cardiologist:  None  Electrophysiologist:  None   Chief Complaint: ***  History of Present Illness:   Scott [name] is a 83 y.o. male with history of coronary artery disease s/p remote PTCA diabetes peripheral neuropathy, CKD stage III, bradycardia, CLL, carotid artery disease, and hypertension who presents for follow-up on***.    Remote PTCA in 1994 with chronic reocclusion of distal RCA with collaterals from circumflex and 1995 PTCA of DX to OM 2 proximal concentric stenosis.  Admitted in 2013 for chest pain, ruled out for ACS with exercise stress test negative for ischemia or prior scar.  There is a fixed small mild apical septal perfusion defect with normal wall motion likely representing apical thinning.  Echo was unremarkable.  He had nuclear stress testing in 11/2015 which was felt to be low risk with mild apical thinning and mild apical ischemia with EF 58%.  Lexiscan  Myoview  completed 06/2021 was low risk with no significant ischemia, EF estimated at 47%.  There was a small fixed apical defect likely attenuation artifact but unable to exclude small region of infarct.  He has not historically been continued on aspirin  due to history of ulcer/GI bleed, low platelets, and nosebleeds.   Patient was most recently seen in our office 08/27/2022 by Dr. Gollan.  He had lost 10 pounds since his prior visit a year ago.  Overall doing well from a cardiac perspective.  Hydrochlorothiazide  was held in the setting of chronic renal dysfunction and weight loss.  ***  Labs independently reviewed: 11/2023-Hgb 10.9, HCT 34.3, platelets 142 10/2023-sodium 143, potassium 4.2, BUN 29, creatinine 1.59 08/2023-TC 170, HDL 39, TG 120, LDL 57  Objective   Past Medical History:  Diagnosis Date   Acquired hand deformity 1962   hand saw accident at work   Anemia 10/11/2011    Arthritis    Bradycardia 10/10/2011   CAD (coronary artery disease) 10/10/2011   MI s/p PTCA (Dx-OM2 proximal concentric stenosis)   Campylobacter diarrhea 07/16/2022   Carotid artery disease    Cellulitis of buttock, left 11/17/2018   Chronic edema    CKD (chronic kidney disease) stage 3, GFR 30-59 ml/min (HCC)    [name]   CLL (chronic lymphocytic leukemia) (HCC)    Colon polyps    Cough 04/28/2019   Dysphagia    EGD - reflux esophagitis, one area of stomach with focal intestinal metaplasia (06/2019) Dr Unk   Generalized headaches    frequent   GI bleed 07/08/2017   Glaucoma    s/p laser surgery   HLD (hyperlipidemia)    Hyperkalemia 02/28/2018   Hypertension    Hypothyroidism 10/10/2011   Iron  deficiency anemia due to chronic blood loss    Squamous cell carcinoma of skin 06/15/2019   Right posterior ear. SCCis, hypertrophic, crusted   Squamous cell carcinoma of skin 09/26/2022   Left mid helix, mohs 10/31/22. Moderately differentiated, adenoid variant, crusted, deep margin involved.   Sternal fracture 09/13/2017   Thrombocytopenia 10/11/2011   Type 2 diabetes with nephropathy Scott [name])    DM refresher course [facility] (04/2013)    Current Medications: No outpatient medications have been marked as taking for the 12/12/23 encounter (Appointment) with Vivienne Lonni Ingle, NP.    Allergies:   Lisinopril  and Prednisone    Social History   Socioeconomic History   Marital status: Widowed  Spouse name: Not on file   Number of children: Not on file   Years of education: Not on file   Highest education level: 12th grade  Occupational History   Not on file  Tobacco Use   Smoking status: Former    Current packs/day: 0.00    Types: Cigarettes    Quit date: 03/04/1978    Years since quitting: 45.8    Passive exposure: Past   Smokeless tobacco: Never  Vaping Use   Vaping status: Never Used  Substance and Sexual Activity   Alcohol use: No   Drug use: No   Sexual  activity: Never  Other Topics Concern   Not on file  Social History Narrative   Widower. Wife passed 08/2015 from cancer. 1 dog   Occupation: retired, was route Medical illustrator   Edu: 11th gr   Activity: walks dog (pomeranian) 3-4x/day, yardwork, rides bicycle.   Diet: drinks diet coke, no vegetables   Social Drivers of Corporate investment banker Strain: Low Risk  (10/11/2023)   Overall Financial Resource Strain (CARDIA)    Difficulty of Paying Living Expenses: Not very hard  Food Insecurity: No Food Insecurity (10/11/2023)   Hunger Vital Sign    Worried About Running Out of Food in the Last Year: Never true    Ran Out of Food in the Last Year: Never true  Transportation Needs: No Transportation Needs (10/11/2023)   PRAPARE - Administrator, Civil Service (Medical): No    Lack of Transportation (Non-Medical): No  Physical Activity: Unknown (10/11/2023)   Exercise Vital Sign    Days of Exercise per Week: Patient declined    Minutes of Exercise per Session: Not on file  Stress: No Stress Concern Present (05/26/2023)   [employer] of Occupational Health - Occupational Stress Questionnaire    Feeling of Stress : Not at all  Social Connections: Socially Isolated (10/11/2023)   Social Connection and Isolation Panel    Frequency of Communication with Friends and Family: More than three times a week    Frequency of Social Gatherings with Friends and Family: Twice a week    Attends Religious Services: Never    Database administrator or Organizations: No    Attends Engineer, structural: Not on file    Marital Status: Widowed     Family History:  The patient's family history includes Coronary artery disease (age of onset: 64) in his son; Diabetes in his sister; Hyperlipidemia in his sister; Other in his brother; Pneumonia in his father; Stomach cancer (age of onset: 48) in his sister. There is no history of Stroke or Heart attack.  ROS:   12-point review of systems is  negative unless otherwise noted in the HPI.  EKGs/Other Studies Reviewed:    Studies reviewed were summarized above. The additional studies were reviewed today:  09/2023 Carotid doppler Right Carotid: Velocities in the right ICA are consistent with a 1-39% stenosis.   Left Carotid: Velocities in the left ICA are consistent with a 1-39% stenosis. Unable to reproduce ICA velocity from prior exam. Non-hemodynamically significant plaque <50% noted in the CCA.   Vertebrals: Bilateral vertebral arteries demonstrate antegrade flow.  Subclavians: Normal flow hemodynamics were seen in bilateral subclavian arteries.   06/2021 Lexiscan  myoview  Pharmacological myocardial perfusion imaging study with no significant  ischemia Small fixed apical defect, likely attenuation artifact, unable to exclude small region of infarct Apical defect not particularly notable on nonattenuation corrected images GI uptake artifact noted  Normal wall motion, EF estimated at 47% No EKG changes concerning for ischemia at peak stress or in recovery. CT attenuation correction images with three-vessel coronary calcification, mild to moderate aortic atherosclerosis Low risk scan  EKG:  EKG personally reviewed by me today    PHYSICAL EXAM:    VS:  There were no vitals taken for this visit.  BMI: There is no height or weight on file to calculate BMI.  GEN: Well nourished, well developed in no acute distress NECK: No JVD; No carotid bruits CARDIAC: ***RRR, no murmurs, rubs, gallops RESPIRATORY:  Clear to auscultation without rales, wheezing or rhonchi  ABDOMEN: Soft, non-tender, non-distended EXTREMITIES:  *** No edema; No deformity  Wt Readings from Last 3 Encounters:  10/20/23 145 lb (65.8 kg)  10/14/23 145 lb (65.8 kg)  08/12/23 146 lb (66.2 kg)                  ASSESSMENT & PLAN:   Coronary artery disease   Carotid artery disease   Essential hypertension   CLL   {Are you ordering a CV Procedure  (e.g. stress test, cath, DCCV, TEE, etc)?   Press F2        :[id number]}   Disposition: F/u with Dr. Gollan or an APP in ***.   Medication Adjustments/Labs and Tests Ordered: Current medicines are reviewed at length with the patient today.  Concerns regarding medicines are outlined above. Medication changes, Labs and Tests ordered today are summarized above and listed in the Patient Instructions accessible in Encounters.   Signed, Lesley Maffucci, PA-C 12/11/2023 4:37 PM     [facility] [place] [address] [place], [place] [address] [phone]

## 2023-12-11 NOTE — Telephone Encounter (Signed)
 Please contact pt for future appointment. Pt overdue for follow up needing refills.

## 2023-12-12 ENCOUNTER — Encounter: Payer: Self-pay | Admitting: Nurse Practitioner

## 2023-12-12 ENCOUNTER — Ambulatory Visit: Attending: Nurse Practitioner | Admitting: Physician Assistant

## 2023-12-12 DIAGNOSIS — E785 Hyperlipidemia, unspecified: Secondary | ICD-10-CM | POA: Insufficient documentation

## 2023-12-12 DIAGNOSIS — I10 Essential (primary) hypertension: Secondary | ICD-10-CM | POA: Insufficient documentation

## 2023-12-12 DIAGNOSIS — E7849 Other hyperlipidemia: Secondary | ICD-10-CM | POA: Diagnosis not present

## 2023-12-12 DIAGNOSIS — I2510 Atherosclerotic heart disease of native coronary artery without angina pectoris: Secondary | ICD-10-CM | POA: Diagnosis not present

## 2023-12-12 MED ORDER — NITROGLYCERIN 0.4 MG SL SUBL
0.4000 mg | SUBLINGUAL_TABLET | SUBLINGUAL | 1 refills | Status: AC | PRN
Start: 2023-12-12 — End: ?

## 2023-12-12 MED ORDER — EZETIMIBE 10 MG PO TABS: 10.0000 mg | ORAL_TABLET | Freq: Every day | ORAL | 3 refills | Status: AC

## 2023-12-12 NOTE — Patient Instructions (Signed)
 Medication Instructions:   Your physician recommends that you continue on your current medications as directed. Please refer to the Current Medication list given to you today.    *If you need a refill on your cardiac medications before your next appointment, please call your pharmacy*  Lab Work:  None ordered at this time   If you have labs (blood work) drawn today and your tests are completely normal, you will receive your results only by:  MyChart Message (if you have MyChart) OR  A paper copy in the mail If you have any lab test that is abnormal or we need to change your treatment, we will call you to review the results.  Testing/Procedures:  None ordered at this time   Referrals:  None ordered at this time   Follow-Up:  At [facility], you and your health needs are our priority.  As part of our continuing mission to provide you with exceptional heart care, our providers are all part of one team.  This team includes your primary Cardiologist (physician) and Advanced Practice Providers or APPs (Physician Assistants and Nurse Practitioners) who all work together to provide you with the care you need, when you need it.  Your next appointment:   1 year(s)  Provider:    You may Dr. Gollan or one of the following Advanced Practice Providers on your designated Care Team:   Lonni Meager, NP Lesley Maffucci, PA-C Bernardino Bring, PA-C Cadence Starkweather, PA-C Tylene Lunch, NP Barnie Hila, NP    We recommend signing up for the patient portal called MyChart.  Sign up information is provided on this After Visit Summary.  MyChart is used to connect with patients for Virtual Visits (Telemedicine).  Patients are able to view lab/test results, encounter notes, upcoming appointments, etc.  Non-urgent messages can be sent to your provider as well.   To learn more about what you can do with MyChart, go to [email or web address].

## 2023-12-20 ENCOUNTER — Other Ambulatory Visit: Payer: Self-pay | Admitting: Family Medicine

## 2023-12-20 DIAGNOSIS — I10 Essential (primary) hypertension: Secondary | ICD-10-CM

## 2024-01-27 ENCOUNTER — Encounter: Payer: Self-pay | Admitting: Dermatology

## 2024-01-27 ENCOUNTER — Ambulatory Visit (INDEPENDENT_AMBULATORY_CARE_PROVIDER_SITE_OTHER): Admitting: Dermatology

## 2024-01-27 DIAGNOSIS — L570 Actinic keratosis: Secondary | ICD-10-CM

## 2024-01-27 DIAGNOSIS — C44219 Basal cell carcinoma of skin of left ear and external auricular canal: Secondary | ICD-10-CM | POA: Diagnosis not present

## 2024-01-27 DIAGNOSIS — D492 Neoplasm of unspecified behavior of bone, soft tissue, and skin: Secondary | ICD-10-CM

## 2024-01-27 DIAGNOSIS — W908XXA Exposure to other nonionizing radiation, initial encounter: Secondary | ICD-10-CM

## 2024-01-27 DIAGNOSIS — C4491 Basal cell carcinoma of skin, unspecified: Secondary | ICD-10-CM

## 2024-01-27 HISTORY — DX: Basal cell carcinoma of skin, unspecified: C44.91

## 2024-01-27 NOTE — Patient Instructions (Signed)
 Cryotherapy Aftercare  Wash gently with soap and water everyday.   Apply Aquaphor ointment daily until healed.      Wound Care Instructions  Cleanse wound gently with soap and water once a day then pat dry with clean gauze. Apply a thin coat of Petrolatum (petroleum jelly, Vaseline) over the wound (unless you have an allergy to this). We recommend that you use a new, sterile tube of Vaseline. Do not pick or remove scabs. Do not remove the yellow or white healing tissue from the base of the wound.  Cover the wound with fresh, clean, nonstick gauze and secure with paper tape. You may use Band-Aids in place of gauze and tape if the wound is small enough, but would recommend trimming much of the tape off as there is often too much. Sometimes Band-Aids can irritate the skin.  You should call the office for your biopsy report after 1 week if you have not already been contacted.  If you experience any problems, such as abnormal amounts of bleeding, swelling, significant bruising, significant pain, or evidence of infection, please call the office immediately.  FOR ADULT SURGERY PATIENTS: If you need something for pain relief you may take 1 extra strength Tylenol  (acetaminophen ) AND 2 Ibuprofen (200mg  each) together every 4 hours as needed for pain. (do not take these if you are allergic to them or if you have a reason you should not take them.) Typically, you may only need pain medication for 1 to 3 days.        Due to recent changes in healthcare laws, you may see results of your pathology and/or laboratory studies on MyChart before the doctors have had a chance to review them. We understand that in some cases there may be results that are confusing or concerning to you. Please understand that not all results are received at the same time and often the doctors may need to interpret multiple results in order to provide you with the best plan of care or course of treatment. Therefore, we ask that  you please give us  2 business days to thoroughly review all your results before contacting the office for clarification. Should we see a critical lab result, you will be contacted sooner.   If You Need Anything After Your Visit  If you have any questions or concerns for your doctor, please call our main line at [phone] and press option 4 to reach your doctor's medical assistant. If no one answers, please leave a voicemail as directed and we will return your call as soon as possible. Messages left after 4 pm will be answered the following business day.   You may also send us  a message via MyChart. We typically respond to MyChart messages within 1-2 business days.  For prescription refills, please ask your pharmacy to contact our office. Our fax number is [phone].  If you have an urgent issue when the clinic is closed that cannot wait until the next business day, you can page your doctor at the number below.    Please note that while we do our best to be available for urgent issues outside of office hours, we are not available 24/7.   If you have an urgent issue and are unable to reach us , you may choose to seek medical care at your doctor's office, retail clinic, urgent care center, or emergency room.  If you have a medical emergency, please immediately call 911 or go to the emergency department.  Pager Numbers  -  Dr. Hester: [phone]  - Dr. Jackquline: [phone]  - Dr. Claudene: [phone]   - Dr. Raymund: [phone]  In the event of inclement weather, please call our main line at [phone] for an update on the status of any delays or closures.  Dermatology Medication Tips: Please keep the boxes that topical medications come in in order to help keep track of the instructions about where and how to use these. Pharmacies typically print the medication instructions only on the boxes and not directly on the medication tubes.   If your medication is too expensive,  please contact our office at [phone] option 4 or send us  a message through MyChart.   We are unable to tell what your co-pay for medications will be in advance as this is different depending on your insurance coverage. However, we may be able to find a substitute medication at lower cost or fill out paperwork to get insurance to cover a needed medication.   If a prior authorization is required to get your medication covered by your insurance company, please allow us  1-2 business days to complete this process.  Drug prices often vary depending on where the prescription is filled and some pharmacies may offer cheaper prices.  The website www.goodrx.com contains coupons for medications through different pharmacies. The prices here do not account for what the cost may be with help from insurance (it may be cheaper with your insurance), but the website can give you the price if you did not use any insurance.  - You can print the associated coupon and take it with your prescription to the pharmacy.  - You may also stop by our office during regular business hours and pick up a GoodRx coupon card.  - If you need your prescription sent electronically to a different pharmacy, notify our office through [facility] or by phone at [phone] option 4.     Si Usted Necesita Algo Despus de Su Visita  Tambin puede enviarnos un mensaje a travs de Clinical Cytogeneticist. Por lo general respondemos a los mensajes de MyChart en el transcurso de 1 a 2 das hbiles.  Para renovar recetas, por favor pida a su farmacia que se ponga en contacto con nuestra oficina. Randi lakes de fax es [place] [phone].  Si tiene un asunto urgente cuando la clnica est cerrada y que no puede esperar hasta el siguiente da hbil, puede llamar/localizar a su doctor(a) al nmero que aparece a continuacin.   Por favor, tenga en cuenta que aunque hacemos todo lo posible para estar disponibles para asuntos urgentes fuera del  horario de [place], no estamos disponibles las 24 horas del da, los 7 [address]  [address] de la [place].   Si tiene un problema urgente y no puede comunicarse con nosotros, puede optar por buscar atencin mdica  en el consultorio de su doctor(a), en una clnica privada, en un centro de atencin urgente o en una sala de emergencias.  Si tiene engineer, drilling, por favor llame inmediatamente al 911 o vaya a la sala de emergencias.  Nmeros de bper  - Dr. Hester: [phone]  - Dra. Jackquline: [phone]  - Dr. Claudene: [phone]  - Dra. Kitts: [phone]  En caso de inclemencias del [place], por favor llame a nuestra lnea principal al [phone] para una actualizacin sobre el estado de cualquier retraso o cierre.  Consejos para la medicacin en dermatologa: Por favor, guarde las cajas en las que vienen los medicamentos de uso tpico para ayudarle a seguir las instrucciones sobre dnde  y cmo usarlos. Las farmacias generalmente imprimen las instrucciones del medicamento slo en las cajas y no directamente en los tubos del [place].   Si su medicamento es muy caro, por favor, pngase en contacto con landry rieger llamando al [phone] y presione la opcin 4 o envenos un mensaje a travs de Clinical Cytogeneticist.   No podemos decirle cul ser su copago por los medicamentos por adelantado ya que esto es diferente dependiendo de la cobertura de su seguro. Sin embargo, es posible que podamos encontrar un medicamento sustituto a audiological scientist un formulario para que el seguro cubra el medicamento que se considera necesario.   Si se requiere una autorizacin previa para que su compaa de seguros cubra su medicamento, por favor permtanos de 1 a 2 das hbiles para completar este proceso.  Los precios de los medicamentos varan con frecuencia dependiendo del environmental consultant de dnde se surte la receta y alguna farmacias pueden ofrecer precios ms baratos.  El sitio web www.goodrx.com tiene cupones para  medicamentos de health and safety inspector. Los precios aqu no tienen en cuenta lo que podra costar con la ayuda del seguro (puede ser ms barato con su seguro), pero el sitio web puede darle el precio si no utiliz tourist information centre manager.  - Puede imprimir el cupn correspondiente y llevarlo con su receta a la farmacia.  - Tambin puede pasar por nuestra oficina durante el horario de atencin regular y education officer, museum una tarjeta de cupones de GoodRx.  - Si necesita que su receta se enve electrnicamente a una farmacia diferente, informe a nuestra oficina a travs de MyChart de [place] o por telfono llamando al [phone] y presione la opcin 4.

## 2024-01-27 NOTE — Progress Notes (Signed)
   Follow-Up Visit   Subjective  Scott [name] is a 83 y.o. male who presents for the following: Spot on left earlobe. C/O sore knot. Has been applying Aquaphor. Not as painful.  Dur: 3-4 months.  The patient has spots, moles and lesions to be evaluated, some may be new or changing and the patient may have concern these could be cancer.    The following portions of the chart were reviewed this encounter and updated as appropriate: medications, allergies, medical history  Review of Systems:  No other skin or systemic complaints except as noted in HPI or Assessment and Plan.  Objective  Well appearing patient in no apparent distress; mood and affect are within normal limits.  A focused examination was performed of the following areas: Face, ears   Relevant physical exam findings are noted in the Assessment and Plan.  Left Antitragus x1, R temple x2, R post helical rim x1, R scapha x1, L antihelix x1, L zygoma x1, L preauricular x1 (8) Erythematous thin papules/macules with gritty scale.  Left Anterior Earlobe 7 mm pink papule with telangiectasias   Assessment & Plan   AK (ACTINIC KERATOSIS) (8) Left Antitragus x1, R temple x2, R post helical rim x1, R scapha x1, L antihelix x1, L zygoma x1, L preauricular x1 (8) Actinic keratoses are precancerous spots that appear secondary to cumulative UV radiation exposure/sun exposure over time. They are chronic with expected duration over 1 year. A portion of actinic keratoses will progress to squamous cell carcinoma of the skin. It is not possible to reliably predict which spots will progress to skin cancer and so treatment is recommended to prevent development of skin cancer.  Recommend daily broad spectrum sunscreen SPF 30+ to sun-exposed areas, reapply every 2 hours as needed.  Recommend staying in the shade or wearing long sleeves, sun glasses (UVA+UVB protection) and wide brim hats (4-inch brim around the entire circumference of the  hat). Call for new or changing lesions. Destruction of lesion - Left Antitragus x1, R temple x2, R post helical rim x1, R scapha x1, L antihelix x1, L zygoma x1, L preauricular x1 (8) NEOPLASM OF SKIN Left Anterior Earlobe Skin / nail biopsy Type of biopsy: tangential   Informed consent: discussed and consent obtained   Timeout: patient name, date of birth, surgical site, and procedure verified   Procedure prep:  Patient was prepped and draped in usual sterile fashion Prep type:  Isopropyl alcohol Anesthesia: the lesion was anesthetized in a standard fashion   Anesthetic:  1% lidocaine  w/ epinephrine 1-100,000 buffered w/ 8.4% NaHCO3 Instrument used: DermaBlade   Hemostasis achieved with: pressure and aluminum chloride   Outcome: patient tolerated procedure well   Post-procedure details: sterile dressing applied and wound care instructions given   Dressing type: bandage and petrolatum    Specimen 1 - Surgical pathology Differential Diagnosis: AK vs SCC vs BCC  Check Margins: No   Return for Follow Up As Scheduled.  I, Jill Parcell, CMA, am acting as scribe for Boneta Sharps, MD.   Documentation: I have reviewed the above documentation for accuracy and completeness, and I agree with the above.  Boneta Sharps, MD

## 2024-01-28 LAB — SURGICAL PATHOLOGY

## 2024-01-29 ENCOUNTER — Other Ambulatory Visit: Payer: Self-pay | Admitting: Family Medicine

## 2024-01-29 ENCOUNTER — Ambulatory Visit: Payer: Self-pay | Admitting: Dermatology

## 2024-01-29 DIAGNOSIS — I10 Essential (primary) hypertension: Secondary | ICD-10-CM

## 2024-01-29 DIAGNOSIS — C44219 Basal cell carcinoma of skin of left ear and external auricular canal: Secondary | ICD-10-CM

## 2024-02-02 ENCOUNTER — Telehealth: Payer: Self-pay

## 2024-02-02 NOTE — Telephone Encounter (Signed)
 The patient walked in to schedule an appointment. He shared that he had an appointment scheduled with Dr. Unk at [facility], but he mistakenly thought the appointment was at our office. He stated that he wishes to remain with our practice and does not want to switch providers. The patient reported that he will cancel his appointment with [facility]

## 2024-02-02 NOTE — Telephone Encounter (Signed)
-----   Message from [place] sent at 01/29/2024  8:48 AM EST ----- Diagnosis: left anterior earlobe :       BASAL CELL CARCINOMA, NODULAR AND INFILTRATIVE PATTERNS   Please call with diagnosis and determine where the patient would like to have Mohs surgery.  Explanation: your biopsy shows a basal cell skin cancer in the second layer of the skin. This is the most common kind of skin cancer and is caused by damage from sun exposure. Basal cell skin cancers  almost never spread beyond the skin, so they are not dangerous to your overall health. However, they will continue to grow, can bleed, cause nonhealing wounds, and disrupt nearby structures unless  fully treated.  Treatment: Given the location and type of skin cancer, I recommend Mohs surgery. Mohs surgery involves cutting out the skin cancer and then checking under the microscope to ensure the whole skin  cancer was removed. If any skin cancer remains, the surgeon will cut out more until it is fully removed. The cure rate is about 98-99%. Once the Mohs surgeon confirms the skin cancer is out, they  will discuss the options to repair or heal the area. You must take it easy for about two weeks after surgery (no lifting over 10-15 lbs, avoid activity to get your heart rate and blood pressure up).  It is done at another office outside of [place] ([place], [place], or [place]). ----- Message ----- From: Interface, Lab In Three Zero One Sent: 01/28/2024   4:04 PM EST To: Boneta Sharps, MD

## 2024-02-02 NOTE — Telephone Encounter (Signed)
 Discussed pathology results and treatment plan. Patient voiced understanding. Prefers referral to [facility], existing care relationship.

## 2024-02-02 NOTE — Telephone Encounter (Signed)
 Per 10/14/23 OV notes, amlodipine  10 mg was stopped by Dr KANDICE due to pt stating he was started on losartan 50 mg daily by nephrology.   Spoke with pt to confirm he's taking losartan vs amlodipine . Pt says he is taking losartan 50 mg daily, per kidney doc. Notified him of amlodipine  request and that it will be denied. Encouraged pt to call back if he has any changes in BP meds by kidney doc so we can keep his med list current. Pt verbalizes understanding.   Request denied.

## 2024-02-06 ENCOUNTER — Telehealth: Payer: Self-pay

## 2024-02-06 NOTE — Telephone Encounter (Signed)
 Copied from CRM #[id number]. Topic: General - Other >> Feb 06, 2024  3:26 PM Burnard DEL wrote: Reason for CRM: [facility] called in to see if office received order for lymphedema leg wrap? Order was faxed to office on 02/05/2024.

## 2024-02-06 NOTE — Telephone Encounter (Addendum)
 I have not received this request.  Would first check with patient to see if he requested this as he doesn't have lymphedema that I'm aware of?

## 2024-02-09 ENCOUNTER — Telehealth: Payer: Self-pay | Admitting: Family Medicine

## 2024-02-09 NOTE — Telephone Encounter (Signed)
 Copied from CRM [id number]. Topic: General - Other >> Feb 09, 2024  1:57 PM Harlene ORN wrote: Reason for CRM: [facility]  Fax was sent on 12/04, titled a reorder for a lymphedema wrap [facility]. Wants to know if it was received.  Phone: [phone]

## 2024-02-10 ENCOUNTER — Ambulatory Visit: Payer: Self-pay

## 2024-02-10 MED ORDER — AMLODIPINE BESYLATE 10 MG PO TABS: 10.0000 mg | ORAL_TABLET | Freq: Every day | ORAL | 1 refills | Status: DC

## 2024-02-10 NOTE — Telephone Encounter (Signed)
Already addressed via other message

## 2024-02-10 NOTE — Telephone Encounter (Signed)
 I've refilled amlodipine  10mg . Rec he start with 1/2 tablet 5mg  daily and may increase to whole 10mg  tablet daily if BP not controlled on 1/2 tablet.

## 2024-02-10 NOTE — Telephone Encounter (Signed)
 Patient called office states that you asked him to call and let you know if his kidney doctor asked him to start back on the amlodipine  10mg  . They did want him to start back on it. Would like you to call into [facility] [address].

## 2024-02-10 NOTE — Telephone Encounter (Signed)
 Called patient states he has some swelling at night was not sure if that would help.

## 2024-02-10 NOTE — Telephone Encounter (Signed)
 Copied from CRM #[id number]. Topic: General - Other >> Feb 10, 2024 11:28 AM Ashley R wrote: Reason for CRM: Requesting confirmation of fax sent this morning re: lymphedema wrap [facility]. Wants to know if it corrected version sent this morning was received,   Callback [phone] ext. [phone] for [facility]

## 2024-02-10 NOTE — Telephone Encounter (Signed)
 FYI Only or Action Required?: Action required by provider: medication refill request. Amlodipine , pt states that he was instructed by kidney doctor to stop the amlodipine , initially. Pt states that for 2-3 weeks his BP has been high and the kidney doctor advised him today to restart the amlodipine  in conjunction with the losartan. Pt is asymptomatic. Please call back.   Patient was last seen in primary care on 10/14/2023 by Rilla Baller, MD.  Called Nurse Triage reporting Hypertension.  Symptoms began several weeks ago.  Interventions attempted: Nothing.  Symptoms are: unchanged.  Triage Disposition: See Physician Within 24 Hours  Patient/caregiver understands and will follow disposition?: No, wishes to speak with PCP  Copied from CRM #[id number]. Topic: Clinical - Red Word Triage >> Feb 10, 2024  1:48 PM Harlene ORN wrote: Red Word that prompted transfer to Nurse Triage: latest BP reading this morning: 199 over 101; pulse: 56 Reason for Disposition  Systolic BP >= 180 OR Diastolic >= 110  Answer Assessment - Initial Assessment Questions 1. BLOOD PRESSURE: What is your blood pressure? Did you take at least two measurements 5 minutes apart?     199/101, 194/105 2. ONSET: When did you take your blood pressure?     States this has been ongoing for weeks and was waiting for kidney doctor to advise him 3. HOW: How did you take your blood pressure? (e.g., automatic home BP monitor, visiting nurse)     auto 4. HISTORY: Do you have a history of high blood pressure?     yes 5. MEDICINES: Are you taking any medicines for blood pressure? Have you missed any doses recently?     States losartan at this time but his kidney doctor states that he can restart the amlodipine  in conjunction with his losartan, call PCP for refills of amlodipine  6. OTHER SYMPTOMS: Do you have any symptoms? (e.g., blurred vision, chest pain, difficulty breathing, headache, weakness)     Denies   Pt  states his kidney doctor started him on losartan, stopping his amlodipine . Pt states his kidney doctor cleared him to start the amlodipine  again. Pt calling for rx refill.  Protocols used: Blood Pressure - High-A-AH

## 2024-02-10 NOTE — Telephone Encounter (Signed)
 Patient called to follow up about his amlodipine  prescribed by his kidney PCP. Please call back to discuss.

## 2024-02-10 NOTE — Telephone Encounter (Signed)
 Fax is in Dr. Talmadge [address] drive.  Called and spoke with [facility]. She is aware that we have this document and will fax it back once it's signed.

## 2024-02-10 NOTE — Addendum Note (Signed)
 Addended by: RILLA BALLER on: 02/10/2024 05:33 PM   Modules accepted: Orders

## 2024-02-11 ENCOUNTER — Encounter: Payer: Self-pay | Admitting: Internal Medicine

## 2024-02-11 ENCOUNTER — Ambulatory Visit: Admitting: Internal Medicine

## 2024-02-11 VITALS — BP 170/110 | Ht 71.0 in | Wt 151.0 lb

## 2024-02-11 DIAGNOSIS — E1165 Type 2 diabetes mellitus with hyperglycemia: Secondary | ICD-10-CM

## 2024-02-11 DIAGNOSIS — N1832 Chronic kidney disease, stage 3b: Secondary | ICD-10-CM

## 2024-02-11 DIAGNOSIS — E1122 Type 2 diabetes mellitus with diabetic chronic kidney disease: Secondary | ICD-10-CM

## 2024-02-11 DIAGNOSIS — E1142 Type 2 diabetes mellitus with diabetic polyneuropathy: Secondary | ICD-10-CM

## 2024-02-11 DIAGNOSIS — Z794 Long term (current) use of insulin: Secondary | ICD-10-CM

## 2024-02-11 LAB — POCT GLYCOSYLATED HEMOGLOBIN (HGB A1C): Hemoglobin A1C: 8.9 % — AB (ref 4.0–5.6)

## 2024-02-11 MED ORDER — INSULIN PEN NEEDLE 32G X 4 MM MISC: 1.0000 | Freq: Four times a day (QID) | 3 refills | Status: AC

## 2024-02-11 MED ORDER — BASAGLAR KWIKPEN 100 UNIT/ML ~~LOC~~ SOPN: 12.0000 [IU] | PEN_INJECTOR | Freq: Every day | SUBCUTANEOUS | 3 refills | Status: DC

## 2024-02-11 MED ORDER — NOVOLOG FLEXPEN 100 UNIT/ML ~~LOC~~ SOPN: PEN_INJECTOR | SUBCUTANEOUS | 2 refills | Status: AC

## 2024-02-11 NOTE — Telephone Encounter (Signed)
 Called patient reviewed all information and repeated back to me. Will call if any questions.  ? ?

## 2024-02-11 NOTE — Telephone Encounter (Signed)
 Patient declined appointment did not want to continue with order.

## 2024-02-11 NOTE — Progress Notes (Signed)
 Name: Scott [name]  MRN/ DOB: [id number], 05/02/1940   Age/ Sex: 83 y.o., male    PCP: Rilla Baller, MD   Reason for Endocrinology Evaluation: Type 2 Diabetes Mellitus     Date of Initial Endocrinology Visit: 09/27/2021    PATIENT IDENTIFIER: Scott [name] is a 83 y.o. male with a past medical history of T2DM, CLL and hypothyroidism. The patient presented for initial endocrinology clinic visit on 09/27/2021 for consultative assistance with his diabetes management.    HPI: Scott [name] was    Diagnosed with DM 1990 Prior Medications tried/Intolerance: metformin , glipizide  Hemoglobin A1c has ranged from 6.9% in 2019, peaking at 8.6% in 2023.    Patient with pancreatic insufficiency and is on Creon  Patient follows with cardiology for CAD He also follows with oncology for leukemia  On his initial visit to our clinic he had an A1c of 7.8%, he was already on MDI regimen which we adjusted, he was provided with a correction scale   He was started on Jardiance  through PCPs office 06/2022  SUBJECTIVE:   During the last visit (04/09/2023): A1c 7.8 %     Today (02/11/24): Scott [name] is here for a follow up on diabetes management. He checks his  blood sugars multiple times daily. The patient has not had hypoglycemic episodes since the last clinic visit.  Patient follows with cardiology for CAD , dyslipidemia as well as nonobstructive carotid artery disease and HTN  He did have a follow-up with Dr. Elner through ophthalmology on 09/24/2023 Continues to follow up with GI  for exocrine pancreatic insufficiency Patient had a follow-up with Dr. Melia with nephrology 09/2023 He continues to follow-up with oncology for CLL in remission   Pt has been noted with elevated BP, this has been managed by nephrology and PCP , he has a new medication ready to pick up from the pharmacy  No headaches  No SOB  Has occasional LE edema  No constipation or diarrhea     HOME DIABETES  REGIMEN: Lantus   10 units daily  NovoLog  5 units BID Correction factor: NovoLog  (BG -140/55) Gabapentin  100 mg at bedtime   CONTINUOUS GLUCOSE MONITORING RECORD INTERPRETATION    Dates of Recording: 11/27-12/12/2023  Sensor description:freestyle libre  Results statistics:   CGM use % of time 95  Average and SD 224/23.4  Time in range 24%  % Time Above 180 42  % Time above 250 34  % Time Below target 0    Glycemic patterns summary: BGs are elevated throughout the day and night  Hyperglycemic episodes all day and night  Hypoglycemic episodes occurred N/A  Overnight : Mostly elevated   DIABETIC COMPLICATIONS: Microvascular complications:  CKD III, neuropathy Denies: retinopathy Last eye exam: Completed 09/24/2023  Macrovascular complications:  CAD Denies: PVD, CVA   PAST HISTORY: Past Medical History:  Past Medical History:  Diagnosis Date   Acquired hand deformity 1962   hand saw accident at work   Anemia 10/11/2011   Arthritis    Basal cell carcinoma 01/27/2024   Left anterior earlobe. Nodular and infiltrative patterns. Mohs pending.   Bilateral renal cysts    a. 09/2023 Renal U/S: bilat complex renal cysts.   Bradycardia 10/10/2011   CAD (coronary artery disease) 10/10/2011   a. 10/2011 MI s/p PTCA (Dx-OM2 proximal concentric stenosis); b. 11/2015 MV: mild apical ischemia. EF 58%; b. 06/2021 MV: Small apical defect, likely attenuation. No ischemia. 3v cor Ca2+. EF 47%.   Campylobacter diarrhea 07/16/2022  Carotid arterial disease    a. 09/2023 Carotid U/S: 1-39% bilat ICA stenoses.   Carotid artery disease    Cellulitis of buttock, left 11/17/2018   Chronic edema    CKD (chronic kidney disease) stage 3, GFR 30-59 ml/min (HCC)    [name]   CLL (chronic lymphocytic leukemia) (HCC)    Colon polyps    Cough 04/28/2019   Dysphagia    EGD - reflux esophagitis, one area of stomach with focal intestinal metaplasia (06/2019) Dr Unk   Generalized headaches     frequent   GI bleed 07/08/2017   Glaucoma    s/p laser surgery   HLD (hyperlipidemia)    Hyperkalemia 02/28/2018   Hypertension    Hypothyroidism 10/10/2011   Iron  deficiency anemia due to chronic blood loss    Mitral regurgitation    a. 10/2013 Echo: EF 50-55%, mild LVH, mild MR, mildly dil LA.   Squamous cell carcinoma of skin 06/15/2019   Right posterior ear. SCCis, hypertrophic, crusted   Squamous cell carcinoma of skin 09/26/2022   Left mid helix, mohs 10/31/22. Moderately differentiated, adenoid variant, crusted, deep margin involved.   Sternal fracture 09/13/2017   Thrombocytopenia 10/11/2011   Type 2 diabetes with nephropathy [facility])    DM refresher course [facility] (04/2013)   Past Surgical History:  Past Surgical History:  Procedure Laterality Date   BACK SURGERY     cervical neck   CATARACT EXTRACTION, BILATERAL Bilateral 2017   COLONOSCOPY  11/2008   1 polyp, diverticulosis, rec rpt 5 yrs (Dr. Celestia, Margarete)   COLONOSCOPY  06/2014   hyperplastic polyp, rpt 5 yrs [facility])   COLONOSCOPY WITH PROPOFOL  N/A 11/17/2017   TA, HP, (Vanga, Corinn Skiff, MD)   ESOPHAGOGASTRODUODENOSCOPY (EGD) WITH PROPOFOL  N/A 11/17/2017   healing erosive gastritis, intestinal metaplasia, neg H pylori (Vanga, Corinn Skiff, MD)   ESOPHAGOGASTRODUODENOSCOPY (EGD) WITH PROPOFOL  N/A 06/28/2019   erosive gastropathy with stigmata of recent bleed, granular tissue in esophagus biopsied- reflux esophagitis, small HH (Vanga, Corinn Skiff, MD)   EYE SURGERY  2012   laser surgery for glaucoma   MOHS SURGERY Left 10/2022   L ear mid helix squamous cell cancer   PORTACATH PLACEMENT Left 12/09/2018   Procedure: INSERTION PORT-A-CATH;  Surgeon: Desiderio Schanz, MD;  Location: [facility] ORS;  Service: General;  Laterality: Left;   PTCA  1994, 1995   US  ECHOCARDIOGRAPHY  10/2013   inferior wall hypokinesis, mild LVH, EF 50-55%, mild MR and LA dilation    Social History:  reports that he quit smoking about 45  years ago. His smoking use included cigarettes. He has been exposed to tobacco smoke. He has never used smokeless tobacco. He reports that he does not drink alcohol and does not use drugs. Family History:  Family History  Problem Relation Age of Onset   Diabetes Sister    Stomach cancer Sister 56   Pneumonia Father        caused death   Other Brother        no communication with brother so unsure of any health conditions   Coronary artery disease Son 68       5v CABG and stents   Hyperlipidemia Sister    Stroke Neg Hx    Heart attack Neg Hx      HOME MEDICATIONS: Allergies as of 02/11/2024       Reactions   Lisinopril  Other (See Comments)   Hyperkalemia even at 5mg  dose   Prednisone  Other (See Comments)  Marked symptomatic hyperglycemia        Medication List        Accurate as of February 11, 2024 10:02 AM. If you have any questions, ask your nurse or doctor.          acetaminophen  650 MG CR tablet Commonly known as: TYLENOL  Take 1,300 mg by mouth 2 (two) times daily.   amLODipine  10 MG tablet Commonly known as: NORVASC  Take 1 tablet (10 mg total) by mouth daily.   atorvastatin  80 MG tablet Commonly known as: LIPITOR  TAKE ONE TABLET BY MOUTH ON MONDAY, WEDNESDAY, AND FRIDAY   Basaglar  KwikPen 100 UNIT/ML Inject 10 Units into the skin daily.   carvedilol  6.25 MG tablet Commonly known as: COREG  Take 1 tablet (6.25 mg total) by mouth 2 (two) times daily with a meal.   ezetimibe  10 MG tablet Commonly known as: ZETIA  Take 1 tablet (10 mg total) by mouth daily.   FreeStyle Libre 2 Sensor Misc 1 each by Does not apply route every 14 (fourteen) days.   gabapentin  100 MG capsule Commonly known as: NEURONTIN  Take 1 capsule by mouth at bedtime   Insulin  Pen Needle 32G X 4 MM Misc 1 Device by Does not apply route in the morning, at noon, in the evening, and at bedtime.   levothyroxine  88 MCG tablet Commonly known as: SYNTHROID  Take 1 tablet (88 mcg  total) by mouth daily.   lipase/protease/amylase 63999 UNITS Cpep capsule Commonly known as: Creon  Take 2 capsules with the first bite of each meal and 1 capsule with the first bite of each snack   losartan 50 MG tablet Commonly known as: COZAAR Take 1 tablet (50 mg total) by mouth daily.   meclizine  12.5 MG tablet Commonly known as: ANTIVERT  TAKE 1 TABLET BY MOUTH TWICE DAILY AS NEEDED FOR  DIZZINESS  (SEDATION  PRECAUTIONS)   nitroGLYCERIN  0.4 MG SL tablet Commonly known as: NITROSTAT  Place 1 tablet (0.4 mg total) under the tongue every 5 (five) minutes as needed for chest pain ((MAX of 3 doses)).   NovoLOG  FlexPen 100 UNIT/ML FlexPen Generic drug: insulin  aspart Max daily 20 units per scale   ondansetron  4 MG disintegrating tablet Commonly known as: ZOFRAN -ODT Take 1 tablet (4 mg total) by mouth every 8 (eight) hours as needed for nausea or vomiting.   oxymetazoline  0.05 % nasal spray Commonly known as: AFRIN Place 1 spray into left nostril daily as needed (nose bleeds).   traMADol  50 MG tablet Commonly known as: ULTRAM  TAKE 1 TABLET BY MOUTH EVERY 8 HOURS AS NEEDED   traZODone  100 MG tablet Commonly known as: DESYREL  Take 1 tablet (100 mg total) by mouth at bedtime as needed. for sleep   triamcinolone  ointment 0.5 % Commonly known as: KENALOG  Apply 1 application topically 2 (two) times daily.   zolpidem  10 MG tablet Commonly known as: AMBIEN  TAKE ONE TABLET BY MOUTH AT BEDTIME AS NEEDED FOR SLEEP for 30         ALLERGIES: Allergies  Allergen Reactions   Lisinopril  Other (See Comments)    Hyperkalemia even at 5mg  dose   Prednisone  Other (See Comments)    Marked symptomatic hyperglycemia     REVIEW OF SYSTEMS: A comprehensive ROS was conducted with the patient and is negative except as per HPI     OBJECTIVE:   VITAL SIGNS: BP (!) 170/110   Ht 5' 11 (1.803 m)   Wt 151 lb (68.5 kg)   BMI 21.06 kg/m  PHYSICAL EXAM:  General: Pt appears well  and is in NAD  Lungs: Clear with good BS bilat   Heart: RRR  Neuro: MS is good with appropriate affect, pt is alert and Ox3    DM foot exam: 08/12/2023  The skin of the feet is without sores or ulcerations, distorted and discolored toenails The pedal pulses are 1+ on right and 1+ on left. The sensation is decreased to a screening 5.07, 10 gram monofilament bilaterally at the left great toe    DATA REVIEWED:  Lab Results  Component Value Date   HGBA1C 7.8 (A) 08/12/2023   HGBA1C 8.5 (A) 04/09/2023   HGBA1C 8.2 (A) 12/09/2022    Latest Reference Range & Units 10/20/23 09:55  Sodium 135 - 145 mmol/L 143  Potassium 3.5 - 5.1 mmol/L 4.2  Chloride 98 - 111 mmol/L 105  CO2 22 - 32 mmol/L 26  Glucose 70 - 99 mg/dL 769 (H)  BUN 8 - 23 mg/dL 29 (H)  Creatinine 9.38 - 1.24 mg/dL 8.40 (H)  Calcium  8.9 - 10.3 mg/dL 9.5  Anion gap 5 - 15  12  GFR, Estimated >60 mL/min 43 (L)  (H): Data is abnormally high (L): Data is abnormally low  ASSESSMENT / PLAN / RECOMMENDATIONS:   1) Type 2 Diabetes Mellitus, poorly controlled, With CKD III , neuropathic and macrovascular complications - Most recent A1c of 8.9 %. Goal A1c < 7.5 %.    -Patient has been noted worsening glycemic control, this is due to inconsistency with prandial insulin  intake.  The patient states that when he take NovoLog  5 units he does have hypoglycemic episodes, patient with severe fear of hypoglycemia, no hypoglycemic episodes over the past 2 weeks, but I do suspect that his haphazard intake of insulin  increases his risk of hypoglycemia.  For example, this morning his BG was 200 MGs/DL, he did not eat breakfast but he took 2 units of NovoLog .  Patient also claims that the correction scale causes hypoglycemia, but today he did not use a correction scale which would have recommended 1 unit of NovoLog  but rather he took more than prescribed at 3 units. -SGLT2 inhibitors have been cost prohibitive -With a low BMI of 20, I would avoid  any medications that will facilitate further weight loss -I again encouraged the patient to follow instructions and to take NovoLog  5 units when he is getting ready to eat the meal, I also have asked him to use a correction scale before each meal, and he may use the correction scale without eating a meal - I will increase his Basaglar  due to persistently elevated fasting BGs - I will also change his sensitivity factor from 60 to 65    MEDICATIONS: Increase Basaglar  12 units daily Take NovoLog  5 units with breakfast and with supper Change correction factor: NovoLog  (BG -130/65) TIDQAC    EDUCATION / INSTRUCTIONS: BG monitoring instructions: Patient is instructed to check his blood sugars 3 times a day, before meals. Call [facility] if: BG persistently < 70  I reviewed the Rule of 15 for the treatment of hypoglycemia in detail with the patient. Literature supplied.   2) Diabetic complications:  Eye: Does not have known diabetic retinopathy.  Neuro/ Feet: Does  have known diabetic peripheral neuropathy. Renal: Patient does  have known baseline CKD. He is not on an ACEI/ARB at present.patient is intolerant to lisinopril     3) Peripheral neuropathy:  -Neuropathic symptoms have improved with gabapentin  -Will continue  Medication  Continue Gabapentin  100 mg at bedtime   Follow-up in 4 months      Signed electronically by: Stefano Redgie Butts, MD  [facility]  [facility] [address] [place] [place], [place] [address] Phone: [phone] FAX: [phone]   CC: Rilla Baller, MD [address] [place] [address] Phone: [phone]  Fax: [phone]    Return to Endocrinology clinic as below: Future Appointments  Date Time Provider Department Center  02/11/2024 10:10 AM Alexxia Stankiewicz, Donell Redgie, MD [facility] None  03/17/2024  9:30 AM Celestia Rima, NP [facility] None  04/16/2024 10:30 AM  Rilla Baller, MD [facility] [address]  04/20/2024 11:00 AM Claudene Lehmann, MD [facility] None  05/20/2024  1:30 PM [facility] None  05/20/2024  2:00 PM Jacobo Evalene PARAS, MD [facility] None

## 2024-02-11 NOTE — Telephone Encounter (Signed)
 Pt does not have lymphedema that I'm aware of. If he wants lymphedema leg wrap, recommend office visit to review.  Forms placed in CMA box

## 2024-02-11 NOTE — Patient Instructions (Addendum)
 Increase Basaglar  12 units daily  Novolog  5 units with each meals  Novolog  correctional insulin : ADD extra units on insulin  to your meal-time Novolog  dose if your blood sugars are higher than 195. Use the scale below to help guide you:   Blood sugar before meal Number of units to inject  Less than 195 0 unit  196 - 260 1 units  261 - 326 2 units  327 - 390 3 units  391 - 455 4 units      HOW TO TREAT LOW BLOOD SUGARS (Blood sugar LESS THAN 70 MG/DL) Please follow the RULE OF 15 for the treatment of hypoglycemia treatment (when your (blood sugars are less than 70 mg/dL)   STEP 1: Take 15 grams of carbohydrates when your blood sugar is low, which includes:  3-4 GLUCOSE TABS  OR 3-4 OZ OF JUICE OR REGULAR SODA OR ONE TUBE OF GLUCOSE GEL    STEP 2: RECHECK blood sugar in 15 MINUTES STEP 3: If your blood sugar is still low at the 15 minute recheck --> then, go back to STEP 1 and treat AGAIN with another 15 grams of carbohydrates.

## 2024-02-12 ENCOUNTER — Telehealth: Payer: Self-pay

## 2024-02-12 MED ORDER — FREESTYLE LIBRE 2 SENSOR MISC: 1.0000 | 3 refills | Status: AC

## 2024-02-12 NOTE — Telephone Encounter (Signed)
 Scott [name] called to request freestyle libre refill be sent to [facility].  Refill sent.

## 2024-02-12 NOTE — Addendum Note (Signed)
 Addended by: Lynesha Bango M on: 02/12/2024 11:52 AM   Modules accepted: Orders

## 2024-02-12 NOTE — Telephone Encounter (Signed)
 Patient family called with some questions.  Attempted to call back but no answer.  Message left.

## 2024-02-13 NOTE — Telephone Encounter (Unsigned)
 Copied from CRM #[id number]. Topic: General - Other >> Feb 13, 2024 10:32 AM Berneda FALCON wrote: Reason for CRM: Hope from [facility] calling to check on the status of the order for the leg wrap. I let her know per the CRM notes that the PCP did not believe the patient had lymphedema and states that if he wants the order, he would need an appt in the office to be evaluated for this and then we can continue with the order. At this time, the patient was called and declined the appt and stated he did not wish to continue with the order. I let Hope know this information and she states she will call the patient and see what he would like to do.

## 2024-02-20 ENCOUNTER — Telehealth: Payer: Self-pay

## 2024-02-20 DIAGNOSIS — N1832 Chronic kidney disease, stage 3b: Secondary | ICD-10-CM

## 2024-02-20 DIAGNOSIS — I10 Essential (primary) hypertension: Secondary | ICD-10-CM

## 2024-02-20 NOTE — Telephone Encounter (Signed)
 Patient brought bp log. Placed in box for review.

## 2024-02-23 MED ORDER — LOSARTAN POTASSIUM 100 MG PO TABS: 100.0000 mg | ORAL_TABLET | Freq: Every day | ORAL | 3 refills | Status: DC

## 2024-02-23 MED ORDER — LOSARTAN POTASSIUM 100 MG PO TABS: 100.0000 mg | ORAL_TABLET | Freq: Every day | ORAL | 1 refills | Status: DC

## 2024-02-23 NOTE — Telephone Encounter (Signed)
 BP log reviewed from this past week (12/12-18/2025) - readings averaging too high.   136-190/60-90s, HR 50-60s.   This is despite losartan  50mg  daily, amlodipine  10mg  daily, carvedilol  6.25mg  bid. Losartan  has been through his nephrologist.  Recommend increase losartan  to 100mg  daily. May take 2 tablets until he runs out, new dose will be at pharmacy. Recommend labs followed by OV in 1 wk after he starts higher losartan  dose. He should also let nephrologist know of planned med changes.

## 2024-02-23 NOTE — Telephone Encounter (Signed)
 Called patient reviewed all information and repeated back to me. Said he'll start the 2 tablets daily starting tomorrow, 02/24/2024. Will call if any questions or concerns.  Got him scheduled for 03/05/2024 at 10am

## 2024-02-23 NOTE — Addendum Note (Signed)
 Addended by: RILLA BALLER on: 02/23/2024 01:36 PM   Modules accepted: Orders

## 2024-03-03 ENCOUNTER — Encounter: Payer: Self-pay | Admitting: Oncology

## 2024-03-05 ENCOUNTER — Encounter: Payer: Self-pay | Admitting: Family Medicine

## 2024-03-05 ENCOUNTER — Ambulatory Visit: Admitting: Family Medicine

## 2024-03-05 VITALS — BP 150/80 | HR 56 | Temp 97.5°F | Ht 71.0 in | Wt 159.6 lb

## 2024-03-05 DIAGNOSIS — Z7189 Other specified counseling: Secondary | ICD-10-CM | POA: Diagnosis not present

## 2024-03-05 DIAGNOSIS — I10 Essential (primary) hypertension: Secondary | ICD-10-CM

## 2024-03-05 DIAGNOSIS — N1832 Chronic kidney disease, stage 3b: Secondary | ICD-10-CM

## 2024-03-05 DIAGNOSIS — M25562 Pain in left knee: Secondary | ICD-10-CM

## 2024-03-05 DIAGNOSIS — Z23 Encounter for immunization: Secondary | ICD-10-CM

## 2024-03-05 MED ORDER — AMLODIPINE BESYLATE 10 MG PO TABS: 10.0000 mg | ORAL_TABLET | Freq: Every evening | ORAL | 1 refills | Status: AC

## 2024-03-05 MED ORDER — VALSARTAN 320 MG PO TABS: 320.0000 mg | ORAL_TABLET | Freq: Every day | ORAL | 1 refills | Status: AC

## 2024-03-05 NOTE — Progress Notes (Signed)
 " Ph: [phone] Fax: [phone]   Patient ID: Scott [name], male    DOB: 08-28-1940, 84 y.o.   MRN: [id number]  This visit was conducted in person.  BP (!) 150/80 (BP Location: Left Arm, Patient Position: Sitting, Cuff Size: Normal)   Pulse (!) 56   Temp (!) 97.5 F (36.4 C) (Oral)   Ht 5' 11 (1.803 m)   Wt 159 lb 9.6 oz (72.4 kg)   SpO2 96%   BMI 22.26 kg/m   BP Readings from Last 3 Encounters:  03/05/24 (!) 150/80  02/11/24 (!) 170/110  12/12/23 (!) 102/56   170/80 on repeat testing  CC: HTN f/u visit  Subjective:   HPI: Scott [name] is a 84 y.o. male presenting on 03/05/2024 for Medical Management of Chronic Issues (Pt has bp log, bp have been running high at home/Pt states he feels fine//Would like to discuss POA/)   Brings in advanced directive which will be reviewed.  L ear healing from skin cancer treatment s/p 15 stitches  He regularly sees nephrology, cardiology and endocrinology. Also sees onc and GI and derm.   He has had a few falls with residual left knee pain. No knee locking, no knee instability. Treating with tylenol .   HTN - Compliant with current antihypertensive regimen of amlodipine  10mg  daily, carvedilol  6.25mg  bid, losartan  100mg  daily.  Does check blood pressures at home: and brings log - see below.  No low blood pressure readings or symptoms of dizziness/syncope.  Denies HA, vision changes, CP/tightness, SOB, leg swelling.        Relevant past medical, surgical, family and social history reviewed and updated as indicated. Interim medical history since our last visit reviewed. Allergies and medications reviewed and updated. Outpatient Medications Prior to Visit  Medication Sig Dispense Refill   acetaminophen  (TYLENOL ) 650 MG CR tablet Take 1,300 mg by mouth 2 (two) times daily.     atorvastatin  (LIPITOR ) 80 MG tablet TAKE ONE TABLET BY MOUTH ON MONDAY, WEDNESDAY, AND FRIDAY 39 tablet 3   carvedilol  (COREG ) 6.25 MG tablet Take 1 tablet  (6.25 mg total) by mouth 2 (two) times daily with a meal. 180 tablet 3   Continuous Glucose Sensor (FREESTYLE LIBRE 2 SENSOR) MISC 1 each by Does not apply route every 14 (fourteen) days. 6 each 3   ezetimibe  (ZETIA ) 10 MG tablet Take 1 tablet (10 mg total) by mouth daily. 90 tablet 3   gabapentin  (NEURONTIN ) 100 MG capsule Take 1 capsule by mouth at bedtime 90 capsule 0   insulin  aspart (NOVOLOG  FLEXPEN) 100 UNIT/ML FlexPen Max daily 20 units per scale 15 mL 2   Insulin  Glargine (BASAGLAR  KWIKPEN) 100 UNIT/ML Inject 12 Units into the skin daily. 15 mL 3   Insulin  Pen Needle 32G X 4 MM MISC 1 Device by Does not apply route in the morning, at noon, in the evening, and at bedtime. 400 each 3   levothyroxine  (SYNTHROID ) 88 MCG tablet Take 1 tablet (88 mcg total) by mouth daily. 90 tablet 3   lipase/protease/amylase (CREON ) 36000 UNITS CPEP capsule Take 2 capsules with the first bite of each meal and 1 capsule with the first bite of each snack 240 capsule 11   meclizine  (ANTIVERT ) 12.5 MG tablet TAKE 1 TABLET BY MOUTH TWICE DAILY AS NEEDED FOR  DIZZINESS  (SEDATION  PRECAUTIONS) 20 tablet 0   nitroGLYCERIN  (NITROSTAT ) 0.4 MG SL tablet Place 1 tablet (0.4 mg total) under the tongue every 5 (five) minutes  as needed for chest pain ((MAX of 3 doses)). 25 tablet 1   ondansetron  (ZOFRAN -ODT) 4 MG disintegrating tablet Take 1 tablet (4 mg total) by mouth every 8 (eight) hours as needed for nausea or vomiting. 12 tablet 0   oxymetazoline  (AFRIN) 0.05 % nasal spray Place 1 spray into left nostril daily as needed (nose bleeds).     traMADol  (ULTRAM ) 50 MG tablet TAKE 1 TABLET BY MOUTH EVERY 8 HOURS AS NEEDED 20 tablet 0   traZODone  (DESYREL ) 100 MG tablet Take 1 tablet (100 mg total) by mouth at bedtime as needed. for sleep 90 tablet 4   triamcinolone  ointment (KENALOG ) 0.5 % Apply 1 application topically 2 (two) times daily. 30 g 1   amLODipine  (NORVASC ) 10 MG tablet Take 1 tablet (10 mg total) by mouth daily. 90  tablet 1   losartan  (COZAAR ) 100 MG tablet Take 1 tablet (100 mg total) by mouth daily. 90 tablet 1   zolpidem  (AMBIEN ) 10 MG tablet TAKE ONE TABLET BY MOUTH AT BEDTIME AS NEEDED FOR SLEEP for 30 (Patient not taking: Reported on 03/05/2024)     No facility-administered medications prior to visit.     Per HPI unless specifically indicated in ROS section below Review of Systems  Objective:  BP (!) 150/80 (BP Location: Left Arm, Patient Position: Sitting, Cuff Size: Normal)   Pulse (!) 56   Temp (!) 97.5 F (36.4 C) (Oral)   Ht 5' 11 (1.803 m)   Wt 159 lb 9.6 oz (72.4 kg)   SpO2 96%   BMI 22.26 kg/m   Wt Readings from Last 3 Encounters:  03/05/24 159 lb 9.6 oz (72.4 kg)  02/11/24 151 lb (68.5 kg)  12/12/23 146 lb (66.2 kg)      Physical Exam Vitals and nursing note reviewed.  Constitutional:      Appearance: Normal appearance. He is not ill-appearing.  HENT:     Mouth/Throat:     Mouth: Mucous membranes are moist.     Pharynx: Oropharynx is clear. No oropharyngeal exudate or posterior oropharyngeal erythema.  Eyes:     Extraocular Movements: Extraocular movements intact.     Pupils: Pupils are equal, round, and reactive to light.  Cardiovascular:     Rate and Rhythm: Normal rate and regular rhythm.     Pulses: Normal pulses.     Heart sounds: Normal heart sounds. No murmur heard. Pulmonary:     Effort: Pulmonary effort is normal. No respiratory distress.     Breath sounds: Normal breath sounds. No wheezing, rhonchi or rales.  Musculoskeletal:        General: Swelling and tenderness present.     Right lower leg: Edema (tr) present.     Left lower leg: Edema (tr) present.     Comments:  R knee WNL L knee exam: No deformity on inspection. Discomfort to medial knee  Mild swelling noted. FROM in flex/extension without significant crepitus. No popliteal fullness. Neg mcmurray test. No pain with valgus/varus stress.  Skin:    General: Skin is warm and dry.      Findings: No rash.  Neurological:     Mental Status: He is alert.  Psychiatric:        Mood and Affect: Mood normal.        Behavior: Behavior normal.       Results for orders placed or performed in visit on 02/11/24  POCT glycosylated hemoglobin (Hb A1C)   Collection Time: 02/11/24 10:04 AM  Result Value  Ref Range   Hemoglobin A1C 8.9 (A) 4.0 - 5.6 %   HbA1c POC (<> result, manual entry)     HbA1c, POC (prediabetic range)     HbA1c, POC (controlled diabetic range)     *Note: Due to a large number of results and/or encounters for the requested time period, some results have not been displayed. A complete set of results can be found in Results Review.   Lab Results  Component Value Date   NA 143 10/20/2023   CL 105 10/20/2023   K 4.2 10/20/2023   CO2 26 10/20/2023   BUN 29 (H) 10/20/2023   CREATININE 1.59 (H) 10/20/2023   GFRNONAA 43 (L) 10/20/2023   CALCIUM  9.5 10/20/2023   PHOS 3.8 10/07/2023   ALBUMIN 3.9 10/07/2023   GLUCOSE 230 (H) 10/20/2023   Lab Results  Component Value Date   WBC 4.7 11/21/2023   HGB 10.9 (L) 11/21/2023   HCT 34.3 (L) 11/21/2023   MCV 93.5 11/21/2023   PLT 142 (L) 11/21/2023    Assessment & Plan:  Flu shot today.   Problem List Items Addressed This Visit     Advanced care planning/counseling discussion (Chronic)   Advanced directives - brings copy which was reviewed and sent for scanning. Full code but wouldn't want prolonged life support if terminal condition. HCPOA are [employer] followed by Michelene Marc.        Hypertension - Primary   Chronic, deteriorated despite current regimen of full dose losartan  and amlodipine  and carvedilol  3.125mg  bid (dose titration limited by bradycardia). Predominant morning hypertension noted on BP log - will change amlodipine  to 10mg  at night time and change losartan  to more potent valsartan 320mg  daily. RTC next week lab visit only Cr check. Keep nephrology f/u later this month.       Relevant  Medications   valsartan (DIOVAN) 320 MG tablet   amLODipine  (NORVASC ) 10 MG tablet   CKD (chronic kidney disease) stage 3, GFR 30-59 ml/min [facility])   Appreciate nephrology care Dr Melia - upcoming appt in 10 days.       Left knee pain   Anticipate knee strain after fall. No obvious signs of ligament injury. Supportive measures reviewed, update if ongoing for further evaluation.       Other Visit Diagnoses       Encounter for immunization       Relevant Orders   Flu vaccine HIGH DOSE PF(Fluzone Trivalent) (Completed)        Meds ordered this encounter  Medications   valsartan (DIOVAN) 320 MG tablet    Sig: Take 1 tablet (320 mg total) by mouth daily.    Dispense:  90 tablet    Refill:  1    To replace losartan    amLODipine  (NORVASC ) 10 MG tablet    Sig: Take 1 tablet (10 mg total) by mouth at bedtime.    Dispense:  90 tablet    Refill:  1    Orders Placed This Encounter  Procedures   Flu vaccine HIGH DOSE PF(Fluzone Trivalent)    Patient Instructions  BP is staying too high.  Change losartan  100mg  to valsartan 320mg  daily in the morning.  Change amlodipine  10mg  to night time dosing.   Likely knee strain. Continue tylenol , heating pad, elevate knee, consider trying knee sleeve brace, may use topical voltaren (diclofenac) gel up to 3 times a day to inner side of knee  Keep follow up appointment  Follow up plan: No follow-ups on file.  Anton Blas, MD   "

## 2024-03-05 NOTE — Patient Instructions (Addendum)
 BP is staying too high.  Change losartan  100mg  to valsartan 320mg  daily in the morning.  Change amlodipine  10mg  to night time dosing.   Likely knee strain. Continue tylenol , heating pad, elevate knee, consider trying knee sleeve brace, may use topical voltaren (diclofenac) gel up to 3 times a day to inner side of knee  Keep follow up appointment

## 2024-03-05 NOTE — Assessment & Plan Note (Signed)
 Advanced directives - brings copy which was reviewed and sent for scanning. Full code but wouldn't want prolonged life support if terminal condition. HCPOA are [employer] followed by Michelene Marc.

## 2024-03-05 NOTE — Assessment & Plan Note (Signed)
 Anticipate knee strain after fall. No obvious signs of ligament injury. Supportive measures reviewed, update if ongoing for further evaluation.

## 2024-03-05 NOTE — Assessment & Plan Note (Signed)
 Appreciate nephrology care Dr Melia - upcoming appt in 10 days.

## 2024-03-05 NOTE — Assessment & Plan Note (Signed)
 Chronic, deteriorated despite current regimen of full dose losartan  and amlodipine  and carvedilol  3.125mg  bid (dose titration limited by bradycardia). Predominant morning hypertension noted on BP log - will change amlodipine  to 10mg  at night time and change losartan  to more potent valsartan 320mg  daily. RTC next week lab visit only Cr check. Keep nephrology f/u later this month.

## 2024-03-10 ENCOUNTER — Other Ambulatory Visit

## 2024-03-10 DIAGNOSIS — I10 Essential (primary) hypertension: Secondary | ICD-10-CM | POA: Diagnosis not present

## 2024-03-10 DIAGNOSIS — N1832 Chronic kidney disease, stage 3b: Secondary | ICD-10-CM

## 2024-03-10 LAB — RENAL FUNCTION PANEL
Albumin: 3.8 g/dL (ref 3.5–5.2)
BUN: 35 mg/dL — ABNORMAL HIGH (ref 6–23)
CO2: 31 meq/L (ref 19–32)
Calcium: 9.4 mg/dL (ref 8.4–10.5)
Chloride: 106 meq/L (ref 96–112)
Creatinine, Ser: 1.88 mg/dL — ABNORMAL HIGH (ref 0.40–1.50)
GFR: 32.57 mL/min — ABNORMAL LOW
Glucose, Bld: 180 mg/dL — ABNORMAL HIGH (ref 70–99)
Phosphorus: 4.1 mg/dL (ref 2.3–4.6)
Potassium: 4.4 meq/L (ref 3.5–5.1)
Sodium: 144 meq/L (ref 135–145)

## 2024-03-11 DIAGNOSIS — I259 Chronic ischemic heart disease, unspecified: Secondary | ICD-10-CM | POA: Insufficient documentation

## 2024-03-11 DIAGNOSIS — E119 Type 2 diabetes mellitus without complications: Secondary | ICD-10-CM | POA: Insufficient documentation

## 2024-03-13 ENCOUNTER — Other Ambulatory Visit: Payer: Self-pay | Admitting: Internal Medicine

## 2024-03-15 ENCOUNTER — Ambulatory Visit: Payer: Self-pay | Admitting: Family Medicine

## 2024-03-15 ENCOUNTER — Other Ambulatory Visit: Payer: Self-pay | Admitting: Family Medicine

## 2024-03-15 ENCOUNTER — Telehealth: Payer: Self-pay

## 2024-03-15 DIAGNOSIS — R42 Dizziness and giddiness: Secondary | ICD-10-CM

## 2024-03-15 DIAGNOSIS — G8929 Other chronic pain: Secondary | ICD-10-CM

## 2024-03-15 MED ORDER — BASAGLAR KWIKPEN 100 UNIT/ML ~~LOC~~ SOPN: 12.0000 [IU] | PEN_INJECTOR | Freq: Every day | SUBCUTANEOUS | 3 refills | Status: DC

## 2024-03-15 NOTE — Telephone Encounter (Signed)
 Patient is having trouble getting His basaglar  kwikpen,looks like it has refills but he states he tried to refill it 3 weeks ago and can't   Spoke with [facility] and they had an older dose on file.  New script sent and they will get it processed for patient.  Patient informed.

## 2024-03-15 NOTE — Telephone Encounter (Signed)
 Specimen tracking and history updated from [facility] progress notes of BCC L anterior earlobe. aw

## 2024-03-16 ENCOUNTER — Telehealth: Payer: Self-pay

## 2024-03-16 MED ORDER — LANTUS SOLOSTAR 100 UNIT/ML ~~LOC~~ SOPN: 12.0000 [IU] | PEN_INJECTOR | Freq: Every day | SUBCUTANEOUS | 3 refills | Status: AC

## 2024-03-16 NOTE — Telephone Encounter (Signed)
 Notification from patients insurance that they will not longer cover basaglar  and prefer lantus .  Please advise.

## 2024-03-16 NOTE — Progress Notes (Unsigned)
 "   03/18/2024 Scott [name] [id number] [birth year]  Gastroenterology Office Note     Primary Care Physician:  Rilla Baller, MD  Primary GI Provider: Jinny Carmine, MD    Chief Complaint   Chief Complaint  Patient presents with   New Patient (Initial Visit)   Follow-up    Takes creon  2 in the morning-2 at lunch -if he eats- 2 at dinner- doing well-     History of Present Illness   Scott [name] is a 84 y.o. male with PMHX of EPI presenting today for annual follow up.   Discussed the use of AI scribe software for clinical note transcription with the patient, who gave verbal consent to proceed.  Patient reports he is doing well.  He is taking Creon  two capsules with meals and one with snacks. Bowel movements are regular, occurring daily, and are typically Bristol stool type 3 or 4. No loose stools or diarrhea. Currently able to obtain Creon  at no cost, but is running low and has not received a renewal form. A friend assists with medication paperwork. Denies abdominal pain or gastrointestinal bleeding. Appetite is good with no weight loss, nausea, or vomiting.  Discussed the use of AI scribe software for clinical note transcription with the patient, who gave verbal consent to proceed.  Patient seen my [facility] on 02/11/2023 for annual follow up for exocrine pancreatic insuffiency. Currently taking Creon  36000 lipase units, 2 w/ each meal and 1 w/ each snack, no complaints.   11/17/2017 Colonoscopy - Two 5 to 7 mm polyps in the transverse colon and in the cecum, removed with a hot snare. Resected and retrieved.  - Diverticulosis in the sigmoid colon, in the transverse colon and in the ascending colon. - Increased mucosa vascular pattern in the rectum, prominent rectal veins - Normal mucosa in the entire examined colon. - consider repeat in 5 years based on pathology and overall medical condition at that time.   Past Medical History:  Diagnosis Date   Acquired hand  deformity 1962   hand saw accident at work   Anemia 10/11/2011   Arthritis    Basal cell carcinoma 01/27/2024   Left anterior earlobe. Nodular and infiltrative patterns. Mohs completed 02/20/24   Bilateral renal cysts    a. 09/2023 Renal U/S: bilat complex renal cysts.   Bradycardia 10/10/2011   CAD (coronary artery disease) 10/10/2011   a. 10/2011 MI s/p PTCA (Dx-OM2 proximal concentric stenosis); b. 11/2015 MV: mild apical ischemia. EF 58%; b. 06/2021 MV: Small apical defect, likely attenuation. No ischemia. 3v cor Ca2+. EF 47%.   Campylobacter diarrhea 07/16/2022   Carotid arterial disease    a. 09/2023 Carotid U/S: 1-39% bilat ICA stenoses.   Carotid artery disease    Cellulitis of buttock, left 11/17/2018   Chronic edema    Chronic ischemic heart disease 03/11/2024   CKD (chronic kidney disease) stage 3, GFR 30-59 ml/min (HCC)    [name]   CLL (chronic lymphocytic leukemia) (HCC)    Colon polyps    Cough 04/28/2019   Dysphagia    EGD - reflux esophagitis, one area of stomach with focal intestinal metaplasia (06/2019) Dr Unk   Generalized headaches    frequent   GI bleed 07/08/2017   Glaucoma    s/p laser surgery   HLD (hyperlipidemia)    Hyperkalemia 02/28/2018   Hypertension    Hypothyroidism 10/10/2011   Iron  deficiency anemia due to chronic blood loss    Mitral regurgitation  a. 10/2013 Echo: EF 50-55%, mild LVH, mild MR, mildly dil LA.   Squamous cell carcinoma of skin 06/15/2019   Right posterior ear. SCCis, hypertrophic, crusted   Squamous cell carcinoma of skin 09/26/2022   Left mid helix, mohs 10/31/22. Moderately differentiated, adenoid variant, crusted, deep margin involved.   Sternal fracture 09/13/2017   Thrombocytopenia 10/11/2011   Type 2 diabetes with nephropathy [name])    DM refresher course [facility] (04/2013)   Type II diabetes mellitus (HCC) 03/11/2024    Past Surgical History:  Procedure Laterality Date   BACK SURGERY     cervical neck   CATARACT  EXTRACTION, BILATERAL Bilateral 2017   COLONOSCOPY  11/2008   1 polyp, diverticulosis, rec rpt 5 yrs (Dr. Celestia, Margarete)   COLONOSCOPY  06/2014   hyperplastic polyp, rpt 5 yrs Robertha)   COLONOSCOPY WITH PROPOFOL  N/A 11/17/2017   TA, HP, (Vanga, Corinn Skiff, MD)   ESOPHAGOGASTRODUODENOSCOPY (EGD) WITH PROPOFOL  N/A 11/17/2017   healing erosive gastritis, intestinal metaplasia, neg H pylori (Vanga, Corinn Skiff, MD)   ESOPHAGOGASTRODUODENOSCOPY (EGD) WITH PROPOFOL  N/A 06/28/2019   erosive gastropathy with stigmata of recent bleed, granular tissue in esophagus biopsied- reflux esophagitis, small HH (Vanga, Corinn Skiff, MD)   EYE SURGERY  2012   laser surgery for glaucoma   MOHS SURGERY Left 10/2022   L ear mid helix squamous cell cancer   PORTACATH PLACEMENT Left 12/09/2018   Procedure: INSERTION PORT-A-CATH;  Surgeon: Desiderio Schanz, MD;  Location: [facility] ORS;  Service: General;  Laterality: Left;   PTCA  1994, 1995   US  ECHOCARDIOGRAPHY  10/2013   inferior wall hypokinesis, mild LVH, EF 50-55%, mild MR and LA dilation    Current Outpatient Medications  Medication Sig Dispense Refill   acetaminophen  (TYLENOL ) 650 MG CR tablet Take 1,300 mg by mouth 2 (two) times daily.     amLODipine  (NORVASC ) 10 MG tablet Take 1 tablet (10 mg total) by mouth at bedtime. 90 tablet 1   atorvastatin  (LIPITOR ) 80 MG tablet TAKE ONE TABLET BY MOUTH ON MONDAY, WEDNESDAY, AND FRIDAY 39 tablet 3   carvedilol  (COREG ) 6.25 MG tablet Take 1 tablet (6.25 mg total) by mouth 2 (two) times daily with a meal. 180 tablet 3   Continuous Glucose Sensor (FREESTYLE LIBRE 2 SENSOR) MISC 1 each by Does not apply route every 14 (fourteen) days. 6 each 3   ezetimibe  (ZETIA ) 10 MG tablet Take 1 tablet (10 mg total) by mouth daily. 90 tablet 3   gabapentin  (NEURONTIN ) 100 MG capsule Take 1 capsule by mouth at bedtime 90 capsule 0   insulin  aspart (NOVOLOG  FLEXPEN) 100 UNIT/ML FlexPen Max daily 20 units per scale 15 mL 2    insulin  glargine (LANTUS  SOLOSTAR) 100 UNIT/ML Solostar Pen Inject 12 Units into the skin daily. 15 mL 3   Insulin  Pen Needle 32G X 4 MM MISC 1 Device by Does not apply route in the morning, at noon, in the evening, and at bedtime. 400 each 3   levothyroxine  (SYNTHROID ) 88 MCG tablet Take 1 tablet (88 mcg total) by mouth daily. 90 tablet 3   lipase/protease/amylase (CREON ) 36000 UNITS CPEP capsule Take 2 capsules with the first bite of each meal and 1 capsule with the first bite of each snack 240 capsule 11   meclizine  (ANTIVERT ) 12.5 MG tablet TAKE 1 TABLET BY MOUTH TWICE DAILY AS NEEDED FOR  DIZZINESS  (SEDATION  PRECAUTIONS) 20 tablet 0   nitroGLYCERIN  (NITROSTAT ) 0.4 MG SL tablet Place 1 tablet (  0.4 mg total) under the tongue every 5 (five) minutes as needed for chest pain ((MAX of 3 doses)). 25 tablet 1   ondansetron  (ZOFRAN -ODT) 4 MG disintegrating tablet Take 1 tablet (4 mg total) by mouth every 8 (eight) hours as needed for nausea or vomiting. 12 tablet 0   oxymetazoline  (AFRIN) 0.05 % nasal spray Place 1 spray into left nostril daily as needed (nose bleeds).     traMADol  (ULTRAM ) 50 MG tablet TAKE 1 TABLET BY MOUTH EVERY 8 HOURS AS NEEDED 20 tablet 0   traZODone  (DESYREL ) 100 MG tablet Take 1 tablet (100 mg total) by mouth at bedtime as needed. for sleep 90 tablet 4   triamcinolone  ointment (KENALOG ) 0.5 % Apply 1 application topically 2 (two) times daily. 30 g 1   valsartan  (DIOVAN ) 320 MG tablet Take 1 tablet (320 mg total) by mouth daily. 90 tablet 1   No current facility-administered medications for this visit.    Allergies as of 03/17/2024 - Review Complete 03/17/2024  Allergen Reaction Noted   Lisinopril  Other (See Comments) 05/16/2021   Prednisone  Other (See Comments) 12/10/2021    Family History  Problem Relation Age of Onset   Pneumonia Father        caused death   Diabetes Sister    Stomach cancer Sister 26   Hyperlipidemia Sister    Other Brother        no  communication with brother so unsure of any health conditions   Coronary artery disease Son 61       5v CABG and stents   Heart attack Son 25   Stroke Neg Hx     Social History   Socioeconomic History   Marital status: Widowed    Spouse name: Not on file   Number of children: Not on file   Years of education: Not on file   Highest education level: 12th grade  Occupational History   Not on file  Tobacco Use   Smoking status: Former    Current packs/day: 0.00    Types: Cigarettes    Quit date: 03/04/1978    Years since quitting: 46.0    Passive exposure: Past   Smokeless tobacco: Never  Vaping Use   Vaping status: Never Used  Substance and Sexual Activity   Alcohol use: No   Drug use: No   Sexual activity: Never  Other Topics Concern   Not on file  Social History Narrative   Widower. Wife passed 08/2015 from cancer. 1 dog   Occupation: retired, was route medical illustrator   Edu: 11th gr   Activity: walks dog (pomeranian) 3-4x/day, yardwork, rides bicycle.   Diet: drinks diet coke, no vegetables   Social Drivers of Health   Tobacco Use: Medium Risk (03/17/2024)   Patient History    Smoking Tobacco Use: Former    Smokeless Tobacco Use: Never    Passive Exposure: Past  Physicist, Medical Strain: Low Risk (10/11/2023)   Overall Financial Resource Strain (CARDIA)    Difficulty of Paying Living Expenses: Not very hard  Food Insecurity: No Food Insecurity (10/11/2023)   Epic    Worried About Programme Researcher, Broadcasting/film/video in the Last Year: Never true    Ran Out of Food in the Last Year: Never true  Transportation Needs: No Transportation Needs (10/11/2023)   Epic    Lack of Transportation (Medical): No    Lack of Transportation (Non-Medical): No  Physical Activity: Unknown (10/11/2023)   Exercise Vital Sign    Days  of Exercise per Week: Patient declined    Minutes of Exercise per Session: Not on file  Stress: No Stress Concern Present (05/26/2023)   [employer] of Occupational Health -  Occupational Stress Questionnaire    Feeling of Stress : Not at all  Social Connections: Socially Isolated (10/11/2023)   Social Connection and Isolation Panel    Frequency of Communication with Friends and Family: More than three times a week    Frequency of Social Gatherings with Friends and Family: Twice a week    Attends Religious Services: Never    Database Administrator or Organizations: No    Attends Engineer, Structural: Not on file    Marital Status: Widowed  Intimate Partner Violence: Not At Risk (05/26/2023)   Humiliation, Afraid, Rape, and Kick questionnaire    Fear of Current or Ex-Partner: No    Emotionally Abused: No    Physically Abused: No    Sexually Abused: No  Depression (PHQ2-9): Low Risk (10/14/2023)   Depression (PHQ2-9)    PHQ-2 Score: 0  Alcohol Screen: Low Risk (05/22/2022)   Alcohol Screen    Last Alcohol Screening Score (AUDIT): 0  Housing: Unknown (10/11/2023)   Epic    Unable to Pay for Housing in the Last Year: No    Number of Times Moved in the Last Year: Not on file    Homeless in the Last Year: No  Utilities: Not At Risk (05/26/2023)   [facility] Utilities    Threatened with loss of utilities: No  Health Literacy: Not on file     RELEVANT GI HISTORY, IMAGING AND LABS: CBC    Component Value Date/Time   WBC 4.7 11/21/2023 1131   WBC 4.6 10/20/2023 0955   RBC 3.67 (L) 11/21/2023 1131   HGB 10.9 (L) 11/21/2023 1131   HGB 10.7 (L) 11/05/2016 0831   HGB 12.0 03/06/2011 0000   HCT 34.3 (L) 11/21/2023 1131   HCT 32.0 (L) 11/05/2016 0831   PLT 142 (L) 11/21/2023 1131   PLT 90 (LL) 11/05/2016 0831   MCV 93.5 11/21/2023 1131   MCV 92 11/05/2016 0831   MCH 29.7 11/21/2023 1131   MCHC 31.8 11/21/2023 1131   RDW 12.7 11/21/2023 1131   RDW 13.4 11/05/2016 0831   LYMPHSABS 1.0 11/21/2023 1131   LYMPHSABS 12.5 (H) 11/05/2016 0831   MONOABS 0.3 11/21/2023 1131   EOSABS 0.1 11/21/2023 1131   EOSABS 0.2 11/05/2016 0831   BASOSABS 0.0 11/21/2023 1131    BASOSABS 0.0 11/05/2016 0831   Recent Labs    05/21/23 1017 10/07/23 0950 10/20/23 0955 11/21/23 1131  HGB 12.4* 11.8* 12.4* 10.9*    CMP     Component Value Date/Time   NA 144 03/10/2024 0946   NA 147 (H) 11/05/2016 0831   K 4.4 03/10/2024 0946   K 4.4 09/04/2011 0000   CL 106 03/10/2024 0946   CO2 31 03/10/2024 0946   GLUCOSE 180 (H) 03/10/2024 0946   BUN 35 (H) 03/10/2024 0946   BUN 41 (H) 11/05/2016 0831   CREATININE 1.88 (H) 03/10/2024 0946   CREATININE 1.47 (H) 08/12/2023 1108   CALCIUM  9.4 03/10/2024 0946   PROT 6.1 10/07/2023 0950   PROT 6.4 11/05/2016 0831   ALBUMIN 3.8 03/10/2024 0946   ALBUMIN 4.4 11/05/2016 0831   AST 17 10/07/2023 0950   AST 18 09/04/2011 0000   ALT 11 10/07/2023 0950   ALKPHOS 99 10/07/2023 0950   ALKPHOS 94 09/04/2011 0000   BILITOT  0.4 10/07/2023 0950   BILITOT 0.5 11/05/2016 0831   BILITOT 0.6 09/04/2011 0000   GFRNONAA 43 (L) 10/20/2023 0955   GFRAA 42 (L) 10/28/2019 1007      Latest Ref Rng & Units 03/10/2024    9:46 AM 10/07/2023    9:50 AM 07/13/2022   11:22 AM  Hepatic Function  Total Protein 6.0 - 8.3 g/dL  6.1  7.0   Albumin 3.5 - 5.2 g/dL 3.8  3.9  3.9   AST 0 - 37 U/L  17  25   ALT 0 - 53 U/L  11  19   Alk Phosphatase 39 - 117 U/L  99  84   Total Bilirubin 0.2 - 1.2 mg/dL  0.4  0.8       Review of Systems   All systems reviewed and negative except where noted in HPI.    Physical Exam  BP (!) 153/74   Pulse 62   Temp 97.6 F (36.4 C)   Ht 5' 11 (1.803 m)   Wt 156 lb 3.2 oz (70.9 kg)   SpO2 97%   BMI 21.79 kg/m  No LMP for male patient. General:   Alert and oriented. Pleasant and cooperative. Well-nourished and well-developed. NAD. Head:  Normocephalic and atraumatic. Eyes:  Without icterus Ears:  Normal auditory acuity. Lungs:  Respirations even and unlabored.  Clear throughout to auscultation.   No wheezes, crackles, or rhonchi. No acute distress. Heart:  Regular rate and rhythm; no murmurs, clicks,  rubs, or gallops. Abdomen:  Normal bowel sounds.  No bruits.  Soft, non-tender and non-distended without masses, hepatosplenomegaly or hernias noted.  No guarding or rebound tenderness.   Rectal:  Deferred. Msk:  Symmetrical without gross deformities. Normal posture. Extremities:  Without edema. Neurologic:  Alert and  oriented x4;  grossly normal neurologically. Skin:  Intact without significant lesions or rashes. Psych:  Alert and cooperative. Normal mood and affect.   Assessment & Plan   Scott [name] is a 84 y.o. male presenting today for follow-up.  Exocrine pancreatic insufficiency.  Condition well-controlled with current enzyme therapy. - Continue with current dosage of Creon  36000 lipase units, 2 with meals and 1 with snacks.  - Discussed annual renewal of Creon  prescription and offered assistance with insurance paperwork for continued coverage. - Ordered laboratory tests for fat-soluble vitamins A, E, and K to monitor for deficiencies. Deferred vitamin D  testing due to recent testing in August 2025.    Patient will follow-up in 1 year or sooner if needed for any GI symptoms  Grayce Bohr, DNP, AGNP-C [facility]   "

## 2024-03-16 NOTE — Telephone Encounter (Signed)
 Lantus  sent

## 2024-03-16 NOTE — Telephone Encounter (Signed)
 Requesting: Tramadol   Contract: No UDS:  Last Visit: 03/05/2024 Next Visit: 04/16/2024 Last Refill: 12/11/23  Please Advise   Don't see dizziness on problem list ok to refill meclizine 

## 2024-03-17 ENCOUNTER — Ambulatory Visit: Admitting: Family Medicine

## 2024-03-17 ENCOUNTER — Encounter: Payer: Self-pay | Admitting: Family Medicine

## 2024-03-17 VITALS — BP 153/74 | HR 62 | Temp 97.6°F | Ht 71.0 in | Wt 156.2 lb

## 2024-03-17 DIAGNOSIS — K8681 Exocrine pancreatic insufficiency: Secondary | ICD-10-CM | POA: Diagnosis not present

## 2024-03-17 NOTE — Telephone Encounter (Signed)
 ERx

## 2024-03-18 ENCOUNTER — Other Ambulatory Visit: Payer: Self-pay | Admitting: Nephrology

## 2024-03-18 DIAGNOSIS — N189 Chronic kidney disease, unspecified: Secondary | ICD-10-CM

## 2024-03-19 LAB — VITAMIN K1, SERUM: VITAMIN K1: 0.85 ng/mL (ref 0.10–2.20)

## 2024-03-19 LAB — VITAMIN E
Vitamin E (Alpha Tocopherol): 10.5 mg/L (ref 9.0–29.0)
Vitamin E(Gamma Tocopherol): 1.3 mg/L (ref 0.5–4.9)

## 2024-03-19 LAB — VITAMIN A: Vitamin A: 50.7 ug/dL (ref 22.0–69.5)

## 2024-03-19 MED ORDER — PANCRELIPASE (LIP-PROT-AMYL) 36000-114000 UNITS PO CPEP: ORAL_CAPSULE | ORAL | 11 refills | Status: AC

## 2024-03-19 NOTE — Addendum Note (Signed)
 Addended by: CELESTIA RIMA on: 03/19/2024 12:09 PM   Modules accepted: Orders

## 2024-03-21 ENCOUNTER — Ambulatory Visit: Payer: Self-pay | Admitting: Family Medicine

## 2024-03-23 ENCOUNTER — Telehealth: Payer: Self-pay

## 2024-03-23 NOTE — Telephone Encounter (Signed)
 Received from patient assistance Creon  -approved  through 03/03/2025. Patient notified.Letter placed to be scanned into chart.

## 2024-03-25 ENCOUNTER — Ambulatory Visit
Admission: RE | Admit: 2024-03-25 | Discharge: 2024-03-25 | Disposition: A | Discharge status: Home / Self Care | Source: Ambulatory Visit | Attending: Nephrology | Admitting: Nephrology

## 2024-03-25 DIAGNOSIS — N189 Chronic kidney disease, unspecified: Secondary | ICD-10-CM

## 2024-03-26 ENCOUNTER — Telehealth: Payer: Self-pay | Admitting: Family Medicine

## 2024-03-26 NOTE — Telephone Encounter (Unsigned)
 Copied from CRM [id number]. Topic: Clinical - Order For Equipment >> Mar 26, 2024 12:33 PM Deaijah H wrote: Reason for CRM: Rose w/ [employer] called in to check if fax was received yesterday 1/22 please call to confirm [phone] ask for [facility]

## 2024-03-26 NOTE — Telephone Encounter (Signed)
 Copied from CRM #[id number]. Topic: Clinical - Order For Equipment >> Mar 25, 2024  3:29 PM Donna BRAVO wrote: Reason for CRM:a prior auth was  faxed on 03/24/24 for prior auth for back and braces for both knee Rose will refax the form 03/25/24

## 2024-03-29 NOTE — Telephone Encounter (Signed)
 [employer] called in today again to see if fax was received by office for back and nee braces for patient.

## 2024-03-31 ENCOUNTER — Telehealth: Payer: Self-pay

## 2024-03-31 NOTE — Telephone Encounter (Signed)
 Patient stopped by office today-stated he received his creon  and also said he received an email from [employer] that additional information was needed- I told him I would check on this-  I spoke with Kellie -they needed to know if patient was taking another medication other than Creon -I called patient to let him know he does not need to do anything-he should receive his medication every month until renewal.

## 2024-03-31 NOTE — Telephone Encounter (Signed)
 Copied from CRM [id number]. Topic: Clinical - Order For Equipment >> Mar 26, 2024 12:33 PM Deaijah H wrote: Reason for CRM: Rose w/ [employer] called in to check if fax was received yesterday 1/22 please call to confirm [phone] ask for [facility] >> Mar 31, 2024 10:34 AM Roselie BROCKS wrote: [facility] with [employer] calling for status update on a fax for prior authorization for back and knee braces for the patient , please return call to [phone]

## 2024-04-01 ENCOUNTER — Telehealth: Payer: Self-pay | Admitting: Family Medicine

## 2024-04-01 NOTE — Telephone Encounter (Unsigned)
 Copied from CRM [id number]. Topic: Clinical - Order For Equipment >> Mar 26, 2024 12:33 PM Deaijah H wrote: Reason for CRM: Rose w/ [employer] called in to check if fax was received yesterday 1/22 please call to confirm [phone] ask for [facility] >> Apr 01, 2024  9:14 AM Adelita BRAVO wrote: [facility] calling back to see if fax was received for back and knee braces for the patient. Callback number [phone], ask for [facility]. >> Mar 31, 2024 10:34 AM Roselie BROCKS wrote: [facility] with [employer] calling for status update on a fax for prior authorization for back and knee braces for the patient , please return call to [phone]

## 2024-04-01 NOTE — Telephone Encounter (Signed)
 Spoke to patient he did not want to have braces ordered. If he has any knee pain or issues he will call office and have evaluated.

## 2024-04-01 NOTE — Telephone Encounter (Signed)
 Per patient call he declined brace at this time.

## 2024-04-01 NOTE — Telephone Encounter (Signed)
 Copied from CRM #[id number]. Topic: General - Other >> Apr 01, 2024  3:16 PM Roselie BROCKS wrote: Reason for CRM: Rose from [employer] called for status update on fax that was sent in. Per messages, the patient has decided he will decline the brace at this time. Rose understood.

## 2024-04-02 NOTE — Telephone Encounter (Signed)
 Duplicate. See other telephone encounter.

## 2024-04-02 NOTE — Telephone Encounter (Addendum)
 Please notify [employer] that patient doesn't want this and to please stop contacting us  about it.  They have called multiple times about this.

## 2024-04-02 NOTE — Telephone Encounter (Signed)
 Noted.

## 2024-04-02 NOTE — Telephone Encounter (Signed)
 Attempted to [employer]/designer Noemi). Not successful, as the menu options weren't going to through to customer service representative.

## 2024-04-07 NOTE — Telephone Encounter (Signed)
 Contacted company and formally declined and requested to not be contact regarding this again

## 2024-04-16 ENCOUNTER — Ambulatory Visit: Admitting: Family Medicine

## 2024-04-20 ENCOUNTER — Ambulatory Visit: Admitting: Dermatology

## 2024-05-20 ENCOUNTER — Ambulatory Visit: Admitting: Oncology

## 2024-05-20 ENCOUNTER — Other Ambulatory Visit

## 2024-08-11 ENCOUNTER — Ambulatory Visit: Admitting: Internal Medicine

## 2025-03-16 ENCOUNTER — Ambulatory Visit: Admitting: Family Medicine
